# Patient Record
Sex: Male | Born: 1955 | Race: White | Hispanic: No | Marital: Married | Smoking: Former smoker
Health system: Southern US, Community
[De-identification: ages and names within clinical notes are randomized; demographics above are authoritative.]

## PROBLEM LIST (undated history)

## (undated) DIAGNOSIS — R112 Nausea with vomiting, unspecified: Secondary | ICD-10-CM

## (undated) DIAGNOSIS — F5104 Psychophysiologic insomnia: Secondary | ICD-10-CM

## (undated) DIAGNOSIS — J209 Acute bronchitis, unspecified: Secondary | ICD-10-CM

## (undated) DIAGNOSIS — Z9889 Other specified postprocedural states: Secondary | ICD-10-CM

## (undated) DIAGNOSIS — C3490 Malignant neoplasm of unspecified part of unspecified bronchus or lung: Secondary | ICD-10-CM

## (undated) DIAGNOSIS — Z923 Personal history of irradiation: Secondary | ICD-10-CM

## (undated) DIAGNOSIS — F32A Depression, unspecified: Secondary | ICD-10-CM

## (undated) DIAGNOSIS — F329 Major depressive disorder, single episode, unspecified: Secondary | ICD-10-CM

## (undated) DIAGNOSIS — F419 Anxiety disorder, unspecified: Secondary | ICD-10-CM

## (undated) DIAGNOSIS — T50905A Adverse effect of unspecified drugs, medicaments and biological substances, initial encounter: Secondary | ICD-10-CM

## (undated) DIAGNOSIS — J449 Chronic obstructive pulmonary disease, unspecified: Secondary | ICD-10-CM

## (undated) DIAGNOSIS — IMO0002 Reserved for concepts with insufficient information to code with codable children: Secondary | ICD-10-CM

## (undated) DIAGNOSIS — Z9289 Personal history of other medical treatment: Secondary | ICD-10-CM

## (undated) DIAGNOSIS — G4733 Obstructive sleep apnea (adult) (pediatric): Secondary | ICD-10-CM

## (undated) DIAGNOSIS — I2510 Atherosclerotic heart disease of native coronary artery without angina pectoris: Secondary | ICD-10-CM

## (undated) DIAGNOSIS — I10 Essential (primary) hypertension: Secondary | ICD-10-CM

## (undated) DIAGNOSIS — L298 Other pruritus: Secondary | ICD-10-CM

## (undated) DIAGNOSIS — E785 Hyperlipidemia, unspecified: Secondary | ICD-10-CM

## (undated) DIAGNOSIS — I739 Peripheral vascular disease, unspecified: Secondary | ICD-10-CM

## (undated) DIAGNOSIS — K219 Gastro-esophageal reflux disease without esophagitis: Secondary | ICD-10-CM

## (undated) DIAGNOSIS — E119 Type 2 diabetes mellitus without complications: Secondary | ICD-10-CM

## (undated) HISTORY — PX: BACK SURGERY: SHX140

## (undated) HISTORY — DX: Depression, unspecified: F32.A

## (undated) HISTORY — DX: Anxiety disorder, unspecified: F41.9

## (undated) HISTORY — DX: Obstructive sleep apnea (adult) (pediatric): G47.33

## (undated) HISTORY — DX: Gastro-esophageal reflux disease without esophagitis: K21.9

## (undated) HISTORY — DX: Major depressive disorder, single episode, unspecified: F32.9

## (undated) HISTORY — DX: Nausea with vomiting, unspecified: R11.2

## (undated) HISTORY — DX: Type 2 diabetes mellitus without complications: E11.9

## (undated) HISTORY — PX: OTHER SURGICAL HISTORY: SHX169

## (undated) HISTORY — DX: Atherosclerotic heart disease of native coronary artery without angina pectoris: I25.10

## (undated) HISTORY — DX: Chronic obstructive pulmonary disease, unspecified: J44.9

## (undated) HISTORY — DX: Personal history of other medical treatment: Z92.89

## (undated) HISTORY — DX: Adverse effect of unspecified drugs, medicaments and biological substances, initial encounter: T50.905A

## (undated) HISTORY — DX: Peripheral vascular disease, unspecified: I73.9

## (undated) HISTORY — PX: SHOULDER SURGERY: SHX246

## (undated) HISTORY — DX: Other pruritus: L29.8

## (undated) HISTORY — DX: Reserved for concepts with insufficient information to code with codable children: IMO0002

## (undated) HISTORY — DX: Psychophysiologic insomnia: F51.04

## (undated) HISTORY — DX: Other specified postprocedural states: Z98.890

## (undated) HISTORY — DX: Acute bronchitis, unspecified: J20.9

## (undated) HISTORY — DX: Hyperlipidemia, unspecified: E78.5

## (undated) HISTORY — DX: Personal history of irradiation: Z92.3

## (undated) HISTORY — PX: ANGIOPLASTY: SHX39

## (undated) HISTORY — PX: ANTERIOR FUSION CERVICAL SPINE: SUR626

## (undated) HISTORY — PX: HIP SURGERY: SHX245

## (undated) HISTORY — PX: COLON SURGERY: SHX602

---

## 1998-07-07 ENCOUNTER — Ambulatory Visit (HOSPITAL_COMMUNITY): Admission: RE | Admit: 1998-07-07 | Discharge: 1998-07-07 | Payer: Self-pay | Admitting: Endocrinology

## 1998-07-12 ENCOUNTER — Ambulatory Visit (HOSPITAL_COMMUNITY): Admission: RE | Admit: 1998-07-12 | Discharge: 1998-07-12 | Payer: Self-pay | Admitting: Endocrinology

## 1999-01-18 ENCOUNTER — Encounter: Payer: Self-pay | Admitting: Endocrinology

## 1999-01-18 ENCOUNTER — Ambulatory Visit (HOSPITAL_COMMUNITY): Admission: RE | Admit: 1999-01-18 | Discharge: 1999-01-18 | Payer: Self-pay | Admitting: Endocrinology

## 1999-02-20 ENCOUNTER — Encounter: Payer: Self-pay | Admitting: Emergency Medicine

## 1999-02-20 ENCOUNTER — Emergency Department (HOSPITAL_COMMUNITY): Admission: EM | Admit: 1999-02-20 | Discharge: 1999-02-20 | Payer: Self-pay | Admitting: Emergency Medicine

## 1999-05-01 ENCOUNTER — Ambulatory Visit (HOSPITAL_COMMUNITY): Admission: RE | Admit: 1999-05-01 | Discharge: 1999-05-01 | Payer: Self-pay | Admitting: Gastroenterology

## 1999-07-04 ENCOUNTER — Encounter: Payer: Self-pay | Admitting: Gastroenterology

## 1999-07-04 ENCOUNTER — Ambulatory Visit (HOSPITAL_COMMUNITY): Admission: RE | Admit: 1999-07-04 | Discharge: 1999-07-04 | Payer: Self-pay | Admitting: Gastroenterology

## 1999-07-10 ENCOUNTER — Ambulatory Visit (HOSPITAL_COMMUNITY): Admission: RE | Admit: 1999-07-10 | Discharge: 1999-07-10 | Payer: Self-pay | Admitting: Gastroenterology

## 1999-07-10 ENCOUNTER — Encounter: Payer: Self-pay | Admitting: Gastroenterology

## 2000-03-05 ENCOUNTER — Ambulatory Visit (HOSPITAL_COMMUNITY): Admission: RE | Admit: 2000-03-05 | Discharge: 2000-03-05 | Payer: Self-pay | Admitting: Gastroenterology

## 2000-03-05 ENCOUNTER — Encounter: Payer: Self-pay | Admitting: Gastroenterology

## 2001-05-27 ENCOUNTER — Ambulatory Visit (HOSPITAL_COMMUNITY): Admission: RE | Admit: 2001-05-27 | Discharge: 2001-05-27 | Payer: Self-pay | Admitting: Gastroenterology

## 2001-05-29 ENCOUNTER — Ambulatory Visit (HOSPITAL_COMMUNITY): Admission: RE | Admit: 2001-05-29 | Discharge: 2001-05-29 | Payer: Self-pay | Admitting: Gastroenterology

## 2001-05-29 ENCOUNTER — Encounter: Payer: Self-pay | Admitting: Gastroenterology

## 2001-12-23 ENCOUNTER — Emergency Department (HOSPITAL_COMMUNITY): Admission: EM | Admit: 2001-12-23 | Discharge: 2001-12-23 | Payer: Self-pay | Admitting: Emergency Medicine

## 2002-03-30 ENCOUNTER — Encounter: Payer: Self-pay | Admitting: Gastroenterology

## 2002-03-30 ENCOUNTER — Encounter: Admission: RE | Admit: 2002-03-30 | Discharge: 2002-03-30 | Payer: Self-pay | Admitting: Gastroenterology

## 2002-04-13 ENCOUNTER — Ambulatory Visit (HOSPITAL_COMMUNITY): Admission: RE | Admit: 2002-04-13 | Discharge: 2002-04-13 | Payer: Self-pay | Admitting: Gastroenterology

## 2002-05-07 ENCOUNTER — Inpatient Hospital Stay (HOSPITAL_COMMUNITY): Admission: RE | Admit: 2002-05-07 | Discharge: 2002-05-11 | Payer: Self-pay

## 2002-05-07 ENCOUNTER — Encounter (INDEPENDENT_AMBULATORY_CARE_PROVIDER_SITE_OTHER): Payer: Self-pay | Admitting: Specialist

## 2002-09-29 ENCOUNTER — Emergency Department (HOSPITAL_COMMUNITY): Admission: EM | Admit: 2002-09-29 | Discharge: 2002-09-29 | Payer: Self-pay | Admitting: Emergency Medicine

## 2003-09-13 ENCOUNTER — Ambulatory Visit (HOSPITAL_COMMUNITY): Admission: RE | Admit: 2003-09-13 | Discharge: 2003-09-13 | Payer: Self-pay | Admitting: Internal Medicine

## 2003-09-13 ENCOUNTER — Encounter: Payer: Self-pay | Admitting: Internal Medicine

## 2003-09-14 ENCOUNTER — Emergency Department (HOSPITAL_COMMUNITY): Admission: EM | Admit: 2003-09-14 | Discharge: 2003-09-14 | Payer: Self-pay | Admitting: Emergency Medicine

## 2003-10-10 ENCOUNTER — Ambulatory Visit (HOSPITAL_COMMUNITY): Admission: RE | Admit: 2003-10-10 | Discharge: 2003-10-11 | Payer: Self-pay | Admitting: Neurological Surgery

## 2004-10-04 ENCOUNTER — Ambulatory Visit: Payer: Self-pay | Admitting: Gastroenterology

## 2004-10-06 ENCOUNTER — Ambulatory Visit (HOSPITAL_COMMUNITY): Admission: RE | Admit: 2004-10-06 | Discharge: 2004-10-06 | Payer: Self-pay | Admitting: Neurological Surgery

## 2004-10-12 ENCOUNTER — Encounter (INDEPENDENT_AMBULATORY_CARE_PROVIDER_SITE_OTHER): Payer: Self-pay | Admitting: *Deleted

## 2004-10-12 ENCOUNTER — Ambulatory Visit (HOSPITAL_COMMUNITY): Admission: RE | Admit: 2004-10-12 | Discharge: 2004-10-12 | Payer: Self-pay | Admitting: Gastroenterology

## 2004-10-12 ENCOUNTER — Ambulatory Visit: Payer: Self-pay | Admitting: Gastroenterology

## 2005-03-05 ENCOUNTER — Ambulatory Visit: Payer: Self-pay | Admitting: Internal Medicine

## 2005-03-13 ENCOUNTER — Ambulatory Visit: Payer: Self-pay | Admitting: Endocrinology

## 2005-03-20 ENCOUNTER — Ambulatory Visit: Payer: Self-pay | Admitting: Endocrinology

## 2005-03-27 ENCOUNTER — Ambulatory Visit: Payer: Self-pay | Admitting: Endocrinology

## 2005-06-14 ENCOUNTER — Ambulatory Visit: Payer: Self-pay | Admitting: Endocrinology

## 2006-02-10 ENCOUNTER — Emergency Department (HOSPITAL_COMMUNITY): Admission: EM | Admit: 2006-02-10 | Discharge: 2006-02-10 | Payer: Self-pay | Admitting: Emergency Medicine

## 2006-02-14 ENCOUNTER — Ambulatory Visit: Payer: Self-pay | Admitting: Cardiology

## 2006-02-17 ENCOUNTER — Inpatient Hospital Stay (HOSPITAL_BASED_OUTPATIENT_CLINIC_OR_DEPARTMENT_OTHER): Admission: RE | Admit: 2006-02-17 | Discharge: 2006-02-17 | Payer: Self-pay | Admitting: Cardiovascular Disease

## 2006-02-17 ENCOUNTER — Ambulatory Visit: Payer: Self-pay | Admitting: Cardiovascular Disease

## 2006-02-20 ENCOUNTER — Ambulatory Visit: Payer: Self-pay | Admitting: Cardiology

## 2006-02-20 ENCOUNTER — Ambulatory Visit (HOSPITAL_COMMUNITY): Admission: RE | Admit: 2006-02-20 | Discharge: 2006-02-20 | Payer: Self-pay | Admitting: Cardiology

## 2006-02-26 ENCOUNTER — Ambulatory Visit: Payer: Self-pay | Admitting: Cardiology

## 2006-03-03 ENCOUNTER — Ambulatory Visit: Payer: Self-pay | Admitting: Internal Medicine

## 2006-03-07 ENCOUNTER — Ambulatory Visit: Payer: Self-pay

## 2006-03-12 ENCOUNTER — Ambulatory Visit: Payer: Self-pay | Admitting: *Deleted

## 2006-03-17 ENCOUNTER — Ambulatory Visit: Payer: Self-pay | Admitting: Cardiology

## 2006-03-19 ENCOUNTER — Ambulatory Visit: Payer: Self-pay | Admitting: Internal Medicine

## 2006-03-20 ENCOUNTER — Ambulatory Visit: Payer: Self-pay | Admitting: Internal Medicine

## 2006-03-26 ENCOUNTER — Ambulatory Visit: Payer: Self-pay | Admitting: Internal Medicine

## 2006-04-01 ENCOUNTER — Ambulatory Visit: Payer: Self-pay | Admitting: Internal Medicine

## 2006-04-02 ENCOUNTER — Ambulatory Visit: Payer: Self-pay | Admitting: Cardiology

## 2006-04-09 ENCOUNTER — Ambulatory Visit: Payer: Self-pay | Admitting: Internal Medicine

## 2006-04-10 ENCOUNTER — Ambulatory Visit: Payer: Self-pay | Admitting: Cardiology

## 2006-05-01 ENCOUNTER — Ambulatory Visit: Payer: Self-pay | Admitting: Internal Medicine

## 2006-06-16 ENCOUNTER — Ambulatory Visit: Payer: Self-pay | Admitting: Internal Medicine

## 2006-06-17 ENCOUNTER — Ambulatory Visit: Payer: Self-pay | Admitting: Internal Medicine

## 2006-07-17 ENCOUNTER — Ambulatory Visit: Payer: Self-pay | Admitting: Internal Medicine

## 2006-08-28 ENCOUNTER — Ambulatory Visit: Payer: Self-pay | Admitting: Internal Medicine

## 2006-08-29 ENCOUNTER — Ambulatory Visit (HOSPITAL_COMMUNITY): Admission: RE | Admit: 2006-08-29 | Discharge: 2006-08-29 | Payer: Self-pay | Admitting: Internal Medicine

## 2006-09-05 ENCOUNTER — Ambulatory Visit: Payer: Self-pay

## 2006-09-05 ENCOUNTER — Ambulatory Visit: Payer: Self-pay | Admitting: Cardiology

## 2006-10-14 ENCOUNTER — Ambulatory Visit: Payer: Self-pay | Admitting: Internal Medicine

## 2006-11-10 ENCOUNTER — Ambulatory Visit: Payer: Self-pay | Admitting: Internal Medicine

## 2006-11-12 ENCOUNTER — Encounter (INDEPENDENT_AMBULATORY_CARE_PROVIDER_SITE_OTHER): Payer: Self-pay | Admitting: Specialist

## 2006-11-12 ENCOUNTER — Ambulatory Visit (HOSPITAL_BASED_OUTPATIENT_CLINIC_OR_DEPARTMENT_OTHER): Admission: RE | Admit: 2006-11-12 | Discharge: 2006-11-12 | Payer: Self-pay | Admitting: Orthopedic Surgery

## 2006-11-20 ENCOUNTER — Ambulatory Visit (HOSPITAL_COMMUNITY): Admission: RE | Admit: 2006-11-20 | Discharge: 2006-11-21 | Payer: Self-pay | Admitting: Orthopaedic Surgery

## 2006-12-01 ENCOUNTER — Ambulatory Visit: Payer: Self-pay | Admitting: Cardiology

## 2007-03-05 ENCOUNTER — Inpatient Hospital Stay (HOSPITAL_COMMUNITY): Admission: RE | Admit: 2007-03-05 | Discharge: 2007-03-10 | Payer: Self-pay | Admitting: Neurological Surgery

## 2007-03-05 HISTORY — PX: SPINAL FUSION: SHX223

## 2007-04-07 ENCOUNTER — Ambulatory Visit: Payer: Self-pay | Admitting: Internal Medicine

## 2007-04-07 LAB — CONVERTED CEMR LAB
ALT: 39 units/L (ref 0–40)
AST: 28 units/L (ref 0–37)
Albumin: 3.8 g/dL (ref 3.5–5.2)
Alkaline Phosphatase: 113 units/L (ref 39–117)
BUN: 7 mg/dL (ref 6–23)
Bilirubin, Direct: 0.1 mg/dL (ref 0.0–0.3)
CO2: 26 meq/L (ref 19–32)
Calcium: 9.3 mg/dL (ref 8.4–10.5)
Chloride: 102 meq/L (ref 96–112)
Cholesterol: 270 mg/dL (ref 0–200)
Creatinine, Ser: 0.9 mg/dL (ref 0.4–1.5)
Direct LDL: 140.8 mg/dL
GFR calc Af Amer: 114 mL/min
GFR calc non Af Amer: 94 mL/min
Glucose, Bld: 134 mg/dL — ABNORMAL HIGH (ref 70–99)
HDL: 39.6 mg/dL (ref >39.0)
Hgb A1c MFr Bld: 5.7 % (ref 4.6–6.0)
Potassium: 4 meq/L (ref 3.5–5.1)
Sodium: 138 meq/L (ref 135–145)
Total Bilirubin: 0.6 mg/dL (ref 0.3–1.2)
Total CHOL/HDL Ratio: 6.8
Total Protein: 6.3 g/dL (ref 6.0–8.3)
Triglycerides: 607 mg/dL (ref 0–149)
VLDL: 121 mg/dL — ABNORMAL HIGH (ref 0–40)

## 2007-04-13 ENCOUNTER — Ambulatory Visit: Payer: Self-pay | Admitting: Internal Medicine

## 2007-05-18 ENCOUNTER — Ambulatory Visit: Payer: Self-pay | Admitting: Internal Medicine

## 2007-05-18 LAB — CONVERTED CEMR LAB
ALT: 34 units/L (ref 0–40)
AST: 28 units/L (ref 0–37)
Albumin: 3.8 g/dL (ref 3.5–5.2)
Alkaline Phosphatase: 89 units/L (ref 39–117)
BUN: 7 mg/dL (ref 6–23)
Bilirubin, Direct: 0.1 mg/dL (ref 0.0–0.3)
CO2: 28 meq/L (ref 19–32)
Calcium: 9.2 mg/dL (ref 8.4–10.5)
Chloride: 111 meq/L (ref 96–112)
Cholesterol: 212 mg/dL (ref 0–200)
Creatinine, Ser: 0.9 mg/dL (ref 0.4–1.5)
Direct LDL: 100.3 mg/dL
GFR calc Af Amer: 114 mL/min
GFR calc non Af Amer: 94 mL/min
Glucose, Bld: 119 mg/dL — ABNORMAL HIGH (ref 70–99)
HDL: 54.2 mg/dL (ref >39.0)
Potassium: 3.9 meq/L (ref 3.5–5.1)
Sodium: 142 meq/L (ref 135–145)
TSH: 2.04 microintl units/mL (ref 0.35–5.50)
Total Bilirubin: 0.5 mg/dL (ref 0.3–1.2)
Total CHOL/HDL Ratio: 3.9
Total Protein: 6.4 g/dL (ref 6.0–8.3)
Triglycerides: 420 mg/dL (ref 0–149)
VLDL: 84 mg/dL — ABNORMAL HIGH (ref 0–40)

## 2007-05-25 ENCOUNTER — Ambulatory Visit: Payer: Self-pay | Admitting: Internal Medicine

## 2007-06-04 ENCOUNTER — Inpatient Hospital Stay (HOSPITAL_COMMUNITY): Admission: EM | Admit: 2007-06-04 | Discharge: 2007-06-06 | Payer: Self-pay | Admitting: Emergency Medicine

## 2007-06-04 ENCOUNTER — Ambulatory Visit: Payer: Self-pay | Admitting: Internal Medicine

## 2007-06-05 ENCOUNTER — Ambulatory Visit: Payer: Self-pay | Admitting: Internal Medicine

## 2007-06-06 ENCOUNTER — Encounter (INDEPENDENT_AMBULATORY_CARE_PROVIDER_SITE_OTHER): Payer: Self-pay | Admitting: Gastroenterology

## 2007-06-12 ENCOUNTER — Ambulatory Visit: Payer: Self-pay | Admitting: Internal Medicine

## 2007-07-07 ENCOUNTER — Ambulatory Visit: Payer: Self-pay | Admitting: Internal Medicine

## 2007-07-15 ENCOUNTER — Ambulatory Visit: Payer: Self-pay | Admitting: Internal Medicine

## 2007-07-15 LAB — CONVERTED CEMR LAB
CO2: 26 meq/L (ref 19–32)
Cholesterol: 161 mg/dL (ref 0–200)
Creatinine, Ser: 0.9 mg/dL (ref 0.4–1.5)
Direct LDL: 88.1 mg/dL
Glucose, Bld: 138 mg/dL — ABNORMAL HIGH (ref 70–99)
HDL: 41.3 mg/dL (ref >39.0)
Hgb A1c MFr Bld: 5.4 % (ref 4.6–6.0)
Potassium: 3.2 meq/L — ABNORMAL LOW (ref 3.5–5.1)
Sodium: 140 meq/L (ref 135–145)
Total Bilirubin: 1 mg/dL (ref 0.3–1.2)
Total Protein: 6.2 g/dL (ref 6.0–8.3)

## 2007-07-31 ENCOUNTER — Ambulatory Visit: Payer: Self-pay | Admitting: Internal Medicine

## 2007-07-31 LAB — CONVERTED CEMR LAB
CO2: 29 meq/L (ref 19–32)
Calcium: 9.5 mg/dL (ref 8.4–10.5)
Chloride: 101 meq/L (ref 96–112)
GFR calc non Af Amer: 94 mL/min
Glucose, Bld: 111 mg/dL — ABNORMAL HIGH (ref 70–99)

## 2007-08-04 ENCOUNTER — Ambulatory Visit: Payer: Self-pay | Admitting: Vascular Surgery

## 2007-09-04 ENCOUNTER — Ambulatory Visit: Payer: Self-pay | Admitting: Internal Medicine

## 2007-09-08 DIAGNOSIS — J449 Chronic obstructive pulmonary disease, unspecified: Secondary | ICD-10-CM

## 2007-09-08 DIAGNOSIS — K219 Gastro-esophageal reflux disease without esophagitis: Secondary | ICD-10-CM | POA: Insufficient documentation

## 2007-09-08 DIAGNOSIS — J4489 Other specified chronic obstructive pulmonary disease: Secondary | ICD-10-CM

## 2007-09-08 DIAGNOSIS — R05 Cough: Secondary | ICD-10-CM

## 2007-09-08 DIAGNOSIS — I10 Essential (primary) hypertension: Secondary | ICD-10-CM

## 2007-09-08 DIAGNOSIS — R911 Solitary pulmonary nodule: Secondary | ICD-10-CM

## 2007-09-08 DIAGNOSIS — R059 Cough, unspecified: Secondary | ICD-10-CM | POA: Insufficient documentation

## 2007-09-08 DIAGNOSIS — M109 Gout, unspecified: Secondary | ICD-10-CM | POA: Insufficient documentation

## 2007-09-08 DIAGNOSIS — Z9189 Other specified personal risk factors, not elsewhere classified: Secondary | ICD-10-CM

## 2007-09-08 DIAGNOSIS — F418 Other specified anxiety disorders: Secondary | ICD-10-CM | POA: Insufficient documentation

## 2007-09-08 HISTORY — DX: Other specified chronic obstructive pulmonary disease: J44.89

## 2007-09-08 HISTORY — DX: Chronic obstructive pulmonary disease, unspecified: J44.9

## 2007-09-22 ENCOUNTER — Ambulatory Visit (HOSPITAL_BASED_OUTPATIENT_CLINIC_OR_DEPARTMENT_OTHER): Admission: RE | Admit: 2007-09-22 | Discharge: 2007-09-22 | Payer: Self-pay | Admitting: Internal Medicine

## 2007-10-06 ENCOUNTER — Ambulatory Visit: Payer: Self-pay | Admitting: Pulmonary Disease

## 2007-11-16 ENCOUNTER — Ambulatory Visit: Payer: Self-pay | Admitting: Internal Medicine

## 2007-11-16 DIAGNOSIS — Z720 Tobacco use: Secondary | ICD-10-CM

## 2007-12-14 ENCOUNTER — Ambulatory Visit: Payer: Self-pay | Admitting: Internal Medicine

## 2007-12-14 DIAGNOSIS — J40 Bronchitis, not specified as acute or chronic: Secondary | ICD-10-CM

## 2008-01-28 ENCOUNTER — Encounter: Payer: Self-pay | Admitting: Internal Medicine

## 2008-03-31 ENCOUNTER — Ambulatory Visit: Payer: Self-pay | Admitting: Cardiology

## 2008-04-05 ENCOUNTER — Encounter: Payer: Self-pay | Admitting: Internal Medicine

## 2008-04-05 ENCOUNTER — Ambulatory Visit: Payer: Self-pay

## 2008-04-05 LAB — CONVERTED CEMR LAB
ALT: 31 units/L (ref 0–53)
Albumin: 3.6 g/dL (ref 3.5–5.2)
Cholesterol: 203 mg/dL (ref 0–200)
Direct LDL: 129.9 mg/dL
HDL: 29.7 mg/dL — ABNORMAL LOW (ref >39.0)
Total Bilirubin: 0.9 mg/dL (ref 0.3–1.2)
Total CHOL/HDL Ratio: 6.8
Triglycerides: 314 mg/dL (ref 0–149)

## 2008-04-08 ENCOUNTER — Encounter: Payer: Self-pay | Admitting: Internal Medicine

## 2008-04-16 ENCOUNTER — Encounter: Admission: RE | Admit: 2008-04-16 | Discharge: 2008-04-16 | Payer: Self-pay | Admitting: Neurological Surgery

## 2008-04-21 ENCOUNTER — Encounter: Payer: Self-pay | Admitting: Internal Medicine

## 2008-05-16 ENCOUNTER — Ambulatory Visit: Payer: Self-pay | Admitting: Internal Medicine

## 2008-05-19 ENCOUNTER — Ambulatory Visit: Payer: Self-pay | Admitting: Cardiology

## 2008-05-26 ENCOUNTER — Ambulatory Visit: Payer: Self-pay | Admitting: Internal Medicine

## 2008-06-27 ENCOUNTER — Ambulatory Visit: Payer: Self-pay | Admitting: Cardiology

## 2008-06-27 LAB — CONVERTED CEMR LAB
ALT: 24 units/L (ref 0–53)
AST: 25 units/L (ref 0–37)
Bilirubin, Direct: 0.1 mg/dL (ref 0.0–0.3)
Cholesterol: 142 mg/dL (ref 0–200)
Total Protein: 6.6 g/dL (ref 6.0–8.3)
VLDL: 37 mg/dL (ref 0–40)

## 2008-07-04 ENCOUNTER — Ambulatory Visit: Payer: Self-pay | Admitting: Cardiovascular Disease

## 2008-07-14 ENCOUNTER — Ambulatory Visit: Payer: Self-pay | Admitting: Internal Medicine

## 2008-07-20 ENCOUNTER — Ambulatory Visit: Payer: Self-pay | Admitting: Cardiovascular Disease

## 2008-07-20 ENCOUNTER — Observation Stay (HOSPITAL_COMMUNITY): Admission: RE | Admit: 2008-07-20 | Discharge: 2008-07-20 | Payer: Self-pay | Admitting: Cardiovascular Disease

## 2008-09-02 ENCOUNTER — Ambulatory Visit: Payer: Self-pay

## 2008-09-05 ENCOUNTER — Ambulatory Visit: Payer: Self-pay

## 2008-09-05 ENCOUNTER — Ambulatory Visit: Payer: Self-pay | Admitting: Cardiovascular Disease

## 2008-10-03 ENCOUNTER — Encounter: Payer: Self-pay | Admitting: Internal Medicine

## 2008-10-04 ENCOUNTER — Encounter: Payer: Self-pay | Admitting: Internal Medicine

## 2008-10-28 ENCOUNTER — Encounter: Payer: Self-pay | Admitting: Internal Medicine

## 2008-11-03 ENCOUNTER — Ambulatory Visit: Payer: Self-pay | Admitting: Cardiology

## 2008-11-03 LAB — CONVERTED CEMR LAB
ALT: 32 units/L (ref 0–53)
AST: 28 units/L (ref 0–37)
Alkaline Phosphatase: 91 units/L (ref 39–117)
Bilirubin, Direct: 0.1 mg/dL (ref 0.0–0.3)
Total Bilirubin: 0.7 mg/dL (ref 0.3–1.2)
Total CHOL/HDL Ratio: 4.1
Triglycerides: 229 mg/dL (ref 0–149)

## 2008-11-07 ENCOUNTER — Ambulatory Visit: Payer: Self-pay | Admitting: Cardiology

## 2008-11-22 ENCOUNTER — Ambulatory Visit: Payer: Self-pay | Admitting: Cardiovascular Disease

## 2008-11-22 ENCOUNTER — Ambulatory Visit: Payer: Self-pay | Admitting: Internal Medicine

## 2008-11-23 ENCOUNTER — Encounter: Payer: Self-pay | Admitting: Internal Medicine

## 2008-12-07 ENCOUNTER — Ambulatory Visit: Payer: Self-pay | Admitting: Cardiology

## 2008-12-09 ENCOUNTER — Ambulatory Visit: Payer: Self-pay

## 2008-12-09 ENCOUNTER — Encounter: Payer: Self-pay | Admitting: Internal Medicine

## 2008-12-31 ENCOUNTER — Emergency Department (HOSPITAL_COMMUNITY): Admission: EM | Admit: 2008-12-31 | Discharge: 2008-12-31 | Payer: Self-pay | Admitting: Emergency Medicine

## 2009-01-05 ENCOUNTER — Ambulatory Visit (HOSPITAL_COMMUNITY): Admission: RE | Admit: 2009-01-05 | Discharge: 2009-01-06 | Payer: Self-pay | Admitting: Neurological Surgery

## 2009-01-17 ENCOUNTER — Encounter: Payer: Self-pay | Admitting: Internal Medicine

## 2009-01-17 ENCOUNTER — Telehealth (INDEPENDENT_AMBULATORY_CARE_PROVIDER_SITE_OTHER): Payer: Self-pay | Admitting: *Deleted

## 2009-01-17 ENCOUNTER — Ambulatory Visit: Payer: Self-pay | Admitting: Internal Medicine

## 2009-01-27 ENCOUNTER — Encounter: Payer: Self-pay | Admitting: Internal Medicine

## 2009-02-20 ENCOUNTER — Ambulatory Visit: Payer: Self-pay | Admitting: Cardiology

## 2009-03-01 ENCOUNTER — Encounter: Payer: Self-pay | Admitting: Internal Medicine

## 2009-03-30 ENCOUNTER — Encounter: Admission: RE | Admit: 2009-03-30 | Discharge: 2009-03-30 | Payer: Self-pay | Admitting: Neurological Surgery

## 2009-04-05 ENCOUNTER — Encounter: Payer: Self-pay | Admitting: Internal Medicine

## 2009-05-16 ENCOUNTER — Ambulatory Visit: Payer: Self-pay | Admitting: Cardiology

## 2009-05-16 DIAGNOSIS — E785 Hyperlipidemia, unspecified: Secondary | ICD-10-CM

## 2009-05-17 ENCOUNTER — Encounter: Payer: Self-pay | Admitting: Cardiology

## 2009-05-17 LAB — CONVERTED CEMR LAB
Bilirubin, Direct: 0.1 mg/dL (ref 0.0–0.3)
Direct LDL: 90.2 mg/dL
HDL: 35.6 mg/dL — ABNORMAL LOW (ref >39.00)
Total Bilirubin: 0.8 mg/dL (ref 0.3–1.2)
Total CHOL/HDL Ratio: 4
Triglycerides: 328 mg/dL — ABNORMAL HIGH (ref 0.0–149.0)
VLDL: 65.6 mg/dL — ABNORMAL HIGH (ref 0.0–40.0)

## 2009-05-19 ENCOUNTER — Ambulatory Visit: Payer: Self-pay | Admitting: Internal Medicine

## 2009-05-22 ENCOUNTER — Ambulatory Visit: Payer: Self-pay | Admitting: Cardiology

## 2009-06-08 ENCOUNTER — Encounter: Admission: RE | Admit: 2009-06-08 | Discharge: 2009-06-08 | Payer: Self-pay | Admitting: Anesthesiology

## 2009-07-13 ENCOUNTER — Ambulatory Visit: Payer: Self-pay | Admitting: Cardiology

## 2009-07-20 ENCOUNTER — Encounter: Admission: RE | Admit: 2009-07-20 | Discharge: 2009-07-20 | Payer: Self-pay | Admitting: Anesthesiology

## 2009-08-30 ENCOUNTER — Encounter: Payer: Self-pay | Admitting: Internal Medicine

## 2009-09-08 ENCOUNTER — Encounter: Payer: Self-pay | Admitting: Internal Medicine

## 2009-12-19 ENCOUNTER — Ambulatory Visit: Payer: Self-pay | Admitting: Internal Medicine

## 2010-04-19 ENCOUNTER — Telehealth: Payer: Self-pay | Admitting: Internal Medicine

## 2010-05-01 ENCOUNTER — Ambulatory Visit: Payer: Self-pay | Admitting: Internal Medicine

## 2010-05-02 ENCOUNTER — Telehealth: Payer: Self-pay | Admitting: Adult Health

## 2010-05-15 ENCOUNTER — Ambulatory Visit: Payer: Self-pay | Admitting: Internal Medicine

## 2010-05-15 ENCOUNTER — Encounter: Payer: Self-pay | Admitting: Cardiology

## 2010-05-15 DIAGNOSIS — I739 Peripheral vascular disease, unspecified: Secondary | ICD-10-CM

## 2010-05-16 ENCOUNTER — Encounter: Payer: Self-pay | Admitting: Internal Medicine

## 2010-06-08 ENCOUNTER — Encounter: Payer: Self-pay | Admitting: Cardiology

## 2010-06-12 ENCOUNTER — Encounter: Payer: Self-pay | Admitting: Internal Medicine

## 2010-06-12 ENCOUNTER — Ambulatory Visit: Payer: Self-pay | Admitting: Cardiology

## 2010-06-12 DIAGNOSIS — R079 Chest pain, unspecified: Secondary | ICD-10-CM | POA: Insufficient documentation

## 2010-07-05 ENCOUNTER — Telehealth (INDEPENDENT_AMBULATORY_CARE_PROVIDER_SITE_OTHER): Payer: Self-pay | Admitting: *Deleted

## 2010-07-05 ENCOUNTER — Encounter: Payer: Self-pay | Admitting: Cardiology

## 2010-07-09 ENCOUNTER — Encounter: Payer: Self-pay | Admitting: Cardiology

## 2010-07-09 ENCOUNTER — Ambulatory Visit: Payer: Self-pay | Admitting: Cardiology

## 2010-07-09 ENCOUNTER — Ambulatory Visit: Payer: Self-pay

## 2010-07-09 ENCOUNTER — Encounter (HOSPITAL_COMMUNITY): Admission: RE | Admit: 2010-07-09 | Discharge: 2010-08-21 | Payer: Self-pay | Admitting: Cardiology

## 2010-07-16 ENCOUNTER — Ambulatory Visit: Payer: Self-pay | Admitting: Cardiology

## 2010-07-17 ENCOUNTER — Ambulatory Visit: Payer: Self-pay | Admitting: Cardiology

## 2010-07-17 ENCOUNTER — Encounter: Payer: Self-pay | Admitting: Internal Medicine

## 2010-07-17 ENCOUNTER — Inpatient Hospital Stay (HOSPITAL_BASED_OUTPATIENT_CLINIC_OR_DEPARTMENT_OTHER): Admission: RE | Admit: 2010-07-17 | Discharge: 2010-07-17 | Payer: Self-pay | Admitting: Cardiology

## 2010-07-17 LAB — CONVERTED CEMR LAB
Basophils Absolute: 0 10*3/uL (ref 0.0–0.1)
Basophils Relative: 0.3 % (ref 0.0–3.0)
CO2: 27 meq/L (ref 19–32)
Calcium: 9.3 mg/dL (ref 8.4–10.5)
Eosinophils Absolute: 0.3 10*3/uL (ref 0.0–0.7)
Glucose, Bld: 107 mg/dL — ABNORMAL HIGH (ref 70–99)
HCT: 48.3 % (ref 39.0–52.0)
Hemoglobin: 16.7 g/dL (ref 13.0–17.0)
INR: 0.9 (ref 0.8–1.0)
Lymphocytes Relative: 21 % (ref 12.0–46.0)
Lymphs Abs: 2.4 10*3/uL (ref 0.7–4.0)
MCHC: 34.7 g/dL (ref 30.0–36.0)
Monocytes Relative: 7.9 % (ref 3.0–12.0)
Neutro Abs: 7.7 10*3/uL (ref 1.4–7.7)
Potassium: 4.1 meq/L (ref 3.5–5.1)
RBC: 5.26 M/uL (ref 4.22–5.81)
RDW: 14.6 % (ref 11.5–14.6)
Sodium: 142 meq/L (ref 135–145)

## 2010-07-20 ENCOUNTER — Telehealth: Payer: Self-pay | Admitting: Internal Medicine

## 2010-07-26 ENCOUNTER — Telehealth: Payer: Self-pay | Admitting: Cardiology

## 2010-08-08 ENCOUNTER — Ambulatory Visit: Payer: Self-pay | Admitting: Cardiology

## 2010-08-08 LAB — CONVERTED CEMR LAB
ALT: 21 units/L (ref 0–53)
AST: 19 units/L (ref 0–37)
Alkaline Phosphatase: 103 units/L (ref 39–117)
Total Bilirubin: 0.5 mg/dL (ref 0.3–1.2)
Total CHOL/HDL Ratio: 5
VLDL: 82.4 mg/dL — ABNORMAL HIGH (ref 0.0–40.0)

## 2010-08-09 ENCOUNTER — Encounter: Payer: Self-pay | Admitting: Cardiology

## 2010-08-13 ENCOUNTER — Ambulatory Visit: Payer: Self-pay | Admitting: Cardiology

## 2010-08-14 ENCOUNTER — Ambulatory Visit: Payer: Self-pay | Admitting: Internal Medicine

## 2010-08-29 ENCOUNTER — Telehealth (INDEPENDENT_AMBULATORY_CARE_PROVIDER_SITE_OTHER): Payer: Self-pay | Admitting: *Deleted

## 2010-09-10 ENCOUNTER — Ambulatory Visit (HOSPITAL_BASED_OUTPATIENT_CLINIC_OR_DEPARTMENT_OTHER): Admission: RE | Admit: 2010-09-10 | Discharge: 2010-09-10 | Payer: Self-pay | Admitting: Internal Medicine

## 2010-09-10 ENCOUNTER — Encounter: Payer: Self-pay | Admitting: Internal Medicine

## 2010-09-15 ENCOUNTER — Ambulatory Visit: Payer: Self-pay | Admitting: Internal Medicine

## 2010-09-24 ENCOUNTER — Ambulatory Visit: Payer: Self-pay | Admitting: Internal Medicine

## 2010-09-24 DIAGNOSIS — G4733 Obstructive sleep apnea (adult) (pediatric): Secondary | ICD-10-CM | POA: Insufficient documentation

## 2010-09-28 ENCOUNTER — Encounter: Payer: Self-pay | Admitting: Internal Medicine

## 2010-10-17 ENCOUNTER — Telehealth (INDEPENDENT_AMBULATORY_CARE_PROVIDER_SITE_OTHER): Payer: Self-pay | Admitting: *Deleted

## 2010-10-17 ENCOUNTER — Encounter: Payer: Self-pay | Admitting: Internal Medicine

## 2010-10-28 ENCOUNTER — Encounter: Payer: Self-pay | Admitting: Internal Medicine

## 2010-10-29 ENCOUNTER — Ambulatory Visit: Payer: Self-pay | Admitting: Internal Medicine

## 2010-11-07 ENCOUNTER — Telehealth (INDEPENDENT_AMBULATORY_CARE_PROVIDER_SITE_OTHER): Payer: Self-pay | Admitting: *Deleted

## 2010-11-15 ENCOUNTER — Telehealth (INDEPENDENT_AMBULATORY_CARE_PROVIDER_SITE_OTHER): Payer: Self-pay | Admitting: *Deleted

## 2010-12-16 ENCOUNTER — Encounter: Payer: Self-pay | Admitting: Endocrinology

## 2010-12-24 ENCOUNTER — Telehealth: Payer: Self-pay | Admitting: Internal Medicine

## 2010-12-27 NOTE — Miscellaneous (Signed)
Summary: CPAP Order/[facility]  CPAP Order/[facility]   Imported By: Sherian Rein 10/31/2010 08:23:25  _____________________________________________________________________  External Attachment:    Type:   Image     Comment:   External Document

## 2010-12-27 NOTE — Assessment & Plan Note (Signed)
Summary: NEEDS FORMS FILLED OUT/DT   Vital Signs:  Patient profile:   55 year old male Weight:      189.25 pounds BMI:     29.75 O2 Sat:      96 % on Room air Temp:     97.9 degrees F rectal Pulse rate:   79 / minute Pulse rhythm:   regular Resp:     18 per minute BP sitting:   134 / 70  (right arm) Cuff size:   large  Vitals Entered By: Glendell Docker CMA (May 15, 2010 10:45 AM)  O2 Flow:  Room air CC: Rm 3- forms for [facility] Comments needs form completed for DMV,completed augmentin and tessalon pearls   Primary Care Provider:  [name] DO  CC:  Rm 3- forms for DMV.  History of Present Illness: 55 y/o white male for follow up he requests DMV medical evaluation he reports Dr. Everardo All sent notification to [facility] 10 years ago after he experienced syncopal episode.   never experienced recurrence no arrthymia or seizure discovered  got ticket 1 month ago for expired registration  chronic back pain - takes pain meds as needed ( followed by Dr. Vear Clock )  CAD nonobstructive - Dr. 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  Hehad successful stenting of his left external iliac per Dr. Excell Seltzer.   he reports cutting back on tob use to 4 cig per day  COPD - mild symptoms.  Last PFTs in 2009  Preventive Screening-Counseling & Management  Alcohol-Tobacco     Smoking Status: current  Allergies: 1)  Aspirin (Aspirin) 2)  Codeine Phosphate (Codeine Phosphate)  Past History:  Past Medical History: CAD (Left Main 30% stenosis, LAD 20 - 30 % stenosis, first and second diagonal branchesat 40 - 50% stenosis with small arteries, circumflex had 30% stenosis in the large obtuse marginal, RCA at 70 - 80% stenosis [not felt to be occlusive after evaluation with flow wire], distal 50 - 60% stenosis). Well preserved ejection fraction COPD  Depression / anxiety Hyperlipidemia (followed in lipid clinic) Chronic Insomnia Ongoing tobacco  abuse (1 pack per day) Gout GERD Peripheral vascular disease (ABI 0.9 on right and 0.89 on left)  severe left external iliac stenosis.  He had successful stenting of his left external iliac per Dr. Excell Seltzer.  Degeneraive disk disease  Past Surgical History: S/P Spinal fusion L4-L5  03/05/07 Left hip surgery left arm surgery right shoulder surgery  C- spine surgery   Family History: Father died at age 56 after a noncardiac surgery. Mother died at age 41 of myocardial infarction Sister is alive at age 66, she had myocardial infarction at age 59 has emphysema   Social History: Occupation: Psychologist, occupational (disabled) Current Smoker 4 cig living with wife - married 31 Alcohol use-no Drug use-no He had 66 year old son who died in a car accident several years ago.  Review of Systems       no chest pain,  no dizziness 4 cig per day  Physical Exam  General:  alert, well-developed, and well-nourished.   Head:  normocephalic and atraumatic.   Ears:  R ear normal and L ear normal.   Mouth:  pharynx pink and moist.   Neck:  No deformities, masses, or tenderness noted. Lungs:  normal respiratory effort, normal breath sounds, no crackles, and no wheezes.   Heart:  normal rate, regular rhythm, no murmur, and no gallop.   Abdomen:  soft, non-tender, and normal bowel sounds.   Extremities:  trace left pedal edema and trace right pedal edema.   Neurologic:  cranial nerves II-XII intact, gait normal, and DTRs symmetrical and normal.   Psych:  normally interactive, good eye contact, not anxious appearing, and not depressed appearing.     Impression & Recommendations:  Problem # 1:  OTH GENERAL MEDICAL EXAMINATION ADMIN PURPOSES (ICD-V70.3) 55 y/o for DMV evaluation.  he reports Dr. Everardo All sent notification to [facility] 10 years ago after he experienced syncopal episode.   never experienced recurrence.  no arrthymia or seizure discovered CAD - stable COPD - stable Pt advised to follow up with pain mgt  specialist who can comment on whether pt can safely operate motor vehicle after taking pain medication  Problem # 2:  COPD (ICD-496) Assessment: Unchanged  stable.  arrange f/u PFTs  His updated medication list for this problem includes:    Foradil Aerolizer 12 Mcg Caps (Formoterol fumarate) .[name]... 1 puff two times a day    Pulmicort Flexhaler 180 Mcg/act Inha (Budesonide (inhalation)) .[name]... 1 puff two times a day    Proair Hfa 108 (90 Base) Mcg/act Aers (Albuterol sulfate) .[name]... 2 puffs four times a day as needed rescue  Orders: Pulmonary Referral (Pulmonary)  Problem # 3:  HYPERTENSION (ICD-401.9) stable.  Maintain current medication regimen.  His updated medication list for this problem includes:    Amlodipine Besylate 10 Mg Tabs (Amlodipine besylate) .[name]... Take 1 tablet once a day    Hydrochlorothiazide 25 Mg Tabs (Hydrochlorothiazide) .[name]... Take one tablet by mouth daily.  BP today: 134/70 Prior BP: 144/74 (05/01/2010)  Labs Reviewed: K+: 3.4 (07/31/2007) Creat: : 0.9 (07/31/2007)   Chol: 155 (05/16/2009)   HDL: 35.60 (05/16/2009)   LDL: DEL (11/03/2008)   TG: 328.0 (05/16/2009)  Problem # 4:  UNSPECIFIED PERIPHERAL VASCULAR DISEASE (ICD-443.9) continue risk factor mgt  Complete Medication List: 1)  Amlodipine Besylate 10 Mg Tabs (Amlodipine besylate) .... Take 1 tablet once a day 2)  Simvastatin 40 Mg Tabs (Simvastatin) .... Take 1 tablet by mouth at bedtime 3)  Foradil Aerolizer 12 Mcg Caps (Formoterol fumarate) .[name].. 1 puff two times a day 4)  Pulmicort Flexhaler 180 Mcg/act Inha (Budesonide (inhalation)) .[name].. 1 puff two times a day 5)  Prozac 40 Mg Caps (Fluoxetine hcl) .[name].. 1 by mouth once daily 6)  Fish Oil Concentrate 1000 Mg Caps (Omega-3 fatty acids) .... 2 caps by mouth two times a day 7)  Abilify 5 Mg Tabs (Aripiprazole) .... Take 1 tablet by mouth once a day 8)  Proair Hfa 108 (90 Base) Mcg/act Aers (Albuterol sulfate) .... 2 puffs four times a day as needed  rescue 9)  Ms Contin 60 Mg Xr12h-tab (Morphine sulfate) .[name].. 1 by mouth two times a day 10)  Flexeril 10 Mg Tabs (Cyclobenzaprine hcl) .[name].. 1 by mouth three times a day 11)  Fenofibrate 160 Mg Tabs (Fenofibrate) .... Take one tablet by mouth daily with a meal 12)  Hydrochlorothiazide 25 Mg Tabs (Hydrochlorothiazide) .... Take one tablet by mouth daily.  Current Allergies (reviewed today): ASPIRIN (ASPIRIN) CODEINE PHOSPHATE (CODEINE PHOSPHATE)

## 2010-12-27 NOTE — Assessment & Plan Note (Signed)
Summary: [name] (DATE PER [facility]) ///KP   Primary Barrett Goldie/Referring Cainan Trull:  [name] DO  CC:  follow up visit-COPD; SOB with activity and at rest; Cough-productive-white.[name]  History of Present Illness: May 01, 2010--Presents for prod cough with green/brown mucus, sinus pressure/congestion with PND, sore throat, increased SOB, wheezing, chills and states eyes are matted together in the mornings x5days. No otc used.  Denies chest pain, dyspnea, orthopnea, hemoptysis, fever, n/v/d, edema, headache,recent travel or antibiotics.    August 14, 2010-May 01, 2010--Presents for prod cough with green/brown mucus, sinus pressure/congestion with PND, sore throat, increased SOB, wheezing, chills and states eyes are matted together in the mornings x5days. No otc used.  Denies chest pain, dyspnea, orthopnea, hemoptysis, fever, n/v/d, edema, headache,recent travel or antibiotics.    September 24, 2010-  COPD, tobacco, hx lung nodule, CAD Nurse-CC: follow up visit-COPD; SOB with activity and at rest; Cough-productive-white. More frequent need for rescue inhaler about 3x/ week since outside air has  been colder. Proair is sufficient when needed. Denies infection, chest pain, blood, purulent sputum. OSA- NPSG- AHI 11.3, mild obstructive and central apnea, desat to 88%.  Preventive Screening-Counseling & Management  Alcohol-Tobacco     Smoking Status: current     Smoking Cessation Counseling: yes     Packs/Day: <0.25     Year Started: Started 55 years old     Tobacco Counseling: to quit use of tobacco products  Current Medications (verified): 1)  Amlodipine Besylate 10 Mg Tabs (Amlodipine Besylate) .... Take 1 Tablet Once A Day 2)  Foradil Aerolizer 12 Mcg  Caps (Formoterol Fumarate) .[name].. 1 Puff Two Times A Day 3)  Pulmicort Flexhaler 180 Mcg/act  Inha (Budesonide (Inhalation)) .[name].. 1 Puff Two Times A Day 4)  Prozac 40 Mg  Caps (Fluoxetine Hcl) .[name].. 1 By Mouth Once Daily 5)  Fish Oil Concentrate  1000 Mg  Caps (Omega-3 Fatty Acids) .... Take 1 Capsule By Mouth Three Times A Day 6)  Proair Hfa 108 (90 Base) Mcg/act Aers (Albuterol Sulfate) .... 2 Puffs Four Times A Day As Needed Rescue 7)  Ms Contin 17 Mg Xr12h-Tab (Morphine Sulfate) .[name].. 1 By Mouth Two Times A Day 8)  Flexeril 10 Mg Tabs (Cyclobenzaprine Hcl) .... As Needed 9)  Carvedilol 25 Mg Tabs (Carvedilol) .... Take One Tablet By Mouth Twice A Day 10)  Remeron 45 Mg Tabs (Mirtazapine) .... Take 1 Tablet By Mouth Once A Day 11)  Clonazepam 1 Mg Tabs (Clonazepam) .... As Needed 12)  Lipitor 20 Mg Tabs (Atorvastatin Calcium) .... One Daily 13)  Imdur 30 Mg Xr24h-Tab (Isosorbide Mononitrate) .[name].. 1 By Mouth Daily  Allergies (verified): 1)  Aspirin (Aspirin) 2)  Codeine Phosphate (Codeine Phosphate)  Past History:  Past Surgical History: Last updated: 06/12/2010 S/P Spinal fusion L4-L5  03/05/07 Left hip surgery Left arm surgery Right shoulder surgery  C- spine surgery   Family History: Last updated: 05/16/2010 Father died at age 18 after a noncardiac surgery. Mother died at age 71 of myocardial infarction Sister is alive at age 35, she had myocardial infarction at age 72 has emphysema   Social History: Last updated: 05-16-10 Occupation: Psychologist, occupational (disabled) Current Smoker 4 cig living with wife - married 32 Alcohol use-no Drug use-no He had 53 year old son who died in a car accident several years ago.  Risk Factors: Smoking Status: current (09/24/2010) Packs/Day: <0.25 (09/24/2010)  Past Medical History: CAD (Left Main 30% stenosis, LAD 20 - 30 % stenosis, first and second  diagonal branchesat 40 - 50% stenosis with small arteries, circumflex had 30% stenosis in the large obtuse marginal, RCA at 70 - 80% stenosis [not felt to be occlusive after evaluation with flow wire], distal 50 - 60% stenosis). Well preserved ejection fraction COPD  Depression / anxiety Hyperlipidemia (followed in lipid clinic) Chronic  Insomnia Ongoing tobacco abuse (1 pack per day) Obstructive Sleep Apnea- NPSG 09/10/10- AHI 11.3/hr Gout GERD Peripheral vascular disease (ABI 0.9 on right and 0.89 on left)  severe left external iliac stenosis.  He had successful stenting of his left external iliac per Dr. Excell Seltzer.  Degeneraive disk disease  Review of Systems      See HPI       The patient complains of dyspnea on exertion and prolonged cough.  The patient denies anorexia, fever, weight loss, weight gain, vision loss, decreased hearing, hoarseness, chest pain, syncope, peripheral edema, headaches, hemoptysis, severe indigestion/heartburn, muscle weakness, suspicious skin lesions, abnormal bleeding, enlarged lymph nodes, and angioedema.    Vital Signs:  Patient profile:   55 year old male Height:      67 inches Weight:      196.38 pounds BMI:     30.87 O2 Sat:      96 % on Room air Pulse rate:   68 / minute BP sitting:   162 / 80  (left arm) Cuff size:   regular  Vitals Entered By: Reynaldo Minium CMA (September 24, 2010 3:09 PM)  O2 Flow:  Room air CC: follow up visit-COPD; SOB with activity and at rest; Cough-productive-white.   Physical Exam  Additional Exam:  General: A/Ox3; pleasant and cooperative, NAD, overweight, beard SKIN: no rash, lesions NODES: no lymphadenopathy HEENT: [place]/AT, EOM- WNL, Conjuctivae- clear, PERRLA, TM-WNL, Nose- clear discharge  Throat- Mallampati III, red NECK: Supple w/ fair ROM, JVD- none, normal carotid impulses w/o bruits Thyroid- normal to palpation CHEST: clear to P&A,  but deep breath triggered coughing HEART: RRR, no m/g/r heard ABDOMEN: Soft and nl;  ZOX:WRUE, nl pulses, no edema  NEURO: Grossly intact to observation      Impression & Recommendations:  Problem # 1:  COPD (ICD-496) Mild weather related exacerbation within the range of control of his rescue inhaler for now.   Problem # 2:  OBSTRUCTIVE SLEEP APNEA (ICD-327.23)  Mild OSA. We discussed treatment  options, sleep hygiene, driving safety, medical concerns and will start with a trial of CPAP autotitrated.   Problem # 3:  TOBACCO USER (ICD-305.1) We continue to encourage smoking cessation, offer support and tryto support his family with this effort.  Other Orders: Est. Patient Level IV (45409) DME Referral (DME)  Patient Instructions: 1)  Please schedule a follow-up appointment in 1 month. 2)  See [facility] to get started with CPAP

## 2010-12-27 NOTE — Progress Notes (Signed)
Summary: CPAP autotitrated to 10  Phone Note Other Incoming   Summary of Call: CPAP download good compliance and control on 10 cwp  Follow-up for Phone Call        order [facility]ed to [facility] to set cpap@10cm   Follow-up by: Oneita Jolly,  November 16, 2010 9:39 AM    New/Updated Medications: * CPAP 10 [facility]

## 2010-12-27 NOTE — Progress Notes (Signed)
Summary: nos appt  Phone Note Call from Patient   Caller: [email or web address]  Call For: Olaoluwa Grieder Summary of Call: Rsc nos from 8/25 to 9/20 2 1:45p. Initial call taken by: Darletta Moll,  July 20, 2010 10:38 AM

## 2010-12-27 NOTE — Progress Notes (Signed)
Summary: cpap rx/ Dr Maple Hudson has  Phone Note From Pharmacy   Caller: Aram Beecham w/ [facility] Call For: young  Summary of Call: waiting to receive rx for cpap. [phone] Initial call taken by: Tivis Ringer, CNA,  October 17, 2010 12:34 PM  Follow-up for Phone Call        Aram Beecham states that she faxed over a rx for CPAP on 10-10-10. Alida have you seen this? Please advsie. Carron Curie CMA  October 17, 2010 1:10 PM  Spoke with Aram Beecham @ [facility], informed we do have, and it is in Dr Sinclair Ship look at to be signed, as soon as signed we will fax .Kandice Hams [facility]  October 17, 2010 1:37 PM   Follow-up by: Kandice Hams CMA,  October 17, 2010 1:37 PM

## 2010-12-27 NOTE — Letter (Signed)
Summary: Medical Report Form/[facility]  Medical Report Form/[facility]   Imported By: Lanelle Bal 07/27/2010 08:48:44  _____________________________________________________________________  External Attachment:    Type:   Image     Comment:   External Document

## 2010-12-27 NOTE — Miscellaneous (Signed)
  Clinical Lists Changes  Observations: Added new observation of NUCLEAR NOS: Exercise capacity: Adenosine with low level exercise  Blood Pressure response: Normal blood pressure response  Clinical sypmptoms: Facial pressure   ECG impressions: No significant ST segment change suggestive of ischemia  Overall impressions: Normal stress nuclear study. (12/09/2008 10:23)      Nuclear Study  Procedure date:  12/09/2008  Findings:      Exercise capacity: Adenosine with low level exercise  Blood Pressure response: Normal blood pressure response  Clinical sypmptoms: Facial pressure   ECG impressions: No significant ST segment change suggestive of ischemia  Overall impressions: Normal stress nuclear study.

## 2010-12-27 NOTE — Miscellaneous (Signed)
  Clinical Lists Changes  Observations: Added new observation of CARDCATHFIND: Diffuse nonobstructive large vessel coronary disease with obstructive small-vessel disease.  I suspect the branch of the posterolateral with subtotal stenosis is the culprit for the abnormal stress perfusion study.  At this point, I will manage this medically with aggressive risk reduction and the addition of nitrates to his regimen.  Consideration could be given to treating the right coronary artery if he has progressive or persistent symptoms.     (07/17/2010 8:05) Added new observation of NUCLEAR NOS: Exercise Capacity: Lexiscan BP Response: Normal blood pressure response. Clinical Symptoms: Nausea ECG Impression: No significant ST segment change suggestive of ischemia. Overall Impression Comments: There is moderate ischemia in the inferolateral wall.  (07/05/2010 8:05)      Cardiac Cath  Procedure date:  07/17/2010  Findings:      Diffuse nonobstructive large vessel coronary disease with obstructive small-vessel disease.  I suspect the branch of the posterolateral with subtotal stenosis is the culprit for the abnormal stress perfusion study.  At this point, I will manage this medically with aggressive risk reduction and the addition of nitrates to his regimen.  Consideration could be given to treating the right coronary artery if he has progressive or persistent symptoms.      Nuclear Study  Procedure date:  07/05/2010  Findings:      Exercise Capacity: Lexiscan BP Response: Normal blood pressure response. Clinical Symptoms: Nausea ECG Impression: No significant ST segment change suggestive of ischemia. Overall Impression Comments: There is moderate ischemia in the inferolateral wall.

## 2010-12-27 NOTE — Progress Notes (Signed)
Summary: requesting zpack > ok to repeat  Phone Note Call from Patient Call back at Home Phone [phone]   Caller: Patient Call For: young Reason for Call: Talk to Nurse Summary of Call: Patient asking for zpack.  [facility]  Initial call taken by: Lehman Prom,  November 07, 2010 1:15 PM  Follow-up for Phone Call        called spoke with patient who states that the zpak prescribed at last ov (12.5.11) did not completely eleviate his symptoms.  still c/o prod cough with yellow mucus, wheezing, DOE.  states finished zpak 3 days ago.  requesting another zpak.  please advise, thanks!  allergies: asa, codeine.  [facility]. Boone Master CNA/MA  November 07, 2010 3:08 PM  Follow-up by: Waymon Budge MD,  November 07, 2010 3:20 PM  Additional Follow-up for Phone Call Additional follow up Details #1::        OK tpo repeat Z pak Additional Follow-up by: Waymon Budge MD,  November 07, 2010 3:20 PM    Additional Follow-up for Phone Call Additional follow up Details #2::    [facility] to [facility].  called spoke with patient, advised of CDY's recs and pending rx.  pt verbalized his understanding. Boone Master CNA/MA  November 07, 2010 3:36 PM   Prescriptions: ZITHROMAX Z-PAK 250 MG TABS (AZITHROMYCIN) 2 today then one daily  #1 pak x 0   Entered by:   Boone Master CNA/MA   Authorized by:   Waymon Budge MD   Signed by:   Boone Master CNA/MA on 11/07/2010   Method used:   [facility]        [facility] [address] [phone][facility] (retail)       [address]       [place], [place]  [address]       Ph: [phone] or [phone]       Fax: [phone]   RxID:   [id number]

## 2010-12-27 NOTE — Assessment & Plan Note (Signed)
Summary: [name] 1 MONTH///KP   Primary Provider/Referring Provider:  [name] DO  CC:  1 month follow up visit-SOB, wheezing, cough, and chest heaviness.[name]  History of Present Illness:  August 14, 2010-May 01, 2010--Presents for prod cough with green/brown mucus, sinus pressure/congestion with PND, sore throat, increased SOB, wheezing, chills and states eyes are matted together in the mornings x5days. No otc used.  Denies chest pain, dyspnea, orthopnea, hemoptysis, fever, n/v/d, edema, headache,recent travel or antibiotics.    September 24, 2010-  COPD, tobacco, hx lung nodule, CAD Nurse-CC: follow up visit-COPD; SOB with activity and at rest; Cough-productive-white. More frequent need for rescue inhaler about 3x/ week since outside air has  been colder. Proair is sufficient when needed. Denies infection, chest pain, blood, purulent sputum. OSA- NPSG- AHI 11.3, mild obstructive and central apnea, desat to 88%.  October 29, 2010-  COPD, tobacco, hx lung nodule, CAD.wife here, OSA Nurse-CC: 1 month follow up visit-SOB,wheezing, cough, chest heaviness. Acute visit- 2 weeks malaise, achey, more tight chest, cloudy scant sputum, wheeze and increased need for his rescue inhaler. Has the medicines, they just aren't working well enough. CPAP helps now. He sleeps well with it and is napping less. He hasn't turned in the download card yet.    Preventive Screening-Counseling & Management  Alcohol-Tobacco     Smoking Status: current     Smoking Cessation Counseling: yes     Packs/Day: <0.25     Year Started: Started 55 years old     Tobacco Counseling: to quit use of tobacco products  Current Medications (verified): 1)  Amlodipine Besylate 10 Mg Tabs (Amlodipine Besylate) .... Take 1 Tablet Once A Day 2)  Foradil Aerolizer 12 Mcg  Caps (Formoterol Fumarate) .[name].. 1 Puff Two Times A Day 3)  Pulmicort Flexhaler 180 Mcg/act  Inha (Budesonide (Inhalation)) .[name].. 1 Puff Two Times A Day 4)   Prozac 40 Mg  Caps (Fluoxetine Hcl) .[name].. 1 By Mouth Once Daily 5)  Fish Oil Concentrate 1000 Mg  Caps (Omega-3 Fatty Acids) .... Take 1 Capsule By Mouth Three Times A Day 6)  Proair Hfa 108 (90 Base) Mcg/act Aers (Albuterol Sulfate) .... 2 Puffs Four Times A Day As Needed Rescue 7)  Ms Contin 17 Mg Xr12h-Tab (Morphine Sulfate) .[name].. 1 By Mouth Two Times A Day 8)  Flexeril 10 Mg Tabs (Cyclobenzaprine Hcl) .... As Needed 9)  Carvedilol 25 Mg Tabs (Carvedilol) .... Take One Tablet By Mouth Twice A Day 10)  Remeron 45 Mg Tabs (Mirtazapine) .... Take 1 Tablet By Mouth Once A Day 11)  Clonazepam 1 Mg Tabs (Clonazepam) .... As Needed 12)  Lipitor 20 Mg Tabs (Atorvastatin Calcium) .... One Daily 13)  Imdur 30 Mg Xr24h-Tab (Isosorbide Mononitrate) .[name].. 1 By Mouth Daily  Allergies (verified): 1)  Aspirin (Aspirin) 2)  Codeine Phosphate (Codeine Phosphate)  Past History:  Past Medical History: Last updated: 09/24/2010 CAD (Left Main 30% stenosis, LAD 20 - 30 % stenosis, first and second diagonal branchesat 40 - 50% stenosis with small arteries, circumflex had 30% stenosis in the large obtuse marginal, RCA at 70 - 80% stenosis [not felt to be occlusive after evaluation with flow wire], distal 50 - 60% stenosis). Well preserved ejection fraction COPD  Depression / anxiety Hyperlipidemia (followed in lipid clinic) Chronic Insomnia Ongoing tobacco abuse (1 pack per day) Obstructive Sleep Apnea- NPSG 09/10/10- AHI 11.3/hr Gout GERD Peripheral vascular disease (ABI 0.9 on right and 0.89 on left)  severe left external  iliac stenosis.  He had successful stenting of his left external iliac per Dr. Excell Seltzer.  Degeneraive disk disease  Past Surgical History: Last updated: 06/12/2010 S/P Spinal fusion L4-L5  03/05/07 Left hip surgery Left arm surgery Right shoulder surgery  C- spine surgery   Family History: Last updated: 05-24-2010 Father died at age 13 after a noncardiac surgery. Mother died at  age 16 of myocardial infarction Sister is alive at age 44, she had myocardial infarction at age 55 has emphysema   Social History: Last updated: May 24, 2010 Occupation: Psychologist, occupational (disabled) Current Smoker 4 cig living with wife - married 31 Alcohol use-no Drug use-no He had 25 year old son who died in a car accident several years ago.  Risk Factors: Smoking Status: current (10/29/2010) Packs/Day: <0.25 (10/29/2010)  Review of Systems      See HPI       The patient complains of shortness of breath with activity and nasal congestion/difficulty breathing through nose.  The patient denies shortness of breath at rest, productive cough, non-productive cough, coughing up blood, chest pain, irregular heartbeats, acid heartburn, indigestion, loss of appetite, weight change, abdominal pain, difficulty swallowing, sore throat, tooth/dental problems, headaches, and sneezing.    Vital Signs:  Patient profile:   55 year old male Height:      67 inches Weight:      201.13 pounds BMI:     31.62 O2 Sat:      96 % on Room air Pulse rate:   66 / minute BP sitting:   122 / 80  (left arm) Cuff size:   regular  Vitals Entered By: Reynaldo Minium CMA (October 29, 2010 3:33 PM)  O2 Flow:  Room air CC: 1 month follow up visit-SOB,wheezing, cough, chest heaviness.   Physical Exam  Additional Exam:  General: A/Ox3; pleasant and cooperative, NAD, overweight, beard SKIN: no rash, lesions NODES: no lymphadenopathy HEENT: [place]/AT, EOM- WNL, Conjuctivae- clear, PERRLA, TM-WNL, Nose- clear discharge  Throat- Mallampati III, red NECK: Supple w/ fair ROM, JVD- none, normal carotid impulses w/o bruits Thyroid- normal to palpation CHEST: clear to P&A,  but deep breath triggered coughing HEART: RRR, no m/g/r heard ABDOMEN: Soft and nl;  ZOX:WRUE, nl pulses, no edema  NEURO: Grossly intact to observation      Impression & Recommendations:  Problem # 1:  OBSTRUCTIVE SLEEP APNEA (ICD-327.23)  Good initial  start with CPAP, compliiance is good and definitely helping with reduced sleepiness and snoring.  Problem # 2:  ACUTE BRONCHITIS (ICD-466.0)  Probable low grade viral btronchitis hanging on . We discussed available measures and will give depo today then Z pak to hold.  His updated medication list for this problem includes:    Foradil Aerolizer 12 Mcg Caps (Formoterol fumarate) .[name]... 1 puff two times a day    Pulmicort Flexhaler 180 Mcg/act Inha (Budesonide (inhalation)) .[name]... 1 puff two times a day    Proair Hfa 108 (90 Base) Mcg/act Aers (Albuterol sulfate) .[name]... 2 puffs four times a day as needed rescue    Zithromax Z-pak 250 Mg Tabs (Azithromycin) .[name]... 2 today then one daily  Medications Added to Medication List This Visit: 1)  Zithromax Z-pak 250 Mg Tabs (Azithromycin) .... 2 today then one daily  Other Orders: Est. Patient Level III (45409)  Patient Instructions: 1)  Keep scheduled appointment- call earlier if needed 2)  Script to hold for Z pak antibiotic 3)  Depo 80 4)  When you turn in the download card for your CPAP  I will order a fixed setting for it.  Prescriptions: ZITHROMAX Z-PAK 250 MG TABS (AZITHROMYCIN) 2 today then one daily  #1 pak x 0   Entered and Authorized by:   Waymon Budge MD   Signed by:   Waymon Budge MD on 10/29/2010   Method used:   Print then Give to Patient   RxID:   [id number]

## 2010-12-27 NOTE — Letter (Signed)
Summary: Custom - Lipid  [facility], Main Office  [address]   [place], [place] [address]   Phone: [phone]  Fax: [phone]         August 09, 2010 MRN: [id number]   Ernest [name] [address] [place], [place]  [address]   Dear [name],  We have reviewed your cholesterol results.  They are as follows:     Total Cholesterol:    169 (Desirable: less than 200)       HDL  Cholesterol:     33.00  (Desirable: greater than 40 for men and 50 for women)       LDL Cholesterol:       DEL  (Desirable: less than 100 for low risk and less than 70 for moderate to high risk)       Triglycerides:       412.0  (Desirable: less than 150)  Our recommendations include:  Unable to calculate LDL because triglycerides are higher than 400.  Dr Antoine Poche would like for you look at your diet and make sure you are not consuming more than 7% saturated fat or 10% of total calories from fat daily.    Call our office at the number listed above if you have any questions.  Lowering your LDL cholesterol is important, but it is only one of a large number of "risk factors" that may indicate that you are at risk for heart disease, stroke or other complications of hardening of the arteries.  Other risk factors include:   A.  Cigarette Smoking* B.  High Blood Pressure* C.  Obesity* D.   Low HDL Cholesterol (see yours above)* E.   Diabetes Mellitus (higher risk if your is uncontrolled) F.  Family history of premature heart disease G.  Previous history of stroke or cardiovascular disease          *These are risk factors YOU HAVE CONTROL OVER.  For more information, visit .  There is now evidence that lowering the TOTAL CHOLESTEROL AND LDL CHOLESTEROL can reduce the risk of heart disease.  The American Heart Association recommends the following guidelines for the treatment of elevated cholesterol:  1.  If there is now current heart disease and less than two  risk factors, TOTAL CHOLESTEROL should be less than 200 and LDL CHOLESTEROL should be less than 100. 2.  If there is current heart disease or two or more risk factors, TOTAL CHOLESTEROL should be less than 200 and LDL CHOLESTEROL should be less than 70.  A diet low in cholesterol, saturated fat, and calories is the cornerstone of treatment for elevated cholesterol.  Cessation of smoking and exercise are also important in the management of elevated cholesterol and preventing vascular disease.  Studies have shown that 30 to 60 minutes of physical activity most days can help lower blood pressure, lower cholesterol, and keep your weight at a healthy level.  Drug therapy is used when cholesterol levels do not respond to therapeutic lifestyle changes (smoking cessation, diet, and exercise) and remains unacceptably high.  If medication is started, it is important to have you levels checked periodically to evaluate the need for further treatment options.    Thank you,     Sander Nephew, RN for Dr Rollene Rotunda [facility] Team

## 2010-12-27 NOTE — Assessment & Plan Note (Signed)
Summary: rov/ gd  Medications Added FISH OIL CONCENTRATE 1000 MG  CAPS (OMEGA-3 FATTY ACIDS) Take 1 capsule by mouth three times a day FLEXERIL 10 MG TABS (CYCLOBENZAPRINE HCL) as needed CARVEDILOL 25 MG TABS (CARVEDILOL) Take one tablet by mouth twice a day REMERON 45 MG TABS (MIRTAZAPINE) Take 1 tablet by mouth once a day CLONAZEPAM 1 MG TABS (CLONAZEPAM) as needed ASPIRIN 81 MG TBEC (ASPIRIN) Take one tablet by mouth daily LIPITOR 20 MG TABS (ATORVASTATIN CALCIUM) one daily        Visit Type:  Follow-up Primary Provider:  [name] DO  CC:  NO COMPLAINS.  History of Present Illness: The patient presents for evaluation of his known coronary disease. He had nonobstructive disease on catheter in 2007. His last stress perfusion study was January 2010. Over the last couple of weeks he has had episodic chest discomfort. It is not like previous. It is substernal. It happens at rest and is not reproducible with activity. It may last for 15-30 minutes. It feels like somebody pressing. There is no associated nausea or vomiting though there is some diaphoresis. There is no worsening of his chronic shortness of breath. He is not describing PND or orthopnea.  Current Medications (verified): 1)  Amlodipine Besylate 10 Mg Tabs (Amlodipine Besylate) .... Take 1 Tablet Once A Day 2)  Simvastatin 40 Mg Tabs (Simvastatin) .... Take 1 Tablet By Mouth At Bedtime 3)  Foradil Aerolizer 12 Mcg  Caps (Formoterol Fumarate) .[name].. 1 Puff Two Times A Day 4)  Pulmicort Flexhaler 180 Mcg/act  Inha (Budesonide (Inhalation)) .[name].. 1 Puff Two Times A Day 5)  Prozac 40 Mg  Caps (Fluoxetine Hcl) .[name].. 1 By Mouth Once Daily 6)  Fish Oil Concentrate 1000 Mg  Caps (Omega-3 Fatty Acids) .... Take 1 Capsule By Mouth Three Times A Day 7)  Abilify 5 Mg Tabs (Aripiprazole) .... Take 1 Tablet By Mouth Once A Day 8)  Proair Hfa 108 (90 Base) Mcg/act Aers (Albuterol Sulfate) .... 2 Puffs Four Times A Day As Needed Rescue 9)  Ms  Contin 63 Mg Xr12h-Tab (Morphine Sulfate) .[name].. 1 By Mouth Two Times A Day 10)  Flexeril 10 Mg Tabs (Cyclobenzaprine Hcl) .... As Needed 11)  Carvedilol 25 Mg Tabs (Carvedilol) .... Take One Tablet By Mouth Twice A Day 12)  Remeron 45 Mg Tabs (Mirtazapine) .... Take 1 Tablet By Mouth Once A Day 13)  Clonazepam 1 Mg Tabs (Clonazepam) .... As Needed 14)  Aspirin 81 Mg Tbec (Aspirin) .... Take One Tablet By Mouth Daily  Allergies: 1)  Aspirin (Aspirin) 2)  Codeine Phosphate (Codeine Phosphate)  Past History:  Past Medical History: Reviewed history from 05/15/2010 and no changes required. CAD (Left Main 30% stenosis, LAD 20 - 30 % stenosis, first and second diagonal branchesat 40 - 50% stenosis with small arteries, circumflex had 30% stenosis in the large obtuse marginal, RCA at 70 - 80% stenosis [not felt to be occlusive after evaluation with flow wire], distal 50 - 60% stenosis). Well preserved ejection fraction COPD  Depression / anxiety Hyperlipidemia (followed in lipid clinic) Chronic Insomnia Ongoing tobacco abuse (1 pack per day) Gout GERD Peripheral vascular disease (ABI 0.9 on right and 0.89 on left)  severe left external iliac stenosis.  He had successful stenting of his left external iliac per Dr. Excell Seltzer.  Degeneraive disk disease  Past Surgical History: S/P Spinal fusion L4-L5  03/05/07 Left hip surgery Left arm surgery Right shoulder surgery  C- spine surgery  Review of Systems       As stated in the HPI and negative for all other systems.   Vital Signs:  Patient profile:   55 year old male Height:      67 inches Weight:      191.50 pounds BMI:     30.10 Pulse rate:   73 / minute Pulse rhythm:   regular Resp:     18 per minute BP sitting:   122 / 64  (left arm) Cuff size:   large  Vitals Entered By: Vikki Ports (June 12, 2010 11:15 AM)  Physical Exam  General:  Well developed, well nourished, in no acute distress. Head:  normocephalic and  atraumatic Eyes:  PERRLA/EOM intact; conjunctiva and lids normal. Mouth:  Upper denture. Oral mucosa normal. Neck:  Neck supple, no JVD. No masses, thyromegaly or abnormal cervical nodes. Chest Wall:  no deformities or breast masses noted Lungs:  Clear bilaterally to auscultation and percussion. Abdomen:  Bowel sounds positive; abdomen soft and non-tender without masses, organomegaly, or hernias noted. No hepatosplenomegaly. Msk:  Back normal, normal gait. Muscle strength and tone normal. Extremities:  No clubbing or cyanosis. Neurologic:  Alert and oriented x 3. Skin:  Intact without lesions or rashes. Cervical Nodes:  no significant adenopathy Axillary Nodes:  no significant adenopathy Inguinal Nodes:  no significant adenopathy Psych:  Normal affect.   Detailed Cardiovascular Exam  Neck    Carotids: Carotids full and equal bilaterally without bruits.      Neck Veins: Normal, no JVD.    Heart    Inspection: no deformities or lifts noted.      Palpation: normal PMI with no thrills palpable.      Auscultation: regular rate and rhythm, S1, S2 without murmurs, rubs, gallops, or clicks.    Vascular    Abdominal Aorta: no palpable masses, pulsations, or audible bruits.      Femoral Pulses: normal femoral pulses bilaterally.      Pedal Pulses: absent right dorsalis pedis pulse, absent right posterior tibial pulse, absent left dorsalis pedis pulse, and absent left posterior tibial pulse.      Radial Pulses: Absent left radial    Peripheral Circulation: no clubbing, cyanosis, or edema noted with normal capillary refill.     EKG  Procedure date:  05/15/2010  Findings:      Sinus rhythm, rate 69, axis within normal limits, intervals within normal limits, no acute ST-T wave changes.  Impression & Recommendations:  Problem # 1:  CHEST PAIN (ICD-786.50)  The patient has some chest discomfort with some atypical and some typical features. He does have known nonobstructive disease.  There is a possibility that this could be worsening of this and I need to assess with a stress test. He says he would not even ambulate because of leg pain. He will have a pharmacologic perfusion study.  Orders: Nuclear Stress Test (Nuc Stress Test)  Problem # 2:  PURE HYPERCHOLESTEROLEMIA (ICD-272.0) He should not be on the current combination of amlodipine and simvastatin. We'll switch him to Lipitor 20 mg daily and ordered a lipid profile and liver enzymes in 8 weeks.  Problem # 3:  TOBACCO USER (ICD-305.1) We talked about the need to stop smoking and he is going to give it up completely this week.  Problem # 4:  HYPERTENSION (ICD-401.9) His blood pressure is controlled and he will continue with the meds as listed.  Patient Instructions: 1)  Your physician recommends that you schedule a follow-up  appointment in: 1 year with Dr Antoine Poche 2)  Your physician recommends that you return for a FASTING lipid and liver profile: in 8 weeks  272.4 v58.69 3)  Your physician has recommended you make the following change in your medication: Stop simvastatin and start Lipitor 20 mg a day 4)  Your physician has requested that you have an adenosine myoview.  For further information please visit [email or web address]  Please follow instruction sheet, as given. Prescriptions: LIPITOR 20 MG TABS (ATORVASTATIN CALCIUM) one daily  #30 x 11   Entered by:   Charolotte Capuchin, RN   Authorized by:   Rollene Rotunda, MD, [facility]   Signed by:   Charolotte Capuchin, RN on 06/12/2010   Method used:   Electronically to        [facility] [address] [phone]* (retail)       [address]       [place], [place]  [address]       Ph: [phone] or [phone]       Fax: [phone]   RxID:   [id number]  I have reviewed and approved all prescriptions at the time of this visit. Rollene Rotunda, MD, [facility]  June 12, 2010 1:36 PM

## 2010-12-27 NOTE — Assessment & Plan Note (Signed)
Summary: Ernest [name]   Visit Type:  Follow-up Primary Provider:  [name] DO  CC:  CAD.  History of Present Illness: The patient presents for followup of his known coronary disease status post catheterization. I found disease we are managing medically. I did start him on Imdur. He's had none of the chest discomfort that was his previous angina. He denies any chest pressure, neck or arm discomfort. He's not having any palpitations, presyncope or syncope. He is having some nausea since starting Imdur. He has increased somnolence and sleeps a lot. He is down to 3 cigarettes a day. He had no problems with bleeding or bruising at his catheterization site.  Current Medications (verified): 1)  Amlodipine Besylate 10 Mg Tabs (Amlodipine Besylate) .... Take 1 Tablet Once A Day 2)  Foradil Aerolizer 12 Mcg  Caps (Formoterol Fumarate) .[name].. 1 Puff Two Times A Day 3)  Pulmicort Flexhaler 180 Mcg/act  Inha (Budesonide (Inhalation)) .[name].. 1 Puff Two Times A Day 4)  Prozac 40 Mg  Caps (Fluoxetine Hcl) .[name].. 1 By Mouth Once Daily 5)  Fish Oil Concentrate 1000 Mg  Caps (Omega-3 Fatty Acids) .... Take 1 Capsule By Mouth Three Times A Day 6)  Proair Hfa 108 (90 Base) Mcg/act Aers (Albuterol Sulfate) .... 2 Puffs Four Times A Day As Needed Rescue 7)  Ms Contin 75 Mg Xr12h-Tab (Morphine Sulfate) .[name].. 1 By Mouth Two Times A Day 8)  Flexeril 10 Mg Tabs (Cyclobenzaprine Hcl) .... As Needed 9)  Carvedilol 25 Mg Tabs (Carvedilol) .... Take One Tablet By Mouth Twice A Day 10)  Remeron 45 Mg Tabs (Mirtazapine) .... Take 1 Tablet By Mouth Once A Day 11)  Clonazepam 1 Mg Tabs (Clonazepam) .... As Needed 12)  Lipitor 20 Mg Tabs (Atorvastatin Calcium) .... One Daily 13)  Imdur 30 Mg Xr24h-Tab (Isosorbide Mononitrate) .[name].. 1 By Mouth Daily  Allergies (verified): 1)  Aspirin (Aspirin) 2)  Codeine Phosphate (Codeine Phosphate)  Past History:  Past Medical History: Reviewed history from 05/15/2010 and no changes  required. CAD (Left Main 30% stenosis, LAD 20 - 30 % stenosis, first and second diagonal branchesat 40 - 50% stenosis with small arteries, circumflex had 30% stenosis in the large obtuse marginal, RCA at 70 - 80% stenosis [not felt to be occlusive after evaluation with flow wire], distal 50 - 60% stenosis). Well preserved ejection fraction COPD  Depression / anxiety Hyperlipidemia (followed in lipid clinic) Chronic Insomnia Ongoing tobacco abuse (1 pack per day) Gout GERD Peripheral vascular disease (ABI 0.9 on right and 0.89 on left)  severe left external iliac stenosis.  He had successful stenting of his left external iliac per Dr. Excell Seltzer.  Degeneraive disk disease  Past Surgical History: Reviewed history from 06/12/2010 and no changes required. S/P Spinal fusion L4-L5  03/05/07 Left hip surgery Left arm surgery Right shoulder surgery  C- spine surgery   Review of Systems       As stated in the HPI and negative for all other systems.   Vital Signs:  Patient profile:   55 year old male Height:      67 inches Weight:      192 pounds BMI:     30.18 Pulse rate:   69 / minute Resp:     16 per minute BP sitting:   138 / 74  (right arm)  Vitals Entered By: Marrion Coy, CNA (August 13, 2010 3:24 PM)  Physical Exam  General:  Well developed, well nourished, in no  acute distress. Head:  normocephalic and atraumatic Eyes:  PERRLA/EOM intact; conjunctiva and lids normal. Mouth:  Teeth, gums and palate normal. Oral mucosa normal. Neck:  Neck supple, no JVD. No masses, thyromegaly or abnormal cervical nodes. Chest Wall:  no deformities or breast masses noted Lungs:  Clear bilaterally to auscultation and percussion. Heart:  Non-displaced PMI, chest non-tender; regular rate and rhythm, S1, S2 without murmurs, rubs or gallops. Carotid upstroke normal, no bruit. Normal abdominal aortic size, no bruits. Femorals normal pulses, no bruits. Pedals normal pulses. No edema, no  varicosities. Abdomen:  Bowel sounds positive; abdomen soft and non-tender without masses, organomegaly, or hernias noted. No hepatosplenomegaly. Msk:  Back normal, normal gait. Muscle strength and tone normal. Extremities:  No clubbing or cyanosis. Neurologic:  Alert and oriented x 3. Skin:  Intact without lesions or rashes. Cervical Nodes:  no significant adenopathy Inguinal Nodes:  no significant adenopathy Psych:  Normal affect.   EKG  Procedure date:  08/13/2010  Findings:      Sinus rhythm, rate 69, axis within normal limits, intervals within normal limits, no acute ST-T wave changes  Impression & Recommendations:  Problem # 1:  CORONARY ATHEROSCLEROSIS NATIVE CORONARY ARTERY (ICD-414.01) The patient has CAD as described. We are pursuing medical management. He's going to stop his Imdur for a few days to see if this really is causing the nausea. I suspect not. To resume the medication if he has any chest discomfort or continued nausea despite being off. He will continue with risk reduction. Toward that end I discussed an exercise regimen. Orders: EKG w/ Interpretation (93000)  Problem # 2:  TOBACCO USER (ICD-305.1) He is down to 3 cigarettes a day. I encouraged continued work towards complete abstinence  Problem # 3:  HYPERTENSION (ICD-401.9) His blood pressure is controlled and he will continue with the meds as listed.  Patient Instructions: 1)  Your physician recommends that you schedule a follow-up appointment in: 6 months 2)  Your physician recommends that you continue on your current medications as directed. Please refer to the Current Medication list given to you today.

## 2010-12-27 NOTE — Letter (Signed)
Summary: Cardiac Catheterization Instructions- [facility]  [facility], Main Office  [address]   [place], [place] [address]   Phone: [phone]  Fax: [phone]         07/16/2010 MRN: [id number]    LANDO ALCALDE [address] [place], [place]  [address]  Dear Mr. MAPEL,   You are scheduled for a Cardiac Catheterization on Tuesday, July 17, 2010 with Dr.  Antoine Poche  Please arrive to the 1st floor of the Heart and V[facility] Center at [facility] at 11 am on the day of your procedure. Please do not arrive before 6:30 a.m. Call the Heart and Vascular Center at [phone] if you are unable to make your appointmnet. The Code to get into the parking garage under the building is 0002 Take the elevators to the 1st floor. You must have someone to drive you home. Someone must be with you for the first 24 hours after you arrive home. Please wear clothes that are easy to get on and off and wear slip-on shoes. Do not eat or drink after midnight except water with your medications that morning. Bring all your medications and current insurance cards with you.   ___ Make sure you take your aspirin.  ___ You may take ALL of your medications with water that morning.   The usual length of stay after your procedure is 2 to 3 hours. This can vary.  If you have any questions, please call the office at the number listed above.   Charolotte Capuchin, RN for  Dr Rollene Rotunda

## 2010-12-27 NOTE — Letter (Signed)
Summary: [facility]  CMN/[facility]   Imported By: Lester Millwood 10/26/2010 08:11:45  _____________________________________________________________________  External Attachment:    Type:   Image     Comment:   External Document

## 2010-12-27 NOTE — Assessment & Plan Note (Signed)
Summary: Acute NP office visit - bronchitis   Primary Provider/Referring Provider:  Artist Pais  CC:  prod cough with green/brown mucus, sinus pressure/congestion with PND, sore throat, increased SOB, wheezing, and chills and states eyes are matted together in the mornings x5days.  History of Present Illness: 11/22/08-COPD Lying on right side gets right parasternal pain, also if turns head his known degenerative arthritis changes cause similar pain. Still DOE with sustained walking. A little wheeze lying down. No phlegm. Quit smoking 3 months ago- using Chantix.  CXR was c/w copd, no nodule.  01/17/09- COPD, tobacco use, hx lung nodule Had c-spine surgery Feb 11. Felt bronchitis going into surgery but cxr was neg for acute., He is now wearing a soft collar. Was better til today more hoarse, tired, poor appetite, Not able to cough productively.  05/19/09- COPD, tobacco use, hx lung nodule Says 5 months increased short of breath, noticed more by end of day. Hot shower smothers him. Hot weather makes him worse. Cough little. Smokes 1 cg/ day. Feet swell. Notes hot flashes.Denies blood, purulence, chest pain, palpitation. Using Foradil and Pulmicort two times a day without  a rescue inhaler. FEV1/FVC was 0.59 in 7/09.  December 19, 2009- COPD, tobacco, hx lung nodule Has increased wheeze and dyspnea after a viral bronchitis syndrome that had included fever and green mucus at first. He feels he is on the mend. Had flu vax. Has reduced smoking from 3 ppd to 1/2 ppd. Wife supports his efforts. She tells him he snores but not apnea. He notices mostly dificulty with sleep onset. A sleep study 2 years ago he reports was negative.  May 01, 2010--Presents for prod cough with green/brown mucus, sinus pressure/congestion with PND, sore throat, increased SOB, wheezing, chills and states eyes are matted together in the mornings x5days. No otc used.  Denies chest pain, dyspnea, orthopnea, hemoptysis, fever, n/v/d,  edema, headache,recent travel or antibiotics.    Preventive Screening-Counseling & Management  Alcohol-Tobacco     Packs/Day: 1.5  Medications Prior to Update: 1)  Amlodipine Besylate 10 Mg Tabs (Amlodipine Besylate) .... Take 1 Tablet Once A Day 2)  Simvastatin 40 Mg Tabs (Simvastatin) .... Take 1 Tablet By Mouth At Bedtime 3)  Foradil Aerolizer 12 Mcg  Caps (Formoterol Fumarate) .[name].. 1 Puff Two Times A Day 4)  Pulmicort Flexhaler 180 Mcg/act  Inha (Budesonide (Inhalation)) .[name].. 1 Puff Two Times A Day 5)  Prozac 40 Mg  Caps (Fluoxetine Hcl) .[name].. 1 By Mouth Once Daily 6)  Fish Oil Concentrate 1000 Mg  Caps (Omega-3 Fatty Acids) .... 2 Caps By Mouth Bid 7)  Abilify 5 Mg Tabs (Aripiprazole) .... Take 1 Tablet By Mouth Once A Day 8)  Proair Hfa 108 (90 Base) Mcg/act Aers (Albuterol Sulfate) .... 2 Puffs Four Times A Day As Needed Rescue 9)  Ms Contin 74 Mg Xr12h-Tab (Morphine Sulfate) .[name].. 1 By Mouth Bid 10)  Flexeril 10 Mg Tabs (Cyclobenzaprine Hcl) .[name].. 1 By Mouth Tid 11)  Fenofibrate 160 Mg Tabs (Fenofibrate) .... Take One Tablet By Mouth Daily With A Meal 12)  Hydrochlorothiazide 25 Mg Tabs (Hydrochlorothiazide) .... Take One Tablet By Mouth Daily.  Current Medications (verified): 1)  Amlodipine Besylate 10 Mg Tabs (Amlodipine Besylate) .... Take 1 Tablet Once A Day 2)  Simvastatin 40 Mg Tabs (Simvastatin) .... Take 1 Tablet By Mouth At Bedtime 3)  Foradil Aerolizer 12 Mcg  Caps (Formoterol Fumarate) .[name].. 1 Puff Two Times A Day 4)  Pulmicort Flexhaler 180 Mcg/act  Inha (Budesonide (Inhalation)) .[name].. 1 Puff Two Times A Day 5)  Prozac 40 Mg  Caps (Fluoxetine Hcl) .[name].. 1 By Mouth Once Daily 6)  Fish Oil Concentrate 1000 Mg  Caps (Omega-3 Fatty Acids) .... 2 Caps By Mouth Two Times A Day 7)  Abilify 5 Mg Tabs (Aripiprazole) .... Take 1 Tablet By Mouth Once A Day 8)  Proair Hfa 108 (90 Base) Mcg/act Aers (Albuterol Sulfate) .... 2 Puffs Four Times A Day As Needed Rescue 9)  Ms Contin 52 Mg  Xr12h-Tab (Morphine Sulfate) .[name].. 1 By Mouth Two Times A Day 10)  Flexeril 10 Mg Tabs (Cyclobenzaprine Hcl) .[name].. 1 By Mouth Three Times A Day 11)  Fenofibrate 160 Mg Tabs (Fenofibrate) .... Take One Tablet By Mouth Daily With A Meal 12)  Hydrochlorothiazide 25 Mg Tabs (Hydrochlorothiazide) .... Take One Tablet By Mouth Daily.  Allergies (verified): 1)  Aspirin (Aspirin) 2)  Codeine Phosphate (Codeine Phosphate)  Past History:  Past Medical History: Last updated: 11/22/2008 CAD (Left Main 30% stenosis, LAD 20 - 30 % stenosis, first and second diagonal branchesat 40 - 50% stenosis with small arteries, circumflex had 30% stenosis in the large obtuse marginal, RCA at 70 - 80% stenosis [not felt to be occlusive after evaluation with flow wire], distal 50 - 60% stenosis). Well preserved ejection fraction COPD Depression / anxiety Hyperlipidemia (followed in lipid clinic) Chronic Insomnia Ongoing tobacco abuse (1 pack per day) Gout GERD Peripheral vascular disease (ABI 0.9 on right and 0.89 on left) Degeneraive disk disease  Past Surgical History: Last updated: 01/17/2009 S/P Spinal fusion L4-L5  03/05/07 Left hip surgery left arm surgery right shoulder surgery C- spine surgery  Family History: Last updated: Dec 31, 2007 Father died at age 6 after a noncardiac surgery. Mother died at age 65 of myocardial infarction Sister is alive at age 81, she had myocardial infarction at age 21 has emphysema  Social History: Last updated: 05/01/2010 Occupation: welder Current Smoker 1.5ppd Alcohol use-no Drug use-no He had 43 year old son who died in a car accident several years ago.  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 Alcohol use-no Drug use-no He had 71 year old son who died in a car accident several years ago.Packs/Day:  1.5  Review of Systems      See HPI  Vital  Signs:  Patient profile:   55 year old male Height:      67 inches Weight:      189 pounds BMI:     29.71 O2 Sat:      96 % on Room air Temp:     99.4 degrees F oral Pulse rate:   75 / minute BP sitting:   144 / 74  (left arm) Cuff size:   regular  Vitals Entered By: Boone Master CNA/MA (May 01, 2010 10:14 AM)  O2 Flow:  Room air CC: prod cough with green/brown mucus, sinus pressure/congestion with PND, sore throat, increased SOB, wheezing, chills and states eyes are matted together in the mornings x5days Is Patient Diabetic? No Comments Medications reviewed with patient Daytime contact number verified with patient. Boone Master CNA/MA  May 01, 2010 10:14 AM    Physical Exam  Additional Exam:  General: A/Ox3; pleasant and cooperative, NAD, overweight, beard SKIN: no rash, lesions NODES: no lymphadenopathy HEENT: [place]/AT, EOM- WNL, Conjuctivae- clear, PERRLA, TM-WNL, Nose- clear discharge  Throat- Melampatti III, red NECK: Supple w/ fair ROM, JVD- none, normal carotid impulses w/o  bruits Thyroid- normal to palpation CHEST: decreased with bilateral mild rhonchi HEART: RRR, no m/g/r heard ABDOMEN: Soft and nl;  ZOX:WRUE, nl pulses, no edema  NEURO: Grossly intact to observation      Impression & Recommendations:  Problem # 1:  ACUTE BRONCHITIS (ICD-466.0)  w/ asscoiated rhinitis REC:  xopenex neb in office  smoking cesstation he has a quit date for 1 week from now.  Augmentin 875mg  two times a day for 10 days  Mucinex DM two times a day as needed cough/congesiton  Saline nasal rinses as needed  Please contact office for sooner follow up if symptoms do not improve or worsen  Most important is to stop smoking.  Hold fish oil until cough is gone.  His updated medication list for this problem includes:    Foradil Aerolizer 12 Mcg Caps (Formoterol fumarate) .[name]... 1 puff two times a day    Pulmicort Flexhaler 180 Mcg/act Inha (Budesonide (inhalation)) .[name]... 1 puff two  times a day    Proair Hfa 108 (90 Base) Mcg/act Aers (Albuterol sulfate) .[name]... 2 puffs four times a day as needed rescue    Augmentin 875-125 Mg Tabs (Amoxicillin-pot clavulanate) .[name]... 1 by mouth two times a day  Orders: Est. Patient Level III (45409)  Medications Added to Medication List This Visit: 1)  Fish Oil Concentrate 1000 Mg Caps (Omega-3 fatty acids) .... 2 caps by mouth two times a day 2)  Ms Contin 60 Mg Xr12h-tab (Morphine sulfate) .[name].. 1 by mouth two times a day 3)  Flexeril 10 Mg Tabs (Cyclobenzaprine hcl) .[name].. 1 by mouth three times a day 4)  Augmentin 875-125 Mg Tabs (Amoxicillin-pot clavulanate) .[name].. 1 by mouth two times a day  Complete Medication List: 1)  Amlodipine Besylate 10 Mg Tabs (Amlodipine besylate) .... Take 1 tablet once a day 2)  Simvastatin 40 Mg Tabs (Simvastatin) .... Take 1 tablet by mouth at bedtime 3)  Foradil Aerolizer 12 Mcg Caps (Formoterol fumarate) .[name].. 1 puff two times a day 4)  Pulmicort Flexhaler 180 Mcg/act Inha (Budesonide (inhalation)) .[name].. 1 puff two times a day 5)  Prozac 40 Mg Caps (Fluoxetine hcl) .[name].. 1 by mouth once daily 6)  Fish Oil Concentrate 1000 Mg Caps (Omega-3 fatty acids) .... 2 caps by mouth two times a day 7)  Abilify 5 Mg Tabs (Aripiprazole) .... Take 1 tablet by mouth once a day 8)  Proair Hfa 108 (90 Base) Mcg/act Aers (Albuterol sulfate) .... 2 puffs four times a day as needed rescue 9)  Ms Contin 60 Mg Xr12h-tab (Morphine sulfate) .[name].. 1 by mouth two times a day 10)  Flexeril 10 Mg Tabs (Cyclobenzaprine hcl) .[name].. 1 by mouth three times a day 11)  Fenofibrate 160 Mg Tabs (Fenofibrate) .... Take one tablet by mouth daily with a meal 12)  Hydrochlorothiazide 25 Mg Tabs (Hydrochlorothiazide) .... Take one tablet by mouth daily. 13)  Augmentin 875-125 Mg Tabs (Amoxicillin-pot clavulanate) .[name].. 1 by mouth two times a day  Patient Instructions: 1)  Augmentin 875mg  two times a day for 10 days  2)  Mucinex DM two times a day  as needed cough/congesiton  3)  Saline nasal rinses as needed  4)  Please contact office for sooner follow up if symptoms do not improve or worsen  5)  Most important is to stop smoking.  6)  Hold fish oil until cough is gone.  Prescriptions: AUGMENTIN 875-125 MG TABS (AMOXICILLIN-POT CLAVULANATE) 1 by mouth two times a day  #20  x 0   Entered and Authorized by:   Rubye Oaks NP   Signed by:   Amra Shukla NP on 05/01/2010   Method used:   Electronically to        [facility] [address] [phone]* (retail)       [address]       [place], [place]  [address]       Ph: [phone] or [phone]       Fax: [phone]   RxID:   [id number]     Appended Document: Orders Update - neb tx     Clinical Lists Changes  Orders: Added new Service order of Nebulizer Tx (71062) - Signed

## 2010-12-27 NOTE — Assessment & Plan Note (Signed)
Summary: Ernest [name]   Primary Ernest [name]/Referring Ernest [name]:  Ernest Spry DO  CC:  [name] - c/o increased sob for 1 month - mostly with exertion - denies cough or chest pain - c/o occas wheezing.  History of Present Illness: December 19, 2009- COPD, tobacco, hx lung nodule Has increased wheeze and dyspnea after a viral bronchitis syndrome that had included fever and green mucus at first. He feels he is on the mend. Had flu vax. Has reduced smoking from 3 ppd to 1/2 ppd. Wife supports his efforts. She tells him he snores but not apnea. He notices mostly dificulty with sleep onset. A sleep study 2 years ago he reports was negative.  May 01, 2010--Presents for prod cough with green/brown mucus, sinus pressure/congestion with PND, sore throat, increased SOB, wheezing, chills and states eyes are matted together in the mornings x5days. No otc used.  Denies chest pain, dyspnea, orthopnea, hemoptysis, fever, n/v/d, edema, headache,recent travel or antibiotics.    August 14, 2010- COPD, tobacco, hx lung nodule, ? OSA  ..............Ernest Kitchenwife here last here for neb Rx with acute visit to the NP in June. Working with cardilogist on medically treated CAD. Noticing more DOE over past month- any light exertion such as stairs. Feels weight on his chest and only Foradil relieves it. Not much cough or bothersome wheeze. Uses Proair once/ week, Foradil every other day and seems to be stronger, opening him better. Not using pulmicort regularly. Med talk done.  Volunteers that he is sleeping a lot. Wife says he snores. He had a sleep study many years ago- results unknown. Has reduced but not quit smoking. We discussed support available.    Preventive Screening-Counseling & Management  Alcohol-Tobacco     Smoking Status: current     Packs/Day: <0.25     Year Started: Started 55 years old     Tobacco Counseling: to quit use of tobacco products  Comments: Was 1 PPD, now down to 3 cigs/ day.  Current Medications  (verified): 1)  Amlodipine Besylate 10 Mg Tabs (Amlodipine Besylate) .... Take 1 Tablet Once A Day 2)  Foradil Aerolizer 12 Mcg  Caps (Formoterol Fumarate) .Ernest [name].. 1 Puff Two Times A Day 3)  Pulmicort Flexhaler 180 Mcg/act  Inha (Budesonide (Inhalation)) .Ernest [name].. 1 Puff Two Times A Day 4)  Prozac 40 Mg  Caps (Fluoxetine Hcl) .Ernest [name].. 1 By Mouth Once Daily 5)  Fish Oil Concentrate 1000 Mg  Caps (Omega-3 Fatty Acids) .... Take 1 Capsule By Mouth Three Times A Day 6)  Proair Hfa 108 (90 Base) Mcg/act Aers (Albuterol Sulfate) .... 2 Puffs Four Times A Day As Needed Rescue 7)  Ms Contin 25 Mg Xr12h-Tab (Morphine Sulfate) .Ernest [name].. 1 By Mouth Two Times A Day 8)  Flexeril 10 Mg Tabs (Cyclobenzaprine Hcl) .... As Needed 9)  Carvedilol 25 Mg Tabs (Carvedilol) .... Take One Tablet By Mouth Twice A Day 10)  Remeron 45 Mg Tabs (Mirtazapine) .... Take 1 Tablet By Mouth Once A Day 11)  Clonazepam 1 Mg Tabs (Clonazepam) .... As Needed 12)  Lipitor 20 Mg Tabs (Atorvastatin Calcium) .... One Daily 13)  Imdur 30 Mg Xr24h-Tab (Isosorbide Mononitrate) .Ernest [name].. 1 By Mouth Daily  Allergies (verified): 1)  Aspirin (Aspirin) 2)  Codeine Phosphate (Codeine Phosphate)  Comments:  Nurse/Medical Assistant: The patient's medications and allergies were reviewed with the patient and were updated in the Medication and Allergy Lists.  Past History:  Past Medical History: Last updated: 05/15/2010 CAD (Left Main 30% stenosis, LAD 20 -  30 % stenosis, first and second diagonal branchesat 40 - 50% stenosis with small arteries, circumflex had 30% stenosis in the large obtuse marginal, RCA at 70 - 80% stenosis [not felt to be occlusive after evaluation with flow wire], distal 50 - 60% stenosis). Well preserved ejection fraction COPD  Depression / anxiety Hyperlipidemia (followed in lipid clinic) Chronic Insomnia Ongoing tobacco abuse (1 pack per day) Gout GERD Peripheral vascular disease (ABI 0.9 on right and 0.89 on left)  severe left  external iliac stenosis.  He had successful stenting of his left external iliac per Dr. Excell [name].  Degeneraive disk disease  Past Surgical History: Last updated: 06/12/2010 S/P Spinal fusion L4-L5  03/05/07 Left hip surgery Left arm surgery Right shoulder surgery  C- spine surgery   Family History: Last updated: 01-Jun-2010 Father died at age 52 after a noncardiac surgery. Mother died at age 28 of myocardial infarction Sister is alive at age 12, she had myocardial infarction at age 38 has emphysema   Social History: Last updated: June 01, 2010 Occupation: Psychologist, occupational (disabled) Current Smoker 4 cig living with wife - married 32 Alcohol use-no Drug use-no He had 78 year old son who died in a car accident several years ago.  Risk Factors: Smoking Status: current (08/14/2010) Packs/Day: <0.25 (08/14/2010)  Social History: Packs/Day:  <0.25  Review of Systems      See HPI       The patient complains of dyspnea on exertion.  The patient denies anorexia, fever, weight loss, weight gain, vision loss, decreased hearing, hoarseness, chest pain, syncope, peripheral edema, prolonged cough, headaches, hemoptysis, abdominal pain, melena, severe indigestion/heartburn, unusual weight change, abnormal bleeding, enlarged lymph nodes, and angioedema.    Vital Signs:  Patient profile:   55 year old male Height:      67 inches Weight:      193 pounds BMI:     30.34 O2 Sat:      98 % on Room air Pulse rate:   67 / minute BP sitting:   138 / 70  (left arm) Cuff size:   regular  Vitals Entered By: Abigail Miyamoto RN (August 14, 2010 1:50 PM)  O2 Flow:  Room air  Physical Exam  Additional Exam:  General: A/Ox3; pleasant and cooperative, NAD, overweight, beard SKIN: no rash, lesions NODES: no lymphadenopathy HEENT: [place]/AT, EOM- WNL, Conjuctivae- clear, PERRLA, TM-WNL, Nose- clear discharge  Throat- Mallampati III, red NECK: Supple w/ fair ROM, JVD- none, normal carotid impulses w/o bruits  Thyroid- normal to palpation CHEST: clear to P&A HEART: RRR, no m/g/r heard ABDOMEN: Soft and nl;  ZOX:WRUE, nl pulses, no edema  NEURO: Grossly intact to observation      Impression & Recommendations:  Problem # 1:  TOBACCO USER (ICD-305.1)  Smoking cessation is strictly emphasized.  Problem # 2:  COPD (ICD-496) Aware of exertional dyspnea.  PFT done 2 years ago. We will emphasize maintenance use of his pulmicort for now and see how he does., Update CXR, Flu vax.  Problem # 3:  Hx of LUNG NODULE (ICD-518.89)  Will do f/u CXR. His smoking hx was discussed as a long term risk.  Orders: Est. Patient Level IV (99214) T-2 View CXR (71020TC)  Problem # 4:  ? of OBSTRUCTIVE SLEEP APNEA (ICD-327.23) Snoring and comments from his wife were reviewed. His hypersomnia and body weight suggest OSA. We discussed and he agreed to a sleep study.  Other Orders: Sleep Disorder Referral (Sleep Disorder) Admin 1st Vaccine (45409) Flu Vaccine 55yrs + (81191)  Patient Instructions: 1)  Please schedule a follow-up appointment in 1 month. 2)  See PCC to schedule sleep study 3)  Flu vax 4)  Use the Pulmicort twice daily every day for prevention and control Flu Vaccine Consent Questions     Do you have a history of severe allergic reactions to this vaccine? no    Any prior history of allergic reactions to egg and/or gelatin? no    Do you have a sensitivity to the preservative Thimersol? no    Do you have a past history of Guillan-Barre Syndrome? no    Do you currently have an acute febrile illness? no    Have you ever had a severe reaction to latex? no    Vaccine information given and explained to patient? yes    Are you currently pregnant? no    Lot Number:[id number]   Exp Date:05/25/2011   Site Given  Left Deltoid IM 3)  Flu vax 4)  Use the Pulmicort twice daily every day for prevention and control   .lbflu

## 2010-12-27 NOTE — Miscellaneous (Signed)
Summary: Orders Update  Clinical Lists Changes  Orders: Added new Service order of No Show NS50 (NS50) - Signed 

## 2010-12-27 NOTE — Assessment & Plan Note (Signed)
Summary: Cardiology Nuclear Testing  Nuclear Med Background Indications for Stress Test: Evaluation for Ischemia   History: Heart Catheterization, Myocardial Perfusion Study  History Comments: 2007 Heart Cath Moderate CAD treated Medically 1/10 MPS NL, EF NL  Symptoms: Chest Pain, SOB  Symptoms Comments: Chronic SOB   Nuclear Pre-Procedure Cardiac Risk Factors: Family History - CAD, History of Smoking, Hypertension, Lipids, PVD, Smoker Caffeine/Decaff Intake: none NPO After: 10:00 PM Lungs: BBS clear IV 0.9% NS with Angio Cath: 22g     IV Site: R hand IV Started by: CHasspacherRN Chest Size (in) 42     Height (in): 67 Weight (lb): 188 BMI: 29.55 Tech Comments: Pt held Carvedilol this am dose.  Nuclear Med Study 1 or 2 day study:  1 day     Stress Test Type:  Lexiscan with low level exercise Reading MD:  Willa Rough, MD     Referring MD:  Rollene Rotunda Resting Radionuclide:  Technetium 86m Tetrofosmin     Resting Radionuclide Dose:  11 mCi  Stress Radionuclide:  Technetium 14m Tetrofosmin     Stress Radionuclide Dose:  33 mCi   Stress Protocol   Lexiscan: 0.4 mg   Stress Test Technologist:  Stanton Kidney EMT-P     Nuclear Technologist:  Domenic Polite CNMT  Rest Procedure  Myocardial perfusion imaging was performed at rest 45 minutes following the intravenous administration of Myoview Technetium 55m Tetrofosmin.  Stress Procedure  The patient received IV Lexiscan 0.4 mg over 15-seconds with concurrent submaximal exercise (1.63mph, 0% grade).  Myoview injected at 30-seconds.  There were no significant changes with infusion.  Quantitative spect images were obtained after a 45 minute delay.  QPS Raw Data Images:  Normal; no motion artifact; normal heart/lung ratio. Stress Images:  Moderate decrease in activity in the inferolateral wall Rest Images:  Mild decrease in activity in the inferolateral wall Subtraction (SDS):  Ischemia present Transient Ischemic Dilatation:   1.08  (Normal <1.22)  Lung/Heart Ratio:  .33  (Normal <0.45)  Quantitative Gated Spect Images QGS EDV:  101 ml QGS ESV:  34 ml QGS EF:  66 % QGS cine images:  Normal motion  Findings Abnormal      Overall Impression  Exercise Capacity: Lexiscan BP Response: Normal blood pressure response. Clinical Symptoms: Nausea ECG Impression: No significant ST segment change suggestive of ischemia. Overall Impression Comments: There is moderate ischemia in the inferolateral wall.  Appended Document: Cardiology Nuclear Testing Discussed with the patient.  Abnormal stress perfusion new since 2010.  Needs cardiac cath.  Appended Document: Cardiology Nuclear Testing pt scheduled for 07/17/2010 in the [facility] at 12n.

## 2010-12-27 NOTE — Progress Notes (Signed)
Summary: Nuc Pre-Procedure  Phone Note Outgoing Call   Call placed by: Antionette Char RN,  July 05, 2010 10:37 AM Call placed to: Patient Reason for Call: Confirm/change Appt Summary of Call: Reviewed information on Myoview Information Sheet (see scanned document for further details).  Spoke with wife.

## 2010-12-27 NOTE — Progress Notes (Signed)
Summary: COUGH  Phone Note Call from Patient Call back at [phone]   Caller: Patient Call For: YOUNG Summary of Call: PT STILL HAVE COUGH [facility] Initial call taken by: Rickard Patience,  May 02, 2010 1:31 PM  Follow-up for Phone Call        Pt wife called on his behalf becuase he was up all night coughing and they are requesting that an rx be called in for cough syrup to help supress cough or they want to come in for shot. Please advise. Carron Curie CMA  May 02, 2010 1:48 PM   Additional Follow-up for Phone Call Additional follow up Details #1::        tessalon 200mg  three times a day as needed cough  #30, no refills.  he is on ms contini. would avoid more narcotics  if not better ov w/ Dr. Maple Hudson  Additional Follow-up by: Rubye Oaks NP,  May 03, 2010 9:26 AM    Additional Follow-up for Phone Call Additional follow up Details #2::    rx sent. pt wife aware of recs.Carron Curie CMA  May 03, 2010 9:54 AM   New/Updated Medications: TESSALON 200 MG CAPS (BENZONATATE) Take 1 capsule by mouth three times a day as needed cough Prescriptions: TESSALON 200 MG CAPS (BENZONATATE) Take 1 capsule by mouth three times a day as needed cough  #30 x 0   Entered by:   Carron Curie CMA   Authorized by:   Waymon Budge MD   Signed by:   Carron Curie CMA on 05/03/2010   Method used:   Electronically to        [facility] [address] [phone]* (retail)       [address]       [place], [place]  [address]       Ph: [phone] or [phone]       Fax: [phone]   RxID:   [id number]

## 2010-12-27 NOTE — Progress Notes (Signed)
Summary: SAMPLES  Phone Note Call from Patient Call back at [phone]   Caller: Patient Call For: YOUNG Summary of Call: [facility] FOR SAMPLES OF FORADIL Initial call taken by: Rickard Patience,  Apr 19, 2010 10:52 AM  Follow-up for Phone Call        advised we do not have any samples of foradil yet. Wife states that she completed some financial assistance forms for pt meds on 04-09-10 when she came in for a shot and gave to [facility] nurse. Katie, do you know anything about these forms? Please advise. Carron Curie CMA  Apr 19, 2010 11:00 AM    I have not seen any of these papers; The 04-09-10 document shows where [facility] printed and gave RX's to patients wife when here. Please make sure the wife did not take the RX's and paperwork with her to mail to company. Nothing on [facility]'s cart as well.Reynaldo Minium CMA  Apr 19, 2010 4:03 PM   Additional Follow-up for Phone Call Additional follow up Details #1::        Spoke with pt's spouse.  She states that she filled out the forms on a clip board and thinks that she gave them to the front desk to give to [place].  I advised that we did get some samples of foradil and will leave these up front for pick up. Additional Follow-up by: Vernie Murders,  Apr 19, 2010 4:17 PM    Additional Follow-up for Phone Call Additional follow up Details #2::    Spoke with pt's wife; assistance program was for herself not the patient; she is aware that assistance will not be given to her as she still has insurance coverage. Samples of Foradil left at front for pt and samples for herself left at front also. Reynaldo Minium CMA  Apr 19, 2010 4:58 PM

## 2010-12-27 NOTE — Progress Notes (Signed)
Summary: discuss imdur  Phone Note Call from Patient Call back at Home Phone [phone]   Caller: Spouse-anita  Reason for Call: Talk to Nurse Summary of Call: pt wife wants to discuss imdur 30 mg.  Initial call taken by: Lorne Skeens,  July 26, 2010 3:02 PM  Follow-up for Phone Call        taking isosorbide and is a bit groggy on it.  taking it at night.  Explained to wife how nitrates work and how they effect the vascular system.  Generally, grogginess is not a side effect that people complain of on this medication but rather headaches and dizziness.  Also reviewed carvedilol and its side effects.  Wife states understanding.  Wife is concerned about pts diet and low cholestrol diet information will be mailed to their home address.  She had no further questions as we finished our call but was encouraged to call back if any should araise. Follow-up by: Charolotte Capuchin, RN,  July 26, 2010 4:22 PM

## 2010-12-27 NOTE — Assessment & Plan Note (Signed)
Summary: rov//mbw   Primary Provider/Referring Provider:  Artist Pais  CC:  follow up visit-decrease in breathing since cold weather; Increased SOB and wheezing.[name]  History of Present Illness: 11/22/08-COPD Lying on right side gets right parasternal pain, also if turns head his known degenerative arthritis changes cause similar pain. Still DOE with sustained walking. A little wheeze lying down. No phlegm. Quit smoking 3 months ago- using Chantix.  CXR was c/w copd, no nodule.  01/17/09- COPD, tobacco use, hx lung nodule Had c-spine surgery Feb 11. Felt bronchitis going into surgery but cxr was neg for acute., He is now wearing a soft collar. Was better til today more hoarse, tired, poor appetite, Not able to cough productively.  05/19/09- COPD, tobacco use, hx lung nodule Says 5 months increased short of breath, noticed more by end of day. Hot shower smothers him. Hot weather makes him worse. Cough little. Smokes 1 cg/ day. Feet swell. Notes hot flashes.Denies blood, purulence, chest pain, palpitation. Using Foradil and Pulmicort two times a day without  a rescue inhaler. FEV1/FVC was 0.59 in 7/09.  December 19, 2009- COPD, tobacco, hx lung nodule Has increased wheeze and dyspnea after a viral bronchitis syndrome that had included fever and green mucus at first. He feels he is on the mend. Had flu vax. Has reduced smoking from 3 ppd to 1/2 ppd. Wife supports his efforts. She tells him he snores but not apnea. He notices mostly dificulty with sleep onset. A sleep study 2 years ago he reports was negative.   Current Medications (verified): 1)  Amlodipine Besylate 10 Mg Tabs (Amlodipine Besylate) .... Take 1 Tablet Once A Day 2)  Simvastatin 40 Mg Tabs (Simvastatin) .... Take 1 Tablet By Mouth At Bedtime 3)  Foradil Aerolizer 12 Mcg  Caps (Formoterol Fumarate) .[name].. 1 Puff Two Times A Day 4)  Pulmicort Flexhaler 180 Mcg/act  Inha (Budesonide (Inhalation)) .[name].. 1 Puff Two Times A Day 5)  Prozac 40 Mg   Caps (Fluoxetine Hcl) .[name].. 1 By Mouth Once Daily 6)  Fish Oil Concentrate 1000 Mg  Caps (Omega-3 Fatty Acids) .... 2 Caps By Mouth Bid 7)  Abilify 5 Mg Tabs (Aripiprazole) .... Take 1 Tablet By Mouth Once A Day 8)  Proair Hfa 108 (90 Base) Mcg/act Aers (Albuterol Sulfate) .... 2 Puffs Four Times A Day As Needed Rescue 9)  Ms Contin 70 Mg Xr12h-Tab (Morphine Sulfate) .[name].. 1 By Mouth Bid 10)  Flexeril 10 Mg Tabs (Cyclobenzaprine Hcl) .[name].. 1 By Mouth Tid 11)  Fenofibrate 160 Mg Tabs (Fenofibrate) .... Take One Tablet By Mouth Daily With A Meal 12)  Hydrochlorothiazide 25 Mg Tabs (Hydrochlorothiazide) .... Take One Tablet By Mouth Daily.  Allergies (verified): 1)  Aspirin (Aspirin) 2)  Codeine Phosphate (Codeine Phosphate)  Past History:  Past Medical History: Last updated: 11/22/2008 CAD (Left Main 30% stenosis, LAD 20 - 30 % stenosis, first and second diagonal branchesat 40 - 50% stenosis with small arteries, circumflex had 30% stenosis in the large obtuse marginal, RCA at 70 - 80% stenosis [not felt to be occlusive after evaluation with flow wire], distal 50 - 60% stenosis). Well preserved ejection fraction COPD Depression / anxiety Hyperlipidemia (followed in lipid clinic) Chronic Insomnia Ongoing tobacco abuse (1 pack per day) Gout GERD Peripheral vascular disease (ABI 0.9 on right and 0.89 on left) Degeneraive disk disease  Past Surgical History: Last updated: 01/17/2009 S/P Spinal fusion L4-L5  03/05/07 Left hip surgery left arm surgery right shoulder surgery C- spine surgery  Family History: Last updated: 2008-01-11 Father died at age 27 after a noncardiac surgery. Mother died at age 8 of myocardial infarction Sister is alive at age 47, she had myocardial infarction at age 24 has emphysema  Social History: Last updated: 05/26/2008 Occupation: welder Current Smoker Alcohol use-no Drug use-no He had 30 year old son who died in a car accident several years  ago.  Risk Factors: Smoking Status: current (05/22/2009) Packs/Day: 2-3 (09/08/2007)  Review of Systems      See HPI  The patient denies anorexia, fever, weight loss, weight gain, vision loss, decreased hearing, hoarseness, chest pain, syncope, dyspnea on exertion, peripheral edema, prolonged cough, headaches, hemoptysis, and severe indigestion/heartburn.    Vital Signs:  Patient profile:   55 year old male Height:      67 inches Weight:      189.13 pounds BMI:     29.73 O2 Sat:      96 % on Room air Pulse rate:   77 / minute BP sitting:   142 / 88  (left arm) Cuff size:   regular  Vitals Entered By: Reynaldo Minium CMA (December 19, 2009 9:37 AM)  O2 Flow:  Room air  Physical Exam  Additional Exam:  General: A/Ox3; pleasant and cooperative, NAD, overweight, beard SKIN: no rash, lesions NODES: no lymphadenopathy HEENT: [place]/AT, EOM- WNL, Conjuctivae- clear, PERRLA, TM-WNL, Nose- clear, Throat- Melampatti III, red NECK: Supple w/ fair ROM, JVD- none, normal carotid impulses w/o bruits Thyroid- normal to palpation CHEST: decreased with bilateral mild rhonchi HEART: RRR, no m/g/r heard ABDOMEN: Soft and nl;  EAV:WUJW, nl pulses, no edema  NEURO: Grossly intact to observation      Impression & Recommendations:  Problem # 1:  ACUTE BRONCHITIS (ICD-466.0)  We will refill regular meds. His updated medication list for this problem includes:    Foradil Aerolizer 12 Mcg Caps (Formoterol fumarate) .[name]... 1 puff two times a day    Pulmicort Flexhaler 180 Mcg/act Inha (Budesonide (inhalation)) .[name]... 1 puff two times a day    Proair Hfa 108 (90 Base) Mcg/act Aers (Albuterol sulfate) .[name]... 2 puffs four times a day as needed rescue  Problem # 2:  TOBACCO USER (ICD-305.1)  Continued discussion and encouragement to stop, with options reviewed.  Other Orders: Est. Patient Level III (11914) Tobacco use cessation intermediate 3-10 minutes (78295)  Patient Instructions: 1)  Please  schedule a follow-up appointment in 6 months. 2)  Keep trying to cut down and quit cigarettes 3)  Scripts for proair and foradil sent to your drug store Prescriptions: PROAIR HFA 108 (90 BASE) MCG/ACT AERS (ALBUTEROL SULFATE) 2 puffs four times a day as needed rescue  #1 x prn   Entered and Authorized by:   Waymon Budge MD   Signed by:   Waymon Budge MD on 12/19/2009   Method used:   [facility]ically to        [facility] [address] [phone]* (retail)       [address]       [place], [place]  [address]       Ph: [phone] or [phone]       Fax: [phone]   RxID:   [id number] FORADIL AEROLIZER 12 MCG  CAPS (FORMOTEROL FUMARATE) 1 PUFF two times a day  #60 x prn   Entered and Authorized by:   Waymon Budge MD   Signed by:   Waymon Budge MD on 12/19/2009  Method used:   Electronically to        [facility] [address] [phone]* (retail)       [address]       [place], [place]  [address]       Ph: [phone] or [phone]       Fax: [phone]   RxID:   [id number]

## 2010-12-27 NOTE — Letter (Signed)
Summary: Medical Report Form/NCDMV  Medical Report Form/NCD[facility]   Imported By: Lanelle Bal 05/23/2010 08:31:47  _____________________________________________________________________  External Attachment:    Type:   Image     Comment:   External Document

## 2010-12-27 NOTE — Progress Notes (Signed)
  Records faxed to [facility] @ [phone] [facility]  August 29, 2010 11:21 AM

## 2011-01-02 NOTE — Progress Notes (Signed)
Summary: SOB / rx request  Phone Note Call from Patient Call back at Home Phone [phone]   Caller: Patient Call For: Ernest [name] Summary of Call: pt c/o SOB since last wk. (COPD). [facility] Initial call taken by: Tivis Ringer, CNA,  December 24, 2010 10:22 AM  Follow-up for Phone Call        Spoke with pt and he is c/o increased SOB, chest tightness, and wheezing. He denies cough. He states he has been having to use his albuterol inhaler more than usual.  Pt staets symptoms have been like this x 2 weeks. Please advise. Pt was given a zpak 10-30-11 and  11-08-11. Carron Curie CMA  December 24, 2010 11:11 AM allergies: asa, codeine  Additional Follow-up for Phone Call Additional follow up Details #1::        Per [facility]-okay to give Prednisone 10mg  #20 take 4 x 2 days, 3 x 2 days, 2 x 2 days, 1  x 2 days no refills.Reynaldo Minium CMA  December 24, 2010 11:54 AM   rx sent. pt aware of recs.Carron Curie CMA  December 24, 2010 12:11 PM     New/Updated Medications: PREDNISONE 10 MG TABS (PREDNISONE) take 4 x 2 days, 3 x 2 days, 2 x 2 days, 1  x 2 days Prescriptions: PREDNISONE 10 MG TABS (PREDNISONE) take 4 x 2 days, 3 x 2 days, 2 x 2 days, 1  x 2 days  #20 x 0   Entered by:   Carron Curie CMA   Authorized by:   Waymon Budge MD   Signed by:   Carron Curie CMA on 12/24/2010   Method used:   Electronically to        [facility] [address] [phone]* (retail)       [address]       [place], [place]  [address]       Ph: [phone] or [phone]       Fax: [phone]   RxID:   [id number]

## 2011-03-11 ENCOUNTER — Other Ambulatory Visit: Payer: Self-pay | Admitting: Cardiology

## 2011-03-12 LAB — CBC
Hemoglobin: 14.5 g/dL (ref 13.0–17.0)
MCHC: 34.5 g/dL (ref 30.0–36.0)
MCV: 90.4 fL (ref 78.0–100.0)
RBC: 4.65 MIL/uL (ref 4.22–5.81)

## 2011-03-12 LAB — BASIC METABOLIC PANEL
CO2: 29 mEq/L (ref 19–32)
Chloride: 103 mEq/L (ref 96–112)
Creatinine, Ser: 0.78 mg/dL (ref 0.4–1.5)
GFR calc Af Amer: >60 mL/min (ref >60)
Sodium: 140 mEq/L (ref 135–145)

## 2011-03-28 ENCOUNTER — Telehealth: Payer: Self-pay | Admitting: Internal Medicine

## 2011-03-28 MED ORDER — PREDNISONE 10 MG PO TABS: ORAL_TABLET | ORAL | Status: DC

## 2011-03-28 MED ORDER — HYDROCODONE-HOMATROPINE 5-1.5 MG/5ML PO SYRP: ORAL_SOLUTION | ORAL | Status: DC

## 2011-03-28 NOTE — Telephone Encounter (Signed)
Spoke w/ pt and he c/o bad cough w/ white phlem x 1 week. Pt states he ahs not been able to sleep or use his cpap b/c his cough keeps him up. Pt states he has had some increased SOB, and feels very congested. Pt states he has tried mucinex 600 mg and has had no relief. Pt was last seen 10/29/10 and has no pending apts. Please advise Dr. Maple Hudson. Thanks  Allergies  Allergen Reactions  . Aspirin     REACTION: unspecified  . Codeine Phosphate     REACTION: unspecified    Carver Fila, CMA

## 2011-03-28 NOTE — Telephone Encounter (Signed)
Per cdy pred 10 mg 4 x 2 days, 3 x 2 days, 2 x 2 days, 1 x2 days, then stop and hydromet 1 tsp q 6 hrs prn. Spoke w/ pt and advised him of cdy recs. Pt verbalized understanding and is aware rx was sent to pharmacy

## 2011-04-01 ENCOUNTER — Ambulatory Visit (INDEPENDENT_AMBULATORY_CARE_PROVIDER_SITE_OTHER): Payer: Medicare PPO | Admitting: Internal Medicine

## 2011-04-01 ENCOUNTER — Encounter: Payer: Self-pay | Admitting: Internal Medicine

## 2011-04-01 VITALS — BP 126/72 | HR 78 | Ht 67.0 in | Wt 202.2 lb

## 2011-04-01 DIAGNOSIS — F17200 Nicotine dependence, unspecified, uncomplicated: Secondary | ICD-10-CM

## 2011-04-01 DIAGNOSIS — J449 Chronic obstructive pulmonary disease, unspecified: Secondary | ICD-10-CM

## 2011-04-01 DIAGNOSIS — G4733 Obstructive sleep apnea (adult) (pediatric): Secondary | ICD-10-CM

## 2011-04-01 DIAGNOSIS — J209 Acute bronchitis, unspecified: Secondary | ICD-10-CM

## 2011-04-01 MED ORDER — ALBUTEROL SULFATE (2.5 MG/3ML) 0.083% IN NEBU: 2.5000 mg | INHALATION_SOLUTION | RESPIRATORY_TRACT | Status: DC | PRN

## 2011-04-01 MED ORDER — PREDNISONE 10 MG PO TABS: ORAL_TABLET | ORAL | Status: DC

## 2011-04-01 MED ORDER — COMPRESSOR/NEBULIZER MISC: 1.0000 | Status: DC

## 2011-04-01 MED ORDER — VARENICLINE TARTRATE 0.5 MG PO TABS: ORAL_TABLET | ORAL | Status: DC

## 2011-04-01 MED ORDER — AZITHROMYCIN 250 MG PO TABS: ORAL_TABLET | ORAL | Status: AC

## 2011-04-01 NOTE — Patient Instructions (Addendum)
Scripts printed for Z pak and prednisone taper to hold.  Script printed for Chantix  Depo 80  Neb neo nasal  Script for nebulizer machine and for albuterol neb solution.

## 2011-04-01 NOTE — Assessment & Plan Note (Addendum)
Encouraged to go back on CPAP quickly.

## 2011-04-01 NOTE — Assessment & Plan Note (Signed)
Acute exacerbation of COPD. He and his wife appear to be sharing a viral bronchitis.  He is finishing prednisone- we will give another to hold. Give  zpak and depo with neb nasal today.

## 2011-04-01 NOTE — Assessment & Plan Note (Signed)
Retry Chantix with discussion and encouragement.

## 2011-04-01 NOTE — Progress Notes (Signed)
  Subjective:    Patient ID: Ernest [name], male    DOB: 16-May-1956, 55 y.o.   MRN: [id number]  HPI 04/01/11- 23 yo M current smoker, followed for sleep apnea, acute bronchitis and tobacco abuse, complicated by depression and CAD.  Last here- October 29, 2010. Wife here today Got through winter ok. Today reports being acutely ill over past month. Unable to use CPAP because it makes his lungs hurt. Increased cough and wheeze. Wife also sick with cough and some fever over last 2 days. He has come close to calling ambulance for his dyspnea. Depo and zpak helped at last visit. Has been on prednisone taper with 3 days left. Nasal congestion with frontal headache. Coughing productively thick white mucus. Can't sleep in bed, cites pressure in lungs for need to sleep sitting up.  He says he is ready to try Chantix again. They are reading pamphlet and we discussed. He tolerated it well at earlier try.   Review of Systems See HPI Constitutional:   No weight loss, night sweats,  Fevers, chills, fatigue, lassitude. HEENT:    Difficulty swallowing,  Tooth/dental problems,  Sore throat,                No sneezing, itching, ear ache,, post nasal drip,   CV:  No chest pain,  Orthopnea, PND, swelling in lower extremities, anasarca, dizziness, palpitations  GI  No heartburn, indigestion, abdominal pain, nausea, vomiting, diarrhea, change in bowel habits, loss of appetite  Resp:Pressure and shortness of breath with exertion and  at rest.  Excess mucus,  productive cough,   non-productive cough,  No coughing up of blood.  No change in color of mucus.  No wheezing.    Skin: no rash or lesions.  GU: no dysuria, change in color of urine, no urgency or frequency.  No flank pain.  MS:  No joint pain or swelling.  No decreased range of motion.  No back pain.  Psych:  No change in mood or affect. No depression or anxiety.  No memory loss.      Objective:   Physical Exam General- Alert, Oriented,  Affect-appropriate, Distress- none acute, but coughing and doesn't feel well  Skin- rash-none, lesions- none, excoriation- none  Lymphadenopathy- none  Head- atraumatic  Eyes- Gross vision intact, PERRLA, conjunctivae clear, secretions  Ears- Normal-  Hearing, canals, Tm   Nose- Clear, No-Septal dev, mucus, polyps, erosion, perforation   Throat- Mallampati III, mucosa clear , drainage- none, tonsils- atrophic  Neck- flexible , trachea midline, no stridor , thyroid nl, carotid no bruit  Chest - symmetrical excursion , unlabored     Heart/CV- RRR , no murmur , no gallop  , no rub, nl s1 s2                     - JVD- none , edema- none, stasis changes- none, varices- none     Lung-Bilateral wheeze and cough,          dullness-none, rub- none     Chest wall-   Abd- tender-no, Obese,      bowel sounds-present, HSM- no  Br/ Gen/ Rectal- Not done, not indicated  Extrem- cyanosis- none, clubbing, none, atrophy- none, strength- nl  Neuro- grossly intact to observation         Assessment & Plan:

## 2011-04-02 ENCOUNTER — Telehealth: Payer: Self-pay | Admitting: Internal Medicine

## 2011-04-02 MED ORDER — PROMETHAZINE-CODEINE 6.25-10 MG/5ML PO SYRP: 5.0000 mL | ORAL_SOLUTION | Freq: Four times a day (QID) | ORAL | Status: DC | PRN

## 2011-04-02 NOTE — Telephone Encounter (Signed)
Spouse aware of cough syrup rx and [facility] [address]

## 2011-04-02 NOTE — Telephone Encounter (Signed)
Pt notified [facility]et was [facility] [address] on 03/28/2011.

## 2011-04-02 NOTE — Telephone Encounter (Signed)
Addended by: Michel Bickers on: 04/02/2011 05:42 PM   Modules accepted: Orders

## 2011-04-02 NOTE — Telephone Encounter (Signed)
Per [facility]-okay to give phenergan with codeine #2109ml 1 tsp every 6 hours prn cough no refills.

## 2011-04-02 NOTE — Telephone Encounter (Signed)
Pt called back to say he has already picked up refill for Hydromet on 5/3 and is needing another refill. Pls advise.No Known Allergies

## 2011-04-04 ENCOUNTER — Telehealth: Payer: Self-pay | Admitting: Internal Medicine

## 2011-04-04 NOTE — Telephone Encounter (Signed)
Spoke w/ Synetta Fail pt wife and she is aware 1 sample of ventolin was left upfront for pick up. Nothing further was needed

## 2011-04-07 ENCOUNTER — Encounter: Payer: Self-pay | Admitting: Internal Medicine

## 2011-04-07 NOTE — Assessment & Plan Note (Addendum)
Sustained acute exacerbation with bronchitis. Viral etiology favored, since his wife has now come down with a similar pattern.  Ongoing smoking ande lack of finances to maintain meds are obvious complicating factors. We are doing what we can with neb and depo here, antibiotics and samples.

## 2011-04-08 ENCOUNTER — Encounter: Payer: Self-pay | Admitting: Cardiology

## 2011-04-09 NOTE — Assessment & Plan Note (Signed)
[name]                               LIPID CLINIC NOTE   NAME:Ernest [name], Ernest [name]                 MRN:          [id number]  DATE:11/07/2008                            DOB:          October 26, 1956    Ernest [name] is seen in Lipid Clinic for further evaluation and  medication titration associated with his multiple medication  intolerances associated with his lipid management.  He states he started  Plavix 3 months ago after a leg stent was placed.  He has not been  taking his fish oil and fenofibrate per his report.  He has a history of  smoking, though he has recently stopped with Chantix that will run  through the end of this month.  He has his bottles in regimen, but  sometimes forgets.  He denies having trouble obtaining his medications.  On dietary review, he uses eggs for breakfast each day, vegetable soup,  sandwiches, spaghetti, meat loaf, fried chicken, or meat.  He states he  feels sluggish overall, but he walks as much as he can 2 times a week  for 30 minutes each time.  He is eating some better because his wife is  out of work and has more time to [employer].   PAST MEDICAL HISTORY:  Pertinent for documented coronary disease.   Current lipid-lowering medicines include  1. Simvastatin 80 mg daily at bedtime.  2. Fenofibrate, which he is not taking.  3. Fish oil that he is not taking.   PHYSICAL EXAMINATION:  Weight today is 205 pounds, increased because of  his recent discontinuation of smoking; blood pressure is 122/66 and  heart rate is 66.   LABORATORY DATA:  On November 03, 2008, revealed total cholesterol 159,  triglycerides 229, HDL 39, and LDL 84.  His LFTs are within normal  limits.   ASSESSMENT:  The patient is feeling sluggish, possibly due to the  patient associates with Chantix.  The patient states he started feeling  well after an increase in his simvastatin 80 mg daily.  He also stopped  taking fenofibrate regularly and  fish oil regularly.  His wife is  interested in dietary counseling.   PLAN:  The patient will continue with same management, work to increase  his adherence.  He will try to line up his bottles with the list to  increase in adherence and consider fenofibrate and fish oil as more  regular medications.  He will work to decrease his eggs to 3 times a  week.  He will decrease his fried foods, cholesterol and dietary  counseling.  Reviews have been completed with the patient and follow  these.  The patient will work to increase his walking to 3 times each  week.  He will follow up in 3 months.  His CK today that was run was  normal.  His LFTs were  normal and will call with questions or problems at any time.  We will  see the patient back in 3 months.      Shelby Dubin, PharmD, BCPS, CPP  Electronically Signed      Rollene Rotunda,  MD, [facility]  Electronically Signed   MP/MedQ  DD: 12/23/2008  DT: 12/23/2008  Job #: [id number]

## 2011-04-09 NOTE — Assessment & Plan Note (Signed)
[facility]                               LIPID CLINIC NOTE   NAME:Ernest [name], Ernest [name]                 MRN:          [id number]  DATE:02/20/2009                            DOB:          10-05-1956    The patient is seen in the Lipid Clinic for further evaluation and  medication titration associated with his hyperlipidemia in the setting  of documented coronary disease and peripheral vascular disease.  The  patient has been feeling and doing well overall.  He did resume smoking  after his neck surgery, but he has set his next quit day for Friday  April 9.  He has had no muscle aches, pains, weakness, fatigue, or other  problems with his current combination therapy of Omega-3, fatty acids 1  g daily, Zocor 80 mg daily, and fenofibrate 200 mg daily.  The patient  has had no darkening of urine or side pain.  His exercise has been  limited by his neck pain.  He has been doing well overall.   PAST MEDICAL HISTORY:  As noted above.   CURRENT MEDICATIONS:  1. Norvasc 10 mg daily.  2. Prozac 40 mg daily.  3. Omega-3.  4. Fatty acids 1000 mg daily.  5. Enteric-coated aspirin 81 mg daily.  6. Fenofibrate 200 mg daily.  7. Zocor 80 mg daily.  8. Morphine 60 mg twice daily.  9. Carvedilol 25 mg twice daily.  10.Remeron 45 mg daily at bedtime.  11.Ambien 10 mg daily at bedtime.  12.Chantix as directed.   REVIEW OF SYSTEMS:  As stated in the HPI, otherwise negative.   The patient has no known drug allergies.   PHYSICAL EXAMINATION:  VITAL SIGNS:  Today, weight is 208 pounds, blood  pressure is 128/78, respirations 17 and comfortable.  This patient's  heart rate is 72.   LABORATORY DATA:  Labs, on this pleasant patient reveal LDL cholesterol  less than 90, HDL and triglycerides improved over last visit.   ASSESSMENT:  The patient is on combination of his lowering therapy with  simvastatin 80 mg daily and fenofibrate 200 mg daily.  However, with the  recent FDA advisory, we have discussed with the patient and Ms.  Friis that we cannot in comfort continue this therapy.  We will  have to reduce and he agrees to reduce his simvastatin to 40 mg.  The  patient has questions regarding continued qualification for Medicaid  We  talked about the different formulary changes that Medicaid has required  in the past year, and that I have assured him that I will work with him  on whether or not he ultimately is on Medicaid or on [employer] for  formulary decision.  He will call with questions or problems in the  meantime.  Again, he understands to reduce his Zocor, his simvastatin to  40 mg daily for now.  He will call with questions or problems, and let  me know formulary decision change as to whether for Medicaid we need to  go with Trilipix instead of fenofibrate or another statin.  The patient  will follow up in  3 months.  We will call him after his quit date on the  ninth to see how he is doing, and he will call with questions or  problems in the meantime.      Shelby Dubin, PharmD, BCPS, CPP  Electronically Signed      Rollene Rotunda, MD, [facility]  Electronically Signed   MP/MedQ  DD: 02/21/2009  DT: 02/21/2009  Job #: [id number]   cc:   Rollene Rotunda, MD, [facility]

## 2011-04-09 NOTE — Assessment & Plan Note (Signed)
[facility]                            CARDIOLOGY OFFICE NOTE   NAME:[name] [name] BISSONNETTE                 MRN:          [id number]  DATE:09/05/2008                            DOB:          [birth year]    HISTORY OF PRESENT ILLNESS:  [name] [name] is a pleasant 55 year old  Caucasian male with a past medical history significant for coronary  artery disease, hyperlipidemia, hypertension, tobacco abuse, and known  peripheral vascular disease who was initially seen in the peripheral  vascular clinic on July 04, 2008, with complaints of bilateral lower  extremity pain.  At that time, he complained of pain in both of his  calves after walking 30-40 yards.  This had been occurring for several  years.  A noninvasive study performed in October 2007 showed an ABI of  0.83 in the right leg and 0.99 in the left leg.  Arterial Doppler  imaging at that time showed a severe mid right superficial femoral  artery stenosis over the mid thigh.  There was moderate-to-severe  disease in the left common femoral artery and at the ostium of the  superficial femoral artery.  ABIs performed in September 2008 showed the  right ABI to be 0.9 and the left ABI to be 0.89.  The patient was  started on Pletal and sent to my office.  After talking to him, I felt  like the most appropriate next step would be to perform a lower  extremity runoff.  This was performed by Dr. Tonny Bollman on July 20, 2008.  The abdominal angiogram with lower extremity runoff showed a  75% stenosis in the left external iliac artery.  The common femoral  artery on the left had a 30-40% calcified stenosis.  There was plaque  disease noted in the left superficial femoral artery with three-vessel  runoff to the foot with no significant stenosis.  The right common  external and internal iliac arteries were patent.  There was plaque  throughout the iliac system, but no significant stenosis.  The common  femoral artery had a 30-40% stenosis at the site of the catheter entry.  The superficial femoral artery as well as the three vessels below the  knee were all widely patent.  Dr. Excell Seltzer elected to perform an  intervention with placement of a stent in the left external iliac  artery.  The patient was started on Plavix at that time and is here to  follow up today.  The patient has had some improvement in the pain in  his left lower extremity with ambulation.  He can now walk 75-100 yards  without any lower extremity pain, but after walking that far he begins  to have bilateral calf pain.  His symptoms over the last several years  have included bilateral lower extremity pain.  He also complains a day  of mild lower extremity edema.  He tells me that he stopped smoking 3  weeks ago, and he plans to continue not using tobacco.  He has been  taking all of his medications, but has not taken Plavix as prescribed  since he ran out of  it several weeks ago.   His past medical history is unchanged and includes coronary artery  disease with diffuse nonobstructive plaque with a normal ejection  fraction, prior history of heavy tobacco abuse, hypertension,  hyperlipidemia, depression, anxiety, and the peripheral vascular disease  as stated above.  The patient also has a history of chronic back pain  status post back surgery, left hip surgery, left arm surgery, and right  shoulder surgery.   ALLERGIES:  No known drug allergies.   CURRENT MEDICATIONS:  1. Norvasc 10 mg once daily.  2. Prozac 40 mg once daily.  3. Foradil 12 mcg twice daily.  4. Omega-3 daily.  5. Aspirin 81 mg once daily.  6. Fenofibrate 200 mg once daily.  7. Zocor 80 mg once daily.  8. Pletal 50 mg twice daily.  9. Morphine 60 mg twice daily.  10.Plavix 75 mg once daily, but stopped taking this 2 weeks ago.   PHYSICAL EXAMINATION:  VITAL SIGNS:  Blood pressure 138/80, pulse 92 and  regular, respirations 12 and nonlabored.    FOCUSED PERIPHERAL VASCULAR EXAMINATION:  1. The right lower extremity has no evidence of edema.  The dorsalis      pedis and posterior tibial pulses are 2+.  The right femoral artery      pulse is 1+.  I am unable to palpate the popliteal pulse.  There      are no ulcerations, varicosities, or color changes of the right      lower extremity.  Sensation is intact.  2. The left lower extremity has trace edema.  The dorsalis pedis and      posterior tibial pulses are 2+.  I am unable to palpate the      popliteal pulse.  The left femoral artery pulse is 1+.  There are      no ulcerations, varicosities, or color changes of the left lower      extremities.  Sensation is intact widely over the left lower      extremity.   DIAGNOSTIC STUDIES:  1. Abdominal angiogram with lower extremity runoff is described in      detail above and was performed on July 20, 2008, by Dr. Tonny Bollman.  2. Noninvasive arterial Doppler studies performed on September 02, 2008,      shows an ABI of 0.81 on the right and 0.86 on the left.  3. Duplex imaging of the abdominal aorta and lower extremities      performed on September 05, 2008, shows the abdominal aorta and common      iliac arteries are small in caliber and are not dilated.  There is      turbulence noted in the distal aorta, proximal common iliac      arteries, left external iliac artery, and common femoral arteries.      The stent placed in the left external iliac artery is patent.      Velocities are elevated in the common iliac and proximal left      external iliac arteries and mildly elevated in the right common      femoral artery.  Velocities are severely elevated in the left      common femoral artery.   ASSESSMENT AND PLAN:  This is a pleasant 55 year old Caucasian male with  a history of known coronary artery disease, hypertension,  hyperlipidemia, tobacco abuse, and known peripheral vascular disease  status post recent placement of a  stent in the  left external iliac  artery who presents today for routine followup.  The patient has had  some improvement in his lower extremity pain on the left since the  stenting procedure.  Overall, he still has bilateral calf pain after  ambulating more than 75 yards.  This pain does resolve with rest.  He  only took his Plavix for 1 month following the placement of the stent.  As noted above, he also recently stopped smoking 3 weeks ago.  I do not  think any further intervention procedures are necessary at this time.  The patient does have bilateral common femoral artery stenoses that are  between 30 and 40%.  He has good pulses in his lower extremities.  I  would like to continue him on a baby aspirin as well as the Plavix.  I  will have him stop his Pletal today.  I have encouraged him to continue  with his tobacco cessation.  I would like to see him back in the  Peripheral Vascular Clinic at the end of December, at which time we will  review his medications and make changes  that are appropriate at that time.  He will continue to follow with Dr.  Antoine Poche for his coronary artery disease.     Verne Carrow, MD  Electronically Signed    CM/MedQ  DD: 09/05/2008  DT: 09/06/2008  Job #: [id number]   cc:   Madolyn Frieze. Jens Som, MD, [facility]

## 2011-04-09 NOTE — Procedures (Signed)
NAME:  Ernest [name], TREICHLER NO.:  [id number]   MEDICAL RECORD NO.:  [id number]          PATIENT TYPE:  OUT   LOCATION:  SLEEP CENTER                 FACILITY:  [facility]   PHYSICIAN:  Barbaraann Share, MD,FCCPDATE OF BIRTH:  Jul 08, 1955   DATE OF STUDY:  09/22/2007                            NOCTURNAL POLYSOMNOGRAM   REFERRING PHYSICIAN:  Barbette Hair. Artist Pais, DO   LOCATION:  Sleep lab.   INDICATION FOR STUDY:  Hypersomnia with sleep apnea.   EPWORTH SLEEPINESS SCORE:  1   SLEEP ARCHITECTURE:  The patient had a total sleep time of 271 minutes  with no slow-wave sleep and only 31 minutes of REM.  Sleep onset latency  was normal and REM onset was quite prolonged at 339 minutes.  Sleep  efficiency was decreased at 71%.   RESPIRATORY DATA:  The patient was found to have 10 obstructive  hypopneas and 3 obstructive apneas for an apnea-hypopnea index of 3  events per hour.  The events were not positional.  There was moderate  snoring noted throughout.   OXYGEN DATA:  There were O2 desaturations as low as 89% with the  patient's obstructive events.   CARDIAC DATA:  No clinically significant cardiac arrhythmias were noted.   MOVEMENT-PARASOMNIA:  No significant leg jerks were noted   IMPRESSIONS-RECOMMENDATIONS:  Small numbers of obstructive events which  do not meet the apnea-hypopnea index criteria for the obstructive sleep  apnea syndrome.  It should be noted, however, the patient did have very  little slow-wave sleep and REM and therefore his degree of sleep apnea  could be underestimated.      Barbaraann Share, MD,FCCP  Diplomate, American Board of Sleep  Medicine  Electronically Signed     [facility]/MEDQ  D:  10/06/2007 14:47:45  T:  10/07/2007 09:08:06  Job:  [id number]

## 2011-04-09 NOTE — Assessment & Plan Note (Signed)
[facility]                            CARDIOLOGY OFFICE NOTE   NAME:Ernest [name], Ernest [name]                 MRN:          [id number]  DATE:03/31/2008                            DOB:          18-Oct-1956    PRIMARY:  Dr. Artist Pais.   REASON FOR PRESENTATION:  Evaluate patient with coronary disease and  chest pain.   HISTORY OF PRESENT ILLNESS:  The patient is a 55 year old gentleman with  a history of coronary disease as described below.  I have not seen him  since 2007.  He has been followed closely by Dr. Artist Pais.  He has seen Dr.  Jetty Duhamel routinely.  He continues to smoke cigarettes, though he is  down to a pack a day from three packs a day.  He has been treated for  chronic obstructive pulmonary disease.  Most recently from a  cardiovascular standpoint, he was seen by Dr. Samule Ohm.  He was managed  for peripheral vascular disease.  He still gets leg pain when he walks a  short distance.   The patient's most recent complaint has been chest discomfort.  This has  been happening over the past several months.  He will get chest  discomfort with activity.  He sometimes has this at rest.  He has taken  3-4 nitroglycerin over the last few years with the last one about 2-3  months ago.  He says the discomfort is substernal.  He does not think  that it radiates.  It goes away over several minutes.  There is no  associated nausea, vomiting or diaphoresis.  He does have progressive  dyspnea.  This is now worse over the last few months with activities.   PAST MEDICAL HISTORY:  Coronary artery disease (left main 30% stenosis,  LAD 20-30% stenosis, first and second diagonal branches had 40-50%  stenosis with small arteries, circumflex had 30% stenosis and a large  obtuse marginal, the right coronary artery had 70-80% stenosis.  This  was not felt to be obstructive after it was evaluated with a flow wire.  He had distal 50-60% disease.  The patient did have a stress  perfusion  study following this last catheterization in 2007, that demonstrated an  EF of 64% with no evidence of ischemia or scar), longstanding tobacco  abuse, dyslipidemia, hypertension, depression, anxiety, peripheral  vascular disease (ABI 0.9 on the right and 0.89 on the left), chronic  back pain, left hip surgery, back surgery, left arm surgery, right  shoulder surgery.   ALLERGIES:  NONE.   MEDICATIONS:  1. Norvasc 10 mg daily.  2. Simvastatin 40 mg daily.  3. Prozac 40 mg daily.  4. Ambien 10 mg daily.  5. Coreg 12.5 mg b.i.d.  6. Micardis 80 mg daily.  7. _________ 12 mcg b.i.d.  8. Omega-3.  9. Aspirin 81 mg daily.   REVIEW OF SYSTEMS:  As stated in the HPI and otherwise negative for  other systems.   PHYSICAL EXAMINATION:  GENERAL:  The patient is in no acute distress.  VITAL SIGNS:  Blood pressure 151/76, heart rate 68 and regular, weight  186 pounds,  body mass index 27.  HEENT:  Eyes unremarkable.  Pupils equal, round and reactive to light.  Fundi not visualized.  Oral mucosa unremarkable.  NECK:  No jugulovenous distention at 45 degrees.  Carotid upstroke brisk  and symmetric.  No bruits, thyromegaly.  LYMPHATICS:  No cervical, axillary or inguinal adenopathy.  LUNGS:  Clear to auscultation bilaterally.  BACK:  No costovertebral angle tenderness.  CHEST:  Unremarkable.  HEART:  PMI not displaced or sustained.  S1-S2 within normal limits.  No  S3, no S4, no clicks, no rubs, no murmurs.  ABDOMEN:  Obese, positive bowel sounds.  Normal in frequency and pitch.  No bruits, no rebound, no guarding, no midline hepatomegaly, no  splenomegaly.  SKIN:  No rashes, no nodules.  EXTREMITIES:  2+ pulses upper.  Absent dorsalis pedis and post tibialis  bilaterally.  No cyanosis, clubbing or edema.  NEURO:  Oriented to person, place and time.  Cranial nerves II-XII  grossly intact.  Motor grossly intact throughout.   EKG:  Sinus rhythm, rate 64, axis within normal limits,  intervals within  normal limits, no acute ST-T wave changes.   ASSESSMENT/PLAN:  1. Chest; the patient's chest comfort has some symptoms concerning for      angina.  He has coronary disease as described.  He needs to be      screened with a stress test.  He would not be able to exercise, so      he will have an adenosine perfusion study.  2. Hypertension; blood pressure is not well-controlled.  I will go      ahead and add hydrochlorothiazide 12.5 mg daily.  He knows to      increase his potassium containing foods.  He will remain on the      other medicines as listed.  3. Dyslipidemia; when he comes back for his stress test, I will check      a fasting lipid profile.  4. Tobacco; we talked about the need to stop smoking and is trying to      cut down, but is unable to quit at this point.  He understands the      importance of this.  5. Follow-up; I will see him back based on the results of the above      and no later than 1 month.     Rollene Rotunda, MD, [facility]  Electronically Signed    JH/MedQ  DD: 03/31/2008  DT: 03/31/2008  Job #: [id number]   cc:   Barbette Hair. Artist Pais, DO

## 2011-04-09 NOTE — H&P (Signed)
NAME:  ETAI, COPADO NO.:  [id number]   MEDICAL RECORD NO.:  [id number]          PATIENT TYPE:  INP   LOCATION:  3003                         FACILITY:  [facility]   PHYSICIAN:  Barbette Hair. Artist Pais, DO      DATE OF BIRTH:  July 16, 1956   DATE OF ADMISSION:  03/05/2007  DATE OF DISCHARGE:  03/10/2007                              HISTORY & PHYSICAL   CHIEF COMPLAINT:  Diarrhea and abdominal pain.   HISTORY OF PRESENT ILLNESS:  The patient is a 55 year old white male  with history of COPD and nonobstructive coronary artery disease, who  presents with 5 days of diarrhea.  The patient notes 10 loose bowel  movements a day in a small amount that have the appearance of coffee  grounds.  He is noted to have a history of diverticulitis in the past,  and underwent elective sigmoidectomy with Dr. Orson Slick in 2003.  He was  most recently seen on June 30 at the [facility] office and was started on  Cipro and Flagyl at that time.  He states that his symptoms have not  significantly improved with taking antibiotics.   He was recently hospitalized in April 2008 for lumbar spondylosis with  herniated nucleus pulposus  L4-L5.  He underwent transforaminal  decompression of  L4 and L5 nerve roots.  Also, underwent transforaminal  fusion and a allograft with pedicle screw fixation of L4-L5.  He has  been on Percocet and Vicodin since that time.  He has tapered within the  last 2 weeks' time.   During his hospitalization, they noted lipemia, and his triglyceride  level during hospitalization was in the 600-700s.  Thyroid studies and a  hemoglobin A1c were normal.  The patient had very poor high fatty diet.  He recently started Tricor.   PAST MEDICAL HISTORY SUMMARY:  1. COPD with tobacco use.  2. Hypertension.  3. Dyslipidemia.  4. History of alcohol abuse.  5. Gout.  6. Gastroesophageal reflux disease.  7. Status post sigmoidectomy for diverticulosis.  8. Status post spinal fusion L4-L5, April  2008.  9. Anxiety/depression.   CURRENT MEDICATIONS:  1. Metoprolol 50 mg b.i.d.  2. Amlodipine 10 mg once a day.  3. Foradil metered dose inhaler 1 puff b.i.d.  4. Pletal 100 mg once a day.  5. Prozac 40 mg once a day.  6. Coreg 12.5 mg b.i.d.  7. Simvastatin 40 mg q.h.s.  8. Micardis 80 mg once a day.  9. Tricor 145 mg once a day.   ALLERGIES TO MEDICATIONS:  Include:  1. CODEINE.  2. ASPIRIN.   HABITS/SOCIAL HISTORY:  Currently at one-half pack per day.  Denies any  alcoholic beverages.  He is currently disabled.   FAMILY HISTORY:  Noncontributory.   REVIEW OF SYSTEMS:  The patient complains of lightheadedness, chills and  a frontal headache with anorexia.  All other systems negative.   PHYSICAL EXAMINATION:  VITAL SIGNS:  Height is 5 foot 7, weight is 190  pounds, temperature is 97.4, pulse 83, BP is 136/81 left arm in a seated  position.  GENERAL:  The patient is a pleasant,  overweight, 55 year old white male  who appears older than stated age.  HEENT:  Normocephalic, atraumatic.  Pupils are equal and reactive to  light bilaterally.  Extraocular muscles intact.  The patient was  anicteric.  The patient had pink conjunctivae.  Oral pharyngeal exam was  unremarkable.  NECK:  Supple.  CHEST:  Normal respiratory effort, clear to auscultation bilaterally.  No rhonchi, rales or wheezing.  CARDIOVASCULAR:  Regular rate and rhythm.  No significant murmur, rubs  or gallops appreciated.  ABDOMEN:  Protuberant.  The patient had left lower quadrant tenderness  and mild right lower quadrant tenderness that was negative Murphy's sign  and no epigastric discomfort with deep palpation.  MUSCULOSKELETAL:  No clubbing, cyanosis or edema.  SKIN:  Warm and dry.  NEURO:  Cranial nerves II-XII were grossly intact.  He was nonfocal.  RECTAL:  A digital rectal exam was performed, which was heme negative,  showed light brown stools.   IMPRESSION:  1. Left lower quadrant pain and  diarrhea.  2. Hypertension.  3. Chronic obstructive pulmonary disease with ongoing tobacco abuse.  4. Gastroesophageal reflux disease.  5. Dyslipidemia.  6. Anxiety/depression.   RECOMMENDATIONS:  The patient has failed outpatient treatment.  I  suspect the patient's abdominal pain is secondary to possible  diverticulitis.   With history of recent elevated triglyceride level, check amylase and  lipase and LFTs.   Check stools for C. diff.   We will obtain CT of the abdomen and pelvis.      Barbette Hair. Artist Pais, DO  Electronically Signed     RDY/MEDQ  D:  06/04/2007  T:  06/04/2007  Job:  [id number]

## 2011-04-09 NOTE — Op Note (Signed)
NAME:  Ernest [name] NO.:  [id number]   MEDICAL RECORD NO.:  [id number]          PATIENT TYPE:  OBV   LOCATION:  2899                         FACILITY:  [facility]   PHYSICIAN:  Veverly Fells. Excell Seltzer, MD  DATE OF BIRTH:  03/03/1956   DATE OF PROCEDURE:  07/20/2008  DATE OF DISCHARGE:                               OPERATIVE REPORT   PROCEDURE:  Abdominal aortogram, left common iliac angiogram, left  common femoral catheter placement for pressure pullback, left external  iliac stenting.   INDICATION:  Ernest [name] is a 55 year old gentleman with classic  claudication symptoms.  He has bilateral leg symptoms with left-sided  symptoms being the most limiting.  He has undergone noninvasive studies  that demonstrated mildly reduced resting ABIs just below the range of  0.9.  He was referred for angiography.   Risks and indication of the procedure were reviewed with the patient.  Informed consent was obtained.  The right groin was prepped, draped, and  anesthetized with 1% lidocaine using modified Seldinger technique.  A 5-  French sheath was placed in the right femoral artery.  Pigtail catheter  was inserted into the suprarenal abdominal aorta.  Abdominal aortography  was performed using digital subtraction.  Pigtail catheter was brought  down to the aortic bifurcation and aortoiliac imaging was done also  under digital subtraction.  A runoff was performed using the bolus-chase  technique.  Angulated views over the iliac arteries were performed.  There was some artifact from hardware in the lower back from previous  surgery.  The right iliac system had no significant stenosis.  The left  external iliac had a moderate stenosis.  I elected to interrogate that  with pressure pullback.  A crossover catheter was used to access the  common iliac artery.  Wholey wire was inserted into the common femoral  artery and an end-hole catheter was placed into the distal common  femoral  artery.  A pullback was performed and there was a marked  pressure gradient of at least 60 mm across the external iliac.  Because  of the significant pressure gradient, I elected to intervene on this  vessel.  5000 units of heparin was given.  A Terumo sheath was inserted  across the aortoiliac bifurcation into the common iliac artery.  Common  iliac angiography was performed.  Marker tape was used to measure the  lesion.  I elected to stent the lesion with 9 x 40 mm EV3 Protege stent.  The stent was postdilated with a 7 x 40 FoxCross balloon which was taken  to 8 atmospheres for 30 seconds.  Following balloon dilatation,  angiography was performed and that showed what appeared to be a non-flow  limiting distal edge dissection.  There was also a minor dissection  plane across the proximal edge of the stent.  I interrogated this again  with end-hole catheter.  There was a 20-30 mm gradient just off the  distal edge of the stent.  Low pressure balloon inflation was then done.  The same 7 x 40 mm FoxCross balloon was advanced off the distal edge of  the stent  and a 4 atmospheres inflation was performed for 2 minutes.  The proximal edge of the stent was also dilated to 4 atmospheres for 60  seconds because of the mild dissection in that area.  The end-hole  catheter was then passed back down over the [facility] wire and there was  minimal residual pressure gradient remaining.  Final angiography  demonstrated an excellent angiographic result with no significant  residual dissection.   FINDINGS:  Abdominal aorta is widely patent.  The renal arteries are  widely patent bilaterally.  On the right side, the common external and  internal iliac arteries are all patent.  There is mild plaque throughout  the iliac system, but no significant stenoses are noted.  The common  femoral artery has 30-40% stenosis at the site of catheter entry.  The  profunda is widely patent.  The superficial femoral artery is  widely  patent with minimal irregularities.  The popliteal is widely patent.  The infragenicular vessels are widely patent with three-vessel runoff to  the foot.   On the left, the common femoral is widely patent.  The internal iliac is  patent.  The external iliac has diffuse 75% stenosis just to the  junction of the common femoral artery.  The common femoral has 30-40%  calcific stenosis.  The deep femoral and superficial femoral arteries  are patent with mild plaque in the superficial femoral artery.  There is  three-vessel runoff to the foot with no significant stenoses.   ASSESSMENT:  1. Severe left external iliac stenosis as documented with a large      resting pressure gradient.  2. Successful stenting of the left external iliac artery with a self-      expanding stent.   PLAN:  Ernest [name] will be treated with aspirin and Plavix.  He will  follow up with Dr. Clifton James in approximately 2 weeks and we will arrange  for followup ABIs.      Veverly Fells. Excell Seltzer, MD  Electronically Signed     MDC/MEDQ  D:  07/20/2008  T:  07/21/2008  Job:  [id number]   cc:   Rollene Rotunda, MD, [facility]  Verne Carrow, MD  Barbette Hair. Artist Pais, DO

## 2011-04-09 NOTE — Op Note (Signed)
NAME:  DUAYNE, BRIDEAU NO.:  [id number]   MEDICAL RECORD NO.:  [id number]          PATIENT TYPE:  OIB   LOCATION:  3535                         FACILITY:  [facility]   PHYSICIAN:  Stefani Dama, M.D.  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   PROCEDURES:  Anterior cervical decompression C5-6, C6-7; arthrodesis  with structural allograft and Alphatec plate fixation.   SURGEON:  Stefani Dama, MD   FIRST ASSISTANT:  Clydene Fake, MD   ANESTHESIA:  General endotracheal.   INDICATIONS:  Raj Landress is a 55 year old individual who has had  significant spondylitic disease in the lumbar spine and more recently in  the cervical spine with neck pain, shoulder and arm pain, weakness,  tingling, and numbness in the left upper extremity.  He has advanced  spondylitic changes with chronically herniated disk at C5-6 on the left  side and C6-7 centrally into the right side.  It has been advised  regarding surgical decompression and arthrodesis.   PROCEDURE:  The patient was brought to the operating room in supine on  the stretcher.  After smooth induction of general endotracheal  anesthesia, his head was placed in 5 pounds of halter traction.  Neck  was prepped with alcohol and DuraPrep and draped in a sterile fashion.  Transverse incision was made in the left side of the neck and carried  down through the platysma.  The plane between the sternocleidomastoid  and strap muscle was dissected bluntly until the prevertebral space was  reached.  The first identifiable disk space was noted to be that of C5-  C6.  Some ventral osteophytosis was taken down with a Leksell rongeur.  The disk space was then  opened with a #15 blade and a combination of  curettes and rongeurs was used to evacuate significantly degenerated  disk material.  As the region of the posterior longitudinal ligament was  reached on the left side, there was noted to be some subligamentously  herniated disk material.  This was removed and the ligament itself was  noted to be thickened and redundant in this area.  This was taken down  carefully with initially a 1 mm and then a 2-mm Kerrison punch and this  allowed for decompression of the common dural tube and the takeoff of  the C6 nerve root.  Some osteophytosis that had grown up over this area  was drilled down with a 2.3-mm dissecting tool and a high-speed bur.  This allowed further decompression of the exit foramen.  Similar  procedure was carried out onto the right side.  Here, there was much  smaller osteophytic ridge which was drilled down similarly.  Hemostasis  was achieved in the disk space.  The endplates were ground smooth and  then a 7-mm transgraft was shaped and formed to the appropriate size and  configuration to fit within the  disk space and then this was tamped into  the interspace and countersunk slightly.  Attention was then turned to  C6-C7 where similar procedure was carried out.  Here, there was noted to  be central osteophytosis which was carefully drilled down to decompress  the central dura.  Foraminotomies were created out over the lateral  recesses to decompress the C7 nerve roots.  In the end, the 7-mm graft  was fashioned in a similar fashion, filled with demineralized bone  matrix and placed into the interspace.  Traction was removed at this  point and then a 34-mm standard size Alphatec Trestle plate was fixed to  the ventral aspect of the vertebral bodies with 6 variable angle 14-mm  screws.  These were secured.  Hemostasis was achieved in the soft  tissues in the prevertebral space and then the retractor was carefully  removed.  The area  was inspected for hemostasis.  Final localizing  radiograph identified the top portion of plate in good position with  graft appropriately recessed.  The platysma was then closed with 3-0  Vicryl interrupted fashion, 3-0 Vicryl was used in the subcuticular  tissues, and Dermabond was placed in the skin.  Blood loss was estimated  less than 50 mL.      Stefani Dama, M.D.  Electronically Signed     HJE/MEDQ  D:  01/05/2009  T:  01/05/2009  Job:  [id number]

## 2011-04-09 NOTE — Assessment & Plan Note (Signed)
[facility]                            CARDIOLOGY OFFICE NOTE   NAME:Ernest [name], Ernest [name]                 MRN:          [id number]  DATE:06/27/2008                            DOB:          [birth year]    PRIMARY CARE PHYSICIAN:  Barbette Hair. Artist Pais, DO   REASON FOR PRESENTATION:  Evaluate the patient with coronary disease and  peripheral vascular disease.   HISTORY OF PRESENT ILLNESS:  The patient presents for followup of the  above.  He had been having some chest discomfort when I saw him in June,  but I managed him medically.  He said he had 1 episode of chest pain  about 3 weeks ago requiring 3 sublingual nitroglycerin.  Since that  time, he has not had any further discomfort.  He is limited in his  activities predominantly by leg pain.  He also has some chronic back  pain.  He said he does get pain in his calf muscles walking about 50  yards on level ground.  I started Pletal at the last visit, but this has  not improved his symptoms.  He has been describing some tingling in his  arms at rest at night.  He has also had some numbness on the left side  of his foot.  He is not having any new shortness of breath.  He is not  having any PND or orthopnea.  He is having no palpitation, presyncope,  or syncope.   PAST MEDICAL HISTORY:  Coronary artery disease (see May 19, 2008,  note), well-preserved ejection fraction, ongoing tobacco abuse (1 pack  per day), dyslipidemia, hypertension, depression/anxiety, peripheral  vascular disease (ABIs 0.9 on the right and 0.89 on the left), chronic  back pain, left hip surgery, back surgery, left arm surgery, and right  shoulder surgery.   ALLERGIES:  None.   MEDICATIONS:  1. Norvasc 10 mg daily.  2. Micardis 80 mg daily.  3. Foradil.  4. Omega-3.  5. Aspirin 81 mg daily.  6. MS Contin.  7. Hydrochlorothiazide 12.5 mg daily.  8. Simvastatin 80 mg daily.  9. Pletal 50 mg b.i.d.  10.Carvedilol 18.75 mg  b.i.d.  11.Morphine.   REVIEW OF SYSTEMS:  As stated in the HPI and otherwise negative for  other systems.   PHYSICAL EXAMINATION:  GENERAL:  The patient is in no acute distress.  VITAL SIGNS:  Blood pressure 152/89, heart rate 75 and regular, and  weight 190 pounds.  HEENT:  Eyes unremarkable; pupils equal, round, and reactive to light;  fundi not visualized; oral mucosa unremarkable.  NECK:  No jugular venous distention at 45 degrees; carotid upstroke  brisk and symmetric; no bruits, no thyromegaly.  LYMPHATICS:  No cervical, axillary, or inguinal adenopathy.  LUNGS:  Decreased breath sounds without wheezing or crackles.  BACK:  No costovertebral angle tenderness.  CHEST:  Unremarkable.  HEART:  PMI not displaced or sustained; S1 and S2 within normal limits;  no S3, no S4; no clicks, no rubs, no murmurs.  ABDOMEN:  Flat; positive bowel sounds, normal in frequency and pitch; no  bruits, no rebound, no guarding, no  midline pulsatile mass, no  hepatomegaly, no splenomegaly.  SKIN:  No rashes, no nodules, multiple surgical scars.  EXTREMITIES:  2+ upper pulses, 1+ left femoral, absent right femoral,  absent popliteals, absent dorsalis pedis and posterior tibialis  bilaterally; no cyanosis, no clubbing, no edema.  NEURO:  Oriented to person, place, and time; cranial nerves II through  XII grossly intact; motor grossly intact.   ASSESSMENT AND PLAN:  1. Coronary artery disease.  The patient had a low-risk stress      perfusion study in May.  He has had 1 episode of chest discomfort.      At this point, we are going to continue medical management and risk      reduction.  2. Tobacco.  I spent greater than 3 minutes counseling about the need      to stop smoking.  We decided not to use Chantix given his history      of depression.  He is going to try probably nicotine patches.  3. Hypertension.  Blood pressure is still not controlled.  I am going      to go up on the Coreg to 25 mg  twice a day.  4. Peripheral vascular disease.  Given his ongoing claudication and      failure to respond to medical therapy, I will send him to one of      our PV physicians for evaluation.  5. Dyslipidemia.  He is being followed in the [facility].  6. Followup.  I would like to see the patient back in about 3 months      or sooner if needed.     Rollene Rotunda, MD, [facility]  Electronically Signed    JH/MedQ  DD: 06/27/2008  DT: 06/27/2008  Job #: [id number]   cc:   Barbette Hair. Artist Pais, DO

## 2011-04-09 NOTE — Assessment & Plan Note (Signed)
[facility]                            CARDIOLOGY OFFICE NOTE   NAME:Ernest [name], Ernest [name]                 MRN:          [id number]  DATE:05/19/2008                            DOB:          1956/04/06    PRIMARY CARE PHYSICIAN:  Barbette Hair. Artist Pais, DO   REASON FOR PRESENTATION:  Evaluate the patient with nonobstructive  coronary disease and chest pain and ongoing risk factors.   HISTORY OF PRESENT ILLNESS:  The patient returns for followup of the  above.  At the last visit, he was describing some chest discomfort.  Therefore, in early May, I sent him for stress perfusion study which  demonstrated an ejection fraction of 62%.  There was some mild apical  thinning but no evidence of scar or ischemia.   The patient says he had one episode of chest discomfort which happened  to be yesterday.  This was while watching TV.  It was not similar to  previous chest pain.  He described a pressure.  It was dull.  It was  under his sternum.  He was nauseated.  He was diaphoretic.  It lasted  for about 1 minute and went away spontaneously.  It did not have  radiation to his arms or to his neck.  He did not throw up.  He did not  get short of breath.  He has had no PND or orthopnea.  He is limited in  his activities by leg pain but has not been bringing on any of these  symptoms.  Has not taken any nitroglycerin since the last visit.  He did  not think the symptoms were similar to previous symptoms and not like  previous reflux.   PAST MEDICAL HISTORY:  1. Coronary artery disease (left main 30% stenosis, LAD 20-30%      stenosis, first and second diagonal branches at 40-50% stenosis      with small arteries, circumflex had 30% stenosis in the large      obtuse marginal, right coronary artery at 70-80% stenosis.  There      was not felt to be occlusive after it was evaluated with a flow      wire.  He had distal 50-60% stenosis.  He has had stress perfusion  studies as described).  2. Well-preserved ejection fraction.  3. Longstanding and ongoing tobacco abuse (1 pack per day).  4. Dyslipidemia (now followed in [facility]id Clinic).  5. Hypertension.  6. Depression/anxiety.  7. Peripheral vascular disease (ABIs 0.9 on the right and 0.89 on the      left).  8. Chronic back pain.  9. Left hip surgery.  10.Back surgery.  11.Left arm surgery.  12.Right shoulder surgery.   ALLERGIES:  None.   MEDICATIONS:  1. Norvasc 10 mg daily.  2. Prozac 40 mg daily.  3. Ambien 10 mg daily.  4. Coreg 12.5 mg b.i.d.  5. Micardis 80 mg daily.  6. Foradil 12 mcg b.i.d.  7. Omega-3.  8. Aspirin 81 mg daily.  9. MS Contin.  10.Hydrochlorothiazide 12.5 mg daily.  11.Simvastatin 80 mg daily.   REVIEW OF SYSTEMS:  As stated in the HPI, otherwise negative for other  systems.   PHYSICAL EXAMINATION:  GENERAL:  The patient is in no distress.  VITAL SIGNS:  Blood pressure 154/86, heart 71 and regular, weight 187  pounds, body mass index 27.  HEENT:  Eyes are unremarkable.  Pupils are equal, round, and reactive to  light.  Fundi not visualized.  Oral mucosa unremarkable.  NECK:  No jugular venous distention at 45 degrees.  Carotid upstroke  brisk and symmetric.  No bruits, no thyromegaly.  LYMPHATICS:  No adenopathy.  LUNGS:  Clear to auscultation bilaterally.  BACK:  No costovertebral angle tenderness.  CHEST:  Unremarkable.  HEART:  PMI not displaced or sustained.  S1 and S2 are within normal  limits.  No S3, no S4, no clicks, no rubs, no murmurs.  ABDOMEN:  Obese, positive bowel sounds.  Normal in frequency and pitch.  No bruits, no rebound, no guarding, no midline pulsatile mass or  hepatomegaly, no splenomegaly.  SKIN:  No rashes, no nodules.  Multiple surgical scars.  EXTREMITIES:  Pulses 2+ upper, absent dorsalis pedis and posterior  tibialis bilaterally.  No cyanosis, no clubbing, no edema.  NEUROLOGIC:  Oriented to person, place, and time.  Cranial  nerves II through XII are  grossly intact.  Motor grossly intact.   EKG, sinus rhythm, rate 66, axis within normal limits, intervals within  normal limits.  No acute ST-wave change.   ASSESSMENT AND PLAN:  1. Chest:  The patient's chest discomfort is somewhat atypical but      also some worrisome.  He has had 1 episode only.  At this point, I      am going to continue the medications as listed as he had stress      perfusion study that was recently negative.  However, if he has any      further discomfort, he is instructed to go to the emergency room      and probably would do a catheterization at that point.  He knows to      take a sublingual nitroglycerin.  2. Tobacco.  He understands the need to quit smoking.  We have had      discussion again.  3. Dyslipidemia.  He is following up in [facility]id Clinic and he will      continue this.  4. Peripheral vascular disease.  I am going to go ahead and start      Pletal which helped in the past.  He simply stopped it because he      ran out of it.  He never had any problems with it.  He is going to      get ankle-brachial indices repeated.  He may need followup with Dr.      Excell Seltzer.  5. Hypertension.  Blood pressure is not well controlled.  I am going      to increase his Coreg to      18.75 mg twice a day.  6. Followup.  I will see him back in about 6 weeks or sooner if      needed.     Rollene Rotunda, MD, [facility]  Electronically Signed    JH/MedQ  DD: 05/19/2008  DT: 05/20/2008  Job #: [id number]   cc:   Barbette Hair. Artist Pais, DO

## 2011-04-09 NOTE — Assessment & Plan Note (Signed)
[facility]                             PULMONARY OFFICE NOTE   NAME:Ernest [name], Ernest [name]                 MRN:          [id number]  DATE:05/18/2007                            DOB:          01/18/1956    PROBLEM:  1. Chronic obstructive pulmonary disease.  2. Ongoing tobacco use.  3. Depression.  4. History of tussive syncope.  5. Insomnia.  6. Lung nodules.   HISTORY:  He is wearing a back brace after spinal fusion in April by Dr.  Danielle Dess. This does restrict his breathing a bit. He has nearly quit  cigarettes, down to about 3 to 5 cigarettes per day. Complains of  difficultly initiating sleep. He has been trying Remeron. We discussed  sleep hygiene. Coughing scant white sputum. No chest pain or acute  respiratory event. He is trying to avoid mid day heat this summer.   MEDICATIONS:  1. Metoprolol 50 mg b.i.d.  2. Norvasc 10 mg.  3. Benicar 40 mg.  4. Foradil 1 puff b.i.d.  5. Pulmicort 1 puff b.i.d.  6. Remeron 30 mg at bedtime.  7. Pletal 100 mg.  8. Prozac 40 mg.  9. Vicodin 5/500 q.4 to 6 hours p.r.n.   DRUG INTOLERANT:  CODEINE AND ASPIRIN.   OBJECTIVE:  Weight 200 pounds, blood pressure 102/64, pulse 85, room air  saturation 96%. Back brace as noted. Faint odor of tobacco. Breath  sounds are diminished, shallow without cough, wheeze, or rhonchi.  HEART SOUNDS: Regular without murmur heard.   IMPRESSION:  Chronic obstructive pulmonary disease with ongoing tobacco  use. Temporary additional restriction because of the pressure of his  back brace. Chest x-ray last November had been unremarkable. CT scan of  the chest a year ago, had questioned tiny nodules of uncertain  circumstance. Without progression evident by chest x-ray they could  probably ignored.   PLAN:  Smoking cessation, chest x-ray. We refilled Foradil and  Pulmicort. Temporary p.r.n. use of Ambien 10 mg with discussion of  appropriate sleep hygiene measures. Schedule  return 6 months, earlier  p.r.n.     Clinton D. Maple Hudson, MD, [facility], FACP  Electronically Signed    CDY/MedQ  DD: 05/21/2007  DT: 05/21/2007  Job #: [id number]

## 2011-04-09 NOTE — Assessment & Plan Note (Signed)
[facility]                               LIPID CLINIC NOTE   NAME:Dupee, MORRELL FLUKE                 MRN:          [id number]  DATE:07/14/2008                            DOB:          1955-12-22    Return office visit for Lipid Clinic.   PAST MEDICAL HISTORY:  Hyperlipidemia, coronary artery disease, chronic  back pain, tobacco use for 30 plus years, hypertension, depression,  anxiety, and peripheral vascular disease.   MEDICATIONS:  1. Coreg 25 mg twice daily.  2. Norvasc 10 mg daily.  3. Prozac 40 mg daily.  4. Micardis 80 mg daily.  5. Foradil 12 mcg twice daily.  6. Omega-3 fatty acid 1 g 3 times daily.  7. Aspirin 81 mg daily.  8. Fenofibrate 200 mg daily.  9. HCTZ 12.5 mg daily.  10.Simvastatin 80 mg daily.  11.Pletal 50 mg twice daily.  12.Morphine 60 mg twice daily.   PHYSICAL EXAMINATION:  VITAL SIGNS:  Weight 191 pounds, blood pressure  136/78, and heart rate of 82.   LABORATORY DATA:  Total cholesterol 142, triglyceride 184, HDL 39, LDL  66, and LFTs within normal limits.   ASSESSMENT:  Mr. Casteneda is a pleasant 55 year old gentleman who  returns to Lipid Clinic today with no chest pain, no shortness of  breath, no muscle aches or pains.  He states compliance with current  medication regimen and that would show with his lipid panel that is at  goal of total cholesterol less than goal of 200, triglycerides still  greater than goal of less than 150, however, significantly improved from  314 at last visit, HDL at goal of 40 or greater, and LDL at goal of less  than 70.  However, Mr. Closser is very open that he has not been  compliant with lifestyle modification.  He is not interested in smoking  cessation at the moment, but states he will continue to think about it.  He has not started any type of exercise regimen and is not willing to  begin so, given his back pain.  He has not made any significant changes  in his diet  and states that since his LDL is at goal, he is not willing  to make any changes.  He does eat scrambled eggs, bacon, and sausage  every morning for breakfast.  I have encouraged him to at least decrease  his scrambled eggs to 4 days a week and add in some type of high-fiber  cereal further 3 days a week.  He does seem willing to do that.  He eats  fried chicken for lunch a few days a week that his wife prepares and  meet loaf or some type of other homemade food for lunch and for his  evening meal.  They rarely go out to dinner.  His wife seems to cook a  lot for them, but does not seem to make any large strides in low fat or  low carbohydrates.  We will continue to encourage lifestyle modification  in this gentleman; however, he did state that if his numbers are good,  then  he is not going to make any changes in his lifestyle.   PLAN:  1. Continue current medications.  2. Continue smoking cessation.  3. Continue to encourage lifestyle modification.  4. Followup visit in 4 months for lipid panel and LFTs.  We will make      adjustments at that time.      Leota Sauers, PharmD  Electronically Signed      Jesse Sans. Daleen Squibb, MD, [facility]  Electronically Signed   LC/MedQ  DD: 07/14/2008  DT: 07/15/2008  Job #: [id number]

## 2011-04-09 NOTE — Assessment & Plan Note (Signed)
[facility]                               LIPID CLINIC NOTE   NAME:Ernest [name], Ernest [name]                 MRN:          [id number]  DATE:05/16/2008                            DOB:          1955-11-28    PRIMARY PHYSICIAN:  Barbette Hair. Artist Pais, DO   CARDIOLOGIST:  Rollene Rotunda, MD, [facility]   The patient is today in Lipid Clinic for further evaluation,  medication  titration associated with secondary prevention.   PAST MEDICAL HISTORY:  Pertinent for:  1. Coronary artery disease.  2. Tobacco use for 30 years.  3. Chronic back pain status post left hip surgery.  4. Hyperlipidemia.  5. Hypertension.  6. Depression.  7. Anxiety.  8. Back surgery.  9. Left arm surgery.  10.Right shoulder surgery.  11.PVD with an ABI of 0.9 on the right, 0.89 on the left.   CURRENT MEDICATIONS:  1. He has been taking simvastatin 40 mg daily for 2 years.  2. He has been on omega-3 fatty acids, 1000 mg 3 times daily for the      past 2 years.  3. He was TriCor 145, but has been off this medication for      approximately 1 year due to financial concerns.   DRUG ALLERGIES:  None are known.   SOCIAL HISTORY:  He had been smoking 3 packs per day for 30 years, and  is now down to 1-pack per day.  Occasionally, he has 1-2 beers at night.  Exercise is limited due to back and leg pain.  Breakfast often includes  eggs; lunch is sandwiches; dinner is fried chicken, meat leg, or  hamburger with occasional salads and they eat out a lot.   PHYSICAL EXAMINATION:  VITAL SIGNS:  Weight is 185 pounds.  Blood  pressure is 148/80 in the right arm.  Heart rate is 78.  Respirations  are 16.   LABORATORY DATA:  Labs on Apr 05, 2008 revealed total cholesterol of  283, triglyceride 314, HDL 29.7, LDL 129.9.  His liver functions were  normal.   ASSESSMENT:  The patient is a 55 year old male, he has been referred for  primary and secondary cardiovascular prevention due to risk factors of  coronary artery disease, peripheral vascular disease, tobacco,  hypertension, and hyperlipidemia, and continued tobacco use.  He has  expressed willingness to make lifestyle improvements with diet, and will  work to continue to decrease his tobacco requirement.  We will add the  patient back on fenofibrate 200 mg daily, as this is now available  generically, I am hesitant to use this with the simvastatin 80, but feel  that we must consider increasing with significant education to the  patient, which has been provided by Elenore Paddy, MD on potential for  muscle aches, pains, darkening of urine, and the patient understands, he  must call with these symptoms immediately.  He is going to have labs  rechecked in 6 weeks including lipid and liver.  He will be back in  clinic in 7 weeks, and he will work on his diet to make a moderate  improvements including  decreasing fatty foods and eating out less.      Shelby Dubin, PharmD, BCPS, CPP  Electronically Signed      Rollene Rotunda, MD, [facility]  Electronically Signed   MP/MedQ  DD: 05/18/2008  DT: 05/19/2008  Job #: [id number]   cc:   Rollene Rotunda, MD, [facility]

## 2011-04-09 NOTE — Progress Notes (Signed)
[facility]                        PERIPHERAL VASCULAR OFFICE NOTE   NAME:Sonier, ANUEL SITTER                 MRN:          [id number]  DATE:11/22/2008                            DOB:          October 27, 1956    PRIMARY CARDIOLOGIST:  Rollene Rotunda, MD, [facility]   HISTORY OF PRESENT ILLNESS:  Mr. Heckendorn is a pleasant 55 year old  Caucasian male with a past medical history significant for coronary  artery disease, tobacco abuse, hyperlipidemia, hypertension, and  peripheral vascular disease who presents today to our Peripheral  Vascular Clinic for routine followup.  I originally saw Mr. Steffensmeier  in the Peripheral Vascular Clinic on July 04, 2008, at which time, he  was complaining of bilateral lower extremity pain.  We elected to  perform a lower extremity runoff, which was performed by Dr. Tonny Bollman on July 20, 2008.  The abdominal angiogram with lower extremity  runoff showed a 75% stenosis in the left external iliac artery.  A stent  was placed in this area of tight stenosis.  Otherwise, the patient had  bilateral common femoral artery stenosis of 30-40% and only plaque  disease noted elsewhere with 3-vessel runoff in both lower extremities.  I last saw Mr. Kukuk in this office on September 05, 2008, for  hospital followup.  He comes in today and tells me that he is doing  well.  He is able to walk much further now than he was prior to his  peripheral vascular intervention.  He is able to walk over hundred yards  now with no pain in all of his lower extremities.  He is continued to  take his aspirin and Plavix since stent was placed in the left external  iliac artery.   In regards to his peripheral vascular disease, he seems to be less  symptomatic.  He does tell me that he has had several episodes over the  last few weeks of resting chest discomfort.  The first episode occurred  2 weeks ago and it felt as a squeezing in his neck and  chest.  This  lasted for 5 minutes and resolved with a sublingual nitroglycerin.  He  had another episode last week of chest tightness that resolved by  sitting up.  The patient tells me that he has been taking over-the-  counter proton pump inhibitors for gastroesophageal reflux disease.  He  also has disk disease in his neck and feels that much of this may be  referred pain from his neck.   PAST MEDICAL HISTORY:  Unchanged and is described in detail above.   CURRENT MEDICATIONS:  1. Norvasc 10 mg once daily.  2. Prozac 40 mg once daily.  3. Omega-3 fatty acids 1 g once daily.  4. Enteric-coated aspirin 81 mg once daily.  5. Fenofibrate 200 mg once daily.  6. Zocor 80 mg once daily.  7. Morphine 60 mg twice daily.  8. Plavix 75 mg once daily.  9. Coreg 25 mg twice daily.  10.Remeron 45 mg once daily.  11.Ambien 10 mg once at night.  12.Chantix as directed.   REVIEW OF SYSTEMS:  As described above  and is otherwise negative.   PHYSICAL EXAMINATION:  VITALS:  Blood pressure 143/78, pulse 78 and  regular, respirations 12 and nonlabored.  GENERAL:  He is a pleasant middle-aged Caucasian male, in no acute  distress.  He is alert and oriented x3.  SKIN:  Warm and dry.  PSYCHIATRIC:  Mood and affect is normal.  MUSCULOSKELETAL:  Muscle strength and tone is normal.  EXTREMITIES:  No evidence of lower extremity edema.  Pulses are 1+ in  the bilateral dorsalis pedis and posterior tibial arteries.  There is no  evidence of ulcerations or discoloration of the skin over the lower  extremities.  Sensation is intact over both lower extremities in both  feet.   DIAGNOSTIC STUDIES:  1. A 12-lead EKG obtained in our office today shows normal sinus      rhythm with a ventricular rate of 68 beats per minute.  There are      no ischemic changes noted.   ASSESSMENT AND PLAN:  This is a pleasant 55 year old Caucasian male with  a history of known coronary artery disease, peripheral vascular  disease,  hypertension, hyperlipidemia, and tobacco abuse who presents to the  Peripheral Vascular Clinic for routine followup.  Mr. Vest reports  much improvement in his lower extremity pain with ambulation since his  stenting procedure was performed.  I think, it would be reasonable at  this time to stop his Plavix therapy.  He is to continue taking an  aspirin once daily.  He has stopped smoking and is currently taking  Chantix.  I have encouraged him to continue daily exercise.  His wife is  with him today and we talked about the importance of walking.  The  patient and her wife have the treadmill and the patient will try to  start using this.  In regards to his chest pain, it does not sound like  typical cardiac pain.  However, the patient does have moderate  obstructive coronary artery disease by heart catheterization several  years ago.  I have instructed him to alert our office and to let Dr.  Antoine Poche know if he continues to have episodes of chest pain,  particularly those with exertion.  The patient is aware of the  importance of alerting [place] of any change in his clinical status.  I would  like to see him back in the Peripheral Vascular Clinic in 12 months at  which time, we will repeat lower extremity arterial Doppler studies.     Verne Carrow, MD  Electronically Signed    CM/MedQ  DD: 11/22/2008  DT: 11/23/2008  Job #: [id number]   cc:   Rollene Rotunda, MD, [facility]

## 2011-04-09 NOTE — Discharge Summary (Signed)
NAME:  Ernest [name], Ernest [name] NO.:  [id number]   MEDICAL RECORD NO.:  [id number]          PATIENT TYPE:  INP   LOCATION:  6734                         FACILITY:  [facility]   PHYSICIAN:  Willow Ora, MD           DATE OF BIRTH:  Dec 18, 1955   DATE OF ADMISSION:  06/04/2007  DATE OF DISCHARGE:  06/06/2007                               DISCHARGE SUMMARY   BRIEF HISTORY AND PHYSICAL:  Ernest [name] is a 55 year old gentleman,  admitted with several days' history of diarrhea and abdominal pain.  Please see the history and physical for details.   PHYSICAL EXAM:  Upon admission, he weighs 190 pounds, afebrile, pulse  83, blood pressure 136/81.  LUNGS: Clear to auscultation bilaterally.  CARDIOVASCULAR: Regular rate and rhythm without a murmur.  ABDOMEN:  Protuberant.  He had left lower quadrant tenderness and mild  right lower quadrant tenderness as well.  Negative Murphy's sign.  No  epigastric discomfort.  RECTAL EXAM:  Shows Hemoccult-negative stools.  The stools were light-  brown.   LABORATORY:  White count upon admission was 13.1 with a hemoglobin of  15.6 and platelets of 209, potassium 3.7, creatinine 0.9.  LFTs were  normal.  Amylase, lipase were normal.  Calcium was normal.  Stool  studies show a negative lactoferrin, negative C diff.  Giardia and  Cryptosporidium in his stools are pending.  Sed rate was 4.   HOSPITAL COURSE:  The patient was admitted to the hospital with several  days' history of diarrhea.  He was seen in the outpatient setting and  started on Cipro and Flagyl for presumed diverticulitis, but he did not  get better.  The diarrhea is several episodes a day in a small amount  with a coffee ground appearance.  He underwent CT of the abdomen and  pelvis which was basically normal.  He did have diverticulosis.  Since  the C diff was negative, we called GI and they agreed that  pseudomembranous colitis needed to be rule out.  He underwent a flex  sigmoidoscopy the day of discharge, and it was basically negative.  At  this point the patient's pain has decreased.  He still has diarrhea.  He  is tolerating p.o. without any problems.  I contacted GI, and since the  workup has been negative, we feel that it is okay to send the patient  home with a close follow-up by his primary care doctor.  He will be  discharged without antibiotics, given the fact that we have ruled out  diverticulitis or pseudomembranous colitis.   He will be discharged with the following instructions:  1. Take Metamucil 1 capsule 3 times a day.  2. Drink plenty of fluids.  3. Continue with the same medications that include metoprolol,      amlodipine, Foradil, Pletal,  Prozac, Coreg, simvastatin, Micardis,      and Tricor.  4. I advised the patient to call Dr. Artist Pais on the 14th, and request a      prompt office visit there for reevaluation.  He is also advised to  go to the ER if he gets worse, has high fever, more pain or more      diarrhea.   ADMITTING DIAGNOSIS:  Diverticulitis.   DISCHARGE DIAGNOSES:  1. Diarrhea.  Workup negative.  2. Abdominal pain. improving.  3. Hypertension. stable.  4. Chronic obstructive pulmonary disease is stable.  5. Dyslipidemia, stable.      Willow Ora, MD  Electronically Signed     JP/MEDQ  D:  06/06/2007  T:  06/08/2007  Job:  [id number]   cc:   Barbette Hair. Artist Pais, DO

## 2011-04-09 NOTE — Progress Notes (Signed)
[facility]                        PERIPHERAL VASCULAR OFFICE NOTE   NAME:Ernest [name], Ernest [name]                 MRN:          [id number]  DATE:07/04/2008                            DOB:          1956-01-29    REASON FOR VISIT:  Bilateral lower extremity pain, with abnormal  arterial Doppler studies in the past.   HISTORY OF PRESENT ILLNESS:  Ernest [name] is a pleasant 55 year old  Caucasian male with a past medical history significant for coronary  artery disease, hyperlipidemia, hypertension, tobacco abuse, and known  peripheral vascular disease without prior intervention who presents  today with complaints of bilateral lower extremity pain.  He was most  recently seen by Dr. Rollene Rotunda on June 27, 2008, and was evaluated  for his coronary artery disease.  He was sent to the Peripheral Vascular  Clinic today for further evaluation of symptomatic peripheral vascular  disease.  The patient states that for several years he has had pain in  both of his legs when he walks.  This has seemed to progress over the  last few years.  A noninvasive study in October 2007 showed an ABI of  0.83 on the right leg and 0.99 in the left leg.  Arterial Doppler  imaging at that time showed a severe mid right superficial femoral  artery stenosis over the mid thigh.  There was moderate-to-severe  disease in the left common femoral artery and ostium of the superficial  femoral artery.  He has most recently had ankle-brachial index performed  in September 2008 that was 0.9 in the right leg and 0.89 in the left  leg.  Dr. Antoine Poche started him on Pletal 50 mg twice daily 1 week ago  from today.  He has seen no improvement in his symptoms over the last  week.  He has no other complaints at the current time.   PAST MEDICAL HISTORY:  Significant for coronary artery disease with  diffuse nonobstructive disease and a normal ejection fraction, tobacco  abuse, hypertension,  hyperlipidemia, depression, anxiety, and peripheral  vascular disease.  He also has a history of chronic back pain, status  post back surgery; left hip surgery; left arm surgery, and right  shoulder surgery.   ALLERGIES:  No known drug allergies.   CURRENT MEDICATIONS:  1. Coreg 25 mg twice daily.  2. Norvasc 10 mg once daily.  3. Prozac 40 mg once daily.  4. Micardis 80 mg once daily.  5. Foradil 12 mcg twice daily.  6. Omega-3.  7. Aspirin 81 mg once daily.  8. Fenofibrate 200 mg once daily.  9. Hydrochlorothiazide 12.5 mg once daily.  10.Zocor 80 mg once daily.  11.Pletal 50 mg twice daily.  12.Morphine 60 mg twice daily.   REVIEW OF SYSTEMS:  As stated in the history of present illness, is  otherwise negative.   PHYSICAL EXAMINATION:  GENERAL:  He is a pleasant, well-appearing,  middle-aged Caucasian male in no acute distress.  VITAL SIGNS:  Blood pressure 140/84, pulse 64 and regular.  Respirations  12 and nonlabored.  FOCUSED LOWER EXTREMITY EXAM:  There is no lower extremity edema.  Pulses  are 1+ bilaterally in the dorsalis pedis and posterior tibialis.  Femoral artery pulses are 1 to 2+ bilaterally.  There are no ulcerations  or lesions noted over the lower extremities.  There are no varicosities  noted.  Sensation is intact over both lower extremities over the dorsum  and plantar surface of both feet.   DIAGNOSTIC STUDIES:  There are no diagnostic studies for review today.   ASSESSMENT AND PLAN:  This is a pleasant 54 year old Caucasian male with  a past medical history significant for coronary artery disease,  hyperlipidemia, hypertension, tobacco abuse, and known peripheral  vascular disease who presents with symptoms that are consistent with  claudication in both lower extremities.  The patient has had noninvasive  studies performed 2 years ago that showed significant bilateral lower  extremity disease.  Followup ankle brachial index 1 year ago was  consistent  with mild lower extremity disease.  I do not think that it  would be helpful at this time to repeat any more noninvasive studies.  The patient wishes to proceed directly to an abdominal angiogram and  runoff of both lower extremities.  We will plan on performing this at  [facility] in 2 weeks.  I will confirm this procedure along  with my colleague, Dr. Tonny Bollman.  The patient will be continued on  his Pletal 50 mg twice daily in the meantime.  We will obtain  appropriate laboratory data and a chest x-ray prior to the procedure.     Verne Carrow, MD  Electronically Signed    CM/MedQ  DD: 07/04/2008  DT: 07/05/2008  Job #: [id number]   cc:   Rollene Rotunda, MD, [facility]. Artist Pais, DO

## 2011-04-09 NOTE — Assessment & Plan Note (Signed)
[facility]                            CARDIOLOGY OFFICE NOTE   NAME:Ernest [name], Ernest [name]                 MRN:          [id number]  DATE:12/07/2008                            DOB:          [birth year]    PRIMARY CARE PHYSICIAN:  Barbette Hair. Artist Pais, DO   CONSULTING PHYSICIAN:  Stefani Dama, MD   REASON FOR PRESENTATION:  Evaluate the patient with coronary artery  disease.  He is going to have neck surgery.   HISTORY OF PRESENT ILLNESS:  The patient is a pleasant 55 year old  gentleman who is being considered for cervical spine surgery.  He has a  history of nonobstructive coronary artery disease as described below.  He most recently saw Dr. Myrene Galas for evaluation of peripheral  vascular disease.  He has been managed conservatively for this.  I last  saw him in August.  Since that time, he has stopped smoking.  He has  been followed in our [facility].  He did have lower extremity  angiogram, which demonstrated severe left external iliac stenosis.  He  had successful stenting of his left external iliac per Dr. Excell Seltzer.   The patient does do some walking, really has not done recently with the  cold weather.  He states that he still short of breath with activity,  but he thinks this is better since he stopped smoking.  He does get some  chest discomfort.  He states that this is on his right side.  It happens  at rest.  It is dull.  It may be moderate.  It goes away with time, but  may last for hours.  He is not sure that he has had this kind of  discomfort before.  He does not think there is any associated nausea,  vomiting, or diaphoresis.  He has not noticed any palpitations,  presyncope, or syncope.  He is not describing any PND or orthopnea.   PAST MEDICAL HISTORY:  1. Coronary artery disease (left main 30% stenosis, LAD 20-30%      stenosis, first and second diagonal branches 40-50% stenosis with      small arteries, circumflex 30% stenosis  in a large obtuse marginal,      the right coronary artery at 70-80% stenosis.  This was not felt to      be occlusive after was evaluated with a flow wire.  He had distal      50-60% stenosis.).  2. Well-preserved ejection fraction.  3. Previous tobacco use.  4. Dyslipidemia.  5. Hypertension.  6. Depression/anxiety.  7. Peripheral vascular disease as described.  8. Chronic low back pain.   PAST SURGICAL HISTORY:  1. Left hip surgery.  2. Back surgery.  3. Left arm surgery.  4. Right shoulder surgery.   ALLERGIES:  None.   MEDICATIONS:  1. Norvasc 10 mg daily.  2. Prozac 40 mg daily.  3. Omega 3000 mg daily.  4. Aspirin 81 mg daily.  5. Fenofibrate 200 mg daily.  6. Zocor 80 mg daily.  7. Morphine 60 mg b.i.d.  8. Carvedilol 25 mg b.i.d.  9. Remeron.  10.Ambien.  11.Chantix.   SOCIAL HISTORY:  The patient is married.  Again, he is no longer  smoking.  He does not drink alcohol.  He is currently disabled.   FAMILY HISTORY:  Not contributory for early coronary artery disease.   REVIEW OF SYSTEMS:  As stated in the HPI and negative for all other  systems.   PHYSICAL EXAMINATION:  GENERAL:  The patient is pleasant and in no  distress.  VITAL SIGNS:  Blood pressure 130/82, heart rate of 80, weight 210  pounds, and body mass index 32.  HEENT:  Eyes unremarkable; pupils equal, round, and reactive to light;  fundi not visualized, oral mucosa unremarkable.  NECK:  No jugular venous distention at 45 degrees; carotid upstroke  brisk and symmetric; no bruits, no thyromegaly.  LYMPHATICS:  No cervical, axillary, or inguinal adenopathy.  LUNGS:  Clear to auscultation bilaterally.  BACK:  No costovertebral angle tenderness.  CHEST:  Unremarkable.  HEART:  PMI not displaced or sustained; S1 and S2 within normal limits;  no S3, no S4; no clicks, no rubs, no murmurs.  ABDOMEN:  Mildly obese;  positive bowel sounds; normal in frequency and pitch; no bruits, no  rebound, no  guarding; no midline pulsatile mass; no hepatomegaly, no  splenomegaly.  SKIN:  No rashes.  No nodules.  EXTREMITIES:  2+ upper pulses, 1+ left femoral, absent right femoral,  absent dorsalis pedis and posterior tibialis on the left, 1+ dorsalis  pedis on the right; no cyanosis, no clubbing, no edema.  NEURO:  Oriented to person, place, and time; cranial nerves II through  XII grossly intact, motor grossly intact.   ASSESSMENT AND PLAN:  1. Coronary artery disease.  The patient does have coronary artery      disease and chest pain though that the pain is atypical.  He has      not been very functional recently, limited somewhat by dyspnea on      exertion, but also by the weather.  He is going for a surgery.  It      is  moderate risk from cardiovascular standpoint.  Therefore, given      this, he needs to be screening with a stress perfusion study prior      to the surgery.  Further evaluation will be based on these results.  2. Tobacco.  I am very proud of him .  He is not smoking and      encouraged him about not falling back into this habit.  3. Dyslipidemia per Dr. Artist Pais. The goal will be an LDL less than 70 and      an HDL greater than 40 given his significant cardiovascular risk      factors.  4. Hypertension.  Blood pressure is controlled and he will continue      the meds as listed.  5. Followup.  I will see him back in about 6 months or sooner if      needed.     Rollene Rotunda, MD, [facility]  Electronically Signed    JH/MedQ  DD: 12/07/2008  DT: 12/08/2008  Job #: [id number]   cc:   Stefani Dama, M.D.

## 2011-04-10 ENCOUNTER — Telehealth: Payer: Self-pay | Admitting: Internal Medicine

## 2011-04-10 NOTE — Telephone Encounter (Signed)
Spoke with patients wife-states pt is having a yeast infection in his mouth from his most recent abx and still is having the same deep cough-would like to have more cough syrup and something for the yeast infection. Aware that CY is off for the afternoon and will address this in the morning.

## 2011-04-11 ENCOUNTER — Encounter: Payer: Self-pay | Admitting: Cardiology

## 2011-04-11 ENCOUNTER — Ambulatory Visit (INDEPENDENT_AMBULATORY_CARE_PROVIDER_SITE_OTHER): Payer: Medicare PPO | Admitting: Cardiology

## 2011-04-11 DIAGNOSIS — F17200 Nicotine dependence, unspecified, uncomplicated: Secondary | ICD-10-CM

## 2011-04-11 DIAGNOSIS — I2510 Atherosclerotic heart disease of native coronary artery without angina pectoris: Secondary | ICD-10-CM

## 2011-04-11 DIAGNOSIS — I10 Essential (primary) hypertension: Secondary | ICD-10-CM

## 2011-04-11 DIAGNOSIS — I739 Peripheral vascular disease, unspecified: Secondary | ICD-10-CM

## 2011-04-11 DIAGNOSIS — E7800 Pure hypercholesterolemia, unspecified: Secondary | ICD-10-CM

## 2011-04-11 MED ORDER — FLUCONAZOLE 100 MG PO TABS: 100.0000 mg | ORAL_TABLET | Freq: Every day | ORAL | Status: AC

## 2011-04-11 MED ORDER — PROMETHAZINE-CODEINE 6.25-10 MG/5ML PO SYRP: 5.0000 mL | ORAL_SOLUTION | Freq: Four times a day (QID) | ORAL | Status: DC | PRN

## 2011-04-11 NOTE — Patient Instructions (Signed)
You will be scheduled for a doppler study to look at the blood flow in the vessels in your legs Please have a fasting lipid profile drawn Follow up with Dr Antoine Poche in 6 months

## 2011-04-11 NOTE — Progress Notes (Signed)
HPI The patient presents for follow of known coronary disease and peripheral vascular disease. Since I last saw him he one nitroglycerin for chest discomfort and difficulty breathing. Otherwise he says he is doing well. He is able to ambulate about 200 yards without chest discomfort, neck or arm discomfort. He does have chronic shortness of breath. He's been following with Dr. Maple Hudson. This is felt to be related to cigarettes and chronic lung disease. He is going to quit smoking tomorrow. He denies PND or orthopnea. He's had no palpitations, presyncope or syncope. He does describe lower extremity claudication which is slowly getting worse since his last ABI in 2009.  No Known Allergies  Current Outpatient Prescriptions  Medication Sig Dispense Refill  . albuterol (PROVENTIL) (2.5 MG/3ML) 0.083% nebulizer solution Take 3 mLs (2.5 mg total) by nebulization every 4 (four) hours as needed for wheezing or shortness of breath.  25 vial  prn  . amLODipine (NORVASC) 10 MG tablet TAKE 1 TABLET BY MOUTH DAILY  30 tablet  7  . atorvastatin (LIPITOR) 20 MG tablet Take 20 mg by mouth daily.        . budesonide (PULMICORT FLEXHALER) 180 MCG/ACT inhaler Inhale 1 puff into the lungs 2 (two) times daily.        . carvedilol (COREG) 25 MG tablet Take 25 mg by mouth 2 (two) times daily with a meal.        . clonazePAM (KLONOPIN) 1 MG tablet as needed.        . cyclobenzaprine (FLEXERIL) 10 MG tablet as needed.        . fluconazole (DIFLUCAN) 100 MG tablet Take 1 tablet (100 mg total) by mouth daily.  5 tablet  0  . FLUoxetine (PROZAC) 40 MG capsule Take 40 mg by mouth daily.        . formoterol (FORADIL AEROLIZER) 12 MCG capsule for inhaler Place 12 mcg into inhaler and inhale 2 (two) times daily.        . isosorbide mononitrate (IMDUR) 30 MG 24 hr tablet Take 30 mg by mouth daily.        . mirtazapine (REMERON) 45 MG tablet Take 45 mg by mouth at bedtime.        . modafinil (PROVIGIL) 200 MG tablet Take 200 mg by  mouth daily.        [name] morphine (AVINZA) 60 MG 24 hr capsule Take 60 mg by mouth 3 (three) times daily.        [name] morphine (MS CONTIN) 60 MG 12 hr tablet Take 60 mg by mouth 2 (two) times daily.        . Nebulizers (COMPRESSOR/NEBULIZER) MISC 1 Device by Does not apply route as directed.  1 each  0  . Omega-3 Fatty Acids (FISH OIL CONCENTRATE) 1000 MG CAPS Take 1 capsule by mouth 3 (three) times daily.        . promethazine-codeine (PHENERGAN WITH CODEINE) 6.25-10 MG/5ML syrup Take 5 mLs by mouth every 6 (six) hours as needed for cough.  200 mL  0  . DISCONTD: varenicline (CHANTIX) 0.5 MG tablet Starter pak  60 tablet  1  . DISCONTD: azithromycin (ZITHROMAX) 250 MG tablet Take 2 tablets by mouth on day 1, followed by 1 tablet by mouth daily for 4 days. 2 today then one daily       . DISCONTD: predniSONE (DELTASONE) 10 MG tablet Take 4 x 2 days, 3 x 2 days, 2 x 2 days, 1 x 2 days,  then stop  10 tablet  0  . DISCONTD: promethazine-codeine (PHENERGAN WITH CODEINE) 6.25-10 MG/5ML syrup Take 5 mLs by mouth every 6 (six) hours as needed for cough.  200 mL  0    Past Medical History  Diagnosis Date  . CAD (coronary artery disease)     Left Main 30% stenosis, LAD 20 - 30 % stenosis, first and second diagonal branchesat 40 - 50%  stenosis with small arteries, circumflex had 30% stenosis in the large obtuse marginal, RCA at 70 - 80%  stenosis [not felt to be occlusive after evaluation with flow wire], distal 50 - 60% stenosis[  . COPD (chronic obstructive pulmonary disease)   . Depression   . Anxiety   . Hyperlipidemia   . Chronic insomnia   . OSA (obstructive sleep apnea)     NPSG 09/10/10- AHI 11.3/hr  . Gout   . GERD (gastroesophageal reflux disease)   . PVD (peripheral vascular disease)     (ABI 0.9 on right and 0.89 on left)  severe left external iliac stenosis.  He  had successful stenting of his left external iliac per Dr. Excell Seltzer.  . DDD (degenerative disc disease)   . Hx of colonoscopy      Past Surgical History  Procedure Date  . Spinal fusion 03/05/2007    L4-L5  . Hip surgery     left  . Arm surgery     left  . Shoulder surgery     right  . C-spine surgery     ROS:  Claudication.  Otherwise as stated in the HPI and negative for all other systems.  PHYSICAL EXAM BP 140/80  Pulse 68  Resp 18  Ht 5\' 7"  (1.702 m)  Wt 199 lb (90.266 kg)  BMI 31.17 kg/m2 GENERAL:  Well appearing HEENT:  Pupils equal round and reactive, fundi not visualized, oral mucosa unremarkable, dentures NECK:  No jugular venous distention, waveform within normal limits, carotid upstroke brisk and symmetric, no bruits, no thyromegaly LYMPHATICS:  No cervical, inguinal adenopathy LUNGS:  Course rhonchi  bilaterally BACK:  No CVA tenderness CHEST:  Unremarkable HEART:  PMI not displaced or sustained,S1 and S2 within normal limits, no S3, no S4, no clicks, no rubs, no murmurs ABD:  Flat, positive bowel sounds normal in frequency in pitch, no bruits, no rebound, no guarding, no midline pulsatile mass, no hepatomegaly, no splenomegaly EXT:  2 plus pulses upper, DP/PT left 2 plus, absent DP/PT right, no edema, no cyanosis no clubbing SKIN:  No rashes no nodules NEURO:  Cranial nerves II through XII grossly intact, motor grossly intact throughout PSYCH:  Cognitively intact, oriented to person place and time   EKG:  Sinus rhythm, rate 67, axis within normal limits, intervals within normal limits, no acute ST-T wave changes.   ASSESSMENT AND PLAN

## 2011-04-11 NOTE — Assessment & Plan Note (Signed)
I encourage complete abstinence and he is taking Chantix.

## 2011-04-11 NOTE — Assessment & Plan Note (Signed)
No further cardiovascular testing is suggested. He will continue with risk reduction.

## 2011-04-11 NOTE — Assessment & Plan Note (Signed)
The blood pressure continues to be high. I have instructed the patient to record a blood pressure diary and recording this. This will be presented for my review and pending these results I will make further suggestions about changes in therapy for optimal blood pressure control.  

## 2011-04-11 NOTE — Assessment & Plan Note (Signed)
I will check a fasting lipid profile when he returned with a goal LDL less than 100 and HDL greater then 40.

## 2011-04-11 NOTE — Assessment & Plan Note (Signed)
He has had percutaneous revascularization of his left external iliac in the past. In 2009 the right ABI was 0.86 with a left of 0.81. Symptoms have worsened and I will check another ABI.

## 2011-04-11 NOTE — Telephone Encounter (Signed)
meds have been called to the pharmacy for the pt and the pt is aware.

## 2011-04-11 NOTE — Telephone Encounter (Signed)
Per CY-okay to give Phenergan with codeine syrup #229ml 5ml every 6 hours prn no refills and Diflucan 150mg  #5 take 1 by mouth daily no refills.Ernest [name]

## 2011-04-12 ENCOUNTER — Telehealth: Payer: Self-pay | Admitting: Internal Medicine

## 2011-04-12 MED ORDER — CEFDINIR 300 MG PO CAPS: 300.0000 mg | ORAL_CAPSULE | Freq: Two times a day (BID) | ORAL | Status: AC

## 2011-04-12 MED ORDER — MOXIFLOXACIN HCL 400 MG PO TABS: 400.0000 mg | ORAL_TABLET | Freq: Every day | ORAL | Status: DC

## 2011-04-12 NOTE — Assessment & Plan Note (Signed)
[facility]                                 ON-CALL NOTE   NAME:Drury, IGNACE                   MRN:          [id number]  DATE:06/20/2007                            DOB:          Jul 16, 1956    TIME OF CALL:  7:26pm.   PRIMARY CARE PHYSICIAN:  Dr. Artist Pais.   TELEPHONE NUMBER:  [phone]   The message on my pager said stomach problems. I attempted to call  this number four times over the next hour with no answer. Finally, at  8:20pm I left a message on the answering machine to call me back if they  still needed me.     Tera Mater. Clent Ridges, MD  Electronically Signed    SAF/MedQ  DD: 06/21/2007  DT: 06/22/2007  Job #: [id number]

## 2011-04-12 NOTE — Telephone Encounter (Signed)
avelox is too expensive for the pt.  Please advise of another abx that can be called in. thanks

## 2011-04-12 NOTE — Cardiovascular Report (Signed)
NAME:  Ernest, [name] NO.:  [id number]   MEDICAL RECORD NO.:  [id number]          PATIENT TYPE:  OIB   LOCATION:  1963                         FACILITY:  [facility]   PHYSICIAN:  Charlton Haws, M.D.     DATE OF BIRTH:  [birth year]   DATE OF PROCEDURE:  02/17/2006  DATE OF DISCHARGE:  02/17/2006                              CARDIAC CATHETERIZATION   Catheterization.   The patient is 55 years old.  He has been having exertional chest pain and  dyspnea.  Right and left heart cath were done to assess a source of  shortness of breath and chest pain.   The patient also has significant hypertension, claudication, selective renal  angiograms and distal aortogram was also performed.   Cine catheterization was done with a 6-French right femoral venous sheath  and a 4-French right femoral artery sheath.   Left main coronary had 30% distal stenosis.   Left anterior descending artery had 20-30% multi-discrete lesion in the  proximal and mid-vessel.  Distal vessel was large and wrapped the apex.  The  patient had 40-50% disease in the first and second diagonal branches which  were small arteries.   Circumflex coronary artery was nondominant.  There was one large obtuse  marginal branch.  The proximal vessel had 30% multi-discrete lesions as did  a large obtuse marginal branch.   Right coronary artery was somewhat small.  There was diffuse 50-60% disease  proximally.  There was a long segment of 70% to possibly 80% disease in the  mid to distal vessel.   RIGHT HEART CATHETERIZATION:  Right heart catheterization showed normal  pressure with no evidence of pulmonary hypertension.   Mean right atrial pressure was 13, RV pressure was 37/13.  PA pressure was  34/20.  Mean pulmonary capillary wedge pressure was 18.   Aortic pressure is 180/77, LV pressure is 185/15.   Fick cardiac output was 2.4 liters per minute.   RAO VENTRICULOGRAPHY:  RAO ventriculography showed normal LV  function.  EF  was 60%.  There was no gradient across the aortic valve and no MR.   Selective injection of the renals showed them to be widely patent on both  left and right sides.   Distal aortogram showed no significant distal aortic or iliac disease.   IMPRESSION:  Patient has normal left ventricular function without evidence  of pulmonary hypertension.  There is no renal artery stenosis and no distal  aortic disease.  He has significant essential hypertension.  He was treated  with enalapril in the lab.   He has single-vessel coronary disease.  I will review the films with Dr.  Antoine Poche but I suspect that despite needing a fairly large length of stent  that we should probably intervene in the mid to distal right coronary  artery.   The patient tolerated the procedure.           ______________________________  Charlton Haws, M.D.     PN/MEDQ  D:  02/17/2006  T:  02/18/2006  Job:  [id number]   cc:   Rollene Rotunda, M.D.  [address]  [place]  [place] [address]
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
## 2011-04-12 NOTE — Telephone Encounter (Signed)
LMTCB

## 2011-04-12 NOTE — Procedures (Signed)
[facility]  Patient:    Ernest [name], Ernest [name]                 MRN: [id number] Proc. Date: 05/27/01 Adm. Date:  [id number] Attending:  Orland Mustard CC:         Justine Null, M.D. [facility]   Procedure Report  PROCEDURE:  Esophagogastroduodenoscopy.  MEDICATIONS:  Hurricane spray, fentanyl 100 mcg, Versed 14 mg IV.  INDICATIONS FOR PROCEDURE:  Persistent upper GI symptoms with negative ultrasound and CT. The cause of these is not clear. Labs have also been unremarkable.  DESCRIPTION OF PROCEDURE:  The procedure had been explained to the patient and consent obtained. With the patient in the left lateral decubitus position, the Olympus upper endoscope was inserted and advanced under direct visualization. The stomach was entered, duodenum entered after the pyloric channel had been passed. The duodenum including the bulb and second portion was completely normal. There was no gastric outlet obstruction. The pyloric channel was normal. The antrum and body was seen well and was normal. The fundus and cardia seen on retroflexed view and was normal. The distal esophagus was somewhat reddened and there were several small erosions that had the appearance of possibly mild ulcerative esophagitis. The scope was withdrawn. The more proximal esophagus was unremarkable. The patient tolerated the procedure well.  ASSESSMENT:  Erosions in the distal esophagus probably mild ulcerative esophagitis.  PLAN:  Will obtain and gastric emptying scan and will continue on Nexium daily. Will see to be back in the office in three weeks or so. DD:  05/27/01 TD:  05/27/01 Job: [id number]

## 2011-04-12 NOTE — Letter (Signed)
December 15, 2008    Stefani Dama, MD  [address]  [address]  [place], [place] [address]   RE:  JASAUN, CARN  MRN:  [id number]  /  DOB:  [birth year]   Dr. Danielle Dess,   I recently saw Mr. Stanly for followup of his coronary artery  disease and for preoperative evaluation.  He is being considered for  cervical spine surgery.  This gentleman has a history of coronary artery  disease.  He is not particularly active and does have some dyspnea with  exertion.  Therefore, according to ACC/AHA guidelines the patient was  screened with a stress perfusion study prior to this surgery.  It is  happy to see that this demonstrated an EF of 61% with no evidence of  ischemia or infarct.  Therefore, no further cardiovascular testing is  suggested.  He would be at acceptable risk for the planned procedure.  He should continue on the medications as listed, although certainly he  need to discontinue the aspirin that would be reasonable.  I would be  happy to see him if there are any questions arise at the time of his  surgery.  If he any questions about this letter, please do not hesitate  to give me a call on my cell phone at [phone].    Sincerely,      Rollene Rotunda, MD, [facility]  Electronically Signed    JH/MedQ  DD: 12/15/2008  DT: 12/16/2008  Job #: [id number]

## 2011-04-12 NOTE — Cardiovascular Report (Signed)
NAME:  Ernest [name], Ernest [name] NO.:  [id number]   MEDICAL RECORD NO.:  [id number]          PATIENT TYPE:  OIB   LOCATION:  2899                         FACILITY:  [facility]   PHYSICIAN:  Salvadore Farber, M.D. LHCDATE OF BIRTH:  23-Oct-1956   DATE OF PROCEDURE:  02/20/2006  DATE OF DISCHARGE:  02/20/2006                              CARDIAC CATHETERIZATION   PROCEDURE:  Pressure wire interrogation of the right coronary artery,  Starclose closure of the right common femoral arteriotomy site.   INDICATIONS:  Mr. Meneely is a 55 year old gentleman with hypertension,  ongoing tobacco abuse who presents with some atypical chest discomfort but  also dyspnea at rest.  These symptoms were disassociated from one another.  For evaluation of these symptoms, he underwent diagnostic angiography by Dr.  Eden Emms earlier this week.  Dr. Eden Emms felt him to have a severe stenosis of  the mid-RCA and scheduled him for intervention today.  Upon my review of the  films, I thought the mid-RCA lesion was at least moderate.  It was not clear  to me that it was severe enough to explain his episodic dyspnea at rest.  After discussion with Dr. Antoine Poche, we therefore decided to proceed with  pressure wire interrogation of the vessel.   PROCEDURAL TECHNIQUE:  Informed consent was obtained.  Under 1% lidocaine  local anesthesia, a 6-French sheath was placed in the right common femoral  artery using modified the Seldinger technique.  Anticoagulation was  initiated with bivalirudin.  ACT was confirmed to be greater than 225  seconds.  A 6-French JR-4 guide was advanced over wire and engaged in the  ostium of the RCA.  A pressure wire was then advanced.  The pressures were  normalized in the aorta.  The pressure wire was then advanced into the  distal RCA.  Resting pressure ratio was 0.96.  I then measured FFR after the  administration of 24 mcg of intracoronary adenosine.  Three successive  measurements  were 0.81, 0.79, and 0.83.  I pulled the pressure wire back  gradually and demonstrated approximately two-thirds of the drop in the  pressure was due to the lesion after the second acute marginal branch and  the remainder due to the long moderate stenosis at the proximal portion of  the vessel.   IMPRESSION/PLAN:  The patient has angiographically moderate narrowing of the  right coronary artery (approximately 60%).  FFR demonstrates this not to be  significant.  The FFR is far from the level that we would expect to cause  resting symptoms.  Thus, his coronary disease does not explain his symptoms.  We will check a pulmonary function test and have him follow up with Dr. Artist Pais  and Dr. Antoine Poche.      Salvadore Farber, M.D. [facility]  Electronically Signed     WED/MEDQ  D:  02/20/2006  T:  02/21/2006  Job:  [id number]   cc:   Rollene Rotunda, M.D.  [address]  [address]  [place]  [place] [address]   Thomos Lemons, D.O. [facility]  [address] [place], [place] [address]

## 2011-04-12 NOTE — Telephone Encounter (Signed)
Per CY--change the avelox to cefdinir 200mg   #14  1 bid until gone.  thanks

## 2011-04-12 NOTE — Discharge Summary (Signed)
NAME:  Ernest [name], Ernest [name] NO.:  [id number]   MEDICAL RECORD NO.:  [id number]          PATIENT TYPE:  INP   LOCATION:  3003                         FACILITY:  [facility]   PHYSICIAN:  Stefani Dama, M.D.  DATE OF BIRTH:  [birth year]   DATE OF ADMISSION:  03/05/2007  DATE OF DISCHARGE:  03/10/2007                               DISCHARGE SUMMARY   ADMITTING DIAGNOSES:  1. Lumbar spondylosis with herniated nucleus pulposus L4-L5.  2. Extraforaminal right lumbar radiculopathy.   POSTOPERATIVE DIAGNOSES:  1. Lumbar spondylosis with herniated nucleus pulposus L4-L5.  2. Extraforaminal right lumbar radiculopathy.   PROCEDURES:  Transforaminal decompression of the L4 and L5 nerve roots,  transforaminal fusion with PEEK bone spacer, local autograft and  allograft, pedicle screw fixation L4-L5, posterolateral arthrodesis with  local autograft and allograft on March 05, 2007.   CONDITION ON DISCHARGE:  Improving.   In addition to that, the Final  Diagnoses include:  1. Tobacco abuse.  2. Chronic obstructive pulmonary disease.  3. Rheumatoid arthritis condition.   HOSPITAL COURSE:  [name] is a 55 year old individual was has had  significant back and right lower extremity pain secondary to a large  extraforaminal disk herniation at L4-L5.  He had marked spondylosis at  that level, and he was advised that surgical intervention be undertaken.  He required surgical decompression and arthrodesis.  This was performed  on March 05, 2007, and the patient has been tolerating it well.  He was  slow to mobilize and had an ileus for the first 3 days postoperatively;  however, on the fourth hospital day his bowels have moved.  He is  feeling substantially better.  His incision is clean and dry, and he has  been advised as to his postoperative activities.  He will be seen the  office in three weeks' time for further followup.  He is given a  prescription for Vicodin, #60 without  refills, Flexeril 10 mg, #30 with  p.r.n. refills.   CONDITION ON DISCHARGE:  Improving.      Stefani Dama, M.D.  Electronically Signed     HJE/MEDQ  D:  03/10/2007  T:  03/10/2007  Job:  [id number]

## 2011-04-12 NOTE — Op Note (Signed)
[facility]  Patient:    Ernest [name], Ernest [name] Visit Number: [id number] MRN: [id number]          Service Type: SUR Location: 3W 0366 01 Attending Physician:  Meredith Leeds Dictated by:   Zigmund Daniel, M.D. Proc. Date: 05/07/02 Admit Date:  05/07/2002   CC:         Fayrene Fearing L. Randa Evens, M.D.   Operative Report  SURGEON:  Zigmund Daniel, M.D.  DESCRIPTION OF PROCEDURE:  After the patient had induction of general anesthesia and routine preparation and draping of the abdomen and also had a Foley catheter inserted, I made a small midline supraumbilical incision and dissected down to the midline fascia and opened it longitudinally and then opened the peritoneum bluntly and put in a 0 Vicryl pursestring suture in the fascia.  After inserting a Hasson cannula and securing it with the pursestring and inflating the abdomen with CO2, I viewed the abdominal contents and saw no obvious abnormalities.  The sigmoid colon was initially somewhat hard to view because small bowel was covering it.  There were no gross abnormalities present.  After placing two right lower quadrant 5 mm ports and tilting the patient to the left and placing him head-down and foot-up, I pulled the small up out of the pelvis and pulled it over toward the right side of the abdomen and then could see the sigmoid colon.  It was adherent to the pelvic sidewall in the mid portion.  It was not acutely inflamed.  Using the harmonic scalpel, I took down the adhesions and then mobilized the descending colon and splenic flexure using entirely the harmonic scalpel for the mobilization.  I felt I had good proximal mobility.  I then dissected down into the pelvis along the sidewall of the sigmoid and mobilized at almost to the cul-de-sac.  I did not find any evidence of any further inflammation down in the pelvis.  I then mobilized it lateral to medial.  It required one more left lower  quadrant port to achieve adequate mobility of the splenic flexure to allow easy resection of the sigmoid.  I then moved the colon around and could easily bring it up to the abdominal wall, down toward the pelvis, and across the midline.  I cut a couple of adhesions of the mesentery medially and felt that I had dome adequate mobilizing and dissection.  I then made a short left lower quadrant muscle-splitting incision of the McBurney type and entered the peritoneum.  I then grasped the colon, and it came nicely up into the wound.  I clamped to it proximally with an Allen clamp and then a Kocher clamp just distal to that and divided it.  I then segmentally divided the sigmoid mesentery up close to the colon between [facility] clamps and ligated the vessels with 2-0 silks.  I then put two marking stay sutures distally and clamped proximal to them and divided the bowel at the rectosigmoid area.  There were no gross abnormalities except for some diverticular ostia in the sigmoid.  I then approximated the end-systolic of the bowel and performed an end-to-end anastomosis in one layer with 3-0 silk suture, tying the posterior layer inside the bowel and the anterior layer outside.  I saw no abnormalities of the mucosa distally.  I then removed the spring clamp and placed one suture in the mesentery to approximate the defect there.  I then replaced the bowel within the abdomen.  I closed the muscle-splitting  incision in layers using 2-0 Vicryl for the peritoneum and #1 PDS for the two muscle layers.  I stapled the skin of that incision.  I put the scope pack in and viewed the operative area and saw no evidence of bleeding or leakage of intestinal contents.  Sponge, needle, and instrument counts were correct.  I then allowed the CO2 to escape and tied the pursestring suture after removing the right lower quadrant ports under direct vision.  I then stapled the other incisions.  The patient tolerated  the operation well. Dictated by:   Zigmund Daniel, M.D. Attending Physician:  Meredith Leeds DD:  05/07/02 TD:  05/09/02 Job: [id number]

## 2011-04-12 NOTE — Telephone Encounter (Signed)
Spouse aware of new rx for cefdinir and this has been sent to the pharmacy

## 2011-04-12 NOTE — Assessment & Plan Note (Signed)
[facility]                               PULMONARY OFFICE NOTE   NAME:Ernest [name], Ernest [name]                 MRN:          [id number]  DATE:06/17/2006                            DOB:          02-12-1956    PROBLEM LIST:  1.  Chronic obstructive pulmonary disease.  2.  Ongoing tobacco use.  3.  History of tussive syncope.  4.  Insomnia.   HISTORY:  Still smoking.  He says he has a prescription for Chantix but  has not really been using it.  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  We talked a little bit about exercise  tolerance, smoke exposure, and options, but he says he is trying to find  work.  Daily cough with white phlegm.  No blood.  Nothing purulent.  No  chest pain or palpitation.   MEDICATIONS:  1.  Metoprolol 50 mg b.i.d.  2.  Amlodipine 5 mg  3.  Clorazepate 15 mg p.r.n.  4.  Aspirin 325 mg.  5.  Cymbalta 60 mg.  6.  Benicar 40 mg.  7.  Crestor 10 mg.  8.  Chantix.  9.  Lithium ER 450 mg x 1/2 b.i.d.  10. Foradil b.i.d.  11. Pulmicort 1 puff b.i.d.  12. Clorazepate 15 mg for sleep p.r.n.  13. Vicodin p.r.n.   Drug intolerant of CODEINE and ASPIRIN.   OBJECTIVE:  VITAL SIGNS:  Weight 179 pounds, BP 140/72, pulse regular at 55,  room air saturation 98%.  GENERAL:  He looks almost tearful.  LUNGS:  Breath sounds have diminished, but without wheeze, cough, or  dullness.  Work of breathing is not increased.  HEART:  Heart sounds are regular, without murmur or gallop.  EXTREMITIES:  I find no adenopathy, cyanosis, clubbing, or edema.   IMPRESSION:  1.  Chronic obstructive pulmonary disease.  2.  Chest complaint of pain at last visit has resolved.   PLAN:  1.  Supportive encouragement to quit smoking.  2.  Sample and prescription to stick with Pulmicort using Flexhaler 1 puff      b.i.d.  3.  Schedule return in 4 months, earlier p.r.n.                                   Clinton D.  Maple Hudson, MD, FCCP, FACP   CDY/MedQ  DD:  06/17/2006  DT:  06/18/2006  Job #:  [id number]

## 2011-04-12 NOTE — Telephone Encounter (Signed)
Spoke with pt's spouse and advised rx for avelox sent to pharm. She verbalized understanding.

## 2011-04-12 NOTE — Assessment & Plan Note (Signed)
[facility]                               PULMONARY OFFICE NOTE   NAME:Ernest [name], Ernest [name]                 MRN:          [id number]  DATE:10/14/2006                            DOB:          1956-06-23    PROBLEMS:  1. Chronic obstructive pulmonary disease.  2. Ongoing tobacco use.  3. Depression.  4. History of tussive syncope.  5. Insomnia.   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 He has had flu vaccine. Occasional sharp twinge  right anterolateral chest wall comes and goes with no pattern. He uses  Foradil and Pulmicort which seem to help some. Not much cough since he has  cut a bit on his smoking. He cannot afford the Chantix. Sputum is only white  with no blood, nothing purulent.   MEDICATIONS:  1. Metoprolol 50 mg b.i.d.  2. Norvasc 5 mg.  3. Aspirin 325 mg.  4. Benicar.  5. Crestor.  6. Foradil 1 b.i.d.  7. Pulmicort 1 puff b.i.d.  8. Remeron 30 mg.  9. Clonazepam 1 mg in the morning and 2 nightly are continued.  10.Potassium 20 mEq.  11.Pletal 100 mg.  12.Prozac 20 mg.  Psychiatric medications are prescribed through Dr. Donell Beers.  Drug intolerance of CODEINE and ASPIRIN.   OBJECTIVE:  Weight 198 pounds which, if correct, would be almost an almost  20 pound increase since July. Blood pressure 132/66, pulse regular 70, room  air saturation 97%. There is mild and expiratory wheeze bilaterally without  increased work of breathing. Heart sounds are regular without murmur. He has  old scar injury on his left arm. 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   IMPRESSION:  1. Asthma with chronic obstructive pulmonary disease.  2. Ongoing tobacco abuse with a component of depression.  3.  Small lung nodules, uncertain significance.   PLAN:  1. I spent time on a careful discussion of smoking, options for quitting      and motivation.  2. Chest x-ray.  3. Schedule 6 months, earlier P.R.N.     Clinton D. Maple Hudson, MD, Tonny Bollman, FACP  Electronically Signed    CDY/MedQ  DD: 10/15/2006  DT: 10/16/2006  Job #: [id number]

## 2011-04-12 NOTE — Telephone Encounter (Signed)
Spouse says the Avelox will cost $95 and she cannot afford to pay for that. She is requesting a cheaper alternative be called to the pharmacy/ Pls advise.No Known Allergies

## 2011-04-12 NOTE — Assessment & Plan Note (Signed)
[facility]                                 ON-CALL NOTE   NAME:Frickey, RMANI KELLOGG                 MRN:          [id number]  DATE:12/05/2006                            DOB:          June 19, 1956    Mr. Jue's wife called me this evening because the patient had had  an attack of gout.  The patient had been on colchicine in the past but  had not had his prescription filled in over a year.  The patient's wife  is requesting a prescription for his colchicine to be called in to [facility] to relieve his symptoms.  The [facility]'s pharmacy is [facility] on  [address] with a phone number of [phone].  I have called in a  prescription for colchicine so that the patient may find relief.  Colchicine prescription is 0.6 mg.  He is to take one t.i.d. x1 day with  following up on one once a day.  The patient has also been given  refills.  He is advised to follow up with his primary care physician  should symptoms persist.      Bettey Mare. Lyman Bishop, NP  Electronically Signed      Bevelyn Buckles. Bensimhon, MD  Electronically Signed   KML/MedQ  DD: 12/05/2006  DT: 12/06/2006  Job #: [id number]

## 2011-04-12 NOTE — Telephone Encounter (Signed)
Spouse called back stated Aveolox was $95.00 would like something less expensvie called in. She can be reached at [phone].Vedia Coffer

## 2011-04-12 NOTE — Op Note (Signed)
NAME:  Ernest [name], Ernest [name] NO.:  [id number]   MEDICAL RECORD NO.:  [id number]          PATIENT TYPE:  OIB   LOCATION:  5017                         FACILITY:  [facility]   PHYSICIAN:  Claude Manges. Whitfield, M.D.DATE OF BIRTH:  1955-12-30   DATE OF PROCEDURE:  11/20/2006  DATE OF DISCHARGE:                               OPERATIVE REPORT   PREOPERATIVE DIAGNOSIS:  Right shoulder impingement with anterior labral  tear, possible rotator cuff tear.   POSTOPERATIVE DIAGNOSIS:  Right shoulder impingement, right shoulder  anterior glenoid labral tear.   PROCEDURES:  1. Arthroscopic debridement of right shoulder.  2. Arthroscopic anterior labral repair.  3. Arthroscopic subacromial decompression.  4. Mini open exploration of rotator cuff.   SURGEON:  Dr. Cleophas Dunker   ASSISTANT:  Arnoldo Morale, [facility]   ANESTHESIA:  General.   COMPLICATIONS:  None.   HISTORY:  A 55 year old gentleman has been followed for problems  referable to his right shoulder over the last several months. He had  evidence of positive impingement and painful overhead arc with  tenderness over the lateral and anterior aspect of the shoulder.  He has  had a previous arthroscopy in 1993 and a previous open distal clavicle  resection. The MRI scan revealed some supraspinatus and infraspinatus  tendinosus changes with some bursal surface and undersurface  irregularity and thinning. There were two areas that may represent full-  thickness tears without retraction.  There was some irregularity of the  labrum possibly consistent with the labral tear and some irregularity of  the biceps tendon. Because he has not responded to time and medicine.  He is to have an arthroscopic evaluation.   PROCEDURE:  With the patient comfortable operating table and under  general orotracheal anesthesia the patient was placed in the semi-  sitting position with a shoulder frame. The right shoulder was then  prepped from the base of  neck circumferentially to the mid forearm.  The  patient had an arthroscopic wrist procedure by Artist Pais. Mina Marble, M.D.  2 days prior to this surgery with a bulky dressing and volar wrist  splint. This was left intact until after the procedure when the dressing  was changed under Dr. Ronie Spies suggestion.   Sterile draping was then performed.   A marking pen was used to outline the [facility] joint, the coracoid and the  acromion at a point a fingerbreadth posterior medial to the post angle  acromion, a small stab wound was made. The arthroscope was then easily  placed in the shoulder joint. Diagnostic arthroscopy revealed an intact  humeral head and glenoid with evidence of chondromalacia.  There was  indeed a tear of the anterior glenoid labrum.  A second portal was  established anteriorly and probing of the labrum confirmed the superior  labral tear extending from about the 2 o'clock position to about the 12  o'clock position.  There were no loose bodies.  There was some mild  synovitis.  There was also evidence of a partial rotator cuff tear of  the supraspinatus at its attachment to the humeral head.  A second  portal was established as identified  above with a cannula, shaving of  the partial rotator cuff tear was performed.  We proceeded with an  anterior glenoid labral repair using the Arthrex push lock anchor. A 4  mm hooded bur was used to debride down to bare bone underneath the area  of the tear. The last screw was then used to puncture the labrum and a  #1 FiberWire was then inserted, push lock anchor was placed over the  drill hole made with the drill guide into the anterior glenoid. The  plastic cannula was placed in the hole and then we carefully threaded  the push lock anchor into the hole removing the guide.  It was nicely  inserted.  We had excellent apposition of the labrum to the anterior  glenoid.  The suture was then cut at the level of the labrum after  carefully  probing it and I felt sure that the anchor was is in good  position and stable. Remainder of the joint appeared to be clear.   The small cannula was then placed into the subacromial space anteriorly.  The arthroscope placed in subacromial space posteriorly and third portal  established in a lateral subacromial space. Arthroscopic subacromial  decompression was performed.  There was considerable bursal tissue.  I  did not see any evidence of rotator cuff tear but because there was  evidence by MR scan I elected to proceed with an open exploration  through a less than 1 inch incision. Prior to that I did perform  anterior-inferior acromioplasty with a 6 mm bur as there was some  recurrent spurring and overhang of the acromion.  I had a very nice  decompression.   About a three-quarter inch incision was then made over the anterior  lateral aspect of the shoulder via sharp dissection carried down to  subcutaneous tissue.  Gross bleeders were Bovie coagulated.  Deltoid  fibers were identified, incised along their lines. The subacromial space  was entered.  There was still some bursal tissue that was removed but as  I carefully evaluated the cuff.  I did not see an obvious tear.  There  were some bursal surface irregularities as identified by MR scan beneath  the area where there had been some impingement, but no full-thickness  tearing. These areas were carefully debrided by finger palpation. I  thought I had excellent decompression.  The wound was then irrigated  with saline solution.  Deltoid fascia closed with a running 0 Vicryl,  subcuticular with 2-0 Vicryl, skin closed with Steri-Strips.  Marcaine  with epinephrine injected in the wound edges.  Sterile bulky dressing  applied.   We did dressing change to the right wrist.  The bulky dressing and volar  wrist splint were removed.  The wounds were without evidence of  infection or significant edema. A sterile bulky dressing was  applied followed by a new volar wrist splint and an Ace bandage.   The patient was then awoken returned to the post anesthesia recovery in  satisfactory condition.  We will plan to keep him overnight with his  history of coronary artery disease and COPD and discharge him in the  morning should he be stable.      Claude Manges. Cleophas Dunker, M.D.  Electronically Signed     PWW/MEDQ  D:  11/20/2006  T:  11/20/2006  Job:  [id number]

## 2011-04-12 NOTE — Op Note (Signed)
NAME:  Ernest [name], Ernest [name] NO.:  [id number]   MEDICAL RECORD NO.:  [id number]          PATIENT TYPE:  INP   LOCATION:  3003                         FACILITY:  [facility]   PHYSICIAN:  Stefani Dama, M.D.  DATE OF BIRTH:  [birth year]   DATE OF PROCEDURE:  03/05/2007  DATE OF DISCHARGE:                               OPERATIVE REPORT   PREOPERATIVE DIAGNOSIS:  Lumbar herniated nucleus pulposus L4-L5 with  right lumbar radiculopathy, lumbar spondylosis L4-L5.   POSTOPERATIVE DIAGNOSIS:  Lumbar herniated nucleus pulposus L4-L5 with  right lumbar radiculopathy, lumbar spondylosis L4-L5.   PROCEDURE:  Extraforaminal diskectomy L4-L5 with decompression of the L4  nerve root, transforaminal interbody arthrodesis with PEEK spacer, local  autograft and allograft, posterolateral fusion with autograft and  allograft, pedicle screw fixation L4-L5.   SURGEON:  Stefani Dama, M.D.   FIRST ASSISTANT:  Clydene Fake, M.D.   ANESTHESIA:  General endotracheal.   INDICATIONS:  Ernest [name] is a 55 year old individual who has had  a herniated nucleus pulposus in the foraminal space at L4-L5.  He had  spondylitic disease at the L4-L5 level and he has been advised regarding  surgical intervention.   PROCEDURE:  The patient was brought to the operating room, placed supine  on the stretcher; after smooth induction of general endotracheal  anesthesia, he was turned prone.  The back was then prepped with alcohol  and DuraPrep and draped in a sterile fashion.  Midline incision was  created in the lower lumbar spine and carried down to the lumbodorsal  fascia which was opened on either side of the midline.  Spinous process  of L4 was identified positively on a radiograph.  Then the dissection  was taken out into the extraforaminal space over the facette joint of L4-  L5 exposing the transverse process of L4 and L5.  Self-retaining  retractors were placed deep into the wound.  The  extraforaminal space  was then explored removing the intertransverse muscle and fascia  overlying this to expose the L4 nerve root.  The superior articular  process of L4-L5 was then taken down in the inferior facette of L4 was  removed in total.  This exposed the L4 nerve root clearly and identified  a bulge of the L4 nerve root superiorly and laterally and underneath  this was noted be a significant herniation of the disk.  This was  incised with #15 blade and a combination of Kerrison rongeurs was used  to evacuate the ruptured portion of the disk which was in the  subligamentous space and also to evacuate the disk space itself.  Substantial quantity of severely degenerated disk was encountered and  this was all removed until the cartilaginous endplates were exposed and  then taken down with a curette and a cutting disk shaper.  The  interspace was expanded then by placing a spreader in the interspinous  space.  The opposite facette joint was also trimmed off the inferior  aspect to provide a good bony surface for fusion in the facette joint  space.  With this then, the disk space was prepared for grafting and  it  was sized with a 13 mm spacer and ultimately a 13 mm PEEK TLIF spacer  was filled with a combination of autologous bone graft mixed with 5 mL  of Osteocel.  Prior to placing of the bone graft, Jamshidi needle was  used to obtain bone marrow aspirate from the vertebral bodies of L4-L5.  This was spread then over two pieces of Healos sponge which was used for  grafting in the posterior and posterolateral spaces.  The interbody  spacer was then placed into the interspace at L4-L5.  The interspace was  then filled with remainder of the autologous bone graft and allograft  mixture.  Care was taken to protect the L4 nerve root all during this  procedure.  After this, fluoroscopy was used to place pedicle screws  into L4-L5 with position being checked in the AP and lateral   projections.  Care was also taken during the tapping of the holes that  no cutout was noted.  With this, 6.5 x 45 mm screws were placed in L4-  L5. 35 mm rods were placed between the construct posteriorly and these  were tightened to the preset torque.  The posterior wound was then  irrigated copiously with antibiotic irrigating solution.  Posterolateral  bone graft was accomplished with the Healos sponge.  After adequate  decortication of the posterolateral elements, the lumbodorsal fascia was  then closed with #1 Vicryl in interrupted fashion, 2-0 Vicryl was used  subcutaneous and subcuticular tissues and dry sterile dressing was  placed on the wound.  The patient tolerated the procedure well and was  returned to recovery room in stable condition.      Stefani Dama, M.D.  Electronically Signed     HJE/MEDQ  D:  03/06/2007  T:  03/06/2007  Job:  [id number]

## 2011-04-12 NOTE — Telephone Encounter (Signed)
Per CY--avelox 400mg     1 daily x 5 days.  #5 with no refills. thanks

## 2011-04-12 NOTE — H&P (Signed)
[facility]  Patient:    Ernest [name], Ernest [name] Visit Number: [id number] MRN: [id number]          Service Type: SUR Location: 3W 0366 01 Attending Physician:  Meredith Leeds Dictated by:   Zigmund Daniel, M.D. Admit Date:  05/07/2002                           History and Physical  CHIEF COMPLAINT:  Diverticulitis.  PRESENT ILLNESS:  The patient is a 55 year old white male who has had multiple episodes of left lower quadrant abdominal pain diagnosed as diverticulitis and treated with many episodes of antibiotics.  Colonoscopy by Dr. Fayrene Fearing L. Randa Evens shows no abnormalities except for diverticulosis of the sigmoid.  The patient has been free of acute pain for some time but has had recent episodes of typical diverticulitis.  He saw me in the office and agreed to have a sigmoid colectomy in hopes of preventing further episodes.  He realizes there are some risks of surgery.  He is admitted for a laparoscopic-assisted sigmoid colectomy after having taken bowel prep at home.  PAST MEDICAL HISTORY:  The patient has a history of depression and is on Vivactil 10 mg daily.  He takes Halcion 0.25 mg at bedtime for sleep.  He has high blood pressure and is on Zestril 20 mg daily.  He takes Nexium from time to time for reflux.  He has chronic constipation and takes Miralax.  He takes Valium from time to time for nerves.  Remainder of the history is unremarkable.  SOCIAL HISTORY:  The patient is a smoker.  He does not drink alcoholic beverages.  He smokes about two packs per day.  PAST SURGICAL HISTORY:  He has had no previous major operations.  ALLERGIES:  No allergies but ASPIRIN causes GI upset.  PHYSICAL EXAMINATION:  GENERAL:  Healthy-appearing man.  Mental status normal.  VITAL SIGNS:  Unremarkable per nursing staff.  HEENT:  Head, neck, eyes, ears, nose, mouth and throat unremarkable.  CHEST:  Clear to auscultation.  HEART:  Heart rate  and rhythm normal.  No murmur or gallop.  ABDOMEN:  No mass, tenderness or organomegaly or hernia.  RECTAL:  Not repeated but recently normal.  SKIN:  No lesions noted.  LYMPH NODES:  None enlarged.  NEUROLOGIC:  Grossly normal.  IMPRESSION: 1. Recurrent diverticulitis of the sigmoid colon. 2. History of depression. 3. Hypertension.  PLAN:  Elective sigmoid colectomy.  This will be attempted laparoscopically. Dictated by:   Zigmund Daniel, M.D. Attending Physician:  Meredith Leeds DD:  05/07/02 TD:  05/10/02 Job: [id number]

## 2011-04-12 NOTE — Op Note (Signed)
NAME:  Ernest [name], Ernest [name] NO.:  [id number]   MEDICAL RECORD NO.:  [id number]          PATIENT TYPE:  AMB   LOCATION:  DSC                          FACILITY:  [facility]   PHYSICIAN:  Artist Pais. Weingold, M.D.DATE OF BIRTH:  [birth year]   DATE OF PROCEDURE:  11/12/2006  DATE OF DISCHARGE:                               OPERATIVE REPORT   PREOPERATIVE DIAGNOSES:  1. Right wrist internal derangement.  2. Right wrist volar mass.   POSTOPERATIVE DIAGNOSES:  1. Right wrist internal derangement.  2. Right wrist volar mass.   PROCEDURE:  1. Right wrist arthroscopy with debridement of TFCC central tear and      scapholunate interosseous ligament tear.  2. As well as, through separate incision, volar mass removal.   SURGEON:  Weingold.   ASSISTANT:  None.   ANESTHESIA:  General.   TOURNIQUET TIME:  42 minutes.   No complications.   No drains.   No specimens sent.   OPERATIVE REPORT:  The patient was taken to the operating suite.  After  the induction of adequate general anesthesia, right upper extremity was  prepped and draped in usual sterile fashion.  An Esmarch was used to  exsanguinate the limb.  Tourniquet was then inflated to 250 mmHg.  At  this point in time, the patient's right upper extremity was padded and  placed in the wrist traction tower with 15 pounds of countertraction  across the radiocarpal joint, and a standard 3-4 arthroscopic portal was  established 1 cm distal to Lister's tubercle.  The skin was incised.  Blunt dissection was carried down through the subcutaneous tissues into  the joint.  The joint was visualized.  A 6U outflow portal was  established under direct vision using an 18-gauge needle, followed by a  4-5 working portal under direct vision.  There was a central TFCC tear,  as well as a scapholunate interosseous ligament tear.  Using the  instruments in the 4-5 portal, the TFCC tear was debrided using a 2.9  suction shaver and 2.3-mm  ArthroCare wand down to a stable rim.  At this  point in time, the instruments were placed, the scope in the 4-5 portal,  instruments in the 3-4 portal, and the scapholunate interosseous  ligament was debrided using again a 2.9 suction shaver and a 2.3-mm  ArthroCare wand.  After this was done, a combination of __________  Marcaine 1:1 was injected intraarticularly.  The portals were closed.  The patient's upper extremity on the right was then taken out of the  traction tower, placed __________  on the operating room table, and a  chevron incision was made over a palpable mass on the lower aspect of  the right wrist.  The flap was raised and radially based flap was  elevated.  Dissection was carried down in the interval between the  radial artery and the 1st dorsal compartment.  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  The wound was irrigated, hemostasis was achieved by bipolar cautery, and  it wound was closed with a 3-0 Prolene subcuticular stitch.  Steri-  Strips, 4 x 4's, Fluffs and a volar splint was applied.  The patient  tolerated the above procedures well and went to Recovery in stable  fashion.      Artist Pais Mina Marble, M.D.  Electronically Signed     MAW/MEDQ  D:  11/12/2006  T:  11/13/2006  Job:  [id number]

## 2011-04-12 NOTE — Procedures (Signed)
[facility]  Patient:    Ernest [name], HARGADON Visit Number: [id number] MRN: [id number]          Service Type: END Location: ENDO Attending Physician:  Orland Mustard Dictated by:   Llana Aliment. Randa Evens, M.D. Proc. Date: 04/13/02 Admit Date:  04/13/2002   CC:         Justine Null, M.D. [facility]  Zigmund Daniel, M.D.   Procedure Report  DATE OF BIRTH:  10-27-1956.  PROCEDURE:  Colonoscopy.  MEDICATIONS:  Fentanyl 150 mcg, Versed 12 mg IV.  SCOPE:  Olympus Pediatric video colonoscope.  INDICATION:  This is a nice gentleman I have been seeing for a number of years who has had adenomatous colon polyps and diverticular disease. He has had at least three of four episodes of diverticulitis that have been documented, most recently came in with left lower quadrant pain. I have treated him with Cipro and Flagyl. He was due for a followup colonoscopy for his previous colon polyps, and for this reason, we went ahead and did his colonoscopy now.  DESCRIPTION OF PROCEDURE:  The procedure had been explained to the patient and consent obtained. With the patient in the left lateral decubitus position, the Olympus Pediatric video colonoscope was inserted and advanced under direct visualization. There was marked diverticular disease in the sigmoid colon. After we were able to pass this, we were able to advance rapidly to the cecum. The ileocecal valve and appendiceal orifice were seen. The scope was withdrawn. The cecum, ascending colon, hepatic flexure, transverse colon, splenic flexure, descending and sigmoid colon were seen well; no polyps seen. Again, extensive diverticular disease. There were no polyps seen, no signs at all of active diverticulitis. The rectum was seen well and was free of polyps.  ASSESSMENT: 1. No evidence of recurrent colon polyps. 2. Continued diverticular disease problems.  PLAN:  Will recommend repeating colonoscopy in three  years. Will go ahead and continue to treat him with MiraLax and see him in the office in three to four weeks and will discuss with him further the surgical option. Dictated by:   Llana Aliment. Randa Evens, M.D. Attending Physician:  Orland Mustard DD:  04/13/02 TD:  04/14/02 Job: [id number]

## 2011-04-12 NOTE — Op Note (Signed)
NAME:  Ernest [name], Ernest [name] NO.:  [id number]   MEDICAL RECORD NO.:  [id number]                   PATIENT TYPE:  OIB   LOCATION:  2889                                 FACILITY:  [facility]   PHYSICIAN:  Stefani Dama, M.D.               DATE OF BIRTH:  [birth year]   DATE OF PROCEDURE:  10/10/2003  DATE OF DISCHARGE:                                 OPERATIVE REPORT   PREOPERATIVE DIAGNOSIS:  Herniated nucleus pulposus, L4-5, left, with left  lumbar radiculopathy.   POSTOPERATIVE DIAGNOSIS:  Herniated nucleus pulposus, L4-5, left, with left  lumbar radiculopathy.   OPERATION:  Left L4-5 Metrix diskectomy with operating microscope and  microdissection technique.   SURGEON:  Stefani Dama, M.D.   FIRST ASSISTANT:  Coletta Memos, M.D.   ANESTHESIA:  General endotracheal.   INDICATIONS:  The patient is a 55 year old, right-handed individual who has  had significant back and left lower extremity pain.  He has been treated  conservatively for approximately four to five weeks and having failed this.  He has evidence of increasing dorsiflexor weakness on the left lower  extremity.  MRI demonstrates the presence of a herniated nucleus pulposus  with a fragment in the left lateral recess.  He has been advised regarding  surgical decompression of this process.   DESCRIPTION OF PROCEDURE:  The patient was brought to the operating room  supine on the stretcher.  After smooth induction of general endotracheal  anesthesia, he was turned prone onto the operating table.  The bony  prominences were appropriately padded and protected.  The back was prepped  with Hibiclens solution and alcohol and draped in a sterile fashion.  Using  fluoroscopic localization, the L4-5 interspace was localized first in the PA  plane and then in the lateral plane.  An area of skin on the left side was  infiltrated with a total of 4 mL of lidocaine plus epinephrine.  A small  vertical  incision was created measuring 15 mm in its length.  Then a K-wire  was passed down to the laminar arch of L4 and using a winding technique a  series of dilators was passed over the K-wire to the 18 mm diameter size.  An 18 mm x 5 cm rigid cannula was then fixed into the patient's back and  clamped to the operating table.  Through this aperture then the microscope  was brought into the field and the soft tissues over this area were cleared  with monopolar cautery.  A series of curets, rongeurs, and a high-speed  drill were used to remove the inferior margin of the lamina of L4 at the  mesial wall facet.  The ligament was then removed with 2 and 3 mm Kerrison  punches.  The common dural tube was identified.  The takeoff of the L5 nerve  root was then identified.  Epidural veins were cauterized and divided using  microsurgical technique.  Then with further dissection, the L5 nerve root  was noted to be tented dorsally and medially.  It was retracted medially and  on the lateral aspect there was noted to be a thickened piece of tissue,  which when incised with a 15 blade was found to reveal a sizeable fragment  of disk material.  This was excised and allowed for immediate decompression  of the L5 nerve root in the region of the foramen.  The area was then  inspected more cephalad and it was noted that the subligamentous region was  contiguous with the disk space and several other fragments of disk were  removed from this region.  The disk space was entered and a series of disk  space rongeurs were used to clear both medially and laterally any free disk  material that was evident.  The inferior margin of the ligament was left to  overhang the disk space itself and this was allowed to be intact.  The  common dural tube and the L5 nerve root were well decompressed.  Hemostasis  in the soft tissues was obtained meticulously with bipolar cautery.  In the  end, the area was copiously irrigated with  antibiotic irrigating solution.  Good decompression of the L5 nerve root had been achieved visually with  inspection and by passing a probe out the foramen.  The space was then  infiltrated with 0.5 mL of fentanyl for a total of 25 mcg.  The rigid scope  was removed and the incision was inspected.  A singular 3-0 suture was  placed in the fascia and then 3-0 Vicryl was used in the subcuticular  tissues.  The patient tolerated the procedure well.  The blood loss was  estimated at less than 100 mL.                                               Stefani Dama, M.D.    Merla Riches  D:  10/10/2003  T:  10/10/2003  Job:  [id number]

## 2011-04-12 NOTE — Telephone Encounter (Signed)
Called, spoke with pt.  He states he is slightly better but "just not able to shake this."  C/o head and chest congestion, wheezing, chest tightness, increased SOB, prod cough with white mucus.  States he finished the zpak from 5/7, using the prometh codeine cough syrup q6h with some relief, and taking mucinex q4h.  Requesting something else be sent in .    nkda -- verified [facility]  Dr. Maple Hudson, pls advise. Thanks!

## 2011-04-12 NOTE — Assessment & Plan Note (Signed)
[facility]                                 ON-CALL NOTE   NAME:Ernest [name], Ernest [name]                   MRN:          [id number]  DATE:03/14/2007                            DOB:          1956-07-09    PRIMARY CARDIOLOGIST:  Dr. Samule Ohm.   Mr. Otting wife called in this afternoon as he ran out of his  Lopressor a couple of days ago and needs to have a refill prescription  called in to the [facility] at [phone].  I went ahead and left a  message for the pharmacist for Lopressor 50 mg 1 p.o. b.i.d. #60 with 6  refills.     Nicolasa Ducking, ANP  Electronically Signed    CB/MedQ  DD: 03/14/2007  DT: 03/14/2007  Job #: [id number]

## 2011-04-12 NOTE — Progress Notes (Signed)
[facility]                          PERIPHERAL VASCULAR OFFICE NOTE   NAME:Ernest [name]                 MRN:          [id number]  DATE:09/05/2006                            DOB:          04/11/1956   REFERRING PHYSICIAN:  Thomos Lemons, DO   REASON FOR CONSULTATION:  Claudication.   HISTORY OF PRESENT ILLNESS:  Ernest [name] is a 55 year old gentleman who  presents with a year or more of bilateral calf discomfort with exertion.  When he walks approximately 100 yards, his right calf aches; the left begins  to ache shortly thereafter.  He is not sure how far he can walk before he  must stop, but it seems on the order of 150 yards or so.  He has never had  any rest pain or tissue loss.  He has no similar discomfort in his buttocks  or thighs.   PAST MEDICAL HISTORY:  Remarkable for:  1. Hypertension.  2. COPD.  3. Depression.  4. Peptic ulcer disease 20 years ago.  5. Lumbar spinal surgery.  6. Surgery on his left hip.  7. A colon resection for diverticulitis.  8. A distant tonsillectomy.  9. Coronary disease with moderate LAD stenosis, managed medically.   ALLERGIES:  PLAVIX may cause a rash.   MEDICATIONS:  1. Aspirin 325 mg per day.  2. Amlodipine 5 mg per day.  3. Metoprolol 50 mg per day.  4. Cymbalta 60 mg per day.  5. Benicar HCT 40/25 mg one per day.  6. Crestor 10 mg per day.  7. Foredil 12 mg per day.  8. Pulmicort.  9. K-Dur 20 mEq per day.   SOCIAL HISTORY:  The patient works as a Psychologist, occupational.  He enjoys hunting and  fishing.  He is married.  He had a 29 year old son who died in a car  accident several years ago.  No other children.  He smokes a half a pack of  cigarettes per day.  Denies alcohol and illicit drug use.   FAMILY HISTORY:  Father died at 58 after a noncardiac surgery.  Mother died  at 35 of myocardial infarction.  Sister is alive at 65, having had a  myocardial infarction at age 6 and has emphysema.  A  31 year old brother is  alive and well.   REVIEW OF SYSTEMS:  Remarkable for occasional headaches and dizziness.  He  wears glasses.  Has occasional tinnitus.  Has upper dentures.  He describes  occasional exertional dyspnea, wheezing and constipation.  Has chronic left  shoulder discomfort and allergic rhinitis.  Review of systems is otherwise  negative in detail, except as above.   PHYSICAL EXAMINATION:  He is generally well-appearing, in no distress with  heart rate 80, blood pressure 126/82 on the right and 128/84 on the left.  He is 5-feet 7-inches tall and weighs 194 pounds.  HEENT:  Normal.  SKIN:  Normal.  He has no jugular venous distention, thyromegaly, or lymphadenopathy.  Respiratory effort is normal.  Lungs are clear to auscultation, though  expiratory phase is moderately prolonged.  There are no wheezes, rales, or  rhonchi.  The  point of maximal cardiac impulse is not displaced.  There is a regular  rate and rhythm without murmur, rub, or gallop.  ABDOMEN:  Soft, non-distended and nontender.  There is no  hepatosplenomegaly.  Bowel sounds are normal.  EXTREMITIES:  Warm without clubbing, cyanosis, edema, or ulceration.  Carotid pulses are 2+ bilaterally without bruit.  Femoral pulses 2+  bilaterally with a soft bruit on the left.  Popliteal pulse is absent  bilaterally.  DP and PT pulses not palpable on the right and are 1+ on the  left.  MUSCULOSKELETAL:  Exam is normal.  He is alert and oriented x3 with normal affect and normal neurologic exam.   STUDIES:  1. ABIs performed today demonstrate ABI of 0.83 on the right and 0.99 on      the left.  The right mid SFA has a severe stenosis over 3 cm in its      midsection.  On the left, there is turbulence within the common femoral      artery with a moderate stenosis extending into the ostium of the SFA.      No other significant stenoses.  2. Carotid duplex also performed today demonstrates 0% to 39% ICA stenosis       bilaterally.  3. Abdominal ultrasound performed August 29, 2006 showed no evidence of      abdominal aortic aneurysm.   IMPRESSION/RECOMMENDATION:  Ernest [name] has mildly lifestyle-limiting  claudication of the right leg and some discomfort in the left leg.  The  right leg has a superficial femoral artery stenosis in its midsection.  The  left leg is only slightly less symptomatic than the right, however.  We will  attempt conservative therapy with emphasis on smoking cessation, exercise,  and Pletal.  He does seem interested in stopping smoking.  I have given him  a prescription for Chantix and also prescribed Pletal.  I have recommended  to him a structured exercise program, which I have  reviewed in detail.  He does seem interested in participating.  I will plan  on seeing him back in 3 months' time.       Ernest Farber, MD   WED/MedQ DD:  09/08/2006 DT:  09/09/2006 Job #:  [id number]   cc:   Ernest [name]. Ernest Pais, DO

## 2011-04-24 ENCOUNTER — Other Ambulatory Visit: Payer: Self-pay | Admitting: *Deleted

## 2011-04-24 ENCOUNTER — Other Ambulatory Visit (INDEPENDENT_AMBULATORY_CARE_PROVIDER_SITE_OTHER): Payer: Medicare PPO | Admitting: *Deleted

## 2011-04-24 ENCOUNTER — Encounter (INDEPENDENT_AMBULATORY_CARE_PROVIDER_SITE_OTHER): Payer: Medicare PPO | Admitting: *Deleted

## 2011-04-24 ENCOUNTER — Encounter: Payer: Self-pay | Admitting: *Deleted

## 2011-04-24 DIAGNOSIS — I739 Peripheral vascular disease, unspecified: Secondary | ICD-10-CM

## 2011-04-24 DIAGNOSIS — E7800 Pure hypercholesterolemia, unspecified: Secondary | ICD-10-CM

## 2011-04-24 LAB — LIPID PANEL
LDL Cholesterol: 79 mg/dL (ref 0–99)
Total CHOL/HDL Ratio: 3

## 2011-04-25 ENCOUNTER — Encounter: Payer: Self-pay | Admitting: *Deleted

## 2011-04-26 ENCOUNTER — Encounter: Payer: Self-pay | Admitting: Cardiology

## 2011-05-02 ENCOUNTER — Telehealth: Payer: Self-pay | Admitting: Internal Medicine

## 2011-05-02 MED ORDER — PREDNISONE 10 MG PO TABS: ORAL_TABLET | ORAL | Status: DC

## 2011-05-02 MED ORDER — AZITHROMYCIN 250 MG PO TABS: ORAL_TABLET | ORAL | Status: DC

## 2011-05-02 NOTE — Telephone Encounter (Signed)
Per CY-okay to give Zpak #1 take as directed no refills and Prednisone 10 mg #20 take 4 x 2 days, 3 x 2 days, 2 x 2 days, 1 x  2 days then stop no refills.

## 2011-05-02 NOTE — Telephone Encounter (Signed)
Called, spoke with pt's wife.  She is aware of CDY's recs and will inform pt.  [facility] -- wife aware and advised to have pt call back if he does not improve or worsens.  She verbalized understanding of this.

## 2011-05-02 NOTE — Telephone Encounter (Signed)
Called spoke with patient who reports that his bronchitis has improved but completely gone > chest congestion, prod cough with white mucus, wheezing, SOB.  Pt was given zpak and pred taper at 5.7.12 ov with CDY, then promethazine-codeine syrup on 5.8.12 and a round of cefdinir on 5.18.12.  Pt reports still taking mucinex.  CDY there are no openings on your schedule today or tomorrow.  Please advise if patient needs to come in or if he may have treatment over the phone - pt is fine with either.  [facility].  NKDA.

## 2011-05-14 ENCOUNTER — Telehealth: Payer: Self-pay | Admitting: Internal Medicine

## 2011-05-14 MED ORDER — FIRST-DUKES MOUTHWASH MT SUSP: 5.0000 mL | Freq: Three times a day (TID) | OROMUCOSAL | Status: DC

## 2011-05-14 NOTE — Telephone Encounter (Signed)
Per [facility] Magic Mouthwash # 150 ml swish and swallow 1 tsp tid no refills.

## 2011-05-14 NOTE — Telephone Encounter (Signed)
rx sent. Pt aware.Jennifer Castillo, CMA  

## 2011-05-14 NOTE — Telephone Encounter (Signed)
Spoke with pt. He is c/o mouth feeling "raw" x 3 days. States that he does rinse mouth well after use of inhaled meds. Would like rx for thrush. Please advise, thanks!

## 2011-06-03 ENCOUNTER — Other Ambulatory Visit: Payer: Self-pay | Admitting: Internal Medicine

## 2011-06-26 ENCOUNTER — Other Ambulatory Visit: Payer: Self-pay | Admitting: *Deleted

## 2011-06-26 MED ORDER — ATORVASTATIN CALCIUM 20 MG PO TABS: 20.0000 mg | ORAL_TABLET | Freq: Every day | ORAL | Status: DC

## 2011-07-03 ENCOUNTER — Other Ambulatory Visit: Payer: Self-pay | Admitting: Internal Medicine

## 2011-07-03 MED ORDER — FORMOTEROL FUMARATE 12 MCG IN CAPS: 12.0000 ug | ORAL_CAPSULE | Freq: Two times a day (BID) | RESPIRATORY_TRACT | Status: DC

## 2011-07-03 NOTE — Telephone Encounter (Signed)
Received refill request for foradil.  Last seen 5.7.12 by CDY, told to follow up in 3months > 8.14.12.  Refills sent to pharmacy, med list updated.

## 2011-07-09 ENCOUNTER — Ambulatory Visit: Payer: Medicare PPO | Admitting: Internal Medicine

## 2011-07-18 ENCOUNTER — Other Ambulatory Visit (INDEPENDENT_AMBULATORY_CARE_PROVIDER_SITE_OTHER): Payer: Medicare PPO

## 2011-07-18 DIAGNOSIS — I10 Essential (primary) hypertension: Secondary | ICD-10-CM

## 2011-07-18 DIAGNOSIS — Z0000 Encounter for general adult medical examination without abnormal findings: Secondary | ICD-10-CM

## 2011-07-18 DIAGNOSIS — Z79899 Other long term (current) drug therapy: Secondary | ICD-10-CM

## 2011-07-18 DIAGNOSIS — F329 Major depressive disorder, single episode, unspecified: Secondary | ICD-10-CM

## 2011-07-18 DIAGNOSIS — Z125 Encounter for screening for malignant neoplasm of prostate: Secondary | ICD-10-CM

## 2011-07-18 LAB — BASIC METABOLIC PANEL
CO2: 28 mEq/L (ref 19–32)
Chloride: 101 mEq/L (ref 96–112)
Glucose, Bld: 117 mg/dL — ABNORMAL HIGH (ref 70–99)
Potassium: 4.1 mEq/L (ref 3.5–5.1)
Sodium: 139 mEq/L (ref 135–145)

## 2011-07-18 LAB — HEPATIC FUNCTION PANEL
ALT: 29 U/L (ref 0–53)
Alkaline Phosphatase: 123 U/L — ABNORMAL HIGH (ref 39–117)
Bilirubin, Direct: 0.2 mg/dL (ref 0.0–0.3)
Total Bilirubin: 0.5 mg/dL (ref 0.3–1.2)
Total Protein: 6.3 g/dL (ref 6.0–8.3)

## 2011-07-18 LAB — CBC WITH DIFFERENTIAL/PLATELET
Basophils Absolute: 0 10*3/uL (ref 0.0–0.1)
Eosinophils Absolute: 0.4 10*3/uL (ref 0.0–0.7)
HCT: 47.7 % (ref 39.0–52.0)
Hemoglobin: 15.9 g/dL (ref 13.0–17.0)
Lymphs Abs: 2.5 10*3/uL (ref 0.7–4.0)
MCHC: 33.3 g/dL (ref 30.0–36.0)
Neutro Abs: 10.3 10*3/uL — ABNORMAL HIGH (ref 1.4–7.7)
RDW: 14.5 % (ref 11.5–14.6)

## 2011-07-18 LAB — TSH: TSH: 2.48 u[IU]/mL (ref 0.35–5.50)

## 2011-07-18 LAB — POCT URINALYSIS DIPSTICK
Blood, UA: NEGATIVE
Protein, UA: NEGATIVE
Spec Grav, UA: 1.025
Urobilinogen, UA: 0.2
pH, UA: 5

## 2011-07-18 LAB — LIPID PANEL: VLDL: 63.6 mg/dL — ABNORMAL HIGH (ref 0.0–40.0)

## 2011-07-26 ENCOUNTER — Encounter: Payer: Medicare PPO | Admitting: Internal Medicine

## 2011-07-30 ENCOUNTER — Ambulatory Visit (INDEPENDENT_AMBULATORY_CARE_PROVIDER_SITE_OTHER): Payer: Medicare PPO | Admitting: Internal Medicine

## 2011-07-30 ENCOUNTER — Encounter: Payer: Self-pay | Admitting: Internal Medicine

## 2011-07-30 VITALS — BP 130/68 | HR 74 | Ht 67.0 in | Wt 206.0 lb

## 2011-07-30 DIAGNOSIS — J449 Chronic obstructive pulmonary disease, unspecified: Secondary | ICD-10-CM

## 2011-07-30 DIAGNOSIS — Z23 Encounter for immunization: Secondary | ICD-10-CM

## 2011-07-30 DIAGNOSIS — G4733 Obstructive sleep apnea (adult) (pediatric): Secondary | ICD-10-CM

## 2011-07-30 DIAGNOSIS — J984 Other disorders of lung: Secondary | ICD-10-CM

## 2011-07-30 MED ORDER — PREDNISONE (PAK) 10 MG PO TABS: 10.0000 mg | ORAL_TABLET | Freq: Every day | ORAL | Status: AC

## 2011-07-30 NOTE — Assessment & Plan Note (Addendum)
Severe COPD with early cor pulmonale. I will get home assessment of oxygen saturation.  Flu vax Tobacco cessation Sister also has severe COPD. I will check alpha 1 antitrypsin status

## 2011-07-30 NOTE — Patient Instructions (Signed)
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

## 2011-07-30 NOTE — Progress Notes (Signed)
Subjective:    Patient ID: Ernest [name], male    DOB: 30-Aug-1956, 55 y.o.   MRN: [id number]  HPI 07/30/11- 9 yoM smoker followed for COPD/ bronchitis, OSA/ CPAP Last here 04/01/11- note reviewed. He had felt better on a trial of maintenance prednisone. He has felt more short of breath through the hot summer, with no acute event and without blood, chest pain or fever. Easy DOE aroung home. Has been sharing nebulizer with his wife, who now has a cold and we discussed this. Has been worse in last month, and expecially in recent rainy weather. Noting a little ankle swelling. Smothered trying to take a shower.  Only tolerating his CPAP about 3 nights/ week because he feels smothered, waking sore in anterior chest in the mornings.  Now down to only 2-3 cigarettes/ day and trying to stop.  CXR 08/14/10- Nl NAD PFT 05/26/08- FEV1/FVC 2.44/ 0.76, incr 19% w/ BD, R 0.59 ?DLCO 0.69   Review of Systems Constitutional:   No-   weight loss, night sweats, fevers, chills, fatigue, lassitude. HEENT:   No-  headaches, difficulty swallowing, tooth/dental problems, sore throat,       No-  sneezing, itching, ear ache, nasal congestion, post nasal drip,  CV:  +  chest pain, orthopnea, PND,  +swelling in lower extremities,  dizziness, palpitations Resp: No-   shortness of breath with exertion or at rest.              +  productive cough,  + non-productive cough,  No-  coughing up of blood.              No-   change in color of mucus.  +some wheezing.   Skin: No-   rash or lesions. GI:  No-   heartburn, indigestion, abdominal pain, nausea, vomiting, diarrhea,                 change in bowel habits, loss of appetite GU: No-   dysuria, change in color of urine, no urgency or frequency.  No- flank pain. MS:  No-   joint pain or swelling.  No- decreased range of motion.  No- back pain. Neuro- grossly normal to observation, Or:  Psych:  No- change in mood or affect. No depression or anxiety.  No memory loss.     Objective:   Physical Exam General- Alert, Oriented, Affect-appropriate, Distress- none acute   Stocky/ overweight Skin- rash-none, lesions- none, excoriation- none    tanned Lymphadenopathy- none Head- atraumatic            Eyes- Gross vision intact, PERRLA, conjunctivae clear secretions            Ears- Hearing, canals- normal            Nose- Clear, No- Septal dev, mucus, polyps, erosion, perforation             Throat- Mallampati IIII , mucosa clear , drainage- none, tonsils- atrophic Neck- flexible , trachea midline, no stridor , thyroid nl, carotid no bruit Chest - symmetrical excursion , unlabored           Heart/CV- RRR , no murmur , no gallop  , no rub, nl s1 s2                           - JVD- none , edema- trace, stasis changes- none, varices- none           Lung-  clear to P&A- distant, wheeze- none, cough- none , dullness-none, rub- none           Chest wall-  Abd- tender-no, distended-no, bowel sounds-present, HSM- no Br/ Gen/ Rectal- Not done, not indicated Extrem- cyanosis- none, clubbing, none, atrophy- none, strength- nl Neuro- grossly intact to observation         Assessment & Plan:

## 2011-07-30 NOTE — Assessment & Plan Note (Addendum)
Only able to wear it 2-3 nights/ week recently due to dyspnea. Wakes up in AM and lungs are hurting.  Plan- reduce pressure continue to emphasize smoking cessation;

## 2011-07-31 ENCOUNTER — Encounter: Payer: Self-pay | Admitting: Internal Medicine

## 2011-07-31 ENCOUNTER — Ambulatory Visit (INDEPENDENT_AMBULATORY_CARE_PROVIDER_SITE_OTHER): Payer: Medicare PPO | Admitting: Internal Medicine

## 2011-07-31 DIAGNOSIS — R7309 Other abnormal glucose: Secondary | ICD-10-CM

## 2011-07-31 DIAGNOSIS — N419 Inflammatory disease of prostate, unspecified: Secondary | ICD-10-CM

## 2011-07-31 DIAGNOSIS — J4489 Other specified chronic obstructive pulmonary disease: Secondary | ICD-10-CM

## 2011-07-31 DIAGNOSIS — Z0000 Encounter for general adult medical examination without abnormal findings: Secondary | ICD-10-CM

## 2011-07-31 DIAGNOSIS — F329 Major depressive disorder, single episode, unspecified: Secondary | ICD-10-CM

## 2011-07-31 DIAGNOSIS — R739 Hyperglycemia, unspecified: Secondary | ICD-10-CM

## 2011-07-31 DIAGNOSIS — I10 Essential (primary) hypertension: Secondary | ICD-10-CM

## 2011-07-31 DIAGNOSIS — J449 Chronic obstructive pulmonary disease, unspecified: Secondary | ICD-10-CM

## 2011-07-31 DIAGNOSIS — F3289 Other specified depressive episodes: Secondary | ICD-10-CM

## 2011-07-31 MED ORDER — PANTOPRAZOLE SODIUM 40 MG PO TBEC: 40.0000 mg | DELAYED_RELEASE_TABLET | Freq: Every day | ORAL | Status: DC

## 2011-07-31 MED ORDER — CIPROFLOXACIN HCL 500 MG PO TABS: 500.0000 mg | ORAL_TABLET | Freq: Two times a day (BID) | ORAL | Status: AC

## 2011-07-31 NOTE — Patient Instructions (Signed)
Avoid sweets and sugary beverages Decrease your intake of carbohydrates

## 2011-08-01 ENCOUNTER — Encounter: Payer: Self-pay | Admitting: Internal Medicine

## 2011-08-01 DIAGNOSIS — N419 Inflammatory disease of prostate, unspecified: Secondary | ICD-10-CM | POA: Insufficient documentation

## 2011-08-01 DIAGNOSIS — Z0000 Encounter for general adult medical examination without abnormal findings: Secondary | ICD-10-CM | POA: Insufficient documentation

## 2011-08-01 DIAGNOSIS — R739 Hyperglycemia, unspecified: Secondary | ICD-10-CM | POA: Insufficient documentation

## 2011-08-01 NOTE — Assessment & Plan Note (Signed)
Well controlled.  Continue current medication regimen.  BP: 124/74 mmHg  Lab Results  Component Value Date   CREATININE 0.9 07/18/2011

## 2011-08-01 NOTE — Assessment & Plan Note (Addendum)
Severe COPD with asthmatic component.  Pt reports wheezing worse in AM.  Question symptoms exacerbated by GERD.   Pt advised to use protonix before dinner and raise head of bed 8- 10 inches.  Complete smoking cessation strongly encouranged.

## 2011-08-01 NOTE — Assessment & Plan Note (Signed)
Treat with Cipro 500 mg bid x 10 days.  If symptoms recur, we discussed resuming abx for total tx of 3 weeks.

## 2011-08-01 NOTE — Progress Notes (Signed)
Subjective:    Patient ID: Ernest [name], male    DOB: 13-Aug-1956, 55 y.o.   MRN: [id number]  HPI  55 y/o male with chronic COPD, Htn, CAD, PVD, depression requests annual physical.  Health maintenance check list reviewed with pt.   He has been able to cut down to 6 cigarettes but not able to completely quit.  He continues to experience wheezing and chest tightness esp in AM despite use of his inhalers.  Pt recently seen by Dr. Maple Hudson.  He is on daily prednisone.  He notes increased weight / appetite.   All lab results reviewed with pt.  Blood sugar was slightly elevated.  Hyperlipidemia - FLP reviewed with pt  Int hx:  He reports cardiology eval.  Stress testing and ABI reported stable.  Pt complains of generally not feeling well.  He notes nocturia x 3-4.  This is unusual for him.  He also has difficulty starting urinary stream.  Review of Systems  Negative for chest pain,  Positive wheezing esp in AM.  Past Medical History  Diagnosis Date  . CAD (coronary artery disease)     Left Main 30% stenosis, LAD 20 - 30 % stenosis, first and second diagonal branchesat 40 - 50%  stenosis with small arteries, circumflex had 30% stenosis in the large obtuse marginal, RCA at 70 - 80%  stenosis [not felt to be occlusive after evaluation with flow wire], distal 50 - 60% stenosis - [name][  . COPD (chronic obstructive pulmonary disease)     Dr. Jetty Duhamel  . Depression   . Anxiety   . Hyperlipidemia   . Chronic insomnia   . OSA (obstructive sleep apnea)     NPSG 09/10/10- AHI 11.3/hr  . Gout   . GERD (gastroesophageal reflux disease)   . PVD (peripheral vascular disease)     (ABI 0.9 on right and 0.89 on left)  severe left external iliac stenosis.  He  had successful stenting of his left external iliac per Dr. Excell Seltzer.  . DDD (degenerative disc disease)   . Hx of colonoscopy     History   Social History  . Marital Status: Married    Spouse Name: N/A    Number of  Children: N/A  . Years of Education: N/A   Occupational History  . Disabled welder    Social History Main Topics  . Smoking status: Current Everyday Smoker -- 0.2 packs/day for 43 years    Types: Cigarettes  . Smokeless tobacco: Never Used   Comment: On Chantix.  Quit date 04/12/11 smokes 5 cigarettes daily  . Alcohol Use: No  . Drug Use: No  . Sexually Active: No   Other Topics Concern  . Not on file   Social History Narrative  . No narrative on file    Past Surgical History  Procedure Date  . Spinal fusion 03/05/2007    L4-L5  . Hip surgery     left  . Arm surgery     left  . Shoulder surgery     right  . C-spine surgery     Family History  Problem Relation Age of Onset  . Heart attack Mother   . Heart attack Sister   . Emphysema Sister     No Known Allergies  Current Outpatient Prescriptions on File Prior to Visit  Medication Sig Dispense Refill  . albuterol (PROVENTIL) (2.5 MG/3ML) 0.083% nebulizer solution Take 3 mLs (2.5 mg total) by nebulization every 4 (four) hours  as needed for wheezing or shortness of breath.  25 vial  prn  . amLODipine (NORVASC) 10 MG tablet TAKE 1 TABLET BY MOUTH DAILY  30 tablet  7  . atorvastatin (LIPITOR) 20 MG tablet Take 1 tablet (20 mg total) by mouth daily.  30 tablet  6  . azithromycin (ZITHROMAX) 250 MG tablet Take 2 tablets by mouth on day 1, followed by 1 tablet by mouth daily for 4 days. Take as directed  6 each  0  . budesonide (PULMICORT FLEXHALER) 180 MCG/ACT inhaler Inhale 1 puff into the lungs 2 (two) times daily.        . carvedilol (COREG) 25 MG tablet Take 25 mg by mouth 2 (two) times daily with a meal.        . CHANTIX CONTINUING MONTH PAK 1 MG tablet TAKE AS DIRECTED  28 tablet  0  . clonazePAM (KLONOPIN) 1 MG tablet as needed.        . cyclobenzaprine (FLEXERIL) 10 MG tablet as needed.        . Diphenhyd-Hydrocort-Nystatin (FIRST-DUKES MOUTHWASH) SUSP Use as directed 5 mLs in the mouth or throat 3 (three) times  daily.  150 mL  0  . FLUoxetine (PROZAC) 40 MG capsule Take 40 mg by mouth daily.        . formoterol (FORADIL AEROLIZER) 12 MCG capsule for inhaler Place 1 capsule (12 mcg total) into inhaler and inhale 2 (two) times daily.  60 capsule  5  . isosorbide mononitrate (IMDUR) 30 MG 24 hr tablet Take 30 mg by mouth daily.        . mirtazapine (REMERON) 45 MG tablet Take 45 mg by mouth at bedtime.        . modafinil (PROVIGIL) 200 MG tablet Take 200 mg by mouth daily.        [name] morphine (MS CONTIN) 60 MG 12 hr tablet Take 60 mg by mouth 2 (two) times daily.        . Nebulizers (COMPRESSOR/NEBULIZER) MISC 1 Device by Does not apply route as directed.  1 each  0  . Omega-3 Fatty Acids (FISH OIL CONCENTRATE) 1000 MG CAPS Take 1 capsule by mouth 3 (three) times daily.        . predniSONE (STERAPRED UNI-PAK) 10 MG tablet Take 1 tablet (10 mg total) by mouth daily.  30 tablet  3    BP 124/74  Pulse 72  Temp(Src) 98.2 F (36.8 C) (Oral)  Ht 5\' 6"  (1.676 m)  Wt 205 lb (92.987 kg)  BMI 33.09 kg/m2       Objective:   Physical Exam   Constitutional: Appears well-developed and well-nourished. No distress.  Head: Normocephalic and atraumatic.  Right Ear: External ear normal.  Left Ear: External ear normal.  Mouth/Throat: Oropharynx is clear and moist.  Eyes: Conjunctivae slightly red bilaterally. Pupils are equal, round, and reactive to light.  Neck: Normal range of motion. Neck supple. No thyromegaly present. No carotid bruit Cardiovascular: Normal rate, regular rhythm and normal heart sounds.  Exam reveals no gallop and no friction rub.   No murmur heard. Pulmonary/Chest: Effort normal.  Bilateral scattered exp wheezing.  No rhonchi Abdominal: Soft. Bowel sounds are normal. No mass. There is no tenderness.   No hernias GU:  Uncircumcised,  No testicular masses,  No hernia Rectal: no rectal mass,  Prostate slightly enlarged and boggy.  No asymmetry or nodules Neurological: Alert. No cranial  nerve deficit.  Skin: Skin is warm and dry.  Psychiatric: Normal mood and affect. Behavior is normal.      Assessment & Plan:

## 2011-08-01 NOTE — Assessment & Plan Note (Signed)
Pt reports generalized fatigue.  Symptoms may be side effect of remeron 45 mg.  Trial of 1/2 dose.

## 2011-08-01 NOTE — Assessment & Plan Note (Signed)
Hyperglycemia likely exacerbated by prednisone use.   Pt advised to avoid sweets and sugary beverages. Decrease intake of carbohydrates. If persistent hyperglycemia, add A1c to next lab draw.

## 2011-08-01 NOTE — Assessment & Plan Note (Signed)
Reviewed adult health maintenance protocols including adult vacinnations and colonoscopy.   PSA normal .   Lab Results  Component Value Date   PSA 0.64 07/18/2011

## 2011-08-14 ENCOUNTER — Ambulatory Visit (INDEPENDENT_AMBULATORY_CARE_PROVIDER_SITE_OTHER): Payer: Medicare PPO | Admitting: Internal Medicine

## 2011-08-14 ENCOUNTER — Encounter: Payer: Self-pay | Admitting: Internal Medicine

## 2011-08-14 DIAGNOSIS — N419 Inflammatory disease of prostate, unspecified: Secondary | ICD-10-CM

## 2011-08-14 DIAGNOSIS — J209 Acute bronchitis, unspecified: Secondary | ICD-10-CM

## 2011-08-14 DIAGNOSIS — F3289 Other specified depressive episodes: Secondary | ICD-10-CM

## 2011-08-14 DIAGNOSIS — R232 Flushing: Secondary | ICD-10-CM

## 2011-08-14 DIAGNOSIS — F329 Major depressive disorder, single episode, unspecified: Secondary | ICD-10-CM

## 2011-08-14 LAB — POCT URINALYSIS DIPSTICK
Bilirubin, UA: NEGATIVE
Blood, UA: NEGATIVE
Ketones, UA: NEGATIVE
Spec Grav, UA: 1.01
pH, UA: 7

## 2011-08-14 MED ORDER — TAMSULOSIN HCL 0.4 MG PO CAPS: 0.4000 mg | ORAL_CAPSULE | ORAL | Status: DC

## 2011-08-14 MED ORDER — LEVOFLOXACIN 500 MG PO TABS: 500.0000 mg | ORAL_TABLET | Freq: Every day | ORAL | Status: AC

## 2011-08-14 NOTE — Assessment & Plan Note (Signed)
Dysuria symptoms have improved but are still persistent. Frequency has improved. He is having dribbling and difficulty stopping or starting his urine consistent with BPH. Treat with Levaquin 500 mg daily x3 weeks Add tamsulosin at bedtime

## 2011-08-14 NOTE — Patient Instructions (Signed)
Please complete the following lab tests before your next follow up appointment: Testosterone, free testosterone - 627.2

## 2011-08-14 NOTE — Progress Notes (Signed)
Subjective:    Patient ID: Ernest [name], male    DOB: 04/12/1956, 55 y.o.   MRN: [id number]  HPI 55 year old white male previously seen for possible prostatitis for followup. Patient finished 10 days of Cipro twice a day. Patient reports dysuria symptoms improved but not completely resolved. Patient complains of sense of incomplete bladder and being. He has difficulty starting and stopping his stream.  He also complained of chronic fatigue which she did she read it to one of his medications. Pt told to take 1/2 of remeron dose .  His fatigue has improved  Patient also complains of intermittent hot flashes. He has associated low libido and erectile dysfunction. He has difficulty attaining and maintaining an erection.  COPD-he complains of increased cough and sputum production. His wife has been fighting an upper respiratory infection.  He has been using his Pulmicort and rescue nebulizer.  He denies SOB Review of Systems Negative for fever or back pain    Past Medical History  Diagnosis Date  . CAD (coronary artery disease)     Left Main 30% stenosis, LAD 20 - 30 % stenosis, first and second diagonal branchesat 40 - 50%  stenosis with small arteries, circumflex had 30% stenosis in the large obtuse marginal, RCA at 70 - 80%  stenosis [not felt to be occlusive after evaluation with flow wire], distal 50 - 60% stenosis - [name][  . COPD (chronic obstructive pulmonary disease)     Dr. Jetty Duhamel  . Depression   . Anxiety   . Hyperlipidemia   . Chronic insomnia   . OSA (obstructive sleep apnea)     NPSG 09/10/10- AHI 11.3/hr  . Gout   . GERD (gastroesophageal reflux disease)   . PVD (peripheral vascular disease)     (ABI 0.9 on right and 0.89 on left)  severe left external iliac stenosis.  He  had successful stenting of his left external iliac per Dr. Excell Seltzer.  . DDD (degenerative disc disease)   . Hx of colonoscopy     History   Social History  . Marital Status:  Married    Spouse Name: N/A    Number of Children: N/A  . Years of Education: N/A   Occupational History  . Disabled welder    Social History Main Topics  . Smoking status: Current Everyday Smoker -- 0.2 packs/day for 43 years    Types: Cigarettes  . Smokeless tobacco: Never Used   Comment: On Chantix.  Quit date 04/12/11 smokes 3 cigarettes daily  . Alcohol Use: No  . Drug Use: No  . Sexually Active: No   Other Topics Concern  . Not on file   Social History Narrative  . No narrative on file    Past Surgical History  Procedure Date  . Spinal fusion 03/05/2007    L4-L5  . Hip surgery     left  . Arm surgery     left  . Shoulder surgery     right  . C-spine surgery     Family History  Problem Relation Age of Onset  . Heart attack Mother   . Heart attack Sister   . Emphysema Sister     No Known Allergies  Current Outpatient Prescriptions on File Prior to Visit  Medication Sig Dispense Refill  . albuterol (PROVENTIL) (2.5 MG/3ML) 0.083% nebulizer solution Take 3 mLs (2.5 mg total) by nebulization every 4 (four) hours as needed for wheezing or shortness of breath.  25 vial  prn  . amLODipine (NORVASC) 10 MG tablet TAKE 1 TABLET BY MOUTH DAILY  30 tablet  7  . atorvastatin (LIPITOR) 20 MG tablet Take 1 tablet (20 mg total) by mouth daily.  30 tablet  6  . budesonide (PULMICORT FLEXHALER) 180 MCG/ACT inhaler Inhale 1 puff into the lungs 2 (two) times daily.        . carvedilol (COREG) 25 MG tablet Take 25 mg by mouth 2 (two) times daily with a meal.        . CHANTIX CONTINUING MONTH PAK 1 MG tablet TAKE AS DIRECTED  28 tablet  0  . clonazePAM (KLONOPIN) 1 MG tablet as needed.        . cyclobenzaprine (FLEXERIL) 10 MG tablet as needed.        [name] FLUoxetine (PROZAC) 40 MG capsule Take 40 mg by mouth daily.        . formoterol (FORADIL AEROLIZER) 12 MCG capsule for inhaler Place 1 capsule (12 mcg total) into inhaler and inhale 2 (two) times daily.  60 capsule  5  .  isosorbide mononitrate (IMDUR) 30 MG 24 hr tablet Take 30 mg by mouth daily.        . mirtazapine (REMERON) 45 MG tablet Take 1/2 tablet at bedtime      . modafinil (PROVIGIL) 200 MG tablet Take 200 mg by mouth daily.        [name] morphine (MS CONTIN) 60 MG 12 hr tablet Take 60 mg by mouth 2 (two) times daily.        . Nebulizers (COMPRESSOR/NEBULIZER) MISC 1 Device by Does not apply route as directed.  1 each  0  . Omega-3 Fatty Acids (FISH OIL CONCENTRATE) 1000 MG CAPS Take 1 capsule by mouth 3 (three) times daily.        . pantoprazole (PROTONIX) 40 MG tablet Take 1 tablet (40 mg total) by mouth daily.  30 tablet  2    BP 132/68  Pulse 80  Temp(Src) 98.4 F (36.9 C) (Oral)  Ht 5\' 7"  (1.702 m)  Wt 207 lb (93.895 kg)  BMI 32.42 kg/m2    Objective:   Physical Exam   Constitutional: Appears well-developed and well-nourished. No distress.  Head: Normocephalic and atraumatic.  Right Ear: External ear normal.  Left Ear: External ear normal.  Mouth/Throat: Oropharynx is clear and moist.  Eyes: Conjunctivae are normal. Pupils are equal, round, and reactive to light.  Neck: Normal range of motion. Neck supple. No thyromegaly present. No carotid bruit Cardiovascular: Normal rate, regular rhythm and normal heart sounds.  Exam reveals no gallop and no friction rub.   No murmur heard. Pulmonary/Chest: Effort normal, scattered expiratory wheezing  Abdominal: Soft. Bowel sounds are normal. No mass. There is no tenderness.  Neurological: Alert. No cranial nerve deficit.  Skin: Skin is warm and dry.  Psychiatric: Normal mood and affect. Behavior is normal.       Assessment & Plan:

## 2011-08-14 NOTE — Assessment & Plan Note (Signed)
Patient reports symptoms of fatigue improved since taking a lower dose of Remeron.

## 2011-08-14 NOTE — Assessment & Plan Note (Addendum)
56 year old male complains of intermittent hot flashes. He also has symptoms of hypogonadism including low sex drive and erectile dysfunction. Check total and free testosterone  before next office visit.

## 2011-08-14 NOTE — Assessment & Plan Note (Signed)
Wife has been fighting an upper respiratory infection. Patient has noticed increased cough and sputum production. Levaquin for prostatitis should also help his bronchitis.  Continue Pulmicort and rescue nebulizer. If symptoms do not improve by Friday, we discussed using short course of prednisone.

## 2011-08-16 LAB — URINE CULTURE
Colony Count: NO GROWTH
Organism ID, Bacteria: NO GROWTH

## 2011-08-22 ENCOUNTER — Encounter: Payer: Self-pay | Admitting: Internal Medicine

## 2011-09-02 ENCOUNTER — Telehealth: Payer: Self-pay | Admitting: Internal Medicine

## 2011-09-02 MED ORDER — ALBUTEROL SULFATE HFA 108 (90 BASE) MCG/ACT IN AERS: 2.0000 | INHALATION_SPRAY | Freq: Four times a day (QID) | RESPIRATORY_TRACT | Status: DC | PRN

## 2011-09-02 NOTE — Telephone Encounter (Signed)
Pt's spouse advised and rx sent to pharmacy. Julaine Hua, CMA

## 2011-09-02 NOTE — Telephone Encounter (Signed)
Per [facility] for Ventolin/albuterol HFA #1 2 puffs  Qid prn with prn refills.

## 2011-09-02 NOTE — Telephone Encounter (Signed)
I spoke with pt wife and she states she would like for pt to have a ventolin on hand for emergencies. She states he does not have a rescue inhaler but does have an albuterol nebulizer. Please advise Dr. Maple Hudson if okay to send in rx for pt ventolin. Thanks  Carver Fila, CMA

## 2011-09-02 NOTE — Telephone Encounter (Signed)
Addended by: Julaine Hua on: 09/02/2011 10:10 AM   Modules accepted: Orders

## 2011-09-02 NOTE — Telephone Encounter (Signed)
Closed by mistake

## 2011-09-04 NOTE — Progress Notes (Signed)
Quick Note:  Spoke with patients wife-aware of results. ______

## 2011-09-10 ENCOUNTER — Ambulatory Visit (INDEPENDENT_AMBULATORY_CARE_PROVIDER_SITE_OTHER): Payer: Medicare PPO | Admitting: Internal Medicine

## 2011-09-10 ENCOUNTER — Encounter: Payer: Self-pay | Admitting: Internal Medicine

## 2011-09-10 VITALS — BP 160/78 | HR 74 | Ht 67.0 in | Wt 205.6 lb

## 2011-09-10 DIAGNOSIS — J449 Chronic obstructive pulmonary disease, unspecified: Secondary | ICD-10-CM

## 2011-09-10 DIAGNOSIS — G4733 Obstructive sleep apnea (adult) (pediatric): Secondary | ICD-10-CM

## 2011-09-10 DIAGNOSIS — J4489 Other specified chronic obstructive pulmonary disease: Secondary | ICD-10-CM

## 2011-09-10 LAB — URINALYSIS, ROUTINE W REFLEX MICROSCOPIC
Bilirubin Urine: NEGATIVE
Hgb urine dipstick: NEGATIVE
Ketones, ur: NEGATIVE
Protein, ur: NEGATIVE
Urobilinogen, UA: 0.2

## 2011-09-10 LAB — COMPREHENSIVE METABOLIC PANEL
ALT: 37
AST: 32
Calcium: 9.8
GFR calc Af Amer: >60
Sodium: 133 — ABNORMAL LOW
Total Protein: 7.6

## 2011-09-10 LAB — DIFFERENTIAL
Basophils Absolute: 0
Eosinophils Absolute: 0.1
Eosinophils Relative: 1
Lymphocytes Relative: 13

## 2011-09-10 LAB — SEDIMENTATION RATE: Sed Rate: 4

## 2011-09-10 LAB — GIARDIA/CRYPTOSPORIDIUM SCREEN(EIA): Giardia Screen - EIA: NEGATIVE

## 2011-09-10 LAB — FECAL LACTOFERRIN, QUANT: Fecal Lactoferrin: NEGATIVE

## 2011-09-10 LAB — CBC
MCHC: 34.6
Platelets: 209
RBC: 4.89
RDW: 14.6 — ABNORMAL HIGH

## 2011-09-10 NOTE — Patient Instructions (Signed)
Please call as needed  It's time to get rid of those last cigarettes.. I strongly agree with the use of dust masks  Use water and Mucinex to clear the mucus.

## 2011-09-10 NOTE — Progress Notes (Signed)
Patient ID: Ernest [name], male    DOB: July 16, 1956, 55 y.o.   MRN: [id number]  HPI 07/30/11- 80 yoM smoker followed for COPD/ bronchitis, OSA/ CPAP Last here 04/01/11- note reviewed. He had felt better on a trial of maintenance prednisone. He has felt more short of breath through the hot summer, with no acute event and without blood, chest pain or fever. Easy DOE aroung home. Has been sharing nebulizer with his wife, who now has a cold and we discussed this. Has been worse in last month, and expecially in recent rainy weather. Noting a little ankle swelling. Smothered trying to take a shower.  Only tolerating his CPAP about 3 nights/ week because he feels smothered, waking sore in anterior chest in the mornings.  Now down to only 2-3 cigarettes/ day and trying to stop.  CXR 08/14/10- Nl NAD PFT 05/26/08- FEV1/FVC 2.44/ 0.76, incr 19% w/ BD, R 0.59 ?DLCO 0.69  09/10/11- 55 yoM smoker followed for COPD/ bronchitis, OSA/ CPAP He reports feeling better "finally doing okay". He is down to just a couple of cigarettes a day. He is using CPAP regularly now and is comfortable with it. Alpha I antitrypsin Report came back normal "M. M." on 08/09/2011. Chest x-ray from 08/15/2011 showed stable changes of COPD. He has had flu vaccine.  Review of Systems Constitutional:   No-   weight loss, night sweats, fevers, chills, fatigue, lassitude. HEENT:   No-  headaches, difficulty swallowing, tooth/dental problems, sore throat,       No-  sneezing, itching, ear ache, nasal congestion, post nasal drip,  CV: No chest pain, orthopnea, PND,  +swelling in lower extremities,  dizziness, palpitations Resp: No-   shortness of breath with exertion or at rest.             Little productive cough,  + non-productive cough,  No-  coughing up of blood.              No-   change in color of mucus.  +some wheezing.   Skin: No-   rash or lesions. GI:  No-   heartburn, indigestion, abdominal pain, nausea, vomiting, diarrhea,                  change in bowel habits, loss of appetite GU: No-   dysuria, change in color of urine, no urgency or frequency.  No- flank pain. MS:  No-   joint pain or swelling.  No- decreased range of motion.  No- back pain. Neuro- grossly normal to observation, Or:  Psych:  No- change in mood or affect. No depression or anxiety.  No memory loss.      Objective:   Physical Exam General- Alert, Oriented, Affect-appropriate, Distress- none acute   Stocky/ overweight. He looks relaxed and comfortable. Skin- rash-none, lesions- none, excoriation- none    tanned Lymphadenopathy- none Head- atraumatic            Eyes- Gross vision intact, PERRLA, conjunctivae clear secretions            Ears- Hearing, canals- normal            Nose- Clear, No- Septal dev, mucus, polyps, erosion, perforation             Throat- Mallampati III , mucosa red , drainage- none, tonsils- atrophic Neck- flexible , trachea midline, no stridor , thyroid nl, carotid no bruit Chest - symmetrical excursion , unlabored  Heart/CV- RRR , no murmur , no gallop  , no rub, nl s1 s2                           - JVD- none , edema-none, stasis changes- none, varices- none           Lung- clear to P&A-coarse breath sounds without wheeze,, cough- none , dullness-none, rub- none           Chest wall-  Abd- tender-no, distended-no, bowel sounds-present, HSM- no Br/ Gen/ Rectal- Not done, not indicated Extrem- cyanosis- none, clubbing, none, atrophy- none, strength- nl Neuro- grossly intact to observation

## 2011-09-12 NOTE — Assessment & Plan Note (Signed)
Resolving a sustained exacerbation of COPD but clearly doing better.

## 2011-09-12 NOTE — Assessment & Plan Note (Addendum)
Adequate compliance and control. Weight loss would help. Averaging a little over 4 hours at night CPAP use

## 2011-09-16 ENCOUNTER — Encounter: Payer: Self-pay | Admitting: Internal Medicine

## 2011-09-16 ENCOUNTER — Ambulatory Visit (INDEPENDENT_AMBULATORY_CARE_PROVIDER_SITE_OTHER): Payer: Medicare PPO | Admitting: Internal Medicine

## 2011-09-16 VITALS — BP 130/70 | HR 73 | Temp 99.0°F | Wt 201.0 lb

## 2011-09-16 DIAGNOSIS — R232 Flushing: Secondary | ICD-10-CM

## 2011-09-16 DIAGNOSIS — N419 Inflammatory disease of prostate, unspecified: Secondary | ICD-10-CM

## 2011-09-16 MED ORDER — TAMSULOSIN HCL 0.4 MG PO CAPS: 0.4000 mg | ORAL_CAPSULE | ORAL | Status: DC

## 2011-09-16 NOTE — Assessment & Plan Note (Signed)
Improved with 3 week course of levaquin.  Continue tamsulosin.

## 2011-09-16 NOTE — Patient Instructions (Signed)
Our office will contact you re: blood test results 

## 2011-09-16 NOTE — Progress Notes (Signed)
Subjective:    Patient ID: Ernest [name], male    DOB: Jun 22, 1956, 55 y.o.   MRN: [id number]  HPI  55 year old white male for followup regarding prostatitis. He reports finishing full course of Levaquin 500 mg daily x3 weeks. His urinary symptoms have significantly improved. He has also noticed improvement  with use of tamsulosin. He denies dizziness or lightheadedness.  His COPD symptoms have also significantly improved.   It is unclear whether the improvement is due to  use of Levaquin. He has still been unable to completely stop smoking.  He is still having intermittent hot flashes. He did not come in for testosterone blood test as requested.     Review of Systems Negative for chest pain.  Negative for shortness of breath  Past Medical History  Diagnosis Date  . CAD (coronary artery disease)     Left Main 30% stenosis, LAD 20 - 30 % stenosis, first and second diagonal branchesat 40 - 50%  stenosis with small arteries, circumflex had 30% stenosis in the large obtuse marginal, RCA at 70 - 80%  stenosis [not felt to be occlusive after evaluation with flow wire], distal 50 - 60% stenosis - [name][  . COPD (chronic obstructive pulmonary disease)     Dr. Jetty Duhamel  . Depression   . Anxiety   . Hyperlipidemia   . Chronic insomnia   . OSA (obstructive sleep apnea)     NPSG 09/10/10- AHI 11.3/hr  . Gout   . GERD (gastroesophageal reflux disease)   . PVD (peripheral vascular disease)     (ABI 0.9 on right and 0.89 on left)  severe left external iliac stenosis.  He  had successful stenting of his left external iliac per Dr. Excell Seltzer.  . DDD (degenerative disc disease)   . Hx of colonoscopy     History   Social History  . Marital Status: Married    Spouse Name: N/A    Number of Children: N/A  . Years of Education: N/A   Occupational History  . Disabled welder    Social History Main Topics  . Smoking status: Current Everyday Smoker -- 0.2 packs/day for 43 years     Types: Cigarettes  . Smokeless tobacco: Never Used   Comment: On Chantix.  Quit date 04/12/11 smokes 3 cigarettes daily  . Alcohol Use: No  . Drug Use: No  . Sexually Active: No   Other Topics Concern  . Not on file   Social History Narrative  . No narrative on file    Past Surgical History  Procedure Date  . Spinal fusion 03/05/2007    L4-L5  . Hip surgery     left  . Arm surgery     left  . Shoulder surgery     right  . C-spine surgery     Family History  Problem Relation Age of Onset  . Heart attack Mother   . Heart attack Sister   . Emphysema Sister     No Known Allergies  Current Outpatient Prescriptions on File Prior to Visit  Medication Sig Dispense Refill  . albuterol (PROVENTIL HFA;VENTOLIN HFA) 108 (90 BASE) MCG/ACT inhaler Inhale 2 puffs into the lungs 4 (four) times daily as needed for wheezing.  1 Inhaler  prn  . albuterol (PROVENTIL) (2.5 MG/3ML) 0.083% nebulizer solution Take 3 mLs (2.5 mg total) by nebulization every 4 (four) hours as needed for wheezing or shortness of breath.  25 vial  prn  . amLODipine (NORVASC)  10 MG tablet TAKE 1 TABLET BY MOUTH DAILY  30 tablet  7  . atorvastatin (LIPITOR) 20 MG tablet Take 1 tablet (20 mg total) by mouth daily.  30 tablet  6  . budesonide (PULMICORT FLEXHALER) 180 MCG/ACT inhaler Inhale 1 puff into the lungs 2 (two) times daily.        . carvedilol (COREG) 25 MG tablet Take 25 mg by mouth 2 (two) times daily with a meal.        . CHANTIX CONTINUING MONTH PAK 1 MG tablet TAKE AS DIRECTED  28 tablet  0  . clonazePAM (KLONOPIN) 1 MG tablet as needed.        . cyclobenzaprine (FLEXERIL) 10 MG tablet as needed.        [name] FLUoxetine (PROZAC) 40 MG capsule Take 40 mg by mouth daily.        . formoterol (FORADIL AEROLIZER) 12 MCG capsule for inhaler Place 1 capsule (12 mcg total) into inhaler and inhale 2 (two) times daily.  60 capsule  5  . isosorbide mononitrate (IMDUR) 30 MG 24 hr tablet Take 30 mg by mouth daily as  needed.       . mirtazapine (REMERON) 45 MG tablet Take 1/2 tablet at bedtime      . modafinil (PROVIGIL) 200 MG tablet Take 200 mg by mouth daily.        [name] morphine (MS CONTIN) 60 MG 12 hr tablet Take 60 mg by mouth 2 (two) times daily.        . Nebulizers (COMPRESSOR/NEBULIZER) MISC 1 Device by Does not apply route as directed.  1 each  0  . Omega-3 Fatty Acids (FISH OIL CONCENTRATE) 1000 MG CAPS Take 1 capsule by mouth 3 (three) times daily.        . pantoprazole (PROTONIX) 40 MG tablet Take 1 tablet (40 mg total) by mouth daily.  30 tablet  2    BP 130/70  Pulse 73  Temp(Src) 99 F (37.2 C) (Oral)  Wt 201 lb (91.173 kg)       Objective:   Physical Exam   Constitutional: Appears well-developed and well-nourished. No distress.  Mouth/Throat: Oropharynx is clear and moist.  Neck: Normal range of motion. Neck supple. No thyromegaly present. No carotid bruit Cardiovascular: Normal rate, regular rhythm and normal heart sounds.  Exam reveals no gallop and no friction rub.   No murmur heard. Pulmonary/Chest: Effort normal and breath sounds normal.  No wheezes. No rales.  Abdominal: Soft. Bowel sounds are normal. No mass. There is no tenderness.  Neurological: Alert. No cranial nerve deficit.  Skin: Skin is warm and dry.  Psychiatric: Normal mood and affect. Behavior is normal.      Assessment & Plan:

## 2011-09-16 NOTE — Assessment & Plan Note (Signed)
Check testosterone and free testosterone levels as planned.

## 2011-11-06 ENCOUNTER — Other Ambulatory Visit: Payer: Self-pay | Admitting: *Deleted

## 2011-11-06 MED ORDER — AMLODIPINE BESYLATE 10 MG PO TABS: 10.0000 mg | ORAL_TABLET | Freq: Every day | ORAL | Status: DC

## 2011-11-11 ENCOUNTER — Telehealth: Payer: Self-pay | Admitting: Internal Medicine

## 2011-11-11 NOTE — Telephone Encounter (Signed)
I spoke with spouse and she states pt has a terrible cold and wants to bring pt in when she comes in tomorrow. [facility] had an opening at 2:30 and she states that would be fine.

## 2011-11-12 ENCOUNTER — Encounter: Payer: Self-pay | Admitting: Internal Medicine

## 2011-11-12 ENCOUNTER — Ambulatory Visit (INDEPENDENT_AMBULATORY_CARE_PROVIDER_SITE_OTHER): Payer: Medicare PPO | Admitting: Internal Medicine

## 2011-11-12 VITALS — BP 122/66 | HR 75 | Ht 67.0 in | Wt 203.6 lb

## 2011-11-12 DIAGNOSIS — F17200 Nicotine dependence, unspecified, uncomplicated: Secondary | ICD-10-CM

## 2011-11-12 DIAGNOSIS — J4489 Other specified chronic obstructive pulmonary disease: Secondary | ICD-10-CM

## 2011-11-12 DIAGNOSIS — G4733 Obstructive sleep apnea (adult) (pediatric): Secondary | ICD-10-CM

## 2011-11-12 DIAGNOSIS — J449 Chronic obstructive pulmonary disease, unspecified: Secondary | ICD-10-CM

## 2011-11-12 MED ORDER — PREDNISONE 10 MG PO TABS: ORAL_TABLET | ORAL | Status: DC

## 2011-11-12 MED ORDER — AZITHROMYCIN 250 MG PO TABS: ORAL_TABLET | ORAL | Status: AC

## 2011-11-12 MED ORDER — METHYLPREDNISOLONE ACETATE 80 MG/ML IJ SUSP
80.0000 mg | Freq: Once | INTRAMUSCULAR | Status: AC
  Administered 2011-11-12: 80 mg via INTRAMUSCULAR

## 2011-11-12 MED ORDER — LEVALBUTEROL HCL 0.63 MG/3ML IN NEBU
0.6300 mg | INHALATION_SOLUTION | Freq: Once | RESPIRATORY_TRACT | Status: AC
  Administered 2011-11-12: 0.63 mg via RESPIRATORY_TRACT

## 2011-11-12 NOTE — Patient Instructions (Signed)
Neb xop 0.63  Depo 80  Scripts sent for prednisone taper and for Z pak

## 2011-11-12 NOTE — Progress Notes (Signed)
Patient ID: Ernest [name], male    DOB: 1956/08/26, 55 y.o.   MRN: [id number]  HPI 07/30/11- 50 yoM smoker followed for COPD/ bronchitis, OSA/ CPAP Last here 04/01/11- note reviewed. He had felt better on a trial of maintenance prednisone. He has felt more short of breath through the hot summer, with no acute event and without blood, chest pain or fever. Easy DOE aroung home. Has been sharing nebulizer with his wife, who now has a cold and we discussed this. Has been worse in last month, and expecially in recent rainy weather. Noting a little ankle swelling. Smothered trying to take a shower.  Only tolerating his CPAP about 3 nights/ week because he feels smothered, waking sore in anterior chest in the mornings.  Now down to only 2-3 cigarettes/ day and trying to stop.  CXR 08/14/10- Nl NAD PFT 05/26/08- FEV1/FVC 2.44/ 0.76, incr 19% w/ BD, R 0.59 ?DLCO 0.69  09/10/11- 55 yoM smoker followed for COPD/ bronchitis, OSA/ CPAP He reports feeling better "finally doing okay". He is down to just a couple of cigarettes a day. He is using CPAP regularly now and is comfortable with it. Alpha I antitrypsin Report came back normal "M. M." on 08/09/2011. Chest x-ray from 08/15/2011 showed stable changes of COPD. He has had flu vaccine.  11/12/11- 55 yoM smoker followed for COPD/ bronchitis, OSA/ CPAP Here with wife. Has had flu shot. He says he was doing well until 2 weeks ago. He again is coughing thick mucus which is difficult to clear. He depends on his metered inhaler and nebulizer to help clear his airways. Started Mucinex 3 days ago. Mucus is white. He denies fever, sore throat, chest pain, GI upset. Because of orthopnea he is sitting up to sleep for the past 2 nights.  Review of Systems Constitutional:   No-   weight loss, night sweats, fevers, chills,+ fatigue, lassitude. HEENT:   No-  headaches, difficulty swallowing, tooth/dental problems, sore throat,       No-  sneezing, itching, ear ache, nasal  congestion, post nasal drip,  CV: No chest pain, +orthopnea, PND,  Little swelling in lower extremities,  dizziness, palpitations Resp: No-  shortness of breath with exertion or at rest.             + productive cough,  + non-productive cough,  No-  coughing up of blood.              No-   change in color of mucus.  +some wheezing.   Skin: No-   rash or lesions. GI:  No-   heartburn, indigestion, abdominal pain, nausea, vomiting, diarrhea,                 change in bowel habits, loss of appetite GU: No-   dysuria, change in color of urine, no urgency or frequency.  No- flank pain. MS:  No-   joint pain or swelling.  No- decreased range of motion.  No- back pain. Neuro- grossly normal to observation, Or:  Psych:  No- change in mood or affect. No depression or anxiety.  No memory loss.      Objective:   Physical Exam General- Alert, Oriented, Affect-appropriate, Distress- none acute   Stocky/ overweight.  Skin- rash-none, lesions- none, excoriation- none     Lymphadenopathy- none Head- atraumatic            Eyes- Gross vision intact, PERRLA, conjunctivae clear secretions  Ears- Hearing, canals- normal            Nose- Clear, No- Septal dev, mucus, polyps, erosion, perforation             Throat- Mallampati III , mucosa red , drainage- none, tonsils- atrophic Neck- flexible , trachea midline, no stridor , thyroid nl, carotid no bruit Chest - symmetrical excursion , unlabored           Heart/CV- RRR , no murmur , no gallop  , no rub, nl s1 s2                           - JVD- none , edema-none, stasis changes- none, varices- none           Lung-Diffuse wheeze, few rhonchi, cough- none , dullness-none, rub- none           Chest wall-  Abd- tender-no, distended-no, bowel sounds-present, HSM- no Br/ Gen/ Rectal- Not done, not indicated Extrem- cyanosis- none, clubbing, none, atrophy- none, strength- nl Neuro- grossly intact to observation

## 2011-11-13 ENCOUNTER — Other Ambulatory Visit: Payer: Self-pay | Admitting: Internal Medicine

## 2011-11-15 ENCOUNTER — Other Ambulatory Visit: Payer: Self-pay | Admitting: Cardiology

## 2011-11-16 NOTE — Assessment & Plan Note (Signed)
Severe chronic bronchitis with exacerbation- now a subacute bronchitis. Plan- neb Rx, depomedrol, prednisone taper to hold, Z pak

## 2011-11-16 NOTE — Assessment & Plan Note (Signed)
Continues to smoke against advice.

## 2011-11-16 NOTE — Assessment & Plan Note (Signed)
He uses CPAP reliably when he is not coughing too much.

## 2011-11-21 ENCOUNTER — Other Ambulatory Visit: Payer: Self-pay | Admitting: Internal Medicine

## 2011-12-02 ENCOUNTER — Other Ambulatory Visit: Payer: Self-pay | Admitting: Allergy

## 2011-12-02 NOTE — Telephone Encounter (Signed)
Left message for patient to call with sxs

## 2011-12-31 ENCOUNTER — Other Ambulatory Visit: Payer: Self-pay | Admitting: Internal Medicine

## 2012-01-14 ENCOUNTER — Ambulatory Visit (INDEPENDENT_AMBULATORY_CARE_PROVIDER_SITE_OTHER): Payer: Medicare PPO | Admitting: Internal Medicine

## 2012-01-14 ENCOUNTER — Encounter: Payer: Self-pay | Admitting: Internal Medicine

## 2012-01-14 ENCOUNTER — Ambulatory Visit (INDEPENDENT_AMBULATORY_CARE_PROVIDER_SITE_OTHER)
Admission: RE | Admit: 2012-01-14 | Discharge: 2012-01-14 | Disposition: A | Discharge status: Home / Self Care | Payer: Medicare PPO | Source: Ambulatory Visit | Attending: Internal Medicine | Admitting: Internal Medicine

## 2012-01-14 VITALS — BP 120/78 | HR 65 | Ht 67.0 in | Wt 198.0 lb

## 2012-01-14 DIAGNOSIS — J449 Chronic obstructive pulmonary disease, unspecified: Secondary | ICD-10-CM

## 2012-01-14 DIAGNOSIS — F17200 Nicotine dependence, unspecified, uncomplicated: Secondary | ICD-10-CM

## 2012-01-14 DIAGNOSIS — G4733 Obstructive sleep apnea (adult) (pediatric): Secondary | ICD-10-CM

## 2012-01-14 MED ORDER — AMOXICILLIN 500 MG PO CAPS: 500.0000 mg | ORAL_CAPSULE | Freq: Three times a day (TID) | ORAL | Status: AC

## 2012-01-14 NOTE — Patient Instructions (Signed)
Order CXR- DX COPD  Script sent for antibiotic  Please keep trying to stop smoking

## 2012-01-14 NOTE — Progress Notes (Signed)
Patient ID: Ernest [name], male    DOB: 1956-06-24, 56 y.o.   MRN: [id number]  HPI 07/30/11- 17 yoM smoker followed for COPD/ bronchitis, OSA/ CPAP Last here 04/01/11- note reviewed. He had felt better on a trial of maintenance prednisone. He has felt more short of breath through the hot summer, with no acute event and without blood, chest pain or fever. Easy DOE around home. Has been sharing nebulizer with his wife, who now has a cold and we discussed this. Has been worse in last month, and expecially in recent rainy weather. Noting a little ankle swelling. Smothered trying to take a shower.  Only tolerating his CPAP about 3 nights/ week because he feels smothered, waking sore in anterior chest in the mornings.  Now down to only 2-3 cigarettes/ day and trying to stop.  CXR 08/14/10- Nl NAD PFT 05/26/08- FEV1/FVC 2.44/ 0.76, incr 19% w/ BD, R 0.59 ?DLCO 0.69  09/10/11- 55 yoM smoker followed for COPD/ bronchitis, OSA/ CPAP He reports feeling better "finally doing okay". He is down to just a couple of cigarettes a day. He is using CPAP regularly now and is comfortable with it. Alpha I antitrypsin Report came back normal "M. M." on 08/09/2011. Chest x-ray from 08/15/2011 showed stable changes of COPD. He has had flu vaccine.  11/12/11- 55 yoM smoker followed for COPD/ bronchitis, OSA/ CPAP Here with wife. Has had flu shot. He says he was doing well until 2 weeks ago. He again is coughing thick mucus which is difficult to clear. He depends on his metered inhaler and nebulizer to help clear his airways. Started Mucinex 3 days ago. Mucus is white. He denies fever, sore throat, chest pain, GI upset. Because of orthopnea he is sitting up to sleep for the past 2 nights.  01/14/12- 55 yoM smoker followed for COPD/ bronchitis, OSA/ CPAP Increase congested cough green to yellow sputum but he does not feel sick and denies fever. Sister who is a smoker recently died of lung cancer. Despite that and all of our  discussions, he still smokes a few cigarettes daily. We talked again about motivation to stop completely. Continues CPAP and Provigil one or 2 daily.  Review of Systems- see HPI Constitutional:   No-   weight loss, night sweats, fevers, chills,+ fatigue, lassitude. HEENT:   No-  headaches, difficulty swallowing, tooth/dental problems, sore throat,       No-  sneezing, itching, ear ache, nasal congestion, post nasal drip,  CV: No chest pain, +orthopnea, PND,  Little swelling in lower extremities,  dizziness, palpitations Resp: No-  shortness of breath with exertion or at rest.             + productive cough,  + non-productive cough,  No-  coughing up of blood.              No-   change in color of mucus.  +some wheezing.   Skin: No-   rash or lesions. GI:  No-   heartburn, indigestion, abdominal pain, nausea, vomiting, diarrhea,                 change in bowel habits, loss of appetite GU:  MS:  No-   joint pain or swelling.  No- decreased range of motion.  No- back pain. Neuro- grossly normal to observation, Or:  Psych:  No- change in mood or affect. No depression or anxiety.  No memory loss.      Objective:   Physical Exam  General- Alert, Oriented, Affect-appropriate, Distress- none acute   Stocky/ overweight, full beard Skin- rash-none, lesions- none, excoriation- none     Lymphadenopathy- none Head- atraumatic            Eyes- Gross vision intact, PERRLA, conjunctivae clear secretions            Ears- Hearing, canals- normal            Nose- Clear, No- Septal dev, mucus, polyps, erosion, perforation             Throat- Mallampati III , mucosa red , drainage- none, tonsils- atrophic Neck- flexible , trachea midline, no stridor , thyroid nl, carotid no bruit Chest - symmetrical excursion , unlabored           Heart/CV- RRR , no murmur , no gallop  , no rub, nl s1 s2                           - JVD- none , edema-none, stasis changes- none, varices- none           Lung-Mild wheeze,  unlabored,  few rhonchi, cough- none , dullness-none, rub- none           Chest wall-  Abd- tender-no, distended-no, bowel sounds-present, HSM- no Br/ Gen/ Rectal- Not done, not indicated Extrem- cyanosis- none, clubbing, none, atrophy- none, strength- nl. 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 Neuro- grossly intact to observation

## 2012-01-18 NOTE — Assessment & Plan Note (Signed)
Remains compliant with CPAP 

## 2012-01-18 NOTE — Assessment & Plan Note (Signed)
Smoking cessation counseling was reinforced today.

## 2012-01-18 NOTE — Assessment & Plan Note (Signed)
Acute exacerbation with bronchitis. Plan-amoxicillin, chest x-ray, smoking cessation.

## 2012-01-23 NOTE — Progress Notes (Signed)
Quick Note:  Pt aware of results. ______ 

## 2012-03-10 ENCOUNTER — Ambulatory Visit (INDEPENDENT_AMBULATORY_CARE_PROVIDER_SITE_OTHER): Payer: Medicare PPO | Admitting: Internal Medicine

## 2012-03-10 ENCOUNTER — Encounter: Payer: Self-pay | Admitting: Internal Medicine

## 2012-03-10 VITALS — BP 118/62 | HR 71 | Ht 67.0 in | Wt 204.0 lb

## 2012-03-10 DIAGNOSIS — G4733 Obstructive sleep apnea (adult) (pediatric): Secondary | ICD-10-CM

## 2012-03-10 DIAGNOSIS — J449 Chronic obstructive pulmonary disease, unspecified: Secondary | ICD-10-CM

## 2012-03-10 MED ORDER — ROFLUMILAST 500 MCG PO TABS: 500.0000 ug | ORAL_TABLET | Freq: Every day | ORAL | Status: DC

## 2012-03-10 NOTE — Patient Instructions (Signed)
Sample and card for Daliresp   To reduce bronchitis attacks.   Start with 1/2 tab every other day after a meal, for 2 weeks         After 2 weeks, if tolerating it ok, then try very slowly increasing the dose to 1 tab, every day, after food.   Please call as needed

## 2012-03-10 NOTE — Progress Notes (Signed)
Patient ID: Ernest [name], male    DOB: [birth year], 56 y.o.   MRN: [id number]  HPI 07/30/11- 56 yoM smoker followed for COPD/ bronchitis, OSA/ CPAP Last here 04/01/11- note reviewed. He had felt better on a trial of maintenance prednisone. He has felt more short of breath through the hot summer, with no acute event and without blood, chest pain or fever. Easy DOE around home. Has been sharing nebulizer with his wife, who now has a cold and we discussed this. Has been worse in last month, and expecially in recent rainy weather. Noting a little ankle swelling. Smothered trying to take a shower.  Only tolerating his CPAP about 3 nights/ week because he feels smothered, waking sore in anterior chest in the mornings.  Now down to only 2-3 cigarettes/ day and trying to stop.  CXR 08/14/10- Nl NAD PFT 05/26/08- FEV1/FVC 2.44/ 0.76, incr 19% w/ BD, R 0.59 ?DLCO 0.69  09/10/11- 56 yoM smoker followed for COPD/ bronchitis, OSA/ CPAP He reports feeling better "finally doing okay". He is down to just a couple of cigarettes a day. He is using CPAP regularly now and is comfortable with it. Alpha I antitrypsin Report came back normal "M. M." on 08/09/2011. Chest x-ray from 08/15/2011 showed stable changes of COPD. He has had flu vaccine.  11/12/11- 56 yoM smoker followed for COPD/ bronchitis, OSA/ CPAP Here with wife. Has had flu shot. He says he was doing well until 2 weeks ago. He again is coughing thick mucus which is difficult to clear. He depends on his metered inhaler and nebulizer to help clear his airways. Started Mucinex 3 days ago. Mucus is white. He denies fever, sore throat, chest pain, GI upset. Because of orthopnea he is sitting up to sleep for the past 2 nights.  01/14/12- 56 yoM smoker followed for COPD/ bronchitis, OSA/ CPAP Increase congested cough green to yellow sputum but he does not feel sick and denies fever. Sister who was a smoker recently died of lung cancer. Despite that and all of our  discussions, he still smokes a few cigarettes daily. We talked again about motivation to stop completely. Continues CPAP and Provigil one or 2 daily.  03/10/12  56 yoM smoker followed for COPD/ bronchitis, OSA/ CPAP Persistent bronchitic cough. Sputum is not purulent. He is "working on" his smoking habit but blames pollen for keeping him S. coughing now. Remains fully compliant with CPAP, all night every night. Still needs Provigil to help with residual daytime sleepiness as before.  Review of Systems- see HPI Constitutional:   No-   weight loss, night sweats, fevers, chills,+ fatigue, lassitude. HEENT:   No-  headaches, difficulty swallowing, tooth/dental problems, sore throat,       No-  sneezing, itching, ear ache, nasal congestion, post nasal drip,  CV: No chest pain, +orthopnea, PND,  Little swelling in lower extremities,  dizziness, palpitations Resp: No-  shortness of breath with exertion or at rest.             + productive cough,  + non-productive cough,  No-  coughing up of blood.              No-   change in color of mucus.  +some wheezing.   Skin: No-   rash or lesions. GI:  No-   heartburn, indigestion, abdominal pain, nausea, vomiting, GU:  MS:  No-   joint pain or swelling.   Neuro- grossly normal to observation, Or:  Psych:  No-  change in mood or affect. No depression or anxiety.  No memory loss.      Objective:   Physical Exam General- Alert, Oriented, Affect-appropriate, Distress- none acute   Stocky/ overweight, full beard Skin- rash-none, lesions- none, excoriation- none     Lymphadenopathy- none Head- atraumatic            Eyes- Gross vision intact, PERRLA, conjunctivae clear secretions            Ears- Hearing, canals- normal            Nose- Clear, No- Septal dev, mucus, polyps, erosion, perforation             Throat- Mallampati III , mucosa red , drainage- none, tonsils- atrophic Neck- flexible , trachea midline, no stridor , thyroid nl, carotid no  bruit Chest - symmetrical excursion , unlabored           Heart/CV- RRR , no murmur , no gallop  , no rub, nl s1 s2                           - JVD- none , edema-none, stasis changes- none, varices- none           Lung-Coarse, unlabored,  few rhonchi, cough- none , dullness-none, rub- none           Chest wall-  Abd-  Br/ Gen/ Rectal- Not done, not indicated Extrem- cyanosis- none, clubbing, none, atrophy- none, strength- nl. 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 Neuro- grossly intact to observation

## 2012-03-11 ENCOUNTER — Other Ambulatory Visit: Payer: Self-pay | Admitting: Cardiology

## 2012-03-15 NOTE — Assessment & Plan Note (Signed)
Compliant with CPAP. Still needs Provigil for residual daytime sleepiness.

## 2012-03-15 NOTE — Assessment & Plan Note (Signed)
Active chronic bronchitis. Plan-tried Daliresp. Later consider theophylline.

## 2012-03-18 ENCOUNTER — Encounter: Payer: Self-pay | Admitting: Internal Medicine

## 2012-03-18 ENCOUNTER — Other Ambulatory Visit: Payer: Self-pay | Admitting: Internal Medicine

## 2012-03-18 ENCOUNTER — Ambulatory Visit (INDEPENDENT_AMBULATORY_CARE_PROVIDER_SITE_OTHER): Payer: Medicare PPO | Admitting: Internal Medicine

## 2012-03-18 VITALS — BP 124/64 | HR 76 | Temp 98.3°F | Ht 67.0 in | Wt 200.0 lb

## 2012-03-18 DIAGNOSIS — K219 Gastro-esophageal reflux disease without esophagitis: Secondary | ICD-10-CM

## 2012-03-18 DIAGNOSIS — R232 Flushing: Secondary | ICD-10-CM

## 2012-03-18 DIAGNOSIS — J449 Chronic obstructive pulmonary disease, unspecified: Secondary | ICD-10-CM

## 2012-03-18 MED ORDER — PREDNISONE 10 MG PO TABS: ORAL_TABLET | ORAL | Status: DC

## 2012-03-18 MED ORDER — DOXYCYCLINE HYCLATE 100 MG PO TABS: 100.0000 mg | ORAL_TABLET | Freq: Two times a day (BID) | ORAL | Status: AC

## 2012-03-18 MED ORDER — ESOMEPRAZOLE MAGNESIUM 40 MG PO CPDR: 40.0000 mg | DELAYED_RELEASE_CAPSULE | Freq: Two times a day (BID) | ORAL | Status: DC

## 2012-03-18 NOTE — Progress Notes (Signed)
Subjective:    Patient ID: Ernest [name], male    DOB: Nov 21, 1956, 56 y.o.   MRN: [id number]  HPI  56 year old with history of COPD and chronic tobacco use complains of worsening cough over the last 3 days. Cough is productive of white sputum. He denies fevers but complains of wheezing and shortness of breath. Coughing worse with tobacco use.  Patient also complains of persistent reflux symptoms especially at night despite taking protonix daily.  Review of Systems    negative for fever or chills  Past Medical History  Diagnosis Date  . CAD (coronary artery disease)     Left Main 30% stenosis, LAD 20 - 30 % stenosis, first and second diagonal branchesat 40 - 50%  stenosis with small arteries, circumflex had 30% stenosis in the large obtuse marginal, RCA at 70 - 80%  stenosis [not felt to be occlusive after evaluation with flow wire], distal 50 - 60% stenosis - [name][  . COPD (chronic obstructive pulmonary disease)     Dr. Jetty Duhamel  . Depression   . Anxiety   . Hyperlipidemia   . Chronic insomnia   . OSA (obstructive sleep apnea)     NPSG 09/10/10- AHI 11.3/hr  . Gout   . GERD (gastroesophageal reflux disease)   . PVD (peripheral vascular disease)     (ABI 0.9 on right and 0.89 on left)  severe left external iliac stenosis.  He  had successful stenting of his left external iliac per Dr. Excell Seltzer.  . DDD (degenerative disc disease)   . Hx of colonoscopy     History   Social History  . Marital Status: Married    Spouse Name: N/A    Number of Children: N/A  . Years of Education: N/A   Occupational History  . Disabled welder    Social History Main Topics  . Smoking status: Current Everyday Smoker -- 0.2 packs/day for 43 years    Types: Cigarettes  . Smokeless tobacco: Never Used   Comment: On Chantix.  Quit date 04/12/11 smokes 3 cigarettes daily  . Alcohol Use: No  . Drug Use: No  . Sexually Active: No   Other Topics Concern  . Not on file    Social History Narrative  . No narrative on file    Past Surgical History  Procedure Date  . Spinal fusion 03/05/2007    L4-L5  . Hip surgery     left  . Arm surgery     left  . Shoulder surgery     right  . C-spine surgery     Family History  Problem Relation Age of Onset  . Heart attack Mother   . Heart attack Sister   . Emphysema Sister     No Known Allergies  Current Outpatient Prescriptions on File Prior to Visit  Medication Sig Dispense Refill  . albuterol (PROVENTIL HFA;VENTOLIN HFA) 108 (90 BASE) MCG/ACT inhaler Inhale 2 puffs into the lungs 4 (four) times daily as needed for wheezing.  1 Inhaler  prn  . albuterol (PROVENTIL) (2.5 MG/3ML) 0.083% nebulizer solution Take 3 mLs (2.5 mg total) by nebulization every 4 (four) hours as needed for wheezing or shortness of breath.  25 vial  prn  . amLODipine (NORVASC) 10 MG tablet Take 1 tablet (10 mg total) by mouth daily.  30 tablet  7  . atorvastatin (LIPITOR) 20 MG tablet TAKE 1 TABLET (20 MG TOTAL) BY MOUTH DAILY.  30 tablet  6  . budesonide (  PULMICORT FLEXHALER) 180 MCG/ACT inhaler Inhale 1 puff into the lungs 2 (two) times daily.        . carvedilol (COREG) 25 MG tablet Take 25 mg by mouth 2 (two) times daily with a meal.        . clonazePAM (KLONOPIN) 1 MG tablet as needed.        . cyclobenzaprine (FLEXERIL) 10 MG tablet as needed.        [name] FLUoxetine (PROZAC) 40 MG capsule Take 40 mg by mouth daily.        [name] FORADIL AEROLIZER 12 MCG capsule for inhaler PLACE 1 CAPSULE (12 MCG TOTAL) INTO INHALER AND INHALE 2 (TWO) TIMES DAILY.  60 each  5  . isosorbide mononitrate (IMDUR) 30 MG 24 hr tablet Take 30 mg by mouth daily as needed.       . mirtazapine (REMERON) 45 MG tablet Take 1/2 tablet at bedtime      . modafinil (PROVIGIL) 200 MG tablet Take 200 mg by mouth daily.        [name] morphine (MS CONTIN) 60 MG 12 hr tablet Take 60 mg by mouth 2 (two) times daily.        . Nebulizers (COMPRESSOR/NEBULIZER) MISC 1 Device by  Does not apply route as directed.  1 each  0  . Omega-3 Fatty Acids (FISH OIL CONCENTRATE) 1000 MG CAPS Take 1 capsule by mouth 3 (three) times daily.        . roflumilast (DALIRESP) 500 MCG TABS tablet Take 1 tablet (500 mcg total) by mouth daily. After a meal  30 tablet  prn  . esomeprazole (NEXIUM) 40 MG capsule Take 1 capsule (40 mg total) by mouth 2 (two) times daily before a meal.  60 capsule  3    BP 124/64  Pulse 76  Temp(Src) 98.3 F (36.8 C) (Oral)  Ht 5\' 7"  (1.702 m)  Wt 200 lb (90.719 kg)  BMI 31.32 kg/m2    Objective:   Physical Exam  Constitutional: He appears well-developed and well-nourished.  HENT:  Head: Normocephalic and atraumatic.  Right Ear: External ear normal.       Left tympanic membrane retracted and slightly erythematous. Mild oropharyngeal erythema  Neck: Neck supple.  Cardiovascular: Normal rate, regular rhythm and normal heart sounds.   Pulmonary/Chest: Effort normal. He has wheezes.  Musculoskeletal: He exhibits no edema.  Lymphadenopathy:    He has no cervical adenopathy.  Skin: Skin is warm and dry.  Psychiatric: He has a normal mood and affect. His behavior is normal.          Assessment & Plan:

## 2012-03-18 NOTE — Assessment & Plan Note (Signed)
Patient experiencing persistent reflux symptoms despite using protonic once daily. Switch to Nexium 40 mg twice a day. Unclear whether reflux contributing to chronic bronchitis.

## 2012-03-18 NOTE — Assessment & Plan Note (Signed)
The patient has mildly depressed testosterone levels. No treatment for now.

## 2012-03-18 NOTE — Assessment & Plan Note (Signed)
Patient with acute on chronic bronchitis.  Treat with doxy and prednisone.  He hasn't noticed any improvement since starting Daliresp.  Patient advised to call office if symptoms persist or worsen.

## 2012-03-18 NOTE — Patient Instructions (Signed)
Please call our office if your symptoms do not improve or gets worse.  

## 2012-04-29 ENCOUNTER — Other Ambulatory Visit: Payer: Self-pay | Admitting: Internal Medicine

## 2012-05-12 ENCOUNTER — Ambulatory Visit (INDEPENDENT_AMBULATORY_CARE_PROVIDER_SITE_OTHER): Payer: Medicare PPO | Admitting: Internal Medicine

## 2012-05-12 ENCOUNTER — Encounter: Payer: Self-pay | Admitting: Internal Medicine

## 2012-05-12 VITALS — BP 142/62 | HR 81 | Ht 67.0 in | Wt 207.4 lb

## 2012-05-12 DIAGNOSIS — G4733 Obstructive sleep apnea (adult) (pediatric): Secondary | ICD-10-CM

## 2012-05-12 DIAGNOSIS — J449 Chronic obstructive pulmonary disease, unspecified: Secondary | ICD-10-CM

## 2012-05-12 DIAGNOSIS — F17200 Nicotine dependence, unspecified, uncomplicated: Secondary | ICD-10-CM

## 2012-05-12 MED ORDER — PROMETHAZINE-CODEINE 6.25-10 MG/5ML PO SYRP: 5.0000 mL | ORAL_SOLUTION | Freq: Four times a day (QID) | ORAL | Status: AC | PRN

## 2012-05-12 MED ORDER — BENZONATATE 200 MG PO CAPS: 200.0000 mg | ORAL_CAPSULE | Freq: Three times a day (TID) | ORAL | Status: AC | PRN

## 2012-05-12 NOTE — Progress Notes (Signed)
Patient ID: Ernest [name], male    DOB: 23-Mar-1956, 56 y.o.   MRN: [id number]  HPI 07/30/11- 68 yoM smoker followed for COPD/ bronchitis, OSA/ CPAP Last here 04/01/11- note reviewed. He had felt better on a trial of maintenance prednisone. He has felt more short of breath through the hot summer, with no acute event and without blood, chest pain or fever. Easy DOE around home. Has been sharing nebulizer with his wife, who now has a cold and we discussed this. Has been worse in last month, and expecially in recent rainy weather. Noting a little ankle swelling. Smothered trying to take a shower.  Only tolerating his CPAP about 3 nights/ week because he feels smothered, waking sore in anterior chest in the mornings.  Now down to only 2-3 cigarettes/ day and trying to stop.  CXR 08/14/10- Nl NAD PFT 05/26/08- FEV1/FVC 2.44/ 0.76, incr 19% w/ BD, R 0.59 ?DLCO 0.69  09/10/11- 55 yoM smoker followed for COPD/ bronchitis, OSA/ CPAP He reports feeling better "finally doing okay". He is down to just a couple of cigarettes a day. He is using CPAP regularly now and is comfortable with it. Alpha I antitrypsin Report came back normal "M. M." on 08/09/2011. Chest x-ray from 08/15/2011 showed stable changes of COPD. He has had flu vaccine.  11/12/11- 55 yoM smoker followed for COPD/ bronchitis, OSA/ CPAP Here with wife. Has had flu shot. He says he was doing well until 2 weeks ago. He again is coughing thick mucus which is difficult to clear. He depends on his metered inhaler and nebulizer to help clear his airways. Started Mucinex 3 days ago. Mucus is white. He denies fever, sore throat, chest pain, GI upset. Because of orthopnea he is sitting up to sleep for the past 2 nights.  01/14/12- 55 yoM smoker followed for COPD/ bronchitis, OSA/ CPAP Increase congested cough green to yellow sputum but he does not feel sick and denies fever. Sister who was a smoker recently died of lung cancer. Despite that and all of our  discussions, he still smokes a few cigarettes daily. We talked again about motivation to stop completely. Continues CPAP and Provigil one or 2 daily.  03/10/12  56 yoM smoker followed for COPD/ bronchitis, OSA/ CPAP Persistent bronchitic cough. Sputum is not purulent. He is "working on" his smoking habit but blames pollen for keeping him S. coughing now. Remains fully compliant with CPAP, all night every night. Still needs Provigil to help with residual daytime sleepiness as before.  05/12/12- 56 yoM smoker followed for COPD/ bronchitis, OSA/ CPAP Stays SOB all the time-even at rest; wheezing, coughing-productive-yellow in color, chest congestion, nasal congestion,. He is more interested today in smoking cessation which we discussed. He had quit for Daliresp after 10 days because of sustained nausea. He is taking all of his regular medicines and using his rescue inhaler several times a day. Off prednisone now for 1-2 months. CXR 01/23/12-  IMPRESSION:  No acute cardiopulmonary abnormality.  Original Report Authenticated By: Harley Hallmark, M.D.   Review of Systems- see HPI Constitutional:   No-   weight loss, night sweats, fevers, chills,+ fatigue, lassitude. HEENT:   No-  headaches, difficulty swallowing, tooth/dental problems, sore throat,       No-  sneezing, itching, ear ache, nasal congestion, post nasal drip,  CV: No chest pain, +orthopnea, PND,  Little swelling in lower extremities,  dizziness, palpitations Resp: No-  shortness of breath with exertion or at rest.             +  productive cough,  + non-productive cough,  No-  coughing up of blood.              No-   change in color of mucus.  +some wheezing.   Skin: No-   rash or lesions. GI:  No-   heartburn, indigestion, abdominal pain, nausea, vomiting, GU:  MS:  No-   joint pain or swelling.   Neuro- nothing unusual  Psych:  No- change in mood or affect. No depression or anxiety.  No memory loss.    Objective:   Physical  Exam General- Alert, Oriented, Affect-appropriate, Distress- none acute   Stocky/ overweight, full beard Skin- rash-none, lesions- none, excoriation- none. Tanned     Lymphadenopathy- none Head- atraumatic            Eyes- Gross vision intact, PERRLA, conjunctivae clear secretions            Ears- Hearing, canals- normal            Nose- Clear, No- Septal dev, mucus, polyps, erosion, perforation             Throat- Mallampati III , mucosa red , drainage- none, tonsils- atrophic, hoarse Neck- flexible , trachea midline, no stridor , thyroid nl, carotid no bruit Chest - symmetrical excursion , unlabored           Heart/CV- RRR , no murmur , no gallop  , no rub, nl s1 s2                           - JVD- none , edema-none, stasis changes- none, varices- none           Lung-Coarse, unlabored,  +scattered wheeze, cough+ with deep breath , dullness-none, rub- none           Chest wall-  Abd-  Br/ Gen/ Rectal- Not done, not indicated Extrem- cyanosis- none, clubbing, none, atrophy- none, strength- nl. 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 Neuro- grossly intact to observation

## 2012-05-12 NOTE — Patient Instructions (Addendum)
Script for cough syrup  Script to try benzonatate perles for cough. Use these to stretch out how often you need to take the cough syrup.  Sample Nicoderm CQ and Nicotine gum. Information on the [facility] smoking cessation program. We know it isn't easy to quit, but the cigarettes are ruining the only lungs you've got, so you need to stop now.

## 2012-05-17 ENCOUNTER — Other Ambulatory Visit: Payer: Self-pay | Admitting: Cardiology

## 2012-05-20 ENCOUNTER — Encounter: Payer: Self-pay | Admitting: Internal Medicine

## 2012-05-20 NOTE — Assessment & Plan Note (Signed)
Severe COPD with active bronchitis exacerbation. Plan-emphasis on smoking cessation. Benzonatate. Sample of Tudorza inhaler

## 2012-05-20 NOTE — Assessment & Plan Note (Signed)
More motivated to stop smoking. We gave information on the [facility] smoking cessation program with sample and information on NicoDerm CQ and nicotine gum.

## 2012-05-20 NOTE — Assessment & Plan Note (Signed)
He continues using CPAP all night every night and occasional daytime Provigil.

## 2012-05-27 ENCOUNTER — Telehealth: Payer: Self-pay | Admitting: Internal Medicine

## 2012-05-27 NOTE — Telephone Encounter (Signed)
[email or web address] x1 

## 2012-05-29 ENCOUNTER — Telehealth: Payer: Self-pay | Admitting: Internal Medicine

## 2012-05-29 MED ORDER — PREDNISONE 20 MG PO TABS: 20.0000 mg | ORAL_TABLET | Freq: Every day | ORAL | Status: AC

## 2012-05-29 MED ORDER — PROMETHAZINE-CODEINE 6.25-10 MG/5ML PO SYRP: 5.0000 mL | ORAL_SOLUTION | Freq: Four times a day (QID) | ORAL | Status: DC | PRN

## 2012-05-29 NOTE — Telephone Encounter (Signed)
Pt's wife called & stated that she is wanting to pick up the cough syrup.  Cell is [phone] home is [phone]. Antionette Fairy

## 2012-05-29 NOTE — Telephone Encounter (Signed)
Called pt's spouse and her phone was breaking up.  I called and spoke with the pt. He states since last ov with CDY his cough has been worse, and is prod with moderate amount of white sputum. He is also c/o increased SOB that it progressively worse since last ov. He denies any CP, wheeze, f/c/s. Would like refill on prometh cough syrup. Please advise thanks! No Known Allergies

## 2012-05-29 NOTE — Telephone Encounter (Signed)
Rxs were called to pharmacy and pt made aware. He states nothing further needed.

## 2012-05-29 NOTE — Telephone Encounter (Signed)
Per CY-okay to give Promethazine codeine cough syrup #361ml 1 tsp every 6 hours prn no refills and Prednisone 20 mg #15 take 1 daily x 2 week trail no refills

## 2012-05-29 NOTE — Telephone Encounter (Signed)
Caller: Anita/Spouse; PCP: Allena Earing); CB#: [phone]; ; ; Call regarding Follow-Up From ED Visit On 7-4; DX with Ventral Hernia at [facility]. ED advised Pt to follow-up w/ PCP.  Pt is on muscle relaxer with no complaints at this time.  No sxs to assess.  Pt would like to ask Dr Artist Pais where should he go for Hernia issues.  PLEASE F/U W/ PT.

## 2012-05-29 NOTE — Telephone Encounter (Signed)
lmomtcb  

## 2012-06-01 ENCOUNTER — Telehealth: Payer: Self-pay | Admitting: Internal Medicine

## 2012-06-01 ENCOUNTER — Emergency Department (HOSPITAL_COMMUNITY)
Admission: EM | Admit: 2012-06-01 | Discharge: 2012-06-01 | Disposition: A | Discharge status: Home / Self Care | Payer: Medicare PPO | Attending: Emergency Medicine | Admitting: Emergency Medicine

## 2012-06-01 ENCOUNTER — Ambulatory Visit: Payer: Medicare PPO | Admitting: Internal Medicine

## 2012-06-01 ENCOUNTER — Encounter (HOSPITAL_COMMUNITY): Payer: Self-pay | Admitting: Emergency Medicine

## 2012-06-01 ENCOUNTER — Emergency Department (HOSPITAL_COMMUNITY): Payer: Medicare PPO

## 2012-06-01 DIAGNOSIS — Z79899 Other long term (current) drug therapy: Secondary | ICD-10-CM | POA: Insufficient documentation

## 2012-06-01 DIAGNOSIS — R1032 Left lower quadrant pain: Secondary | ICD-10-CM | POA: Insufficient documentation

## 2012-06-01 DIAGNOSIS — I2510 Atherosclerotic heart disease of native coronary artery without angina pectoris: Secondary | ICD-10-CM | POA: Insufficient documentation

## 2012-06-01 DIAGNOSIS — K219 Gastro-esophageal reflux disease without esophagitis: Secondary | ICD-10-CM | POA: Insufficient documentation

## 2012-06-01 DIAGNOSIS — E785 Hyperlipidemia, unspecified: Secondary | ICD-10-CM | POA: Insufficient documentation

## 2012-06-01 DIAGNOSIS — Z8639 Personal history of other endocrine, nutritional and metabolic disease: Secondary | ICD-10-CM | POA: Insufficient documentation

## 2012-06-01 DIAGNOSIS — I10 Essential (primary) hypertension: Secondary | ICD-10-CM | POA: Insufficient documentation

## 2012-06-01 DIAGNOSIS — F329 Major depressive disorder, single episode, unspecified: Secondary | ICD-10-CM | POA: Insufficient documentation

## 2012-06-01 DIAGNOSIS — R109 Unspecified abdominal pain: Secondary | ICD-10-CM

## 2012-06-01 DIAGNOSIS — F3289 Other specified depressive episodes: Secondary | ICD-10-CM | POA: Insufficient documentation

## 2012-06-01 DIAGNOSIS — J449 Chronic obstructive pulmonary disease, unspecified: Secondary | ICD-10-CM | POA: Insufficient documentation

## 2012-06-01 DIAGNOSIS — J4489 Other specified chronic obstructive pulmonary disease: Secondary | ICD-10-CM | POA: Insufficient documentation

## 2012-06-01 DIAGNOSIS — I739 Peripheral vascular disease, unspecified: Secondary | ICD-10-CM | POA: Insufficient documentation

## 2012-06-01 DIAGNOSIS — G4733 Obstructive sleep apnea (adult) (pediatric): Secondary | ICD-10-CM | POA: Insufficient documentation

## 2012-06-01 DIAGNOSIS — Z862 Personal history of diseases of the blood and blood-forming organs and certain disorders involving the immune mechanism: Secondary | ICD-10-CM | POA: Insufficient documentation

## 2012-06-01 HISTORY — DX: Essential (primary) hypertension: I10

## 2012-06-01 LAB — CBC WITH DIFFERENTIAL/PLATELET
Eosinophils Relative: 3 % (ref 0–5)
HCT: 46.9 % (ref 39.0–52.0)
Lymphocytes Relative: 16 % (ref 12–46)
Lymphs Abs: 2.1 10*3/uL (ref 0.7–4.0)
MCV: 88.7 fL (ref 78.0–100.0)
Monocytes Absolute: 1.1 10*3/uL — ABNORMAL HIGH (ref 0.1–1.0)
Neutro Abs: 9.5 10*3/uL — ABNORMAL HIGH (ref 1.7–7.7)
RBC: 5.29 MIL/uL (ref 4.22–5.81)
WBC: 13.1 10*3/uL — ABNORMAL HIGH (ref 4.0–10.5)

## 2012-06-01 LAB — BASIC METABOLIC PANEL
CO2: 28 mEq/L (ref 19–32)
Calcium: 9.2 mg/dL (ref 8.4–10.5)
Chloride: 99 mEq/L (ref 96–112)
Creatinine, Ser: 0.79 mg/dL (ref 0.50–1.35)
Glucose, Bld: 109 mg/dL — ABNORMAL HIGH (ref 70–99)
Sodium: 138 mEq/L (ref 135–145)

## 2012-06-01 MED ORDER — OXYCODONE-ACETAMINOPHEN 5-325 MG PO TABS: 1.0000 | ORAL_TABLET | Freq: Four times a day (QID) | ORAL | Status: AC | PRN

## 2012-06-01 MED ORDER — OXYCODONE-ACETAMINOPHEN 5-325 MG PO TABS
1.0000 | ORAL_TABLET | Freq: Once | ORAL | Status: AC
  Administered 2012-06-01: 1 via ORAL
  Filled 2012-06-01: qty 1

## 2012-06-01 MED ORDER — MORPHINE SULFATE 4 MG/ML IJ SOLN
4.0000 mg | Freq: Once | INTRAMUSCULAR | Status: AC
  Administered 2012-06-01: 4 mg via INTRAVENOUS
  Filled 2012-06-01: qty 1

## 2012-06-01 MED ORDER — ONDANSETRON HCL 4 MG/2ML IJ SOLN
4.0000 mg | Freq: Once | INTRAMUSCULAR | Status: AC
  Administered 2012-06-01: 4 mg via INTRAVENOUS
  Filled 2012-06-01: qty 2

## 2012-06-01 MED ORDER — HYDROMORPHONE HCL PF 1 MG/ML IJ SOLN
1.0000 mg | Freq: Once | INTRAMUSCULAR | Status: AC
  Administered 2012-06-01: 1 mg via INTRAVENOUS
  Filled 2012-06-01: qty 1

## 2012-06-01 MED ORDER — IOHEXOL 300 MG/ML  SOLN
100.0000 mL | Freq: Once | INTRAMUSCULAR | Status: AC | PRN
  Administered 2012-06-01: 100 mL via INTRAVENOUS

## 2012-06-01 MED ORDER — SODIUM CHLORIDE 0.9 % IV SOLN
INTRAVENOUS | Status: DC
  Administered 2012-06-01: 15:00:00 via INTRAVENOUS

## 2012-06-01 MED ORDER — IOHEXOL 300 MG/ML  SOLN
20.0000 mL | INTRAMUSCULAR | Status: AC
  Administered 2012-06-01 (×2): 20 mL via ORAL

## 2012-06-01 MED ORDER — ONDANSETRON HCL 4 MG PO TABS: 4.0000 mg | ORAL_TABLET | Freq: Four times a day (QID) | ORAL | Status: AC

## 2012-06-01 NOTE — ED Notes (Signed)
CT made aware pt ready and finished drinking contrast

## 2012-06-01 NOTE — ED Provider Notes (Signed)
56 year old male has been having left lower quadrant pain for the last week. He was seen at [facility] yesterday and diagnosed with a ventral hernia. He continues to have severe pain and was advised by his PCP to come to the emergency department. On exam, he has severe tenderness in the left lower quadrant but I cannot identify any definite hernia. There is no palpable inguinal hernia. CT scan has been ordered.  Dione Booze, MD 06/01/12 1550

## 2012-06-01 NOTE — ED Provider Notes (Signed)
History     CSN: [id number]  Arrival date & time 06/01/12  1125   First MD Initiated Contact with Patient 06/01/12 1401      Chief Complaint  Patient presents with  . Hernia    (Consider location/radiation/quality/duration/timing/severity/associated sxs/prior treatment) HPI  Pt to the ER with complaints of LLQ pain for 1 week. He was seen at [facility] and discharged with Valium. Today his PCP Dr. Artist Pais called him and told him that he needed to come to the ER to be evaluated. The patient has had normal bowel movements and has not been vomiting. He is very tender and no position makes the pain better. HE says that the valium have not been helping his discomfort. He denies weakness, fevers, dizziness or history of abdominal surgeries.   Past Medical History  Diagnosis Date  . CAD (coronary artery disease)     Left Main 30% stenosis, LAD 20 - 30 % stenosis, first and second diagonal branchesat 40 - 50%  stenosis with small arteries, circumflex had 30% stenosis in the large obtuse marginal, RCA at 70 - 80%  stenosis [not felt to be occlusive after evaluation with flow wire], distal 50 - 60% stenosis - [name][  . COPD (chronic obstructive pulmonary disease)     Dr. Jetty Duhamel  . Depression   . Anxiety   . Hyperlipidemia   . Chronic insomnia   . OSA (obstructive sleep apnea)     NPSG 09/10/10- AHI 11.3/hr  . Gout   . GERD (gastroesophageal reflux disease)   . PVD (peripheral vascular disease)     (ABI 0.9 on right and 0.89 on left)  severe left external iliac stenosis.  He  had successful stenting of his left external iliac per Dr. Excell Seltzer.  . DDD (degenerative disc disease)   . Hx of colonoscopy   . Hypertension     Past Surgical History  Procedure Date  . Spinal fusion 03/05/2007    L4-L5  . Hip surgery     left  . Arm surgery     left  . Shoulder surgery     right  . C-spine surgery     Family History  Problem Relation Age of Onset  . Heart attack  Mother   . Heart attack Sister   . Emphysema Sister     History  Substance Use Topics  . Smoking status: Current Everyday Smoker -- 0.2 packs/day for 43 years    Types: Cigarettes  . Smokeless tobacco: Never Used   Comment: On Chantix.  Quit date 04/12/11 smokes 3 cigarettes daily  . Alcohol Use: No      Review of Systems   HEENT: denies blurry vision or change in hearing PULMONARY: Denies difficulty breathing and SOB CARDIAC: denies chest pain or heart palpitations MUSCULOSKELETAL:  denies being unable to ambulate ABDOMEN AL: denies diarrhea or vomiting GU: denies loss of bowel or urinary control NEURO: denies numbness and tingling in extremities SKIN: no new rashes PSYCH: patient denies anxiety or depression. NECK: Pt denies having neck pain     Allergies  Review of patient's allergies indicates no known allergies.  Home Medications   Current Outpatient Rx  Name Route Sig Dispense Refill  . ALBUTEROL SULFATE HFA 108 (90 BASE) MCG/ACT IN AERS Inhalation Inhale 2 puffs into the lungs 4 (four) times daily as needed for wheezing. 1 Inhaler prn  . ALBUTEROL SULFATE (2.5 MG/3ML) 0.083% IN NEBU  USE 1 VIAL IN NEBULIZER EVERY 4 HOURS  AS NEEDED FOR WHEEZING OR SHORTNESS OF BREATH 75 mL 11  . AMLODIPINE BESYLATE 10 MG PO TABS Oral Take 1 tablet (10 mg total) by mouth daily. 30 tablet 7  . ATORVASTATIN CALCIUM 20 MG PO TABS  TAKE 1 TABLET (20 MG TOTAL) BY MOUTH DAILY. 30 tablet 6  . BUDESONIDE 180 MCG/ACT IN AEPB Inhalation Inhale 1 puff into the lungs 2 (two) times daily.      [name] CARVEDILOL 25 MG PO TABS Oral Take 25 mg by mouth 2 (two) times daily with a meal.      . CLONAZEPAM 1 MG PO TABS Oral Take 1 mg by mouth at bedtime as needed. For anxiety/insomnia    . CYCLOBENZAPRINE HCL 10 MG PO TABS Oral Take 10 mg by mouth 3 (three) times daily as needed. For pain    . ESOMEPRAZOLE MAGNESIUM 40 MG PO CPDR Oral Take 40 mg by mouth at bedtime.    . FLUOXETINE HCL 40 MG PO CAPS Oral  Take 40 mg by mouth daily.      [name] FORADIL AEROLIZER 12 MCG IN CAPS  PLACE 1 CAPSULE (12 MCG TOTAL) INTO INHALER AND INHALE 2 (TWO) TIMES DAILY. 60 each 5  . MIRTAZAPINE 45 MG PO TABS  Take 1/2 tablet at bedtime    . MODAFINIL 200 MG PO TABS Oral Take 200 mg by mouth daily.      . MORPHINE SULFATE ER 60 MG PO TB12 Oral Take 60 mg by mouth 2 (two) times daily.      . COMPRESSOR/NEBULIZER MISC Does not apply 1 Device by Does not apply route as directed. 1 each 0  . FISH OIL CONCENTRATE 1000 MG PO CAPS Oral Take 1 capsule by mouth 3 (three) times daily.      . OXYCODONE-ACETAMINOPHEN 5-325 MG PO TABS Oral Take 1 tablet by mouth every 4 (four) hours as needed. For pain    . PREDNISONE 20 MG PO TABS Oral Take 1 tablet (20 mg total) by mouth daily. 14 tablet 0  . PROMETHAZINE-CODEINE 6.25-10 MG/5ML PO SYRP Oral Take 5 mLs by mouth every 6 (six) hours as needed for cough. 300 mL 0  . ONDANSETRON HCL 4 MG PO TABS Oral Take 1 tablet (4 mg total) by mouth every 6 (six) hours. 12 tablet 0  . OXYCODONE-ACETAMINOPHEN 5-325 MG PO TABS Oral Take 1 tablet by mouth every 6 (six) hours as needed for pain. 15 tablet 0    BP 116/59  Pulse 87  Temp 98.4 F (36.9 C) (Oral)  Resp 16  SpO2 95%  Physical Exam  Nursing note and vitals reviewed. Constitutional: He appears well-developed and well-nourished. No distress.  HENT:  Head: Normocephalic and atraumatic.  Eyes: Pupils are equal, round, and reactive to light.  Neck: Normal range of motion. Neck supple.  Cardiovascular: Normal rate and regular rhythm.   Pulmonary/Chest: Effort normal.  Abdominal: Soft.         Pt very tender LLQ. No hernia palpated.  Neurological: He is alert.  Skin: Skin is warm and dry.    ED Course  Procedures (including critical care time)  Labs Reviewed  CBC WITH DIFFERENTIAL - Abnormal; Notable for the following:    WBC 13.1 (*)     Platelets 121 (*)     Neutro Abs 9.5 (*)     Monocytes Absolute 1.1 (*)     All other  components within normal limits  BASIC METABOLIC PANEL - Abnormal; Notable for the following:  Glucose, Bld 109 (*)     All other components within normal limits   Ct Abdomen Pelvis W Contrast  06/01/2012  *RADIOLOGY REPORT*  Clinical Data: Left lower quadrant pain for 1 week  CT ABDOMEN AND PELVIS WITH CONTRAST  Technique:  Multidetector CT imaging of the abdomen and pelvis was performed following the standard protocol during bolus administration of intravenous contrast.  Contrast: OMNIPAQUE IOHEXOL 300 MG/ML  SOLN, 1 OMNIPAQUE IOHEXOL 300 MG/ML  SOLN  Comparison: CT abdomen and pelvis of 06/04/2007  Findings: Mild bibasilar linear atelectasis is present.  The heart is mildly enlarged.  The liver is diffusely low attenuation consistent with fatty infiltration with areas of sparing.  No ductal dilatation is seen.  No calcified gallstones are noted.  The pancreas is normal in size and the pancreatic duct is not dilated. The adrenal glands and spleen are unremarkable, with the low attenuation in the inferior aspect of the spleen appearing stable most consistent with a cyst.  The stomach is not well distended with no abnormality noted.  The kidneys enhance with no calculus or mass noted.  The abdominal aorta is normal in caliber with moderate atheromatous change present.  No adenopathy is seen.  The urinary bladder is urine distended with no abnormality noted. The prostate is within normal limits in size.  There are rectosigmoid colonic diverticula present but no diverticulitis is seen.  Diverticula also are present throughout the descending colon.  The terminal ileum is unremarkable.  The appendix is well seen and appears normal.  No fluid is noted within the pelvis.  The probable lipoma in the ascending colon noted previously is stable. Hardware for fusion at L4-5 is noted with apparent solid bony fusion present.  IMPRESSION:  1.  There are diverticula in the rectosigmoid colon but no present evidence of  diverticulitis is seen. 2.  The appendix and terminal ileum are unremarkable. 3.  Fatty infiltration of the liver with areas of sparing.  Original Report Authenticated By: Juline Patch, M.D.     1. Abdominal pain       MDM  Dr. Preston Fleeting has evaluated the patient as well. The patients CT scan do not show any acute abnormalities or a reason for his pain. We think perhaps it is shingles or the beginning of an acute process. I have discussed with the patient the option to stay and be admitted to hospital or to be discharged. The patient chooses to go home. I have given him strict return to ED guidelines which he states his understanding. I have discontinued his Valium and started him on Percocet instead. He has been referred to Dr. Russella Dar with GI, Dr. Artist Pais his PCP and can return to hte ED if needed.  Pt has been advised of the symptoms that warrant their return to the ED. Patient has voiced understanding and has agreed to follow-up with the PCP or specialist.         Dorthula Matas, PA 06/01/12 1931

## 2012-06-01 NOTE — Telephone Encounter (Signed)
Spoke to Dr. Artist Pais and he advised that he go to [facility] ER now and to give referral to Dr. Manus Rudd with [facility]. Wife contacted @ [phone] and she will leave work to go take him.

## 2012-06-01 NOTE — Telephone Encounter (Signed)
Caller: Anita/Spouse; PCP: Allena Earing); CB#: [phone]; Call regarding Seen in ER on 05/28/12 in [place] for abdominal pain and Dx With Ventral Hernia; He has cough- given breathing treatment, Prednisone and cough syrup and pain meds. He is using Nebulizer-  twice daily. He has red, tender area on L side of abdomen that is warm to touch. Afebrile today but had fever yesterday. Nauseous last night, no vomiting. He did have normal bm this morning. He has not eaten yet today- has heartburn. He is having trouble getting comfortable. Slept on cough- cannot lay flat.  NEEDS REFERRAL TO SURGEON FOR HERNIA REPAIR/ DR. Orson Slick did Colon resection 10 years ago. ED Disposition for "generalized abdominal pain that progresses to localized pain AND ...unable to carry out normal activities."  Spoke to Kief, Charity fundraiser in office and she suggested I speak with Dr. Artist Pais- awaiting call back.

## 2012-06-01 NOTE — Telephone Encounter (Signed)
Pt went to ER per Dr Olegario Messier instructions

## 2012-06-01 NOTE — ED Notes (Signed)
Pt. Stated, its been going on for a week , I started coughing and I've strained myself real bad. I went to [facility] AND WAS GIVEN PAPERS TO COME HERE.

## 2012-06-01 NOTE — ED Notes (Signed)
Pt return from CT.

## 2012-06-03 NOTE — ED Provider Notes (Signed)
Medical screening examination/treatment/procedure(s) were conducted as a shared visit with non-physician practitioner(s) and myself.  I personally evaluated the patient during the encounter   Dione Booze, MD 06/03/12 1450

## 2012-06-04 ENCOUNTER — Encounter: Payer: Self-pay | Admitting: Internal Medicine

## 2012-06-04 ENCOUNTER — Ambulatory Visit (INDEPENDENT_AMBULATORY_CARE_PROVIDER_SITE_OTHER): Payer: Medicare PPO | Admitting: Internal Medicine

## 2012-06-04 VITALS — BP 150/88 | HR 80 | Temp 98.5°F | Wt 211.0 lb

## 2012-06-04 DIAGNOSIS — J449 Chronic obstructive pulmonary disease, unspecified: Secondary | ICD-10-CM

## 2012-06-04 DIAGNOSIS — IMO0002 Reserved for concepts with insufficient information to code with codable children: Secondary | ICD-10-CM

## 2012-06-04 DIAGNOSIS — S39011A Strain of muscle, fascia and tendon of abdomen, initial encounter: Secondary | ICD-10-CM

## 2012-06-04 MED ORDER — METHOCARBAMOL 500 MG PO TABS: 500.0000 mg | ORAL_TABLET | Freq: Three times a day (TID) | ORAL | Status: AC | PRN

## 2012-06-04 MED ORDER — DOXYCYCLINE HYCLATE 100 MG PO TABS: 100.0000 mg | ORAL_TABLET | Freq: Two times a day (BID) | ORAL | Status: AC

## 2012-06-04 NOTE — Assessment & Plan Note (Signed)
I suspect patient's symptoms perform partial strain versus tear of lower abdominal muscles. Symptoms precipitated by protracted cough and mild fall. I advised use of muscle relaxers and abdominal binder. Patient understands it may take 4-6 weeks for resolution.  Patient advised to call office if symptoms persist or worsen.

## 2012-06-04 NOTE — Assessment & Plan Note (Signed)
Patient experiencing COPD exacerbation. Continue maintenance inhalers. Add doxycycline 100 mg twice a day.

## 2012-06-04 NOTE — Progress Notes (Signed)
Subjective:    Patient ID: Ernest [name], male    DOB: [birth year], 56 y.o.   MRN: [id number]  HPI  56 year old white male with history of severe COPD, chronic back pain, and hypertension for emergency room followup. He was seen at [facility] approximately 2 weeks ago for severe left lower abdominal discomfort. His symptoms started after protracted coughing episode. His symptoms also worsened after suffering a minor fall on July 5th. Patient reports when he fell and felt like his insides were being ripped out.  He was diagnosed with possible ventral hernia and sent home with pain medication. Patient continued to have severe discomfort and was also seen by emergency room physicians in [place].  CT of abdomen and pelvis was obtained. It was notable for mild bibasilar linear atelectasis. He has diverticula throughout the descending colon.  No sign of diverticulosis. Otherwise appendix and terminal ileum normal. No free fluid noted in the pelvis.  COPD - chronic cough is worse.  Cough is productive of brown sputum.  Emergency room records reviewed.  Review of Systems Negative for changes in bowel habits, negative for fever or chills.  Past Medical History  Diagnosis Date  . CAD (coronary artery disease)     Left Main 30% stenosis, LAD 20 - 30 % stenosis, first and second diagonal branchesat 40 - 50%  stenosis with small arteries, circumflex had 30% stenosis in the large obtuse marginal, RCA at 70 - 80%  stenosis [not felt to be occlusive after evaluation with flow wire], distal 50 - 60% stenosis - [name][  . COPD (chronic obstructive pulmonary disease)     Dr. Jetty Duhamel  . Depression   . Anxiety   . Hyperlipidemia   . Chronic insomnia   . OSA (obstructive sleep apnea)     NPSG 09/10/10- AHI 11.3/hr  . Gout   . GERD (gastroesophageal reflux disease)   . PVD (peripheral vascular disease)     (ABI 0.9 on right and 0.89 on left)  severe left external iliac  stenosis.  He  had successful stenting of his left external iliac per Dr. Excell Seltzer.  . DDD (degenerative disc disease)   . Hx of colonoscopy   . Hypertension     History   Social History  . Marital Status: Married    Spouse Name: N/A    Number of Children: N/A  . Years of Education: N/A   Occupational History  . Disabled welder    Social History Main Topics  . Smoking status: Current Everyday Smoker -- 0.2 packs/day for 43 years    Types: Cigarettes  . Smokeless tobacco: Never Used   Comment: On Chantix.  Quit date 04/12/11 smokes 3 cigarettes daily  . Alcohol Use: No  . Drug Use: No  . Sexually Active: No   Other Topics Concern  . Not on file   Social History Narrative  . No narrative on file    Past Surgical History  Procedure Date  . Spinal fusion 03/05/2007    L4-L5  . Hip surgery     left  . Arm surgery     left  . Shoulder surgery     right  . C-spine surgery     Family History  Problem Relation Age of Onset  . Heart attack Mother   . Heart attack Sister   . Emphysema Sister     No Known Allergies  Current Outpatient Prescriptions on File Prior to Visit  Medication Sig Dispense Refill  .  albuterol (PROVENTIL HFA;VENTOLIN HFA) 108 (90 BASE) MCG/ACT inhaler Inhale 2 puffs into the lungs 4 (four) times daily as needed for wheezing.  1 Inhaler  prn  . albuterol (PROVENTIL) (2.5 MG/3ML) 0.083% nebulizer solution USE 1 VIAL IN NEBULIZER EVERY 4 HOURS AS NEEDED FOR WHEEZING OR SHORTNESS OF BREATH  75 mL  11  . amLODipine (NORVASC) 10 MG tablet Take 1 tablet (10 mg total) by mouth daily.  30 tablet  7  . atorvastatin (LIPITOR) 20 MG tablet TAKE 1 TABLET (20 MG TOTAL) BY MOUTH DAILY.  30 tablet  6  . budesonide (PULMICORT FLEXHALER) 180 MCG/ACT inhaler Inhale 1 puff into the lungs 2 (two) times daily.        . carvedilol (COREG) 25 MG tablet Take 25 mg by mouth 2 (two) times daily with a meal.        . clonazePAM (KLONOPIN) 1 MG tablet Take 1 mg by mouth at  bedtime as needed. For anxiety/insomnia      . esomeprazole (NEXIUM) 40 MG capsule Take 40 mg by mouth at bedtime.      [name] FLUoxetine (PROZAC) 40 MG capsule Take 40 mg by mouth daily.        [name] FORADIL AEROLIZER 12 MCG capsule for inhaler PLACE 1 CAPSULE (12 MCG TOTAL) INTO INHALER AND INHALE 2 (TWO) TIMES DAILY.  60 each  5  . mirtazapine (REMERON) 45 MG tablet Take 1/2 tablet at bedtime      . modafinil (PROVIGIL) 200 MG tablet Take 200 mg by mouth daily.        [name] morphine (MS CONTIN) 60 MG 12 hr tablet Take 60 mg by mouth 2 (two) times daily.        . Nebulizers (COMPRESSOR/NEBULIZER) MISC 1 Device by Does not apply route as directed.  1 each  0  . Omega-3 Fatty Acids (FISH OIL CONCENTRATE) 1000 MG CAPS Take 1 capsule by mouth 3 (three) times daily.        . ondansetron (ZOFRAN) 4 MG tablet Take 1 tablet (4 mg total) by mouth every 6 (six) hours.  12 tablet  0  . oxyCODONE-acetaminophen (PERCOCET) 5-325 MG per tablet Take 1 tablet by mouth every 6 (six) hours as needed for pain.  15 tablet  0  . predniSONE (DELTASONE) 20 MG tablet Take 1 tablet (20 mg total) by mouth daily.  14 tablet  0  . promethazine-codeine (PHENERGAN WITH CODEINE) 6.25-10 MG/5ML syrup Take 5 mLs by mouth every 6 (six) hours as needed for cough.  300 mL  0    BP 150/88  Pulse 80  Temp 98.5 F (36.9 C) (Oral)  Wt 211 lb (95.709 kg)       Objective:   Physical Exam  Constitutional: He appears well-developed and well-nourished.  HENT:  Head: Normocephalic and atraumatic.  Cardiovascular: Normal rate, regular rhythm and normal heart sounds.   Pulmonary/Chest: Effort normal. He has wheezes.       Scattered coarse breath sounds especially right base  Abdominal: Soft.       Severe superficial tenderness of left inguinal area/left lower quadrant. No inguinal hernias  Genitourinary:       Normal testicular exam  Skin: Skin is warm and dry.  Psychiatric: He has a normal mood and affect. His behavior is normal.           Assessment & Plan:

## 2012-06-04 NOTE — Patient Instructions (Signed)
Obtain abdominal binder from medical supply store.

## 2012-06-17 ENCOUNTER — Encounter: Payer: Self-pay | Admitting: Internal Medicine

## 2012-06-17 ENCOUNTER — Ambulatory Visit (INDEPENDENT_AMBULATORY_CARE_PROVIDER_SITE_OTHER): Payer: Medicare PPO | Admitting: Internal Medicine

## 2012-06-17 VITALS — BP 144/78 | HR 86 | Temp 98.5°F | Wt 208.0 lb

## 2012-06-17 DIAGNOSIS — S39011A Strain of muscle, fascia and tendon of abdomen, initial encounter: Secondary | ICD-10-CM

## 2012-06-17 DIAGNOSIS — IMO0002 Reserved for concepts with insufficient information to code with codable children: Secondary | ICD-10-CM

## 2012-06-17 DIAGNOSIS — J449 Chronic obstructive pulmonary disease, unspecified: Secondary | ICD-10-CM

## 2012-06-17 MED ORDER — FLUTICASONE-SALMETEROL 250-50 MCG/DOSE IN AEPB: 1.0000 | INHALATION_SPRAY | Freq: Two times a day (BID) | RESPIRATORY_TRACT | Status: DC

## 2012-06-17 MED ORDER — PREDNISONE 10 MG PO TABS: ORAL_TABLET | ORAL | Status: DC

## 2012-06-17 NOTE — Patient Instructions (Signed)
Stop taking Pulmicort Stop taking Formoterol Fumarate (Foradil Aerolizer)

## 2012-06-17 NOTE — Progress Notes (Signed)
Subjective:    Patient ID: Ernest [name], male    DOB: 1956/01/19, 56 y.o.   MRN: [id number]  HPI  56 year old white male previously seen for severe lower abdominal muscle strain and COPD for followup. Patient has been using muscle relaxers and abdominal binder. His discomfort has eased up some.  COPD-he finished 10 day course of doxycycline. He still has intermittent cough. It is productive of whitish sputum. He denies fever or chills  Review of Systems    negative for chest pain or shortness of breath.  Past Medical History  Diagnosis Date  . CAD (coronary artery disease)     Left Main 30% stenosis, LAD 20 - 30 % stenosis, first and second diagonal branchesat 40 - 50%  stenosis with small arteries, circumflex had 30% stenosis in the large obtuse marginal, RCA at 70 - 80%  stenosis [not felt to be occlusive after evaluation with flow wire], distal 50 - 60% stenosis - [name][  . COPD (chronic obstructive pulmonary disease)     Dr. Jetty Duhamel  . Depression   . Anxiety   . Hyperlipidemia   . Chronic insomnia   . OSA (obstructive sleep apnea)     NPSG 09/10/10- AHI 11.3/hr  . Gout   . GERD (gastroesophageal reflux disease)   . PVD (peripheral vascular disease)     (ABI 0.9 on right and 0.89 on left)  severe left external iliac stenosis.  He  had successful stenting of his left external iliac per Dr. Excell Seltzer.  . DDD (degenerative disc disease)   . Hx of colonoscopy   . Hypertension     History   Social History  . Marital Status: Married    Spouse Name: N/A    Number of Children: N/A  . Years of Education: N/A   Occupational History  . Disabled welder    Social History Main Topics  . Smoking status: Current Everyday Smoker -- 0.2 packs/day for 43 years    Types: Cigarettes  . Smokeless tobacco: Never Used   Comment: On Chantix.  Quit date 04/12/11 smokes 3 cigarettes daily  . Alcohol Use: No  . Drug Use: No  . Sexually Active: No   Other Topics  Concern  . Not on file   Social History Narrative  . No narrative on file    Past Surgical History  Procedure Date  . Spinal fusion 03/05/2007    L4-L5  . Hip surgery     left  . Arm surgery     left  . Shoulder surgery     right  . C-spine surgery     Family History  Problem Relation Age of Onset  . Heart attack Mother   . Heart attack Sister   . Emphysema Sister     No Known Allergies  Current Outpatient Prescriptions on File Prior to Visit  Medication Sig Dispense Refill  . albuterol (PROVENTIL HFA;VENTOLIN HFA) 108 (90 BASE) MCG/ACT inhaler Inhale 2 puffs into the lungs 4 (four) times daily as needed for wheezing.  1 Inhaler  prn  . albuterol (PROVENTIL) (2.5 MG/3ML) 0.083% nebulizer solution USE 1 VIAL IN NEBULIZER EVERY 4 HOURS AS NEEDED FOR WHEEZING OR SHORTNESS OF BREATH  75 mL  11  . amLODipine (NORVASC) 10 MG tablet Take 1 tablet (10 mg total) by mouth daily.  30 tablet  7  . atorvastatin (LIPITOR) 20 MG tablet TAKE 1 TABLET (20 MG TOTAL) BY MOUTH DAILY.  30 tablet  6  .  carvedilol (COREG) 25 MG tablet Take 25 mg by mouth 2 (two) times daily with a meal.        . clonazePAM (KLONOPIN) 1 MG tablet Take 1 mg by mouth at bedtime as needed. For anxiety/insomnia      . esomeprazole (NEXIUM) 40 MG capsule Take 40 mg by mouth at bedtime.      [name] FLUoxetine (PROZAC) 40 MG capsule Take 40 mg by mouth daily.        . mirtazapine (REMERON) 45 MG tablet Take 1/2 tablet at bedtime      . modafinil (PROVIGIL) 200 MG tablet Take 200 mg by mouth daily.        [name] morphine (MS CONTIN) 60 MG 12 hr tablet Take 60 mg by mouth 2 (two) times daily.        . Nebulizers (COMPRESSOR/NEBULIZER) MISC 1 Device by Does not apply route as directed.  1 each  0  . Omega-3 Fatty Acids (FISH OIL CONCENTRATE) 1000 MG CAPS Take 1 capsule by mouth 3 (three) times daily.        . Fluticasone-Salmeterol (ADVAIR DISKUS) 250-50 MCG/DOSE AEPB Inhale 1 puff into the lungs 2 (two) times daily.  1 each  3     BP 144/78  Pulse 86  Temp 98.5 F (36.9 C) (Oral)  Wt 208 lb (94.348 kg)  SpO2 96%    Objective:   Physical Exam  Constitutional: He appears well-developed and well-nourished.  Cardiovascular: Normal rate, regular rhythm and normal heart sounds.   Pulmonary/Chest: Effort normal.       Scattered expiratory wheeze  Skin: Skin is warm and dry.      Assessment & Plan:

## 2012-06-17 NOTE — Assessment & Plan Note (Signed)
Patient has persistent cough and wheezing. He finished course of doxycycline for 10 days. Discontinue Pulmicort and Foradil Aerolizer. Switch to Advair 250/50 1 dose twice daily. Prescription for short prednisone taper also provided.

## 2012-06-17 NOTE — Assessment & Plan Note (Signed)
Improved.  Continue use of abdominal binder.

## 2012-07-13 ENCOUNTER — Encounter: Payer: Self-pay | Admitting: Internal Medicine

## 2012-07-13 ENCOUNTER — Ambulatory Visit (INDEPENDENT_AMBULATORY_CARE_PROVIDER_SITE_OTHER): Payer: Medicare PPO | Admitting: Internal Medicine

## 2012-07-13 VITALS — BP 138/72 | HR 72 | Ht 67.0 in | Wt 205.4 lb

## 2012-07-13 DIAGNOSIS — R911 Solitary pulmonary nodule: Secondary | ICD-10-CM

## 2012-07-13 DIAGNOSIS — F17200 Nicotine dependence, unspecified, uncomplicated: Secondary | ICD-10-CM

## 2012-07-13 DIAGNOSIS — J449 Chronic obstructive pulmonary disease, unspecified: Secondary | ICD-10-CM

## 2012-07-13 DIAGNOSIS — J984 Other disorders of lung: Secondary | ICD-10-CM

## 2012-07-13 NOTE — Progress Notes (Signed)
Patient ID: Ernest [name], male    DOB: 14-Jan-1956, 56 y.o.   MRN: [id number]  HPI 07/30/11- 56 yoM smoker followed for COPD/ bronchitis, OSA/ CPAP Last here 04/01/11- note reviewed. He had felt better on a trial of maintenance prednisone. He has felt more short of breath through the hot summer, with no acute event and without blood, chest pain or fever. Easy DOE around home. Has been sharing nebulizer with his wife, who now has a cold and we discussed this. Has been worse in last month, and expecially in recent rainy weather. Noting a little ankle swelling. Smothered trying to take a shower.  Only tolerating his CPAP about 3 nights/ week because he feels smothered, waking sore in anterior chest in the mornings.  Now down to only 2-3 cigarettes/ day and trying to stop.  CXR 08/14/10- Nl NAD PFT 05/26/08- FEV1/FVC 2.44/ 0.76, incr 19% w/ BD, R 0.59 ?DLCO 0.69  09/10/11- 56 yoM smoker followed for COPD/ bronchitis, OSA/ CPAP He reports feeling better "finally doing okay". He is down to just a couple of cigarettes a day. He is using CPAP regularly now and is comfortable with it. Alpha I antitrypsin Report came back normal "M. M." on 08/09/2011. Chest x-ray from 08/15/2011 showed stable changes of COPD. He has had flu vaccine.  11/12/11- 56 yoM smoker followed for COPD/ bronchitis, OSA/ CPAP Here with wife. Has had flu shot. He says he was doing well until 2 weeks ago. He again is coughing thick mucus which is difficult to clear. He depends on his metered inhaler and nebulizer to help clear his airways. Started Mucinex 3 days ago. Mucus is white. He denies fever, sore throat, chest pain, GI upset. Because of orthopnea he is sitting up to sleep for the past 2 nights.  01/14/12- 56 yoM smoker followed for COPD/ bronchitis, OSA/ CPAP Increase congested cough green to yellow sputum but he does not feel sick and denies fever. Sister who was a smoker recently died of lung cancer. Despite that and all of our  discussions, he still smokes a few cigarettes daily. We talked again about motivation to stop completely. Continues CPAP and Provigil one or 2 daily.  03/10/12  56 yoM smoker followed for COPD/ bronchitis, OSA/ CPAP Persistent bronchitic cough. Sputum is not purulent. He is "working on" his smoking habit but blames pollen for keeping him S. coughing now. Remains fully compliant with CPAP, all night every night. Still needs Provigil to help with residual daytime sleepiness as before.  05/12/12- 56 yoM smoker followed for COPD/ bronchitis, OSA/ CPAP Stays SOB all the time-even at rest; wheezing, coughing-productive-yellow in color, chest congestion, nasal congestion,. He is more interested today in smoking cessation which we discussed. He had quit for Daliresp after 10 days because of sustained nausea. He is taking all of his regular medicines and using his rescue inhaler several times a day. Off prednisone now for 1-2 months. CXR 01/23/12-  IMPRESSION:  No acute cardiopulmonary abnormality.  Original Report Authenticated By: Harley Hallmark, M.D.   07/13/12- 56 yoM smoker followed for COPD/ bronchitis, OSA/ CPAP, complicated by CAD Pt states increased sob,wheezing,productive cough x3 months. Still smoking with no serious effort to stop. His sister has lung cancer which has gotten his attention and he may be willing to try harder. Some days his breathing is better than others with no real trend. Coughing productive of white sputum with no blood. He has all of his medications currently.  Review of Systems-  see HPI Constitutional:   No-   weight loss, night sweats, fevers, chills,+ fatigue, lassitude. HEENT:   No-  headaches, difficulty swallowing, tooth/dental problems, sore throat,       No-  sneezing, itching, ear ache, nasal congestion, post nasal drip,  CV:   No chest pain, +orthopnea, PND,  Little swelling in lower extremities,  dizziness, palpitations Resp: +  shortness of breath with exertion  or at rest.             + productive cough,  + non-productive cough,  No-  coughing up of blood.              No-   change in color of mucus.  +some wheezing.   Skin: No-   rash or lesions. GI:  No-   heartburn, indigestion, abdominal pain, nausea, vomiting, GU:  MS:  No-   joint pain or swelling.   Neuro- nothing unusual  Psych:  No- change in mood or affect. No depression or anxiety.  No memory loss.    Objective:   Physical Exam General- Alert, Oriented, Affect-appropriate, Distress- none acute   Stocky/ overweight, full beard Skin- rash-none, lesions- none, excoriation- none. Tanned     Lymphadenopathy- none Head- atraumatic            Eyes- Gross vision intact, PERRLA, conjunctivae clear secretions            Ears- Hearing, canals- normal            Nose- Clear, No- Septal dev, mucus, polyps, erosion, perforation             Throat- Mallampati III , mucosa red , drainage- none, tonsils- atrophic, hoarse Neck- flexible , trachea midline, no stridor , thyroid nl, carotid no bruit Chest - symmetrical excursion , unlabored           Heart/CV- RRR , no murmur , no gallop  , no rub, nl s1 s2                           - JVD- none , edema-none, stasis changes- none, varices- none           Lung-+ distant /Coarse, unlabored,  +scattered wheeze, cough+ with deep breath , dullness-none, rub- none           Chest wall-  Abd-  Br/ Gen/ Rectal- Not done, not indicated Extrem- cyanosis- none, clubbing, none, atrophy- none, strength- nl. 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 Neuro- grossly intact to observation

## 2012-07-13 NOTE — Patient Instructions (Addendum)
Order  CT chest, noncontrast    Dx Hx of lung nodule,   Sample Arcapta inhaler   1 daily 24 hour bronchodilator

## 2012-07-16 ENCOUNTER — Ambulatory Visit (INDEPENDENT_AMBULATORY_CARE_PROVIDER_SITE_OTHER)
Admission: RE | Admit: 2012-07-16 | Discharge: 2012-07-16 | Disposition: A | Discharge status: Home / Self Care | Payer: Medicare PPO | Source: Ambulatory Visit | Attending: Internal Medicine | Admitting: Internal Medicine

## 2012-07-16 DIAGNOSIS — R911 Solitary pulmonary nodule: Secondary | ICD-10-CM

## 2012-07-19 NOTE — Assessment & Plan Note (Signed)
He is concerned now because of his ongoing smoking and his sister's diagnosis of lung cancer. Plan-CT chest for lung nodule update

## 2012-07-19 NOTE — Assessment & Plan Note (Signed)
Severe COPD with ongoing tobacco abuse. Medications are appropriate

## 2012-07-19 NOTE — Assessment & Plan Note (Signed)
We have reemphasized the importance of stopping smoking and available support measures, given his sister's diagnosis of lung cancer

## 2012-07-21 ENCOUNTER — Telehealth: Payer: Self-pay | Admitting: Internal Medicine

## 2012-07-21 MED ORDER — INDACATEROL MALEATE 75 MCG IN CAPS: 1.0000 | ORAL_CAPSULE | Freq: Every day | RESPIRATORY_TRACT | Status: DC

## 2012-07-21 NOTE — Telephone Encounter (Signed)
Pt's wife returned call.  Stated the arcapta given at last ov is working well for pt w/ decreased SOB and wheezing and is requesting a rx.  Would also like a refill on pt's albuterol neb soln and CT chest results.  Per pt's chart, 11 refills of the albuterol neb soln were sent June 2013.  Called [facility], spoke with Dorene Grebe who confirmed this stating that pt still has 9 refills left.  Arcapta sent electronically (okay'd per CY).  Pt's wife advised of CT chest results as stated by CY:  Result Notes     Notes Recorded by Waymon Budge, MD on 07/16/2012 at 1:07 PM CT chest- stable tiny nodules, thought to be small, benign lymph nodes. Atherosclerosis- hardening of the arteries. Small spot in spleen thought to be benign blood vessel spot. Nothing new or concerning, except that the ahterosclerosis may point to increased risk for heart attack, especially in a smoker.      Nothing further needed at this time; will sign off.

## 2012-07-21 NOTE — Telephone Encounter (Signed)
LMTCB

## 2012-07-28 ENCOUNTER — Telehealth: Payer: Self-pay | Admitting: Internal Medicine

## 2012-07-28 MED ORDER — AMOXICILLIN 500 MG PO CAPS: 500.0000 mg | ORAL_CAPSULE | Freq: Three times a day (TID) | ORAL | Status: DC

## 2012-07-28 NOTE — Telephone Encounter (Signed)
Acute onset of cough - wife asks antibiotic. Sending amoxacillin.

## 2012-07-29 ENCOUNTER — Telehealth: Payer: Self-pay | Admitting: Internal Medicine

## 2012-07-29 NOTE — Telephone Encounter (Signed)
Pt is going to take Mucinex OTC to keep mucus thinned and will keep appt for tomorrow at 9:00 with Dr Maple Hudson.

## 2012-07-30 ENCOUNTER — Encounter: Payer: Self-pay | Admitting: Internal Medicine

## 2012-07-30 ENCOUNTER — Ambulatory Visit (INDEPENDENT_AMBULATORY_CARE_PROVIDER_SITE_OTHER): Payer: Medicare PPO | Admitting: Internal Medicine

## 2012-07-30 VITALS — BP 138/70 | HR 100 | Ht 67.0 in | Wt 200.0 lb

## 2012-07-30 DIAGNOSIS — Z789 Other specified health status: Secondary | ICD-10-CM

## 2012-07-30 DIAGNOSIS — G4733 Obstructive sleep apnea (adult) (pediatric): Secondary | ICD-10-CM

## 2012-07-30 DIAGNOSIS — Z9189 Other specified personal risk factors, not elsewhere classified: Secondary | ICD-10-CM

## 2012-07-30 DIAGNOSIS — F17200 Nicotine dependence, unspecified, uncomplicated: Secondary | ICD-10-CM

## 2012-07-30 DIAGNOSIS — J4489 Other specified chronic obstructive pulmonary disease: Secondary | ICD-10-CM

## 2012-07-30 DIAGNOSIS — J449 Chronic obstructive pulmonary disease, unspecified: Secondary | ICD-10-CM

## 2012-07-30 MED ORDER — PREDNISONE 10 MG PO TABS: ORAL_TABLET | ORAL | Status: DC

## 2012-07-30 MED ORDER — LEVALBUTEROL HCL 0.63 MG/3ML IN NEBU
0.6300 mg | INHALATION_SOLUTION | Freq: Once | RESPIRATORY_TRACT | Status: AC
  Administered 2012-07-30: 0.63 mg via RESPIRATORY_TRACT

## 2012-07-30 MED ORDER — DOXYCYCLINE HYCLATE 100 MG PO TABS: ORAL_TABLET | ORAL | Status: AC

## 2012-07-30 MED ORDER — METHYLPREDNISOLONE ACETATE 80 MG/ML IJ SUSP
80.0000 mg | Freq: Once | INTRAMUSCULAR | Status: AC
  Administered 2012-07-30: 80 mg via INTRAMUSCULAR

## 2012-07-30 NOTE — Progress Notes (Signed)
Patient ID: Ernest [name], male    DOB: 03-Mar-1956, 56 y.o.   MRN: [id number]  HPI 07/30/11- 40 yoM smoker followed for COPD/ bronchitis, OSA/ CPAP Last here 04/01/11- note reviewed. He had felt better on a trial of maintenance prednisone. He has felt more short of breath through the hot summer, with no acute event and without blood, chest pain or fever. Easy DOE around home. Has been sharing nebulizer with his wife, who now has a cold and we discussed this. Has been worse in last month, and expecially in recent rainy weather. Noting a little ankle swelling. Smothered trying to take a shower.  Only tolerating his CPAP about 3 nights/ week because he feels smothered, waking sore in anterior chest in the mornings.  Now down to only 2-3 cigarettes/ day and trying to stop.  CXR 08/14/10- Nl NAD PFT 05/26/08- FEV1/FVC 2.44/ 0.76, incr 19% w/ BD, R 0.59 ?DLCO 0.69  09/10/11- 55 yoM smoker followed for COPD/ bronchitis, OSA/ CPAP He reports feeling better "finally doing okay". He is down to just a couple of cigarettes a day. He is using CPAP regularly now and is comfortable with it. Alpha I antitrypsin Report came back normal "M. M." on 08/09/2011. Chest x-ray from 08/15/2011 showed stable changes of COPD. He has had flu vaccine.  11/12/11- 46 yoM smoker followed for COPD/ bronchitis, OSA/ CPAP Here with wife. Has had flu shot. He says he was doing well until 2 weeks ago. He again is coughing thick mucus which is difficult to clear. He depends on his metered inhaler and nebulizer to help clear his airways. Started Mucinex 3 days ago. Mucus is white. He denies fever, sore throat, chest pain, GI upset. Because of orthopnea he is sitting up to sleep for the past 2 nights.  01/14/12- 55 yoM smoker followed for COPD/ bronchitis, OSA/ CPAP Increase congested cough green to yellow sputum but he does not feel sick and denies fever. Sister who was a smoker recently died of lung cancer. Despite that and all of our  discussions, he still smokes a few cigarettes daily. We talked again about motivation to stop completely. Continues CPAP and Provigil one or 2 daily.  03/10/12  56 yoM smoker followed for COPD/ bronchitis, OSA/ CPAP Persistent bronchitic cough. Sputum is not purulent. He is "working on" his smoking habit but blames pollen for keeping him S. coughing now. Remains fully compliant with CPAP, all night every night. Still needs Provigil to help with residual daytime sleepiness as before.  05/12/12- 56 yoM smoker followed for COPD/ bronchitis, OSA/ CPAP Stays SOB all the time-even at rest; wheezing, coughing-productive-yellow in color, chest congestion, nasal congestion,. He is more interested today in smoking cessation which we discussed. He had quit for Daliresp after 10 days because of sustained nausea. He is taking all of his regular medicines and using his rescue inhaler several times a day. Off prednisone now for 1-2 months. CXR 01/23/12-  IMPRESSION:  No acute cardiopulmonary abnormality.  Original Report Authenticated By: Randall An, M.D.   07/13/12- 45 yoM smoker followed for COPD/ bronchitis, OSA/ CPAP, complicated by CAD Pt states increased sob,wheezing,productive cough x3 months. Still smoking with no serious effort to stop. His sister has lung cancer which has gotten his attention and he may be willing to try harder. Some days his breathing is better than others with no real trend. Coughing productive of white sputum with no blood. He has all of his medications currently.  07/30/12- 10 yoM  smoker followed for COPD/ bronchitis, OSA/ CPAP, complicated by CAD Cough-productive-yellow and thick; SOB, wheezing, chest congestion, head congestion x 1 week; O2 sat of 83% RA entered room-20 seconds RA sat of 90% COPD assessment test (CAT) score 40/40. Acute exacerbation x1 week. He and wife are sharing a cold. We sent amoxicillin 2 days ago for yellow-green sputum and productive cough with some fever  and chilling for the last 2 nights. His nebulizer helps a little. Appetite is poor. CT 07/21/12-  IMPRESSION:  1. Tiny bilateral lung nodules which are similar to on the prior  exam, given differences in slice selection and measurement error.  Likely subpleural lymph nodes.  2. Age advanced coronary artery atherosclerosis. Recommend  assessment of coronary risk factors and consideration of medical  therapy.  3. Slight enlargement of splenic lesion since 2007. Favored to  represent a hemangioma or lymphangioma.  Original Report Authenticated By: Consuello Bossier, M.D.    Review of Systems- see HPI Constitutional:   No-   weight loss, night sweats, fevers, chills,+ fatigue, lassitude. HEENT:   No-  headaches, difficulty swallowing, tooth/dental problems, sore throat,       No-  sneezing, itching, ear ache, nasal congestion, post nasal drip,  CV:   No chest pain, +orthopnea, PND,  Little swelling in lower extremities,  dizziness, palpitations Resp: +  shortness of breath with exertion or at rest.             + productive cough,  + non-productive cough,  No-  coughing up of blood.              No-   change in color of mucus.  +some wheezing.   Skin: No-   rash or lesions. GI:  No-   heartburn, indigestion, abdominal pain, nausea, vomiting, GU:  MS:  No-   joint pain or swelling.   Neuro- + numbness left arm x3 days with history of C-spine surgery. Psych:  No- change in mood or affect. No depression or anxiety.  No memory loss.    Objective:   Physical Exam General- Alert, Oriented, Affect-appropriate, Distress- none acute   Stocky/ overweight, full beard Skin- rash-none, lesions- none, excoriation- none. +Tanned     Lymphadenopathy- none Head- atraumatic            Eyes- Gross vision intact, PERRLA, conjunctivae clear secretions            Ears- Hearing, canals- normal            Nose- Clear, No- Septal dev, mucus, polyps, erosion, perforation             Throat- Mallampati III ,  mucosa red and dry , drainage- none, tonsils- atrophic, hoarse Neck- flexible , trachea midline, no stridor , thyroid nl, carotid no bruit Chest - symmetrical excursion , unlabored           Heart/CV- RRR , no murmur , no gallop  , no rub, nl s1 s2                           - JVD- none , edema-none, stasis changes- none, varices- none           Lung-+ distant /Coarse, unlabored,  +scattered wheeze, cough+ with deep breath , dullness-none, rub- none           Chest wall-  Abd-  Br/ Gen/ Rectal- Not done, not indicated Extrem- cyanosis- none, clubbing, none,  atrophy- none, strength- nl. 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 Neuro- grossly intact to observation

## 2012-07-30 NOTE — Patient Instructions (Addendum)
Script sent for doxycycline- start now while continuing your amoxacillin  Script sent for prednisone taper- start now  Extra fluids for next several days. Mucinex may also help.  Please Don't smoke at all !!!  Order- refer back to Dr Antoine Poche Cardiology for coronary risk stratifying. CAD on recent CT scan.  Neb xop 0.63  Depo 80

## 2012-08-06 ENCOUNTER — Ambulatory Visit: Payer: Medicare PPO | Admitting: Cardiology

## 2012-08-07 ENCOUNTER — Encounter: Payer: Self-pay | Admitting: Internal Medicine

## 2012-08-07 ENCOUNTER — Ambulatory Visit (INDEPENDENT_AMBULATORY_CARE_PROVIDER_SITE_OTHER): Payer: Medicare PPO | Admitting: Internal Medicine

## 2012-08-07 VITALS — BP 130/52 | HR 74 | Temp 96.2°F | Ht 67.0 in | Wt 200.0 lb

## 2012-08-07 DIAGNOSIS — J449 Chronic obstructive pulmonary disease, unspecified: Secondary | ICD-10-CM

## 2012-08-07 DIAGNOSIS — J4489 Other specified chronic obstructive pulmonary disease: Secondary | ICD-10-CM

## 2012-08-07 DIAGNOSIS — F17200 Nicotine dependence, unspecified, uncomplicated: Secondary | ICD-10-CM

## 2012-08-07 DIAGNOSIS — G4733 Obstructive sleep apnea (adult) (pediatric): Secondary | ICD-10-CM

## 2012-08-07 DIAGNOSIS — Z23 Encounter for immunization: Secondary | ICD-10-CM

## 2012-08-07 MED ORDER — DOXYCYCLINE HYCLATE 100 MG PO TABS: ORAL_TABLET | ORAL | Status: DC

## 2012-08-07 MED ORDER — NICOTINE 21 MG/24HR TD PT24: 1.0000 | MEDICATED_PATCH | TRANSDERMAL | Status: DC

## 2012-08-07 MED ORDER — NICOTINE POLACRILEX 4 MG MT GUM: 4.0000 mg | CHEWING_GUM | OROMUCOSAL | Status: DC | PRN

## 2012-08-07 MED ORDER — METHYLPREDNISOLONE ACETATE 80 MG/ML IJ SUSP
80.0000 mg | Freq: Once | INTRAMUSCULAR | Status: AC
  Administered 2012-08-07: 80 mg via INTRAMUSCULAR

## 2012-08-07 MED ORDER — PROMETHAZINE-CODEINE 6.25-10 MG/5ML PO SYRP: 5.0000 mL | ORAL_SOLUTION | Freq: Four times a day (QID) | ORAL | Status: DC | PRN

## 2012-08-07 NOTE — Patient Instructions (Addendum)
Script sent for doxycycline and printed for cough syrup  Depo 80  Flu vax  It is really important that you stop smoking now and completely. Please go through with the smoking program.   Try throat lozenges and otc cough syrups like Delsym for coating to soothe your throat between doses of prescription cough syrup.  Keep December appointment unless needed sooner.

## 2012-08-07 NOTE — Progress Notes (Signed)
Patient ID: Ernest [name], male    DOB: 03-Mar-1956, 56 y.o.   MRN: [id number]  HPI 07/30/11- 40 yoM smoker followed for COPD/ bronchitis, OSA/ CPAP Last here 04/01/11- note reviewed. He had felt better on a trial of maintenance prednisone. He has felt more short of breath through the hot summer, with no acute event and without blood, chest pain or fever. Easy DOE around home. Has been sharing nebulizer with his wife, who now has a cold and we discussed this. Has been worse in last month, and expecially in recent rainy weather. Noting a little ankle swelling. Smothered trying to take a shower.  Only tolerating his CPAP about 3 nights/ week because he feels smothered, waking sore in anterior chest in the mornings.  Now down to only 2-3 cigarettes/ day and trying to stop.  CXR 08/14/10- Nl NAD PFT 05/26/08- FEV1/FVC 2.44/ 0.76, incr 19% w/ BD, R 0.59 ?DLCO 0.69  09/10/11- 55 yoM smoker followed for COPD/ bronchitis, OSA/ CPAP He reports feeling better "finally doing okay". He is down to just a couple of cigarettes a day. He is using CPAP regularly now and is comfortable with it. Alpha I antitrypsin Report came back normal "M. M." on 08/09/2011. Chest x-ray from 08/15/2011 showed stable changes of COPD. He has had flu vaccine.  11/12/11- 46 yoM smoker followed for COPD/ bronchitis, OSA/ CPAP Here with wife. Has had flu shot. He says he was doing well until 2 weeks ago. He again is coughing thick mucus which is difficult to clear. He depends on his metered inhaler and nebulizer to help clear his airways. Started Mucinex 3 days ago. Mucus is white. He denies fever, sore throat, chest pain, GI upset. Because of orthopnea he is sitting up to sleep for the past 2 nights.  01/14/12- 55 yoM smoker followed for COPD/ bronchitis, OSA/ CPAP Increase congested cough green to yellow sputum but he does not feel sick and denies fever. Sister who was a smoker recently died of lung cancer. Despite that and all of our  discussions, he still smokes a few cigarettes daily. We talked again about motivation to stop completely. Continues CPAP and Provigil one or 2 daily.  03/10/12  56 yoM smoker followed for COPD/ bronchitis, OSA/ CPAP Persistent bronchitic cough. Sputum is not purulent. He is "working on" his smoking habit but blames pollen for keeping him S. coughing now. Remains fully compliant with CPAP, all night every night. Still needs Provigil to help with residual daytime sleepiness as before.  05/12/12- 56 yoM smoker followed for COPD/ bronchitis, OSA/ CPAP Stays SOB all the time-even at rest; wheezing, coughing-productive-yellow in color, chest congestion, nasal congestion,. He is more interested today in smoking cessation which we discussed. He had quit for Daliresp after 10 days because of sustained nausea. He is taking all of his regular medicines and using his rescue inhaler several times a day. Off prednisone now for 1-2 months. CXR 01/23/12-  IMPRESSION:  No acute cardiopulmonary abnormality.  Original Report Authenticated By: Randall An, M.D.   07/13/12- 45 yoM smoker followed for COPD/ bronchitis, OSA/ CPAP, complicated by CAD Pt states increased sob,wheezing,productive cough x3 months. Still smoking with no serious effort to stop. His sister has lung cancer which has gotten his attention and he may be willing to try harder. Some days his breathing is better than others with no real trend. Coughing productive of white sputum with no blood. He has all of his medications currently.  07/30/12- 10 yoM  smoker followed for COPD/ bronchitis, OSA/ CPAP, complicated by CAD Cough-productive-yellow and thick; SOB, wheezing, chest congestion, head congestion x 1 week; O2 sat of 83% RA entered room-20 seconds RA sat of 90% CT 07/21/12-  IMPRESSION:  1. Tiny bilateral lung nodules which are similar to on the prior  exam, given differences in slice selection and measurement error.  Likely subpleural lymph  nodes.  2. Age advanced coronary artery atherosclerosis. Recommend  assessment of coronary risk factors and consideration of medical  therapy.  3. Slight enlargement of splenic lesion since 2007. Favored to  represent a hemangioma or lymphangioma.  Original Report Authenticated By: Consuello Bossier, M.D.   08/07/12- 47 yoM smoker followed for COPD/ bronchitis, OSA/ CPAP, complicated by CAD, disability for back pain. Cough-productive-white mostly; SOB , wheezing-worse at night     wife here Coughing spasms he can feel his upper airway is closing off. He he still smokes a little even when he feels badly and we went over this again today offering support.  Review of Systems- see HPI Constitutional:   No-   weight loss, night sweats, fevers, chills,+ fatigue, lassitude. HEENT:   No-  headaches, difficulty swallowing, tooth/dental problems, sore throat,       No-  sneezing, itching, ear ache, nasal congestion, post nasal drip,  CV:   No chest pain, +orthopnea, PND,  Little swelling in lower extremities,  dizziness, palpitations Resp: +  shortness of breath with exertion or at rest.             + productive cough,  + non-productive cough,  No-  coughing up of blood.              No-   change in color of mucus.  +some wheezing.   Skin: No-   rash or lesions. GI:  No-   heartburn, indigestion, abdominal pain, nausea, vomiting, GU:  MS:  No-   joint pain or swelling.   Neuro- nothing unusual  Psych:  No- change in mood or affect. No depression or anxiety.  No memory loss.    Objective:   Physical Exam General- Alert, Oriented, Affect-appropriate, Distress- none acute   Stocky/ overweight, full beard Skin- rash-none, lesions- none, excoriation- none. Tanned     Lymphadenopathy- none Head- atraumatic            Eyes- Gross vision intact, PERRLA, conjunctivae clear secretions            Ears- Hearing, canals- normal            Nose- Clear, No- Septal dev, mucus, polyps, erosion, perforation              Throat- Mallampati III , mucosa red , drainage- none, tonsils- atrophic, hoarse Neck- flexible , trachea midline, no stridor , thyroid nl, carotid no bruit Chest - symmetrical excursion , unlabored           Heart/CV- RRR , no murmur , no gallop  , no rub, nl s1 s2                           - JVD- none , edema-none, stasis changes- none, varices- none           Lung-+ distant /Coarse, unlabored,  +scattered wheeze, + barking cough , dullness-none, rub- none           Chest wall-  Abd-  Br/ Gen/ Rectal- Not done, not indicated Extrem- cyanosis-  none, clubbing, none, atrophy- none, strength- nl. 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 Neuro- grossly intact to observation

## 2012-08-08 NOTE — Assessment & Plan Note (Signed)
I have really leaned on his obvious uncomfortable symptoms and urgent need to stop smoking. We have reinforced many previous conversations about available support and encouragement. Discussed electronic cigarettes as an alternative.

## 2012-08-08 NOTE — Assessment & Plan Note (Signed)
He remains compliant with his CPAP but is having to take it off to cough frequently now at night

## 2012-08-08 NOTE — Assessment & Plan Note (Signed)
Subacute exacerbation with ongoing bronchitic cough Plan-nebulizer Xopenex 0.63, Depo-Medrol, prednisone taper with steroid talk, doxycycline. He may need home oxygen again. I encouraged him to maintain followup with cardiology because of potential symptom overlap in a smoker

## 2012-08-16 NOTE — Assessment & Plan Note (Signed)
Continues CPAP every night except when cough prevents.

## 2012-08-16 NOTE — Assessment & Plan Note (Signed)
Persistent active COPD with bronchitis. He is rarely clear for a sustained period now and we have discussed progression of his disease, smoking cessation and available help. Plan-flu vaccine, doxycycline, Depo-Medrol, Delsym

## 2012-08-16 NOTE — Assessment & Plan Note (Signed)
Plan-discussed role of electronic cigarettes, [facility] smoking cessation program

## 2012-08-18 ENCOUNTER — Encounter: Payer: Self-pay | Admitting: Internal Medicine

## 2012-08-18 ENCOUNTER — Ambulatory Visit (INDEPENDENT_AMBULATORY_CARE_PROVIDER_SITE_OTHER): Payer: Medicare PPO | Admitting: Internal Medicine

## 2012-08-18 VITALS — BP 134/76 | HR 76 | Temp 97.9°F | Wt 193.0 lb

## 2012-08-18 DIAGNOSIS — J449 Chronic obstructive pulmonary disease, unspecified: Secondary | ICD-10-CM

## 2012-08-18 MED ORDER — PREDNISONE 20 MG PO TABS: ORAL_TABLET | ORAL | Status: DC

## 2012-08-18 MED ORDER — LEVOFLOXACIN 500 MG PO TABS: 500.0000 mg | ORAL_TABLET | Freq: Every day | ORAL | Status: DC

## 2012-08-18 NOTE — Assessment & Plan Note (Addendum)
Patient having ersistent symptoms despite course of doxycycline and Depo-Medrol. Treat with Levaquin 500 mg once daily for 10 days. Also add prednisone taper. Continue to use Delsym for cough.  Reassess in 2 weeks

## 2012-08-18 NOTE — Progress Notes (Signed)
Subjective:    Patient ID: Ernest [name], male    DOB: [birth year], 56 y.o.   MRN: [id number]  HPI  56 year old white male with history of severe COPD complains of chronic cough, chest congestion and head congestion that has been worse over last 3 -4 weeks. Patient seen twice by his pulmonologist in September. He was treated with course of doxycycline and given Depo-Medrol injection. Despite taking doxycycline, patient has persistent productive cough with yellowish sputum.  Patient denies fevers or chills. He has mild dyspnea on exertion.  Review of Systems See HPI  Past Medical History  Diagnosis Date  . CAD (coronary artery disease)     Left Main 30% stenosis, LAD 20 - 30 % stenosis, first and second diagonal branchesat 40 - 50%  stenosis with small arteries, circumflex had 30% stenosis in the large obtuse marginal, RCA at 70 - 80%  stenosis [not felt to be occlusive after evaluation with flow wire], distal 50 - 60% stenosis - [name][  . COPD (chronic obstructive pulmonary disease)     Dr. Jetty Duhamel  . Depression   . Anxiety   . Hyperlipidemia   . Chronic insomnia   . OSA (obstructive sleep apnea)     NPSG 09/10/10- AHI 11.3/hr  . Gout   . GERD (gastroesophageal reflux disease)   . PVD (peripheral vascular disease)     (ABI 0.9 on right and 0.89 on left)  severe left external iliac stenosis.  He  had successful stenting of his left external iliac per Dr. Excell Seltzer.  . DDD (degenerative disc disease)   . Hx of colonoscopy   . Hypertension     History   Social History  . Marital Status: Married    Spouse Name: N/A    Number of Children: N/A  . Years of Education: N/A   Occupational History  . Disabled welder    Social History Main Topics  . Smoking status: Current Every Day Smoker -- 0.2 packs/day for 43 years    Types: Cigarettes  . Smokeless tobacco: Never Used   Comment: On Chantix.  Quit date 04/12/11 smokes 3 cigarettes daily  . Alcohol Use: No    . Drug Use: No  . Sexually Active: No   Other Topics Concern  . Not on file   Social History Narrative  . No narrative on file    Past Surgical History  Procedure Date  . Spinal fusion 03/05/2007    L4-L5  . Hip surgery     left  . Arm surgery     left  . Shoulder surgery     right  . C-spine surgery     Family History  Problem Relation Age of Onset  . Heart attack Mother   . Heart attack Sister   . Emphysema Sister     No Known Allergies  Current Outpatient Prescriptions on File Prior to Visit  Medication Sig Dispense Refill  . ABILIFY 5 MG tablet Take 5 mg by mouth daily.       [name] albuterol (PROVENTIL HFA;VENTOLIN HFA) 108 (90 BASE) MCG/ACT inhaler Inhale 2 puffs into the lungs 4 (four) times daily as needed for wheezing.  1 Inhaler  prn  . albuterol (PROVENTIL) (2.5 MG/3ML) 0.083% nebulizer solution USE 1 VIAL IN NEBULIZER EVERY 4 HOURS AS NEEDED FOR WHEEZING OR SHORTNESS OF BREATH  75 mL  11  . amLODipine (NORVASC) 10 MG tablet Take 1 tablet (10 mg total) by mouth daily.  30 tablet  7  . atorvastatin (LIPITOR) 20 MG tablet TAKE 1 TABLET (20 MG TOTAL) BY MOUTH DAILY.  30 tablet  6  . carvedilol (COREG) 25 MG tablet Take 25 mg by mouth 2 (two) times daily with a meal.        . clonazePAM (KLONOPIN) 1 MG tablet Take 1 mg by mouth at bedtime as needed. For anxiety/insomnia      . esomeprazole (NEXIUM) 40 MG capsule Take 40 mg by mouth at bedtime.      [name] FLUoxetine (PROZAC) 40 MG capsule Take 40 mg by mouth daily.        . Fluticasone-Salmeterol (ADVAIR DISKUS) 250-50 MCG/DOSE AEPB Inhale 1 puff into the lungs 2 (two) times daily.  1 each  3  . Indacaterol Maleate (ARCAPTA NEOHALER) 75 MCG CAPS Place 1 capsule into inhaler and inhale daily.  30 capsule  5  . mirtazapine (REMERON) 45 MG tablet Take 1/2 tablet at bedtime      . modafinil (PROVIGIL) 200 MG tablet Take 200 mg by mouth daily.        [name] morphine (MS CONTIN) 60 MG 12 hr tablet Take 60 mg by mouth 2 (two) times  daily.        . Nebulizers (COMPRESSOR/NEBULIZER) MISC 1 Device by Does not apply route as directed.  1 each  0  . nicotine (NICODERM CQ) 21 mg/24hr patch Place 1 patch onto the skin daily.  9 patch  0  . nicotine polacrilex (NICORETTE) 4 MG gum Take 1 each (4 mg total) by mouth as needed for smoking cessation.  20 tablet  0  . Omega-3 Fatty Acids (FISH OIL CONCENTRATE) 1000 MG CAPS Take 1 capsule by mouth 3 (three) times daily.        [name] oxyCODONE-acetaminophen (PERCOCET) 10-325 MG per tablet Prn pain      . VOLTAREN 1 % GEL         BP 134/76  Pulse 76  Temp 97.9 F (36.6 C) (Oral)  Wt 193 lb (87.544 kg)       Objective:   Physical Exam  Constitutional: He is oriented to person, place, and time. He appears well-developed and well-nourished.  Cardiovascular: Regular rhythm and normal heart sounds.   Pulmonary/Chest: Effort normal.       Faint expiratory wheeze predominantly in the right mid and lower lung fields  Neurological: He is alert and oriented to person, place, and time.  Skin: Skin is warm and dry.          Assessment & Plan:

## 2012-09-01 ENCOUNTER — Encounter: Payer: Self-pay | Admitting: Internal Medicine

## 2012-09-01 ENCOUNTER — Ambulatory Visit (INDEPENDENT_AMBULATORY_CARE_PROVIDER_SITE_OTHER): Payer: Medicare PPO | Admitting: Internal Medicine

## 2012-09-01 ENCOUNTER — Ambulatory Visit (INDEPENDENT_AMBULATORY_CARE_PROVIDER_SITE_OTHER): Payer: Medicare PPO | Admitting: Cardiology

## 2012-09-01 ENCOUNTER — Encounter: Payer: Self-pay | Admitting: Cardiology

## 2012-09-01 VITALS — BP 152/90 | HR 68 | Temp 98.1°F | Wt 199.0 lb

## 2012-09-01 VITALS — BP 172/77 | HR 73 | Ht 67.0 in | Wt 198.4 lb

## 2012-09-01 DIAGNOSIS — I739 Peripheral vascular disease, unspecified: Secondary | ICD-10-CM

## 2012-09-01 DIAGNOSIS — E7800 Pure hypercholesterolemia, unspecified: Secondary | ICD-10-CM

## 2012-09-01 DIAGNOSIS — I2510 Atherosclerotic heart disease of native coronary artery without angina pectoris: Secondary | ICD-10-CM

## 2012-09-01 DIAGNOSIS — J449 Chronic obstructive pulmonary disease, unspecified: Secondary | ICD-10-CM

## 2012-09-01 DIAGNOSIS — I10 Essential (primary) hypertension: Secondary | ICD-10-CM

## 2012-09-01 MED ORDER — CARVEDILOL 25 MG PO TABS: 25.0000 mg | ORAL_TABLET | Freq: Two times a day (BID) | ORAL | Status: DC

## 2012-09-01 MED ORDER — LOSARTAN POTASSIUM 25 MG PO TABS: 25.0000 mg | ORAL_TABLET | Freq: Every day | ORAL | Status: DC

## 2012-09-01 MED ORDER — ROFLUMILAST 500 MCG PO TABS: 500.0000 ug | ORAL_TABLET | Freq: Every day | ORAL | Status: DC

## 2012-09-01 NOTE — Assessment & Plan Note (Signed)
56 year old with history of chronic COPD. He has frequent exacerbations and suffers from chronic cough. Trial of Daliresp 500 mcg once daily.  Strongly encouraged complete tobacco cessation.  Discussed various strategies to quit smoking.

## 2012-09-01 NOTE — Patient Instructions (Addendum)
Please start Cozaar (losartan) 25 mg a day.  Continue all other medications as listed.  Please return in 2 weeks for a fasting lipid panel and a basic metabolic panel.  Your physician has requested that you have a lexiscan myoview. For further information please visit [email or web address] Please follow instruction sheet, as given.  Follow up after your testing with Dr Antoine Poche.

## 2012-09-01 NOTE — Progress Notes (Signed)
Subjective:    Patient ID: Ernest [name], male    DOB: February 06, 1956, 56 y.o.   MRN: [id number]  HPI  56 year old white male with history of severe COPD and chronic tobacco use for follow up regarding acute bronchitis. Patient reports since finishing course of Levaquin and prednisone taper he is feeling much better. His cough has decreased significantly. However patient has been experiencing chronic cough for years. He has decreased his cigarette use to 3 cigarettes per day. He mainly smokes after meals.  He is using his maintenance inhalers as directed.  Review of Systems Chronic cough, no shortness of breath  Past Medical History  Diagnosis Date  . CAD (coronary artery disease)     Left Main 30% stenosis, LAD 20 - 30 % stenosis, first and second diagonal branchesat 40 - 50%  stenosis with small arteries, circumflex had 30% stenosis in the large obtuse marginal, RCA at 70 - 80%  stenosis [not felt to be occlusive after evaluation with flow wire], distal 50 - 60% stenosis - [name][  . COPD (chronic obstructive pulmonary disease)     Dr. Jetty Duhamel  . Depression   . Anxiety   . Hyperlipidemia   . Chronic insomnia   . OSA (obstructive sleep apnea)     NPSG 09/10/10- AHI 11.3/hr  . Gout   . GERD (gastroesophageal reflux disease)   . PVD (peripheral vascular disease)     (ABI 0.9 on right and 0.89 on left)  severe left external iliac stenosis.  He  had successful stenting of his left external iliac per Dr. Excell Seltzer.  . DDD (degenerative disc disease)   . Hx of colonoscopy   . Hypertension     History   Social History  . Marital Status: Married    Spouse Name: N/A    Number of Children: N/A  . Years of Education: N/A   Occupational History  . Disabled welder    Social History Main Topics  . Smoking status: Current Every Day Smoker -- 0.2 packs/day for 43 years    Types: Cigarettes  . Smokeless tobacco: Never Used   Comment: On Chantix.  Quit date 04/12/11  smokes 3 cigarettes daily  . Alcohol Use: No  . Drug Use: No  . Sexually Active: No   Other Topics Concern  . Not on file   Social History Narrative  . No narrative on file    Past Surgical History  Procedure Date  . Spinal fusion 03/05/2007    L4-L5  . Hip surgery     left  . Arm surgery     left  . Shoulder surgery     right  . C-spine surgery     Family History  Problem Relation Age of Onset  . Heart attack Mother   . Heart attack Sister   . Emphysema Sister     No Known Allergies  Current Outpatient Prescriptions on File Prior to Visit  Medication Sig Dispense Refill  . ABILIFY 5 MG tablet Take 5 mg by mouth daily.       [name] albuterol (PROVENTIL HFA;VENTOLIN HFA) 108 (90 BASE) MCG/ACT inhaler Inhale 2 puffs into the lungs 4 (four) times daily as needed for wheezing.  1 Inhaler  prn  . albuterol (PROVENTIL) (2.5 MG/3ML) 0.083% nebulizer solution USE 1 VIAL IN NEBULIZER EVERY 4 HOURS AS NEEDED FOR WHEEZING OR SHORTNESS OF BREATH  75 mL  11  . amLODipine (NORVASC) 10 MG tablet Take 1 tablet (10 mg  total) by mouth daily.  30 tablet  7  . atorvastatin (LIPITOR) 20 MG tablet TAKE 1 TABLET (20 MG TOTAL) BY MOUTH DAILY.  30 tablet  6  . carvedilol (COREG) 25 MG tablet Take 1 tablet (25 mg total) by mouth 2 (two) times daily with a meal.  60 tablet  11  . clonazePAM (KLONOPIN) 1 MG tablet Take 1 mg by mouth at bedtime as needed. For anxiety/insomnia      . esomeprazole (NEXIUM) 40 MG capsule Take 40 mg by mouth at bedtime.      [name] FLUoxetine (PROZAC) 40 MG capsule Take 40 mg by mouth daily.        . Fluticasone-Salmeterol (ADVAIR DISKUS) 250-50 MCG/DOSE AEPB Inhale 1 puff into the lungs 2 (two) times daily.  1 each  3  . Indacaterol Maleate (ARCAPTA NEOHALER) 75 MCG CAPS Place 1 capsule into inhaler and inhale daily.  30 capsule  5  . losartan (COZAAR) 25 MG tablet Take 1 tablet (25 mg total) by mouth daily.  30 tablet  11  . mirtazapine (REMERON) 45 MG tablet Take 1/2 tablet  at bedtime      . modafinil (PROVIGIL) 200 MG tablet Take 200 mg by mouth daily.        [name] morphine (MS CONTIN) 60 MG 12 hr tablet Take 60 mg by mouth 2 (two) times daily.        . Nebulizers (COMPRESSOR/NEBULIZER) MISC 1 Device by Does not apply route as directed.  1 each  0  . nicotine (NICODERM CQ) 21 mg/24hr patch Place 1 patch onto the skin daily.  9 patch  0  . nicotine polacrilex (NICORETTE) 4 MG gum Take 1 each (4 mg total) by mouth as needed for smoking cessation.  20 tablet  0  . Omega-3 Fatty Acids (FISH OIL CONCENTRATE) 1000 MG CAPS Take 1 capsule by mouth 3 (three) times daily.        . predniSONE (DELTASONE) 20 MG tablet One tab twice daily for 4 days then 1/2 tab twice daily for 4 days, then 1/2 tab for 4 days  14 tablet  0  . VOLTAREN 1 % GEL once a week.       [name] DISCONTD: carvedilol (COREG) 25 MG tablet Take 25 mg by mouth 2 (two) times daily with a meal.        . DISCONTD: carvedilol (COREG) 25 MG tablet Take 1 tablet (25 mg total) by mouth 2 (two) times daily with a meal.  60 tablet  11    BP 152/90  Pulse 68  Temp 98.1 F (36.7 C) (Oral)  Wt 199 lb (90.266 kg)       Objective:   Physical Exam  Constitutional: He appears well-developed and well-nourished.  Cardiovascular: Normal rate, regular rhythm and normal heart sounds.   Pulmonary/Chest: Effort normal.       Faint expiratory wheeze right lung field  Abdominal: Bowel sounds are normal.  Musculoskeletal: He exhibits no edema.  Skin: Skin is warm and dry.  Psychiatric: He has a normal mood and affect. His behavior is normal.          Assessment & Plan:

## 2012-09-01 NOTE — Progress Notes (Signed)
HPI The patient presents for follow of known coronary disease and peripheral vascular disease. Since I last saw him he has occasionally needed some sublingual nitroglycerin about 3 times per year. However, in the last 3 months he's had a different kind of discomfort. This is dull. It is in his mid chest. It happens sporadically. It might be somewhat different than his previous. He may or may not take a nitroglycerin for it. He's not describing radiation to his jaw or to his arms. He's not describing associated nausea vomiting or diaphoresis. He's had no palpitations, presyncope or syncope. He unfortunately continues to smoke cigarettes. He does have dyspnea with exertion which is chronic. He's not describing PND or orthopnea however.  No Known Allergies  Current Outpatient Prescriptions  Medication Sig Dispense Refill  . ABILIFY 5 MG tablet Take 5 mg by mouth daily.       [name] albuterol (PROVENTIL HFA;VENTOLIN HFA) 108 (90 BASE) MCG/ACT inhaler Inhale 2 puffs into the lungs 4 (four) times daily as needed for wheezing.  1 Inhaler  prn  . albuterol (PROVENTIL) (2.5 MG/3ML) 0.083% nebulizer solution USE 1 VIAL IN NEBULIZER EVERY 4 HOURS AS NEEDED FOR WHEEZING OR SHORTNESS OF BREATH  75 mL  11  . amLODipine (NORVASC) 10 MG tablet Take 1 tablet (10 mg total) by mouth daily.  30 tablet  7  . atorvastatin (LIPITOR) 20 MG tablet TAKE 1 TABLET (20 MG TOTAL) BY MOUTH DAILY.  30 tablet  6  . carvedilol (COREG) 25 MG tablet Take 25 mg by mouth 2 (two) times daily with a meal.        . clonazePAM (KLONOPIN) 1 MG tablet Take 1 mg by mouth at bedtime as needed. For anxiety/insomnia      . esomeprazole (NEXIUM) 40 MG capsule Take 40 mg by mouth at bedtime.      [name] FLUoxetine (PROZAC) 40 MG capsule Take 40 mg by mouth daily.        . Fluticasone-Salmeterol (ADVAIR DISKUS) 250-50 MCG/DOSE AEPB Inhale 1 puff into the lungs 2 (two) times daily.  1 each  3  . Indacaterol Maleate (ARCAPTA NEOHALER) 75 MCG CAPS Place 1  capsule into inhaler and inhale daily.  30 capsule  5  . mirtazapine (REMERON) 45 MG tablet Take 1/2 tablet at bedtime      . modafinil (PROVIGIL) 200 MG tablet Take 200 mg by mouth daily.        [name] morphine (MS CONTIN) 60 MG 12 hr tablet Take 60 mg by mouth 2 (two) times daily.        . Nebulizers (COMPRESSOR/NEBULIZER) MISC 1 Device by Does not apply route as directed.  1 each  0  . nicotine (NICODERM CQ) 21 mg/24hr patch Place 1 patch onto the skin daily.  9 patch  0  . nicotine polacrilex (NICORETTE) 4 MG gum Take 1 each (4 mg total) by mouth as needed for smoking cessation.  20 tablet  0  . Omega-3 Fatty Acids (FISH OIL CONCENTRATE) 1000 MG CAPS Take 1 capsule by mouth 3 (three) times daily.        . predniSONE (DELTASONE) 20 MG tablet One tab twice daily for 4 days then 1/2 tab twice daily for 4 days, then 1/2 tab for 4 days  14 tablet  0  . VOLTAREN 1 % GEL         Past Medical History  Diagnosis Date  . CAD (coronary artery disease)     Left  Main 30% stenosis, LAD 20 - 30 % stenosis, first and second diagonal branchesat 40 - 50%  stenosis with small arteries, circumflex had 30% stenosis in the large obtuse marginal, RCA at 70 - 80%  stenosis [not felt to be occlusive after evaluation with flow wire], distal 50 - 60% stenosis - [facility][  . COPD (chronic obstructive pulmonary disease)     Dr. Jetty Duhamel  . Depression   . Anxiety   . Hyperlipidemia   . Chronic insomnia   . OSA (obstructive sleep apnea)     NPSG 09/10/10- AHI 11.3/hr  . Gout   . GERD (gastroesophageal reflux disease)   . PVD (peripheral vascular disease)     (ABI 0.9 on right and 0.89 on left)  severe left external iliac stenosis.  He  had successful stenting of his left external iliac per Dr. Excell Seltzer.  . DDD (degenerative disc disease)   . Hx of colonoscopy   . Hypertension     Past Surgical History  Procedure Date  . Spinal fusion 03/05/2007    L4-L5  . Hip surgery     left  . Arm surgery      left  . Shoulder surgery     right  . C-spine surgery     ROS:  Claudication.  Otherwise as stated in the HPI and negative for all other systems.  PHYSICAL EXAM BP 172/77  Pulse 73  Ht 5\' 7"  (1.702 m)  Wt 89.994 kg (198 lb 6.4 oz)  BMI 31.07 kg/m2 GENERAL:  Well appearing HEENT:  Pupils equal round and reactive, fundi not visualized, oral mucosa unremarkable, dentures NECK:  No jugular venous distention, waveform within normal limits, carotid upstroke brisk and symmetric, no bruits, no thyromegaly LYMPHATICS:  No cervical, inguinal adenopathy LUNGS:  Course rhonchi  bilaterally BACK:  No CVA tenderness CHEST:  Unremarkable HEART:  PMI not displaced or sustained,S1 and S2 within normal limits, no S3, no S4, no clicks, no rubs, no murmurs ABD:  Flat, positive bowel sounds normal in frequency in pitch, no bruits, no rebound, no guarding, no midline pulsatile mass, no hepatomegaly, no splenomegaly EXT:  2 plus pulses upper, DP/PT left 2 plus, absent DP/PT right, no edema, no cyanosis no clubbing SKIN:  No rashes no nodules NEURO:  Cranial nerves II through XII grossly intact, motor grossly intact throughout PSYCH:  Cognitively intact, oriented to person place and time   EKG:  Sinus rhythm, rate 73, axis within normal limits, intervals within normal limits, no acute ST-T wave changes.  09/01/2012   ASSESSMENT AND PLAN  CORONARY ATHEROSCLEROSIS NATIVE CORONARY ARTERY -  He has disease as described above. He has some increased symptoms. At this point he needs screening stress test but would not be a able to walk on a treadmill. Therefore, he will have a [employer].  HYPERTENSION -  He has been out of his beta blocker. Even before this if his blood pressure was running high. I will Cozaar 25 mg daily and check a basic metabolic profile in 2 weeks.   UNSPECIFIED PERIPHERAL VASCULAR DISEASE -  He is having no new symptoms since his ABI most recently. No further imaging or testing  is indicated.   PURE HYPERCHOLESTEROLEMIA -  I will check a fasting lipid profile when he returned with a goal LDL less than 100 and HDL greater then 40.  TOBACCO USER -  He unfortunately cannot stop smoking and we discussed this at length.

## 2012-09-04 ENCOUNTER — Emergency Department (HOSPITAL_COMMUNITY): Payer: Medicare PPO

## 2012-09-04 ENCOUNTER — Encounter (HOSPITAL_COMMUNITY): Payer: Self-pay | Admitting: *Deleted

## 2012-09-04 ENCOUNTER — Emergency Department (HOSPITAL_COMMUNITY)
Admission: EM | Admit: 2012-09-04 | Discharge: 2012-09-04 | Disposition: A | Discharge status: Home / Self Care | Payer: Medicare PPO | Attending: Emergency Medicine | Admitting: Emergency Medicine

## 2012-09-04 ENCOUNTER — Other Ambulatory Visit: Payer: Self-pay

## 2012-09-04 DIAGNOSIS — I10 Essential (primary) hypertension: Secondary | ICD-10-CM | POA: Insufficient documentation

## 2012-09-04 DIAGNOSIS — F17200 Nicotine dependence, unspecified, uncomplicated: Secondary | ICD-10-CM | POA: Insufficient documentation

## 2012-09-04 DIAGNOSIS — Z79899 Other long term (current) drug therapy: Secondary | ICD-10-CM | POA: Insufficient documentation

## 2012-09-04 DIAGNOSIS — I2510 Atherosclerotic heart disease of native coronary artery without angina pectoris: Secondary | ICD-10-CM | POA: Insufficient documentation

## 2012-09-04 DIAGNOSIS — J441 Chronic obstructive pulmonary disease with (acute) exacerbation: Secondary | ICD-10-CM | POA: Insufficient documentation

## 2012-09-04 LAB — CBC
HCT: 44.8 % (ref 39.0–52.0)
Hemoglobin: 15.2 g/dL (ref 13.0–17.0)
MCV: 87.7 fL (ref 78.0–100.0)
RBC: 5.11 MIL/uL (ref 4.22–5.81)
RDW: 15.2 % (ref 11.5–15.5)
WBC: 18.7 10*3/uL — ABNORMAL HIGH (ref 4.0–10.5)

## 2012-09-04 LAB — BASIC METABOLIC PANEL
BUN: 11 mg/dL (ref 6–23)
CO2: 28 mEq/L (ref 19–32)
Chloride: 97 mEq/L (ref 96–112)
Creatinine, Ser: 0.82 mg/dL (ref 0.50–1.35)
Glucose, Bld: 115 mg/dL — ABNORMAL HIGH (ref 70–99)

## 2012-09-04 MED ORDER — ALBUTEROL (5 MG/ML) CONTINUOUS INHALATION SOLN
15.0000 mg/h | INHALATION_SOLUTION | RESPIRATORY_TRACT | Status: DC
  Administered 2012-09-04: 15 mg/h via RESPIRATORY_TRACT

## 2012-09-04 MED ORDER — PREDNISONE 10 MG PO TABS: 60.0000 mg | ORAL_TABLET | Freq: Every day | ORAL | Status: AC

## 2012-09-04 MED ORDER — ALBUTEROL SULFATE (5 MG/ML) 0.5% IN NEBU
INHALATION_SOLUTION | RESPIRATORY_TRACT | Status: AC
  Filled 2012-09-04: qty 3

## 2012-09-04 MED ORDER — ALBUTEROL (5 MG/ML) CONTINUOUS INHALATION SOLN
10.0000 mg/h | INHALATION_SOLUTION | Freq: Once | RESPIRATORY_TRACT | Status: AC
  Administered 2012-09-04: 10 mg/h via RESPIRATORY_TRACT
  Filled 2012-09-04: qty 0.5

## 2012-09-04 NOTE — ED Notes (Signed)
Coming from home; sob since 10 am, take home nebs without relief; ems: very diminished lung sds. After given albuterol/atrovent, wheezing in lt. Lung fields, and rt. Lung fields diminished. Initial o2 was 70%,

## 2012-09-04 NOTE — ED Notes (Signed)
Pt states he is feeling "much better" wife remains at bedside

## 2012-09-04 NOTE — ED Provider Notes (Signed)
History     CSN: [id number]  Arrival date & time 09/04/12  1731   None     Chief Complaint  Patient presents with  . Shortness of Breath    (Consider location/radiation/quality/duration/timing/severity/associated sxs/prior treatment) Patient is a 56 y.o. male presenting with shortness of breath. The history is provided by the patient.  Shortness of Breath  The current episode started today. The problem occurs continuously. The problem has been gradually improving. The problem is severe. The symptoms are relieved by beta-agonist inhalers. The symptoms are aggravated by activity. Associated symptoms include cough, shortness of breath and wheezing. Pertinent negatives include no chest pain, no chest pressure and no fever. He has inhaled smoke recently. He has had intermittent steroid use. He has had no prior hospitalizations. Recently, medical care has been given by the PCP. Services received include medications given.    Past Medical History  Diagnosis Date  . CAD (coronary artery disease)     Left Main 30% stenosis, LAD 20 - 30 % stenosis, first and second diagonal branchesat 40 - 50%  stenosis with small arteries, circumflex had 30% stenosis in the large obtuse marginal, RCA at 70 - 80%  stenosis [not felt to be occlusive after evaluation with flow wire], distal 50 - 60% stenosis - [name][  . COPD (chronic obstructive pulmonary disease)     Dr. Jetty Duhamel  . Depression   . Anxiety   . Hyperlipidemia   . Chronic insomnia   . OSA (obstructive sleep apnea)     NPSG 09/10/10- AHI 11.3/hr  . Gout   . GERD (gastroesophageal reflux disease)   . PVD (peripheral vascular disease)     (ABI 0.9 on right and 0.89 on left)  severe left external iliac stenosis.  He  had successful stenting of his left external iliac per Dr. Excell Seltzer.  . DDD (degenerative disc disease)   . Hx of colonoscopy   . Hypertension     Past Surgical History  Procedure Date  . Spinal fusion 03/05/2007    L4-L5  . Hip surgery     left  . Arm surgery     left  . Shoulder surgery     right  . C-spine surgery   . Angioplasty     Family History  Problem Relation Age of Onset  . Heart attack Mother   . Heart attack Sister   . Emphysema Sister     History  Substance Use Topics  . Smoking status: Current Every Day Smoker -- 0.2 packs/day for 43 years    Types: Cigarettes  . Smokeless tobacco: Never Used   Comment: On Chantix.  Quit date 04/12/11 smokes 3 cigarettes daily  . Alcohol Use: No      Review of Systems  Constitutional: Negative for fever.  Respiratory: Positive for cough, shortness of breath and wheezing.   Cardiovascular: Negative for chest pain.  Gastrointestinal: Negative for nausea, vomiting, abdominal pain and diarrhea.  Neurological: Negative for headaches.  All other systems reviewed and are negative.    Allergies  Review of patient's allergies indicates no known allergies.  Home Medications   Current Outpatient Rx  Name Route Sig Dispense Refill  . ABILIFY 5 MG PO TABS Oral Take 5 mg by mouth daily.     . ALBUTEROL SULFATE HFA 108 (90 BASE) MCG/ACT IN AERS Inhalation Inhale 2 puffs into the lungs 4 (four) times daily as needed for wheezing. 1 Inhaler prn  . ALBUTEROL SULFATE (2.5 MG/3ML) 0.083%  IN NEBU  USE 1 VIAL IN NEBULIZER EVERY 4 HOURS AS NEEDED FOR WHEEZING OR SHORTNESS OF BREATH 75 mL 11  . AMLODIPINE BESYLATE 10 MG PO TABS Oral Take 1 tablet (10 mg total) by mouth daily. 30 tablet 7  . ATORVASTATIN CALCIUM 20 MG PO TABS  TAKE 1 TABLET (20 MG TOTAL) BY MOUTH DAILY. 30 tablet 6  . CARVEDILOL 25 MG PO TABS Oral Take 1 tablet (25 mg total) by mouth 2 (two) times daily with a meal. 60 tablet 11  . CLONAZEPAM 1 MG PO TABS Oral Take 1 mg by mouth at bedtime as needed. For anxiety/insomnia    . ESOMEPRAZOLE MAGNESIUM 40 MG PO CPDR Oral Take 40 mg by mouth at bedtime.    . FLUOXETINE HCL 40 MG PO CAPS Oral Take 40 mg by mouth daily.      [name]  FLUTICASONE-SALMETEROL 250-50 MCG/DOSE IN AEPB Inhalation Inhale 1 puff into the lungs 2 (two) times daily. 1 each 3  . INDACATEROL MALEATE 75 MCG IN CAPS Inhalation Place 1 capsule into inhaler and inhale daily. 30 capsule 5  . LOSARTAN POTASSIUM 25 MG PO TABS Oral Take 1 tablet (25 mg total) by mouth daily. 30 tablet 11  . MIRTAZAPINE 45 MG PO TABS  Take 1/2 tablet at bedtime    . MODAFINIL 200 MG PO TABS Oral Take 200 mg by mouth daily.      . MORPHINE SULFATE ER 60 MG PO TB12 Oral Take 60 mg by mouth 2 (two) times daily.      [name] NICOTINE 21 MG/24HR TD PT24 Transdermal Place 1 patch onto the skin daily. 9 patch 0  . NICOTINE POLACRILEX 4 MG MT GUM Oral Take 1 each (4 mg total) by mouth as needed for smoking cessation. 20 tablet 0  . FISH OIL CONCENTRATE 1000 MG PO CAPS Oral Take 1 capsule by mouth 3 (three) times daily.      [name] PREDNISONE 20 MG PO TABS  One tab twice daily for 4 days then 1/2 tab twice daily for 4 days, then 1/2 tab for 4 days 14 tablet 0  . ROFLUMILAST 500 MCG PO TABS Oral Take 1 tablet (500 mcg total) by mouth daily. 30 tablet 1  . VOLTAREN 1 % TD GEL  once a week.       SpO2 98%  Physical Exam  Nursing note and vitals reviewed. Constitutional: He is oriented to person, place, and time. He appears well-developed and well-nourished. He appears distressed (mild respiratory distress).  HENT:  Head: Normocephalic and atraumatic.  Eyes: Pupils are equal, round, and reactive to light.  Cardiovascular: Normal rate and normal heart sounds.   Pulmonary/Chest: Accessory muscle usage present. Tachypnea noted. He is in respiratory distress. He has decreased breath sounds in the right upper field, the right middle field, the right lower field, the left upper field, the left middle field and the left lower field. He has wheezes in the right upper field, the right middle field, the right lower field, the left upper field, the left middle field and the left lower field.  Abdominal: Soft.  He exhibits no distension. There is no tenderness.  Musculoskeletal: Normal range of motion.  Neurological: He is alert and oriented to person, place, and time.  Skin: Skin is warm and dry.  Psychiatric: He has a normal mood and affect.    ED Course  Procedures (including critical care time)  Labs Reviewed  CBC - Abnormal; Notable for the following:  WBC 18.7 (*)     Platelets 126 (*)     All other components within normal limits  BASIC METABOLIC PANEL - Abnormal; Notable for the following:    Glucose, Bld 115 (*)     All other components within normal limits   Dg Chest Portable 1 View  09/04/2012  *RADIOLOGY REPORT*  Clinical Data: Shortness of breath.  COPD.  PORTABLE CHEST - 1 VIEW  Comparison: Multiple exams, including 07/16/2012  Findings: Mildly prominent epicardial adipose tissue noted along the cardiac apex.  No cardiomegaly.  The lungs appear clear.  IMPRESSION:  1.  No significant abnormality identified.   Original Report Authenticated By: Dellia Cloud, M.D.      1. COPD exacerbation   2. TOBACCO USER       MDM  5:41 PM Pt seen and examined upon arrival in ED. Pt with COPD. Pt with improved breath sounds from EMS report but still increase WOB. Will place on continuous, get basic labs and CXR. Feel this is likely flare of COPD.   9:35 PM Pt with improvement but not quite back to baseline, so will give second breathing treatment and then allow patient to go home. Feel that elevated WBC count related to intermittent steroid use (just got off not long ago). Do not feel that patient needs antibiotics at this time.  11:56 PM Pt finishing his second breathing treatment. Will send home on steroids and continued frequent albuterol use.   Daleen Bo, MD 09/04/12 [id number]

## 2012-09-04 NOTE — ED Provider Notes (Addendum)
Complains of shortness of breath typical of COPD onset this morning. Admits to slight cough no fever patient continues to smoke on exam speaks in sentences lungs scant diffuse rhonchi, coughing occasionally.  Date: 09/05/2012  Rate: 80  Rhythm: normal sinus rhythm  QRS Axis: normal  Intervals: normal  ST/T Wave abnormalities: nonspecific T wave changes  Conduction Disutrbances:none  Narrative Interpretation:   Old EKG Reviewed: No significant change from FEB. 6 2010 as interpreted by me   Doug Sou, MD 09/04/12 1610  Doug Sou, MD 09/05/12 0003

## 2012-09-04 NOTE — ED Notes (Addendum)
Pt ambulated with pulse ox. Pt O2 sats were 88-90% during ambulation. Pt stated he was short of breath after ambulation.

## 2012-09-04 NOTE — ED Notes (Signed)
Ambulating in hallway with pulse ox and EMT

## 2012-09-05 NOTE — ED Provider Notes (Signed)
I have personally seen and examined the patient.  I have discussed the plan of care with the resident.  I have reviewed the documentation on PMH/FH/Soc. History.  I have reviewed the documentation of the resident and agree.  Doug Sou, MD 09/05/12 0003

## 2012-09-08 ENCOUNTER — Ambulatory Visit (HOSPITAL_COMMUNITY): Payer: Medicare PPO | Attending: Cardiovascular Disease | Admitting: Radiology

## 2012-09-08 ENCOUNTER — Other Ambulatory Visit (INDEPENDENT_AMBULATORY_CARE_PROVIDER_SITE_OTHER): Payer: Medicare PPO

## 2012-09-08 VITALS — BP 124/72 | Ht 67.0 in | Wt 198.0 lb

## 2012-09-08 DIAGNOSIS — R079 Chest pain, unspecified: Secondary | ICD-10-CM

## 2012-09-08 DIAGNOSIS — J449 Chronic obstructive pulmonary disease, unspecified: Secondary | ICD-10-CM | POA: Insufficient documentation

## 2012-09-08 DIAGNOSIS — I2510 Atherosclerotic heart disease of native coronary artery without angina pectoris: Secondary | ICD-10-CM

## 2012-09-08 DIAGNOSIS — I739 Peripheral vascular disease, unspecified: Secondary | ICD-10-CM | POA: Insufficient documentation

## 2012-09-08 DIAGNOSIS — R0989 Other specified symptoms and signs involving the circulatory and respiratory systems: Secondary | ICD-10-CM | POA: Insufficient documentation

## 2012-09-08 DIAGNOSIS — J4489 Other specified chronic obstructive pulmonary disease: Secondary | ICD-10-CM | POA: Insufficient documentation

## 2012-09-08 DIAGNOSIS — R0609 Other forms of dyspnea: Secondary | ICD-10-CM | POA: Insufficient documentation

## 2012-09-08 DIAGNOSIS — R0602 Shortness of breath: Secondary | ICD-10-CM

## 2012-09-08 DIAGNOSIS — F17200 Nicotine dependence, unspecified, uncomplicated: Secondary | ICD-10-CM | POA: Insufficient documentation

## 2012-09-08 DIAGNOSIS — I10 Essential (primary) hypertension: Secondary | ICD-10-CM | POA: Insufficient documentation

## 2012-09-08 DIAGNOSIS — E7800 Pure hypercholesterolemia, unspecified: Secondary | ICD-10-CM

## 2012-09-08 LAB — LIPID PANEL
Cholesterol: 146 mg/dL (ref 0–200)
HDL: 58.8 mg/dL (ref >39.00)
Triglycerides: 224 mg/dL — ABNORMAL HIGH (ref 0.0–149.0)
VLDL: 44.8 mg/dL — ABNORMAL HIGH (ref 0.0–40.0)

## 2012-09-08 LAB — BASIC METABOLIC PANEL
Calcium: 9 mg/dL (ref 8.4–10.5)
GFR: 91.04 mL/min (ref >60.00)
Potassium: 3.5 mEq/L (ref 3.5–5.1)
Sodium: 136 mEq/L (ref 135–145)

## 2012-09-08 MED ORDER — TECHNETIUM TC 99M SESTAMIBI GENERIC - CARDIOLITE
33.0000 | Freq: Once | INTRAVENOUS | Status: AC | PRN
  Administered 2012-09-08: 33 via INTRAVENOUS

## 2012-09-08 MED ORDER — TECHNETIUM TC 99M SESTAMIBI GENERIC - CARDIOLITE
11.0000 | Freq: Once | INTRAVENOUS | Status: AC | PRN
  Administered 2012-09-08: 11 via INTRAVENOUS

## 2012-09-08 MED ORDER — AMINOPHYLLINE 25 MG/ML IV SOLN
75.0000 mg | Freq: Once | INTRAVENOUS | Status: AC
  Administered 2012-09-08: 75 mg via INTRAVENOUS

## 2012-09-08 MED ORDER — REGADENOSON 0.4 MG/5ML IV SOLN
0.4000 mg | Freq: Once | INTRAVENOUS | Status: AC
  Administered 2012-09-08: 0.4 mg via INTRAVENOUS

## 2012-09-08 NOTE — Progress Notes (Signed)
[facility] SITE 3 NUCLEAR MED [id number] [place] [address] [phone]  Cardiology Nuclear Med Study  Ernest [name] is a 56 y.o. male     MRN : [id number]     DOB: Aug 07, 1956  Procedure Date: 09/08/2012  Nuclear Med Background Indication for Stress Test:  Evaluation for Ischemia History:  COPD and 8/11 MPS: Abnormal moderate ischemia inferolateral  EF: 66%, 07/17/10 Heart Cath: EF: 65% N/O CAD multi vessel small vessel Dz, Tx medically Cardiac Risk Factors: Family History - CAD, History of Smoking, Hypertension, Lipids, PVD and Smoker  Symptoms:  Chest Pain, DOE and SOB   Nuclear Pre-Procedure Caffeine/Decaff Intake:  None NPO After: 7:30pm   Lungs:  clear O2 Sat: 93% on room air. IV 0.9% NS with Angio Cath:  18g  IV Site: R Antecubital  IV Started by:  Stanton Kidney, EMT-P  Chest Size (in):  44 Cup Size: n/a  Height: 5\' 7"  (1.702 m)  Weight:  198 lb (89.812 kg)  BMI:  Body mass index is 31.01 kg/(m^2). Tech Comments:  Meds were taken at 8:30am, per patient. This patient became very sob with Lexiscan. He was reversed and this was resolved quickly. The patient felt back to normal.    Nuclear Med Study 1 or 2 day study: 1 day  Stress Test Type:  Eugenie Birks  Reading MD: Charlton Haws, MD  Order Authorizing Provider:  J.Hochrein MD  Resting Radionuclide: Technetium 86m Sestamibi  Resting Radionuclide Dose: 11.0 mCi   Stress Radionuclide:  Technetium 55m Sestamibi  Stress Radionuclide Dose: 33.0 mCi           Stress Protocol Rest HR: 57 Stress HR: 81  Rest BP: 124/72 Stress BP: 169/82  Exercise Time (min): n/a METS: n/a   Predicted Max HR: 163 bpm % Max HR: 49.69 bpm Rate Pressure Product: 28413   Dose of Adenosine (mg):  n/a Dose of Lexiscan: 0.4 mg  Dose of Atropine (mg): n/a Dose of Dobutamine: n/a mcg/kg/min (at max HR)  Stress Test Technologist: Milana Na, EMT-P  Nuclear Technologist:  Domenic Polite, CNMT      Rest Procedure:  Myocardial perfusion imaging was performed at rest 45 minutes following the intravenous administration of Technetium 55m Tetrofosmin. Rest ECG: Sinus Bradycardia with PACS  Stress Procedure:  The patient received IV Lexiscan 0.4 mg over 15-seconds.  Technetium 75m Sestamibi injected at 30-seconds.  There were no significant changes, very sob, lt. Headed, and [employer] with [employer].  Quantitative spect images were obtained after a 45 minute delay. Stress ECG: No significant change from baseline ECG  QPS Raw Data Images:  Normal; no motion artifact; normal heart/lung ratio. Stress Images:  Normal homogeneous uptake in all areas of the myocardium. Rest Images:  Normal homogeneous uptake in all areas of the myocardium. Subtraction (SDS):  Normal Transient Ischemic Dilatation (Normal <1.22):  1.03 Lung/Heart Ratio (Normal <0.45):  0.39  Quantitative Gated Spect Images QGS EDV:  106 ml QGS ESV:  42 ml  Impression Exercise Capacity:  Lexiscan with no exercise. BP Response:  Normal blood pressure response. Clinical Symptoms:  There is dyspnea. ECG Impression:  No significant ST segment change suggestive of ischemia. Comparison with Prior Nuclear Study: No images to compare  Overall Impression:  Normal stress nuclear study.  RV appears dilated  LV Ejection Fraction: 60%.  LV Wall Motion:  NL LV Function; NL Wall Motion   Charlton Haws

## 2012-09-10 ENCOUNTER — Telehealth: Payer: Self-pay | Admitting: Cardiology

## 2012-09-10 ENCOUNTER — Telehealth: Payer: Self-pay | Admitting: Internal Medicine

## 2012-09-10 MED ORDER — PREDNISONE 10 MG PO TABS: ORAL_TABLET | ORAL | Status: DC

## 2012-09-10 MED ORDER — VARENICLINE TARTRATE 0.5 MG X 11 & 1 MG X 42 PO MISC: ORAL | Status: DC

## 2012-09-10 NOTE — Telephone Encounter (Signed)
New Problem:    Patient returned your call about his Stress Test results.  Please call back.

## 2012-09-10 NOTE — Telephone Encounter (Signed)
I spoke with pt and is aware of CDY recs. I have called RX's into the pharmacy. Aware to seek emergency care if he worsens

## 2012-09-10 NOTE — Telephone Encounter (Signed)
Per CY-okay to give Chantix starter kit RX, Prednisone 10 mg # 30 take 4 tabs by mouth x 3 days, 3 tabs by mouth x 3 days, 2 tabs by mouth x 3 days, 1 tab by mouth x 3 days, then stop no refills, and needs to go to ER if he doesn't improve.

## 2012-09-10 NOTE — Telephone Encounter (Signed)
I spoke with pt and c/o increase SOB, wheezing, chest tx, cough w/ very little white phlem x 2 weeks now. His nebulizer med does not seem to be helping like before. He had to call 911 last week bc he could'nt breathe. He states he is trying to quit smoking and would like chantix called in for this. Pt is requesting further recs regarding his breathing. Please advise thanks  No Known Allergies

## 2012-09-10 NOTE — Telephone Encounter (Signed)
Reviewed results with pt who states understanding. 

## 2012-09-15 ENCOUNTER — Other Ambulatory Visit: Payer: Self-pay | Admitting: Internal Medicine

## 2012-09-18 ENCOUNTER — Encounter: Payer: Self-pay | Admitting: *Deleted

## 2012-09-29 ENCOUNTER — Other Ambulatory Visit: Payer: Self-pay | Admitting: Cardiology

## 2012-09-29 NOTE — Telephone Encounter (Signed)
..   Requested Prescriptions   Pending Prescriptions Disp Refills  . amLODipine (NORVASC) 10 MG tablet [Pharmacy Med Name: AMLODIPINE BESYLATE 10 MG TAB] 30 tablet 6    Sig: TAKE 1 TABLET (10 MG TOTAL) BY MOUTH DAILY.

## 2012-09-30 ENCOUNTER — Encounter: Payer: Self-pay | Admitting: Internal Medicine

## 2012-09-30 ENCOUNTER — Ambulatory Visit (INDEPENDENT_AMBULATORY_CARE_PROVIDER_SITE_OTHER): Payer: Medicare PPO | Admitting: Internal Medicine

## 2012-09-30 VITALS — BP 138/64 | HR 76 | Temp 97.9°F | Wt 203.0 lb

## 2012-09-30 DIAGNOSIS — J449 Chronic obstructive pulmonary disease, unspecified: Secondary | ICD-10-CM

## 2012-09-30 DIAGNOSIS — E7800 Pure hypercholesterolemia, unspecified: Secondary | ICD-10-CM

## 2012-09-30 DIAGNOSIS — I10 Essential (primary) hypertension: Secondary | ICD-10-CM

## 2012-09-30 MED ORDER — ATORVASTATIN CALCIUM 20 MG PO TABS: 20.0000 mg | ORAL_TABLET | Freq: Every day | ORAL | Status: DC

## 2012-09-30 MED ORDER — ROFLUMILAST 500 MCG PO TABS: 500.0000 ug | ORAL_TABLET | Freq: Every day | ORAL | Status: DC

## 2012-09-30 NOTE — Assessment & Plan Note (Signed)
Stable.  No chang in medication.  BP: 138/64 mmHg

## 2012-09-30 NOTE — Assessment & Plan Note (Signed)
Patient recently treated for exacerbation with prednisone taper. Patient able to quit smoking on Chantix. His lungs sound much better since quitting smoking. Continue maintenance inhalers and Daliresp.

## 2012-09-30 NOTE — Assessment & Plan Note (Signed)
Continue lipitor.   Lab Results  Component Value Date   CHOL 146 09/08/2012   HDL 58.80 09/08/2012   LDLCALC 79 04/24/2011   LDLDIRECT 71.4 09/08/2012   TRIG 224.0* 09/08/2012   CHOLHDL 2 09/08/2012

## 2012-09-30 NOTE — Patient Instructions (Addendum)
Please complete the following lab tests before your next follow up appointment: BMET - 401.9 FLP, LFTs, TSH - 272.4 

## 2012-09-30 NOTE — Progress Notes (Signed)
Subjective:    Patient ID: Ernest [name], male    DOB: [birth year], 56 y.o.   MRN: [id number]  HPI  56 year old white male with history of coronary artery disease and moderate COPD for follow up. Since previous visit he was seen in ER for COPD exacerbation. Patient reports he was able to quit smoking since then. Has been one week since he had a cigarette. He was started on Chantix by Dr. Maple Hudson. His breathing has significantly improved. He coughing and wheezing much less. He is also had good response to Daliresp.  Hypertension - stable  Review of Systems Weight is stable  Past Medical History  Diagnosis Date  . CAD (coronary artery disease)     Left Main 30% stenosis, LAD 20 - 30 % stenosis, first and second diagonal branchesat 40 - 50%  stenosis with small arteries, circumflex had 30% stenosis in the large obtuse marginal, RCA at 70 - 80%  stenosis [not felt to be occlusive after evaluation with flow wire], distal 50 - 60% stenosis - [name][  . COPD (chronic obstructive pulmonary disease)     Dr. Jetty Duhamel  . Depression   . Anxiety   . Hyperlipidemia   . Chronic insomnia   . OSA (obstructive sleep apnea)     NPSG 09/10/10- AHI 11.3/hr  . Gout   . GERD (gastroesophageal reflux disease)   . PVD (peripheral vascular disease)     (ABI 0.9 on right and 0.89 on left)  severe left external iliac stenosis.  He  had successful stenting of his left external iliac per Dr. Excell Seltzer.  . DDD (degenerative disc disease)   . Hx of colonoscopy   . Hypertension     History   Social History  . Marital Status: Married    Spouse Name: N/A    Number of Children: N/A  . Years of Education: N/A   Occupational History  . Disabled welder    Social History Main Topics  . Smoking status: Former Smoker -- 0.2 packs/day for 43 years    Types: Cigarettes    Quit date: 09/23/2012  . Smokeless tobacco: Never Used     Comment: On Chantix.  Quit date 04/12/11 smokes 3 cigarettes daily    . Alcohol Use: No  . Drug Use: No  . Sexually Active: No   Other Topics Concern  . Not on file   Social History Narrative  . No narrative on file    Past Surgical History  Procedure Date  . Spinal fusion 03/05/2007    L4-L5  . Hip surgery     left  . Arm surgery     left  . Shoulder surgery     right  . C-spine surgery   . Angioplasty     Family History  Problem Relation Age of Onset  . Heart attack Mother   . Heart attack Sister   . Emphysema Sister     No Known Allergies  Current Outpatient Prescriptions on File Prior to Visit  Medication Sig Dispense Refill  . albuterol (PROVENTIL HFA;VENTOLIN HFA) 108 (90 BASE) MCG/ACT inhaler Inhale 2 puffs into the lungs 4 (four) times daily as needed. For wheezing      . albuterol (PROVENTIL) (2.5 MG/3ML) 0.083% nebulizer solution Take 2.5 mg by nebulization every 6 (six) hours as needed. For wheezing      . amLODipine (NORVASC) 10 MG tablet TAKE 1 TABLET (10 MG TOTAL) BY MOUTH DAILY.  30 tablet  6  .  ARIPiprazole (ABILIFY) 5 MG tablet Take 5 mg by mouth daily.      . carvedilol (COREG) 25 MG tablet Take 1 tablet (25 mg total) by mouth 2 (two) times daily with a meal.  60 tablet  11  . clonazePAM (KLONOPIN) 1 MG tablet Take 1 mg by mouth at bedtime as needed. For anxiety/insomnia      . esomeprazole (NEXIUM) 40 MG capsule Take 40 mg by mouth at bedtime.      [name] FLUoxetine (PROZAC) 40 MG capsule Take 40 mg by mouth daily.        . Fluticasone-Salmeterol (ADVAIR DISKUS) 250-50 MCG/DOSE AEPB Inhale 1 puff into the lungs 2 (two) times daily.  1 each  3  . Indacaterol Maleate (ARCAPTA NEOHALER) 75 MCG CAPS Place 1 capsule into inhaler and inhale daily.  30 capsule  5  . losartan (COZAAR) 25 MG tablet Take 1 tablet (25 mg total) by mouth daily.  30 tablet  11  . mirtazapine (REMERON) 45 MG tablet Take 22.5 mg by mouth at bedtime.       . modafinil (PROVIGIL) 200 MG tablet Take 200 mg by mouth daily.        [name] morphine (MS CONTIN) 60  MG 12 hr tablet Take 60 mg by mouth 2 (two) times daily.        . Omega-3 Fatty Acids (FISH OIL CONCENTRATE) 1000 MG CAPS Take 1,000 mg by mouth daily.       [name] oxyCODONE-acetaminophen (PERCOCET) 10-325 MG per tablet Take 1 tablet by mouth every 4 (four) hours as needed. For pain      . potassium chloride SA (K-DUR,KLOR-CON) 20 MEQ tablet Take 20 mEq by mouth daily.      . predniSONE (DELTASONE) 10 MG tablet Take 4 tabs/daily x 3 days, 3 tabs/daily x 3 days, 2 tabs/daily x 3 days, 1 tab/daily x 3 days then stop  30 tablet  0  . PROVENTIL HFA 108 (90 BASE) MCG/ACT inhaler INHALE 2 PUFFS INTO THE LUNGS 4 (FOUR) TIMES DAILY AS NEEDED FOR WHEEZING.  6.7 each  10  . varenicline (CHANTIX STARTING MONTH PAK) 0.5 MG X 11 & 1 MG X 42 tablet Take 0.5mg  tablet by mouth daily x3days, then increase to 0.5mg  tab twice daily x4days, then increase to 1mg  tab twice daily.  53 tablet  0  . VOLTAREN 1 % GEL Apply 2 g topically once a week.       . [DISCONTINUED] atorvastatin (LIPITOR) 20 MG tablet TAKE 1 TABLET (20 MG TOTAL) BY MOUTH DAILY.  30 tablet  6    BP 138/64  Pulse 76  Temp 97.9 F (36.6 C) (Oral)  Wt 203 lb (92.08 kg)       Objective:   Physical Exam  Constitutional: He appears well-developed and well-nourished.  HENT:  Head: Normocephalic and atraumatic.  Cardiovascular: Normal rate, regular rhythm and normal heart sounds.   Pulmonary/Chest: Effort normal and breath sounds normal. No respiratory distress. He has no wheezes.  Musculoskeletal: He exhibits no edema.  Skin: Skin is warm and dry.  Psychiatric: He has a normal mood and affect. His behavior is normal.          Assessment & Plan:

## 2012-10-01 ENCOUNTER — Ambulatory Visit: Payer: Medicare PPO | Admitting: Cardiology

## 2012-10-30 ENCOUNTER — Ambulatory Visit (INDEPENDENT_AMBULATORY_CARE_PROVIDER_SITE_OTHER): Payer: Medicare PPO | Admitting: Cardiology

## 2012-10-30 ENCOUNTER — Encounter: Payer: Self-pay | Admitting: Cardiology

## 2012-10-30 VITALS — BP 121/72 | HR 76 | Ht 68.4 in | Wt 198.0 lb

## 2012-10-30 DIAGNOSIS — I2510 Atherosclerotic heart disease of native coronary artery without angina pectoris: Secondary | ICD-10-CM

## 2012-10-30 DIAGNOSIS — I10 Essential (primary) hypertension: Secondary | ICD-10-CM

## 2012-10-30 DIAGNOSIS — E7800 Pure hypercholesterolemia, unspecified: Secondary | ICD-10-CM

## 2012-10-30 DIAGNOSIS — R079 Chest pain, unspecified: Secondary | ICD-10-CM

## 2012-10-30 NOTE — Patient Instructions (Addendum)
The current medical regimen is effective;  continue present plan and medications.  Follow up in 1 year with Dr Hochrein.  You will receive a letter in the mail 2 months before you are due.  Please call us when you receive this letter to schedule your follow up appointment.  

## 2012-10-30 NOTE — Progress Notes (Signed)
HPI The patient presents for follow of known coronary disease and peripheral vascular disease. Earlier in the year he has been having some chest discomfort and I sent him for a stress perfusion study which demonstrated no high-risk findings. Since that time he has had no further chest discomfort. He has not required any nitroglycerin. He has continued to have dyspnea and actually went to the emergency room in October. He was thought to be having a COPD flare. I reviewed the ER records.  He since stopped smoking. He does describe some occasional aching in his feet but is not describing any usual claudication. He's not been having any PND or orthopnea. He has had no palpitations, presyncope or syncope. He has had no weight gain and no edema.  No Known Allergies  Current Outpatient Prescriptions  Medication Sig Dispense Refill  . albuterol (PROVENTIL HFA;VENTOLIN HFA) 108 (90 BASE) MCG/ACT inhaler Inhale 2 puffs into the lungs 4 (four) times daily as needed. For wheezing      . albuterol (PROVENTIL) (2.5 MG/3ML) 0.083% nebulizer solution Take 2.5 mg by nebulization every 6 (six) hours as needed. For wheezing      . amLODipine (NORVASC) 10 MG tablet TAKE 1 TABLET (10 MG TOTAL) BY MOUTH DAILY.  30 tablet  6  . ARIPiprazole (ABILIFY) 5 MG tablet Take 5 mg by mouth daily.      [name] atorvastatin (LIPITOR) 20 MG tablet Take 1 tablet (20 mg total) by mouth daily.  90 tablet  1  . carvedilol (COREG) 25 MG tablet Take 1 tablet (25 mg total) by mouth 2 (two) times daily with a meal.  60 tablet  11  . clonazePAM (KLONOPIN) 1 MG tablet Take 1 mg by mouth at bedtime as needed. For anxiety/insomnia      . esomeprazole (NEXIUM) 40 MG capsule Take 40 mg by mouth at bedtime.      [name] FLUoxetine (PROZAC) 40 MG capsule Take 40 mg by mouth daily.        . Fluticasone-Salmeterol (ADVAIR DISKUS) 250-50 MCG/DOSE AEPB Inhale 1 puff into the lungs 2 (two) times daily.  1 each  3  . Indacaterol Maleate (ARCAPTA NEOHALER) 75 MCG  CAPS Place 1 capsule into inhaler and inhale daily.  30 capsule  5  . losartan (COZAAR) 25 MG tablet Take 1 tablet (25 mg total) by mouth daily.  30 tablet  11  . mirtazapine (REMERON) 45 MG tablet Take 22.5 mg by mouth at bedtime.       . modafinil (PROVIGIL) 200 MG tablet Take 200 mg by mouth daily.        [name] morphine (MS CONTIN) 60 MG 12 hr tablet Take 60 mg by mouth 2 (two) times daily.        . Omega-3 Fatty Acids (FISH OIL CONCENTRATE) 1000 MG CAPS Take 1,000 mg by mouth daily.       [name] oxyCODONE-acetaminophen (PERCOCET) 10-325 MG per tablet Take 1 tablet by mouth every 4 (four) hours as needed. For pain      . potassium chloride SA (K-DUR,KLOR-CON) 20 MEQ tablet Take 20 mEq by mouth daily.      [name] PROVENTIL HFA 108 (90 BASE) MCG/ACT inhaler INHALE 2 PUFFS INTO THE LUNGS 4 (FOUR) TIMES DAILY AS NEEDED FOR WHEEZING.  6.7 each  10  . VOLTAREN 1 % GEL Apply 2 g topically once a week.         Past Medical History  Diagnosis Date  . CAD (coronary artery  disease)     Left Main 30% stenosis, LAD 20 - 30 % stenosis, first and second diagonal branchesat 40 - 50%  stenosis with small arteries, circumflex had 30% stenosis in the large obtuse marginal, RCA at 70 - 80%  stenosis [not felt to be occlusive after evaluation with flow wire], distal 50 - 60% stenosis - Margel Joens[  . COPD (chronic obstructive pulmonary disease)     Dr. Jetty Duhamel  . Depression   . Anxiety   . Hyperlipidemia   . Chronic insomnia   . OSA (obstructive sleep apnea)     NPSG 09/10/10- AHI 11.3/hr  . Gout   . GERD (gastroesophageal reflux disease)   . PVD (peripheral vascular disease)     (ABI 0.9 on right and 0.89 on left)  severe left external iliac stenosis.  He  had successful stenting of his left external iliac per Dr. Excell Seltzer.  . DDD (degenerative disc disease)   . Hx of colonoscopy   . Hypertension     Past Surgical History  Procedure Date  . Spinal fusion 03/05/2007    L4-L5  . Hip surgery     left  .  Arm surgery     left  . Shoulder surgery     right  . C-spine surgery   . Angioplasty     ROS:  Claudication.  Otherwise as stated in the HPI and negative for all other systems.  PHYSICAL EXAM BP 121/72  Pulse 76  Ht 5' 8.4" (1.737 m)  Wt 198 lb (89.812 kg)  BMI 29.75 kg/m2 GENERAL:  Well appearing HEENT:  Pupils equal round and reactive, fundi not visualized, oral mucosa unremarkable, dentures NECK:  No jugular venous distention, waveform within normal limits, carotid upstroke brisk and symmetric, no bruits, no thyromegaly LYMPHATICS:  No cervical, inguinal adenopathy LUNGS:  Course rhonchi  bilaterally BACK:  No CVA tenderness CHEST:  Unremarkable HEART:  PMI not displaced or sustained,S1 and S2 within normal limits, no S3, no S4, no clicks, no rubs, no murmurs ABD:  Flat, positive bowel sounds normal in frequency in pitch, no bruits, no rebound, no guarding, no midline pulsatile mass, no hepatomegaly, no splenomegaly EXT:  2 plus pulses upper, DP/PT left 2 plus, absent DP/PT right, no edema, no cyanosis no clubbing SKIN:  No rashes no nodules NEURO:  Cranial nerves II through XII grossly intact, motor grossly intact throughout PSYCH:  Cognitively intact, oriented to person place and time   EKG:  Sinus rhythm, rate 76, axis within normal limits, intervals within normal limits, no acute ST-T wave changes.  10/30/2012   ASSESSMENT AND PLAN  CORONARY ATHEROSCLEROSIS NATIVE CORONARY ARTERY -  The patient has no new sypmtoms.  No further cardiovascular testing is indicated.  We will continue with aggressive risk reduction and meds as listed.  HYPERTENSION -  At the last visit I added Cozaar and his blood pressure seems to be better controlled. No change in therapy is indicated.  UNSPECIFIED PERIPHERAL VASCULAR DISEASE -  I reviewed his ABIs from last year. He has no new symptoms consistent with claudication. Now he has stopped smoking should help. He needs to exercise.  PURE  HYPERCHOLESTEROLEMIA -  His LDL was 71 with an HDL of 58 in October. He will the meds as listed.  TOBACCO USER -  I am very proud of him for stopping smoking.

## 2012-11-12 ENCOUNTER — Ambulatory Visit: Payer: Medicare PPO | Admitting: Internal Medicine

## 2012-11-13 ENCOUNTER — Encounter: Payer: Self-pay | Admitting: Internal Medicine

## 2012-11-13 ENCOUNTER — Ambulatory Visit (INDEPENDENT_AMBULATORY_CARE_PROVIDER_SITE_OTHER): Payer: Medicare PPO | Admitting: Internal Medicine

## 2012-11-13 VITALS — BP 118/70 | HR 76 | Ht 67.0 in | Wt 202.2 lb

## 2012-11-13 DIAGNOSIS — J4489 Other specified chronic obstructive pulmonary disease: Secondary | ICD-10-CM

## 2012-11-13 DIAGNOSIS — F17200 Nicotine dependence, unspecified, uncomplicated: Secondary | ICD-10-CM

## 2012-11-13 DIAGNOSIS — J449 Chronic obstructive pulmonary disease, unspecified: Secondary | ICD-10-CM

## 2012-11-13 NOTE — Patient Instructions (Addendum)
We can continue present treatment  Sample  Nasal steroid spray    Nasonex   1-2 puffs each nostril once daily.

## 2012-11-13 NOTE — Progress Notes (Signed)
Patient ID: Ernest [name], male    DOB: 03-Mar-1956, 56 y.o.   MRN: [id number]  HPI 07/30/11- 40 yoM smoker followed for COPD/ bronchitis, OSA/ CPAP Last here 04/01/11- note reviewed. He had felt better on a trial of maintenance prednisone. He has felt more short of breath through the hot summer, with no acute event and without blood, chest pain or fever. Easy DOE around home. Has been sharing nebulizer with his wife, who now has a cold and we discussed this. Has been worse in last month, and expecially in recent rainy weather. Noting a little ankle swelling. Smothered trying to take a shower.  Only tolerating his CPAP about 3 nights/ week because he feels smothered, waking sore in anterior chest in the mornings.  Now down to only 2-3 cigarettes/ day and trying to stop.  CXR 08/14/10- Nl NAD PFT 05/26/08- FEV1/FVC 2.44/ 0.76, incr 19% w/ BD, R 0.59 ?DLCO 0.69  09/10/11- 55 yoM smoker followed for COPD/ bronchitis, OSA/ CPAP He reports feeling better "finally doing okay". He is down to just a couple of cigarettes a day. He is using CPAP regularly now and is comfortable with it. Alpha I antitrypsin Report came back normal "M. M." on 08/09/2011. Chest x-ray from 08/15/2011 showed stable changes of COPD. He has had flu vaccine.  11/12/11- 46 yoM smoker followed for COPD/ bronchitis, OSA/ CPAP Here with wife. Has had flu shot. He says he was doing well until 2 weeks ago. He again is coughing thick mucus which is difficult to clear. He depends on his metered inhaler and nebulizer to help clear his airways. Started Mucinex 3 days ago. Mucus is white. He denies fever, sore throat, chest pain, GI upset. Because of orthopnea he is sitting up to sleep for the past 2 nights.  01/14/12- 55 yoM smoker followed for COPD/ bronchitis, OSA/ CPAP Increase congested cough green to yellow sputum but he does not feel sick and denies fever. Sister who was a smoker recently died of lung cancer. Despite that and all of our  discussions, he still smokes a few cigarettes daily. We talked again about motivation to stop completely. Continues CPAP and Provigil one or 2 daily.  03/10/12  56 yoM smoker followed for COPD/ bronchitis, OSA/ CPAP Persistent bronchitic cough. Sputum is not purulent. He is "working on" his smoking habit but blames pollen for keeping him S. coughing now. Remains fully compliant with CPAP, all night every night. Still needs Provigil to help with residual daytime sleepiness as before.  05/12/12- 56 yoM smoker followed for COPD/ bronchitis, OSA/ CPAP Stays SOB all the time-even at rest; wheezing, coughing-productive-yellow in color, chest congestion, nasal congestion,. He is more interested today in smoking cessation which we discussed. He had quit for Daliresp after 10 days because of sustained nausea. He is taking all of his regular medicines and using his rescue inhaler several times a day. Off prednisone now for 1-2 months. CXR 01/23/12-  IMPRESSION:  No acute cardiopulmonary abnormality.  Original Report Authenticated By: Randall An, ErnestD.   07/13/12- 45 yoM smoker followed for COPD/ bronchitis, OSA/ CPAP, complicated by CAD Pt states increased sob,wheezing,productive cough x3 months. Still smoking with no serious effort to stop. His sister has lung cancer which has gotten his attention and he may be willing to try harder. Some days his breathing is better than others with no real trend. Coughing productive of white sputum with no blood. He has all of his medications currently.  07/30/12- 10 yoM  smoker followed for COPD/ bronchitis, OSA/ CPAP, complicated by CAD Cough-productive-yellow and thick; SOB, wheezing, chest congestion, head congestion x 1 week; O2 sat of 83% RA entered room-20 seconds RA sat of 90% CT 07/21/12-  IMPRESSION:  1. Tiny bilateral lung nodules which are similar to on the prior  exam, given differences in slice selection and measurement error.  Likely subpleural lymph  nodes.  2. Age advanced coronary artery atherosclerosis. Recommend  assessment of coronary risk factors and consideration of medical  therapy.  3. Slight enlargement of splenic lesion since 2007. Favored to  represent a hemangioma or lymphangioma.  Original Report Authenticated By: Consuello Bossier, ErnestD.   08/07/12- 89 yoM smoker followed for COPD/ bronchitis, OSA/ CPAP, complicated by CAD, disability for back pain. Cough-productive-white mostly; SOB , wheezing-worse at night     wife here Coughing spasms he can feel his upper airway is closing off. He he still smokes a little even when he feels badly and we went over this again today offering support.  11/13/12- 56 yoM smoker followed for COPD/ bronchitis, OSA/ CPAP, complicated by CAD, disability for back pain. FOLLOWS [id number] still but not productive-more SOB  Went to ER in September or acute exacerbation of COPD but not admitted. Today he is a scheduled visit. He actually says he is doing pretty well with no acute problems. Perennial nasal congestion. He has reduced his cigarette smoking to about 2 per day and is strongly encouraged to stop completely now.  Review of Systems- see HPI Constitutional:   No-   weight loss, night sweats, fevers, chills,+ fatigue, lassitude. HEENT:   No-  headaches, difficulty swallowing, tooth/dental problems, sore throat,       No-  sneezing, itching, ear ache, +nasal congestion, post nasal drip,  CV:   No chest pain, +orthopnea, PND,  Little swelling in lower extremities,  dizziness, palpitations Resp: +  shortness of breath with exertion or at rest.             Little productive cough,  + non-productive cough,  No-  coughing up of blood.              No-   change in color of mucus.  +some wheezing.   Skin: No-   rash or lesions. GI:  No-   heartburn, indigestion, abdominal pain, nausea, vomiting, GU:  MS:  No-   joint pain or swelling.   Neuro- nothing unusual  Psych:  No- change in mood or affect. No  depression or anxiety.  No memory loss.    Objective:   Physical Exam General- Alert, Oriented, Affect-appropriate, Distress- none acute   Stocky/ overweight, full beard Skin- rash-none, lesions- none, excoriation- none. Tanned     Lymphadenopathy- none Head- atraumatic            Eyes- Gross vision intact, PERRLA, conjunctivae clear secretions            Ears- Hearing, canals- normal            Nose- Clear, No- Septal dev, mucus, polyps, erosion, perforation             Throat- Mallampati III , mucosa red , drainage- none, tonsils- atrophic, hoarse Neck- flexible , trachea midline, no stridor , thyroid nl, carotid no bruit Chest - symmetrical excursion , unlabored           Heart/CV- RRR , no murmur , no gallop  , no rub, nl s1 s2                           -  JVD- none , edema-none, stasis changes- none, varices- none           Lung-+ distant /Coarse, unlabored,  + trace wheeze, + barking cough , dullness-none, rub- none           Chest wall-  Abd-  Br/ Gen/ Rectal- Not done, not indicated Extrem- cyanosis- none, clubbing, none, atrophy- none, strength- nl. 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 Neuro- grossly intact to observation

## 2012-11-21 ENCOUNTER — Other Ambulatory Visit: Payer: Self-pay | Admitting: Cardiology

## 2012-11-25 ENCOUNTER — Encounter: Payer: Self-pay | Admitting: Internal Medicine

## 2012-11-25 NOTE — Assessment & Plan Note (Signed)
Habits has gotten down to just a few cigarettes a day. I am trying to work with him towards complete cessation.

## 2012-11-25 NOTE — Assessment & Plan Note (Signed)
At baseline today in relatively good control. We discussed how to get through the winter, avoiding colds as much as possible

## 2012-11-26 ENCOUNTER — Other Ambulatory Visit: Payer: Self-pay | Admitting: Cardiology

## 2013-01-12 ENCOUNTER — Other Ambulatory Visit: Payer: Self-pay | Admitting: Physician Assistant

## 2013-01-12 DIAGNOSIS — M549 Dorsalgia, unspecified: Secondary | ICD-10-CM

## 2013-01-18 ENCOUNTER — Inpatient Hospital Stay: Admission: RE | Admit: 2013-01-18 | Payer: Medicare PPO | Source: Ambulatory Visit

## 2013-01-18 ENCOUNTER — Other Ambulatory Visit: Payer: Medicare PPO

## 2013-01-18 ENCOUNTER — Ambulatory Visit
Admission: RE | Admit: 2013-01-18 | Discharge: 2013-01-18 | Disposition: A | Discharge status: Home / Self Care | Payer: Medicare PPO | Source: Ambulatory Visit | Attending: Physician Assistant | Admitting: Physician Assistant

## 2013-01-18 VITALS — BP 116/57 | HR 62

## 2013-01-18 DIAGNOSIS — M549 Dorsalgia, unspecified: Secondary | ICD-10-CM

## 2013-01-18 MED ORDER — ONDANSETRON HCL 4 MG/2ML IJ SOLN
4.0000 mg | Freq: Once | INTRAMUSCULAR | Status: AC
  Administered 2013-01-18: 4 mg via INTRAMUSCULAR

## 2013-01-18 MED ORDER — DIAZEPAM 5 MG PO TABS
10.0000 mg | ORAL_TABLET | Freq: Once | ORAL | Status: AC
  Administered 2013-01-18: 10 mg via ORAL

## 2013-01-18 MED ORDER — MEPERIDINE HCL 100 MG/ML IJ SOLN
75.0000 mg | Freq: Once | INTRAMUSCULAR | Status: AC
  Administered 2013-01-18: 75 mg via INTRAMUSCULAR

## 2013-01-18 MED ORDER — IOHEXOL 180 MG/ML  SOLN: 15.0000 mL | Freq: Once | INTRAMUSCULAR | Status: AC | PRN

## 2013-01-18 NOTE — Progress Notes (Signed)
Patient states his last dose of Abilify, Remeron and Fluoxetine/Prozac was "three days ago."  jkl

## 2013-01-25 ENCOUNTER — Ambulatory Visit (INDEPENDENT_AMBULATORY_CARE_PROVIDER_SITE_OTHER): Payer: Medicare PPO | Admitting: Internal Medicine

## 2013-01-25 ENCOUNTER — Encounter: Payer: Self-pay | Admitting: Internal Medicine

## 2013-01-25 VITALS — BP 150/78 | HR 81 | Ht 67.0 in | Wt 199.0 lb

## 2013-01-25 DIAGNOSIS — G4733 Obstructive sleep apnea (adult) (pediatric): Secondary | ICD-10-CM

## 2013-01-25 DIAGNOSIS — J449 Chronic obstructive pulmonary disease, unspecified: Secondary | ICD-10-CM

## 2013-01-25 DIAGNOSIS — F17200 Nicotine dependence, unspecified, uncomplicated: Secondary | ICD-10-CM

## 2013-01-25 MED ORDER — VARENICLINE TARTRATE 1 MG PO TABS: 1.0000 mg | ORAL_TABLET | Freq: Two times a day (BID) | ORAL | Status: DC

## 2013-01-25 MED ORDER — VARENICLINE TARTRATE 0.5 MG X 11 & 1 MG X 42 PO MISC: ORAL | Status: DC

## 2013-01-25 NOTE — Progress Notes (Signed)
Patient ID: Ernest [name], male    DOB: 03-Mar-1956, 57 y.o.   MRN: [id number]  HPI 07/30/11- 40 yoM smoker followed for COPD/ bronchitis, OSA/ CPAP Last here 04/01/11- note reviewed. He had felt better on a trial of maintenance prednisone. He has felt more short of breath through the hot summer, with no acute event and without blood, chest pain or fever. Easy DOE around home. Has been sharing nebulizer with his wife, who now has a cold and we discussed this. Has been worse in last month, and expecially in recent rainy weather. Noting a little ankle swelling. Smothered trying to take a shower.  Only tolerating his CPAP about 3 nights/ week because he feels smothered, waking sore in anterior chest in the mornings.  Now down to only 2-3 cigarettes/ day and trying to stop.  CXR 08/14/10- Nl NAD PFT 05/26/08- FEV1/FVC 2.44/ 0.76, incr 19% w/ BD, R 0.59 ?DLCO 0.69  09/10/11- 55 yoM smoker followed for COPD/ bronchitis, OSA/ CPAP He reports feeling better "finally doing okay". He is down to just a couple of cigarettes a day. He is using CPAP regularly now and is comfortable with it. Alpha I antitrypsin Report came back normal "M. M." on 08/09/2011. Chest x-ray from 08/15/2011 showed stable changes of COPD. He has had flu vaccine.  11/12/11- 46 yoM smoker followed for COPD/ bronchitis, OSA/ CPAP Here with wife. Has had flu shot. He says he was doing well until 2 weeks ago. He again is coughing thick mucus which is difficult to clear. He depends on his metered inhaler and nebulizer to help clear his airways. Started Mucinex 3 days ago. Mucus is white. He denies fever, sore throat, chest pain, GI upset. Because of orthopnea he is sitting up to sleep for the past 2 nights.  01/14/12- 55 yoM smoker followed for COPD/ bronchitis, OSA/ CPAP Increase congested cough green to yellow sputum but he does not feel sick and denies fever. Sister who was a smoker recently died of lung cancer. Despite that and all of our  discussions, he still smokes a few cigarettes daily. We talked again about motivation to stop completely. Continues CPAP and Provigil one or 2 daily.  03/10/12  56 yoM smoker followed for COPD/ bronchitis, OSA/ CPAP Persistent bronchitic cough. Sputum is not purulent. He is "working on" his smoking habit but blames pollen for keeping him S. coughing now. Remains fully compliant with CPAP, all night every night. Still needs Provigil to help with residual daytime sleepiness as before.  05/12/12- 56 yoM smoker followed for COPD/ bronchitis, OSA/ CPAP Stays SOB all the time-even at rest; wheezing, coughing-productive-yellow in color, chest congestion, nasal congestion,. He is more interested today in smoking cessation which we discussed. He had quit for Daliresp after 10 days because of sustained nausea. He is taking all of his regular medicines and using his rescue inhaler several times a day. Off prednisone now for 1-2 months. CXR 01/23/12-  IMPRESSION:  No acute cardiopulmonary abnormality.  Original Report Authenticated By: Randall An, M.D.   07/13/12- 45 yoM smoker followed for COPD/ bronchitis, OSA/ CPAP, complicated by CAD Pt states increased sob,wheezing,productive cough x3 months. Still smoking with no serious effort to stop. His sister has lung cancer which has gotten his attention and he may be willing to try harder. Some days his breathing is better than others with no real trend. Coughing productive of white sputum with no blood. He has all of his medications currently.  07/30/12- 10 yoM  smoker followed for COPD/ bronchitis, OSA/ CPAP, complicated by CAD Cough-productive-yellow and thick; SOB, wheezing, chest congestion, head congestion x 1 week; O2 sat of 83% RA entered room-20 seconds RA sat of 90% CT 07/21/12-  IMPRESSION:  1. Tiny bilateral lung nodules which are similar to on the prior  exam, given differences in slice selection and measurement error.  Likely subpleural lymph  nodes.  2. Age advanced coronary artery atherosclerosis. Recommend  assessment of coronary risk factors and consideration of medical  therapy.  3. Slight enlargement of splenic lesion since 2007. Favored to  represent a hemangioma or lymphangioma.  Original Report Authenticated By: Consuello Bossier, M.D.   08/07/12- 2 yoM smoker followed for COPD/ bronchitis, OSA/ CPAP, complicated by CAD, disability for back pain. Cough-productive-white mostly; SOB , wheezing-worse at night     wife here Coughing spasms he can feel his upper airway is closing off. He he still smokes a little even when he feels badly and we went over this again today offering support.  11/13/12- 56 yoM smoker followed for COPD/ bronchitis, OSA/ CPAP, complicated by CAD, disability for back pain. FOLLOWS EAV:WUJWJ still but not productive-more SOB  Went to ER in September or acute exacerbation of COPD but not admitted. Today he is a scheduled visit. He actually says he is doing pretty well with no acute problems. Perennial nasal congestion. He has reduced his cigarette smoking to about 2 per day and is strongly encouraged to stop completely now.  01/25/13-  57 yoM smoker followed for COPD/ bronchitis, OSA/ CPAP, complicated by CAD, disability for back pain. ACUTE VISIT: started smoking agian-discuss taking Chantix; feels raw in chest area with deep cough x 2 weeks. He feels well at this visit, meaning he is at his baseline some daily nonproductive cough and shortness of breath with exertion.  Review of Systems- see HPI Constitutional:   No-   weight loss, night sweats, fevers, chills,+ fatigue, lassitude. HEENT:   No-  headaches, difficulty swallowing, tooth/dental problems, sore throat,       No-  sneezing, itching, ear ache, +nasal congestion, post nasal drip,  CV:   No chest pain, +orthopnea, PND,  Little swelling in lower extremities,  dizziness, palpitations Resp: +  shortness of breath with exertion or at rest.              Little productive cough,  + non-productive cough,  No-  coughing up of blood.              No-   change in color of mucus.  +some wheezing.   Skin: No-   rash or lesions. GI:  No-   heartburn, indigestion, abdominal pain, nausea, vomiting, GU:  MS:  No-   joint pain or swelling.   Neuro- nothing unusual  Psych:  No- change in mood or affect. No depression or anxiety.  No memory loss.    Objective:   Physical Exam General- Alert, Oriented, Affect-appropriate, Distress- none acute   Stocky/ overweight, full beard Skin- rash-none, lesions- none, excoriation- none. Tanned     Lymphadenopathy- none Head- atraumatic            Eyes- Gross vision intact, PERRLA, conjunctivae clear secretions            Ears- Hearing, canals- normal            Nose- Clear, No- Septal dev, mucus, polyps, erosion, perforation             Throat- Mallampati  III , mucosa red , drainage- none, tonsils- atrophic, hoarse Neck- flexible , trachea midline, no stridor , thyroid nl, carotid no bruit Chest - symmetrical excursion , unlabored           Heart/CV- RRR , no murmur , no gallop  , no rub, nl s1 s2                           - JVD- none , edema-none, stasis changes- none, varices- none           Lung-+ distant /Coarse, unlabored,  No- wheeze, no- cough , dullness-none, rub- none           Chest wall-  Abd-  Br/ Gen/ Rectal- Not done, not indicated Extrem- cyanosis- none, clubbing, none, atrophy- none, strength- nl. 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 Neuro- grossly intact to observation

## 2013-01-25 NOTE — Patient Instructions (Addendum)
Scripts for [employer] kit and maintenance kit  Please call as needed

## 2013-01-26 NOTE — Assessment & Plan Note (Signed)
He is using CPAP

## 2013-01-26 NOTE — Assessment & Plan Note (Signed)
He wants to make an effort to quit and asks to try Chantix again. This effort is strongly encouraged and supported in our discussion today Plan-Chantix was discussed and restarted.

## 2013-01-26 NOTE — Assessment & Plan Note (Signed)
Without acute exacerbation at this visit

## 2013-02-09 ENCOUNTER — Telehealth: Payer: Self-pay | Admitting: Internal Medicine

## 2013-02-09 DIAGNOSIS — R079 Chest pain, unspecified: Secondary | ICD-10-CM

## 2013-02-09 NOTE — Telephone Encounter (Signed)
Was contacted by [facility] they are stating the pt has been established with them since 2008. Pt has recently switched insurance and is needing a referral so his visits will be covered

## 2013-02-10 NOTE — Telephone Encounter (Signed)
Please place order for referral to pain mgt

## 2013-02-24 ENCOUNTER — Other Ambulatory Visit: Payer: Self-pay | Admitting: Internal Medicine

## 2013-02-26 ENCOUNTER — Telehealth: Payer: Self-pay | Admitting: Internal Medicine

## 2013-02-26 MED ORDER — ALBUTEROL SULFATE HFA 108 (90 BASE) MCG/ACT IN AERS: 2.0000 | INHALATION_SPRAY | RESPIRATORY_TRACT | Status: DC | PRN

## 2013-02-26 NOTE — Telephone Encounter (Signed)
Pt's wife seen by Dr. Maple Hudson today. Reports their dog "ate" his albuterol hfa inhaler. Pharm advised it was too early for rx. Requesting proair hfa sample. Spoke with Dr. Maple Hudson. This is ok. Wife was given 1 sample of proair hfa.

## 2013-03-07 ENCOUNTER — Other Ambulatory Visit: Payer: Self-pay | Admitting: Internal Medicine

## 2013-03-08 ENCOUNTER — Telehealth: Payer: Self-pay | Admitting: Internal Medicine

## 2013-03-08 NOTE — Telephone Encounter (Signed)
Cindy, please see notes below. Shourya called me back and we discussed the Nexium. He states he will do #30 for 30 and take OTC for breakthrough sx. If Dr Artist Pais approves, can a new rx be sent to [facility] for once daily Nexium?

## 2013-03-08 NOTE — Telephone Encounter (Signed)
See if they will approve omeprazole 40 mg one po bid.

## 2013-03-08 NOTE — Telephone Encounter (Signed)
Then inform pt if he has break through GERD symptoms, he will have to obtain OTC PPI for second dose.

## 2013-03-08 NOTE — Telephone Encounter (Signed)
I tried to get prior auth approval on twice daily Nexium use based on GERD & COPD dx, as we discussed last Fri. It was denied. [employer] will only pay for #30 for 30. Please advise.

## 2013-03-08 NOTE — Telephone Encounter (Signed)
LMOM for patient to call me back 

## 2013-03-08 NOTE — Telephone Encounter (Signed)
The rule applies for all PPIs

## 2013-03-09 NOTE — Telephone Encounter (Signed)
Please advise if ok to send in for Nexium #30

## 2013-03-10 MED ORDER — ESOMEPRAZOLE MAGNESIUM 40 MG PO CPDR: 40.0000 mg | DELAYED_RELEASE_CAPSULE | Freq: Every day | ORAL | Status: DC

## 2013-03-10 NOTE — Telephone Encounter (Signed)
rx sent in electronically 

## 2013-03-10 NOTE — Addendum Note (Signed)
Addended by: Alfred Levins D on: 03/10/2013 11:20 AM   Modules accepted: Orders

## 2013-03-10 NOTE — Telephone Encounter (Signed)
Ok for 90 day supply of Nexium with 3 refills

## 2013-03-12 ENCOUNTER — Ambulatory Visit: Payer: Medicare PPO | Admitting: Internal Medicine

## 2013-03-25 ENCOUNTER — Other Ambulatory Visit: Payer: Self-pay | Admitting: Internal Medicine

## 2013-03-29 ENCOUNTER — Ambulatory Visit: Payer: Medicare PPO | Admitting: Internal Medicine

## 2013-03-30 ENCOUNTER — Ambulatory Visit (INDEPENDENT_AMBULATORY_CARE_PROVIDER_SITE_OTHER): Payer: Medicare PPO | Admitting: Internal Medicine

## 2013-03-30 VITALS — BP 130/80 | HR 60 | Temp 98.1°F | Resp 16 | Ht 67.0 in | Wt 192.0 lb

## 2013-03-30 DIAGNOSIS — J449 Chronic obstructive pulmonary disease, unspecified: Secondary | ICD-10-CM

## 2013-03-30 DIAGNOSIS — R5381 Other malaise: Secondary | ICD-10-CM | POA: Insufficient documentation

## 2013-03-30 DIAGNOSIS — R5383 Other fatigue: Secondary | ICD-10-CM

## 2013-03-30 DIAGNOSIS — J4489 Other specified chronic obstructive pulmonary disease: Secondary | ICD-10-CM

## 2013-03-30 MED ORDER — VARENICLINE TARTRATE 1 MG PO TABS: 1.0000 mg | ORAL_TABLET | Freq: Two times a day (BID) | ORAL | Status: DC

## 2013-03-30 MED ORDER — MIRTAZAPINE 15 MG PO TABS: 22.5000 mg | ORAL_TABLET | Freq: Every day | ORAL | Status: DC

## 2013-03-30 NOTE — Progress Notes (Signed)
Subjective:    Patient ID: Ernest [name], male    DOB: [birth year], 57 y.o.   MRN: [id number]  HPI  57 year old white male with history of coronary artery disease, moderate COPD and ongoing tobacco use for followup. At last visit, patient was doing well using Chantix. He was able to quit smoking but unfortunately restarted smoking . He discontinued Chantix. He denies any adverse effects.  Patient followed by pain management for chronic back pain. He is  taking 60 mg of MS Contin twice daily.  Medication list reviewed in detail.  Patient complains of malaise and fatigue. His symptoms seem to be worse in the afternoon.  Review of Systems Negative for chest pain, intermittent wheezing and cough  Past Medical History  Diagnosis Date  . CAD (coronary artery disease)     Left Main 30% stenosis, LAD 20 - 30 % stenosis, first and second diagonal branchesat 40 - 50%  stenosis with small arteries, circumflex had 30% stenosis in the large obtuse marginal, RCA at 70 - 80%  stenosis [not felt to be occlusive after evaluation with flow wire], distal 50 - 60% stenosis - [name][  . COPD (chronic obstructive pulmonary disease)     Dr. Jetty Duhamel  . Depression   . Anxiety   . Hyperlipidemia   . Chronic insomnia   . OSA (obstructive sleep apnea)     NPSG 09/10/10- AHI 11.3/hr  . Gout   . GERD (gastroesophageal reflux disease)   . PVD (peripheral vascular disease)     (ABI 0.9 on right and 0.89 on left)  severe left external iliac stenosis.  He  had successful stenting of his left external iliac per Dr. Excell Seltzer.  . DDD (degenerative disc disease)   . Hx of colonoscopy   . Hypertension     History   Social History  . Marital Status: Married    Spouse Name: N/A    Number of Children: N/A  . Years of Education: N/A   Occupational History  . Disabled welder    Social History Main Topics  . Smoking status: Current Every Day Smoker -- 0.20 packs/day for 43 years    Types:  Cigarettes    Last Attempt to Quit: 09/23/2012  . Smokeless tobacco: Never Used     Comment: On Chantix. smokes 2- 3 cigarettes daily  . Alcohol Use: No  . Drug Use: No  . Sexually Active: No   Other Topics Concern  . Not on file   Social History Narrative  . No narrative on file    Past Surgical History  Procedure Laterality Date  . Spinal fusion  03/05/2007    L4-L5  . Hip surgery      left  . Arm surgery      left  . Shoulder surgery      right  . C-spine surgery    . Angioplasty      Family History  Problem Relation Age of Onset  . Heart attack Mother   . Heart attack Sister   . Emphysema Sister     No Known Allergies  Current Outpatient Prescriptions on File Prior to Visit  Medication Sig Dispense Refill  . albuterol (PROVENTIL) (2.5 MG/3ML) 0.083% nebulizer solution USE 1 VIAL IN NEBULIZER EVERY 4 HOURS AS NEEDED FOR WHEEZING OR SHORTNESS OF BREATH  75 mL  0  . amLODipine (NORVASC) 10 MG tablet TAKE 1 TABLET (10 MG TOTAL) BY MOUTH DAILY.  30 tablet  6  .  atorvastatin (LIPITOR) 20 MG tablet Take 1 tablet (20 mg total) by mouth daily.  90 tablet  1  . carvedilol (COREG) 25 MG tablet Take 1 tablet (25 mg total) by mouth 2 (two) times daily with a meal.  60 tablet  11  . clonazePAM (KLONOPIN) 1 MG tablet Take 1 mg by mouth at bedtime as needed. For anxiety/insomnia      . esomeprazole (NEXIUM) 40 MG capsule Take 1 capsule (40 mg total) by mouth daily before breakfast.  90 capsule  3  . FLUoxetine (PROZAC) 40 MG capsule Take 40 mg by mouth daily.        . Fluticasone-Salmeterol (ADVAIR DISKUS) 250-50 MCG/DOSE AEPB Inhale 1 puff into the lungs 2 (two) times daily.  1 each  3  . Indacaterol Maleate (ARCAPTA NEOHALER) 75 MCG CAPS Place 1 capsule into inhaler and inhale daily.  30 capsule  5  . losartan (COZAAR) 25 MG tablet Take 1 tablet (25 mg total) by mouth daily.  30 tablet  11  . modafinil (PROVIGIL) 200 MG tablet Take 200 mg by mouth daily.        [name] morphine (MS  CONTIN) 60 MG 12 hr tablet Take 60 mg by mouth 2 (two) times daily.        . Omega-3 Fatty Acids (FISH OIL CONCENTRATE) 1000 MG CAPS Take 1,000 mg by mouth daily.       [name] oxyCODONE-acetaminophen (PERCOCET) 10-325 MG per tablet Take 1 tablet by mouth every 4 (four) hours as needed. For pain      . potassium chloride SA (K-DUR,KLOR-CON) 20 MEQ tablet Take 20 mEq by mouth daily.      . VOLTAREN 1 % GEL Apply 2 g topically once a week.        No current facility-administered medications on file prior to visit.    BP 130/80  Pulse 60  Temp(Src) 98.1 F (36.7 C)  Resp 16  Ht 5\' 7"  (1.702 m)  Wt 192 lb (87.091 kg)  BMI 30.06 kg/m2        Objective:   Physical Exam  Constitutional: He is oriented to person, place, and time. He appears well-developed and well-nourished.  HENT:  Head: Normocephalic and atraumatic.  Cardiovascular: Normal rate, regular rhythm and normal heart sounds.   Pulmonary/Chest: Effort normal.  Prolonged expiration, scattered faint wheezing  Musculoskeletal: He exhibits no edema.  Neurological: He is alert and oriented to person, place, and time. No cranial nerve deficit.  Skin: Skin is warm.  Psychiatric: He has a normal mood and affect. His behavior is normal.          Assessment & Plan:

## 2013-03-30 NOTE — Assessment & Plan Note (Addendum)
Unfortunately patient has resumed smoking. He has faint wheezing on exam. Restart Chantix.  Consider repeat PFTs.  Patient complains of fatigue especially in the afternoon. He takes MS Contin for chronic low back pain. We discussed risk of hypercarbic respiratory failure. Patient advised discussed lowering dose of MS Contin with his pain management specialist.

## 2013-03-30 NOTE — Assessment & Plan Note (Addendum)
Patient experiencing more fatigue and malaise than usual over the past month. His symptoms worse in the afternoon. Decrease Remeron dose to 15 mg. Patient advised to followup with pain management to reduce dose of MS Contin. Also take Abilify at bedtime.  Obtain blood work before next office visit to rule other metabolic abnormalities.

## 2013-03-30 NOTE — Patient Instructions (Addendum)
Talk to your pain doctor about decrease your MS Contin dose to 30 mg twice daily Please complete the following lab tests before your next follow up appointment: BMET - 401.9 CBCD - 496 FLP, LFTs, TSH - 272.4

## 2013-04-22 ENCOUNTER — Other Ambulatory Visit: Payer: Self-pay | Admitting: Internal Medicine

## 2013-04-27 ENCOUNTER — Other Ambulatory Visit (INDEPENDENT_AMBULATORY_CARE_PROVIDER_SITE_OTHER): Payer: Medicare PPO

## 2013-04-27 ENCOUNTER — Other Ambulatory Visit: Payer: Self-pay | Admitting: Internal Medicine

## 2013-04-27 DIAGNOSIS — J449 Chronic obstructive pulmonary disease, unspecified: Secondary | ICD-10-CM

## 2013-04-27 DIAGNOSIS — E785 Hyperlipidemia, unspecified: Secondary | ICD-10-CM

## 2013-04-27 DIAGNOSIS — I10 Essential (primary) hypertension: Secondary | ICD-10-CM

## 2013-04-27 LAB — BASIC METABOLIC PANEL
Calcium: 8.9 mg/dL (ref 8.4–10.5)
GFR: 102.44 mL/min (ref >60.00)
Potassium: 3.3 mEq/L — ABNORMAL LOW (ref 3.5–5.1)
Sodium: 136 mEq/L (ref 135–145)

## 2013-04-27 LAB — HEPATIC FUNCTION PANEL
ALT: 22 U/L (ref 0–53)
Albumin: 3.5 g/dL (ref 3.5–5.2)
Bilirubin, Direct: 0.1 mg/dL (ref 0.0–0.3)
Total Protein: 6.6 g/dL (ref 6.0–8.3)

## 2013-04-27 LAB — LIPID PANEL
Cholesterol: 122 mg/dL (ref 0–200)
HDL: 35.2 mg/dL — ABNORMAL LOW (ref >39.00)
VLDL: 27.6 mg/dL (ref 0.0–40.0)

## 2013-04-27 LAB — TSH: TSH: 1.39 u[IU]/mL (ref 0.35–5.50)

## 2013-04-27 LAB — CBC WITH DIFFERENTIAL/PLATELET
Basophils Relative: 0.4 % (ref 0.0–3.0)
Eosinophils Absolute: 0.3 10*3/uL (ref 0.0–0.7)
Eosinophils Relative: 2.2 % (ref 0.0–5.0)
HCT: 44.7 % (ref 39.0–52.0)
Lymphs Abs: 1.5 10*3/uL (ref 0.7–4.0)
MCHC: 34.1 g/dL (ref 30.0–36.0)
MCV: 88.3 fl (ref 78.0–100.0)
Monocytes Absolute: 0.7 10*3/uL (ref 0.1–1.0)
Neutrophils Relative %: 78.5 % — ABNORMAL HIGH (ref 43.0–77.0)
RBC: 5.06 Mil/uL (ref 4.22–5.81)
WBC: 11.3 10*3/uL — ABNORMAL HIGH (ref 4.5–10.5)

## 2013-04-29 ENCOUNTER — Encounter: Payer: Self-pay | Admitting: Internal Medicine

## 2013-04-29 ENCOUNTER — Ambulatory Visit (INDEPENDENT_AMBULATORY_CARE_PROVIDER_SITE_OTHER): Payer: Medicare PPO | Admitting: Internal Medicine

## 2013-04-29 VITALS — BP 122/64 | HR 64 | Temp 98.6°F | Wt 189.0 lb

## 2013-04-29 DIAGNOSIS — K029 Dental caries, unspecified: Secondary | ICD-10-CM

## 2013-04-29 DIAGNOSIS — J4489 Other specified chronic obstructive pulmonary disease: Secondary | ICD-10-CM

## 2013-04-29 DIAGNOSIS — R7309 Other abnormal glucose: Secondary | ICD-10-CM

## 2013-04-29 DIAGNOSIS — J449 Chronic obstructive pulmonary disease, unspecified: Secondary | ICD-10-CM

## 2013-04-29 DIAGNOSIS — R739 Hyperglycemia, unspecified: Secondary | ICD-10-CM

## 2013-04-29 MED ORDER — AMOXICILLIN 875 MG PO TABS: 875.0000 mg | ORAL_TABLET | Freq: Two times a day (BID) | ORAL | Status: DC

## 2013-04-29 NOTE — Progress Notes (Signed)
Subjective:    Patient ID: Ernest [name], male    DOB: 1956-11-01, 57 y.o.   MRN: [id number]  HPI  57 year old white male with history of coronary artery disease, moderate COPD and tobacco use for followup. Patient restart Chantix. Unfortunately he has decreased smoking but not completely stopped.  Patient reports his malaise and fatigue has improved since reducing dose of Remeron. He also stopped taking Percocet.  Reviewed his recent blood work. He has hyperglycemia.  Patient takes Abilify.  Review of Systems Negative for chest pain.  Complains of right lower dental pain. He is scheduled to see his dentist in 2 weeks. He denies fever or chills.    Past Medical History  Diagnosis Date  . CAD (coronary artery disease)     Left Main 30% stenosis, LAD 20 - 30 % stenosis, first and second diagonal branchesat 40 - 50%  stenosis with small arteries, circumflex had 30% stenosis in the large obtuse marginal, RCA at 70 - 80%  stenosis [not felt to be occlusive after evaluation with flow wire], distal 50 - 60% stenosis - [name][  . COPD (chronic obstructive pulmonary disease)     Dr. Jetty Duhamel  . Depression   . Anxiety   . Hyperlipidemia   . Chronic insomnia   . OSA (obstructive sleep apnea)     NPSG 09/10/10- AHI 11.3/hr  . Gout   . GERD (gastroesophageal reflux disease)   . PVD (peripheral vascular disease)     (ABI 0.9 on right and 0.89 on left)  severe left external iliac stenosis.  He  had successful stenting of his left external iliac per Dr. Excell Seltzer.  . DDD (degenerative disc disease)   . Hx of colonoscopy   . Hypertension     History   Social History  . Marital Status: Married    Spouse Name: N/A    Number of Children: N/A  . Years of Education: N/A   Occupational History  . Disabled welder    Social History Main Topics  . Smoking status: Current Every Day Smoker -- 0.20 packs/day for 43 years    Types: Cigarettes    Last Attempt to Quit: 09/23/2012   . Smokeless tobacco: Never Used     Comment: On Chantix. smokes 2- 3 cigarettes daily  . Alcohol Use: No  . Drug Use: No  . Sexually Active: No   Other Topics Concern  . Not on file   Social History Narrative  . No narrative on file    Past Surgical History  Procedure Laterality Date  . Spinal fusion  03/05/2007    L4-L5  . Hip surgery      left  . Arm surgery      left  . Shoulder surgery      right  . C-spine surgery    . Angioplasty      Family History  Problem Relation Age of Onset  . Heart attack Mother   . Heart attack Sister   . Emphysema Sister     No Known Allergies  Current Outpatient Prescriptions on File Prior to Visit  Medication Sig Dispense Refill  . albuterol (PROVENTIL) (2.5 MG/3ML) 0.083% nebulizer solution USE 1 VIAL IN NEBULIZER EVERY 4 HOURS AS NEEDED FOR WHEEZING OR SHORTNESS OF BREATH  75 mL  0  . amLODipine (NORVASC) 10 MG tablet TAKE 1 TABLET (10 MG TOTAL) BY MOUTH DAILY.  30 tablet  6  . ARCAPTA NEOHALER 75 MCG CAPS PLACE 1 CAPSULE  INTO INHALER AND INHALE DAILY.  30 capsule  5  . ARIPiprazole (ABILIFY) 5 MG tablet Take 1 tablet (5 mg total) by mouth at bedtime.      [name] atorvastatin (LIPITOR) 20 MG tablet Take 1 tablet (20 mg total) by mouth daily.  90 tablet  1  . carvedilol (COREG) 25 MG tablet Take 1 tablet (25 mg total) by mouth 2 (two) times daily with a meal.  60 tablet  11  . clonazePAM (KLONOPIN) 1 MG tablet Take 1 mg by mouth at bedtime as needed. For anxiety/insomnia      . esomeprazole (NEXIUM) 40 MG capsule Take 1 capsule (40 mg total) by mouth daily before breakfast.  90 capsule  3  . FLUoxetine (PROZAC) 40 MG capsule Take 40 mg by mouth daily.        . Fluticasone-Salmeterol (ADVAIR DISKUS) 250-50 MCG/DOSE AEPB Inhale 1 puff into the lungs 2 (two) times daily.  1 each  3  . losartan (COZAAR) 25 MG tablet Take 1 tablet (25 mg total) by mouth daily.  30 tablet  11  . mirtazapine (REMERON) 15 MG tablet Take 1.5 tablets (22.5 mg  total) by mouth at bedtime.  90 tablet  1  . modafinil (PROVIGIL) 200 MG tablet Take 200 mg by mouth daily.        [name] morphine (MS CONTIN) 60 MG 12 hr tablet Take 60 mg by mouth 2 (two) times daily.        . Omega-3 Fatty Acids (FISH OIL CONCENTRATE) 1000 MG CAPS Take 1,000 mg by mouth daily.       . potassium chloride SA (K-DUR,KLOR-CON) 20 MEQ tablet Take 20 mEq by mouth daily.      . varenicline (CHANTIX CONTINUING MONTH PAK) 1 MG tablet Take 1 tablet (1 mg total) by mouth 2 (two) times daily.  60 tablet  5  . VOLTAREN 1 % GEL Apply 2 g topically once a week.        No current facility-administered medications on file prior to visit.    BP 122/64  Pulse 64  Temp(Src) 98.6 F (37 C) (Oral)  Wt 189 lb (85.73 kg)  BMI 29.59 kg/m2    Objective:   Physical Exam  Constitutional: He is oriented to person, place, and time. He appears well-developed and well-nourished.  HENT:  Head: Normocephalic and atraumatic.  Poor dentition, dental caries right lower jaw. Mild tenderness of right lower jaw.  No neck tenderness  Neck: Neck supple.  Cardiovascular: Normal rate, regular rhythm and normal heart sounds.   Pulmonary/Chest: Effort normal.  Scattered expiratory wheeze  Musculoskeletal: He exhibits no edema.  Lymphadenopathy:    He has no cervical adenopathy.  Neurological: He is alert and oriented to person, place, and time. No cranial nerve deficit.  Skin: Skin is warm and dry.  Psychiatric: He has a normal mood and affect. His behavior is normal.          Assessment & Plan:

## 2013-04-29 NOTE — Assessment & Plan Note (Signed)
Patient has symptomatic dental caries right lower jaw. He is scheduled to followup with his dentist in 2 weeks. Use amoxicillin 875 mg twice daily as directed. Patient understands to notify his dentist if his symptoms worsen.

## 2013-04-29 NOTE — Assessment & Plan Note (Signed)
Patient able to reduce smoking but has not been able to completely quit on Chantix. I strongly urged complete tobacco cessation. Fortunately he has been able to stop Percocet. He is taking same dose of MS Contin for low back pain.

## 2013-04-29 NOTE — Assessment & Plan Note (Signed)
Patient taking Abilify. We discussed his higher risk of developing type 2 diabetes. Patient understands to avoid sweets and decrease carbohydrate intake. Monitor A1c.

## 2013-04-29 NOTE — Patient Instructions (Addendum)
Please complete the following lab tests before your next follow up appointment: BMET, A1c - 790.29 Avoid concentrated sweets and sugary beverages.  Reduce your carbohydrate intake.

## 2013-05-09 ENCOUNTER — Other Ambulatory Visit: Payer: Self-pay | Admitting: Cardiology

## 2013-05-17 ENCOUNTER — Encounter: Payer: Self-pay | Admitting: Internal Medicine

## 2013-05-17 ENCOUNTER — Ambulatory Visit (INDEPENDENT_AMBULATORY_CARE_PROVIDER_SITE_OTHER): Payer: Medicare PPO | Admitting: Internal Medicine

## 2013-05-17 VITALS — BP 122/74 | HR 65 | Ht 66.0 in | Wt 193.2 lb

## 2013-05-17 DIAGNOSIS — G4733 Obstructive sleep apnea (adult) (pediatric): Secondary | ICD-10-CM

## 2013-05-17 DIAGNOSIS — F17200 Nicotine dependence, unspecified, uncomplicated: Secondary | ICD-10-CM

## 2013-05-17 DIAGNOSIS — J449 Chronic obstructive pulmonary disease, unspecified: Secondary | ICD-10-CM

## 2013-05-17 DIAGNOSIS — J4489 Other specified chronic obstructive pulmonary disease: Secondary | ICD-10-CM

## 2013-05-17 NOTE — Progress Notes (Signed)
Patient ID: Ernest [name], male    DOB: 03-Mar-1956, 57 y.o.   MRN: [id number]  HPI 07/30/11- 40 yoM smoker followed for COPD/ bronchitis, OSA/ CPAP Last here 04/01/11- note reviewed. He had felt better on a trial of maintenance prednisone. He has felt more short of breath through the hot summer, with no acute event and without blood, chest pain or fever. Easy DOE around home. Has been sharing nebulizer with his wife, who now has a cold and we discussed this. Has been worse in last month, and expecially in recent rainy weather. Noting a little ankle swelling. Smothered trying to take a shower.  Only tolerating his CPAP about 3 nights/ week because he feels smothered, waking sore in anterior chest in the mornings.  Now down to only 2-3 cigarettes/ day and trying to stop.  CXR 08/14/10- Nl NAD PFT 05/26/08- FEV1/FVC 2.44/ 0.76, incr 19% w/ BD, R 0.59 ?DLCO 0.69  09/10/11- 55 yoM smoker followed for COPD/ bronchitis, OSA/ CPAP He reports feeling better "finally doing okay". He is down to just a couple of cigarettes a day. He is using CPAP regularly now and is comfortable with it. Alpha I antitrypsin Report came back normal "M. M." on 08/09/2011. Chest x-ray from 08/15/2011 showed stable changes of COPD. He has had flu vaccine.  11/12/11- 46 yoM smoker followed for COPD/ bronchitis, OSA/ CPAP Here with wife. Has had flu shot. He says he was doing well until 2 weeks ago. He again is coughing thick mucus which is difficult to clear. He depends on his metered inhaler and nebulizer to help clear his airways. Started Mucinex 3 days ago. Mucus is white. He denies fever, sore throat, chest pain, GI upset. Because of orthopnea he is sitting up to sleep for the past 2 nights.  01/14/12- 55 yoM smoker followed for COPD/ bronchitis, OSA/ CPAP Increase congested cough green to yellow sputum but he does not feel sick and denies fever. Sister who was a smoker recently died of lung cancer. Despite that and all of our  discussions, he still smokes a few cigarettes daily. We talked again about motivation to stop completely. Continues CPAP and Provigil one or 2 daily.  03/10/12  56 yoM smoker followed for COPD/ bronchitis, OSA/ CPAP Persistent bronchitic cough. Sputum is not purulent. He is "working on" his smoking habit but blames pollen for keeping him S. coughing now. Remains fully compliant with CPAP, all night every night. Still needs Provigil to help with residual daytime sleepiness as before.  05/12/12- 56 yoM smoker followed for COPD/ bronchitis, OSA/ CPAP Stays SOB all the time-even at rest; wheezing, coughing-productive-yellow in color, chest congestion, nasal congestion,. He is more interested today in smoking cessation which we discussed. He had quit for Daliresp after 10 days because of sustained nausea. He is taking all of his regular medicines and using his rescue inhaler several times a day. Off prednisone now for 1-2 months. CXR 01/23/12-  IMPRESSION:  No acute cardiopulmonary abnormality.  Original Report Authenticated By: Randall An, M.D.   07/13/12- 45 yoM smoker followed for COPD/ bronchitis, OSA/ CPAP, complicated by CAD Pt states increased sob,wheezing,productive cough x3 months. Still smoking with no serious effort to stop. His sister has lung cancer which has gotten his attention and he may be willing to try harder. Some days his breathing is better than others with no real trend. Coughing productive of white sputum with no blood. He has all of his medications currently.  07/30/12- 10 yoM  smoker followed for COPD/ bronchitis, OSA/ CPAP, complicated by CAD Cough-productive-yellow and thick; SOB, wheezing, chest congestion, head congestion x 1 week; O2 sat of 83% RA entered room-20 seconds RA sat of 90% CT 07/21/12-  IMPRESSION:  1. Tiny bilateral lung nodules which are similar to on the prior  exam, given differences in slice selection and measurement error.  Likely subpleural lymph  nodes.  2. Age advanced coronary artery atherosclerosis. Recommend  assessment of coronary risk factors and consideration of medical  therapy.  3. Slight enlargement of splenic lesion since 2007. Favored to  represent a hemangioma or lymphangioma.  Original Report Authenticated By: Consuello Bossier, M.D.   08/07/12- 7 yoM smoker followed for COPD/ bronchitis, OSA/ CPAP, complicated by CAD, disability for back pain. Cough-productive-white mostly; SOB , wheezing-worse at night     wife here Coughing spasms he can feel his upper airway is closing off. He he still smokes a little even when he feels badly and we went over this again today offering support.  11/13/12- 56 yoM smoker followed for COPD/ bronchitis, OSA/ CPAP, complicated by CAD, disability for back pain. FOLLOWS ZOX:WRUEA still but not productive-more SOB  Went to ER in September or acute exacerbation of COPD but not admitted. Today he is a scheduled visit. He actually says he is doing pretty well with no acute problems. Perennial nasal congestion. He has reduced his cigarette smoking to about 2 per day and is strongly encouraged to stop completely now.  01/25/13-  57 yoM smoker followed for COPD/ bronchitis, OSA/ CPAP, complicated by CAD, disability for back pain. ACUTE VISIT: started smoking agian-discuss taking Chantix; feels raw in chest area with deep cough x 2 weeks. He feels well at this visit, meaning he is at his baseline some daily nonproductive cough and shortness of breath with exertion.  05/17/13-57 yoM smoker followed for COPD/ bronchitis, OSA/ CPAP, complicated by CAD, disability for back pain. FOLLOWS VWU:JWJXBJYNW is about the same as last visit; has lessened the about the amount he is smoking. Chantix helps. Continues CPAP/ 10/[employer] Discussed smoking cessation effort.  Review of Systems- see HPI Constitutional:   No-   weight loss, night sweats, fevers, chills,+ fatigue, lassitude. HEENT:   No-  headaches, difficulty  swallowing, tooth/dental problems, sore throat,       No-  sneezing, itching, ear ache, nasal congestion, post nasal drip,  CV:   No chest pain, orthopnea, PND,  Little swelling in lower extremities,  dizziness, palpitations Resp: +  shortness of breath with exertion or at rest.             Little productive cough,  + non-productive cough,  No-  coughing up of blood.              No-   change in color of mucus.  +some wheezing.   Skin: No-   rash or lesions. GI:  No-   heartburn, indigestion, abdominal pain, nausea, vomiting, GU:  MS:  No-   joint pain or swelling.   Neuro- nothing unusual  Psych:  No- change in mood or affect. No depression or anxiety.  No memory loss.    Objective:   Physical Exam General- Alert, Oriented, Affect-appropriate, Distress- none acute   Stocky/ overweight, full beard Skin- rash-none, lesions- none, excoriation- none. Tanned     Lymphadenopathy- none Head- atraumatic            Eyes- Gross vision intact, PERRLA, conjunctivae clear secretions  Ears- Hearing, canals- normal            Nose- Clear, No- Septal dev, mucus, polyps, erosion, perforation             Throat- Mallampati III , mucosa red , drainage- none, tonsils- atrophic, hoarse Neck- flexible , trachea midline, no stridor , thyroid nl, carotid no bruit Chest - symmetrical excursion , unlabored           Heart/CV- RRR , no murmur , no gallop  , no rub, nl s1 s2                           - JVD- none , edema-none, stasis changes- none, varices- none           Lung-+ distant /Coarse, unlabored,  No- wheeze, no- cough , dullness-none, rub- none           Chest wall-  Abd-  Br/ Gen/ Rectal- Not done, not indicated Extrem- cyanosis- none, clubbing, none, atrophy- none, strength- nl. 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 Neuro- grossly intact to observation

## 2013-05-17 NOTE — Patient Instructions (Addendum)
Please keep working on the smoking- I am really proud of your effort  Please call as needed

## 2013-05-30 NOTE — Assessment & Plan Note (Signed)
Good compliance and control 

## 2013-05-30 NOTE — Assessment & Plan Note (Signed)
Using Chantix, trying to get off the last few daily cigarettes. Education and support given

## 2013-05-30 NOTE — Assessment & Plan Note (Signed)
Emphasis on completion of smoking cessation

## 2013-06-08 ENCOUNTER — Telehealth: Payer: Self-pay | Admitting: Internal Medicine

## 2013-06-08 MED ORDER — PREDNISONE 10 MG PO TABS: ORAL_TABLET | ORAL | Status: DC

## 2013-06-08 MED ORDER — AMOXICILLIN-POT CLAVULANATE 875-125 MG PO TABS: 1.0000 | ORAL_TABLET | Freq: Two times a day (BID) | ORAL | Status: DC

## 2013-06-08 NOTE — Telephone Encounter (Signed)
Called spoke with pt's spouse who reports that pt c/o head congestion and prod cough both with yellow/green mucus, PND, deep cough, increased SOB, wheezing, some chest tightness and chills x2 days.  Denies fever, n/v.    Spouse had requested work-in appt with CY today - no openings this morning.  Spouse okay with rx being called in.  Dr Maple Hudson please advise, thank you.  Last ov 6.23.14 w/ CDY No Known Allergies - verified [facility]

## 2013-06-08 NOTE — Telephone Encounter (Signed)
Per CDY: okay for Augmentin 875mg  #14 1poBID and prednisone 8 day taper (4x2, 3x2, 2x2, 1x2 and stop).  Called spoke with patient's spouse, advised of CY's recs as stated above.  Spouse verbalized her understanding and denied any questions.  Rx sent to verified pharmacy.  Nothing further needed at this time; will sign off.

## 2013-06-10 ENCOUNTER — Encounter: Payer: Self-pay | Admitting: Internal Medicine

## 2013-06-10 ENCOUNTER — Ambulatory Visit (INDEPENDENT_AMBULATORY_CARE_PROVIDER_SITE_OTHER): Payer: Medicare PPO | Admitting: Internal Medicine

## 2013-06-10 VITALS — BP 144/60 | HR 77 | Ht 66.0 in | Wt 188.0 lb

## 2013-06-10 DIAGNOSIS — J449 Chronic obstructive pulmonary disease, unspecified: Secondary | ICD-10-CM

## 2013-06-10 DIAGNOSIS — F17200 Nicotine dependence, unspecified, uncomplicated: Secondary | ICD-10-CM

## 2013-06-10 DIAGNOSIS — G4733 Obstructive sleep apnea (adult) (pediatric): Secondary | ICD-10-CM

## 2013-06-10 DIAGNOSIS — R911 Solitary pulmonary nodule: Secondary | ICD-10-CM

## 2013-06-10 MED ORDER — METHYLPREDNISOLONE ACETATE 80 MG/ML IJ SUSP
80.0000 mg | Freq: Once | INTRAMUSCULAR | Status: AC
  Administered 2013-06-10: 80 mg via INTRAMUSCULAR

## 2013-06-10 MED ORDER — LEVALBUTEROL HCL 0.63 MG/3ML IN NEBU
0.6300 mg | INHALATION_SOLUTION | Freq: Once | RESPIRATORY_TRACT | Status: AC
  Administered 2013-06-10: 0.63 mg via RESPIRATORY_TRACT

## 2013-06-10 MED ORDER — ALBUTEROL SULFATE (2.5 MG/3ML) 0.083% IN NEBU: INHALATION_SOLUTION | RESPIRATORY_TRACT | Status: DC

## 2013-06-10 NOTE — Patient Instructions (Addendum)
Neb xop 0.63  Depo 80  Finish the prednisone taper and augmentin you just started  Extra fluids, avoid chills, and avoid getting close to sick people- especially children  We may want to retry Daliresp later  Script sent refilling nebulizer medicine

## 2013-06-10 NOTE — Progress Notes (Signed)
Patient ID: Ernest [name], male    DOB: 03-Mar-1956, 57 y.o.   MRN: [id number]  HPI 07/30/11- 40 yoM smoker followed for COPD/ bronchitis, OSA/ CPAP Last here 04/01/11- note reviewed. He had felt better on a trial of maintenance prednisone. He has felt more short of breath through the hot summer, with no acute event and without blood, chest pain or fever. Easy DOE around home. Has been sharing nebulizer with his wife, who now has a cold and we discussed this. Has been worse in last month, and expecially in recent rainy weather. Noting a little ankle swelling. Smothered trying to take a shower.  Only tolerating his CPAP about 3 nights/ week because he feels smothered, waking sore in anterior chest in the mornings.  Now down to only 2-3 cigarettes/ day and trying to stop.  CXR 08/14/10- Nl NAD PFT 05/26/08- FEV1/FVC 2.44/ 0.76, incr 19% w/ BD, R 0.59 ?DLCO 0.69  09/10/11- 55 yoM smoker followed for COPD/ bronchitis, OSA/ CPAP He reports feeling better "finally doing okay". He is down to just a couple of cigarettes a day. He is using CPAP regularly now and is comfortable with it. Alpha I antitrypsin Report came back normal "M. M." on 08/09/2011. Chest x-ray from 08/15/2011 showed stable changes of COPD. He has had flu vaccine.  11/12/11- 46 yoM smoker followed for COPD/ bronchitis, OSA/ CPAP Here with wife. Has had flu shot. He says he was doing well until 2 weeks ago. He again is coughing thick mucus which is difficult to clear. He depends on his metered inhaler and nebulizer to help clear his airways. Started Mucinex 3 days ago. Mucus is white. He denies fever, sore throat, chest pain, GI upset. Because of orthopnea he is sitting up to sleep for the past 2 nights.  01/14/12- 55 yoM smoker followed for COPD/ bronchitis, OSA/ CPAP Increase congested cough green to yellow sputum but he does not feel sick and denies fever. Sister who was a smoker recently died of lung cancer. Despite that and all of our  discussions, he still smokes a few cigarettes daily. We talked again about motivation to stop completely. Continues CPAP and Provigil one or 2 daily.  03/10/12  56 yoM smoker followed for COPD/ bronchitis, OSA/ CPAP Persistent bronchitic cough. Sputum is not purulent. He is "working on" his smoking habit but blames pollen for keeping him S. coughing now. Remains fully compliant with CPAP, all night every night. Still needs Provigil to help with residual daytime sleepiness as before.  05/12/12- 56 yoM smoker followed for COPD/ bronchitis, OSA/ CPAP Stays SOB all the time-even at rest; wheezing, coughing-productive-yellow in color, chest congestion, nasal congestion,. He is more interested today in smoking cessation which we discussed. He had quit for Daliresp after 10 days because of sustained nausea. He is taking all of his regular medicines and using his rescue inhaler several times a day. Off prednisone now for 1-2 months. CXR 01/23/12-  IMPRESSION:  No acute cardiopulmonary abnormality.  Original Report Authenticated By: Randall An, M.D.   07/13/12- 45 yoM smoker followed for COPD/ bronchitis, OSA/ CPAP, complicated by CAD Pt states increased sob,wheezing,productive cough x3 months. Still smoking with no serious effort to stop. His sister has lung cancer which has gotten his attention and he may be willing to try harder. Some days his breathing is better than others with no real trend. Coughing productive of white sputum with no blood. He has all of his medications currently.  07/30/12- 10 yoM  smoker followed for COPD/ bronchitis, OSA/ CPAP, complicated by CAD Cough-productive-yellow and thick; SOB, wheezing, chest congestion, head congestion x 1 week; O2 sat of 83% RA entered room-20 seconds RA sat of 90% CT 07/21/12-  IMPRESSION:  1. Tiny bilateral lung nodules which are similar to on the prior  exam, given differences in slice selection and measurement error.  Likely subpleural lymph  nodes.  2. Age advanced coronary artery atherosclerosis. Recommend  assessment of coronary risk factors and consideration of medical  therapy.  3. Slight enlargement of splenic lesion since 2007. Favored to  represent a hemangioma or lymphangioma.  Original Report Authenticated By: Consuello Bossier, M.D.   08/07/12- 25 yoM smoker followed for COPD/ bronchitis, OSA/ CPAP, complicated by CAD, disability for back pain. Cough-productive-white mostly; SOB , wheezing-worse at night     wife here Coughing spasms he can feel his upper airway is closing off. He he still smokes a little even when he feels badly and we went over this again today offering support.  11/13/12- 56 yoM smoker followed for COPD/ bronchitis, OSA/ CPAP, complicated by CAD, disability for back pain. FOLLOWS ZOX:WRUEA still but not productive-more SOB  Went to ER in September or acute exacerbation of COPD but not admitted. Today he is a scheduled visit. He actually says he is doing pretty well with no acute problems. Perennial nasal congestion. He has reduced his cigarette smoking to about 2 per day and is strongly encouraged to stop completely now.  01/25/13-  57 yoM smoker followed for COPD/ bronchitis, OSA/ CPAP, complicated by CAD, disability for back pain. ACUTE VISIT: started smoking agian-discuss taking Chantix; feels raw in chest area with deep cough x 2 weeks. He feels well at this visit, meaning he is at his baseline some daily nonproductive cough and shortness of breath with exertion.  05/17/13-57 yoM smoker followed for COPD/ bronchitis, OSA/ CPAP, complicated by CAD, disability for back pain. FOLLOWS VWU:JWJXBJYNW is about the same as last visit; has lessened the about the amount he is smoking. Chantix helps. Continues CPAP/ 10/[employer] Discussed smoking cessation effort.  06/10/13- 57 yoM smoker followed for COPD/ bronchitis, OSA/ CPAP, complicated by CAD, disability for back pain. ACUTE VISIT: prod cough with yellow/green  mucus, increased SOB, wheezing, tightness in chest, chills x1 week.  still on augmentin and pred taper from 7/15 phone note > no better Trying Chantix. Wife is also sick. There exposed to his brother's child who has frequent colds. Just started prednisone taper and Augmentin yesterday. Had teeth pulled.  Review of Systems- see HPI Constitutional:   No-   weight loss, night sweats, fevers, chills,+ fatigue, lassitude. HEENT:   No-  headaches, difficulty swallowing, tooth/dental problems, +sore throat,       No-  sneezing, itching, ear ache, +nasal congestion, post nasal drip,  CV:   No chest pain, orthopnea, PND,  Little swelling in lower extremities,  dizziness, palpitations Resp: +  shortness of breath with exertion or at rest.             Little productive cough,  + non-productive cough,  No-  coughing up of blood.              No-   change in color of mucus.  +some wheezing.   Skin: No-   rash or lesions. GI:  No-   heartburn, indigestion, abdominal pain, nausea, vomiting, GU:  MS:  No-   joint pain or swelling.   Neuro- nothing unusual  Psych:  No- change in mood or affect. No depression or anxiety.  No memory loss.    Objective:   Physical Exam General- Alert, Oriented, Affect-appropriate, Distress- none acute   Stocky/ overweight, full beard Skin- rash-none, lesions- none, excoriation- none. Tanned     Lymphadenopathy- none Head- atraumatic            Eyes- Gross vision intact, PERRLA, conjunctivae clear secretions            Ears- Hearing, canals- normal            Nose- +turbinate edema, No- Septal dev, +mucus bridging, no-polyps, erosion, perforation             Throat- Mallampati III , mucosa red , drainage- none, tonsils- atrophic, hoarse Neck- flexible , trachea midline, no stridor , thyroid nl, carotid no bruit Chest - symmetrical excursion , unlabored           Heart/CV- RRR , no murmur , no gallop  , no rub, nl s1 s2                           - JVD- none , edema-none,  stasis changes- none, varices- none           Lung-+ distant /Coarse, unlabored,  + wheeze, no- cough , dullness-none, rub- none           Chest wall-  Abd-  Br/ Gen/ Rectal- Not done, not indicated Extrem- cyanosis- none, clubbing, none, atrophy- none, strength- nl. 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 Neuro- grossly intact to observation

## 2013-06-14 ENCOUNTER — Telehealth: Payer: Self-pay | Admitting: Internal Medicine

## 2013-06-14 MED ORDER — CLARITHROMYCIN 500 MG PO TABS: 500.0000 mg | ORAL_TABLET | Freq: Two times a day (BID) | ORAL | Status: DC

## 2013-06-14 NOTE — Telephone Encounter (Signed)
Spoke with the pt's spouse  Pt seen here by CDY on 06/10/13 with the following instrcutions            Neb xop 0.63  Depo 80  Finish the prednisone taper and augmentin you just started  Extra fluids, avoid chills, and avoid getting close to sick people- especially children  We may want to retry Daliresp later  Script sent refilling nebulizer medicine     Spouse reports that he does not feel like abx is helping Still has prod cough with minimal green sputum and head and chest congestion  No fever Please advise, thanks! No Known Allergies

## 2013-06-14 NOTE — Telephone Encounter (Signed)
Per CY- Biaxin 500mg  #14 1 BID Mucinex DM  Do not take Lipitor or Advair while taking abx  Spoke with patients spouse made her aware of this and nothing further needed at this time

## 2013-06-27 ENCOUNTER — Encounter: Payer: Self-pay | Admitting: Internal Medicine

## 2013-06-27 NOTE — Assessment & Plan Note (Signed)
He is trying Chantix. I gave strong encouragement.

## 2013-06-27 NOTE — Assessment & Plan Note (Signed)
With his smoking history, we will watch chest x-ray

## 2013-06-27 NOTE — Assessment & Plan Note (Signed)
Continues good compliance and control. He had no questions at this visit.

## 2013-06-27 NOTE — Assessment & Plan Note (Signed)
Acute exacerbation of COPD. Clinically an acute bronchitis. Plan-nebs Xopenex, Depo-Medrol. Finish prednisone and Augmentin. We will consider retrying Daliresp

## 2013-06-29 ENCOUNTER — Other Ambulatory Visit: Payer: Medicare PPO

## 2013-07-06 ENCOUNTER — Ambulatory Visit: Payer: Medicare PPO | Admitting: Internal Medicine

## 2013-07-16 ENCOUNTER — Other Ambulatory Visit (INDEPENDENT_AMBULATORY_CARE_PROVIDER_SITE_OTHER): Payer: Medicare PPO

## 2013-07-16 DIAGNOSIS — R7309 Other abnormal glucose: Secondary | ICD-10-CM

## 2013-07-16 LAB — BASIC METABOLIC PANEL
BUN: 11 mg/dL (ref 6–23)
Calcium: 9.1 mg/dL (ref 8.4–10.5)
Chloride: 99 mEq/L (ref 96–112)
Creatinine, Ser: 0.9 mg/dL (ref 0.4–1.5)
GFR: 94.35 mL/min (ref >60.00)

## 2013-07-16 LAB — HEMOGLOBIN A1C: Hgb A1c MFr Bld: 6.3 % (ref 4.6–6.5)

## 2013-07-22 ENCOUNTER — Encounter: Payer: Self-pay | Admitting: Internal Medicine

## 2013-07-22 ENCOUNTER — Ambulatory Visit (INDEPENDENT_AMBULATORY_CARE_PROVIDER_SITE_OTHER): Payer: Medicare PPO | Admitting: Internal Medicine

## 2013-07-22 VITALS — BP 132/70 | HR 84 | Temp 99.0°F | Wt 191.0 lb

## 2013-07-22 DIAGNOSIS — R739 Hyperglycemia, unspecified: Secondary | ICD-10-CM

## 2013-07-22 DIAGNOSIS — I10 Essential (primary) hypertension: Secondary | ICD-10-CM

## 2013-07-22 DIAGNOSIS — Z23 Encounter for immunization: Secondary | ICD-10-CM

## 2013-07-22 DIAGNOSIS — R7309 Other abnormal glucose: Secondary | ICD-10-CM

## 2013-07-22 DIAGNOSIS — J449 Chronic obstructive pulmonary disease, unspecified: Secondary | ICD-10-CM

## 2013-07-22 MED ORDER — ATORVASTATIN CALCIUM 20 MG PO TABS: 20.0000 mg | ORAL_TABLET | Freq: Every day | ORAL | Status: DC

## 2013-07-22 MED ORDER — LOSARTAN POTASSIUM 25 MG PO TABS: 25.0000 mg | ORAL_TABLET | Freq: Every day | ORAL | Status: DC

## 2013-07-22 MED ORDER — CARVEDILOL 25 MG PO TABS: 25.0000 mg | ORAL_TABLET | Freq: Two times a day (BID) | ORAL | Status: DC

## 2013-07-22 MED ORDER — DOXYCYCLINE HYCLATE 100 MG PO TABS: 100.0000 mg | ORAL_TABLET | Freq: Two times a day (BID) | ORAL | Status: DC

## 2013-07-22 NOTE — Patient Instructions (Addendum)
Avoid sweets and decrease your carbohydrate intake to 40 grams per meal. Please complete the following lab tests before your next follow up appointment: BMET, A1c - 790.29 Contact our office if your cough persists or worsens.

## 2013-07-22 NOTE — Assessment & Plan Note (Addendum)
Stable.  No change in medication.  BP: 132/70 mmHg  Lab Results  Component Value Date   CREATININE 0.9 07/16/2013   Lab Results  Component Value Date   NA 138 07/16/2013   K 3.7 07/16/2013   CL 99 07/16/2013   CO2 31 07/16/2013

## 2013-07-22 NOTE — Assessment & Plan Note (Signed)
A1c is slightly elevated. Patient understands he will have to discontinue Abilify if A1c gets worse. Continue dietary/last management. Monitor A1c.

## 2013-07-22 NOTE — Assessment & Plan Note (Signed)
Patient treated on 06/27/2013 for COPD exacerbation. No improvement with Augmentin and prednisone. Treat with doxycycline 100 mg twice daily. Continue same inhalers.  Patient advised to call office if symptoms persist or worsen.

## 2013-07-22 NOTE — Progress Notes (Signed)
Subjective:    Patient ID: Ernest [name], male    DOB: 03/21/1956, 57 y.o.   MRN: [id number]  HPI  57 year old white male with history of coronary artery disease, moderate COPD and abnormal glucose for followup. Patient reports he stopped smoking 4 weeks ago. He was recently seen by his pulmonologist and started on 2 antibiotics for COPD exacerbation. He also was prescribed prednisone taper. His cough has not fully resolved.  Patient still taking Abilify. His A1c elevated at 6.3. Previous A1c 3 years ago were normal.  Hypertension-stable  Review of Systems Cough productive of whitish sputum, wheezing No shortness of breath, no fever or chills  Past Medical History  Diagnosis Date  . CAD (coronary artery disease)     Left Main 30% stenosis, LAD 20 - 30 % stenosis, first and second diagonal branchesat 40 - 50%  stenosis with small arteries, circumflex had 30% stenosis in the large obtuse marginal, RCA at 70 - 80%  stenosis [not felt to be occlusive after evaluation with flow wire], distal 50 - 60% stenosis - [name][  . COPD (chronic obstructive pulmonary disease)     Dr. Jetty Duhamel  . Depression   . Anxiety   . Hyperlipidemia   . Chronic insomnia   . OSA (obstructive sleep apnea)     NPSG 09/10/10- AHI 11.3/hr  . Gout   . GERD (gastroesophageal reflux disease)   . PVD (peripheral vascular disease)     (ABI 0.9 on right and 0.89 on left)  severe left external iliac stenosis.  He  had successful stenting of his left external iliac per Dr. Excell Seltzer.  . DDD (degenerative disc disease)   . Hx of colonoscopy   . Hypertension   . COPD with asthma 09/08/2007    History   Social History  . Marital Status: Married    Spouse Name: N/A    Number of Children: N/A  . Years of Education: N/A   Occupational History  . Disabled welder    Social History Main Topics  . Smoking status: Former Smoker -- 0.20 packs/day for 43 years    Types: Cigarettes    Quit date:  06/24/2013  . Smokeless tobacco: Never Used     Comment: On Chantix. smokes 2- 3 cigarettes daily  . Alcohol Use: No  . Drug Use: No  . Sexual Activity: No   Other Topics Concern  . Not on file   Social History Narrative  . No narrative on file    Past Surgical History  Procedure Laterality Date  . Spinal fusion  03/05/2007    L4-L5  . Hip surgery      left  . Arm surgery      left  . Shoulder surgery      right  . C-spine surgery    . Angioplasty      Family History  Problem Relation Age of Onset  . Heart attack Mother   . Heart attack Sister   . Emphysema Sister     No Known Allergies  Current Outpatient Prescriptions on File Prior to Visit  Medication Sig Dispense Refill  . albuterol (PROVENTIL) (2.5 MG/3ML) 0.083% nebulizer solution USE 1 VIAL IN NEBULIZER EVERY 4 HOURS AS NEEDED FOR WHEEZING OR SHORTNESS OF BREATH  75 mL  prn  . amLODipine (NORVASC) 10 MG tablet TAKE 1 TABLET (10 MG TOTAL) BY MOUTH DAILY.  30 tablet  6  . ARCAPTA NEOHALER 75 MCG CAPS PLACE 1 CAPSULE INTO INHALER  AND INHALE DAILY.  30 capsule  5  . ARIPiprazole (ABILIFY) 5 MG tablet Take 1 tablet (5 mg total) by mouth at bedtime.      . clonazePAM (KLONOPIN) 1 MG tablet Take 1 mg by mouth at bedtime as needed. For anxiety/insomnia      . esomeprazole (NEXIUM) 40 MG capsule Take 1 capsule (40 mg total) by mouth daily before breakfast.  90 capsule  3  . FLUoxetine (PROZAC) 40 MG capsule Take 40 mg by mouth daily.        . Fluticasone-Salmeterol (ADVAIR DISKUS) 250-50 MCG/DOSE AEPB Inhale 1 puff into the lungs 2 (two) times daily.  1 each  3  . gabapentin (NEURONTIN) 100 MG capsule Take 1 capsule by mouth daily.      . mirtazapine (REMERON) 15 MG tablet Take 1.5 tablets (22.5 mg total) by mouth at bedtime.  90 tablet  1  . modafinil (PROVIGIL) 200 MG tablet Take 200 mg by mouth daily.        [name] morphine (MS CONTIN) 60 MG 12 hr tablet Take 60 mg by mouth 2 (two) times daily.        . Omega-3 Fatty  Acids (FISH OIL CONCENTRATE) 1000 MG CAPS Take 1,000 mg by mouth daily.       . potassium chloride SA (K-DUR,KLOR-CON) 20 MEQ tablet Take 20 mEq by mouth daily.      . varenicline (CHANTIX CONTINUING MONTH PAK) 1 MG tablet Take 1 tablet (1 mg total) by mouth 2 (two) times daily.  60 tablet  5  . VOLTAREN 1 % GEL Apply 2 g topically once a week.        No current facility-administered medications on file prior to visit.    BP 132/70  Pulse 84  Temp(Src) 99 F (37.2 C) (Oral)  Wt 191 lb (86.637 kg)  BMI 30.84 kg/m2       Objective:   Physical Exam  Constitutional: He is oriented to person, place, and time. He appears well-developed and well-nourished.  HENT:  Head: Normocephalic and atraumatic.  Right Ear: External ear normal.  Left Ear: External ear normal.  Mouth/Throat: Oropharynx is clear and moist.  Neck: Neck supple.  Cardiovascular: Normal rate, regular rhythm and normal heart sounds.   Pulmonary/Chest: Effort normal.  Coarse breath sounds bilaterally with faint expiratory wheeze  Abdominal: Soft. Bowel sounds are normal. There is no tenderness.  Lymphadenopathy:    He has no cervical adenopathy.  Neurological: He is alert and oriented to person, place, and time. No cranial nerve deficit.  Skin: Skin is warm and dry.  Psychiatric: He has a normal mood and affect. His behavior is normal.          Assessment & Plan:

## 2013-09-10 ENCOUNTER — Telehealth: Payer: Self-pay | Admitting: Internal Medicine

## 2013-09-10 ENCOUNTER — Other Ambulatory Visit: Payer: Self-pay | Admitting: Internal Medicine

## 2013-09-10 NOTE — Telephone Encounter (Signed)
Pt's wife is aware that we don't have any samples at this time.

## 2013-09-16 ENCOUNTER — Encounter: Payer: Self-pay | Admitting: Internal Medicine

## 2013-09-16 ENCOUNTER — Ambulatory Visit (INDEPENDENT_AMBULATORY_CARE_PROVIDER_SITE_OTHER)
Admission: RE | Admit: 2013-09-16 | Discharge: 2013-09-16 | Disposition: A | Discharge status: Home / Self Care | Payer: Medicare PPO | Source: Ambulatory Visit | Attending: Internal Medicine | Admitting: Internal Medicine

## 2013-09-16 ENCOUNTER — Ambulatory Visit (INDEPENDENT_AMBULATORY_CARE_PROVIDER_SITE_OTHER): Payer: Medicare PPO | Admitting: Internal Medicine

## 2013-09-16 VITALS — BP 122/80 | HR 70 | Ht 66.0 in | Wt 189.0 lb

## 2013-09-16 DIAGNOSIS — J00 Acute nasopharyngitis [common cold]: Secondary | ICD-10-CM

## 2013-09-16 DIAGNOSIS — J449 Chronic obstructive pulmonary disease, unspecified: Secondary | ICD-10-CM

## 2013-09-16 DIAGNOSIS — J4489 Other specified chronic obstructive pulmonary disease: Secondary | ICD-10-CM

## 2013-09-16 DIAGNOSIS — G4733 Obstructive sleep apnea (adult) (pediatric): Secondary | ICD-10-CM

## 2013-09-16 DIAGNOSIS — F17200 Nicotine dependence, unspecified, uncomplicated: Secondary | ICD-10-CM

## 2013-09-16 MED ORDER — PHENYLEPHRINE HCL 1 % NA SOLN
3.0000 [drp] | Freq: Once | NASAL | Status: AC
Start: 2013-09-16 — End: 2013-09-16
  Administered 2013-09-16: 3 [drp] via NASAL

## 2013-09-16 MED ORDER — METHYLPREDNISOLONE ACETATE 80 MG/ML IJ SUSP
80.0000 mg | Freq: Once | INTRAMUSCULAR | Status: AC
  Administered 2013-09-16: 80 mg via INTRAMUSCULAR

## 2013-09-16 NOTE — Progress Notes (Signed)
Patient ID: Ernest [name], male    DOB: 03-Mar-1956, 57 y.o.   MRN: [id number]  HPI 07/30/11- 40 yoM smoker followed for COPD/ bronchitis, OSA/ CPAP Last here 04/01/11- note reviewed. He had felt better on a trial of maintenance prednisone. He has felt more short of breath through the hot summer, with no acute event and without blood, chest pain or fever. Easy DOE around home. Has been sharing nebulizer with his wife, who now has a cold and we discussed this. Has been worse in last month, and expecially in recent rainy weather. Noting a little ankle swelling. Smothered trying to take a shower.  Only tolerating his CPAP about 3 nights/ week because he feels smothered, waking sore in anterior chest in the mornings.  Now down to only 2-3 cigarettes/ day and trying to stop.  CXR 08/14/10- Nl NAD PFT 05/26/08- FEV1/FVC 2.44/ 0.76, incr 19% w/ BD, R 0.59 ?DLCO 0.69  09/10/11- 55 yoM smoker followed for COPD/ bronchitis, OSA/ CPAP He reports feeling better "finally doing okay". He is down to just a couple of cigarettes a day. He is using CPAP regularly now and is comfortable with it. Alpha I antitrypsin Report came back normal "M. M." on 08/09/2011. Chest x-ray from 08/15/2011 showed stable changes of COPD. He has had flu vaccine.  11/12/11- 46 yoM smoker followed for COPD/ bronchitis, OSA/ CPAP Here with wife. Has had flu shot. He says he was doing well until 2 weeks ago. He again is coughing thick mucus which is difficult to clear. He depends on his metered inhaler and nebulizer to help clear his airways. Started Mucinex 3 days ago. Mucus is white. He denies fever, sore throat, chest pain, GI upset. Because of orthopnea he is sitting up to sleep for the past 2 nights.  01/14/12- 55 yoM smoker followed for COPD/ bronchitis, OSA/ CPAP Increase congested cough green to yellow sputum but he does not feel sick and denies fever. Sister who was a smoker recently died of lung cancer. Despite that and all of our  discussions, he still smokes a few cigarettes daily. We talked again about motivation to stop completely. Continues CPAP and Provigil one or 2 daily.  03/10/12  56 yoM smoker followed for COPD/ bronchitis, OSA/ CPAP Persistent bronchitic cough. Sputum is not purulent. He is "working on" his smoking habit but blames pollen for keeping him S. coughing now. Remains fully compliant with CPAP, all night every night. Still needs Provigil to help with residual daytime sleepiness as before.  05/12/12- 56 yoM smoker followed for COPD/ bronchitis, OSA/ CPAP Stays SOB all the time-even at rest; wheezing, coughing-productive-yellow in color, chest congestion, nasal congestion,. He is more interested today in smoking cessation which we discussed. He had quit for Daliresp after 10 days because of sustained nausea. He is taking all of his regular medicines and using his rescue inhaler several times a day. Off prednisone now for 1-2 months. CXR 01/23/12-  IMPRESSION:  No acute cardiopulmonary abnormality.  Original Report Authenticated By: Randall An, M.D.   07/13/12- 45 yoM smoker followed for COPD/ bronchitis, OSA/ CPAP, complicated by CAD Pt states increased sob,wheezing,productive cough x3 months. Still smoking with no serious effort to stop. His sister has lung cancer which has gotten his attention and he may be willing to try harder. Some days his breathing is better than others with no real trend. Coughing productive of white sputum with no blood. He has all of his medications currently.  07/30/12- 10 yoM  smoker followed for COPD/ bronchitis, OSA/ CPAP, complicated by CAD Cough-productive-yellow and thick; SOB, wheezing, chest congestion, head congestion x 1 week; O2 sat of 83% RA entered room-20 seconds RA sat of 90% CT 07/21/12-  IMPRESSION:  1. Tiny bilateral lung nodules which are similar to on the prior  exam, given differences in slice selection and measurement error.  Likely subpleural lymph  nodes.  2. Age advanced coronary artery atherosclerosis. Recommend  assessment of coronary risk factors and consideration of medical  therapy.  3. Slight enlargement of splenic lesion since 2007. Favored to  represent a hemangioma or lymphangioma.  Original Report Authenticated By: Consuello Bossier, M.D.   08/07/12- 63 yoM smoker followed for COPD/ bronchitis, OSA/ CPAP, complicated by CAD, disability for back pain. Cough-productive-white mostly; SOB , wheezing-worse at night     wife here Coughing spasms he can feel his upper airway is closing off. He he still smokes a little even when he feels badly and we went over this again today offering support.  11/13/12- 56 yoM smoker followed for COPD/ bronchitis, OSA/ CPAP, complicated by CAD, disability for back pain. FOLLOWS ZOX:WRUEA still but not productive-more SOB  Went to ER in September or acute exacerbation of COPD but not admitted. Today he is a scheduled visit. He actually says he is doing pretty well with no acute problems. Perennial nasal congestion. He has reduced his cigarette smoking to about 2 per day and is strongly encouraged to stop completely now.  01/25/13-  57 yoM smoker followed for COPD/ bronchitis, OSA/ CPAP, complicated by CAD, disability for back pain. ACUTE VISIT: started smoking agian-discuss taking Chantix; feels raw in chest area with deep cough x 2 weeks. He feels well at this visit, meaning he is at his baseline some daily nonproductive cough and shortness of breath with exertion.  05/17/13-57 yoM smoker followed for COPD/ bronchitis, OSA/ CPAP, complicated by CAD, disability for back pain. FOLLOWS VWU:[id number] is about the same as last visit; has lessened the about the amount he is smoking. Chantix helps. Continues CPAP/ 10/[employer] Discussed smoking cessation effort.  06/10/13- 57 yoM smoker followed for COPD/ bronchitis, OSA/ CPAP, complicated by CAD, disability for back pain. ACUTE VISIT: prod cough with yellow/green  mucus, increased SOB, wheezing, tightness in chest, chills x1 week.  still on augmentin and pred taper from 7/15 phone note > no better Trying Chantix. Wife is also sick. There exposed to his brother's child who has frequent colds. Just started prednisone taper and Augmentin yesterday. Had teeth pulled.  09/16/13- 57 yoM smoker followed for COPD/ bronchitis, OSA/ CPAP, complicated by CAD, disability for back pain. FOLLOWS FOR: was given the flu shot about 1 month and has not felt good since; continues to keep a headache, chest congestion at night. Describes frontal headache, head and chest congestion since a flu shot. Using Chantix, has reduced smoking to 2 or 3 cigarettes daily. Denies infection-no fever, sputum not purulent or bloody. No chest pain. Stays on Mucinex. Not using CPAP because of head congestion. Sister has died of lung cancer/smoker.  Review of Systems- see HPI Constitutional:   No-   weight loss, night sweats, fevers, chills,+ fatigue, lassitude. HEENT:  + headaches, difficulty swallowing, tooth/dental problems, +sore throat,       No-  sneezing, itching, ear ache, +nasal congestion, post nasal drip,  CV:   No chest pain, orthopnea, PND,  Little swelling in lower extremities,  dizziness, palpitations Resp: +  shortness of breath with exertion or at  rest.             + productive cough,  + non-productive cough,  No-  coughing up of blood.              No-   change in color of mucus.  +some wheezing.   Skin: No-   rash or lesions. GI:  No-   heartburn, indigestion, abdominal pain, nausea, vomiting, GU:  MS:  No-   joint pain or swelling.   Neuro- nothing unusual  Psych:  No- change in mood or affect. No depression or anxiety.  No memory loss.    Objective:   Physical Exam General- Alert, Oriented, Affect-appropriate, Distress- none acute   Stocky/ overweight, full beard Skin- rash-none, lesions- none, excoriation- none. Tanned     Lymphadenopathy- none Head- atraumatic             Eyes- Gross vision intact, PERRLA, conjunctivae clear secretions. + peri-orbital edema            Ears- Hearing, canals- normal            Nose- +turbinate edema, No- Septal dev, +mucus bridging, no-polyps, erosion, perforation             Throat- Mallampati III , mucosa red , drainage- none, tonsils- atrophic, hoarse Neck- flexible , trachea midline, no stridor , thyroid nl, carotid no bruit Chest - symmetrical excursion , unlabored           Heart/CV- RRR , no murmur , no gallop  , no rub, nl s1 s2                           - JVD- none , edema-none, stasis changes- none, varices- none           Lung-+ distant /Coarse, unlabored,  + wheeze, no- cough , dullness-none, rub- none           Chest wall-  Abd-  Br/ Gen/ Rectal- Not done, not indicated Extrem- cyanosis- none, clubbing, none, atrophy- none, strength- nl. 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 Neuro- grossly intact to observation

## 2013-09-16 NOTE — Patient Instructions (Signed)
Order- CXR dx COPD with exacerbation  Depo 80  Neb nasal neo  Get back on your CPAP as soon as you can. The sleep apnea may be contributing to why you don't feel well now.  You are making progress on the smoking and that is great. We will give you the information on the [facility] smoking cessation program again.

## 2013-09-17 ENCOUNTER — Telehealth: Payer: Self-pay | Admitting: Internal Medicine

## 2013-09-17 ENCOUNTER — Other Ambulatory Visit: Payer: Self-pay | Admitting: Internal Medicine

## 2013-09-17 DIAGNOSIS — R918 Other nonspecific abnormal finding of lung field: Secondary | ICD-10-CM

## 2013-09-17 NOTE — Telephone Encounter (Signed)
Per CDY I have put in orders for BMET and CT w/o contrast Dx: lung mass.  Pt aware will be contacted to schedule

## 2013-09-17 NOTE — Telephone Encounter (Signed)
I called him to report CXR shows LUL mass concerning for possible lung cancer. He agrees to schedule CT   Please order CT chest with contrast and lab BMET, for dx lung mass

## 2013-09-22 ENCOUNTER — Ambulatory Visit (INDEPENDENT_AMBULATORY_CARE_PROVIDER_SITE_OTHER)
Admission: RE | Admit: 2013-09-22 | Discharge: 2013-09-22 | Disposition: A | Discharge status: Home / Self Care | Payer: Medicare PPO | Source: Ambulatory Visit | Attending: Internal Medicine | Admitting: Internal Medicine

## 2013-09-22 DIAGNOSIS — R918 Other nonspecific abnormal finding of lung field: Secondary | ICD-10-CM

## 2013-09-22 DIAGNOSIS — R222 Localized swelling, mass and lump, trunk: Secondary | ICD-10-CM

## 2013-09-22 MED ORDER — IOHEXOL 300 MG/ML  SOLN
80.0000 mL | Freq: Once | INTRAMUSCULAR | Status: AC | PRN
  Administered 2013-09-22: 80 mL via INTRAVENOUS

## 2013-09-23 ENCOUNTER — Other Ambulatory Visit: Payer: Self-pay | Admitting: Internal Medicine

## 2013-09-23 ENCOUNTER — Telehealth: Payer: Self-pay | Admitting: Internal Medicine

## 2013-09-23 NOTE — Telephone Encounter (Signed)
I reviewed Chest CT and presented for discussion today at Thoracic Oncology meeting, including discussion of biopsy approach w/ Dr Delton Coombes. I discussed this w/ Ernest [name] this morning and Dr Delton Coombes is going to work with him. We also need to update at least office spirometry.  Order- ASAP appointment with Dr Delton Coombes for dx lung mass.              Office spirometry

## 2013-09-24 NOTE — Telephone Encounter (Signed)
Spoke with [facility]-Pt can come in on November 3,2014 at 3pm. I have called Rose at CT and asked that she put Ct Chest into Super D to send to RB asap. Pt is aware of appointment date and time.

## 2013-09-27 ENCOUNTER — Encounter: Payer: Self-pay | Admitting: Emergency Medicine

## 2013-09-27 ENCOUNTER — Ambulatory Visit (INDEPENDENT_AMBULATORY_CARE_PROVIDER_SITE_OTHER): Payer: Medicare PPO | Admitting: Emergency Medicine

## 2013-09-27 VITALS — BP 120/72 | HR 71 | Ht 67.0 in | Wt 184.6 lb

## 2013-09-27 DIAGNOSIS — J4489 Other specified chronic obstructive pulmonary disease: Secondary | ICD-10-CM

## 2013-09-27 DIAGNOSIS — J449 Chronic obstructive pulmonary disease, unspecified: Secondary | ICD-10-CM

## 2013-09-27 DIAGNOSIS — R911 Solitary pulmonary nodule: Secondary | ICD-10-CM

## 2013-09-27 NOTE — Progress Notes (Signed)
HPI:  57 yo man, followed by Dr Maple Hudson for COPD, chronic bronchitis, OSA on CPAP, CAD, tobacco use, 1/2 pk a day. He had CXR and then CT scan 10/'14 that unfortunately showed LUL mass. He is referred for evaluation for biopsy of the LUL lesion. He is on Arcapta, Advair. He has baseline exertional SOB. No CP, no palpitations. BP is stable.    Past Medical History  Diagnosis Date  . CAD (coronary artery disease)     Left Main 30% stenosis, LAD 20 - 30 % stenosis, first and second diagonal branchesat 40 - 50%  stenosis with small arteries, circumflex had 30% stenosis in the large obtuse marginal, RCA at 70 - 80%  stenosis [not felt to be occlusive after evaluation with flow wire], distal 50 - 60% stenosis - [name][  . COPD (chronic obstructive pulmonary disease)     Dr. Jetty Duhamel  . Depression   . Anxiety   . Hyperlipidemia   . Chronic insomnia   . OSA (obstructive sleep apnea)     NPSG 09/10/10- AHI 11.3/hr  . Gout   . GERD (gastroesophageal reflux disease)   . PVD (peripheral vascular disease)     (ABI 0.9 on right and 0.89 on left)  severe left external iliac stenosis.  He  had successful stenting of his left external iliac per Dr. Excell Seltzer.  . DDD (degenerative disc disease)   . Hx of colonoscopy   . Hypertension   . COPD with asthma 09/08/2007     Family History  Problem Relation Age of Onset  . Heart attack Mother   . Heart attack Sister   . Emphysema Sister      History   Social History  . Marital Status: Married    Spouse Name: N/A    Number of Children: N/A  . Years of Education: N/A   Occupational History  . Disabled welder    Social History Main Topics  . Smoking status: Current Some Day Smoker -- 0.20 packs/day for 43 years    Types: Cigarettes    Last Attempt to Quit: 06/24/2013  . Smokeless tobacco: Never Used     Comment: On Chantix. smokes 2- 3 cigarettes daily  . Alcohol Use: No  . Drug Use: No  . Sexual Activity: No   Other Topics Concern   . Not on file   Social History Narrative  . No narrative on file     No Known Allergies   Outpatient Prescriptions Prior to Visit  Medication Sig Dispense Refill  . albuterol (PROVENTIL) (2.5 MG/3ML) 0.083% nebulizer solution USE 1 VIAL IN NEBULIZER EVERY 4 HOURS AS NEEDED FOR WHEEZING OR SHORTNESS OF BREATH  75 mL  prn  . amLODipine (NORVASC) 10 MG tablet TAKE 1 TABLET (10 MG TOTAL) BY MOUTH DAILY.  30 tablet  6  . ARCAPTA NEOHALER 75 MCG CAPS PLACE 1 CAPSULE INTO INHALER AND INHALE DAILY.  30 capsule  5  . ARIPiprazole (ABILIFY) 5 MG tablet Take 1 tablet (5 mg total) by mouth at bedtime.      [name] atorvastatin (LIPITOR) 20 MG tablet Take 1 tablet (20 mg total) by mouth daily.  90 tablet  3  . carvedilol (COREG) 25 MG tablet Take 1 tablet (25 mg total) by mouth 2 (two) times daily with a meal.  180 tablet  3  . clonazePAM (KLONOPIN) 1 MG tablet Take 1 mg by mouth at bedtime as needed. For anxiety/insomnia      . doxycycline (  VIBRA-TABS) 100 MG tablet Take 1 tablet (100 mg total) by mouth 2 (two) times daily.  20 tablet  0  . esomeprazole (NEXIUM) 40 MG capsule Take 1 capsule (40 mg total) by mouth daily before breakfast.  90 capsule  3  . FLUoxetine (PROZAC) 40 MG capsule Take 40 mg by mouth daily.        . Fluticasone-Salmeterol (ADVAIR DISKUS) 250-50 MCG/DOSE AEPB Inhale 1 puff into the lungs 2 (two) times daily.  1 each  3  . gabapentin (NEURONTIN) 100 MG capsule Take 1 capsule by mouth daily.      [name] losartan (COZAAR) 25 MG tablet Take 1 tablet (25 mg total) by mouth daily.  90 tablet  3  . mirtazapine (REMERON) 15 MG tablet Take 1.5 tablets (22.5 mg total) by mouth at bedtime.  90 tablet  1  . modafinil (PROVIGIL) 200 MG tablet Take 200 mg by mouth daily.        [name] morphine (MS CONTIN) 60 MG 12 hr tablet Take 60 mg by mouth 2 (two) times daily.        . Omega-3 Fatty Acids (FISH OIL CONCENTRATE) 1000 MG CAPS Take 1,000 mg by mouth daily.       . potassium chloride SA (K-DUR,KLOR-CON) 20  MEQ tablet Take 20 mEq by mouth daily.      [name] PROVENTIL HFA 108 (90 BASE) MCG/ACT inhaler INHALE 2 PUFFS INTO THE LUNGS 4 (FOUR) TIMES DAILY AS NEEDED FOR WHEEZING.  6.7 each  10  . varenicline (CHANTIX CONTINUING MONTH PAK) 1 MG tablet Take 1 tablet (1 mg total) by mouth 2 (two) times daily.  60 tablet  5  . VOLTAREN 1 % GEL Apply 2 g topically once a week.        No facility-administered medications prior to visit.    Filed Vitals:   09/27/13 1504  BP: 120/72  Pulse: 71  Height: 5\' 7"  (1.702 m)  Weight: 184 lb 9.6 oz (83.734 kg)  SpO2: 95%   Gen: Pleasant, well-nourished, in no distress,  normal affect  ENT: No lesions,  mouth clear,  oropharynx clear, no postnasal drip  Neck: No JVD, no TMG, no carotid bruits  Lungs: No use of accessory muscles, coarse on the R, few end exp wheezes best heard L upper field   Cardiovascular: RRR, heart sounds normal, no murmur or gallops, no peripheral edema  Musculoskeletal: No deformities, no cyanosis or clubbing  Neuro: alert, non focal  Skin: Warm, no lesions or rashes    09/23/13 --  COMPARISON: Chest CT 07/16/2012.  FINDINGS:  Mediastinum: Enlarged left hilar lymph node or conglomerate nodal  mass measuring up to 1.8 x 3.1 cm. Enlarged AP window lymph node  measuring 1.3 cm in short axis. No contralateral hilar or definite  paratracheal or subcarinal lymphadenopathy identified at this time.  Heart size is normal. There is no significant pericardial fluid,  thickening or pericardial calcification. There is atherosclerosis of  the thoracic aorta, the great vessels of the mediastinum and the  coronary arteries, including calcified atherosclerotic plaque in the  left anterior descending, left circumflex and right coronary  arteries. Esophagus is unremarkable in appearance. While of the left  upper lobe mass abuts the mediastinum medially (discussed the low)  coming in close proximity to the distal aspect of the aortic arch,  there  does appear to be in a intervening fat plane at this time  suggesting against direct mediastinal invasion.  Lungs/Pleura: Large left upper  lobe mass measuring 5.7 x 4.7 x 5.0  cm in the medial aspect of the left upper lobe (image 16 of series  3) with surrounding areas of ground-glass attenuation and  architectural distortion extending into the apex of the left upper  lobe, most compatible with postobstructive pneumonitis. This mass  makes contact with the mediastinal pleura over approximtealy 1/4 of  its circumference. There are few scattered tiny 2-4 mm nodules  throughout the left upper lobe which may simply represent part of  the postobstructive changes, or may represent tiny satellite tumor  nodules. No other definite suspicious pulmonary nodules are noted at  this time. Very mild centrilobular and paraseptal emphysema. No  acute consolidative stretched at no pleural effusions.  Upper Abdomen: Incompletely visualized low-attenuation lesion in the  posterior aspect of the spleen which measures at least 5.0 x 3.4 cm,  similar in retrospect compared to prior studies, favored to  represent a benign lesion such as a splenic cyst.  Musculoskeletal: There are no aggressive appearing lytic or blastic  lesions noted in the visualized portions of the skeleton.  IMPRESSION:  1. 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 This is associated with  some postobstructive changes in the left upper lobe as well as left  hilar and AP window lymphadenopathy. Based on these findings, and  assuming lack of distant metastatic disease (which will have to be  proven by other imaging studies), this is favored to represent at  least T2b (if not T3 because of satellite nodules in the left upper  lobe), N2, Mx disease (i.e., at least stage IIIA disease). Further  evaluation with PET-CT and brain MRI with and without IV gadolinium  is suggested at this  time for complete staging.  2. Mild centrilobular and paraseptal emphysema.  3. Atherosclerosis, including 3 vessel coronary artery disease.  Assessment for potential risk factor modification, dietary therapy  or pharmacologic therapy may be warranted, if clinically indicated.  4. Additional incidental findings, as above.   Lung nodule, hx of LUL nodule/ mass on Ct scan 09/23/13, suspicious for malignancy. I believe there is a chance to get a good bx through conventional FOB, but highest yield strategy would be FOB + ENB. Will attempt to set up in the OR, start with FOB under conscious sedation but progress to intubation and ENB if the frozen bx's are non-diagnostic.

## 2013-09-27 NOTE — Assessment & Plan Note (Signed)
LUL nodule/ mass on Ct scan 09/23/13, suspicious for malignancy. I believe there is a chance to get a good bx through conventional FOB, but highest yield strategy would be FOB + ENB. Will attempt to set up in the OR, start with FOB under conscious sedation but progress to intubation and ENB if the frozen bx's are non-diagnostic.

## 2013-09-27 NOTE — Patient Instructions (Addendum)
We will perform spirometry today to compare with 2009  ECG today. Will discuss your status with Dr Antoine Poche before proceeding with a procedure.  We will schedule a bronchoscopy and probable navigational biopsies in the near future to better assess your left lung mass.  Please continue your Arcapta and Advair as you are taking them.  Please work on stopping smoking Follow with Dr Delton Coombes in 1 month

## 2013-09-28 ENCOUNTER — Encounter (HOSPITAL_COMMUNITY): Payer: Self-pay | Admitting: Pharmacy Technician

## 2013-10-01 ENCOUNTER — Encounter (HOSPITAL_COMMUNITY)
Admission: RE | Admit: 2013-10-01 | Discharge: 2013-10-01 | Disposition: A | Discharge status: Home / Self Care | Payer: Medicare PPO | Source: Ambulatory Visit | Attending: Emergency Medicine | Admitting: Emergency Medicine

## 2013-10-01 ENCOUNTER — Encounter (HOSPITAL_COMMUNITY): Payer: Self-pay

## 2013-10-01 LAB — COMPREHENSIVE METABOLIC PANEL
ALT: 19 U/L (ref 0–53)
AST: 18 U/L (ref 0–37)
Albumin: 3.6 g/dL (ref 3.5–5.2)
BUN: 10 mg/dL (ref 6–23)
CO2: 25 mEq/L (ref 19–32)
Calcium: 9.3 mg/dL (ref 8.4–10.5)
Creatinine, Ser: 0.74 mg/dL (ref 0.50–1.35)
GFR calc non Af Amer: >90 mL/min (ref >90)
Sodium: 136 mEq/L (ref 135–145)
Total Bilirubin: 0.3 mg/dL (ref 0.3–1.2)
Total Protein: 7 g/dL (ref 6.0–8.3)

## 2013-10-01 LAB — CBC
MCH: 29.2 pg (ref 26.0–34.0)
MCV: 87.8 fL (ref 78.0–100.0)
Platelets: 184 10*3/uL (ref 150–400)
RBC: 5.18 MIL/uL (ref 4.22–5.81)
RDW: 14.1 % (ref 11.5–15.5)
WBC: 12.4 10*3/uL — ABNORMAL HIGH (ref 4.0–10.5)

## 2013-10-01 LAB — PROTIME-INR: Prothrombin Time: 13 seconds (ref 11.6–15.2)

## 2013-10-01 NOTE — Pre-Procedure Instructions (Signed)
Yutaka E Web  10/01/2013   Your procedure is scheduled on:  10/04/13  Report to [facility]  2 * 3 at 730 AM.  Call this number if you have problems the morning of surgery: [phone]   Remember:   Do not eat food or drink liquids after midnight.   Take these medicines the morning of surgery with A SIP OF WATER: all inhalers,amlodipine,carvedilol,nexium,prozac,neurontin,pain medication,chantix   Do not wear jewelry, make-up or nail polish.  Do not wear lotions, powders, or perfumes. You may wear deodorant.  Do not shave 48 hours prior to surgery. Men may shave face and neck.  Do not bring valuables to the hospital.  [facility] is not responsible                  for any belongings or valuables.               Contacts, dentures or bridgework may not be worn into surgery.  Leave suitcase in the car. After surgery it may be brought to your room.  For patients admitted to the hospital, discharge time is determined by your                treatment team.               Patients discharged the day of surgery will not be allowed to drive  home.  Name and phone number of your driver: family  Special Instructions: Incentive Spirometry - Practice and bring it with you on the day of surgery.   Please read over the following fact sheets that you were given: Pain Booklet, Coughing and Deep Breathing and Surgical Site Infection Prevention

## 2013-10-03 ENCOUNTER — Emergency Department (HOSPITAL_COMMUNITY): Payer: Medicare PPO

## 2013-10-03 ENCOUNTER — Encounter (HOSPITAL_COMMUNITY): Payer: Self-pay | Admitting: Emergency Medicine

## 2013-10-03 ENCOUNTER — Emergency Department (HOSPITAL_COMMUNITY)
Admission: EM | Admit: 2013-10-03 | Discharge: 2013-10-04 | Disposition: A | Discharge status: Home / Self Care | Payer: Medicare PPO | Attending: Emergency Medicine | Admitting: Emergency Medicine

## 2013-10-03 ENCOUNTER — Other Ambulatory Visit: Payer: Self-pay

## 2013-10-03 ENCOUNTER — Encounter (HOSPITAL_COMMUNITY): Payer: Self-pay | Admitting: Certified Registered Nurse Anesthetist

## 2013-10-03 DIAGNOSIS — Z85118 Personal history of other malignant neoplasm of bronchus and lung: Secondary | ICD-10-CM | POA: Insufficient documentation

## 2013-10-03 DIAGNOSIS — I2510 Atherosclerotic heart disease of native coronary artery without angina pectoris: Secondary | ICD-10-CM | POA: Insufficient documentation

## 2013-10-03 DIAGNOSIS — F411 Generalized anxiety disorder: Secondary | ICD-10-CM | POA: Insufficient documentation

## 2013-10-03 DIAGNOSIS — R0602 Shortness of breath: Secondary | ICD-10-CM

## 2013-10-03 DIAGNOSIS — J00 Acute nasopharyngitis [common cold]: Secondary | ICD-10-CM | POA: Insufficient documentation

## 2013-10-03 DIAGNOSIS — F329 Major depressive disorder, single episode, unspecified: Secondary | ICD-10-CM | POA: Insufficient documentation

## 2013-10-03 DIAGNOSIS — Z862 Personal history of diseases of the blood and blood-forming organs and certain disorders involving the immune mechanism: Secondary | ICD-10-CM | POA: Insufficient documentation

## 2013-10-03 DIAGNOSIS — F17200 Nicotine dependence, unspecified, uncomplicated: Secondary | ICD-10-CM | POA: Insufficient documentation

## 2013-10-03 DIAGNOSIS — Z8639 Personal history of other endocrine, nutritional and metabolic disease: Secondary | ICD-10-CM | POA: Insufficient documentation

## 2013-10-03 DIAGNOSIS — Z8739 Personal history of other diseases of the musculoskeletal system and connective tissue: Secondary | ICD-10-CM | POA: Insufficient documentation

## 2013-10-03 DIAGNOSIS — J449 Chronic obstructive pulmonary disease, unspecified: Secondary | ICD-10-CM

## 2013-10-03 DIAGNOSIS — IMO0002 Reserved for concepts with insufficient information to code with codable children: Secondary | ICD-10-CM | POA: Insufficient documentation

## 2013-10-03 DIAGNOSIS — J441 Chronic obstructive pulmonary disease with (acute) exacerbation: Secondary | ICD-10-CM | POA: Insufficient documentation

## 2013-10-03 DIAGNOSIS — Z79899 Other long term (current) drug therapy: Secondary | ICD-10-CM | POA: Insufficient documentation

## 2013-10-03 DIAGNOSIS — I10 Essential (primary) hypertension: Secondary | ICD-10-CM | POA: Insufficient documentation

## 2013-10-03 DIAGNOSIS — F3289 Other specified depressive episodes: Secondary | ICD-10-CM | POA: Insufficient documentation

## 2013-10-03 DIAGNOSIS — K219 Gastro-esophageal reflux disease without esophagitis: Secondary | ICD-10-CM | POA: Insufficient documentation

## 2013-10-03 LAB — BASIC METABOLIC PANEL WITH GFR
BUN: 14 mg/dL (ref 6–23)
CO2: 27 meq/L (ref 19–32)
Calcium: 9.2 mg/dL (ref 8.4–10.5)
Chloride: 96 meq/L (ref 96–112)
Creatinine, Ser: 0.77 mg/dL (ref 0.50–1.35)
GFR calc Af Amer: >90 mL/min (ref >90)
GFR calc non Af Amer: >90 mL/min (ref >90)
Glucose, Bld: 107 mg/dL — ABNORMAL HIGH (ref 70–99)
Potassium: 3.8 meq/L (ref 3.5–5.1)
Sodium: 135 meq/L (ref 135–145)

## 2013-10-03 LAB — CBC
HCT: 45.6 % (ref 39.0–52.0)
Hemoglobin: 15.7 g/dL (ref 13.0–17.0)
MCHC: 34.4 g/dL (ref 30.0–36.0)
Platelets: 215 10*3/uL (ref 150–400)
RBC: 5.27 MIL/uL (ref 4.22–5.81)

## 2013-10-03 LAB — POCT I-STAT TROPONIN I: Troponin i, poc: 0 ng/mL (ref 0.00–0.08)

## 2013-10-03 LAB — PRO B NATRIURETIC PEPTIDE: Pro B Natriuretic peptide (BNP): 64.7 pg/mL (ref 0–125)

## 2013-10-03 MED ORDER — IPRATROPIUM BROMIDE 0.02 % IN SOLN
0.5000 mg | Freq: Once | RESPIRATORY_TRACT | Status: AC
  Administered 2013-10-03: 0.5 mg via RESPIRATORY_TRACT
  Filled 2013-10-03: qty 2.5

## 2013-10-03 MED ORDER — ALBUTEROL SULFATE (5 MG/ML) 0.5% IN NEBU
5.0000 mg | INHALATION_SOLUTION | Freq: Once | RESPIRATORY_TRACT | Status: AC
  Administered 2013-10-04: 5 mg via RESPIRATORY_TRACT
  Filled 2013-10-03: qty 1

## 2013-10-03 MED ORDER — IPRATROPIUM BROMIDE 0.02 % IN SOLN
0.5000 mg | Freq: Once | RESPIRATORY_TRACT | Status: AC
  Administered 2013-10-04: 0.5 mg via RESPIRATORY_TRACT
  Filled 2013-10-03: qty 2.5

## 2013-10-03 MED ORDER — ALBUTEROL SULFATE (5 MG/ML) 0.5% IN NEBU
5.0000 mg | INHALATION_SOLUTION | Freq: Once | RESPIRATORY_TRACT | Status: AC
  Administered 2013-10-03: 5 mg via RESPIRATORY_TRACT
  Filled 2013-10-03: qty 1

## 2013-10-03 NOTE — Assessment & Plan Note (Signed)
He seems to be describing an exacerbation attributed to his flu shot but without obvious infection. Plan-chest x-ray. Reinforced smoking cessation.

## 2013-10-03 NOTE — ED Notes (Signed)
Pt. reports progressing SOB with occasional productive cough and chest tightness for several weeks got worse today , pt. stated that he is scheduled for left lung biopsy tomorrow .

## 2013-10-03 NOTE — Assessment & Plan Note (Signed)
Recently unable to use CPAP because of uncomfortable head congestion, but usually very compliant and pressure has been appropriate.

## 2013-10-03 NOTE — Assessment & Plan Note (Signed)
He is more motivated now to try to quit. Unfortunately his sister has died of lung cancer

## 2013-10-03 NOTE — ED Provider Notes (Signed)
CSN: [id number]     Arrival date & time 10/03/13  2116 History   First MD Initiated Contact with Patient 10/03/13 2152     Chief Complaint  Patient presents with  . Shortness of Breath   (Consider location/radiation/quality/duration/timing/severity/associated sxs/prior Treatment) HPI  Patient presents to the emergency department with complaints of shortness of breath and cough. He admits to having a cough for the past 2 weeks which is different from his normal COPD cough. The patient is supposed to have a lung biopsy done tomorrow morning by Dr. Solon Augusta to evaluate 5 cm mass in his left long which is suspicious for malignancy. He also has COPD and is a 21 pack per year smoker. He says that he normally has some difficulty breathing and he is on a large assortment of medications for this. 3-4 hours ago developed some shortness of breath and tried using his home nebulizer but was not having relief. Since arriving GERD his breathing has become 75% better .intervention from the hospital. He is currently on 2 L nasal cannula with a oxygen saturation of 93%. He denies having any fevers, nausea, vomiting, diarrhea, weakness, confusion.  Past Medical History  Diagnosis Date  . CAD (coronary artery disease)     Left Main 30% stenosis, LAD 20 - 30 % stenosis, first and second diagonal branchesat 40 - 50%  stenosis with small arteries, circumflex had 30% stenosis in the large obtuse marginal, RCA at 70 - 80%  stenosis [not felt to be occlusive after evaluation with flow wire], distal 50 - 60% stenosis - [name][  . COPD (chronic obstructive pulmonary disease)     Dr. Jetty Duhamel  . Depression   . Anxiety   . Hyperlipidemia   . Chronic insomnia   . Gout   . GERD (gastroesophageal reflux disease)   . PVD (peripheral vascular disease)     (ABI 0.9 on right and 0.89 on left)  severe left external iliac stenosis.  He  had successful stenting of his left external iliac per Dr. Excell Seltzer.  . DDD  (degenerative disc disease)   . Hx of colonoscopy   . 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 Cancer     lung  . OSA (obstructive sleep apnea)     NPSG 09/10/10- AHI 11.3/hr  . Hypertension     dr Antoine Poche   Past Surgical History  Procedure Laterality Date  . Spinal fusion  03/05/2007    L4-L5  . Hip surgery      left  . Arm surgery      left  . Shoulder surgery      right  . C-spine surgery    . Angioplasty     Family History  Problem Relation Age of Onset  . Heart attack Mother   . Heart attack Sister   . Emphysema Sister    History  Substance Use Topics  . Smoking status: Current Some Day Smoker -- 0.50 packs/day for 43 years    Types: Cigarettes    Last Attempt to Quit: 06/24/2013  . Smokeless tobacco: Never Used     Comment: On Chantix. smokes 2- 3 cigarettes daily  . Alcohol Use: No    Review of Systems The patient denies anorexia, fever, weight loss,, vision loss, decreased hearing, hoarseness, chest pain, syncope, dyspnea on exertion, peripheral edema, balance deficits, hemoptysis, abdominal pain, melena, hematochezia, severe indigestion/heartburn, hematuria, incontinence, genital sores, muscle weakness, suspicious skin lesions, transient blindness, difficulty walking, depression, unusual weight change, abnormal bleeding,  enlarged lymph nodes, angioedema, and breast masses.  Allergies  Review of patient's allergies indicates no known allergies.  Home Medications   Current Outpatient Rx  Name  Route  Sig  Dispense  Refill  . albuterol (PROVENTIL HFA;VENTOLIN HFA) 108 (90 BASE) MCG/ACT inhaler   Inhalation   Inhale 2 puffs into the lungs every 4 (four) hours as needed for wheezing or shortness of breath.         [name] albuterol (PROVENTIL) (2.5 MG/3ML) 0.083% nebulizer solution   Nebulization   Take 2.5 mg by nebulization every 4 (four) hours as needed for wheezing or shortness of breath.         [name] amLODipine (NORVASC) 10 MG tablet   Oral   Take 10 mg by mouth  daily.         . ARIPiprazole (ABILIFY) 5 MG tablet   Oral   Take 1 tablet (5 mg total) by mouth at bedtime.         [name] atorvastatin (LIPITOR) 20 MG tablet   Oral   Take 1 tablet (20 mg total) by mouth daily.   90 tablet   3   . carvedilol (COREG) 25 MG tablet   Oral   Take 1 tablet (25 mg total) by mouth 2 (two) times daily with a meal.   180 tablet   3   . clonazePAM (KLONOPIN) 1 MG tablet   Oral   Take 1 mg by mouth at bedtime as needed. For anxiety/insomnia         . esomeprazole (NEXIUM) 40 MG capsule   Oral   Take 1 capsule (40 mg total) by mouth daily before breakfast.   90 capsule   3   . FLUoxetine (PROZAC) 40 MG capsule   Oral   Take 40 mg by mouth daily.           . Fluticasone-Salmeterol (ADVAIR DISKUS) 250-50 MCG/DOSE AEPB   Inhalation   Inhale 1 puff into the lungs 2 (two) times daily.   1 each   3   . gabapentin (NEURONTIN) 100 MG capsule   Oral   Take 100 mg by mouth daily.          . Indacaterol Maleate (ARCAPTA NEOHALER) 75 MCG CAPS   Inhalation   Place 1 capsule into inhaler and inhale daily.         [name] losartan (COZAAR) 25 MG tablet   Oral   Take 1 tablet (25 mg total) by mouth daily.   90 tablet   3   . mirtazapine (REMERON) 15 MG tablet   Oral   Take 1.5 tablets (22.5 mg total) by mouth at bedtime.   90 tablet   1   . modafinil (PROVIGIL) 200 MG tablet   Oral   Take 200 mg by mouth daily.          [name] morphine (MS CONTIN) 60 MG 12 hr tablet   Oral   Take 60 mg by mouth 2 (two) times daily.           . Omega-3 Fatty Acids (FISH OIL CONCENTRATE) 1000 MG CAPS   Oral   Take 1,000 mg by mouth daily.          [name] oxyCODONE-acetaminophen (PERCOCET) 10-325 MG per tablet   Oral   Take 1 tablet by mouth 3 (three) times daily as needed. For pain         . potassium chloride SA (K-DUR,KLOR-CON) 20 MEQ tablet  Oral   Take 20 mEq by mouth daily.         . varenicline (CHANTIX CONTINUING MONTH PAK) 1 MG tablet    Oral   Take 1 tablet (1 mg total) by mouth 2 (two) times daily.   60 tablet   5   . VOLTAREN 1 % GEL   Topical   Apply 2 g topically daily as needed (for pain).           BP 125/66  Pulse 78  Temp(Src) 97.7 F (36.5 C) (Oral)  Resp 19  SpO2 95% Physical Exam  Nursing note and vitals reviewed. Constitutional: He appears well-developed and well-nourished. No distress.  HENT:  Head: Normocephalic and atraumatic.  Eyes: Pupils are equal, round, and reactive to light.  Neck: Normal range of motion. Neck supple.  Cardiovascular: Normal rate and regular rhythm.   Pulmonary/Chest: Effort normal. He has decreased breath sounds. He has wheezes.  Pt on 2L [name]   Abdominal: Soft.  Neurological: He is alert.  Skin: Skin is warm and dry.    ED Course  Procedures (including critical care time) Labs Review Labs Reviewed  CBC - Abnormal; Notable for the following:    WBC 14.7 (*)    All other components within normal limits  BASIC METABOLIC PANEL - Abnormal; Notable for the following:    Glucose, Bld 107 (*)    All other components within normal limits  PRO B NATRIURETIC PEPTIDE  POCT I-STAT TROPONIN I   Imaging Review Dg Chest 2 View  10/03/2013   CLINICAL DATA:  Shortness of breath and chest tightness. History of smoking.  EXAM: CHEST  2 VIEW  COMPARISON:  Chest radiograph performed 09/16/2013, and CT of the chest performed 09/22/2013  FINDINGS: The patient's large left upper lobe mass is grossly unchanged in appearance. The lungs are otherwise relatively clear. No focal consolidation, pleural effusion or pneumothorax is seen.  The heart remains normal in size. No acute osseus abnormalities are identified. Cervical spinal fusion hardware is noted.  IMPRESSION: Stable appearance to large left upper lobe lung mass. Lungs otherwise grossly clear.   Electronically Signed   By: Roanna Raider M.D.   On: 10/03/2013 23:16    EKG Interpretation   None       MDM   1. COPD (chronic  obstructive pulmonary disease)   2. Shortness of breath     10:45 PM-patient is currently in no respiratory distress. He is not moving air as well as he should. He is to go to x-ray to rule out bronchitis or a pneumonia and will be given another albuterol Atrovent treatment. Will reevaluate.  12:49 pm- pt feeling much better after the breathing treatments. He [facility] at 7:30am for his lung biopsy. He has COPD and chronic SOB with suspect malignancy to his lungs. I have advised him to make his providers aware of his visit this evening.   57 y.o.Susano E Huy's evaluation in the Emergency Department is complete. It has been determined that no acute conditions requiring further emergency intervention are present at this time. The patient/guardian have been advised of the diagnosis and plan. We have discussed signs and symptoms that warrant return to the ED, such as changes or worsening in symptoms. Pt and spouse would like to go home and are comfortable with this.  Vital signs are stable at discharge. Filed Vitals:   10/03/13 2213  BP: 125/66  Pulse:   Temp: 97.7 F (36.5  C)  Resp: 19    Patient/guardian has voiced understanding and agreed to follow-up with the PCP or specialist.   Dorthula Matas, PA-C 10/04/13 0050

## 2013-10-03 NOTE — Assessment & Plan Note (Signed)
Is describing head congestion with frontal headache but no purulent discharge. There is some periorbital edema. We will try a decongestant approach first, but he may need trial of antibiotic Plan- neb nasal decongestant, depomedrol

## 2013-10-04 ENCOUNTER — Ambulatory Visit (HOSPITAL_COMMUNITY): Payer: Medicare PPO

## 2013-10-04 ENCOUNTER — Encounter (HOSPITAL_COMMUNITY)
Admission: RE | Disposition: A | Discharge status: Home / Self Care | Payer: Self-pay | Source: Ambulatory Visit | Attending: Emergency Medicine

## 2013-10-04 ENCOUNTER — Encounter (HOSPITAL_COMMUNITY): Payer: Self-pay | Admitting: Certified Registered Nurse Anesthetist

## 2013-10-04 ENCOUNTER — Encounter (HOSPITAL_COMMUNITY): Payer: Medicare PPO | Admitting: Certified Registered Nurse Anesthetist

## 2013-10-04 ENCOUNTER — Ambulatory Visit (HOSPITAL_COMMUNITY)
Admission: RE | Admit: 2013-10-04 | Discharge: 2013-10-04 | Disposition: A | Discharge status: Home / Self Care | Payer: Medicare PPO | Source: Ambulatory Visit | Attending: Emergency Medicine | Admitting: Emergency Medicine

## 2013-10-04 ENCOUNTER — Ambulatory Visit (HOSPITAL_COMMUNITY): Payer: Medicare PPO | Admitting: Certified Registered Nurse Anesthetist

## 2013-10-04 DIAGNOSIS — F411 Generalized anxiety disorder: Secondary | ICD-10-CM | POA: Insufficient documentation

## 2013-10-04 DIAGNOSIS — I10 Essential (primary) hypertension: Secondary | ICD-10-CM | POA: Insufficient documentation

## 2013-10-04 DIAGNOSIS — I739 Peripheral vascular disease, unspecified: Secondary | ICD-10-CM | POA: Insufficient documentation

## 2013-10-04 DIAGNOSIS — C3490 Malignant neoplasm of unspecified part of unspecified bronchus or lung: Secondary | ICD-10-CM

## 2013-10-04 DIAGNOSIS — F329 Major depressive disorder, single episode, unspecified: Secondary | ICD-10-CM | POA: Insufficient documentation

## 2013-10-04 DIAGNOSIS — R911 Solitary pulmonary nodule: Secondary | ICD-10-CM

## 2013-10-04 DIAGNOSIS — J449 Chronic obstructive pulmonary disease, unspecified: Secondary | ICD-10-CM | POA: Insufficient documentation

## 2013-10-04 DIAGNOSIS — I2510 Atherosclerotic heart disease of native coronary artery without angina pectoris: Secondary | ICD-10-CM | POA: Insufficient documentation

## 2013-10-04 DIAGNOSIS — F17200 Nicotine dependence, unspecified, uncomplicated: Secondary | ICD-10-CM | POA: Insufficient documentation

## 2013-10-04 DIAGNOSIS — G4700 Insomnia, unspecified: Secondary | ICD-10-CM | POA: Insufficient documentation

## 2013-10-04 DIAGNOSIS — F3289 Other specified depressive episodes: Secondary | ICD-10-CM | POA: Insufficient documentation

## 2013-10-04 DIAGNOSIS — C3410 Malignant neoplasm of upper lobe, unspecified bronchus or lung: Secondary | ICD-10-CM | POA: Insufficient documentation

## 2013-10-04 DIAGNOSIS — E785 Hyperlipidemia, unspecified: Secondary | ICD-10-CM | POA: Insufficient documentation

## 2013-10-04 DIAGNOSIS — J4489 Other specified chronic obstructive pulmonary disease: Secondary | ICD-10-CM | POA: Insufficient documentation

## 2013-10-04 DIAGNOSIS — K219 Gastro-esophageal reflux disease without esophagitis: Secondary | ICD-10-CM | POA: Insufficient documentation

## 2013-10-04 DIAGNOSIS — G4733 Obstructive sleep apnea (adult) (pediatric): Secondary | ICD-10-CM | POA: Insufficient documentation

## 2013-10-04 DIAGNOSIS — M109 Gout, unspecified: Secondary | ICD-10-CM | POA: Insufficient documentation

## 2013-10-04 HISTORY — DX: Malignant neoplasm of unspecified part of unspecified bronchus or lung: C34.90

## 2013-10-04 HISTORY — PX: VIDEO BRONCHOSCOPY WITH ENDOBRONCHIAL NAVIGATION: SHX6175

## 2013-10-04 SURGERY — VIDEO BRONCHOSCOPY WITH ENDOBRONCHIAL NAVIGATION
Anesthesia: General | Site: Chest | Wound class: Clean Contaminated

## 2013-10-04 MED ORDER — LACTATED RINGERS IV SOLN
INTRAVENOUS | Status: DC
  Administered 2013-10-04: 10 mL/h via INTRAVENOUS
  Administered 2013-10-04 (×2): via INTRAVENOUS

## 2013-10-04 MED ORDER — FENTANYL CITRATE 0.05 MG/ML IJ SOLN
INTRAMUSCULAR | Status: DC | PRN
  Administered 2013-10-04: 150 ug via INTRAVENOUS

## 2013-10-04 MED ORDER — PROMETHAZINE HCL 25 MG/ML IJ SOLN: 6.2500 mg | INTRAMUSCULAR | Status: DC | PRN

## 2013-10-04 MED ORDER — MIDAZOLAM HCL 5 MG/5ML IJ SOLN
INTRAMUSCULAR | Status: DC | PRN
  Administered 2013-10-04: 2 mg via INTRAVENOUS

## 2013-10-04 MED ORDER — PROPOFOL 10 MG/ML IV BOLUS
INTRAVENOUS | Status: DC | PRN
  Administered 2013-10-04: 200 mg via INTRAVENOUS

## 2013-10-04 MED ORDER — HYDROMORPHONE HCL PF 1 MG/ML IJ SOLN: 0.2500 mg | INTRAMUSCULAR | Status: DC | PRN

## 2013-10-04 MED ORDER — LIDOCAINE HCL (CARDIAC) 20 MG/ML IV SOLN
INTRAVENOUS | Status: DC | PRN
  Administered 2013-10-04: 80 mg via INTRAVENOUS

## 2013-10-04 MED ORDER — ONDANSETRON HCL 4 MG/2ML IJ SOLN
INTRAMUSCULAR | Status: DC | PRN
  Administered 2013-10-04: 4 mg via INTRAVENOUS

## 2013-10-04 MED ORDER — OXYCODONE HCL 5 MG/5ML PO SOLN: 5.0000 mg | Freq: Once | ORAL | Status: DC | PRN

## 2013-10-04 MED ORDER — 0.9 % SODIUM CHLORIDE (POUR BTL) OPTIME
TOPICAL | Status: DC | PRN
  Administered 2013-10-04: 1000 mL

## 2013-10-04 MED ORDER — ALBUTEROL SULFATE (5 MG/ML) 0.5% IN NEBU
2.5000 mg | INHALATION_SOLUTION | Freq: Once | RESPIRATORY_TRACT | Status: AC
  Administered 2013-10-04: 2.5 mg via RESPIRATORY_TRACT
  Filled 2013-10-04 (×2): qty 0.5

## 2013-10-04 MED ORDER — EPHEDRINE SULFATE 50 MG/ML IJ SOLN
INTRAMUSCULAR | Status: DC | PRN
  Administered 2013-10-04 (×3): 10 mg via INTRAVENOUS
  Administered 2013-10-04: 5 mg via INTRAVENOUS
  Administered 2013-10-04: 10 mg via INTRAVENOUS
  Administered 2013-10-04: 5 mg via INTRAVENOUS

## 2013-10-04 MED ORDER — OXYCODONE HCL 5 MG PO TABS: 5.0000 mg | ORAL_TABLET | Freq: Once | ORAL | Status: DC | PRN

## 2013-10-04 MED ORDER — SUCCINYLCHOLINE CHLORIDE 20 MG/ML IJ SOLN
INTRAMUSCULAR | Status: DC | PRN
  Administered 2013-10-04: 120 mg via INTRAVENOUS

## 2013-10-04 MED ORDER — NEOSTIGMINE METHYLSULFATE 1 MG/ML IJ SOLN
INTRAMUSCULAR | Status: DC | PRN
  Administered 2013-10-04: 4 mg via INTRAVENOUS

## 2013-10-04 MED ORDER — GLYCOPYRROLATE 0.2 MG/ML IJ SOLN
INTRAMUSCULAR | Status: DC | PRN
  Administered 2013-10-04: 0.1 mg via INTRAVENOUS
  Administered 2013-10-04: .8 mg via INTRAVENOUS

## 2013-10-04 MED ORDER — ROCURONIUM BROMIDE 100 MG/10ML IV SOLN
INTRAVENOUS | Status: DC | PRN
  Administered 2013-10-04: 20 mg via INTRAVENOUS

## 2013-10-04 SURGICAL SUPPLY — 34 items
BRUSH CYTOL CELLEBRITY 1.5X140 (MISCELLANEOUS) ×2 IMPLANT
BRUSH SUPERTRAX BIOPSY (INSTRUMENTS) ×2 IMPLANT
BRUSH SUPERTRAX NDL-TIP CYTO (INSTRUMENTS) IMPLANT
CANISTER SUCTION 2500CC (MISCELLANEOUS) ×2 IMPLANT
CHANNEL WORK EXTEND EDGE 180 (KITS) IMPLANT
CHANNEL WORK EXTEND EDGE 45 (KITS) IMPLANT
CHANNEL WORK EXTEND EDGE 90 (KITS) IMPLANT
CLOTH BEACON ORANGE TIMEOUT ST (SAFETY) ×2 IMPLANT
CONT SPEC 4OZ CLIKSEAL STRL BL (MISCELLANEOUS) ×2 IMPLANT
COVER TABLE BACK 60X90 (DRAPES) ×2 IMPLANT
FILTER STRAW FLUID ASPIR (MISCELLANEOUS) IMPLANT
FORCEPS BIOP SUPERTRX PREMAR (INSTRUMENTS) ×2 IMPLANT
GLOVE BIOGEL M STRL SZ7.5 (GLOVE) ×4 IMPLANT
GLOVE SURG SS PI 7.0 STRL IVOR (GLOVE) ×4 IMPLANT
GOWN STRL REIN XL XLG (GOWN DISPOSABLE) ×6 IMPLANT
KIT LOCATABLE GUIDE (CANNULA) IMPLANT
KIT MARKER FIDUCIAL DELIVERY (KITS) IMPLANT
KIT PROCEDURE EDGE 180 (KITS) ×2 IMPLANT
KIT PROCEDURE EDGE 45 (KITS) IMPLANT
KIT PROCEDURE EDGE 90 (KITS) IMPLANT
KIT ROOM TURNOVER OR (KITS) ×2 IMPLANT
MARKER SKIN DUAL TIP RULER LAB (MISCELLANEOUS) ×2 IMPLANT
NEEDLE SUPERTRX PREMARK BIOPSY (NEEDLE) ×2 IMPLANT
NS IRRIG 1000ML POUR BTL (IV SOLUTION) ×2 IMPLANT
OIL SILICONE PENTAX (PARTS (SERVICE/REPAIRS)) ×2 IMPLANT
PAD ARMBOARD 7.5X6 YLW CONV (MISCELLANEOUS) ×4 IMPLANT
PATCHES PATIENT (LABEL) ×2 IMPLANT
SPONGE GAUZE 4X4 12PLY (GAUZE/BANDAGES/DRESSINGS) ×2 IMPLANT
SYR 20CC LL (SYRINGE) ×2 IMPLANT
SYR 20ML ECCENTRIC (SYRINGE) ×2 IMPLANT
SYRINGE TOOMEY DISP (SYRINGE) ×2 IMPLANT
TOWEL OR 17X24 6PK STRL BLUE (TOWEL DISPOSABLE) ×2 IMPLANT
TRAP SPECIMEN MUCOUS 40CC (MISCELLANEOUS) ×2 IMPLANT
TUBE CONNECTING 12X1/4 (SUCTIONS) ×2 IMPLANT

## 2013-10-04 NOTE — Anesthesia Postprocedure Evaluation (Signed)
  Anesthesia Post-op Note  Patient: Ernest [name]  Procedure(s) Performed: Procedure(s): VIDEO BRONCHOSCOPY WITH ENDOBRONCHIAL NAVIGATION (N/A)  Patient Location: PACU  Anesthesia Type:General  Level of Consciousness: awake and alert   Airway and Oxygen Therapy: Patient Spontanous Breathing  Post-op Pain: mild  Post-op Assessment: Respiratory Function Stable  Post-op Vital Signs: stable  Complications: No apparent anesthesia complications

## 2013-10-04 NOTE — Transfer of Care (Signed)
Immediate Anesthesia Transfer of Care Note  Patient: Ernest [name]  Procedure(s) Performed: Procedure(s): VIDEO BRONCHOSCOPY WITH ENDOBRONCHIAL NAVIGATION (N/A)  Patient Location: PACU  Anesthesia Type:General  Level of Consciousness: awake, alert  and oriented  Airway & Oxygen Therapy: Patient Spontanous Breathing and Patient connected to face mask oxygen  Post-op Assessment: Report given to PACU RN, Post -op Vital signs reviewed and stable and Patient moving all extremities X 4  Post vital signs: Reviewed and stable  Complications: No apparent anesthesia complications

## 2013-10-04 NOTE — Interval H&P Note (Signed)
PCCM Interval Note  S: Ernest [name] was in the ED last night with respiratory distress and wheezing. He received BD's, no steroids or abx ordered. Presents this am better. Initially wheezing but closer to baseline now after nebulized albuterol.   O: Filed Vitals:   10/04/13 0801  BP: 132/55  Pulse: 102  Temp: 97.5 F (36.4 C)  TempSrc: Oral  Resp: 18  SpO2: 94%   Gen: Pleasant, well-nourished, in no distress,  normal affect  ENT: No lesions,  mouth clear,  oropharynx clear, no postnasal drip  Neck: No JVD, no TMG, no carotid bruits  Lungs: No use of accessory muscles, distant, B end exp wheezes  Cardiovascular: RRR, heart sounds normal, no murmur or gallops, no peripheral edema  Musculoskeletal: No deformities, no cyanosis or clubbing  Neuro: alert, non focal  Skin: Warm, no lesions or rashes  Dg Chest 2 View  10/03/2013   CLINICAL DATA:  Shortness of breath and chest tightness. History of smoking.  EXAM: CHEST  2 VIEW  COMPARISON:  Chest radiograph performed 09/16/2013, and CT of the chest performed 09/22/2013  FINDINGS: The patient's large left upper lobe mass is grossly unchanged in appearance. The lungs are otherwise relatively clear. No focal consolidation, pleural effusion or pneumothorax is seen.  The heart remains normal in size. No acute osseus abnormalities are identified. Cervical spinal fusion hardware is noted.  IMPRESSION: Stable appearance to large left upper lobe lung mass. Lungs otherwise grossly clear.   Electronically Signed   By: Roanna Raider M.D.   On: 10/03/2013 23:16   Plans:  Discussed case with Dr Jacklynn Bue. I agree that the most straightforward and smoothest way to proceed will be under general anesthesia. I explained to the pt why this would be preferable to conscious sedation. He understands and wants to proceed. Plan is for navigation and LUL biopsies  Levy Pupa, MD, PhD 10/04/2013, 9:51 AM [facility] [phone] or if no  answer [phone]

## 2013-10-04 NOTE — Op Note (Signed)
Video Bronchoscopy with Electromagnetic Navigation Procedure Note  Date of Operation: 10/04/2013  Pre-op Diagnosis: left upper lobe mass  Post-op Diagnosis: non-small cell lung cancer  Surgeon: Levy Pupa  Assistants: none  Anesthesia: General endotracheal anesthesia  Operation: Flexible video fiberoptic bronchoscopy with electromagnetic navigation and biopsies.  Estimated Blood Loss: 10 cc   Complications: none apparent  Indications and History: Ernest [name] is a 57 y.o. male with a long tobacco history and severe COPD.  He is under evaluation by Dr. Maple Hudson for a left upper lobe mass first noted on chest x-ray 09/18/13. CT scan of the chest performed 09/23/13 confirmed a left upper lobe mass and recommendation was to attempt tissue diagnosis via bronchoscopy with electromagnetic navigation. The risks, benefits, complications, treatment options and expected outcomes were discussed with the patient.  The possibilities of pneumothorax, pneumonia, reaction to medication, pulmonary aspiration, perforation of a viscus, bleeding, failure to diagnose a condition and creating a complication requiring transfusion or operation were discussed with the patient who freely signed the consent.    Description of Procedure: The patient was seen in the Preoperative Area, was examined and was deemed appropriate to proceed.  The patient was taken to OR [facility], identified as Ernest [name] and the procedure verified as Flexible Video Fiberoptic Bronchoscopy.  A Time Out was held and the above information confirmed.   Prior to the date of the procedure a high-resolution CT scan of the chest was performed. Utilizing superDimension software a virtual tracheobronchial tree was generated to allow the creation of distinct navigation pathways to the patient's left upper lobe mass. After being taken to the operating room general anesthesia was initiated and the patient  was orally intubated. The video  fiberoptic bronchoscope was introduced via the endotracheal tube and a general inspection was performed which showed somewhat ectatic airways with thin white mucus that was easily suctioned. There were no endobronchial lesions noted throughout the entire exam. The extendable working channel and locator guide were introduced into the bronchoscope. The distinct navigation pathway prepared prior to this procedure was then utilized to navigate to within 0.5 - 1.0 cm of patient's lesion identified on CT scan. The extendable working channel was secured into place and the locator guide was withdrawn. Under fluoroscopic guidance transbronchial brushings, transbronchial Wang needle biopsies, and transbronchial forceps biopsies were performed to be sent for cytology and pathology. Initial frozen section cytology was consistent with non-small cell lung cancer. Further identification is pending the final pathology report. At the end of the procedure a general airway inspection was performed and there was no evidence of active bleeding. The bronchoscope was removed.  The patient tolerated the procedure well. There was no significant blood loss and there were no obvious complications. A post-procedural chest x-ray is pending.  Samples: 1. Transbronchial brushings from left upper lobe 2. Transbronchial Wang needle biopsies from left upper lobe 3. Transbronchial forceps biopsies from left upper lobe   Plans:  The patient will be discharged from the PACU to home when recovered from anesthesia and after chest x-ray is reviewed. We will review the cytology, pathology results with the patient when they become available. Outpatient followup will be with Dr Maple Hudson or Dr Delton Coombes.    Levy Pupa, MD, PhD 10/04/2013, 11:22 AM [facility] [phone] or if no answer [phone]

## 2013-10-04 NOTE — ED Notes (Addendum)
Oxygen removed to assess patient's tolerance of room air. Patient does not normally wear oxygen, but has been on O2 2L via [place] throughout stay this evening. O2 sats were maintaining at 96% with [place]. PA informed.

## 2013-10-04 NOTE — ED Notes (Signed)
Patient maintained saturations at or about 90% during RA trial. PA informed, per provider patient is ok for discharge.

## 2013-10-04 NOTE — H&P (View-Only) (Signed)
HPI:  57 yo man, followed by Dr Young for COPD, chronic bronchitis, OSA on CPAP, CAD, tobacco use, 1/2 pk a day. He had CXR and then CT scan 10/'14 that unfortunately showed LUL mass. He is referred for evaluation for biopsy of the LUL lesion. He is on Arcapta, Advair. He has baseline exertional SOB. No CP, no palpitations. BP is stable.    Past Medical History  Diagnosis Date  . CAD (coronary artery disease)     Left Main 30% stenosis, LAD 20 - 30 % stenosis, first and second diagonal branchesat 40 - 50%  stenosis with small arteries, circumflex had 30% stenosis in the large obtuse marginal, RCA at 70 - 80%  stenosis [not felt to be occlusive after evaluation with flow wire], distal 50 - 60% stenosis - [name][  . COPD (chronic obstructive pulmonary disease)     Dr. Clinton Young  . Depression   . Anxiety   . Hyperlipidemia   . Chronic insomnia   . OSA (obstructive sleep apnea)     NPSG 09/10/10- AHI 11.3/hr  . Gout   . GERD (gastroesophageal reflux disease)   . PVD (peripheral vascular disease)     (ABI 0.9 on right and 0.89 on left)  severe left external iliac stenosis.  He  had successful stenting of his left external iliac per Dr. Cooper.  . DDD (degenerative disc disease)   . Hx of colonoscopy   . Hypertension   . COPD with asthma 09/08/2007     Family History  Problem Relation Age of Onset  . Heart attack Mother   . Heart attack Sister   . Emphysema Sister      History   Social History  . Marital Status: Married    Spouse Name: N/A    Number of Children: N/A  . Years of Education: N/A   Occupational History  . Disabled welder    Social History Main Topics  . Smoking status: Current Some Day Smoker -- 0.20 packs/day for 43 years    Types: Cigarettes    Last Attempt to Quit: 06/24/2013  . Smokeless tobacco: Never Used     Comment: On Chantix. smokes 2- 3 cigarettes daily  . Alcohol Use: No  . Drug Use: No  . Sexual Activity: No   Other Topics Concern   . Not on file   Social History Narrative  . No narrative on file     No Known Allergies   Outpatient Prescriptions Prior to Visit  Medication Sig Dispense Refill  . albuterol (PROVENTIL) (2.5 MG/3ML) 0.083% nebulizer solution USE 1 VIAL IN NEBULIZER EVERY 4 HOURS AS NEEDED FOR WHEEZING OR SHORTNESS OF BREATH  75 mL  prn  . amLODipine (NORVASC) 10 MG tablet TAKE 1 TABLET (10 MG TOTAL) BY MOUTH DAILY.  30 tablet  6  . ARCAPTA NEOHALER 75 MCG CAPS PLACE 1 CAPSULE INTO INHALER AND INHALE DAILY.  30 capsule  5  . ARIPiprazole (ABILIFY) 5 MG tablet Take 1 tablet (5 mg total) by mouth at bedtime.      . atorvastatin (LIPITOR) 20 MG tablet Take 1 tablet (20 mg total) by mouth daily.  90 tablet  3  . carvedilol (COREG) 25 MG tablet Take 1 tablet (25 mg total) by mouth 2 (two) times daily with a meal.  180 tablet  3  . clonazePAM (KLONOPIN) 1 MG tablet Take 1 mg by mouth at bedtime as needed. For anxiety/insomnia      . doxycycline (  VIBRA-TABS) 100 MG tablet Take 1 tablet (100 mg total) by mouth 2 (two) times daily.  20 tablet  0  . esomeprazole (NEXIUM) 40 MG capsule Take 1 capsule (40 mg total) by mouth daily before breakfast.  90 capsule  3  . FLUoxetine (PROZAC) 40 MG capsule Take 40 mg by mouth daily.        . Fluticasone-Salmeterol (ADVAIR DISKUS) 250-50 MCG/DOSE AEPB Inhale 1 puff into the lungs 2 (two) times daily.  1 each  3  . gabapentin (NEURONTIN) 100 MG capsule Take 1 capsule by mouth daily.      . losartan (COZAAR) 25 MG tablet Take 1 tablet (25 mg total) by mouth daily.  90 tablet  3  . mirtazapine (REMERON) 15 MG tablet Take 1.5 tablets (22.5 mg total) by mouth at bedtime.  90 tablet  1  . modafinil (PROVIGIL) 200 MG tablet Take 200 mg by mouth daily.        . morphine (MS CONTIN) 60 MG 12 hr tablet Take 60 mg by mouth 2 (two) times daily.        . Omega-3 Fatty Acids (FISH OIL CONCENTRATE) 1000 MG CAPS Take 1,000 mg by mouth daily.       . potassium chloride SA (K-DUR,KLOR-CON) 20  MEQ tablet Take 20 mEq by mouth daily.      . PROVENTIL HFA 108 (90 BASE) MCG/ACT inhaler INHALE 2 PUFFS INTO THE LUNGS 4 (FOUR) TIMES DAILY AS NEEDED FOR WHEEZING.  6.7 each  10  . varenicline (CHANTIX CONTINUING MONTH PAK) 1 MG tablet Take 1 tablet (1 mg total) by mouth 2 (two) times daily.  60 tablet  5  . VOLTAREN 1 % GEL Apply 2 g topically once a week.        No facility-administered medications prior to visit.    Filed Vitals:   09/27/13 1504  BP: 120/72  Pulse: 71  Height: 5' 7" (1.702 m)  Weight: 184 lb 9.6 oz (83.734 kg)  SpO2: 95%   Gen: Pleasant, well-nourished, in no distress,  normal affect  ENT: No lesions,  mouth clear,  oropharynx clear, no postnasal drip  Neck: No JVD, no TMG, no carotid bruits  Lungs: No use of accessory muscles, coarse on the R, few end exp wheezes best heard L upper field   Cardiovascular: RRR, heart sounds normal, no murmur or gallops, no peripheral edema  Musculoskeletal: No deformities, no cyanosis or clubbing  Neuro: alert, non focal  Skin: Warm, no lesions or rashes    09/23/13 --  COMPARISON: Chest CT 07/16/2012.  FINDINGS:  Mediastinum: Enlarged left hilar lymph node or conglomerate nodal  mass measuring up to 1.8 x 3.1 cm. Enlarged AP window lymph node  measuring 1.3 cm in short axis. No contralateral hilar or definite  paratracheal or subcarinal lymphadenopathy identified at this time.  Heart size is normal. There is no significant pericardial fluid,  thickening or pericardial calcification. There is atherosclerosis of  the thoracic aorta, the great vessels of the mediastinum and the  coronary arteries, including calcified atherosclerotic plaque in the  left anterior descending, left circumflex and right coronary  arteries. Esophagus is unremarkable in appearance. While of the left  upper lobe mass abuts the mediastinum medially (discussed the low)  coming in close proximity to the distal aspect of the aortic arch,  there  does appear to be in a intervening fat plane at this time  suggesting against direct mediastinal invasion.  Lungs/Pleura: Large left upper   lobe mass measuring 5.7 x 4.7 x 5.0  cm in the medial aspect of the left upper lobe (image 16 of series  3) with surrounding areas of ground-glass attenuation and  architectural distortion extending into the apex of the left upper  lobe, most compatible with postobstructive pneumonitis. This mass  makes contact with the mediastinal pleura over approximtealy 1/4 of  its circumference. There are few scattered tiny 2-4 mm nodules  throughout the left upper lobe which may simply represent part of  the postobstructive changes, or may represent tiny satellite tumor  nodules. No other definite suspicious pulmonary nodules are noted at  this time. Very mild centrilobular and paraseptal emphysema. No  acute consolidative stretched at no pleural effusions.  Upper Abdomen: Incompletely visualized low-attenuation lesion in the  posterior aspect of the spleen which measures at least 5.0 x 3.4 cm,  similar in retrospect compared to prior studies, favored to  represent a benign lesion such as a splenic cyst.  Musculoskeletal: There are no aggressive appearing lytic or blastic  lesions noted in the visualized portions of the skeleton.  IMPRESSION:  1. 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 This is associated with  some postobstructive changes in the left upper lobe as well as left  hilar and AP window lymphadenopathy. Based on these findings, and  assuming lack of distant metastatic disease (which will have to be  proven by other imaging studies), this is favored to represent at  least T2b (if not T3 because of satellite nodules in the left upper  lobe), N2, Mx disease (i.e., at least stage IIIA disease). Further  evaluation with PET-CT and brain MRI with and without IV gadolinium  is suggested at this  time for complete staging.  2. Mild centrilobular and paraseptal emphysema.  3. Atherosclerosis, including 3 vessel coronary artery disease.  Assessment for potential risk factor modification, dietary therapy  or pharmacologic therapy may be warranted, if clinically indicated.  4. Additional incidental findings, as above.   Lung nodule, hx of LUL nodule/ mass on Ct scan 09/23/13, suspicious for malignancy. I believe there is a chance to get a good bx through conventional FOB, but highest yield strategy would be FOB + ENB. Will attempt to set up in the OR, start with FOB under conscious sedation but progress to intubation and ENB if the frozen bx's are non-diagnostic.        

## 2013-10-04 NOTE — Preoperative (Signed)
Beta Blockers   Reason not to administer Beta Blockers:coreg 11-10

## 2013-10-04 NOTE — Anesthesia Preprocedure Evaluation (Addendum)
Anesthesia Evaluation  Patient identified by MRN, date of birth, ID band Patient awake    Reviewed: Allergy & Precautions, H&P , NPO status , Patient's Chart, lab work & pertinent test results  Airway Mallampati: II  Neck ROM: Full    Dental   Pulmonary asthma , sleep apnea , COPDCurrent Smoker,  + rhonchi   + wheezing      Cardiovascular hypertension, + CAD and + Peripheral Vascular Disease Rhythm:Regular Rate:Normal     Neuro/Psych Anxiety Depression    GI/Hepatic GERD-  ,  Endo/Other    Renal/GU      Musculoskeletal   Abdominal (+) + obese,   Peds  Hematology   Anesthesia Other Findings   Reproductive/Obstetrics                          Anesthesia Physical Anesthesia Plan  ASA: III  Anesthesia Plan: General   Post-op Pain Management:    Induction: Intravenous  Airway Management Planned: Oral ETT  Additional Equipment:   Intra-op Plan:   Post-operative Plan: Extubation in OR and Possible Post-op intubation/ventilation  Informed Consent: I have reviewed the patients History and Physical, chart, labs and discussed the procedure including the risks, benefits and alternatives for the proposed anesthesia with the patient or authorized representative who has indicated his/her understanding and acceptance.   Dental advisory given  Plan Discussed with:   Anesthesia Plan Comments: (Plan Albuterol nebulizer treatment thia am before surgery)       Anesthesia Quick Evaluation

## 2013-10-04 NOTE — Anesthesia Procedure Notes (Addendum)
Procedure Name: Intubation Date/Time: 10/04/2013 10:05 AM Performed by: Reine Just Pre-anesthesia Checklist: Patient identified, Emergency Drugs available, Suction available, Patient being monitored and Timeout performed Patient Re-evaluated:Patient Re-evaluated prior to inductionOxygen Delivery Method: Circle system utilized and Simple face mask Preoxygenation: Pre-oxygenation with 100% oxygen Intubation Type: IV induction Ventilation: Mask ventilation without difficulty Laryngoscope Size: Mac and 4 Grade View: Grade II Tube type: Oral Tube size (mm): 10. Number of attempts: 2 Airway Equipment and Method: Patient positioned with wedge pillow,  Stylet and LTA kit utilized Placement Confirmation: ETT inserted through vocal cords under direct vision,  positive ETCO2 and breath sounds checked- equal and bilateral Secured at: 23 cm Tube secured with: Tape Dental Injury: Teeth and Oropharynx as per pre-operative assessment

## 2013-10-06 ENCOUNTER — Encounter (HOSPITAL_COMMUNITY): Payer: Self-pay | Admitting: Emergency Medicine

## 2013-10-06 NOTE — ED Provider Notes (Signed)
Medical screening examination/treatment/procedure(s) were performed by non-physician practitioner and as supervising physician I was immediately available for consultation/collaboration.  EKG Interpretation     Ventricular Rate:  77 PR Interval:  160 QRS Duration: 82 QT Interval:  404 QTC Calculation: 457 R Axis:   93 Text Interpretation:  Sinus rhythm with Premature atrial complexes Rightward axis Nonspecific ST abnormality Abnormal ECG ED PHYSICIAN INTERPRETATION AVAILABLE IN [place] Beather Arbour, MD 10/06/13 [phone]

## 2013-10-07 ENCOUNTER — Encounter: Payer: Self-pay | Admitting: Internal Medicine

## 2013-10-07 ENCOUNTER — Ambulatory Visit (INDEPENDENT_AMBULATORY_CARE_PROVIDER_SITE_OTHER): Payer: Medicare PPO | Admitting: Internal Medicine

## 2013-10-07 VITALS — BP 136/80 | HR 66 | Ht 67.0 in | Wt 183.6 lb

## 2013-10-07 DIAGNOSIS — C3492 Malignant neoplasm of unspecified part of left bronchus or lung: Secondary | ICD-10-CM

## 2013-10-07 DIAGNOSIS — J449 Chronic obstructive pulmonary disease, unspecified: Secondary | ICD-10-CM

## 2013-10-07 DIAGNOSIS — R51 Headache: Secondary | ICD-10-CM

## 2013-10-07 DIAGNOSIS — F17200 Nicotine dependence, unspecified, uncomplicated: Secondary | ICD-10-CM

## 2013-10-07 DIAGNOSIS — G4733 Obstructive sleep apnea (adult) (pediatric): Secondary | ICD-10-CM

## 2013-10-07 DIAGNOSIS — Z23 Encounter for immunization: Secondary | ICD-10-CM

## 2013-10-07 DIAGNOSIS — J4489 Other specified chronic obstructive pulmonary disease: Secondary | ICD-10-CM

## 2013-10-07 DIAGNOSIS — C3490 Malignant neoplasm of unspecified part of unspecified bronchus or lung: Secondary | ICD-10-CM

## 2013-10-07 DIAGNOSIS — C3491 Malignant neoplasm of unspecified part of right bronchus or lung: Secondary | ICD-10-CM | POA: Insufficient documentation

## 2013-10-07 NOTE — Assessment & Plan Note (Signed)
Generally compliant with CPAP

## 2013-10-07 NOTE — Assessment & Plan Note (Signed)
Trying harder to quit- encouraged

## 2013-10-07 NOTE — Assessment & Plan Note (Signed)
Plan- Prevnar conjugate pneumonia vax. Continue present meds. I would like to stay off prednisone for now, but if he gets tighter, that will be what he needs.

## 2013-10-07 NOTE — Assessment & Plan Note (Signed)
I reviewed images and options today with Khye and his wife. I am concerned about recent headache- met until proven otherwise.  He is not a surgical candidate based on PFT and apparent extension to L hilum.  Plan- referral to Dr [facility], MRI brain, PET. Anticipate subsequent eval by Rad Onc

## 2013-10-07 NOTE — Patient Instructions (Signed)
Order- referral to Oncology Dr Arbutus Ped   Dx squamous cell carcinoma lung  Order- schedule PET neck to thigh     Order- schedule MRI brain with and without contrast  Dx squamous cell carcinoma lung, headache  Pneumonia vaccine conjugate 13

## 2013-10-07 NOTE — Progress Notes (Signed)
Patient ID: Ernest [name], male    DOB: 03-Mar-1956, 57 y.o.   MRN: [id number]  HPI 07/30/11- 40 yoM smoker followed for COPD/ bronchitis, OSA/ CPAP Last here 04/01/11- note reviewed. He had felt better on a trial of maintenance prednisone. He has felt more short of breath through the hot summer, with no acute event and without blood, chest pain or fever. Easy DOE around home. Has been sharing nebulizer with his wife, who now has a cold and we discussed this. Has been worse in last month, and expecially in recent rainy weather. Noting a little ankle swelling. Smothered trying to take a shower.  Only tolerating his CPAP about 3 nights/ week because he feels smothered, waking sore in anterior chest in the mornings.  Now down to only 2-3 cigarettes/ day and trying to stop.  CXR 08/14/10- Nl NAD PFT 05/26/08- FEV1/FVC 2.44/ 0.76, incr 19% w/ BD, R 0.59 ?DLCO 0.69  09/10/11- 55 yoM smoker followed for COPD/ bronchitis, OSA/ CPAP He reports feeling better "finally doing okay". He is down to just a couple of cigarettes a day. He is using CPAP regularly now and is comfortable with it. Alpha I antitrypsin Report came back normal "M. M." on 08/09/2011. Chest x-ray from 08/15/2011 showed stable changes of COPD. He has had flu vaccine.  11/12/11- 46 yoM smoker followed for COPD/ bronchitis, OSA/ CPAP Here with wife. Has had flu shot. He says he was doing well until 2 weeks ago. He again is coughing thick mucus which is difficult to clear. He depends on his metered inhaler and nebulizer to help clear his airways. Started Mucinex 3 days ago. Mucus is white. He denies fever, sore throat, chest pain, GI upset. Because of orthopnea he is sitting up to sleep for the past 2 nights.  01/14/12- 55 yoM smoker followed for COPD/ bronchitis, OSA/ CPAP Increase congested cough green to yellow sputum but he does not feel sick and denies fever. Sister who was a smoker recently died of lung cancer. Despite that and all of our  discussions, he still smokes a few cigarettes daily. We talked again about motivation to stop completely. Continues CPAP and Provigil one or 2 daily.  03/10/12  56 yoM smoker followed for COPD/ bronchitis, OSA/ CPAP Persistent bronchitic cough. Sputum is not purulent. He is "working on" his smoking habit but blames pollen for keeping him S. coughing now. Remains fully compliant with CPAP, all night every night. Still needs Provigil to help with residual daytime sleepiness as before.  05/12/12- 56 yoM smoker followed for COPD/ bronchitis, OSA/ CPAP Stays SOB all the time-even at rest; wheezing, coughing-productive-yellow in color, chest congestion, nasal congestion,. He is more interested today in smoking cessation which we discussed. He had quit for Daliresp after 10 days because of sustained nausea. He is taking all of his regular medicines and using his rescue inhaler several times a day. Off prednisone now for 1-2 months. CXR 01/23/12-  IMPRESSION:  No acute cardiopulmonary abnormality.  Original Report Authenticated By: Randall An, M.D.   07/13/12- 45 yoM smoker followed for COPD/ bronchitis, OSA/ CPAP, complicated by CAD Pt states increased sob,wheezing,productive cough x3 months. Still smoking with no serious effort to stop. His sister has lung cancer which has gotten his attention and he may be willing to try harder. Some days his breathing is better than others with no real trend. Coughing productive of white sputum with no blood. He has all of his medications currently.  07/30/12- 10 yoM  smoker followed for COPD/ bronchitis, OSA/ CPAP, complicated by CAD Cough-productive-yellow and thick; SOB, wheezing, chest congestion, head congestion x 1 week; O2 sat of 83% RA entered room-20 seconds RA sat of 90% CT 07/21/12-  IMPRESSION:  1. Tiny bilateral lung nodules which are similar to on the prior  exam, given differences in slice selection and measurement error.  Likely subpleural lymph  nodes.  2. Age advanced coronary artery atherosclerosis. Recommend  assessment of coronary risk factors and consideration of medical  therapy.  3. Slight enlargement of splenic lesion since 2007. Favored to  represent a hemangioma or lymphangioma.  Original Report Authenticated By: Areta Haber, M.D.   08/07/12- 55 yoM smoker followed for COPD/ bronchitis, OSA/ CPAP, complicated by CAD, disability for back pain. Cough-productive-white mostly; SOB , wheezing-worse at night     wife here Coughing spasms he can feel his upper airway is closing off. He he still smokes a little even when he feels badly and we went over this again today offering support.  11/13/12- 56 yoM smoker followed for COPD/ bronchitis, OSA/ CPAP, complicated by CAD, disability for back pain. FOLLOWS RCV:ELFYB still but not productive-more SOB  Went to ER in September or acute exacerbation of COPD but not admitted. Today he is a scheduled visit. He actually says he is doing pretty well with no acute problems. Perennial nasal congestion. He has reduced his cigarette smoking to about 2 per day and is strongly encouraged to stop completely now.  01/25/13-  57 yoM smoker followed for COPD/ bronchitis, OSA/ CPAP, complicated by CAD, disability for back pain. ACUTE VISIT: started smoking agian-discuss taking Chantix; feels raw in chest area with deep cough x 2 weeks. He feels well at this visit, meaning he is at his baseline some daily nonproductive cough and shortness of breath with exertion.  05/17/13-57 yoM smoker followed for COPD/ bronchitis, OSA/ CPAP, complicated by CAD, disability for back pain. FOLLOWS OFB:PZWCHENID is about the same as last visit; has lessened the about the amount he is smoking. Chantix helps. Continues CPAP/ 10/[employer] Discussed smoking cessation effort.  06/10/13- 57 yoM smoker followed for COPD/ bronchitis, OSA/ CPAP, complicated by CAD, disability for back pain. ACUTE VISIT: prod cough with yellow/green  mucus, increased SOB, wheezing, tightness in chest, chills x1 week.  still on augmentin and pred taper from 7/15 phone note > no better Trying Chantix. Wife is also sick. There exposed to his brother's child who has frequent colds. Just started prednisone taper and Augmentin yesterday. Had teeth pulled.  09/16/13- 57 yoM smoker followed for COPD/ bronchitis, OSA/ CPAP, complicated by CAD, disability for back pain. FOLLOWS FOR: was given the flu shot about 1 month and has not felt good since; continues to keep a headache, chest congestion at night. Describes frontal headache, head and chest congestion since a flu shot. Using Chantix, has reduced smoking to 2 or 3 cigarettes daily. Denies infection-no fever, sputum not purulent or bloody. No chest pain. Stays on Mucinex. Not using CPAP because of head congestion. Sister has died of lung cancer/smoker.  10/21/2013- 57 yoM smoker followed for COPD/ bronchitis, OSA/ CPAP, SqCell CA LUL,complicated by CAD, disability for back pain. FOLLOWS FOR: review bx results with patient; continues to have bad frontal lobe headache and lots of congestion. Nausea last night. No vomiting. Abnl CXR > CT chest/ large LUL mass to hilum. We had notified pt and scheduled bx. He is not coughing up much, denies chest pain. New headache mostly left frontal and  retro-orbital x 1 month with no lateralizing neuro. Remains tight on routine meds, off prednisone.  His wife blames "anxiety" for ER trip/ nebulizer the night before his bronchoscopy. Sister recently died of small cell Ca lung.  CT chest 09/17/13- IMPRESSION:  1. 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 This is associated with  some postobstructive changes in the left upper lobe as well as left  hilar and AP window lymphadenopathy. Based on these findings, and  assuming lack of distant metastatic disease (which will have to be  proven by other imaging  studies), this is favored to represent at  least T2b (if not T3 because of satellite nodules in the left upper  lobe), N2, Mx disease (i.e., at least stage IIIA disease). Further  evaluation with PET-CT and brain MRI with and without IV gadolinium  is suggested at this time for complete staging.  2. Mild centrilobular and paraseptal emphysema.  3. Atherosclerosis, including 3 vessel coronary artery disease.  Assessment for potential risk factor modification, dietary therapy  or pharmacologic therapy may be warranted, if clinically indicated.  4. Additional incidental findings, as above.  Electronically Signed  By: Trudie Reed M.D.  On: 09/22/2013 11:34  Bronch / needle bx 10/04/13- by Dr Delton Coombes Pos Squamous Cell Ca  Review of Systems- see HPI Constitutional:   No-   weight loss, night sweats, fevers, chills,+ fatigue, lassitude. HEENT:  + headaches, difficulty swallowing, tooth/dental problems, sore throat,       No-  sneezing, itching, ear ache, +nasal congestion, post nasal drip,  CV:   No chest pain, orthopnea, PND,  Little swelling in lower extremities,  No-dizziness, palpitations Resp: +  shortness of breath with exertion or at rest.             + productive cough,  + non-productive cough,  No-  coughing up of blood.              No-   change in color of mucus.  +some wheezing.   Skin: No-   rash or lesions. GI:  No-   heartburn, indigestion, abdominal pain, nausea, vomiting, GU:  MS:  No-   joint pain or swelling.   Neuro- nothing unusual  Psych:  No- change in mood or affect. No depression or anxiety.  No memory loss.    Objective:   Physical Exam General- Alert, Oriented, Affect-appropriate, Distress- none acute   Stocky/ overweight,  Skin- rash-none, lesions- none, excoriation- none.      Lymphadenopathy- none Head- atraumatic            Eyes- Gross vision intact, +PERRLA, conjunctivae- injected. + peri-orbital edema            Ears- Hearing, canals- normal             Nose- +turbinate edema, No- Septal dev, +mucus bridging, no-polyps, erosion, perforation             Throat- Mallampati III , mucosa red , drainage- none, tonsils- atrophic,  Neck- flexible , trachea midline, no stridor , thyroid nl, carotid no bruit Chest - symmetrical excursion , unlabored           Heart/CV- RRR , no murmur , no gallop  , no rub, nl s1 s2                           - JVD- none ,  edema-none, stasis changes- none, varices- none           Lung-+ distant /Coarse, unlabored,  + light wheeze, no- cough , dullness-none, rub- none           Chest wall-  Abd-  Br/ Gen/ Rectal- Not done, not indicated Extrem- cyanosis- none, clubbing, none, atrophy- none, strength- nl. 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 Neuro- grossly intact to observation

## 2013-10-08 ENCOUNTER — Telehealth: Payer: Self-pay | Admitting: *Deleted

## 2013-10-08 ENCOUNTER — Other Ambulatory Visit (INDEPENDENT_AMBULATORY_CARE_PROVIDER_SITE_OTHER): Payer: Medicare PPO

## 2013-10-08 ENCOUNTER — Encounter: Payer: Self-pay | Admitting: Cardiology

## 2013-10-08 DIAGNOSIS — R7309 Other abnormal glucose: Secondary | ICD-10-CM

## 2013-10-08 DIAGNOSIS — I10 Essential (primary) hypertension: Secondary | ICD-10-CM

## 2013-10-08 LAB — BASIC METABOLIC PANEL
BUN: 10 mg/dL (ref 6–23)
CO2: 29 mEq/L (ref 19–32)
Calcium: 9.5 mg/dL (ref 8.4–10.5)
GFR: 103.74 mL/min (ref >60.00)
Glucose, Bld: 104 mg/dL — ABNORMAL HIGH (ref 70–99)
Potassium: 3.1 mEq/L — ABNORMAL LOW (ref 3.5–5.1)
Sodium: 136 mEq/L (ref 135–145)

## 2013-10-08 NOTE — Telephone Encounter (Signed)
Called with appt with Dr. Arbutus Ped 10/20/13 at 11:00 labs and 11:30 Dr. Arbutus Ped.  Wife verbalized understanding of time and place of appt

## 2013-10-11 ENCOUNTER — Telehealth: Payer: Self-pay | Admitting: Internal Medicine

## 2013-10-11 NOTE — Telephone Encounter (Signed)
C/D 10/11/13 for appt. 10/20/13 °

## 2013-10-15 ENCOUNTER — Ambulatory Visit: Payer: Medicare PPO | Admitting: Internal Medicine

## 2013-10-18 ENCOUNTER — Encounter: Payer: Self-pay | Admitting: Internal Medicine

## 2013-10-18 ENCOUNTER — Ambulatory Visit (INDEPENDENT_AMBULATORY_CARE_PROVIDER_SITE_OTHER): Payer: Medicare PPO | Admitting: Internal Medicine

## 2013-10-18 VITALS — BP 130/62 | HR 72 | Temp 99.0°F | Ht 67.0 in | Wt 184.0 lb

## 2013-10-18 DIAGNOSIS — C3492 Malignant neoplasm of unspecified part of left bronchus or lung: Secondary | ICD-10-CM

## 2013-10-18 DIAGNOSIS — C3490 Malignant neoplasm of unspecified part of unspecified bronchus or lung: Secondary | ICD-10-CM

## 2013-10-18 MED ORDER — ALPRAZOLAM 0.25 MG PO TABS: 0.2500 mg | ORAL_TABLET | Freq: Two times a day (BID) | ORAL | Status: DC | PRN

## 2013-10-18 NOTE — Progress Notes (Signed)
Subjective:    Patient ID: Ernest [name], male    DOB: 06/29/1956, 57 y.o.   MRN: [id number]  HPI  57 y/o white male with hx of tobacco use, COPD and CAD for follow up.  Since previous visit, patient diagnosed with lung cancer ( squamous cell carcinoma ).  CT of chest notes 5.7 x 4.7 x 5.0 cm left upper lobe mass.    Htn - stable  Abnormal glucose - stable.  Review of Systems Patient feeling anxious, negative for shortness of breath.  Chronic intermittent headaches.    Past Medical History  Diagnosis Date  . CAD (coronary artery disease)     Left Main 30% stenosis, LAD 20 - 30 % stenosis, first and second diagonal branchesat 40 - 50%  stenosis with small arteries, circumflex had 30% stenosis in the large obtuse marginal, RCA at 70 - 80%  stenosis [not felt to be occlusive after evaluation with flow wire], distal 50 - 60% stenosis - [name][  . COPD (chronic obstructive pulmonary disease)     Dr. Jetty Duhamel  . Depression   . Anxiety   . Hyperlipidemia   . Chronic insomnia   . Gout   . GERD (gastroesophageal reflux disease)   . PVD (peripheral vascular disease)     (ABI 0.9 on right and 0.89 on left)  severe left external iliac stenosis.  He  had successful stenting of his left external iliac per Dr. Excell Seltzer.  . DDD (degenerative disc disease)   . Hx of colonoscopy   . 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 Cancer     lung  . OSA (obstructive sleep apnea)     NPSG 09/10/10- AHI 11.3/hr  . Hypertension     dr hochrein    History   Social History  . Marital Status: Married    Spouse Name: N/A    Number of Children: N/A  . Years of Education: N/A   Occupational History  . Disabled welder    Social History Main Topics  . Smoking status: Former Smoker -- 0.50 packs/day for 43 years    Types: Cigarettes    Quit date: 09/23/2013  . Smokeless tobacco: Never Used     Comment: On Chantix. smokes 2- 3 cigarettes daily  . Alcohol Use: No  . Drug Use: No  . Sexual  Activity: No   Other Topics Concern  . Not on file   Social History Narrative  . No narrative on file    Past Surgical History  Procedure Laterality Date  . Spinal fusion  03/05/2007    L4-L5  . Hip surgery      left  . Arm surgery      left  . Shoulder surgery      right  . C-spine surgery    . Angioplasty    . Video bronchoscopy with endobronchial navigation N/A 10/04/2013    Procedure: VIDEO BRONCHOSCOPY WITH ENDOBRONCHIAL NAVIGATION;  Surgeon: Leslye Peer, MD;  Location: [facility] OR;  Service: Thoracic;  Laterality: N/A;    Family History  Problem Relation Age of Onset  . Heart attack Mother   . Heart attack Sister   . Lung cancer Sister   . Emphysema Sister     No Known Allergies  Current Outpatient Prescriptions on File Prior to Visit  Medication Sig Dispense Refill  . albuterol (PROVENTIL HFA;VENTOLIN HFA) 108 (90 BASE) MCG/ACT inhaler Inhale 2 puffs into the lungs every 4 (four) hours as needed for wheezing  or shortness of breath.      [name] albuterol (PROVENTIL) (2.5 MG/3ML) 0.083% nebulizer solution Take 2.5 mg by nebulization every 4 (four) hours as needed for wheezing or shortness of breath.      [name] amLODipine (NORVASC) 10 MG tablet Take 10 mg by mouth daily.      . ARIPiprazole (ABILIFY) 5 MG tablet Take 1 tablet (5 mg total) by mouth at bedtime.      [name] atorvastatin (LIPITOR) 20 MG tablet Take 1 tablet (20 mg total) by mouth daily.  90 tablet  3  . carvedilol (COREG) 25 MG tablet Take 1 tablet (25 mg total) by mouth 2 (two) times daily with a meal.  180 tablet  3  . clonazePAM (KLONOPIN) 1 MG tablet Take 1 mg by mouth at bedtime as needed. For anxiety/insomnia      . esomeprazole (NEXIUM) 40 MG capsule Take 1 capsule (40 mg total) by mouth daily before breakfast.  90 capsule  3  . FLUoxetine (PROZAC) 40 MG capsule Take 40 mg by mouth daily.        . Fluticasone-Salmeterol (ADVAIR DISKUS) 250-50 MCG/DOSE AEPB Inhale 1 puff into the lungs 2 (two) times daily.  1 each   3  . gabapentin (NEURONTIN) 100 MG capsule Take 100 mg by mouth daily.       . Indacaterol Maleate (ARCAPTA NEOHALER) 75 MCG CAPS Place 1 capsule into inhaler and inhale daily.      [name] losartan (COZAAR) 25 MG tablet Take 1 tablet (25 mg total) by mouth daily.  90 tablet  3  . mirtazapine (REMERON) 15 MG tablet Take 1.5 tablets (22.5 mg total) by mouth at bedtime.  90 tablet  1  . modafinil (PROVIGIL) 200 MG tablet Take 200 mg by mouth daily.       [name] morphine (MS CONTIN) 60 MG 12 hr tablet Take 60 mg by mouth 2 (two) times daily.        . Omega-3 Fatty Acids (FISH OIL CONCENTRATE) 1000 MG CAPS Take 1,000 mg by mouth daily.       [name] oxyCODONE-acetaminophen (PERCOCET) 10-325 MG per tablet Take 1 tablet by mouth 3 (three) times daily as needed. For pain      . potassium chloride SA (K-DUR,KLOR-CON) 20 MEQ tablet Take 20 mEq by mouth daily.      . varenicline (CHANTIX CONTINUING MONTH PAK) 1 MG tablet Take 1 tablet (1 mg total) by mouth 2 (two) times daily.  60 tablet  5  . VOLTAREN 1 % GEL Apply 2 g topically daily as needed (for pain).        No current facility-administered medications on file prior to visit.    BP 130/62  Pulse 72  Temp(Src) 99 F (37.2 C) (Oral)  Ht 5\' 7"  (1.702 m)  Wt 184 lb (83.462 kg)  BMI 28.81 kg/m2    Objective:   Physical Exam  Constitutional: He is oriented to person, place, and time. He appears well-developed and well-nourished.  HENT:  Head: Normocephalic and atraumatic.  Neck: Neck supple.  Cardiovascular: Normal rate, regular rhythm and normal heart sounds.   No murmur heard. Pulmonary/Chest: Effort normal and breath sounds normal. He has no wheezes.  Neurological: He is alert and oriented to person, place, and time. No cranial nerve deficit.  Psychiatric: He has a normal mood and affect. His behavior is normal.          Assessment & Plan:

## 2013-10-18 NOTE — Assessment & Plan Note (Signed)
Awaiting MRI of brain and referral to Dr. Arbutus Ped.  Use alprazolam for anxiety symptoms as needed.

## 2013-10-18 NOTE — Progress Notes (Signed)
Pre visit review using our clinic review tool, if applicable. No additional management support is needed unless otherwise documented below in the visit note. 

## 2013-10-19 ENCOUNTER — Ambulatory Visit (HOSPITAL_COMMUNITY)
Admission: RE | Admit: 2013-10-19 | Discharge: 2013-10-19 | Disposition: A | Discharge status: Home / Self Care | Payer: Medicare PPO | Source: Ambulatory Visit | Attending: Internal Medicine | Admitting: Internal Medicine

## 2013-10-19 ENCOUNTER — Other Ambulatory Visit: Payer: Self-pay | Admitting: Internal Medicine

## 2013-10-19 ENCOUNTER — Encounter (HOSPITAL_COMMUNITY)
Admission: RE | Admit: 2013-10-19 | Discharge: 2013-10-19 | Disposition: A | Discharge status: Home / Self Care | Payer: Medicare PPO | Source: Ambulatory Visit | Attending: Internal Medicine | Admitting: Internal Medicine

## 2013-10-19 ENCOUNTER — Encounter (HOSPITAL_COMMUNITY): Payer: Self-pay

## 2013-10-19 DIAGNOSIS — Q288 Other specified congenital malformations of circulatory system: Secondary | ICD-10-CM | POA: Insufficient documentation

## 2013-10-19 DIAGNOSIS — C3490 Malignant neoplasm of unspecified part of unspecified bronchus or lung: Secondary | ICD-10-CM

## 2013-10-19 DIAGNOSIS — R51 Headache: Secondary | ICD-10-CM

## 2013-10-19 DIAGNOSIS — C771 Secondary and unspecified malignant neoplasm of intrathoracic lymph nodes: Secondary | ICD-10-CM | POA: Insufficient documentation

## 2013-10-19 DIAGNOSIS — J3801 Paralysis of vocal cords and larynx, unilateral: Secondary | ICD-10-CM | POA: Insufficient documentation

## 2013-10-19 DIAGNOSIS — D739 Disease of spleen, unspecified: Secondary | ICD-10-CM | POA: Insufficient documentation

## 2013-10-19 MED ORDER — GADOBENATE DIMEGLUMINE 529 MG/ML IV SOLN
18.0000 mL | Freq: Once | INTRAVENOUS | Status: AC | PRN
  Administered 2013-10-19: 18 mL via INTRAVENOUS

## 2013-10-19 MED ORDER — FLUDEOXYGLUCOSE F - 18 (FDG) INJECTION
18.3000 | Freq: Once | INTRAVENOUS | Status: AC | PRN
  Administered 2013-10-19: 18.3 via INTRAVENOUS

## 2013-10-20 ENCOUNTER — Other Ambulatory Visit (HOSPITAL_BASED_OUTPATIENT_CLINIC_OR_DEPARTMENT_OTHER): Payer: Medicare PPO | Admitting: Lab

## 2013-10-20 ENCOUNTER — Other Ambulatory Visit: Payer: Self-pay | Admitting: Internal Medicine

## 2013-10-20 ENCOUNTER — Ambulatory Visit (HOSPITAL_BASED_OUTPATIENT_CLINIC_OR_DEPARTMENT_OTHER): Payer: Medicare PPO | Admitting: Internal Medicine

## 2013-10-20 ENCOUNTER — Telehealth: Payer: Self-pay | Admitting: Internal Medicine

## 2013-10-20 ENCOUNTER — Other Ambulatory Visit: Payer: Self-pay | Admitting: *Deleted

## 2013-10-20 ENCOUNTER — Encounter: Payer: Self-pay | Admitting: Internal Medicine

## 2013-10-20 ENCOUNTER — Ambulatory Visit: Payer: Commercial Managed Care - HMO

## 2013-10-20 VITALS — BP 147/73 | HR 80 | Temp 97.1°F | Resp 18 | Ht 65.0 in | Wt 181.3 lb

## 2013-10-20 DIAGNOSIS — C3490 Malignant neoplasm of unspecified part of unspecified bronchus or lung: Secondary | ICD-10-CM

## 2013-10-20 DIAGNOSIS — C3410 Malignant neoplasm of upper lobe, unspecified bronchus or lung: Secondary | ICD-10-CM

## 2013-10-20 DIAGNOSIS — R630 Anorexia: Secondary | ICD-10-CM

## 2013-10-20 DIAGNOSIS — C3492 Malignant neoplasm of unspecified part of left bronchus or lung: Secondary | ICD-10-CM

## 2013-10-20 DIAGNOSIS — F411 Generalized anxiety disorder: Secondary | ICD-10-CM

## 2013-10-20 DIAGNOSIS — G4700 Insomnia, unspecified: Secondary | ICD-10-CM

## 2013-10-20 LAB — CBC WITH DIFFERENTIAL/PLATELET
BASO%: 0.6 % (ref 0.0–2.0)
Basophils Absolute: 0.1 10*3/uL (ref 0.0–0.1)
HCT: 42.6 % (ref 38.4–49.9)
HGB: 13.5 g/dL (ref 13.0–17.1)
LYMPH%: 11.4 % — ABNORMAL LOW (ref 14.0–49.0)
MCHC: 31.8 g/dL — ABNORMAL LOW (ref 32.0–36.0)
MCV: 84.9 fL (ref 79.3–98.0)
MONO%: 7.7 % (ref 0.0–14.0)
Platelets: 168 10*3/uL (ref 140–400)
RBC: 5.01 10*6/uL (ref 4.20–5.82)
WBC: 10.8 10*3/uL — ABNORMAL HIGH (ref 4.0–10.3)

## 2013-10-20 LAB — COMPREHENSIVE METABOLIC PANEL (CC13)
ALT: 18 U/L (ref 0–55)
Alkaline Phosphatase: 122 U/L (ref 40–150)
Chloride: 103 mEq/L (ref 98–109)
Sodium: 139 mEq/L (ref 136–145)
Total Bilirubin: 0.43 mg/dL (ref 0.20–1.20)
Total Protein: 7 g/dL (ref 6.4–8.3)

## 2013-10-20 NOTE — Progress Notes (Signed)
[name] [phone]   [phone]  CONSULT NOTE  REFERRING PHYSICIAN: Dr. Jetty Duhamel  REASON FOR CONSULTATION:  57 years old white male recently diagnosed with lung cancer  HPI Ernest [name] is a 57 y.o. male with a past medical history significant for multiple medical problems including history of COPD, coronary artery disease status post angioplasty, obstructive sleep apnea, dyslipidemia, GERD, anxiety, peripheral vascular disease, hypertension as well as long history of smoking but quit in October of 2014. The patient mentions that he has been complaining with headache 4 months in addition to chest congestion. He was seen last month by Dr. Maple Hudson and chest x-ray was performed on 09/18/2013. It showed new left suprahilar soft tissue mass concerning for malignancy. This was followed by CT scan of the chest on 09/23/2013 and it showed 5.7 x 4.7 x 5.0 cm left upper lobe mass which most likely represents a primary bronchogenic carcinoma. This is associated with some postobstructive changes in the left upper lobe as well as left hilar and AP window lymphadenopathy. Based on these findings, and assuming lack of distant metastatic disease (which will have to be proven by other imaging studies), this is favored to represent at least T2b (if not T3 because of satellite nodules in the left upper lobe), N2, Mx disease (i.e., at least stage IIIA disease). The patient was referred to Dr. Delton Coombes and on 10/04/2013 he underwent a video bronchoscopy with electromagnetic navigational bronchoscopy. The final pathology (Accession: [id number]) was consistent with squamous cell carcinoma. The patient had a PET scan on 10/20/2013 and it showed hypermetabolic left suprahilar mass abuts the pleural surface of the mediastinum. 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 There is no  contralateral nodal metastasis, supraclavicular nodal metastasis or distant metastasis. MRI of the brain performed on the same day showed no evidence of intracranial metastasis.  Dr. Maple Hudson kindly referred the patient to me today for further evaluation and recommendation regarding treatment of his condition. The patient is very anxious and worried about his recent diagnosis especially after his sister recently died from lung cancer. He continues to complain of pain on the left chest was radiation to the back. He also has lack of appetite with insomnia and he take Klonopin and Xanax for anxiety. He has shortness breath 40 years but was getting worse over the last 6 months. He also has cough Productive of whitish sputum. He lost around 22 pounds over the last 4 weeks. The patient continues to complain of headaches and blurred vision. Family history significant for mother who had by our disease, father died from surgical complications and sister died from lung cancer . The patient is married and currently has no children. His only son deceased. He was accompanied by his wife Ernest [name]. The patient used to work as a Psychologist, occupational. He has a history of smoking up to 3 packs per day but quit in October of 2014. He has no history of alcohol or drug abuse. HPI  Past Medical History  Diagnosis Date  . CAD (coronary artery disease)     Left Main 30% stenosis, LAD 20 - 30 % stenosis, first and second diagonal branchesat 40 - 50%  stenosis with small arteries, circumflex had 30% stenosis in the large obtuse marginal, RCA at 70 - 80%  stenosis [not felt to be occlusive after evaluation with flow wire], distal 50 - 60%  stenosis - [name][  . COPD (chronic obstructive pulmonary disease)     Dr. Jetty Duhamel  . Depression   . Anxiety   . Hyperlipidemia   . Chronic insomnia   . Gout   . GERD (gastroesophageal reflux disease)   . PVD (peripheral vascular disease)     (ABI 0.9 on right and 0.89 on left)  severe left  external iliac stenosis.  He  had successful stenting of his left external iliac per Dr. Excell Seltzer.  . DDD (degenerative disc disease)   . Hx of colonoscopy   . 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 Cancer     lung  . OSA (obstructive sleep apnea)     NPSG 09/10/10- AHI 11.3/hr  . Hypertension     dr Antoine Poche    Past Surgical History  Procedure Laterality Date  . Spinal fusion  03/05/2007    L4-L5  . Hip surgery      left  . Arm surgery      left  . Shoulder surgery      right  . C-spine surgery    . Angioplasty    . Video bronchoscopy with endobronchial navigation N/A 10/04/2013    Procedure: VIDEO BRONCHOSCOPY WITH ENDOBRONCHIAL NAVIGATION;  Surgeon: Leslye Peer, MD;  Location: [facility] OR;  Service: Thoracic;  Laterality: N/A;    Family History  Problem Relation Age of Onset  . Heart attack Mother   . Heart attack Sister   . Lung cancer Sister   . Emphysema Sister     Social History History  Substance Use Topics  . Smoking status: Former Smoker -- 0.50 packs/day for 43 years    Types: Cigarettes    Quit date: 09/23/2013  . Smokeless tobacco: Never Used     Comment: On Chantix. smokes 2- 3 cigarettes daily  . Alcohol Use: No    No Known Allergies  Current Outpatient Prescriptions  Medication Sig Dispense Refill  . albuterol (PROVENTIL HFA;VENTOLIN HFA) 108 (90 BASE) MCG/ACT inhaler Inhale 2 puffs into the lungs every 4 (four) hours as needed for wheezing or shortness of breath.      [name] albuterol (PROVENTIL) (2.5 MG/3ML) 0.083% nebulizer solution Take 2.5 mg by nebulization every 4 (four) hours as needed for wheezing or shortness of breath.      . ALPRAZolam (XANAX) 0.25 MG tablet Take 1 tablet (0.25 mg total) by mouth 2 (two) times daily as needed for anxiety.  30 tablet  0  . amLODipine (NORVASC) 10 MG tablet Take 10 mg by mouth daily.      . ARIPiprazole (ABILIFY) 5 MG tablet Take 1 tablet (5 mg total) by mouth at bedtime.      [name] atorvastatin (LIPITOR) 20 MG tablet Take  1 tablet (20 mg total) by mouth daily.  90 tablet  3  . carvedilol (COREG) 25 MG tablet Take 1 tablet (25 mg total) by mouth 2 (two) times daily with a meal.  180 tablet  3  . clonazePAM (KLONOPIN) 1 MG tablet Take 1 mg by mouth at bedtime as needed. For anxiety/insomnia      . esomeprazole (NEXIUM) 40 MG capsule Take 1 capsule (40 mg total) by mouth daily before breakfast.  90 capsule  3  . FLUoxetine (PROZAC) 40 MG capsule Take 40 mg by mouth daily.        . Fluticasone-Salmeterol (ADVAIR DISKUS) 250-50 MCG/DOSE AEPB Inhale 1 puff into the lungs 2 (two) times daily.  1 each  3  .  gabapentin (NEURONTIN) 100 MG capsule Take 100 mg by mouth daily.       . Indacaterol Maleate (ARCAPTA NEOHALER) 75 MCG CAPS Place 1 capsule into inhaler and inhale daily.      [name] losartan (COZAAR) 25 MG tablet Take 1 tablet (25 mg total) by mouth daily.  90 tablet  3  . mirtazapine (REMERON) 15 MG tablet Take 1.5 tablets (22.5 mg total) by mouth at bedtime.  90 tablet  1  . modafinil (PROVIGIL) 200 MG tablet Take 200 mg by mouth daily.       [name] morphine (MS CONTIN) 60 MG 12 hr tablet Take 60 mg by mouth 2 (two) times daily.        . Omega-3 Fatty Acids (FISH OIL CONCENTRATE) 1000 MG CAPS Take 1,000 mg by mouth daily.       [name] oxyCODONE-acetaminophen (PERCOCET) 10-325 MG per tablet Take 1 tablet by mouth 3 (three) times daily as needed. For pain      . potassium chloride SA ([name]-DUR,KLOR-CON) 20 MEQ tablet Take 20 mEq by mouth daily.      . varenicline (CHANTIX CONTINUING MONTH PAK) 1 MG tablet Take 1 tablet (1 mg total) by mouth 2 (two) times daily.  60 tablet  5  . VOLTAREN 1 % GEL Apply 2 g topically daily as needed (for pain).        No current facility-administered medications for this visit.    Review of Systems  Constitutional: positive for anorexia and weight loss Eyes: positive for visual disturbance Ears, nose, mouth, throat, and face: negative Respiratory: positive for cough, dyspnea on exertion and  sputum Cardiovascular: negative Gastrointestinal: negative Genitourinary:negative Integument/breast: negative Hematologic/lymphatic: negative Musculoskeletal:negative Neurological: negative Behavioral/Psych: positive for anxiety Endocrine: negative Allergic/Immunologic: negative  Physical Exam  ZOX:WRUEA, healthy, no distress, well nourished, well developed and anxious SKIN: skin color, texture, turgor are normal, no rashes or significant lesions HEAD: Normocephalic, No masses, lesions, tenderness or abnormalities EYES: normal, PERRLA EARS: External ears normal OROPHARYNX:no exudate, no erythema and lips, buccal mucosa, and tongue normal  NECK: supple, no adenopathy, no JVD LYMPH:  no palpable lymphadenopathy, no hepatosplenomegaly LUNGS: Expiratory wheezes on the left upper part of the lung clear to auscultation on the right.  HEART: regular rate & rhythm, no murmurs and no gallops ABDOMEN:abdomen soft, non-tender, normal bowel sounds and no masses or organomegaly BACK: Back symmetric, no curvature., No CVA tenderness EXTREMITIES:no joint deformities, effusion, or inflammation, no edema, no skin discoloration  NEURO: alert & oriented x 3 with fluent speech, no focal motor/sensory deficits  PERFORMANCE STATUS: ECOG 1  LABORATORY DATA: Lab Results  Component Value Date   WBC 10.8* 10/20/2013   HGB 13.5 10/20/2013   HCT 42.6 10/20/2013   MCV 84.9 10/20/2013   PLT 168 10/20/2013      Chemistry      Component Value Date/Time   NA 139 10/20/2013 1109   NA 136 10/08/2013 0958   [name] 3.5 10/20/2013 1109   [name] 3.1* 10/08/2013 0958   CL 100 10/08/2013 0958   CO2 23 10/20/2013 1109   CO2 29 10/08/2013 0958   BUN 8.7 10/20/2013 1109   BUN 10 10/08/2013 0958   CREATININE 0.8 10/20/2013 1109   CREATININE 0.8 10/08/2013 0958      Component Value Date/Time   CALCIUM 9.5 10/20/2013 1109   CALCIUM 9.5 10/08/2013 0958   ALKPHOS 122 10/20/2013 1109   ALKPHOS 141* 10/01/2013 1400    AST 13 10/20/2013 1109   AST  18 10/01/2013 1400   ALT 18 10/20/2013 1109   ALT 19 10/01/2013 1400   BILITOT 0.43 10/20/2013 1109   BILITOT 0.3 10/01/2013 1400       RADIOGRAPHIC STUDIES: Dg Chest 2 View  10/03/2013   CLINICAL DATA:  Shortness of breath and chest tightness. History of smoking.  EXAM: CHEST  2 VIEW  COMPARISON:  Chest radiograph performed 09/16/2013, and CT of the chest performed 09/22/2013  FINDINGS: The patient's large left upper lobe mass is grossly unchanged in appearance. The lungs are otherwise relatively clear. No focal consolidation, pleural effusion or pneumothorax is seen.  The heart remains normal in size. No acute osseus abnormalities are identified. Cervical spinal fusion hardware is noted.  IMPRESSION: Stable appearance to large left upper lobe lung mass. Lungs otherwise grossly clear.   Electronically Signed   By: Roanna Raider M.D.   On: 10/03/2013 23:16   Ct Chest W Contrast  09/22/2013   CLINICAL DATA:  Newly diagnosed lung mass noted on recent chest x-ray.  EXAM: CT CHEST WITH CONTRAST  TECHNIQUE: Multidetector CT imaging of the chest was performed during intravenous contrast administration.  CONTRAST:  80mL OMNIPAQUE IOHEXOL 300 MG/ML  SOLN  COMPARISON:  Chest CT 07/16/2012.  FINDINGS: Mediastinum: Enlarged left hilar lymph node or conglomerate nodal mass measuring up to 1.8 x 3.1 cm. Enlarged AP window lymph node measuring 1.3 cm in short axis. No contralateral hilar or definite paratracheal or subcarinal lymphadenopathy identified at this time. Heart size is normal. There is no significant pericardial fluid, thickening or pericardial calcification. There is atherosclerosis of the thoracic aorta, the great vessels of the mediastinum and the coronary arteries, including calcified atherosclerotic plaque in the left anterior descending, left circumflex and right coronary arteries. Esophagus is unremarkable in appearance. While of the left upper lobe mass abuts the  mediastinum medially (discussed the low) coming in close proximity to the distal aspect of the aortic arch, there does appear to be in a intervening fat plane at this time suggesting against direct mediastinal invasion.  Lungs/Pleura: Large left upper lobe mass measuring 5.7 x 4.7 x 5.0 cm in the medial aspect of the left upper lobe (image 16 of series 3) with surrounding areas of ground-glass attenuation and architectural distortion extending into the apex of the left upper lobe, most compatible with postobstructive pneumonitis. This mass makes contact with the mediastinal pleura over approximtealy 1/4 of its circumference. There are few scattered tiny 2-4 mm nodules throughout the left upper lobe which may simply represent part of the postobstructive changes, or may represent tiny satellite tumor nodules. No other definite suspicious pulmonary nodules are noted at this time. Very mild centrilobular and paraseptal emphysema. No acute consolidative stretched at no pleural effusions.  Upper Abdomen: Incompletely visualized low-attenuation lesion in the posterior aspect of the spleen which measures at least 5.0 x 3.4 cm, similar in retrospect compared to prior studies, favored to represent a benign lesion such as a splenic cyst.  Musculoskeletal: There are no aggressive appearing lytic or blastic lesions noted in the visualized portions of the skeleton.  IMPRESSION: 1. 5.7 x 4.7 x 5.0 cm left upper lobe mass which most likely represents a primary bronchogenic carcinoma. This is associated with some postobstructive changes in the left upper lobe as well as left hilar and AP window lymphadenopathy. Based on these findings, and assuming lack of distant metastatic disease (which will have to be proven by other imaging studies), this is favored to represent at least T2b (  if not T3 because of satellite nodules in the left upper lobe), N2, Mx disease (i.e., at least stage IIIA disease). Further evaluation with PET-CT and  brain MRI with and without IV gadolinium is suggested at this time for complete staging. 2. Mild centrilobular and paraseptal emphysema. 3. Atherosclerosis, including 3 vessel coronary artery disease. Assessment for potential risk factor modification, dietary therapy or pharmacologic therapy may be warranted, if clinically indicated. 4. Additional incidental findings, as above.   Electronically Signed   By: Trudie Reed M.D.   On: 09/22/2013 11:34   Mr [name] OZ Contrast  10/19/2013   CLINICAL DATA:  Lung cancer staging. New onset of headaches 1 month ago.  EXAM: MRI HEAD WITHOUT AND WITH CONTRAST  TECHNIQUE: Multiplanar, multiecho pulse sequences of the brain and surrounding structures were obtained without and with intravenous contrast.  CONTRAST:  18mL MULTIHANCE GADOBENATE DIMEGLUMINE 529 MG/ML IV SOLN  COMPARISON:  None contrast head CT 03/30/2009  FINDINGS: A few punctate foci of T2 hyperintensity are present in the cerebral white matter bilaterally, nonspecific but may reflect minimal chronic small vessel ischemic disease. There is no evidence of acute infarct, mass, midline shift, intracranial hemorrhage, or extra-axial fluid collection. There is no abnormal enhancement.  A normal distal right vertebral artery flow void is not identified. 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 Other major intracranial flow voids are unremarkable. Orbits are normal. Mild anterior left ethmoid and bilateral maxillary sinus mucosal thickening are noted. Calvarium is normal in signal.  IMPRESSION: No evidence of intracranial metastasis.   Electronically Signed   By: Sebastian Ache   On: 10/19/2013 13:22   Nm Pet Image Initial (pi) Skull Base To Thigh  10/19/2013   CLINICAL DATA:  Initial treatment strategy for lung carcinoma.  EXAM: NUCLEAR MEDICINE PET SKULL BASE TO THIGH  FASTING BLOOD GLUCOSE:  Value: 94mg /dl  TECHNIQUE: 30.8 mCi M-57 FDG was injected  intravenously. CT data was obtained and used for attenuation correction and anatomic localization only. (This was not acquired as a diagnostic CT examination.) Additional exam technical data entered on technologist worksheet.  COMPARISON:  CT thorax 09/22/2013,  FINDINGS: NECK  No hypermetabolic lymph nodes in the neck. Asymmetric vocal cord activity with right greater than left suggests paralysis of the left.  CHEST  Hypermetabolic left suprahilar mass measures 4.4 x 5.3 cm with SUV max = 29.1. There is a hypermetabolic prevascular node measuring 16 mm (image 76). This extends into the AP window and likely involves the left recurrent laryngeal nerve with paralysis of left vocal cord. Additional hypermetabolic prevascular node on image 77). There are no hypermetabolic contralateral lymph nodes or supraclavicular lymph nodes.  ABDOMEN/PELVIS  Low-density lesion within the spleen does not have associated metabolic activity. No hypermetabolic abdominal pelvic lymph nodes. No abnormal metabolic activity within the solid organs.  SKELETON  No focal hypermetabolic activity to suggest skeletal metastasis.  IMPRESSION: 1. Hypermetabolic left suprahilar mass abuts the of pleural surface of the mediastinum. 2. 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 Recommend correlation with hoarseness. 3. No contralateral nodal metastasis, supraclavicular nodal metastasis, or distant metastasis. . Staging by FDG PET imaging is T3 N2 M0.   Electronically Signed   By: Genevive Bi M.D.   On: 10/19/2013 11:42   Dg Chest Port 1 View  10/04/2013   CLINICAL DATA:  Status post bronch.  EXAM:  PORTABLE CHEST - 1 VIEW  COMPARISON:  Chest radiograph 10/03/2013; chest CT 09/22/2013.  FINDINGS: No significant interval change in size of large left upper lobe mass. Otherwise stable cardiac and mediastinal contours given patient rotation. Minimal heterogeneous  opacities within the bilateral lower lobes likely represent atelectasis. No definite pleural effusion or pneumothorax. Regional skeleton is unremarkable.  IMPRESSION: Stable large left upper lobe mass. No acute cardiopulmonary process.   Electronically Signed   By: Annia Belt M.D.   On: 10/04/2013 12:37   Dg C-arm Bronchoscopy  10/04/2013   CLINICAL DATA: left upper lobe lung mass   C-ARM BRONCHOSCOPY  Fluoroscopy was utilized by the requesting physician.  No radiographic  interpretation.     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   PLAN: I have a lengthy discussion with the patient and his wife today about his current disease stage, prognosis and treatment options. I recommended for the patient treatment with a course of concurrent chemoradiation with weekly carboplatin for AUC of 2 and paclitaxel 45 mg/M2 for a total of 6-7 weeks according to the final dose of radiation. I discussed with the patient adverse effect of the chemotherapy including but not limited to alopecia, myelosuppression, nausea and vomiting, liver or renal dysfunction. After completion of the induction course of concurrent chemoradiation, I expect the patient to receive 3 more cycles of consolidation chemotherapy depending on the staging scan after concurrent chemoradiation. I will arrange for the patient to have an appointment with radiation oncology early next week for evaluation and discussion of the radiotherapy option of his treatment. The patient would like to proceed with treatment as soon as possible. He is expected to start the first cycle of his concurrent chemoradiation on 11/01/2013. He would have a chemotherapy education class before starting the first cycle of treatment. For anxiety, the  patient will continue on his current treatment with Xanax. I will call his pharmacy with prescription for Compazine 10 mg by mouth every 6 hours as needed for nausea.  The patient voices understanding of current disease status and treatment options and is in agreement with the current care plan.  All questions were answered. The patient knows to call the clinic with any problems, questions or concerns. We can certainly see the patient much sooner if necessary.  Thank you so much for allowing me to participate in the care of Ernest [name]. I will continue to follow up the patient with you and assist in his care.  I spent 55 minutes counseling the patient face to face. The total time spent in the appointment was 70 minutes.  [name]. 10/20/2013, 12:33 PM

## 2013-10-20 NOTE — Telephone Encounter (Signed)
gv and printed appt sched and avs for pt for DEC...sed added tx.Marland KitchenMarland KitchenClydie Braun to call me and pt with d/t for appt.

## 2013-10-20 NOTE — Progress Notes (Signed)
Checked in new pt with no financial concerns. °

## 2013-10-20 NOTE — Telephone Encounter (Signed)
gv and pritned appt sched and avs for pt for DEC....sed added tx. °

## 2013-10-21 MED ORDER — PROCHLORPERAZINE MALEATE 10 MG PO TABS: 10.0000 mg | ORAL_TABLET | Freq: Four times a day (QID) | ORAL | Status: DC | PRN

## 2013-10-21 NOTE — Patient Instructions (Signed)
You are recently diagnosed with a stage IIIA non-small cell lung cancer. We discussed your treatment options including concurrent chemoradiation with weekly carboplatin and paclitaxel. First is expected on 11/01/2013.

## 2013-10-22 ENCOUNTER — Ambulatory Visit (INDEPENDENT_AMBULATORY_CARE_PROVIDER_SITE_OTHER): Payer: Medicare PPO | Admitting: Family Medicine

## 2013-10-22 ENCOUNTER — Telehealth: Payer: Self-pay | Admitting: Internal Medicine

## 2013-10-22 VITALS — Temp 98.3°F | Wt 182.0 lb

## 2013-10-22 DIAGNOSIS — J329 Chronic sinusitis, unspecified: Secondary | ICD-10-CM

## 2013-10-22 MED ORDER — DOXYCYCLINE HYCLATE 100 MG PO TABS: 100.0000 mg | ORAL_TABLET | Freq: Two times a day (BID) | ORAL | Status: DC

## 2013-10-22 NOTE — Progress Notes (Signed)
Chief Complaint  Patient presents with  . Headache    HPI:  -started: about several weeks ago -symptoms:nasal congestion, white thick, sore throat, cough, sinus pressure and pain maxillary -denies:fever, SOB, NVD, tooth pain, ear pain -has tried: Careers adviser, claratin -sick contacts/travel/risks: denies flu exposure, tick exposure or or Ebola risks -Hx of: sinusitis in the past, did have MRI a few weeks ago and reports sinus issues on this  ROS: See pertinent positives and negatives per HPI.  Past Medical History  Diagnosis Date  . CAD (coronary artery disease)     Left Main 30% stenosis, LAD 20 - 30 % stenosis, first and second diagonal branchesat 40 - 50%  stenosis with small arteries, circumflex had 30% stenosis in the large obtuse marginal, RCA at 70 - 80%  stenosis [not felt to be occlusive after evaluation with flow wire], distal 50 - 60% stenosis - [name][  . COPD (chronic obstructive pulmonary disease)     Dr. Jetty Duhamel  . Depression   . Anxiety   . Hyperlipidemia   . Chronic insomnia   . Gout   . GERD (gastroesophageal reflux disease)   . PVD (peripheral vascular disease)     (ABI 0.9 on right and 0.89 on left)  severe left external iliac stenosis.  He  had successful stenting of his left external iliac per Dr. Excell Seltzer.  . DDD (degenerative disc disease)   . Hx of colonoscopy   . 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 Cancer     lung  . OSA (obstructive sleep apnea)     NPSG 09/10/10- [facility] 11.3/hr  . Hypertension     dr Antoine Poche    Past Surgical History  Procedure Laterality Date  . Spinal fusion  03/05/2007    L4-L5  . Hip surgery      left  . Arm surgery      left  . Shoulder surgery      right  . C-spine surgery    . Angioplasty    . Video bronchoscopy with endobronchial navigation N/A 10/04/2013    Procedure: VIDEO BRONCHOSCOPY WITH ENDOBRONCHIAL NAVIGATION;  Surgeon: Leslye Peer, MD;  Location: [facility] OR;  Service: Thoracic;  Laterality: N/A;     Family History  Problem Relation Age of Onset  . Heart attack Mother   . Heart attack Sister   . Lung cancer Sister   . Emphysema Sister     History   Social History  . Marital Status: Married    Spouse Name: N/A    Number of Children: N/A  . Years of Education: N/A   Occupational History  . Disabled welder    Social History Main Topics  . Smoking status: Former Smoker -- 0.50 packs/day for 43 years    Types: Cigarettes    Quit date: 09/23/2013  . Smokeless tobacco: Never Used     Comment: On Chantix. smokes 2- 3 cigarettes daily  . Alcohol Use: No  . Drug Use: No  . Sexual Activity: No   Other Topics Concern  . Not on file   Social History Narrative  . No narrative on file    Current outpatient prescriptions:albuterol (PROVENTIL HFA;VENTOLIN HFA) 108 (90 BASE) MCG/ACT inhaler, Inhale 2 puffs into the lungs every 4 (four) hours as needed for wheezing or shortness of breath., Disp: , Rfl: ;  albuterol (PROVENTIL) (2.5 MG/3ML) 0.083% nebulizer solution, Take 2.5 mg by nebulization every 4 (four) hours as needed for wheezing or shortness of breath.,  Disp: , Rfl:  ALPRAZolam (XANAX) 0.25 MG tablet, Take 1 tablet (0.25 mg total) by mouth 2 (two) times daily as needed for anxiety., Disp: 30 tablet, Rfl: 0;  amLODipine (NORVASC) 10 MG tablet, Take 10 mg by mouth daily., Disp: , Rfl: ;  ARIPiprazole (ABILIFY) 5 MG tablet, Take 1 tablet (5 mg total) by mouth at bedtime., Disp: , Rfl: ;  atorvastatin (LIPITOR) 20 MG tablet, Take 1 tablet (20 mg total) by mouth daily., Disp: 90 tablet, Rfl: 3 carvedilol (COREG) 25 MG tablet, Take 1 tablet (25 mg total) by mouth 2 (two) times daily with a meal., Disp: 180 tablet, Rfl: 3;  clonazePAM (KLONOPIN) 1 MG tablet, Take 1 mg by mouth at bedtime as needed. For anxiety/insomnia, Disp: , Rfl: ;  doxycycline (VIBRA-TABS) 100 MG tablet, Take 1 tablet (100 mg total) by mouth 2 (two) times daily., Disp: 20 tablet, Rfl: 0 esomeprazole (NEXIUM) 40 MG  capsule, Take 1 capsule (40 mg total) by mouth daily before breakfast., Disp: 90 capsule, Rfl: 3;  FLUoxetine (PROZAC) 40 MG capsule, Take 40 mg by mouth daily.  , Disp: , Rfl: ;  Fluticasone-Salmeterol (ADVAIR DISKUS) 250-50 MCG/DOSE AEPB, Inhale 1 puff into the lungs 2 (two) times daily., Disp: 1 each, Rfl: 3;  gabapentin (NEURONTIN) 100 MG capsule, Take 100 mg by mouth daily. , Disp: , Rfl:  Indacaterol Maleate (ARCAPTA NEOHALER) 75 MCG CAPS, Place 1 capsule into inhaler and inhale daily., Disp: , Rfl: ;  losartan (COZAAR) 25 MG tablet, Take 1 tablet (25 mg total) by mouth daily., Disp: 90 tablet, Rfl: 3;  mirtazapine (REMERON) 15 MG tablet, Take 1.5 tablets (22.5 mg total) by mouth at bedtime., Disp: 90 tablet, Rfl: 1;  modafinil (PROVIGIL) 200 MG tablet, Take 200 mg by mouth daily. , Disp: , Rfl:  morphine (MS CONTIN) 60 MG 12 hr tablet, Take 60 mg by mouth 2 (two) times daily.  , Disp: , Rfl: ;  Omega-3 Fatty Acids (FISH OIL CONCENTRATE) 1000 MG CAPS, Take 1,000 mg by mouth daily. , Disp: , Rfl: ;  oxyCODONE-acetaminophen (PERCOCET) 10-325 MG per tablet, Take 1 tablet by mouth 3 (three) times daily as needed. For pain, Disp: , Rfl: ;  potassium chloride SA (K-DUR,KLOR-CON) 20 MEQ tablet, Take 20 mEq by mouth daily., Disp: , Rfl:  prochlorperazine (COMPAZINE) 10 MG tablet, Take 1 tablet (10 mg total) by mouth every 6 (six) hours as needed for nausea or vomiting., Disp: 60 tablet, Rfl: 0;  varenicline (CHANTIX CONTINUING MONTH PAK) 1 MG tablet, Take 1 tablet (1 mg total) by mouth 2 (two) times daily., Disp: 60 tablet, Rfl: 5;  VOLTAREN 1 % GEL, Apply 2 g topically daily as needed (for pain). , Disp: , Rfl:   EXAM:  Filed Vitals:   10/22/13 1512  Temp: 98.3 F (36.8 C)    Body mass index is 30.29 kg/(m^2).  GENERAL: vitals reviewed and listed above, alert, oriented, appears well hydrated and in no acute distress  HEENT: atraumatic, conjunttiva clear, no obvious abnormalities on inspection of  external nose and ears, normal appearance of ear canals and TMs, white nasal congestion L>R, mild post oropharyngeal erythema with PND, no tonsillar edema or exudate, no sinus TTP  NECK: no obvious masses on inspection  LUNGS: clear to auscultation bilaterally, no wheezes, rales or rhonchi, good air movement  CV: HRRR, no peripheral edema  MS: moves all extremities without noticeable abnormality  PSYCH: pleasant and cooperative, no obvious depression or anxiety  ASSESSMENT AND  PLAN:  Discussed the following assessment and plan:  Sinusitis - Plan: doxycycline (VIBRA-TABS) 100 MG tablet  -discussed treatment and risks and return precautions  -of course, we advised to return or notify a doctor immediately if symptoms worsen or persist or new concerns arise.    Patient Instructions  -As we discussed, we have prescribed a new medication for you at this appointment. We discussed the common and serious potential adverse effects of this medication and you can review these and more with the pharmacist when you pick up your medication.  Please follow the instructions for use carefully and notify us immediately if you have any problems taking this medication.  INSTRUCTIONS FOR UPPER RESPIRATORY INFECTION:  -plenty of rest and fluids  -nasal saline wash 2-3 times daily (use prepackaged nasal saline or bottled/distilled water if making your own)   -can use sinex nasal spray for drainage and nasal congestion - but do NOT use longer then 3-4 days  -can use tylenol or ibuprofen as directed for aches and sorethroat  -in the winter time, using a humidifier at night is helpful (please follow cleaning instructions)  -if you are taking a cough medication - use only as directed, may also try a teaspoon of honey to coat the throat and throat lozenges  -for sore throat, salt water gargles can help  -follow up if you have fevers, facial pain, tooth pain, difficulty breathing or are worsening or not  getting better in 5-7 days      Ernest [name] R.

## 2013-10-22 NOTE — Patient Instructions (Signed)
-  As we discussed, we have prescribed a new medication for you at this appointment. We discussed the common and serious potential adverse effects of this medication and you can review these and more with the pharmacist when you pick up your medication.  Please follow the instructions for use carefully and notify us immediately if you have any problems taking this medication.  INSTRUCTIONS FOR UPPER RESPIRATORY INFECTION:  -plenty of rest and fluids  -nasal saline wash 2-3 times daily (use prepackaged nasal saline or bottled/distilled water if making your own)   -can use sinex nasal spray for drainage and nasal congestion - but do NOT use longer then 3-4 days  -can use tylenol or ibuprofen as directed for aches and sorethroat  -in the winter time, using a humidifier at night is helpful (please follow cleaning instructions)  -if you are taking a cough medication - use only as directed, may also try a teaspoon of honey to coat the throat and throat lozenges  -for sore throat, salt water gargles can help  -follow up if you have fevers, facial pain, tooth pain, difficulty breathing or are worsening or not getting better in 5-7 days

## 2013-10-22 NOTE — Progress Notes (Signed)
Pre visit review using our clinic review tool, if applicable. No additional management support is needed unless otherwise documented below in the visit note. 

## 2013-10-22 NOTE — Telephone Encounter (Signed)
Patient Information:  Caller Name: Curtez  Phone: [phone]  Patient: Ernest [name]  Gender: Male  DOB: May 14, 1956  Age: 57 Years  PCP: Artist Pais Doe-Hyun Molly Maduro) (Adults only)  Office Follow Up:  Does the office need to follow up with this patient?: No  Instructions For The Office: N/A  RN Note:  Patient states he has had a headache for approx. 6 weeks. Patient states he was seen in the office for above. Patient states he had an MRI of the head on 10/07/13. States MRI revealed some sinus problems. Patient states he continues to have a headache located behind both eyes, radiating to the top of his head. Patient states he has discomfort located over cheeks and around eyes and forehead. Patient states he has white nasal discharge. Care advice given per guidelines. Call back parameters reviewed. Patient verbalizes understanding.  Symptoms  Reason For Call & Symptoms: Headache  Reviewed Health History In EMR: Yes  Reviewed Medications In EMR: Yes  Reviewed Allergies In EMR: Yes  Reviewed Surgeries / Procedures: Yes  Date of Onset of Symptoms: 09/10/2013  Treatments Tried: Allegra, Claritin, Percocet  Treatments Tried Worked: No  Guideline(s) Used:  Headache  Sinus Pain and Congestion  Disposition Per Guideline:   See Today or Tomorrow in Office  Reason For Disposition Reached:   Sinus congestion (pressure, fullness) present > 10 days  Advice Given:  Call Back If:  You become worse.  For a Stuffy Nose - Use Nasal Washes:  Introduction: Saline (salt water) nasal irrigation (nasal wash) is an effective and simple home remedy for treating stuffy nose and sinus congestion. The nose can be irrigated by pouring, spraying, or squirting salt water into the nose and then letting it run back out.  Hydration:  Drink plenty of liquids (6-8 glasses of water daily). If the air in your home is dry, use a cool mist humidifier  Call Back If:   You become worse.  Patient Will Follow  Care Advice:  YES  Appointment Scheduled:  10/22/2013 15:00:00 Appointment Scheduled Provider:  Selena Batten (TEXT 1st, after 20 mins can call), [facility]ia Client [facility])

## 2013-10-25 ENCOUNTER — Other Ambulatory Visit: Payer: Commercial Managed Care - HMO

## 2013-10-26 ENCOUNTER — Other Ambulatory Visit: Payer: Medicare PPO

## 2013-11-01 ENCOUNTER — Other Ambulatory Visit (HOSPITAL_BASED_OUTPATIENT_CLINIC_OR_DEPARTMENT_OTHER): Payer: Medicare PPO | Admitting: Lab

## 2013-11-01 ENCOUNTER — Ambulatory Visit (HOSPITAL_BASED_OUTPATIENT_CLINIC_OR_DEPARTMENT_OTHER): Payer: Commercial Managed Care - HMO

## 2013-11-01 ENCOUNTER — Encounter: Payer: Self-pay | Admitting: Radiation Oncology

## 2013-11-01 VITALS — BP 133/63 | HR 67 | Temp 98.4°F | Resp 18

## 2013-11-01 DIAGNOSIS — C3490 Malignant neoplasm of unspecified part of unspecified bronchus or lung: Secondary | ICD-10-CM | POA: Insufficient documentation

## 2013-11-01 DIAGNOSIS — C3410 Malignant neoplasm of upper lobe, unspecified bronchus or lung: Secondary | ICD-10-CM

## 2013-11-01 DIAGNOSIS — Z5111 Encounter for antineoplastic chemotherapy: Secondary | ICD-10-CM

## 2013-11-01 DIAGNOSIS — C3492 Malignant neoplasm of unspecified part of left bronchus or lung: Secondary | ICD-10-CM

## 2013-11-01 LAB — COMPREHENSIVE METABOLIC PANEL (CC13)
AST: 15 U/L (ref 5–34)
Alkaline Phosphatase: 132 U/L (ref 40–150)
BUN: 9 mg/dL (ref 7.0–26.0)
Calcium: 9.9 mg/dL (ref 8.4–10.4)
Chloride: 101 mEq/L (ref 98–109)
Creatinine: 0.8 mg/dL (ref 0.7–1.3)
Glucose: 220 mg/dl — ABNORMAL HIGH (ref 70–140)
Total Bilirubin: 0.47 mg/dL (ref 0.20–1.20)
Total Protein: 7.1 g/dL (ref 6.4–8.3)

## 2013-11-01 LAB — CBC WITH DIFFERENTIAL/PLATELET
BASO%: 0.1 % (ref 0.0–2.0)
EOS%: 1.2 % (ref 0.0–7.0)
HCT: 44.7 % (ref 38.4–49.9)
HGB: 14.7 g/dL (ref 13.0–17.1)
LYMPH%: 9.6 % — ABNORMAL LOW (ref 14.0–49.0)
MCH: 28.1 pg (ref 27.2–33.4)
MCHC: 32.9 g/dL (ref 32.0–36.0)
MCV: 85.3 fL (ref 79.3–98.0)
MONO#: 0.8 10*3/uL (ref 0.1–0.9)
MONO%: 6 % (ref 0.0–14.0)
NEUT%: 83.1 % — ABNORMAL HIGH (ref 39.0–75.0)
Platelets: 178 10*3/uL (ref 140–400)
WBC: 13.4 10*3/uL — ABNORMAL HIGH (ref 4.0–10.3)

## 2013-11-01 MED ORDER — ONDANSETRON 16 MG/50ML IVPB (CHCC)
INTRAVENOUS | Status: AC
  Filled 2013-11-01: qty 16

## 2013-11-01 MED ORDER — ONDANSETRON 16 MG/50ML IVPB (CHCC)
16.0000 mg | Freq: Once | INTRAVENOUS | Status: AC
  Administered 2013-11-01: 16 mg via INTRAVENOUS

## 2013-11-01 MED ORDER — DEXAMETHASONE SODIUM PHOSPHATE 20 MG/5ML IJ SOLN
20.0000 mg | Freq: Once | INTRAMUSCULAR | Status: AC
  Administered 2013-11-01: 20 mg via INTRAVENOUS

## 2013-11-01 MED ORDER — SODIUM CHLORIDE 0.9 % IV SOLN
45.0000 mg/m2 | Freq: Once | INTRAVENOUS | Status: AC
  Administered 2013-11-01: 90 mg via INTRAVENOUS
  Filled 2013-11-01: qty 15

## 2013-11-01 MED ORDER — SODIUM CHLORIDE 0.9 % IV SOLN
Freq: Once | INTRAVENOUS | Status: AC
  Administered 2013-11-01: 10:00:00 via INTRAVENOUS

## 2013-11-01 MED ORDER — FAMOTIDINE IN NACL 20-0.9 MG/50ML-% IV SOLN
INTRAVENOUS | Status: AC
  Filled 2013-11-01: qty 50

## 2013-11-01 MED ORDER — DEXAMETHASONE SODIUM PHOSPHATE 20 MG/5ML IJ SOLN
INTRAMUSCULAR | Status: AC
  Filled 2013-11-01: qty 5

## 2013-11-01 MED ORDER — FAMOTIDINE IN NACL 20-0.9 MG/50ML-% IV SOLN
20.0000 mg | Freq: Once | INTRAVENOUS | Status: AC
  Administered 2013-11-01: 20 mg via INTRAVENOUS

## 2013-11-01 MED ORDER — DIPHENHYDRAMINE HCL 50 MG/ML IJ SOLN
INTRAMUSCULAR | Status: AC
  Filled 2013-11-01: qty 1

## 2013-11-01 MED ORDER — DIPHENHYDRAMINE HCL 50 MG/ML IJ SOLN
50.0000 mg | Freq: Once | INTRAMUSCULAR | Status: AC
  Administered 2013-11-01: 50 mg via INTRAVENOUS

## 2013-11-01 MED ORDER — SODIUM CHLORIDE 0.9 % IV SOLN
284.0000 mg | Freq: Once | INTRAVENOUS | Status: AC
  Administered 2013-11-01: 280 mg via INTRAVENOUS
  Filled 2013-11-01: qty 28

## 2013-11-01 NOTE — Progress Notes (Signed)
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 BRONCHIAL BRUSHING NAVIGATION #4, BRUSHING #2, LEFT UPPER LOBE LUNG MASS, (SPECIMEN 4 OF 4, COLLECTED ON 10/04/13): MALIGNANT CELLS CONSISTENT WITH SQUAMOUS CELL CARCINOMA. Diagnosis WANG NEEDLE, FINE NEEDLE ASPIRATION NAVIGATION #3, LEFT UPPER LOBE LUNG MASS #2 (SPECIMEN 3 OF 4, COLLECTED ON 10/04/2013): MALIGNANT CELLS CONSISTENT WITH SQUAMOUS CELL CARCINOMA. TRANSBRONCHIAL NEEDLE ASPIRATION NAVIGATION #2, BRUSHING, LEFT UPPER LOBE LUNG MASS (SPECIMEN 2 OF 4, COLLECTED ON 10/04/2013): MALIGNANT CELLS CONSISTENT WITH SQUAMOUS CELL CARCINOMA.  Tobacco/Marijuana/Snuff/ETOH use: history of up to 3 PPD, currently on Chantix, not smoking at all now. No alcohol use.  Past/Anticipated interventions by cardiothoracic surgery, if any: biopsies  Past/Anticipated interventions by medical oncology, if any: concurrent chemoradiation, 1st cycle on 11/01/13  Signs/Symptoms  Weight changes, if any: lost 22 lbs in past 4 weeks, loss of appetite  Respiratory complaints, if any: SOB x 40 years, worsening over past 6 mos, prod cough w/clear to white sputum, currently SOB w/any activity  Hemoptysis, if any: none  Pain issues, if any:  "heaviness in upper chest", 3/10 on pain scale, worse at night, Percocet prn  SAFETY ISSUES:  Prior radiation? no  Pacemaker/ICD? no  Possible current pregnancy? na  Is the patient on methotrexate? no  Current Complaints / other details:  Married, was Psychologist, occupational, on disability, no children, had 1 son-deceased, loss of appetite, sister died of lung cancer age 16

## 2013-11-01 NOTE — Patient Instructions (Signed)
[name] Discharge Instructions for Patients Receiving Chemotherapy  Today you received the following chemotherapy agents Taxol/Carboplatin To help prevent nausea and vomiting after your treatment, we encourage you to take your nausea medication as prescribed.If you develop nausea and vomiting that is not controlled by your nausea medication, call the clinic.   BELOW ARE SYMPTOMS THAT SHOULD BE REPORTED IMMEDIATELY:  *FEVER GREATER THAN 100.5 F  *CHILLS WITH OR WITHOUT FEVER  NAUSEA AND VOMITING THAT IS NOT CONTROLLED WITH YOUR NAUSEA MEDICATION  *UNUSUAL SHORTNESS OF BREATH  *UNUSUAL BRUISING OR BLEEDING  TENDERNESS IN MOUTH AND THROAT WITH OR WITHOUT PRESENCE OF ULCERS  *URINARY PROBLEMS  *BOWEL PROBLEMS  UNUSUAL RASH Items with * indicate a potential emergency and should be followed up as soon as possible.  Feel free to call the clinic you have any questions or concerns. The clinic phone number is [phone].   Paclitaxel injection (Taxol) What is this medicine? PACLITAXEL (PAK li TAX el) is a chemotherapy drug. It targets fast dividing cells, like cancer cells, and causes these cells to die. This medicine is used to treat ovarian cancer, breast cancer, and other cancers. This medicine may be used for other purposes; ask your health care provider or pharmacist if you have questions. COMMON BRAND NAME(S): Onxol , Taxol What should I tell my health care provider before I take this medicine? They need to know if you have any of these conditions: -blood disorders -irregular heartbeat -infection (especially a virus infection such as chickenpox, cold sores, or herpes) -liver disease -previous or ongoing radiation therapy -an unusual or allergic reaction to paclitaxel, alcohol, polyoxyethylated castor oil, other chemotherapy agents, other medicines, foods, dyes, or preservatives -pregnant or trying to get pregnant -breast-feeding How should I use this  medicine? This drug is given as an infusion into a vein. It is administered in a hospital or clinic by a specially trained health care professional. Talk to your pediatrician regarding the use of this medicine in children. Special care may be needed. Overdosage: If you think you have taken too much of this medicine contact a poison control center or emergency room at once. NOTE: This medicine is only for you. Do not share this medicine with others. What if I miss a dose? It is important not to miss your dose. Call your doctor or health care professional if you are unable to keep an appointment. What may interact with this medicine? Do not take this medicine with any of the following medications: -disulfiram -metronidazole This medicine may also interact with the following medications: -cyclosporine -diazepam -ketoconazole -medicines to increase blood counts like filgrastim, pegfilgrastim, sargramostim -other chemotherapy drugs like cisplatin, doxorubicin, epirubicin, etoposide, teniposide, vincristine -quinidine -testosterone -vaccines -verapamil Talk to your doctor or health care professional before taking any of these medicines: -acetaminophen -aspirin -ibuprofen -ketoprofen -naproxen This list may not describe all possible interactions. Give your health care provider a list of all the medicines, herbs, non-prescription drugs, or dietary supplements you use. Also tell them if you smoke, drink alcohol, or use illegal drugs. Some items may interact with your medicine. What should I watch for while using this medicine? Your condition will be monitored carefully while you are receiving this medicine. You will need important blood work done while you are taking this medicine. This drug may make you feel generally unwell. This is not uncommon, as chemotherapy can affect healthy cells as well as cancer cells. Report any side effects. Continue your course of treatment even though   you feel ill  unless your doctor tells you to stop. In some cases, you may be given additional medicines to help with side effects. Follow all directions for their use. Call your doctor or health care professional for advice if you get a fever, chills or sore throat, or other symptoms of a cold or flu. Do not treat yourself. This drug decreases your body's ability to fight infections. Try to avoid being around people who are sick. This medicine may increase your risk to bruise or bleed. Call your doctor or health care professional if you notice any unusual bleeding. Be careful brushing and flossing your teeth or using a toothpick because you may get an infection or bleed more easily. If you have any dental work done, tell your dentist you are receiving this medicine. Avoid taking products that contain aspirin, acetaminophen, ibuprofen, naproxen, or ketoprofen unless instructed by your doctor. These medicines may hide a fever. Do not become pregnant while taking this medicine. Women should inform their doctor if they wish to become pregnant or think they might be pregnant. There is a potential for serious side effects to an unborn child. Talk to your health care professional or pharmacist for more information. Do not breast-feed an infant while taking this medicine. Men are advised not to father a child while receiving this medicine. What side effects may I notice from receiving this medicine? Side effects that you should report to your doctor or health care professional as soon as possible: -allergic reactions like skin rash, itching or hives, swelling of the face, lips, or tongue -low blood counts - This drug may decrease the number of white blood cells, red blood cells and platelets. You may be at increased risk for infections and bleeding. -signs of infection - fever or chills, cough, sore throat, pain or difficulty passing urine -signs of decreased platelets or bleeding - bruising, pinpoint red spots on the skin,  black, tarry stools, nosebleeds -signs of decreased red blood cells - unusually weak or tired, fainting spells, lightheadedness -breathing problems -chest pain -high or low blood pressure -mouth sores -nausea and vomiting -pain, swelling, redness or irritation at the injection site -pain, tingling, numbness in the hands or feet -slow or irregular heartbeat -swelling of the ankle, feet, hands Side effects that usually do not require medical attention (report to your doctor or health care professional if they continue or are bothersome): -bone pain -complete hair loss including hair on your head, underarms, pubic hair, eyebrows, and eyelashes -changes in the color of fingernails -diarrhea -loosening of the fingernails -loss of appetite -muscle or joint pain -red flush to skin -sweating This list may not describe all possible side effects. Call your doctor for medical advice about side effects. You may report side effects to FDA at 1-800-FDA-1088. Where should I keep my medicine? This drug is given in a hospital or clinic and will not be stored at home. NOTE: This sheet is a summary. It may not cover all possible information. If you have questions about this medicine, talk to your doctor, pharmacist, or health care provider.  2014, Elsevier/Gold Standard. (2013-01-04 16:41:21)   Carboplatin injection What is this medicine? CARBOPLATIN (KAR boe pla tin) is a chemotherapy drug. It targets fast dividing cells, like cancer cells, and causes these cells to die. This medicine is used to treat ovarian cancer and many other cancers. This medicine may be used for other purposes; ask your health care provider or pharmacist if you have questions. COMMON BRAND NAME(S):   Paraplatin What should I tell my health care provider before I take this medicine? They need to know if you have any of these conditions: -blood disorders -hearing problems -kidney disease -recent or ongoing radiation  therapy -an unusual or allergic reaction to carboplatin, cisplatin, other chemotherapy, other medicines, foods, dyes, or preservatives -pregnant or trying to get pregnant -breast-feeding How should I use this medicine? This drug is usually given as an infusion into a vein. It is administered in a hospital or clinic by a specially trained health care professional. Talk to your pediatrician regarding the use of this medicine in children. Special care may be needed. Overdosage: If you think you have taken too much of this medicine contact a poison control center or emergency room at once. NOTE: This medicine is only for you. Do not share this medicine with others. What if I miss a dose? It is important not to miss a dose. Call your doctor or health care professional if you are unable to keep an appointment. What may interact with this medicine? -medicines for seizures -medicines to increase blood counts like filgrastim, pegfilgrastim, sargramostim -some antibiotics like amikacin, gentamicin, neomycin, streptomycin, tobramycin -vaccines Talk to your doctor or health care professional before taking any of these medicines: -acetaminophen -aspirin -ibuprofen -ketoprofen -naproxen This list may not describe all possible interactions. Give your health care provider a list of all the medicines, herbs, non-prescription drugs, or dietary supplements you use. Also tell them if you smoke, drink alcohol, or use illegal drugs. Some items may interact with your medicine. What should I watch for while using this medicine? Your condition will be monitored carefully while you are receiving this medicine. You will need important blood work done while you are taking this medicine. This drug may make you feel generally unwell. This is not uncommon, as chemotherapy can affect healthy cells as well as cancer cells. Report any side effects. Continue your course of treatment even though you feel ill unless your doctor  tells you to stop. In some cases, you may be given additional medicines to help with side effects. Follow all directions for their use. Call your doctor or health care professional for advice if you get a fever, chills or sore throat, or other symptoms of a cold or flu. Do not treat yourself. This drug decreases your body's ability to fight infections. Try to avoid being around people who are sick. This medicine may increase your risk to bruise or bleed. Call your doctor or health care professional if you notice any unusual bleeding. Be careful brushing and flossing your teeth or using a toothpick because you may get an infection or bleed more easily. If you have any dental work done, tell your dentist you are receiving this medicine. Avoid taking products that contain aspirin, acetaminophen, ibuprofen, naproxen, or ketoprofen unless instructed by your doctor. These medicines may hide a fever. Do not become pregnant while taking this medicine. Women should inform their doctor if they wish to become pregnant or think they might be pregnant. There is a potential for serious side effects to an unborn child. Talk to your health care professional or pharmacist for more information. Do not breast-feed an infant while taking this medicine. What side effects may I notice from receiving this medicine? Side effects that you should report to your doctor or health care professional as soon as possible: -allergic reactions like skin rash, itching or hives, swelling of the face, lips, or tongue -signs of infection - fever or chills,   cough, sore throat, pain or difficulty passing urine -signs of decreased platelets or bleeding - bruising, pinpoint red spots on the skin, black, tarry stools, nosebleeds -signs of decreased red blood cells - unusually weak or tired, fainting spells, lightheadedness -breathing problems -changes in hearing -changes in vision -chest pain -high blood pressure -low blood counts - This  drug may decrease the number of white blood cells, red blood cells and platelets. You may be at increased risk for infections and bleeding. -nausea and vomiting -pain, swelling, redness or irritation at the injection site -pain, tingling, numbness in the hands or feet -problems with balance, talking, walking -trouble passing urine or change in the amount of urine Side effects that usually do not require medical attention (report to your doctor or health care professional if they continue or are bothersome): -hair loss -loss of appetite -metallic taste in the mouth or changes in taste This list may not describe all possible side effects. Call your doctor for medical advice about side effects. You may report side effects to FDA at 1-800-FDA-1088. Where should I keep my medicine? This drug is given in a hospital or clinic and will not be stored at home. NOTE: This sheet is a summary. It may not cover all possible information. If you have questions about this medicine, talk to your doctor, pharmacist, or health care provider.  2014, Elsevier/Gold Standard. (2008-02-16 14:38:05)  

## 2013-11-02 ENCOUNTER — Ambulatory Visit (INDEPENDENT_AMBULATORY_CARE_PROVIDER_SITE_OTHER): Payer: Medicare PPO | Admitting: Emergency Medicine

## 2013-11-02 ENCOUNTER — Encounter: Payer: Self-pay | Admitting: Radiation Oncology

## 2013-11-02 ENCOUNTER — Telehealth: Payer: Self-pay | Admitting: *Deleted

## 2013-11-02 ENCOUNTER — Ambulatory Visit
Admission: RE | Admit: 2013-11-02 | Discharge: 2013-11-02 | Disposition: A | Discharge status: Home / Self Care | Payer: Medicare PPO | Source: Ambulatory Visit | Attending: Radiation Oncology | Admitting: Radiation Oncology

## 2013-11-02 ENCOUNTER — Encounter: Payer: Self-pay | Admitting: Emergency Medicine

## 2013-11-02 ENCOUNTER — Ambulatory Visit
Admission: RE | Admit: 2013-11-02 | Discharge: 2013-11-02 | Disposition: A | Discharge status: Home / Self Care | Payer: Commercial Managed Care - HMO | Source: Ambulatory Visit | Attending: Radiation Oncology | Admitting: Radiation Oncology

## 2013-11-02 VITALS — BP 140/92 | HR 74 | Ht 67.0 in | Wt 183.8 lb

## 2013-11-02 VITALS — BP 150/64 | HR 78 | Temp 98.3°F | Resp 20 | Ht 65.0 in | Wt 182.0 lb

## 2013-11-02 DIAGNOSIS — R0609 Other forms of dyspnea: Secondary | ICD-10-CM | POA: Diagnosis not present

## 2013-11-02 DIAGNOSIS — R599 Enlarged lymph nodes, unspecified: Secondary | ICD-10-CM | POA: Diagnosis not present

## 2013-11-02 DIAGNOSIS — I2510 Atherosclerotic heart disease of native coronary artery without angina pectoris: Secondary | ICD-10-CM

## 2013-11-02 DIAGNOSIS — E785 Hyperlipidemia, unspecified: Secondary | ICD-10-CM | POA: Insufficient documentation

## 2013-11-02 DIAGNOSIS — F3289 Other specified depressive episodes: Secondary | ICD-10-CM | POA: Insufficient documentation

## 2013-11-02 DIAGNOSIS — Z79899 Other long term (current) drug therapy: Secondary | ICD-10-CM | POA: Insufficient documentation

## 2013-11-02 DIAGNOSIS — C3490 Malignant neoplasm of unspecified part of unspecified bronchus or lung: Secondary | ICD-10-CM

## 2013-11-02 DIAGNOSIS — G4733 Obstructive sleep apnea (adult) (pediatric): Secondary | ICD-10-CM

## 2013-11-02 DIAGNOSIS — J4489 Other specified chronic obstructive pulmonary disease: Secondary | ICD-10-CM | POA: Insufficient documentation

## 2013-11-02 DIAGNOSIS — C3410 Malignant neoplasm of upper lobe, unspecified bronchus or lung: Secondary | ICD-10-CM | POA: Insufficient documentation

## 2013-11-02 DIAGNOSIS — R490 Dysphonia: Secondary | ICD-10-CM | POA: Diagnosis not present

## 2013-11-02 DIAGNOSIS — Z87891 Personal history of nicotine dependence: Secondary | ICD-10-CM | POA: Insufficient documentation

## 2013-11-02 DIAGNOSIS — D696 Thrombocytopenia, unspecified: Secondary | ICD-10-CM | POA: Diagnosis not present

## 2013-11-02 DIAGNOSIS — C3492 Malignant neoplasm of unspecified part of left bronchus or lung: Secondary | ICD-10-CM

## 2013-11-02 DIAGNOSIS — F411 Generalized anxiety disorder: Secondary | ICD-10-CM | POA: Insufficient documentation

## 2013-11-02 DIAGNOSIS — F329 Major depressive disorder, single episode, unspecified: Secondary | ICD-10-CM | POA: Insufficient documentation

## 2013-11-02 DIAGNOSIS — R110 Nausea: Secondary | ICD-10-CM | POA: Insufficient documentation

## 2013-11-02 DIAGNOSIS — Z510 Encounter for antineoplastic radiation therapy: Secondary | ICD-10-CM | POA: Diagnosis present

## 2013-11-02 DIAGNOSIS — J029 Acute pharyngitis, unspecified: Secondary | ICD-10-CM | POA: Diagnosis not present

## 2013-11-02 DIAGNOSIS — R0989 Other specified symptoms and signs involving the circulatory and respiratory systems: Secondary | ICD-10-CM | POA: Insufficient documentation

## 2013-11-02 DIAGNOSIS — J449 Chronic obstructive pulmonary disease, unspecified: Secondary | ICD-10-CM | POA: Insufficient documentation

## 2013-11-02 DIAGNOSIS — K219 Gastro-esophageal reflux disease without esophagitis: Secondary | ICD-10-CM | POA: Insufficient documentation

## 2013-11-02 DIAGNOSIS — I739 Peripheral vascular disease, unspecified: Secondary | ICD-10-CM | POA: Insufficient documentation

## 2013-11-02 DIAGNOSIS — I10 Essential (primary) hypertension: Secondary | ICD-10-CM | POA: Insufficient documentation

## 2013-11-02 HISTORY — DX: Malignant neoplasm of unspecified part of unspecified bronchus or lung: C34.90

## 2013-11-02 MED ORDER — HYDROCODONE-HOMATROPINE 5-1.5 MG/5ML PO SYRP: 5.0000 mL | ORAL_SOLUTION | Freq: Four times a day (QID) | ORAL | Status: DC | PRN

## 2013-11-02 NOTE — Progress Notes (Signed)
Complex simulation/treatment planning note: The patient taken to the CT simulator. He was placed supine on a wing board. His chest was then scanned. The CT data set was sent to the planning system right contoured his primary tumor and adjacent left mediastinal adenopathy. The remaining normal contours including the spinal cord, esophagus, heart, and lungs are contoured as well. He is now ready for 3-D simulation for helical Tomotherapy. I prescribing 6600 cGy to his PTV 66 expanding CTV 66 by 1.0 cm. I am also requesting daily image guidance with MV CT.

## 2013-11-02 NOTE — Progress Notes (Signed)
Please see the Nurse Progress Note in the MD Initial Consult Encounter for this patient. 

## 2013-11-02 NOTE — Telephone Encounter (Signed)
Mild nausea this am resolved with Compazine. No other GI distress. Eating well. Some muscle aches, but mild. Feeling OK. Instructed to call for any questions or problems.

## 2013-11-02 NOTE — Progress Notes (Signed)
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 male who is seen today for the courtesy of Dr. Arbutus Ped for consideration of radiation therapy along with chemotherapy in the management of his T3 N2 squamous cell carcinoma of the left lung. He presented with chest congestion for approximately 3-4 months in addition to headaches. He was seen by Dr. Jetty Duhamel who get a chest x-ray in 09/18/2013 showing a new left suprahilar mass concerning for malignancy. A CT scan on 09/23/2013 showed a 5.7 x 4.7 x 5.0 cm left upper lobe mass. There were some postobstructive changes in the left upper lobe as well in addition to left hilar and AP window lymphadenopathy. Dr. Delton Coombes performed bronchoscopy with a diagnosis of squamous cell carcinoma. There were no endobronchial lesions seen. His PET scan on 10/20/2013 showed hypermetabolic left suprahilar mass abutting the pleural surface of the mediastinum. 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 There were no distant metastases. His MR of the brain was without evidence for metastatic disease. He was seen by Dr. Arbutus Ped who started chemotherapy yesterday. He states that he's lost approximately 20 pounds over the past 3 months. His appetite is only fair. He does have significant medical comorbidities including coronary artery disease, peripheral vascular disease, and COPD.  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     PAST SURGICAL HISTORY:  Past Surgical History  Procedure Laterality Date  . Spinal fusion  03/05/2007    L4-L5  . Hip surgery      left  . Arm surgery      left  . Shoulder surgery      right  . C-spine surgery    . Angioplasty    . Video bronchoscopy with endobronchial navigation N/A 10/04/2013    Procedure: VIDEO BRONCHOSCOPY WITH ENDOBRONCHIAL NAVIGATION;  Surgeon: Leslye Peer, MD;  Location: [facility] OR;  Service: Thoracic;  Laterality: N/A;  . Back surgery       FAMILY HISTORY: family history includes Cancer in his sister; Emphysema in his sister; Heart attack in his mother and sister; Hypertension in his brother; Lung cancer in his sister. His sister died of metastatic small cell lung cancer 3 months ago at age 73. His mother died of a heart attack in her early 29s, and his father died of postoperative complications in his early 13s.   SOCIAL HISTORY:  reports that he quit smoking about 5 weeks ago. His smoking use included Cigarettes. He has a 21.5 pack-year smoking history. He has never used smokeless tobacco. He reports that he does not drink alcohol or use illicit drugs. Married, no children. He is on disability from low back  pain. He previously  worked as a Web designer.   ALLERGIES: Review of patient's allergies indicates no known allergies.   MEDICATIONS:  Current Outpatient Prescriptions  Medication Sig Dispense Refill  . albuterol (PROVENTIL HFA;VENTOLIN HFA) 108 (90 BASE) MCG/ACT inhaler Inhale 2 puffs into the lungs every 4 (four) hours as needed for wheezing or shortness of breath.      [name] albuterol (PROVENTIL) (2.5  MG/3ML) 0.083% nebulizer solution Take 2.5 mg by nebulization every 4 (four) hours as needed for wheezing or shortness of breath.      . ALPRAZolam (XANAX) 0.25 MG tablet Take 1 tablet (0.25 mg total) by mouth 2 (two) times daily as needed for anxiety.  30 tablet  0  . amLODipine (NORVASC) 10 MG tablet Take 10 mg by mouth daily.      . ARIPiprazole (ABILIFY) 5 MG tablet Take 1 tablet (5 mg total) by mouth at bedtime.      [name] atorvastatin (LIPITOR) 20 MG tablet Take 1 tablet (20 mg total) by mouth daily.  90 tablet  3  . carvedilol (COREG) 25 MG tablet Take 1 tablet (25 mg total) by mouth 2 (two) times daily with a meal.  180 tablet  3  . clonazePAM (KLONOPIN) 1 MG tablet Take 1 mg by mouth at bedtime as needed. For anxiety/insomnia      . doxycycline (VIBRA-TABS) 100 MG tablet Take 1 tablet (100 mg total) by mouth 2 (two) times daily.  20 tablet  0  . esomeprazole (NEXIUM) 40 MG capsule Take 1 capsule (40 mg total) by mouth daily before breakfast.  90 capsule  3  . FLUoxetine (PROZAC) 40 MG capsule Take 40 mg by mouth daily.        . Fluticasone-Salmeterol (ADVAIR DISKUS) 250-50 MCG/DOSE AEPB Inhale 1 puff into the lungs 2 (two) times daily.  1 each  3  . gabapentin (NEURONTIN) 100 MG capsule Take 100 mg by mouth daily.       . Indacaterol Maleate (ARCAPTA NEOHALER) 75 MCG CAPS Place 1 capsule into inhaler and inhale daily.      [name] losartan (COZAAR) 25 MG tablet Take 1 tablet (25 mg total) by mouth daily.  90 tablet  3  . mirtazapine (REMERON) 15 MG tablet Take 1.5 tablets (22.5 mg total) by mouth at bedtime.  90 tablet  1  . modafinil (PROVIGIL) 200 MG tablet Take 200 mg by mouth daily.       [name] morphine (MS CONTIN) 60 MG 12 hr tablet Take 60 mg by mouth 2 (two) times daily.        . Omega-3 Fatty Acids (FISH OIL CONCENTRATE) 1000 MG CAPS Take 1,000 mg by mouth daily.       [name] oxyCODONE-acetaminophen (PERCOCET) 10-325 MG per tablet Take 1 tablet by mouth 3 (three) times daily as needed. For pain       . potassium chloride SA (K-DUR,KLOR-CON) 20 MEQ tablet Take 20 mEq by mouth daily.      . prochlorperazine (COMPAZINE) 10 MG tablet Take 1 tablet (10 mg total) by mouth every 6 (six) hours as needed for nausea or vomiting.  60 tablet  0  . varenicline (CHANTIX CONTINUING MONTH PAK) 1 MG tablet Take 1 tablet (1 mg total) by mouth 2 (two) times daily.  60 tablet  5  . VOLTAREN 1 % GEL Apply 2 g topically daily as needed (for pain).        No current facility-administered medications for this encounter.  REVIEW OF SYSTEMS:  Pertinent items are noted in HPI.    PHYSICAL EXAM:  height is 5\' 5"  (1.651 m) and weight is 182 lb (82.555 kg). His oral temperature is 98.3 F (36.8 C). His blood pressure is 150/64 and his pulse is 78. His respiration is 20 and oxygen saturation is 99%.   Alert and oriented 57 year old white male appearing his stated age. He is in no acute respiratory distress. Head and neck examination: Grossly unremarkable. Nodes: Without palpable cervical or supraclavicular lymphadenopathy. Chest: Scattered rhonchi throughout both lung zones. Heart: Regular rate and rhythm. Back: Without palpable spinal or CVA discomfort. Abdomen: Without masses organomegaly.   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 His disease appears to be unresectable. Based on the NCCN guidelines, he should be managed by combination chemotherapy/radiation therapy. I plan to deliver  approximately 6600 cGy in 33 sessions along with chemotherapy. We discussed the potential acute and late toxicities of radiation therapy including esophagitis, and late pulmonary fibrosis/esophageal stricture. He understands that he does have limited respiratory reserve and he is at risk for significant worsening of his dyspnea, the need for supplemental oxygen, and even respiratory failure. Consent is signed today   PLAN: He'll undergo simulation/treatment planning today in he'll begin his radiation therapy early next week.  I spent 40 minutes minutes face to face with the patient and more than 50% of that time was spent in counseling and/or coordination of care.

## 2013-11-02 NOTE — Assessment & Plan Note (Signed)
With cough - chemo and XRT initiated at [facility] - will write for hycodan - follow up with Dr Maple Hudson

## 2013-11-02 NOTE — Progress Notes (Signed)
HPI:  57 yo man, followed by Dr Maple Hudson for COPD, chronic bronchitis, OSA on CPAP, CAD, tobacco use, 1/2 pk a day. He had CXR and then CT scan 10/'14 that unfortunately showed LUL mass. He is referred for evaluation for biopsy of the LUL lesion. He is on Arcapta, Advair. He has baseline exertional SOB. No CP, no palpitations. BP is stable.   [name] 11/02/13 -- Hx COPD, chronic bronchitis, OSA on CPAP, CAD, tobacco use. Dx with squamous cell lung CA by [facility] on 10/04/13. Has just started chemo + XRT. He has had some cough, a metal taste in his mouth. Denies any over CP but does have some mid chest pressure.     Filed Vitals:   11/02/13 1507  BP: 140/92  Pulse: 74  Height: 5\' 7"  (1.702 m)  Weight: 183 lb 12.8 oz (83.371 kg)  SpO2: 96%   Gen: Pleasant, well-nourished, in no distress,  normal affect  ENT: No lesions,  mouth clear,  oropharynx clear, no postnasal drip  Neck: No JVD, no TMG, no carotid bruits  Lungs: No use of accessory muscles, coarse on the R, few end exp wheezes best heard L upper field   Cardiovascular: RRR, heart sounds normal, no murmur or gallops, no peripheral edema  Musculoskeletal: No deformities, no cyanosis or clubbing  Neuro: alert, non focal  Skin: Warm, no lesions or rashes    09/23/13 --  COMPARISON: Chest CT 07/16/2012.  FINDINGS:  Mediastinum: Enlarged left hilar lymph node or conglomerate nodal  mass measuring up to 1.8 x 3.1 cm. Enlarged AP window lymph node  measuring 1.3 cm in short axis. No contralateral hilar or definite  paratracheal or subcarinal lymphadenopathy identified at this time.  Heart size is normal. There is no significant pericardial fluid,  thickening or pericardial calcification. There is atherosclerosis of  the thoracic aorta, the great vessels of the mediastinum and the  coronary arteries, including calcified atherosclerotic plaque in the  left anterior descending, left circumflex and right coronary  arteries. Esophagus is  unremarkable in appearance. While of the left  upper lobe mass abuts the mediastinum medially (discussed the low)  coming in close proximity to the distal aspect of the aortic arch,  there does appear to be in a intervening fat plane at this time  suggesting against direct mediastinal invasion.  Lungs/Pleura: Large left upper lobe mass measuring 5.7 x 4.7 x 5.0  cm in the medial aspect of the left upper lobe (image 16 of series  3) with surrounding areas of ground-glass attenuation and  architectural distortion extending into the apex of the left upper  lobe, most compatible with postobstructive pneumonitis. This mass  makes contact with the mediastinal pleura over approximtealy 1/4 of  its circumference. There are few scattered tiny 2-4 mm nodules  throughout the left upper lobe which may simply represent part of  the postobstructive changes, or may represent tiny satellite tumor  nodules. No other definite suspicious pulmonary nodules are noted at  this time. Very mild centrilobular and paraseptal emphysema. No  acute consolidative stretched at no pleural effusions.  Upper Abdomen: Incompletely visualized low-attenuation lesion in the  posterior aspect of the spleen which measures at least 5.0 x 3.4 cm,  similar in retrospect compared to prior studies, favored to  represent a benign lesion such as a splenic cyst.  Musculoskeletal: There are no aggressive appearing lytic or blastic  lesions noted in the visualized portions of the skeleton.  IMPRESSION:  1. 5.7 x 4.7  x 5.0 cm left upper lobe mass which most likely  represents a primary bronchogenic carcinoma. This is associated with  some postobstructive changes in the left upper lobe as well as left  hilar and AP window lymphadenopathy. Based on these findings, and  assuming lack of distant metastatic disease (which will have to be  proven by other imaging studies), this is favored to represent at  least T2b (if not T3 because of  satellite nodules in the left upper  lobe), N2, Mx disease (i.e., at least stage IIIA disease). Further  evaluation with PET-CT and brain MRI with and without IV gadolinium  is suggested at this time for complete staging.  2. Mild centrilobular and paraseptal emphysema.  3. Atherosclerosis, including 3 vessel coronary artery disease.  Assessment for potential risk factor modification, dietary therapy  or pharmacologic therapy may be warranted, if clinically indicated.  4. Additional incidental findings, as above.   OBSTRUCTIVE SLEEP APNEA Continue CPAP  Squamous cell lung cancer With cough - chemo and XRT initiated at [facility] - will write for hycodan - follow up with Dr Maple Hudson  COPD with asthma Continue advair + albuterol   CORONARY ATHEROSCLEROSIS NATIVE CORONARY ARTERY Having chest pressure with radiation to arm. I believe I can explain this given his LUL mass, but do not want to miss a contribution of his CAD.  - will ask him to see Dr Antoine Poche in follow up to assess and risk stratify

## 2013-11-02 NOTE — Assessment & Plan Note (Signed)
Continue CPAP.  

## 2013-11-02 NOTE — Assessment & Plan Note (Signed)
Having chest pressure with radiation to arm. I believe I can explain this given his LUL mass, but do not want to miss a contribution of his CAD.  - will ask him to see Dr Antoine Poche in follow up to assess and risk stratify

## 2013-11-02 NOTE — Assessment & Plan Note (Signed)
Continue advair + albuterol

## 2013-11-02 NOTE — Patient Instructions (Signed)
Please continue Advair Use hycodan as needed for cough We will arrange for you to follow up with Dr Antoine Poche Follow with Dr Maple Hudson regarding your COPD and Sleep Apnea.  Follow with Dr Delton Coombes as needed

## 2013-11-08 ENCOUNTER — Ambulatory Visit (HOSPITAL_BASED_OUTPATIENT_CLINIC_OR_DEPARTMENT_OTHER): Payer: Medicare HMO | Admitting: Internal Medicine

## 2013-11-08 ENCOUNTER — Encounter: Payer: Self-pay | Admitting: Internal Medicine

## 2013-11-08 ENCOUNTER — Other Ambulatory Visit (HOSPITAL_BASED_OUTPATIENT_CLINIC_OR_DEPARTMENT_OTHER): Payer: Commercial Managed Care - HMO

## 2013-11-08 ENCOUNTER — Ambulatory Visit (HOSPITAL_BASED_OUTPATIENT_CLINIC_OR_DEPARTMENT_OTHER): Payer: Commercial Managed Care - HMO

## 2013-11-08 ENCOUNTER — Encounter: Payer: Self-pay | Admitting: Radiation Oncology

## 2013-11-08 ENCOUNTER — Telehealth: Payer: Self-pay | Admitting: Internal Medicine

## 2013-11-08 VITALS — BP 139/60 | HR 75 | Temp 97.1°F | Resp 18 | Ht 67.0 in | Wt 178.1 lb

## 2013-11-08 DIAGNOSIS — C3490 Malignant neoplasm of unspecified part of unspecified bronchus or lung: Secondary | ICD-10-CM

## 2013-11-08 DIAGNOSIS — C772 Secondary and unspecified malignant neoplasm of intra-abdominal lymph nodes: Secondary | ICD-10-CM

## 2013-11-08 DIAGNOSIS — Z510 Encounter for antineoplastic radiation therapy: Secondary | ICD-10-CM | POA: Diagnosis not present

## 2013-11-08 DIAGNOSIS — C3492 Malignant neoplasm of unspecified part of left bronchus or lung: Secondary | ICD-10-CM

## 2013-11-08 DIAGNOSIS — Z5111 Encounter for antineoplastic chemotherapy: Secondary | ICD-10-CM

## 2013-11-08 LAB — CBC WITH DIFFERENTIAL/PLATELET
BASO%: 0.2 % (ref 0.0–2.0)
Eosinophils Absolute: 0.2 10*3/uL (ref 0.0–0.5)
HCT: 40.1 % (ref 38.4–49.9)
LYMPH%: 9.1 % — ABNORMAL LOW (ref 14.0–49.0)
MCH: 27.8 pg (ref 27.2–33.4)
MCHC: 32.9 g/dL (ref 32.0–36.0)
MCV: 84.4 fL (ref 79.3–98.0)
MONO%: 9.7 % (ref 0.0–14.0)
NEUT#: 8.9 10*3/uL — ABNORMAL HIGH (ref 1.5–6.5)
Platelets: 200 10*3/uL (ref 140–400)
RBC: 4.75 10*6/uL (ref 4.20–5.82)
WBC: 11.2 10*3/uL — ABNORMAL HIGH (ref 4.0–10.3)

## 2013-11-08 LAB — BASIC METABOLIC PANEL (CC13)
Anion Gap: 12 mEq/L — ABNORMAL HIGH (ref 3–11)
BUN: 10.6 mg/dL (ref 7.0–26.0)
Potassium: 3.7 mEq/L (ref 3.5–5.1)
Sodium: 137 mEq/L (ref 136–145)

## 2013-11-08 MED ORDER — SODIUM CHLORIDE 0.9 % IV SOLN
Freq: Once | INTRAVENOUS | Status: AC
  Administered 2013-11-08: 12:00:00 via INTRAVENOUS

## 2013-11-08 MED ORDER — DEXAMETHASONE SODIUM PHOSPHATE 20 MG/5ML IJ SOLN
INTRAMUSCULAR | Status: AC
  Filled 2013-11-08: qty 5

## 2013-11-08 MED ORDER — DIPHENHYDRAMINE HCL 50 MG/ML IJ SOLN
50.0000 mg | Freq: Once | INTRAMUSCULAR | Status: AC
  Administered 2013-11-08: 50 mg via INTRAVENOUS

## 2013-11-08 MED ORDER — FAMOTIDINE IN NACL 20-0.9 MG/50ML-% IV SOLN
INTRAVENOUS | Status: AC
  Filled 2013-11-08: qty 50

## 2013-11-08 MED ORDER — FAMOTIDINE IN NACL 20-0.9 MG/50ML-% IV SOLN
20.0000 mg | Freq: Once | INTRAVENOUS | Status: AC
  Administered 2013-11-08: 20 mg via INTRAVENOUS

## 2013-11-08 MED ORDER — ONDANSETRON 16 MG/50ML IVPB (CHCC)
INTRAVENOUS | Status: AC
  Filled 2013-11-08: qty 16

## 2013-11-08 MED ORDER — DEXAMETHASONE SODIUM PHOSPHATE 20 MG/5ML IJ SOLN
20.0000 mg | Freq: Once | INTRAMUSCULAR | Status: AC
  Administered 2013-11-08: 20 mg via INTRAVENOUS

## 2013-11-08 MED ORDER — SODIUM CHLORIDE 0.9 % IV SOLN
284.0000 mg | Freq: Once | INTRAVENOUS | Status: AC
  Administered 2013-11-08: 280 mg via INTRAVENOUS
  Filled 2013-11-08: qty 28

## 2013-11-08 MED ORDER — SODIUM CHLORIDE 0.9 % IV SOLN
45.0000 mg/m2 | Freq: Once | INTRAVENOUS | Status: AC
  Administered 2013-11-08: 90 mg via INTRAVENOUS
  Filled 2013-11-08: qty 15

## 2013-11-08 MED ORDER — DIPHENHYDRAMINE HCL 50 MG/ML IJ SOLN
INTRAMUSCULAR | Status: AC
  Filled 2013-11-08: qty 1

## 2013-11-08 MED ORDER — ONDANSETRON 16 MG/50ML IVPB (CHCC)
16.0000 mg | Freq: Once | INTRAVENOUS | Status: AC
Start: 2013-11-08 — End: 2013-11-08
  Administered 2013-11-08: 16 mg via INTRAVENOUS

## 2013-11-08 NOTE — Progress Notes (Signed)
3-D simulation note:  The patient underwent 3-D simulation for management of his non-small cell carcinoma. He was treated with conformal MLCs with a right lung block. Dose volume histograms were obtained for the target and avoidance structures including the lung and spinal cord. We met our departmental guidelines. Tomotherapy segmentation would perform a liver status treatment to determine the number of conformal custom block equivalence. I prescribing 6600 cGy 33 sessions utilizing helical 6 MV photons. I requested he undergo daily MV CT, setting up to his spine and carina.

## 2013-11-08 NOTE — Patient Instructions (Signed)
[facility] Discharge Instructions for Patients Receiving Chemotherapy  Today you received the following chemotherapy agents: Taxol, Carboplatin  To help prevent nausea and vomiting after your treatment, we encourage you to take your nausea medication as prescribed.    If you develop nausea and vomiting that is not controlled by your nausea medication, call the clinic.   BELOW ARE SYMPTOMS THAT SHOULD BE REPORTED IMMEDIATELY:  *FEVER GREATER THAN 100.5 F  *CHILLS WITH OR WITHOUT FEVER  NAUSEA AND VOMITING THAT IS NOT CONTROLLED WITH YOUR NAUSEA MEDICATION  *UNUSUAL SHORTNESS OF BREATH  *UNUSUAL BRUISING OR BLEEDING  TENDERNESS IN MOUTH AND THROAT WITH OR WITHOUT PRESENCE OF ULCERS  *URINARY PROBLEMS  *BOWEL PROBLEMS  UNUSUAL RASH Items with * indicate a potential emergency and should be followed up as soon as possible.  Feel free to call the clinic you have any questions or concerns. The clinic phone number is [phone].    

## 2013-11-08 NOTE — Progress Notes (Signed)
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  PRIOR THERAPY: None  CURRENT THERAPY: Concurrent chemoradiation with weekly carboplatin for AUC of 2 and paclitaxel 45 mg/M2, status post 1 cycle. First dose on 11/01/2013.  CHEMOTHERAPY INTENT: Curative/control  CURRENT # OF CHEMOTHERAPY CYCLES:2  CURRENT ANTIEMETICS: Zofran, dexamethasone and Compazine  CURRENT SMOKING STATUS: Former smoker  ORAL CHEMOTHERAPY AND CONSENT: None  CURRENT BISPHOSPHONATES USE: None  PAIN MANAGEMENT: 0/10  NARCOTICS INDUCED CONSTIPATION: None  LIVING WILL AND CODE STATUS: Full code   INTERVAL HISTORY: Ernest [name] 57 y.o. male returns to the clinic today for followup visit accompanied his wife. The patient is feeling fine today with no specific complaints. He tolerated the first week of his concurrent chemoradiation fairly well with no significant adverse effects. He lost a few pounds because of lack of appetite. The patient denied having any significant chest pain, shortness breath, cough or hemoptysis. He has no nausea or vomiting. He denied having any significant fever or chills. His brain MRI showed no evidence for metastatic disease to the brain.  MEDICAL HISTORY: Past Medical History  Diagnosis Date  . CAD (coronary artery disease)     Left Main 30% stenosis, LAD 20 - 30 % stenosis, first and second diagonal branchesat 40 - 50%  stenosis with small arteries, circumflex had 30% stenosis in the large obtuse marginal, RCA at 70 - 80%  stenosis [not felt to be occlusive after evaluation with flow wire], distal 50 - 60% stenosis - [name][  . COPD (chronic obstructive  pulmonary disease)     Dr. Jetty Duhamel  . Depression   . Anxiety   . Hyperlipidemia   . Chronic insomnia   . Gout   . GERD (gastroesophageal reflux disease)   . PVD (peripheral vascular disease)     (ABI 0.9 on right and 0.89 on left)  severe left external iliac stenosis.  He  had successful stenting of his left external iliac per Dr. Excell Seltzer.  . DDD (degenerative disc disease)   . Hx of colonoscopy   . 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 Cancer     lung  . OSA (obstructive sleep apnea)     NPSG 09/10/10- AHI 11.3/hr  . Hypertension     dr Antoine Poche  . Lung cancer 10/04/13    LUL squamous cell lung cancer    ALLERGIES:  has No Known Allergies.  MEDICATIONS:  Current Outpatient Prescriptions  Medication Sig Dispense Refill  . albuterol (PROVENTIL HFA;VENTOLIN HFA) 108 (90 BASE) MCG/ACT inhaler Inhale 2 puffs into the lungs every 4 (four) hours as needed for wheezing or shortness of breath.      [name] albuterol (PROVENTIL) (2.5 MG/3ML) 0.083% nebulizer solution Take 2.5 mg by nebulization every 4 (four) hours as needed for wheezing or shortness of breath.      . ALPRAZolam (XANAX) 0.25 MG tablet Take 1 tablet (0.25 mg total) by mouth 2 (two) times daily as needed for anxiety.  30 tablet  0  . amLODipine (NORVASC) 10 MG tablet Take 10 mg by mouth daily.      . ARIPiprazole (ABILIFY) 5 MG tablet Take 1 tablet (5 mg total) by mouth at bedtime.      [name]  atorvastatin (LIPITOR) 20 MG tablet Take 1 tablet (20 mg total) by mouth daily.  90 tablet  3  . carvedilol (COREG) 25 MG tablet Take 1 tablet (25 mg total) by mouth 2 (two) times daily with a meal.  180 tablet  3  . clonazePAM (KLONOPIN) 1 MG tablet Take 1 mg by mouth at bedtime as needed. For anxiety/insomnia      . doxycycline (VIBRA-TABS) 100 MG tablet Take 1 tablet (100 mg total) by mouth 2 (two) times daily.  20 tablet  0  . esomeprazole (NEXIUM) 40 MG capsule Take 1 capsule (40 mg total) by mouth daily before breakfast.  90 capsule  3  .  FLUoxetine (PROZAC) 40 MG capsule Take 40 mg by mouth daily.        . Fluticasone-Salmeterol (ADVAIR DISKUS) 250-50 MCG/DOSE AEPB Inhale 1 puff into the lungs 2 (two) times daily.  1 each  3  . gabapentin (NEURONTIN) 100 MG capsule Take 100 mg by mouth daily.       [name] HYDROcodone-homatropine (HYCODAN) 5-1.5 MG/5ML syrup Take 5 mLs by mouth every 6 (six) hours as needed for cough.  240 mL  0  . Indacaterol Maleate (ARCAPTA NEOHALER) 75 MCG CAPS Place 1 capsule into inhaler and inhale daily.      [name] losartan (COZAAR) 25 MG tablet Take 1 tablet (25 mg total) by mouth daily.  90 tablet  3  . mirtazapine (REMERON) 15 MG tablet Take 1.5 tablets (22.5 mg total) by mouth at bedtime.  90 tablet  1  . modafinil (PROVIGIL) 200 MG tablet Take 200 mg by mouth daily.       [name] morphine (MS CONTIN) 60 MG 12 hr tablet Take 60 mg by mouth 2 (two) times daily.        . Omega-3 Fatty Acids (FISH OIL CONCENTRATE) 1000 MG CAPS Take 1,000 mg by mouth daily.       [name] oxyCODONE-acetaminophen (PERCOCET) 10-325 MG per tablet Take 1 tablet by mouth 3 (three) times daily as needed. For pain      . potassium chloride SA (K-DUR,KLOR-CON) 20 MEQ tablet Take 20 mEq by mouth daily.      . prochlorperazine (COMPAZINE) 10 MG tablet Take 1 tablet (10 mg total) by mouth every 6 (six) hours as needed for nausea or vomiting.  60 tablet  0  . VOLTAREN 1 % GEL Apply 2 g topically daily as needed (for pain).        No current facility-administered medications for this visit.    SURGICAL HISTORY:  Past Surgical History  Procedure Laterality Date  . Spinal fusion  03/05/2007    L4-L5  . Hip surgery      left  . Arm surgery      left  . Shoulder surgery      right  . C-spine surgery    . Angioplasty    . Video bronchoscopy with endobronchial navigation N/A 10/04/2013    Procedure: VIDEO BRONCHOSCOPY WITH ENDOBRONCHIAL NAVIGATION;  Surgeon: Leslye Peer, MD;  Location: [facility] OR;  Service: Thoracic;  Laterality: N/A;  . Back surgery        REVIEW OF SYSTEMS:  A comprehensive review of systems was negative.   PHYSICAL EXAMINATION: General appearance: alert, cooperative and no distress Head: Normocephalic, without obvious abnormality, atraumatic Neck: no adenopathy, no JVD, supple, symmetrical, trachea midline and thyroid not enlarged, symmetric, no tenderness/mass/nodules Lymph nodes: Cervical, supraclavicular, and axillary nodes normal. Resp: clear to auscultation bilaterally Back:  symmetric, no curvature. ROM normal. No CVA tenderness. Cardio: regular rate and rhythm, S1, S2 normal, no murmur, click, rub or gallop GI: soft, non-tender; bowel sounds normal; no masses,  no organomegaly Extremities: extremities normal, atraumatic, no cyanosis or edema  ECOG PERFORMANCE STATUS: 0 - Asymptomatic  Blood pressure 139/60, pulse 75, temperature 97.1 F (36.2 C), temperature source Oral, resp. rate 18, height 5\' 7"  (1.702 m), weight 178 lb 1.6 oz (80.786 kg).  LABORATORY DATA: Lab Results  Component Value Date   WBC 11.2* 11/08/2013   HGB 13.2 11/08/2013   HCT 40.1 11/08/2013   MCV 84.4 11/08/2013   PLT 200 11/08/2013      Chemistry      Component Value Date/Time   NA 139 11/01/2013 0905   NA 136 10/08/2013 0958   K 3.5 11/01/2013 0905   K 3.1* 10/08/2013 0958   CL 100 10/08/2013 0958   CO2 26 11/01/2013 0905   CO2 29 10/08/2013 0958   BUN 9.0 11/01/2013 0905   BUN 10 10/08/2013 0958   CREATININE 0.8 11/01/2013 0905   CREATININE 0.8 10/08/2013 0958      Component Value Date/Time   CALCIUM 9.9 11/01/2013 0905   CALCIUM 9.5 10/08/2013 0958   ALKPHOS 132 11/01/2013 0905   ALKPHOS 141* 10/01/2013 1400   AST 15 11/01/2013 0905   AST 18 10/01/2013 1400   ALT 18 11/01/2013 0905   ALT 19 10/01/2013 1400   BILITOT 0.47 11/01/2013 0905   BILITOT 0.3 10/01/2013 1400       RADIOGRAPHIC STUDIES: Mr Laqueta Jean Wo Contrast  November 12, 2013   CLINICAL DATA:  Lung cancer staging. New onset of headaches 1 month ago.  EXAM: MRI HEAD  WITHOUT AND WITH CONTRAST  TECHNIQUE: Multiplanar, multiecho pulse sequences of the brain and surrounding structures were obtained without and with intravenous contrast.  CONTRAST:  18mL MULTIHANCE GADOBENATE DIMEGLUMINE 529 MG/ML IV SOLN  COMPARISON:  None contrast head CT 03/30/2009  FINDINGS: A few punctate foci of T2 hyperintensity are present in the cerebral white matter bilaterally, nonspecific but may reflect minimal chronic small vessel ischemic disease. There is no evidence of acute infarct, mass, midline shift, intracranial hemorrhage, or extra-axial fluid collection. There is no abnormal enhancement.  A normal distal right vertebral artery flow void is not identified. 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 Other major intracranial flow voids are unremarkable. Orbits are normal. Mild anterior left ethmoid and bilateral maxillary sinus mucosal thickening are noted. Calvarium is normal in signal.  IMPRESSION: No evidence of intracranial metastasis.   Electronically Signed   By: Sebastian Ache   On: 11-12-2013 13:22   Nm Pet Image Initial (pi) Skull Base To Thigh  11/12/2013   CLINICAL DATA:  Initial treatment strategy for lung carcinoma.  EXAM: NUCLEAR MEDICINE PET SKULL BASE TO THIGH  FASTING BLOOD GLUCOSE:  Value: 94mg /dl  TECHNIQUE: 44.0 mCi H-47 FDG was injected intravenously. CT data was obtained and used for attenuation correction and anatomic localization only. (This was not acquired as a diagnostic CT examination.) Additional exam technical data entered on technologist worksheet.  COMPARISON:  CT thorax 09/22/2013,  FINDINGS: NECK  No hypermetabolic lymph nodes in the neck. Asymmetric vocal cord activity with right greater than left suggests paralysis of the left.  CHEST  Hypermetabolic left suprahilar mass measures 4.4 x 5.3 cm with SUV max = 29.1. There is a hypermetabolic prevascular node measuring 16 mm (image 76). This extends into  the  AP window and likely involves the left recurrent laryngeal nerve with paralysis of left vocal cord. Additional hypermetabolic prevascular node on image 77). There are no hypermetabolic contralateral lymph nodes or supraclavicular lymph nodes.  ABDOMEN/PELVIS  Low-density lesion within the spleen does not have associated metabolic activity. No hypermetabolic abdominal pelvic lymph nodes. No abnormal metabolic activity within the solid organs.  SKELETON  No focal hypermetabolic activity to suggest skeletal metastasis.  IMPRESSION: 1. Hypermetabolic left suprahilar mass abuts the of pleural surface of the mediastinum. 2. 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 Recommend correlation with hoarseness. 3. No contralateral nodal metastasis, supraclavicular nodal metastasis, or distant metastasis. . Staging by FDG PET imaging is T3 N2 M0.   Electronically Signed   By: Genevive Bi M.D.   On: 10/19/2013 11:42    ASSESSMENT AND PLAN: This is a very pleasant 57 years old white male with stage IIIa non-small cell lung cancer, squamous cell carcinoma currently undergoing concurrent chemoradiation with weekly carboplatin and paclitaxel status post 1 week of treatment. He is tolerating his treatment fairly well with no significant adverse effects. I recommended for the patient to proceed with cycle #2 today as scheduled. He would come back for followup visit in 2 weeks for evaluation and management any adverse effect of his treatment. He was advised to call immediately if he has any concerning symptoms in the interval. The patient voices understanding of current disease status and treatment options and is in agreement with the current care plan.  All questions were answered. The patient knows to call the clinic with any problems, questions or concerns. We can certainly see the patient much sooner if necessary.  I spent 10 minutes  counseling the patient face to face. The total time spent in the appointment was 15 minutes.

## 2013-11-08 NOTE — Patient Instructions (Signed)
CURRENT THERAPY: Concurrent chemoradiation with weekly carboplatin for AUC of 2 and paclitaxel 45 mg/M2, status post 1 cycle. First dose on 11/01/2013.  CHEMOTHERAPY INTENT: Curative/control  CURRENT # OF CHEMOTHERAPY CYCLES:2  CURRENT ANTIEMETICS: Zofran, dexamethasone and Compazine  CURRENT SMOKING STATUS: Former smoker  ORAL CHEMOTHERAPY AND CONSENT: None  CURRENT BISPHOSPHONATES USE: None  PAIN MANAGEMENT: 0/10  NARCOTICS INDUCED CONSTIPATION: None  LIVING WILL AND CODE STATUS: Full code

## 2013-11-08 NOTE — Telephone Encounter (Signed)
gv and printed appt sched and avs for pt for DEC °

## 2013-11-09 ENCOUNTER — Encounter: Payer: Self-pay | Admitting: Nurse Practitioner

## 2013-11-09 ENCOUNTER — Ambulatory Visit (INDEPENDENT_AMBULATORY_CARE_PROVIDER_SITE_OTHER): Payer: Medicare HMO | Admitting: Nurse Practitioner

## 2013-11-09 VITALS — BP 140/72 | HR 70 | Ht 67.0 in | Wt 181.1 lb

## 2013-11-09 DIAGNOSIS — R079 Chest pain, unspecified: Secondary | ICD-10-CM

## 2013-11-09 MED ORDER — NITROGLYCERIN 0.4 MG SL SUBL: 0.4000 mg | SUBLINGUAL_TABLET | SUBLINGUAL | Status: DC | PRN

## 2013-11-09 MED ORDER — ISOSORBIDE MONONITRATE ER 30 MG PO TB24: 30.0000 mg | ORAL_TABLET | Freq: Every day | ORAL | Status: DC

## 2013-11-09 NOTE — Patient Instructions (Signed)
We will arrange for a stress test (Lexiscan)  I am starting you on Imdur 30 mg a day  I have sent in a new RX for NTG - Use your NTG under your tongue for recurrent chest pain. May take one tablet every 5 minutes. If you are still having discomfort after 3 tablets in 15 minutes, call 911.  I will see you back for discussion  Call the [facility] office at [phone] if you have any questions, problems or concerns.

## 2013-11-09 NOTE — Progress Notes (Signed)
Ernest [name] Date of Birth: 1956-06-07 Medical Record #[id number]  History of Present Illness: Mr. Goody is seen today for a work in visit. Seen for Dr. Antoine Poche. He has known CAD with prior cath and PVD with past left iliac stenting. Other issues include HTN, HLD, COPD gout, GERD, anxiety and depression. He is a past smoker. 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 He is currently undergoing chemoradiation.  Last seen here in December a year ago.   Comes back today. Here alone. Saw Dr. Shirline Frees earlier this week - starting week 2 of his therapy. Saw Dr. Delton Coombes last week - endorsed chest pressure with radiation to the left arm - not sure if this was from his cancer or was his CAD. He tells me he was diagnosed with his lung cancer about 2 months ago. Started having chest pressure about 3 weeks ago - no NTG use. Says it is not radiating. His breathing has gotten worse. The discomfort is not exertional. Will last 30 minutes or several hours. Starts radiation tomorrow.   Current Outpatient Prescriptions  Medication Sig Dispense Refill  . albuterol (PROVENTIL HFA;VENTOLIN HFA) 108 (90 BASE) MCG/ACT inhaler Inhale 2 puffs into the lungs every 4 (four) hours as needed for wheezing or shortness of breath.      [name] albuterol (PROVENTIL) (2.5 MG/3ML) 0.083% nebulizer solution Take 2.5 mg by nebulization every 4 (four) hours as needed for wheezing or shortness of breath.      . ALPRAZolam (XANAX) 0.25 MG tablet Take 1 tablet (0.25 mg total) by mouth 2 (two) times daily as needed for anxiety.  30 tablet  0  . amLODipine (NORVASC) 10 MG tablet Take 10 mg by mouth daily.      . ARIPiprazole (ABILIFY) 5 MG tablet Take 1 tablet (5 mg total) by mouth at bedtime.      [name] atorvastatin (LIPITOR) 20 MG tablet Take 1 tablet (20 mg total) by mouth  daily.  90 tablet  3  . carvedilol (COREG) 25 MG tablet Take 1 tablet (25 mg total) by mouth 2 (two) times daily with a meal.  180 tablet  3  . clonazePAM (KLONOPIN) 1 MG tablet Take 1 mg by mouth at bedtime as needed. For anxiety/insomnia      . doxycycline (VIBRA-TABS) 100 MG tablet Take 1 tablet (100 mg total) by mouth 2 (two) times daily.  20 tablet  0  . esomeprazole (NEXIUM) 40 MG capsule Take 1 capsule (40 mg total) by mouth daily before breakfast.  90 capsule  3  . FLUoxetine (PROZAC) 40 MG capsule Take 40 mg by mouth daily.        . Fluticasone-Salmeterol (ADVAIR DISKUS) 250-50 MCG/DOSE AEPB Inhale 1 puff into the lungs 2 (two) times daily.  1 each  3  . gabapentin (NEURONTIN) 100 MG capsule Take 100 mg by mouth daily.       [name] HYDROcodone-homatropine (HYCODAN) 5-1.5 MG/5ML syrup Take 5 mLs by mouth every 6 (six) hours as needed for cough.  240 mL  0  . Indacaterol Maleate (ARCAPTA NEOHALER) 75 MCG CAPS Place 1 capsule into inhaler and inhale daily.      [name] losartan (COZAAR) 25 MG tablet Take 1 tablet (25 mg total) by mouth daily.  90 tablet  3  . methocarbamol (ROBAXIN) 500 MG tablet Take 500 mg by mouth 2 (two)  times daily.       . mirtazapine (REMERON) 15 MG tablet Take 1.5 tablets (22.5 mg total) by mouth at bedtime.  90 tablet  1  . modafinil (PROVIGIL) 200 MG tablet Take 200 mg by mouth daily.       [name] morphine (MS CONTIN) 60 MG 12 hr tablet Take 60 mg by mouth 2 (two) times daily.        . Omega-3 Fatty Acids (FISH OIL CONCENTRATE) 1000 MG CAPS Take 1,000 mg by mouth daily.       [name] oxyCODONE-acetaminophen (PERCOCET) 10-325 MG per tablet Take 1 tablet by mouth 3 (three) times daily as needed. For pain      . potassium chloride SA (K-DUR,KLOR-CON) 20 MEQ tablet Take 20 mEq by mouth daily.      . prochlorperazine (COMPAZINE) 10 MG tablet Take 1 tablet (10 mg total) by mouth every 6 (six) hours as needed for nausea or vomiting.  60 tablet  0  . VOLTAREN 1 % GEL Apply 2 g topically daily  as needed (for pain).        No current facility-administered medications for this visit.    No Known Allergies  Past Medical History  Diagnosis Date  . CAD (coronary artery disease)     Left Main 30% stenosis, LAD 20 - 30 % stenosis, first and second diagonal branchesat 40 - 50%  stenosis with small arteries, circumflex had 30% stenosis in the large obtuse marginal, RCA at 70 - 80%  stenosis [not felt to be occlusive after evaluation with flow wire], distal 50 - 60% stenosis - [name][  . COPD (chronic obstructive pulmonary disease)     Dr. Jetty Duhamel  . Depression   . Anxiety   . Hyperlipidemia   . Chronic insomnia   . Gout   . GERD (gastroesophageal reflux disease)   . PVD (peripheral vascular disease)     (ABI 0.9 on right and 0.89 on left)  severe left external iliac stenosis.  He  had successful stenting of his left external iliac per Dr. Excell Seltzer.  . DDD (degenerative disc disease)   . Hx of colonoscopy   . 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 Cancer     lung  . OSA (obstructive sleep apnea)     NPSG 09/10/10- AHI 11.3/hr  . Hypertension     dr Antoine Poche  . Lung cancer 10/04/13    LUL squamous cell lung cancer    Past Surgical History  Procedure Laterality Date  . Spinal fusion  03/05/2007    L4-L5  . Hip surgery      left  . Arm surgery      left  . Shoulder surgery      right  . C-spine surgery    . Angioplasty    . Video bronchoscopy with endobronchial navigation N/A 10/04/2013    Procedure: VIDEO BRONCHOSCOPY WITH ENDOBRONCHIAL NAVIGATION;  Surgeon: Leslye Peer, MD;  Location: [facility] OR;  Service: Thoracic;  Laterality: N/A;  . Back surgery      History  Smoking status  . Former Smoker -- 0.50 packs/day for 43 years  . Types: Cigarettes  . Quit date: 09/23/2013  Smokeless tobacco  . Never Used    Comment: On Chantix. smokes 2- 3 cigarettes daily, history of 3 PPD, 11/02/13 not smoking at all    History  Alcohol Use No    Family History    Problem Relation Age of Onset  . Heart attack  Mother   . Heart attack Sister   . Lung cancer Sister   . Cancer Sister     small cell lung, mets to brain  . Emphysema Sister   . Hypertension Brother     Review of Systems: The review of systems is per the HPI.  All other systems were reviewed and are negative.  Physical Exam: BP 140/72  Pulse 70  Ht 5\' 7"  (1.702 m)  Wt 181 lb 1.9 oz (82.155 kg)  BMI 28.36 kg/m2 Patient is very pleasant and in no acute distress. Skin is warm and dry. Color is normal.  HEENT is unremarkable. Normocephalic/atraumatic. PERRL. Sclera are nonicteric. Neck is supple. No masses. No JVD. Lungs are coarse. Cardiac exam shows a regular rate and rhythm. Abdomen is soft. Extremities are without edema. Gait and ROM are intact. No gross neurologic deficits noted.  LABORATORY DATA: EKG with sinus rhythm. T wave inversion in V2.  Lab Results  Component Value Date   WBC 11.2* 11/08/2013   HGB 13.2 11/08/2013   HCT 40.1 11/08/2013   PLT 200 11/08/2013   GLUCOSE 104 11/08/2013   CHOL 122 04/27/2013   TRIG 138.0 04/27/2013   HDL 35.20* 04/27/2013   LDLDIRECT 71.4 09/08/2012   LDLCALC 59 04/27/2013   ALT 18 11/01/2013   AST 15 11/01/2013   NA 137 11/08/2013   K 3.7 11/08/2013   CL 100 10/08/2013   CREATININE 0.7 11/08/2013   BUN 10.6 11/08/2013   CO2 24 11/08/2013   TSH 1.39 04/27/2013   PSA 0.64 07/18/2011   INR 1.00 10/01/2013   HGBA1C 6.1 10/08/2013    CORONARY ANGIOGRAPHY 2007 Left main coronary had 30% distal stenosis.  Left anterior descending artery had 20-30% multi-discrete lesion in the  proximal and mid-vessel. Distal vessel was large and wrapped the apex. The  patient had 40-50% disease in the first and second diagonal branches which  were small arteries.  Circumflex coronary artery was nondominant. There was one large obtuse  marginal branch. The proximal vessel had 30% multi-discrete lesions as did  a large obtuse marginal branch.  Right coronary  artery was somewhat small. There was diffuse 50-60% disease  proximally. There was a long segment of 70% to possibly 80% disease in the  mid to distal vessel.  RIGHT HEART CATHETERIZATION: Right heart catheterization showed normal  pressure with no evidence of pulmonary hypertension.  Mean right atrial pressure was 13, RV pressure was 37/13. PA pressure was  34/20. Mean pulmonary capillary wedge pressure was 18.  Aortic pressure is 180/77, LV pressure is 185/15.  Fick cardiac output was 2.4 liters per minute.  RAO VENTRICULOGRAPHY: RAO ventriculography showed normal LV function. EF  was 60%. There was no gradient across the aortic valve and no MR.  Selective injection of the renals showed them to be widely patent on both  left and right sides.  Distal aortogram showed no significant distal aortic or iliac disease.  IMPRESSION: Patient has normal left ventricular function without evidence  of pulmonary hypertension. There is no renal artery stenosis and no distal  aortic disease. He has significant essential hypertension. He was treated  with enalapril in the lab.  He has single-vessel coronary disease. I will review the films with Dr.  Antoine Poche but I suspect that despite needing a fairly large length of stent  that we should probably intervene in the mid to distal right coronary  artery.  The patient tolerated the procedure.  ______________________________  Charlton Haws, M.D.  PN/MEDQ D: 02/17/2006 T: 02/18/2006 Job: [id number]   The patient has angiographically moderate narrowing of the  right coronary artery (approximately 60%). FFR demonstrates this not to be  significant. The FFR is far from the level that we would expect to cause  resting symptoms. Thus, his coronary disease does not explain his symptoms.  We will check a pulmonary function test and have him follow up with Dr. Artist Pais  and Dr. Antoine Poche.  Salvadore Farber, M.D. [facility]  Electronically Signed   Myoview Impression from  October 2013  Exercise Capacity: Lexiscan with no exercise.  BP Response: Normal blood pressure response.  Clinical Symptoms: There is dyspnea.  ECG Impression: No significant ST segment change suggestive of ischemia.  Comparison with Prior Nuclear Study: No images to compare  Overall Impression: Normal stress nuclear study. RV appears dilated  LV Ejection Fraction: 60%. LV Wall Motion: NL LV Function; NL Wall Motion  Charlton Haws   PET IMPRESSION: 1. Hypermetabolic left suprahilar mass abuts the of pleural surface of the mediastinum. 2. 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 Recommend correlation with hoarseness. 3. No contralateral nodal metastasis, supraclavicular nodal metastasis, or distant metastasis. . Staging by FDG PET imaging is T3 N2 M0.   Electronically Signed By: Genevive Bi M.D. On: 10/19/2013 11:42  CT CHEST IMPRESSION: 1. 5.7 x 4.7 x 5.0 cm left upper lobe mass which most likely represents a primary bronchogenic carcinoma. This is associated with some postobstructive changes in the left upper lobe as well as left hilar and AP window lymphadenopathy. Based on these findings, and assuming lack of distant metastatic disease (which will have to be proven by other imaging studies), this is favored to represent at least T2b (if not T3 because of satellite nodules in the left upper lobe), N2, Mx disease (i.e., at least stage IIIA disease). Further evaluation with PET-CT and brain MRI with and without IV gadolinium is suggested at this time for complete staging. 2. Mild centrilobular and paraseptal emphysema. 3. Atherosclerosis, including 3 vessel coronary artery disease. Assessment for potential risk factor modification, dietary therapy or pharmacologic therapy may be warranted, if clinically indicated. 4. Additional incidental findings, as above.   Electronically  Signed By: Trudie Reed M.D. On: 09/22/2013 11:34    Assessment / Plan: 1. Chest pain - known CAD - remote cath 7 years ago - now with chest pressure - has lots of CV risk factors and now with a large left lung cancer. Discussed with Dr. Antoine Poche - he would like to proceed with Lexiscan to further define and try to manage conservatively if possible. I have started him on Imdur 30 mg a day. Refilled his NTG. Will see him back for discussion.   2. Lung cancer - with chemoradiation in process.   3. HTN  4. HLD  Patient is agreeable to this plan and will call if any problems develop in the interim.   Rosalio Macadamia, RN, ANP-C [facility] [address] [place], [place]  [address]

## 2013-11-10 ENCOUNTER — Encounter: Payer: Self-pay | Admitting: Radiation Oncology

## 2013-11-10 ENCOUNTER — Ambulatory Visit
Admission: RE | Admit: 2013-11-10 | Discharge: 2013-11-10 | Disposition: A | Discharge status: Home / Self Care | Payer: Commercial Managed Care - HMO | Source: Ambulatory Visit | Attending: Radiation Oncology | Admitting: Radiation Oncology

## 2013-11-10 ENCOUNTER — Telehealth: Payer: Self-pay | Admitting: *Deleted

## 2013-11-10 DIAGNOSIS — Z510 Encounter for antineoplastic radiation therapy: Secondary | ICD-10-CM | POA: Diagnosis not present

## 2013-11-10 MED ORDER — RADIAPLEXRX EX GEL
Freq: Once | CUTANEOUS | Status: AC
  Administered 2013-11-10: 09:00:00 via TOPICAL

## 2013-11-10 NOTE — Progress Notes (Signed)
Post sim ed completed w/pt and wife. Gave pt "Radiation and You" booklet w/all pertinent information marked and discussed,re : fatigue, skin irritation/care, throat irritation/management, nutrition, pain. Gave pt Radiaplex w/instructions for proper use. Printed calendar Dec 2014 - Feb 2015 and explained different information to pt and wife. Scheduled nutrition consult for pt on 11/12/13. Pt notified by phone per Jacolyn Reedy, medical secretary.  Pt and wife verbalized understanding.

## 2013-11-10 NOTE — Progress Notes (Signed)
Chart note: The patient underwent treatment today with a sonogram. He is being treated to 6.6 delivered field widths to conform his field. This represents 6 complex treatment devices.

## 2013-11-10 NOTE — Telephone Encounter (Signed)
CALLED PATIENT TO INFORM OF NUTRITION APPT. FOR 11-12-13, LVM FOR A RETURN CALL

## 2013-11-11 ENCOUNTER — Ambulatory Visit
Admission: RE | Admit: 2013-11-11 | Discharge: 2013-11-11 | Disposition: A | Discharge status: Home / Self Care | Payer: Commercial Managed Care - HMO | Source: Ambulatory Visit | Attending: Radiation Oncology | Admitting: Radiation Oncology

## 2013-11-11 DIAGNOSIS — Z510 Encounter for antineoplastic radiation therapy: Secondary | ICD-10-CM | POA: Diagnosis not present

## 2013-11-12 ENCOUNTER — Ambulatory Visit
Admission: RE | Admit: 2013-11-12 | Discharge: 2013-11-12 | Disposition: A | Discharge status: Home / Self Care | Payer: Commercial Managed Care - HMO | Source: Ambulatory Visit | Attending: Radiation Oncology | Admitting: Radiation Oncology

## 2013-11-12 ENCOUNTER — Ambulatory Visit: Payer: Commercial Managed Care - HMO | Admitting: Nutrition

## 2013-11-12 DIAGNOSIS — Z510 Encounter for antineoplastic radiation therapy: Secondary | ICD-10-CM | POA: Diagnosis not present

## 2013-11-12 NOTE — Progress Notes (Signed)
This is a 57 year old male diagnosed with NSCLC receiving concurrent chemoradiation therapy.  Past medical history includes CAD, COPD, depression, anxiety, hyperlipidemia, gout, GERD, peripheral vascular disease, obstructive sleep apnea, and hypertension.  Medications include Compazine, Xanax, Abilify, Lipitor, Klonopin, Nexium, Prozac, omega-3 fatty acids, and K-Dur.  Labs were reviewed.  Height: 67 inches. Weight: 181 pounds December 16. Usual body weight: 190 pounds. BMI: 28.36.  Patient reports poor appetite.  He has had increased nausea all week.  He reports taking his Compazine, every 6 hours, which improves nausea, but does not resolve.  It.  He has been eating less because of this and therefore, has had a 9 pound weight loss from usual body weight.  Patient reports a slightly sore throat.  Patient denies other nutrition side effects at this time.  Nutrition diagnosis: Unintended weight loss related to new diagnosis of non-small cell lung cancer and associated treatments as evidenced by 9 pound weight loss from usual body weight.  Intervention: Patient was educated to increase calories and protein in small, frequent meals throughout the day.  I educated him on foods to consume that will not aggravate nausea.  I've encouraged patient to comply with nausea medication and call physician if nausea is not resolved.  Patient was educated to try oral nutrition supplements such as Carnation breakfast essentials to add additional calories and protein.  Patient was also provided with samples of ensure and boost along with coupons for purchase.  Questions were answered.  Teach back method was used.  Monitoring, evaluation, goals: Patient will tolerate adequate calories and protein for minimal weight loss throughout treatment.  Next visit: Monday, December 29, during chemotherapy.

## 2013-11-14 ENCOUNTER — Other Ambulatory Visit: Payer: Self-pay | Admitting: Cardiology

## 2013-11-15 ENCOUNTER — Ambulatory Visit
Admission: RE | Admit: 2013-11-15 | Discharge: 2013-11-15 | Disposition: A | Discharge status: Home / Self Care | Payer: Commercial Managed Care - HMO | Source: Ambulatory Visit | Attending: Radiation Oncology | Admitting: Radiation Oncology

## 2013-11-15 ENCOUNTER — Encounter: Payer: Self-pay | Admitting: Radiation Oncology

## 2013-11-15 ENCOUNTER — Ambulatory Visit (HOSPITAL_BASED_OUTPATIENT_CLINIC_OR_DEPARTMENT_OTHER): Payer: Commercial Managed Care - HMO

## 2013-11-15 ENCOUNTER — Other Ambulatory Visit (HOSPITAL_BASED_OUTPATIENT_CLINIC_OR_DEPARTMENT_OTHER): Payer: Commercial Managed Care - HMO

## 2013-11-15 VITALS — BP 124/79 | HR 80 | Temp 98.4°F | Resp 20 | Wt 177.4 lb

## 2013-11-15 VITALS — BP 144/64 | HR 88 | Temp 98.8°F | Resp 18

## 2013-11-15 DIAGNOSIS — Z510 Encounter for antineoplastic radiation therapy: Secondary | ICD-10-CM | POA: Diagnosis not present

## 2013-11-15 DIAGNOSIS — Z5111 Encounter for antineoplastic chemotherapy: Secondary | ICD-10-CM

## 2013-11-15 DIAGNOSIS — C3492 Malignant neoplasm of unspecified part of left bronchus or lung: Secondary | ICD-10-CM

## 2013-11-15 DIAGNOSIS — C3410 Malignant neoplasm of upper lobe, unspecified bronchus or lung: Secondary | ICD-10-CM

## 2013-11-15 LAB — CBC WITH DIFFERENTIAL/PLATELET
BASO%: 0.3 % (ref 0.0–2.0)
EOS%: 1.3 % (ref 0.0–7.0)
HCT: 40.1 % (ref 38.4–49.9)
LYMPH%: 9.8 % — ABNORMAL LOW (ref 14.0–49.0)
MCH: 27.6 pg (ref 27.2–33.4)
MCHC: 32.9 g/dL (ref 32.0–36.0)
MCV: 83.9 fL (ref 79.3–98.0)
NEUT%: 77.4 % — ABNORMAL HIGH (ref 39.0–75.0)
RBC: 4.78 10*6/uL (ref 4.20–5.82)
RDW: 14.4 % (ref 11.0–14.6)
lymph#: 0.8 10*3/uL — ABNORMAL LOW (ref 0.9–3.3)
nRBC: 0 % (ref 0–0)

## 2013-11-15 LAB — BASIC METABOLIC PANEL (CC13)
Anion Gap: 11 mEq/L (ref 3–11)
Chloride: 101 mEq/L (ref 98–109)
Glucose: 125 mg/dl (ref 70–140)
Potassium: 3.7 mEq/L (ref 3.5–5.1)
Sodium: 137 mEq/L (ref 136–145)

## 2013-11-15 MED ORDER — HEPARIN SOD (PORK) LOCK FLUSH 100 UNIT/ML IV SOLN
500.0000 [IU] | Freq: Once | INTRAVENOUS | Status: DC | PRN
  Filled 2013-11-15: qty 5

## 2013-11-15 MED ORDER — ONDANSETRON 16 MG/50ML IVPB (CHCC)
INTRAVENOUS | Status: AC
  Filled 2013-11-15: qty 16

## 2013-11-15 MED ORDER — DIPHENHYDRAMINE HCL 50 MG/ML IJ SOLN
INTRAMUSCULAR | Status: AC
  Filled 2013-11-15: qty 1

## 2013-11-15 MED ORDER — DEXAMETHASONE SODIUM PHOSPHATE 20 MG/5ML IJ SOLN
20.0000 mg | Freq: Once | INTRAMUSCULAR | Status: AC
  Administered 2013-11-15: 20 mg via INTRAVENOUS

## 2013-11-15 MED ORDER — FAMOTIDINE IN NACL 20-0.9 MG/50ML-% IV SOLN
INTRAVENOUS | Status: AC
  Filled 2013-11-15: qty 50

## 2013-11-15 MED ORDER — SODIUM CHLORIDE 0.9 % IV SOLN
45.0000 mg/m2 | Freq: Once | INTRAVENOUS | Status: AC
  Administered 2013-11-15: 90 mg via INTRAVENOUS
  Filled 2013-11-15: qty 15

## 2013-11-15 MED ORDER — FAMOTIDINE IN NACL 20-0.9 MG/50ML-% IV SOLN
20.0000 mg | Freq: Once | INTRAVENOUS | Status: AC
  Administered 2013-11-15: 20 mg via INTRAVENOUS

## 2013-11-15 MED ORDER — SODIUM CHLORIDE 0.9 % IV SOLN
Freq: Once | INTRAVENOUS | Status: AC
  Administered 2013-11-15: 09:00:00 via INTRAVENOUS

## 2013-11-15 MED ORDER — DIPHENHYDRAMINE HCL 50 MG/ML IJ SOLN
50.0000 mg | Freq: Once | INTRAMUSCULAR | Status: AC
  Administered 2013-11-15: 50 mg via INTRAVENOUS

## 2013-11-15 MED ORDER — DEXAMETHASONE SODIUM PHOSPHATE 20 MG/5ML IJ SOLN
INTRAMUSCULAR | Status: AC
  Filled 2013-11-15: qty 5

## 2013-11-15 MED ORDER — ONDANSETRON 16 MG/50ML IVPB (CHCC)
16.0000 mg | Freq: Once | INTRAVENOUS | Status: AC
  Administered 2013-11-15: 16 mg via INTRAVENOUS

## 2013-11-15 MED ORDER — SODIUM CHLORIDE 0.9 % IJ SOLN
10.0000 mL | INTRAMUSCULAR | Status: DC | PRN
  Filled 2013-11-15: qty 10

## 2013-11-15 MED ORDER — SODIUM CHLORIDE 0.9 % IV SOLN
284.0000 mg | Freq: Once | INTRAVENOUS | Status: AC
  Administered 2013-11-15: 280 mg via INTRAVENOUS
  Filled 2013-11-15: qty 28

## 2013-11-15 NOTE — Progress Notes (Signed)
Weekly Management Note:  Site: Left Current Dose:  800  cGy Projected Dose: 6600  cGy  Narrative: The patient is seen today for routine under treatment assessment. CBCT/MVCT images/port films were reviewed. The chart was reviewed.   He still well except for some nausea. He takes Compazine when necessary. He receives chemotherapy each Monday.  Physical Examination:  Filed Vitals:   11/15/13 1317  BP: 124/79  Pulse: 80  Temp: 98.4 F (36.9 C)  Resp: 20  .  Weight: 177 lb 6.4 oz (80.468 kg). No change.  Laboratory data: Lab Results  Component Value Date   WBC 7.9 11/15/2013   HGB 13.2 11/15/2013   HCT 40.1 11/15/2013   MCV 83.9 11/15/2013   PLT 186 11/15/2013   Impression: Tolerating radiation therapy well.  Plan: Continue radiation therapy as planned.

## 2013-11-15 NOTE — Patient Instructions (Signed)
[facility] Discharge Instructions for Patients Receiving Chemotherapy  Today you received the following chemotherapy agents Taxol/Carbo  To help prevent nausea and vomiting after your treatment, we encourage you to take your nausea medication  As directed If you develop nausea and vomiting that is not controlled by your nausea medication, call the clinic.   BELOW ARE SYMPTOMS THAT SHOULD BE REPORTED IMMEDIATELY:  *FEVER GREATER THAN 100.5 F  *CHILLS WITH OR WITHOUT FEVER  NAUSEA AND VOMITING THAT IS NOT CONTROLLED WITH YOUR NAUSEA MEDICATION  *UNUSUAL SHORTNESS OF BREATH  *UNUSUAL BRUISING OR BLEEDING  TENDERNESS IN MOUTH AND THROAT WITH OR WITHOUT PRESENCE OF ULCERS  *URINARY PROBLEMS  *BOWEL PROBLEMS  UNUSUAL RASH Items with * indicate a potential emergency and should be followed up as soon as possible.  Feel free to call the clinic you have any questions or concerns. The clinic phone number is [phone].

## 2013-11-15 NOTE — Progress Notes (Signed)
Pt denies pain. He is fatigued, occasional cough w/"off-white" sputum, SOB w/exertion, loss of appetite due to nausea. Pt taking Compazine.

## 2013-11-16 ENCOUNTER — Ambulatory Visit (HOSPITAL_COMMUNITY): Payer: Medicare HMO | Attending: Cardiology | Admitting: Radiology

## 2013-11-16 ENCOUNTER — Ambulatory Visit: Payer: Commercial Managed Care - HMO

## 2013-11-16 ENCOUNTER — Ambulatory Visit
Admission: RE | Admit: 2013-11-16 | Discharge: 2013-11-16 | Disposition: A | Discharge status: Home / Self Care | Payer: Commercial Managed Care - HMO | Source: Ambulatory Visit | Attending: Radiation Oncology | Admitting: Radiation Oncology

## 2013-11-16 ENCOUNTER — Encounter: Payer: Self-pay | Admitting: Cardiology

## 2013-11-16 VITALS — BP 116/64 | Ht 67.0 in | Wt 174.0 lb

## 2013-11-16 DIAGNOSIS — J4489 Other specified chronic obstructive pulmonary disease: Secondary | ICD-10-CM | POA: Insufficient documentation

## 2013-11-16 DIAGNOSIS — R0609 Other forms of dyspnea: Secondary | ICD-10-CM | POA: Insufficient documentation

## 2013-11-16 DIAGNOSIS — R5381 Other malaise: Secondary | ICD-10-CM | POA: Insufficient documentation

## 2013-11-16 DIAGNOSIS — R0989 Other specified symptoms and signs involving the circulatory and respiratory systems: Secondary | ICD-10-CM | POA: Insufficient documentation

## 2013-11-16 DIAGNOSIS — R002 Palpitations: Secondary | ICD-10-CM | POA: Insufficient documentation

## 2013-11-16 DIAGNOSIS — I10 Essential (primary) hypertension: Secondary | ICD-10-CM | POA: Insufficient documentation

## 2013-11-16 DIAGNOSIS — R0602 Shortness of breath: Secondary | ICD-10-CM

## 2013-11-16 DIAGNOSIS — J449 Chronic obstructive pulmonary disease, unspecified: Secondary | ICD-10-CM | POA: Insufficient documentation

## 2013-11-16 DIAGNOSIS — Z510 Encounter for antineoplastic radiation therapy: Secondary | ICD-10-CM | POA: Diagnosis not present

## 2013-11-16 DIAGNOSIS — Z87891 Personal history of nicotine dependence: Secondary | ICD-10-CM | POA: Insufficient documentation

## 2013-11-16 DIAGNOSIS — I2510 Atherosclerotic heart disease of native coronary artery without angina pectoris: Secondary | ICD-10-CM

## 2013-11-16 DIAGNOSIS — Z8249 Family history of ischemic heart disease and other diseases of the circulatory system: Secondary | ICD-10-CM | POA: Insufficient documentation

## 2013-11-16 DIAGNOSIS — R079 Chest pain, unspecified: Secondary | ICD-10-CM

## 2013-11-16 DIAGNOSIS — I739 Peripheral vascular disease, unspecified: Secondary | ICD-10-CM | POA: Insufficient documentation

## 2013-11-16 MED ORDER — REGADENOSON 0.4 MG/5ML IV SOLN
0.4000 mg | Freq: Once | INTRAVENOUS | Status: AC
  Administered 2013-11-16: 0.4 mg via INTRAVENOUS

## 2013-11-16 MED ORDER — TECHNETIUM TC 99M SESTAMIBI GENERIC - CARDIOLITE
33.0000 | Freq: Once | INTRAVENOUS | Status: AC | PRN
  Administered 2013-11-16: 33 via INTRAVENOUS

## 2013-11-16 MED ORDER — TECHNETIUM TC 99M SESTAMIBI GENERIC - CARDIOLITE
11.0000 | Freq: Once | INTRAVENOUS | Status: AC | PRN
  Administered 2013-11-16: 11 via INTRAVENOUS

## 2013-11-16 NOTE — Progress Notes (Signed)
[facility] SITE 3 NUCLEAR MED [address] [place], [place] [address] [phone]    Cardiology Nuclear Med Study  Ernest [name] is a 57 y.o. male     MRN : [id number]     DOB: 1956/02/17  Procedure Date: 11/16/2013  Nuclear Med Background Indication for Stress Test:  Evaluation for Ischemia History:  Asthma, COPD and 2007 CATH: Multi vessel N/O Dz, '13 MPI" EF: 60% NL 10/14 Cardiac CT 3V CAD  Cardiac Risk Factors: Family History - CAD, History of Smoking, Hypertension, Lipids and PVD  Symptoms:  Chest Pain, DOE, Fatigue and Palpitations   Nuclear Pre-Procedure Caffeine/Decaff Intake:  None NPO After: 10:00pm   Lungs:  clear O2 Sat: 96% on room air. IV 0.9% NS with Angio Cath:  22g  IV Site: R Hand  IV Started by:  Bonnita Levan, RN  Chest Size (in):  48 Cup Size: n/a  Height: 5\' 7"  (1.702 m)  Weight:  174 lb (78.926 kg)  BMI:  Body mass index is 27.25 kg/(m^2). Tech Comments:  N/A    Nuclear Med Study 1 or 2 day study: 1 day  Stress Test Type:  Lexiscan  Reading MD: Olga Millers, MD  Order Authorizing Provider:  Rollene Rotunda, MD  Resting Radionuclide: Technetium 3m Sestamibi  Resting Radionuclide Dose: 11.0 mCi   Stress Radionuclide:  Technetium 5m Sestamibi  Stress Radionuclide Dose: 33.0 mCi           Stress Protocol Rest HR: 71 Stress HR: 88  Rest BP: 116/64 Stress BP: 126/62  Exercise Time (min): n/a METS: n/a   Predicted Max HR: 162 bpm % Max HR: 54.32 bpm Rate Pressure Product: 46962   Dose of Adenosine (mg):  n/a Dose of Lexiscan: 0.4 mg  Dose of Atropine (mg): n/a Dose of Dobutamine: n/a mcg/kg/min (at max HR)  Stress Test Technologist: Milana Na, EMT-P  Nuclear Technologist:  Domenic Polite, CNMT     Rest Procedure:  Myocardial perfusion imaging was performed at rest 45 minutes following the intravenous administration of Technetium 58m Sestamibi. Rest ECG: NSR with non-specific ST-T wave changes  Stress Procedure:   The patient received IV Lexiscan 0.4 mg over 15-seconds.  Technetium 45m Sestamibi injected at 30-seconds. This patient had sob, chest tightness, and a headache with the Lexiscan injection. Quantitative spect images were obtained after a 45 minute delay. Stress ECG: No significant ST segment change suggestive of ischemia.  QPS Raw Data Images:  Acquisition technically good; normal left ventricular size. Stress Images:  Normal homogeneous uptake in all areas of the myocardium. Rest Images:  Normal homogeneous uptake in all areas of the myocardium. Subtraction (SDS):  No evidence of ischemia. Transient Ischemic Dilatation (Normal <1.22):  1.01 Lung/Heart Ratio (Normal <0.45):  0.37  Quantitative Gated Spect Images QGS EDV:  104 ml QGS ESV:  36 ml  Impression Exercise Capacity:  Lexiscan with no exercise. BP Response:  Normal blood pressure response. Clinical Symptoms:  There is dyspnea and chest tightness. ECG Impression:  No significant ST segment change suggestive of ischemia. Comparison with Prior Nuclear Study: No significant change compared to 09/16/12.  Overall Impression:  Normal stress nuclear study.  LV Ejection Fraction: 65%.  LV Wall Motion:  NL LV Function; NL Wall Motion  Olga Millers

## 2013-11-17 ENCOUNTER — Ambulatory Visit
Admission: RE | Admit: 2013-11-17 | Discharge: 2013-11-17 | Disposition: A | Discharge status: Home / Self Care | Payer: Commercial Managed Care - HMO | Source: Ambulatory Visit | Attending: Radiation Oncology | Admitting: Radiation Oncology

## 2013-11-17 DIAGNOSIS — Z510 Encounter for antineoplastic radiation therapy: Secondary | ICD-10-CM | POA: Diagnosis not present

## 2013-11-19 ENCOUNTER — Telehealth: Payer: Self-pay | Admitting: Internal Medicine

## 2013-11-19 ENCOUNTER — Other Ambulatory Visit (HOSPITAL_BASED_OUTPATIENT_CLINIC_OR_DEPARTMENT_OTHER): Payer: Commercial Managed Care - HMO

## 2013-11-19 ENCOUNTER — Ambulatory Visit
Admission: RE | Admit: 2013-11-19 | Discharge: 2013-11-19 | Disposition: A | Discharge status: Home / Self Care | Payer: Commercial Managed Care - HMO | Source: Ambulatory Visit | Attending: Radiation Oncology | Admitting: Radiation Oncology

## 2013-11-19 ENCOUNTER — Ambulatory Visit (HOSPITAL_BASED_OUTPATIENT_CLINIC_OR_DEPARTMENT_OTHER): Payer: Medicare HMO | Admitting: Nurse Practitioner

## 2013-11-19 VITALS — BP 126/75 | HR 90 | Temp 97.7°F | Resp 20 | Ht 67.0 in | Wt 178.3 lb

## 2013-11-19 DIAGNOSIS — R110 Nausea: Secondary | ICD-10-CM

## 2013-11-19 DIAGNOSIS — Z510 Encounter for antineoplastic radiation therapy: Secondary | ICD-10-CM | POA: Diagnosis not present

## 2013-11-19 DIAGNOSIS — C3490 Malignant neoplasm of unspecified part of unspecified bronchus or lung: Secondary | ICD-10-CM

## 2013-11-19 DIAGNOSIS — R1310 Dysphagia, unspecified: Secondary | ICD-10-CM

## 2013-11-19 DIAGNOSIS — C3492 Malignant neoplasm of unspecified part of left bronchus or lung: Secondary | ICD-10-CM

## 2013-11-19 LAB — CBC WITH DIFFERENTIAL/PLATELET
BASO%: 0.6 % (ref 0.0–2.0)
Eosinophils Absolute: 0 10*3/uL (ref 0.0–0.5)
HCT: 39 % (ref 38.4–49.9)
LYMPH%: 11.5 % — ABNORMAL LOW (ref 14.0–49.0)
MCV: 83.4 fL (ref 79.3–98.0)
MONO#: 0.4 10*3/uL (ref 0.1–0.9)
MONO%: 4.7 % (ref 0.0–14.0)
NEUT#: 6.1 10*3/uL (ref 1.5–6.5)
Platelets: 157 10*3/uL (ref 140–400)
RBC: 4.68 10*6/uL (ref 4.20–5.82)
RDW: 15.2 % — ABNORMAL HIGH (ref 11.0–14.6)
WBC: 7.4 10*3/uL (ref 4.0–10.3)

## 2013-11-19 LAB — BASIC METABOLIC PANEL (CC13)
Anion Gap: 11 mEq/L (ref 3–11)
CO2: 25 mEq/L (ref 22–29)
Calcium: 9.3 mg/dL (ref 8.4–10.4)
Glucose: 130 mg/dl (ref 70–140)
Potassium: 3.7 mEq/L (ref 3.5–5.1)
Sodium: 136 mEq/L (ref 136–145)

## 2013-11-19 MED ORDER — ONDANSETRON HCL 8 MG PO TABS: 8.0000 mg | ORAL_TABLET | Freq: Three times a day (TID) | ORAL | Status: DC | PRN

## 2013-11-19 MED ORDER — PROCHLORPERAZINE MALEATE 10 MG PO TABS: 10.0000 mg | ORAL_TABLET | Freq: Four times a day (QID) | ORAL | Status: DC | PRN

## 2013-11-19 NOTE — Progress Notes (Signed)
OFFICE PROGRESS NOTE  Interval history:  Ernest [name] returns for followup of lung cancer. He is on active treatment with radiation and weekly Taxol/carboplatin. He completed week 3 of the chemotherapy on 11/15/2013.  He reports nausea beginning day 2 and lasting for approximately 3 days. He takes Compazine as needed during this time. He denies mouth sores. No diarrhea or constipation. He has slight intermittent tingling in the fingertips. No numbness. He reports a sore throat and some discomfort with swallowing. He has noted slight hoarseness at times. Appetite is diminished due to an alteration in taste.   Objective: Filed Vitals:   11/19/13 1522  BP: 126/75  Pulse: 90  Temp: 97.7 F (36.5 C)  Resp: 20   Oropharynx is without thrush or ulceration. Lungs are clear. No wheezes or rales. Regular cardiac rhythm. No murmur. Abdomen is soft and nontender. No hepatomegaly. No leg edema. Calves nontender. Motor strength 5 over 5. Knee DTRs 2+, symmetric. Vibratory sense mildly decreased over the fingertips per tuning fork exam.  Lab Results: Lab Results  Component Value Date   WBC 7.4 11/19/2013   HGB 13.0 11/19/2013   HCT 39.0 11/19/2013   MCV 83.4 11/19/2013   PLT 157 11/19/2013   NEUTROABS 6.1 11/19/2013    Chemistry:    Chemistry      Component Value Date/Time   NA 136 11/19/2013 1359   NA 136 10/08/2013 0958   K 3.7 11/19/2013 1359   K 3.1* 10/08/2013 0958   CL 100 10/08/2013 0958   CO2 25 11/19/2013 1359   CO2 29 10/08/2013 0958   BUN 10.8 11/19/2013 1359   BUN 10 10/08/2013 0958   CREATININE 0.8 11/19/2013 1359   CREATININE 0.8 10/08/2013 0958      Component Value Date/Time   CALCIUM 9.3 11/19/2013 1359   CALCIUM 9.5 10/08/2013 0958   ALKPHOS 132 11/01/2013 0905   ALKPHOS 141* 10/01/2013 1400   AST 15 11/01/2013 0905   AST 18 10/01/2013 1400   ALT 18 11/01/2013 0905   ALT 19 10/01/2013 1400   BILITOT 0.47 11/01/2013 0905   BILITOT 0.3 10/01/2013 1400        Studies/Results: No results found.  Medications: I have reviewed the patient's current medications.  Assessment/Plan:  1. 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 Currently receiving concurrent chemotherapy/radiation with weekly Taxol/carboplatin.  2. Nausea following chemotherapy. A prescription was sent to his pharmacy for Zofran 8 mg every 8 hours as needed. 3. Mild pain with swallowing. Question radiation esophagitis. He will discuss with radiation oncology.  Dispositon-he appears stable. He has completed 3 weekly treatments with Taxol/carboplatin. He is scheduled to return for the next weekly chemotherapy on 11/22/2013. He will see Dr. Arbutus Ped in followup on 12/06/2013.   Lonna Cobb ANP/GNP-BC

## 2013-11-19 NOTE — Telephone Encounter (Signed)
Gave pt appt for labs, and MD emailed michelle regarding chem for 11/29/13

## 2013-11-22 ENCOUNTER — Telehealth: Payer: Self-pay | Admitting: *Deleted

## 2013-11-22 ENCOUNTER — Other Ambulatory Visit: Payer: Medicare PPO

## 2013-11-22 ENCOUNTER — Ambulatory Visit
Admission: RE | Admit: 2013-11-22 | Discharge: 2013-11-22 | Disposition: A | Discharge status: Home / Self Care | Payer: Commercial Managed Care - HMO | Source: Ambulatory Visit | Attending: Radiation Oncology | Admitting: Radiation Oncology

## 2013-11-22 ENCOUNTER — Ambulatory Visit: Payer: Medicare HMO | Admitting: Nurse Practitioner

## 2013-11-22 ENCOUNTER — Encounter: Payer: Self-pay | Admitting: Radiation Oncology

## 2013-11-22 ENCOUNTER — Ambulatory Visit: Payer: Commercial Managed Care - HMO | Admitting: Nutrition

## 2013-11-22 ENCOUNTER — Ambulatory Visit (HOSPITAL_BASED_OUTPATIENT_CLINIC_OR_DEPARTMENT_OTHER): Payer: Commercial Managed Care - HMO

## 2013-11-22 VITALS — BP 126/55 | HR 71 | Temp 98.4°F | Resp 18

## 2013-11-22 VITALS — BP 116/67 | HR 72 | Resp 20 | Wt 180.8 lb

## 2013-11-22 DIAGNOSIS — C3490 Malignant neoplasm of unspecified part of unspecified bronchus or lung: Secondary | ICD-10-CM

## 2013-11-22 DIAGNOSIS — C3492 Malignant neoplasm of unspecified part of left bronchus or lung: Secondary | ICD-10-CM

## 2013-11-22 DIAGNOSIS — Z5111 Encounter for antineoplastic chemotherapy: Secondary | ICD-10-CM

## 2013-11-22 DIAGNOSIS — Z510 Encounter for antineoplastic radiation therapy: Secondary | ICD-10-CM | POA: Diagnosis not present

## 2013-11-22 MED ORDER — SUCRALFATE 1 G PO TABS: 1.0000 g | ORAL_TABLET | Freq: Three times a day (TID) | ORAL | Status: DC

## 2013-11-22 MED ORDER — SODIUM CHLORIDE 0.9 % IV SOLN
Freq: Once | INTRAVENOUS | Status: AC
  Administered 2013-11-22: 10:00:00 via INTRAVENOUS

## 2013-11-22 MED ORDER — SODIUM CHLORIDE 0.9 % IV SOLN
45.0000 mg/m2 | Freq: Once | INTRAVENOUS | Status: AC
  Administered 2013-11-22: 90 mg via INTRAVENOUS
  Filled 2013-11-22: qty 15

## 2013-11-22 MED ORDER — DEXAMETHASONE SODIUM PHOSPHATE 20 MG/5ML IJ SOLN
20.0000 mg | Freq: Once | INTRAMUSCULAR | Status: AC
  Administered 2013-11-22: 20 mg via INTRAVENOUS

## 2013-11-22 MED ORDER — DIPHENHYDRAMINE HCL 50 MG/ML IJ SOLN
INTRAMUSCULAR | Status: AC
  Filled 2013-11-22: qty 1

## 2013-11-22 MED ORDER — DEXAMETHASONE SODIUM PHOSPHATE 20 MG/5ML IJ SOLN
INTRAMUSCULAR | Status: AC
  Filled 2013-11-22: qty 5

## 2013-11-22 MED ORDER — ONDANSETRON 16 MG/50ML IVPB (CHCC)
INTRAVENOUS | Status: AC
  Filled 2013-11-22: qty 16

## 2013-11-22 MED ORDER — FAMOTIDINE IN NACL 20-0.9 MG/50ML-% IV SOLN
20.0000 mg | Freq: Once | INTRAVENOUS | Status: AC
  Administered 2013-11-22: 20 mg via INTRAVENOUS

## 2013-11-22 MED ORDER — ONDANSETRON 16 MG/50ML IVPB (CHCC)
16.0000 mg | Freq: Once | INTRAVENOUS | Status: AC
  Administered 2013-11-22: 16 mg via INTRAVENOUS

## 2013-11-22 MED ORDER — SODIUM CHLORIDE 0.9 % IV SOLN
284.0000 mg | Freq: Once | INTRAVENOUS | Status: AC
  Administered 2013-11-22: 280 mg via INTRAVENOUS
  Filled 2013-11-22: qty 28

## 2013-11-22 MED ORDER — FAMOTIDINE IN NACL 20-0.9 MG/50ML-% IV SOLN
INTRAVENOUS | Status: AC
  Filled 2013-11-22: qty 50

## 2013-11-22 MED ORDER — DIPHENHYDRAMINE HCL 50 MG/ML IJ SOLN
50.0000 mg | Freq: Once | INTRAMUSCULAR | Status: AC
  Administered 2013-11-22: 50 mg via INTRAVENOUS

## 2013-11-22 NOTE — Telephone Encounter (Signed)
Per staff message and POF I have scheduled appts. Scheduler notified that lab appt needs to be moved. JMW

## 2013-11-22 NOTE — Progress Notes (Addendum)
Pt in nursing after radiation treatment today c/o sore throat and hoarseness. Pt states his throat has progressively gotten worse over the weekend. He is drinking "lots of liquids" and states he does not have difficulty eating. Pt has gained 3 lbs in past week.  Pt requesting medication "to coat his throat". Pt has not tried any OTC or warm salt water gargles for his sore throat. Pt has Hycodan cough syrup, MS Contin, Percocet for pain. He is taking MS Contin every 12 hours but states he does not take Percocet. He has no other c/o pain except his throat.

## 2013-11-22 NOTE — Progress Notes (Signed)
Nutrition followup completed with patient diagnosed with non-small cell lung cancer receiving concurrent chemoradiation therapy.  Patient's weight decreased to 178.3 pounds on December 26 from 181 pounds December 16.  Patient reports nausea after last chemotherapy treatment beginning on day 2 and lasting 3 days.  M.D. added Zofran and patient reports nausea has now resolved.  He reports increased appetite.  Taste alterations continue.  Patient describes food tasting bland.  Nutrition diagnosis: Unintended weight loss continues.  Intervention: Patient was educated to continue nausea medications as needed and prescribed by M.D.  Patient educated to continue small frequent meals with oral nutrition supplements as needed.  I've educated patient on strategies for improving taste alterations.  Teach back method used.  Fact sheets provided.  Monitoring, evaluation, goals: Patient will tolerate increased calories and protein to minimize further weight loss.  Next visit: Monday, January 5, during chemotherapy.

## 2013-11-22 NOTE — Patient Instructions (Signed)
[facility] Discharge Instructions for Patients Receiving Chemotherapy  Today you received the following chemotherapy agents Taxol/Carboplatin  To help prevent nausea and vomiting after your treatment, we encourage you to take your nausea medication as directed.   If you develop nausea and vomiting that is not controlled by your nausea medication, call the clinic.   BELOW ARE SYMPTOMS THAT SHOULD BE REPORTED IMMEDIATELY:  *FEVER GREATER THAN 100.5 F  *CHILLS WITH OR WITHOUT FEVER  NAUSEA AND VOMITING THAT IS NOT CONTROLLED WITH YOUR NAUSEA MEDICATION  *UNUSUAL SHORTNESS OF BREATH  *UNUSUAL BRUISING OR BLEEDING  TENDERNESS IN MOUTH AND THROAT WITH OR WITHOUT PRESENCE OF ULCERS  *URINARY PROBLEMS  *BOWEL PROBLEMS  UNUSUAL RASH Items with * indicate a potential emergency and should be followed up as soon as possible.  Feel free to call the clinic you have any questions or concerns. The clinic phone number is [phone].    

## 2013-11-22 NOTE — Progress Notes (Signed)
  Radiation Oncology         [phone] ________________________________  Name: Ernest [name] MRN: [id number]  Date: 11/22/2013  DOB: 14-Sep-1956  Weekly Radiation Therapy Management  Current Dose: 16 Gy     Planned Dose:  66 Gy  Narrative . . . . . . . . The patient presents in nursing after radiation treatment today c/o sore throat and hoarseness. Pt states his throat has progressively gotten worse over the weekend. He is drinking "lots of liquids" and states he does not have difficulty eating. Pt has gained 3 lbs in past week. Pt requesting medication "to coat his throat". Pt has not tried any OTC or warm salt water gargles for his sore throat. Pt has Hycodan cough syrup, MS Contin, Percocet for pain. He is taking MS Contin every 12 hours but states he does not take Percocet. He has no other c/o pain except his throat                                   The patient is without complaint.                                 Set-up films were reviewed.                                 The chart was checked. Physical Findings. . .  weight is 180 lb 12.8 oz (82.01 kg). His blood pressure is 116/67 and his pulse is 72. His respiration is 20 and oxygen saturation is 96%. . Weight is up.  No significant changes. Impression . . . . . . . The patient is tolerating radiation. Plan . . . . . . . . . . . . Continue treatment as planned.  Given Carafate.  ________________________________  Artist Pais Kathrynn Running, M.D.

## 2013-11-23 ENCOUNTER — Ambulatory Visit
Admission: RE | Admit: 2013-11-23 | Discharge: 2013-11-23 | Disposition: A | Discharge status: Home / Self Care | Payer: Commercial Managed Care - HMO | Source: Ambulatory Visit | Attending: Radiation Oncology | Admitting: Radiation Oncology

## 2013-11-23 ENCOUNTER — Ambulatory Visit: Payer: Commercial Managed Care - HMO | Admitting: Radiation Oncology

## 2013-11-23 DIAGNOSIS — Z510 Encounter for antineoplastic radiation therapy: Secondary | ICD-10-CM | POA: Diagnosis not present

## 2013-11-24 ENCOUNTER — Ambulatory Visit
Admission: RE | Admit: 2013-11-24 | Discharge: 2013-11-24 | Disposition: A | Discharge status: Home / Self Care | Payer: Commercial Managed Care - HMO | Source: Ambulatory Visit | Attending: Radiation Oncology | Admitting: Radiation Oncology

## 2013-11-24 DIAGNOSIS — Z510 Encounter for antineoplastic radiation therapy: Secondary | ICD-10-CM | POA: Diagnosis not present

## 2013-11-26 ENCOUNTER — Ambulatory Visit
Admission: RE | Admit: 2013-11-26 | Discharge: 2013-11-26 | Disposition: A | Discharge status: Home / Self Care | Payer: Commercial Managed Care - HMO | Source: Ambulatory Visit | Attending: Radiation Oncology | Admitting: Radiation Oncology

## 2013-11-26 DIAGNOSIS — Z510 Encounter for antineoplastic radiation therapy: Secondary | ICD-10-CM | POA: Diagnosis not present

## 2013-11-29 ENCOUNTER — Ambulatory Visit: Payer: Commercial Managed Care - HMO | Admitting: Nutrition

## 2013-11-29 ENCOUNTER — Telehealth: Payer: Self-pay | Admitting: Internal Medicine

## 2013-11-29 ENCOUNTER — Ambulatory Visit (HOSPITAL_BASED_OUTPATIENT_CLINIC_OR_DEPARTMENT_OTHER): Payer: Medicare HMO

## 2013-11-29 ENCOUNTER — Other Ambulatory Visit (HOSPITAL_BASED_OUTPATIENT_CLINIC_OR_DEPARTMENT_OTHER): Payer: Commercial Managed Care - HMO

## 2013-11-29 ENCOUNTER — Ambulatory Visit
Admission: RE | Admit: 2013-11-29 | Discharge: 2013-11-29 | Disposition: A | Discharge status: Home / Self Care | Payer: Commercial Managed Care - HMO | Source: Ambulatory Visit | Attending: Radiation Oncology | Admitting: Radiation Oncology

## 2013-11-29 VITALS — BP 134/76 | HR 68 | Temp 97.6°F | Wt 178.3 lb

## 2013-11-29 VITALS — BP 121/59 | HR 67 | Temp 98.3°F | Resp 20

## 2013-11-29 DIAGNOSIS — C3492 Malignant neoplasm of unspecified part of left bronchus or lung: Secondary | ICD-10-CM

## 2013-11-29 DIAGNOSIS — Z5111 Encounter for antineoplastic chemotherapy: Secondary | ICD-10-CM

## 2013-11-29 DIAGNOSIS — C3410 Malignant neoplasm of upper lobe, unspecified bronchus or lung: Secondary | ICD-10-CM

## 2013-11-29 DIAGNOSIS — Z510 Encounter for antineoplastic radiation therapy: Secondary | ICD-10-CM | POA: Diagnosis not present

## 2013-11-29 LAB — BASIC METABOLIC PANEL (CC13)
Anion Gap: 12 mEq/L — ABNORMAL HIGH (ref 3–11)
BUN: 8.6 mg/dL (ref 7.0–26.0)
CALCIUM: 9.5 mg/dL (ref 8.4–10.4)
CHLORIDE: 103 meq/L (ref 98–109)
CO2: 25 meq/L (ref 22–29)
Creatinine: 0.7 mg/dL (ref 0.7–1.3)
Glucose: 124 mg/dl (ref 70–140)
Potassium: 3.7 mEq/L (ref 3.5–5.1)
Sodium: 140 mEq/L (ref 136–145)

## 2013-11-29 LAB — CBC WITH DIFFERENTIAL/PLATELET
BASO%: 0.4 % (ref 0.0–2.0)
Basophils Absolute: 0 10*3/uL (ref 0.0–0.1)
EOS ABS: 0.1 10*3/uL (ref 0.0–0.5)
EOS%: 1.5 % (ref 0.0–7.0)
HCT: 39 % (ref 38.4–49.9)
HGB: 12.8 g/dL — ABNORMAL LOW (ref 13.0–17.1)
LYMPH%: 15.7 % (ref 14.0–49.0)
MCH: 27.9 pg (ref 27.2–33.4)
MCHC: 32.8 g/dL (ref 32.0–36.0)
MCV: 85.2 fL (ref 79.3–98.0)
MONO#: 0.4 10*3/uL (ref 0.1–0.9)
MONO%: 8.2 % (ref 0.0–14.0)
NEUT%: 74.2 % (ref 39.0–75.0)
NEUTROS ABS: 3.5 10*3/uL (ref 1.5–6.5)
NRBC: 0 % (ref 0–0)
Platelets: 89 10*3/uL — ABNORMAL LOW (ref 140–400)
RBC: 4.58 10*6/uL (ref 4.20–5.82)
RDW: 15.8 % — AB (ref 11.0–14.6)
WBC: 4.8 10*3/uL (ref 4.0–10.3)
lymph#: 0.8 10*3/uL — ABNORMAL LOW (ref 0.9–3.3)

## 2013-11-29 MED ORDER — SODIUM CHLORIDE 0.9 % IV SOLN
284.0000 mg | Freq: Once | INTRAVENOUS | Status: AC
  Administered 2013-11-29: 280 mg via INTRAVENOUS
  Filled 2013-11-29: qty 28

## 2013-11-29 MED ORDER — FAMOTIDINE IN NACL 20-0.9 MG/50ML-% IV SOLN
20.0000 mg | Freq: Once | INTRAVENOUS | Status: AC
  Administered 2013-11-29: 20 mg via INTRAVENOUS

## 2013-11-29 MED ORDER — DEXAMETHASONE SODIUM PHOSPHATE 20 MG/5ML IJ SOLN
20.0000 mg | Freq: Once | INTRAMUSCULAR | Status: AC
  Administered 2013-11-29: 20 mg via INTRAVENOUS

## 2013-11-29 MED ORDER — PACLITAXEL CHEMO INJECTION 300 MG/50ML
45.0000 mg/m2 | Freq: Once | INTRAVENOUS | Status: AC
  Administered 2013-11-29: 90 mg via INTRAVENOUS
  Filled 2013-11-29: qty 15

## 2013-11-29 MED ORDER — ONDANSETRON 16 MG/50ML IVPB (CHCC)
INTRAVENOUS | Status: AC
  Filled 2013-11-29: qty 16

## 2013-11-29 MED ORDER — SODIUM CHLORIDE 0.9 % IV SOLN
Freq: Once | INTRAVENOUS | Status: AC
  Administered 2013-11-29: 13:00:00 via INTRAVENOUS

## 2013-11-29 MED ORDER — DIPHENHYDRAMINE HCL 50 MG/ML IJ SOLN
50.0000 mg | Freq: Once | INTRAMUSCULAR | Status: AC
  Administered 2013-11-29: 50 mg via INTRAVENOUS

## 2013-11-29 MED ORDER — DIPHENHYDRAMINE HCL 50 MG/ML IJ SOLN
INTRAMUSCULAR | Status: AC
  Filled 2013-11-29: qty 1

## 2013-11-29 MED ORDER — ONDANSETRON 16 MG/50ML IVPB (CHCC)
16.0000 mg | Freq: Once | INTRAVENOUS | Status: AC
  Administered 2013-11-29: 16 mg via INTRAVENOUS

## 2013-11-29 MED ORDER — FAMOTIDINE IN NACL 20-0.9 MG/50ML-% IV SOLN
INTRAVENOUS | Status: AC
  Filled 2013-11-29: qty 50

## 2013-11-29 MED ORDER — DEXAMETHASONE SODIUM PHOSPHATE 20 MG/5ML IJ SOLN
INTRAMUSCULAR | Status: AC
  Filled 2013-11-29: qty 5

## 2013-11-29 NOTE — Progress Notes (Signed)
Patient for weekly assessment of radiation to left lung.Denies pain.Mild shortness of breath unchanged from usual.N cough.Has some fatigue.Completed 11 of 33 treatments.Voices no skin changes.

## 2013-11-29 NOTE — Telephone Encounter (Signed)
Called pt and left message regarding lab,md and chemo for January 12th  2015

## 2013-11-29 NOTE — Progress Notes (Signed)
1210-OK to treat with platelet count of 89 per [name].

## 2013-11-29 NOTE — Progress Notes (Signed)
Patient reports he feels well.  His nausea has improved.  His appetite has increased.  And his weight is also increased to 180.8 pounds December 29 from 178.3 pounds December 26.  Patient has doubled his snacking between meals.  He drinks ensure 3 times a day along with [employer].  He has no nutrition complaints.  Nutrition diagnosis: Unintended weight loss improved.  Intervention: Patient was educated to continue nausea medications as needed and prescribed by M.D.  Patient to continue small frequent meals with oral nutrition supplements.  Additional coupons provided.  Teach back method used.  Monitoring, evaluation, goals: Patient has tolerated increased calories and protein, and has gained 2 and half pounds.  Next visit: Monday, January 12, during chemotherapy.

## 2013-11-29 NOTE — Patient Instructions (Signed)
[facility] Discharge Instructions for Patients Receiving Chemotherapy   Today you received the following chemotherapy agents: taxol, carboplatin  To help prevent nausea and vomiting after your treatment, we encourage you to take your nausea medication.  Take it as often as prescribed.     If you develop nausea and vomiting that is not controlled by your nausea medication, call the clinic. If it is after clinic hours your family physician or the after hours number for the clinic or go to the Emergency Department.   BELOW ARE SYMPTOMS THAT SHOULD BE REPORTED IMMEDIATELY:  *FEVER GREATER THAN 100.5 F  *CHILLS WITH OR WITHOUT FEVER  NAUSEA AND VOMITING THAT IS NOT CONTROLLED WITH YOUR NAUSEA MEDICATION  *UNUSUAL SHORTNESS OF BREATH  *UNUSUAL BRUISING OR BLEEDING  TENDERNESS IN MOUTH AND THROAT WITH OR WITHOUT PRESENCE OF ULCERS  *URINARY PROBLEMS  *BOWEL PROBLEMS  UNUSUAL RASH Items with * indicate a potential emergency and should be followed up as soon as possible.  Feel free to call the clinic you have any questions or concerns. The clinic phone number is [phone].   I have been informed and understand all the instructions given to me. I know to contact the clinic, my physician, or go to the Emergency Department if any problems should occur. I do not have any questions at this time, but understand that I may call the clinic during office hours   should I have any questions or need assistance in obtaining follow up care.    __________________________________________  _____________  __________ Signature of Patient or Authorized Representative            Date                   Time    __________________________________________ Nurse's Signature

## 2013-11-29 NOTE — Progress Notes (Signed)
Weekly Management Note:  Site: Left lung Current Dose:  2400  cGy Projected Dose: 6600  cGy  Narrative: The patient is seen today for routine under treatment assessment. CBCT/MVCT images/port films were reviewed. The chart was reviewed.   He states that his swallowing is improved with Carafate. I reviewed his dosimetry and I really do not expect significant esophagitis. Chemotherapy is going well. His weight is stable and he is eating well. He believes that his breathing may be slightly improved.  Physical Examination:  Filed Vitals:   11/29/13 1629  BP: 134/76  Pulse: 68  Temp: 97.6 F (36.4 C)  .  Weight: 178 lb 4.8 oz (80.876 kg). No significant skin changes. Breath sounds are distant, otherwise clear.  Laboratory data: Lab Results  Component Value Date   WBC 4.8 11/29/2013   HGB 12.8* 11/29/2013   HCT 39.0 11/29/2013   MCV 85.2 11/29/2013   PLT 89* 11/29/2013     Impression: Tolerating radiation therapy well.  Plan: Continue radiation therapy as planned.

## 2013-11-30 ENCOUNTER — Ambulatory Visit
Admission: RE | Admit: 2013-11-30 | Discharge: 2013-11-30 | Disposition: A | Discharge status: Home / Self Care | Payer: Commercial Managed Care - HMO | Source: Ambulatory Visit | Attending: Radiation Oncology | Admitting: Radiation Oncology

## 2013-11-30 DIAGNOSIS — Z510 Encounter for antineoplastic radiation therapy: Secondary | ICD-10-CM | POA: Diagnosis not present

## 2013-12-01 ENCOUNTER — Ambulatory Visit
Admission: RE | Admit: 2013-12-01 | Discharge: 2013-12-01 | Disposition: A | Discharge status: Home / Self Care | Payer: Commercial Managed Care - HMO | Source: Ambulatory Visit | Attending: Radiation Oncology | Admitting: Radiation Oncology

## 2013-12-01 DIAGNOSIS — Z510 Encounter for antineoplastic radiation therapy: Secondary | ICD-10-CM | POA: Diagnosis not present

## 2013-12-02 ENCOUNTER — Ambulatory Visit
Admission: RE | Admit: 2013-12-02 | Discharge: 2013-12-02 | Disposition: A | Discharge status: Home / Self Care | Payer: Commercial Managed Care - HMO | Source: Ambulatory Visit | Attending: Radiation Oncology | Admitting: Radiation Oncology

## 2013-12-02 DIAGNOSIS — Z510 Encounter for antineoplastic radiation therapy: Secondary | ICD-10-CM | POA: Diagnosis not present

## 2013-12-03 ENCOUNTER — Ambulatory Visit
Admission: RE | Admit: 2013-12-03 | Discharge: 2013-12-03 | Disposition: A | Discharge status: Home / Self Care | Payer: Commercial Managed Care - HMO | Source: Ambulatory Visit | Attending: Radiation Oncology | Admitting: Radiation Oncology

## 2013-12-03 DIAGNOSIS — Z510 Encounter for antineoplastic radiation therapy: Secondary | ICD-10-CM | POA: Diagnosis not present

## 2013-12-06 ENCOUNTER — Ambulatory Visit: Payer: Medicare HMO

## 2013-12-06 ENCOUNTER — Encounter: Payer: Medicare HMO | Admitting: Nutrition

## 2013-12-06 ENCOUNTER — Ambulatory Visit (HOSPITAL_BASED_OUTPATIENT_CLINIC_OR_DEPARTMENT_OTHER): Payer: Medicare HMO | Admitting: Internal Medicine

## 2013-12-06 ENCOUNTER — Ambulatory Visit
Admission: RE | Admit: 2013-12-06 | Discharge: 2013-12-06 | Disposition: A | Discharge status: Home / Self Care | Payer: Commercial Managed Care - HMO | Source: Ambulatory Visit | Attending: Radiation Oncology | Admitting: Radiation Oncology

## 2013-12-06 ENCOUNTER — Other Ambulatory Visit: Payer: Commercial Managed Care - HMO

## 2013-12-06 ENCOUNTER — Encounter: Payer: Self-pay | Admitting: Internal Medicine

## 2013-12-06 ENCOUNTER — Telehealth: Payer: Self-pay | Admitting: Internal Medicine

## 2013-12-06 ENCOUNTER — Other Ambulatory Visit: Payer: Self-pay | Admitting: Internal Medicine

## 2013-12-06 ENCOUNTER — Ambulatory Visit: Payer: Medicare HMO | Admitting: Physician Assistant

## 2013-12-06 DIAGNOSIS — C3410 Malignant neoplasm of upper lobe, unspecified bronchus or lung: Secondary | ICD-10-CM

## 2013-12-06 DIAGNOSIS — C3492 Malignant neoplasm of unspecified part of left bronchus or lung: Secondary | ICD-10-CM

## 2013-12-06 DIAGNOSIS — Z510 Encounter for antineoplastic radiation therapy: Secondary | ICD-10-CM | POA: Diagnosis not present

## 2013-12-06 DIAGNOSIS — C3490 Malignant neoplasm of unspecified part of unspecified bronchus or lung: Secondary | ICD-10-CM

## 2013-12-06 LAB — CBC WITH DIFFERENTIAL/PLATELET
BASO%: 0.2 % (ref 0.0–2.0)
Basophils Absolute: 0 10*3/uL (ref 0.0–0.1)
EOS%: 1.2 % (ref 0.0–7.0)
Eosinophils Absolute: 0.1 10*3/uL (ref 0.0–0.5)
HCT: 37 % — ABNORMAL LOW (ref 38.4–49.9)
HGB: 12 g/dL — ABNORMAL LOW (ref 13.0–17.1)
LYMPH#: 0.7 10*3/uL — AB (ref 0.9–3.3)
LYMPH%: 16.9 % (ref 14.0–49.0)
MCH: 27.8 pg (ref 27.2–33.4)
MCHC: 32.4 g/dL (ref 32.0–36.0)
MCV: 85.6 fL (ref 79.3–98.0)
MONO#: 0.3 10*3/uL (ref 0.1–0.9)
MONO%: 7.7 % (ref 0.0–14.0)
NEUT#: 3 10*3/uL (ref 1.5–6.5)
NEUT%: 74 % (ref 39.0–75.0)
Platelets: 84 10*3/uL — ABNORMAL LOW (ref 140–400)
RBC: 4.32 10*6/uL (ref 4.20–5.82)
RDW: 16.5 % — AB (ref 11.0–14.6)
WBC: 4 10*3/uL (ref 4.0–10.3)
nRBC: 0 % (ref 0–0)

## 2013-12-06 NOTE — Progress Notes (Addendum)
Weekly rad txs, lt lung 17/33 txs, sob on occasion, 97% room air, no chemotherapy today plts=84, saw Pamala Hurry neff last week, , does get food stuck occasionally, eating mostly soft foods, saw Dr.Mohamed this am, slight erythema left chest , skin intact, energy level poor, takes naps end of the day, drinks plenty water stated patient,using baifine cream daily 3:38 PM

## 2013-12-06 NOTE — Patient Instructions (Signed)
Followup visit in 2 weeks. 

## 2013-12-06 NOTE — Progress Notes (Signed)
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  PRIOR THERAPY: None  CURRENT THERAPY: Concurrent chemoradiation with weekly carboplatin for AUC of 2 and paclitaxel 45 mg/M2, status post 5 cycles. First dose on 11/01/2013.  CHEMOTHERAPY INTENT: Curative/control  CURRENT # OF CHEMOTHERAPY CYCLES: 6   CURRENT ANTIEMETICS: Zofran, dexamethasone and Compazine  CURRENT SMOKING STATUS: Former smoker  ORAL CHEMOTHERAPY AND CONSENT: None  CURRENT BISPHOSPHONATES USE: None  PAIN MANAGEMENT: 0/10  NARCOTICS INDUCED CONSTIPATION: None.  LIVING WILL AND CODE STATUS: Full code.   INTERVAL HISTORY: Ernest [name] 58 y.o. male returns to the clinic today for followup visit accompanied by his wife. The patient is feeling fine today with no specific complaints. He tolerated the last dose of his concurrent chemoradiation fairly well with no significant adverse effects. His weight is stable. The patient denied having any significant chest pain, shortness of breath, cough or hemoptysis. He has no nausea or vomiting. He denied having any significant fever or chills. He is expected to finish his radiotherapy on 12/28/2013.  MEDICAL HISTORY: Past Medical History  Diagnosis Date  . CAD (coronary artery disease)     Left Main 30% stenosis, LAD 20 - 30 % stenosis, first and second diagonal branchesat 40 - 50%  stenosis with small arteries, circumflex had 30% stenosis in the large obtuse marginal, RCA at 70 - 80%  stenosis [not felt to be occlusive after evaluation with flow wire], distal 50 - 60% stenosis - [name][  . COPD (chronic obstructive pulmonary disease)     Dr. Baird Lyons  . Depression   . Anxiety   . Hyperlipidemia   . Chronic insomnia   . Gout   . GERD (gastroesophageal reflux disease)   . PVD (peripheral vascular disease)     (ABI 0.9 on right and 0.89 on left)  severe left external iliac stenosis.  He  had successful stenting of his left external iliac per Dr. Burt Knack.  . DDD (degenerative disc disease)   . Hx of colonoscopy   . 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 Cancer     lung  . OSA (obstructive sleep apnea)     NPSG 09/10/10- AHI 11.3/hr  . Hypertension     dr Percival Spanish  . Lung cancer 10/04/13    LUL squamous cell lung cancer    ALLERGIES:  has No Known Allergies.  MEDICATIONS:  Current Outpatient Prescriptions  Medication Sig Dispense Refill  . albuterol (PROVENTIL HFA;VENTOLIN HFA) 108 (90 BASE) MCG/ACT inhaler Inhale 2 puffs into the lungs every 4 (four) hours as needed for wheezing or shortness of breath.      [name] albuterol (PROVENTIL) (2.5 MG/3ML) 0.083% nebulizer solution Take 2.5 mg by nebulization every 4 (four) hours as needed for wheezing or shortness of breath.      . ALPRAZolam (XANAX) 0.25 MG tablet Take 1 tablet (0.25 mg total) by mouth 2 (two) times daily as needed for anxiety.  30 tablet  0  . amLODipine (NORVASC) 10 MG tablet TAKE 1 TABLET (10 MG TOTAL) BY MOUTH DAILY.  30 tablet  6  . ARIPiprazole (ABILIFY) 5 MG tablet Take 1 tablet (5 mg total) by mouth at bedtime.      [name]  atorvastatin (LIPITOR) 20 MG tablet Take 1 tablet (20 mg total) by mouth daily.  90 tablet  3  . carvedilol (COREG) 25 MG tablet Take 1 tablet (25 mg total) by mouth 2 (two) times daily with a meal.  180 tablet  3  . clonazePAM (KLONOPIN) 1 MG tablet Take 1 mg by mouth at bedtime as needed. For anxiety/insomnia      . doxycycline (VIBRA-TABS) 100 MG tablet Take 1 tablet (100 mg total) by mouth 2 (two) times daily.  20 tablet  0  . esomeprazole (NEXIUM) 40 MG capsule Take 1 capsule (40 mg total) by mouth daily before breakfast.  90 capsule  3  .  FLUoxetine (PROZAC) 40 MG capsule Take 40 mg by mouth daily.        . Fluticasone-Salmeterol (ADVAIR DISKUS) 250-50 MCG/DOSE AEPB Inhale 1 puff into the lungs 2 (two) times daily.  1 each  3  . gabapentin (NEURONTIN) 100 MG capsule Take 100 mg by mouth daily.       [name] HYDROcodone-homatropine (HYCODAN) 5-1.5 MG/5ML syrup Take 5 mLs by mouth every 6 (six) hours as needed for cough.  240 mL  0  . Indacaterol Maleate (ARCAPTA NEOHALER) 75 MCG CAPS Place 1 capsule into inhaler and inhale daily.      . isosorbide mononitrate (IMDUR) 30 MG 24 hr tablet Take 1 tablet (30 mg total) by mouth daily.  30 tablet  3  . losartan (COZAAR) 25 MG tablet Take 1 tablet (25 mg total) by mouth daily.  90 tablet  3  . methocarbamol (ROBAXIN) 500 MG tablet Take 500 mg by mouth 2 (two) times daily.       . mirtazapine (REMERON) 15 MG tablet Take 1.5 tablets (22.5 mg total) by mouth at bedtime.  90 tablet  1  . modafinil (PROVIGIL) 200 MG tablet Take 200 mg by mouth daily.       [name] morphine (MS CONTIN) 60 MG 12 hr tablet Take 60 mg by mouth 2 (two) times daily.        . nitroGLYCERIN (NITROSTAT) 0.4 MG SL tablet Place 1 tablet (0.4 mg total) under the tongue every 5 (five) minutes as needed for chest pain.  25 tablet  3  . Omega-3 Fatty Acids (FISH OIL CONCENTRATE) 1000 MG CAPS Take 1,000 mg by mouth daily.       . ondansetron (ZOFRAN) 8 MG tablet Take 1 tablet (8 mg total) by mouth every 8 (eight) hours as needed for nausea or vomiting.  20 tablet  0  . oxyCODONE-acetaminophen (PERCOCET) 10-325 MG per tablet Take 1 tablet by mouth 3 (three) times daily as needed. For pain      . potassium chloride SA (K-DUR,KLOR-CON) 20 MEQ tablet Take 20 mEq by mouth daily.      . prochlorperazine (COMPAZINE) 10 MG tablet Take 1 tablet (10 mg total) by mouth every 6 (six) hours as needed for nausea or vomiting.  60 tablet  1  . sucralfate (CARAFATE) 1 G tablet Take 1 tablet (1 g total) by mouth 4 (four) times daily -  with meals and at  bedtime. Take 5 min before eating  120 tablet  5  . VOLTAREN 1 % GEL Apply 2 g topically daily as needed (for pain).        No current facility-administered medications for this visit.    SURGICAL HISTORY:  Past Surgical History  Procedure Laterality Date  . Spinal fusion  03/05/2007    L4-L5  .  Hip surgery      left  . Arm surgery      left  . Shoulder surgery      right  . C-spine surgery    . Angioplasty    . Video bronchoscopy with endobronchial navigation N/A 10/04/2013    Procedure: VIDEO BRONCHOSCOPY WITH ENDOBRONCHIAL NAVIGATION;  Surgeon: Collene Gobble, MD;  Location: [facility] OR;  Service: Thoracic;  Laterality: N/A;  . Back surgery      REVIEW OF SYSTEMS:  A comprehensive review of systems was negative.   PHYSICAL EXAMINATION: General appearance: alert, cooperative and no distress Head: Normocephalic, without obvious abnormality, atraumatic Neck: no adenopathy, no JVD, supple, symmetrical, trachea midline and thyroid not enlarged, symmetric, no tenderness/mass/nodules Lymph nodes: Cervical, supraclavicular, and axillary nodes normal. Resp: clear to auscultation bilaterally Back: symmetric, no curvature. ROM normal. No CVA tenderness. Cardio: regular rate and rhythm, S1, S2 normal, no murmur, click, rub or gallop GI: soft, non-tender; bowel sounds normal; no masses,  no organomegaly Extremities: extremities normal, atraumatic, no cyanosis or edema  ECOG PERFORMANCE STATUS: 0 - Asymptomatic  Blood pressure 126/61, pulse 77, temperature 98.2 F (36.8 C), temperature source Oral, resp. rate 18, height 5\' 7"  (1.702 m), weight 181 lb 3.2 oz (82.192 kg).  LABORATORY DATA: Lab Results  Component Value Date   WBC 4.8 11/29/2013   HGB 12.8* 11/29/2013   HCT 39.0 11/29/2013   MCV 85.2 11/29/2013   PLT 89* 11/29/2013      Chemistry      Component Value Date/Time   NA 140 11/29/2013 1038   NA 136 10/08/2013 0958   K 3.7 11/29/2013 1038   K 3.1* 10/08/2013 0958   CL 100 10/08/2013  0958   CO2 25 11/29/2013 1038   CO2 29 10/08/2013 0958   BUN 8.6 11/29/2013 1038   BUN 10 10/08/2013 0958   CREATININE 0.7 11/29/2013 1038   CREATININE 0.8 10/08/2013 0958      Component Value Date/Time   CALCIUM 9.5 11/29/2013 1038   CALCIUM 9.5 10/08/2013 0958   ALKPHOS 132 11/01/2013 0905   ALKPHOS 141* 10/01/2013 1400   AST 15 11/01/2013 0905   AST 18 10/01/2013 1400   ALT 18 11/01/2013 0905   ALT 19 10/01/2013 1400   BILITOT 0.47 11/01/2013 0905   BILITOT 0.3 10/01/2013 1400       RADIOGRAPHIC STUDIES:  ASSESSMENT AND PLAN: This is a very pleasant 58 years old white male with stage IIIA non-small cell lung cancer, squamous cell carcinoma currently undergoing concurrent chemoradiation with weekly carboplatin and paclitaxel status post 5 weeks of treatment. He is tolerating his treatment fairly well with no significant adverse effects. His platelet counts are low today. I would discontinue his dose of the chemotherapy today. He'll resume his treatment next week after improvement in his platelet count.  He would come back for followup visit in 2 weeks for evaluation and management any adverse effect of his treatment. He was advised to call immediately if he has any concerning symptoms in the interval. The patient voices understanding of current disease status and treatment options and is in agreement with the current care plan.  All questions were answered. The patient knows to call the clinic with any problems, questions or concerns. We can certainly see the patient much sooner if necessary.  I spent 10 minutes counseling the patient face to face. The total time spent in the appointment was 15 minutes.

## 2013-12-06 NOTE — Telephone Encounter (Signed)
gv pt appt schedule for january. care plan ends 1/26 and per pt he only has 2 tx's left.

## 2013-12-06 NOTE — Progress Notes (Signed)
Weekly Management Note:  Site: Left lung Current Dose:  3400  cGy Projected Dose: 6600  cGy  Narrative: The patient is seen today for routine under treatment assessment. CBCT/MVCT images/port films were reviewed. The chart was reviewed.   He is without complaints today. No dysphagia. He is eating well. He states that his dyspnea is slightly improved. Chemotherapy is being held today because of thrombocytopenia. His MV CT on [facility] shows slight tumor regression.  Physical Examination: Alert and oriented. Wt Readings from Last 3 Encounters:  12/06/13 181 lb 3.2 oz (82.192 kg)  11/29/13 178 lb 4.8 oz (80.876 kg)  11/22/13 180 lb 12.8 oz (82.01 kg)   Temp Readings from Last 3 Encounters:  12/06/13 98.2 F (36.8 C) Oral  11/29/13 97.6 F (36.4 C)   11/29/13 98.3 F (36.8 C) Oral   BP Readings from Last 3 Encounters:  12/06/13 126/61  11/29/13 134/76  11/29/13 121/59   Pulse Readings from Last 3 Encounters:  12/06/13 77  11/29/13 68  11/29/13 67   No change. Breath sounds remain distant.  Laboratory data: Lab Results  Component Value Date   WBC 4.0 12/06/2013   HGB 12.0* 12/06/2013   HCT 37.0* 12/06/2013   MCV 85.6 12/06/2013   PLT 84* 12/06/2013     Impression: Tolerating radiation therapy well.  Plan: Continue radiation therapy as planned.

## 2013-12-07 ENCOUNTER — Ambulatory Visit
Admission: RE | Admit: 2013-12-07 | Discharge: 2013-12-07 | Disposition: A | Discharge status: Home / Self Care | Payer: Commercial Managed Care - HMO | Source: Ambulatory Visit | Attending: Radiation Oncology | Admitting: Radiation Oncology

## 2013-12-07 ENCOUNTER — Ambulatory Visit (INDEPENDENT_AMBULATORY_CARE_PROVIDER_SITE_OTHER): Payer: Commercial Managed Care - HMO | Admitting: Nurse Practitioner

## 2013-12-07 ENCOUNTER — Encounter: Payer: Self-pay | Admitting: Nurse Practitioner

## 2013-12-07 VITALS — BP 130/70 | HR 78 | Ht 67.0 in | Wt 183.8 lb

## 2013-12-07 DIAGNOSIS — Z510 Encounter for antineoplastic radiation therapy: Secondary | ICD-10-CM | POA: Diagnosis not present

## 2013-12-07 DIAGNOSIS — I2510 Atherosclerotic heart disease of native coronary artery without angina pectoris: Secondary | ICD-10-CM

## 2013-12-07 NOTE — Patient Instructions (Signed)
Your stress test looked good  Stay on your current medicines  See me in 4 months  Call the [place] office at [phone] if you have any questions, problems or concerns.

## 2013-12-07 NOTE — Progress Notes (Signed)
Ernest [name] Date of Birth: [birth year] Medical Record #[id number]  History of Present Illness: Mr. Deruiter is seen today for a follow up visit. Seen for Dr. Percival Spanish. He has known CAD with prior cath and PVD with past left iliac stenting. Other issues include HTN, HLD, COPD gout, GERD, anxiety and depression. He is a past smoker. 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 He is currently undergoing chemoradiation.   Seen a month ago with chest pressure with radiation to the left arm - not sure if this was from his cancer or was his CAD. Was being actively treated for his lung cancer. We updated his Myoview - this looked ok. Did start him on Imdur. Dr. Percival Spanish was hoping for a conservative approach.  Comes back today. Here alone. Doing better. Has had 5 chemo treatments. Still getting his radiation. Chest pain is resolved. Feels ok on the Imdur. Actually doing ok and has felt pretty good over the past couple of weeks. Counts were low earlier this week and his treatment is on hold.   Current Outpatient Prescriptions  Medication Sig Dispense Refill  . albuterol (PROVENTIL HFA;VENTOLIN HFA) 108 (90 BASE) MCG/ACT inhaler Inhale 2 puffs into the lungs every 4 (four) hours as needed for wheezing or shortness of breath.      [name] albuterol (PROVENTIL) (2.5 MG/3ML) 0.083% nebulizer solution Take 2.5 mg by nebulization every 4 (four) hours as needed for wheezing or shortness of breath.      . ALPRAZolam (XANAX) 0.25 MG tablet Take 1 tablet (0.25 mg total) by mouth 2 (two) times daily as needed for anxiety.  30 tablet  0  . amLODipine (NORVASC) 10 MG tablet TAKE 1 TABLET (10 MG TOTAL) BY MOUTH DAILY.  30 tablet  6  . ARIPiprazole (ABILIFY) 5 MG tablet Take 1 tablet (5 mg total) by mouth at bedtime.      [name] atorvastatin (LIPITOR) 20 MG tablet  Take 1 tablet (20 mg total) by mouth daily.  90 tablet  3  . carvedilol (COREG) 25 MG tablet Take 1 tablet (25 mg total) by mouth 2 (two) times daily with a meal.  180 tablet  3  . clonazePAM (KLONOPIN) 1 MG tablet Take 1 mg by mouth at bedtime as needed. For anxiety/insomnia      . esomeprazole (NEXIUM) 40 MG capsule Take 1 capsule (40 mg total) by mouth daily before breakfast.  90 capsule  3  . FLUoxetine (PROZAC) 40 MG capsule Take 40 mg by mouth daily.        . Fluticasone-Salmeterol (ADVAIR DISKUS) 250-50 MCG/DOSE AEPB Inhale 1 puff into the lungs 2 (two) times daily.  1 each  3  . gabapentin (NEURONTIN) 100 MG capsule Take 100 mg by mouth daily.       [name] HYDROcodone-homatropine (HYCODAN) 5-1.5 MG/5ML syrup Take 5 mLs by mouth every 6 (six) hours as needed for cough.  240 mL  0  . Indacaterol Maleate (ARCAPTA NEOHALER) 75 MCG CAPS Place 1 capsule into inhaler and inhale daily.      . isosorbide mononitrate (IMDUR) 30 MG 24 hr tablet Take 1 tablet (30 mg total) by mouth daily.  30 tablet  3  . losartan (COZAAR) 25 MG tablet Take 1 tablet (25 mg total) by mouth daily.  90 tablet  3  . methocarbamol (ROBAXIN) 500 MG tablet Take 500  mg by mouth 2 (two) times daily.       . mirtazapine (REMERON) 15 MG tablet Take 1.5 tablets (22.5 mg total) by mouth at bedtime.  90 tablet  1  . modafinil (PROVIGIL) 200 MG tablet Take 200 mg by mouth daily.       [name] morphine (MS CONTIN) 60 MG 12 hr tablet Take 60 mg by mouth 2 (two) times daily.        . nitroGLYCERIN (NITROSTAT) 0.4 MG SL tablet Place 1 tablet (0.4 mg total) under the tongue every 5 (five) minutes as needed for chest pain.  25 tablet  3  . Omega-3 Fatty Acids (FISH OIL CONCENTRATE) 1000 MG CAPS Take 1,000 mg by mouth daily.       . ondansetron (ZOFRAN) 8 MG tablet Take 1 tablet (8 mg total) by mouth every 8 (eight) hours as needed for nausea or vomiting.  20 tablet  0  . oxyCODONE-acetaminophen (PERCOCET) 10-325 MG per tablet Take 1 tablet by mouth 3  (three) times daily as needed. For pain      . potassium chloride SA (K-DUR,KLOR-CON) 20 MEQ tablet Take 20 mEq by mouth daily.      . prochlorperazine (COMPAZINE) 10 MG tablet Take 1 tablet (10 mg total) by mouth every 6 (six) hours as needed for nausea or vomiting.  60 tablet  1  . sucralfate (CARAFATE) 1 G tablet Take 1 tablet (1 g total) by mouth 4 (four) times daily -  with meals and at bedtime. Take 5 min before eating  120 tablet  5  . VOLTAREN 1 % GEL Apply 2 g topically daily as needed (for pain).        No current facility-administered medications for this visit.    No Known Allergies  Past Medical History  Diagnosis Date  . CAD (coronary artery disease)     Left Main 30% stenosis, LAD 20 - 30 % stenosis, first and second diagonal branchesat 40 - 50%  stenosis with small arteries, circumflex had 30% stenosis in the large obtuse marginal, RCA at 70 - 80%  stenosis [not felt to be occlusive after evaluation with flow wire], distal 50 - 60% stenosis - [name][  . COPD (chronic obstructive pulmonary disease)     Dr. Baird Lyons  . Depression   . Anxiety   . Hyperlipidemia   . Chronic insomnia   . Gout   . GERD (gastroesophageal reflux disease)   . PVD (peripheral vascular disease)     (ABI 0.9 on right and 0.89 on left)  severe left external iliac stenosis.  He  had successful stenting of his left external iliac per Dr. Burt Knack.  . DDD (degenerative disc disease)   . Hx of colonoscopy   . 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 Cancer     lung  . OSA (obstructive sleep apnea)     NPSG 09/10/10- AHI 11.3/hr  . Hypertension     dr Percival Spanish  . Lung cancer 10/04/13    LUL squamous cell lung cancer    Past Surgical History  Procedure Laterality Date  . Spinal fusion  03/05/2007    L4-L5  . Hip surgery      left  . Arm surgery      left  . Shoulder surgery      right  . C-spine surgery    . Angioplasty    . Video bronchoscopy with endobronchial navigation N/A  10/04/2013    Procedure: VIDEO BRONCHOSCOPY  WITH ENDOBRONCHIAL NAVIGATION;  Surgeon: Collene Gobble, MD;  Location: [facility];  Service: Thoracic;  Laterality: N/A;  . Back surgery      History  Smoking status  . Former Smoker -- 0.50 packs/day for 43 years  . Types: Cigarettes  . Quit date: 09/23/2013  Smokeless tobacco  . Never Used    Comment: On Chantix. smokes 2- 3 cigarettes daily, history of 3 PPD, 11/02/13 not smoking at all    History  Alcohol Use No    Family History  Problem Relation Age of Onset  . Heart attack Mother   . Heart attack Sister   . Lung cancer Sister   . Cancer Sister     small cell lung, mets to brain  . Emphysema Sister   . Hypertension Brother     Review of Systems: The review of systems is per the HPI.  All other systems were reviewed and are negative.  Physical Exam: BP 130/70  Pulse 78  Ht 5\' 7"  (1.702 m)  Wt 183 lb 12.8 oz (83.371 kg)  BMI 28.78 kg/m2  SpO2 100% Patient is very pleasant and in no acute distress. Skin is warm and dry. Color is normal.  HEENT is unremarkable. Normocephalic/atraumatic. PERRL. Sclera are nonicteric. Neck is supple. No masses. No JVD. Lungs are coarse. Cardiac exam shows a regular rate and rhythm. Abdomen is soft. Extremities are without edema. Gait and ROM are intact. No gross neurologic deficits noted.  Wt Readings from Last 3 Encounters:  12/07/13 183 lb 12.8 oz (83.371 kg)  12/06/13 181 lb 3.2 oz (82.192 kg)  11/29/13 178 lb 4.8 oz (80.876 kg)     LABORATORY DATA: Lab Results  Component Value Date   WBC 4.0 12/06/2013   HGB 12.0* 12/06/2013   HCT 37.0* 12/06/2013   PLT 84* 12/06/2013   GLUCOSE 124 11/29/2013   CHOL 122 04/27/2013   TRIG 138.0 04/27/2013   HDL 35.20* 04/27/2013   LDLDIRECT 71.4 09/08/2012   LDLCALC 59 04/27/2013   ALT 18 11/01/2013   AST 15 11/01/2013   NA 140 11/29/2013   K 3.7 11/29/2013   CL 100 10/08/2013   CREATININE 0.7 11/29/2013   BUN 8.6 11/29/2013   CO2 25 11/29/2013   TSH 1.39 04/27/2013     PSA 0.64 07/18/2011   INR 1.00 10/01/2013   HGBA1C 6.1 10/08/2013   Myoview Impression  Exercise Capacity: Lexiscan with no exercise.  BP Response: Normal blood pressure response.  Clinical Symptoms: There is dyspnea and chest tightness.  ECG Impression: No significant ST segment change suggestive of ischemia.  Comparison with Prior Nuclear Study: No significant change compared to 09/16/12.  Overall Impression: Normal stress nuclear study.  LV Ejection Fraction: 65%. LV Wall Motion: NL LV Function; NL Wall Motion  Kirk Ruths    Assessment / Plan: 1. Chest pain - known CAD - remote cath 7 years ago - with lots of CV risk factors and now with a large left lung cancer. Myoview looks good. Symptoms are resolved. Will keep him on the Imdur. See him back in about 4 months.  2. Lung cancer - with chemoradiation in process.   3. HTN - BP looks ok  4. HLD   Patient is agreeable to this plan and will call if any problems develop in the interim.   Burtis Junes, RN, [facility]  [address] [place]  [place], [place] [address]

## 2013-12-08 ENCOUNTER — Ambulatory Visit
Admission: RE | Admit: 2013-12-08 | Discharge: 2013-12-08 | Disposition: A | Discharge status: Home / Self Care | Payer: Commercial Managed Care - HMO | Source: Ambulatory Visit | Attending: Radiation Oncology | Admitting: Radiation Oncology

## 2013-12-08 DIAGNOSIS — Z510 Encounter for antineoplastic radiation therapy: Secondary | ICD-10-CM | POA: Diagnosis not present

## 2013-12-09 ENCOUNTER — Ambulatory Visit
Admission: RE | Admit: 2013-12-09 | Discharge: 2013-12-09 | Disposition: A | Discharge status: Home / Self Care | Payer: Commercial Managed Care - HMO | Source: Ambulatory Visit | Attending: Radiation Oncology | Admitting: Radiation Oncology

## 2013-12-09 DIAGNOSIS — Z510 Encounter for antineoplastic radiation therapy: Secondary | ICD-10-CM | POA: Diagnosis not present

## 2013-12-10 ENCOUNTER — Ambulatory Visit
Admission: RE | Admit: 2013-12-10 | Discharge: 2013-12-10 | Disposition: A | Discharge status: Home / Self Care | Payer: Commercial Managed Care - HMO | Source: Ambulatory Visit | Attending: Radiation Oncology | Admitting: Radiation Oncology

## 2013-12-10 DIAGNOSIS — Z510 Encounter for antineoplastic radiation therapy: Secondary | ICD-10-CM | POA: Diagnosis not present

## 2013-12-13 ENCOUNTER — Encounter: Payer: Self-pay | Admitting: Radiation Oncology

## 2013-12-13 ENCOUNTER — Ambulatory Visit (HOSPITAL_BASED_OUTPATIENT_CLINIC_OR_DEPARTMENT_OTHER): Payer: Commercial Managed Care - HMO

## 2013-12-13 ENCOUNTER — Other Ambulatory Visit: Payer: Self-pay | Admitting: Internal Medicine

## 2013-12-13 ENCOUNTER — Other Ambulatory Visit (HOSPITAL_BASED_OUTPATIENT_CLINIC_OR_DEPARTMENT_OTHER): Payer: Commercial Managed Care - HMO

## 2013-12-13 ENCOUNTER — Ambulatory Visit
Admission: RE | Admit: 2013-12-13 | Discharge: 2013-12-13 | Disposition: A | Discharge status: Home / Self Care | Payer: Commercial Managed Care - HMO | Source: Ambulatory Visit | Attending: Radiation Oncology | Admitting: Radiation Oncology

## 2013-12-13 ENCOUNTER — Other Ambulatory Visit: Payer: Self-pay | Admitting: Cardiology

## 2013-12-13 VITALS — BP 128/74 | HR 83 | Temp 98.3°F | Resp 18

## 2013-12-13 VITALS — BP 141/77 | HR 87 | Temp 98.4°F | Resp 20 | Wt 184.3 lb

## 2013-12-13 DIAGNOSIS — C3490 Malignant neoplasm of unspecified part of unspecified bronchus or lung: Secondary | ICD-10-CM

## 2013-12-13 DIAGNOSIS — C3492 Malignant neoplasm of unspecified part of left bronchus or lung: Secondary | ICD-10-CM

## 2013-12-13 DIAGNOSIS — Z5111 Encounter for antineoplastic chemotherapy: Secondary | ICD-10-CM

## 2013-12-13 DIAGNOSIS — Z510 Encounter for antineoplastic radiation therapy: Secondary | ICD-10-CM | POA: Diagnosis not present

## 2013-12-13 DIAGNOSIS — C3410 Malignant neoplasm of upper lobe, unspecified bronchus or lung: Secondary | ICD-10-CM

## 2013-12-13 LAB — BASIC METABOLIC PANEL (CC13)
ANION GAP: 11 meq/L (ref 3–11)
BUN: 6.7 mg/dL — ABNORMAL LOW (ref 7.0–26.0)
CALCIUM: 9.6 mg/dL (ref 8.4–10.4)
CO2: 26 mEq/L (ref 22–29)
Chloride: 105 mEq/L (ref 98–109)
Creatinine: 0.7 mg/dL (ref 0.7–1.3)
Glucose: 98 mg/dl (ref 70–140)
POTASSIUM: 3.9 meq/L (ref 3.5–5.1)
SODIUM: 142 meq/L (ref 136–145)

## 2013-12-13 LAB — CBC WITH DIFFERENTIAL/PLATELET
BASO%: 0.4 % (ref 0.0–2.0)
Basophils Absolute: 0 10*3/uL (ref 0.0–0.1)
EOS%: 1.6 % (ref 0.0–7.0)
Eosinophils Absolute: 0.1 10*3/uL (ref 0.0–0.5)
HCT: 34.8 % — ABNORMAL LOW (ref 38.4–49.9)
HGB: 11.5 g/dL — ABNORMAL LOW (ref 13.0–17.1)
LYMPH%: 14.1 % (ref 14.0–49.0)
MCH: 28.5 pg (ref 27.2–33.4)
MCHC: 33 g/dL (ref 32.0–36.0)
MCV: 86.4 fL (ref 79.3–98.0)
MONO#: 0.6 10*3/uL (ref 0.1–0.9)
MONO%: 10.9 % (ref 0.0–14.0)
NEUT%: 73 % (ref 39.0–75.0)
NEUTROS ABS: 3.8 10*3/uL (ref 1.5–6.5)
Platelets: 97 10*3/uL — ABNORMAL LOW (ref 140–400)
RBC: 4.03 10*6/uL — AB (ref 4.20–5.82)
RDW: 18.6 % — ABNORMAL HIGH (ref 11.0–14.6)
WBC: 5.2 10*3/uL (ref 4.0–10.3)
lymph#: 0.7 10*3/uL — ABNORMAL LOW (ref 0.9–3.3)

## 2013-12-13 MED ORDER — FAMOTIDINE IN NACL 20-0.9 MG/50ML-% IV SOLN
INTRAVENOUS | Status: AC
  Filled 2013-12-13: qty 50

## 2013-12-13 MED ORDER — DIPHENHYDRAMINE HCL 50 MG/ML IJ SOLN
50.0000 mg | Freq: Once | INTRAMUSCULAR | Status: AC
  Administered 2013-12-13: 50 mg via INTRAVENOUS

## 2013-12-13 MED ORDER — ONDANSETRON 16 MG/50ML IVPB (CHCC)
INTRAVENOUS | Status: AC
  Filled 2013-12-13: qty 16

## 2013-12-13 MED ORDER — SODIUM CHLORIDE 0.9 % IV SOLN
45.0000 mg/m2 | Freq: Once | INTRAVENOUS | Status: AC
  Administered 2013-12-13: 90 mg via INTRAVENOUS
  Filled 2013-12-13: qty 15

## 2013-12-13 MED ORDER — SODIUM CHLORIDE 0.9 % IV SOLN
Freq: Once | INTRAVENOUS | Status: AC
  Administered 2013-12-13: 13:00:00 via INTRAVENOUS

## 2013-12-13 MED ORDER — SODIUM CHLORIDE 0.9 % IJ SOLN
10.0000 mL | INTRAMUSCULAR | Status: DC | PRN
  Filled 2013-12-13: qty 10

## 2013-12-13 MED ORDER — DEXAMETHASONE SODIUM PHOSPHATE 20 MG/5ML IJ SOLN
20.0000 mg | Freq: Once | INTRAMUSCULAR | Status: AC
  Administered 2013-12-13: 20 mg via INTRAVENOUS

## 2013-12-13 MED ORDER — DEXAMETHASONE SODIUM PHOSPHATE 20 MG/5ML IJ SOLN
INTRAMUSCULAR | Status: AC
  Filled 2013-12-13: qty 5

## 2013-12-13 MED ORDER — ONDANSETRON 16 MG/50ML IVPB (CHCC)
16.0000 mg | Freq: Once | INTRAVENOUS | Status: AC
  Administered 2013-12-13: 16 mg via INTRAVENOUS

## 2013-12-13 MED ORDER — FAMOTIDINE IN NACL 20-0.9 MG/50ML-% IV SOLN
20.0000 mg | Freq: Once | INTRAVENOUS | Status: AC
  Administered 2013-12-13: 20 mg via INTRAVENOUS

## 2013-12-13 MED ORDER — HEPARIN SOD (PORK) LOCK FLUSH 100 UNIT/ML IV SOLN
500.0000 [IU] | Freq: Once | INTRAVENOUS | Status: DC | PRN
  Filled 2013-12-13: qty 5

## 2013-12-13 MED ORDER — SODIUM CHLORIDE 0.9 % IV SOLN
284.0000 mg | Freq: Once | INTRAVENOUS | Status: AC
  Administered 2013-12-13: 280 mg via INTRAVENOUS
  Filled 2013-12-13: qty 28

## 2013-12-13 MED ORDER — DIPHENHYDRAMINE HCL 50 MG/ML IJ SOLN
INTRAMUSCULAR | Status: AC
  Filled 2013-12-13: qty 1

## 2013-12-13 NOTE — Progress Notes (Signed)
Pt denies pain and loss of appetite.He states he is taking Mucinex for cough and congestion. He has prod cough w/clear sputum. He states his SOB is the same. He is fatigued.

## 2013-12-13 NOTE — Patient Instructions (Addendum)
[facility] Discharge Instructions for Patients Receiving Chemotherapy  Today you received the following chemotherapy agents Taxol/Carboplatin To help prevent nausea and vomiting after your treatment, we encourage you to take your nausea medication as prescribed.  If you develop nausea and vomiting that is not controlled by your nausea medication, call the clinic.   BELOW ARE SYMPTOMS THAT SHOULD BE REPORTED IMMEDIATELY:  *FEVER GREATER THAN 100.5 F  *CHILLS WITH OR WITHOUT FEVER  NAUSEA AND VOMITING THAT IS NOT CONTROLLED WITH YOUR NAUSEA MEDICATION  *UNUSUAL SHORTNESS OF BREATH  *UNUSUAL BRUISING OR BLEEDING  TENDERNESS IN MOUTH AND THROAT WITH OR WITHOUT PRESENCE OF ULCERS  *URINARY PROBLEMS  *BOWEL PROBLEMS  UNUSUAL RASH Items with * indicate a potential emergency and should be followed up as soon as possible.  Feel free to call the clinic you have any questions or concerns. The clinic phone number is [phone].    

## 2013-12-13 NOTE — Progress Notes (Signed)
Ok to treat with platelets 97 per [name]

## 2013-12-13 NOTE — Progress Notes (Signed)
Weekly Management Note:  Site: Left lung Current Dose:  4400  cGy Projected Dose: 6600  cGy  Narrative: The patient is seen today for routine under treatment assessment. CBCT/MVCT images/port films were reviewed. The chart was reviewed.   He still doing well. He receives more chemotherapy today. He does have nausea for which she takes an antibiotic almost daily. His weight remains stable his appetite is fair to good. Dyspnea on exertion is unchanged. There is favorable regression of his tumor as seen on his MV CT for his daily treatment.  Physical Examination:  Filed Vitals:   12/13/13 1403  BP: 141/77  Pulse: 87  Temp: 98.4 F (36.9 C)  Resp: 20  .  Weight: 184 lb 4.8 oz (83.598 kg). No change.  Laboratory data: Lab Results  Component Value Date   WBC 5.2 12/13/2013   HGB 11.5* 12/13/2013   HCT 34.8* 12/13/2013   MCV 86.4 12/13/2013   PLT 97* 12/13/2013     Impression: Tolerating radiation therapy well.  Plan: Continue radiation therapy as planned.

## 2013-12-14 ENCOUNTER — Ambulatory Visit
Admission: RE | Admit: 2013-12-14 | Discharge: 2013-12-14 | Disposition: A | Discharge status: Home / Self Care | Payer: Commercial Managed Care - HMO | Source: Ambulatory Visit | Attending: Radiation Oncology | Admitting: Radiation Oncology

## 2013-12-14 DIAGNOSIS — Z510 Encounter for antineoplastic radiation therapy: Secondary | ICD-10-CM | POA: Diagnosis not present

## 2013-12-15 ENCOUNTER — Ambulatory Visit
Admission: RE | Admit: 2013-12-15 | Discharge: 2013-12-15 | Disposition: A | Discharge status: Home / Self Care | Payer: Commercial Managed Care - HMO | Source: Ambulatory Visit | Attending: Radiation Oncology | Admitting: Radiation Oncology

## 2013-12-15 DIAGNOSIS — Z510 Encounter for antineoplastic radiation therapy: Secondary | ICD-10-CM | POA: Diagnosis not present

## 2013-12-16 ENCOUNTER — Ambulatory Visit
Admission: RE | Admit: 2013-12-16 | Discharge: 2013-12-16 | Disposition: A | Discharge status: Home / Self Care | Payer: Commercial Managed Care - HMO | Source: Ambulatory Visit | Attending: Radiation Oncology | Admitting: Radiation Oncology

## 2013-12-16 DIAGNOSIS — Z510 Encounter for antineoplastic radiation therapy: Secondary | ICD-10-CM | POA: Diagnosis not present

## 2013-12-17 ENCOUNTER — Ambulatory Visit
Admission: RE | Admit: 2013-12-17 | Discharge: 2013-12-17 | Disposition: A | Discharge status: Home / Self Care | Payer: Commercial Managed Care - HMO | Source: Ambulatory Visit | Attending: Radiation Oncology | Admitting: Radiation Oncology

## 2013-12-17 ENCOUNTER — Ambulatory Visit (INDEPENDENT_AMBULATORY_CARE_PROVIDER_SITE_OTHER): Payer: Medicare HMO | Admitting: Internal Medicine

## 2013-12-17 ENCOUNTER — Encounter: Payer: Self-pay | Admitting: Internal Medicine

## 2013-12-17 VITALS — BP 114/62 | HR 86 | Ht 66.0 in | Wt 186.0 lb

## 2013-12-17 DIAGNOSIS — C3490 Malignant neoplasm of unspecified part of unspecified bronchus or lung: Secondary | ICD-10-CM

## 2013-12-17 DIAGNOSIS — Z510 Encounter for antineoplastic radiation therapy: Secondary | ICD-10-CM | POA: Diagnosis not present

## 2013-12-17 DIAGNOSIS — G4733 Obstructive sleep apnea (adult) (pediatric): Secondary | ICD-10-CM

## 2013-12-17 DIAGNOSIS — F17200 Nicotine dependence, unspecified, uncomplicated: Secondary | ICD-10-CM

## 2013-12-17 DIAGNOSIS — J449 Chronic obstructive pulmonary disease, unspecified: Secondary | ICD-10-CM

## 2013-12-17 NOTE — Assessment & Plan Note (Signed)
Good compliance and control 

## 2013-12-17 NOTE — Assessment & Plan Note (Signed)
Discussed.

## 2013-12-17 NOTE — Assessment & Plan Note (Signed)
Controlled w/o need for changes today. Meds reviewed.

## 2013-12-17 NOTE — Patient Instructions (Addendum)
We can continue CPAP and current meds.  Please call us if we can help  Disclaimer: This note was dictated with voice recognition software. Similar sounding words can inadvertently be transcribed and may not be corrected upon review.

## 2013-12-17 NOTE — Progress Notes (Signed)
Patient ID: Ernest [name], male    DOB: [birth year], 58 y.o.   MRN: [id number]  HPI 07/30/11- 58 yoM smoker followed for COPD/ bronchitis, OSA/ CPAP Last here 04/01/11- note reviewed. He had felt better on a trial of maintenance prednisone. He has felt more short of breath through the hot summer, with no acute event and without blood, chest pain or fever. Easy DOE around home. Has been sharing nebulizer with his wife, who now has a cold and we discussed this. Has been worse in last month, and expecially in recent rainy weather. Noting a little ankle swelling. Smothered trying to take a shower.  Only tolerating his CPAP about 3 nights/ week because he feels smothered, waking sore in anterior chest in the mornings.  Now down to only 2-3 cigarettes/ day and trying to stop.  CXR 08/14/10- Nl NAD PFT 05/26/08- FEV1/FVC 2.44/ 0.76, incr 19% w/ BD, R 0.59 ?DLCO 0.69  09/10/11- 58 yoM smoker followed for COPD/ bronchitis, OSA/ CPAP He reports feeling better "finally doing okay". He is down to just a couple of cigarettes a day. He is using CPAP regularly now and is comfortable with it. Alpha I antitrypsin Report came back normal "M. M." on 08/09/2011. Chest x-ray from 08/15/2011 showed stable changes of COPD. He has had flu vaccine.  11/12/11- 58 yoM smoker followed for COPD/ bronchitis, OSA/ CPAP Here with wife. Has had flu shot. He says he was doing well until 2 weeks ago. He again is coughing thick mucus which is difficult to clear. He depends on his metered inhaler and nebulizer to help clear his airways. Started Mucinex 3 days ago. Mucus is white. He denies fever, sore throat, chest pain, GI upset. Because of orthopnea he is sitting up to sleep for the past 2 nights.  01/14/12- 58 yoM smoker followed for COPD/ bronchitis, OSA/ CPAP Increase congested cough green to yellow sputum but he does not feel sick and denies fever. Sister who was a smoker recently died of lung cancer. Despite that and all of our  discussions, he still smokes a few cigarettes daily. We talked again about motivation to stop completely. Continues CPAP and Provigil one or 2 daily.  03/10/12  58 yoM smoker followed for COPD/ bronchitis, OSA/ CPAP Persistent bronchitic cough. Sputum is not purulent. He is "working on" his smoking habit but blames pollen for keeping him S. coughing now. Remains fully compliant with CPAP, all night every night. Still needs Provigil to help with residual daytime sleepiness as before.  05/12/12- 58 yoM smoker followed for COPD/ bronchitis, OSA/ CPAP Stays SOB all the time-even at rest; wheezing, coughing-productive-yellow in color, chest congestion, nasal congestion,. He is more interested today in smoking cessation which we discussed. He had quit for Daliresp after 10 days because of sustained nausea. He is taking all of his regular medicines and using his rescue inhaler several times a day. Off prednisone now for 1-2 months. CXR 01/23/12-  IMPRESSION:  No acute cardiopulmonary abnormality.  Original Report Authenticated By: Randall An, M.D.   07/13/12- 58 yoM smoker followed for COPD/ bronchitis, OSA/ CPAP, complicated by CAD Pt states increased sob,wheezing,productive cough x3 months. Still smoking with no serious effort to stop. His sister has lung cancer which has gotten his attention and he may be willing to try harder. Some days his breathing is better than others with no real trend. Coughing productive of white sputum with no blood. He has all of his medications currently.  07/30/12- 58 yoM  smoker followed for COPD/ bronchitis, OSA/ CPAP, complicated by CAD Cough-productive-yellow and thick; SOB, wheezing, chest congestion, head congestion x 1 week; O2 sat of 83% RA entered room-20 seconds RA sat of 90% CT 07/21/12-  IMPRESSION:  1. Tiny bilateral lung nodules which are similar to on the prior  exam, given differences in slice selection and measurement error.  Likely subpleural lymph  nodes.  2. Age advanced coronary artery atherosclerosis. Recommend  assessment of coronary risk factors and consideration of medical  therapy.  3. Slight enlargement of splenic lesion since 2007. Favored to  represent a hemangioma or lymphangioma.  Original Report Authenticated By: Areta Haber, M.D.   08/07/12- 58 yoM smoker followed for COPD/ bronchitis, OSA/ CPAP, complicated by CAD, disability for back pain. Cough-productive-white mostly; SOB , wheezing-worse at night     wife here Coughing spasms he can feel his upper airway is closing off. He he still smokes a little even when he feels badly and we went over this again today offering support.  11/13/12- 58 yoM smoker followed for COPD/ bronchitis, OSA/ CPAP, complicated by CAD, disability for back pain. FOLLOWS BJS:EGBTD still but not productive-more SOB  Went to ER in September or acute exacerbation of COPD but not admitted. Today he is a scheduled visit. He actually says he is doing pretty well with no acute problems. Perennial nasal congestion. He has reduced his cigarette smoking to about 2 per day and is strongly encouraged to stop completely now.  01/25/13-  57 yoM smoker followed for COPD/ bronchitis, OSA/ CPAP, complicated by CAD, disability for back pain. ACUTE VISIT: started smoking agian-discuss taking Chantix; feels raw in chest area with deep cough x 2 weeks. He feels well at this visit, meaning he is at his baseline some daily nonproductive cough and shortness of breath with exertion.  05/17/13-57 yoM smoker followed for COPD/ bronchitis, OSA/ CPAP, complicated by CAD, disability for back pain. FOLLOWS VVO:HYWVPXTGG is about the same as last visit; has lessened the about the amount he is smoking. Chantix helps. Continues CPAP/ 10/[facility] Discussed smoking cessation effort.  06/10/13- 57 yoM smoker followed for COPD/ bronchitis, OSA/ CPAP, complicated by CAD, disability for back pain. ACUTE VISIT: prod cough with yellow/green  mucus, increased SOB, wheezing, tightness in chest, chills x1 week.  still on augmentin and pred taper from 7/15 phone note > no better Trying Chantix. Wife is also sick. There exposed to his brother's child who has frequent colds. Just started prednisone taper and Augmentin yesterday. Had teeth pulled.  09/16/13- 57 yoM smoker followed for COPD/ bronchitis, OSA/ CPAP, complicated by CAD, disability for back pain. FOLLOWS FOR: was given the flu shot about 1 month and has not felt good since; continues to keep a headache, chest congestion at night. Describes frontal headache, head and chest congestion since a flu shot. Using Chantix, has reduced smoking to 2 or 3 cigarettes daily. Denies infection-no fever, sputum not purulent or bloody. No chest pain. Stays on Mucinex. Not using CPAP because of head congestion. Sister has died of lung cancer/smoker.  10/26/13- 57 yoM smoker followed for COPD/ bronchitis, OSA/ CPAP, SqCell CA LUL,complicated by CAD, disability for back pain. FOLLOWS FOR: review bx results with patient; continues to have bad frontal lobe headache and lots of congestion. Nausea last night. No vomiting. Abnl CXR > CT chest/ large LUL mass to hilum. We had notified pt and scheduled bx. He is not coughing up much, denies chest pain. New headache mostly left frontal and  retro-orbital x 1 month with no lateralizing neuro. Remains tight on routine meds, off prednisone.  His wife blames "anxiety" for ER trip/ nebulizer the night before his bronchoscopy. Sister recently died of small cell Ca lung.  CT chest 09/17/13- IMPRESSION:  1. 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 This is associated with  some postobstructive changes in the left upper lobe as well as left  hilar and AP window lymphadenopathy. Based on these findings, and  assuming lack of distant metastatic disease (which will have to be  proven by other imaging  studies), this is favored to represent at  least T2b (if not T3 because of satellite nodules in the left upper  lobe), N2, Mx disease (i.e., at least stage IIIA disease). Further  evaluation with PET-CT and brain MRI with and without IV gadolinium  is suggested at this time for complete staging.  2. Mild centrilobular and paraseptal emphysema.  3. Atherosclerosis, including 3 vessel coronary artery disease.  Assessment for potential risk factor modification, dietary therapy  or pharmacologic therapy may be warranted, if clinically indicated.  4. Additional incidental findings, as above.  Electronically Signed  By: Vinnie Langton M.D.  On: 09/22/2013 11:34  Bronch / needle bx 10/04/13- by Dr Lamonte Sakai Pos Squamous Cell Ca  1/23//15- 57 yoM former smoker followed for COPD/ bronchitis, OSA/ CPAP, SqCell CA LUL/ XRT/Chemo, complicated by CAD, disability for back pain. Myocardial Perfusion 11/19/13- EF 65%, Nl wall motion, no ischemic changes. Follows for: frontal lobe headaches have gone away, SOB constantly, some prod cough with white/clear mucous.  Not smoking.  Nearing completion of current rounds of Chemo/ XRT.  Minor sore throat, breathing ok- some SOB, dry cough.  No recent steroids or ABX except Advair.  Continues CPAP 10/ [facility] with no problem.   Review of Systems- see HPI Constitutional:   No-   weight loss, night sweats, fevers, chills,+ fatigue, lassitude. HEENT:  No- headaches, difficulty swallowing, tooth/dental problems, sore throat,       No-  sneezing, itching, ear ache, nasal congestion, post nasal drip,  CV:   No chest pain, orthopnea, PND,  Little swelling in lower extremities,  No-dizziness, palpitations Resp: +  shortness of breath with exertion or at rest.             No- productive cough,  + non-productive cough,  No-  coughing up of blood.              No-   change in color of mucus.  +some wheezing.   Skin: No-   rash or lesions. GI:  No-   heartburn, indigestion,  abdominal pain, nausea, vomiting, GU:  MS:  No-   joint pain or swelling.   Neuro- nothing unusual  Psych:  No- change in mood or affect. No depression or anxiety.  No memory loss.    Objective:   Physical Exam General- Alert, Oriented, Affect-appropriate, Distress- none acute   Stocky/ overweight,  Skin- rash-none, lesions- none, excoriation- none.      Lymphadenopathy- none Head- atraumatic            Eyes- Gross vision intact, +PERRLA, conjunctivae- injected. + peri-orbital edema            Ears- Hearing, canals- normal            Nose- +turbinate edema, No- Septal dev, +mucus bridging, no-polyps, erosion, perforation  Throat- Mallampati III , +tongue coated, drainage- none, tonsils- atrophic,  Neck- flexible , trachea midline, no stridor , thyroid nl, carotid no bruit Chest - symmetrical excursion , unlabored           Heart/CV- RRR , no murmur , no gallop  , no rub, nl s1 s2                           - JVD- none , edema-none, stasis changes- none, varices- none           Lung-+ distant /Coarse, unlabored,  wheeze- none, no- cough , dullness-none, rub- none           Chest wall-  Abd-  Br/ Gen/ Rectal- Not done, not indicated Extrem- cyanosis- none, clubbing, none, atrophy- none, strength- nl. 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 Neuro- grossly intact to observation

## 2013-12-17 NOTE — Assessment & Plan Note (Signed)
Managed [facility]

## 2013-12-20 ENCOUNTER — Other Ambulatory Visit (HOSPITAL_BASED_OUTPATIENT_CLINIC_OR_DEPARTMENT_OTHER): Payer: Commercial Managed Care - HMO

## 2013-12-20 ENCOUNTER — Telehealth: Payer: Self-pay | Admitting: Internal Medicine

## 2013-12-20 ENCOUNTER — Ambulatory Visit
Admission: RE | Admit: 2013-12-20 | Discharge: 2013-12-20 | Disposition: A | Discharge status: Home / Self Care | Payer: Commercial Managed Care - HMO | Source: Ambulatory Visit | Attending: Radiation Oncology | Admitting: Radiation Oncology

## 2013-12-20 ENCOUNTER — Ambulatory Visit (INDEPENDENT_AMBULATORY_CARE_PROVIDER_SITE_OTHER): Payer: Medicare HMO | Admitting: Internal Medicine

## 2013-12-20 ENCOUNTER — Ambulatory Visit (HOSPITAL_BASED_OUTPATIENT_CLINIC_OR_DEPARTMENT_OTHER): Payer: Commercial Managed Care - HMO | Admitting: Physician Assistant

## 2013-12-20 ENCOUNTER — Encounter: Payer: Self-pay | Admitting: Physician Assistant

## 2013-12-20 ENCOUNTER — Encounter: Payer: Self-pay | Admitting: Internal Medicine

## 2013-12-20 ENCOUNTER — Ambulatory Visit (HOSPITAL_BASED_OUTPATIENT_CLINIC_OR_DEPARTMENT_OTHER): Payer: Commercial Managed Care - HMO

## 2013-12-20 VITALS — Wt 185.8 lb

## 2013-12-20 VITALS — BP 148/59 | HR 90 | Temp 97.3°F | Resp 20 | Ht 66.0 in | Wt 184.2 lb

## 2013-12-20 VITALS — BP 132/70 | HR 84 | Temp 98.2°F | Ht 66.0 in | Wt 185.0 lb

## 2013-12-20 DIAGNOSIS — C3490 Malignant neoplasm of unspecified part of unspecified bronchus or lung: Secondary | ICD-10-CM

## 2013-12-20 DIAGNOSIS — C3410 Malignant neoplasm of upper lobe, unspecified bronchus or lung: Secondary | ICD-10-CM

## 2013-12-20 DIAGNOSIS — F329 Major depressive disorder, single episode, unspecified: Secondary | ICD-10-CM

## 2013-12-20 DIAGNOSIS — R5383 Other fatigue: Secondary | ICD-10-CM

## 2013-12-20 DIAGNOSIS — J449 Chronic obstructive pulmonary disease, unspecified: Secondary | ICD-10-CM

## 2013-12-20 DIAGNOSIS — Z510 Encounter for antineoplastic radiation therapy: Secondary | ICD-10-CM | POA: Diagnosis not present

## 2013-12-20 DIAGNOSIS — C3492 Malignant neoplasm of unspecified part of left bronchus or lung: Secondary | ICD-10-CM

## 2013-12-20 DIAGNOSIS — R070 Pain in throat: Secondary | ICD-10-CM

## 2013-12-20 DIAGNOSIS — R110 Nausea: Secondary | ICD-10-CM

## 2013-12-20 DIAGNOSIS — F3289 Other specified depressive episodes: Secondary | ICD-10-CM

## 2013-12-20 DIAGNOSIS — Z5111 Encounter for antineoplastic chemotherapy: Secondary | ICD-10-CM

## 2013-12-20 DIAGNOSIS — R5381 Other malaise: Secondary | ICD-10-CM

## 2013-12-20 LAB — CBC WITH DIFFERENTIAL/PLATELET
BASO%: 0.2 % (ref 0.0–2.0)
Basophils Absolute: 0 10*3/uL (ref 0.0–0.1)
EOS%: 2.6 % (ref 0.0–7.0)
Eosinophils Absolute: 0.1 10*3/uL (ref 0.0–0.5)
HCT: 37 % — ABNORMAL LOW (ref 38.4–49.9)
HGB: 12.2 g/dL — ABNORMAL LOW (ref 13.0–17.1)
LYMPH%: 15.9 % (ref 14.0–49.0)
MCH: 28.8 pg (ref 27.2–33.4)
MCHC: 33 g/dL (ref 32.0–36.0)
MCV: 87.3 fL (ref 79.3–98.0)
MONO#: 0.2 10*3/uL (ref 0.1–0.9)
MONO%: 5.5 % (ref 0.0–14.0)
NEUT#: 3.2 10*3/uL (ref 1.5–6.5)
NEUT%: 75.8 % — AB (ref 39.0–75.0)
NRBC: 0 % (ref 0–0)
PLATELETS: 120 10*3/uL — AB (ref 140–400)
RBC: 4.24 10*6/uL (ref 4.20–5.82)
RDW: 19 % — ABNORMAL HIGH (ref 11.0–14.6)
WBC: 4.2 10*3/uL (ref 4.0–10.3)
lymph#: 0.7 10*3/uL — ABNORMAL LOW (ref 0.9–3.3)

## 2013-12-20 LAB — BASIC METABOLIC PANEL (CC13)
Anion Gap: 9 mEq/L (ref 3–11)
BUN: 10.3 mg/dL (ref 7.0–26.0)
CALCIUM: 9.5 mg/dL (ref 8.4–10.4)
CHLORIDE: 105 meq/L (ref 98–109)
CO2: 26 mEq/L (ref 22–29)
CREATININE: 0.7 mg/dL (ref 0.7–1.3)
Glucose: 127 mg/dl (ref 70–140)
Potassium: 4.2 mEq/L (ref 3.5–5.1)
Sodium: 141 mEq/L (ref 136–145)

## 2013-12-20 MED ORDER — DIPHENHYDRAMINE HCL 50 MG/ML IJ SOLN
INTRAMUSCULAR | Status: AC
  Filled 2013-12-20: qty 1

## 2013-12-20 MED ORDER — ONDANSETRON 16 MG/50ML IVPB (CHCC)
16.0000 mg | Freq: Once | INTRAVENOUS | Status: AC
  Administered 2013-12-20: 16 mg via INTRAVENOUS

## 2013-12-20 MED ORDER — FAMOTIDINE IN NACL 20-0.9 MG/50ML-% IV SOLN
20.0000 mg | Freq: Once | INTRAVENOUS | Status: AC
  Administered 2013-12-20: 20 mg via INTRAVENOUS

## 2013-12-20 MED ORDER — SODIUM CHLORIDE 0.9 % IV SOLN
284.0000 mg | Freq: Once | INTRAVENOUS | Status: AC
  Administered 2013-12-20: 280 mg via INTRAVENOUS
  Filled 2013-12-20: qty 28

## 2013-12-20 MED ORDER — DEXAMETHASONE SODIUM PHOSPHATE 20 MG/5ML IJ SOLN
INTRAMUSCULAR | Status: AC
  Filled 2013-12-20: qty 5

## 2013-12-20 MED ORDER — SODIUM CHLORIDE 0.9 % IV SOLN
45.0000 mg/m2 | Freq: Once | INTRAVENOUS | Status: AC
  Administered 2013-12-20: 90 mg via INTRAVENOUS
  Filled 2013-12-20: qty 15

## 2013-12-20 MED ORDER — DIPHENHYDRAMINE HCL 50 MG/ML IJ SOLN
50.0000 mg | Freq: Once | INTRAMUSCULAR | Status: AC
  Administered 2013-12-20: 50 mg via INTRAVENOUS

## 2013-12-20 MED ORDER — SODIUM CHLORIDE 0.9 % IV SOLN
Freq: Once | INTRAVENOUS | Status: AC
  Administered 2013-12-20: 11:00:00 via INTRAVENOUS

## 2013-12-20 MED ORDER — DEXAMETHASONE SODIUM PHOSPHATE 20 MG/5ML IJ SOLN
20.0000 mg | Freq: Once | INTRAMUSCULAR | Status: AC
  Administered 2013-12-20: 20 mg via INTRAVENOUS

## 2013-12-20 MED ORDER — FAMOTIDINE IN NACL 20-0.9 MG/50ML-% IV SOLN
INTRAVENOUS | Status: AC
  Filled 2013-12-20: qty 50

## 2013-12-20 MED ORDER — ALPRAZOLAM 0.25 MG PO TABS: 0.2500 mg | ORAL_TABLET | Freq: Every day | ORAL | Status: DC | PRN

## 2013-12-20 MED ORDER — ONDANSETRON 16 MG/50ML IVPB (CHCC)
INTRAVENOUS | Status: AC
  Filled 2013-12-20: qty 16

## 2013-12-20 NOTE — Patient Instructions (Signed)
[facility] Discharge Instructions for Patients Receiving Chemotherapy  Today you received the following chemotherapy agents Taxol/Carboplatin To help prevent nausea and vomiting after your treatment, we encourage you to take your nausea medication as prescribed.  If you develop nausea and vomiting that is not controlled by your nausea medication, call the clinic.   BELOW ARE SYMPTOMS THAT SHOULD BE REPORTED IMMEDIATELY:  *FEVER GREATER THAN 100.5 F  *CHILLS WITH OR WITHOUT FEVER  NAUSEA AND VOMITING THAT IS NOT CONTROLLED WITH YOUR NAUSEA MEDICATION  *UNUSUAL SHORTNESS OF BREATH  *UNUSUAL BRUISING OR BLEEDING  TENDERNESS IN MOUTH AND THROAT WITH OR WITHOUT PRESENCE OF ULCERS  *URINARY PROBLEMS  *BOWEL PROBLEMS  UNUSUAL RASH Items with * indicate a potential emergency and should be followed up as soon as possible.  Feel free to call the clinic you have any questions or concerns. The clinic phone number is [phone].    

## 2013-12-20 NOTE — Progress Notes (Addendum)
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  PRIOR THERAPY: None  CURRENT THERAPY: Concurrent chemoradiation with weekly carboplatin for AUC of 2 and paclitaxel 45 mg/M2, status post 6 cycles. First dose on 11/01/2013.  CHEMOTHERAPY INTENT: Curative/control  CURRENT # OF CHEMOTHERAPY CYCLES: 7   CURRENT ANTIEMETICS: Zofran, dexamethasone and Compazine  CURRENT SMOKING STATUS: Former smoker  ORAL CHEMOTHERAPY AND CONSENT: None  CURRENT BISPHOSPHONATES USE: None  PAIN MANAGEMENT: 0/10  NARCOTICS INDUCED CONSTIPATION: None.  LIVING WILL AND CODE STATUS: Full code.   INTERVAL HISTORY: Ernest [name] 58 y.o. male returns to the clinic today for followup visit accompanied by his wife. The patient is feeling fine today with no specific complaints except for mild to moderate nausea. The nausea is well manage with his current antiemetic taking one tablet twice daily. He reports that when starting his course of concurrent chemoradiation he initially had some numbness and tingling in his fingertips however this has completely resolved. He voiced no other specific complaints today. He tolerated the last dose of his concurrent chemoradiation fairly well with no significant adverse effects. His weight is stable. The patient denied having any significant chest pain, shortness of breath, cough or hemoptysis. He has no vomiting. He denied having any significant fever or chills. He is expected to finish his radiotherapy on 12/28/2013.  MEDICAL HISTORY: Past Medical History  Diagnosis Date  . CAD (coronary artery disease)     Left Main 30% stenosis, LAD 20 - 30 %  stenosis, first and second diagonal branchesat 40 - 50%  stenosis with small arteries, circumflex had 30% stenosis in the large obtuse marginal, RCA at 70 - 80%  stenosis [not felt to be occlusive after evaluation with flow wire], distal 50 - 60% stenosis - [name][  . COPD (chronic obstructive pulmonary disease)     Dr. Baird Lyons  . Depression   . Anxiety   . Hyperlipidemia   . Chronic insomnia   . Gout   . GERD (gastroesophageal reflux disease)   . PVD (peripheral vascular disease)     (ABI 0.9 on right and 0.89 on left)  severe left external iliac stenosis.  He  had successful stenting of his left external iliac per Dr. Burt Knack.  . DDD (degenerative disc disease)   . Hx of colonoscopy   . 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 Cancer     lung  . OSA (obstructive sleep apnea)     NPSG 09/10/10- AHI 11.3/hr  . Hypertension     dr Percival Spanish  . Lung cancer 10/04/13    LUL squamous cell lung cancer    ALLERGIES:  has No Known Allergies.  MEDICATIONS:  Current Outpatient Prescriptions  Medication Sig Dispense Refill  . albuterol (PROVENTIL HFA;VENTOLIN HFA) 108 (90 BASE) MCG/ACT inhaler Inhale 2 puffs into the lungs every 4 (four) hours as needed for wheezing or shortness of breath.      [name] albuterol (PROVENTIL) (2.5 MG/3ML) 0.083% nebulizer solution Take 2.5 mg by nebulization every 4 (four) hours as needed for wheezing or shortness of breath.      [name] amLODipine (NORVASC) 10 MG tablet TAKE 1 TABLET (10 MG TOTAL) BY MOUTH DAILY.  [place]  tablet  6  . ARIPiprazole (ABILIFY) 5 MG tablet Take 1 tablet (5 mg total) by mouth at bedtime.      [name] atorvastatin (LIPITOR) 20 MG tablet Take 1 tablet (20 mg total) by mouth daily.  90 tablet  3  . carvedilol (COREG) 25 MG tablet Take 1 tablet (25 mg total) by mouth 2 (two) times daily with a meal.  180 tablet  3  . clonazePAM (KLONOPIN) 1 MG tablet Take 1 mg by mouth at bedtime as needed. For anxiety/insomnia      . esomeprazole (NEXIUM) 40 MG capsule  Take 1 capsule (40 mg total) by mouth daily before breakfast.  90 capsule  3  . FLUoxetine (PROZAC) 40 MG capsule Take 40 mg by mouth daily.        . Fluticasone-Salmeterol (ADVAIR DISKUS) 250-50 MCG/DOSE AEPB Inhale 1 puff into the lungs 2 (two) times daily.  1 each  3  . gabapentin (NEURONTIN) 100 MG capsule Take 100 mg by mouth daily.       . Indacaterol Maleate (ARCAPTA NEOHALER) 75 MCG CAPS Place 1 capsule into inhaler and inhale daily.      . isosorbide mononitrate (IMDUR) 30 MG 24 hr tablet Take 1 tablet (30 mg total) by mouth daily.  30 tablet  3  . losartan (COZAAR) 25 MG tablet Take 1 tablet (25 mg total) by mouth daily.  90 tablet  3  . methocarbamol (ROBAXIN) 500 MG tablet Take 500 mg by mouth 2 (two) times daily.       . mirtazapine (REMERON) 15 MG tablet Take 1.5 tablets (22.5 mg total) by mouth at bedtime.  90 tablet  1  . modafinil (PROVIGIL) 200 MG tablet Take 200 mg by mouth daily.       [name] morphine (MS CONTIN) 60 MG 12 hr tablet Take 60 mg by mouth 2 (two) times daily.        . Omega-3 Fatty Acids (FISH OIL CONCENTRATE) 1000 MG CAPS Take 1,000 mg by mouth daily.       . ondansetron (ZOFRAN) 8 MG tablet Take 1 tablet (8 mg total) by mouth every 8 (eight) hours as needed for nausea or vomiting.  20 tablet  0  . potassium chloride SA (K-DUR,KLOR-CON) 20 MEQ tablet Take 20 mEq by mouth daily.      . prochlorperazine (COMPAZINE) 10 MG tablet Take 1 tablet (10 mg total) by mouth every 6 (six) hours as needed for nausea or vomiting.  60 tablet  1  . sucralfate (CARAFATE) 1 G tablet Take 1 tablet (1 g total) by mouth 4 (four) times daily -  with meals and at bedtime. Take 5 min before eating  120 tablet  5  . VOLTAREN 1 % GEL Apply 2 g topically daily as needed (for pain).       [name] ALPRAZolam (XANAX) 0.25 MG tablet Take 1 tablet (0.25 mg total) by mouth daily as needed for anxiety.  30 tablet  2  . nitroGLYCERIN (NITROSTAT) 0.4 MG SL tablet Place 1 tablet (0.4 mg total) under the tongue  every 5 (five) minutes as needed for chest pain.  25 tablet  3   No current facility-administered medications for this visit.    SURGICAL HISTORY:  Past Surgical History  Procedure Laterality Date  . Spinal fusion  03/05/2007    L4-L5  . Hip surgery      left  . Arm surgery      left  . Shoulder surgery  right  . C-spine surgery    . Angioplasty    . Video bronchoscopy with endobronchial navigation N/A 10/04/2013    Procedure: VIDEO BRONCHOSCOPY WITH ENDOBRONCHIAL NAVIGATION;  Surgeon: Collene Gobble, MD;  Location: [facility] OR;  Service: Thoracic;  Laterality: N/A;  . Back surgery      REVIEW OF SYSTEMS:  A comprehensive review of systems was negative except for: Gastrointestinal: positive for nausea   PHYSICAL EXAMINATION: General appearance: alert, cooperative and no distress Head: Normocephalic, without obvious abnormality, atraumatic Neck: no adenopathy, no JVD, supple, symmetrical, trachea midline and thyroid not enlarged, symmetric, no tenderness/mass/nodules Lymph nodes: Cervical, supraclavicular, and axillary nodes normal. Resp: clear to auscultation bilaterally Back: symmetric, no curvature. ROM normal. No CVA tenderness. Cardio: regular rate and rhythm, S1, S2 normal, no murmur, click, rub or gallop GI: soft, non-tender; bowel sounds normal; no masses,  no organomegaly Extremities: extremities normal, atraumatic, no cyanosis or edema  ECOG PERFORMANCE STATUS: 0 - Asymptomatic  Blood pressure 148/59, pulse 90, temperature 97.3 F (36.3 C), temperature source Oral, resp. rate 20, height 5\' 6"  (1.676 m), weight 184 lb 3.2 oz (83.553 kg).  LABORATORY DATA: Lab Results  Component Value Date   WBC 4.2 12/20/2013   HGB 12.2* 12/20/2013   HCT 37.0* 12/20/2013   MCV 87.3 12/20/2013   PLT 120* 12/20/2013      Chemistry      Component Value Date/Time   NA 141 12/20/2013 0938   NA 136 10/08/2013 0958   K 4.2 12/20/2013 0938   K 3.1* 10/08/2013 0958   CL 100 10/08/2013  0958   CO2 26 12/20/2013 0938   CO2 29 10/08/2013 0958   BUN 10.3 12/20/2013 0938   BUN 10 10/08/2013 0958   CREATININE 0.7 12/20/2013 0938   CREATININE 0.8 10/08/2013 0958      Component Value Date/Time   CALCIUM 9.5 12/20/2013 0938   CALCIUM 9.5 10/08/2013 0958   ALKPHOS 132 11/01/2013 0905   ALKPHOS 141* 10/01/2013 1400   AST 15 11/01/2013 0905   AST 18 10/01/2013 1400   ALT 18 11/01/2013 0905   ALT 19 10/01/2013 1400   BILITOT 0.47 11/01/2013 0905   BILITOT 0.3 10/01/2013 1400       RADIOGRAPHIC STUDIES:  ASSESSMENT AND PLAN: This is a very pleasant 58 years old white male with stage IIIA non-small cell lung cancer, squamous cell carcinoma currently undergoing concurrent chemoradiation with weekly carboplatin and paclitaxel status post 6 weeks of treatment. He is tolerating his treatment fairly well with no significant adverse effects. Patient was discussed with also seen by [name]. He will complete his course of concurrent chemoradiation as scheduled. He'll followup with [name] in approximately 4-5 weeks with a restaging CT scan of his chest to reevaluate his disease. He was advised to call immediately if he has any concerning symptoms in the interval. The patient voices understanding of current disease status and treatment options and is in agreement with the current care plan.  All questions were answered. The patient knows to call the clinic with any problems, questions or concerns. We can certainly see the patient much sooner if necessary.  Carlton Adam PA-C  ADDENDUM: Hematology/Oncology Attending: I had the face to face encounter with the patient today. I recommended his care plan. This is a very pleasant 58 years old white male diagnosed with a stage IIIA non-small cell lung cancer currently undergoing concurrent chemoradiation with weekly carboplatin and paclitaxel status post 6 cycles. The patient  is tolerating his treatment fairly well with no significant  complaints except for mild fatigue and sore throat. He will complete the course of his concurrent chemoradiation this week. The patient would come back for follow up visit in 5 weeks for reevaluation with repeat CT scan of the chest for restaging of his disease. He was advised to call immediately if he has any concerning symptoms in the interval.  Disclaimer: This note was dictated with voice recognition software. Similar sounding words can inadvertently be transcribed and may not be corrected upon review.  Eilleen Kempf., MD 12/20/2013

## 2013-12-20 NOTE — Progress Notes (Signed)
Pre visit review using our clinic review tool, if applicable. No additional management support is needed unless otherwise documented below in the visit note. 

## 2013-12-20 NOTE — Progress Notes (Signed)
Weekly Management Note:  Site: Left lung Current Dose:  5200  cGy Projected Dose: 6600  cGy  Narrative: The patient is seen today for routine under treatment assessment. CBCT/MVCT images/port films were reviewed. The chart was reviewed.   He states that his dyspnea continues to improve. His last chemotherapy was today. He'll have his treatment later today. He is to visit his primary care physician this afternoon.  Physical Examination: There were no vitals filed for this visit..  Weight: 185 lb 12.8 oz (84.278 kg). He looks well. Lungs are clear.  Laboratory data: Lab Results  Component Value Date   WBC 4.2 12/20/2013   HGB 12.2* 12/20/2013   HCT 37.0* 12/20/2013   MCV 87.3 12/20/2013   PLT 120* 12/20/2013     Impression: Tolerating radiation therapy well. He'll finish his radiation therapy next week.  Plan: Continue radiation therapy as planned.

## 2013-12-20 NOTE — Telephone Encounter (Signed)
gv and printedx appt sched and avs for pt for Jan thru March 2015

## 2013-12-20 NOTE — Assessment & Plan Note (Signed)
Stable.  Patient understands to monitor for symptoms of bronchitis / pneumonia.

## 2013-12-20 NOTE — Patient Instructions (Signed)
Completes her course of concurrent chemoradiation as scheduled Followup with [name] in approximately 4-5 weeks after completing her course of concurrent chemoradiation with a restaging CT scan of your chest to reevaluate your disease

## 2013-12-20 NOTE — Assessment & Plan Note (Signed)
Patient recently finished course of chemotherapy. He has one additional week of radiation therapy. Management as per oncology/radiation oncology.

## 2013-12-20 NOTE — Progress Notes (Signed)
Subjective:    Patient ID: Ernest [name], male    DOB: 1956/10/14, 58 y.o.   MRN: [id number]  HPI  58 year old white male with history of left upper lobe squamous cell carcinoma (stage III), advanced COPD and coronary artery disease for followup. Patient followed by oncology and radiation oncology. Patient reports completing chemotherapy. He has one additional week of radiation therapy. Overall he has tolerated his treatments well. He reports sore throat from radiation treatments.  Patient reports following up with his psychiatrist since previous visit. No changes to medication made. He reports anxiety symptoms somewhat worse. Patient worried about his next surveillance CT scan of chest.  He quit smoking.  Review of Systems Weight is stable. Positive for fatigue    Past Medical History  Diagnosis Date  . CAD (coronary artery disease)     Left Main 30% stenosis, LAD 20 - 30 % stenosis, first and second diagonal branchesat 40 - 50%  stenosis with small arteries, circumflex had 30% stenosis in the large obtuse marginal, RCA at 70 - 80%  stenosis [not felt to be occlusive after evaluation with flow wire], distal 50 - 60% stenosis - [name][  . COPD (chronic obstructive pulmonary disease)     Dr. Baird Lyons  . Depression   . Anxiety   . Hyperlipidemia   . Chronic insomnia   . Gout   . GERD (gastroesophageal reflux disease)   . PVD (peripheral vascular disease)     (ABI 0.9 on right and 0.89 on left)  severe left external iliac stenosis.  He  had successful stenting of his left external iliac per Dr. Burt Knack.  . DDD (degenerative disc disease)   . Hx of colonoscopy   . 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 Cancer     lung  . OSA (obstructive sleep apnea)     NPSG 09/10/10- AHI 11.3/hr  . Hypertension     dr Percival Spanish  . Lung cancer 10/04/13    LUL squamous cell lung cancer    History   Social History  . Marital Status: Married    Spouse Name: N/A    Number of  Children: N/A  . Years of Education: N/A   Occupational History  . Disabled welder    Social History Main Topics  . Smoking status: Former Smoker -- 0.50 packs/day for 43 years    Types: Cigarettes    Quit date: 09/23/2013  . Smokeless tobacco: Never Used     Comment: history of 3 PPD, 11/02/13 not smoking at all  . Alcohol Use: No  . Drug Use: No  . Sexual Activity: No   Other Topics Concern  . Not on file   Social History Narrative  . No narrative on file    Past Surgical History  Procedure Laterality Date  . Spinal fusion  03/05/2007    L4-L5  . Hip surgery      left  . Arm surgery      left  . Shoulder surgery      right  . C-spine surgery    . Angioplasty    . Video bronchoscopy with endobronchial navigation N/A 10/04/2013    Procedure: VIDEO BRONCHOSCOPY WITH ENDOBRONCHIAL NAVIGATION;  Surgeon: Collene Gobble, MD;  Location: [facility] OR;  Service: Thoracic;  Laterality: N/A;  . Back surgery      Family History  Problem Relation Age of Onset  . Heart attack Mother   . Heart attack Sister   . Lung cancer  Sister   . Cancer Sister     small cell lung, mets to brain  . Emphysema Sister   . Hypertension Brother     No Known Allergies  Current Outpatient Prescriptions on File Prior to Visit  Medication Sig Dispense Refill  . albuterol (PROVENTIL HFA;VENTOLIN HFA) 108 (90 BASE) MCG/ACT inhaler Inhale 2 puffs into the lungs every 4 (four) hours as needed for wheezing or shortness of breath.      [name] albuterol (PROVENTIL) (2.5 MG/3ML) 0.083% nebulizer solution Take 2.5 mg by nebulization every 4 (four) hours as needed for wheezing or shortness of breath.      [name] amLODipine (NORVASC) 10 MG tablet TAKE 1 TABLET (10 MG TOTAL) BY MOUTH DAILY.  30 tablet  6  . ARIPiprazole (ABILIFY) 5 MG tablet Take 1 tablet (5 mg total) by mouth at bedtime.      [name] atorvastatin (LIPITOR) 20 MG tablet Take 1 tablet (20 mg total) by mouth daily.  90 tablet  3  . carvedilol (COREG) 25 MG tablet  Take 1 tablet (25 mg total) by mouth 2 (two) times daily with a meal.  180 tablet  3  . clonazePAM (KLONOPIN) 1 MG tablet Take 1 mg by mouth at bedtime as needed. For anxiety/insomnia      . esomeprazole (NEXIUM) 40 MG capsule Take 1 capsule (40 mg total) by mouth daily before breakfast.  90 capsule  3  . FLUoxetine (PROZAC) 40 MG capsule Take 40 mg by mouth daily.        . Fluticasone-Salmeterol (ADVAIR DISKUS) 250-50 MCG/DOSE AEPB Inhale 1 puff into the lungs 2 (two) times daily.  1 each  3  . gabapentin (NEURONTIN) 100 MG capsule Take 100 mg by mouth daily.       . Indacaterol Maleate (ARCAPTA NEOHALER) 75 MCG CAPS Place 1 capsule into inhaler and inhale daily.      . isosorbide mononitrate (IMDUR) 30 MG 24 hr tablet Take 1 tablet (30 mg total) by mouth daily.  30 tablet  3  . losartan (COZAAR) 25 MG tablet Take 1 tablet (25 mg total) by mouth daily.  90 tablet  3  . methocarbamol (ROBAXIN) 500 MG tablet Take 500 mg by mouth 2 (two) times daily.       . mirtazapine (REMERON) 15 MG tablet Take 1.5 tablets (22.5 mg total) by mouth at bedtime.  90 tablet  1  . modafinil (PROVIGIL) 200 MG tablet Take 200 mg by mouth daily.       [name] morphine (MS CONTIN) 60 MG 12 hr tablet Take 60 mg by mouth 2 (two) times daily.        . nitroGLYCERIN (NITROSTAT) 0.4 MG SL tablet Place 1 tablet (0.4 mg total) under the tongue every 5 (five) minutes as needed for chest pain.  25 tablet  3  . Omega-3 Fatty Acids (FISH OIL CONCENTRATE) 1000 MG CAPS Take 1,000 mg by mouth daily.       . ondansetron (ZOFRAN) 8 MG tablet Take 1 tablet (8 mg total) by mouth every 8 (eight) hours as needed for nausea or vomiting.  20 tablet  0  . potassium chloride SA (K-DUR,KLOR-CON) 20 MEQ tablet Take 20 mEq by mouth daily.      . prochlorperazine (COMPAZINE) 10 MG tablet Take 1 tablet (10 mg total) by mouth every 6 (six) hours as needed for nausea or vomiting.  60 tablet  1  . sucralfate (CARAFATE) 1 G tablet Take 1 tablet (1  g total) by  mouth 4 (four) times daily -  with meals and at bedtime. Take 5 min before eating  120 tablet  5  . VOLTAREN 1 % GEL Apply 2 g topically daily as needed (for pain).        No current facility-administered medications on file prior to visit.    BP 132/70  Pulse 84  Temp(Src) 98.2 F (36.8 C) (Oral)  Ht 5\' 6"  (1.676 m)  Wt 185 lb (83.915 kg)  BMI 29.87 kg/m2    Objective:   Physical Exam  Constitutional: He is oriented to person, place, and time. He appears well-developed and well-nourished.  HENT:  Head: Normocephalic and atraumatic.  Mouth/Throat: Oropharynx is clear and moist.  Neck: Neck supple.  Cardiovascular: Normal rate, regular rhythm and normal heart sounds.   Pulmonary/Chest: Effort normal.  Prolonged expiration, faint scattered expiratory wheezing  Musculoskeletal: He exhibits no edema.  Lymphadenopathy:    He has no cervical adenopathy.  Neurological: He is alert and oriented to person, place, and time. No cranial nerve deficit.  Skin: Skin is warm and dry.  Psychiatric: He has a normal mood and affect. His behavior is normal.          Assessment & Plan:

## 2013-12-20 NOTE — Assessment & Plan Note (Signed)
Followed by psychiatry.  Patient understandably experiencing more anxiety with recent cancer diagnosis. Use alprazolam 0.25 mg once a day as needed. He understands the importance of avoiding excessive sedation with his history of COPD.

## 2013-12-20 NOTE — Progress Notes (Signed)
Pt has not been treated yet, was in chemo. Pt has appointment with PCP at 3 pm. Will see Dr Valere Dross today for weekly put without RN eval.

## 2013-12-21 ENCOUNTER — Ambulatory Visit: Payer: Commercial Managed Care - HMO

## 2013-12-21 ENCOUNTER — Telehealth: Payer: Self-pay | Admitting: Radiation Oncology

## 2013-12-21 NOTE — Telephone Encounter (Signed)
Received call from [place], RT that patient cancelled treatment appt today because he "is throwing up." Noted compazine and zofran on profile. Called patient to assess status and question if he has tried antiemetics. No answer. Left message requesting return call.

## 2013-12-22 ENCOUNTER — Ambulatory Visit
Admission: RE | Admit: 2013-12-22 | Discharge: 2013-12-22 | Disposition: A | Discharge status: Home / Self Care | Payer: Commercial Managed Care - HMO | Source: Ambulatory Visit | Attending: Radiation Oncology | Admitting: Radiation Oncology

## 2013-12-22 DIAGNOSIS — Z510 Encounter for antineoplastic radiation therapy: Secondary | ICD-10-CM | POA: Diagnosis not present

## 2013-12-23 ENCOUNTER — Ambulatory Visit
Admission: RE | Admit: 2013-12-23 | Discharge: 2013-12-23 | Disposition: A | Discharge status: Home / Self Care | Payer: Commercial Managed Care - HMO | Source: Ambulatory Visit | Attending: Radiation Oncology | Admitting: Radiation Oncology

## 2013-12-23 DIAGNOSIS — Z510 Encounter for antineoplastic radiation therapy: Secondary | ICD-10-CM | POA: Diagnosis not present

## 2013-12-24 ENCOUNTER — Ambulatory Visit
Admission: RE | Admit: 2013-12-24 | Discharge: 2013-12-24 | Disposition: A | Discharge status: Home / Self Care | Payer: Commercial Managed Care - HMO | Source: Ambulatory Visit | Attending: Radiation Oncology | Admitting: Radiation Oncology

## 2013-12-24 DIAGNOSIS — Z510 Encounter for antineoplastic radiation therapy: Secondary | ICD-10-CM | POA: Diagnosis not present

## 2013-12-27 ENCOUNTER — Other Ambulatory Visit (HOSPITAL_BASED_OUTPATIENT_CLINIC_OR_DEPARTMENT_OTHER): Payer: Commercial Managed Care - HMO

## 2013-12-27 ENCOUNTER — Encounter: Payer: Self-pay | Admitting: Radiation Oncology

## 2013-12-27 ENCOUNTER — Ambulatory Visit
Admission: RE | Admit: 2013-12-27 | Discharge: 2013-12-27 | Disposition: A | Discharge status: Home / Self Care | Payer: Commercial Managed Care - HMO | Source: Ambulatory Visit | Attending: Radiation Oncology | Admitting: Radiation Oncology

## 2013-12-27 VITALS — BP 132/61 | HR 81 | Temp 98.9°F | Resp 20 | Wt 189.9 lb

## 2013-12-27 DIAGNOSIS — C3490 Malignant neoplasm of unspecified part of unspecified bronchus or lung: Secondary | ICD-10-CM

## 2013-12-27 DIAGNOSIS — Z510 Encounter for antineoplastic radiation therapy: Secondary | ICD-10-CM | POA: Diagnosis not present

## 2013-12-27 DIAGNOSIS — C3492 Malignant neoplasm of unspecified part of left bronchus or lung: Secondary | ICD-10-CM

## 2013-12-27 DIAGNOSIS — C3410 Malignant neoplasm of upper lobe, unspecified bronchus or lung: Secondary | ICD-10-CM

## 2013-12-27 LAB — BASIC METABOLIC PANEL (CC13)
Anion Gap: 10 meq/L (ref 3–11)
BUN: 12.8 mg/dL (ref 7.0–26.0)
CO2: 26 meq/L (ref 22–29)
Calcium: 9.3 mg/dL (ref 8.4–10.4)
Chloride: 102 meq/L (ref 98–109)
Creatinine: 0.8 mg/dL (ref 0.7–1.3)
Glucose: 185 mg/dL — ABNORMAL HIGH (ref 70–140)
Potassium: 4 meq/L (ref 3.5–5.1)
Sodium: 138 meq/L (ref 136–145)

## 2013-12-27 LAB — CBC WITH DIFFERENTIAL/PLATELET
BASO%: 1.2 % (ref 0.0–2.0)
BASOS ABS: 0.1 10*3/uL (ref 0.0–0.1)
EOS ABS: 0 10*3/uL (ref 0.0–0.5)
EOS%: 0.9 % (ref 0.0–7.0)
HEMATOCRIT: 34.1 % — AB (ref 38.4–49.9)
HGB: 11.6 g/dL — ABNORMAL LOW (ref 13.0–17.1)
LYMPH#: 0.7 10*3/uL — AB (ref 0.9–3.3)
LYMPH%: 13.3 % — ABNORMAL LOW (ref 14.0–49.0)
MCH: 30.4 pg (ref 27.2–33.4)
MCHC: 33.9 g/dL (ref 32.0–36.0)
MCV: 89.5 fL (ref 79.3–98.0)
MONO#: 0.4 10*3/uL (ref 0.1–0.9)
MONO%: 8.5 % (ref 0.0–14.0)
NEUT%: 76.1 % — AB (ref 39.0–75.0)
NEUTROS ABS: 3.8 10*3/uL (ref 1.5–6.5)
Platelets: 132 10*3/uL — ABNORMAL LOW (ref 140–400)
RBC: 3.81 10*6/uL — ABNORMAL LOW (ref 4.20–5.82)
RDW: 22 % — AB (ref 11.0–14.6)
WBC: 5 10*3/uL (ref 4.0–10.3)

## 2013-12-27 NOTE — Progress Notes (Signed)
Weekly Management Note:  Site: Left lung Current Dose:  6200  cGy Projected Dose: 6600  cGy  Narrative: The patient is seen today for routine under treatment assessment. CBCT/MVCT images/port films were reviewed. The chart was reviewed.   He continues to do well and is without new respiratory difficulties. He denies dysphagia. Chemotherapy was completed last week.  Physical Examination:  Filed Vitals:   12/27/13 1527  BP: 132/61  Pulse: 81  Temp: 98.9 F (37.2 C)  Resp: 20  .  Weight: 189 lb 14.4 oz (86.138 kg). No significant skin changes. Lungs are clear.  Laboratory data: Lab Results  Component Value Date   WBC 5.0 12/27/2013   HGB 11.6* 12/27/2013   HCT 34.1* 12/27/2013   MCV 89.5 12/27/2013   PLT 132* 12/27/2013    Impression: Tolerating radiation therapy well.  Plan: Continue radiation therapy as planned. He'll finish his radiation therapy this Wednesday.

## 2013-12-27 NOTE — Progress Notes (Signed)
Pt denies pain, loss of appetite, sore throat. He states his energy level is improving. He states he is hoarse due to "drainage in the back of my throat". Pt will complete treatment in 2 days, gave him 1 month FU card.

## 2013-12-28 ENCOUNTER — Ambulatory Visit
Admission: RE | Admit: 2013-12-28 | Discharge: 2013-12-28 | Disposition: A | Discharge status: Home / Self Care | Payer: Commercial Managed Care - HMO | Source: Ambulatory Visit | Attending: Radiation Oncology | Admitting: Radiation Oncology

## 2013-12-28 ENCOUNTER — Telehealth: Payer: Self-pay | Admitting: *Deleted

## 2013-12-28 DIAGNOSIS — Z510 Encounter for antineoplastic radiation therapy: Secondary | ICD-10-CM | POA: Diagnosis not present

## 2013-12-28 NOTE — Telephone Encounter (Signed)
Message copied by Britt Bottom on Tue Dec 28, 2013 10:05 AM ------      Message from: Curt Bears      Created: Mon Dec 27, 2013  5:10 PM      Regarding: RE: pain clinic       I am not sure how invasive versus proceeding will be but I prefer for him to do it few weeks after completion of his chemoradiation.      ----- Message -----         From: Anders Grant, RN         Sent: 12/27/2013   4:10 PM           To: Curt Bears, MD      Subject: pain clinic                                              Received call from [facility] and they want to see if it is okay for pt to get a RF (radiofrequency) done to his spine.  No steroids are involved.         ------

## 2013-12-28 NOTE — Telephone Encounter (Signed)
Per Dr Vista Mink, pt may have procedure 2-3 weeks after concurrent chemoradiation is completed.  Informed Lauren at [facility].  SLJ

## 2013-12-29 ENCOUNTER — Ambulatory Visit: Payer: Commercial Managed Care - HMO

## 2013-12-29 ENCOUNTER — Encounter: Payer: Self-pay | Admitting: Radiation Oncology

## 2013-12-29 ENCOUNTER — Ambulatory Visit
Admission: RE | Admit: 2013-12-29 | Discharge: 2013-12-29 | Disposition: A | Discharge status: Home / Self Care | Payer: Commercial Managed Care - HMO | Source: Ambulatory Visit | Attending: Radiation Oncology | Admitting: Radiation Oncology

## 2013-12-29 DIAGNOSIS — Z510 Encounter for antineoplastic radiation therapy: Secondary | ICD-10-CM | POA: Diagnosis not present

## 2013-12-29 NOTE — Progress Notes (Signed)
[facility]  End of Treatment Note  Name:Ernest [name]  Date: 12/29/2013 YCX:[id number] DOB:May 28, 1956   Status:outpatient    CC: Drema Pry, DO  Dr. Curt Bears  REFERRING PHYSICIAN: Dr. 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 He appeared to have good tumor regression. His blood counts remain satisfactory.                           PLAN: Routine followup in one month. Patient instructed to call if questions or worsening complaints in interim.

## 2013-12-29 NOTE — Progress Notes (Signed)
Special treatment procedure note: The patient underwent chemoradiation which increase the toxicities associated with radiation therapy. Careful attention was paid to his blood counts. Fortunately, he did not experience significant esophagitis.

## 2014-01-03 ENCOUNTER — Ambulatory Visit: Payer: Commercial Managed Care - HMO

## 2014-01-03 ENCOUNTER — Other Ambulatory Visit (HOSPITAL_BASED_OUTPATIENT_CLINIC_OR_DEPARTMENT_OTHER): Payer: Commercial Managed Care - HMO

## 2014-01-03 DIAGNOSIS — C3490 Malignant neoplasm of unspecified part of unspecified bronchus or lung: Secondary | ICD-10-CM

## 2014-01-03 DIAGNOSIS — C3410 Malignant neoplasm of upper lobe, unspecified bronchus or lung: Secondary | ICD-10-CM

## 2014-01-03 LAB — CBC WITH DIFFERENTIAL/PLATELET
BASO%: 0.4 % (ref 0.0–2.0)
Basophils Absolute: 0 10*3/uL (ref 0.0–0.1)
EOS%: 1 % (ref 0.0–7.0)
Eosinophils Absolute: 0.1 10*3/uL (ref 0.0–0.5)
HEMATOCRIT: 33.6 % — AB (ref 38.4–49.9)
HGB: 11.3 g/dL — ABNORMAL LOW (ref 13.0–17.1)
LYMPH%: 10.1 % — ABNORMAL LOW (ref 14.0–49.0)
MCH: 30.4 pg (ref 27.2–33.4)
MCHC: 33.6 g/dL (ref 32.0–36.0)
MCV: 90.3 fL (ref 79.3–98.0)
MONO#: 0.8 10*3/uL (ref 0.1–0.9)
MONO%: 16 % — AB (ref 0.0–14.0)
NEUT#: 3.7 10*3/uL (ref 1.5–6.5)
NEUT%: 72.5 % (ref 39.0–75.0)
PLATELETS: 123 10*3/uL — AB (ref 140–400)
RBC: 3.72 10*6/uL — AB (ref 4.20–5.82)
RDW: 21.7 % — ABNORMAL HIGH (ref 11.0–14.6)
WBC: 5.1 10*3/uL (ref 4.0–10.3)
lymph#: 0.5 10*3/uL — ABNORMAL LOW (ref 0.9–3.3)
nRBC: 0 % (ref 0–0)

## 2014-01-06 ENCOUNTER — Encounter: Payer: Self-pay | Admitting: Internal Medicine

## 2014-01-06 ENCOUNTER — Ambulatory Visit (INDEPENDENT_AMBULATORY_CARE_PROVIDER_SITE_OTHER): Payer: Medicare HMO | Admitting: Internal Medicine

## 2014-01-06 VITALS — BP 140/78 | HR 77 | Temp 97.9°F | Ht 66.0 in | Wt 194.4 lb

## 2014-01-06 DIAGNOSIS — C3490 Malignant neoplasm of unspecified part of unspecified bronchus or lung: Secondary | ICD-10-CM

## 2014-01-06 DIAGNOSIS — F17200 Nicotine dependence, unspecified, uncomplicated: Secondary | ICD-10-CM

## 2014-01-06 DIAGNOSIS — G4733 Obstructive sleep apnea (adult) (pediatric): Secondary | ICD-10-CM

## 2014-01-06 DIAGNOSIS — J449 Chronic obstructive pulmonary disease, unspecified: Secondary | ICD-10-CM

## 2014-01-06 DIAGNOSIS — B370 Candidal stomatitis: Secondary | ICD-10-CM

## 2014-01-06 DIAGNOSIS — J4489 Other specified chronic obstructive pulmonary disease: Secondary | ICD-10-CM

## 2014-01-06 DIAGNOSIS — J329 Chronic sinusitis, unspecified: Secondary | ICD-10-CM

## 2014-01-06 MED ORDER — DOXYCYCLINE HYCLATE 100 MG PO TABS: ORAL_TABLET | ORAL | Status: DC

## 2014-01-06 MED ORDER — FLUCONAZOLE 100 MG PO TABS: 100.0000 mg | ORAL_TABLET | Freq: Every day | ORAL | Status: DC

## 2014-01-06 NOTE — Patient Instructions (Addendum)
Script sent for doxycycline antibiotic   Script sent for diflucan tabs to clear the yeast "thrush" in your throat.   While you are on this, skip the Lipitor/ atorvastatin

## 2014-01-06 NOTE — Progress Notes (Signed)
Patient ID: Ernest [name], male    DOB: Jan 01, 1956, 58 y.o.   MRN: [id number]  HPI 07/30/11- 58 yoM smoker followed for COPD/ bronchitis, OSA/ CPAP Last here 04/01/11- note reviewed. He had felt better on a trial of maintenance prednisone. He has felt more short of breath through the hot summer, with no acute event and without blood, chest pain or fever. Easy DOE around home. Has been sharing nebulizer with his wife, who now has a cold and we discussed this. Has been worse in last month, and expecially in recent rainy weather. Noting a little ankle swelling. Smothered trying to take a shower.  Only tolerating his CPAP about 3 nights/ week because he feels smothered, waking sore in anterior chest in the mornings.  Now down to only 2-3 cigarettes/ day and trying to stop.  CXR 08/14/10- Nl NAD PFT 05/26/08- FEV1/FVC 2.44/ 0.76, incr 19% w/ BD, R 0.59 ?DLCO 0.69  09/10/11- 58 yoM smoker followed for COPD/ bronchitis, OSA/ CPAP He reports feeling better "finally doing okay". He is down to just a couple of cigarettes a day. He is using CPAP regularly now and is comfortable with it. Alpha I antitrypsin Report came back normal "M. M." on 08/09/2011. Chest x-ray from 08/15/2011 showed stable changes of COPD. He has had flu vaccine.  11/12/11- 58 yoM smoker followed for COPD/ bronchitis, OSA/ CPAP Here with wife. Has had flu shot. He says he was doing well until 2 weeks ago. He again is coughing thick mucus which is difficult to clear. He depends on his metered inhaler and nebulizer to help clear his airways. Started Mucinex 3 days ago. Mucus is white. He denies fever, sore throat, chest pain, GI upset. Because of orthopnea he is sitting up to sleep for the past 2 nights.  01/14/12- 58 yoM smoker followed for COPD/ bronchitis, OSA/ CPAP Increase congested cough green to yellow sputum but he does not feel sick and denies fever. Sister who was a smoker recently died of lung cancer. Despite that and all of our  discussions, he still smokes a few cigarettes daily. We talked again about motivation to stop completely. Continues CPAP and Provigil one or 2 daily.  03/10/12  58 yoM smoker followed for COPD/ bronchitis, OSA/ CPAP Persistent bronchitic cough. Sputum is not purulent. He is "working on" his smoking habit but blames pollen for keeping him S. coughing now. Remains fully compliant with CPAP, all night every night. Still needs Provigil to help with residual daytime sleepiness as before.  05/12/12- 58 yoM smoker followed for COPD/ bronchitis, OSA/ CPAP Stays SOB all the time-even at rest; wheezing, coughing-productive-yellow in color, chest congestion, nasal congestion,. He is more interested today in smoking cessation which we discussed. He had quit for Daliresp after 10 days because of sustained nausea. He is taking all of his regular medicines and using his rescue inhaler several times a day. Off prednisone now for 1-2 months. CXR 01/23/12-  IMPRESSION:  No acute cardiopulmonary abnormality.  Original Report Authenticated By: Randall An, M.D.   07/13/12- 58 yoM smoker followed for COPD/ bronchitis, OSA/ CPAP, complicated by CAD Pt states increased sob,wheezing,productive cough x3 months. Still smoking with no serious effort to stop. His sister has lung cancer which has gotten his attention and he may be willing to try harder. Some days his breathing is better than others with no real trend. Coughing productive of white sputum with no blood. He has all of his medications currently.  07/30/12- 58 yoM  smoker followed for COPD/ bronchitis, OSA/ CPAP, complicated by CAD Cough-productive-yellow and thick; SOB, wheezing, chest congestion, head congestion x 1 week; O2 sat of 83% RA entered room-20 seconds RA sat of 90% CT 07/21/12-  IMPRESSION:  1. Tiny bilateral lung nodules which are similar to on the prior  exam, given differences in slice selection and measurement error.  Likely subpleural lymph  nodes.  2. Age advanced coronary artery atherosclerosis. Recommend  assessment of coronary risk factors and consideration of medical  therapy.  3. Slight enlargement of splenic lesion since 2007. Favored to  represent a hemangioma or lymphangioma.  Original Report Authenticated By: Areta Haber, M.D.   08/07/12- 58 yoM smoker followed for COPD/ bronchitis, OSA/ CPAP, complicated by CAD, disability for back pain. Cough-productive-white mostly; SOB , wheezing-worse at night     wife here Coughing spasms he can feel his upper airway is closing off. He he still smokes a little even when he feels badly and we went over this again today offering support.  11/13/12- 58 yoM smoker followed for COPD/ bronchitis, OSA/ CPAP, complicated by CAD, disability for back pain. FOLLOWS GHW:EXHBZ still but not productive-more SOB  Went to ER in September or acute exacerbation of COPD but not admitted. Today he is a scheduled visit. He actually says he is doing pretty well with no acute problems. Perennial nasal congestion. He has reduced his cigarette smoking to about 2 per day and is strongly encouraged to stop completely now.  01/25/13-  58 yoM smoker followed for COPD/ bronchitis, OSA/ CPAP, complicated by CAD, disability for back pain. ACUTE VISIT: started smoking agian-discuss taking Chantix; feels raw in chest area with deep cough x 2 weeks. He feels well at this visit, meaning he is at his baseline some daily nonproductive cough and shortness of breath with exertion.  05/17/13-58 yoM smoker followed for COPD/ bronchitis, OSA/ CPAP, complicated by CAD, disability for back pain. FOLLOWS JIR:CVELFYBOF is about the same as last visit; has lessened the about the amount he is smoking. Chantix helps. Continues CPAP/ 10/[facility] Discussed smoking cessation effort.  06/10/13- 58 yoM smoker followed for COPD/ bronchitis, OSA/ CPAP, complicated by CAD, disability for back pain. ACUTE VISIT: prod cough with yellow/green  mucus, increased SOB, wheezing, tightness in chest, chills x1 week.  still on augmentin and pred taper from 7/15 phone note > no better Trying Chantix. Wife is also sick. There exposed to his brother's child who has frequent colds. Just started prednisone taper and Augmentin yesterday. Had teeth pulled.  09/16/13- 58 yoM smoker followed for COPD/ bronchitis, OSA/ CPAP, complicated by CAD, disability for back pain. FOLLOWS FOR: was given the flu shot about 1 month and has not felt good since; continues to keep a headache, chest congestion at night. Describes frontal headache, head and chest congestion since a flu shot. Using Chantix, has reduced smoking to 2 or 3 cigarettes daily. Denies infection-no fever, sputum not purulent or bloody. No chest pain. Stays on Mucinex. Not using CPAP because of head congestion. Sister has died of lung cancer/smoker.  Oct 09, 2013- 58 yoM smoker followed for COPD/ bronchitis, OSA/ CPAP, SqCell CA LUL,complicated by CAD, disability for back pain. FOLLOWS FOR: review bx results with patient; continues to have bad frontal lobe headache and lots of congestion. Nausea last night. No vomiting. Abnl CXR > CT chest/ large LUL mass to hilum. We had notified pt and scheduled bx. He is not coughing up much, denies chest pain. New headache mostly left frontal and  retro-orbital x 1 month with no lateralizing neuro. Remains tight on routine meds, off prednisone.  His wife blames "anxiety" for ER trip/ nebulizer the night before his bronchoscopy. Sister recently died of small cell Ca lung.  CT chest 09/17/13- IMPRESSION:  1. 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 This is associated with  some postobstructive changes in the left upper lobe as well as left  hilar and AP window lymphadenopathy. Based on these findings, and  assuming lack of distant metastatic disease (which will have to be  proven by other imaging  studies), this is favored to represent at  least T2b (if not T3 because of satellite nodules in the left upper  lobe), N2, Mx disease (i.e., at least stage IIIA disease). Further  evaluation with PET-CT and brain MRI with and without IV gadolinium  is suggested at this time for complete staging.  2. Mild centrilobular and paraseptal emphysema.  3. Atherosclerosis, including 3 vessel coronary artery disease.  Assessment for potential risk factor modification, dietary therapy  or pharmacologic therapy may be warranted, if clinically indicated.  4. Additional incidental findings, as above.  Electronically Signed  By: Vinnie Langton M.D.  On: 09/22/2013 11:34  Bronch / needle bx 10/04/13- by Dr Lamonte Sakai Pos Squamous Cell Ca  1/23//15- 58 yoM former smoker followed for COPD/ bronchitis, OSA/ CPAP, SqCell CA LUL/ XRT/Chemo, complicated by CAD, disability for back pain. Myocardial Perfusion 11/19/13- EF 65%, Nl wall motion, no ischemic changes. Follows for: frontal lobe headaches have gone away, SOB constantly, some prod cough with white/clear mucous.  Not smoking.  Nearing completion of current rounds of Chemo/ XRT.  Minor sore throat, breathing ok- some SOB, dry cough.  No recent steroids or ABX except Advair.  Continues CPAP 10/ [facility] with no problem.   01/06/13- 58 yoM former smoker followed for COPD/ bronchitis, OSA/ CPAP, SqCell CA LUL/ XRT/Chemo, complicated by CAD, disability for back pain. ACUTE VISIT:  Increase sob, wheezing and cough with congestion head and chest. Also, has bodyaches and sweats Acute illness x1 week started as head congestion moving to his chest with fever, white sputum turning yellow, myalgias. No blood. Had had flu shot. Has completed radiation and chemotherapy pending next oncology followup. Admits smoking 2 cigarettes since last here.  Review of Systems- see HPI Constitutional:   No-   weight loss, night sweats, +fevers, chills,+ fatigue, lassitude. HEENT:  No-  headaches, difficulty swallowing, tooth/dental problems, sore throat,       No-  sneezing, itching, ear ache, nasal congestion, post nasal drip,  CV:   No chest pain, orthopnea, PND,  Little swelling in lower extremities,  No-dizziness, palpitations Resp: +  shortness of breath with exertion or at rest.             + productive cough,  + non-productive cough,  No-  coughing up of blood.              +  change in color of mucus.  +some wheezing.   Skin: No-   rash or lesions. GI:  No-   heartburn, indigestion, abdominal pain, nausea, vomiting, GU:  MS:  No-   joint pain or swelling.   Neuro- nothing unusual  Psych:  No- change in mood or affect. No depression or anxiety.  No memory loss.    Objective:   Physical Exam General- Alert, Oriented, Affect-appropriate, Distress- none acute   Stocky/ overweight,  Skin- rash-none, lesions- none, excoriation- none.      Lymphadenopathy- none Head- atraumatic            Eyes- Gross vision intact, +PERRLA, conjunctivae- injected. + peri-orbital edema            Ears- Hearing, canals- normal            Nose- +turbinate edema, No- Septal dev, +mucus bridging, no-polyps, erosion, perforation             Throat- Mallampati III , +tongue coated/ thrush, drainage- none, tonsils- atrophic,                     + hoarse Neck- flexible , trachea midline, no stridor , thyroid nl, carotid no bruit Chest - symmetrical excursion , unlabored           Heart/CV- RRR , no murmur , no gallop  , no rub, nl s1 s2                           - JVD- none , edema-none, stasis changes- none, varices- none           Lung-+ distant /Coarse, unlabored,  Wheeze+, no- cough , dullness-none, rub- none           Chest wall-  Abd-  Br/ Gen/ Rectal- Not done, not indicated Extrem- cyanosis- none, clubbing, none, atrophy- none, strength- nl. 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 Neuro- grossly intact to observation

## 2014-01-07 ENCOUNTER — Telehealth: Payer: Self-pay | Admitting: Internal Medicine

## 2014-01-07 NOTE — Telephone Encounter (Signed)
ATC NA and no VM WCB

## 2014-01-07 NOTE — Telephone Encounter (Signed)
Called LMTCB x1

## 2014-01-10 NOTE — Telephone Encounter (Signed)
LMTCBx2. Harol Shabazz, CMA  

## 2014-01-13 NOTE — Telephone Encounter (Signed)
lmtcb x3-advised to call back if anything further is needed

## 2014-01-26 ENCOUNTER — Encounter: Payer: Self-pay | Admitting: *Deleted

## 2014-01-28 ENCOUNTER — Encounter (HOSPITAL_COMMUNITY): Payer: Self-pay

## 2014-01-28 ENCOUNTER — Ambulatory Visit (HOSPITAL_COMMUNITY)
Admission: RE | Admit: 2014-01-28 | Discharge: 2014-01-28 | Disposition: A | Discharge status: Home / Self Care | Payer: Medicare HMO | Source: Ambulatory Visit | Attending: Physician Assistant | Admitting: Physician Assistant

## 2014-01-28 ENCOUNTER — Other Ambulatory Visit (HOSPITAL_BASED_OUTPATIENT_CLINIC_OR_DEPARTMENT_OTHER): Payer: Commercial Managed Care - HMO

## 2014-01-28 DIAGNOSIS — C3410 Malignant neoplasm of upper lobe, unspecified bronchus or lung: Secondary | ICD-10-CM

## 2014-01-28 DIAGNOSIS — R5383 Other fatigue: Secondary | ICD-10-CM

## 2014-01-28 DIAGNOSIS — Z9221 Personal history of antineoplastic chemotherapy: Secondary | ICD-10-CM | POA: Insufficient documentation

## 2014-01-28 DIAGNOSIS — C3490 Malignant neoplasm of unspecified part of unspecified bronchus or lung: Secondary | ICD-10-CM

## 2014-01-28 DIAGNOSIS — Z923 Personal history of irradiation: Secondary | ICD-10-CM | POA: Insufficient documentation

## 2014-01-28 DIAGNOSIS — D739 Disease of spleen, unspecified: Secondary | ICD-10-CM | POA: Insufficient documentation

## 2014-01-28 DIAGNOSIS — R5381 Other malaise: Secondary | ICD-10-CM

## 2014-01-28 LAB — CBC WITH DIFFERENTIAL/PLATELET
BASO%: 0.5 % (ref 0.0–2.0)
Basophils Absolute: 0 10*3/uL (ref 0.0–0.1)
EOS%: 5.7 % (ref 0.0–7.0)
Eosinophils Absolute: 0.4 10*3/uL (ref 0.0–0.5)
HCT: 39.1 % (ref 38.4–49.9)
HGB: 13.2 g/dL (ref 13.0–17.1)
LYMPH#: 0.9 10*3/uL (ref 0.9–3.3)
LYMPH%: 13.7 % — ABNORMAL LOW (ref 14.0–49.0)
MCH: 32.3 pg (ref 27.2–33.4)
MCHC: 33.7 g/dL (ref 32.0–36.0)
MCV: 95.8 fL (ref 79.3–98.0)
MONO#: 0.6 10*3/uL (ref 0.1–0.9)
MONO%: 9.3 % (ref 0.0–14.0)
NEUT#: 4.9 10*3/uL (ref 1.5–6.5)
NEUT%: 70.8 % (ref 39.0–75.0)
Platelets: 138 10*3/uL — ABNORMAL LOW (ref 140–400)
RBC: 4.08 10*6/uL — AB (ref 4.20–5.82)
RDW: 19.9 % — AB (ref 11.0–14.6)
WBC: 6.9 10*3/uL (ref 4.0–10.3)

## 2014-01-28 LAB — COMPREHENSIVE METABOLIC PANEL (CC13)
ALT: 18 U/L (ref 0–55)
ANION GAP: 10 meq/L (ref 3–11)
AST: 20 U/L (ref 5–34)
Albumin: 3.7 g/dL (ref 3.5–5.0)
Alkaline Phosphatase: 129 U/L (ref 40–150)
BUN: 9.4 mg/dL (ref 7.0–26.0)
CALCIUM: 9.8 mg/dL (ref 8.4–10.4)
CHLORIDE: 105 meq/L (ref 98–109)
CO2: 28 meq/L (ref 22–29)
Creatinine: 0.8 mg/dL (ref 0.7–1.3)
Glucose: 154 mg/dl — ABNORMAL HIGH (ref 70–140)
POTASSIUM: 3.9 meq/L (ref 3.5–5.1)
SODIUM: 142 meq/L (ref 136–145)
TOTAL PROTEIN: 6.6 g/dL (ref 6.4–8.3)
Total Bilirubin: 0.42 mg/dL (ref 0.20–1.20)

## 2014-01-28 MED ORDER — IOHEXOL 300 MG/ML  SOLN
80.0000 mL | Freq: Once | INTRAMUSCULAR | Status: AC | PRN
  Administered 2014-01-28: 80 mL via INTRAVENOUS

## 2014-01-30 DIAGNOSIS — B370 Candidal stomatitis: Secondary | ICD-10-CM | POA: Insufficient documentation

## 2014-01-30 NOTE — Assessment & Plan Note (Signed)
Counseled against back sliding because a few cigarettes can lead back to regular habit

## 2014-01-30 NOTE — Assessment & Plan Note (Signed)
Coated tongue looks like thrush Plan-try Diflucan

## 2014-01-30 NOTE — Assessment & Plan Note (Addendum)
Acute exacerbation consistent with a viral bronchitis, complicated by recent chemotherapy and radiation therapy Plan-doxycycline

## 2014-01-30 NOTE — Assessment & Plan Note (Signed)
Compliant with CPAP, supervised by his wife

## 2014-01-30 NOTE — Assessment & Plan Note (Signed)
Managed by oncology

## 2014-02-01 ENCOUNTER — Ambulatory Visit
Admission: RE | Admit: 2014-02-01 | Discharge: 2014-02-01 | Disposition: A | Discharge status: Home / Self Care | Payer: Medicare HMO | Source: Ambulatory Visit | Attending: Radiation Oncology | Admitting: Radiation Oncology

## 2014-02-01 ENCOUNTER — Encounter: Payer: Self-pay | Admitting: Internal Medicine

## 2014-02-01 ENCOUNTER — Encounter: Payer: Self-pay | Admitting: Radiation Oncology

## 2014-02-01 ENCOUNTER — Ambulatory Visit (HOSPITAL_BASED_OUTPATIENT_CLINIC_OR_DEPARTMENT_OTHER): Payer: Commercial Managed Care - HMO | Admitting: Internal Medicine

## 2014-02-01 ENCOUNTER — Telehealth: Payer: Self-pay | Admitting: Internal Medicine

## 2014-02-01 VITALS — BP 134/51 | HR 67 | Temp 98.1°F | Resp 17 | Ht 66.0 in | Wt 188.6 lb

## 2014-02-01 VITALS — BP 134/51 | HR 67 | Temp 98.1°F | Resp 20 | Wt 188.6 lb

## 2014-02-01 DIAGNOSIS — C3492 Malignant neoplasm of unspecified part of left bronchus or lung: Secondary | ICD-10-CM

## 2014-02-01 DIAGNOSIS — C3490 Malignant neoplasm of unspecified part of unspecified bronchus or lung: Secondary | ICD-10-CM

## 2014-02-01 DIAGNOSIS — C3410 Malignant neoplasm of upper lobe, unspecified bronchus or lung: Secondary | ICD-10-CM

## 2014-02-01 NOTE — Progress Notes (Signed)
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  PRIOR THERAPY: Concurrent chemoradiation with weekly carboplatin for AUC of 2 and paclitaxel 45 mg/M2, status post 7 cycles, last dose was given 12/20/2013. First dose on 11/01/2013.    CURRENT THERAPY: consolidation chemotherapy with carboplatin for AUC of 5 and paclitaxel 175 mg/M2 every 3 weeks with Neulasta support. First cycle on 02/08/2014.  CHEMOTHERAPY INTENT: Curative/control  CURRENT # OF CHEMOTHERAPY CYCLES: 1  CURRENT ANTIEMETICS: Zofran, dexamethasone and Compazine  CURRENT SMOKING STATUS: Former smoker  ORAL CHEMOTHERAPY AND CONSENT: None  CURRENT BISPHOSPHONATES USE: None  PAIN MANAGEMENT: 0/10  NARCOTICS INDUCED CONSTIPATION: None.  LIVING WILL AND CODE STATUS: Full code.   INTERVAL HISTORY: Ernest [name] 58 y.o. male returns to the clinic today for followup visit accompanied by his wife. The patient is feeling fine today with no specific complaints. He tolerated his previous course of concurrent chemotherapy and radiation fairly well with no significant adverse effects. His weight is stable. The patient denied having any significant chest pain, shortness of breath, cough or hemoptysis. He has no nausea or vomiting. He denied having any significant fever or chills. He had repeat CT scan of the chest performed recently and he is here for evaluation and discussion of his scan results.  MEDICAL HISTORY: Past Medical History  Diagnosis Date  . CAD (coronary artery disease)     Left Main 30% stenosis, LAD 20 - 30 % stenosis, first and second diagonal branchesat 40 - 50%  stenosis with small arteries,  circumflex had 30% stenosis in the large obtuse marginal, RCA at 70 - 80%  stenosis [not felt to be occlusive after evaluation with flow wire], distal 50 - 60% stenosis - [name][  . COPD (chronic obstructive pulmonary disease)     Dr. Baird Lyons  . Depression   . Anxiety   . Hyperlipidemia   . Chronic insomnia   . Gout   . GERD (gastroesophageal reflux disease)   . PVD (peripheral vascular disease)     (ABI 0.9 on right and 0.89 on left)  severe left external iliac stenosis.  He  had successful stenting of his left external iliac per Dr. Burt Knack.  . DDD (degenerative disc disease)   . Hx of colonoscopy   . 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 Cancer     lung  . OSA (obstructive sleep apnea)     NPSG 09/10/10- AHI 11.3/hr  . Hypertension     dr Percival Spanish  . Lung cancer 10/04/13    LUL squamous cell lung cancer  . History of radiation therapy 11/10/13- 12/29/13    left lung 6600 cGy in 33 sessions    ALLERGIES:  has No Known Allergies.  MEDICATIONS:  Current Outpatient Prescriptions  Medication Sig Dispense Refill  . albuterol (PROVENTIL HFA;VENTOLIN HFA) 108 (90 BASE) MCG/ACT inhaler Inhale 2 puffs into the lungs every 4 (four) hours as needed for wheezing or shortness of breath.      [name] albuterol (PROVENTIL) (2.5 MG/3ML) 0.083% nebulizer solution Take 2.5 mg by nebulization every 4 (four) hours as needed for wheezing or shortness of breath.      . ALPRAZolam (XANAX) 0.25 MG tablet Take 1 tablet (  0.25 mg total) by mouth daily as needed for anxiety.  30 tablet  2  . amLODipine (NORVASC) 10 MG tablet TAKE 1 TABLET (10 MG TOTAL) BY MOUTH DAILY.  30 tablet  6  . ARIPiprazole (ABILIFY) 5 MG tablet Take 1 tablet (5 mg total) by mouth at bedtime.      [name] atorvastatin (LIPITOR) 20 MG tablet Take 1 tablet (20 mg total) by mouth daily.  90 tablet  3  . carvedilol (COREG) 25 MG tablet Take 1 tablet (25 mg total) by mouth 2 (two) times daily with a meal.  180 tablet  3  . clonazePAM  (KLONOPIN) 1 MG tablet Take 1 mg by mouth at bedtime as needed. For anxiety/insomnia      . esomeprazole (NEXIUM) 40 MG capsule Take 1 capsule (40 mg total) by mouth daily before breakfast.  90 capsule  3  . fluconazole (DIFLUCAN) 100 MG tablet Take 1 tablet (100 mg total) by mouth daily.  4 tablet  0  . FLUoxetine (PROZAC) 40 MG capsule Take 40 mg by mouth daily.        . Fluticasone-Salmeterol (ADVAIR DISKUS) 250-50 MCG/DOSE AEPB Inhale 1 puff into the lungs 2 (two) times daily.  1 each  3  . gabapentin (NEURONTIN) 100 MG capsule Take 100 mg by mouth daily.       . Indacaterol Maleate (ARCAPTA NEOHALER) 75 MCG CAPS Place 1 capsule into inhaler and inhale daily.      . isosorbide mononitrate (IMDUR) 30 MG 24 hr tablet Take 1 tablet (30 mg total) by mouth daily.  30 tablet  3  . losartan (COZAAR) 25 MG tablet Take 1 tablet (25 mg total) by mouth daily.  90 tablet  3  . methocarbamol (ROBAXIN) 500 MG tablet Take 500 mg by mouth 2 (two) times daily.       . mirtazapine (REMERON) 15 MG tablet Take 1.5 tablets (22.5 mg total) by mouth at bedtime.  90 tablet  1  . modafinil (PROVIGIL) 200 MG tablet Take 200 mg by mouth daily.       [name] morphine (MS CONTIN) 60 MG 12 hr tablet Take 60 mg by mouth 2 (two) times daily.        . nitroGLYCERIN (NITROSTAT) 0.4 MG SL tablet Place 1 tablet (0.4 mg total) under the tongue every 5 (five) minutes as needed for chest pain.  25 tablet  3  . Omega-3 Fatty Acids (FISH OIL CONCENTRATE) 1000 MG CAPS Take 1,000 mg by mouth daily.       . potassium chloride SA (K-DUR,KLOR-CON) 20 MEQ tablet Take 20 mEq by mouth daily.      . VOLTAREN 1 % GEL Apply 2 g topically daily as needed (for pain).        No current facility-administered medications for this visit.    SURGICAL HISTORY:  Past Surgical History  Procedure Laterality Date  . Spinal fusion  03/05/2007    L4-L5  . Hip surgery      left  . Arm surgery      left  . Shoulder surgery      right  . C-spine surgery     . Angioplasty    . Video bronchoscopy with endobronchial navigation N/A 10/04/2013    Procedure: VIDEO BRONCHOSCOPY WITH ENDOBRONCHIAL NAVIGATION;  Surgeon: Collene Gobble, MD;  Location: [facility] OR;  Service: Thoracic;  Laterality: N/A;  . Back surgery      REVIEW OF SYSTEMS:  Constitutional: negative Eyes:  negative Ears, nose, mouth, throat, and face: negative Respiratory: negative Cardiovascular: negative Gastrointestinal: negative Genitourinary:negative Integument/breast: negative Hematologic/lymphatic: negative Musculoskeletal:negative Neurological: negative Behavioral/Psych: negative Endocrine: negative Allergic/Immunologic: negative   PHYSICAL EXAMINATION: General appearance: alert, cooperative and no distress Head: Normocephalic, without obvious abnormality, atraumatic Neck: no adenopathy, no JVD, supple, symmetrical, trachea midline and thyroid not enlarged, symmetric, no tenderness/mass/nodules Lymph nodes: Cervical, supraclavicular, and axillary nodes normal. Resp: clear to auscultation bilaterally Back: symmetric, no curvature. ROM normal. No CVA tenderness. Cardio: regular rate and rhythm, S1, S2 normal, no murmur, click, rub or gallop GI: soft, non-tender; bowel sounds normal; no masses,  no organomegaly Extremities: extremities normal, atraumatic, no cyanosis or edema Neurologic: Alert and oriented X 3, normal strength and tone. Normal symmetric reflexes. Normal coordination and gait  ECOG PERFORMANCE STATUS: 0 - Asymptomatic  Blood pressure 134/51, pulse 67, temperature 98.1 F (36.7 C), temperature source Oral, resp. rate 17, height 5\' 6"  (1.676 m), weight 188 lb 9.6 oz (85.548 kg), SpO2 94.00%.  LABORATORY DATA: Lab Results  Component Value Date   WBC 6.9 01/28/2014   HGB 13.2 01/28/2014   HCT 39.1 01/28/2014   MCV 95.8 01/28/2014   PLT 138* 01/28/2014      Chemistry      Component Value Date/Time   NA 142 01/28/2014 0918   NA 136 10/08/2013 0958   K 3.9  01/28/2014 0918   K 3.1* 10/08/2013 0958   CL 100 10/08/2013 0958   CO2 28 01/28/2014 0918   CO2 29 10/08/2013 0958   BUN 9.4 01/28/2014 0918   BUN 10 10/08/2013 0958   CREATININE 0.8 01/28/2014 0918   CREATININE 0.8 10/08/2013 0958      Component Value Date/Time   CALCIUM 9.8 01/28/2014 0918   CALCIUM 9.5 10/08/2013 0958   ALKPHOS 129 01/28/2014 0918   ALKPHOS 141* 10/01/2013 1400   AST 20 01/28/2014 0918   AST 18 10/01/2013 1400   ALT 18 01/28/2014 0918   ALT 19 10/01/2013 1400   BILITOT 0.42 01/28/2014 0918   BILITOT 0.3 10/01/2013 1400       RADIOGRAPHIC STUDIES: Ct Chest W Contrast  01/28/2014   CLINICAL DATA:  Lung cancer diagnosed in December 2014. Chemotherapy and radiation therapy completed.  EXAM: CT CHEST WITH CONTRAST  TECHNIQUE: Multidetector CT imaging of the chest was performed during intravenous contrast administration.  CONTRAST:  71mL OMNIPAQUE IOHEXOL 300 MG/ML  SOLN  COMPARISON:  NM PET IMAGE INITIAL (PI) SKULL BASE TO THIGH dated 10/19/2013; DG CHEST 1V PORT dated 10/04/2013; CT CHEST W/CM dated 09/22/2013  FINDINGS: There has been significant interval cavitation of the dominant suprahilar left upper lobe mass. This lesion now measures 3.2 x 2.8 cm on image 16 and has irregular relatively thin walls. This likely communicates with the left upper lobe bronchus, best seen on the coronal images. There is a residual adjacent 1.4 cm solid component more superiorly on image 10. An ill-defined ground-glass density more peripherally in the left upper lobe on image 17 has probably not significantly changed. There is a small ground-glass density in the right upper lobe on image 31 which was not present previously. No new or enlarging pulmonary nodules are identified.  There is a new small dependent left pleural effusion. There is no right pleural effusion or pericardial effusion. The previously demonstrated prominent AP window and left hilar nodes have decreased in size. There are no pathologically  enlarged mediastinal or hilar lymph nodes.  Atherosclerosis of the aorta, coronary arteries and great  vessels is again noted. The heart size is normal. The visualized upper abdomen has a stable appearance with a stable septated splenic lesion measuring 4.6 cm on image 67.  There are no worrisome osseous findings.  IMPRESSION: 1. Interval significant cavitation and retraction of the dominant left upper lobe suprahilar mass. This now likely communicates with the left upper lobe bronchus. An adjacent solid nodule more superiorly at the left apex is now better defined, but not significantly larger. 2. Nonspecific patchy ground-glass opacities in both upper lobes. . Attention on follow-up recommended. 3. Improvement in AP window and left hilar adenopathy. 4. Stable septated cystic splenic lesion.   Electronically Signed   By: Camie Patience M.D.   On: 01/28/2014 11:49   ASSESSMENT AND PLAN: This is a very pleasant 58 years old white male with stage IIIA non-small cell lung cancer, squamous cell carcinoma currently undergoing concurrent chemoradiation with weekly carboplatin and paclitaxel status post 7 weeks of treatment. He tolerated his treatment fairly well with no significant adverse effects. His recent scan showed significant improvement in his disease. I discussed the scan results and showed the images to the patient and his wife. I gave him the option of continuous observation versus proceeding with consolidation chemotherapy with 3 cycles of carboplatin for AUC of 5 and paclitaxel 175 mg/M2 giving every 3 weeks with Neulasta. I discussed with the patient adverse effect of this treatment including but not limited to alopecia, myelosuppression, nausea and vomiting, peripheral neuropathy, liver or renal dysfunction. The patient would like to proceed with the consolidation chemotherapy and he is expected to start the first cycle of this treatment next week. He would come back for followup visit in 4 weeks with  the start of cycle #2. He was advised to call immediately if he has any concerning symptoms in the interval. The patient voices understanding of current disease status and treatment options and is in agreement with the current care plan.  All questions were answered. The patient knows to call the clinic with any problems, questions or concerns. We can certainly see the patient much sooner if necessary.  I spent 15 minutes counseling the patient face to face. The total time spent in the appointment was 25 minutes.  Disclaimer: This note was dictated with voice recognition software. Similar sounding words can inadvertently be transcribed and may not be corrected upon review.

## 2014-02-01 NOTE — Progress Notes (Signed)
Pt just seen by Dr Julien Nordmann, med onc. Pt denies pain, fatigue, loss of appetite. He has productive cough w/yellow sputum, SOB w/exertion. Pt states he has 3 more chemotherapy sessions to complete.

## 2014-02-01 NOTE — Progress Notes (Signed)
Wife came in and wanted application for asst. She says bills are over 5000.00. I gave her Lenise's card to send back income for her and hubby(the patient) to see if he can get the 400.00 grant. She will send tax info to [employer]. They need some help with some of his meds.

## 2014-02-01 NOTE — Telephone Encounter (Signed)
gv adn printed appt sched and avs for pt for March and April.....sed added tx.

## 2014-02-01 NOTE — Progress Notes (Signed)
Followup note:  Ernest [name] returns today approximately 1 month following completion of chest radiation in the management of his T3 N2 squamous cell carcinoma of the left lung. He is generally doing well. He saw [name] this morning. He has 3 more cycles of chemotherapy. His CT scan of the chest on 01/28/2014 showed significant regression of his left upper lobe suprahilar mass. There is improvement in AP window and left hilar adenopathy. The adjacent solid nodule left apex is without interval change. His dyspnea on exertion is unchanged. He still has a cough productive of yellow phlegm. No hemoptysis.  Physical examination: Alert and oriented. Filed Vitals:   02/01/14 1054  BP: 134/51  Pulse: 67  Temp: 98.1 F (36.7 C)  Resp: 20   Head and neck examination: Grossly unremarkable. Nodes: Without palpable cervical or supraclavicular lymphadenopathy. Chest: Breath sounds distant, otherwise clear. Abdomen: Without hepatomegaly. Extremities: Without edema.  Laboratory data: Lab Results  Component Value Date   WBC 6.9 01/28/2014   HGB 13.2 01/28/2014   HCT 39.1 01/28/2014   MCV 95.8 01/28/2014   PLT 138* 01/28/2014   Impression: Satisfactory progress.  Plan: He has 3 more cycles of chemotherapy. Followup through [name].

## 2014-02-08 ENCOUNTER — Ambulatory Visit (HOSPITAL_BASED_OUTPATIENT_CLINIC_OR_DEPARTMENT_OTHER): Payer: Medicare HMO

## 2014-02-08 ENCOUNTER — Other Ambulatory Visit (HOSPITAL_BASED_OUTPATIENT_CLINIC_OR_DEPARTMENT_OTHER): Payer: Commercial Managed Care - HMO

## 2014-02-08 VITALS — BP 128/54 | HR 73 | Temp 96.9°F | Resp 18

## 2014-02-08 DIAGNOSIS — C3410 Malignant neoplasm of upper lobe, unspecified bronchus or lung: Secondary | ICD-10-CM

## 2014-02-08 DIAGNOSIS — C3492 Malignant neoplasm of unspecified part of left bronchus or lung: Secondary | ICD-10-CM

## 2014-02-08 DIAGNOSIS — C3490 Malignant neoplasm of unspecified part of unspecified bronchus or lung: Secondary | ICD-10-CM

## 2014-02-08 DIAGNOSIS — Z5111 Encounter for antineoplastic chemotherapy: Secondary | ICD-10-CM

## 2014-02-08 LAB — COMPREHENSIVE METABOLIC PANEL (CC13)
ALK PHOS: 134 U/L (ref 40–150)
ALT: 18 U/L (ref 0–55)
AST: 16 U/L (ref 5–34)
Albumin: 3.8 g/dL (ref 3.5–5.0)
Anion Gap: 14 mEq/L — ABNORMAL HIGH (ref 3–11)
BILIRUBIN TOTAL: 0.33 mg/dL (ref 0.20–1.20)
BUN: 10.3 mg/dL (ref 7.0–26.0)
CO2: 25 meq/L (ref 22–29)
CREATININE: 0.8 mg/dL (ref 0.7–1.3)
Calcium: 9.7 mg/dL (ref 8.4–10.4)
Chloride: 104 mEq/L (ref 98–109)
GLUCOSE: 112 mg/dL (ref 70–140)
Potassium: 3.5 mEq/L (ref 3.5–5.1)
Sodium: 143 mEq/L (ref 136–145)
Total Protein: 6.8 g/dL (ref 6.4–8.3)

## 2014-02-08 LAB — CBC WITH DIFFERENTIAL/PLATELET
BASO%: 0.3 % (ref 0.0–2.0)
BASOS ABS: 0 10*3/uL (ref 0.0–0.1)
EOS%: 4.6 % (ref 0.0–7.0)
Eosinophils Absolute: 0.3 10*3/uL (ref 0.0–0.5)
HCT: 41.1 % (ref 38.4–49.9)
HGB: 13.6 g/dL (ref 13.0–17.1)
LYMPH%: 17.8 % (ref 14.0–49.0)
MCH: 31.9 pg (ref 27.2–33.4)
MCHC: 33.1 g/dL (ref 32.0–36.0)
MCV: 96.5 fL (ref 79.3–98.0)
MONO#: 0.5 10*3/uL (ref 0.1–0.9)
MONO%: 7.9 % (ref 0.0–14.0)
NEUT#: 4.4 10*3/uL (ref 1.5–6.5)
NEUT%: 69.4 % (ref 39.0–75.0)
Platelets: 138 10*3/uL — ABNORMAL LOW (ref 140–400)
RBC: 4.26 10*6/uL (ref 4.20–5.82)
RDW: 16.1 % — AB (ref 11.0–14.6)
WBC: 6.3 10*3/uL (ref 4.0–10.3)
lymph#: 1.1 10*3/uL (ref 0.9–3.3)
nRBC: 0 % (ref 0–0)

## 2014-02-08 MED ORDER — FAMOTIDINE IN NACL 20-0.9 MG/50ML-% IV SOLN
20.0000 mg | Freq: Once | INTRAVENOUS | Status: AC
  Administered 2014-02-08: 20 mg via INTRAVENOUS

## 2014-02-08 MED ORDER — CARBOPLATIN CHEMO INJECTION 600 MG/60ML
726.0000 mg | Freq: Once | INTRAVENOUS | Status: AC
  Administered 2014-02-08: 730 mg via INTRAVENOUS
  Filled 2014-02-08: qty 73

## 2014-02-08 MED ORDER — PACLITAXEL CHEMO INJECTION 300 MG/50ML
175.0000 mg/m2 | Freq: Once | INTRAVENOUS | Status: AC
  Administered 2014-02-08: 348 mg via INTRAVENOUS
  Filled 2014-02-08: qty 58

## 2014-02-08 MED ORDER — DEXAMETHASONE SODIUM PHOSPHATE 20 MG/5ML IJ SOLN
INTRAMUSCULAR | Status: AC
  Filled 2014-02-08: qty 5

## 2014-02-08 MED ORDER — FAMOTIDINE IN NACL 20-0.9 MG/50ML-% IV SOLN
INTRAVENOUS | Status: AC
  Filled 2014-02-08: qty 50

## 2014-02-08 MED ORDER — DIPHENHYDRAMINE HCL 50 MG/ML IJ SOLN
INTRAMUSCULAR | Status: AC
  Filled 2014-02-08: qty 1

## 2014-02-08 MED ORDER — DEXAMETHASONE SODIUM PHOSPHATE 20 MG/5ML IJ SOLN
20.0000 mg | Freq: Once | INTRAMUSCULAR | Status: AC
  Administered 2014-02-08: 20 mg via INTRAVENOUS

## 2014-02-08 MED ORDER — SODIUM CHLORIDE 0.9 % IV SOLN
Freq: Once | INTRAVENOUS | Status: AC
  Administered 2014-02-08: 10:00:00 via INTRAVENOUS

## 2014-02-08 MED ORDER — ONDANSETRON 16 MG/50ML IVPB (CHCC)
INTRAVENOUS | Status: AC
  Filled 2014-02-08: qty 16

## 2014-02-08 MED ORDER — ONDANSETRON 16 MG/50ML IVPB (CHCC)
16.0000 mg | Freq: Once | INTRAVENOUS | Status: AC
  Administered 2014-02-08: 16 mg via INTRAVENOUS

## 2014-02-08 MED ORDER — DIPHENHYDRAMINE HCL 50 MG/ML IJ SOLN
50.0000 mg | Freq: Once | INTRAMUSCULAR | Status: AC
  Administered 2014-02-08: 50 mg via INTRAVENOUS

## 2014-02-08 NOTE — Patient Instructions (Signed)
Agency Discharge Instructions for Patients Receiving Chemotherapy  Today you received the following chemotherapy agents: Taxol and Carboplatin   To help prevent nausea and vomiting after your treatment, we encourage you to take your nausea medication as prescribes.    If you develop nausea and vomiting that is not controlled by your nausea medication, call the clinic.   BELOW ARE SYMPTOMS THAT SHOULD BE REPORTED IMMEDIATELY:  *FEVER GREATER THAN 100.5 F  *CHILLS WITH OR WITHOUT FEVER  NAUSEA AND VOMITING THAT IS NOT CONTROLLED WITH YOUR NAUSEA MEDICATION  *UNUSUAL SHORTNESS OF BREATH  *UNUSUAL BRUISING OR BLEEDING  TENDERNESS IN MOUTH AND THROAT WITH OR WITHOUT PRESENCE OF ULCERS  *URINARY PROBLEMS  *BOWEL PROBLEMS  UNUSUAL RASH Items with * indicate a potential emergency and should be followed up as soon as possible.  Feel free to call the clinic you have any questions or concerns. The clinic phone number is [phone].

## 2014-02-09 ENCOUNTER — Telehealth: Payer: Self-pay | Admitting: *Deleted

## 2014-02-09 ENCOUNTER — Ambulatory Visit (HOSPITAL_BASED_OUTPATIENT_CLINIC_OR_DEPARTMENT_OTHER): Payer: Commercial Managed Care - HMO

## 2014-02-09 VITALS — BP 151/59 | HR 86 | Temp 98.1°F

## 2014-02-09 DIAGNOSIS — C3490 Malignant neoplasm of unspecified part of unspecified bronchus or lung: Secondary | ICD-10-CM

## 2014-02-09 DIAGNOSIS — C3410 Malignant neoplasm of upper lobe, unspecified bronchus or lung: Secondary | ICD-10-CM

## 2014-02-09 DIAGNOSIS — Z5189 Encounter for other specified aftercare: Secondary | ICD-10-CM

## 2014-02-09 MED ORDER — PEGFILGRASTIM INJECTION 6 MG/0.6ML
6.0000 mg | Freq: Once | SUBCUTANEOUS | Status: AC
  Administered 2014-02-09: 6 mg via SUBCUTANEOUS
  Filled 2014-02-09: qty 0.6

## 2014-02-09 NOTE — Telephone Encounter (Signed)
Ernest [name] here for Neulasta injection following increase dose TC chemo treatment.  States that he is doing well  Only slight nausea which has been relieved with the antiemetics.  Is drinking and eat small amounts   All questions answered.  Knows to call if he has any problems or concerns.

## 2014-02-09 NOTE — Patient Instructions (Signed)

## 2014-02-14 ENCOUNTER — Encounter (HOSPITAL_COMMUNITY): Payer: Self-pay | Admitting: Emergency Medicine

## 2014-02-14 ENCOUNTER — Inpatient Hospital Stay (HOSPITAL_COMMUNITY)
Admission: EM | Admit: 2014-02-14 | Discharge: 2014-02-17 | DRG: 871 | Disposition: A | Discharge status: Home / Self Care | Payer: Medicare HMO | Attending: Internal Medicine | Admitting: Internal Medicine

## 2014-02-14 DIAGNOSIS — F329 Major depressive disorder, single episode, unspecified: Secondary | ICD-10-CM

## 2014-02-14 DIAGNOSIS — C3410 Malignant neoplasm of upper lobe, unspecified bronchus or lung: Secondary | ICD-10-CM | POA: Diagnosis present

## 2014-02-14 DIAGNOSIS — T451X5A Adverse effect of antineoplastic and immunosuppressive drugs, initial encounter: Secondary | ICD-10-CM | POA: Diagnosis present

## 2014-02-14 DIAGNOSIS — R0789 Other chest pain: Secondary | ICD-10-CM | POA: Diagnosis present

## 2014-02-14 DIAGNOSIS — J00 Acute nasopharyngitis [common cold]: Secondary | ICD-10-CM

## 2014-02-14 DIAGNOSIS — J441 Chronic obstructive pulmonary disease with (acute) exacerbation: Secondary | ICD-10-CM

## 2014-02-14 DIAGNOSIS — S39011A Strain of muscle, fascia and tendon of abdomen, initial encounter: Secondary | ICD-10-CM

## 2014-02-14 DIAGNOSIS — I2510 Atherosclerotic heart disease of native coronary artery without angina pectoris: Secondary | ICD-10-CM

## 2014-02-14 DIAGNOSIS — M109 Gout, unspecified: Secondary | ICD-10-CM

## 2014-02-14 DIAGNOSIS — Z923 Personal history of irradiation: Secondary | ICD-10-CM

## 2014-02-14 DIAGNOSIS — R079 Chest pain, unspecified: Secondary | ICD-10-CM

## 2014-02-14 DIAGNOSIS — R5383 Other fatigue: Secondary | ICD-10-CM

## 2014-02-14 DIAGNOSIS — R5381 Other malaise: Secondary | ICD-10-CM

## 2014-02-14 DIAGNOSIS — A419 Sepsis, unspecified organism: Principal | ICD-10-CM

## 2014-02-14 DIAGNOSIS — Z8249 Family history of ischemic heart disease and other diseases of the circulatory system: Secondary | ICD-10-CM

## 2014-02-14 DIAGNOSIS — D638 Anemia in other chronic diseases classified elsewhere: Secondary | ICD-10-CM | POA: Diagnosis present

## 2014-02-14 DIAGNOSIS — Z981 Arthrodesis status: Secondary | ICD-10-CM

## 2014-02-14 DIAGNOSIS — J449 Chronic obstructive pulmonary disease, unspecified: Secondary | ICD-10-CM

## 2014-02-14 DIAGNOSIS — D899 Disorder involving the immune mechanism, unspecified: Secondary | ICD-10-CM | POA: Diagnosis present

## 2014-02-14 DIAGNOSIS — I739 Peripheral vascular disease, unspecified: Secondary | ICD-10-CM

## 2014-02-14 DIAGNOSIS — R0603 Acute respiratory distress: Secondary | ICD-10-CM

## 2014-02-14 DIAGNOSIS — K219 Gastro-esophageal reflux disease without esophagitis: Secondary | ICD-10-CM

## 2014-02-14 DIAGNOSIS — D6481 Anemia due to antineoplastic chemotherapy: Secondary | ICD-10-CM | POA: Diagnosis present

## 2014-02-14 DIAGNOSIS — G4700 Insomnia, unspecified: Secondary | ICD-10-CM | POA: Diagnosis present

## 2014-02-14 DIAGNOSIS — F411 Generalized anxiety disorder: Secondary | ICD-10-CM | POA: Diagnosis present

## 2014-02-14 DIAGNOSIS — Z9189 Other specified personal risk factors, not elsewhere classified: Secondary | ICD-10-CM

## 2014-02-14 DIAGNOSIS — J45901 Unspecified asthma with (acute) exacerbation: Secondary | ICD-10-CM

## 2014-02-14 DIAGNOSIS — Z801 Family history of malignant neoplasm of trachea, bronchus and lung: Secondary | ICD-10-CM

## 2014-02-14 DIAGNOSIS — Z87891 Personal history of nicotine dependence: Secondary | ICD-10-CM

## 2014-02-14 DIAGNOSIS — N419 Inflammatory disease of prostate, unspecified: Secondary | ICD-10-CM

## 2014-02-14 DIAGNOSIS — J189 Pneumonia, unspecified organism: Secondary | ICD-10-CM | POA: Diagnosis present

## 2014-02-14 DIAGNOSIS — G8929 Other chronic pain: Secondary | ICD-10-CM | POA: Diagnosis present

## 2014-02-14 DIAGNOSIS — R059 Cough, unspecified: Secondary | ICD-10-CM

## 2014-02-14 DIAGNOSIS — B370 Candidal stomatitis: Secondary | ICD-10-CM

## 2014-02-14 DIAGNOSIS — C3490 Malignant neoplasm of unspecified part of unspecified bronchus or lung: Secondary | ICD-10-CM

## 2014-02-14 DIAGNOSIS — E785 Hyperlipidemia, unspecified: Secondary | ICD-10-CM | POA: Diagnosis present

## 2014-02-14 DIAGNOSIS — C3491 Malignant neoplasm of unspecified part of right bronchus or lung: Secondary | ICD-10-CM | POA: Diagnosis present

## 2014-02-14 DIAGNOSIS — J4489 Other specified chronic obstructive pulmonary disease: Secondary | ICD-10-CM

## 2014-02-14 DIAGNOSIS — E7800 Pure hypercholesterolemia, unspecified: Secondary | ICD-10-CM

## 2014-02-14 DIAGNOSIS — F3289 Other specified depressive episodes: Secondary | ICD-10-CM

## 2014-02-14 DIAGNOSIS — Z79899 Other long term (current) drug therapy: Secondary | ICD-10-CM

## 2014-02-14 DIAGNOSIS — Z0000 Encounter for general adult medical examination without abnormal findings: Secondary | ICD-10-CM

## 2014-02-14 DIAGNOSIS — F17200 Nicotine dependence, unspecified, uncomplicated: Secondary | ICD-10-CM

## 2014-02-14 DIAGNOSIS — D6959 Other secondary thrombocytopenia: Secondary | ICD-10-CM | POA: Diagnosis present

## 2014-02-14 DIAGNOSIS — R232 Flushing: Secondary | ICD-10-CM

## 2014-02-14 DIAGNOSIS — Z9861 Coronary angioplasty status: Secondary | ICD-10-CM

## 2014-02-14 DIAGNOSIS — R911 Solitary pulmonary nodule: Secondary | ICD-10-CM

## 2014-02-14 DIAGNOSIS — I10 Essential (primary) hypertension: Secondary | ICD-10-CM

## 2014-02-14 DIAGNOSIS — G4733 Obstructive sleep apnea (adult) (pediatric): Secondary | ICD-10-CM

## 2014-02-14 DIAGNOSIS — K029 Dental caries, unspecified: Secondary | ICD-10-CM

## 2014-02-14 DIAGNOSIS — R05 Cough: Secondary | ICD-10-CM

## 2014-02-14 DIAGNOSIS — R739 Hyperglycemia, unspecified: Secondary | ICD-10-CM

## 2014-02-14 NOTE — ED Notes (Signed)
Pt states earlier today he started running a fever Pt states he has felt light headed all day and has had a lot of chest congestion with productive cough  Pt states when he coughs it makes the left side of his chest hurt  Pt has lung cancer and his last dose of chemo was on the 17th   Pt does not have a port a cath

## 2014-02-15 ENCOUNTER — Other Ambulatory Visit: Payer: Medicare HMO

## 2014-02-15 ENCOUNTER — Emergency Department (HOSPITAL_COMMUNITY): Payer: Medicare HMO

## 2014-02-15 ENCOUNTER — Observation Stay (HOSPITAL_COMMUNITY): Payer: Medicare HMO

## 2014-02-15 DIAGNOSIS — C3490 Malignant neoplasm of unspecified part of unspecified bronchus or lung: Secondary | ICD-10-CM

## 2014-02-15 DIAGNOSIS — J441 Chronic obstructive pulmonary disease with (acute) exacerbation: Secondary | ICD-10-CM | POA: Diagnosis present

## 2014-02-15 DIAGNOSIS — I517 Cardiomegaly: Secondary | ICD-10-CM

## 2014-02-15 DIAGNOSIS — I739 Peripheral vascular disease, unspecified: Secondary | ICD-10-CM

## 2014-02-15 DIAGNOSIS — J984 Other disorders of lung: Secondary | ICD-10-CM

## 2014-02-15 DIAGNOSIS — I10 Essential (primary) hypertension: Secondary | ICD-10-CM

## 2014-02-15 DIAGNOSIS — A419 Sepsis, unspecified organism: Secondary | ICD-10-CM | POA: Diagnosis present

## 2014-02-15 DIAGNOSIS — K219 Gastro-esophageal reflux disease without esophagitis: Secondary | ICD-10-CM

## 2014-02-15 DIAGNOSIS — R079 Chest pain, unspecified: Secondary | ICD-10-CM

## 2014-02-15 LAB — CBC WITH DIFFERENTIAL/PLATELET
BASOS ABS: 0.1 10*3/uL (ref 0.0–0.1)
BASOS PCT: 0 % (ref 0–1)
Basophils Absolute: 0 10*3/uL (ref 0.0–0.1)
Basophils Relative: 1 % (ref 0–1)
EOS ABS: 0.1 10*3/uL (ref 0.0–0.7)
EOS PCT: 1 % (ref 0–5)
Eosinophils Absolute: 0.1 10*3/uL (ref 0.0–0.7)
Eosinophils Relative: 1 % (ref 0–5)
HCT: 37.7 % — ABNORMAL LOW (ref 39.0–52.0)
HEMATOCRIT: 34.9 % — AB (ref 39.0–52.0)
Hemoglobin: 12.5 g/dL — ABNORMAL LOW (ref 13.0–17.0)
Hemoglobin: 11.8 g/dL — ABNORMAL LOW (ref 13.0–17.0)
LYMPHS ABS: 0.9 10*3/uL (ref 0.7–4.0)
LYMPHS ABS: 1.8 10*3/uL (ref 0.7–4.0)
Lymphocytes Relative: 8 % — ABNORMAL LOW (ref 12–46)
Lymphocytes Relative: 19 % (ref 12–46)
MCH: 31.8 pg (ref 26.0–34.0)
MCH: 32.1 pg (ref 26.0–34.0)
MCHC: 33.2 g/dL (ref 30.0–36.0)
MCHC: 33.8 g/dL (ref 30.0–36.0)
MCV: 95.9 fL (ref 78.0–100.0)
MCV: 94.8 fL (ref 78.0–100.0)
MONO ABS: 1.2 10*3/uL — AB (ref 0.1–1.0)
MONOS PCT: 25 % — AB (ref 3–12)
MONOS PCT: 13 % — AB (ref 3–12)
Monocytes Absolute: 2.7 10*3/uL — ABNORMAL HIGH (ref 0.1–1.0)
NEUTROS ABS: 6.5 10*3/uL (ref 1.7–7.7)
Neutro Abs: 7.1 10*3/uL (ref 1.7–7.7)
Neutrophils Relative %: 65 % (ref 43–77)
Neutrophils Relative %: 67 % (ref 43–77)
PLATELETS: 94 10*3/uL — AB (ref 150–400)
PLATELETS: 92 10*3/uL — AB (ref 150–400)
RBC: 3.93 MIL/uL — ABNORMAL LOW (ref 4.22–5.81)
RBC: 3.68 MIL/uL — AB (ref 4.22–5.81)
RDW: 15.4 % (ref 11.5–15.5)
RDW: 15.5 % (ref 11.5–15.5)
WBC: 10.9 10*3/uL — AB (ref 4.0–10.5)
WBC: 9.6 10*3/uL (ref 4.0–10.5)

## 2014-02-15 LAB — TROPONIN I
Troponin I: <0.3 ng/mL (ref <0.30)
Troponin I: <0.3 ng/mL (ref <0.30)
Troponin I: <0.3 ng/mL (ref <0.30)

## 2014-02-15 LAB — COMPREHENSIVE METABOLIC PANEL WITH GFR
ALT: 17 U/L (ref 0–53)
AST: 15 U/L (ref 0–37)
Albumin: 3.4 g/dL — ABNORMAL LOW (ref 3.5–5.2)
Alkaline Phosphatase: 132 U/L — ABNORMAL HIGH (ref 39–117)
BUN: 10 mg/dL (ref 6–23)
CO2: 23 meq/L (ref 19–32)
Calcium: 8.8 mg/dL (ref 8.4–10.5)
Chloride: 94 meq/L — ABNORMAL LOW (ref 96–112)
Creatinine, Ser: 0.83 mg/dL (ref 0.50–1.35)
GFR calc Af Amer: >90 mL/min (ref >90)
GFR calc non Af Amer: >90 mL/min (ref >90)
Glucose, Bld: 159 mg/dL — ABNORMAL HIGH (ref 70–99)
Potassium: 3.8 meq/L (ref 3.7–5.3)
Sodium: 132 meq/L — ABNORMAL LOW (ref 137–147)
Total Bilirubin: 0.6 mg/dL (ref 0.3–1.2)
Total Protein: 6.2 g/dL (ref 6.0–8.3)

## 2014-02-15 LAB — I-STAT CHEM 8, ED
BUN: 9 mg/dL (ref 6–23)
Calcium, Ion: 1.12 mmol/L (ref 1.12–1.23)
Chloride: 97 mEq/L (ref 96–112)
Creatinine, Ser: 0.9 mg/dL (ref 0.50–1.35)
Glucose, Bld: 132 mg/dL — ABNORMAL HIGH (ref 70–99)
HEMATOCRIT: 38 % — AB (ref 39.0–52.0)
HEMOGLOBIN: 12.9 g/dL — AB (ref 13.0–17.0)
Potassium: 3.7 mEq/L (ref 3.7–5.3)
SODIUM: 133 meq/L — AB (ref 137–147)
TCO2: 25 mmol/L (ref 0–100)

## 2014-02-15 LAB — COMPREHENSIVE METABOLIC PANEL
ALBUMIN: 3.6 g/dL (ref 3.5–5.2)
ALT: 20 U/L (ref 0–53)
AST: 19 U/L (ref 0–37)
Alkaline Phosphatase: 144 U/L — ABNORMAL HIGH (ref 39–117)
BUN: 10 mg/dL (ref 6–23)
CALCIUM: 9.1 mg/dL (ref 8.4–10.5)
CO2: 24 mEq/L (ref 19–32)
CREATININE: 0.81 mg/dL (ref 0.50–1.35)
Chloride: 93 mEq/L — ABNORMAL LOW (ref 96–112)
GFR calc Af Amer: >90 mL/min (ref >90)
GFR calc non Af Amer: >90 mL/min (ref >90)
Glucose, Bld: 134 mg/dL — ABNORMAL HIGH (ref 70–99)
Potassium: 3.9 mEq/L (ref 3.7–5.3)
Sodium: 133 mEq/L — ABNORMAL LOW (ref 137–147)
TOTAL PROTEIN: 6.3 g/dL (ref 6.0–8.3)
Total Bilirubin: 0.6 mg/dL (ref 0.3–1.2)

## 2014-02-15 LAB — URINALYSIS, ROUTINE W REFLEX MICROSCOPIC
Bilirubin Urine: NEGATIVE
GLUCOSE, UA: NEGATIVE mg/dL
Hgb urine dipstick: NEGATIVE
Ketones, ur: NEGATIVE mg/dL
LEUKOCYTES UA: NEGATIVE
Nitrite: NEGATIVE
PH: 8 (ref 5.0–8.0)
Protein, ur: NEGATIVE mg/dL
SPECIFIC GRAVITY, URINE: 1.02 (ref 1.005–1.030)
Urobilinogen, UA: 1 mg/dL (ref 0.0–1.0)

## 2014-02-15 LAB — INFLUENZA PANEL BY PCR (TYPE A & B)
H1N1 flu by pcr: NOT DETECTED
Influenza A By PCR: NEGATIVE
Influenza B By PCR: NEGATIVE

## 2014-02-15 LAB — I-STAT TROPONIN, ED: Troponin i, poc: 0.04 ng/mL (ref 0.00–0.08)

## 2014-02-15 LAB — STREP PNEUMONIAE URINARY ANTIGEN: Strep Pneumo Urinary Antigen: NEGATIVE

## 2014-02-15 LAB — LACTIC ACID, PLASMA: Lactic Acid, Venous: 1.4 mmol/L (ref 0.5–2.2)

## 2014-02-15 LAB — PATHOLOGIST SMEAR REVIEW

## 2014-02-15 MED ORDER — ALBUTEROL SULFATE (2.5 MG/3ML) 0.083% IN NEBU
5.0000 mg | INHALATION_SOLUTION | Freq: Once | RESPIRATORY_TRACT | Status: AC
  Administered 2014-02-15: 5 mg via RESPIRATORY_TRACT
  Filled 2014-02-15: qty 6

## 2014-02-15 MED ORDER — ALBUTEROL SULFATE HFA 108 (90 BASE) MCG/ACT IN AERS: 2.0000 | INHALATION_SPRAY | RESPIRATORY_TRACT | Status: DC | PRN

## 2014-02-15 MED ORDER — OXYCODONE-ACETAMINOPHEN 5-325 MG PO TABS: 1.0000 | ORAL_TABLET | Freq: Four times a day (QID) | ORAL | Status: DC | PRN

## 2014-02-15 MED ORDER — OXYCODONE HCL 5 MG PO TABS: 5.0000 mg | ORAL_TABLET | Freq: Four times a day (QID) | ORAL | Status: DC | PRN

## 2014-02-15 MED ORDER — ALBUTEROL SULFATE (2.5 MG/3ML) 0.083% IN NEBU: 2.5000 mg | INHALATION_SOLUTION | RESPIRATORY_TRACT | Status: DC | PRN

## 2014-02-15 MED ORDER — IPRATROPIUM-ALBUTEROL 0.5-2.5 (3) MG/3ML IN SOLN
3.0000 mL | RESPIRATORY_TRACT | Status: DC
  Administered 2014-02-15 (×2): 3 mL via RESPIRATORY_TRACT
  Filled 2014-02-15 (×2): qty 3

## 2014-02-15 MED ORDER — PANTOPRAZOLE SODIUM 40 MG PO TBEC
40.0000 mg | DELAYED_RELEASE_TABLET | Freq: Every day | ORAL | Status: DC
  Administered 2014-02-15 – 2014-02-17 (×3): 40 mg via ORAL
  Filled 2014-02-15 (×3): qty 1

## 2014-02-15 MED ORDER — METHOCARBAMOL 500 MG PO TABS: 500.0000 mg | ORAL_TABLET | Freq: Two times a day (BID) | ORAL | Status: DC | PRN

## 2014-02-15 MED ORDER — VANCOMYCIN HCL IN DEXTROSE 1-5 GM/200ML-% IV SOLN
1000.0000 mg | Freq: Once | INTRAVENOUS | Status: AC
  Administered 2014-02-15: 1000 mg via INTRAVENOUS
  Filled 2014-02-15: qty 200

## 2014-02-15 MED ORDER — DEXTROSE 5 % IV SOLN
1.0000 g | Freq: Once | INTRAVENOUS | Status: AC
  Administered 2014-02-15: 1 g via INTRAVENOUS
  Filled 2014-02-15: qty 1

## 2014-02-15 MED ORDER — ALPRAZOLAM 0.25 MG PO TABS: 0.2500 mg | ORAL_TABLET | Freq: Every day | ORAL | Status: DC | PRN

## 2014-02-15 MED ORDER — ACETAMINOPHEN 325 MG PO TABS
650.0000 mg | ORAL_TABLET | Freq: Four times a day (QID) | ORAL | Status: DC | PRN
  Administered 2014-02-15: 650 mg via ORAL
  Filled 2014-02-15: qty 2

## 2014-02-15 MED ORDER — VANCOMYCIN HCL IN DEXTROSE 1-5 GM/200ML-% IV SOLN
1000.0000 mg | Freq: Three times a day (TID) | INTRAVENOUS | Status: DC
  Administered 2014-02-15 – 2014-02-17 (×7): 1000 mg via INTRAVENOUS
  Filled 2014-02-15 (×9): qty 200

## 2014-02-15 MED ORDER — ARIPIPRAZOLE 5 MG PO TABS
5.0000 mg | ORAL_TABLET | Freq: Every day | ORAL | Status: DC
  Administered 2014-02-15 – 2014-02-16 (×2): 5 mg via ORAL
  Filled 2014-02-15 (×4): qty 1

## 2014-02-15 MED ORDER — ARMODAFINIL 150 MG PO TABS
150.0000 mg | ORAL_TABLET | Freq: Every day | ORAL | Status: DC
  Administered 2014-02-16 – 2014-02-17 (×2): 150 mg via ORAL

## 2014-02-15 MED ORDER — ISOSORBIDE MONONITRATE ER 30 MG PO TB24
30.0000 mg | ORAL_TABLET | Freq: Every day | ORAL | Status: DC
  Administered 2014-02-15 – 2014-02-17 (×3): 30 mg via ORAL
  Filled 2014-02-15 (×4): qty 1

## 2014-02-15 MED ORDER — SODIUM CHLORIDE 0.9 % IV SOLN
INTRAVENOUS | Status: DC
  Administered 2014-02-15: 04:00:00 via INTRAVENOUS

## 2014-02-15 MED ORDER — FLUOXETINE HCL 20 MG PO CAPS
40.0000 mg | ORAL_CAPSULE | Freq: Every day | ORAL | Status: DC
  Administered 2014-02-15 – 2014-02-17 (×3): 40 mg via ORAL
  Filled 2014-02-15 (×4): qty 2

## 2014-02-15 MED ORDER — NITROGLYCERIN 0.4 MG SL SUBL: 0.4000 mg | SUBLINGUAL_TABLET | SUBLINGUAL | Status: DC | PRN

## 2014-02-15 MED ORDER — ACETAMINOPHEN 325 MG PO TABS
650.0000 mg | ORAL_TABLET | Freq: Once | ORAL | Status: AC
  Administered 2014-02-15: 650 mg via ORAL
  Filled 2014-02-15: qty 2

## 2014-02-15 MED ORDER — HYDROCOD POLST-CHLORPHEN POLST 10-8 MG/5ML PO LQCR: 5.0000 mL | Freq: Two times a day (BID) | ORAL | Status: DC | PRN

## 2014-02-15 MED ORDER — CLONAZEPAM 1 MG PO TABS: 1.0000 mg | ORAL_TABLET | Freq: Every evening | ORAL | Status: DC | PRN

## 2014-02-15 MED ORDER — GABAPENTIN 100 MG PO CAPS
100.0000 mg | ORAL_CAPSULE | Freq: Every day | ORAL | Status: DC
  Administered 2014-02-15 – 2014-02-17 (×3): 100 mg via ORAL
  Filled 2014-02-15 (×4): qty 1

## 2014-02-15 MED ORDER — LOSARTAN POTASSIUM 25 MG PO TABS
25.0000 mg | ORAL_TABLET | Freq: Every day | ORAL | Status: DC
  Administered 2014-02-15 – 2014-02-17 (×3): 25 mg via ORAL
  Filled 2014-02-15 (×4): qty 1

## 2014-02-15 MED ORDER — ATORVASTATIN CALCIUM 20 MG PO TABS
20.0000 mg | ORAL_TABLET | Freq: Every day | ORAL | Status: DC
  Administered 2014-02-15 – 2014-02-17 (×3): 20 mg via ORAL
  Filled 2014-02-15 (×4): qty 1

## 2014-02-15 MED ORDER — METHYLPREDNISOLONE SODIUM SUCC 40 MG IJ SOLR
40.0000 mg | Freq: Two times a day (BID) | INTRAMUSCULAR | Status: DC
  Administered 2014-02-15 – 2014-02-16 (×3): 40 mg via INTRAVENOUS
  Filled 2014-02-15 (×4): qty 1

## 2014-02-15 MED ORDER — SODIUM CHLORIDE 0.9 % IV BOLUS (SEPSIS): 1000.0000 mL | Freq: Once | INTRAVENOUS | Status: DC

## 2014-02-15 MED ORDER — GUAIFENESIN ER 600 MG PO TB12
600.0000 mg | ORAL_TABLET | Freq: Two times a day (BID) | ORAL | Status: DC
  Administered 2014-02-15 – 2014-02-17 (×5): 600 mg via ORAL
  Filled 2014-02-15 (×6): qty 1

## 2014-02-15 MED ORDER — METHYLPREDNISOLONE SODIUM SUCC 125 MG IJ SOLR: 60.0000 mg | Freq: Two times a day (BID) | INTRAMUSCULAR | Status: DC

## 2014-02-15 MED ORDER — SODIUM CHLORIDE 0.9 % IV BOLUS (SEPSIS)
1000.0000 mL | Freq: Once | INTRAVENOUS | Status: AC
  Administered 2014-02-15: 1000 mL via INTRAVENOUS

## 2014-02-15 MED ORDER — IPRATROPIUM-ALBUTEROL 0.5-2.5 (3) MG/3ML IN SOLN
3.0000 mL | Freq: Three times a day (TID) | RESPIRATORY_TRACT | Status: DC
  Administered 2014-02-15 – 2014-02-17 (×7): 3 mL via RESPIRATORY_TRACT
  Filled 2014-02-15 (×8): qty 3

## 2014-02-15 MED ORDER — MORPHINE SULFATE ER 30 MG PO TBCR
60.0000 mg | EXTENDED_RELEASE_TABLET | Freq: Two times a day (BID) | ORAL | Status: DC
  Administered 2014-02-15 – 2014-02-17 (×5): 60 mg via ORAL
  Filled 2014-02-15 (×5): qty 2

## 2014-02-15 MED ORDER — DEXTROSE 5 % IV SOLN
1.0000 g | Freq: Three times a day (TID) | INTRAVENOUS | Status: DC
  Administered 2014-02-15 – 2014-02-17 (×7): 1 g via INTRAVENOUS
  Filled 2014-02-15 (×10): qty 1

## 2014-02-15 MED ORDER — MIRTAZAPINE 7.5 MG PO TABS
22.5000 mg | ORAL_TABLET | Freq: Every day | ORAL | Status: DC
  Administered 2014-02-15 – 2014-02-16 (×2): 22.5 mg via ORAL
  Filled 2014-02-15 (×4): qty 1

## 2014-02-15 MED ORDER — CARVEDILOL 25 MG PO TABS
25.0000 mg | ORAL_TABLET | Freq: Two times a day (BID) | ORAL | Status: DC
  Administered 2014-02-15 – 2014-02-16 (×3): 25 mg via ORAL
  Filled 2014-02-15 (×7): qty 1

## 2014-02-15 MED ORDER — OXYCODONE-ACETAMINOPHEN 10-325 MG PO TABS: 1.0000 | ORAL_TABLET | Freq: Four times a day (QID) | ORAL | Status: DC | PRN

## 2014-02-15 MED ORDER — SODIUM CHLORIDE 0.9 % IV SOLN
INTRAVENOUS | Status: DC
  Administered 2014-02-15 – 2014-02-16 (×3): via INTRAVENOUS

## 2014-02-15 MED ORDER — CEFTAZIDIME 1 G IJ SOLR: 1.0000 g | Freq: Once | INTRAMUSCULAR | Status: DC

## 2014-02-15 MED ORDER — MOMETASONE FURO-FORMOTEROL FUM 100-5 MCG/ACT IN AERO
2.0000 | INHALATION_SPRAY | Freq: Two times a day (BID) | RESPIRATORY_TRACT | Status: DC
  Administered 2014-02-15 – 2014-02-17 (×4): 2 via RESPIRATORY_TRACT
  Filled 2014-02-15 (×2): qty 8.8

## 2014-02-15 NOTE — Progress Notes (Signed)
ANTIBIOTIC CONSULT NOTE - INITIAL  Pharmacy Consult for Vancomycin Indication: pneumonia  No Known Allergies  Patient Measurements: Height: 5\' 7"  (170.2 cm) Weight: 183 lb (83.008 kg) IBW/kg (Calculated) : 66.1 Adjusted Body Weight:   Vital Signs: Temp: 98.5 F (36.9 C) (03/24 0259) Temp src: Oral (03/24 0259) BP: 116/44 mmHg (03/24 0245) Pulse Rate: 91 (03/24 0245) Intake/Output from previous day:   Intake/Output from this shift:    Labs:  Recent Labs  02/15/14 0100 02/15/14 0200  WBC 10.9*  --   HGB 12.5* 12.9*  PLT 94*  --   CREATININE 0.81 0.90   Estimated Creatinine Clearance: 91.1 ml/min (by C-G formula based on Cr of 0.9). No results found for this basename: VANCOTROUGH, VANCOPEAK, VANCORANDOM, GENTTROUGH, GENTPEAK, GENTRANDOM, TOBRATROUGH, TOBRAPEAK, TOBRARND, AMIKACINPEAK, AMIKACINTROU, AMIKACIN,  in the last 72 hours   Microbiology: No results found for this or any previous visit (from the past 720 hour(s)).  Medical History: Past Medical History  Diagnosis Date  . CAD (coronary artery disease)     Left Main 30% stenosis, LAD 20 - 30 % stenosis, first and second diagonal branchesat 40 - 50%  stenosis with small arteries, circumflex had 30% stenosis in the large obtuse marginal, RCA at 70 - 80%  stenosis [not felt to be occlusive after evaluation with flow wire], distal 50 - 60% stenosis - [name][  . COPD (chronic obstructive pulmonary disease)     Dr. Baird Lyons  . Depression   . Anxiety   . Hyperlipidemia   . Chronic insomnia   . Gout   . GERD (gastroesophageal reflux disease)   . PVD (peripheral vascular disease)     (ABI 0.9 on right and 0.89 on left)  severe left external iliac stenosis.  He  had successful stenting of his left external iliac per Dr. Burt Knack.  . DDD (degenerative disc disease)   . Hx of colonoscopy   . 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 Cancer     lung  . OSA (obstructive sleep apnea)     NPSG 09/10/10- AHI 11.3/hr   . Hypertension     dr Percival Spanish  . Lung cancer 10/04/13    LUL squamous cell lung cancer  . History of radiation therapy 11/10/13- 12/29/13    left lung 6600 cGy in 33 sessions    Medications:  Anti-infectives   Start     Dose/Rate Route Frequency Ordered Stop   02/15/14 1400  vancomycin (VANCOCIN) IVPB 1000 mg/200 mL premix     1,000 mg 200 mL/hr over 60 Minutes Intravenous Every 8 hours 02/15/14 0531     02/15/14 0600  ceFEPIme (MAXIPIME) 1 g in dextrose 5 % 50 mL IVPB     1 g 100 mL/hr over 30 Minutes Intravenous 3 times per day 02/15/14 0402 02/23/14 0559   02/15/14 0415  vancomycin (VANCOCIN) IVPB 1000 mg/200 mL premix     1,000 mg 200 mL/hr over 60 Minutes Intravenous  Once 02/15/14 0406 02/15/14 0515   02/15/14 0145  cefTAZidime (FORTAZ) injection 1 g  Status:  Discontinued     1 g Intramuscular  Once 02/15/14 0135 02/15/14 0141   02/15/14 0145  cefTAZidime (FORTAZ) 1 g in dextrose 5 % 50 mL IVPB     1 g 100 mL/hr over 30 Minutes Intravenous  Once 02/15/14 0142 02/15/14 0240     Assessment: Patient with PNA.  First dose of antibiotics already given.   Goal of Therapy:  Vancomycin trough level 15-20 mcg/ml  Plan:  Measure antibiotic drug levels at steady state Follow up culture results Vancomycin 1gm iv q8hr  Tyler Deis, Shea Stakes [place] 02/15/2014,5:32 AM

## 2014-02-15 NOTE — H&P (Signed)
[facility] History and Physical  Patient: Ernest [name]  KGM:[id number]  DOB: January 24, 1956  DOS: the patient was seen and examined on 02/15/2014 PCP: Drema Pry, DO  Chief Complaint: Shortness of breath and cough  HPI: Ernest [name] is a 58 y.o. male with Past medical history of COPD, coronary artery disease, GERD, peripheral vascular disease status post angioplasty, gout, squamous cell carcinoma on ongoing chemotherapy. The patient is coming from home. The patient presented with complaints of cough and shortness of breath. He mentions his cough is productive with whitish thick sputum which is different than his regular cough which is nonproductive. He also has shortness of breath which is primarily on exertion about also addressed. He complains of some tightness in his chest specifically when he has a cough and he tries to take a deep breath. He denies any paretic nature of pain. He denies any swelling of his legs orthopnea or PND. He denies any nausea or vomiting. There is no oral ulcer. He denies any abdominal pain diarrhea or constipation. He denies any burning urination. He denies any sick contact or recent travel. He mentions he is compliant with his medication.  Review of Systems: as mentioned in the history of present illness.  A Comprehensive review of the other systems is negative.  Past Medical History  Diagnosis Date  . CAD (coronary artery disease)     Left Main 30% stenosis, LAD 20 - 30 % stenosis, first and second diagonal branchesat 40 - 50%  stenosis with small arteries, circumflex had 30% stenosis in the large obtuse marginal, RCA at 70 - 80%  stenosis [not felt to be occlusive after evaluation with flow wire], distal 50 - 60% stenosis - [name][  . COPD (chronic obstructive pulmonary disease)     Dr. Baird Lyons  . Depression   . Anxiety   . Hyperlipidemia   . Chronic insomnia   . Gout   . GERD (gastroesophageal reflux disease)   . PVD  (peripheral vascular disease)     (ABI 0.9 on right and 0.89 on left)  severe left external iliac stenosis.  He  had successful stenting of his left external iliac per Dr. Burt Knack.  . DDD (degenerative disc disease)   . Hx of colonoscopy   . 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 Cancer     lung  . OSA (obstructive sleep apnea)     NPSG 09/10/10- AHI 11.3/hr  . Hypertension     dr Percival Spanish  . Lung cancer 10/04/13    LUL squamous cell lung cancer  . History of radiation therapy 11/10/13- 12/29/13    left lung 6600 cGy in 33 sessions   Past Surgical History  Procedure Laterality Date  . Spinal fusion  03/05/2007    L4-L5  . Hip surgery      left  . Arm surgery      left  . Shoulder surgery      right  . C-spine surgery    . Angioplasty    . Video bronchoscopy with endobronchial navigation N/A 10/04/2013    Procedure: VIDEO BRONCHOSCOPY WITH ENDOBRONCHIAL NAVIGATION;  Surgeon: Collene Gobble, MD;  Location: [facility];  Service: Thoracic;  Laterality: N/A;  . Back surgery     Social History:  reports that he quit smoking about 4 months ago. His smoking use included Cigarettes. He has a 21.5 pack-year smoking history. He has never used smokeless tobacco. He reports that he does not drink alcohol or use  illicit drugs. Independent for most of his  ADL.  No Known Allergies  Family History  Problem Relation Age of Onset  . Heart attack Mother   . Heart attack Sister   . Lung cancer Sister   . Cancer Sister     small cell lung, mets to brain  . Emphysema Sister   . Hypertension Brother     Prior to Admission medications   Medication Sig Start Date End Date Taking? Authorizing Provider  albuterol (PROVENTIL HFA;VENTOLIN HFA) 108 (90 BASE) MCG/ACT inhaler Inhale 2 puffs into the lungs every 4 (four) hours as needed for wheezing or shortness of breath.   Yes Historical Provider, MD  albuterol (PROVENTIL) (2.5 MG/3ML) 0.083% nebulizer solution Take 2.5 mg by nebulization every 4 (four) hours as  needed for wheezing or shortness of breath.   Yes Historical Provider, MD  ALPRAZolam (XANAX) 0.25 MG tablet Take 1 tablet (0.25 mg total) by mouth daily as needed for anxiety. 12/20/13  Yes Doe-Hyun R Shawna Orleans, DO  amLODipine (NORVASC) 10 MG tablet TAKE 1 TABLET (10 MG TOTAL) BY MOUTH DAILY. 11/14/13  Yes Minus Breeding, MD  ARIPiprazole (ABILIFY) 5 MG tablet Take 1 tablet (5 mg total) by mouth at bedtime. 03/30/13  Yes Doe-Hyun R Shawna Orleans, DO  Armodafinil (NUVIGIL) 150 MG tablet Take 150 mg by mouth daily.   Yes Historical Provider, MD  atorvastatin (LIPITOR) 20 MG tablet Take 1 tablet (20 mg total) by mouth daily. 07/22/13  Yes Doe-Hyun R Shawna Orleans, DO  carvedilol (COREG) 25 MG tablet Take 1 tablet (25 mg total) by mouth 2 (two) times daily with a meal. 07/22/13  Yes Doe-Hyun R Shawna Orleans, DO  clonazePAM (KLONOPIN) 1 MG tablet Take 1 mg by mouth at bedtime as needed. For anxiety/insomnia   Yes Historical Provider, MD  esomeprazole (NEXIUM) 40 MG capsule Take 1 capsule (40 mg total) by mouth daily before breakfast. 03/10/13  Yes Doe-Hyun R Shawna Orleans, DO  FLUoxetine (PROZAC) 40 MG capsule Take 40 mg by mouth daily.     Yes Historical Provider, MD  Fluticasone-Salmeterol (ADVAIR DISKUS) 250-50 MCG/DOSE AEPB Inhale 1 puff into the lungs 2 (two) times daily. 06/17/12  Yes Doe-Hyun R Shawna Orleans, DO  gabapentin (NEURONTIN) 100 MG capsule Take 100 mg by mouth daily.  05/25/13  Yes Historical Provider, MD  Indacaterol Maleate (ARCAPTA NEOHALER) 75 MCG CAPS Place 1 capsule into inhaler and inhale daily.   Yes Historical Provider, MD  isosorbide mononitrate (IMDUR) 30 MG 24 hr tablet Take 1 tablet (30 mg total) by mouth daily. 11/09/13  Yes Burtis Junes, NP  losartan (COZAAR) 25 MG tablet Take 1 tablet (25 mg total) by mouth daily. 07/22/13  Yes Doe-Hyun R Shawna Orleans, DO  methocarbamol (ROBAXIN) 500 MG tablet Take 500 mg by mouth 2 (two) times daily as needed for muscle spasms.  10/27/13  Yes Historical Provider, MD  mirtazapine (REMERON) 15 MG tablet Take  22.5 mg by mouth at bedtime.   Yes Historical Provider, MD  morphine (MS CONTIN) 60 MG 12 hr tablet Take 60 mg by mouth 2 (two) times daily.     Yes Historical Provider, MD  Omega-3 Fatty Acids (FISH OIL CONCENTRATE) 1000 MG CAPS Take 1,000 mg by mouth daily.    Yes Historical Provider, MD  oxyCODONE-acetaminophen (PERCOCET) 10-325 MG per tablet Take 1 tablet by mouth every 6 (six) hours as needed for pain.  01/12/14  Yes Historical Provider, MD  potassium chloride SA (K-DUR,KLOR-CON) 20 MEQ tablet Take 20 mEq by  mouth daily.   Yes Historical Provider, MD  nitroGLYCERIN (NITROSTAT) 0.4 MG SL tablet Place 1 tablet (0.4 mg total) under the tongue every 5 (five) minutes as needed for chest pain. 11/09/13   Burtis Junes, NP  VOLTAREN 1 % GEL Apply 2 g topically daily as needed (for pain).  06/24/12   Historical Provider, MD    Physical Exam: Filed Vitals:   02/15/14 0217 02/15/14 0230 02/15/14 0245 02/15/14 0259  BP:  119/58 116/44   Pulse:  91 91   Temp:    98.5 F (36.9 C)  TempSrc:    Oral  Resp:      Height:      Weight:      SpO2: 94% 92% 93%     General: Alert, Awake and Oriented to Time, Place and Person. Appear in moderate distress Eyes: PERRL ENT: Oral Mucosa clear moist. Neck: No JVD Cardiovascular: S1 and S2 Present, no Murmur, Peripheral Pulses Present Respiratory: Bilateral Air entry equal and Decreased, faint Crackles, extensive expiratory wheezes Abdomen: Bowel Sound Present, Soft and Non tender Skin: No Rash Extremities: No Pedal edema, no calf tenderness Neurologic: Grossly Unremarkable.  Labs on Admission:  CBC:  Recent Labs Lab 02/08/14 0901 02/15/14 0100 02/15/14 0200  WBC 6.3 10.9*  --   NEUTROABS 4.4 7.1  --   HGB 13.6 12.5* 12.9*  HCT 41.1 37.7* 38.0*  MCV 96.5 95.9  --   PLT 138* 94*  --     CMP     Component Value Date/Time   NA 133* 02/15/2014 0200   NA 143 02/08/2014 0902   K 3.7 02/15/2014 0200   K 3.5 02/08/2014 0902   CL 97 02/15/2014  0200   CO2 24 02/15/2014 0100   CO2 25 02/08/2014 0902   GLUCOSE 132* 02/15/2014 0200   GLUCOSE 112 02/08/2014 0902   BUN 9 02/15/2014 0200   BUN 10.3 02/08/2014 0902   CREATININE 0.90 02/15/2014 0200   CREATININE 0.8 02/08/2014 0902   CALCIUM 9.1 02/15/2014 0100   CALCIUM 9.7 02/08/2014 0902   PROT 6.3 02/15/2014 0100   PROT 6.8 02/08/2014 0902   ALBUMIN 3.6 02/15/2014 0100   ALBUMIN 3.8 02/08/2014 0902   AST 19 02/15/2014 0100   AST 16 02/08/2014 0902   ALT 20 02/15/2014 0100   ALT 18 02/08/2014 0902   ALKPHOS 144* 02/15/2014 0100   ALKPHOS 134 02/08/2014 0902   BILITOT 0.6 02/15/2014 0100   BILITOT 0.33 02/08/2014 0902   GFRNONAA >90 02/15/2014 0100   GFRAA >90 02/15/2014 0100    No results found for this basename: LIPASE, AMYLASE,  in the last 168 hours No results found for this basename: AMMONIA,  in the last 168 hours   Recent Labs Lab 02/15/14 0410  TROPONINI <0.30   BNP (last 3 results)  Recent Labs  10/03/13 2125  PROBNP 64.7    Radiological Exams on Admission: Dg Chest 2 View  02/15/2014   CLINICAL DATA:  Fever, cough and shortness of breath.  EXAM: CHEST  2 VIEW  COMPARISON:  Chest x-ray 10/04/2013.  FINDINGS: No acute consolidative airspace disease. Small left pleural effusion. Cavitary area in the medial aspect of the left upper lobe appears similar to recent chest CT 01/28/2014. There soft tissue fullness in the medial aspect of the left upper lobe adjacent to this area of cavitation, compatible with residual nodularity as demonstrated on recent chest CT. Right lung appears clear. No evidence of pulmonary edema. Heart size is  normal. Orthopedic fixation hardware in the lower cervical spine.  IMPRESSION: 1. Chronic changes in the medial aspect of the left upper lobe related to treated lung cancer, similar to recent prior chest CT, as detailed above. 2. Small left pleural effusion is unchanged.   Electronically Signed   By: Vinnie Langton M.D.   On: 02/15/2014 02:17    EKG:  Independently reviewed. normal EKG, normal sinus rhythm, nonspecific ST and T waves changes.  Assessment/Plan Principal Problem:   COPD with exacerbation Active Problems:   HYPERTENSION   CORONARY ATHEROSCLEROSIS NATIVE CORONARY ARTERY   UNSPECIFIED PERIPHERAL VASCULAR DISEASE   GERD   Squamous cell lung cancer   Sepsis   1. COPD with exacerbation The patient is presenting with numbness of cough with change in his sputum after as well as shortness of breath. Thus she meets criteria for COPD exacerbation. His symptoms had improved significantly with breathing treatment. He would be a due to the hospital and started on IV antibiotics and nebulization. I will check influenza PCR and if negative patient will be placed on steroids. Considering his history of ongoing chemotherapy and the possibility of early pneumonia patient will be treated with broad-spectrum antibiotics which can be narrowed down late later  2. Chest tightness EKG does not show any acute signs of ischemia medium troponin is negative Patient will be monitored on telemetry and I would continue to monitor his serial troponins and limited echocardiogram will be done in the morning as well Most likely secondary to COPD  3. Squamous cell lung cancer Patient has received Neulasta 2 days ago Does this CBC can be difficult to interpret. Continue to monitor.  4. GERD Continue Protonix  DVT Prophylaxis: subcutaneous Heparin Nutrition: Cardiac diet  Code Status: Full  Family Communication: Family was present at bedside, opportunity was given to ask question and all questions were answered satisfactorily at the time of interview. Disposition: Admitted to inpatient in telemetry unit.  Author: Berle Mull, MD [facility] Pager: [phone] 02/15/2014, 5:22 AM    If 7PM-7AM, please contact night-coverage [email or web address] Password [email or web address]

## 2014-02-15 NOTE — Progress Notes (Signed)
  Echocardiogram 2D Echocardiogram has been performed.  Ernest [name] 02/15/2014, 10:38 AM

## 2014-02-15 NOTE — ED Notes (Signed)
Pt states chest tightness is better but still having some after the breathing tx.

## 2014-02-15 NOTE — Progress Notes (Signed)
Recd from ED condition stable. Temp 100. Up slightly since last time in ED.  Pt placed on droplet precaution per protocol/ Wife at bedside. Educated regarding use of the PPE r/t pt being ruled out for Flu.

## 2014-02-15 NOTE — Care Management Note (Signed)
    Page 1 of 1   02/15/2014     11:54:09 AM   CARE MANAGEMENT NOTE 02/15/2014  Patient:  Ernest [name], Ernest [name]   Account Number:  [id number]  Date Initiated:  02/15/2014  Documentation initiated by:  Dessa Phi  Subjective/Objective Assessment:   58 Y/O M ADMITTED W/SOB.TD:VVOH,YWVP CA.     Action/Plan:   FROM HOME.HAS PCP,PHARMACY.   Anticipated DC Date:  02/18/2014   Anticipated DC Plan:  [place]  CM consult      Choice offered to / List presented to:             Status of service:  In process, will continue to follow Medicare Important Message given?   (If response is "NO", the following Medicare IM given date fields will be blank) Date Medicare IM given:   Date Additional Medicare IM given:    Discharge Disposition:    Per UR Regulation:  Reviewed for med. necessity/level of care/duration of stay  If discussed at [place] of Stay Meetings, dates discussed:    Comments:  02/15/14 Salle Brandle RN,BSN NCM [phone] NO ANTICIPATED D/C NEEDS.

## 2014-02-15 NOTE — Progress Notes (Signed)
*  Preliminary Results* Bilateral lower extremity venous duplex completed. Bilateral lower extremities are negative for deep vein thrombosis. There is no evidence of Baker's cyst bilaterally.  02/15/2014  Maudry Mayhew, RVT, RDCS, RDMS

## 2014-02-15 NOTE — ED Notes (Signed)
Pt recently had a chemo treatment on the 17th.

## 2014-02-15 NOTE — ED Provider Notes (Signed)
CSN: [id number]     Arrival date & time 02/14/14  2321 History   First MD Initiated Contact with Patient 02/15/14 0123     Chief Complaint  Patient presents with  . Fever  . 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 Patient is s/p 7 rounds of Carboplatin based therapy which he concluded in late December. He recently started Carboplatin and Taxol regimen with first dose on February 08, 2014.   He comes in tonight with fever since around 10am - Tm 102.62F. Patient endorses SOB and aching pain just to the left of the sternum with coughing but, only with coughing. Pain is mild and nonradiating. Cough is productive of thick while phlegm without blood.   PO intake has been diminished today but, was good prior. No N/V/D. Patient has been using his Albuterol nebs and HFA frequently this evening but without significant improvement in symptoms.  Patient says he noticed that his equilibrium has been off for the past couple of days. Wife says she has seen the patient "kind of side stepping"...."i think more to the right".  NO other focal deficits.   Past Medical History  Diagnosis Date  . CAD (coronary artery disease)     Left Main 30% stenosis, LAD 20 - 30 % stenosis, first and second diagonal branchesat 40 - 50%  stenosis with small arteries, circumflex had 30% stenosis in the large obtuse marginal, RCA at 70 - 80%  stenosis [not felt to be occlusive after evaluation with flow wire], distal 50 - 60% stenosis - [name][  . COPD (chronic obstructive pulmonary disease)     Dr. Baird Lyons  . Depression   . Anxiety   . Hyperlipidemia   . Chronic insomnia   . Gout   . GERD (gastroesophageal reflux disease)   . PVD (peripheral vascular disease)     (ABI 0.9 on right and 0.89 on left)  severe left external iliac stenosis.  He  had successful stenting of his left  external iliac per Dr. Burt Knack.  . DDD (degenerative disc disease)   . Hx of colonoscopy   . 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 Cancer     lung  . OSA (obstructive sleep apnea)     NPSG 09/10/10- AHI 11.3/hr  . Hypertension     dr Percival Spanish  . Lung cancer 10/04/13    LUL squamous cell lung cancer  . History of radiation therapy 11/10/13- 12/29/13    left lung 6600 cGy in 33 sessions   Past Surgical History  Procedure Laterality Date  . Spinal fusion  03/05/2007    L4-L5  . Hip surgery      left  . Arm surgery      left  . Shoulder surgery      right  . C-spine surgery    . Angioplasty    . Video bronchoscopy with endobronchial navigation N/A 10/04/2013    Procedure: VIDEO BRONCHOSCOPY WITH ENDOBRONCHIAL NAVIGATION;  Surgeon: Collene Gobble, MD;  Location: [facility] OR;  Service: Thoracic;  Laterality: N/A;  . Back surgery     Family History  Problem Relation Age of Onset  . Heart attack Mother   . Heart attack Sister   . Lung cancer Sister   . Cancer Sister     small cell lung, mets to brain  . Emphysema Sister   . Hypertension Brother  History  Substance Use Topics  . Smoking status: Former Smoker -- 0.50 packs/day for 43 years    Types: Cigarettes    Quit date: 09/23/2013  . Smokeless tobacco: Never Used     Comment: history of 3 PPD, 11/02/13 not smoking at all  . Alcohol Use: No    Review of Systems Ten point review of symptoms performed and is negative with the exception of symptoms noted above along with general malaise and fatigue.     Allergies  Review of patient's allergies indicates no known allergies.  Home Medications   Current Outpatient Rx  Name  Route  Sig  Dispense  Refill  . albuterol (PROVENTIL HFA;VENTOLIN HFA) 108 (90 BASE) MCG/ACT inhaler   Inhalation   Inhale 2 puffs into the lungs every 4 (four) hours as needed for wheezing or shortness of breath.         [name] albuterol (PROVENTIL) (2.5 MG/3ML) 0.083% nebulizer solution   Nebulization    Take 2.5 mg by nebulization every 4 (four) hours as needed for wheezing or shortness of breath.         . ALPRAZolam (XANAX) 0.25 MG tablet   Oral   Take 1 tablet (0.25 mg total) by mouth daily as needed for anxiety.   30 tablet   2   . amLODipine (NORVASC) 10 MG tablet      TAKE 1 TABLET (10 MG TOTAL) BY MOUTH DAILY.   30 tablet   6   . ARIPiprazole (ABILIFY) 5 MG tablet   Oral   Take 1 tablet (5 mg total) by mouth at bedtime.         . Armodafinil (NUVIGIL) 150 MG tablet   Oral   Take 150 mg by mouth daily.         [name] atorvastatin (LIPITOR) 20 MG tablet   Oral   Take 1 tablet (20 mg total) by mouth daily.   90 tablet   3   . carvedilol (COREG) 25 MG tablet   Oral   Take 1 tablet (25 mg total) by mouth 2 (two) times daily with a meal.   180 tablet   3   . clonazePAM (KLONOPIN) 1 MG tablet   Oral   Take 1 mg by mouth at bedtime as needed. For anxiety/insomnia         . esomeprazole (NEXIUM) 40 MG capsule   Oral   Take 1 capsule (40 mg total) by mouth daily before breakfast.   90 capsule   3   . FLUoxetine (PROZAC) 40 MG capsule   Oral   Take 40 mg by mouth daily.           . Fluticasone-Salmeterol (ADVAIR DISKUS) 250-50 MCG/DOSE AEPB   Inhalation   Inhale 1 puff into the lungs 2 (two) times daily.   1 each   3   . gabapentin (NEURONTIN) 100 MG capsule   Oral   Take 100 mg by mouth daily.          . Indacaterol Maleate (ARCAPTA NEOHALER) 75 MCG CAPS   Inhalation   Place 1 capsule into inhaler and inhale daily.         . isosorbide mononitrate (IMDUR) 30 MG 24 hr tablet   Oral   Take 1 tablet (30 mg total) by mouth daily.   30 tablet   3   . losartan (COZAAR) 25 MG tablet   Oral   Take 1 tablet (25 mg total) by mouth daily.  90 tablet   3   . methocarbamol (ROBAXIN) 500 MG tablet   Oral   Take 500 mg by mouth 2 (two) times daily as needed for muscle spasms.          . mirtazapine (REMERON) 15 MG tablet   Oral   Take 22.5 mg  by mouth at bedtime.         [name] morphine (MS CONTIN) 60 MG 12 hr tablet   Oral   Take 60 mg by mouth 2 (two) times daily.           . Omega-3 Fatty Acids (FISH OIL CONCENTRATE) 1000 MG CAPS   Oral   Take 1,000 mg by mouth daily.          [name] oxyCODONE-acetaminophen (PERCOCET) 10-325 MG per tablet   Oral   Take 1 tablet by mouth every 6 (six) hours as needed for pain.          . potassium chloride SA (K-DUR,KLOR-CON) 20 MEQ tablet   Oral   Take 20 mEq by mouth daily.         . nitroGLYCERIN (NITROSTAT) 0.4 MG SL tablet   Sublingual   Place 1 tablet (0.4 mg total) under the tongue every 5 (five) minutes as needed for chest pain.   25 tablet   3   . VOLTAREN 1 % GEL   Topical   Apply 2 g topically daily as needed (for pain).           BP 132/63  Pulse 101  Temp(Src) 103 F (39.4 C) (Oral)  Resp 32  Ht 5\' 7"  (1.702 m)  Wt 183 lb (83.008 kg)  BMI 28.66 kg/m2  SpO2 94% Physical Exam Gen: well developed and well nourished appearing, ill appearing Head: NCAT Eyes: PERL, EOMI Nose: no epistaixis or rhinorrhea Mouth/throat: mucosa is moist and mildly dehydrated appearing Neck: supple, no stridor, trachea midline, no jvd Lungs:RR 32/min, diffuse wheezing with good air exchange bilaterally, no rhonchi or rales. t CV: Rapid and regular, no murmur, extremities appear well perfused.  Abd: obese, soft, notender, nondistended Back: no ttp, no cva ttp Skin: warm and dry Ext: normal to inspection, no dependent edema Neuro: CN ii-xii grossly intact, no focal deficits Psyche; normal affect,  calm and cooperative.   ED Course  Procedures (including critical care time) Labs Review  Results for orders placed during the hospital encounter of 02/14/14 (from the past 24 hour(s))  CBC WITH DIFFERENTIAL     Status: Abnormal   Collection Time    02/15/14  1:00 AM      Result Value Ref Range   WBC 10.9 (*) 4.0 - 10.5 K/uL   RBC 3.93 (*) 4.22 - 5.81 MIL/uL   Hemoglobin 12.5  (*) 13.0 - 17.0 g/dL   HCT 37.7 (*) 39.0 - 52.0 %   MCV 95.9  78.0 - 100.0 fL   MCH 31.8  26.0 - 34.0 pg   MCHC 33.2  30.0 - 36.0 g/dL   RDW 15.4  11.5 - 15.5 %   Platelets 94 (*) 150 - 400 K/uL   Neutrophils Relative % 65  43 - 77 %   Lymphocytes Relative 8 (*) 12 - 46 %   Monocytes Relative 25 (*) 3 - 12 %   Eosinophils Relative 1  0 - 5 %   Basophils Relative 1  0 - 1 %   Neutro Abs 7.1  1.7 - 7.7 K/uL   Lymphs Abs 0.9  0.7 - 4.0 K/uL   Monocytes Absolute 2.7 (*) 0.1 - 1.0 K/uL   Eosinophils Absolute 0.1  0.0 - 0.7 K/uL   Basophils Absolute 0.1  0.0 - 0.1 K/uL   WBC Morphology       Value: MODERATE LEFT SHIFT (>5% METAS AND MYELOS,OCC PRO NOTED)   Smear Review GIANT PLATELETS SEEN    COMPREHENSIVE METABOLIC PANEL     Status: Abnormal   Collection Time    02/15/14  1:00 AM      Result Value Ref Range   Sodium 133 (*) 137 - 147 mEq/L   Potassium 3.9  3.7 - 5.3 mEq/L   Chloride 93 (*) 96 - 112 mEq/L   CO2 24  19 - 32 mEq/L   Glucose, Bld 134 (*) 70 - 99 mg/dL   BUN 10  6 - 23 mg/dL   Creatinine, Ser 0.81  0.50 - 1.35 mg/dL   Calcium 9.1  8.4 - 10.5 mg/dL   Total Protein 6.3  6.0 - 8.3 g/dL   Albumin 3.6  3.5 - 5.2 g/dL   AST 19  0 - 37 U/L   ALT 20  0 - 53 U/L   Alkaline Phosphatase 144 (*) 39 - 117 U/L   Total Bilirubin 0.6  0.3 - 1.2 mg/dL   GFR calc non Af Amer >90  >90 mL/min   GFR calc Af Amer >90  >90 mL/min  LACTIC ACID, PLASMA     Status: None   Collection Time    02/15/14  1:47 AM      Result Value Ref Range   Lactic Acid, Venous 1.4  0.5 - 2.2 mmol/L  I-STAT TROPOININ, ED     Status: None   Collection Time    02/15/14  1:57 AM      Result Value Ref Range   Troponin i, poc 0.04  0.00 - 0.08 ng/mL   Comment 3           URINALYSIS, ROUTINE W REFLEX MICROSCOPIC     Status: None   Collection Time    02/15/14  1:59 AM      Result Value Ref Range   Color, Urine YELLOW  YELLOW   APPearance CLEAR  CLEAR   Specific Gravity, Urine 1.020  1.005 - 1.030   pH 8.0   5.0 - 8.0   Glucose, UA NEGATIVE  NEGATIVE mg/dL   Hgb urine dipstick NEGATIVE  NEGATIVE   Bilirubin Urine NEGATIVE  NEGATIVE   Ketones, ur NEGATIVE  NEGATIVE mg/dL   Protein, ur NEGATIVE  NEGATIVE mg/dL   Urobilinogen, UA 1.0  0.0 - 1.0 mg/dL   Nitrite NEGATIVE  NEGATIVE   Leukocytes, UA NEGATIVE  NEGATIVE  I-STAT CHEM 8, ED     Status: Abnormal   Collection Time    02/15/14  2:00 AM      Result Value Ref Range   Sodium 133 (*) 137 - 147 mEq/L   Potassium 3.7  3.7 - 5.3 mEq/L   Chloride 97  96 - 112 mEq/L   BUN 9  6 - 23 mg/dL   Creatinine, Ser 0.90  0.50 - 1.35 mg/dL   Glucose, Bld 132 (*) 70 - 99 mg/dL   Calcium, Ion 1.12  1.12 - 1.23 mmol/L   TCO2 25  0 - 100 mmol/L   Hemoglobin 12.9 (*) 13.0 - 17.0 g/dL   HCT 38.0 (*) 39.0 - 52.0 %   DG Chest 2 View (Final result)  Result  time: 02/15/14 02:17:11    Final result by Rad Results In Interface (02/15/14 02:17:11)    Narrative:   CLINICAL DATA: Fever, cough and shortness of breath.  EXAM: CHEST 2 VIEW  COMPARISON: Chest x-ray 10/04/2013.  FINDINGS: No acute consolidative airspace disease. Small left pleural effusion. Cavitary area in the medial aspect of the left upper lobe appears similar to recent chest CT 01/28/2014. There soft tissue fullness in the medial aspect of the left upper lobe adjacent to this area of cavitation, compatible with residual nodularity as demonstrated on recent chest CT. Right lung appears clear. No evidence of pulmonary edema. Heart size is normal. Orthopedic fixation hardware in the lower cervical spine.  IMPRESSION: 1. Chronic changes in the medial aspect of the left upper lobe related to treated lung cancer, similar to recent prior chest CT, as detailed above. 2. Small left pleural effusion is unchanged.   Electronically Signed By: Vinnie Langton M.D. On: 02/15/2014 02:17    EKG: sinus tach, PVCs, RAD, normal intervals, no acute ischemic changes.  CRITICAL CARE Performed by:  Elyn Peers   Total critical care time: 42m  Critical care time was exclusive of separately billable procedures and treating other patients.  Critical care was necessary to treat or prevent imminent or life-threatening deterioration.  Critical care was time spent personally by me on the following activities: development of treatment plan with patient and/or surrogate as well as nursing, discussions with consultants, evaluation of patient's response to treatment, examination of patient, obtaining history from patient or surrogate, ordering and performing treatments and interventions, ordering and review of laboratory studies, ordering and review of radiographic studies, pulse oximetry and re-evaluation of patient's condition.    MDM   Patient with acute respiratory distress and sepsis likely secondary to early pneumonia. Also noted to have diffuse wheezing with known COPD. Patient treated with IVF, empiric abx and 5mg  Albuterol SVN. REchecked at [place]. I checked temp and it is 42F. Pulse now in 88 range and BP wnl. Sats 93% on RA. Patient says he is feeling better. He looks better. I believe that he is stable for an observation admission to the Med Surg unit. Case discussed with Dr. Posey Pronto who has accepted. Will add Azithromycin for atypical coverage. Cultures drawn and pending.     Elyn Peers, MD 02/15/14 [phone]

## 2014-02-15 NOTE — Progress Notes (Addendum)
PATIENT DETAILS Name: Ernest [name] Age: 58 y.o. Sex: male Date of Birth: 1956/04/15 Admit Date: 02/14/2014 Admitting Physician Berle Mull, MD DUK:GURKYH Shawna Orleans, DO  Subjective: Claims shortness of breath better than on admission. Patient admitted with several days of Fever, cough and then worsening SOB.   Assessment/Plan: Principal Problem:   COPD with exacerbation -Admitted and started on IV solumedrol, Nebs, antibiotics. Clinically improved today-compared to on admission.  Still with prolonged expiration and wheezing on exam. Continue with current care.  Active Problems: SIR's -suspect PNA-not seen on CXR. -await cultures -c/w Vanco/Cefepime day 1  Suspected HCAP -non toxic looking,103 F fever on admission, low grade fever since then. Although CXR neg-clinical symptoms consistent with PNA. Patient immunocompromised, with hx of Sq cell Lung ca and getting chemo as outpatient. -await Blood cultures -c/w Vanco/Cefepime day 1  Chest tightness -resolved -likely secondary to bronchospasm -troponin neg, ECHO neg for wall motion abnormality, check Doppler of the lower extremity -known hx of CAD-non obstructive-Nuclear Stress Test on 11/16/13 neg  Thrombocytopenia -?secondary to recent Chemo -monitor for now  HTN -controlled with Coreg, Imdur and Losartan  GERD  -Continue Protonix  Squamous cell lung cancer  -recently completed RTx, getting ChemoTx-will notify patient Oncologist-Dr Julien Nordmann of patient's admission, awaiting call back  Anxiety/Depression -c/w Prozac, Remeron,Abilify and as needed Klonopin  Chronic Pain synd -c/w Narcotics  Disposition: Remain inpatient  DVT Prophylaxis: SCD's  Code Status: Full code   Family Communication Spouse at bedside  Procedures:  None  CONSULTS:  None  Time spent 40 minutes-which includes 50% of the time with face-to-face with patient/ family and coordinating care related to the above assessment and  plan.    MEDICATIONS: Scheduled Meds: . ARIPiprazole  5 mg Oral QHS  . Armodafinil  150 mg Oral Daily  . atorvastatin  20 mg Oral q1800  . carvedilol  25 mg Oral BID WC  . ceFEPime (MAXIPIME) IV  1 g Intravenous 3 times per day  . FLUoxetine  40 mg Oral Daily  . gabapentin  100 mg Oral Daily  . guaiFENesin  600 mg Oral BID  . ipratropium-albuterol  3 mL Nebulization TID  . isosorbide mononitrate  30 mg Oral Daily  . losartan  25 mg Oral Daily  . methylPREDNISolone (SOLU-MEDROL) injection  40 mg Intravenous Q12H  . mirtazapine  22.5 mg Oral QHS  . mometasone-formoterol  2 puff Inhalation BID  . morphine  60 mg Oral BID  . pantoprazole  40 mg Oral Daily  . vancomycin  1,000 mg Intravenous Q8H   Continuous Infusions: . sodium chloride 75 mL/hr at 02/15/14 0415   PRN Meds:.acetaminophen, albuterol, ALPRAZolam, chlorpheniramine-HYDROcodone, clonazePAM, methocarbamol, nitroGLYCERIN, oxyCODONE, oxyCODONE-acetaminophen  Antibiotics: Anti-infectives   Start     Dose/Rate Route Frequency Ordered Stop   02/15/14 1400  vancomycin (VANCOCIN) IVPB 1000 mg/200 mL premix     1,000 mg 200 mL/hr over 60 Minutes Intravenous Every 8 hours 02/15/14 0531     02/15/14 0600  ceFEPIme (MAXIPIME) 1 g in dextrose 5 % 50 mL IVPB     1 g 100 mL/hr over 30 Minutes Intravenous 3 times per day 02/15/14 0402 02/23/14 0559   02/15/14 0415  vancomycin (VANCOCIN) IVPB 1000 mg/200 mL premix     1,000 mg 200 mL/hr over 60 Minutes Intravenous  Once 02/15/14 0406 02/15/14 0515   02/15/14 0145  cefTAZidime (FORTAZ) injection 1 g  Status:  Discontinued     1 g  Intramuscular  Once 02/15/14 0135 02/15/14 0141   02/15/14 0145  cefTAZidime (FORTAZ) 1 g in dextrose 5 % 50 mL IVPB     1 g 100 mL/hr over 30 Minutes Intravenous  Once 02/15/14 0142 02/15/14 0240       PHYSICAL EXAM: Vital signs in last 24 hours: Filed Vitals:   02/15/14 0544 02/15/14 0811 02/15/14 0900 02/15/14 1100  BP: 133/63     Pulse: 91       Temp: 100 F (37.8 C)  100.1 F (37.8 C) 98.7 F (37.1 C)  TempSrc: Oral  Oral Oral  Resp: 28     Height: 5\' 7"  (1.702 m)     Weight: 84.233 kg (185 lb 11.2 oz)     SpO2: 98% 97%      Weight change:  Filed Weights   02/14/14 2337 02/15/14 0544  Weight: 83.008 kg (183 lb) 84.233 kg (185 lb 11.2 oz)   Body mass index is 29.08 kg/(m^2).   Gen Exam: Awake and alert with clear speech.   Neck: Supple, No JVD.   Chest:Good air entry b/l-rhonchi all over  CVS: S1 S2 Regular, no murmurs.  Abdomen: soft, BS +, non tender, non distended.  Extremities: no edema, lower extremities warm to touch. Neurologic: Non Focal.   Skin: No Rash.   Wounds: N/A.    Intake/Output from previous day:  Intake/Output Summary (Last 24 hours) at 02/15/14 1310 Last data filed at 02/15/14 1006  Gross per 24 hour  Intake    120 ml  Output      0 ml  Net    120 ml     LAB RESULTS: CBC  Recent Labs Lab 02/15/14 0100 02/15/14 0200 02/15/14 0510  WBC 10.9*  --  9.6  HGB 12.5* 12.9* 11.8*  HCT 37.7* 38.0* 34.9*  PLT 94*  --  92*  MCV 95.9  --  94.8  MCH 31.8  --  32.1  MCHC 33.2  --  33.8  RDW 15.4  --  15.5  LYMPHSABS 0.9  --  1.8  MONOABS 2.7*  --  1.2*  EOSABS 0.1  --  0.1  BASOSABS 0.1  --  0.0    Chemistries   Recent Labs Lab 02/15/14 0100 02/15/14 0200 02/15/14 0510  NA 133* 133* 132*  K 3.9 3.7 3.8  CL 93* 97 94*  CO2 24  --  23  GLUCOSE 134* 132* 159*  BUN 10 9 10   CREATININE 0.81 0.90 0.83  CALCIUM 9.1  --  8.8    CBG: No results found for this basename: GLUCAP,  in the last 168 hours  GFR Estimated Creatinine Clearance: 99.4 ml/min (by C-G formula based on Cr of 0.83).  Coagulation profile No results found for this basename: INR, PROTIME,  in the last 168 hours  Cardiac Enzymes  Recent Labs Lab 02/15/14 0410 02/15/14 0944  TROPONINI <0.30 <0.30    No components found with this basename: POCBNP,  No results found for this basename: DDIMER,  in the  last 72 hours No results found for this basename: HGBA1C,  in the last 72 hours No results found for this basename: CHOL, HDL, LDLCALC, TRIG, CHOLHDL, LDLDIRECT,  in the last 72 hours No results found for this basename: TSH, T4TOTAL, FREET3, T3FREE, THYROIDAB,  in the last 72 hours No results found for this basename: VITAMINB12, FOLATE, FERRITIN, TIBC, IRON, RETICCTPCT,  in the last 72 hours No results found for this basename: LIPASE, AMYLASE,  in  the last 72 hours  Urine Studies No results found for this basename: UACOL, UAPR, USPG, UPH, UTP, UGL, UKET, UBIL, UHGB, UNIT, UROB, ULEU, UEPI, UWBC, URBC, UBAC, CAST, CRYS, UCOM, BILUA,  in the last 72 hours  MICROBIOLOGY: No results found for this or any previous visit (from the past 240 hour(s)).  RADIOLOGY STUDIES/RESULTS: Dg Chest 2 View  02/15/2014   CLINICAL DATA:  Fever, cough and shortness of breath.  EXAM: CHEST  2 VIEW  COMPARISON:  Chest x-ray 10/04/2013.  FINDINGS: No acute consolidative airspace disease. Small left pleural effusion. Cavitary area in the medial aspect of the left upper lobe appears similar to recent chest CT 01/28/2014. There soft tissue fullness in the medial aspect of the left upper lobe adjacent to this area of cavitation, compatible with residual nodularity as demonstrated on recent chest CT. Right lung appears clear. No evidence of pulmonary edema. Heart size is normal. Orthopedic fixation hardware in the lower cervical spine.  IMPRESSION: 1. Chronic changes in the medial aspect of the left upper lobe related to treated lung cancer, similar to recent prior chest CT, as detailed above. 2. Small left pleural effusion is unchanged.   Electronically Signed   By: Vinnie Langton M.D.   On: 02/15/2014 02:17   Ct Chest W Contrast  01/28/2014   CLINICAL DATA:  Lung cancer diagnosed in December 2014. Chemotherapy and radiation therapy completed.  EXAM: CT CHEST WITH CONTRAST  TECHNIQUE: Multidetector CT imaging of the chest was  performed during intravenous contrast administration.  CONTRAST:  77mL OMNIPAQUE IOHEXOL 300 MG/ML  SOLN  COMPARISON:  NM PET IMAGE INITIAL (PI) SKULL BASE TO THIGH dated 10/19/2013; DG CHEST 1V PORT dated 10/04/2013; CT CHEST W/CM dated 09/22/2013  FINDINGS: There has been significant interval cavitation of the dominant suprahilar left upper lobe mass. This lesion now measures 3.2 x 2.8 cm on image 16 and has irregular relatively thin walls. This likely communicates with the left upper lobe bronchus, best seen on the coronal images. There is a residual adjacent 1.4 cm solid component more superiorly on image 10. An ill-defined ground-glass density more peripherally in the left upper lobe on image 17 has probably not significantly changed. There is a small ground-glass density in the right upper lobe on image 31 which was not present previously. No new or enlarging pulmonary nodules are identified.  There is a new small dependent left pleural effusion. There is no right pleural effusion or pericardial effusion. The previously demonstrated prominent AP window and left hilar nodes have decreased in size. There are no pathologically enlarged mediastinal or hilar lymph nodes.  Atherosclerosis of the aorta, coronary arteries and great vessels is again noted. The heart size is normal. The visualized upper abdomen has a stable appearance with a stable septated splenic lesion measuring 4.6 cm on image 67.  There are no worrisome osseous findings.  IMPRESSION: 1. Interval significant cavitation and retraction of the dominant left upper lobe suprahilar mass. This now likely communicates with the left upper lobe bronchus. An adjacent solid nodule more superiorly at the left apex is now better defined, but not significantly larger. 2. Nonspecific patchy ground-glass opacities in both upper lobes. . Attention on follow-up recommended. 3. Improvement in AP window and left hilar adenopathy. 4. Stable septated cystic splenic  lesion.   Electronically Signed   By: Camie Patience M.D.   On: 01/28/2014 11:49   Dg Chest Port 1 View  02/15/2014   CLINICAL DATA:  Short of breath.  EXAM: PORTABLE CHEST - 1 VIEW  COMPARISON:  02/15/2014 at 2:07 a.m.  FINDINGS: Left upper lobe changes from treated lung carcinoma are stable from the earlier study and a prior CT  Lungs are hyperexpanded. No areas of lung consolidation are noted to suggest pneumonia. No pulmonary edema.  Cardiac silhouette is normal in size. Normal mediastinal and hilar contours.  IMPRESSION: No acute findings and no change from the study performed earlier this same day.   Electronically Signed   By: Lajean Manes M.D.   On: 02/15/2014 09:23    Oren Binet, MD  [facility] Pager:[phone]  If 7PM-7AM, please contact night-coverage [email or web address] Password [email or web address] 02/15/2014, 1:10 PM   LOS: 1 day

## 2014-02-16 DIAGNOSIS — C3410 Malignant neoplasm of upper lobe, unspecified bronchus or lung: Secondary | ICD-10-CM

## 2014-02-16 DIAGNOSIS — D6959 Other secondary thrombocytopenia: Secondary | ICD-10-CM

## 2014-02-16 DIAGNOSIS — D72829 Elevated white blood cell count, unspecified: Secondary | ICD-10-CM

## 2014-02-16 DIAGNOSIS — J449 Chronic obstructive pulmonary disease, unspecified: Secondary | ICD-10-CM

## 2014-02-16 DIAGNOSIS — J189 Pneumonia, unspecified organism: Secondary | ICD-10-CM

## 2014-02-16 LAB — CBC
HCT: 33.7 % — ABNORMAL LOW (ref 39.0–52.0)
Hemoglobin: 11.2 g/dL — ABNORMAL LOW (ref 13.0–17.0)
MCH: 31.7 pg (ref 26.0–34.0)
MCHC: 33.2 g/dL (ref 30.0–36.0)
MCV: 95.5 fL (ref 78.0–100.0)
Platelets: 90 10*3/uL — ABNORMAL LOW (ref 150–400)
RBC: 3.53 MIL/uL — ABNORMAL LOW (ref 4.22–5.81)
RDW: 15.3 % (ref 11.5–15.5)
WBC: 16.5 10*3/uL — AB (ref 4.0–10.5)

## 2014-02-16 LAB — BASIC METABOLIC PANEL
BUN: 14 mg/dL (ref 6–23)
CO2: 23 mEq/L (ref 19–32)
CREATININE: 0.8 mg/dL (ref 0.50–1.35)
Calcium: 9.1 mg/dL (ref 8.4–10.5)
Chloride: 99 mEq/L (ref 96–112)
GFR calc non Af Amer: >90 mL/min (ref >90)
Glucose, Bld: 188 mg/dL — ABNORMAL HIGH (ref 70–99)
POTASSIUM: 4.2 meq/L (ref 3.7–5.3)
Sodium: 135 mEq/L — ABNORMAL LOW (ref 137–147)

## 2014-02-16 LAB — URINE CULTURE
Colony Count: NO GROWTH
Culture: NO GROWTH

## 2014-02-16 LAB — LEGIONELLA ANTIGEN, URINE: LEGIONELLA ANTIGEN, URINE: NEGATIVE

## 2014-02-16 LAB — VANCOMYCIN, TROUGH: VANCOMYCIN TR: 14.5 ug/mL (ref 10.0–20.0)

## 2014-02-16 MED ORDER — CARVEDILOL 6.25 MG PO TABS
6.2500 mg | ORAL_TABLET | Freq: Two times a day (BID) | ORAL | Status: DC
  Administered 2014-02-16 – 2014-02-17 (×3): 6.25 mg via ORAL
  Filled 2014-02-16 (×4): qty 1

## 2014-02-16 MED ORDER — PREDNISONE 50 MG PO TABS
60.0000 mg | ORAL_TABLET | Freq: Two times a day (BID) | ORAL | Status: DC
  Administered 2014-02-16 – 2014-02-17 (×3): 60 mg via ORAL
  Filled 2014-02-16 (×4): qty 1

## 2014-02-16 NOTE — Progress Notes (Signed)
ANTIBIOTIC CONSULT NOTE - FOLLOW UP  Pharmacy Consult for Vancomycin Indication: pneumonia  No Known Allergies  Patient Measurements: Height: 5\' 7"  (170.2 cm) Weight: 184 lb 4.9 oz (83.6 kg) IBW/kg (Calculated) : 66.1  Vital Signs: Temp: 97.7 F (36.5 C) (03/25 2051) Temp src: Oral (03/25 2051) BP: 114/55 mmHg (03/25 2051) Pulse Rate: 62 (03/25 2051) Intake/Output from previous day: 03/24 0701 - 03/25 0700 In: 660 [P.O.:360; IV Piggyback:300] Out: 1400 [Urine:1400]  Labs:  Recent Labs  02/15/14 0100 02/15/14 0200 02/15/14 0510 02/16/14 0330  WBC 10.9*  --  9.6 16.5*  HGB 12.5* 12.9* 11.8* 11.2*  PLT 94*  --  92* 90*  CREATININE 0.81 0.90 0.83 0.80   Estimated Creatinine Clearance: 102.8 ml/min (by C-G formula based on Cr of 0.8).  Recent Labs  02/16/14 2056  [place] 14.5    Microbiology: 3/24 influenza panel: neg 3/24 blood x2: NGTD 3/24 urine: NG  Anti-infectives: 3/24 >> Fortaz x 1 3/24 >> Cefepime >> 3/24 >> Vanc >>   Assessment: 23 yoM admitted from home with SOB, cough, fever. Hx includes COPD and squamous cell lung cancer with last chemo on 3/18.  Pharmacy is consulted to dose vancomycin.  Day #2 Vancomycin and Cefepime  Tmax: AF  WBCs: bump to 16.5, (Neulasta 3/18, solumedrol started 3/24)  Renal: SCr 0.8, stable, CrCl >100 ml/min  Vancomycin trough level: 14.5, very near therapeutic goal range.  Expect some further accumulation with frequent q8h dosing.  Goal of Therapy:  Vancomycin trough level 15-20 mcg/ml  Plan:   Continue cefepime 1g IV q8h  Continue Vancomycin 1g IV q8h.  Recheck Vanc trough as needed.  Follow up renal fxn and culture results.   Gretta Arab PharmD, BCPS Pager [phone] 02/16/2014 9:14 PM

## 2014-02-16 NOTE — Progress Notes (Signed)
No changes in initial PM assessment at this time. Pt resting quietly in bed with eyes closed. No complaints at this time. Continue with plan of care

## 2014-02-16 NOTE — Progress Notes (Signed)
Came to visit patient at bedside to offer and explain [facility] Care Management services. Consents obtained. Made him aware that he will receive post hospital discharge calls and will be evaluated for monthly home visits. Left [facility] Care Management packet at bedside. Appreciative of visit. Made inpatient RN case manager aware.  Marthenia Rolling, MSN- [facility] [phone]

## 2014-02-16 NOTE — Progress Notes (Signed)
[facility] PROGRESS NOTE  Ernest [name] OJJ:[id number] DOB: [birth year] DOA: 02/14/2014 PCP: Drema Pry, DO  Assessment/Plan  COPD with exacerbation, improving - change to oral steroids - continue nebs and oxygen as needed -  Continue abx as below  SIRs suspect PNA-not seen on CXR.  -  await cultures  -  C/w Vanco/Cefepime day 2  Suspected HCAP, non toxic looking,103 F fever on admission, low grade fever since then. Although CXR neg-clinical symptoms consistent with PNA. Patient immunocompromised, with hx of Sq cell Lung ca and getting chemo as outpatient.  - await Blood cultures  -  S. pneumo neg, legionella neg - c/w Vanco/Cefepime day 2  Chest tightness, resolved, likely secondary to bronchospasm  -  troponin neg, ECHO neg for wall motion abnormality -  Doppler of the lower extremity neg -  known hx of CAD-non obstructive-Nuclear Stress Test on 11/16/13 neg  -  Telemetry: Normal sinus rhythm with heart rate in the 60s, okay to DC telemetry  HTN blood pressures low normal -  decrease Coreg (consider changing to nonselective BB) -  Continue Imdur and Losartan   GERD stable -Continue Protonix   Squamous cell lung cancer  -recently completed RTx, getting ChemoTx - appreciate oncology assistance, [name] following  Anxiety/Depression stable -c/w Prozac, Remeron,Abilify and as needed Klonopin   Chronic Pain synd stable -c/w Narcotics Leukocytosis, worsening, likely secondary to steroids superimposed on possible pneumonia  Normocytic anemia, likely secondary to chemotherapy and chronic disease -  Transfuse for hemoglobin less than 7  Thrombocytopenia, extending down, possibly secondary to recent chemotherapy -  Repeat CBC in a.m. -  Hold Lovenox if platelets follow less than 50 thousand  Diet:  Healthy heart Access:  PIV IVF:  off Proph:  lovenox   Code Status: full Family Communication: Spoke to patient alone Disposition Plan: Pending  improvement in breathing, home once he is able to ambulate Ernest [name] distances and complete ADLs without severe dyspnea, evaluate for home oxygen requirement   Consultants:  Oncology  Procedures:  CXR  Antibiotics:  Vancomycin 3/24 >>   cefepime 3/24 >>    HPI/Subjective:  Patient states that his breathing is improved. He is still coughing some and wheezy. He is not been up except to the bathroom and he does get somewhat Ernest [name] of breath with this. He denies chest tightness or chest pain.  Objective: Filed Vitals:   02/15/14 2104 02/16/14 0435 02/16/14 0815 02/16/14 1330  BP:  108/53  105/52  Pulse:  70  68  Temp:  98.1 F (36.7 C)  98.6 F (37 C)  TempSrc:  Oral  Oral  Resp:  28  22  Height:      Weight:  83.6 kg (184 lb 4.9 oz)    SpO2: 91% 92% 95% 94%    Intake/Output Summary (Last 24 hours) at 02/16/14 1536 Last data filed at 02/16/14 1300  Gross per 24 hour  Intake    850 ml  Output   1600 ml  Net   -750 ml   Filed Weights   02/14/14 2337 02/15/14 0544 02/16/14 0435  Weight: 83.008 kg (183 lb) 84.233 kg (185 lb 11.2 oz) 83.6 kg (184 lb 4.9 oz)    Exam:   General:  Caucasian male, No acute distress  HEENT:  NCAT, MMM  Cardiovascular:  RRR, nl S1, S2 no mrg, 2+ pulses, warm extremities  Respiratory:  Diminished bilateral breath sounds with full expiratory wheeze throughout, no focal rales or rhonchi, no increased  WOB  Abdomen:   NABS, soft, NT/ND  MSK:   Normal tone and bulk, no LEE  Neuro:  Grossly intact  Data Reviewed: Basic Metabolic Panel:  Recent Labs Lab 02/15/14 0100 02/15/14 0200 02/15/14 0510 02/16/14 0330  NA 133* 133* 132* 135*  K 3.9 3.7 3.8 4.2  CL 93* 97 94* 99  CO2 24  --  23 23  GLUCOSE 134* 132* 159* 188*  BUN 10 9 10 14   CREATININE 0.81 0.90 0.83 0.80  CALCIUM 9.1  --  8.8 9.1   Liver Function Tests:  Recent Labs Lab 02/15/14 0100 02/15/14 0510  AST 19 15  ALT 20 17  ALKPHOS 144* 132*  BILITOT 0.6 0.6  PROT  6.3 6.2  ALBUMIN 3.6 3.4*   No results found for this basename: LIPASE, AMYLASE,  in the last 168 hours No results found for this basename: AMMONIA,  in the last 168 hours CBC:  Recent Labs Lab 02/15/14 0100 02/15/14 0200 02/15/14 0510 02/16/14 0330  WBC 10.9*  --  9.6 16.5*  NEUTROABS 7.1  --  6.5  --   HGB 12.5* 12.9* 11.8* 11.2*  HCT 37.7* 38.0* 34.9* 33.7*  MCV 95.9  --  94.8 95.5  PLT 94*  --  92* 90*   Cardiac Enzymes:  Recent Labs Lab 02/15/14 0410 02/15/14 0944 02/15/14 1630  TROPONINI <0.30 <0.30 <0.30   BNP (last 3 results)  Recent Labs  10/03/13 2125  PROBNP 64.7   CBG: No results found for this basename: GLUCAP,  in the last 168 hours  Recent Results (from the past 240 hour(s))  CULTURE, BLOOD (ROUTINE X 2)     Status: None   Collection Time    02/15/14  1:47 AM      Result Value Ref Range Status   Specimen Description BLOOD RIGHT ARM   Final   Special Requests BOTTLES DRAWN AEROBIC AND ANAEROBIC 3CC   Final   Culture  Setup Time     Final   Value: 02/15/2014 08:20     Performed at [facility]   Culture     Final   Value:        BLOOD CULTURE RECEIVED NO GROWTH TO DATE CULTURE WILL BE HELD FOR 5 DAYS BEFORE ISSUING A FINAL NEGATIVE REPORT     Performed at [facility]   Report Status PENDING   Incomplete  CULTURE, BLOOD (ROUTINE X 2)     Status: None   Collection Time    02/15/14  1:47 AM      Result Value Ref Range Status   Specimen Description BLOOD RIGHT WRIST   Final   Special Requests BOTTLES DRAWN AEROBIC AND ANAEROBIC 5CC   Final   Culture  Setup Time     Final   Value: 02/15/2014 08:19     Performed at [facility]   Culture     Final   Value:        BLOOD CULTURE RECEIVED NO GROWTH TO DATE CULTURE WILL BE HELD FOR 5 DAYS BEFORE ISSUING A FINAL NEGATIVE REPORT     Performed at [facility]   Report Status PENDING   Incomplete  URINE CULTURE     Status: None   Collection Time    02/15/14  1:59 AM       Result Value Ref Range Status   Specimen Description URINE, CLEAN CATCH   Final   Special Requests Immunocompromised   Final  Culture  Setup Time     Final   Value: 02/15/2014 09:51     Performed at [place]     Final   Value: NO GROWTH     Performed at [facility]   Culture     Final   Value: NO GROWTH     Performed at [facility]   Report Status 02/16/2014 FINAL   Final     Studies: Dg Chest 2 View  02/15/2014   CLINICAL DATA:  Fever, cough and shortness of breath.  EXAM: CHEST  2 VIEW  COMPARISON:  Chest x-ray 10/04/2013.  FINDINGS: No acute consolidative airspace disease. Small left pleural effusion. Cavitary area in the medial aspect of the left upper lobe appears similar to recent chest CT 01/28/2014. There soft tissue fullness in the medial aspect of the left upper lobe adjacent to this area of cavitation, compatible with residual nodularity as demonstrated on recent chest CT. Right lung appears clear. No evidence of pulmonary edema. Heart size is normal. Orthopedic fixation hardware in the lower cervical spine.  IMPRESSION: 1. Chronic changes in the medial aspect of the left upper lobe related to treated lung cancer, similar to recent prior chest CT, as detailed above. 2. Small left pleural effusion is unchanged.   Electronically Signed   By: Vinnie Langton M.D.   On: 02/15/2014 02:17   Dg Chest Port 1 View  02/15/2014   CLINICAL DATA:  Alianna Wurster of breath.  EXAM: PORTABLE CHEST - 1 VIEW  COMPARISON:  02/15/2014 at 2:07 a.m.  FINDINGS: Left upper lobe changes from treated lung carcinoma are stable from the earlier study and a prior CT  Lungs are hyperexpanded. No areas of lung consolidation are noted to suggest pneumonia. No pulmonary edema.  Cardiac silhouette is normal in size. Normal mediastinal and hilar contours.  IMPRESSION: No acute findings and no change from the study performed earlier this same day.   Electronically Signed   By:  Lajean Manes M.D.   On: 02/15/2014 09:23    Scheduled Meds: . ARIPiprazole  5 mg Oral QHS  . Armodafinil  150 mg Oral Daily  . atorvastatin  20 mg Oral q1800  . carvedilol  25 mg Oral BID WC  . ceFEPime (MAXIPIME) IV  1 g Intravenous 3 times per day  . FLUoxetine  40 mg Oral Daily  . gabapentin  100 mg Oral Daily  . guaiFENesin  600 mg Oral BID  . ipratropium-albuterol  3 mL Nebulization TID  . isosorbide mononitrate  30 mg Oral Daily  . losartan  25 mg Oral Daily  . methylPREDNISolone (SOLU-MEDROL) injection  40 mg Intravenous Q12H  . mirtazapine  22.5 mg Oral QHS  . mometasone-formoterol  2 puff Inhalation BID  . morphine  60 mg Oral BID  . pantoprazole  40 mg Oral Daily  . vancomycin  1,000 mg Intravenous Q8H   Continuous Infusions: . sodium chloride 50 mL/hr at 02/15/14 2346    Principal Problem:   COPD with exacerbation Active Problems:   HYPERTENSION   CORONARY ATHEROSCLEROSIS NATIVE CORONARY ARTERY   UNSPECIFIED PERIPHERAL VASCULAR DISEASE   GERD   Squamous cell lung cancer   Sepsis    Time spent: 30 min    Ernest [name], [facility] Pager [phone]. If 7PM-7AM, please contact night-coverage at [email or web address], password [facility] 02/16/2014, 3:36 PM  LOS: 2 days

## 2014-02-16 NOTE — Progress Notes (Signed)
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   PRIOR THERAPY: Concurrent chemoradiation with weekly carboplatin for AUC of 2 and paclitaxel 45 mg/M2, status post 7 cycles, last dose was given 12/20/2013. First dose on 11/01/2013.   CURRENT THERAPY: consolidation chemotherapy with carboplatin for AUC of 5 and paclitaxel 175 mg/M2 every 3 weeks with Neulasta support. First cycle on 02/08/2014.   CHEMOTHERAPY INTENT: Curative/control   CURRENT # OF CHEMOTHERAPY CYCLES: 1. Day 1  was on 3/17 with Neulasta on 3/18   HPI: Ernest [name]  Presented on 3/24 with productive cough and shortness of breath accompanied by fever of 103, especially in exertion. He also complaints of chest tightness, with negative EKG and troponins.Chest x-ray was negative for acute findings, and a known small left pleural effusion was again seen.He was diagnosed with COPD exacerbation, which was immediately addressed, for which he received IV antibiotics, after cultures obtained (as the patient is at risk for immunocompromise),IV Solu-Medrol and nebulizers,with good response. He is feeling better this morniong. While hospitalized, with have been informed of the patient's admission   MEDICATIONS: Scheduled Meds: . ARIPiprazole  5 mg Oral QHS  . Armodafinil  150 mg Oral Daily  . atorvastatin  20 mg Oral q1800  . carvedilol  25 mg Oral BID WC  . ceFEPime (MAXIPIME) IV  1 g Intravenous 3 times per day  . FLUoxetine  40 mg Oral Daily  . gabapentin  100 mg Oral Daily  . guaiFENesin  600 mg Oral BID  . ipratropium-albuterol  3 mL Nebulization TID  . isosorbide mononitrate  30 mg Oral Daily  . losartan  25 mg Oral Daily  . methylPREDNISolone (SOLU-MEDROL) injection  40 mg Intravenous Q12H   . mirtazapine  22.5 mg Oral QHS  . mometasone-formoterol  2 puff Inhalation BID  . morphine  60 mg Oral BID  . pantoprazole  40 mg Oral Daily  . vancomycin  1,000 mg Intravenous Q8H   Continuous Infusions: . sodium chloride 50 mL/hr at 02/15/14 2346   PRN Meds:.acetaminophen, albuterol, ALPRAZolam, chlorpheniramine-HYDROcodone, clonazePAM, methocarbamol, nitroGLYCERIN, oxyCODONE, oxyCODONE-acetaminophen ALLERGIES:  No Known Allergies   PHYSICAL EXAMINATION:   Filed Vitals:   02/16/14 0435  BP: 108/53  Pulse: 70  Temp: 98.1 F (36.7 C)  Resp: 28     Filed Weights   02/14/14 2337 02/15/14 0544 02/16/14 0435  Weight: 183 lb (83.008 kg) 185 lb 11.2 oz (84.233 kg) 184 lb 4.9 oz (83.45 kg)    58 year old  in no acute distress, alert and oriented x3  General well-developed and well-nourished  HEENT: Normocephalic, atraumatic, PERRLA. Oral cavity without thrush or lesions. Neck supple. no thyromegaly, no cervical or supraclavicular adenopathy  Lungs decreased breath sounds at the bases. Positive expiratory wheezing, rhonchi , mild crackles Cardiac: regular rate and rhythm,no murmur , rubs or gallops Abdomen soft nontender , bowel sounds x4. No hepatosplenomegaly Extremities no clubbing cyanosis or edema. No  petechial rash Neuro: non focal  LABORATORY/RADIOLOGY DATA:   Recent Labs Lab 02/15/14 0100 02/15/14 0200 02/15/14 0510 02/16/14 0330  WBC 10.9*  --  9.6 16.5*  HGB 12.5* 12.9* 11.8* 11.2*  HCT 37.7* 38.0* 34.9* 33.7*  PLT 94*  --  92* 90*  MCV 95.9  --  94.8 95.5  MCH 31.8  --  32.1 31.7  MCHC 33.2  --  33.8 33.2  RDW 15.4  --  15.5 15.3  LYMPHSABS 0.9  --  1.8  --   MONOABS 2.7*  --  1.2*  --   EOSABS 0.1  --  0.1  --   BASOSABS 0.1  --  0.0  --     CMP    Recent Labs Lab 02/15/14 0100 02/15/14 0200 02/15/14 0510 02/16/14 0330  NA 133* 133* 132* 135*  K 3.9 3.7 3.8 4.2  CL 93* 97 94* 99  CO2 24  --  23 23  GLUCOSE 134* 132* 159* 188*  BUN 10  9 10 14   CREATININE 0.81 0.90 0.83 0.80  CALCIUM 9.1  --  8.8 9.1  AST 19  --  15  --   ALT 20  --  17  --   ALKPHOS 144*  --  132*  --   BILITOT 0.6  --  0.6  --         Component Value Date/Time   BILITOT 0.6 02/15/2014 0510   BILITOT 0.33 02/08/2014 0902   BILIDIR 0.1 04/27/2013 1112    Anemia panel:       Component Value Date/Time   ESRSEDRATE 4 06/04/2007 1500       Urinalysis    Component Value Date/Time   COLORURINE YELLOW 02/15/2014 0159   APPEARANCEUR CLEAR 02/15/2014 0159   LABSPEC 1.020 02/15/2014 0159   PHURINE 8.0 02/15/2014 0159   GLUCOSEU NEGATIVE 02/15/2014 0159   HGBUR NEGATIVE 02/15/2014 0159   BILIRUBINUR NEGATIVE 02/15/2014 0159   BILIRUBINUR n 08/14/2011 1449   KETONESUR NEGATIVE 02/15/2014 0159   PROTEINUR NEGATIVE 02/15/2014 0159   UROBILINOGEN 1.0 02/15/2014 0159   UROBILINOGEN 0.2 08/14/2011 1449   NITRITE NEGATIVE 02/15/2014 0159   NITRITE n 08/14/2011 1449   LEUKOCYTESUR NEGATIVE 02/15/2014 0159    Drugs of Abuse  No results found for this basename: labopia, cocainscrnur, labbenz, amphetmu, thcu, labbarb     Liver Function Tests:  Recent Labs Lab 02/15/14 0100 02/15/14 0510  AST 19 15  ALT 20 17  ALKPHOS 144* 132*  BILITOT 0.6 0.6  PROT 6.3 6.2  ALBUMIN 3.6 3.4*    Cardiac Enzymes:  Recent Labs Lab 02/15/14 0410 02/15/14 0944 02/15/14 1630  TROPONINI <0.30 <0.30 <0.30     Radiology Studies:  Dg Chest 2 View  02/15/2014   CLINICAL DATA:  Fever, cough and shortness of breath.  EXAM: CHEST  2 VIEW  COMPARISON:  Chest x-ray 10/04/2013.  FINDINGS: No acute consolidative airspace disease. Small left pleural effusion. Cavitary area in the medial aspect of the left upper lobe appears similar to recent chest CT 01/28/2014. There soft tissue fullness in the medial aspect of the left upper lobe adjacent to this area of cavitation, compatible with residual nodularity as demonstrated on recent chest CT. Right lung appears clear. No evidence  of pulmonary edema. Heart size is normal. Orthopedic fixation hardware in the lower cervical spine.  IMPRESSION: 1. Chronic changes in the medial aspect of the left upper lobe related to treated lung cancer, similar to recent prior chest CT, as detailed above. 2. Small left pleural effusion is unchanged.   Electronically Signed   By: Vinnie Langton M.D.   On: 02/15/2014 02:17   Ct Chest W Contrast  01/28/2014   CLINICAL DATA:  Lung cancer diagnosed in December 2014. Chemotherapy and radiation therapy completed.  EXAM: CT CHEST WITH CONTRAST  TECHNIQUE: Multidetector CT imaging  of the chest was performed during intravenous contrast administration.  CONTRAST:  13mL OMNIPAQUE IOHEXOL 300 MG/ML  SOLN  COMPARISON:  NM PET IMAGE INITIAL (PI) SKULL BASE TO THIGH dated 10/19/2013; DG CHEST 1V PORT dated 10/04/2013; CT CHEST W/CM dated 09/22/2013  FINDINGS: There has been significant interval cavitation of the dominant suprahilar left upper lobe mass. This lesion now measures 3.2 x 2.8 cm on image 16 and has irregular relatively thin walls. This likely communicates with the left upper lobe bronchus, best seen on the coronal images. There is a residual adjacent 1.4 cm solid component more superiorly on image 10. An ill-defined ground-glass density more peripherally in the left upper lobe on image 17 has probably not significantly changed. There is a small ground-glass density in the right upper lobe on image 31 which was not present previously. No new or enlarging pulmonary nodules are identified.  There is a new small dependent left pleural effusion. There is no right pleural effusion or pericardial effusion. The previously demonstrated prominent AP window and left hilar nodes have decreased in size. There are no pathologically enlarged mediastinal or hilar lymph nodes.  Atherosclerosis of the aorta, coronary arteries and great vessels is again noted. The heart size is normal. The visualized upper abdomen has a stable  appearance with a stable septated splenic lesion measuring 4.6 cm on image 67.  There are no worrisome osseous findings.  IMPRESSION: 1. Interval significant cavitation and retraction of the dominant left upper lobe suprahilar mass. This now likely communicates with the left upper lobe bronchus. An adjacent solid nodule more superiorly at the left apex is now better defined, but not significantly larger. 2. Nonspecific patchy ground-glass opacities in both upper lobes. . Attention on follow-up recommended. 3. Improvement in AP window and left hilar adenopathy. 4. Stable septated cystic splenic lesion.   Electronically Signed   By: Camie Patience M.D.   On: 01/28/2014 11:49   Dg Chest Port 1 View  02/15/2014   CLINICAL DATA:  Short of breath.  EXAM: PORTABLE CHEST - 1 VIEW  COMPARISON:  02/15/2014 at 2:07 a.m.  FINDINGS: Left upper lobe changes from treated lung carcinoma are stable from the earlier study and a prior CT  Lungs are hyperexpanded. No areas of lung consolidation are noted to suggest pneumonia. No pulmonary edema.  Cardiac silhouette is normal in size. Normal mediastinal and hilar contours.  IMPRESSION: No acute findings and no change from the study performed earlier this same day.   Electronically Signed   By: Lajean Manes M.D.   On: 02/15/2014 09:23       ASSESSMENT AND PLAN: 71. 58 year-old white male with stage IIIA non-small cell lung cancer, squamous cell carcinoma currently undergoing concurrent chemoradiation with weekly carboplatin and paclitaxel status post 7 weeks of treatment. He tolerated his treatment fairly well with no significant adverse effects.  His recent scan showed significant improvement in his disease. Currently on consolidation chemotherapy with carboplatin for AUC of 5 and paclitaxel 175 mg/M2 giving every 3 weeks with Neulasta.Cycle 1 was given on 3/17 with Neulasta on 3/18. Due for C2 on 4/7  2. Thrombocytopenia, secondary to recent chemotherapy  3.  COPD  exacerbation, improved with nebulizers and broad spectrum antibiotics  4. Chest discomfort, negative EKG, troponins negative. Echo pending   Rondel Jumbo, PA-C 02/16/2014, 7:30 AM  Hematology/oncology Attending: The patient is seen and examined today. I agree with the above note. This is a very pleasant 58 years old white male  with a stage IIIa non-small cell lung cancer status post a course of concurrent chemoradiation and currently undergoing consolidation chemotherapy with carboplatin and paclitaxel followed by Neulasta. His first dose of the chemotherapy was given on 02/08/2014. The patient was admitted with increasing fatigue and weakness as well as fever, cough and shortness of breath. He is currently treated for questionable pneumonia with COPD exacerbation. He is responding very well to the treatment with antibiotics and steroids. He has leukocytosis most likely secondary to Neulasta injection as well as a steroid treatment. Continue current care. The patient will have a followup appointment with me after discharge for evaluation before starting the second cycle of his chemotherapy. Thank you so much for taking good care of Ernest [name]. I will continue to follow the patient with you and assist in his management on as needed basis.

## 2014-02-17 MED ORDER — LEVOFLOXACIN 750 MG PO TABS: 750.0000 mg | ORAL_TABLET | Freq: Every day | ORAL | Status: DC

## 2014-02-17 MED ORDER — ALBUTEROL SULFATE (2.5 MG/3ML) 0.083% IN NEBU: 2.5000 mg | INHALATION_SOLUTION | RESPIRATORY_TRACT | Status: DC | PRN

## 2014-02-17 MED ORDER — ENOXAPARIN SODIUM 40 MG/0.4ML ~~LOC~~ SOLN
40.0000 mg | SUBCUTANEOUS | Status: DC
  Administered 2014-02-17: 40 mg via SUBCUTANEOUS
  Filled 2014-02-17: qty 0.4

## 2014-02-17 MED ORDER — METOPROLOL SUCCINATE 12.5 MG HALF TABLET: 12.5000 mg | ORAL_TABLET | Freq: Every day | ORAL | Status: DC

## 2014-02-17 MED ORDER — PREDNISONE 20 MG PO TABS: ORAL_TABLET | ORAL | Status: DC

## 2014-02-17 NOTE — Progress Notes (Signed)
With ambulation in hallway pt o2 stat was 93-94% on RA.  Pt complains of SOB with activity. Annette Liotta A

## 2014-02-17 NOTE — Discharge Summary (Signed)
Physician Discharge Summary  Ernest [name] AYT:[id number] DOB: [birth year] DOA: 02/14/2014  PCP: Drema Pry, DO  Admit date: 02/14/2014 Discharge date: 02/17/2014  Recommendations for Outpatient Follow-up:  1. Follow up with pulmonology in 2 weeks for reexamination 2. PCP in 1 week.  Check BP and HR after changing coreg to metoprolol.    Discharge Diagnoses:  Principal Problem:   COPD with exacerbation Active Problems:   HYPERTENSION   CORONARY ATHEROSCLEROSIS NATIVE CORONARY ARTERY   UNSPECIFIED PERIPHERAL VASCULAR DISEASE   GERD   Squamous cell lung cancer   Sepsis   Discharge Condition: Stable, improved  Diet recommendation: Healthy heart  Wt Readings from Last 3 Encounters:  02/17/14 84.278 kg (185 lb 12.8 oz)  02/01/14 85.548 kg (188 lb 9.6 oz)  02/01/14 85.548 kg (188 lb 9.6 oz)    History of present illness:  AIDYNN [name] is a 58 y.o. male with Past medical history of COPD, coronary artery disease, GERD, peripheral vascular disease status post angioplasty, gout, squamous cell carcinoma on ongoing chemotherapy.  The patient is coming from home.  The patient presented with complaints of cough and shortness of breath. He mentions his cough is productive with whitish thick sputum which is different than his regular cough which is nonproductive. He also has shortness of breath which is primarily on exertion about also addressed. He complains of some tightness in his chest specifically when he has a cough and he tries to take a deep breath. He denies any paretic nature of pain. He denies any swelling of his legs orthopnea or PND. He denies any nausea or vomiting. There is no oral ulcer. He denies any abdominal pain diarrhea or constipation. He denies any burning urination.  He denies any sick contact or recent travel. He mentions he is compliant with his medication.   Hospital Course:   Acute COPD with exacerbation, he was started on nasal cannula and given IV  steroids as well as dilators areas on the day of discharge was able to ambulate in the hall with out oxygen and maintain oxygen saturations greater than 80%. He will continue steroid taper at home. Recommend he followup with his primary pulmonologist for  further management of his medications for COPD.  Suspected healthcare associated pneumonia, nontoxic looking, fever to 103 on admission.  CXR unremarkable. Influenza PCR was negative. Blood cultures are no growth to date. Urine strep pneumonia and Legionella antigens were negative. He was started on broad-spectrum antibiotics with vancomycin and cefepime, however ACE has been transitioned to levofloxacin to complete a seven-day course of antibiotics. His temperature trended down, and he was feeling well and the date of discharge.  Chest tightness, was likely related to bronchospasm. Hysterometry demonstrated normal sinus rhythm at a heart rate in the 60s. History parents were negative. Echocardiogram was negative for wall motion abnormality and had a preserved ejection fraction. Duplex of the lower extent he was negative. He had a nuclear stress test in December of 2014 which was negative. His chest tightness quickly resolved with nebulizer treatments.  Hypertension, blood pressures were low normal.  This may be secondary to possible underlying infection.  continue losartan and imdur and beta blocker was changed to low dose metoprolol (nonselective for COPD) until he is able to followup with his primary care doctor.   GERD, stable, continue Protonix  Squamous cell lung cancer, recently completed radiotherapy and is receiving ongoing chemotherapy under the supervision of Doctor [facility].  Depression and anxiety, stable. Continue Prozac, Remeron, Abilify and  as needed Klonopin.  Chronic pain, stable on home dose of Protonix.  Normocytic anemia which is likely secondary to chemotherapy and chronic disease. He did not record her blood  transfusion.  Thrombocytopenia, trending down, possibly secondary to recent chemotherapy. Platelets stabilized around 90,000, and he should have a repeat CBC done by his oncologist at his next appointment.  Consultants:  Oncology Procedures:  CXR Antibiotics:  Vancomycin 3/24 >>  cefepime 3/24 >>    Discharge Exam: Filed Vitals:   02/17/14 1349  BP: 115/60  Pulse: 62  Temp: 97.2 F (36.2 C)  Resp: 20   Filed Vitals:   02/17/14 0440 02/17/14 1015 02/17/14 1349 02/17/14 1552  BP: 119/65  115/60   Pulse: 66  62   Temp: 97.6 F (36.4 C)  97.2 F (36.2 C)   TempSrc: Oral  Oral   Resp: 22  20   Height:      Weight: 84.278 kg (185 lb 12.8 oz)     SpO2: 98% 98% 98% 93%    General: Caucasian male, No acute distress  HEENT: NCAT, MMM  Cardiovascular: RRR, nl S1, S2 no mrg, 2+ pulses, warm extremities  Respiratory: Diminished but improved bilateral BS, no expiratory wheeze, no focal rales or rhonchi, no increased WOB  Abdomen: NABS, soft, NT/ND  MSK: Normal tone and bulk, no LEE  Neuro: Grossly intact   Discharge Instructions      Discharge Orders   Future Appointments Provider Department Dept Phone   02/22/2014 9:00 AM [facility] Only [facility] [phone]   03/01/2014 9:15 AM [facility] 2 [facility] [phone]   03/01/2014 9:45 AM Curt Bears, MD [facility] [phone]   03/01/2014 10:30 AM [facility] [phone]   03/02/2014 9:30 AM [facility] Inj Nurse [facility] [phone]   03/04/2014 4:00 PM Doe-Hyun Kyra Searles, [place] [facility] [place]   03/08/2014 9:00 AM [facility] Only [facility] [phone]   03/15/2014 9:00 AM [facility] Only [name] [phone]   03/22/2014 9:00 AM [facility] 1 [facility] [phone]   03/22/2014 9:30 AM [facility] [phone]   03/23/2014 9:15 AM [facility] Inj Nurse [facility] [phone]   04/06/2014 9:00 AM Burtis Junes, NP [place] [phone]   04/19/2014 11:00 AM Deneise Lever, MD [facility] [phone]   Future Orders Complete By Expires   Call MD for:  difficulty breathing, headache or visual disturbances  As directed    Call MD for:  extreme fatigue  As directed    Call MD for:  hives  As directed    Call MD for:  persistant dizziness or light-headedness  As directed    Call MD for:  persistant nausea and vomiting  As directed    Call MD for:  severe uncontrolled pain  As directed    Call MD for:  temperature >100.4  As directed    Diet - low sodium heart healthy  As directed    Discharge instructions  As directed    Comments:     You were hospitalized with pneumonia and COPD exacerbation.  Please take levofloxacin antibiotic starting tomorrow and take every day until all the pills are gone.  Please take prednisone  in a tapering dose over the next week as prescribed and use your albuterol as needed for shortness of breath.  Follow up with Dr. Annamaria Boots in a few weeks to make sure you do not need any changes to your COPD medications.  If you develop new fevers or worsening shortness of breath or chest pain, please return to the hospital.  Your blood pressure was a little low in the hospital, but you may continue to take your losartan and imdur.  Your carvedilol, however, can worsen wheezing, so please stop this medication and start taking a similar medication called metoprolol.  Have your primary care doctor check your blood pressure at your next appointment.  Making this change may help your breathing.   Increase activity slowly  As directed        Medication List    STOP taking these medications        amLODipine 10 MG tablet  Commonly known as:  NORVASC     carvedilol 25 MG tablet  Commonly known as:  COREG      TAKE these medications       albuterol (2.5 MG/3ML) 0.083% nebulizer solution  Commonly known as:  PROVENTIL  Take 3 mLs (2.5 mg total) by nebulization every 4 (four) hours as needed for wheezing or shortness of breath.     albuterol 108 (90 BASE) MCG/ACT inhaler  Commonly known as:  PROVENTIL HFA;VENTOLIN HFA  Inhale 2 puffs into the lungs every 4 (four) hours as needed for wheezing or shortness of breath.     ALPRAZolam 0.25 MG tablet  Commonly known as:  XANAX  Take 1 tablet (0.25 mg total) by mouth daily as needed for anxiety.     ARCAPTA NEOHALER 75 MCG Caps  Generic drug:  Indacaterol Maleate  Place 1 capsule into inhaler and inhale daily.     ARIPiprazole 5 MG tablet  Commonly known as:  ABILIFY  Take 1 tablet (5 mg total) by mouth at bedtime.     atorvastatin 20 MG tablet  Commonly known as:  LIPITOR  Take 1 tablet (20 mg total) by mouth daily.     clonazePAM 1 MG tablet  Commonly known as:  KLONOPIN  Take 1 mg by mouth at bedtime as needed. For anxiety/insomnia     esomeprazole 40 MG capsule  Commonly known as:  NEXIUM  Take 1 capsule (40 mg total) by mouth daily before breakfast.     FISH OIL CONCENTRATE 1000 MG Caps  Take 1,000 mg by mouth daily.     FLUoxetine 40 MG capsule  Commonly known as:  PROZAC  Take 40 mg by mouth daily.     Fluticasone-Salmeterol 250-50 MCG/DOSE Aepb  Commonly known as:  ADVAIR DISKUS  Inhale 1 puff into the lungs 2 (two) times daily.     gabapentin 100 MG capsule  Commonly known as:  NEURONTIN  Take 100 mg by mouth daily.     isosorbide mononitrate 30 MG 24 hr tablet  Commonly known as:  IMDUR  Take 1 tablet (30 mg total) by mouth daily.     levofloxacin 750 MG tablet  Commonly known as:  LEVAQUIN  Take 1 tablet (750 mg total) by mouth daily.     losartan 25 MG tablet  Commonly known as:  COZAAR   Take 1 tablet (25 mg total) by mouth daily.     methocarbamol 500 MG tablet  Commonly known as:  ROBAXIN  Take 500 mg by mouth 2 (two)  times daily as needed for muscle spasms.     metoprolol succinate 12.5 mg Tb24 24 hr tablet  Commonly known as:  TOPROL-XL  Take 0.5 tablets (12.5 mg total) by mouth daily.     mirtazapine 15 MG tablet  Commonly known as:  REMERON  Take 22.5 mg by mouth at bedtime.     MS CONTIN 60 MG 12 hr tablet  Generic drug:  morphine  Take 60 mg by mouth 2 (two) times daily.     nitroGLYCERIN 0.4 MG SL tablet  Commonly known as:  NITROSTAT  Place 1 tablet (0.4 mg total) under the tongue every 5 (five) minutes as needed for chest pain.     NUVIGIL 150 MG tablet  Generic drug:  Armodafinil  Take 150 mg by mouth daily.     oxyCODONE-acetaminophen 10-325 MG per tablet  Commonly known as:  PERCOCET  Take 1 tablet by mouth every 6 (six) hours as needed for pain.     potassium chloride SA 20 MEQ tablet  Commonly known as:  K-DUR,KLOR-CON  Take 20 mEq by mouth daily.     predniSONE 20 MG tablet  Commonly known as:  DELTASONE  Take 3 tabs daily x 2 days, 2 tabs daily x 2 days, 1 tab daily x 2 days, 1/2 tab daily x 2 days, then stop.     VOLTAREN 1 % Gel  Generic drug:  diclofenac sodium  Apply 2 g topically daily as needed (for pain).       Follow-up Information   Follow up with Drema Pry, DO. Schedule an appointment as soon as possible for a visit in 1 week.   Specialty:  Internal Medicine   Contact information:   [place] [address] [phone]       Follow up with Deneise Lever, MD. Schedule an appointment as soon as possible for a visit in 2 weeks.   Specialty:  Pulmonary Disease   Contact information:   [address]  [facility] [place] [address] [phone]        The results of significant diagnostics from this hospitalization (including imaging, microbiology, ancillary and  laboratory) are listed below for reference.    Significant Diagnostic Studies: Dg Chest 2 View  02/15/2014   CLINICAL DATA:  Fever, cough and shortness of breath.  EXAM: CHEST  2 VIEW  COMPARISON:  Chest x-ray 10/04/2013.  FINDINGS: No acute consolidative airspace disease. Small left pleural effusion. Cavitary area in the medial aspect of the left upper lobe appears similar to recent chest CT 01/28/2014. There soft tissue fullness in the medial aspect of the left upper lobe adjacent to this area of cavitation, compatible with residual nodularity as demonstrated on recent chest CT. Right lung appears clear. No evidence of pulmonary edema. Heart size is normal. Orthopedic fixation hardware in the lower cervical spine.  IMPRESSION: 1. Chronic changes in the medial aspect of the left upper lobe related to treated lung cancer, similar to recent prior chest CT, as detailed above. 2. Small left pleural effusion is unchanged.   Electronically Signed   By: Vinnie Langton M.D.   On: 02/15/2014 02:17   Ct Chest W Contrast  01/28/2014   CLINICAL DATA:  Lung cancer diagnosed in December 2014. Chemotherapy and radiation therapy completed.  EXAM: CT CHEST WITH CONTRAST  TECHNIQUE: Multidetector CT imaging of the chest was performed during intravenous contrast administration.  CONTRAST:  73mL OMNIPAQUE IOHEXOL 300 MG/ML  SOLN  COMPARISON:  NM PET IMAGE INITIAL (PI) SKULL BASE TO THIGH dated 10/19/2013; DG CHEST 1V PORT dated 10/04/2013; CT CHEST W/CM dated 09/22/2013  FINDINGS: There has been significant interval cavitation of the dominant suprahilar left upper lobe mass. This lesion now measures 3.2 x 2.8 cm on image 16 and has irregular relatively thin walls. This likely communicates with the left upper lobe bronchus, best seen on the coronal images. There is a residual adjacent 1.4 cm solid component more superiorly on image 10. An ill-defined ground-glass density more peripherally in the left upper lobe on image 17 has  probably not significantly changed. There is a small ground-glass density in the right upper lobe on image 31 which was not present previously. No new or enlarging pulmonary nodules are identified.  There is a new small dependent left pleural effusion. There is no right pleural effusion or pericardial effusion. The previously demonstrated prominent AP window and left hilar nodes have decreased in size. There are no pathologically enlarged mediastinal or hilar lymph nodes.  Atherosclerosis of the aorta, coronary arteries and great vessels is again noted. The heart size is normal. The visualized upper abdomen has a stable appearance with a stable septated splenic lesion measuring 4.6 cm on image 67.  There are no worrisome osseous findings.  IMPRESSION: 1. Interval significant cavitation and retraction of the dominant left upper lobe suprahilar mass. This now likely communicates with the left upper lobe bronchus. An adjacent solid nodule more superiorly at the left apex is now better defined, but not significantly larger. 2. Nonspecific patchy ground-glass opacities in both upper lobes. . Attention on follow-up recommended. 3. Improvement in AP window and left hilar adenopathy. 4. Stable septated cystic splenic lesion.   Electronically Signed   By: Camie Patience M.D.   On: 01/28/2014 11:49   Dg Chest Port 1 View  02/15/2014   CLINICAL DATA:  Laylia Mui of breath.  EXAM: PORTABLE CHEST - 1 VIEW  COMPARISON:  02/15/2014 at 2:07 a.m.  FINDINGS: Left upper lobe changes from treated lung carcinoma are stable from the earlier study and a prior CT  Lungs are hyperexpanded. No areas of lung consolidation are noted to suggest pneumonia. No pulmonary edema.  Cardiac silhouette is normal in size. Normal mediastinal and hilar contours.  IMPRESSION: No acute findings and no change from the study performed earlier this same day.   Electronically Signed   By: Lajean Manes M.D.   On: 02/15/2014 09:23    Microbiology: Recent Results  (from the past 240 hour(s))  CULTURE, BLOOD (ROUTINE X 2)     Status: None   Collection Time    02/15/14  1:47 AM      Result Value Ref Range Status   Specimen Description BLOOD RIGHT ARM   Final   Special Requests BOTTLES DRAWN AEROBIC AND ANAEROBIC 3CC   Final   Culture  Setup Time     Final   Value: 02/15/2014 08:20     Performed at [facility]   Culture     Final   Value:        BLOOD CULTURE RECEIVED NO GROWTH TO DATE CULTURE WILL BE HELD FOR 5 DAYS BEFORE ISSUING A FINAL NEGATIVE REPORT     Performed at [facility]   Report Status PENDING   Incomplete  CULTURE, BLOOD (ROUTINE X 2)     Status: None   Collection Time    02/15/14  1:47 AM      Result Value  Ref Range Status   Specimen Description BLOOD RIGHT WRIST   Final   Special Requests BOTTLES DRAWN AEROBIC AND ANAEROBIC 5CC   Final   Culture  Setup Time     Final   Value: 02/15/2014 08:19     Performed at [facility]   Culture     Final   Value:        BLOOD CULTURE RECEIVED NO GROWTH TO DATE CULTURE WILL BE HELD FOR 5 DAYS BEFORE ISSUING A FINAL NEGATIVE REPORT     Performed at [facility]   Report Status PENDING   Incomplete  URINE CULTURE     Status: None   Collection Time    02/15/14  1:59 AM      Result Value Ref Range Status   Specimen Description URINE, CLEAN CATCH   Final   Special Requests Immunocompromised   Final   Culture  Setup Time     Final   Value: 02/15/2014 09:51     Performed at [place]     Final   Value: NO GROWTH     Performed at [facility]   Culture     Final   Value: NO GROWTH     Performed at [facility]   Report Status 02/16/2014 FINAL   Final     Labs: Basic Metabolic Panel:  Recent Labs Lab 02/15/14 0100 02/15/14 0200 02/15/14 0510 02/16/14 0330  NA 133* 133* 132* 135*  K 3.9 3.7 3.8 4.2  CL 93* 97 94* 99  CO2 24  --  23 23  GLUCOSE 134* 132* 159* 188*  BUN 10 9 10 14   CREATININE 0.81 0.90  0.83 0.80  CALCIUM 9.1  --  8.8 9.1   Liver Function Tests:  Recent Labs Lab 02/15/14 0100 02/15/14 0510  AST 19 15  ALT 20 17  ALKPHOS 144* 132*  BILITOT 0.6 0.6  PROT 6.3 6.2  ALBUMIN 3.6 3.4*   No results found for this basename: LIPASE, AMYLASE,  in the last 168 hours No results found for this basename: AMMONIA,  in the last 168 hours CBC:  Recent Labs Lab 02/15/14 0100 02/15/14 0200 02/15/14 0510 02/16/14 0330  WBC 10.9*  --  9.6 16.5*  NEUTROABS 7.1  --  6.5  --   HGB 12.5* 12.9* 11.8* 11.2*  HCT 37.7* 38.0* 34.9* 33.7*  MCV 95.9  --  94.8 95.5  PLT 94*  --  92* 90*   Cardiac Enzymes:  Recent Labs Lab 02/15/14 0410 02/15/14 0944 02/15/14 1630  TROPONINI <0.30 <0.30 <0.30   BNP: BNP (last 3 results)  Recent Labs  10/03/13 2125  PROBNP 64.7   CBG: No results found for this basename: GLUCAP,  in the last 168 hours  Time coordinating discharge: 35 minutes  Signed:  Moni Rothrock  [facility] 02/17/2014, 5:35 PM

## 2014-02-18 ENCOUNTER — Ambulatory Visit: Payer: Medicare HMO | Admitting: Internal Medicine

## 2014-02-21 LAB — CULTURE, BLOOD (ROUTINE X 2)
CULTURE: NO GROWTH
Culture: NO GROWTH

## 2014-02-22 ENCOUNTER — Other Ambulatory Visit (HOSPITAL_BASED_OUTPATIENT_CLINIC_OR_DEPARTMENT_OTHER): Payer: Commercial Managed Care - HMO

## 2014-02-22 DIAGNOSIS — C3410 Malignant neoplasm of upper lobe, unspecified bronchus or lung: Secondary | ICD-10-CM

## 2014-02-22 DIAGNOSIS — C3492 Malignant neoplasm of unspecified part of left bronchus or lung: Secondary | ICD-10-CM

## 2014-02-22 LAB — COMPREHENSIVE METABOLIC PANEL (CC13)
ALBUMIN: 3 g/dL — AB (ref 3.5–5.0)
ALT: 40 U/L (ref 0–55)
AST: 12 U/L (ref 5–34)
Alkaline Phosphatase: 114 U/L (ref 40–150)
Anion Gap: 11 mEq/L (ref 3–11)
BUN: 12.5 mg/dL (ref 7.0–26.0)
CALCIUM: 9.2 mg/dL (ref 8.4–10.4)
CHLORIDE: 99 meq/L (ref 98–109)
CO2: 30 mEq/L — ABNORMAL HIGH (ref 22–29)
Creatinine: 0.8 mg/dL (ref 0.7–1.3)
Glucose: 183 mg/dl — ABNORMAL HIGH (ref 70–140)
POTASSIUM: 3.6 meq/L (ref 3.5–5.1)
Sodium: 140 mEq/L (ref 136–145)
Total Bilirubin: 0.29 mg/dL (ref 0.20–1.20)
Total Protein: 5.8 g/dL — ABNORMAL LOW (ref 6.4–8.3)

## 2014-02-22 LAB — CBC WITH DIFFERENTIAL/PLATELET
BASO%: 0.5 % (ref 0.0–2.0)
BASOS ABS: 0.1 10*3/uL (ref 0.0–0.1)
EOS%: 0.2 % (ref 0.0–7.0)
Eosinophils Absolute: 0 10*3/uL (ref 0.0–0.5)
HCT: 37.5 % — ABNORMAL LOW (ref 38.4–49.9)
HGB: 12.2 g/dL — ABNORMAL LOW (ref 13.0–17.1)
LYMPH%: 10.4 % — ABNORMAL LOW (ref 14.0–49.0)
MCH: 31.7 pg (ref 27.2–33.4)
MCHC: 32.7 g/dL (ref 32.0–36.0)
MCV: 97.1 fL (ref 79.3–98.0)
MONO#: 0.9 10*3/uL (ref 0.1–0.9)
MONO%: 7.6 % (ref 0.0–14.0)
NEUT#: 9.8 10*3/uL — ABNORMAL HIGH (ref 1.5–6.5)
NEUT%: 81.3 % — ABNORMAL HIGH (ref 39.0–75.0)
Platelets: 112 10*3/uL — ABNORMAL LOW (ref 140–400)
RBC: 3.86 10*6/uL — ABNORMAL LOW (ref 4.20–5.82)
RDW: 16.7 % — AB (ref 11.0–14.6)
WBC: 12.1 10*3/uL — ABNORMAL HIGH (ref 4.0–10.3)
lymph#: 1.3 10*3/uL (ref 0.9–3.3)

## 2014-02-24 ENCOUNTER — Telehealth: Payer: Self-pay | Admitting: Internal Medicine

## 2014-02-24 NOTE — Telephone Encounter (Signed)
[facility] at 03-02-14 at 11:15am-wife is aware of appt date and time.

## 2014-03-01 ENCOUNTER — Ambulatory Visit (HOSPITAL_BASED_OUTPATIENT_CLINIC_OR_DEPARTMENT_OTHER): Payer: Commercial Managed Care - HMO

## 2014-03-01 ENCOUNTER — Telehealth: Payer: Self-pay | Admitting: Internal Medicine

## 2014-03-01 ENCOUNTER — Other Ambulatory Visit: Payer: Self-pay

## 2014-03-01 ENCOUNTER — Other Ambulatory Visit (HOSPITAL_BASED_OUTPATIENT_CLINIC_OR_DEPARTMENT_OTHER): Payer: Commercial Managed Care - HMO

## 2014-03-01 ENCOUNTER — Ambulatory Visit (HOSPITAL_BASED_OUTPATIENT_CLINIC_OR_DEPARTMENT_OTHER): Payer: Commercial Managed Care - HMO | Admitting: Internal Medicine

## 2014-03-01 ENCOUNTER — Encounter: Payer: Self-pay | Admitting: Internal Medicine

## 2014-03-01 VITALS — BP 118/59 | HR 68 | Temp 98.2°F | Resp 12

## 2014-03-01 VITALS — BP 136/54 | HR 69 | Temp 98.4°F | Resp 18 | Ht 67.0 in | Wt 187.2 lb

## 2014-03-01 DIAGNOSIS — R079 Chest pain, unspecified: Secondary | ICD-10-CM

## 2014-03-01 DIAGNOSIS — C3492 Malignant neoplasm of unspecified part of left bronchus or lung: Secondary | ICD-10-CM

## 2014-03-01 DIAGNOSIS — C3410 Malignant neoplasm of upper lobe, unspecified bronchus or lung: Secondary | ICD-10-CM

## 2014-03-01 DIAGNOSIS — Z5111 Encounter for antineoplastic chemotherapy: Secondary | ICD-10-CM

## 2014-03-01 DIAGNOSIS — C3490 Malignant neoplasm of unspecified part of unspecified bronchus or lung: Secondary | ICD-10-CM

## 2014-03-01 DIAGNOSIS — R0602 Shortness of breath: Secondary | ICD-10-CM

## 2014-03-01 LAB — CBC WITH DIFFERENTIAL/PLATELET
BASO%: 0.3 % (ref 0.0–2.0)
Basophils Absolute: 0 10*3/uL (ref 0.0–0.1)
EOS%: 0.5 % (ref 0.0–7.0)
Eosinophils Absolute: 0.1 10*3/uL (ref 0.0–0.5)
HCT: 37.9 % — ABNORMAL LOW (ref 38.4–49.9)
HGB: 12.2 g/dL — ABNORMAL LOW (ref 13.0–17.1)
LYMPH%: 13 % — ABNORMAL LOW (ref 14.0–49.0)
MCH: 32.1 pg (ref 27.2–33.4)
MCHC: 32.2 g/dL (ref 32.0–36.0)
MCV: 99.7 fL — AB (ref 79.3–98.0)
MONO#: 1 10*3/uL — ABNORMAL HIGH (ref 0.1–0.9)
MONO%: 9 % (ref 0.0–14.0)
NEUT#: 8.4 10*3/uL — ABNORMAL HIGH (ref 1.5–6.5)
NEUT%: 77.2 % — ABNORMAL HIGH (ref 39.0–75.0)
NRBC: 0 % (ref 0–0)
Platelets: 149 10*3/uL (ref 140–400)
RBC: 3.8 10*6/uL — AB (ref 4.20–5.82)
RDW: 15.6 % — AB (ref 11.0–14.6)
WBC: 10.8 10*3/uL — ABNORMAL HIGH (ref 4.0–10.3)
lymph#: 1.4 10*3/uL (ref 0.9–3.3)

## 2014-03-01 LAB — COMPREHENSIVE METABOLIC PANEL (CC13)
ALK PHOS: 106 U/L (ref 40–150)
ALT: 26 U/L (ref 0–55)
AST: 12 U/L (ref 5–34)
Albumin: 2.9 g/dL — ABNORMAL LOW (ref 3.5–5.0)
Anion Gap: 11 mEq/L (ref 3–11)
BILIRUBIN TOTAL: 0.31 mg/dL (ref 0.20–1.20)
BUN: 10.6 mg/dL (ref 7.0–26.0)
CO2: 26 mEq/L (ref 22–29)
Calcium: 9.3 mg/dL (ref 8.4–10.4)
Chloride: 104 mEq/L (ref 98–109)
Creatinine: 0.8 mg/dL (ref 0.7–1.3)
GLUCOSE: 170 mg/dL — AB (ref 70–140)
Potassium: 3.8 mEq/L (ref 3.5–5.1)
SODIUM: 141 meq/L (ref 136–145)
Total Protein: 6.3 g/dL — ABNORMAL LOW (ref 6.4–8.3)

## 2014-03-01 MED ORDER — DEXAMETHASONE SODIUM PHOSPHATE 20 MG/5ML IJ SOLN
20.0000 mg | Freq: Once | INTRAMUSCULAR | Status: AC
  Administered 2014-03-01: 20 mg via INTRAVENOUS

## 2014-03-01 MED ORDER — ONDANSETRON 16 MG/50ML IVPB (CHCC)
16.0000 mg | Freq: Once | INTRAVENOUS | Status: AC
  Administered 2014-03-01: 16 mg via INTRAVENOUS

## 2014-03-01 MED ORDER — FAMOTIDINE IN NACL 20-0.9 MG/50ML-% IV SOLN
INTRAVENOUS | Status: AC
  Filled 2014-03-01: qty 50

## 2014-03-01 MED ORDER — ALBUTEROL SULFATE (2.5 MG/3ML) 0.083% IN NEBU
2.5000 mg | INHALATION_SOLUTION | Freq: Once | RESPIRATORY_TRACT | Status: AC | PRN
  Administered 2014-03-01: 2.5 mg via RESPIRATORY_TRACT
  Filled 2014-03-01: qty 3

## 2014-03-01 MED ORDER — ONDANSETRON 16 MG/50ML IVPB (CHCC)
INTRAVENOUS | Status: AC
  Filled 2014-03-01: qty 16

## 2014-03-01 MED ORDER — METHYLPREDNISOLONE SODIUM SUCC 125 MG IJ SOLR
125.0000 mg | Freq: Once | INTRAMUSCULAR | Status: AC | PRN
  Administered 2014-03-01: 125 mg via INTRAVENOUS

## 2014-03-01 MED ORDER — FAMOTIDINE IN NACL 20-0.9 MG/50ML-% IV SOLN
20.0000 mg | Freq: Once | INTRAVENOUS | Status: AC
  Administered 2014-03-01: 20 mg via INTRAVENOUS

## 2014-03-01 MED ORDER — SODIUM CHLORIDE 0.9 % IV SOLN
Freq: Once | INTRAVENOUS | Status: AC | PRN
  Administered 2014-03-01: 16:00:00 via INTRAVENOUS

## 2014-03-01 MED ORDER — SODIUM CHLORIDE 0.9 % IV SOLN
Freq: Once | INTRAVENOUS | Status: AC
  Administered 2014-03-01: 11:00:00 via INTRAVENOUS

## 2014-03-01 MED ORDER — PACLITAXEL CHEMO INJECTION 300 MG/50ML
175.0000 mg/m2 | Freq: Once | INTRAVENOUS | Status: AC
  Administered 2014-03-01: 348 mg via INTRAVENOUS
  Filled 2014-03-01: qty 58

## 2014-03-01 MED ORDER — DIPHENHYDRAMINE HCL 50 MG/ML IJ SOLN
25.0000 mg | Freq: Once | INTRAMUSCULAR | Status: AC | PRN
  Administered 2014-03-01: 25 mg via INTRAVENOUS

## 2014-03-01 MED ORDER — DIPHENHYDRAMINE HCL 50 MG/ML IJ SOLN
50.0000 mg | Freq: Once | INTRAMUSCULAR | Status: AC
  Administered 2014-03-01: 50 mg via INTRAVENOUS

## 2014-03-01 MED ORDER — DIPHENHYDRAMINE HCL 50 MG/ML IJ SOLN
INTRAMUSCULAR | Status: AC
  Filled 2014-03-01: qty 1

## 2014-03-01 MED ORDER — CARBOPLATIN CHEMO INJECTION 600 MG/60ML
720.0000 mg | Freq: Once | INTRAVENOUS | Status: AC
  Administered 2014-03-01: 720 mg via INTRAVENOUS
  Filled 2014-03-01: qty 72

## 2014-03-01 MED ORDER — DEXAMETHASONE SODIUM PHOSPHATE 20 MG/5ML IJ SOLN
INTRAMUSCULAR | Status: AC
  Filled 2014-03-01: qty 5

## 2014-03-01 NOTE — Patient Instructions (Signed)
[facility] Discharge Instructions for Patients Receiving Chemotherapy  Today you received the following chemotherapy agents Taxol/Carboplatin To help prevent nausea and vomiting after your treatment, we encourage you to take your nausea medication as prescribed.  If you develop nausea and vomiting that is not controlled by your nausea medication, call the clinic.   BELOW ARE SYMPTOMS THAT SHOULD BE REPORTED IMMEDIATELY:  *FEVER GREATER THAN 100.5 F  *CHILLS WITH OR WITHOUT FEVER  NAUSEA AND VOMITING THAT IS NOT CONTROLLED WITH YOUR NAUSEA MEDICATION  *UNUSUAL SHORTNESS OF BREATH  *UNUSUAL BRUISING OR BLEEDING  TENDERNESS IN MOUTH AND THROAT WITH OR WITHOUT PRESENCE OF ULCERS  *URINARY PROBLEMS  *BOWEL PROBLEMS  UNUSUAL RASH Items with * indicate a potential emergency and should be followed up as soon as possible.  Feel free to call the clinic you have any questions or concerns. The clinic phone number is [phone].    

## 2014-03-01 NOTE — Progress Notes (Signed)
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  PRIOR THERAPY: Concurrent chemoradiation with weekly carboplatin for AUC of 2 and paclitaxel 45 mg/M2, status post 7 cycles, last dose was given 12/20/2013. First dose on 11/01/2013.    CURRENT THERAPY: consolidation chemotherapy with carboplatin for AUC of 5 and paclitaxel 175 mg/M2 every 3 weeks with Neulasta support. First cycle on 02/08/2014.  CHEMOTHERAPY INTENT: Curative/control  CURRENT # OF CHEMOTHERAPY CYCLES: 2  CURRENT ANTIEMETICS: Zofran, dexamethasone and Compazine  CURRENT SMOKING STATUS: Former smoker  ORAL CHEMOTHERAPY AND CONSENT: None  CURRENT BISPHOSPHONATES USE: None  PAIN MANAGEMENT: 0/10  NARCOTICS INDUCED CONSTIPATION: None.  LIVING WILL AND CODE STATUS: Full code.   INTERVAL HISTORY: Ernest [name] 58 y.o. male returns to the clinic today for followup visit accompanied by his wife. The patient is feeling fine today with no specific complaints except for shortness breath with exertion. He was recently admitted to [facility] for treatment of pneumonia. He recovered very well. He tolerated the last cycle of his systemic chemotherapy with carboplatin and paclitaxel fairly well except for mild peripheral neuropathy on the right lateral 3 fingers.  He is gaining weight. The patient denied having any significant chest pain, shortness of breath except with exertion, cough or hemoptysis. He has no nausea or vomiting. He denied having any significant fever or chills.  MEDICAL HISTORY: Past Medical History  Diagnosis Date  . CAD (coronary artery disease)     Left Main 30% stenosis, LAD 20  - 30 % stenosis, first and second diagonal branchesat 40 - 50%  stenosis with small arteries, circumflex had 30% stenosis in the large obtuse marginal, RCA at 70 - 80%  stenosis [not felt to be occlusive after evaluation with flow wire], distal 50 - 60% stenosis - [name][  . COPD (chronic obstructive pulmonary disease)     Dr. Baird Lyons  . Depression   . Anxiety   . Hyperlipidemia   . Chronic insomnia   . Gout   . GERD (gastroesophageal reflux disease)   . PVD (peripheral vascular disease)     (ABI 0.9 on right and 0.89 on left)  severe left external iliac stenosis.  He  had successful stenting of his left external iliac per Dr. Burt Knack.  . DDD (degenerative disc disease)   . Hx of colonoscopy   . 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 Cancer     lung  . OSA (obstructive sleep apnea)     NPSG 09/10/10- AHI 11.3/hr  . Hypertension     dr Percival Spanish  . Lung cancer 10/04/13    LUL squamous cell lung cancer  . History of radiation therapy 11/10/13- 12/29/13    left lung 6600 cGy in 33 sessions    ALLERGIES:  has No Known Allergies.  MEDICATIONS:  Current Outpatient Prescriptions  Medication Sig Dispense Refill  . albuterol (PROVENTIL HFA;VENTOLIN HFA) 108 (90 BASE) MCG/ACT inhaler Inhale 2 puffs into the lungs every 4 (four) hours as needed for wheezing or shortness of breath.      [name] albuterol (PROVENTIL) (2.5 MG/3ML) 0.083% nebulizer solution Take 3 mLs (2.5 mg total) by nebulization every 4 (four) hours as needed for wheezing or shortness  of breath.  150 mL  0  . ARIPiprazole (ABILIFY) 5 MG tablet Take 1 tablet (5 mg total) by mouth at bedtime.      . Armodafinil (NUVIGIL) 150 MG tablet Take 150 mg by mouth daily.      [name] atorvastatin (LIPITOR) 20 MG tablet Take 1 tablet (20 mg total) by mouth daily.  90 tablet  3  . clonazePAM (KLONOPIN) 1 MG tablet Take 1 mg by mouth at bedtime as needed. For anxiety/insomnia      . esomeprazole (NEXIUM) 40 MG capsule Take 1 capsule (40 mg total)  by mouth daily before breakfast.  90 capsule  3  . FLUoxetine (PROZAC) 40 MG capsule Take 40 mg by mouth daily.        . Fluticasone-Salmeterol (ADVAIR DISKUS) 250-50 MCG/DOSE AEPB Inhale 1 puff into the lungs 2 (two) times daily.  1 each  3  . gabapentin (NEURONTIN) 100 MG capsule Take 100 mg by mouth daily.       . Indacaterol Maleate (ARCAPTA NEOHALER) 75 MCG CAPS Place 1 capsule into inhaler and inhale daily.      . isosorbide mononitrate (IMDUR) 30 MG 24 hr tablet Take 1 tablet (30 mg total) by mouth daily.  30 tablet  3  . methocarbamol (ROBAXIN) 500 MG tablet Take 500 mg by mouth 2 (two) times daily as needed for muscle spasms.       . metoprolol succinate (TOPROL-XL) 12.5 mg TB24 24 hr tablet Take 0.5 tablets (12.5 mg total) by mouth daily.  60 tablet  0  . mirtazapine (REMERON) 15 MG tablet Take 22.5 mg by mouth at bedtime.      [name] morphine (MS CONTIN) 60 MG 12 hr tablet Take 60 mg by mouth 2 (two) times daily.        . nitroGLYCERIN (NITROSTAT) 0.4 MG SL tablet Place 1 tablet (0.4 mg total) under the tongue every 5 (five) minutes as needed for chest pain.  25 tablet  3  . Omega-3 Fatty Acids (FISH OIL CONCENTRATE) 1000 MG CAPS Take 1,000 mg by mouth daily.       . potassium chloride SA (K-DUR,KLOR-CON) 20 MEQ tablet Take 20 mEq by mouth daily.      . VOLTAREN 1 % GEL Apply 2 g topically daily as needed (for pain).        No current facility-administered medications for this visit.    SURGICAL HISTORY:  Past Surgical History  Procedure Laterality Date  . Spinal fusion  03/05/2007    L4-L5  . Hip surgery      left  . Arm surgery      left  . Shoulder surgery      right  . C-spine surgery    . Angioplasty    . Video bronchoscopy with endobronchial navigation N/A 10/04/2013    Procedure: VIDEO BRONCHOSCOPY WITH ENDOBRONCHIAL NAVIGATION;  Surgeon: Collene Gobble, MD;  Location: [place];  Service: Thoracic;  Laterality: N/A;  . Back surgery      REVIEW OF SYSTEMS:  Constitutional:  negative Eyes: negative Ears, nose, mouth, throat, and face: negative Respiratory: positive for dyspnea on exertion Cardiovascular: negative Gastrointestinal: negative Genitourinary:negative Integument/breast: negative Hematologic/lymphatic: negative Musculoskeletal:negative Neurological: negative Behavioral/Psych: negative Endocrine: negative Allergic/Immunologic: negative   PHYSICAL EXAMINATION: General appearance: alert, cooperative and no distress Head: Normocephalic, without obvious abnormality, atraumatic Neck: no adenopathy, no JVD, supple, symmetrical, trachea midline and thyroid not enlarged, symmetric, no tenderness/mass/nodules Lymph nodes: Cervical, supraclavicular, and axillary nodes normal.  Resp: clear to auscultation bilaterally Back: symmetric, no curvature. ROM normal. No CVA tenderness. Cardio: regular rate and rhythm, S1, S2 normal, no murmur, click, rub or gallop GI: soft, non-tender; bowel sounds normal; no masses,  no organomegaly Extremities: extremities normal, atraumatic, no cyanosis or edema Neurologic: Alert and oriented X 3, normal strength and tone. Normal symmetric reflexes. Normal coordination and gait  ECOG PERFORMANCE STATUS: 0 - Asymptomatic  Blood pressure 136/54, pulse 69, temperature 98.4 F (36.9 C), temperature source Oral, resp. rate 18, height 5\' 7"  (1.702 m), weight 187 lb 3.2 oz (84.913 kg).  LABORATORY DATA: Lab Results  Component Value Date   WBC 10.8* 03/01/2014   HGB 12.2* 03/01/2014   HCT 37.9* 03/01/2014   MCV 99.7* 03/01/2014   PLT 149 03/01/2014      Chemistry      Component Value Date/Time   NA 140 02/22/2014 0902   NA 135* 02/16/2014 0330   K 3.6 02/22/2014 0902   K 4.2 02/16/2014 0330   CL 99 02/16/2014 0330   CO2 30* 02/22/2014 0902   CO2 23 02/16/2014 0330   BUN 12.5 02/22/2014 0902   BUN 14 02/16/2014 0330   CREATININE 0.8 02/22/2014 0902   CREATININE 0.80 02/16/2014 0330      Component Value Date/Time   CALCIUM 9.2  02/22/2014 0902   CALCIUM 9.1 02/16/2014 0330   ALKPHOS 114 02/22/2014 0902   ALKPHOS 132* 02/15/2014 0510   AST 12 02/22/2014 0902   AST 15 02/15/2014 0510   ALT 40 02/22/2014 0902   ALT 17 02/15/2014 0510   BILITOT 0.29 02/22/2014 0902   BILITOT 0.6 02/15/2014 0510       RADIOGRAPHIC STUDIES:  ASSESSMENT AND PLAN: This is a very pleasant 58 years old white male with stage IIIA non-small cell lung cancer, squamous cell carcinoma currently undergoing concurrent chemoradiation with weekly carboplatin and paclitaxel status post 7 weeks of treatment. He tolerated his treatment fairly well with no significant adverse effects. He is currently undergoing consolidation chemotherapy with carboplatin and paclitaxel status post 1 cycle. He tolerated the first cycle fairly well except for an episode of pneumonia that required hospitalization. The patient is feeling much better today We will proceed with cycle #2 today as scheduled. He would come back for followup visit in 4 weeks with the start of cycle #3. He was advised to call immediately if he has any concerning symptoms in the interval. The patient voices understanding of current disease status and treatment options and is in agreement with the current care plan.  All questions were answered. The patient knows to call the clinic with any problems, questions or concerns. We can certainly see the patient much sooner if necessary.  Disclaimer: This note was dictated with voice recognition software. Similar sounding words can inadvertently be transcribed and may not be corrected upon review.

## 2014-03-01 NOTE — Progress Notes (Signed)
1535 Patient IV dc'ed walking out of clinic when he stopped to talk to this RN, patient's eyes noted to be red, and swollen. Patient complains of hands being "swollen" and difficulty taking a deep breath in along with chest pain. Patient seated, new IV started in left hand, normal saline wide open, [name] notified. 125 mg Solu-medrol and 25 mg Benadryl given along with Albuterol nebulizer.  63 [name] chairside, patient to remain in infusion room for 30 minutes for observation.  1545 Patient states his breathing is much improved and the chest tightness is better. Patient states he feels better.

## 2014-03-01 NOTE — Telephone Encounter (Signed)
gv and printed aptps ched adn avs for pt for April.Ernest [name]

## 2014-03-01 NOTE — Progress Notes (Signed)
Pt states he feels fine-back to normal.  No rash seen, breathing without difficulty.  D/c to home.  Pt has nausea & pain meds at home.

## 2014-03-02 ENCOUNTER — Ambulatory Visit (INDEPENDENT_AMBULATORY_CARE_PROVIDER_SITE_OTHER): Payer: Commercial Managed Care - HMO | Admitting: Internal Medicine

## 2014-03-02 ENCOUNTER — Encounter: Payer: Self-pay | Admitting: Internal Medicine

## 2014-03-02 ENCOUNTER — Ambulatory Visit (HOSPITAL_BASED_OUTPATIENT_CLINIC_OR_DEPARTMENT_OTHER): Payer: Commercial Managed Care - HMO

## 2014-03-02 VITALS — BP 116/72 | HR 72 | Ht 66.0 in | Wt 191.2 lb

## 2014-03-02 VITALS — BP 146/60 | HR 81 | Temp 98.5°F

## 2014-03-02 DIAGNOSIS — C3490 Malignant neoplasm of unspecified part of unspecified bronchus or lung: Secondary | ICD-10-CM

## 2014-03-02 DIAGNOSIS — J449 Chronic obstructive pulmonary disease, unspecified: Secondary | ICD-10-CM

## 2014-03-02 DIAGNOSIS — J4489 Other specified chronic obstructive pulmonary disease: Secondary | ICD-10-CM

## 2014-03-02 DIAGNOSIS — F17200 Nicotine dependence, unspecified, uncomplicated: Secondary | ICD-10-CM

## 2014-03-02 DIAGNOSIS — Z5189 Encounter for other specified aftercare: Secondary | ICD-10-CM

## 2014-03-02 DIAGNOSIS — J441 Chronic obstructive pulmonary disease with (acute) exacerbation: Secondary | ICD-10-CM

## 2014-03-02 DIAGNOSIS — G4733 Obstructive sleep apnea (adult) (pediatric): Secondary | ICD-10-CM

## 2014-03-02 DIAGNOSIS — C3410 Malignant neoplasm of upper lobe, unspecified bronchus or lung: Secondary | ICD-10-CM

## 2014-03-02 MED ORDER — THEOPHYLLINE ER 200 MG PO TB12: 200.0000 mg | ORAL_TABLET | Freq: Two times a day (BID) | ORAL | Status: DC

## 2014-03-02 MED ORDER — PEGFILGRASTIM INJECTION 6 MG/0.6ML
6.0000 mg | Freq: Once | SUBCUTANEOUS | Status: AC
  Administered 2014-03-02: 6 mg via SUBCUTANEOUS
  Filled 2014-03-02: qty 0.6

## 2014-03-02 NOTE — Progress Notes (Signed)
Patient ID: Ernest [name], male    DOB: 03-Mar-1956, 58 y.o.   MRN: [id number]  HPI 07/30/11- 40 yoM smoker followed for COPD/ bronchitis, OSA/ CPAP Last here 04/01/11- note reviewed. He had felt better on a trial of maintenance prednisone. He has felt more short of breath through the hot summer, with no acute event and without blood, chest pain or fever. Easy DOE around home. Has been sharing nebulizer with his wife, who now has a cold and we discussed this. Has been worse in last month, and expecially in recent rainy weather. Noting a little ankle swelling. Smothered trying to take a shower.  Only tolerating his CPAP about 3 nights/ week because he feels smothered, waking sore in anterior chest in the mornings.  Now down to only 2-3 cigarettes/ day and trying to stop.  CXR 08/14/10- Nl NAD PFT 05/26/08- FEV1/FVC 2.44/ 0.76, incr 19% w/ BD, R 0.59 ?DLCO 0.69  09/10/11- 55 yoM smoker followed for COPD/ bronchitis, OSA/ CPAP He reports feeling better "finally doing okay". He is down to just a couple of cigarettes a day. He is using CPAP regularly now and is comfortable with it. Alpha I antitrypsin Report came back normal "M. M." on 08/09/2011. Chest x-ray from 08/15/2011 showed stable changes of COPD. He has had flu vaccine.  11/12/11- 46 yoM smoker followed for COPD/ bronchitis, OSA/ CPAP Here with wife. Has had flu shot. He says he was doing well until 2 weeks ago. He again is coughing thick mucus which is difficult to clear. He depends on his metered inhaler and nebulizer to help clear his airways. Started Mucinex 3 days ago. Mucus is white. He denies fever, sore throat, chest pain, GI upset. Because of orthopnea he is sitting up to sleep for the past 2 nights.  01/14/12- 55 yoM smoker followed for COPD/ bronchitis, OSA/ CPAP Increase congested cough green to yellow sputum but he does not feel sick and denies fever. Sister who was a smoker recently died of lung cancer. Despite that and all of our  discussions, he still smokes a few cigarettes daily. We talked again about motivation to stop completely. Continues CPAP and Provigil one or 2 daily.  03/10/12  56 yoM smoker followed for COPD/ bronchitis, OSA/ CPAP Persistent bronchitic cough. Sputum is not purulent. He is "working on" his smoking habit but blames pollen for keeping him S. coughing now. Remains fully compliant with CPAP, all night every night. Still needs Provigil to help with residual daytime sleepiness as before.  05/12/12- 56 yoM smoker followed for COPD/ bronchitis, OSA/ CPAP Stays SOB all the time-even at rest; wheezing, coughing-productive-yellow in color, chest congestion, nasal congestion,. He is more interested today in smoking cessation which we discussed. He had quit for Daliresp after 10 days because of sustained nausea. He is taking all of his regular medicines and using his rescue inhaler several times a day. Off prednisone now for 1-2 months. CXR 01/23/12-  IMPRESSION:  No acute cardiopulmonary abnormality.  Original Report Authenticated By: Randall An, M.D.   07/13/12- 45 yoM smoker followed for COPD/ bronchitis, OSA/ CPAP, complicated by CAD Pt states increased sob,wheezing,productive cough x3 months. Still smoking with no serious effort to stop. His sister has lung cancer which has gotten his attention and he may be willing to try harder. Some days his breathing is better than others with no real trend. Coughing productive of white sputum with no blood. He has all of his medications currently.  07/30/12- 10 yoM  smoker followed for COPD/ bronchitis, OSA/ CPAP, complicated by CAD Cough-productive-yellow and thick; SOB, wheezing, chest congestion, head congestion x 1 week; O2 sat of 83% RA entered room-20 seconds RA sat of 90% CT 07/21/12-  IMPRESSION:  1. Tiny bilateral lung nodules which are similar to on the prior  exam, given differences in slice selection and measurement error.  Likely subpleural lymph  nodes.  2. Age advanced coronary artery atherosclerosis. Recommend  assessment of coronary risk factors and consideration of medical  therapy.  3. Slight enlargement of splenic lesion since 2007. Favored to  represent a hemangioma or lymphangioma.  Original Report Authenticated By: Areta Haber, M.D.   08/07/12- 22 yoM smoker followed for COPD/ bronchitis, OSA/ CPAP, complicated by CAD, disability for back pain. Cough-productive-white mostly; SOB , wheezing-worse at night     wife here Coughing spasms he can feel his upper airway is closing off. He he still smokes a little even when he feels badly and we went over this again today offering support.  11/13/12- 56 yoM smoker followed for COPD/ bronchitis, OSA/ CPAP, complicated by CAD, disability for back pain. FOLLOWS NID:POEUM still but not productive-more SOB  Went to ER in September or acute exacerbation of COPD but not admitted. Today he is a scheduled visit. He actually says he is doing pretty well with no acute problems. Perennial nasal congestion. He has reduced his cigarette smoking to about 2 per day and is strongly encouraged to stop completely now.  01/25/13-  57 yoM smoker followed for COPD/ bronchitis, OSA/ CPAP, complicated by CAD, disability for back pain. ACUTE VISIT: started smoking agian-discuss taking Chantix; feels raw in chest area with deep cough x 2 weeks. He feels well at this visit, meaning he is at his baseline some daily nonproductive cough and shortness of breath with exertion.  05/17/13-57 yoM smoker followed for COPD/ bronchitis, OSA/ CPAP, complicated by CAD, disability for back pain. FOLLOWS PNT:[id number] is about the same as last visit; has lessened the about the amount he is smoking. Chantix helps. Continues CPAP/ 10/[facility] Discussed smoking cessation effort.  06/10/13- 57 yoM smoker followed for COPD/ bronchitis, OSA/ CPAP, complicated by CAD, disability for back pain. ACUTE VISIT: prod cough with yellow/green  mucus, increased SOB, wheezing, tightness in chest, chills x1 week.  still on augmentin and pred taper from 7/15 phone note > no better Trying Chantix. Wife is also sick. There exposed to his brother's child who has frequent colds. Just started prednisone taper and Augmentin yesterday. Had teeth pulled.  09/16/13- 57 yoM smoker followed for COPD/ bronchitis, OSA/ CPAP, complicated by CAD, disability for back pain. FOLLOWS FOR: was given the flu shot about 1 month and has not felt good since; continues to keep a headache, chest congestion at night. Describes frontal headache, head and chest congestion since a flu shot. Using Chantix, has reduced smoking to 2 or 3 cigarettes daily. Denies infection-no fever, sputum not purulent or bloody. No chest pain. Stays on Mucinex. Not using CPAP because of head congestion. Sister has died of lung cancer/smoker.  10-23-13- 57 yoM smoker followed for COPD/ bronchitis, OSA/ CPAP, SqCell CA LUL,complicated by CAD, disability for back pain. FOLLOWS FOR: review bx results with patient; continues to have bad frontal lobe headache and lots of congestion. Nausea last night. No vomiting. Abnl CXR > CT chest/ large LUL mass to hilum. We had notified pt and scheduled bx. He is not coughing up much, denies chest pain. New headache mostly left frontal and  retro-orbital x 1 month with no lateralizing neuro. Remains tight on routine meds, off prednisone.  His wife blames "anxiety" for ER trip/ nebulizer the night before his bronchoscopy. Sister recently died of small cell Ca lung.  CT chest 09/17/13- IMPRESSION:  1. 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 This is associated with  some postobstructive changes in the left upper lobe as well as left  hilar and AP window lymphadenopathy. Based on these findings, and  assuming lack of distant metastatic disease (which will have to be  proven by other imaging  studies), this is favored to represent at  least T2b (if not T3 because of satellite nodules in the left upper  lobe), N2, Mx disease (i.e., at least stage IIIA disease). Further  evaluation with PET-CT and brain MRI with and without IV gadolinium  is suggested at this time for complete staging.  2. Mild centrilobular and paraseptal emphysema.  3. Atherosclerosis, including 3 vessel coronary artery disease.  Assessment for potential risk factor modification, dietary therapy  or pharmacologic therapy may be warranted, if clinically indicated.  4. Additional incidental findings, as above.  Electronically Signed  By: Vinnie Langton M.D.  On: 09/22/2013 11:34  Bronch / needle bx 10/04/13- by Dr Lamonte Sakai Pos Squamous Cell Ca  1/23//15- 57 yoM former smoker followed for COPD/ bronchitis, OSA/ CPAP, SqCell CA LUL/ XRT/Chemo, complicated by CAD, disability for back pain. Myocardial Perfusion 11/19/13- EF 65%, Nl wall motion, no ischemic changes. Follows for: frontal lobe headaches have gone away, SOB constantly, some prod cough with white/clear mucous.  Not smoking.  Nearing completion of current rounds of Chemo/ XRT.  Minor sore throat, breathing ok- some SOB, dry cough.  No recent steroids or ABX except Advair.  Continues CPAP 10/ [facility] with no problem.   01/06/13- 57 yoM former smoker followed for COPD/ bronchitis, OSA/ CPAP, SqCell CA LUL/ XRT/Chemo, complicated by CAD, disability for back pain. ACUTE VISIT:  Increase sob, wheezing and cough with congestion head and chest. Also, has bodyaches and sweats Acute illness x1 week started as head congestion moving to his chest with fever, white sputum turning yellow, myalgias. No blood. Had had flu shot. Has completed radiation and chemotherapy pending next oncology followup. Admits smoking 2 cigarettes since last here.  03/02/14- 21 yoM some-time smoker followed for COPD/ bronchitis, OSA/ CPAP, SqCell CA LUL/ XRT/Chemo, complicated by CAD, disability  for back pain. FOLLOWS FOR: Post hospital; Pt states that continues to have congestion, wheezing, and having left sided chest pain this morning. Had reaction to chemo yesterday-started having trouble breathing and hot flashes-was given breathing tx and injection. Hospitalized in with pneumonia, COPD with asthma 2 weeks ago. CT chest 01/28/2014 showed his left upper lobe mass to be cavitating. Yesterday he had reaction to chemotherapy requiring nebulizer treatment and injection. More dyspnea on exertion at baseline, gradually worse over the past month. Coughing clear phlegm. Nebulizer helps temporarily. This morning head left upper anterior lateral chest pain which is gradually fading. He admits he is smoking some-discussed. Continues CPAP [facility] CXR 02/15/14 IMPRESSION:  No acute findings and no change from the study performed earlier  this same day.  Electronically Signed  By: Lajean Manes M.D.  On: 02/15/2014 09:23   Review of Systems- see HPI Constitutional:   No-   weight loss, night sweats, +fevers, chills,+ fatigue, lassitude. HEENT:  No- headaches, difficulty swallowing, tooth/dental problems, sore throat,  No-  sneezing, itching, ear ache, nasal congestion, post nasal drip,  CV:  + atypical chest pain, orthopnea, PND,  Little swelling in lower extremities,  No-dizziness,                    palpitations Resp: +  shortness of breath with exertion or at rest.             + productive cough,  + non-productive cough,  No-  coughing up of blood.              No-change in color of mucus.  +some wheezing.   Skin: No-   rash or lesions. GI:  No-   heartburn, indigestion, abdominal pain, nausea, vomiting, GU:  MS:  No-   joint pain or swelling.   Neuro- nothing unusual  Psych:  No- change in mood or affect. No depression or anxiety.  No memory loss.    Objective:   Physical Exam General- Alert, Oriented, Affect-appropriate, Distress- none acute   Stocky/ overweight,  Skin-  rash-none, lesions- none, excoriation- none.      Lymphadenopathy- none Head- atraumatic            Eyes- Gross vision intact, +PERRLA, conjunctivae- injected. + peri-orbital edema            Ears- Hearing, canals- normal            Nose- +turbinate edema, No- Septal dev, +mucus bridging, no-polyps, erosion, perforation             Throat- Mallampati III , +tongue coated/ thrush, drainage- none, tonsils- atrophic,                     + hoarse Neck- flexible , trachea midline, no stridor , thyroid nl, carotid no bruit Chest - symmetrical excursion , unlabored           Heart/CV- RRR , no murmur , no gallop  , no rub, nl s1 s2                           - JVD- none , edema-none, stasis changes- none, varices- none           Lung-+ distant expiratory squeak at left scapula, unlabored,  Wheeze-none, no- cough ,                                thisdullness-none, rub- none           Chest wall-  Abd-  Br/ Gen/ Rectal- Not done, not indicated Extrem- cyanosis- none, clubbing, none, atrophy- none, strength- nl. 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 Neuro- grossly intact to observation

## 2014-03-02 NOTE — Patient Instructions (Addendum)
Order- walk test on room air for oximetry    Dx COPD, lung Ca  Order- DME Kerin Ransom on room air         Dx COPD, lung Ca  Script to try theophylline sent- 1 twice daily with meals

## 2014-03-04 ENCOUNTER — Telehealth: Payer: Self-pay | Admitting: Internal Medicine

## 2014-03-04 ENCOUNTER — Ambulatory Visit: Payer: Medicare HMO | Admitting: Internal Medicine

## 2014-03-04 MED ORDER — ALBUTEROL SULFATE (2.5 MG/3ML) 0.083% IN NEBU: 2.5000 mg | INHALATION_SOLUTION | RESPIRATORY_TRACT | Status: DC | PRN

## 2014-03-04 NOTE — Telephone Encounter (Signed)
Spouse aware RX has been sent

## 2014-03-07 ENCOUNTER — Ambulatory Visit (INDEPENDENT_AMBULATORY_CARE_PROVIDER_SITE_OTHER): Payer: Medicare HMO | Admitting: Internal Medicine

## 2014-03-07 ENCOUNTER — Encounter: Payer: Self-pay | Admitting: Internal Medicine

## 2014-03-07 VITALS — BP 118/74 | HR 88 | Temp 99.3°F | Ht 66.0 in | Wt 183.0 lb

## 2014-03-07 DIAGNOSIS — I10 Essential (primary) hypertension: Secondary | ICD-10-CM

## 2014-03-07 DIAGNOSIS — F3289 Other specified depressive episodes: Secondary | ICD-10-CM

## 2014-03-07 DIAGNOSIS — R739 Hyperglycemia, unspecified: Secondary | ICD-10-CM

## 2014-03-07 DIAGNOSIS — R7309 Other abnormal glucose: Secondary | ICD-10-CM

## 2014-03-07 DIAGNOSIS — C3490 Malignant neoplasm of unspecified part of unspecified bronchus or lung: Secondary | ICD-10-CM

## 2014-03-07 DIAGNOSIS — B370 Candidal stomatitis: Secondary | ICD-10-CM

## 2014-03-07 DIAGNOSIS — F329 Major depressive disorder, single episode, unspecified: Secondary | ICD-10-CM

## 2014-03-07 DIAGNOSIS — E7800 Pure hypercholesterolemia, unspecified: Secondary | ICD-10-CM

## 2014-03-07 MED ORDER — NYSTATIN 100000 UNIT/ML MT SUSP: 5.0000 mL | Freq: Four times a day (QID) | OROMUCOSAL | Status: DC

## 2014-03-07 NOTE — Assessment & Plan Note (Signed)
Patient at risk for polypharmacy especially in light of weight loss and treatment for squamous cell carcinoma with chemotherapy. Patient advised to followup with his psychiatrist to discuss streamlining his psychiatric medications.

## 2014-03-07 NOTE — Assessment & Plan Note (Signed)
Patient experiencing significant fatigue secondary to chemotherapy. He has lost weight and his albumin levels have fallen. Patient currently taking protein supplement.

## 2014-03-07 NOTE — Patient Instructions (Signed)
Increase your protein intake Check your blood sugar once daily fasting in AM

## 2014-03-07 NOTE — Assessment & Plan Note (Signed)
Patient experiencing significant hyperglycemia since he was treated during hospitalization with IV steroids. Patient provided with glucometer and instructions for proper use. Check A1c. Patient may need low-dose Lantus.

## 2014-03-07 NOTE — Assessment & Plan Note (Signed)
Patient experiencing symptoms consistent with oral thrush. Use nystatin suspension as directed.

## 2014-03-07 NOTE — Progress Notes (Signed)
Pre visit review using our clinic review tool, if applicable. No additional management support is needed unless otherwise documented below in the visit note. 

## 2014-03-07 NOTE — Progress Notes (Signed)
Subjective:    Patient ID: Ernest [name], male    DOB: February 19, 1956, 58 y.o.   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 Interval medical history-patient was admitted On March 2013 [facility] secondary to possible pneumonia and sepsis. Patient reports she was treated with IV antibiotics and steroids discontinued for 5 days later. His respiratory symptoms improved. He has chronic nonproductive cough. Denies shortness of breath. He complains of a sore throat. He has whitish film on tongue in the morning.  Chart review performed-patient experience hypoglycemia during hospitalization. He is not currently checking blood sugars at home.  Stage III squamous cell carcinoma - patient feeling tired from last cycle of chemotherapy. He has lost significant amount of weight.  History of depression - Patient has not seen a psychiatrist recently. Patient on multiple psychotropic medication.  Review of Systems Weight loss, poor appetite    Past Medical History  Diagnosis Date  . CAD (coronary artery disease)     Left Main 30% stenosis, LAD 20 - 30 % stenosis, first and second diagonal branchesat 40 - 50%  stenosis with small arteries, circumflex had 30% stenosis in the large obtuse marginal, RCA at 70 - 80%  stenosis [not felt to be occlusive after evaluation with flow wire], distal 50 - 60% stenosis - [name][  . COPD (chronic obstructive pulmonary disease)     Dr. Baird Lyons  . Depression   . Anxiety   . Hyperlipidemia   . Chronic insomnia   . Gout   . GERD (gastroesophageal reflux disease)   . PVD (peripheral vascular disease)     (ABI 0.9 on right and 0.89 on left)  severe left external iliac stenosis.  He  had successful stenting of his left external iliac per Dr. Burt Knack.  . DDD (degenerative disc disease)   . Hx of colonoscopy   . COPD with asthma  09/08/2007  . Cancer     lung  . OSA (obstructive sleep apnea)     NPSG 09/10/10- AHI 11.3/hr  . Hypertension     dr Percival Spanish  . Lung cancer 10/04/13    LUL squamous cell lung cancer  . History of radiation therapy 11/10/13- 12/29/13    left lung 6600 cGy in 33 sessions    History   Social History  . Marital Status: Married    Spouse Name: N/A    Number of Children: N/A  . Years of Education: N/A   Occupational History  . Disabled welder    Social History Main Topics  . Smoking status: Current Some Day Smoker -- 0.50 packs/day for 43 years    Types: Cigarettes    Last Attempt to Quit: 09/23/2013  . Smokeless tobacco: Never Used     Comment: history of 3 PPD, 11/02/13   . Alcohol Use: No  . Drug Use: No  . Sexual Activity: No   Other Topics Concern  . Not on file   Social History Narrative  . No narrative on file    Past Surgical History  Procedure Laterality Date  . Spinal fusion  03/05/2007    L4-L5  . Hip surgery      left  . Arm surgery      left  . Shoulder surgery      right  . C-spine surgery    . Angioplasty    . Video bronchoscopy  with endobronchial navigation N/A 10/04/2013    Procedure: VIDEO BRONCHOSCOPY WITH ENDOBRONCHIAL NAVIGATION;  Surgeon: Collene Gobble, MD;  Location: [facility];  Service: Thoracic;  Laterality: N/A;  . Back surgery      Family History  Problem Relation Age of Onset  . Heart attack Mother   . Heart attack Sister   . Lung cancer Sister   . Cancer Sister     small cell lung, mets to brain  . Emphysema Sister   . Hypertension Brother     No Known Allergies  Current Outpatient Prescriptions on File Prior to Visit  Medication Sig Dispense Refill  . albuterol (PROVENTIL HFA;VENTOLIN HFA) 108 (90 BASE) MCG/ACT inhaler Inhale 2 puffs into the lungs every 4 (four) hours as needed for wheezing or shortness of breath.      [name] albuterol (PROVENTIL) (2.5 MG/3ML) 0.083% nebulizer solution Take 3 mLs (2.5 mg total) by nebulization  every 4 (four) hours as needed for wheezing or shortness of breath.  360 mL  1  . ARIPiprazole (ABILIFY) 5 MG tablet Take 1 tablet (5 mg total) by mouth at bedtime.      . Armodafinil (NUVIGIL) 150 MG tablet Take 150 mg by mouth daily.      [name] atorvastatin (LIPITOR) 20 MG tablet Take 1 tablet (20 mg total) by mouth daily.  90 tablet  3  . clonazePAM (KLONOPIN) 1 MG tablet Take 1 mg by mouth at bedtime as needed. For anxiety/insomnia      . esomeprazole (NEXIUM) 40 MG capsule Take 1 capsule (40 mg total) by mouth daily before breakfast.  90 capsule  3  . FLUoxetine (PROZAC) 40 MG capsule Take 40 mg by mouth daily.        . Fluticasone-Salmeterol (ADVAIR DISKUS) 250-50 MCG/DOSE AEPB Inhale 1 puff into the lungs 2 (two) times daily.  1 each  3  . gabapentin (NEURONTIN) 100 MG capsule Take 100 mg by mouth daily.       . Indacaterol Maleate (ARCAPTA NEOHALER) 75 MCG CAPS Place 1 capsule into inhaler and inhale daily.      . isosorbide mononitrate (IMDUR) 30 MG 24 hr tablet Take 1 tablet (30 mg total) by mouth daily.  30 tablet  3  . methocarbamol (ROBAXIN) 500 MG tablet Take 500 mg by mouth 2 (two) times daily as needed for muscle spasms.       . metoprolol succinate (TOPROL-XL) 12.5 mg TB24 24 hr tablet Take 0.5 tablets (12.5 mg total) by mouth daily.  60 tablet  0  . mirtazapine (REMERON) 15 MG tablet Take 22.5 mg by mouth at bedtime.      [name] morphine (MS CONTIN) 60 MG 12 hr tablet Take 60 mg by mouth 2 (two) times daily.        . nitroGLYCERIN (NITROSTAT) 0.4 MG SL tablet Place 1 tablet (0.4 mg total) under the tongue every 5 (five) minutes as needed for chest pain.  25 tablet  3  . Omega-3 Fatty Acids (FISH OIL CONCENTRATE) 1000 MG CAPS Take 1,000 mg by mouth daily.       . potassium chloride SA (K-DUR,KLOR-CON) 20 MEQ tablet Take 20 mEq by mouth daily.      . theophylline (THEODUR) 200 MG 12 hr tablet Take 1 tablet (200 mg total) by mouth 2 (two) times daily.  60 tablet  prn  . VOLTAREN 1 % GEL  Apply 2 g topically daily as needed (for pain).  No current facility-administered medications on file prior to visit.    BP 118/74  Pulse 88  Temp(Src) 99.3 F (37.4 C) (Oral)  Ht 5\' 6"  (1.676 m)  Wt 183 lb (83.008 kg)  BMI 29.55 kg/m2    Objective:   Physical Exam  Constitutional: He is oriented to person, place, and time. He appears well-developed and well-nourished. No distress.  HENT:  Head: Normocephalic and atraumatic.  Oropharyngeal erythema  Neck: Neck supple.  Cardiovascular: Normal rate, regular rhythm and normal heart sounds.   Pulmonary/Chest: Effort normal and breath sounds normal.  Scattered faint expiratory wheezes  Musculoskeletal: He exhibits no edema.  Lymphadenopathy:    He has no cervical adenopathy.  Neurological: He is alert and oriented to person, place, and time. No cranial nerve deficit.  Skin: Skin is warm and dry.  Psychiatric: He has a normal mood and affect. His behavior is normal.          Assessment & Plan:

## 2014-03-08 ENCOUNTER — Other Ambulatory Visit (HOSPITAL_BASED_OUTPATIENT_CLINIC_OR_DEPARTMENT_OTHER): Payer: Medicare HMO

## 2014-03-08 ENCOUNTER — Telehealth: Payer: Self-pay | Admitting: Internal Medicine

## 2014-03-08 DIAGNOSIS — C3492 Malignant neoplasm of unspecified part of left bronchus or lung: Secondary | ICD-10-CM

## 2014-03-08 DIAGNOSIS — C3410 Malignant neoplasm of upper lobe, unspecified bronchus or lung: Secondary | ICD-10-CM

## 2014-03-08 LAB — COMPREHENSIVE METABOLIC PANEL (CC13)
ALK PHOS: 124 U/L (ref 40–150)
ALT: 18 U/L (ref 0–55)
AST: 12 U/L (ref 5–34)
Albumin: 3 g/dL — ABNORMAL LOW (ref 3.5–5.0)
Anion Gap: 11 mEq/L (ref 3–11)
BILIRUBIN TOTAL: 0.28 mg/dL (ref 0.20–1.20)
BUN: 9.2 mg/dL (ref 7.0–26.0)
CO2: 27 mEq/L (ref 22–29)
Calcium: 9.2 mg/dL (ref 8.4–10.4)
Chloride: 101 mEq/L (ref 98–109)
Creatinine: 0.8 mg/dL (ref 0.7–1.3)
Glucose: 163 mg/dl — ABNORMAL HIGH (ref 70–140)
Potassium: 3.5 mEq/L (ref 3.5–5.1)
Sodium: 139 mEq/L (ref 136–145)
Total Protein: 6.3 g/dL — ABNORMAL LOW (ref 6.4–8.3)

## 2014-03-08 LAB — HEMOGLOBIN A1C: Hgb A1c MFr Bld: 7 % — ABNORMAL HIGH (ref 4.6–6.5)

## 2014-03-08 LAB — CBC WITH DIFFERENTIAL/PLATELET
BASO%: 0.7 % (ref 0.0–2.0)
BASOS ABS: 0 10*3/uL (ref 0.0–0.1)
EOS ABS: 0.1 10*3/uL (ref 0.0–0.5)
EOS%: 1.3 % (ref 0.0–7.0)
HCT: 34.4 % — ABNORMAL LOW (ref 38.4–49.9)
HEMOGLOBIN: 11.2 g/dL — AB (ref 13.0–17.1)
LYMPH%: 11.1 % — AB (ref 14.0–49.0)
MCH: 31.7 pg (ref 27.2–33.4)
MCHC: 32.6 g/dL (ref 32.0–36.0)
MCV: 97.3 fL (ref 79.3–98.0)
MONO#: 0.5 10*3/uL (ref 0.1–0.9)
MONO%: 11.2 % (ref 0.0–14.0)
NEUT%: 75.7 % — ABNORMAL HIGH (ref 39.0–75.0)
NEUTROS ABS: 3.6 10*3/uL (ref 1.5–6.5)
Platelets: 81 10*3/uL — ABNORMAL LOW (ref 140–400)
RBC: 3.54 10*6/uL — ABNORMAL LOW (ref 4.20–5.82)
RDW: 15.3 % — ABNORMAL HIGH (ref 11.0–14.6)
WBC: 4.8 10*3/uL (ref 4.0–10.3)
lymph#: 0.5 10*3/uL — ABNORMAL LOW (ref 0.9–3.3)

## 2014-03-08 LAB — BASIC METABOLIC PANEL
BUN: 11 mg/dL (ref 6–23)
CHLORIDE: 97 meq/L (ref 96–112)
CO2: 24 mEq/L (ref 19–32)
CREATININE: 0.8 mg/dL (ref 0.4–1.5)
Calcium: 8.9 mg/dL (ref 8.4–10.5)
GFR: 111.5 mL/min (ref >60.00)
GLUCOSE: 141 mg/dL — AB (ref 70–99)
POTASSIUM: 3.7 meq/L (ref 3.5–5.1)
Sodium: 134 mEq/L — ABNORMAL LOW (ref 135–145)

## 2014-03-08 LAB — CK: Total CK: 58 U/L (ref 7–232)

## 2014-03-08 NOTE — Telephone Encounter (Signed)
Relevant patient education assigned to patient using Emmi. ° °

## 2014-03-15 ENCOUNTER — Other Ambulatory Visit (HOSPITAL_BASED_OUTPATIENT_CLINIC_OR_DEPARTMENT_OTHER): Payer: Commercial Managed Care - HMO

## 2014-03-15 DIAGNOSIS — C3410 Malignant neoplasm of upper lobe, unspecified bronchus or lung: Secondary | ICD-10-CM

## 2014-03-15 DIAGNOSIS — C3492 Malignant neoplasm of unspecified part of left bronchus or lung: Secondary | ICD-10-CM

## 2014-03-15 LAB — COMPREHENSIVE METABOLIC PANEL (CC13)
ALBUMIN: 2.9 g/dL — AB (ref 3.5–5.0)
ALT: 14 U/L (ref 0–55)
AST: 14 U/L (ref 5–34)
Alkaline Phosphatase: 112 U/L (ref 40–150)
Anion Gap: 11 mEq/L (ref 3–11)
BILIRUBIN TOTAL: 0.48 mg/dL (ref 0.20–1.20)
BUN: 6.6 mg/dL — ABNORMAL LOW (ref 7.0–26.0)
CO2: 27 meq/L (ref 22–29)
Calcium: 9.6 mg/dL (ref 8.4–10.4)
Chloride: 103 mEq/L (ref 98–109)
Creatinine: 0.8 mg/dL (ref 0.7–1.3)
GLUCOSE: 170 mg/dL — AB (ref 70–140)
Potassium: 3.5 mEq/L (ref 3.5–5.1)
SODIUM: 142 meq/L (ref 136–145)
TOTAL PROTEIN: 6.5 g/dL (ref 6.4–8.3)

## 2014-03-15 LAB — CBC WITH DIFFERENTIAL/PLATELET
BASO%: 0.3 % (ref 0.0–2.0)
Basophils Absolute: 0 10*3/uL (ref 0.0–0.1)
EOS%: 0.3 % (ref 0.0–7.0)
Eosinophils Absolute: 0 10*3/uL (ref 0.0–0.5)
HEMATOCRIT: 32.8 % — AB (ref 38.4–49.9)
HGB: 10.5 g/dL — ABNORMAL LOW (ref 13.0–17.1)
LYMPH%: 11.8 % — AB (ref 14.0–49.0)
MCH: 31.3 pg (ref 27.2–33.4)
MCHC: 32 g/dL (ref 32.0–36.0)
MCV: 97.6 fL (ref 79.3–98.0)
MONO#: 0.5 10*3/uL (ref 0.1–0.9)
MONO%: 7.1 % (ref 0.0–14.0)
NEUT%: 80.5 % — ABNORMAL HIGH (ref 39.0–75.0)
NEUTROS ABS: 5.9 10*3/uL (ref 1.5–6.5)
PLATELETS: 96 10*3/uL — AB (ref 140–400)
RBC: 3.36 10*6/uL — AB (ref 4.20–5.82)
RDW: 15.6 % — ABNORMAL HIGH (ref 11.0–14.6)
WBC: 7.3 10*3/uL (ref 4.0–10.3)
lymph#: 0.9 10*3/uL (ref 0.9–3.3)

## 2014-03-17 ENCOUNTER — Telehealth: Payer: Self-pay | Admitting: Internal Medicine

## 2014-03-17 DIAGNOSIS — J449 Chronic obstructive pulmonary disease, unspecified: Secondary | ICD-10-CM

## 2014-03-17 DIAGNOSIS — C3490 Malignant neoplasm of unspecified part of unspecified bronchus or lung: Secondary | ICD-10-CM

## 2014-03-17 NOTE — Telephone Encounter (Signed)
Order placed to [facility].

## 2014-03-18 MED ORDER — INSULIN GLARGINE 100 UNIT/ML SOLOSTAR PEN: PEN_INJECTOR | SUBCUTANEOUS | Status: DC

## 2014-03-18 MED ORDER — ACCU-CHEK SOFTCLIX LANCETS MISC: Status: DC

## 2014-03-18 MED ORDER — GLUCOSE BLOOD VI STRP: ORAL_STRIP | Status: DC

## 2014-03-18 NOTE — Addendum Note (Signed)
Addended by: Colleen Can on: 03/18/2014 09:10 AM   Modules accepted: Orders

## 2014-03-22 ENCOUNTER — Telehealth: Payer: Self-pay | Admitting: Internal Medicine

## 2014-03-22 ENCOUNTER — Other Ambulatory Visit (HOSPITAL_BASED_OUTPATIENT_CLINIC_OR_DEPARTMENT_OTHER): Payer: Commercial Managed Care - HMO

## 2014-03-22 ENCOUNTER — Other Ambulatory Visit: Payer: Medicare HMO

## 2014-03-22 ENCOUNTER — Ambulatory Visit (HOSPITAL_BASED_OUTPATIENT_CLINIC_OR_DEPARTMENT_OTHER): Payer: Commercial Managed Care - HMO | Admitting: Internal Medicine

## 2014-03-22 ENCOUNTER — Ambulatory Visit: Payer: Medicare HMO

## 2014-03-22 ENCOUNTER — Encounter: Payer: Self-pay | Admitting: Internal Medicine

## 2014-03-22 VITALS — BP 135/55 | HR 89 | Temp 99.5°F | Resp 28 | Wt 180.1 lb

## 2014-03-22 DIAGNOSIS — C3410 Malignant neoplasm of upper lobe, unspecified bronchus or lung: Secondary | ICD-10-CM

## 2014-03-22 DIAGNOSIS — G609 Hereditary and idiopathic neuropathy, unspecified: Secondary | ICD-10-CM

## 2014-03-22 DIAGNOSIS — R0989 Other specified symptoms and signs involving the circulatory and respiratory systems: Secondary | ICD-10-CM

## 2014-03-22 DIAGNOSIS — C3492 Malignant neoplasm of unspecified part of left bronchus or lung: Secondary | ICD-10-CM

## 2014-03-22 DIAGNOSIS — R509 Fever, unspecified: Secondary | ICD-10-CM

## 2014-03-22 DIAGNOSIS — R0602 Shortness of breath: Secondary | ICD-10-CM

## 2014-03-22 DIAGNOSIS — C3490 Malignant neoplasm of unspecified part of unspecified bronchus or lung: Secondary | ICD-10-CM

## 2014-03-22 LAB — COMPREHENSIVE METABOLIC PANEL (CC13)
ALBUMIN: 2.9 g/dL — AB (ref 3.5–5.0)
ALK PHOS: 93 U/L (ref 40–150)
ALT: 17 U/L (ref 0–55)
ANION GAP: 12 meq/L — AB (ref 3–11)
AST: 15 U/L (ref 5–34)
BUN: 8.7 mg/dL (ref 7.0–26.0)
CALCIUM: 9.7 mg/dL (ref 8.4–10.4)
CO2: 27 meq/L (ref 22–29)
Chloride: 101 mEq/L (ref 98–109)
Creatinine: 0.8 mg/dL (ref 0.7–1.3)
GLUCOSE: 143 mg/dL — AB (ref 70–140)
POTASSIUM: 3.7 meq/L (ref 3.5–5.1)
Sodium: 140 mEq/L (ref 136–145)
TOTAL PROTEIN: 6.6 g/dL (ref 6.4–8.3)
Total Bilirubin: 0.48 mg/dL (ref 0.20–1.20)

## 2014-03-22 LAB — CBC WITH DIFFERENTIAL/PLATELET
BASO%: 0.5 % (ref 0.0–2.0)
Basophils Absolute: 0 10*3/uL (ref 0.0–0.1)
EOS ABS: 0.1 10*3/uL (ref 0.0–0.5)
EOS%: 1.6 % (ref 0.0–7.0)
HCT: 31.9 % — ABNORMAL LOW (ref 38.4–49.9)
HGB: 10.6 g/dL — ABNORMAL LOW (ref 13.0–17.1)
LYMPH%: 9.8 % — ABNORMAL LOW (ref 14.0–49.0)
MCH: 31.4 pg (ref 27.2–33.4)
MCHC: 33.2 g/dL (ref 32.0–36.0)
MCV: 94.8 fL (ref 79.3–98.0)
MONO#: 0.8 10*3/uL (ref 0.1–0.9)
MONO%: 9.1 % (ref 0.0–14.0)
NEUT%: 79 % — ABNORMAL HIGH (ref 39.0–75.0)
NEUTROS ABS: 6.9 10*3/uL — AB (ref 1.5–6.5)
Platelets: 187 10*3/uL (ref 140–400)
RBC: 3.37 10*6/uL — ABNORMAL LOW (ref 4.20–5.82)
RDW: 16.3 % — AB (ref 11.0–14.6)
WBC: 8.7 10*3/uL (ref 4.0–10.3)
lymph#: 0.9 10*3/uL (ref 0.9–3.3)

## 2014-03-22 MED ORDER — INSULIN PEN NEEDLE 32G X 4 MM MISC: 1.0000 | Freq: Every day | Status: DC

## 2014-03-22 MED ORDER — AZITHROMYCIN 250 MG PO TABS: ORAL_TABLET | ORAL | Status: DC

## 2014-03-22 NOTE — Telephone Encounter (Signed)
Pt's wife calling stating that the pharmacy informed them that the pt needs a rx for the needles, pt has the lantus solostar pen and needs the needles. [facility] to [email or web address].

## 2014-03-22 NOTE — Progress Notes (Signed)
[place] [phone]) [phone]   [phone]  OFFICE PROGRESS NOTE  Drema Pry, DO [place] [address]  DIAGNOSIS: Stage IIIA (T3, N2, M0) non-small cell lung cancer consistent with squamous cell carcinoma involving the left suprahilar mass with mediastinal lymphadenopathy diagnosed in November of 2014.  PRIOR THERAPY: Concurrent chemoradiation with weekly carboplatin for AUC of 2 and paclitaxel 45 mg/M2, status post 7 cycles, last dose was given 12/20/2013. First dose on 11/01/2013.    CURRENT THERAPY: consolidation chemotherapy with carboplatin for AUC of 5 and paclitaxel 175 mg/M2 every 3 weeks with Neulasta support. First cycle on 02/08/2014.  CHEMOTHERAPY INTENT: Curative/control  CURRENT # OF CHEMOTHERAPY CYCLES: 2  CURRENT ANTIEMETICS: Zofran, dexamethasone and Compazine  CURRENT SMOKING STATUS: Former smoker  ORAL CHEMOTHERAPY AND CONSENT: None  CURRENT BISPHOSPHONATES USE: None  PAIN MANAGEMENT: 0/10  NARCOTICS INDUCED CONSTIPATION: None.  LIVING WILL AND CODE STATUS: Full code.   INTERVAL HISTORY: Ernest [name] 58 y.o. male returns to the clinic today for followup visit accompanied by his wife. The patient is congested today with low-grade fever. He also has a lot of allergy symptoms. He denied having any significant chest pain but continues to have shortness of breath and wheezes. He denied having any significant hemoptysis but has cough productive of yellowish sputum. He denied having any significant weight loss or night sweats. He denied having any nausea or vomiting. He has mild peripheral neuropathy. He was supposed to start cycle #3 of his consolidation chemotherapy today.  MEDICAL HISTORY: Past Medical History  Diagnosis Date  . CAD (coronary artery disease)     Left Main 30% stenosis, LAD 20 - 30 % stenosis, first and second diagonal branchesat 40 - 50%  stenosis with small arteries,  circumflex had 30% stenosis in the large obtuse marginal, RCA at 70 - 80%  stenosis [not felt to be occlusive after evaluation with flow wire], distal 50 - 60% stenosis - [name][  . COPD (chronic obstructive pulmonary disease)     Dr. Baird Lyons  . Depression   . Anxiety   . Hyperlipidemia   . Chronic insomnia   . Gout   . GERD (gastroesophageal reflux disease)   . PVD (peripheral vascular disease)     (ABI 0.9 on right and 0.89 on left)  severe left external iliac stenosis.  He  had successful stenting of his left external iliac per Dr. Burt Knack.  . DDD (degenerative disc disease)   . Hx of colonoscopy   . 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 Cancer     lung  . OSA (obstructive sleep apnea)     NPSG 09/10/10- AHI 11.3/hr  . Hypertension     dr Percival Spanish  . Lung cancer 10/04/13    LUL squamous cell lung cancer  . History of radiation therapy 11/10/13- 12/29/13    left lung 6600 cGy in 33 sessions    ALLERGIES:  has No Known Allergies.  MEDICATIONS:  Current Outpatient Prescriptions  Medication Sig Dispense Refill  . ACCU-CHEK SOFTCLIX LANCETS lancets Use as instructed  100 each  12  . albuterol (PROVENTIL HFA;VENTOLIN HFA) 108 (90 BASE) MCG/ACT inhaler Inhale 2 puffs into the lungs every 4 (four) hours as needed for wheezing or shortness of breath.      [name] albuterol (PROVENTIL) (2.5 MG/3ML) 0.083% nebulizer solution Take 3 mLs (2.5 mg total) by nebulization every 4 (four) hours as needed for wheezing or shortness of  breath.  360 mL  1  . ARIPiprazole (ABILIFY) 5 MG tablet Take 1 tablet (5 mg total) by mouth at bedtime.      . Armodafinil (NUVIGIL) 150 MG tablet Take 150 mg by mouth daily.      [name] atorvastatin (LIPITOR) 20 MG tablet Take 1 tablet (20 mg total) by mouth daily.  90 tablet  3  . clonazePAM (KLONOPIN) 1 MG tablet Take 1 mg by mouth at bedtime as needed. For anxiety/insomnia      . esomeprazole (NEXIUM) 40 MG capsule Take 1 capsule (40 mg total) by mouth daily before  breakfast.  90 capsule  3  . FLUoxetine (PROZAC) 40 MG capsule Take 40 mg by mouth daily.        . Fluticasone-Salmeterol (ADVAIR DISKUS) 250-50 MCG/DOSE AEPB Inhale 1 puff into the lungs 2 (two) times daily.  1 each  3  . gabapentin (NEURONTIN) 100 MG capsule Take 100 mg by mouth daily.       [name] glucose blood (ACCU-CHEK AVIVA PLUS) test strip Use as instructed  100 each  12  . Indacaterol Maleate (ARCAPTA NEOHALER) 75 MCG CAPS Place 1 capsule into inhaler and inhale daily.      . Insulin Glargine (LANTUS SOLOSTAR) 100 UNIT/ML Solostar Pen 5 units at bedtime.  5 pen  PRN  . isosorbide mononitrate (IMDUR) 30 MG 24 hr tablet Take 1 tablet (30 mg total) by mouth daily.  30 tablet  3  . methocarbamol (ROBAXIN) 500 MG tablet Take 500 mg by mouth 2 (two) times daily as needed for muscle spasms.       . metoprolol succinate (TOPROL-XL) 12.5 mg TB24 24 hr tablet Take 0.5 tablets (12.5 mg total) by mouth daily.  60 tablet  0  . mirtazapine (REMERON) 15 MG tablet Take 22.5 mg by mouth at bedtime.      [name] morphine (MS CONTIN) 60 MG 12 hr tablet Take 60 mg by mouth 2 (two) times daily.        . nitroGLYCERIN (NITROSTAT) 0.4 MG SL tablet Place 1 tablet (0.4 mg total) under the tongue every 5 (five) minutes as needed for chest pain.  25 tablet  3  . nystatin (MYCOSTATIN) 100000 UNIT/ML suspension Take 5 mLs (500,000 Units total) by mouth 4 (four) times daily.  120 mL  0  . Omega-3 Fatty Acids (FISH OIL CONCENTRATE) 1000 MG CAPS Take 1,000 mg by mouth daily.       . potassium chloride SA (K-DUR,KLOR-CON) 20 MEQ tablet Take 20 mEq by mouth daily.      . theophylline (THEODUR) 200 MG 12 hr tablet Take 1 tablet (200 mg total) by mouth 2 (two) times daily.  60 tablet  prn  . VOLTAREN 1 % GEL Apply 2 g topically daily as needed (for pain).        No current facility-administered medications for this visit.    SURGICAL HISTORY:  Past Surgical History  Procedure Laterality Date  . Spinal fusion  03/05/2007    L4-L5   . Hip surgery      left  . Arm surgery      left  . Shoulder surgery      right  . C-spine surgery    . Angioplasty    . Video bronchoscopy with endobronchial navigation N/A 10/04/2013    Procedure: VIDEO BRONCHOSCOPY WITH ENDOBRONCHIAL NAVIGATION;  Surgeon: Collene Gobble, MD;  Location: [place];  Service: Thoracic;  Laterality: N/A;  . Back surgery  REVIEW OF SYSTEMS:  Constitutional: negative Eyes: negative Ears, nose, mouth, throat, and face: negative Respiratory: positive for dyspnea on exertion Cardiovascular: negative Gastrointestinal: negative Genitourinary:negative Integument/breast: negative Hematologic/lymphatic: negative Musculoskeletal:negative Neurological: negative Behavioral/Psych: negative Endocrine: negative Allergic/Immunologic: negative   PHYSICAL EXAMINATION: General appearance: alert, cooperative and no distress Head: Normocephalic, without obvious abnormality, atraumatic Neck: no adenopathy, no JVD, supple, symmetrical, trachea midline and thyroid not enlarged, symmetric, no tenderness/mass/nodules Lymph nodes: Cervical, supraclavicular, and axillary nodes normal. Resp: wheezes bilaterally Back: symmetric, no curvature. ROM normal. No CVA tenderness. Cardio: regular rate and rhythm, S1, S2 normal, no murmur, click, rub or gallop GI: soft, non-tender; bowel sounds normal; no masses,  no organomegaly Extremities: extremities normal, atraumatic, no cyanosis or edema Neurologic: Alert and oriented X 3, normal strength and tone. Normal symmetric reflexes. Normal coordination and gait  ECOG PERFORMANCE STATUS: 0 - Asymptomatic  There were no vitals taken for this visit.  LABORATORY DATA: Lab Results  Component Value Date   WBC 8.7 03/22/2014   HGB 10.6* 03/22/2014   HCT 31.9* 03/22/2014   MCV 94.8 03/22/2014   PLT 187 03/22/2014      Chemistry      Component Value Date/Time   NA 142 03/15/2014 0907   NA 134* 03/07/2014 1620   K 3.5 03/15/2014  0907   K 3.7 03/07/2014 1620   CL 97 03/07/2014 1620   CO2 27 03/15/2014 0907   CO2 24 03/07/2014 1620   BUN 6.6* 03/15/2014 0907   BUN 11 03/07/2014 1620   CREATININE 0.8 03/15/2014 0907   CREATININE 0.8 03/07/2014 1620      Component Value Date/Time   CALCIUM 9.6 03/15/2014 0907   CALCIUM 8.9 03/07/2014 1620   ALKPHOS 112 03/15/2014 0907   ALKPHOS 132* 02/15/2014 0510   AST 14 03/15/2014 0907   AST 15 02/15/2014 0510   ALT 14 03/15/2014 0907   ALT 17 02/15/2014 0510   BILITOT 0.48 03/15/2014 0907   BILITOT 0.6 02/15/2014 0510       RADIOGRAPHIC STUDIES:  ASSESSMENT AND PLAN: This is a very pleasant 58 years old white male with stage IIIA non-small cell lung cancer, squamous cell carcinoma currently undergoing concurrent chemoradiation with weekly carboplatin and paclitaxel status post 7 weeks of treatment. He tolerated his treatment fairly well with no significant adverse effects. He is currently undergoing consolidation chemotherapy with carboplatin and paclitaxel status post 2 cycles. He tolerated the second cycle fairly well. He is not congested today with a lot of seasonal allergy and wheezes. He also has low-grade fever. I recommended for the patient to delay the start of cycle #3 by 1 week. I advise him to take allergy medicine and his wife has some Allegra that he can use. I also started the patient on Z-Pak for the chest congestion and low-grade fever. He would come back for followup visit in 4 weeks with repeat CT scan of the chest for restaging of his disease. He was advised to call immediately if he has any concerning symptoms in the interval. The patient voices understanding of current disease status and treatment options and is in agreement with the current care plan.  All questions were answered. The patient knows to call the clinic with any problems, questions or concerns. We can certainly see the patient much sooner if necessary.  Disclaimer: This note was dictated with voice  recognition software. Similar sounding words can inadvertently be transcribed and may not be corrected upon review.

## 2014-03-22 NOTE — Telephone Encounter (Signed)
gv adn printed aptps ched and avs for pt for May...sed added tx.

## 2014-03-22 NOTE — Telephone Encounter (Signed)
rx sent in electronically to [facility]

## 2014-03-23 ENCOUNTER — Telehealth: Payer: Self-pay | Admitting: Internal Medicine

## 2014-03-23 ENCOUNTER — Ambulatory Visit: Payer: Medicare HMO

## 2014-03-23 NOTE — Telephone Encounter (Signed)
Relevant patient education assigned to patient using Emmi. ° °

## 2014-03-29 ENCOUNTER — Ambulatory Visit (HOSPITAL_BASED_OUTPATIENT_CLINIC_OR_DEPARTMENT_OTHER): Payer: Commercial Managed Care - HMO

## 2014-03-29 ENCOUNTER — Other Ambulatory Visit (HOSPITAL_BASED_OUTPATIENT_CLINIC_OR_DEPARTMENT_OTHER): Payer: Commercial Managed Care - HMO

## 2014-03-29 VITALS — BP 125/56 | HR 70 | Temp 97.5°F | Resp 18

## 2014-03-29 DIAGNOSIS — C3490 Malignant neoplasm of unspecified part of unspecified bronchus or lung: Secondary | ICD-10-CM

## 2014-03-29 DIAGNOSIS — C3492 Malignant neoplasm of unspecified part of left bronchus or lung: Secondary | ICD-10-CM

## 2014-03-29 DIAGNOSIS — R059 Cough, unspecified: Secondary | ICD-10-CM

## 2014-03-29 DIAGNOSIS — Z5111 Encounter for antineoplastic chemotherapy: Secondary | ICD-10-CM

## 2014-03-29 DIAGNOSIS — R0602 Shortness of breath: Secondary | ICD-10-CM

## 2014-03-29 DIAGNOSIS — R05 Cough: Secondary | ICD-10-CM

## 2014-03-29 LAB — COMPREHENSIVE METABOLIC PANEL (CC13)
ALK PHOS: 88 U/L (ref 40–150)
ALT: 17 U/L (ref 0–55)
AST: 14 U/L (ref 5–34)
Albumin: 2.8 g/dL — ABNORMAL LOW (ref 3.5–5.0)
Anion Gap: 12 mEq/L — ABNORMAL HIGH (ref 3–11)
BILIRUBIN TOTAL: 0.42 mg/dL (ref 0.20–1.20)
BUN: 8.2 mg/dL (ref 7.0–26.0)
CALCIUM: 10 mg/dL (ref 8.4–10.4)
CO2: 28 mEq/L (ref 22–29)
CREATININE: 0.8 mg/dL (ref 0.7–1.3)
Chloride: 103 mEq/L (ref 98–109)
Glucose: 159 mg/dl — ABNORMAL HIGH (ref 70–140)
Potassium: 3.4 mEq/L — ABNORMAL LOW (ref 3.5–5.1)
Sodium: 142 mEq/L (ref 136–145)
Total Protein: 6.6 g/dL (ref 6.4–8.3)

## 2014-03-29 LAB — CBC WITH DIFFERENTIAL/PLATELET
BASO%: 0.7 % (ref 0.0–2.0)
Basophils Absolute: 0.1 10*3/uL (ref 0.0–0.1)
EOS%: 3.2 % (ref 0.0–7.0)
Eosinophils Absolute: 0.3 10*3/uL (ref 0.0–0.5)
HEMATOCRIT: 31.2 % — AB (ref 38.4–49.9)
HGB: 10.4 g/dL — ABNORMAL LOW (ref 13.0–17.1)
LYMPH#: 0.9 10*3/uL (ref 0.9–3.3)
LYMPH%: 11 % — ABNORMAL LOW (ref 14.0–49.0)
MCH: 30.8 pg (ref 27.2–33.4)
MCHC: 33.2 g/dL (ref 32.0–36.0)
MCV: 92.7 fL (ref 79.3–98.0)
MONO#: 0.8 10*3/uL (ref 0.1–0.9)
MONO%: 10 % (ref 0.0–14.0)
NEUT#: 6.1 10*3/uL (ref 1.5–6.5)
NEUT%: 75.1 % — AB (ref 39.0–75.0)
Platelets: 268 10*3/uL (ref 140–400)
RBC: 3.37 10*6/uL — ABNORMAL LOW (ref 4.20–5.82)
RDW: 16.2 % — ABNORMAL HIGH (ref 11.0–14.6)
WBC: 8.2 10*3/uL (ref 4.0–10.3)

## 2014-03-29 MED ORDER — SODIUM CHLORIDE 0.9 % IV SOLN
726.0000 mg | Freq: Once | INTRAVENOUS | Status: AC
  Administered 2014-03-29: 730 mg via INTRAVENOUS
  Filled 2014-03-29: qty 73

## 2014-03-29 MED ORDER — DIPHENHYDRAMINE HCL 50 MG/ML IJ SOLN
50.0000 mg | Freq: Once | INTRAMUSCULAR | Status: AC
  Administered 2014-03-29: 50 mg via INTRAVENOUS

## 2014-03-29 MED ORDER — DEXAMETHASONE SODIUM PHOSPHATE 20 MG/5ML IJ SOLN
20.0000 mg | Freq: Once | INTRAMUSCULAR | Status: AC
  Administered 2014-03-29: 20 mg via INTRAVENOUS

## 2014-03-29 MED ORDER — ONDANSETRON 16 MG/50ML IVPB (CHCC)
16.0000 mg | Freq: Once | INTRAVENOUS | Status: AC
  Administered 2014-03-29: 16 mg via INTRAVENOUS

## 2014-03-29 MED ORDER — SODIUM CHLORIDE 0.9 % IV SOLN
1000.0000 mL | Freq: Once | INTRAVENOUS | Status: AC | PRN
  Administered 2014-03-29: 1000 mL via INTRAVENOUS

## 2014-03-29 MED ORDER — FAMOTIDINE IN NACL 20-0.9 MG/50ML-% IV SOLN
20.0000 mg | Freq: Once | INTRAVENOUS | Status: AC
  Administered 2014-03-29: 20 mg via INTRAVENOUS

## 2014-03-29 MED ORDER — DIPHENHYDRAMINE HCL 50 MG/ML IJ SOLN
INTRAMUSCULAR | Status: AC
  Filled 2014-03-29: qty 1

## 2014-03-29 MED ORDER — DIPHENHYDRAMINE HCL 50 MG/ML IJ SOLN
25.0000 mg | Freq: Once | INTRAMUSCULAR | Status: AC | PRN
  Administered 2014-03-29: 25 mg via INTRAVENOUS

## 2014-03-29 MED ORDER — ALBUTEROL SULFATE (2.5 MG/3ML) 0.083% IN NEBU
2.5000 mg | INHALATION_SOLUTION | Freq: Once | RESPIRATORY_TRACT | Status: AC | PRN
  Administered 2014-03-29: 2.5 mg via RESPIRATORY_TRACT
  Filled 2014-03-29: qty 3

## 2014-03-29 MED ORDER — ONDANSETRON 16 MG/50ML IVPB (CHCC)
INTRAVENOUS | Status: AC
  Filled 2014-03-29: qty 16

## 2014-03-29 MED ORDER — DEXAMETHASONE SODIUM PHOSPHATE 20 MG/5ML IJ SOLN
INTRAMUSCULAR | Status: AC
  Filled 2014-03-29: qty 5

## 2014-03-29 MED ORDER — SODIUM CHLORIDE 0.9 % IV SOLN
Freq: Once | INTRAVENOUS | Status: AC
  Administered 2014-03-29: 09:00:00 via INTRAVENOUS

## 2014-03-29 MED ORDER — METHYLPREDNISOLONE SODIUM SUCC 125 MG IJ SOLR
50.0000 mg | Freq: Once | INTRAMUSCULAR | Status: AC
  Administered 2014-03-29: 50 mg via INTRAVENOUS

## 2014-03-29 MED ORDER — METHYLPREDNISOLONE SODIUM SUCC 125 MG IJ SOLR
125.0000 mg | Freq: Once | INTRAMUSCULAR | Status: AC | PRN
  Administered 2014-03-29: 60 mg via INTRAVENOUS

## 2014-03-29 MED ORDER — METHYLPREDNISOLONE SODIUM SUCC 125 MG IJ SOLR
INTRAMUSCULAR | Status: AC
  Filled 2014-03-29: qty 2

## 2014-03-29 MED ORDER — FAMOTIDINE IN NACL 20-0.9 MG/50ML-% IV SOLN
INTRAVENOUS | Status: AC
  Filled 2014-03-29: qty 50

## 2014-03-29 MED ORDER — PACLITAXEL CHEMO INJECTION 300 MG/50ML
175.0000 mg/m2 | Freq: Once | INTRAVENOUS | Status: AC
  Administered 2014-03-29: 348 mg via INTRAVENOUS
  Filled 2014-03-29: qty 58

## 2014-03-29 NOTE — Assessment & Plan Note (Signed)
Managed by oncology

## 2014-03-29 NOTE — Assessment & Plan Note (Signed)
Good compliance and control 

## 2014-03-29 NOTE — Patient Instructions (Signed)
Cancer Center Discharge Instructions for Patients Receiving Chemotherapy  Today you received the following chemotherapy agents: Taxol and Carboplatin.  To help prevent nausea and vomiting after your treatment, we encourage you to take your nausea medication as prescribed.   If you develop nausea and vomiting that is not controlled by your nausea medication, call the clinic.   BELOW ARE SYMPTOMS THAT SHOULD BE REPORTED IMMEDIATELY:  *FEVER GREATER THAN 100.5 F  *CHILLS WITH OR WITHOUT FEVER  NAUSEA AND VOMITING THAT IS NOT CONTROLLED WITH YOUR NAUSEA MEDICATION  *UNUSUAL SHORTNESS OF BREATH  *UNUSUAL BRUISING OR BLEEDING  TENDERNESS IN MOUTH AND THROAT WITH OR WITHOUT PRESENCE OF ULCERS  *URINARY PROBLEMS  *BOWEL PROBLEMS  UNUSUAL RASH Items with * indicate a potential emergency and should be followed up as soon as possible.  Feel free to call the clinic you have any questions or concerns. The clinic phone number is [phone].    

## 2014-03-29 NOTE — Assessment & Plan Note (Signed)
Counseled to stop immediately before this habit reestablishes

## 2014-03-29 NOTE — Progress Notes (Signed)
Patient started coughing and complaining of SOB. He was bright red in the face and lips were swelling. Carboplatin was stopped at 2:34pm.  Normal saline started. MD notified. Placed patient on 3L/[place]. Orders given. Solu-Medrol, albuterol treatment and IVF started. Patient's back was bright red and hot to touch. Later, started complaining of itching on back and legs. Benadryl given. VSS. (see [place] flowsheets). Wife at side. Patient's breathing was much better after breathing treatment. BP low, 83/55, so, NS started at 999. Per MD, observe patient. Do not restart Carboplatin. At 16:00 pm, VSS. MD OK with patient being discharged. Patient and wife are OK to go home.

## 2014-03-29 NOTE — Assessment & Plan Note (Signed)
Worsening dyspnea with exertion Plan-oxygen evaluation including overnight oximetry, theophylline, smoking cessation

## 2014-03-30 ENCOUNTER — Ambulatory Visit (HOSPITAL_BASED_OUTPATIENT_CLINIC_OR_DEPARTMENT_OTHER): Payer: Commercial Managed Care - HMO

## 2014-03-30 VITALS — BP 134/51 | HR 88 | Temp 97.9°F

## 2014-03-30 DIAGNOSIS — C3410 Malignant neoplasm of upper lobe, unspecified bronchus or lung: Secondary | ICD-10-CM

## 2014-03-30 DIAGNOSIS — Z5189 Encounter for other specified aftercare: Secondary | ICD-10-CM

## 2014-03-30 DIAGNOSIS — C3490 Malignant neoplasm of unspecified part of unspecified bronchus or lung: Secondary | ICD-10-CM

## 2014-03-30 MED ORDER — PEGFILGRASTIM INJECTION 6 MG/0.6ML
6.0000 mg | Freq: Once | SUBCUTANEOUS | Status: AC
  Administered 2014-03-30: 6 mg via SUBCUTANEOUS
  Filled 2014-03-30: qty 0.6

## 2014-04-05 ENCOUNTER — Other Ambulatory Visit (HOSPITAL_BASED_OUTPATIENT_CLINIC_OR_DEPARTMENT_OTHER): Payer: Medicare HMO

## 2014-04-05 DIAGNOSIS — C3492 Malignant neoplasm of unspecified part of left bronchus or lung: Secondary | ICD-10-CM

## 2014-04-05 DIAGNOSIS — C3410 Malignant neoplasm of upper lobe, unspecified bronchus or lung: Secondary | ICD-10-CM

## 2014-04-05 DIAGNOSIS — D6959 Other secondary thrombocytopenia: Secondary | ICD-10-CM

## 2014-04-05 DIAGNOSIS — D72829 Elevated white blood cell count, unspecified: Secondary | ICD-10-CM

## 2014-04-05 LAB — COMPREHENSIVE METABOLIC PANEL (CC13)
ALBUMIN: 3 g/dL — AB (ref 3.5–5.0)
ALT: 16 U/L (ref 0–55)
ANION GAP: 12 meq/L — AB (ref 3–11)
AST: 12 U/L (ref 5–34)
Alkaline Phosphatase: 125 U/L (ref 40–150)
BILIRUBIN TOTAL: 0.44 mg/dL (ref 0.20–1.20)
BUN: 7.7 mg/dL (ref 7.0–26.0)
CO2: 25 meq/L (ref 22–29)
Calcium: 9.5 mg/dL (ref 8.4–10.4)
Chloride: 100 mEq/L (ref 98–109)
Creatinine: 0.8 mg/dL (ref 0.7–1.3)
GLUCOSE: 166 mg/dL — AB (ref 70–140)
POTASSIUM: 3.4 meq/L — AB (ref 3.5–5.1)
Sodium: 137 mEq/L (ref 136–145)
Total Protein: 6.3 g/dL — ABNORMAL LOW (ref 6.4–8.3)

## 2014-04-05 LAB — CBC WITH DIFFERENTIAL/PLATELET
BASO%: 0.6 % (ref 0.0–2.0)
BASOS ABS: 0.1 10*3/uL (ref 0.0–0.1)
EOS ABS: 0.4 10*3/uL (ref 0.0–0.5)
EOS%: 1.9 % (ref 0.0–7.0)
HCT: 29.5 % — ABNORMAL LOW (ref 38.4–49.9)
HGB: 9.7 g/dL — ABNORMAL LOW (ref 13.0–17.1)
LYMPH%: 5.8 % — ABNORMAL LOW (ref 14.0–49.0)
MCH: 30 pg (ref 27.2–33.4)
MCHC: 33 g/dL (ref 32.0–36.0)
MCV: 91 fL (ref 79.3–98.0)
MONO#: 1.6 10*3/uL — ABNORMAL HIGH (ref 0.1–0.9)
MONO%: 8.3 % (ref 0.0–14.0)
NEUT%: 83.4 % — ABNORMAL HIGH (ref 39.0–75.0)
NEUTROS ABS: 16 10*3/uL — AB (ref 1.5–6.5)
PLATELETS: 159 10*3/uL (ref 140–400)
RBC: 3.25 10*6/uL — ABNORMAL LOW (ref 4.20–5.82)
RDW: 16.3 % — ABNORMAL HIGH (ref 11.0–14.6)
WBC: 19.2 10*3/uL — ABNORMAL HIGH (ref 4.0–10.3)
lymph#: 1.1 10*3/uL (ref 0.9–3.3)

## 2014-04-06 ENCOUNTER — Ambulatory Visit: Payer: Medicare HMO | Admitting: Nurse Practitioner

## 2014-04-07 ENCOUNTER — Telehealth: Payer: Self-pay | Admitting: Internal Medicine

## 2014-04-07 MED ORDER — FLUCONAZOLE 150 MG PO TABS: 150.0000 mg | ORAL_TABLET | Freq: Every day | ORAL | Status: DC

## 2014-04-07 NOTE — Telephone Encounter (Signed)
We can repeat the Diflucan and see if it seems to help again Diflucan 150 mg, # 7, 1 daily

## 2014-04-07 NOTE — Telephone Encounter (Signed)
I called spoke with spouse. Aware of recs. RX sent in. Nothing further needed

## 2014-04-07 NOTE — Telephone Encounter (Signed)
Pt c/o ongoing hoarseness for several months.  Denies sorethroat but does have some PND.  Pt states Dr Annamaria Boots gave him 4 tablets of Diflucan a while back and this seemed to help.  Please advise.  Allergies  Allergen Reactions  . Carboplatin Shortness Of Breath, Swelling and Rash    Swelling of lips, rash on face,eyes and head    Current Outpatient Prescriptions on File Prior to Visit  Medication Sig Dispense Refill  . ACCU-CHEK SOFTCLIX LANCETS lancets Use as instructed  100 each  12  . albuterol (PROVENTIL HFA;VENTOLIN HFA) 108 (90 BASE) MCG/ACT inhaler Inhale 2 puffs into the lungs every 4 (four) hours as needed for wheezing or shortness of breath.      [name] albuterol (PROVENTIL) (2.5 MG/3ML) 0.083% nebulizer solution Take 3 mLs (2.5 mg total) by nebulization every 4 (four) hours as needed for wheezing or shortness of breath.  360 mL  1  . ALPRAZolam (XANAX) 0.25 MG tablet Take 0.25 mg by mouth daily as needed.      . ARIPiprazole (ABILIFY) 5 MG tablet Take 1 tablet (5 mg total) by mouth at bedtime.      . Armodafinil (NUVIGIL) 150 MG tablet Take 150 mg by mouth daily.      [name] atorvastatin (LIPITOR) 20 MG tablet Take 1 tablet (20 mg total) by mouth daily.  90 tablet  3  . azithromycin (ZITHROMAX) 250 MG tablet Use as instructed  6 each  0  . clonazePAM (KLONOPIN) 1 MG tablet Take 1 mg by mouth at bedtime as needed. For anxiety/insomnia      . esomeprazole (NEXIUM) 40 MG capsule Take 1 capsule (40 mg total) by mouth daily before breakfast.  90 capsule  3  . FLUoxetine (PROZAC) 40 MG capsule Take 40 mg by mouth daily.        . Fluticasone-Salmeterol (ADVAIR DISKUS) 250-50 MCG/DOSE AEPB Inhale 1 puff into the lungs 2 (two) times daily.  1 each  3  . gabapentin (NEURONTIN) 100 MG capsule Take 100 mg by mouth daily.       [name] glucose blood (ACCU-CHEK AVIVA PLUS) test strip Use as instructed  100 each  12  . Indacaterol Maleate (ARCAPTA NEOHALER) 75 MCG CAPS Place 1 capsule into inhaler and inhale  daily.      . Insulin Glargine (LANTUS SOLOSTAR) 100 UNIT/ML Solostar Pen 5 units at bedtime.  5 pen  PRN  . Insulin Pen Needle (BD PEN NEEDLE NANO U/F) 32G X 4 MM MISC 1 each by Does not apply route daily.  100 each  3  . isosorbide mononitrate (IMDUR) 30 MG 24 hr tablet Take 1 tablet (30 mg total) by mouth daily.  30 tablet  3  . methocarbamol (ROBAXIN) 500 MG tablet Take 500 mg by mouth 2 (two) times daily as needed for muscle spasms.       . metoprolol succinate (TOPROL-XL) 12.5 mg TB24 24 hr tablet Take 0.5 tablets (12.5 mg total) by mouth daily.  60 tablet  0  . mirtazapine (REMERON) 15 MG tablet Take 22.5 mg by mouth at bedtime.      [name] morphine (MS CONTIN) 60 MG 12 hr tablet Take 60 mg by mouth 2 (two) times daily.        . nitroGLYCERIN (NITROSTAT) 0.4 MG SL tablet Place 1 tablet (0.4 mg total) under the tongue every 5 (five) minutes as needed for chest pain.  25 tablet  3  . Omega-3 Fatty Acids (FISH OIL CONCENTRATE)  1000 MG CAPS Take 1,000 mg by mouth daily.       . potassium chloride SA (K-DUR,KLOR-CON) 20 MEQ tablet Take 20 mEq by mouth daily.      . theophylline (THEODUR) 200 MG 12 hr tablet Take 1 tablet (200 mg total) by mouth 2 (two) times daily.  60 tablet  prn  . VOLTAREN 1 % GEL Apply 2 g topically daily as needed (for pain).        No current facility-administered medications on file prior to visit.

## 2014-04-08 ENCOUNTER — Ambulatory Visit: Payer: Medicare HMO | Admitting: Internal Medicine

## 2014-04-12 ENCOUNTER — Other Ambulatory Visit (HOSPITAL_BASED_OUTPATIENT_CLINIC_OR_DEPARTMENT_OTHER): Payer: Commercial Managed Care - HMO

## 2014-04-12 ENCOUNTER — Ambulatory Visit (HOSPITAL_COMMUNITY)
Admission: RE | Admit: 2014-04-12 | Discharge: 2014-04-12 | Disposition: A | Discharge status: Home / Self Care | Payer: Medicare HMO | Source: Ambulatory Visit | Attending: Internal Medicine | Admitting: Internal Medicine

## 2014-04-12 ENCOUNTER — Encounter (HOSPITAL_COMMUNITY): Payer: Self-pay

## 2014-04-12 ENCOUNTER — Other Ambulatory Visit: Payer: Self-pay | Admitting: Nurse Practitioner

## 2014-04-12 DIAGNOSIS — C3490 Malignant neoplasm of unspecified part of unspecified bronchus or lung: Secondary | ICD-10-CM

## 2014-04-12 DIAGNOSIS — C3410 Malignant neoplasm of upper lobe, unspecified bronchus or lung: Secondary | ICD-10-CM | POA: Insufficient documentation

## 2014-04-12 DIAGNOSIS — C3492 Malignant neoplasm of unspecified part of left bronchus or lung: Secondary | ICD-10-CM

## 2014-04-12 LAB — CBC WITH DIFFERENTIAL/PLATELET
BASO%: 0.7 % (ref 0.0–2.0)
Basophils Absolute: 0.1 10*3/uL (ref 0.0–0.1)
EOS ABS: 0.1 10*3/uL (ref 0.0–0.5)
EOS%: 0.9 % (ref 0.0–7.0)
HCT: 29.2 % — ABNORMAL LOW (ref 38.4–49.9)
HGB: 9.4 g/dL — ABNORMAL LOW (ref 13.0–17.1)
LYMPH#: 0.8 10*3/uL — AB (ref 0.9–3.3)
LYMPH%: 5.9 % — AB (ref 14.0–49.0)
MCH: 29.3 pg (ref 27.2–33.4)
MCHC: 32 g/dL (ref 32.0–36.0)
MCV: 91.3 fL (ref 79.3–98.0)
MONO#: 0.6 10*3/uL (ref 0.1–0.9)
MONO%: 4.8 % (ref 0.0–14.0)
NEUT%: 87.7 % — ABNORMAL HIGH (ref 39.0–75.0)
NEUTROS ABS: 11.6 10*3/uL — AB (ref 1.5–6.5)
Platelets: 170 10*3/uL (ref 140–400)
RBC: 3.2 10*6/uL — AB (ref 4.20–5.82)
RDW: 17.3 % — ABNORMAL HIGH (ref 11.0–14.6)
WBC: 13.2 10*3/uL — AB (ref 4.0–10.3)

## 2014-04-12 LAB — COMPREHENSIVE METABOLIC PANEL (CC13)
ALBUMIN: 2.7 g/dL — AB (ref 3.5–5.0)
ALT: 23 U/L (ref 0–55)
ANION GAP: 13 meq/L — AB (ref 3–11)
AST: 16 U/L (ref 5–34)
Alkaline Phosphatase: 102 U/L (ref 40–150)
BILIRUBIN TOTAL: 0.44 mg/dL (ref 0.20–1.20)
BUN: 9.7 mg/dL (ref 7.0–26.0)
CALCIUM: 9.4 mg/dL (ref 8.4–10.4)
CHLORIDE: 102 meq/L (ref 98–109)
CO2: 23 mEq/L (ref 22–29)
Creatinine: 0.9 mg/dL (ref 0.7–1.3)
GLUCOSE: 242 mg/dL — AB (ref 70–140)
POTASSIUM: 3.8 meq/L (ref 3.5–5.1)
SODIUM: 137 meq/L (ref 136–145)
Total Protein: 6.4 g/dL (ref 6.4–8.3)

## 2014-04-12 MED ORDER — IOHEXOL 300 MG/ML  SOLN
80.0000 mL | Freq: Once | INTRAMUSCULAR | Status: AC | PRN
Start: 2014-04-12 — End: 2014-04-12
  Administered 2014-04-12: 80 mL via INTRAVENOUS

## 2014-04-13 ENCOUNTER — Encounter: Payer: Self-pay | Admitting: Internal Medicine

## 2014-04-13 ENCOUNTER — Ambulatory Visit (INDEPENDENT_AMBULATORY_CARE_PROVIDER_SITE_OTHER): Payer: Commercial Managed Care - HMO | Admitting: Internal Medicine

## 2014-04-13 VITALS — BP 130/70 | HR 72 | Temp 98.7°F | Ht 66.0 in | Wt 176.0 lb

## 2014-04-13 DIAGNOSIS — R739 Hyperglycemia, unspecified: Secondary | ICD-10-CM

## 2014-04-13 DIAGNOSIS — J441 Chronic obstructive pulmonary disease with (acute) exacerbation: Secondary | ICD-10-CM

## 2014-04-13 DIAGNOSIS — R7309 Other abnormal glucose: Secondary | ICD-10-CM

## 2014-04-13 MED ORDER — LEVOFLOXACIN 500 MG PO TABS: 500.0000 mg | ORAL_TABLET | Freq: Every day | ORAL | Status: DC

## 2014-04-13 NOTE — Assessment & Plan Note (Signed)
Patient started on low dose Lantus.  AM blood sugars between 100-150.  Continue same dose.

## 2014-04-13 NOTE — Assessment & Plan Note (Signed)
Patient presents with dyspnea and exertion and productive cough consistent with possible COPD exacerbation. Treat with Levaquin 500 mg once daily for 10 days. Considering history of squamous cell lung cancer left lung and recent chemotherapy, we discussed inpatient management if symptoms deteriorate over the next 24-48 hours.  Use oxygen 2 LPM continuously.

## 2014-04-13 NOTE — Progress Notes (Signed)
Subjective:    Patient ID: Ernest [name], male    DOB: 1955-12-21, 58 y.o.   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 Patient recently completed chemotherapy. Over last 2 weeks patient complains of congestion, fatigue and productive cough. Patient reports sputum is whitish in color. He reports mild chills but denies fevers. He reports significant fatigue.  Recent blood work shows mild anemia with hemoglobin of 9.4 and slightly elevated white blood cell count 13.2.  Recent CT of Chest with IV contrast 04/12/14 reviewed. 1. Favor response to therapy of a residual cavitary left upper lobe lung lesion. Decreased size of the cavitary component with increased surrounding soft tissue thickening, favored to be treatment related.  Similarly, evolving radiation change within the surrounding left upper lobe and left apex. The apical presumed radiation change obscures the previously described left apical pulmonary nodule. Recommend attention on follow-up.  2. Similar borderline prevascular adenopathy.  3. Resolved left-sided pleural effusion with trace left pleural fluid remaining.  4. Minimal left lower lobe nodularity which could be infectious or inflammatory. Recommend attention on follow-up.  5. Pulmonary artery enlargement suggests pulmonary arterial hypertension.  6. Incompletely imaged similar cystic/septated splenic lesion. Favor a benign lesion such as a lymphangioma.    Review of Systems Negative for fever, dyspnea with exertion.    Past Medical History  Diagnosis Date  . CAD (coronary artery disease)     Left Main 30% stenosis, LAD 20 - 30 % stenosis, first and second diagonal branchesat 40 - 50%  stenosis with small arteries, circumflex had 30% stenosis in the large obtuse marginal, RCA at 70 - 80%  stenosis [not felt to be occlusive after evaluation with flow  wire], distal 50 - 60% stenosis - [name][  . COPD (chronic obstructive pulmonary disease)     Dr. Baird Lyons  . Depression   . Anxiety   . Hyperlipidemia   . Chronic insomnia   . Gout   . GERD (gastroesophageal reflux disease)   . PVD (peripheral vascular disease)     (ABI 0.9 on right and 0.89 on left)  severe left external iliac stenosis.  He  had successful stenting of his left external iliac per Dr. Burt Knack.  . DDD (degenerative disc disease)   . Hx of colonoscopy   . 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 OSA (obstructive sleep apnea)     NPSG 09/10/10- AHI 11.3/hr  . Hypertension     dr Percival Spanish  . History of radiation therapy 11/10/13- 12/29/13    left lung 6600 cGy in 33 sessions  . Cancer     lung  . Lung cancer 10/04/13    LUL squamous cell lung cancer    History   Social History  . Marital Status: Married    Spouse Name: N/A    Number of Children: N/A  . Years of Education: N/A   Occupational History  . Disabled welder    Social History Main Topics  . Smoking status: Current Some Day Smoker -- 0.50 packs/day for 43 years    Types: Cigarettes    Last Attempt to Quit: 09/23/2013  . Smokeless tobacco: Never Used     Comment: history of 3 PPD, 11/02/13   . Alcohol Use: No  . Drug Use: No  . Sexual Activity: No   Other Topics Concern  . Not on file  Social History Narrative  . No narrative on file    Past Surgical History  Procedure Laterality Date  . Spinal fusion  03/05/2007    L4-L5  . Hip surgery      left  . Arm surgery      left  . Shoulder surgery      right  . C-spine surgery    . Angioplasty    . Video bronchoscopy with endobronchial navigation N/A 10/04/2013    Procedure: VIDEO BRONCHOSCOPY WITH ENDOBRONCHIAL NAVIGATION;  Surgeon: Collene Gobble, MD;  Location: [facility] OR;  Service: Thoracic;  Laterality: N/A;  . Back surgery      Family History  Problem Relation Age of Onset  . Heart attack Mother   . Heart attack Sister   . Lung  cancer Sister   . Cancer Sister     small cell lung, mets to brain  . Emphysema Sister   . Hypertension Brother     Allergies  Allergen Reactions  . Carboplatin Shortness Of Breath, Swelling and Rash    Swelling of lips, rash on face,eyes and head    Current Outpatient Prescriptions on File Prior to Visit  Medication Sig Dispense Refill  . ACCU-CHEK SOFTCLIX LANCETS lancets Use as instructed  100 each  12  . albuterol (PROVENTIL HFA;VENTOLIN HFA) 108 (90 BASE) MCG/ACT inhaler Inhale 2 puffs into the lungs every 4 (four) hours as needed for wheezing or shortness of breath.      [name] albuterol (PROVENTIL) (2.5 MG/3ML) 0.083% nebulizer solution Take 3 mLs (2.5 mg total) by nebulization every 4 (four) hours as needed for wheezing or shortness of breath.  360 mL  1  . ALPRAZolam (XANAX) 0.25 MG tablet Take 0.25 mg by mouth daily as needed.      . ARIPiprazole (ABILIFY) 5 MG tablet Take 1 tablet (5 mg total) by mouth at bedtime.      . Armodafinil (NUVIGIL) 150 MG tablet Take 150 mg by mouth daily.      [name] atorvastatin (LIPITOR) 20 MG tablet Take 1 tablet (20 mg total) by mouth daily.  90 tablet  3  . clonazePAM (KLONOPIN) 1 MG tablet Take 1 mg by mouth at bedtime as needed. For anxiety/insomnia      . esomeprazole (NEXIUM) 40 MG capsule Take 1 capsule (40 mg total) by mouth daily before breakfast.  90 capsule  3  . fluconazole (DIFLUCAN) 150 MG tablet Take 1 tablet (150 mg total) by mouth daily.  7 tablet  0  . FLUoxetine (PROZAC) 40 MG capsule Take 40 mg by mouth daily.        . Fluticasone-Salmeterol (ADVAIR DISKUS) 250-50 MCG/DOSE AEPB Inhale 1 puff into the lungs 2 (two) times daily.  1 each  3  . gabapentin (NEURONTIN) 100 MG capsule Take 100 mg by mouth daily.       [name] glucose blood (ACCU-CHEK AVIVA PLUS) test strip Use as instructed  100 each  12  . Indacaterol Maleate (ARCAPTA NEOHALER) 75 MCG CAPS Place 1 capsule into inhaler and inhale daily.      . Insulin Glargine (LANTUS SOLOSTAR)  100 UNIT/ML Solostar Pen 5 units at bedtime.  5 pen  PRN  . Insulin Pen Needle (BD PEN NEEDLE NANO U/F) 32G X 4 MM MISC 1 each by Does not apply route daily.  100 each  3  . isosorbide mononitrate (IMDUR) 30 MG 24 hr tablet Take 1 tablet (30 mg total) by mouth daily.  30 tablet  3  . methocarbamol (ROBAXIN) 500 MG tablet Take 500 mg by mouth 2 (two) times daily as needed for muscle spasms.       . metoprolol succinate (TOPROL-XL) 12.5 mg TB24 24 hr tablet Take 0.5 tablets (12.5 mg total) by mouth daily.  60 tablet  0  . mirtazapine (REMERON) 15 MG tablet Take 22.5 mg by mouth at bedtime.      [name] morphine (MS CONTIN) 60 MG 12 hr tablet Take 60 mg by mouth 2 (two) times daily.        . nitroGLYCERIN (NITROSTAT) 0.4 MG SL tablet Place 1 tablet (0.4 mg total) under the tongue every 5 (five) minutes as needed for chest pain.  25 tablet  3  . Omega-3 Fatty Acids (FISH OIL CONCENTRATE) 1000 MG CAPS Take 1,000 mg by mouth daily.       . potassium chloride SA (K-DUR,KLOR-CON) 20 MEQ tablet Take 20 mEq by mouth daily.      . prochlorperazine (COMPAZINE) 10 MG tablet TAKE 1 TABLET BY MOUTH EVERY 6 HOURS AS NEEDED FOR NAUSEA AND VOMITING  60 tablet  0  . theophylline (THEODUR) 200 MG 12 hr tablet Take 1 tablet (200 mg total) by mouth 2 (two) times daily.  60 tablet  prn  . VOLTAREN 1 % GEL Apply 2 g topically daily as needed (for pain).        No current facility-administered medications on file prior to visit.    BP 130/70  Pulse 72  Temp(Src) 98.7 F (37.1 C) (Oral)  Ht 5\' 6"  (1.676 m)  Wt 176 lb (79.833 kg)  BMI 28.42 kg/m2  SpO2 89%    Objective:   Physical Exam  Constitutional: He is oriented to person, place, and time. He appears well-developed and well-nourished. No distress.  HENT:  Head: Normocephalic and atraumatic.  Mouth/Throat: Oropharynx is clear and moist.  Eyes: Pupils are equal, round, and reactive to light.  Neck: Neck supple.  Cardiovascular: Normal rate, regular rhythm and  normal heart sounds.   Pulmonary/Chest: Effort normal.  Prolonged expiration, scattered faint expiratory wheeze, no dullness to percussion  Musculoskeletal: He exhibits no edema.  Lymphadenopathy:    He has no cervical adenopathy.  Neurological: He is alert and oriented to person, place, and time. No cranial nerve deficit.  Psychiatric: He has a normal mood and affect. His behavior is normal.       Assessment & Plan:

## 2014-04-13 NOTE — Patient Instructions (Signed)
Use your albuterol nebulizer solution every 4 hrs as needed If your shortness of breath gets worse, go to the emergency room for further evaluation.

## 2014-04-13 NOTE — Progress Notes (Signed)
Pre visit review using our clinic review tool, if applicable. No additional management support is needed unless otherwise documented below in the visit note. 

## 2014-04-15 ENCOUNTER — Encounter: Payer: Self-pay | Admitting: Internal Medicine

## 2014-04-15 ENCOUNTER — Ambulatory Visit (INDEPENDENT_AMBULATORY_CARE_PROVIDER_SITE_OTHER): Payer: Commercial Managed Care - HMO | Admitting: Internal Medicine

## 2014-04-15 VITALS — BP 134/70 | HR 67 | Temp 99.0°F | Wt 174.0 lb

## 2014-04-15 DIAGNOSIS — R739 Hyperglycemia, unspecified: Secondary | ICD-10-CM

## 2014-04-15 DIAGNOSIS — R7309 Other abnormal glucose: Secondary | ICD-10-CM

## 2014-04-15 DIAGNOSIS — J441 Chronic obstructive pulmonary disease with (acute) exacerbation: Secondary | ICD-10-CM

## 2014-04-15 NOTE — Progress Notes (Signed)
Pre visit review using our clinic review tool, if applicable. No additional management support is needed unless otherwise documented below in the visit note. 

## 2014-04-15 NOTE — Patient Instructions (Addendum)
Keep using albuterol nebulizer 3-4 times per day Also perform chest physical therapy to help clear your lung secretions. Please complete the following lab tests before your next follow up appointment: BMET - 401.9 A1c - 250.00 CBCD, LFTs - 162.9 If you experience low blood sugar or your fasting blood sugar is consistently less than 100, stop Lantus

## 2014-04-15 NOTE — Assessment & Plan Note (Signed)
Improved with Levaquin.  Finish 10 day course.  I encouraged his wife to help patient with chest PT.

## 2014-04-15 NOTE — Assessment & Plan Note (Signed)
Patient advised to discontinue Lantus if fasting blood sugars < 100.

## 2014-04-15 NOTE — Progress Notes (Signed)
Subjective:    Patient ID: Ernest [name], male    DOB: [birth year], 58 y.o.   MRN: [id number]  HPI  58 year old white male with history of lung cancer and severe COPD recently seen for possible bronchitis/pneumonia for followup. Patient started on Levaquin 500 mg 2 days ago. Patient reports wheezing, cough and chest tightness significantly improved.    He denies fever or chills.  Review of Systems Negative for shortness of breath    Past Medical History  Diagnosis Date  . CAD (coronary artery disease)     Left Main 30% stenosis, LAD 20 - 30 % stenosis, first and second diagonal branchesat 40 - 50%  stenosis with small arteries, circumflex had 30% stenosis in the large obtuse marginal, RCA at 70 - 80%  stenosis [not felt to be occlusive after evaluation with flow wire], distal 50 - 60% stenosis - [name][  . COPD (chronic obstructive pulmonary disease)     Dr. Baird Lyons  . Depression   . Anxiety   . Hyperlipidemia   . Chronic insomnia   . Gout   . GERD (gastroesophageal reflux disease)   . PVD (peripheral vascular disease)     (ABI 0.9 on right and 0.89 on left)  severe left external iliac stenosis.  He  had successful stenting of his left external iliac per Dr. Burt Knack.  . DDD (degenerative disc disease)   . Hx of colonoscopy   . 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 OSA (obstructive sleep apnea)     NPSG 09/10/10- AHI 11.3/hr  . Hypertension     dr Percival Spanish  . History of radiation therapy 11/10/13- 12/29/13    left lung 6600 cGy in 33 sessions  . Cancer     lung  . Lung cancer 10/04/13    LUL squamous cell lung cancer    History   Social History  . Marital Status: Married    Spouse Name: N/A    Number of Children: N/A  . Years of Education: N/A   Occupational History  . Disabled welder    Social History Main Topics  . Smoking status: Current Some Day Smoker -- 0.50 packs/day for 43 years    Types: Cigarettes    Last Attempt to Quit: 09/23/2013  .  Smokeless tobacco: Never Used     Comment: history of 3 PPD, 11/02/13   . Alcohol Use: No  . Drug Use: No  . Sexual Activity: No   Other Topics Concern  . Not on file   Social History Narrative  . No narrative on file    Past Surgical History  Procedure Laterality Date  . Spinal fusion  03/05/2007    L4-L5  . Hip surgery      left  . Arm surgery      left  . Shoulder surgery      right  . C-spine surgery    . Angioplasty    . Video bronchoscopy with endobronchial navigation N/A 10/04/2013    Procedure: VIDEO BRONCHOSCOPY WITH ENDOBRONCHIAL NAVIGATION;  Surgeon: Collene Gobble, MD;  Location: [facility] OR;  Service: Thoracic;  Laterality: N/A;  . Back surgery      Family History  Problem Relation Age of Onset  . Heart attack Mother   . Heart attack Sister   . Lung cancer Sister   . Cancer Sister     small cell lung, mets to brain  . Emphysema Sister   . Hypertension Brother  Allergies  Allergen Reactions  . Carboplatin Shortness Of Breath, Swelling and Rash    Swelling of lips, rash on face,eyes and head    Current Outpatient Prescriptions on File Prior to Visit  Medication Sig Dispense Refill  . ACCU-CHEK SOFTCLIX LANCETS lancets Use as instructed  100 each  12  . albuterol (PROVENTIL HFA;VENTOLIN HFA) 108 (90 BASE) MCG/ACT inhaler Inhale 2 puffs into the lungs every 4 (four) hours as needed for wheezing or shortness of breath.      [name] albuterol (PROVENTIL) (2.5 MG/3ML) 0.083% nebulizer solution Take 3 mLs (2.5 mg total) by nebulization every 4 (four) hours as needed for wheezing or shortness of breath.  360 mL  1  . ALPRAZolam (XANAX) 0.25 MG tablet Take 0.25 mg by mouth daily as needed.      . ARIPiprazole (ABILIFY) 5 MG tablet Take 1 tablet (5 mg total) by mouth at bedtime.      . Armodafinil (NUVIGIL) 150 MG tablet Take 150 mg by mouth daily.      [name] atorvastatin (LIPITOR) 20 MG tablet Take 1 tablet (20 mg total) by mouth daily.  90 tablet  3  . clonazePAM  (KLONOPIN) 1 MG tablet Take 1 mg by mouth at bedtime as needed. For anxiety/insomnia      . esomeprazole (NEXIUM) 40 MG capsule Take 1 capsule (40 mg total) by mouth daily before breakfast.  90 capsule  3  . fluconazole (DIFLUCAN) 150 MG tablet Take 1 tablet (150 mg total) by mouth daily.  7 tablet  0  . FLUoxetine (PROZAC) 40 MG capsule Take 40 mg by mouth daily.        . Fluticasone-Salmeterol (ADVAIR DISKUS) 250-50 MCG/DOSE AEPB Inhale 1 puff into the lungs 2 (two) times daily.  1 each  3  . gabapentin (NEURONTIN) 100 MG capsule Take 100 mg by mouth daily.       [name] glucose blood (ACCU-CHEK AVIVA PLUS) test strip Use as instructed  100 each  12  . Indacaterol Maleate (ARCAPTA NEOHALER) 75 MCG CAPS Place 1 capsule into inhaler and inhale daily.      . Insulin Glargine (LANTUS SOLOSTAR) 100 UNIT/ML Solostar Pen 5 units at bedtime.  5 pen  PRN  . Insulin Pen Needle (BD PEN NEEDLE NANO U/F) 32G X 4 MM MISC 1 each by Does not apply route daily.  100 each  3  . isosorbide mononitrate (IMDUR) 30 MG 24 hr tablet Take 1 tablet (30 mg total) by mouth daily.  30 tablet  3  . levofloxacin (LEVAQUIN) 500 MG tablet Take 1 tablet (500 mg total) by mouth daily.  10 tablet  0  . methocarbamol (ROBAXIN) 500 MG tablet Take 500 mg by mouth 2 (two) times daily as needed for muscle spasms.       . metoprolol succinate (TOPROL-XL) 12.5 mg TB24 24 hr tablet Take 0.5 tablets (12.5 mg total) by mouth daily.  60 tablet  0  . mirtazapine (REMERON) 15 MG tablet Take 22.5 mg by mouth at bedtime.      [name] morphine (MS CONTIN) 60 MG 12 hr tablet Take 60 mg by mouth 2 (two) times daily.        . nitroGLYCERIN (NITROSTAT) 0.4 MG SL tablet Place 1 tablet (0.4 mg total) under the tongue every 5 (five) minutes as needed for chest pain.  25 tablet  3  . Omega-3 Fatty Acids (FISH OIL CONCENTRATE) 1000 MG CAPS Take 1,000 mg by mouth daily.       [name]  potassium chloride SA (K-DUR,KLOR-CON) 20 MEQ tablet Take 20 mEq by mouth daily.      .  prochlorperazine (COMPAZINE) 10 MG tablet TAKE 1 TABLET BY MOUTH EVERY 6 HOURS AS NEEDED FOR NAUSEA AND VOMITING  60 tablet  0  . theophylline (THEODUR) 200 MG 12 hr tablet Take 1 tablet (200 mg total) by mouth 2 (two) times daily.  60 tablet  prn  . VOLTAREN 1 % GEL Apply 2 g topically daily as needed (for pain).        No current facility-administered medications on file prior to visit.    BP 134/70  Pulse 67  Temp(Src) 99 F (37.2 C) (Oral)  Wt 174 lb (78.926 kg)  SpO2 97%    Objective:   Physical Exam  Constitutional: He is oriented to person, place, and time. He appears well-developed and well-nourished.  HENT:  Head: Normocephalic and atraumatic.  Mouth/Throat: Oropharynx is clear and moist.  Cardiovascular: Normal rate, regular rhythm and normal heart sounds.   No murmur heard. Pulmonary/Chest: Effort normal and breath sounds normal. He has no wheezes.  Musculoskeletal: He exhibits no edema.  Neurological: He is alert and oriented to person, place, and time. No cranial nerve deficit.  Psychiatric: He has a normal mood and affect. His behavior is normal.          Assessment & Plan:

## 2014-04-19 ENCOUNTER — Other Ambulatory Visit (HOSPITAL_BASED_OUTPATIENT_CLINIC_OR_DEPARTMENT_OTHER): Payer: Commercial Managed Care - HMO

## 2014-04-19 ENCOUNTER — Encounter: Payer: Self-pay | Admitting: Internal Medicine

## 2014-04-19 ENCOUNTER — Other Ambulatory Visit: Payer: Self-pay | Admitting: Internal Medicine

## 2014-04-19 ENCOUNTER — Telehealth: Payer: Self-pay | Admitting: Internal Medicine

## 2014-04-19 ENCOUNTER — Ambulatory Visit (HOSPITAL_BASED_OUTPATIENT_CLINIC_OR_DEPARTMENT_OTHER): Payer: Commercial Managed Care - HMO | Admitting: Internal Medicine

## 2014-04-19 ENCOUNTER — Ambulatory Visit (INDEPENDENT_AMBULATORY_CARE_PROVIDER_SITE_OTHER): Payer: Commercial Managed Care - HMO | Admitting: Internal Medicine

## 2014-04-19 VITALS — BP 114/70 | HR 54 | Ht 66.0 in | Wt 177.0 lb

## 2014-04-19 VITALS — BP 128/42 | HR 76 | Temp 98.7°F | Resp 18 | Ht 66.0 in | Wt 176.4 lb

## 2014-04-19 DIAGNOSIS — C3490 Malignant neoplasm of unspecified part of unspecified bronchus or lung: Secondary | ICD-10-CM

## 2014-04-19 DIAGNOSIS — J4489 Other specified chronic obstructive pulmonary disease: Secondary | ICD-10-CM

## 2014-04-19 DIAGNOSIS — C3410 Malignant neoplasm of upper lobe, unspecified bronchus or lung: Secondary | ICD-10-CM

## 2014-04-19 DIAGNOSIS — F17200 Nicotine dependence, unspecified, uncomplicated: Secondary | ICD-10-CM

## 2014-04-19 DIAGNOSIS — G4733 Obstructive sleep apnea (adult) (pediatric): Secondary | ICD-10-CM

## 2014-04-19 DIAGNOSIS — J449 Chronic obstructive pulmonary disease, unspecified: Secondary | ICD-10-CM

## 2014-04-19 DIAGNOSIS — J441 Chronic obstructive pulmonary disease with (acute) exacerbation: Secondary | ICD-10-CM

## 2014-04-19 LAB — CBC WITH DIFFERENTIAL/PLATELET
BASO%: 0.6 % (ref 0.0–2.0)
BASOS ABS: 0.1 10*3/uL (ref 0.0–0.1)
EOS ABS: 0.2 10*3/uL (ref 0.0–0.5)
EOS%: 1.5 % (ref 0.0–7.0)
HCT: 27.2 % — ABNORMAL LOW (ref 38.4–49.9)
HEMOGLOBIN: 8.8 g/dL — AB (ref 13.0–17.1)
LYMPH%: 8.1 % — ABNORMAL LOW (ref 14.0–49.0)
MCH: 29.2 pg (ref 27.2–33.4)
MCHC: 32.4 g/dL (ref 32.0–36.0)
MCV: 90.2 fL (ref 79.3–98.0)
MONO#: 0.8 10*3/uL (ref 0.1–0.9)
MONO%: 8 % (ref 0.0–14.0)
NEUT#: 8.3 10*3/uL — ABNORMAL HIGH (ref 1.5–6.5)
NEUT%: 81.8 % — AB (ref 39.0–75.0)
Platelets: 218 10*3/uL (ref 140–400)
RBC: 3.01 10*6/uL — ABNORMAL LOW (ref 4.20–5.82)
RDW: 17.6 % — ABNORMAL HIGH (ref 11.0–14.6)
WBC: 10.2 10*3/uL (ref 4.0–10.3)
lymph#: 0.8 10*3/uL — ABNORMAL LOW (ref 0.9–3.3)

## 2014-04-19 LAB — COMPREHENSIVE METABOLIC PANEL (CC13)
ALBUMIN: 2.7 g/dL — AB (ref 3.5–5.0)
ALK PHOS: 91 U/L (ref 40–150)
ALT: 35 U/L (ref 0–55)
AST: 18 U/L (ref 5–34)
Anion Gap: 12 mEq/L — ABNORMAL HIGH (ref 3–11)
BILIRUBIN TOTAL: 0.54 mg/dL (ref 0.20–1.20)
BUN: 10.4 mg/dL (ref 7.0–26.0)
CO2: 28 mEq/L (ref 22–29)
Calcium: 9.4 mg/dL (ref 8.4–10.4)
Chloride: 99 mEq/L (ref 98–109)
Creatinine: 0.8 mg/dL (ref 0.7–1.3)
Glucose: 122 mg/dl (ref 70–140)
Potassium: 3.7 mEq/L (ref 3.5–5.1)
Sodium: 139 mEq/L (ref 136–145)
Total Protein: 6.4 g/dL (ref 6.4–8.3)

## 2014-04-19 NOTE — Progress Notes (Signed)
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  PRIOR THERAPY:  1) Concurrent chemoradiation with weekly carboplatin for AUC of 2 and paclitaxel 45 mg/M2, status post 7 cycles, last dose was given 12/20/2013. First dose on 11/01/2013. 2) Consolidation chemotherapy with carboplatin for AUC of 5 and paclitaxel 175 mg/M2 every 3 weeks with Neulasta support. First cycle on 02/08/2014. Status post 3 cycles and carboplatin was discontinued secondary to allergic reaction.    CURRENT THERAPY: Observation  CHEMOTHERAPY INTENT: Curative/control  CURRENT # OF CHEMOTHERAPY CYCLES: 0  CURRENT ANTIEMETICS: Zofran, dexamethasone and Compazine  CURRENT SMOKING STATUS: Former smoker  ORAL CHEMOTHERAPY AND CONSENT: None  CURRENT BISPHOSPHONATES USE: None  PAIN MANAGEMENT: 0/10  NARCOTICS INDUCED CONSTIPATION: None.  LIVING WILL AND CODE STATUS: Full code.   INTERVAL HISTORY: Ernest [name] 58 y.o. male returns to the clinic today for followup visit accompanied by his wife. He had an allergic reaction to carboplatin with the last cycle of his chemotherapy and this treatment was discontinued. He was seen recently by his primary care physician and treated for questionable pneumonia. He is feeling much better today with less shortness of breath and cough. He has no fever or chills. He lost around 20 pounds during the course of this concurrent chemoradiation and consolidation chemotherapy. He denied having any nausea or vomiting. He has mild peripheral neuropathy. She had repeat CT scan of the chest performed recently and he is here for evaluation and discussion of his scan  results.  MEDICAL HISTORY: Past Medical History  Diagnosis Date  . CAD (coronary artery disease)     Left Main 30% stenosis, LAD 20 - 30 % stenosis, first and second diagonal branchesat 40 - 50%  stenosis with small arteries, circumflex had 30% stenosis in the large obtuse marginal, RCA at 70 - 80%  stenosis [not felt to be occlusive after evaluation with flow wire], distal 50 - 60% stenosis - [name][  . COPD (chronic obstructive pulmonary disease)     Dr. Baird Lyons  . Depression   . Anxiety   . Hyperlipidemia   . Chronic insomnia   . Gout   . GERD (gastroesophageal reflux disease)   . PVD (peripheral vascular disease)     (ABI 0.9 on right and 0.89 on left)  severe left external iliac stenosis.  He  had successful stenting of his left external iliac per Dr. Burt Knack.  . DDD (degenerative disc disease)   . Hx of colonoscopy   . 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 OSA (obstructive sleep apnea)     NPSG 09/10/10- AHI 11.3/hr  . Hypertension     dr Percival Spanish  . History of radiation therapy 11/10/13- 12/29/13    left lung 6600 cGy in 33 sessions  . Cancer     lung  . Lung cancer 10/04/13    LUL squamous cell lung cancer    ALLERGIES:  is allergic to carboplatin.  MEDICATIONS:  Current Outpatient Prescriptions  Medication Sig Dispense Refill  . ACCU-CHEK SOFTCLIX LANCETS lancets Use as instructed  100 each  12  . albuterol (PROVENTIL HFA;VENTOLIN HFA) 108 (90 BASE) MCG/ACT inhaler Inhale 2 puffs into the lungs every 4 (four) hours as needed  for wheezing or shortness of breath.      [name] albuterol (PROVENTIL) (2.5 MG/3ML) 0.083% nebulizer solution Take 3 mLs (2.5 mg total) by nebulization every 4 (four) hours as needed for wheezing or shortness of breath.  360 mL  1  . ALPRAZolam (XANAX) 0.25 MG tablet Take 0.25 mg by mouth daily as needed.      . ARIPiprazole (ABILIFY) 5 MG tablet Take 1 tablet (5 mg total) by mouth at bedtime.      . Armodafinil (NUVIGIL) 150 MG tablet Take 150  mg by mouth daily.      [name] atorvastatin (LIPITOR) 20 MG tablet Take 1 tablet (20 mg total) by mouth daily.  90 tablet  3  . clonazePAM (KLONOPIN) 1 MG tablet Take 1 mg by mouth at bedtime as needed. For anxiety/insomnia      . esomeprazole (NEXIUM) 40 MG capsule Take 1 capsule (40 mg total) by mouth daily before breakfast.  90 capsule  3  . FLUoxetine (PROZAC) 40 MG capsule Take 40 mg by mouth daily.        . Fluticasone-Salmeterol (ADVAIR DISKUS) 250-50 MCG/DOSE AEPB Inhale 1 puff into the lungs 2 (two) times daily.  1 each  3  . gabapentin (NEURONTIN) 100 MG capsule Take 100 mg by mouth daily.       [name] glucose blood (ACCU-CHEK AVIVA PLUS) test strip Use as instructed  100 each  12  . Indacaterol Maleate (ARCAPTA NEOHALER) 75 MCG CAPS Place 1 capsule into inhaler and inhale daily.      . Insulin Glargine (LANTUS SOLOSTAR) 100 UNIT/ML Solostar Pen 5 units at bedtime.  5 pen  PRN  . Insulin Pen Needle (BD PEN NEEDLE NANO U/F) 32G X 4 MM MISC 1 each by Does not apply route daily.  100 each  3  . isosorbide mononitrate (IMDUR) 30 MG 24 hr tablet Take 1 tablet (30 mg total) by mouth daily.  30 tablet  3  . levofloxacin (LEVAQUIN) 500 MG tablet Take 1 tablet (500 mg total) by mouth daily.  10 tablet  0  . methocarbamol (ROBAXIN) 500 MG tablet Take 500 mg by mouth 2 (two) times daily as needed for muscle spasms.       . metoprolol succinate (TOPROL-XL) 12.5 mg TB24 24 hr tablet Take 0.5 tablets (12.5 mg total) by mouth daily.  60 tablet  0  . mirtazapine (REMERON) 15 MG tablet Take 22.5 mg by mouth at bedtime.      [name] morphine (MS CONTIN) 60 MG 12 hr tablet Take 60 mg by mouth 2 (two) times daily.        . nitroGLYCERIN (NITROSTAT) 0.4 MG SL tablet Place 1 tablet (0.4 mg total) under the tongue every 5 (five) minutes as needed for chest pain.  25 tablet  3  . Omega-3 Fatty Acids (FISH OIL CONCENTRATE) 1000 MG CAPS Take 1,000 mg by mouth daily.       . potassium chloride SA (K-DUR,KLOR-CON) 20 MEQ tablet  Take 20 mEq by mouth daily.      . prochlorperazine (COMPAZINE) 10 MG tablet TAKE 1 TABLET BY MOUTH EVERY 6 HOURS AS NEEDED FOR NAUSEA AND VOMITING  60 tablet  0  . theophylline (THEODUR) 200 MG 12 hr tablet Take 1 tablet (200 mg total) by mouth 2 (two) times daily.  60 tablet  prn  . VOLTAREN 1 % GEL Apply 2 g topically daily as needed (for pain).        No  current facility-administered medications for this visit.    SURGICAL HISTORY:  Past Surgical History  Procedure Laterality Date  . Spinal fusion  03/05/2007    L4-L5  . Hip surgery      left  . Arm surgery      left  . Shoulder surgery      right  . C-spine surgery    . Angioplasty    . Video bronchoscopy with endobronchial navigation N/A 10/04/2013    Procedure: VIDEO BRONCHOSCOPY WITH ENDOBRONCHIAL NAVIGATION;  Surgeon: Collene Gobble, MD;  Location: [facility];  Service: Thoracic;  Laterality: N/A;  . Back surgery      REVIEW OF SYSTEMS:  Constitutional: positive for fatigue and weight loss Eyes: negative Ears, nose, mouth, throat, and face: negative Respiratory: positive for dyspnea on exertion Cardiovascular: negative Gastrointestinal: negative Genitourinary:negative Integument/breast: negative Hematologic/lymphatic: negative Musculoskeletal:negative Neurological: negative Behavioral/Psych: negative Endocrine: negative Allergic/Immunologic: negative   PHYSICAL EXAMINATION: General appearance: alert, cooperative and no distress Head: Normocephalic, without obvious abnormality, atraumatic Neck: no adenopathy, no JVD, supple, symmetrical, trachea midline and thyroid not enlarged, symmetric, no tenderness/mass/nodules Lymph nodes: Cervical, supraclavicular, and axillary nodes normal. Resp: wheezes bilaterally Back: symmetric, no curvature. ROM normal. No CVA tenderness. Cardio: regular rate and rhythm, S1, S2 normal, no murmur, click, rub or gallop GI: soft, non-tender; bowel sounds normal; no masses,  no  organomegaly Extremities: extremities normal, atraumatic, no cyanosis or edema Neurologic: Alert and oriented X 3, normal strength and tone. Normal symmetric reflexes. Normal coordination and gait  ECOG PERFORMANCE STATUS: 1 - Symptomatic but completely ambulatory  Blood pressure 128/42, pulse 76, temperature 98.7 F (37.1 C), temperature source Oral, resp. rate 18, height 5\' 6"  (1.676 m), weight 176 lb 6.4 oz (80.015 kg).  LABORATORY DATA: Lab Results  Component Value Date   WBC 10.2 04/19/2014   HGB 8.8* 04/19/2014   HCT 27.2* 04/19/2014   MCV 90.2 04/19/2014   PLT 218 04/19/2014      Chemistry      Component Value Date/Time   NA 137 04/12/2014 0940   NA 134* 03/07/2014 1620   K 3.8 04/12/2014 0940   K 3.7 03/07/2014 1620   CL 97 03/07/2014 1620   CO2 23 04/12/2014 0940   CO2 24 03/07/2014 1620   BUN 9.7 04/12/2014 0940   BUN 11 03/07/2014 1620   CREATININE 0.9 04/12/2014 0940   CREATININE 0.8 03/07/2014 1620      Component Value Date/Time   CALCIUM 9.4 04/12/2014 0940   CALCIUM 8.9 03/07/2014 1620   ALKPHOS 102 04/12/2014 0940   ALKPHOS 132* 02/15/2014 0510   AST 16 04/12/2014 0940   AST 15 02/15/2014 0510   ALT 23 04/12/2014 0940   ALT 17 02/15/2014 0510   BILITOT 0.44 04/12/2014 0940   BILITOT 0.6 02/15/2014 0510       RADIOGRAPHIC STUDIES: Ct Chest W Contrast  04/12/2014   CLINICAL DATA:  Lung cancer diagnosed 12/14. Chemotherapy and radiation therapy complete. Shortness of breath and fatigue. Restaging of squamous cell carcinoma.  EXAM: CT CHEST WITH CONTRAST  TECHNIQUE: Multidetector CT imaging of the chest was performed during intravenous contrast administration.  CONTRAST:  12mL OMNIPAQUE IOHEXOL 300 MG/ML  SOLN  COMPARISON:  Plain film of 02/15/2014. CT of 01/28/2014. PET of 10/19/2013.  FINDINGS: Lungs/Pleura: Narrowing of the left upper lobe bronchus, including on images 24-21. This is felt to be increased.  Mild left lower lobe nodularity. Example subpleural nodule which  measures 7 mm on image 45.  Not readily apparent on the prior, with atelectasis in this area on that exam.  Presumed radiation change within the left upper lobe which is progressive. Ground-glass opacity and developing bronchiectasis, especially at the apex. Left apical nodule described on the prior exam is presumably surrounded by radiation change today. Not well visualized.  The residual cavitary left upper lobe lung lesion is slightly smaller. 2.4 x 2.1 cm on image 18 today versus 3.2 x 2.8 cm at the same level on the prior. Increased surrounding soft tissue density which is favored to be treatment related. Increased nodularity at the inferior most aspect of the cystic component measures 1.8 cm on image 20. This is at the site of left upper lobe bronchial narrowing, and favored to also be treatment related.  Trace left-sided pleural thickening. The small left pleural effusion has resolved.  Heart/Mediastinum: No supraclavicular adenopathy. Aortic and branch vessel atherosclerosis. Mild cardiomegaly with coronary artery atherosclerosis. No central pulmonary embolism, on this non-dedicated study. Pulmonary arteries are enlarged, with the outflow tract measuring 3.1 cm.  Similar size of a prevascular node at 7 mm. No middle mediastinal or hilar adenopathy. Periaortic node measures 7 mm, unchanged.  Upper Abdomen: Normal imaged portions of the liver, stomach, pancreas, gallbladder, adrenal glands, and kidneys. Incompletely imaged multi septated cystic lesion within the posterior aspect of the spleen is grossly similar. Abdominal aortic and branch vessel atherosclerosis.  Bones/Musculoskeletal:  Lower cervical spine fixation.  IMPRESSION: 1. Favor response to therapy of a residual cavitary left upper lobe lung lesion. Decreased size of the cavitary component with increased surrounding soft tissue thickening, favored to be treatment related. Similarly, evolving radiation change within the surrounding left upper lobe and  left apex. The apical presumed radiation change obscures the previously described left apical pulmonary nodule. Recommend attention on follow-up. 2. Similar borderline prevascular adenopathy. 3. Resolved left-sided pleural effusion with trace left pleural fluid remaining. 4. Minimal left lower lobe nodularity which could be infectious or inflammatory. Recommend attention on follow-up. 5. Pulmonary artery enlargement suggests pulmonary arterial hypertension. 6. Incompletely imaged similar cystic/septated splenic lesion. Favor a benign lesion such as a lymphangioma.   Electronically Signed   By: Abigail Miyamoto M.D.   On: 04/12/2014 13:57   ASSESSMENT AND PLAN: This is a very pleasant 58 years old white male with stage IIIA non-small cell lung cancer, squamous cell carcinoma currently undergoing concurrent chemoradiation with weekly carboplatin and paclitaxel status post 7 weeks of treatment. He tolerated his treatment fairly well with no significant adverse effects. He is currently undergoing consolidation chemotherapy with carboplatin and paclitaxel status post 3 cycles. He tolerated the second cycle fairly well except for the hypersensitivity reaction to carboplatin and this treatment was discontinued on cycle #3. His recent CT scan of the chest showed resolution of the radiation pneumonitis in the left lower lobe but improvement in his disease. I discussed the scan results with the patient and his wife today. I recommended for him to continue on observation with repeat CT scan of the chest in 3 months. He was advised to call immediately if he has any concerning symptoms in the interval. The patient voices understanding of current disease status and treatment options and is in agreement with the current care plan.  All questions were answered. The patient knows to call the clinic with any problems, questions or concerns. We can certainly see the patient much sooner if necessary.  Disclaimer: This note was  dictated with voice recognition software. Similar sounding words can inadvertently  be transcribed and may not be corrected upon review.

## 2014-04-19 NOTE — Telephone Encounter (Signed)
gve the pt his aug 2015 appt calendar. Pt is aware that the rad dept will call him with the ct appt

## 2014-04-19 NOTE — Telephone Encounter (Signed)
CY Please advise if okay to refill Diflucan. Thanks.

## 2014-04-19 NOTE — Patient Instructions (Addendum)
Walk on room air for oxygen saturation   Dx COPD,   We can continue present meds.  Recommend you walk some every day to keep your strength up.   Please call as needed

## 2014-04-19 NOTE — Progress Notes (Signed)
Patient ID: Ernest [name], male    DOB: Jul 02, 1956, 58 y.o.   MRN: [id number]  HPI 07/30/11- 58 yoM smoker followed for COPD/ bronchitis, OSA/ CPAP Last here 04/01/11- note reviewed. He had felt better on a trial of maintenance prednisone. He has felt more short of breath through the hot summer, with no acute event and without blood, chest pain or fever. Easy DOE around home. Has been sharing nebulizer with his wife, who now has a cold and we discussed this. Has been worse in last month, and expecially in recent rainy weather. Noting a little ankle swelling. Smothered trying to take a shower.  Only tolerating his CPAP about 3 nights/ week because he feels smothered, waking sore in anterior chest in the mornings.  Now down to only 2-3 cigarettes/ day and trying to stop.  CXR 08/14/10- Nl NAD PFT 05/26/08- FEV1/FVC 2.44/ 0.76, incr 19% w/ BD, R 0.59 ?DLCO 0.69  09/10/11- 58 yoM smoker followed for COPD/ bronchitis, OSA/ CPAP He reports feeling better "finally doing okay". He is down to just a couple of cigarettes a day. He is using CPAP regularly now and is comfortable with it. Alpha I antitrypsin Report came back normal "M. M." on 08/09/2011. Chest x-ray from 08/15/2011 showed stable changes of COPD. He has had flu vaccine.  11/12/11- 58 yoM smoker followed for COPD/ bronchitis, OSA/ CPAP Here with wife. Has had flu shot. He says he was doing well until 2 weeks ago. He again is coughing thick mucus which is difficult to clear. He depends on his metered inhaler and nebulizer to help clear his airways. Started Mucinex 3 days ago. Mucus is white. He denies fever, sore throat, chest pain, GI upset. Because of orthopnea he is sitting up to sleep for the past 2 nights.  01/14/12- 58 yoM smoker followed for COPD/ bronchitis, OSA/ CPAP Increase congested cough green to yellow sputum but he does not feel sick and denies fever. Sister who was a smoker recently died of lung cancer. Despite that and all of our  discussions, he still smokes a few cigarettes daily. We talked again about motivation to stop completely. Continues CPAP and Provigil one or 2 daily.  03/10/12  58 yoM smoker followed for COPD/ bronchitis, OSA/ CPAP Persistent bronchitic cough. Sputum is not purulent. He is "working on" his smoking habit but blames pollen for keeping him S. coughing now. Remains fully compliant with CPAP, all night every night. Still needs Provigil to help with residual daytime sleepiness as before.  05/12/12- 58 yoM smoker followed for COPD/ bronchitis, OSA/ CPAP Stays SOB all the time-even at rest; wheezing, coughing-productive-yellow in color, chest congestion, nasal congestion,. He is more interested today in smoking cessation which we discussed. He had quit for Daliresp after 10 days because of sustained nausea. He is taking all of his regular medicines and using his rescue inhaler several times a day. Off prednisone now for 1-2 months. CXR 01/23/12-  IMPRESSION:  No acute cardiopulmonary abnormality.  Original Report Authenticated By: Randall An, M.D.   07/13/12- 58 yoM smoker followed for COPD/ bronchitis, OSA/ CPAP, complicated by CAD Pt states increased sob,wheezing,productive cough x3 months. Still smoking with no serious effort to stop. His sister has lung cancer which has gotten his attention and he may be willing to try harder. Some days his breathing is better than others with no real trend. Coughing productive of white sputum with no blood. He has all of his medications currently.  07/30/12- 58 yoM  smoker followed for COPD/ bronchitis, OSA/ CPAP, complicated by CAD Cough-productive-yellow and thick; SOB, wheezing, chest congestion, head congestion x 1 week; O2 sat of 83% RA entered room-20 seconds RA sat of 90% CT 07/21/12-  IMPRESSION:  1. Tiny bilateral lung nodules which are similar to on the prior  exam, given differences in slice selection and measurement error.  Likely subpleural lymph  nodes.  2. Age advanced coronary artery atherosclerosis. Recommend  assessment of coronary risk factors and consideration of medical  therapy.  3. Slight enlargement of splenic lesion since 2007. Favored to  represent a hemangioma or lymphangioma.  Original Report Authenticated By: Areta Haber, M.D.   08/07/12- 58 yoM smoker followed for COPD/ bronchitis, OSA/ CPAP, complicated by CAD, disability for back pain. Cough-productive-white mostly; SOB , wheezing-worse at night     wife here Coughing spasms he can feel his upper airway is closing off. He he still smokes a little even when he feels badly and we went over this again today offering support.  11/13/12- 56 yoM smoker followed for COPD/ bronchitis, OSA/ CPAP, complicated by CAD, disability for back pain. FOLLOWS NLG:XQJJH still but not productive-more SOB  Went to ER in September or acute exacerbation of COPD but not admitted. Today he is a scheduled visit. He actually says he is doing pretty well with no acute problems. Perennial nasal congestion. He has reduced his cigarette smoking to about 2 per day and is strongly encouraged to stop completely now.  01/25/13-  57 yoM smoker followed for COPD/ bronchitis, OSA/ CPAP, complicated by CAD, disability for back pain. ACUTE VISIT: started smoking agian-discuss taking Chantix; feels raw in chest area with deep cough x 2 weeks. He feels well at this visit, meaning he is at his baseline some daily nonproductive cough and shortness of breath with exertion.  05/17/13-57 yoM smoker followed for COPD/ bronchitis, OSA/ CPAP, complicated by CAD, disability for back pain. FOLLOWS ERD:EYCXKGYJE is about the same as last visit; has lessened the about the amount he is smoking. Chantix helps. Continues CPAP/ 10/[facility] Discussed smoking cessation effort.  06/10/13- 57 yoM smoker followed for COPD/ bronchitis, OSA/ CPAP, complicated by CAD, disability for back pain. ACUTE VISIT: prod cough with yellow/green  mucus, increased SOB, wheezing, tightness in chest, chills x1 week.  still on augmentin and pred taper from 7/15 phone note > no better Trying Chantix. Wife is also sick. There exposed to his brother's child who has frequent colds. Just started prednisone taper and Augmentin yesterday. Had teeth pulled.  09/16/13- 57 yoM smoker followed for COPD/ bronchitis, OSA/ CPAP, complicated by CAD, disability for back pain. FOLLOWS FOR: was given the flu shot about 1 month and has not felt good since; continues to keep a headache, chest congestion at night. Describes frontal headache, head and chest congestion since a flu shot. Using Chantix, has reduced smoking to 2 or 3 cigarettes daily. Denies infection-no fever, sputum not purulent or bloody. No chest pain. Stays on Mucinex. Not using CPAP because of head congestion. Sister has died of lung cancer/smoker.  10-25-13- 57 yoM smoker followed for COPD/ bronchitis, OSA/ CPAP, SqCell CA LUL,complicated by CAD, disability for back pain. FOLLOWS FOR: review bx results with patient; continues to have bad frontal lobe headache and lots of congestion. Nausea last night. No vomiting. Abnl CXR > CT chest/ large LUL mass to hilum. We had notified pt and scheduled bx. He is not coughing up much, denies chest pain. New headache mostly left frontal and  retro-orbital x 1 month with no lateralizing neuro. Remains tight on routine meds, off prednisone.  His wife blames "anxiety" for ER trip/ nebulizer the night before his bronchoscopy. Sister recently died of small cell Ca lung.  CT chest 09/17/13- IMPRESSION:  1. 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 This is associated with  some postobstructive changes in the left upper lobe as well as left  hilar and AP window lymphadenopathy. Based on these findings, and  assuming lack of distant metastatic disease (which will have to be  proven by other imaging  studies), this is favored to represent at  least T2b (if not T3 because of satellite nodules in the left upper  lobe), N2, Mx disease (i.e., at least stage IIIA disease). Further  evaluation with PET-CT and brain MRI with and without IV gadolinium  is suggested at this time for complete staging.  2. Mild centrilobular and paraseptal emphysema.  3. Atherosclerosis, including 3 vessel coronary artery disease.  Assessment for potential risk factor modification, dietary therapy  or pharmacologic therapy may be warranted, if clinically indicated.  4. Additional incidental findings, as above.  Electronically Signed  By: Vinnie Langton M.D.  On: 09/22/2013 11:34  Bronch / needle bx 10/04/13- by Dr Lamonte Sakai Pos Squamous Cell Ca  1/23//15- 57 yoM former smoker followed for COPD/ bronchitis, OSA/ CPAP, SqCell CA LUL/ XRT/Chemo, complicated by CAD, disability for back pain. Myocardial Perfusion 11/19/13- EF 65%, Nl wall motion, no ischemic changes. Follows for: frontal lobe headaches have gone away, SOB constantly, some prod cough with white/clear mucous.  Not smoking.  Nearing completion of current rounds of Chemo/ XRT.  Minor sore throat, breathing ok- some SOB, dry cough.  No recent steroids or ABX except Advair.  Continues CPAP 10/ [facility] with no problem.   01/06/13- 57 yoM former smoker followed for COPD/ bronchitis, OSA/ CPAP, SqCell CA LUL/ XRT/Chemo, complicated by CAD, disability for back pain. ACUTE VISIT:  Increase sob, wheezing and cough with congestion head and chest. Also, has bodyaches and sweats Acute illness x1 week started as head congestion moving to his chest with fever, white sputum turning yellow, myalgias. No blood. Had had flu shot. Has completed radiation and chemotherapy pending next oncology followup. Admits smoking 2 cigarettes since last here.  03/02/14- 86 yoM some-time smoker followed for COPD/ bronchitis, OSA/ CPAP, SqCell CA LUL/ XRT/Chemo, complicated by CAD, disability  for back pain. FOLLOWS FOR: Post hospital; Pt states that continues to have congestion, wheezing, and having left sided chest pain this morning. Had reaction to chemo yesterday-started having trouble breathing and hot flashes-was given breathing tx and injection. Hospitalized in with pneumonia, COPD with asthma 2 weeks ago. CT chest 01/28/2014 showed his left upper lobe mass to be cavitating. Yesterday he had reaction to chemotherapy requiring nebulizer treatment and injection. More dyspnea on exertion at baseline, gradually worse over the past month. Coughing clear phlegm. Nebulizer helps temporarily. This morning head left upper anterior lateral chest pain which is gradually fading. He admits he is smoking some-discussed. Continues CPAP 10/[facility] CXR 02/15/14 IMPRESSION:  No acute findings and no change from the study performed earlier  this same day.  Electronically Signed  By: Lajean Manes M.D.  On: 02/15/2014 09:23  04/19/14- 69 yoM some-time smoker followed for COPD/ bronchitis, OSA/ CPAP, SqCell CA LUL/ XRT/Chemo, complicated by CAD, disability for back pain, DM. FOLLOWS FOR: Breathing is terrible. Recently had PNA-Dr Yoo-still on abx  for this. CPAP 10/ O2 2L/ [facility] for sleep Walk test today 3x 180 m on room air- lowest sat was 92%. Scheduled for 3 more days of Levaquin for pneumonia. Improving significantly now. Cough is productive white. CT chest 04/12/14 IMPRESSION:  1. Favor response to therapy of a residual cavitary left upper lobe  lung lesion. Decreased size of the cavitary component with increased  surrounding soft tissue thickening, favored to be treatment related.  Similarly, evolving radiation change within the surrounding left  upper lobe and left apex. The apical presumed radiation change  obscures the previously described left apical pulmonary nodule.  Recommend attention on follow-up.  2. Similar borderline prevascular adenopathy.  3. Resolved left-sided pleural effusion  with trace left pleural  fluid remaining.  4. Minimal left lower lobe nodularity which could be infectious or  inflammatory. Recommend attention on follow-up.  5. Pulmonary artery enlargement suggests pulmonary arterial  hypertension.  6. Incompletely imaged similar cystic/septated splenic lesion. Favor  a benign lesion such as a lymphangioma.  Electronically Signed  By: Abigail Miyamoto M.D.  On: 04/12/2014 13:57   Review of Systems- see HPI Constitutional:   No-   weight loss, night sweats, +fevers, chills,+ fatigue, lassitude. HEENT:  No- headaches, difficulty swallowing, tooth/dental problems, sore throat,       No-  sneezing, itching, ear ache, nasal congestion, post nasal drip,  CV:  + atypical chest pain, orthopnea, PND,  Little swelling in lower extremities,  No-dizziness,                    palpitations Resp: +  shortness of breath with exertion or at rest.             + productive cough,  + non-productive cough,  No-  coughing up of blood.              No-change in color of mucus.  +some wheezing.   Skin: No-   rash or lesions. GI:  No-   heartburn, indigestion, abdominal pain, nausea, vomiting, GU:  MS:  + pain right knee after a fall   Neuro- nothing unusual  Psych:  No- change in mood or affect. No depression or anxiety.  No memory loss.    Objective:   Physical Exam General- Alert, Oriented, Affect-appropriate, Distress- none acute   Stocky/ overweight,  Skin- rash-none, lesions- none, excoriation- none.      Lymphadenopathy- none Head- atraumatic            Eyes- Gross vision intact, +PERRLA, conjunctivae- injected. + peri-orbital edema            Ears- Hearing, canals- normal            Nose- +turbinate edema, No- Septal dev, +mucus bridging, no-polyps, erosion, perforation             Throat- Mallampati III , +tongue coated/ thrush, drainage- none, tonsils- atrophic,                     + hoarse Neck- flexible , trachea midline, no stridor , thyroid nl, carotid no  bruit Chest - symmetrical excursion , unlabored           Heart/CV- RRR , no murmur , no gallop  , no rub, nl s1 s2                           - JVD- none , edema-none, stasis changes- none,  varices- none           Lung-+ cough-raspy, unlabored,  Wheeze+ right, no- cough ,                                thisdullness-none, rub- none           Chest wall-  Abd-  Br/ Gen/ Rectal- Not done, not indicated Extrem- cyanosis- none, clubbing, none, atrophy- none, strength- nl. 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

## 2014-04-20 NOTE — Telephone Encounter (Signed)
Ok to refill 

## 2014-04-22 ENCOUNTER — Ambulatory Visit: Payer: Medicare HMO | Admitting: Internal Medicine

## 2014-06-01 ENCOUNTER — Ambulatory Visit: Payer: Medicare HMO | Admitting: Internal Medicine

## 2014-06-05 NOTE — Assessment & Plan Note (Signed)
Good compliance with CPAP 10 with added oxygen 2 L/[facility]

## 2014-06-05 NOTE — Assessment & Plan Note (Signed)
Under control after recent pneumonia

## 2014-06-05 NOTE — Assessment & Plan Note (Signed)
Remains off cigarettes

## 2014-06-06 ENCOUNTER — Other Ambulatory Visit: Payer: Self-pay | Admitting: Internal Medicine

## 2014-06-07 ENCOUNTER — Telehealth: Payer: Self-pay | Admitting: Internal Medicine

## 2014-06-07 MED ORDER — ALBUTEROL SULFATE HFA 108 (90 BASE) MCG/ACT IN AERS: INHALATION_SPRAY | RESPIRATORY_TRACT | Status: DC

## 2014-06-07 NOTE — Telephone Encounter (Signed)
Using up rescue inhaler before end of month Plan change rx to give 2 per month

## 2014-06-10 ENCOUNTER — Telehealth: Payer: Self-pay | Admitting: Internal Medicine

## 2014-06-10 MED ORDER — AMOXICILLIN 500 MG PO TABS: 500.0000 mg | ORAL_TABLET | Freq: Three times a day (TID) | ORAL | Status: DC

## 2014-06-10 NOTE — Addendum Note (Signed)
Addended by: Virl Cagey on: 06/10/2014 02:09 PM   Modules accepted: Orders

## 2014-06-10 NOTE — Telephone Encounter (Signed)
Spoke with the pt's spouse  She reports that the pt has been c/o bilateral ear pain, cough, and sore throat x 1 wk  She is unsure if his sputum is purulent or not  They are going on vacation next wk and she wants something called in for this  Please advise thanks! Allergies  Allergen Reactions  . Carboplatin Shortness Of Breath, Swelling and Rash    Swelling of lips, rash on face,eyes and head   Current Outpatient Prescriptions on File Prior to Visit  Medication Sig Dispense Refill  . ACCU-CHEK SOFTCLIX LANCETS lancets Use as instructed  100 each  12  . albuterol (PROVENTIL HFA) 108 (90 BASE) MCG/ACT inhaler 2 puffs every 4 hours if needed  2 Inhaler  prn  . albuterol (PROVENTIL) (2.5 MG/3ML) 0.083% nebulizer solution Take 3 mLs (2.5 mg total) by nebulization every 4 (four) hours as needed for wheezing or shortness of breath.  360 mL  1  . ALPRAZolam (XANAX) 0.25 MG tablet Take 0.25 mg by mouth daily as needed.      . ARIPiprazole (ABILIFY) 5 MG tablet Take 1 tablet (5 mg total) by mouth at bedtime.      . Armodafinil (NUVIGIL) 150 MG tablet Take 150 mg by mouth daily.      . clonazePAM (KLONOPIN) 1 MG tablet Take 1 mg by mouth at bedtime as needed. For anxiety/insomnia      . fluconazole (DIFLUCAN) 150 MG tablet TAKE 1 TABLET (150 MG TOTAL) BY MOUTH DAILY.  7 tablet  0  . FLUoxetine (PROZAC) 40 MG capsule Take 40 mg by mouth daily.        . Fluticasone-Salmeterol (ADVAIR DISKUS) 250-50 MCG/DOSE AEPB Inhale 1 puff into the lungs 2 (two) times daily.  1 each  3  . gabapentin (NEURONTIN) 100 MG capsule Take 100 mg by mouth daily.       [name] glucose blood (ACCU-CHEK AVIVA PLUS) test strip Use as instructed  100 each  12  . Indacaterol Maleate (ARCAPTA NEOHALER) 75 MCG CAPS Place 1 capsule into inhaler and inhale daily.      . Insulin Glargine (LANTUS SOLOSTAR) 100 UNIT/ML Solostar Pen 5 units at bedtime.  5 pen  PRN  . Insulin Pen Needle (BD PEN NEEDLE NANO U/F) 32G X 4 MM MISC 1 each by Does not  apply route daily.  100 each  3  . isosorbide mononitrate (IMDUR) 30 MG 24 hr tablet Take 1 tablet (30 mg total) by mouth daily.  30 tablet  3  . levofloxacin (LEVAQUIN) 500 MG tablet Take 1 tablet (500 mg total) by mouth daily.  10 tablet  0  . methocarbamol (ROBAXIN) 500 MG tablet Take 500 mg by mouth 2 (two) times daily as needed for muscle spasms.       . metoprolol succinate (TOPROL-XL) 12.5 mg TB24 24 hr tablet Take 0.5 tablets (12.5 mg total) by mouth daily.  60 tablet  0  . mirtazapine (REMERON) 15 MG tablet Take 22.5 mg by mouth at bedtime.      [name] morphine (MS CONTIN) 60 MG 12 hr tablet Take 60 mg by mouth 2 (two) times daily.        [name] NEXIUM 40 MG capsule TAKE 1 CAPSULE (40 MG TOTAL) BY MOUTH DAILY BEFORE BREAKFAST.  90 capsule  3  . nitroGLYCERIN (NITROSTAT) 0.4 MG SL tablet Place 1 tablet (0.4 mg total) under the tongue every 5 (five) minutes as needed for chest pain.  25 tablet  3  . Omega-3 Fatty Acids (FISH OIL CONCENTRATE) 1000 MG CAPS Take 1,000 mg by mouth daily.       . potassium chloride SA (K-DUR,KLOR-CON) 20 MEQ tablet Take 20 mEq by mouth daily.      . prochlorperazine (COMPAZINE) 10 MG tablet TAKE 1 TABLET BY MOUTH EVERY 6 HOURS AS NEEDED FOR NAUSEA AND VOMITING  60 tablet  0  . theophylline (THEODUR) 200 MG 12 hr tablet Take 1 tablet (200 mg total) by mouth 2 (two) times daily.  60 tablet  prn  . VOLTAREN 1 % GEL Apply 2 g topically daily as needed (for pain).        No current facility-administered medications on file prior to visit.

## 2014-06-10 NOTE — Telephone Encounter (Signed)
Offer amox 500 mg, # 21, 1 three times daily

## 2014-06-10 NOTE — Telephone Encounter (Signed)
Amox 500mg  Take TID #21 sent to [facility] [address] Pt wife Rodena Piety aware.  Nothing further needed.

## 2014-06-13 ENCOUNTER — Other Ambulatory Visit: Payer: Self-pay | Admitting: Nurse Practitioner

## 2014-06-20 ENCOUNTER — Ambulatory Visit (INDEPENDENT_AMBULATORY_CARE_PROVIDER_SITE_OTHER): Payer: Commercial Managed Care - HMO | Admitting: Internal Medicine

## 2014-06-20 ENCOUNTER — Encounter: Payer: Self-pay | Admitting: Internal Medicine

## 2014-06-20 VITALS — BP 120/50 | HR 70 | Temp 99.1°F | Ht 66.0 in | Wt 180.0 lb

## 2014-06-20 DIAGNOSIS — R739 Hyperglycemia, unspecified: Secondary | ICD-10-CM

## 2014-06-20 DIAGNOSIS — H6982 Other specified disorders of Eustachian tube, left ear: Secondary | ICD-10-CM

## 2014-06-20 DIAGNOSIS — J449 Chronic obstructive pulmonary disease, unspecified: Secondary | ICD-10-CM

## 2014-06-20 DIAGNOSIS — R7309 Other abnormal glucose: Secondary | ICD-10-CM

## 2014-06-20 DIAGNOSIS — H6980 Other specified disorders of Eustachian tube, unspecified ear: Secondary | ICD-10-CM

## 2014-06-20 DIAGNOSIS — H6991 Unspecified Eustachian tube disorder, right ear: Secondary | ICD-10-CM | POA: Insufficient documentation

## 2014-06-20 DIAGNOSIS — R490 Dysphonia: Secondary | ICD-10-CM

## 2014-06-20 DIAGNOSIS — H6981 Other specified disorders of Eustachian tube, right ear: Secondary | ICD-10-CM | POA: Insufficient documentation

## 2014-06-20 MED ORDER — FLUTICASONE PROPIONATE 50 MCG/ACT NA SUSP
2.0000 | Freq: Every day | NASAL | Status: DC
Start: 2014-06-20 — End: 2015-08-11

## 2014-06-20 MED ORDER — ESOMEPRAZOLE MAGNESIUM 40 MG PO CPDR: 40.0000 mg | DELAYED_RELEASE_CAPSULE | Freq: Two times a day (BID) | ORAL | Status: DC

## 2014-06-20 NOTE — Assessment & Plan Note (Signed)
Plan to repeat BMET and A1c at next office visit. Lab Results  Component Value Date   HGBA1C 7.0* 03/07/2014

## 2014-06-20 NOTE — Assessment & Plan Note (Addendum)
Patient has signs and symptoms of right eustachian tube dysfunction.   Treat with intranasal Flonase-2 sprays each nostril once daily.

## 2014-06-20 NOTE — Progress Notes (Signed)
Subjective:    Patient ID: Ernest [name], male    DOB: February 28, 1956, 58 y.o.   MRN: [id number]  HPI  59 year old white male with non-small cell lung cancer, squamous cell carcinoma, severe COPD and CAD for follow up.  Patient finished his chemotherapy. He complained to his oncologist of left ear pain associated with symptoms of upper respiratory infection. He was treated with amoxicillin 500 mg 3 times a day 1-2 weeks ago. He finished full course of antibiotic. He complains of persistent popping sensation of left ear.  Patient also complains of chronic hoarseness. He has history of severe GERD. She takes Nexium 40 mg at bedtime.   Diabetes II - stable  Review of Systems Negative for fever or chills, chronic hoarsness    Past Medical History  Diagnosis Date  . CAD (coronary artery disease)     Left Main 30% stenosis, LAD 20 - 30 % stenosis, first and second diagonal branchesat 40 - 50%  stenosis with small arteries, circumflex had 30% stenosis in the large obtuse marginal, RCA at 70 - 80%  stenosis [not felt to be occlusive after evaluation with flow wire], distal 50 - 60% stenosis - [name][  . COPD (chronic obstructive pulmonary disease)     Dr. Baird Lyons  . Depression   . Anxiety   . Hyperlipidemia   . Chronic insomnia   . Gout   . GERD (gastroesophageal reflux disease)   . PVD (peripheral vascular disease)     (ABI 0.9 on right and 0.89 on left)  severe left external iliac stenosis.  He  had successful stenting of his left external iliac per Dr. Burt Knack.  . DDD (degenerative disc disease)   . Hx of colonoscopy   . 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 OSA (obstructive sleep apnea)     NPSG 09/10/10- AHI 11.3/hr  . Hypertension     dr Percival Spanish  . History of radiation therapy 11/10/13- 12/29/13    left lung 6600 cGy in 33 sessions  . Cancer     lung  . Lung cancer 10/04/13    LUL squamous cell lung cancer    History   Social History  . Marital Status: Married      Spouse Name: N/A    Number of Children: N/A  . Years of Education: N/A   Occupational History  . Disabled welder    Social History Main Topics  . Smoking status: Former Smoker -- 0.50 packs/day for 43 years    Types: Cigarettes    Quit date: 09/23/2013  . Smokeless tobacco: Never Used     Comment: history of 3 PPD, 11/02/13   . Alcohol Use: No  . Drug Use: No  . Sexual Activity: No   Other Topics Concern  . Not on file   Social History Narrative  . No narrative on file    Past Surgical History  Procedure Laterality Date  . Spinal fusion  03/05/2007    L4-L5  . Hip surgery      left  . Arm surgery      left  . Shoulder surgery      right  . C-spine surgery    . Angioplasty    . Video bronchoscopy with endobronchial navigation N/A 10/04/2013    Procedure: VIDEO BRONCHOSCOPY WITH ENDOBRONCHIAL NAVIGATION;  Surgeon: Collene Gobble, MD;  Location: [facility];  Service: Thoracic;  Laterality: N/A;  . Back surgery      Family History  Problem  Relation Age of Onset  . Heart attack Mother   . Heart attack Sister   . Lung cancer Sister   . Cancer Sister     small cell lung, mets to brain  . Emphysema Sister   . Hypertension Brother     Allergies  Allergen Reactions  . Carboplatin Shortness Of Breath, Swelling and Rash    Swelling of lips, rash on face,eyes and head    Current Outpatient Prescriptions on File Prior to Visit  Medication Sig Dispense Refill  . ACCU-CHEK SOFTCLIX LANCETS lancets Use as instructed  100 each  12  . albuterol (PROVENTIL HFA) 108 (90 BASE) MCG/ACT inhaler 2 puffs every 4 hours if needed  2 Inhaler  prn  . albuterol (PROVENTIL) (2.5 MG/3ML) 0.083% nebulizer solution Take 3 mLs (2.5 mg total) by nebulization every 4 (four) hours as needed for wheezing or shortness of breath.  360 mL  1  . ALPRAZolam (XANAX) 0.25 MG tablet Take 0.25 mg by mouth daily as needed.      . ARIPiprazole (ABILIFY) 5 MG tablet Take 1 tablet (5 mg total) by mouth at  bedtime.      . Armodafinil (NUVIGIL) 150 MG tablet Take 150 mg by mouth daily.      . clonazePAM (KLONOPIN) 1 MG tablet Take 1 mg by mouth at bedtime as needed. For anxiety/insomnia      . FLUoxetine (PROZAC) 40 MG capsule Take 40 mg by mouth daily.        . Fluticasone-Salmeterol (ADVAIR DISKUS) 250-50 MCG/DOSE AEPB Inhale 1 puff into the lungs 2 (two) times daily.  1 each  3  . gabapentin (NEURONTIN) 100 MG capsule Take 100 mg by mouth daily.       [name] glucose blood (ACCU-CHEK AVIVA PLUS) test strip Use as instructed  100 each  12  . Indacaterol Maleate (ARCAPTA NEOHALER) 75 MCG CAPS Place 1 capsule into inhaler and inhale daily.      . Insulin Glargine (LANTUS SOLOSTAR) 100 UNIT/ML Solostar Pen 5 units at bedtime.  5 pen  PRN  . Insulin Pen Needle (BD PEN NEEDLE NANO U/F) 32G X 4 MM MISC 1 each by Does not apply route daily.  100 each  3  . isosorbide mononitrate (IMDUR) 30 MG 24 hr tablet TAKE 1 TABLET (30 MG TOTAL) BY MOUTH DAILY.  30 tablet  1  . methocarbamol (ROBAXIN) 500 MG tablet Take 500 mg by mouth 2 (two) times daily as needed for muscle spasms.       . metoprolol succinate (TOPROL-XL) 12.5 mg TB24 24 hr tablet Take 0.5 tablets (12.5 mg total) by mouth daily.  60 tablet  0  . mirtazapine (REMERON) 15 MG tablet Take 22.5 mg by mouth at bedtime.      [name] morphine (MS CONTIN) 60 MG 12 hr tablet Take 60 mg by mouth 2 (two) times daily.        . nitroGLYCERIN (NITROSTAT) 0.4 MG SL tablet Place 1 tablet (0.4 mg total) under the tongue every 5 (five) minutes as needed for chest pain.  25 tablet  3  . Omega-3 Fatty Acids (FISH OIL CONCENTRATE) 1000 MG CAPS Take 1,000 mg by mouth daily.       . potassium chloride SA (K-DUR,KLOR-CON) 20 MEQ tablet Take 20 mEq by mouth daily.      . prochlorperazine (COMPAZINE) 10 MG tablet TAKE 1 TABLET BY MOUTH EVERY 6 HOURS AS NEEDED FOR NAUSEA AND VOMITING  60 tablet  0  .  theophylline (THEODUR) 200 MG 12 hr tablet Take 1 tablet (200 mg total) by mouth 2 (two)  times daily.  60 tablet  prn  . VOLTAREN 1 % GEL Apply 2 g topically daily as needed (for pain).        No current facility-administered medications on file prior to visit.    BP 120/50  Pulse 70  Temp(Src) 99.1 F (37.3 C) (Oral)  Ht 5\' 6"  (1.676 m)  Wt 180 lb (81.647 kg)  BMI 29.07 kg/m2     Objective:   Physical Exam  Constitutional: He appears well-developed and well-nourished.  HENT:  Head: Normocephalic and atraumatic.  Left Ear: External ear normal.  Mouth/Throat: Oropharynx is clear and moist.  Right tympanic membrane slightly retracted  Neck: Neck supple.  Cardiovascular: Normal rate, regular rhythm and normal heart sounds.   No murmur heard. Pulmonary/Chest: Effort normal and breath sounds normal. He has no wheezes.  Musculoskeletal: He exhibits no edema.  Lymphadenopathy:    He has no cervical adenopathy.  Neurological: He is alert. No cranial nerve deficit.  Psychiatric: He has a normal mood and affect. His behavior is normal.          Assessment & Plan:

## 2014-06-20 NOTE — Assessment & Plan Note (Addendum)
Patient symptoms may be secondary to refractory reflux. Increase Nexium to 40 mg twice a day before meals. Anti-reflex measures also discussed.  If no improvement, we discussed referral to ENT.

## 2014-06-20 NOTE — Progress Notes (Signed)
Pre visit review using our clinic review tool, if applicable. No additional management support is needed unless otherwise documented below in the visit note. 

## 2014-06-20 NOTE — Patient Instructions (Signed)
Please contact our office if your symptoms do not improve or gets worse.  

## 2014-06-22 ENCOUNTER — Other Ambulatory Visit: Payer: Self-pay | Admitting: Nurse Practitioner

## 2014-06-27 ENCOUNTER — Telehealth: Payer: Self-pay | Admitting: Internal Medicine

## 2014-06-27 NOTE — Telephone Encounter (Signed)
What medication

## 2014-06-27 NOTE — Telephone Encounter (Signed)
nexium 

## 2014-06-27 NOTE — Telephone Encounter (Signed)
[employer] denied the quantity of 180 per 90 days.  pts plan only allows 90 per 90 days.  The representative stated pt can have that filled in September.

## 2014-06-28 NOTE — Telephone Encounter (Signed)
Ok tell pt to use nexium once daily before meal as directed.

## 2014-06-29 NOTE — Telephone Encounter (Signed)
Left message for pt to call back  °

## 2014-07-08 NOTE — Telephone Encounter (Signed)
Left message for pt to call back  °

## 2014-07-18 ENCOUNTER — Ambulatory Visit (INDEPENDENT_AMBULATORY_CARE_PROVIDER_SITE_OTHER): Payer: Commercial Managed Care - HMO | Admitting: Internal Medicine

## 2014-07-18 ENCOUNTER — Encounter: Payer: Self-pay | Admitting: Internal Medicine

## 2014-07-18 VITALS — BP 114/72 | HR 69 | Temp 98.8°F | Wt 182.0 lb

## 2014-07-18 DIAGNOSIS — H6981 Other specified disorders of Eustachian tube, right ear: Secondary | ICD-10-CM

## 2014-07-18 DIAGNOSIS — R490 Dysphonia: Secondary | ICD-10-CM

## 2014-07-18 DIAGNOSIS — R7309 Other abnormal glucose: Secondary | ICD-10-CM

## 2014-07-18 DIAGNOSIS — Z23 Encounter for immunization: Secondary | ICD-10-CM

## 2014-07-18 DIAGNOSIS — R739 Hyperglycemia, unspecified: Secondary | ICD-10-CM

## 2014-07-18 DIAGNOSIS — H6980 Other specified disorders of Eustachian tube, unspecified ear: Secondary | ICD-10-CM

## 2014-07-18 DIAGNOSIS — E7800 Pure hypercholesterolemia, unspecified: Secondary | ICD-10-CM

## 2014-07-18 DIAGNOSIS — H6990 Unspecified Eustachian tube disorder, unspecified ear: Secondary | ICD-10-CM

## 2014-07-18 LAB — LIPID PANEL
CHOLESTEROL: 134 mg/dL (ref 0–200)
HDL: 35.3 mg/dL — ABNORMAL LOW (ref >39.00)
LDL Cholesterol: 62 mg/dL (ref 0–99)
NonHDL: 98.7
Total CHOL/HDL Ratio: 4
Triglycerides: 185 mg/dL — ABNORMAL HIGH (ref 0.0–149.0)
VLDL: 37 mg/dL (ref 0.0–40.0)

## 2014-07-18 LAB — MICROALBUMIN / CREATININE URINE RATIO
Creatinine,U: 61 mg/dL
MICROALB/CREAT RATIO: 0.2 mg/g (ref 0.0–30.0)
Microalb, Ur: 0.1 mg/dL (ref 0.0–1.9)

## 2014-07-18 LAB — HEPATIC FUNCTION PANEL
ALK PHOS: 125 U/L — AB (ref 39–117)
ALT: 16 U/L (ref 0–53)
AST: 21 U/L (ref 0–37)
Albumin: 3.5 g/dL (ref 3.5–5.2)
Bilirubin, Direct: 0.1 mg/dL (ref 0.0–0.3)
TOTAL PROTEIN: 6.8 g/dL (ref 6.0–8.3)
Total Bilirubin: 0.3 mg/dL (ref 0.2–1.2)

## 2014-07-18 LAB — HEMOGLOBIN A1C: Hgb A1c MFr Bld: 6.1 % (ref 4.6–6.5)

## 2014-07-18 LAB — BASIC METABOLIC PANEL
BUN: 10 mg/dL (ref 6–23)
CHLORIDE: 98 meq/L (ref 96–112)
CO2: 30 meq/L (ref 19–32)
CREATININE: 0.8 mg/dL (ref 0.4–1.5)
Calcium: 9.2 mg/dL (ref 8.4–10.5)
GFR: 111.36 mL/min (ref >60.00)
GLUCOSE: 103 mg/dL — AB (ref 70–99)
Potassium: 4.3 mEq/L (ref 3.5–5.1)
Sodium: 136 mEq/L (ref 135–145)

## 2014-07-18 NOTE — Progress Notes (Signed)
Subjective:    Patient ID: Ernest [name], male    DOB: January 10, 1956, 58 y.o.   MRN: [id number]  HPI  58 year old white male with history of non-small cell lung cancer, squamous cell carcinoma and severe COPD for followup. Patient previously seen for possible eustachian tube dysfunction. Patient has been using nasal steroids but he reports persistent clicking sensation in right ear.  Patient also complains of persistent chronic hoarseness. We tried to increase his Nexium to twice daily but his insurance company denied coverage. Patient reports his hoarseness has been ongoing for 9 months. It seems to have worsened after chemotherapy and radiation.  Abnormal glucose-stable   Review of Systems Weight is fairly stable, negative for fever chills    Past Medical History  Diagnosis Date  . CAD (coronary artery disease)     Left Main 30% stenosis, LAD 20 - 30 % stenosis, first and second diagonal branchesat 40 - 50%  stenosis with small arteries, circumflex had 30% stenosis in the large obtuse marginal, RCA at 70 - 80%  stenosis [not felt to be occlusive after evaluation with flow wire], distal 50 - 60% stenosis - [name][  . COPD (chronic obstructive pulmonary disease)     Dr. Baird Lyons  . Depression   . Anxiety   . Hyperlipidemia   . Chronic insomnia   . Gout   . GERD (gastroesophageal reflux disease)   . PVD (peripheral vascular disease)     (ABI 0.9 on right and 0.89 on left)  severe left external iliac stenosis.  He  had successful stenting of his left external iliac per Dr. Burt Knack.  . DDD (degenerative disc disease)   . Hx of colonoscopy   . 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 OSA (obstructive sleep apnea)     NPSG 09/10/10- AHI 11.3/hr  . Hypertension     dr Percival Spanish  . History of radiation therapy 11/10/13- 12/29/13    left lung 6600 cGy in 33 sessions  . Cancer     lung  . Lung cancer 10/04/13    LUL squamous cell lung cancer    History   Social History  .  Marital Status: Married    Spouse Name: N/A    Number of Children: N/A  . Years of Education: N/A   Occupational History  . Disabled welder    Social History Main Topics  . Smoking status: Former Smoker -- 0.50 packs/day for 43 years    Types: Cigarettes    Quit date: 09/23/2013  . Smokeless tobacco: Never Used     Comment: history of 3 PPD, 11/02/13   . Alcohol Use: No  . Drug Use: No  . Sexual Activity: No   Other Topics Concern  . Not on file   Social History Narrative  . No narrative on file    Past Surgical History  Procedure Laterality Date  . Spinal fusion  03/05/2007    L4-L5  . Hip surgery      left  . Arm surgery      left  . Shoulder surgery      right  . C-spine surgery    . Angioplasty    . Video bronchoscopy with endobronchial navigation N/A 10/04/2013    Procedure: VIDEO BRONCHOSCOPY WITH ENDOBRONCHIAL NAVIGATION;  Surgeon: Collene Gobble, MD;  Location: [facility];  Service: Thoracic;  Laterality: N/A;  . Back surgery      Family History  Problem Relation Age of Onset  . Heart  attack Mother   . Heart attack Sister   . Lung cancer Sister   . Cancer Sister     small cell lung, mets to brain  . Emphysema Sister   . Hypertension Brother     Allergies  Allergen Reactions  . Carboplatin Shortness Of Breath, Swelling and Rash    Swelling of lips, rash on face,eyes and head    Current Outpatient Prescriptions on File Prior to Visit  Medication Sig Dispense Refill  . ACCU-CHEK SOFTCLIX LANCETS lancets Use as instructed  100 each  12  . albuterol (PROVENTIL HFA) 108 (90 BASE) MCG/ACT inhaler 2 puffs every 4 hours if needed  2 Inhaler  prn  . albuterol (PROVENTIL) (2.5 MG/3ML) 0.083% nebulizer solution Take 3 mLs (2.5 mg total) by nebulization every 4 (four) hours as needed for wheezing or shortness of breath.  360 mL  1  . ALPRAZolam (XANAX) 0.25 MG tablet Take 0.25 mg by mouth daily as needed.      . ARIPiprazole (ABILIFY) 5 MG tablet Take 1 tablet (5  mg total) by mouth at bedtime.      . Armodafinil (NUVIGIL) 150 MG tablet Take 150 mg by mouth daily.      . clonazePAM (KLONOPIN) 1 MG tablet Take 1 mg by mouth at bedtime as needed. For anxiety/insomnia      . FLUoxetine (PROZAC) 40 MG capsule Take 40 mg by mouth daily.        . fluticasone (FLONASE) 50 MCG/ACT nasal spray Place 2 sprays into both nostrils daily.  16 g  3  . Fluticasone-Salmeterol (ADVAIR DISKUS) 250-50 MCG/DOSE AEPB Inhale 1 puff into the lungs 2 (two) times daily.  1 each  3  . gabapentin (NEURONTIN) 100 MG capsule Take 100 mg by mouth daily.       [name] glucose blood (ACCU-CHEK AVIVA PLUS) test strip Use as instructed  100 each  12  . Indacaterol Maleate (ARCAPTA NEOHALER) 75 MCG CAPS Place 1 capsule into inhaler and inhale daily.      . Insulin Glargine (LANTUS SOLOSTAR) 100 UNIT/ML Solostar Pen 5 units at bedtime.  5 pen  PRN  . Insulin Pen Needle (BD PEN NEEDLE NANO U/F) 32G X 4 MM MISC 1 each by Does not apply route daily.  100 each  3  . isosorbide mononitrate (IMDUR) 30 MG 24 hr tablet TAKE 1 TABLET (30 MG TOTAL) BY MOUTH DAILY.  30 tablet  1  . methocarbamol (ROBAXIN) 500 MG tablet Take 500 mg by mouth 2 (two) times daily as needed for muscle spasms.       . metoprolol succinate (TOPROL-XL) 25 MG 24 hr tablet TAKE 1/2 TABLET BY MOUTH DAILY  15 tablet  0  . mirtazapine (REMERON) 15 MG tablet Take 22.5 mg by mouth at bedtime.      [name] morphine (MS CONTIN) 60 MG 12 hr tablet Take 60 mg by mouth 2 (two) times daily.        . nitroGLYCERIN (NITROSTAT) 0.4 MG SL tablet Place 1 tablet (0.4 mg total) under the tongue every 5 (five) minutes as needed for chest pain.  25 tablet  3  . Omega-3 Fatty Acids (FISH OIL CONCENTRATE) 1000 MG CAPS Take 1,000 mg by mouth daily.       . potassium chloride SA (K-DUR,KLOR-CON) 20 MEQ tablet Take 20 mEq by mouth daily.      . prochlorperazine (COMPAZINE) 10 MG tablet TAKE 1 TABLET BY MOUTH EVERY 6 HOURS AS NEEDED  FOR NAUSEA AND VOMITING  60 tablet   0  . theophylline (THEODUR) 200 MG 12 hr tablet Take 1 tablet (200 mg total) by mouth 2 (two) times daily.  60 tablet  prn  . VOLTAREN 1 % GEL Apply 2 g topically daily as needed (for pain).        No current facility-administered medications on file prior to visit.    BP 114/72  Pulse 69  Temp(Src) 98.8 F (37.1 C) (Oral)  Wt 182 lb (82.555 kg)  SpO2 96%    Objective:   Physical Exam  Constitutional: He is oriented to person, place, and time. He appears well-developed and well-nourished. No distress.  HENT:  Head: Normocephalic and atraumatic.  Right Ear: External ear normal.  Left Ear: External ear normal.  Neck: Neck supple.  Cardiovascular: Normal rate, regular rhythm and normal heart sounds.   No murmur heard. Pulmonary/Chest: Effort normal and breath sounds normal. He has no wheezes.  Lymphadenopathy:    He has no cervical adenopathy.  Neurological: He is alert and oriented to person, place, and time. No cranial nerve deficit.  Psychiatric: He has a normal mood and affect.          Assessment & Plan:

## 2014-07-18 NOTE — Assessment & Plan Note (Signed)
No improvement.  Ins co denied bid Nexium.  Refer to ENT for further evaluation.

## 2014-07-18 NOTE — Assessment & Plan Note (Addendum)
Monitor FLP and LFTs. Lab Results  Component Value Date   CHOL 134 07/18/2014   HDL 35.30* 07/18/2014   LDLCALC 62 07/18/2014   LDLDIRECT 71.4 09/08/2012   TRIG 185.0* 07/18/2014   CHOLHDL 4 07/18/2014   Lab Results  Component Value Date   ALT 16 07/18/2014   AST 21 07/18/2014   ALKPHOS 125* 07/18/2014   BILITOT 0.3 07/18/2014

## 2014-07-18 NOTE — Assessment & Plan Note (Addendum)
Monitor A1c.  Patient updated with flu vaccine and pneumovax. Lab Results  Component Value Date   HGBA1C 6.1 07/18/2014

## 2014-07-18 NOTE — Progress Notes (Signed)
Pre visit review using our clinic review tool, if applicable. No additional management support is needed unless otherwise documented below in the visit note. 

## 2014-07-18 NOTE — Assessment & Plan Note (Signed)
No significant improvement.  Continue intranasal steroids.

## 2014-07-19 ENCOUNTER — Other Ambulatory Visit (HOSPITAL_BASED_OUTPATIENT_CLINIC_OR_DEPARTMENT_OTHER): Payer: Commercial Managed Care - HMO

## 2014-07-19 ENCOUNTER — Ambulatory Visit (INDEPENDENT_AMBULATORY_CARE_PROVIDER_SITE_OTHER): Payer: Commercial Managed Care - HMO | Admitting: Internal Medicine

## 2014-07-19 ENCOUNTER — Encounter: Payer: Self-pay | Admitting: Internal Medicine

## 2014-07-19 ENCOUNTER — Ambulatory Visit (HOSPITAL_COMMUNITY)
Admission: RE | Admit: 2014-07-19 | Discharge: 2014-07-19 | Disposition: A | Discharge status: Home / Self Care | Payer: Medicare HMO | Source: Ambulatory Visit | Attending: Internal Medicine | Admitting: Internal Medicine

## 2014-07-19 ENCOUNTER — Encounter (HOSPITAL_COMMUNITY): Payer: Self-pay

## 2014-07-19 VITALS — BP 110/62 | HR 65 | Ht 65.5 in | Wt 181.6 lb

## 2014-07-19 DIAGNOSIS — C3490 Malignant neoplasm of unspecified part of unspecified bronchus or lung: Secondary | ICD-10-CM

## 2014-07-19 DIAGNOSIS — J449 Chronic obstructive pulmonary disease, unspecified: Secondary | ICD-10-CM

## 2014-07-19 DIAGNOSIS — J4489 Other specified chronic obstructive pulmonary disease: Secondary | ICD-10-CM | POA: Diagnosis not present

## 2014-07-19 DIAGNOSIS — C3410 Malignant neoplasm of upper lobe, unspecified bronchus or lung: Secondary | ICD-10-CM

## 2014-07-19 DIAGNOSIS — G4733 Obstructive sleep apnea (adult) (pediatric): Secondary | ICD-10-CM | POA: Diagnosis not present

## 2014-07-19 LAB — COMPREHENSIVE METABOLIC PANEL (CC13)
ALT: 13 U/L (ref 0–55)
ANION GAP: 9 meq/L (ref 3–11)
AST: 15 U/L (ref 5–34)
Albumin: 3.3 g/dL — ABNORMAL LOW (ref 3.5–5.0)
Alkaline Phosphatase: 140 U/L (ref 40–150)
BILIRUBIN TOTAL: 0.37 mg/dL (ref 0.20–1.20)
BUN: 9.8 mg/dL (ref 7.0–26.0)
CO2: 29 meq/L (ref 22–29)
CREATININE: 0.8 mg/dL (ref 0.7–1.3)
Calcium: 9.6 mg/dL (ref 8.4–10.4)
Chloride: 102 mEq/L (ref 98–109)
GLUCOSE: 112 mg/dL (ref 70–140)
Potassium: 4 mEq/L (ref 3.5–5.1)
Sodium: 140 mEq/L (ref 136–145)
Total Protein: 6.7 g/dL (ref 6.4–8.3)

## 2014-07-19 LAB — CBC WITH DIFFERENTIAL/PLATELET
BASO%: 0.4 % (ref 0.0–2.0)
BASOS ABS: 0 10*3/uL (ref 0.0–0.1)
EOS%: 2.8 % (ref 0.0–7.0)
Eosinophils Absolute: 0.3 10*3/uL (ref 0.0–0.5)
HEMATOCRIT: 40.7 % (ref 38.4–49.9)
HEMOGLOBIN: 13.2 g/dL (ref 13.0–17.1)
LYMPH#: 1.1 10*3/uL (ref 0.9–3.3)
LYMPH%: 11.7 % — ABNORMAL LOW (ref 14.0–49.0)
MCH: 27.9 pg (ref 27.2–33.4)
MCHC: 32.5 g/dL (ref 32.0–36.0)
MCV: 85.7 fL (ref 79.3–98.0)
MONO#: 0.9 10*3/uL (ref 0.1–0.9)
MONO%: 9 % (ref 0.0–14.0)
NEUT#: 7.2 10*3/uL — ABNORMAL HIGH (ref 1.5–6.5)
NEUT%: 76.1 % — AB (ref 39.0–75.0)
Platelets: 147 10*3/uL (ref 140–400)
RBC: 4.75 10*6/uL (ref 4.20–5.82)
RDW: 16.9 % — ABNORMAL HIGH (ref 11.0–14.6)
WBC: 9.5 10*3/uL (ref 4.0–10.3)

## 2014-07-19 MED ORDER — FLUTICASONE-SALMETEROL 500-50 MCG/DOSE IN AEPB: 1.0000 | INHALATION_SPRAY | Freq: Two times a day (BID) | RESPIRATORY_TRACT | Status: DC

## 2014-07-19 MED ORDER — IOHEXOL 300 MG/ML  SOLN
80.0000 mL | Freq: Once | INTRAMUSCULAR | Status: AC | PRN
  Administered 2014-07-19: 80 mL via INTRAVENOUS

## 2014-07-19 MED ORDER — FLUTICASONE-SALMETEROL 500-50 MCG/DOSE IN AEPB: INHALATION_SPRAY | RESPIRATORY_TRACT | Status: DC

## 2014-07-19 NOTE — Assessment & Plan Note (Signed)
Continues compliant with CPAP 10/[employer]

## 2014-07-19 NOTE — Progress Notes (Signed)
Patient ID: Ernest [name], male    DOB: Mar 07, 1956, 58 y.o.   MRN: [id number]  HPI 07/30/11- 44 yoM smoker followed for COPD/ bronchitis, OSA/ CPAP Last here 04/01/11- note reviewed. He had felt better on a trial of maintenance prednisone. He has felt more short of breath through the hot summer, with no acute event and without blood, chest pain or fever. Easy DOE around home. Has been sharing nebulizer with his wife, who now has a cold and we discussed this. Has been worse in last month, and expecially in recent rainy weather. Noting a little ankle swelling. Smothered trying to take a shower.  Only tolerating his CPAP about 3 nights/ week because he feels smothered, waking sore in anterior chest in the mornings.  Now down to only 2-3 cigarettes/ day and trying to stop.  CXR 08/14/10- Nl NAD PFT 05/26/08- FEV1/FVC 2.44/ 0.76, incr 19% w/ BD, R 0.59 ?DLCO 0.69  09/10/11- 55 yoM smoker followed for COPD/ bronchitis, OSA/ CPAP He reports feeling better "finally doing okay". He is down to just a couple of cigarettes a day. He is using CPAP regularly now and is comfortable with it. Alpha I antitrypsin Report came back normal "M. M." on 08/09/2011. Chest x-ray from 08/15/2011 showed stable changes of COPD. He has had flu vaccine.  11/12/11- 19 yoM smoker followed for COPD/ bronchitis, OSA/ CPAP Here with wife. Has had flu shot. He says he was doing well until 2 weeks ago. He again is coughing thick mucus which is difficult to clear. He depends on his metered inhaler and nebulizer to help clear his airways. Started Mucinex 3 days ago. Mucus is white. He denies fever, sore throat, chest pain, GI upset. Because of orthopnea he is sitting up to sleep for the past 2 nights.  01/14/12- 55 yoM smoker followed for COPD/ bronchitis, OSA/ CPAP Increase congested cough green to yellow sputum but he does not feel sick and denies fever. Sister who was a smoker recently died of lung cancer. Despite that and all of our  discussions, he still smokes a few cigarettes daily. We talked again about motivation to stop completely. Continues CPAP and Provigil one or 2 daily.  03/10/12  56 yoM smoker followed for COPD/ bronchitis, OSA/ CPAP Persistent bronchitic cough. Sputum is not purulent. He is "working on" his smoking habit but blames pollen for keeping him S. coughing now. Remains fully compliant with CPAP, all night every night. Still needs Provigil to help with residual daytime sleepiness as before.  05/12/12- 56 yoM smoker followed for COPD/ bronchitis, OSA/ CPAP Stays SOB all the time-even at rest; wheezing, coughing-productive-yellow in color, chest congestion, nasal congestion,. He is more interested today in smoking cessation which we discussed. He had quit for Daliresp after 10 days because of sustained nausea. He is taking all of his regular medicines and using his rescue inhaler several times a day. Off prednisone now for 1-2 months. CXR 01/23/12-  IMPRESSION:  No acute cardiopulmonary abnormality.  Original Report Authenticated By: Randall An, M.D.   07/13/12- 1 yoM smoker followed for COPD/ bronchitis, OSA/ CPAP, complicated by CAD Pt states increased sob,wheezing,productive cough x3 months. Still smoking with no serious effort to stop. His sister has lung cancer which has gotten his attention and he may be willing to try harder. Some days his breathing is better than others with no real trend. Coughing productive of white sputum with no blood. He has all of his medications currently.  07/30/12- 62 yoM  smoker followed for COPD/ bronchitis, OSA/ CPAP, complicated by CAD Cough-productive-yellow and thick; SOB, wheezing, chest congestion, head congestion x 1 week; O2 sat of 83% RA entered room-20 seconds RA sat of 90% CT 07/21/12-  IMPRESSION:  1. Tiny bilateral lung nodules which are similar to on the prior  exam, given differences in slice selection and measurement error.  Likely subpleural lymph  nodes.  2. Age advanced coronary artery atherosclerosis. Recommend  assessment of coronary risk factors and consideration of medical  therapy.  3. Slight enlargement of splenic lesion since 2007. Favored to  represent a hemangioma or lymphangioma.  Original Report Authenticated By: Areta Haber, M.D.   08/07/12- 50 yoM smoker followed for COPD/ bronchitis, OSA/ CPAP, complicated by CAD, disability for back pain. Cough-productive-white mostly; SOB , wheezing-worse at night     wife here Coughing spasms he can feel his upper airway is closing off. He he still smokes a little even when he feels badly and we went over this again today offering support.  11/13/12- 56 yoM smoker followed for COPD/ bronchitis, OSA/ CPAP, complicated by CAD, disability for back pain. FOLLOWS AST:MHDQQ still but not productive-more SOB  Went to ER in September or acute exacerbation of COPD but not admitted. Today he is a scheduled visit. He actually says he is doing pretty well with no acute problems. Perennial nasal congestion. He has reduced his cigarette smoking to about 2 per day and is strongly encouraged to stop completely now.  01/25/13-  57 yoM smoker followed for COPD/ bronchitis, OSA/ CPAP, complicated by CAD, disability for back pain. ACUTE VISIT: started smoking agian-discuss taking Chantix; feels raw in chest area with deep cough x 2 weeks. He feels well at this visit, meaning he is at his baseline some daily nonproductive cough and shortness of breath with exertion.  05/17/13-57 yoM smoker followed for COPD/ bronchitis, OSA/ CPAP, complicated by CAD, disability for back pain. FOLLOWS IWL:NLGXQJJHE is about the same as last visit; has lessened the about the amount he is smoking. Chantix helps. Continues CPAP/ 10/[facility] Discussed smoking cessation effort.  06/10/13- 57 yoM smoker followed for COPD/ bronchitis, OSA/ CPAP, complicated by CAD, disability for back pain. ACUTE VISIT: prod cough with yellow/green  mucus, increased SOB, wheezing, tightness in chest, chills x1 week.  still on augmentin and pred taper from 7/15 phone note > no better Trying Chantix. Wife is also sick. There exposed to his brother's child who has frequent colds. Just started prednisone taper and Augmentin yesterday. Had teeth pulled.  09/16/13- 57 yoM smoker followed for COPD/ bronchitis, OSA/ CPAP, complicated by CAD, disability for back pain. FOLLOWS FOR: was given the flu shot about 1 month and has not felt good since; continues to keep a headache, chest congestion at night. Describes frontal headache, head and chest congestion since a flu shot. Using Chantix, has reduced smoking to 2 or 3 cigarettes daily. Denies infection-no fever, sputum not purulent or bloody. No chest pain. Stays on Mucinex. Not using CPAP because of head congestion. Sister has died of lung cancer/smoker.  2013/11/04- 57 yoM smoker followed for COPD/ bronchitis, OSA/ CPAP, SqCell CA LUL,complicated by CAD, disability for back pain. FOLLOWS FOR: review bx results with patient; continues to have bad frontal lobe headache and lots of congestion. Nausea last night. No vomiting. Abnl CXR > CT chest/ large LUL mass to hilum. We had notified pt and scheduled bx. He is not coughing up much, denies chest pain. New headache mostly left frontal and  retro-orbital x 1 month with no lateralizing neuro. Remains tight on routine meds, off prednisone.  His wife blames "anxiety" for ER trip/ nebulizer the night before his bronchoscopy. Sister recently died of small cell Ca lung.  CT chest 09/17/13- IMPRESSION:  1. 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 This is associated with  some postobstructive changes in the left upper lobe as well as left  hilar and AP window lymphadenopathy. Based on these findings, and  assuming lack of distant metastatic disease (which will have to be  proven by other imaging  studies), this is favored to represent at  least T2b (if not T3 because of satellite nodules in the left upper  lobe), N2, Mx disease (i.e., at least stage IIIA disease). Further  evaluation with PET-CT and brain MRI with and without IV gadolinium  is suggested at this time for complete staging.  2. Mild centrilobular and paraseptal emphysema.  3. Atherosclerosis, including 3 vessel coronary artery disease.  Assessment for potential risk factor modification, dietary therapy  or pharmacologic therapy may be warranted, if clinically indicated.  4. Additional incidental findings, as above.  Electronically Signed  By: Vinnie Langton M.D.  On: 09/22/2013 11:34  Bronch / needle bx 10/04/13- by Dr Lamonte Sakai Pos Squamous Cell Ca  1/23//15- 57 yoM former smoker followed for COPD/ bronchitis, OSA/ CPAP, SqCell CA LUL/ XRT/Chemo, complicated by CAD, disability for back pain. Myocardial Perfusion 11/19/13- EF 65%, Nl wall motion, no ischemic changes. Follows for: frontal lobe headaches have gone away, SOB constantly, some prod cough with white/clear mucous.  Not smoking.  Nearing completion of current rounds of Chemo/ XRT.  Minor sore throat, breathing ok- some SOB, dry cough.  No recent steroids or ABX except Advair.  Continues CPAP 10/ [facility] with no problem.   01/06/13- 57 yoM former smoker followed for COPD/ bronchitis, OSA/ CPAP, SqCell CA LUL/ XRT/Chemo, complicated by CAD, disability for back pain. ACUTE VISIT:  Increase sob, wheezing and cough with congestion head and chest. Also, has bodyaches and sweats Acute illness x1 week started as head congestion moving to his chest with fever, white sputum turning yellow, myalgias. No blood. Had had flu shot. Has completed radiation and chemotherapy pending next oncology followup. Admits smoking 2 cigarettes since last here.  03/02/14- 78 yoM some-time smoker followed for COPD/ bronchitis, OSA/ CPAP, SqCell CA LUL/ XRT/Chemo, complicated by CAD, disability  for back pain. FOLLOWS FOR: Post hospital; Pt states that continues to have congestion, wheezing, and having left sided chest pain this morning. Had reaction to chemo yesterday-started having trouble breathing and hot flashes-was given breathing tx and injection. Hospitalized in with pneumonia, COPD with asthma 2 weeks ago. CT chest 01/28/2014 showed his left upper lobe mass to be cavitating. Yesterday he had reaction to chemotherapy requiring nebulizer treatment and injection. More dyspnea on exertion at baseline, gradually worse over the past month. Coughing clear phlegm. Nebulizer helps temporarily. This morning head left upper anterior lateral chest pain which is gradually fading. He admits he is smoking some-discussed. Continues CPAP 10/[facility] CXR 02/15/14 IMPRESSION:  No acute findings and no change from the study performed earlier  this same day.  Electronically Signed  By: Lajean Manes M.D.  On: 02/15/2014 09:23  04/19/14- 57 yoM some-time smoker followed for COPD/ bronchitis, OSA/ CPAP, SqCell CA LUL/ XRT/Chemo, complicated by CAD, disability for back pain, DM. FOLLOWS FOR: Breathing is terrible. Recently had PNA-Dr Yoo-still on abx  for this. CPAP 10/ O2 2L/ [facility] for sleep Walk test today 3x 180 m on room air- lowest sat was 92%. Scheduled for 3 more days of Levaquin for pneumonia. Improving significantly now. Cough is productive white. CT chest 04/12/14 IMPRESSION:  1. Favor response to therapy of a residual cavitary left upper lobe  lung lesion. Decreased size of the cavitary component with increased  surrounding soft tissue thickening, favored to be treatment related.  Similarly, evolving radiation change within the surrounding left  upper lobe and left apex. The apical presumed radiation change  obscures the previously described left apical pulmonary nodule.  Recommend attention on follow-up.  2. Similar borderline prevascular adenopathy.  3. Resolved left-sided pleural effusion  with trace left pleural  fluid remaining.  4. Minimal left lower lobe nodularity which could be infectious or  inflammatory. Recommend attention on follow-up.  5. Pulmonary artery enlargement suggests pulmonary arterial  hypertension.  6. Incompletely imaged similar cystic/septated splenic lesion. Favor  a benign lesion such as a lymphangioma.  Electronically Signed  By: Abigail Miyamoto M.D.  On: 04/12/2014 13:57  07/19/14- 58 yoM some-time smoker followed for COPD/ bronchitis, OSA/ CPAP, SqCell CA LUL/ XRT/Chemo, complicated by CAD, disability for back pain, DM.    Wife here FOLLOWS FOR: has had choking cough since last here-productive and clear in color. SOB and wheezing. He says cough is not new and not changed. No blood. Occasional localized left parasternal pain lasts for a day or 2 at a time. He is using rescue inhaler too frequently and we discussed use of maintenance controller and nebulizer machine more appropriately. Takes occasional Nuvigil but still needs to nap. Continues Nexium without recognizing much heartburn.  Review of Systems- see HPI Constitutional:   No-   weight loss, night sweats, +fevers, chills,+ fatigue, lassitude. HEENT:  No- headaches, difficulty swallowing, tooth/dental problems, sore throat,       No-  sneezing, itching, ear ache, nasal congestion, post nasal drip,  CV:  + atypical chest pain, orthopnea, PND,  Little swelling in lower extremities,  No-dizziness,                    palpitations Resp: +  shortness of breath with exertion or at rest.             + productive cough,  + non-productive cough,  No-  coughing up of blood.              No-change in color of mucus.  +some wheezing.   Skin: No-   rash or lesions. GI:  No-   heartburn, indigestion, abdominal pain, nausea, vomiting, GU:  MS:  + pain right knee after a fall   Neuro- nothing unusual  Psych:  No- change in mood or affect. No depression or anxiety.  No memory loss.    Objective:   Physical  Exam General- Alert, Oriented, Affect-appropriate, Distress- none acute   Stocky/ overweight,  Skin- rash-none, lesions- none, excoriation- none.      Lymphadenopathy- none Head- atraumatic            Eyes- Gross vision intact, +PERRLA, conjunctivae- injected. + peri-orbital edema            Ears- Hearing, canals- normal            Nose- +turbinate edema, No- Septal dev, +mucus bridging, no-polyps, erosion,  No-perforation             Throat- Mallampati III , +tongue coated/ thrush, drainage- none, tonsils- atrophic, Neck- flexible , trachea midline, no stridor , thyroid nl, carotid no bruit Chest - symmetrical excursion , unlabored           Heart/CV- RRR , no murmur , no gallop  , no rub, nl s1 s2                           - JVD- none , edema-none, stasis changes- none, varices- none           Lung- cough-none, unlabored,  Wheeze+ Mild, dullness-none, rub-none                                            Chest wall-  Abd-  Br/ Gen/ Rectal- Not done, not indicated Extrem- cyanosis- none, clubbing, none, atrophy- none, strength- nl. 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 Neuro- grossly intact to observation

## 2014-07-19 NOTE — Assessment & Plan Note (Signed)
No acute exacerbation. We reviewed medications and removed Arcapta and theophylline from his list, not being used Plan-increase Advair to 500, use nebulizer up to 4 times daily if needed

## 2014-07-19 NOTE — Patient Instructions (Signed)
Sample and printed script for Advair 500   1 puff then rinse mouth, twice daily maintenance  You can use the nebulizer up to 4 times daily. See if this allows you to use your rescue inhaler less.

## 2014-07-20 ENCOUNTER — Other Ambulatory Visit: Payer: Self-pay

## 2014-07-20 MED ORDER — METOPROLOL SUCCINATE ER 25 MG PO TB24: ORAL_TABLET | ORAL | Status: DC

## 2014-07-21 ENCOUNTER — Telehealth: Payer: Self-pay | Admitting: Internal Medicine

## 2014-07-21 ENCOUNTER — Encounter: Payer: Self-pay | Admitting: Internal Medicine

## 2014-07-21 ENCOUNTER — Ambulatory Visit (HOSPITAL_BASED_OUTPATIENT_CLINIC_OR_DEPARTMENT_OTHER): Payer: Commercial Managed Care - HMO | Admitting: Internal Medicine

## 2014-07-21 VITALS — BP 128/62 | HR 67 | Temp 98.2°F | Resp 18 | Ht 65.5 in | Wt 182.4 lb

## 2014-07-21 DIAGNOSIS — C3410 Malignant neoplasm of upper lobe, unspecified bronchus or lung: Secondary | ICD-10-CM

## 2014-07-21 DIAGNOSIS — I2510 Atherosclerotic heart disease of native coronary artery without angina pectoris: Secondary | ICD-10-CM

## 2014-07-21 DIAGNOSIS — C3492 Malignant neoplasm of unspecified part of left bronchus or lung: Secondary | ICD-10-CM

## 2014-07-21 DIAGNOSIS — J449 Chronic obstructive pulmonary disease, unspecified: Secondary | ICD-10-CM

## 2014-07-21 NOTE — Telephone Encounter (Signed)
gv and printed appt sched and avs for pt fro NOV adn Dec..[name]

## 2014-07-21 NOTE — Progress Notes (Signed)
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  PRIOR THERAPY:  1) Concurrent chemoradiation with weekly carboplatin for AUC of 2 and paclitaxel 45 mg/M2, status post 7 cycles, last dose was given 12/20/2013. First dose on 11/01/2013. 2) Consolidation chemotherapy with carboplatin for AUC of 5 and paclitaxel 175 mg/M2 every 3 weeks with Neulasta support. First cycle on 02/08/2014. Status post 3 cycles and carboplatin was discontinued secondary to allergic reaction.    CURRENT THERAPY: Observation  CHEMOTHERAPY INTENT: Curative/control  CURRENT # OF CHEMOTHERAPY CYCLES: 0  CURRENT ANTIEMETICS: Zofran, dexamethasone and Compazine  CURRENT SMOKING STATUS: Former smoker  ORAL CHEMOTHERAPY AND CONSENT: None  CURRENT BISPHOSPHONATES USE: None  PAIN MANAGEMENT: 0/10  NARCOTICS INDUCED CONSTIPATION: None.  LIVING WILL AND CODE STATUS: Full code.   INTERVAL HISTORY: Ernest [name] 58 y.o. male returns to the clinic today for followup visit accompanied by his wife. The patient is feeling much better these days with no specific complaints except for shortness breath with exertion and mild dry cough. He gained around 6 pounds since his last visit. He denied having any significant chest pain or hemoptysis. He denied having any nausea or vomiting, no fever or chills. The patient denied having any current peripheral neuropathy. He had repeat CT scan of the chest performed recently and he is here for evaluation and discussion of his scan results.  MEDICAL HISTORY: Past Medical History  Diagnosis Date  . CAD (coronary artery disease)     Left Main 30% stenosis, LAD 20 -  30 % stenosis, first and second diagonal branchesat 40 - 50%  stenosis with small arteries, circumflex had 30% stenosis in the large obtuse marginal, RCA at 70 - 80%  stenosis [not felt to be occlusive after evaluation with flow wire], distal 50 - 60% stenosis - [name][  . COPD (chronic obstructive pulmonary disease)     Dr. Baird Lyons  . Depression   . Anxiety   . Hyperlipidemia   . Chronic insomnia   . Gout   . GERD (gastroesophageal reflux disease)   . PVD (peripheral vascular disease)     (ABI 0.9 on right and 0.89 on left)  severe left external iliac stenosis.  He  had successful stenting of his left external iliac per Dr. Burt Knack.  . DDD (degenerative disc disease)   . Hx of colonoscopy   . 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 OSA (obstructive sleep apnea)     NPSG 09/10/10- AHI 11.3/hr  . Hypertension     dr Percival Spanish  . History of radiation therapy 11/10/13- 12/29/13    left lung 6600 cGy in 33 sessions  . Cancer     lung  . Lung cancer 10/04/13    LUL squamous cell lung cancer    ALLERGIES:  is allergic to carboplatin.  MEDICATIONS:  Current Outpatient Prescriptions  Medication Sig Dispense Refill  . ACCU-CHEK SOFTCLIX LANCETS lancets Use as instructed  100 each  12  . albuterol (PROVENTIL HFA) 108 (90 BASE) MCG/ACT inhaler 2 puffs every 4 hours if needed  2 Inhaler  prn  . albuterol (PROVENTIL) (2.5 MG/3ML) 0.083% nebulizer solution Take 3 mLs (2.5 mg total) by nebulization every 4 (four) hours  as needed for wheezing or shortness of breath.  360 mL  1  . ALPRAZolam (XANAX) 0.25 MG tablet Take 0.25 mg by mouth daily as needed.      . ARIPiprazole (ABILIFY) 5 MG tablet Take 1 tablet (5 mg total) by mouth at bedtime.      . Armodafinil (NUVIGIL) 150 MG tablet Take 150 mg by mouth daily.      . clonazePAM (KLONOPIN) 1 MG tablet Take 1 mg by mouth at bedtime as needed. For anxiety/insomnia      . esomeprazole (NEXIUM) 40 MG capsule Take 40 mg by mouth daily.      [name]  FLUoxetine (PROZAC) 40 MG capsule Take 40 mg by mouth daily.        . fluticasone (FLONASE) 50 MCG/ACT nasal spray Place 2 sprays into both nostrils daily.  16 g  3  . Fluticasone-Salmeterol (ADVAIR DISKUS) 500-50 MCG/DOSE AEPB 1 puff and rinse mouth, twice daily  60 each  prn  . gabapentin (NEURONTIN) 100 MG capsule Take 100 mg by mouth daily.       [name] glucose blood (ACCU-CHEK AVIVA PLUS) test strip Use as instructed  100 each  12  . Insulin Glargine (LANTUS SOLOSTAR) 100 UNIT/ML Solostar Pen 5 units at bedtime.  5 pen  PRN  . Insulin Pen Needle (BD PEN NEEDLE NANO U/F) 32G X 4 MM MISC 1 each by Does not apply route daily.  100 each  3  . isosorbide mononitrate (IMDUR) 30 MG 24 hr tablet TAKE 1 TABLET (30 MG TOTAL) BY MOUTH DAILY.  30 tablet  1  . metoprolol succinate (TOPROL-XL) 25 MG 24 hr tablet TAKE 1/2 TABLET BY MOUTH DAILY  7 tablet  0  . mirtazapine (REMERON) 15 MG tablet Take 22.5 mg by mouth at bedtime.      [name] morphine (MS CONTIN) 60 MG 12 hr tablet Take 60 mg by mouth 2 (two) times daily.        . Omega-3 Fatty Acids (FISH OIL CONCENTRATE) 1000 MG CAPS Take 1,000 mg by mouth daily.       . potassium chloride SA (K-DUR,KLOR-CON) 20 MEQ tablet Take 20 mEq by mouth daily.      . prochlorperazine (COMPAZINE) 10 MG tablet TAKE 1 TABLET BY MOUTH EVERY 6 HOURS AS NEEDED FOR NAUSEA AND VOMITING  60 tablet  0  . VOLTAREN 1 % GEL Apply 2 g topically daily as needed (for pain).       . methocarbamol (ROBAXIN) 500 MG tablet Take 500 mg by mouth 2 (two) times daily as needed for muscle spasms.       . nitroGLYCERIN (NITROSTAT) 0.4 MG SL tablet Place 1 tablet (0.4 mg total) under the tongue every 5 (five) minutes as needed for chest pain.  25 tablet  3   No current facility-administered medications for this visit.    SURGICAL HISTORY:  Past Surgical History  Procedure Laterality Date  . Spinal fusion  03/05/2007    L4-L5  . Hip surgery      left  . Arm surgery      left  . Shoulder surgery       right  . C-spine surgery    . Angioplasty    . Video bronchoscopy with endobronchial navigation N/A 10/04/2013    Procedure: VIDEO BRONCHOSCOPY WITH ENDOBRONCHIAL NAVIGATION;  Surgeon: Collene Gobble, MD;  Location: [facility];  Service: Thoracic;  Laterality: N/A;  . Back surgery  REVIEW OF SYSTEMS:  A comprehensive review of systems was negative except for: Respiratory: positive for cough and dyspnea on exertion   PHYSICAL EXAMINATION: General appearance: alert, cooperative and no distress Head: Normocephalic, without obvious abnormality, atraumatic Neck: no adenopathy, no JVD, supple, symmetrical, trachea midline and thyroid not enlarged, symmetric, no tenderness/mass/nodules Lymph nodes: Cervical, supraclavicular, and axillary nodes normal. Resp: wheezes bilaterally Back: symmetric, no curvature. ROM normal. No CVA tenderness. Cardio: regular rate and rhythm, S1, S2 normal, no murmur, click, rub or gallop GI: soft, non-tender; bowel sounds normal; no masses,  no organomegaly Extremities: extremities normal, atraumatic, no cyanosis or edema Neurologic: Alert and oriented X 3, normal strength and tone. Normal symmetric reflexes. Normal coordination and gait  ECOG PERFORMANCE STATUS: 1 - Symptomatic but completely ambulatory  Blood pressure 128/62, pulse 67, temperature 98.2 F (36.8 C), temperature source Oral, resp. rate 18, height 5' 5.5" (1.664 m), weight 182 lb 6.4 oz (82.736 kg).  LABORATORY DATA: Lab Results  Component Value Date   WBC 9.5 07/19/2014   HGB 13.2 07/19/2014   HCT 40.7 07/19/2014   MCV 85.7 07/19/2014   PLT 147 07/19/2014      Chemistry      Component Value Date/Time   NA 140 07/19/2014 0914   NA 136 07/18/2014 1558   K 4.0 07/19/2014 0914   K 4.3 07/18/2014 1558   CL 98 07/18/2014 1558   CO2 29 07/19/2014 0914   CO2 30 07/18/2014 1558   BUN 9.8 07/19/2014 0914   BUN 10 07/18/2014 1558   CREATININE 0.8 07/19/2014 0914   CREATININE 0.8 07/18/2014 1558        Component Value Date/Time   CALCIUM 9.6 07/19/2014 0914   CALCIUM 9.2 07/18/2014 1558   ALKPHOS 140 07/19/2014 0914   ALKPHOS 125* 07/18/2014 1558   AST 15 07/19/2014 0914   AST 21 07/18/2014 1558   ALT 13 07/19/2014 0914   ALT 16 07/18/2014 1558   BILITOT 0.37 07/19/2014 0914   BILITOT 0.3 07/18/2014 1558       RADIOGRAPHIC STUDIES: Ct Chest W Contrast  07/19/2014   CLINICAL DATA:  Lung cancer diagnosed December 2014. Completed chemotherapy and radiation therapy. Shortness of breath.  EXAM: CT CHEST WITH CONTRAST  TECHNIQUE: Multidetector CT imaging of the chest was performed during intravenous contrast administration.  CONTRAST:  49mL OMNIPAQUE IOHEXOL 300 MG/ML  SOLN  COMPARISON:  04/12/2014  FINDINGS: Left upper lobe near total collapse and internal masslike consolidation is reidentified. Previously seen cavitary component measures slightly smaller at 1.6 x 1.3 cm image 15. Left upper lobe bronchiectasis is noted. Linear reticular opacity in the left perihilar region is stable, compatible with radiation change. Left lower lobe 5 mm nodule with linear extension to the pleural surface is noted. No new pulmonary nodule, mass, or consolidation is identified. Emphysematous changes are again noted. Central airways are patent. Bilateral central bronchial wall thickening is noted.  Cervical fusion hardware partly visualized. No lytic or sclerotic osseous lesion.  A 0.7 cm AP window lymph node is reidentified image 26. Heart size is normal. No pericardial or pleural effusion. Periaortic node measures 0.6 cm image 35.  Incomplete imaging of the upper abdomen re- demonstrates a partly visualized low-density splenic lesion most likely hemangioma or lymphangioma. Adrenal glands appear normal.  IMPRESSION: Decreased size of cavitary component of left upper lobe mass lesion corresponding to the reported history of the lung cancer. The degree of adjacent volume loss and masslike consolidation of the left upper  lobe is  increased since previously which may reflect treatment effect secondary to radiation fibrosis. This is amenable to followup and continued surveillance.  No new evidence for intrathoracic metastatic disease.   Electronically Signed   By: Conchita Paris M.D.   On: 07/19/2014 14:06   ASSESSMENT AND PLAN: This is a very pleasant 58 years old white male with stage IIIA non-small cell lung cancer, squamous cell carcinoma currently undergoing concurrent chemoradiation with weekly carboplatin and paclitaxel status post 7 weeks of treatment. He tolerated his treatment fairly well with no significant adverse effects. This was followed by consolidation chemotherapy with carboplatin and paclitaxel status post 3 cycles, .carboplatin was discontinued after cycle #2 secondary to hypersensitivity reaction. His recent CT scan of the chest showed decrease in the cavitary component of the left upper lobe lung mass with stable disease otherwise. I discussed the scan results with the patient and his wife. They expressed their hope that the cancer would have disappeared by now. I explained to the patient and his wife that this is unlikely at this point and we will continue close monitoring to evaluate for any progression of his disease. I recommended for him to continue on observation with repeat CT scan of the chest in 3 months. He was advised to call immediately if he has any concerning symptoms in the interval. The patient voices understanding of current disease status and treatment options and is in agreement with the current care plan.  All questions were answered. The patient knows to call the clinic with any problems, questions or concerns. We can certainly see the patient much sooner if necessary.  Disclaimer: This note was dictated with voice recognition software. Similar sounding words can inadvertently be transcribed and may not be corrected upon review.

## 2014-07-28 ENCOUNTER — Other Ambulatory Visit: Payer: Self-pay | Admitting: Internal Medicine

## 2014-07-28 DIAGNOSIS — C3490 Malignant neoplasm of unspecified part of unspecified bronchus or lung: Secondary | ICD-10-CM

## 2014-08-09 ENCOUNTER — Encounter: Payer: Self-pay | Admitting: Cardiology

## 2014-08-09 ENCOUNTER — Ambulatory Visit (INDEPENDENT_AMBULATORY_CARE_PROVIDER_SITE_OTHER): Payer: Commercial Managed Care - HMO | Admitting: Cardiology

## 2014-08-09 VITALS — BP 122/80 | HR 63 | Ht 65.0 in | Wt 181.6 lb

## 2014-08-09 DIAGNOSIS — R0600 Dyspnea, unspecified: Secondary | ICD-10-CM

## 2014-08-09 DIAGNOSIS — R918 Other nonspecific abnormal finding of lung field: Secondary | ICD-10-CM

## 2014-08-09 DIAGNOSIS — R0609 Other forms of dyspnea: Secondary | ICD-10-CM

## 2014-08-09 DIAGNOSIS — R222 Localized swelling, mass and lump, trunk: Secondary | ICD-10-CM

## 2014-08-09 DIAGNOSIS — I739 Peripheral vascular disease, unspecified: Secondary | ICD-10-CM

## 2014-08-09 DIAGNOSIS — R0989 Other specified symptoms and signs involving the circulatory and respiratory systems: Secondary | ICD-10-CM | POA: Diagnosis not present

## 2014-08-09 DIAGNOSIS — I2510 Atherosclerotic heart disease of native coronary artery without angina pectoris: Secondary | ICD-10-CM

## 2014-08-09 DIAGNOSIS — I10 Essential (primary) hypertension: Secondary | ICD-10-CM

## 2014-08-09 LAB — BRAIN NATRIURETIC PEPTIDE: BRAIN NATRIURETIC PEPTIDE: 35.5 pg/mL (ref 0.0–100.0)

## 2014-08-09 MED ORDER — NITROGLYCERIN 0.4 MG SL SUBL: 0.4000 mg | SUBLINGUAL_TABLET | SUBLINGUAL | Status: DC | PRN

## 2014-08-09 MED ORDER — METOPROLOL SUCCINATE ER 25 MG PO TB24: ORAL_TABLET | ORAL | Status: DC

## 2014-08-09 MED ORDER — ISOSORBIDE MONONITRATE ER 30 MG PO TB24: 30.0000 mg | ORAL_TABLET | Freq: Every day | ORAL | Status: DC

## 2014-08-09 MED ORDER — ISOSORBIDE MONONITRATE ER 30 MG PO TB24
30.0000 mg | ORAL_TABLET | Freq: Every day | ORAL | Status: DC
Start: 2014-08-09 — End: 2014-09-08

## 2014-08-09 NOTE — Progress Notes (Signed)
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 He is currently undergoing chemoradiation.   The patient presents for follow of known coronary disease.  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 He has been having chest discomfort. He did have a stress perfusion study at the end of last year for chest discomfort which was negative for any evidence of ischemia and showed a well preserved ejection fraction. He says he is having a somewhat  Different discomfort however now.  He reports that this is actually about one time per week. It happens at rest. It might last all day. It feels like somebody punched him. He's not describing radiation to his arm or jaw. He's not describing associated symptoms. He does have increasing shortness of breath walking 100 feet on level ground.   Allergies  Allergen Reactions  . Carboplatin Shortness Of Breath, Swelling and Rash    Swelling of lips, rash on face,eyes and head    Current Outpatient Prescriptions  Medication Sig Dispense Refill  . albuterol (PROVENTIL HFA) 108 (90 BASE) MCG/ACT inhaler 2 puffs every 4 hours if needed  2 Inhaler  prn  . albuterol (PROVENTIL) (2.5 MG/3ML) 0.083% nebulizer solution Take 3 mLs (2.5 mg total) by nebulization every 4 (four) hours as needed for wheezing or shortness of breath.  360 mL  1  . ALPRAZolam (XANAX) 0.25 MG tablet Take 0.25 mg by mouth daily as needed.      . ARIPiprazole (ABILIFY) 5 MG tablet Take 1 tablet (5 mg total) by mouth at bedtime.      . Armodafinil (NUVIGIL) 150 MG tablet Take 150 mg by mouth daily.      . clonazePAM (KLONOPIN) 1 MG tablet Take 1 mg by mouth at bedtime as needed. For anxiety/insomnia      . esomeprazole (NEXIUM) 40 MG capsule Take 40 mg by mouth daily.       [name] FLUoxetine (PROZAC) 40 MG capsule Take 40 mg by mouth daily.        . fluticasone (FLONASE) 50 MCG/ACT nasal spray Place 2 sprays into both nostrils daily.  16 g  3  . Fluticasone-Salmeterol (ADVAIR DISKUS) 500-50 MCG/DOSE AEPB 1 puff and rinse mouth, twice daily  60 each  prn  . gabapentin (NEURONTIN) 100 MG capsule Take 100 mg by mouth daily.       . Insulin Glargine (LANTUS SOLOSTAR) 100 UNIT/ML Solostar Pen 5 units at bedtime.  5 pen  PRN  . Insulin Pen Needle (BD PEN NEEDLE NANO U/F) 32G X 4 MM MISC 1 each by Does not apply route daily.  100 each  3  . isosorbide mononitrate (IMDUR) 30 MG 24 hr tablet TAKE 1 TABLET (30 MG TOTAL) BY MOUTH DAILY.  30 tablet  1  . methocarbamol (ROBAXIN) 500 MG tablet Take 500 mg by mouth 2 (two) times daily as needed for muscle spasms.       . metoprolol succinate (TOPROL-XL) 25 MG 24 hr tablet TAKE 1/2 TABLET BY MOUTH DAILY  7 tablet  0  . mirtazapine (REMERON) 15 MG tablet Take 22.5 mg by mouth at bedtime.      [name] morphine (MS CONTIN) 60 MG 12 hr tablet Take 60 mg by mouth 2 (two) times daily.        [name]  nitroGLYCERIN (NITROSTAT) 0.4 MG SL tablet Place 1 tablet (0.4 mg total) under the tongue every 5 (five) minutes as needed for chest pain.  25 tablet  3  . Omega-3 Fatty Acids (FISH OIL CONCENTRATE) 1000 MG CAPS Take 1,000 mg by mouth daily.       [name] oxyCODONE-acetaminophen (PERCOCET) 10-325 MG per tablet Take 1 tablet by mouth as needed.      . potassium chloride SA (K-DUR,KLOR-CON) 20 MEQ tablet Take 20 mEq by mouth daily.      . prochlorperazine (COMPAZINE) 10 MG tablet TAKE 1 TABLET BY MOUTH EVERY 6 HOURS AS NEEDED FOR NAUSEA AND VOMITING  60 tablet  3  . VOLTAREN 1 % GEL Apply 2 g topically daily as needed (for pain).        No current facility-administered medications for this visit.    Past Medical History  Diagnosis Date  . CAD (coronary artery disease)     Left Main 30% stenosis, LAD 20 - 30 % stenosis, first and second diagonal branchesat  40 - 50%  stenosis with small arteries, circumflex had 30% stenosis in the large obtuse marginal, RCA at 70 - 80%  stenosis [not felt to be occlusive after evaluation with flow wire], distal 50 - 60% stenosis - Clem Wisenbaker[  . COPD (chronic obstructive pulmonary disease)     Dr. Baird Lyons  . Depression   . Anxiety   . Hyperlipidemia   . Chronic insomnia   . Gout   . GERD (gastroesophageal reflux disease)   . PVD (peripheral vascular disease)     (ABI 0.9 on right and 0.89 on left)  severe left external iliac stenosis.  He  had successful stenting of his left external iliac per Dr. Burt Knack.  . DDD (degenerative disc disease)   . Hx of colonoscopy   . 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 OSA (obstructive sleep apnea)     NPSG 09/10/10- AHI 11.3/hr  . Hypertension     dr Percival Spanish  . History of radiation therapy 11/10/13- 12/29/13    left lung 6600 cGy in 33 sessions  . Cancer     lung  . Lung cancer 10/04/13    LUL squamous cell lung cancer    Past Surgical History  Procedure Laterality Date  . Spinal fusion  03/05/2007    L4-L5  . Hip surgery      left  . Arm surgery      left  . Shoulder surgery      right  . C-spine surgery    . Angioplasty    . Video bronchoscopy with endobronchial navigation N/A 10/04/2013    Procedure: VIDEO BRONCHOSCOPY WITH ENDOBRONCHIAL NAVIGATION;  Surgeon: Collene Gobble, MD;  Location: [facility];  Service: Thoracic;  Laterality: N/A;  . Back surgery      ROS:  Hoarseness.  Otherwise as stated in the HPI and negative for all other systems.  PHYSICAL EXAM BP 122/80  Pulse 63  Ht 5\' 5"  (1.651 m)  Wt 181 lb 9.6 oz (82.373 kg)  BMI 30.22 kg/m2 GENERAL:  Well appearing HEENT:  Pupils equal round and reactive, fundi not visualized, oral mucosa unremarkable, dentures NECK:  No jugular venous distention, waveform within normal limits, carotid upstroke brisk and symmetric, no bruits, no thyromegaly LYMPHATICS:  No cervical, inguinal adenopathy LUNGS:   Decreased breath sounds bilaterally BACK:  No CVA tenderness CHEST:  Unremarkable HEART:  PMI not displaced or sustained,S1 and S2 within normal limits, no S3,  no S4, no clicks, no rubs, no murmurs ABD:  Flat, positive bowel sounds normal in frequency in pitch, no bruits, no rebound, no guarding, no midline pulsatile mass, no hepatomegaly, no splenomegaly EXT:  2 plus pulses upper, DP/PT left 2 plus, absent DP/PT right, no edema, no cyanosis no clubbing SKIN:  No rashes no nodules   EKG:  Sinus rhythm, rate 63, axis within normal limits, intervals within normal limits, no acute ST-T wave changes.  08/09/2014   ASSESSMENT AND PLAN  CORONARY ATHEROSCLEROSIS NATIVE CORONARY ARTERY -  The patient's chest pain has some atypical features greater than typical features. He did have a negative stress perfusion study last year. However, I cannot exclude angina at this point. I will elect to start Imdur 30 mg daily. Because of this and his shortness of breath and will be checking a BNP level which was normal last year. I will check an echocardiogram. If he continues to have further discomfort might need to consider invasive evaluation.  HYPERTENSION -  Blood pressure is well controlled. Continue the meds as listed.  UNSPECIFIED PERIPHERAL VASCULAR DISEASE -  He will continue with risk reduction.  PURE HYPERCHOLESTEROLEMIA -  His LDL was 62. He will continue meds as listed.  TOBACCO USER -  He is not smoking.

## 2014-08-09 NOTE — Patient Instructions (Addendum)
Your physician recommends that you schedule a follow-up appointment in:  One month with Cecilie Kicks NP  We have ordered an Echo   We have ordered bloodwork for you to get done  Take Imdur 30 mg daily

## 2014-08-12 ENCOUNTER — Ambulatory Visit: Payer: Commercial Managed Care - HMO | Admitting: Cardiology

## 2014-08-14 ENCOUNTER — Other Ambulatory Visit: Payer: Self-pay | Admitting: Nurse Practitioner

## 2014-08-15 ENCOUNTER — Other Ambulatory Visit: Payer: Self-pay | Admitting: Internal Medicine

## 2014-08-16 ENCOUNTER — Ambulatory Visit (HOSPITAL_COMMUNITY)
Admission: RE | Admit: 2014-08-16 | Discharge: 2014-08-16 | Disposition: A | Discharge status: Home / Self Care | Payer: Medicare HMO | Source: Ambulatory Visit | Attending: Cardiovascular Disease | Admitting: Cardiovascular Disease

## 2014-08-16 DIAGNOSIS — F17200 Nicotine dependence, unspecified, uncomplicated: Secondary | ICD-10-CM | POA: Diagnosis not present

## 2014-08-16 DIAGNOSIS — Z8249 Family history of ischemic heart disease and other diseases of the circulatory system: Secondary | ICD-10-CM | POA: Diagnosis not present

## 2014-08-16 DIAGNOSIS — I10 Essential (primary) hypertension: Secondary | ICD-10-CM | POA: Diagnosis not present

## 2014-08-16 DIAGNOSIS — I369 Nonrheumatic tricuspid valve disorder, unspecified: Secondary | ICD-10-CM

## 2014-08-16 DIAGNOSIS — E785 Hyperlipidemia, unspecified: Secondary | ICD-10-CM | POA: Insufficient documentation

## 2014-08-16 DIAGNOSIS — R0602 Shortness of breath: Secondary | ICD-10-CM | POA: Insufficient documentation

## 2014-08-16 DIAGNOSIS — R079 Chest pain, unspecified: Secondary | ICD-10-CM

## 2014-08-16 DIAGNOSIS — R0989 Other specified symptoms and signs involving the circulatory and respiratory systems: Secondary | ICD-10-CM

## 2014-08-16 DIAGNOSIS — R0600 Dyspnea, unspecified: Secondary | ICD-10-CM

## 2014-08-16 DIAGNOSIS — R0609 Other forms of dyspnea: Secondary | ICD-10-CM

## 2014-08-16 NOTE — Progress Notes (Signed)
2D Echocardiogram Complete.  08/16/2014   Darryon Bastin [place], [place]

## 2014-09-02 ENCOUNTER — Ambulatory Visit: Payer: Commercial Managed Care - HMO | Admitting: Cardiology

## 2014-09-08 ENCOUNTER — Ambulatory Visit (INDEPENDENT_AMBULATORY_CARE_PROVIDER_SITE_OTHER): Payer: Commercial Managed Care - HMO | Admitting: Cardiology

## 2014-09-08 ENCOUNTER — Encounter: Payer: Self-pay | Admitting: Cardiology

## 2014-09-08 VITALS — BP 138/63 | HR 72 | Ht 66.0 in | Wt 185.9 lb

## 2014-09-08 DIAGNOSIS — I25118 Atherosclerotic heart disease of native coronary artery with other forms of angina pectoris: Secondary | ICD-10-CM

## 2014-09-08 DIAGNOSIS — I10 Essential (primary) hypertension: Secondary | ICD-10-CM

## 2014-09-08 MED ORDER — ISOSORBIDE MONONITRATE ER 30 MG PO TB24: 45.0000 mg | ORAL_TABLET | Freq: Every day | ORAL | Status: DC

## 2014-09-08 NOTE — Progress Notes (Signed)
09/11/2014   PCP: Drema Pry, DO   Chief Complaint  Patient presents with  . Follow-up    pt denies chest pain. pt states that he does experience sob and swelling in his hands     Primary Cardiologist:  Dr. Percival Spanish  HPI: The patient presents for follow up of known coronary disease. 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 When he saw Dr. Percival Spanish 08/09/14 he had been having chest discomfort. He did have a stress perfusion study at the end of last year for chest discomfort which was negative for any evidence of ischemia and showed a well preserved ejection fraction. He stated he is having a somewhat different discomfort on recent visit. He reported that this is actually about one time per week. It happened at rest. It might last all day. It feels like somebody punched him. He's not describing radiation to his arm or jaw. He's not describing associated symptoms. He does have increasing shortness of breath walking 100 feet on level ground.  He was placed on Imdur 30 mg daily and Echo was done.  Today he is back for followup.  His echo with EF 28-31%, grade 1 diastolic dysfunction.  + atrial septal aneurysm with suggestion of PFO.   His bnp was low at 35.   He tells me he has less chest pain on imdur, still occurs but only once a week.  He does have SL NTG if needed.  Just walking across the yard may cause pain.    Additionaly he has SOB.   Allergies  Allergen Reactions  . Carboplatin Shortness Of Breath, Swelling and Rash    Swelling of lips, rash on face,eyes and head    Current Outpatient Prescriptions  Medication Sig Dispense Refill  . albuterol (PROVENTIL HFA) 108 (90 BASE) MCG/ACT inhaler 2 puffs every 4 hours if needed  2 Inhaler  prn  . albuterol (PROVENTIL) (2.5 MG/3ML) 0.083% nebulizer solution Take 3 mLs (2.5 mg total) by nebulization every 4 (four) hours as needed for wheezing or  shortness of breath.  360 mL  1  . ALPRAZolam (XANAX) 0.25 MG tablet Take 0.25 mg by mouth daily as needed.      . ARIPiprazole (ABILIFY) 5 MG tablet Take 1 tablet (5 mg total) by mouth at bedtime.      . Armodafinil (NUVIGIL) 150 MG tablet Take 150 mg by mouth daily.      [name] atorvastatin (LIPITOR) 20 MG tablet TAKE 1 TABLET (20 MG TOTAL) BY MOUTH DAILY.  90 tablet  3  . clonazePAM (KLONOPIN) 1 MG tablet Take 1 mg by mouth at bedtime as needed. For anxiety/insomnia      . esomeprazole (NEXIUM) 40 MG capsule Take 40 mg by mouth daily.      [name] FLUoxetine (PROZAC) 40 MG capsule Take 40 mg by mouth daily.        . fluticasone (FLONASE) 50 MCG/ACT nasal spray Place 2 sprays into both nostrils daily.  16 g  3  . Fluticasone-Salmeterol (ADVAIR DISKUS) 500-50 MCG/DOSE AEPB 1 puff and rinse mouth, twice daily  60 each  prn  . gabapentin (NEURONTIN) 100 MG capsule Take 100 mg by mouth daily.       . Insulin Glargine (LANTUS SOLOSTAR) 100 UNIT/ML Solostar Pen 5 units at bedtime.  5 pen  PRN  . Insulin Pen Needle (BD PEN NEEDLE  NANO U/F) 32G X 4 MM MISC 1 each by Does not apply route daily.  100 each  3  . isosorbide mononitrate (IMDUR) 30 MG 24 hr tablet Take 1.5 tablets (45 mg total) by mouth daily.  45 tablet  6  . methocarbamol (ROBAXIN) 500 MG tablet Take 500 mg by mouth 2 (two) times daily as needed for muscle spasms.       . metoprolol succinate (TOPROL-XL) 25 MG 24 hr tablet TAKE 1/2 TABLET BY MOUTH DAILY  30 tablet  5  . mirtazapine (REMERON) 15 MG tablet Take 22.5 mg by mouth at bedtime.      [name] morphine (MS CONTIN) 60 MG 12 hr tablet Take 60 mg by mouth 2 (two) times daily.        . nitroGLYCERIN (NITROSTAT) 0.4 MG SL tablet Place 1 tablet (0.4 mg total) under the tongue every 5 (five) minutes as needed for chest pain.  25 tablet  3  . Omega-3 Fatty Acids (FISH OIL CONCENTRATE) 1000 MG CAPS Take 1,000 mg by mouth daily.       . potassium chloride SA (K-DUR,KLOR-CON) 20 MEQ tablet Take 20 mEq by mouth  daily.      . prochlorperazine (COMPAZINE) 10 MG tablet TAKE 1 TABLET BY MOUTH EVERY 6 HOURS AS NEEDED FOR NAUSEA AND VOMITING  60 tablet  3  . VOLTAREN 1 % GEL Apply 2 g topically daily as needed (for pain).        No current facility-administered medications for this visit.    Past Medical History  Diagnosis Date  . CAD (coronary artery disease)     Left Main 30% stenosis, LAD 20 - 30 % stenosis, first and second diagonal branchesat 40 - 50%  stenosis with small arteries, circumflex had 30% stenosis in the large obtuse marginal, RCA at 70 - 80%  stenosis [not felt to be occlusive after evaluation with flow wire], distal 50 - 60% stenosis - [name][  . COPD (chronic obstructive pulmonary disease)     Dr. Baird Lyons  . Depression   . Anxiety   . Hyperlipidemia   . Chronic insomnia   . Gout   . GERD (gastroesophageal reflux disease)   . PVD (peripheral vascular disease)     (ABI 0.9 on right and 0.89 on left)  severe left external iliac stenosis.  He  had successful stenting of his left external iliac per Dr. Burt Knack.  . DDD (degenerative disc disease)   . Hx of colonoscopy   . 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 OSA (obstructive sleep apnea)     NPSG 09/10/10- AHI 11.3/hr  . Hypertension     dr Percival Spanish  . History of radiation therapy 11/10/13- 12/29/13    left lung 6600 cGy in 33 sessions  . Cancer     lung  . Lung cancer 10/04/13    LUL squamous cell lung cancer    Past Surgical History  Procedure Laterality Date  . Spinal fusion  03/05/2007    L4-L5  . Hip surgery      left  . Arm surgery      left  . Shoulder surgery      right  . C-spine surgery    . Angioplasty    . Video bronchoscopy with endobronchial navigation N/A 10/04/2013    Procedure: VIDEO BRONCHOSCOPY WITH ENDOBRONCHIAL NAVIGATION;  Surgeon: Collene Gobble, MD;  Location: [facility];  Service: Thoracic;  Laterality: N/A;  . Back surgery  WYS:HUOHFGB:MS colds or fevers, no weight changes Skin:no  rashes or ulcers HEENT:no blurred vision, no congestion CV:see HPI PUL:see HPI GI:no diarrhea constipation or melena, no indigestion GU:no hematuria, no dysuria MS:no joint pain, no claudication Neuro:no syncope, no lightheadedness Endo:no diabetes, no thyroid disease  Wt Readings from Last 3 Encounters:  09/08/14 185 lb 14.4 oz (84.324 kg)  08/09/14 181 lb 9.6 oz (82.373 kg)  07/21/14 182 lb 6.4 oz (82.736 kg)    PHYSICAL EXAM BP 138/63  Pulse 72  Ht 5\' 6"  (1.676 m)  Wt 185 lb 14.4 oz (84.324 kg)  BMI 30.02 kg/m2 General:Pleasant affect, NAD Skin:Warm and dry, brisk capillary refill HEENT:normocephalic, sclera clear, mucus membranes moist Neck:supple, no JVD, no bruits  Heart:S1S2 RRR without murmur, gallup, rub or click Lungs:clear without rales, rhonchi, or wheezes XJD:BZMC, non tender, + BS, do not palpate liver spleen or masses Ext:no lower ext edema, 2+ pedal pulses, 2+ radial pulses Neuro:alert and oriented, MAE, follows commands, + facial symmetry EYE:MVVK  ASSESSMENT AND PLAN CORONARY ATHEROSCLEROSIS NATIVE CORONARY ARTERY Improved chest pain with exertion, though still occurs only less frequently.  I am increasing Imdur to 45 mg daily.  He will follow up with Dr. Vita Barley in 2 months, though instructed if pain continues to call us.  Will send note to Dr. Vita Barley  - for further recommendations.  Essential hypertension controlled

## 2014-09-08 NOTE — Patient Instructions (Signed)
Your physician recommends that you schedule a follow-up appointment in: 2 Months with Dr Ellyn Hack  Your physician has recommended you make the following change in your medication: Increase Isosorbide to 1 1/2 tablets

## 2014-09-11 NOTE — Assessment & Plan Note (Signed)
Improved chest pain with exertion, though still occurs only less frequently.  I am increasing Imdur to 45 mg daily.  He will follow up with Dr. Vita Barley in 2 months, though instructed if pain continues to call us.  Will send note to Dr. Vita Barley  - for further recommendations.

## 2014-09-11 NOTE — Assessment & Plan Note (Signed)
controlled 

## 2014-09-15 ENCOUNTER — Other Ambulatory Visit: Payer: Self-pay | Admitting: Internal Medicine

## 2014-09-15 ENCOUNTER — Telehealth: Payer: Self-pay | Admitting: Internal Medicine

## 2014-09-15 NOTE — Telephone Encounter (Signed)
Noted  

## 2014-09-15 NOTE — Telephone Encounter (Signed)
Patient Information:  Caller Name: Rodena Piety  Phone: [phone]  Patient: Ernest [name]  Gender: Male  DOB: Dec 22, 1955  Age: 58 Years  PCP: Shawna Orleans [facility]) (Adults only)  Office Follow Up:  Does the office need to follow up with this patient?: No  Instructions For The Office: N/A  RN Note:  Pt. is a diabetic. S/P Lung Cancer. Last Tetanus was 11/25/2001. Scheduled for 09:30 on 09/16/14. Wife states cannot be there before 09:30. Dr. Shawna Orleans not in the office.  Symptoms  Reason For Call & Symptoms: At 14:00,(09/15/14)he stepped on a nail in the yard that went thru the sole of the shoe. Has not had a Tetanus in a while. Wife cleansed with peroxide and put Triple Antibiotic Ointment on it.  Reviewed Health History In EMR: Yes  Reviewed Medications In EMR: Yes  Reviewed Allergies In EMR: Yes  Reviewed Surgeries / Procedures: Yes  Date of Onset of Symptoms: 09/15/2014  Treatments Tried: Cleaned with Peroxide and placed Triple Antibiotic Ointment on the puncture wound.  Treatments Tried Worked: No  Guideline(s) Used:  Foot Pain  Leg Injury  Disposition Per Guideline:   See Today or Tomorrow in Office  Reason For Disposition Reached:   Wound and no tetanus booster in > 5 years (Or greater than 10 years for clean cuts)  Advice Given:  Apply a Cold Pack:  Apply a cold pack or an ice bag (wrapped in a moist towel) to the area for 20 minutes. Repeat in 1 hour, then every 4 hours while awake.  Continue this for the first 48 hours after an injury.  This will help decrease pain and swelling.  Elevate the Leg:  Lay down and put your leg on a pillow. This puts (elevates) the leg above the heart.  Do this for 15-20 minutes, 2-3 times a day, for the first two days.  This can also help decrease swelling, bruising, and pain.  Expected Course:  Pain, swelling, and bruising usually start to get better 2 to 3 days after an injury.  Swelling most often is gone after 1 week.  Bruises  fade away slowly over 1-2 weeks.  It may take 2 weeks for pain and tenderness of the injured area to go away.  Call Back If:  Pain becomes severe  You become worse.  Patient Will Follow Care Advice:  YES  Appointment Scheduled:  09/16/2014 09:30:00 Appointment Scheduled Provider:  Carolann Littler [facility])

## 2014-09-16 ENCOUNTER — Encounter: Payer: Self-pay | Admitting: Family Medicine

## 2014-09-16 ENCOUNTER — Ambulatory Visit (INDEPENDENT_AMBULATORY_CARE_PROVIDER_SITE_OTHER): Payer: Commercial Managed Care - HMO | Admitting: Family Medicine

## 2014-09-16 VITALS — BP 140/74 | HR 66 | Temp 98.4°F | Wt 186.0 lb

## 2014-09-16 DIAGNOSIS — S91331A Puncture wound without foreign body, right foot, initial encounter: Secondary | ICD-10-CM

## 2014-09-16 DIAGNOSIS — Z23 Encounter for immunization: Secondary | ICD-10-CM

## 2014-09-16 MED ORDER — CIPROFLOXACIN HCL 750 MG PO TABS: 750.0000 mg | ORAL_TABLET | Freq: Two times a day (BID) | ORAL | Status: DC

## 2014-09-16 NOTE — Progress Notes (Signed)
   Subjective:    Patient ID: Ernest [name], male    DOB: October 10, 1956, 58 y.o.   MRN: [id number]  HPI 58 year old male presents today for puncture wound to the right plantar aspect of MTP joint yesterday afternoon.  He was working in the yard and stepped on a small rusty nail.  It punctured through his shoe.  Was able to easily remove the nail from the boot and no remnants were left in the skin.  Patient states that it bleed for a little while.  Denies fever, redness, drainage of pus.  Denies chills, SOB, numbness/ tingling, loss of sensation.  Washed the wound really well last night and used hydrogen peroxide.  Nothing for pain was needed.   Review of Systems  Constitutional: Negative for fever, chills, diaphoresis, appetite change and fatigue.  Respiratory: Negative for chest tightness and shortness of breath.   Cardiovascular: Negative for chest pain.  Gastrointestinal: Negative for nausea, vomiting and abdominal distention.  Musculoskeletal: Negative for gait problem, joint swelling and myalgias.  Skin: Positive for wound. Negative for color change.  Neurological: Negative for dizziness, weakness, light-headedness, numbness and headaches.  Hematological: Does not bruise/bleed easily.       Objective:   Physical Exam  Nursing note and vitals reviewed. Constitutional: He is oriented to person, place, and time. He appears well-developed and well-nourished. No distress.  Cardiovascular: Normal rate, regular rhythm and normal heart sounds.   No murmur heard. Pulmonary/Chest: Effort normal. No respiratory distress.  Musculoskeletal: Normal range of motion. He exhibits tenderness. He exhibits no edema.  Point tenderness over planar aspect of big toe where the wound is.  No erythema, pus draining  Neurological: He is alert and oriented to person, place, and time.  Skin: Skin is warm and dry. He is not diaphoretic. No erythema.  1 mm puncture wound to the plantar aspect of right big  toe.  Wound does not appear open.  No remnants of rusty nail still in the skin.  No erythema          Assessment & Plan:  Puncture wound-  Patient was given tetanus shot.  His last vaccine was in 2003.  Patient was given cipro 750 mg BID to over for pseudomonis.  He was told to only start medication if signs of infection appeared such as fever, heat, swelling, erythema.    Ernest Art PA-S  As above.  Absolutely no signs of secondary infection at this time. He was given parameters for things to watch out for and to start Cipro promptly if he sees any signs of infection but encouraging that he is over 15 hours from puncture with no signs of infection  Ernest Littler MD

## 2014-09-16 NOTE — Patient Instructions (Signed)
Start the antibiotic for any increased redness, swelling, or warmth.

## 2014-09-16 NOTE — Progress Notes (Signed)
Pre visit review using our clinic review tool, if applicable. No additional management support is needed unless otherwise documented below in the visit note. 

## 2014-10-14 ENCOUNTER — Ambulatory Visit: Payer: Commercial Managed Care - HMO | Admitting: Internal Medicine

## 2014-10-18 ENCOUNTER — Ambulatory Visit: Payer: Commercial Managed Care - HMO | Admitting: Internal Medicine

## 2014-10-19 ENCOUNTER — Encounter: Payer: Self-pay | Admitting: Internal Medicine

## 2014-10-19 ENCOUNTER — Ambulatory Visit (INDEPENDENT_AMBULATORY_CARE_PROVIDER_SITE_OTHER): Payer: Commercial Managed Care - HMO | Admitting: Internal Medicine

## 2014-10-19 VITALS — BP 134/70 | HR 79 | Temp 98.1°F | Ht 65.5 in | Wt 191.0 lb

## 2014-10-19 DIAGNOSIS — G4733 Obstructive sleep apnea (adult) (pediatric): Secondary | ICD-10-CM

## 2014-10-19 DIAGNOSIS — C3492 Malignant neoplasm of unspecified part of left bronchus or lung: Secondary | ICD-10-CM

## 2014-10-19 DIAGNOSIS — J441 Chronic obstructive pulmonary disease with (acute) exacerbation: Secondary | ICD-10-CM

## 2014-10-19 DIAGNOSIS — J449 Chronic obstructive pulmonary disease, unspecified: Secondary | ICD-10-CM

## 2014-10-19 MED ORDER — CODEINE POLST-CHLORPHEN POLST 14.7-2.8 MG/5ML PO LQCR: 10.0000 mL | Freq: Two times a day (BID) | ORAL | Status: DC | PRN

## 2014-10-19 MED ORDER — ALBUTEROL SULFATE HFA 108 (90 BASE) MCG/ACT IN AERS: INHALATION_SPRAY | RESPIRATORY_TRACT | Status: DC

## 2014-10-19 NOTE — Assessment & Plan Note (Addendum)
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 Plan- Change O2 order to continuous, w portable concentrator              Try Tuzistra XR cough syrup- coupon available

## 2014-10-19 NOTE — Assessment & Plan Note (Signed)
Good compliance and control on CPAP 10 with O2 2L/ Huey Romans

## 2014-10-19 NOTE — Patient Instructions (Addendum)
Walk exercise on room air for O2 documentation-done  Order- [employer]- Change O2 order to continuous and portable 2-3L/ min                         Evaluate for portable O2 concentrator                        Dx  COPD mixed type with exacerbation, chronic hypoxic                                      respiratory failure, lung cancer  Script sent for rescue inhalers  Script printed with card for Tuzistra XR cough syrup

## 2014-10-19 NOTE — Assessment & Plan Note (Signed)
Large LUL consolidation  On last CT, c/w dense radiation fibrosis, but residual Ca not excluded. F/U by Oncology with CT pending

## 2014-10-19 NOTE — Progress Notes (Signed)
Patient ID: Ernest [name], male    DOB: 14-Dec-1955, 58 y.o.   MRN: [id number]  HPI 07/30/11- 65 yoM smoker followed for COPD/ bronchitis, OSA/ CPAP Last here 04/01/11- note reviewed. He had felt better on a trial of maintenance prednisone. He has felt more short of breath through the hot summer, with no acute event and without blood, chest pain or fever. Easy DOE around home. Has been sharing nebulizer with his wife, who now has a cold and we discussed this. Has been worse in last month, and expecially in recent rainy weather. Noting a little ankle swelling. Smothered trying to take a shower.  Only tolerating his CPAP about 3 nights/ week because he feels smothered, waking sore in anterior chest in the mornings.  Now down to only 2-3 cigarettes/ day and trying to stop.  CXR 08/14/10- Nl NAD PFT 05/26/08- FEV1/FVC 2.44/ 0.76, incr 19% w/ BD, R 0.59 ?DLCO 0.69  09/10/11- 55 yoM smoker followed for COPD/ bronchitis, OSA/ CPAP He reports feeling better "finally doing okay". He is down to just a couple of cigarettes a day. He is using CPAP regularly now and is comfortable with it. Alpha I antitrypsin Report came back normal "M. M." on 08/09/2011. Chest x-ray from 08/15/2011 showed stable changes of COPD. He has had flu vaccine.  11/12/11- 66 yoM smoker followed for COPD/ bronchitis, OSA/ CPAP Here with wife. Has had flu shot. He says he was doing well until 2 weeks ago. He again is coughing thick mucus which is difficult to clear. He depends on his metered inhaler and nebulizer to help clear his airways. Started Mucinex 3 days ago. Mucus is white. He denies fever, sore throat, chest pain, GI upset. Because of orthopnea he is sitting up to sleep for the past 2 nights.  01/14/12- 55 yoM smoker followed for COPD/ bronchitis, OSA/ CPAP Increase congested cough green to yellow sputum but he does not feel sick and denies fever. Sister who was a smoker recently died of lung cancer. Despite that and all of our  discussions, he still smokes a few cigarettes daily. We talked again about motivation to stop completely. Continues CPAP and Provigil one or 2 daily.  03/10/12  56 yoM smoker followed for COPD/ bronchitis, OSA/ CPAP Persistent bronchitic cough. Sputum is not purulent. He is "working on" his smoking habit but blames pollen for keeping him S. coughing now. Remains fully compliant with CPAP, all night every night. Still needs Provigil to help with residual daytime sleepiness as before.  05/12/12- 56 yoM smoker followed for COPD/ bronchitis, OSA/ CPAP Stays SOB all the time-even at rest; wheezing, coughing-productive-yellow in color, chest congestion, nasal congestion,. He is more interested today in smoking cessation which we discussed. He had quit for Daliresp after 10 days because of sustained nausea. He is taking all of his regular medicines and using his rescue inhaler several times a day. Off prednisone now for 1-2 months. CXR 01/23/12-  IMPRESSION:  No acute cardiopulmonary abnormality.  Original Report Authenticated By: Randall An, M.D.   07/13/12- 51 yoM smoker followed for COPD/ bronchitis, OSA/ CPAP, complicated by CAD Pt states increased sob,wheezing,productive cough x3 months. Still smoking with no serious effort to stop. His sister has lung cancer which has gotten his attention and he may be willing to try harder. Some days his breathing is better than others with no real trend. Coughing productive of white sputum with no blood. He has all of his medications currently.  07/30/12- 89 yoM  smoker followed for COPD/ bronchitis, OSA/ CPAP, complicated by CAD Cough-productive-yellow and thick; SOB, wheezing, chest congestion, head congestion x 1 week; O2 sat of 83% RA entered room-20 seconds RA sat of 90% CT 07/21/12-  IMPRESSION:  1. Tiny bilateral lung nodules which are similar to on the prior  exam, given differences in slice selection and measurement error.  Likely subpleural lymph  nodes.  2. Age advanced coronary artery atherosclerosis. Recommend  assessment of coronary risk factors and consideration of medical  therapy.  3. Slight enlargement of splenic lesion since 2007. Favored to  represent a hemangioma or lymphangioma.  Original Report Authenticated By: Areta Haber, M.D.   08/07/12- 55 yoM smoker followed for COPD/ bronchitis, OSA/ CPAP, complicated by CAD, disability for back pain. Cough-productive-white mostly; SOB , wheezing-worse at night     wife here Coughing spasms he can feel his upper airway is closing off. He he still smokes a little even when he feels badly and we went over this again today offering support.  11/13/12- 56 yoM smoker followed for COPD/ bronchitis, OSA/ CPAP, complicated by CAD, disability for back pain. FOLLOWS RCV:ELFYB still but not productive-more SOB  Went to ER in September or acute exacerbation of COPD but not admitted. Today he is a scheduled visit. He actually says he is doing pretty well with no acute problems. Perennial nasal congestion. He has reduced his cigarette smoking to about 2 per day and is strongly encouraged to stop completely now.  01/25/13-  57 yoM smoker followed for COPD/ bronchitis, OSA/ CPAP, complicated by CAD, disability for back pain. ACUTE VISIT: started smoking agian-discuss taking Chantix; feels raw in chest area with deep cough x 2 weeks. He feels well at this visit, meaning he is at his baseline some daily nonproductive cough and shortness of breath with exertion.  05/17/13-57 yoM smoker followed for COPD/ bronchitis, OSA/ CPAP, complicated by CAD, disability for back pain. FOLLOWS OFB:PZWCHENID is about the same as last visit; has lessened the about the amount he is smoking. Chantix helps. Continues CPAP/ 10/[facility] Discussed smoking cessation effort.  06/10/13- 57 yoM smoker followed for COPD/ bronchitis, OSA/ CPAP, complicated by CAD, disability for back pain. ACUTE VISIT: prod cough with yellow/green  mucus, increased SOB, wheezing, tightness in chest, chills x1 week.  still on augmentin and pred taper from 7/15 phone note > no better Trying Chantix. Wife is also sick. There exposed to his brother's child who has frequent colds. Just started prednisone taper and Augmentin yesterday. Had teeth pulled.  09/16/13- 57 yoM smoker followed for COPD/ bronchitis, OSA/ CPAP, complicated by CAD, disability for back pain. FOLLOWS FOR: was given the flu shot about 1 month and has not felt good since; continues to keep a headache, chest congestion at night. Describes frontal headache, head and chest congestion since a flu shot. Using Chantix, has reduced smoking to 2 or 3 cigarettes daily. Denies infection-no fever, sputum not purulent or bloody. No chest pain. Stays on Mucinex. Not using CPAP because of head congestion. Sister has died of lung cancer/smoker.  10/21/2013- 57 yoM smoker followed for COPD/ bronchitis, OSA/ CPAP, SqCell CA LUL,complicated by CAD, disability for back pain. FOLLOWS FOR: review bx results with patient; continues to have bad frontal lobe headache and lots of congestion. Nausea last night. No vomiting. Abnl CXR > CT chest/ large LUL mass to hilum. We had notified pt and scheduled bx. He is not coughing up much, denies chest pain. New headache mostly left frontal and  retro-orbital x 1 month with no lateralizing neuro. Remains tight on routine meds, off prednisone.  His wife blames "anxiety" for ER trip/ nebulizer the night before his bronchoscopy. Sister recently died of small cell Ca lung.  CT chest 09/17/13- IMPRESSION:  1. 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 This is associated with  some postobstructive changes in the left upper lobe as well as left  hilar and AP window lymphadenopathy. Based on these findings, and  assuming lack of distant metastatic disease (which will have to be  proven by other imaging  studies), this is favored to represent at  least T2b (if not T3 because of satellite nodules in the left upper  lobe), N2, Mx disease (i.e., at least stage IIIA disease). Further  evaluation with PET-CT and brain MRI with and without IV gadolinium  is suggested at this time for complete staging.  2. Mild centrilobular and paraseptal emphysema.  3. Atherosclerosis, including 3 vessel coronary artery disease.  Assessment for potential risk factor modification, dietary therapy  or pharmacologic therapy may be warranted, if clinically indicated.  4. Additional incidental findings, as above.  Electronically Signed  By: Vinnie Langton M.D.  On: 09/22/2013 11:34  Bronch / needle bx 10/04/13- by Dr Lamonte Sakai Pos Squamous Cell Ca  1/23//15- 57 yoM former smoker followed for COPD/ bronchitis, OSA/ CPAP, SqCell CA LUL/ XRT/Chemo, complicated by CAD, disability for back pain. Myocardial Perfusion 11/19/13- EF 65%, Nl wall motion, no ischemic changes. Follows for: frontal lobe headaches have gone away, SOB constantly, some prod cough with white/clear mucous.  Not smoking.  Nearing completion of current rounds of Chemo/ XRT.  Minor sore throat, breathing ok- some SOB, dry cough.  No recent steroids or ABX except Advair.  Continues CPAP 10/ [facility] with no problem.   01/06/13- 57 yoM former smoker followed for COPD/ bronchitis, OSA/ CPAP, SqCell CA LUL/ XRT/Chemo, complicated by CAD, disability for back pain. ACUTE VISIT:  Increase sob, wheezing and cough with congestion head and chest. Also, has bodyaches and sweats Acute illness x1 week started as head congestion moving to his chest with fever, white sputum turning yellow, myalgias. No blood. Had had flu shot. Has completed radiation and chemotherapy pending next oncology followup. Admits smoking 2 cigarettes since last here.  03/02/14- 67 yoM some-time smoker followed for COPD/ bronchitis, OSA/ CPAP, SqCell CA LUL/ XRT/Chemo, complicated by CAD, disability  for back pain. FOLLOWS FOR: Post hospital; Pt states that continues to have congestion, wheezing, and having left sided chest pain this morning. Had reaction to chemo yesterday-started having trouble breathing and hot flashes-was given breathing tx and injection. Hospitalized in with pneumonia, COPD with asthma 2 weeks ago. CT chest 01/28/2014 showed his left upper lobe mass to be cavitating. Yesterday he had reaction to chemotherapy requiring nebulizer treatment and injection. More dyspnea on exertion at baseline, gradually worse over the past month. Coughing clear phlegm. Nebulizer helps temporarily. This morning head left upper anterior lateral chest pain which is gradually fading. He admits he is smoking some-discussed. Continues CPAP 10/[facility] CXR 02/15/14 IMPRESSION:  No acute findings and no change from the study performed earlier  this same day.  Electronically Signed  By: Lajean Manes M.D.  On: 02/15/2014 09:23  04/19/14- 7 yoM some-time smoker followed for COPD/ bronchitis, OSA/ CPAP, SqCell CA LUL/ XRT/Chemo, complicated by CAD, disability for back pain, DM. FOLLOWS FOR: Breathing is terrible. Recently had PNA-Dr Yoo-still on abx  for this. CPAP 10/ O2 2L/ [facility] for sleep Walk test today 3x 180 m on room air- lowest sat was 92%. Scheduled for 3 more days of Levaquin for pneumonia. Improving significantly now. Cough is productive white. CT chest 04/12/14 IMPRESSION:  1. Favor response to therapy of a residual cavitary left upper lobe  lung lesion. Decreased size of the cavitary component with increased  surrounding soft tissue thickening, favored to be treatment related.  Similarly, evolving radiation change within the surrounding left  upper lobe and left apex. The apical presumed radiation change  obscures the previously described left apical pulmonary nodule.  Recommend attention on follow-up.  2. Similar borderline prevascular adenopathy.  3. Resolved left-sided pleural effusion  with trace left pleural  fluid remaining.  4. Minimal left lower lobe nodularity which could be infectious or  inflammatory. Recommend attention on follow-up.  5. Pulmonary artery enlargement suggests pulmonary arterial  hypertension.  6. Incompletely imaged similar cystic/septated splenic lesion. Favor  a benign lesion such as a lymphangioma.  Electronically Signed  By: Abigail Miyamoto M.D.  On: 04/12/2014 13:57  07/19/14- 69 yoM some-time smoker followed for COPD/ bronchitis, OSA/ CPAP, SqCell CA LUL/ XRT/Chemo, complicated by CAD, disability for back pain, DM.    Wife here FOLLOWS FOR: has had choking cough since last here-productive and clear in color. SOB and wheezing. He says cough is not new and not changed. No blood. Occasional localized left parasternal pain lasts for a day or 2 at a time. He is using rescue inhaler too frequently and we discussed use of maintenance controller and nebulizer machine more appropriately. Takes occasional Nuvigil but still needs to nap. Continues Nexium without recognizing much heartburn.  10/19/14- 58 yoM former smoker followed for COPD/ bronchitis, OSA/ CPAP, SqCell CA LUL/ XRT/Chemo, complicated by CAD, disability for back pain, DM.     FOLLOWS FOR: Increased SOB and wheezing, Chest congestion-unable to cough anything up, chills as well x 2 weeks, no fever, blood, or purulent. Sleeps propped up recliner. Neb 4-5x/ day, frequent rescue inhaler. Sleep O2 2l/ [facility] with CPAP 10 are ok used every night. Needs O2 day/ portable Walk test today - 85% on RA at rest, corrected w O2.. CT 07/19/14 IMPRESSION: Decreased size of cavitary component of left upper lobe mass lesion corresponding to the reported history of the lung cancer. The degree of adjacent volume loss and masslike consolidation of the left upper lobe is increased since previously which may reflect treatment effect secondary to radiation fibrosis. This is amenable to followup and continued  surveillance. No new evidence for intrathoracic metastatic disease. Electronically Signed  By: Conchita Paris M.D.  On: 07/19/2014 14:06  Review of Systems- see HPI Constitutional:   No-   weight loss, night sweats, +fevers, chills,+ fatigue, lassitude. HEENT:  No- headaches, difficulty swallowing, tooth/dental problems, sore throat,       No-  sneezing, itching, ear ache, nasal congestion, post nasal drip,  CV:  + atypical chest pain, orthopnea, PND,  Little swelling in lower extremities,                              No-dizziness, palpitations Resp: +  shortness of breath with exertion or at rest.             No-productive cough,  + non-productive cough,  No-  coughing up of blood.              No-change in  color of mucus.  +some wheezing.   Skin: No-   rash or lesions. GI:  No-   heartburn, indigestion, abdominal pain, nausea, vomiting, GU:  MS:  + pain right knee after a fall   Neuro- nothing unusual  Psych:  No- change in mood or affect. No depression or anxiety.  No memory loss.    Objective:   Physical Exam General- Alert, Oriented, Affect-appropriate, Distress+chronically ill appearing                           stocky/  overweight,     Resting room air sat today 85%qualifies for                                   continuous O2. Walk test done. Skin- rash-none, lesions- none, excoriation- none.      Lymphadenopathy- none Head- atraumatic            Eyes- Gross vision intact, +PERRLA, conjunctivae- injected. + peri-orbital                       edema            Ears- Hearing, canals- normal            Nose- +turbinate edema, No- Septal dev, +mucus bridging, no-polyps,                                      erosion, No-perforation             Throat- Mallampati III , mucosa- normal, drainage- none, tonsils- atrophic, Neck- flexible , trachea midline, no stridor , thyroid nl, carotid no bruit Chest - symmetrical excursion , unlabored           Heart/CV- RRR , no murmur , no gallop   , no rub, nl s1 s2                           - JVD- none , edema-none, stasis changes- none, varices- none           Lung- cough-none, shallow,  Wheeze+ Mild, dullness-none, rub-none                                            Chest wall-  Abd-  Br/ Gen/ Rectal- Not done, not indicated Extrem- cyanosis- none, clubbing, none, atrophy- none, strength- nl. 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 Neuro- grossly intact to observation

## 2014-10-21 ENCOUNTER — Ambulatory Visit (HOSPITAL_COMMUNITY)
Admission: RE | Admit: 2014-10-21 | Discharge: 2014-10-21 | Disposition: A | Discharge status: Home / Self Care | Payer: Medicare HMO | Source: Ambulatory Visit | Attending: Internal Medicine | Admitting: Internal Medicine

## 2014-10-21 ENCOUNTER — Encounter (HOSPITAL_COMMUNITY): Payer: Self-pay

## 2014-10-21 ENCOUNTER — Other Ambulatory Visit (HOSPITAL_BASED_OUTPATIENT_CLINIC_OR_DEPARTMENT_OTHER): Payer: Commercial Managed Care - HMO

## 2014-10-21 ENCOUNTER — Telehealth: Payer: Self-pay | Admitting: Pulmonary Disease

## 2014-10-21 DIAGNOSIS — Z9221 Personal history of antineoplastic chemotherapy: Secondary | ICD-10-CM | POA: Diagnosis not present

## 2014-10-21 DIAGNOSIS — Z923 Personal history of irradiation: Secondary | ICD-10-CM | POA: Diagnosis not present

## 2014-10-21 DIAGNOSIS — C3492 Malignant neoplasm of unspecified part of left bronchus or lung: Secondary | ICD-10-CM | POA: Diagnosis not present

## 2014-10-21 DIAGNOSIS — C3412 Malignant neoplasm of upper lobe, left bronchus or lung: Secondary | ICD-10-CM

## 2014-10-21 LAB — COMPREHENSIVE METABOLIC PANEL (CC13)
ALT: 28 U/L (ref 0–55)
ANION GAP: 12 meq/L — AB (ref 3–11)
AST: 20 U/L (ref 5–34)
Albumin: 3.2 g/dL — ABNORMAL LOW (ref 3.5–5.0)
Alkaline Phosphatase: 137 U/L (ref 40–150)
BUN: 11.3 mg/dL (ref 7.0–26.0)
CHLORIDE: 98 meq/L (ref 98–109)
CO2: 30 meq/L — AB (ref 22–29)
Calcium: 9.7 mg/dL (ref 8.4–10.4)
Creatinine: 0.8 mg/dL (ref 0.7–1.3)
GLUCOSE: 156 mg/dL — AB (ref 70–140)
Potassium: 4.1 mEq/L (ref 3.5–5.1)
SODIUM: 141 meq/L (ref 136–145)
Total Bilirubin: 0.35 mg/dL (ref 0.20–1.20)
Total Protein: 6.6 g/dL (ref 6.4–8.3)

## 2014-10-21 LAB — CBC WITH DIFFERENTIAL/PLATELET
BASO%: 1 % (ref 0.0–2.0)
Basophils Absolute: 0.1 10*3/uL (ref 0.0–0.1)
EOS ABS: 0.3 10*3/uL (ref 0.0–0.5)
EOS%: 2.9 % (ref 0.0–7.0)
HCT: 44.3 % (ref 38.4–49.9)
HGB: 14 g/dL (ref 13.0–17.1)
LYMPH#: 1.1 10*3/uL (ref 0.9–3.3)
LYMPH%: 10.1 % — ABNORMAL LOW (ref 14.0–49.0)
MCH: 28.2 pg (ref 27.2–33.4)
MCHC: 31.6 g/dL — AB (ref 32.0–36.0)
MCV: 89 fL (ref 79.3–98.0)
MONO#: 1 10*3/uL — AB (ref 0.1–0.9)
MONO%: 9.8 % (ref 0.0–14.0)
NEUT%: 76.2 % — ABNORMAL HIGH (ref 39.0–75.0)
NEUTROS ABS: 8.1 10*3/uL — AB (ref 1.5–6.5)
PLATELETS: 144 10*3/uL (ref 140–400)
RBC: 4.98 10*6/uL (ref 4.20–5.82)
RDW: 16.3 % — ABNORMAL HIGH (ref 11.0–14.6)
WBC: 10.6 10*3/uL — AB (ref 4.0–10.3)

## 2014-10-21 MED ORDER — IOHEXOL 300 MG/ML  SOLN
80.0000 mL | Freq: Once | INTRAMUSCULAR | Status: AC | PRN
  Administered 2014-10-21: 80 mL via INTRAVENOUS

## 2014-10-21 NOTE — Telephone Encounter (Signed)
Discussed situation with patient's wife, they cannot afford the cough syrup sent in yesterday.  Chart reviewed, has lung cancer, COPD.  My partner prescribed codeine based cough syrup yesterday. Discussed with pharmacist at [facility] on [address] who recommended Robitussin AC as a less expensive option.  Sent Rx for one pint, 0 refill.

## 2014-10-26 ENCOUNTER — Encounter: Payer: Self-pay | Admitting: Internal Medicine

## 2014-10-26 ENCOUNTER — Ambulatory Visit (INDEPENDENT_AMBULATORY_CARE_PROVIDER_SITE_OTHER): Payer: Commercial Managed Care - HMO | Admitting: Internal Medicine

## 2014-10-26 VITALS — BP 148/84 | HR 73 | Temp 98.0°F | Ht 65.5 in | Wt 191.0 lb

## 2014-10-26 DIAGNOSIS — R739 Hyperglycemia, unspecified: Secondary | ICD-10-CM

## 2014-10-26 DIAGNOSIS — Z86010 Personal history of colonic polyps: Secondary | ICD-10-CM

## 2014-10-26 DIAGNOSIS — J441 Chronic obstructive pulmonary disease with (acute) exacerbation: Secondary | ICD-10-CM

## 2014-10-26 MED ORDER — DOXYCYCLINE HYCLATE 100 MG PO TABS: 100.0000 mg | ORAL_TABLET | Freq: Two times a day (BID) | ORAL | Status: DC

## 2014-10-26 NOTE — Assessment & Plan Note (Addendum)
Patient does not recall how long it has been since his last colonoscopy.  Old records not available.  Refer to Dr. Hilarie Fredrickson for surveillance colonoscopy.  Consider perform colonoscopy in hospital setting considering severe COPD and other comorbidities.

## 2014-10-26 NOTE — Patient Instructions (Signed)
Please complete the following lab tests before your next follow up appointment: A1c - 250.00

## 2014-10-26 NOTE — Assessment & Plan Note (Signed)
Home blood sugar readings are stable.  Monitor A1c before next office visit.

## 2014-10-26 NOTE — Progress Notes (Signed)
Pre visit review using our clinic review tool, if applicable. No additional management support is needed unless otherwise documented below in the visit note. 

## 2014-10-26 NOTE — Progress Notes (Signed)
Subjective:    Patient ID: Ernest [name], male    DOB: 1956-01-26, 58 y.o.   MRN: [id number]  HPI  58 year old white male with history of lung cancer, severe COPD and type 2 diabetes complains of sore throat and laryngitis and cough for 2 weeks. Patient reports the symptoms started after exposure to a niece who had a bad cold.  Patient's cough is productive of whitish sputum. He also has sore throat and laryngitis. He also has mild shortness of breath.  Type 2 diabetes-patient monitors his blood sugar regularly.  Fasting blood sugar readings are usually in the 120s.  Patient also requests referral to gastroenterologist for follow-up colonoscopy.  He does not recall how many years it's been since his last colonoscopy.    Lab Results  Component Value Date   WBC 10.6* 10/21/2014   HGB 14.0 10/21/2014   HCT 44.3 10/21/2014   MCV 89.0 10/21/2014   PLT 144 10/21/2014   Lab Results  Component Value Date   NA 141 10/21/2014   K 4.1 10/21/2014   CL 98 07/18/2014   CO2 30* 10/21/2014   Lab Results  Component Value Date   CREATININE 0.8 10/21/2014   Lab Results  Component Value Date   ALT 28 10/21/2014   AST 20 10/21/2014   ALKPHOS 137 10/21/2014   BILITOT 0.35 10/21/2014     Review of Systems Negative for fever, positive shortness of breath    Past Medical History  Diagnosis Date  . CAD (coronary artery disease)     Left Main 30% stenosis, LAD 20 - 30 % stenosis, first and second diagonal branchesat 40 - 50%  stenosis with small arteries, circumflex had 30% stenosis in the large obtuse marginal, RCA at 70 - 80%  stenosis [not felt to be occlusive after evaluation with flow wire], distal 50 - 60% stenosis - [name][  . COPD (chronic obstructive pulmonary disease)     Dr. Baird Lyons  . Depression   . Anxiety   . Hyperlipidemia   . Chronic insomnia   . Gout   . GERD (gastroesophageal reflux disease)   . PVD (peripheral vascular disease)     (ABI 0.9 on  right and 0.89 on left)  severe left external iliac stenosis.  He  had successful stenting of his left external iliac per Dr. Burt Knack.  . DDD (degenerative disc disease)   . Hx of colonoscopy   . 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 OSA (obstructive sleep apnea)     NPSG 09/10/10- AHI 11.3/hr  . Hypertension     dr Percival Spanish  . History of radiation therapy 11/10/13- 12/29/13    left lung 6600 cGy in 33 sessions  . Cancer     lung  . Lung cancer 10/04/13    LUL squamous cell lung cancer    History   Social History  . Marital Status: Married    Spouse Name: N/A    Number of Children: N/A  . Years of Education: N/A   Occupational History  . Disabled welder    Social History Main Topics  . Smoking status: Former Smoker -- 0.50 packs/day for 43 years    Types: Cigarettes    Quit date: 09/23/2013  . Smokeless tobacco: Never Used     Comment: history of 3 PPD, 11/02/13   . Alcohol Use: No  . Drug Use: No  . Sexual Activity: No   Other Topics Concern  . Not on file   Social  History Narrative    Past Surgical History  Procedure Laterality Date  . Spinal fusion  03/05/2007    L4-L5  . Hip surgery      left  . Arm surgery      left  . Shoulder surgery      right  . C-spine surgery    . Angioplasty    . Video bronchoscopy with endobronchial navigation N/A 10/04/2013    Procedure: VIDEO BRONCHOSCOPY WITH ENDOBRONCHIAL NAVIGATION;  Surgeon: Collene Gobble, MD;  Location: [facility];  Service: Thoracic;  Laterality: N/A;  . Back surgery      Family History  Problem Relation Age of Onset  . Heart attack Mother   . Heart attack Sister   . Lung cancer Sister   . Cancer Sister     small cell lung, mets to brain  . Emphysema Sister   . Hypertension Brother     Allergies  Allergen Reactions  . Carboplatin Shortness Of Breath, Swelling and Rash    Swelling of lips, rash on face,eyes and head    Current Outpatient Prescriptions on File Prior to Visit  Medication Sig Dispense  Refill  . albuterol (PROVENTIL HFA) 108 (90 BASE) MCG/ACT inhaler 2 puffs every 4 hours if needed 2 Inhaler prn  . albuterol (PROVENTIL) (2.5 MG/3ML) 0.083% nebulizer solution INHALE CONTENTS OF 1 VIAL IN NEBULIZER EVERY 4 HOURS AS NEEDED FOR WHEEZING OR SHORTNESS OF BREATH 300 mL 1  . ALPRAZolam (XANAX) 0.25 MG tablet Take 0.25 mg by mouth daily as needed.    . ARIPiprazole (ABILIFY) 5 MG tablet Take 1 tablet (5 mg total) by mouth at bedtime.    . Armodafinil (NUVIGIL) 150 MG tablet Take 150 mg by mouth daily.    [name] atorvastatin (LIPITOR) 20 MG tablet TAKE 1 TABLET (20 MG TOTAL) BY MOUTH DAILY. 90 tablet 3  . clonazePAM (KLONOPIN) 1 MG tablet Take 1 mg by mouth at bedtime as needed. For anxiety/insomnia    . esomeprazole (NEXIUM) 40 MG capsule Take 40 mg by mouth daily.    [name] FLUoxetine (PROZAC) 40 MG capsule Take 40 mg by mouth daily.      . fluticasone (FLONASE) 50 MCG/ACT nasal spray Place 2 sprays into both nostrils daily. 16 g 3  . Fluticasone-Salmeterol (ADVAIR DISKUS) 500-50 MCG/DOSE AEPB 1 puff and rinse mouth, twice daily 60 each prn  . gabapentin (NEURONTIN) 100 MG capsule Take 100 mg by mouth daily.     [name] guaiFENesin-codeine 100-10 MG/5ML syrup Take 10 mLs by mouth every 4 (four) hours as needed for cough.    . Insulin Glargine (LANTUS SOLOSTAR) 100 UNIT/ML Solostar Pen 5 units at bedtime. 5 pen PRN  . Insulin Pen Needle (BD PEN NEEDLE NANO U/F) 32G X 4 MM MISC 1 each by Does not apply route daily. 100 each 3  . isosorbide mononitrate (IMDUR) 30 MG 24 hr tablet Take 1.5 tablets (45 mg total) by mouth daily. 45 tablet 6  . methocarbamol (ROBAXIN) 500 MG tablet Take 500 mg by mouth 2 (two) times daily as needed for muscle spasms.     . metoprolol succinate (TOPROL-XL) 25 MG 24 hr tablet TAKE 1/2 TABLET BY MOUTH DAILY 30 tablet 5  . mirtazapine (REMERON) 15 MG tablet Take 22.5 mg by mouth at bedtime.    [name] morphine (MS CONTIN) 60 MG 12 hr tablet Take 60 mg by mouth 2 (two) times daily.        . nitroGLYCERIN (NITROSTAT) 0.4 MG  SL tablet Place 1 tablet (0.4 mg total) under the tongue every 5 (five) minutes as needed for chest pain. 25 tablet 3  . Omega-3 Fatty Acids (FISH OIL CONCENTRATE) 1000 MG CAPS Take 1,000 mg by mouth daily.     . potassium chloride SA (K-DUR,KLOR-CON) 20 MEQ tablet Take 20 mEq by mouth daily.    . prochlorperazine (COMPAZINE) 10 MG tablet TAKE 1 TABLET BY MOUTH EVERY 6 HOURS AS NEEDED FOR NAUSEA AND VOMITING 60 tablet 3  . VOLTAREN 1 % GEL Apply 2 g topically daily as needed (for pain).      No current facility-administered medications on file prior to visit.    BP 148/84 mmHg  Pulse 73  Temp(Src) 98 F (36.7 C) (Oral)  Ht 5' 5.5" (1.664 m)  Wt 191 lb (86.637 kg)  BMI 31.29 kg/m2    Objective:   Physical Exam  Constitutional: He is oriented to person, place, and time. He appears well-developed and well-nourished.  HENT:  Head: Normocephalic and atraumatic.  Right Ear: External ear normal.  Left Ear: External ear normal.  Oropharyngeal erythema, with scattered tiny white spots  Neck: Neck supple.  Cardiovascular: Normal rate, regular rhythm and normal heart sounds.   No murmur heard. Pulmonary/Chest: Effort normal.  Expiratory wheeze  Lymphadenopathy:    He has no cervical adenopathy.  Neurological: He is alert and oriented to person, place, and time. No cranial nerve deficit.  Skin: Skin is warm and dry.  Psychiatric: He has a normal mood and affect. His behavior is normal.          Assessment & Plan:

## 2014-10-26 NOTE — Assessment & Plan Note (Signed)
Patient experiencing exacerbation after exposure to niece with viral upper respiratory infection. Patient complains of productive cough for 2 weeks.  He has scattered expiratory wheeze on exam. Treat with doxycycline 100 mg twice daily.  Continue Advair Diskus and albuterol as needed.  Patient advised to call office if symptoms persist or worsen.

## 2014-10-27 ENCOUNTER — Encounter: Payer: Self-pay | Admitting: Internal Medicine

## 2014-10-27 ENCOUNTER — Ambulatory Visit (HOSPITAL_BASED_OUTPATIENT_CLINIC_OR_DEPARTMENT_OTHER): Payer: Commercial Managed Care - HMO | Admitting: Internal Medicine

## 2014-10-27 ENCOUNTER — Telehealth: Payer: Self-pay | Admitting: Internal Medicine

## 2014-10-27 VITALS — BP 141/57 | HR 66 | Temp 98.0°F | Resp 18 | Ht 65.5 in | Wt 191.7 lb

## 2014-10-27 DIAGNOSIS — C3432 Malignant neoplasm of lower lobe, left bronchus or lung: Secondary | ICD-10-CM

## 2014-10-27 DIAGNOSIS — J449 Chronic obstructive pulmonary disease, unspecified: Secondary | ICD-10-CM

## 2014-10-27 DIAGNOSIS — C3492 Malignant neoplasm of unspecified part of left bronchus or lung: Secondary | ICD-10-CM

## 2014-10-27 NOTE — Telephone Encounter (Signed)
Spouse aware of recs. Order placed. Nothing further needed

## 2014-10-27 NOTE — Telephone Encounter (Signed)
Pt confirmed labs/ov per 12/03 POF, gave pt AVS.... KJ

## 2014-10-27 NOTE — Telephone Encounter (Signed)
[facility] re-order through [facility]-    Evaluate for lighter portable O2 3L/min. Portable concentrator if available.  Dx COPD mixed type, Lung Ca

## 2014-10-27 NOTE — Progress Notes (Signed)
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  PRIOR THERAPY:  1) Concurrent chemoradiation with weekly carboplatin for AUC of 2 and paclitaxel 45 mg/M2, status post 7 cycles, last dose was given 12/20/2013. First dose on 11/01/2013. 2) Consolidation chemotherapy with carboplatin for AUC of 5 and paclitaxel 175 mg/M2 every 3 weeks with Neulasta support. First cycle on 02/08/2014. Status post 3 cycles and carboplatin was discontinued secondary to allergic reaction.    CURRENT THERAPY: Observation  CHEMOTHERAPY INTENT: Curative/control  CURRENT # OF CHEMOTHERAPY CYCLES: 0  CURRENT ANTIEMETICS: Zofran, dexamethasone and Compazine  CURRENT SMOKING STATUS: Former smoker  ORAL CHEMOTHERAPY AND CONSENT: None  CURRENT BISPHOSPHONATES USE: None  PAIN MANAGEMENT: 0/10  NARCOTICS INDUCED CONSTIPATION: None.  LIVING WILL AND CODE STATUS: Full code.   INTERVAL HISTORY: Ernest [name] 58 y.o. male returns to the clinic today for three-month followup visit accompanied by his wife. The patient continues to complain of shortness of breath with exertion and mild dry cough. He also has flulike symptoms and chest congestion as well as hoarseness of his voice recently. He was seen by his primary care physician and started on treatment with antibiotics with Levaquin. He denied having any significant chest pain or hemoptysis. He denied having any nausea or vomiting, no fever or chills. The patient denied having any current peripheral neuropathy. He had repeat CT scan of the chest performed recently and he is here for evaluation and discussion of his scan results.  MEDICAL  HISTORY: Past Medical History  Diagnosis Date  . CAD (coronary artery disease)     Left Main 30% stenosis, LAD 20 - 30 % stenosis, first and second diagonal branchesat 40 - 50%  stenosis with small arteries, circumflex had 30% stenosis in the large obtuse marginal, RCA at 70 - 80%  stenosis [not felt to be occlusive after evaluation with flow wire], distal 50 - 60% stenosis - [name][  . COPD (chronic obstructive pulmonary disease)     Dr. Baird Lyons  . Depression   . Anxiety   . Hyperlipidemia   . Chronic insomnia   . Gout   . GERD (gastroesophageal reflux disease)   . PVD (peripheral vascular disease)     (ABI 0.9 on right and 0.89 on left)  severe left external iliac stenosis.  He  had successful stenting of his left external iliac per Dr. Burt Knack.  . DDD (degenerative disc disease)   . Hx of colonoscopy   . 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 OSA (obstructive sleep apnea)     NPSG 09/10/10- AHI 11.3/hr  . Hypertension     dr Percival Spanish  . History of radiation therapy 11/10/13- 12/29/13    left lung 6600 cGy in 33 sessions  . Cancer     lung  . Lung cancer 10/04/13    LUL squamous cell lung cancer    ALLERGIES:  is allergic to carboplatin.  MEDICATIONS:  Current Outpatient Prescriptions  Medication Sig Dispense Refill  . albuterol (PROVENTIL HFA) 108 (90 BASE) MCG/ACT inhaler 2 puffs every 4 hours if needed 2 Inhaler prn  . albuterol (PROVENTIL) (2.5 MG/3ML) 0.083% nebulizer solution INHALE CONTENTS OF 1 VIAL IN NEBULIZER EVERY 4 HOURS AS  NEEDED FOR WHEEZING OR SHORTNESS OF BREATH 300 mL 1  . ALPRAZolam (XANAX) 0.25 MG tablet Take 0.25 mg by mouth daily as needed.    . ARIPiprazole (ABILIFY) 5 MG tablet Take 1 tablet (5 mg total) by mouth at bedtime.    . Armodafinil (NUVIGIL) 150 MG tablet Take 150 mg by mouth daily.    [name] atorvastatin (LIPITOR) 20 MG tablet TAKE 1 TABLET (20 MG TOTAL) BY MOUTH DAILY. 90 tablet 3  . clonazePAM (KLONOPIN) 1 MG tablet Take 2 mg by mouth at  bedtime as needed. For anxiety/insomnia    . doxycycline (VIBRA-TABS) 100 MG tablet Take 1 tablet (100 mg total) by mouth 2 (two) times daily. 20 tablet 0  . esomeprazole (NEXIUM) 40 MG capsule Take 40 mg by mouth daily.    [name] FLUoxetine (PROZAC) 40 MG capsule Take 40 mg by mouth daily.      . fluticasone (FLONASE) 50 MCG/ACT nasal spray Place 2 sprays into both nostrils daily. 16 g 3  . Fluticasone-Salmeterol (ADVAIR DISKUS) 500-50 MCG/DOSE AEPB 1 puff and rinse mouth, twice daily 60 each prn  . gabapentin (NEURONTIN) 100 MG capsule Take 100 mg by mouth daily.     [name] guaiFENesin-codeine 100-10 MG/5ML syrup Take 10 mLs by mouth every 4 (four) hours as needed for cough.    . Insulin Glargine (LANTUS SOLOSTAR) 100 UNIT/ML Solostar Pen 5 units at bedtime. 5 pen PRN  . Insulin Pen Needle (BD PEN NEEDLE NANO U/F) 32G X 4 MM MISC 1 each by Does not apply route daily. 100 each 3  . isosorbide mononitrate (IMDUR) 30 MG 24 hr tablet Take 1.5 tablets (45 mg total) by mouth daily. 45 tablet 6  . methocarbamol (ROBAXIN) 500 MG tablet Take 500 mg by mouth 2 (two) times daily as needed for muscle spasms.     . metoprolol succinate (TOPROL-XL) 25 MG 24 hr tablet TAKE 1/2 TABLET BY MOUTH DAILY 30 tablet 5  . mirtazapine (REMERON) 15 MG tablet Take 22.5 mg by mouth at bedtime.    [name] morphine (MS CONTIN) 60 MG 12 hr tablet Take 60 mg by mouth 2 (two) times daily.      . nitroGLYCERIN (NITROSTAT) 0.4 MG SL tablet Place 1 tablet (0.4 mg total) under the tongue every 5 (five) minutes as needed for chest pain. 25 tablet 3  . Omega-3 Fatty Acids (FISH OIL CONCENTRATE) 1000 MG CAPS Take 1,000 mg by mouth daily.     . potassium chloride SA (K-DUR,KLOR-CON) 20 MEQ tablet Take 20 mEq by mouth daily.    . prochlorperazine (COMPAZINE) 10 MG tablet TAKE 1 TABLET BY MOUTH EVERY 6 HOURS AS NEEDED FOR NAUSEA AND VOMITING 60 tablet 3  . VOLTAREN 1 % GEL Apply 2 g topically daily as needed (for pain).      No current  facility-administered medications for this visit.    SURGICAL HISTORY:  Past Surgical History  Procedure Laterality Date  . Spinal fusion  03/05/2007    L4-L5  . Hip surgery      left  . Arm surgery      left  . Shoulder surgery      right  . C-spine surgery    . Angioplasty    . Video bronchoscopy with endobronchial navigation N/A 10/04/2013    Procedure: VIDEO BRONCHOSCOPY WITH ENDOBRONCHIAL NAVIGATION;  Surgeon: Collene Gobble, MD;  Location: [facility];  Service: Thoracic;  Laterality: N/A;  . Back surgery  REVIEW OF SYSTEMS:  A comprehensive review of systems was negative except for: Constitutional: positive for fatigue Respiratory: positive for cough, dyspnea on exertion and sputum   PHYSICAL EXAMINATION: General appearance: alert, cooperative and no distress Head: Normocephalic, without obvious abnormality, atraumatic Neck: no adenopathy, no JVD, supple, symmetrical, trachea midline and thyroid not enlarged, symmetric, no tenderness/mass/nodules Lymph nodes: Cervical, supraclavicular, and axillary nodes normal. Resp: wheezes bilaterally Back: symmetric, no curvature. ROM normal. No CVA tenderness. Cardio: regular rate and rhythm, S1, S2 normal, no murmur, click, rub or gallop GI: soft, non-tender; bowel sounds normal; no masses,  no organomegaly Extremities: extremities normal, atraumatic, no cyanosis or edema Neurologic: Alert and oriented X 3, normal strength and tone. Normal symmetric reflexes. Normal coordination and gait  ECOG PERFORMANCE STATUS: 1 - Symptomatic but completely ambulatory  Blood pressure 141/57, pulse 66, temperature 98 F (36.7 C), temperature source Oral, resp. rate 18, height 5' 5.5" (1.664 m), weight 191 lb 11.2 oz (86.955 kg).  LABORATORY DATA: Lab Results  Component Value Date   WBC 10.6* 10/21/2014   HGB 14.0 10/21/2014   HCT 44.3 10/21/2014   MCV 89.0 10/21/2014   PLT 144 10/21/2014      Chemistry      Component Value Date/Time    NA 141 10/21/2014 0852   NA 136 07/18/2014 1558   K 4.1 10/21/2014 0852   K 4.3 07/18/2014 1558   CL 98 07/18/2014 1558   CO2 30* 10/21/2014 0852   CO2 30 07/18/2014 1558   BUN 11.3 10/21/2014 0852   BUN 10 07/18/2014 1558   CREATININE 0.8 10/21/2014 0852   CREATININE 0.8 07/18/2014 1558      Component Value Date/Time   CALCIUM 9.7 10/21/2014 0852   CALCIUM 9.2 07/18/2014 1558   ALKPHOS 137 10/21/2014 0852   ALKPHOS 125* 07/18/2014 1558   AST 20 10/21/2014 0852   AST 21 07/18/2014 1558   ALT 28 10/21/2014 0852   ALT 16 07/18/2014 1558   BILITOT 0.35 10/21/2014 0852   BILITOT 0.3 07/18/2014 1558       RADIOGRAPHIC STUDIES: Ct Chest W Contrast  10/21/2014   CLINICAL DATA:  Followup left lung squamous cell carcinoma. Recently completed chemotherapy. Prior radiation therapy. Restaging.  EXAM: CT CHEST WITH CONTRAST  TECHNIQUE: Multidetector CT imaging of the chest was performed during intravenous contrast administration.  CONTRAST:  61mL OMNIPAQUE IOHEXOL 300 MG/ML  SOLN  COMPARISON:  07/19/2014  FINDINGS: Mediastinum/Hilar Regions: No masses or pathologically enlarged lymph nodes identified.  Other Thoracic Lymphadenopathy:  None.  Lungs: Poorly defined confluent opacity in the central left upper lobe with internal areas of cavitation and air bronchograms shows no significant change compared to previous study. This is felt represent postradiation change and obscures the central left upper lobe mass seen on original CT. There is increased airspace disease with air bronchograms seen in the adjacent superior segment of the left lower lobe, also felt to represent radiation changes.  New ill-defined confluent opacity is seen in the right lower lobe since prior study, which is likely infectious or inflammatory in etiology.  Pleura:  No evidence of effusion or mass.  Vascular/Cardiac:  No acute findings identified.  Other: Low-attenuation lesion in the inferior aspect of the spleen remains  stable, likely representing a benign hemangioma or lymphangioma.  Musculoskeletal: No suspicious bone lesions identified. Benign-appearing fracture of the left anterior first rib noted, which may be due to radiation osteonecrosis.  IMPRESSION: Stable ill-defined confluent opacity in the central left upper  lobe, and increased airspace disease in adjacent superior segment of left lower lobe, likely due to post radiation changes.  New patchy confluent opacity in right lower lobe, suspicious for infectious or inflammatory etiology.  No evidence of lymphadenopathy or other definite signs of progressive neoplasm.   Electronically Signed   By: Earle Gell M.D.   On: 10/21/2014 10:03    ASSESSMENT AND PLAN: This is a very pleasant 58 years old white male with stage IIIA non-small cell lung cancer, squamous cell carcinoma currently undergoing concurrent chemoradiation with weekly carboplatin and paclitaxel status post 7 weeks of treatment. He tolerated his treatment fairly well with no significant adverse effects. This was followed by consolidation chemotherapy with carboplatin and paclitaxel status post 3 cycles, .carboplatin was discontinued after cycle #2 secondary to hypersensitivity reaction. The recent CT scan of the chest showed stable disease in the central left upper lobe with increased airspace disease in the adjacent superior segment of the left lower lobe likely due to postradiation changes.  I discussed the scan results with the patient and his wife. For the second time in a row the patient and his wife are wondering if his cancer had disappeared. I explained to the patient and his wife that this is unlikely at this point and we will continue close monitoring to evaluate for any progression of his disease. I spent a long time explaining to the patient and his wife his current condition and explained to them the percentage of cure and his condition is very small. I recommended for him to continue on  observation with repeat CT scan of the chest in 3 months. He was advised to call immediately if he has any concerning symptoms in the interval. The patient voices understanding of current disease status and treatment options and is in agreement with the current care plan.  All questions were answered. The patient knows to call the clinic with any problems, questions or concerns. We can certainly see the patient much sooner if necessary.  Disclaimer: This note was dictated with voice recognition software. Similar sounding words can inadvertently be transcribed and may not be corrected upon review.

## 2014-10-27 NOTE — Telephone Encounter (Signed)
Pt gets 02 from [facility], was brought 2 02 tanks in bags that pt has difficulty carrying around.  Pt uses 3lpm continuous, was under the impression that he would be getting a "regenerative 02 tank" that is smaller in size.  Wants to make sure that he received what was ordered, and see if he can get a smaller poc.    Dr. Annamaria Boots please advise.  Thank you.  Allergies  Allergen Reactions  . Carboplatin Shortness Of Breath, Swelling and Rash    Swelling of lips, rash on face,eyes and head   Current Outpatient Prescriptions on File Prior to Visit  Medication Sig Dispense Refill  . albuterol (PROVENTIL HFA) 108 (90 BASE) MCG/ACT inhaler 2 puffs every 4 hours if needed 2 Inhaler prn  . albuterol (PROVENTIL) (2.5 MG/3ML) 0.083% nebulizer solution INHALE CONTENTS OF 1 VIAL IN NEBULIZER EVERY 4 HOURS AS NEEDED FOR WHEEZING OR SHORTNESS OF BREATH 300 mL 1  . ALPRAZolam (XANAX) 0.25 MG tablet Take 0.25 mg by mouth daily as needed.    . ARIPiprazole (ABILIFY) 5 MG tablet Take 1 tablet (5 mg total) by mouth at bedtime.    . Armodafinil (NUVIGIL) 150 MG tablet Take 150 mg by mouth daily.    [name] atorvastatin (LIPITOR) 20 MG tablet TAKE 1 TABLET (20 MG TOTAL) BY MOUTH DAILY. 90 tablet 3  . clonazePAM (KLONOPIN) 1 MG tablet Take 2 mg by mouth at bedtime as needed. For anxiety/insomnia    . doxycycline (VIBRA-TABS) 100 MG tablet Take 1 tablet (100 mg total) by mouth 2 (two) times daily. 20 tablet 0  . esomeprazole (NEXIUM) 40 MG capsule Take 40 mg by mouth daily.    [name] FLUoxetine (PROZAC) 40 MG capsule Take 40 mg by mouth daily.      . fluticasone (FLONASE) 50 MCG/ACT nasal spray Place 2 sprays into both nostrils daily. 16 g 3  . Fluticasone-Salmeterol (ADVAIR DISKUS) 500-50 MCG/DOSE AEPB 1 puff and rinse mouth, twice daily 60 each prn  . gabapentin (NEURONTIN) 100 MG capsule Take 100 mg by mouth daily.     [name] guaiFENesin-codeine 100-10 MG/5ML syrup Take 10 mLs by mouth every 4 (four) hours as needed for cough.    .  Insulin Glargine (LANTUS SOLOSTAR) 100 UNIT/ML Solostar Pen 5 units at bedtime. 5 pen PRN  . Insulin Pen Needle (BD PEN NEEDLE NANO U/F) 32G X 4 MM MISC 1 each by Does not apply route daily. 100 each 3  . isosorbide mononitrate (IMDUR) 30 MG 24 hr tablet Take 1.5 tablets (45 mg total) by mouth daily. 45 tablet 6  . methocarbamol (ROBAXIN) 500 MG tablet Take 500 mg by mouth 2 (two) times daily as needed for muscle spasms.     . metoprolol succinate (TOPROL-XL) 25 MG 24 hr tablet TAKE 1/2 TABLET BY MOUTH DAILY 30 tablet 5  . mirtazapine (REMERON) 15 MG tablet Take 22.5 mg by mouth at bedtime.    [name] morphine (MS CONTIN) 60 MG 12 hr tablet Take 60 mg by mouth 2 (two) times daily.      . nitroGLYCERIN (NITROSTAT) 0.4 MG SL tablet Place 1 tablet (0.4 mg total) under the tongue every 5 (five) minutes as needed for chest pain. 25 tablet 3  . Omega-3 Fatty Acids (FISH OIL CONCENTRATE) 1000 MG CAPS Take 1,000 mg by mouth daily.     . potassium chloride SA (K-DUR,KLOR-CON) 20 MEQ tablet Take 20 mEq by mouth daily.    . prochlorperazine (COMPAZINE) 10 MG tablet TAKE  1 TABLET BY MOUTH EVERY 6 HOURS AS NEEDED FOR NAUSEA AND VOMITING 60 tablet 3  . VOLTAREN 1 % GEL Apply 2 g topically daily as needed (for pain).      No current facility-administered medications on file prior to visit.

## 2014-10-31 ENCOUNTER — Telehealth: Payer: Self-pay | Admitting: Internal Medicine

## 2014-10-31 ENCOUNTER — Other Ambulatory Visit: Payer: Self-pay | Admitting: Internal Medicine

## 2014-10-31 MED ORDER — PREDNISONE 10 MG PO TABS: ORAL_TABLET | ORAL | Status: DC

## 2014-10-31 NOTE — Telephone Encounter (Signed)
Pt still has a pretty bad sore throat, can not talk. And wife wants to know what else can be done, if anything.

## 2014-10-31 NOTE — Telephone Encounter (Signed)
rx for prednisone sent to his pharmacy

## 2014-11-01 NOTE — Telephone Encounter (Signed)
Pt aware.

## 2014-11-02 ENCOUNTER — Encounter: Payer: Self-pay | Admitting: Internal Medicine

## 2014-11-04 ENCOUNTER — Telehealth: Payer: Self-pay | Admitting: Internal Medicine

## 2014-11-04 NOTE — Telephone Encounter (Signed)
If he is not improving with current Abx and prendisone, then he would need to go to hospital.

## 2014-11-04 NOTE — Telephone Encounter (Signed)
Called and spoke to pt's wife. Informed pt of the recs per VS. Pt's wife verbalized understanding and denied any further questions or concerns at this time.

## 2014-11-04 NOTE — Telephone Encounter (Signed)
Spoke with pt's wife, states that pt has had a cold for a couple of weeks and is currently on abx and prednisone for this.  States that pt woke up last night with sob and chest soreness.  EMS came to the house but did not bring pt to the hospital.  Today he is still experiencing prod cough with white mucus, chest soreness, sob.  Wife is wanting to know if there is anything more they need to be doing for him.  Last ov: 10/19/14 with cy Next ov: 01/19/15 with cy  Sending to doc of day as CY is out.  Please advise.  Thank you.

## 2014-11-07 ENCOUNTER — Telehealth: Payer: Self-pay | Admitting: Internal Medicine

## 2014-11-07 MED ORDER — PREDNISONE 10 MG PO TABS: ORAL_TABLET | ORAL | Status: DC

## 2014-11-07 NOTE — Telephone Encounter (Signed)
Pt wife aware of rec's per CY. Pred taper sent to [facility]. Nothing further needed.

## 2014-11-07 NOTE — Telephone Encounter (Signed)
Called spoke with spouse. She reports pt is feeling more SOB, prod cough (white phlem), wheezing, chest tx. Pt has finished doxy and still not better. Please advise CDY thanks  Allergies  Allergen Reactions  . Carboplatin Shortness Of Breath, Swelling and Rash    Swelling of lips, rash on face,eyes and head     Current Outpatient Prescriptions on File Prior to Visit  Medication Sig Dispense Refill  . albuterol (PROVENTIL HFA) 108 (90 BASE) MCG/ACT inhaler 2 puffs every 4 hours if needed 2 Inhaler prn  . albuterol (PROVENTIL) (2.5 MG/3ML) 0.083% nebulizer solution INHALE CONTENTS OF 1 VIAL IN NEBULIZER EVERY 4 HOURS AS NEEDED FOR WHEEZING OR SHORTNESS OF BREATH 300 mL 1  . ALPRAZolam (XANAX) 0.25 MG tablet Take 0.25 mg by mouth daily as needed.    . ARIPiprazole (ABILIFY) 5 MG tablet Take 1 tablet (5 mg total) by mouth at bedtime.    . Armodafinil (NUVIGIL) 150 MG tablet Take 150 mg by mouth daily.    [name] atorvastatin (LIPITOR) 20 MG tablet TAKE 1 TABLET (20 MG TOTAL) BY MOUTH DAILY. 90 tablet 3  . clonazePAM (KLONOPIN) 1 MG tablet Take 2 mg by mouth at bedtime as needed. For anxiety/insomnia    . doxycycline (VIBRA-TABS) 100 MG tablet Take 1 tablet (100 mg total) by mouth 2 (two) times daily. 20 tablet 0  . esomeprazole (NEXIUM) 40 MG capsule Take 40 mg by mouth daily.    [name] FLUoxetine (PROZAC) 40 MG capsule Take 40 mg by mouth daily.      . fluticasone (FLONASE) 50 MCG/ACT nasal spray Place 2 sprays into both nostrils daily. 16 g 3  . Fluticasone-Salmeterol (ADVAIR DISKUS) 500-50 MCG/DOSE AEPB 1 puff and rinse mouth, twice daily 60 each prn  . gabapentin (NEURONTIN) 100 MG capsule Take 100 mg by mouth daily.     [name] guaiFENesin-codeine 100-10 MG/5ML syrup Take 10 mLs by mouth every 4 (four) hours as needed for cough.    . Insulin Glargine (LANTUS SOLOSTAR) 100 UNIT/ML Solostar Pen 5 units at bedtime. 5 pen PRN  . Insulin Pen Needle (BD PEN NEEDLE NANO U/F) 32G X 4 MM MISC 1 each by Does not  apply route daily. 100 each 3  . isosorbide mononitrate (IMDUR) 30 MG 24 hr tablet Take 1.5 tablets (45 mg total) by mouth daily. 45 tablet 6  . methocarbamol (ROBAXIN) 500 MG tablet Take 500 mg by mouth 2 (two) times daily as needed for muscle spasms.     . metoprolol succinate (TOPROL-XL) 25 MG 24 hr tablet TAKE 1/2 TABLET BY MOUTH DAILY 30 tablet 5  . mirtazapine (REMERON) 15 MG tablet Take 22.5 mg by mouth at bedtime.    [name] morphine (MS CONTIN) 60 MG 12 hr tablet Take 60 mg by mouth 2 (two) times daily.      . nitroGLYCERIN (NITROSTAT) 0.4 MG SL tablet Place 1 tablet (0.4 mg total) under the tongue every 5 (five) minutes as needed for chest pain. 25 tablet 3  . Omega-3 Fatty Acids (FISH OIL CONCENTRATE) 1000 MG CAPS Take 1,000 mg by mouth daily.     . potassium chloride SA (K-DUR,KLOR-CON) 20 MEQ tablet Take 20 mEq by mouth daily.    . predniSONE (DELTASONE) 10 MG tablet Take 3 tabs for 3 days, then 2 tabs for 3 days, then 1 tab for 3 days 18 tablet 0  . prochlorperazine (COMPAZINE) 10 MG tablet TAKE 1 TABLET BY MOUTH EVERY 6 HOURS AS NEEDED FOR NAUSEA  AND VOMITING 60 tablet 3  . VOLTAREN 1 % GEL Apply 2 g topically daily as needed (for pain).      No current facility-administered medications on file prior to visit.

## 2014-11-07 NOTE — Telephone Encounter (Signed)
Unles they want to try something else, best bet would be to do a longer prednisone taper  Prednisone 10 mg, # 42  ,     6 x 2 days, 5 x 2 days, 4 x 2 days, 3 x 2 days, 2 x 2 days, 1 x 2 days  Mucinex-DM may help

## 2014-11-09 ENCOUNTER — Ambulatory Visit: Payer: Commercial Managed Care - HMO | Admitting: Cardiology

## 2014-11-11 ENCOUNTER — Encounter: Payer: Self-pay | Admitting: Cardiology

## 2014-11-11 ENCOUNTER — Ambulatory Visit (INDEPENDENT_AMBULATORY_CARE_PROVIDER_SITE_OTHER): Payer: Commercial Managed Care - HMO | Admitting: Cardiology

## 2014-11-11 VITALS — BP 144/64 | HR 70 | Ht 66.0 in | Wt 191.8 lb

## 2014-11-11 DIAGNOSIS — I2510 Atherosclerotic heart disease of native coronary artery without angina pectoris: Secondary | ICD-10-CM

## 2014-11-11 DIAGNOSIS — I739 Peripheral vascular disease, unspecified: Secondary | ICD-10-CM

## 2014-11-11 DIAGNOSIS — I10 Essential (primary) hypertension: Secondary | ICD-10-CM

## 2014-11-11 MED ORDER — ISOSORBIDE MONONITRATE ER 60 MG PO TB24: 60.0000 mg | ORAL_TABLET | Freq: Every day | ORAL | Status: DC

## 2014-11-11 NOTE — Patient Instructions (Signed)
Your physician recommends that you schedule a follow-up appointment in: one tear with Dr. Percival Spanish  Increase your Imdur to 60 mg daily

## 2014-11-11 NOTE — Progress Notes (Signed)
HPI The patient presents for follow of known coronary disease.  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 He has been having chest discomfort. He did have a stress perfusion study at the end of last year for chest discomfort which was negative for any evidence of ischemia and showed a well preserved ejection fraction.   The discomfort that he has had the last couple of visits has been a discomfort with deep breathing or coughing. He has a cough but can get a sputum. For pain baseline dyspnea. He actually called EMS because he had a episode of shortness of breath it responded to bronchodilators. He did not want to go to the hospital. He sleeps in a recliner with his oxygen on. He reports he is not smoking. He's not had any cough fevers or chills.  He's not describing PND or orthopnea. He's had no palpitations, presyncope or syncope. He does have some soreness again with deep breathing or even certain movements.    Allergies  Allergen Reactions  . Carboplatin Shortness Of Breath, Swelling and Rash    Swelling of lips, rash on face,eyes and head    Current Outpatient Prescriptions  Medication Sig Dispense Refill  . albuterol (PROVENTIL HFA) 108 (90 BASE) MCG/ACT inhaler 2 puffs every 4 hours if needed 2 Inhaler prn  . albuterol (PROVENTIL) (2.5 MG/3ML) 0.083% nebulizer solution INHALE CONTENTS OF 1 VIAL IN NEBULIZER EVERY 4 HOURS AS NEEDED FOR WHEEZING OR SHORTNESS OF BREATH 300 mL 1  . ALPRAZolam (XANAX) 0.25 MG tablet Take 0.25 mg by mouth daily as needed.    . ARIPiprazole (ABILIFY) 5 MG tablet Take 1 tablet (5 mg total) by mouth at bedtime.    . Armodafinil (NUVIGIL) 150 MG tablet Take 150 mg by mouth daily.    [name] atorvastatin (LIPITOR) 20 MG tablet TAKE 1 TABLET (20 MG TOTAL) BY MOUTH DAILY. 90 tablet 3  . clonazePAM (KLONOPIN) 1 MG tablet Take 2 mg by mouth at bedtime as needed. For anxiety/insomnia    .  doxycycline (VIBRA-TABS) 100 MG tablet Take 1 tablet (100 mg total) by mouth 2 (two) times daily. 20 tablet 0  . esomeprazole (NEXIUM) 40 MG capsule Take 40 mg by mouth daily.    [name] FLUoxetine (PROZAC) 40 MG capsule Take 40 mg by mouth daily.      . fluticasone (FLONASE) 50 MCG/ACT nasal spray Place 2 sprays into both nostrils daily. 16 g 3  . Fluticasone-Salmeterol (ADVAIR DISKUS) 500-50 MCG/DOSE AEPB 1 puff and rinse mouth, twice daily 60 each prn  . gabapentin (NEURONTIN) 100 MG capsule Take 100 mg by mouth daily.     [name] guaiFENesin-codeine 100-10 MG/5ML syrup Take 10 mLs by mouth every 4 (four) hours as needed for cough.    . Insulin Glargine (LANTUS SOLOSTAR) 100 UNIT/ML Solostar Pen 5 units at bedtime. 5 pen PRN  . Insulin Pen Needle (BD PEN NEEDLE NANO U/F) 32G X 4 MM MISC 1 each by Does not apply route daily. 100 each 3  . isosorbide mononitrate (IMDUR) 30 MG 24 hr tablet Take 1.5 tablets (45 mg total) by mouth daily. 45 tablet 6  . methocarbamol (ROBAXIN) 500 MG tablet Take 500 mg by mouth 2 (two) times daily as needed for muscle spasms.     . metoprolol succinate (TOPROL-XL) 25 MG 24 hr tablet TAKE 1/2 TABLET BY MOUTH DAILY 30 tablet 5  . mirtazapine (REMERON)  15 MG tablet Take 22.5 mg by mouth at bedtime.    [name] morphine (MS CONTIN) 60 MG 12 hr tablet Take 60 mg by mouth 2 (two) times daily.      . nitroGLYCERIN (NITROSTAT) 0.4 MG SL tablet Place 1 tablet (0.4 mg total) under the tongue every 5 (five) minutes as needed for chest pain. 25 tablet 3  . Omega-3 Fatty Acids (FISH OIL CONCENTRATE) 1000 MG CAPS Take 1,000 mg by mouth daily.     . predniSONE (DELTASONE) 10 MG tablet Take 3 tabs for 3 days, then 2 tabs for 3 days, then 1 tab for 3 days 18 tablet 0  . prochlorperazine (COMPAZINE) 10 MG tablet TAKE 1 TABLET BY MOUTH EVERY 6 HOURS AS NEEDED FOR NAUSEA AND VOMITING 60 tablet 3  . VOLTAREN 1 % GEL Apply 2 g topically daily as needed (for pain).     . potassium chloride SA  (K-DUR,KLOR-CON) 20 MEQ tablet Take 20 mEq by mouth daily.     No current facility-administered medications for this visit.    Past Medical History  Diagnosis Date  . CAD (coronary artery disease)     Left Main 30% stenosis, LAD 20 - 30 % stenosis, first and second diagonal branchesat 40 - 50%  stenosis with small arteries, circumflex had 30% stenosis in the large obtuse marginal, RCA at 70 - 80%  stenosis [not felt to be occlusive after evaluation with flow wire], distal 50 - 60% stenosis - Amarion Portell[  . COPD (chronic obstructive pulmonary disease)     Dr. Baird Lyons  . Depression   . Anxiety   . Hyperlipidemia   . Chronic insomnia   . Gout   . GERD (gastroesophageal reflux disease)   . PVD (peripheral vascular disease)     (ABI 0.9 on right and 0.89 on left)  severe left external iliac stenosis.  He  had successful stenting of his left external iliac per Dr. Burt Knack.  . DDD (degenerative disc disease)   . Hx of colonoscopy   . 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 OSA (obstructive sleep apnea)     NPSG 09/10/10- AHI 11.3/hr  . Hypertension     dr Percival Spanish  . History of radiation therapy 11/10/13- 12/29/13    left lung 6600 cGy in 33 sessions  . Cancer     lung  . Lung cancer 10/04/13    [facility] squamous cell lung cancer    Past Surgical History  Procedure Laterality Date  . Spinal fusion  03/05/2007    L4-L5  . Hip surgery      left  . Arm surgery      left  . Shoulder surgery      right  . C-spine surgery    . Angioplasty    . Video bronchoscopy with endobronchial navigation N/A 10/04/2013    Procedure: VIDEO BRONCHOSCOPY WITH ENDOBRONCHIAL NAVIGATION;  Surgeon: Collene Gobble, MD;  Location: [facility];  Service: Thoracic;  Laterality: N/A;  . Back surgery      ROS:  Hoarseness.  Otherwise as stated in the HPI and negative for all other systems.  PHYSICAL EXAM BP 144/64 mmHg  Pulse 70  Ht 5\' 6"  (1.676 m)  Wt 191 lb 12.8 oz (87 kg)  BMI 30.97 kg/m2 GENERAL:  Well  appearing HEENT:  Pupils equal round and reactive, fundi not visualized, oral mucosa unremarkable, dentures NECK:  No jugular venous distention, waveform within normal limits, carotid upstroke brisk and symmetric, no  bruits, no thyromegaly LYMPHATICS:  No cervical, inguinal adenopathy LUNGS:  Decreased breath sounds bilaterally BACK:  No CVA tenderness CHEST:  Unremarkable HEART:  PMI not displaced or sustained,S1 and S2 within normal limits, no S3, no S4, no clicks, no rubs, no murmurs ABD:  Flat, positive bowel sounds normal in frequency in pitch, no bruits, no rebound, no guarding, no midline pulsatile mass, no hepatomegaly, no splenomegaly EXT:  2 plus pulses upper, DP/PT left 2 plus, absent DP/PT right, no edema, no cyanosis no clubbing SKIN:  No rashes no nodules   EKG:  Sinus rhythm, rate 68, axis within normal limits, intervals within normal limits, no acute ST-T wave changes.  11/11/2014   ASSESSMENT AND PLAN  CORONARY ATHEROSCLEROSIS NATIVE CORONARY ARTERY -  The patient's chest pain has some atypical features greater than typical features. It is very pleuritic in nature. At this point I don't think that further cardiovascular testing is indicated.  I will increase his Imdur to 60 mg just for ease of administration.  HYPERTENSION -  Blood pressure is slightly elevated. I will continue the meds as listed.  UNSPECIFIED PERIPHERAL VASCULAR DISEASE -  He will continue with risk reduction.  PURE HYPERCHOLESTEROLEMIA -  His LDL was 62. He will continue meds as listed.  TOBACCO USER -  He is not smoking.

## 2014-11-14 ENCOUNTER — Telehealth: Payer: Self-pay | Admitting: *Deleted

## 2014-11-14 ENCOUNTER — Telehealth: Payer: Self-pay | Admitting: Internal Medicine

## 2014-11-14 NOTE — Telephone Encounter (Signed)
Ok to see me or TP, routine visit slot

## 2014-11-14 NOTE — Telephone Encounter (Signed)
Pt's wife called stating that pt has been having increased SOB and pain in his chest.  She was not sure if she was supposed to call his pulmonologist or Dr Vista Mink.  Informed her that his pulmonologist should evaluate him first and then if needed Dr Vista Mink can see him sooner then f/u in march 2016.  She verbalized understanding.

## 2014-11-14 NOTE — Telephone Encounter (Signed)
Spoke witht he pt's spouse and scheduled ov with TP for tomorrow at 9:45 am tomorrow

## 2014-11-14 NOTE — Telephone Encounter (Signed)
Called and spoke to pt's wife. Pt c/o increase in SOB, life side pain that radiates to left arm, chest congestion and non prod cough. Pt stated he hasn't noticed much improvement from pred taper that was given. Pt saw cardiology on 11/11/14.   Dr. Annamaria Boots please advise if ok to work pt in.   Allergies  Allergen Reactions  . Carboplatin Shortness Of Breath, Swelling and Rash    Swelling of lips, rash on face,eyes and head

## 2014-11-15 ENCOUNTER — Ambulatory Visit (INDEPENDENT_AMBULATORY_CARE_PROVIDER_SITE_OTHER)
Admission: RE | Admit: 2014-11-15 | Discharge: 2014-11-15 | Disposition: A | Discharge status: Home / Self Care | Payer: Commercial Managed Care - HMO | Source: Ambulatory Visit | Attending: Adult Health | Admitting: Adult Health

## 2014-11-15 ENCOUNTER — Ambulatory Visit (INDEPENDENT_AMBULATORY_CARE_PROVIDER_SITE_OTHER): Payer: Commercial Managed Care - HMO | Admitting: Adult Health

## 2014-11-15 ENCOUNTER — Encounter: Payer: Self-pay | Admitting: Adult Health

## 2014-11-15 ENCOUNTER — Telehealth: Payer: Self-pay | Admitting: Internal Medicine

## 2014-11-15 VITALS — BP 116/58 | HR 80 | Temp 98.1°F | Ht 66.0 in | Wt 195.6 lb

## 2014-11-15 DIAGNOSIS — J441 Chronic obstructive pulmonary disease with (acute) exacerbation: Secondary | ICD-10-CM

## 2014-11-15 MED ORDER — METHYLPREDNISOLONE ACETATE 80 MG/ML IJ SUSP
120.0000 mg | Freq: Once | INTRAMUSCULAR | Status: AC
  Administered 2014-11-15: 120 mg via INTRAMUSCULAR

## 2014-11-15 MED ORDER — LEVALBUTEROL HCL 0.63 MG/3ML IN NEBU
0.6300 mg | INHALATION_SOLUTION | Freq: Once | RESPIRATORY_TRACT | Status: AC
  Administered 2014-11-15: 0.63 mg via RESPIRATORY_TRACT

## 2014-11-15 MED ORDER — LEVOFLOXACIN 500 MG PO TABS: 500.0000 mg | ORAL_TABLET | Freq: Every day | ORAL | Status: DC

## 2014-11-15 MED ORDER — PREDNISONE 10 MG PO TABS: ORAL_TABLET | ORAL | Status: DC

## 2014-11-15 NOTE — Assessment & Plan Note (Signed)
Exacerbation -slow to resolve  Check cxr today  xopenex neb x 1  Depo medrol 120mg  x 1 in office   Plan  Begin Levaquin 500 mg daily for 7 days Mucinex DM twice daily as needed for cough and congestion Prednisone taper over the next week Fluids and rest Follow up with Dr. Annamaria Boots as planned and as needed Chest x-ray today Please contact office for sooner follow up if symptoms do not improve or worsen or seek emergency care

## 2014-11-15 NOTE — Progress Notes (Signed)
Patient ID: Ernest [name], male    DOB: 1956-03-13, 58 y.o.   MRN: [id number]  HPI 07/30/11- 65 yoM smoker followed for COPD/ bronchitis, OSA/ CPAP Last here 04/01/11- note reviewed. He had felt better on a trial of maintenance prednisone. He has felt more short of breath through the hot summer, with no acute event and without blood, chest pain or fever. Easy DOE around home. Has been sharing nebulizer with his wife, who now has a cold and we discussed this. Has been worse in last month, and expecially in recent rainy weather. Noting a little ankle swelling. Smothered trying to take a shower.  Only tolerating his CPAP about 3 nights/ week because he feels smothered, waking sore in anterior chest in the mornings.  Now down to only 2-3 cigarettes/ day and trying to stop.  CXR 08/14/10- Nl NAD PFT 05/26/08- FEV1/FVC 2.44/ 0.76, incr 19% w/ BD, R 0.59 ?DLCO 0.69  09/10/11- 55 yoM smoker followed for COPD/ bronchitis, OSA/ CPAP He reports feeling better "finally doing okay". He is down to just a couple of cigarettes a day. He is using CPAP regularly now and is comfortable with it. Alpha I antitrypsin Report came back normal "M. M." on 08/09/2011. Chest x-ray from 08/15/2011 showed stable changes of COPD. He has had flu vaccine.  11/12/11- 83 yoM smoker followed for COPD/ bronchitis, OSA/ CPAP Here with wife. Has had flu shot. He says he was doing well until 2 weeks ago. He again is coughing thick mucus which is difficult to clear. He depends on his metered inhaler and nebulizer to help clear his airways. Started Mucinex 3 days ago. Mucus is white. He denies fever, sore throat, chest pain, GI upset. Because of orthopnea he is sitting up to sleep for the past 2 nights.  01/14/12- 55 yoM smoker followed for COPD/ bronchitis, OSA/ CPAP Increase congested cough green to yellow sputum but he does not feel sick and denies fever. Sister who was a smoker recently died of lung cancer. Despite that and all of our  discussions, he still smokes a few cigarettes daily. We talked again about motivation to stop completely. Continues CPAP and Provigil one or 2 daily.  03/10/12  56 yoM smoker followed for COPD/ bronchitis, OSA/ CPAP Persistent bronchitic cough. Sputum is not purulent. He is "working on" his smoking habit but blames pollen for keeping him S. coughing now. Remains fully compliant with CPAP, all night every night. Still needs Provigil to help with residual daytime sleepiness as before.  05/12/12- 56 yoM smoker followed for COPD/ bronchitis, OSA/ CPAP Stays SOB all the time-even at rest; wheezing, coughing-productive-yellow in color, chest congestion, nasal congestion,. He is more interested today in smoking cessation which we discussed. He had quit for Daliresp after 10 days because of sustained nausea. He is taking all of his regular medicines and using his rescue inhaler several times a day. Off prednisone now for 1-2 months. CXR 01/23/12-  IMPRESSION:  No acute cardiopulmonary abnormality.  Original Report Authenticated By: Randall An, M.D.   07/13/12- 44 yoM smoker followed for COPD/ bronchitis, OSA/ CPAP, complicated by CAD Pt states increased sob,wheezing,productive cough x3 months. Still smoking with no serious effort to stop. His sister has lung cancer which has gotten his attention and he may be willing to try harder. Some days his breathing is better than others with no real trend. Coughing productive of white sputum with no blood. He has all of his medications currently.  07/30/12- 62 yoM  smoker followed for COPD/ bronchitis, OSA/ CPAP, complicated by CAD Cough-productive-yellow and thick; SOB, wheezing, chest congestion, head congestion x 1 week; O2 sat of 83% RA entered room-20 seconds RA sat of 90% CT 07/21/12-  IMPRESSION:  1. Tiny bilateral lung nodules which are similar to on the prior  exam, given differences in slice selection and measurement error.  Likely subpleural lymph  nodes.  2. Age advanced coronary artery atherosclerosis. Recommend  assessment of coronary risk factors and consideration of medical  therapy.  3. Slight enlargement of splenic lesion since 2007. Favored to  represent a hemangioma or lymphangioma.  Original Report Authenticated By: Areta Haber, M.D.   08/07/12- 35 yoM smoker followed for COPD/ bronchitis, OSA/ CPAP, complicated by CAD, disability for back pain. Cough-productive-white mostly; SOB , wheezing-worse at night     wife here Coughing spasms he can feel his upper airway is closing off. He he still smokes a little even when he feels badly and we went over this again today offering support.  11/13/12- 56 yoM smoker followed for COPD/ bronchitis, OSA/ CPAP, complicated by CAD, disability for back pain. FOLLOWS ZOX:WRUEA still but not productive-more SOB  Went to ER in September or acute exacerbation of COPD but not admitted. Today he is a scheduled visit. He actually says he is doing pretty well with no acute problems. Perennial nasal congestion. He has reduced his cigarette smoking to about 2 per day and is strongly encouraged to stop completely now.  01/25/13-  57 yoM smoker followed for COPD/ bronchitis, OSA/ CPAP, complicated by CAD, disability for back pain. ACUTE VISIT: started smoking agian-discuss taking Chantix; feels raw in chest area with deep cough x 2 weeks. He feels well at this visit, meaning he is at his baseline some daily nonproductive cough and shortness of breath with exertion.  05/17/13-57 yoM smoker followed for COPD/ bronchitis, OSA/ CPAP, complicated by CAD, disability for back pain. FOLLOWS VWU:[id number] is about the same as last visit; has lessened the about the amount he is smoking. Chantix helps. Continues CPAP/ 10/[facility] Discussed smoking cessation effort.  06/10/13- 57 yoM smoker followed for COPD/ bronchitis, OSA/ CPAP, complicated by CAD, disability for back pain. ACUTE VISIT: prod cough with yellow/green  mucus, increased SOB, wheezing, tightness in chest, chills x1 week.  still on augmentin and pred taper from 7/15 phone note > no better Trying Chantix. Wife is also sick. There exposed to his brother's child who has frequent colds. Just started prednisone taper and Augmentin yesterday. Had teeth pulled.  09/16/13- 57 yoM smoker followed for COPD/ bronchitis, OSA/ CPAP, complicated by CAD, disability for back pain. FOLLOWS FOR: was given the flu shot about 1 month and has not felt good since; continues to keep a headache, chest congestion at night. Describes frontal headache, head and chest congestion since a flu shot. Using Chantix, has reduced smoking to 2 or 3 cigarettes daily. Denies infection-no fever, sputum not purulent or bloody. No chest pain. Stays on Mucinex. Not using CPAP because of head congestion. Sister has died of lung cancer/smoker.  2013-10-12- 57 yoM smoker followed for COPD/ bronchitis, OSA/ CPAP, SqCell CA LUL,complicated by CAD, disability for back pain. FOLLOWS FOR: review bx results with patient; continues to have bad frontal lobe headache and lots of congestion. Nausea last night. No vomiting. Abnl CXR > CT chest/ large LUL mass to hilum. We had notified pt and scheduled bx. He is not coughing up much, denies chest pain. New headache mostly left frontal and  retro-orbital x 1 month with no lateralizing neuro. Remains tight on routine meds, off prednisone.  His wife blames "anxiety" for ER trip/ nebulizer the night before his bronchoscopy. Sister recently died of small cell Ca lung.  CT chest 09/17/13- IMPRESSION:  1. 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 This is associated with  some postobstructive changes in the left upper lobe as well as left  hilar and AP window lymphadenopathy. Based on these findings, and  assuming lack of distant metastatic disease (which will have to be  proven by other imaging  studies), this is favored to represent at  least T2b (if not T3 because of satellite nodules in the left upper  lobe), N2, Mx disease (i.e., at least stage IIIA disease). Further  evaluation with PET-CT and brain MRI with and without IV gadolinium  is suggested at this time for complete staging.  2. Mild centrilobular and paraseptal emphysema.  3. Atherosclerosis, including 3 vessel coronary artery disease.  Assessment for potential risk factor modification, dietary therapy  or pharmacologic therapy may be warranted, if clinically indicated.  4. Additional incidental findings, as above.  Electronically Signed  By: Vinnie Langton M.D.  On: 09/22/2013 11:34  Bronch / needle bx 10/04/13- by Dr Lamonte Sakai Pos Squamous Cell Ca  1/23//15- 57 yoM former smoker followed for COPD/ bronchitis, OSA/ CPAP, SqCell CA LUL/ XRT/Chemo, complicated by CAD, disability for back pain. Myocardial Perfusion 11/19/13- EF 65%, Nl wall motion, no ischemic changes. Follows for: frontal lobe headaches have gone away, SOB constantly, some prod cough with white/clear mucous.  Not smoking.  Nearing completion of current rounds of Chemo/ XRT.  Minor sore throat, breathing ok- some SOB, dry cough.  No recent steroids or ABX except Advair.  Continues CPAP 10/ [facility] with no problem.   01/06/13- 57 yoM former smoker followed for COPD/ bronchitis, OSA/ CPAP, SqCell CA LUL/ XRT/Chemo, complicated by CAD, disability for back pain. ACUTE VISIT:  Increase sob, wheezing and cough with congestion head and chest. Also, has bodyaches and sweats Acute illness x1 week started as head congestion moving to his chest with fever, white sputum turning yellow, myalgias. No blood. Had had flu shot. Has completed radiation and chemotherapy pending next oncology followup. Admits smoking 2 cigarettes since last here.  03/02/14- 55 yoM some-time smoker followed for COPD/ bronchitis, OSA/ CPAP, SqCell CA LUL/ XRT/Chemo, complicated by CAD, disability  for back pain. FOLLOWS FOR: Post hospital; Pt states that continues to have congestion, wheezing, and having left sided chest pain this morning. Had reaction to chemo yesterday-started having trouble breathing and hot flashes-was given breathing tx and injection. Hospitalized in with pneumonia, COPD with asthma 2 weeks ago. CT chest 01/28/2014 showed his left upper lobe mass to be cavitating. Yesterday he had reaction to chemotherapy requiring nebulizer treatment and injection. More dyspnea on exertion at baseline, gradually worse over the past month. Coughing clear phlegm. Nebulizer helps temporarily. This morning head left upper anterior lateral chest pain which is gradually fading. He admits he is smoking some-discussed. Continues CPAP 10/[facility] CXR 02/15/14 IMPRESSION:  No acute findings and no change from the study performed earlier  this same day.  Electronically Signed  By: Lajean Manes M.D.  On: 02/15/2014 09:23  04/19/14- 81 yoM some-time smoker followed for COPD/ bronchitis, OSA/ CPAP, SqCell CA LUL/ XRT/Chemo, complicated by CAD, disability for back pain, DM. FOLLOWS FOR: Breathing is terrible. Recently had PNA-Dr Yoo-still on abx  for this. CPAP 10/ O2 2L/ [facility] for sleep Walk test today 3x 180 m on room air- lowest sat was 92%. Scheduled for 3 more days of Levaquin for pneumonia. Improving significantly now. Cough is productive white. CT chest 04/12/14 IMPRESSION:  1. Favor response to therapy of a residual cavitary left upper lobe  lung lesion. Decreased size of the cavitary component with increased  surrounding soft tissue thickening, favored to be treatment related.  Similarly, evolving radiation change within the surrounding left  upper lobe and left apex. The apical presumed radiation change  obscures the previously described left apical pulmonary nodule.  Recommend attention on follow-up.  2. Similar borderline prevascular adenopathy.  3. Resolved left-sided pleural effusion  with trace left pleural  fluid remaining.  4. Minimal left lower lobe nodularity which could be infectious or  inflammatory. Recommend attention on follow-up.  5. Pulmonary artery enlargement suggests pulmonary arterial  hypertension.  6. Incompletely imaged similar cystic/septated splenic lesion. Favor  a benign lesion such as a lymphangioma.  Electronically Signed  By: Abigail Miyamoto M.D.  On: 04/12/2014 13:57  07/19/14- 13 yoM some-time smoker followed for COPD/ bronchitis, OSA/ CPAP, SqCell CA LUL/ XRT/Chemo, complicated by CAD, disability for back pain, DM.    Wife here FOLLOWS FOR: has had choking cough since last here-productive and clear in color. SOB and wheezing. He says cough is not new and not changed. No blood. Occasional localized left parasternal pain lasts for a day or 2 at a time. He is using rescue inhaler too frequently and we discussed use of maintenance controller and nebulizer machine more appropriately. Takes occasional Nuvigil but still needs to nap. Continues Nexium without recognizing much heartburn.  10/19/14- 58 yoM former smoker followed for COPD/ bronchitis, OSA/ CPAP, SqCell CA LUL/ XRT/Chemo, complicated by CAD, disability for back pain, DM.     FOLLOWS FOR: Increased SOB and wheezing, Chest congestion-unable to cough anything up, chills as well x 2 weeks, no fever, blood, or purulent. Sleeps propped up recliner. Neb 4-5x/ day, frequent rescue inhaler. Sleep O2 2l/ [facility] with CPAP 10 are ok used every night. Needs O2 day/ portable Walk test today - 85% on RA at rest, corrected w O2.. CT 07/19/14 IMPRESSION: Decreased size of cavitary component of left upper lobe mass lesion corresponding to the reported history of the lung cancer. The degree of adjacent volume loss and masslike consolidation of the left upper lobe is increased since previously which may reflect treatment effect secondary to radiation fibrosis. This is amenable to followup and continued  surveillance. No new evidence for intrathoracic metastatic disease. Electronically Signed  By: Conchita Paris M.D.  On: 07/19/2014 14:06  11/15/2014 Acute OV  Complains of cough, congestion , wheezing , dyspnea and tightness for last 3 weeks. Was called in prednisone taper last week  with only minimal improvement . Coughing up thick mucus but unable to get anything up .  Was seen by PCP initially tx w/ Doxycycline on 10/26/14  Seen by cardiology last week , Imdur increased for chest tightness.  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 tx w/ chemo and XRT  CT on 10/21/14 showed stable LUL lesion and increased aspdz in LLL likely from radiation changes. New patchy opacity in RLL suspicious for infection/inflammation.  No evidence of lymphadenopathy.  Pt is followed by [name] and has repeat CT chest planned  in 12/2014.    Review of Systems- see HPI Constitutional:   No-   weight loss, night sweats, +fevers, chills,+ fatigue, lassitude. HEENT:  No- headaches, difficulty swallowing, tooth/dental problems, sore throat,       No-  sneezing, itching, ear ache, nasal congestion, post nasal drip,  CV:   orthopnea, PND,  Little swelling in lower extremities,                              No-dizziness, palpitations Resp: +  shortness of breath with exertion or at rest.             No-productive cough,  + non-productive cough,  No-  coughing up of blood.              No-change in color of mucus.  +some wheezing.   Skin: No-   rash or lesions. GI:  No-   heartburn, indigestion, abdominal pain, nausea, vomiting, Psych:  No- change in mood or affect. No depression or anxiety.  No memory loss.    Objective:   Physical Exam General- Alert, Oriented, Affect-appropriate, Distress+chronically ill appearing                           stocky/   overweight,      Skin- rash-none, lesions- none, excoriation- none.      Lymphadenopathy- none Head- atraumatic            Eyes- Gross vision intact, +PERRLA, conjunctivae- injected.             Ears- Hearing, canals- normal            Nose- +turbinate edema, No-  No-perforation             Throat- Mallampati III , mucosa- normal, drainage- none, tonsils- atrophic, Neck- flexible , trachea midline, no stridor , thyroid nl, carotid no bruit Chest - symmetrical excursion , unlabored           Heart/CV- RRR , no murmur , no gallop  , no rub, nl s1 s2                           - JVD- none , edema-none, stasis changes- none, varices- none           Lung- few rhonchi  Abd- soft and obese  Br/ Gen/ Rectal- Not done, not indicated Extrem- cyanosis- none, clubbing, none, atrophy- none, strength- nl. Scar left                        fNeuro- grossly intact to observation

## 2014-11-15 NOTE — Progress Notes (Signed)
Quick Note:  Called spoke with patient, advised of cxr results / recs as stated by TP. Pt verbalized his understanding and denied any questions. ______

## 2014-11-15 NOTE — Telephone Encounter (Signed)
levaquin / pred from ov today not in drug store reordered

## 2014-11-15 NOTE — Addendum Note (Signed)
Addended by: Parke Poisson E on: 11/15/2014 11:11 AM   Modules accepted: Orders

## 2014-11-15 NOTE — Patient Instructions (Signed)
Begin Levaquin 500 mg daily for 7 days Mucinex DM twice daily as needed for cough and congestion Prednisone taper over the next week Fluids and rest Follow up with Dr. Annamaria Boots as planned and as needed Chest x-ray today Please contact office for sooner follow up if symptoms do not improve or worsen or seek emergency care

## 2014-11-28 DIAGNOSIS — M47817 Spondylosis without myelopathy or radiculopathy, lumbosacral region: Secondary | ICD-10-CM | POA: Diagnosis not present

## 2014-11-28 DIAGNOSIS — M961 Postlaminectomy syndrome, not elsewhere classified: Secondary | ICD-10-CM | POA: Diagnosis not present

## 2014-11-28 DIAGNOSIS — M1611 Unilateral primary osteoarthritis, right hip: Secondary | ICD-10-CM | POA: Diagnosis not present

## 2014-11-28 DIAGNOSIS — G894 Chronic pain syndrome: Secondary | ICD-10-CM | POA: Diagnosis not present

## 2014-11-29 ENCOUNTER — Encounter: Payer: Self-pay | Admitting: Gastroenterology

## 2014-12-06 ENCOUNTER — Telehealth: Payer: Self-pay | Admitting: Internal Medicine

## 2014-12-06 NOTE — Telephone Encounter (Signed)
Offer augmentin 875 # 14, 1 twice daily           Prednisone 10 mg, # 20,   2 x 3 days then one daily          Ok to refill cough syrup if needed

## 2014-12-06 NOTE — Telephone Encounter (Signed)
[place] x 1 for Ernest [name]

## 2014-12-06 NOTE — Telephone Encounter (Signed)
Spoke with pt's wife.  She states pt is having prod cough (yellow), chest congestion, sob.  Denies fever or sorethroat.  Using Neb but not sure that he is taking Advair at this time.  Please advise.  Allergies  Allergen Reactions  . Carboplatin Shortness Of Breath, Swelling and Rash    Swelling of lips, rash on face,eyes and head    Current Outpatient Prescriptions on File Prior to Visit  Medication Sig Dispense Refill  . albuterol (PROVENTIL HFA) 108 (90 BASE) MCG/ACT inhaler 2 puffs every 4 hours if needed 2 Inhaler prn  . albuterol (PROVENTIL) (2.5 MG/3ML) 0.083% nebulizer solution INHALE CONTENTS OF 1 VIAL IN NEBULIZER EVERY 4 HOURS AS NEEDED FOR WHEEZING OR SHORTNESS OF BREATH 300 mL 1  . ALPRAZolam (XANAX) 0.25 MG tablet Take 0.25 mg by mouth daily as needed.    . ARIPiprazole (ABILIFY) 5 MG tablet Take 1 tablet (5 mg total) by mouth at bedtime.    . Armodafinil (NUVIGIL) 150 MG tablet Take 150 mg by mouth daily.    [name] atorvastatin (LIPITOR) 20 MG tablet TAKE 1 TABLET (20 MG TOTAL) BY MOUTH DAILY. 90 tablet 3  . clonazePAM (KLONOPIN) 1 MG tablet Take 2 mg by mouth at bedtime as needed. For anxiety/insomnia    . esomeprazole (NEXIUM) 40 MG capsule Take 40 mg by mouth daily.    [name] FLUoxetine (PROZAC) 40 MG capsule Take 40 mg by mouth daily.      . fluticasone (FLONASE) 50 MCG/ACT nasal spray Place 2 sprays into both nostrils daily. 16 g 3  . Fluticasone-Salmeterol (ADVAIR DISKUS) 500-50 MCG/DOSE AEPB 1 puff and rinse mouth, twice daily 60 each prn  . gabapentin (NEURONTIN) 100 MG capsule Take 100 mg by mouth daily.     [name] guaiFENesin-codeine 100-10 MG/5ML syrup Take 10 mLs by mouth every 4 (four) hours as needed for cough.    . Insulin Glargine (LANTUS SOLOSTAR) 100 UNIT/ML Solostar Pen 5 units at bedtime. 5 pen PRN  . Insulin Pen Needle (BD PEN NEEDLE NANO U/F) 32G X 4 MM MISC 1 each by Does not apply route daily. 100 each 3  . isosorbide mononitrate (IMDUR) 60 MG 24 hr tablet Take 1  tablet (60 mg total) by mouth daily. 90 tablet 3  . methocarbamol (ROBAXIN) 500 MG tablet Take 500 mg by mouth 2 (two) times daily as needed for muscle spasms.     . metoprolol succinate (TOPROL-XL) 25 MG 24 hr tablet TAKE 1/2 TABLET BY MOUTH DAILY 30 tablet 5  . mirtazapine (REMERON) 15 MG tablet Take 22.5 mg by mouth at bedtime.    [name] morphine (MS CONTIN) 60 MG 12 hr tablet Take 60 mg by mouth 2 (two) times daily.      . nitroGLYCERIN (NITROSTAT) 0.4 MG SL tablet Place 1 tablet (0.4 mg total) under the tongue every 5 (five) minutes as needed for chest pain. 25 tablet 3  . Omega-3 Fatty Acids (FISH OIL CONCENTRATE) 1000 MG CAPS Take 1,000 mg by mouth daily.     . potassium chloride SA (K-DUR,KLOR-CON) 20 MEQ tablet Take 20 mEq by mouth daily.    . prochlorperazine (COMPAZINE) 10 MG tablet TAKE 1 TABLET BY MOUTH EVERY 6 HOURS AS NEEDED FOR NAUSEA AND VOMITING 60 tablet 3  . VOLTAREN 1 % GEL Apply 2 g topically daily as needed (for pain).      No current facility-administered medications on file prior to visit.

## 2014-12-07 MED ORDER — AMOXICILLIN-POT CLAVULANATE 875-125 MG PO TABS: 1.0000 | ORAL_TABLET | Freq: Two times a day (BID) | ORAL | Status: DC

## 2014-12-07 MED ORDER — PREDNISONE 10 MG PO TABS: ORAL_TABLET | ORAL | Status: DC

## 2014-12-07 NOTE — Telephone Encounter (Signed)
Called and spoke to pt's wife, Rodena Piety. Informed Rodena Piety of the recs per CY. Rodena Piety stated the pt has enough cough syrup from last time. Rx sent to preferred pharmacy. Rodena Piety verbalized understanding and denied any further questions or concerns at this time.

## 2014-12-11 ENCOUNTER — Other Ambulatory Visit: Payer: Self-pay | Admitting: Internal Medicine

## 2014-12-15 ENCOUNTER — Ambulatory Visit (INDEPENDENT_AMBULATORY_CARE_PROVIDER_SITE_OTHER)
Admission: RE | Admit: 2014-12-15 | Discharge: 2014-12-15 | Disposition: A | Discharge status: Home / Self Care | Payer: Commercial Managed Care - HMO | Source: Ambulatory Visit | Attending: Internal Medicine | Admitting: Internal Medicine

## 2014-12-15 ENCOUNTER — Ambulatory Visit (INDEPENDENT_AMBULATORY_CARE_PROVIDER_SITE_OTHER): Payer: Commercial Managed Care - HMO | Admitting: Internal Medicine

## 2014-12-15 ENCOUNTER — Encounter: Payer: Self-pay | Admitting: Internal Medicine

## 2014-12-15 VITALS — BP 130/84 | HR 74 | Temp 99.2°F | Ht 66.0 in | Wt 198.8 lb

## 2014-12-15 DIAGNOSIS — Z85118 Personal history of other malignant neoplasm of bronchus and lung: Secondary | ICD-10-CM | POA: Diagnosis not present

## 2014-12-15 DIAGNOSIS — R509 Fever, unspecified: Secondary | ICD-10-CM | POA: Diagnosis not present

## 2014-12-15 DIAGNOSIS — R0989 Other specified symptoms and signs involving the circulatory and respiratory systems: Secondary | ICD-10-CM | POA: Diagnosis not present

## 2014-12-15 DIAGNOSIS — R05 Cough: Secondary | ICD-10-CM | POA: Diagnosis not present

## 2014-12-15 DIAGNOSIS — J449 Chronic obstructive pulmonary disease, unspecified: Secondary | ICD-10-CM | POA: Diagnosis not present

## 2014-12-15 DIAGNOSIS — J9611 Chronic respiratory failure with hypoxia: Secondary | ICD-10-CM

## 2014-12-15 MED ORDER — FORMOTEROL FUMARATE 20 MCG/2ML IN NEBU: 20.0000 ug | INHALATION_SOLUTION | Freq: Two times a day (BID) | RESPIRATORY_TRACT | Status: DC

## 2014-12-15 MED ORDER — ACETAMINOPHEN-CODEINE #3 300-30 MG PO TABS: ORAL_TABLET | ORAL | Status: DC

## 2014-12-15 MED ORDER — BUDESONIDE 0.25 MG/2ML IN SUSP: RESPIRATORY_TRACT | Status: DC

## 2014-12-15 NOTE — Assessment & Plan Note (Signed)
12/15/2014  RA at rest 92 12/15/2014  Walked 3lpm  x 2 laps @ 185 ft each stopped due to sob/ sat 89%/ slow to mod pace   rec as of 12/15/2014 use 02 prn at rest and 3lpm walking/sleeping

## 2014-12-15 NOTE — Patient Instructions (Addendum)
Change 02 to where you don't use it at rest unless you feel it helps but always use 3lpm with activity and at bedtime for now   Change Nexium  40 mg   Take 30-60 min before first meal of the day and  Add Pepcid ac 20 mg one bedtime until return to office   Stop advair and replace with perforomist/budesonide twice daily per neb per [place] B = backup Only use your albuterol as a rescue medication to be used if you can't catch your breath by resting or doing a relaxed purse lip breathing pattern.  - The less you use it, the better it will work when you need it. - Ok to use up to every 4 hours if you must but call for immediate appointment if use goes up over your usual need - Don't leave home without it !!  (think of it like the spare tire for your car)   GERD (REFLUX)  is an extremely common cause of respiratory symptoms just like yours , many times with no obvious heartburn at all.    It can be treated with medication, but also with lifestyle changes including avoidance of late meals, excessive alcohol, smoking cessation, and avoid fatty foods, chocolate, peppermint, colas, red wine, and acidic juices such as orange juice.  NO MINT OR MENTHOL PRODUCTS SO NO COUGH DROPS  USE SUGARLESS CANDY INSTEAD (Jolley ranchers or Stover's or Life Savers) or even ice chips will also do - the key is to swallow to prevent all throat clearing. NO OIL BASED VITAMINS - use powdered substitutes.  Please remember to go to the x-ray department downstairs for your tests - we will call you with the results when they are available.  See Tammy NP w/in 2 weeks with all your medications, even over the counter meds, separated in two separate bags, the ones you take no matter what vs the ones you stop once you feel better and take only as needed when you feel you need them.   Tammy  will generate for you a new user friendly medication calendar that will put [place] all on the same page re: your medication use.     Without  this process, it simply isn't possible to assure that we are providing  your outpatient care  with  the attention to detail we feel you deserve.   If we cannot assure that you're getting that kind of care,  then we cannot manage your problem effectively from this clinic.  Once you have seen Tammy and we are sure that we're all on the same page with your medication use she will arrange follow up with me or Dr Annamaria Boots.

## 2014-12-15 NOTE — Progress Notes (Signed)
Patient ID: Ernest [name], male    DOB: October 20, 1956, 59 y.o.   MRN: [id number]  HPI 07/30/11- 60 yoM smoker followed for COPD/ bronchitis, OSA/ CPAP Last here 04/01/11- note reviewed. He had felt better on a trial of maintenance prednisone. He has felt more short of breath through the hot summer, with no acute event and without blood, chest pain or fever. Easy DOE around home. Has been sharing nebulizer with his wife, who now has a cold and we discussed this. Has been worse in last month, and expecially in recent rainy weather. Noting a little ankle swelling. Smothered trying to take a shower.  Only tolerating his CPAP about 3 nights/ week because he feels smothered, waking sore in anterior chest in the mornings.  Now down to only 2-3 cigarettes/ day and trying to stop.  CXR 08/14/10- Nl NAD PFT 05/26/08- FEV1/FVC 2.44/ 0.76, incr 19% w/ BD, R 0.59 ?DLCO 0.69  09/10/11- 55 yoM smoker followed for COPD/ bronchitis, OSA/ CPAP He reports feeling better "finally doing okay". He is down to just a couple of cigarettes a day. He is using CPAP regularly now and is comfortable with it. Alpha I antitrypsin Report came back normal "M. M." on 08/09/2011. Chest x-ray from 08/15/2011 showed stable changes of COPD. He has had flu vaccine.  11/12/11- 79 yoM smoker followed for COPD/ bronchitis, OSA/ CPAP Here with wife. Has had flu shot. He says he was doing well until 2 weeks ago. He again is coughing thick mucus which is difficult to clear. He depends on his metered inhaler and nebulizer to help clear his airways. Started Mucinex 3 days ago. Mucus is white. He denies fever, sore throat, chest pain, GI upset. Because of orthopnea he is sitting up to sleep for the past 2 nights.  01/14/12- 55 yoM smoker followed for COPD/ bronchitis, OSA/ CPAP Increase congested cough green to yellow sputum but he does not feel sick and denies fever. Sister who was a smoker recently died of lung cancer. Despite that and all of our  discussions, he still smokes a few cigarettes daily. We talked again about motivation to stop completely. Continues CPAP and Provigil one or 2 daily.  03/10/12  56 yoM smoker followed for COPD/ bronchitis, OSA/ CPAP Persistent bronchitic cough. Sputum is not purulent. He is "working on" his smoking habit but blames pollen for keeping him S. coughing now. Remains fully compliant with CPAP, all night every night. Still needs Provigil to help with residual daytime sleepiness as before.  05/12/12- 56 yoM smoker followed for COPD/ bronchitis, OSA/ CPAP Stays SOB all the time-even at rest; wheezing, coughing-productive-yellow in color, chest congestion, nasal congestion,. He is more interested today in smoking cessation which we discussed. He had quit for Daliresp after 10 days because of sustained nausea. He is taking all of his regular medicines and using his rescue inhaler several times a day. Off prednisone now for 1-2 months. CXR 01/23/12-  IMPRESSION:  No acute cardiopulmonary abnormality.  Original Report Authenticated By: Randall An, M.D.   07/13/12- 43 yoM smoker followed for COPD/ bronchitis, OSA/ CPAP, complicated by CAD Pt states increased sob,wheezing,productive cough x3 months. Still smoking with no serious effort to stop. His sister has lung cancer which has gotten his attention and he may be willing to try harder. Some days his breathing is better than others with no real trend. Coughing productive of white sputum with no blood. He has all of his medications currently.  07/30/12- 32 yoM  smoker followed for COPD/ bronchitis, OSA/ CPAP, complicated by CAD Cough-productive-yellow and thick; SOB, wheezing, chest congestion, head congestion x 1 week; O2 sat of 83% RA entered room-20 seconds RA sat of 90% CT 07/21/12-  IMPRESSION:  1. Tiny bilateral lung nodules which are similar to on the prior  exam, given differences in slice selection and measurement error.  Likely subpleural lymph  nodes.  2. Age advanced coronary artery atherosclerosis. Recommend  assessment of coronary risk factors and consideration of medical  therapy.  3. Slight enlargement of splenic lesion since 2007. Favored to  represent a hemangioma or lymphangioma.  Original Report Authenticated By: Areta Haber, M.D.   08/07/12- 38 yoM smoker followed for COPD/ bronchitis, OSA/ CPAP, complicated by CAD, disability for back pain. Cough-productive-white mostly; SOB , wheezing-worse at night     wife here Coughing spasms he can feel his upper airway is closing off. He he still smokes a little even when he feels badly and we went over this again today offering support.  11/13/12- 56 yoM smoker followed for COPD/ bronchitis, OSA/ CPAP, complicated by CAD, disability for back pain. FOLLOWS PXT:GGYIR still but not productive-more SOB  Went to ER in September or acute exacerbation of COPD but not admitted. Today he is a scheduled visit. He actually says he is doing pretty well with no acute problems. Perennial nasal congestion. He has reduced his cigarette smoking to about 2 per day and is strongly encouraged to stop completely now.  01/25/13-  57 yoM smoker followed for COPD/ bronchitis, OSA/ CPAP, complicated by CAD, disability for back pain. ACUTE VISIT: started smoking agian-discuss taking Chantix; feels raw in chest area with deep cough x 2 weeks. He feels well at this visit, meaning he is at his baseline some daily nonproductive cough and shortness of breath with exertion.  05/17/13-57 yoM smoker followed for COPD/ bronchitis, OSA/ CPAP, complicated by CAD, disability for back pain. FOLLOWS SWN:IOEVOJJKK is about the same as last visit; has lessened the about the amount he is smoking. Chantix helps. Continues CPAP/ 10/[facility] Discussed smoking cessation effort.  06/10/13- 57 yoM smoker followed for COPD/ bronchitis, OSA/ CPAP, complicated by CAD, disability for back pain. ACUTE VISIT: prod cough with yellow/green  mucus, increased SOB, wheezing, tightness in chest, chills x1 week.  still on augmentin and pred taper from 7/15 phone note > no better Trying Chantix. Wife is also sick. There exposed to his brother's child who has frequent colds. Just started prednisone taper and Augmentin yesterday. Had teeth pulled.  09/16/13- 57 yoM smoker followed for COPD/ bronchitis, OSA/ CPAP, complicated by CAD, disability for back pain. FOLLOWS FOR: was given the flu shot about 1 month and has not felt good since; continues to keep a headache, chest congestion at night. Describes frontal headache, head and chest congestion since a flu shot. Using Chantix, has reduced smoking to 2 or 3 cigarettes daily. Denies infection-no fever, sputum not purulent or bloody. No chest pain. Stays on Mucinex. Not using CPAP because of head congestion. Sister has died of lung cancer/smoker.  10-20-2013- 57 yoM smoker followed for COPD/ bronchitis, OSA/ CPAP, SqCell CA LUL,complicated by CAD, disability for back pain. FOLLOWS FOR: review bx results with patient; continues to have bad frontal lobe headache and lots of congestion. Nausea last night. No vomiting. Abnl CXR > CT chest/ large LUL mass to hilum. We had notified pt and scheduled bx. He is not coughing up much, denies chest pain. New headache mostly left frontal and  retro-orbital x 1 month with no lateralizing neuro. Remains tight on routine meds, off prednisone.  His wife blames "anxiety" for ER trip/ nebulizer the night before his bronchoscopy. Sister recently died of small cell Ca lung.  CT chest 09/17/13- IMPRESSION:  1. 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 This is associated with  some postobstructive changes in the left upper lobe as well as left  hilar and AP window lymphadenopathy. Based on these findings, and  assuming lack of distant metastatic disease (which will have to be  proven by other imaging  studies), this is favored to represent at  least T2b (if not T3 because of satellite nodules in the left upper  lobe), N2, Mx disease (i.e., at least stage IIIA disease). Further  evaluation with PET-CT and brain MRI with and without IV gadolinium  is suggested at this time for complete staging.  2. Mild centrilobular and paraseptal emphysema.  3. Atherosclerosis, including 3 vessel coronary artery disease.  Assessment for potential risk factor modification, dietary therapy  or pharmacologic therapy may be warranted, if clinically indicated.  4. Additional incidental findings, as above.  Electronically Signed  By: Vinnie Langton M.D.  On: 09/22/2013 11:34  Bronch / needle bx 10/04/13- by Dr Lamonte Sakai Pos Squamous Cell Ca  1/23//15- 57 yoM former smoker followed for COPD/ bronchitis, OSA/ CPAP, SqCell CA LUL/ XRT/Chemo, complicated by CAD, disability for back pain. Myocardial Perfusion 11/19/13- EF 65%, Nl wall motion, no ischemic changes. Follows for: frontal lobe headaches have gone away, SOB constantly, some prod cough with white/clear mucous.  Not smoking.  Nearing completion of current rounds of Chemo/ XRT.  Minor sore throat, breathing ok- some SOB, dry cough.  No recent steroids or ABX except Advair.  Continues CPAP 10/ [facility] with no problem.   01/06/13- 57 yoM former smoker followed for COPD/ bronchitis, OSA/ CPAP, SqCell CA LUL/ XRT/Chemo, complicated by CAD, disability for back pain. ACUTE VISIT:  Increase sob, wheezing and cough with congestion head and chest. Also, has bodyaches and sweats Acute illness x1 week started as head congestion moving to his chest with fever, white sputum turning yellow, myalgias. No blood. Had had flu shot. Has completed radiation and chemotherapy pending next oncology followup. Admits smoking 2 cigarettes since last here.  03/02/14- 89 yoM some-time smoker followed for COPD/ bronchitis, OSA/ CPAP, SqCell CA LUL/ XRT/Chemo, complicated by CAD, disability  for back pain. FOLLOWS FOR: Post hospital; Pt states that continues to have congestion, wheezing, and having left sided chest pain this morning. Had reaction to chemo yesterday-started having trouble breathing and hot flashes-was given breathing tx and injection. Hospitalized in with pneumonia, COPD with asthma 2 weeks ago. CT chest 01/28/2014 showed his left upper lobe mass to be cavitating. Yesterday he had reaction to chemotherapy requiring nebulizer treatment and injection. More dyspnea on exertion at baseline, gradually worse over the past month. Coughing clear phlegm. Nebulizer helps temporarily. This morning head left upper anterior lateral chest pain which is gradually fading. He admits he is smoking some-discussed. Continues CPAP 10/[facility] CXR 02/15/14 IMPRESSION:  No acute findings and no change from the study performed earlier  this same day.  Electronically Signed  By: Lajean Manes M.D.  On: 02/15/2014 09:23  04/19/14- 29 yoM some-time smoker followed for COPD/ bronchitis, OSA/ CPAP, SqCell CA LUL/ XRT/Chemo, complicated by CAD, disability for back pain, DM. FOLLOWS FOR: Breathing is terrible. Recently had PNA-Dr Yoo-still on abx  for this. CPAP 10/ O2 2L/ [facility] for sleep Walk test today 3x 180 m on room air- lowest sat was 92%. Scheduled for 3 more days of Levaquin for pneumonia. Improving significantly now. Cough is productive white. CT chest 04/12/14 IMPRESSION:  1. Favor response to therapy of a residual cavitary left upper lobe  lung lesion. Decreased size of the cavitary component with increased  surrounding soft tissue thickening, favored to be treatment related.  Similarly, evolving radiation change within the surrounding left  upper lobe and left apex. The apical presumed radiation change  obscures the previously described left apical pulmonary nodule.  Recommend attention on follow-up.  2. Similar borderline prevascular adenopathy.  3. Resolved left-sided pleural effusion  with trace left pleural  fluid remaining.  4. Minimal left lower lobe nodularity which could be infectious or  inflammatory. Recommend attention on follow-up.  5. Pulmonary artery enlargement suggests pulmonary arterial  hypertension.  6. Incompletely imaged similar cystic/septated splenic lesion. Favor  a benign lesion such as a lymphangioma.  Electronically Signed  By: Abigail Miyamoto M.D.  On: 04/12/2014 13:57  07/19/14- 84 yoM some-time smoker followed for COPD/ bronchitis, OSA/ CPAP, SqCell CA LUL/ XRT/Chemo, complicated by CAD, disability for back pain, DM.    Wife here FOLLOWS FOR: has had choking cough since last here-productive and clear in color. SOB and wheezing. He says cough is not new and not changed. No blood. Occasional localized left parasternal pain lasts for a day or 2 at a time. He is using rescue inhaler too frequently and we discussed use of maintenance controller and nebulizer machine more appropriately. Takes occasional Nuvigil but still needs to nap. Continues Nexium without recognizing much heartburn.  10/19/14- 58 yoM former smoker followed for COPD/ bronchitis, OSA/ CPAP, SqCell CA LUL/ XRT/Chemo, complicated by CAD, disability for back pain, DM.     FOLLOWS FOR: Increased SOB and wheezing, Chest congestion-unable to cough anything up, chills as well x 2 weeks, no fever, blood, or purulent. Sleeps propped up recliner. Neb 4-5x/ day, frequent rescue inhaler. Sleep O2 2l/ [facility] with CPAP 10 are ok used every night. Needs O2 day/ portable Walk test today - 85% on RA at rest, corrected w O2.. CT 07/19/14 IMPRESSION: Decreased size of cavitary component of left upper lobe mass lesion corresponding to the reported history of the lung cancer. The degree of adjacent volume loss and masslike consolidation of the left upper lobe is increased since previously which may reflect treatment effect secondary to radiation fibrosis. This is amenable to followup and continued  surveillance. No new evidence for intrathoracic metastatic disease. Electronically Signed  By: Conchita Paris M.D.  On: 07/19/2014 14:06  11/15/2014 Acute OV  Complains of cough, congestion , wheezing , dyspnea and tightness for last 3 weeks. Was called in prednisone taper last week  with only minimal improvement . Coughing up thick mucus but unable to get anything up .  Was seen by PCP initially tx w/ Doxycycline on 10/26/14  Seen by cardiology last week , Imdur increased for chest tightness.  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 tx w/ chemo and XRT  CT on 10/21/14 showed stable LUL lesion and increased aspdz in LLL likely from radiation changes. New patchy opacity in RLL suspicious for infection/inflammation.  No evidence of lymphadenopathy.  Pt is followed by [name] and has repeat CT chest planned  in 12/2014.  rec Begin Levaquin 500 mg daily for 7 days Mucinex DM twice daily as needed for cough and congestion Prednisone taper over the next week Fluids and rest    12/15/2014  Acute  ov/Wert re: refractory cough/ wheeze across  the room  No better p pred/levaquin  Chief Complaint  Patient presents with  . Acute Visit    Pt c/o cough and increased SOB for the past month. He has prod cough with minimal white sputum. He states that he is SOB with any exertion- using albuterol inhaler 3-4 x per day and neb with albuterol 5 x per day.   sob at rest is better  when on 3lpm  Walking on up 3lpm  Sleeping about 30 degrees in recliner 3lpm but using neb 3 x nightly and only a little better transiently, no resp to rob with codie, no purulent sputum or hemoptysis gen ant cp with coughing only   No obvious day to day or daytime variabilty or assoc chest tightness,  overt sinus or hb symptoms. No unusual exp hx or h/o childhood pna/  asthma or knowledge of premature birth.   Also denies any obvious fluctuation of symptoms with weather or environmental changes or other aggravating or alleviating factors except as outlined above   Current Medications, Allergies, Complete Past Medical History, Past Surgical History, Family History, and Social History were reviewed in [employer] record.  ROS  The following are not active complaints unless bolded sore throat, dysphagia, dental problems, itching, sneezing,  nasal congestion or excess/ purulent secretions, ear ache,   fever, chills, sweats, unintended wt loss, pleuritic or exertional cp, hemoptysis,  orthopnea pnd or leg swelling, presyncope, palpitations, heartburn, abdominal pain, anorexia, nausea, vomiting, diarrhea  or change in bowel or urinary habits, change in stools or urine, dysuria,hematuria,  rash, arthralgias, visual complaints, headache, numbness weakness or ataxia or problems with walking or coordination,  change in mood/affect or memory.          Objective:   Physical Exam:   Wt Readings from Last 3 Encounters:  12/15/14 198 lb 12.8 oz (90.175 kg)  11/15/14 195 lb 9.6 oz (88.724 kg)  11/11/14 191 lb 12.8 oz (87 kg)    Vital signs reviewed   HEENT: nl dentition, turbinates, and orophanx. Nl external ear canals without cough reflex   NECK :  without JVD/Nodes/TM/ nl carotid upstrokes bilaterally   LUNGS: no acc muscle use, clear to A and P bilaterally without cough on insp or exp maneuvers   CV:  RRR  no s3 or murmur or increase in P2, no edema   ABD:  soft and nontender with nl excursion in the supine position. No bruits or organomegaly, bowel sounds nl  MS:  warm without deformities, calf tenderness, cyanosis or clubbing  SKIN: warm and dry without lesions    NEURO:  alert, approp, no deficits   CXR PA and Lateral:   12/15/2014 :     I personally reviewed images and agree with radiology impression as follows:    Stable  chronic consolidation and postradiation changes in left upper  lobe. No definite superimposed infiltrate or pulmonary edema.

## 2014-12-15 NOTE — Assessment & Plan Note (Signed)
PFT 05/26/08- FEV1 2.44/0.76 incr 19% w/ BD;  FEV1/FVC 0.59; TLC 106%; DLCO 0.69  CXR 08/14/2010 normal no active disease. Postoperative changes cervical spine surgery. CXR 08/15/11- stable with no active process. Office spirometry 09/27/13- FEV1  1.52/ 44%, severe obstructive airways disease - 12/15/2014 d/c'd advair due to cough and trial of bud/foromoterol bid    Severe refractory symptoms   are markedly disproportionate to objective findings and not clear this is all a  lung problem but pt does appear to have difficult airway management issues. DDX of  difficult airways management all start with A and  include Adherence, Ace Inhibitors, Acid Reflux, Active Sinus Disease, Alpha 1 Antitripsin deficiency, Anxiety masquerading as Airways dz,  ABPA,  allergy(esp in young), Aspiration (esp in elderly), Adverse effects of DPI,  Active smokers, plus two Bs  = Bronchiectasis and Beta blocker use..and one C= CHF  Adherence is always the initial "prime suspect" and is a multilayered concern that requires a "trust but verify" approach in every patient - starting with knowing how to use medications, especially inhalers, correctly, keeping up with refills and understanding the fundamental difference between maintenance and prns vs those medications only taken for a very short course and then stopped and not refilled.   ? Adverse effect of advair > try off and rx with neb laba/bud  ? Acid (or non-acid) GERD > always difficult to exclude as up to 75% of pts in some series report no assoc GI/ Heartburn symptoms> rec max (24h)  acid suppression and diet restrictions/ reviewed and instructions given in writing.   ? Anxiety/ cyclical cough > tylenol #3 one q 4h prn

## 2014-12-18 ENCOUNTER — Other Ambulatory Visit: Payer: Self-pay | Admitting: Internal Medicine

## 2014-12-18 DIAGNOSIS — J449 Chronic obstructive pulmonary disease, unspecified: Secondary | ICD-10-CM | POA: Diagnosis not present

## 2014-12-19 ENCOUNTER — Telehealth: Payer: Self-pay | Admitting: Internal Medicine

## 2014-12-19 NOTE — Telephone Encounter (Signed)
Send 10 mg, # 31, 1 daily, ref x 3

## 2014-12-19 NOTE — Telephone Encounter (Signed)
Result Notes     Notes Recorded by Rosana Berger, CMA on 12/19/2014 at 12:07 PM [facility] ------  Notes Recorded by Tanda Rockers, MD on 12/15/2014 at 5:34 PM Call pt: Reviewed cxr and no acute change so no change in recommendations made at ov   Pt aware of results and no further questions or concerns.

## 2014-12-19 NOTE — Telephone Encounter (Signed)
CY are you okay with this refill or want me to send full 30 day supply or refill this way? Thanks.

## 2014-12-19 NOTE — Progress Notes (Signed)
Quick Note:  LMTCB ______ 

## 2014-12-22 ENCOUNTER — Telehealth: Payer: Self-pay | Admitting: Internal Medicine

## 2014-12-22 MED ORDER — PREDNISONE 10 MG PO TABS: 10.0000 mg | ORAL_TABLET | Freq: Every day | ORAL | Status: DC

## 2014-12-22 MED ORDER — FLUTICASONE-SALMETEROL 250-50 MCG/DOSE IN AEPB: 1.0000 | INHALATION_SPRAY | Freq: Two times a day (BID) | RESPIRATORY_TRACT | Status: DC

## 2014-12-22 NOTE — Telephone Encounter (Signed)
Spoke with pt's wife.  She reports that pt seen Dr Melvyn Novas for sick visit on 12/15/14 and Advair was stopped and changed to Performist / Advair through neb.  [facility] told them this would cost $200/ mth copay and they cant afford this.Pt still having trouble with SOB.  Took last pill of Prednisone 10 mg yesterday.  Please advise.  Allergies  Allergen Reactions  . Carboplatin Shortness Of Breath, Swelling and Rash    Swelling of lips, rash on face,eyes and head    Current Outpatient Prescriptions on File Prior to Visit  Medication Sig Dispense Refill  . acetaminophen-codeine (TYLENOL #3) 300-30 MG per tablet One every 4 hours as needed for cough 40 tablet 0  . albuterol (PROVENTIL) (2.5 MG/3ML) 0.083% nebulizer solution INHALE 1 VIAL IN NEBULIZER EVERY 4 HOURS AS NEEDED FOR WHEEZING OR SHORTNESS OF BREATH 300 mL 1  . ALPRAZolam (XANAX) 0.25 MG tablet Take 0.25 mg by mouth daily as needed.    . ARIPiprazole (ABILIFY) 5 MG tablet Take 1 tablet (5 mg total) by mouth at bedtime.    . Armodafinil (NUVIGIL) 150 MG tablet Take 150 mg by mouth daily.    [name] atorvastatin (LIPITOR) 20 MG tablet TAKE 1 TABLET (20 MG TOTAL) BY MOUTH DAILY. 90 tablet 3  . budesonide (PULMICORT) 0.25 MG/2ML nebulizer solution One every 12 hours 120 mL 12  . clonazePAM (KLONOPIN) 1 MG tablet Take 2 mg by mouth at bedtime as needed. For anxiety/insomnia    . esomeprazole (NEXIUM) 40 MG capsule Take 40 mg by mouth daily.    [name] FLUoxetine (PROZAC) 40 MG capsule Take 40 mg by mouth daily.      . fluticasone (FLONASE) 50 MCG/ACT nasal spray Place 2 sprays into both nostrils daily. 16 g 3  . formoterol (PERFOROMIST) 20 MCG/2ML nebulizer solution Take 2 mLs (20 mcg total) by nebulization 2 (two) times daily. Use in nebulizer twice daily perfectly regularly 120 mL 11  . gabapentin (NEURONTIN) 100 MG capsule Take 100 mg by mouth daily.     . Insulin Glargine (LANTUS SOLOSTAR) 100 UNIT/ML Solostar Pen 5 units at bedtime. 5 pen PRN  . Insulin  Pen Needle (BD PEN NEEDLE NANO U/F) 32G X 4 MM MISC 1 each by Does not apply route daily. 100 each 3  . isosorbide mononitrate (IMDUR) 60 MG 24 hr tablet Take 1 tablet (60 mg total) by mouth daily. 90 tablet 3  . methocarbamol (ROBAXIN) 500 MG tablet Take 500 mg by mouth 2 (two) times daily as needed for muscle spasms.     . metoprolol succinate (TOPROL-XL) 25 MG 24 hr tablet TAKE 1/2 TABLET BY MOUTH DAILY 30 tablet 5  . mirtazapine (REMERON) 15 MG tablet Take 22.5 mg by mouth at bedtime.    [name] morphine (MS CONTIN) 30 MG 12 hr tablet Take 30 mg by mouth 3 (three) times daily.    . nitroGLYCERIN (NITROSTAT) 0.4 MG SL tablet Place 1 tablet (0.4 mg total) under the tongue every 5 (five) minutes as needed for chest pain. 25 tablet 3  . potassium chloride SA (K-DUR,KLOR-CON) 20 MEQ tablet Take 20 mEq by mouth daily.    . predniSONE (DELTASONE) 10 MG tablet Take 1 tablet (10 mg total) by mouth daily with breakfast. 31 tablet 3  . prochlorperazine (COMPAZINE) 10 MG tablet TAKE 1 TABLET BY MOUTH EVERY 6 HOURS AS NEEDED FOR NAUSEA AND VOMITING 60 tablet 3  . VOLTAREN 1 % GEL Apply 2 g topically daily as  needed (for pain).      No current facility-administered medications on file prior to visit.

## 2014-12-22 NOTE — Telephone Encounter (Signed)
Pt wife returning call.Stanley A Dalton' °

## 2014-12-22 NOTE — Telephone Encounter (Signed)
Spouse called back and pt would like to go back on advair. Also prednisone did help so RX was called in. Please advise which dosage of of advair to call in. Pt has both advair 250-50 and advair 500-50 on past med list. thanks

## 2014-12-22 NOTE — Telephone Encounter (Signed)
Spoke with pt's wife.  She states that she will talk to pt about Advair and Prednisone and call us back.  Will await call.

## 2014-12-22 NOTE — Telephone Encounter (Signed)
RX sent in. Nothing further needed

## 2014-12-22 NOTE — Telephone Encounter (Signed)
lmtcb x1 

## 2014-12-22 NOTE — Telephone Encounter (Signed)
Pt returning call.Ernest [name] ° °

## 2014-12-22 NOTE — Telephone Encounter (Signed)
Advair 250  # 1, 1 puff then rinse mouth, twice daily    Refill prn

## 2014-12-22 NOTE — Telephone Encounter (Signed)
We may not be able to stop shortness of breath. Would he like to go back to Advair? Did he think the prednisone helped him?   If so, offer prednisone 10 mg, # 31, 1 daily , ref x 3

## 2014-12-24 DIAGNOSIS — J9601 Acute respiratory failure with hypoxia: Secondary | ICD-10-CM | POA: Diagnosis not present

## 2014-12-24 DIAGNOSIS — J449 Chronic obstructive pulmonary disease, unspecified: Secondary | ICD-10-CM | POA: Diagnosis not present

## 2014-12-24 DIAGNOSIS — C342 Malignant neoplasm of middle lobe, bronchus or lung: Secondary | ICD-10-CM | POA: Diagnosis not present

## 2014-12-28 ENCOUNTER — Ambulatory Visit: Payer: Commercial Managed Care - HMO | Admitting: Internal Medicine

## 2014-12-28 ENCOUNTER — Other Ambulatory Visit: Payer: Self-pay | Admitting: Internal Medicine

## 2014-12-29 ENCOUNTER — Ambulatory Visit (INDEPENDENT_AMBULATORY_CARE_PROVIDER_SITE_OTHER): Payer: Commercial Managed Care - HMO | Admitting: Adult Health

## 2014-12-29 ENCOUNTER — Encounter: Payer: Self-pay | Admitting: Adult Health

## 2014-12-29 VITALS — BP 136/68 | HR 67 | Temp 98.0°F | Ht 66.0 in | Wt 196.8 lb

## 2014-12-29 DIAGNOSIS — J9611 Chronic respiratory failure with hypoxia: Secondary | ICD-10-CM

## 2014-12-29 DIAGNOSIS — J441 Chronic obstructive pulmonary disease with (acute) exacerbation: Secondary | ICD-10-CM

## 2014-12-29 MED ORDER — MIRTAZAPINE 45 MG PO TABS
45.0000 mg | ORAL_TABLET | Freq: Every day | ORAL | Status: DC
Start: 2014-12-29 — End: 2016-12-06

## 2014-12-29 MED ORDER — PREDNISONE 10 MG PO TABS: ORAL_TABLET | ORAL | Status: DC

## 2014-12-29 NOTE — Assessment & Plan Note (Signed)
Advised to wear oxygen with activity and at bedtime Portable concentrator order sent to DME company-this is second order sent

## 2014-12-29 NOTE — Progress Notes (Signed)
Patient ID: Ernest [name], male    DOB: 1956/03/08, 59 y.o.   MRN: [id number]  HPI 07/30/11- 59 yoM smoker followed for COPD/ bronchitis, OSA/ CPAP Last here 04/01/11- note reviewed. He had felt better on a trial of maintenance prednisone. He has felt more short of breath through the hot summer, with no acute event and without blood, chest pain or fever. Easy DOE around home. Has been sharing nebulizer with his wife, who now has a cold and we discussed this. Has been worse in last month, and expecially in recent rainy weather. Noting a little ankle swelling. Smothered trying to take a shower.  Only tolerating his CPAP about 3 nights/ week because he feels smothered, waking sore in anterior chest in the mornings.  Now down to only 2-3 cigarettes/ day and trying to stop.  CXR 08/14/10- Nl NAD PFT 05/26/08- FEV1/FVC 2.44/ 0.76, incr 19% w/ BD, R 0.59 ?DLCO 0.69  09/10/11- 59 yoM smoker followed for COPD/ bronchitis, OSA/ CPAP He reports feeling better "finally doing okay". He is down to just a couple of cigarettes a day. He is using CPAP regularly now and is comfortable with it. Alpha I antitrypsin Report came back normal "M. M." on 08/09/2011. Chest x-ray from 08/15/2011 showed stable changes of COPD. He has had flu vaccine.  11/12/11- 59 yoM smoker followed for COPD/ bronchitis, OSA/ CPAP Here with wife. Has had flu shot. He says he was doing well until 2 weeks ago. He again is coughing thick mucus which is difficult to clear. He depends on his metered inhaler and nebulizer to help clear his airways. Started Mucinex 3 days ago. Mucus is white. He denies fever, sore throat, chest pain, GI upset. Because of orthopnea he is sitting up to sleep for the past 2 nights.  01/14/12- 59 yoM smoker followed for COPD/ bronchitis, OSA/ CPAP Increase congested cough green to yellow sputum but he does not feel sick and denies fever. Sister who was a smoker recently died of lung cancer. Despite that and all of our  discussions, he still smokes a few cigarettes daily. We talked again about motivation to stop completely. Continues CPAP and Provigil one or 2 daily.  03/10/12  59 yoM smoker followed for COPD/ bronchitis, OSA/ CPAP Persistent bronchitic cough. Sputum is not purulent. He is "working on" his smoking habit but blames pollen for keeping him S. coughing now. Remains fully compliant with CPAP, all night every night. Still needs Provigil to help with residual daytime sleepiness as before.  05/12/12- 59 yoM smoker followed for COPD/ bronchitis, OSA/ CPAP Stays SOB all the time-even at rest; wheezing, coughing-productive-yellow in color, chest congestion, nasal congestion,. He is more interested today in smoking cessation which we discussed. He had quit for Daliresp after 10 days because of sustained nausea. He is taking all of his regular medicines and using his rescue inhaler several times a day. Off prednisone now for 1-2 months. CXR 01/23/12-  IMPRESSION:  No acute cardiopulmonary abnormality.  Original Report Authenticated By: Randall An, M.D.   07/13/12- 59 yoM smoker followed for COPD/ bronchitis, OSA/ CPAP, complicated by CAD Pt states increased sob,wheezing,productive cough x3 months. Still smoking with no serious effort to stop. His sister has lung cancer which has gotten his attention and he may be willing to try harder. Some days his breathing is better than others with no real trend. Coughing productive of white sputum with no blood. He has all of his medications currently.  07/30/12- 59 yoM  smoker followed for COPD/ bronchitis, OSA/ CPAP, complicated by CAD Cough-productive-yellow and thick; SOB, wheezing, chest congestion, head congestion x 1 week; O2 sat of 83% RA entered room-20 seconds RA sat of 90% CT 07/21/12-  IMPRESSION:  1. Tiny bilateral lung nodules which are similar to on the prior  exam, given differences in slice selection and measurement error.  Likely subpleural lymph  nodes.  2. Age advanced coronary artery atherosclerosis. Recommend  assessment of coronary risk factors and consideration of medical  therapy.  3. Slight enlargement of splenic lesion since 2007. Favored to  represent a hemangioma or lymphangioma.  Original Report Authenticated By: Areta Haber, M.D.   08/07/12- 59 yoM smoker followed for COPD/ bronchitis, OSA/ CPAP, complicated by CAD, disability for back pain. Cough-productive-white mostly; SOB , wheezing-worse at night     wife here Coughing spasms he can feel his upper airway is closing off. He he still smokes a little even when he feels badly and we went over this again today offering support.  11/13/12- 59 yoM smoker followed for COPD/ bronchitis, OSA/ CPAP, complicated by CAD, disability for back pain. FOLLOWS VEL:FYBOF still but not productive-more SOB  Went to ER in September or acute exacerbation of COPD but not admitted. Today he is a scheduled visit. He actually says he is doing pretty well with no acute problems. Perennial nasal congestion. He has reduced his cigarette smoking to about 2 per day and is strongly encouraged to stop completely now.  01/25/13-  59 yoM smoker followed for COPD/ bronchitis, OSA/ CPAP, complicated by CAD, disability for back pain. ACUTE VISIT: started smoking agian-discuss taking Chantix; feels raw in chest area with deep cough x 2 weeks. He feels well at this visit, meaning he is at his baseline some daily nonproductive cough and shortness of breath with exertion.  05/17/13-59 yoM smoker followed for COPD/ bronchitis, OSA/ CPAP, complicated by CAD, disability for back pain. FOLLOWS BPZ:WCHENIDPO is about the same as last visit; has lessened the about the amount he is smoking. Chantix helps. Continues CPAP/ 10/[facility] Discussed smoking cessation effort.  06/10/13- 59 yoM smoker followed for COPD/ bronchitis, OSA/ CPAP, complicated by CAD, disability for back pain. ACUTE VISIT: prod cough with yellow/green  mucus, increased SOB, wheezing, tightness in chest, chills x1 week.  still on augmentin and pred taper from 7/15 phone note > no better Trying Chantix. Wife is also sick. There exposed to his brother's child who has frequent colds. Just started prednisone taper and Augmentin yesterday. Had teeth pulled.  09/16/13- 57 yoM smoker followed for COPD/ bronchitis, OSA/ CPAP, complicated by CAD, disability for back pain. FOLLOWS FOR: was given the flu shot about 1 month and has not felt good since; continues to keep a headache, chest congestion at night. Describes frontal headache, head and chest congestion since a flu shot. Using Chantix, has reduced smoking to 2 or 3 cigarettes daily. Denies infection-no fever, sputum not purulent or bloody. No chest pain. Stays on Mucinex. Not using CPAP because of head congestion. Sister has died of lung cancer/smoker.  Oct 26, 2013- 57 yoM smoker followed for COPD/ bronchitis, OSA/ CPAP, SqCell CA LUL,complicated by CAD, disability for back pain. FOLLOWS FOR: review bx results with patient; continues to have bad frontal lobe headache and lots of congestion. Nausea last night. No vomiting. Abnl CXR > CT chest/ large LUL mass to hilum. We had notified pt and scheduled bx. He is not coughing up much, denies chest pain. New headache mostly left frontal and  retro-orbital x 1 month with no lateralizing neuro. Remains tight on routine meds, off prednisone.  His wife blames "anxiety" for ER trip/ nebulizer the night before his bronchoscopy. Sister recently died of small cell Ca lung.  CT chest 09/17/13- IMPRESSION:  1. 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 This is associated with  some postobstructive changes in the left upper lobe as well as left  hilar and AP window lymphadenopathy. Based on these findings, and  assuming lack of distant metastatic disease (which will have to be  proven by other imaging  studies), this is favored to represent at  least T2b (if not T3 because of satellite nodules in the left upper  lobe), N2, Mx disease (i.e., at least stage IIIA disease). Further  evaluation with PET-CT and brain MRI with and without IV gadolinium  is suggested at this time for complete staging.  2. Mild centrilobular and paraseptal emphysema.  3. Atherosclerosis, including 3 vessel coronary artery disease.  Assessment for potential risk factor modification, dietary therapy  or pharmacologic therapy may be warranted, if clinically indicated.  4. Additional incidental findings, as above.  Electronically Signed  By: Vinnie Langton M.D.  On: 09/22/2013 11:34  Bronch / needle bx 10/04/13- by Dr Lamonte Sakai Pos Squamous Cell Ca  1/23//15- 57 yoM former smoker followed for COPD/ bronchitis, OSA/ CPAP, SqCell CA LUL/ XRT/Chemo, complicated by CAD, disability for back pain. Myocardial Perfusion 11/19/13- EF 65%, Nl wall motion, no ischemic changes. Follows for: frontal lobe headaches have gone away, SOB constantly, some prod cough with white/clear mucous.  Not smoking.  Nearing completion of current rounds of Chemo/ XRT.  Minor sore throat, breathing ok- some SOB, dry cough.  No recent steroids or ABX except Advair.  Continues CPAP 10/ [facility] with no problem.   01/06/13- 57 yoM former smoker followed for COPD/ bronchitis, OSA/ CPAP, SqCell CA LUL/ XRT/Chemo, complicated by CAD, disability for back pain. ACUTE VISIT:  Increase sob, wheezing and cough with congestion head and chest. Also, has bodyaches and sweats Acute illness x1 week started as head congestion moving to his chest with fever, white sputum turning yellow, myalgias. No blood. Had had flu shot. Has completed radiation and chemotherapy pending next oncology followup. Admits smoking 2 cigarettes since last here.  03/02/14- 64 yoM some-time smoker followed for COPD/ bronchitis, OSA/ CPAP, SqCell CA LUL/ XRT/Chemo, complicated by CAD, disability  for back pain. FOLLOWS FOR: Post hospital; Pt states that continues to have congestion, wheezing, and having left sided chest pain this morning. Had reaction to chemo yesterday-started having trouble breathing and hot flashes-was given breathing tx and injection. Hospitalized in with pneumonia, COPD with asthma 2 weeks ago. CT chest 01/28/2014 showed his left upper lobe mass to be cavitating. Yesterday he had reaction to chemotherapy requiring nebulizer treatment and injection. More dyspnea on exertion at baseline, gradually worse over the past month. Coughing clear phlegm. Nebulizer helps temporarily. This morning head left upper anterior lateral chest pain which is gradually fading. He admits he is smoking some-discussed. Continues CPAP 10/[facility] CXR 02/15/14 IMPRESSION:  No acute findings and no change from the study performed earlier  this same day.  Electronically Signed  By: Lajean Manes M.D.  On: 02/15/2014 09:23  04/19/14- 86 yoM some-time smoker followed for COPD/ bronchitis, OSA/ CPAP, SqCell CA LUL/ XRT/Chemo, complicated by CAD, disability for back pain, DM. FOLLOWS FOR: Breathing is terrible. Recently had PNA-Dr Yoo-still on abx  for this. CPAP 10/ O2 2L/ [facility] for sleep Walk test today 3x 180 m on room air- lowest sat was 92%. Scheduled for 3 more days of Levaquin for pneumonia. Improving significantly now. Cough is productive white. CT chest 04/12/14 IMPRESSION:  1. Favor response to therapy of a residual cavitary left upper lobe  lung lesion. Decreased size of the cavitary component with increased  surrounding soft tissue thickening, favored to be treatment related.  Similarly, evolving radiation change within the surrounding left  upper lobe and left apex. The apical presumed radiation change  obscures the previously described left apical pulmonary nodule.  Recommend attention on follow-up.  2. Similar borderline prevascular adenopathy.  3. Resolved left-sided pleural effusion  with trace left pleural  fluid remaining.  4. Minimal left lower lobe nodularity which could be infectious or  inflammatory. Recommend attention on follow-up.  5. Pulmonary artery enlargement suggests pulmonary arterial  hypertension.  6. Incompletely imaged similar cystic/septated splenic lesion. Favor  a benign lesion such as a lymphangioma.  Electronically Signed  By: Abigail Miyamoto M.D.  On: 04/12/2014 13:57  07/19/14- 32 yoM some-time smoker followed for COPD/ bronchitis, OSA/ CPAP, SqCell CA LUL/ XRT/Chemo, complicated by CAD, disability for back pain, DM.    Wife here FOLLOWS FOR: has had choking cough since last here-productive and clear in color. SOB and wheezing. He says cough is not new and not changed. No blood. Occasional localized left parasternal pain lasts for a day or 2 at a time. He is using rescue inhaler too frequently and we discussed use of maintenance controller and nebulizer machine more appropriately. Takes occasional Nuvigil but still needs to nap. Continues Nexium without recognizing much heartburn.  10/19/14- 58 yoM former smoker followed for COPD/ bronchitis, OSA/ CPAP, SqCell CA LUL/ XRT/Chemo, complicated by CAD, disability for back pain, DM.     FOLLOWS FOR: Increased SOB and wheezing, Chest congestion-unable to cough anything up, chills as well x 2 weeks, no fever, blood, or purulent. Sleeps propped up recliner. Neb 4-5x/ day, frequent rescue inhaler. Sleep O2 2l/ [facility] with CPAP 10 are ok used every night. Needs O2 day/ portable Walk test today - 85% on RA at rest, corrected w O2.. CT 07/19/14 IMPRESSION: Decreased size of cavitary component of left upper lobe mass lesion corresponding to the reported history of the lung cancer. The degree of adjacent volume loss and masslike consolidation of the left upper lobe is increased since previously which may reflect treatment effect secondary to radiation fibrosis. This is amenable to followup and continued  surveillance. No new evidence for intrathoracic metastatic disease. Electronically Signed  By: Conchita Paris M.D.  On: 07/19/2014 14:06  11/15/2014 Acute OV  Complains of cough, congestion , wheezing , dyspnea and tightness for last 3 weeks. Was called in prednisone taper last week  with only minimal improvement . Coughing up thick mucus but unable to get anything up .  Was seen by PCP initially tx w/ Doxycycline on 10/26/14  Seen by cardiology last week , Imdur increased for chest tightness.  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 tx w/ chemo and XRT  CT on 10/21/14 showed stable LUL lesion and increased aspdz in LLL likely from radiation changes. New patchy opacity in RLL suspicious for infection/inflammation.  No evidence of lymphadenopathy.  Pt is followed by [name] and has repeat CT chest planned  in 12/2014.  rec Begin Levaquin 500 mg daily for 7 days Mucinex DM twice daily as needed for cough and congestion Prednisone taper over the next week Fluids and rest    12/15/2014  Acute  ov/Wert re: refractory cough/ wheeze across  the room  No better p pred/levaquin  Chief Complaint  Patient presents with  . Acute Visit    Pt c/o cough and increased SOB for the past month. He has prod cough with minimal white sputum. He states that he is SOB with any exertion- using albuterol inhaler 3-4 x per day and neb with albuterol 5 x per day.   sob at rest is better  when on 3lpm  Walking on up 3lpm  Sleeping about 30 degrees in recliner 3lpm but using neb 3 x nightly and only a little better transiently, no resp to rob with codie, no purulent sputum or hemoptysis gen ant cp with coughing only  > Changed Advair to budesonide and Perforomist nebulizer  12/29/2014 follow-up COPD exacerbation Patient returns for a two-week  follow-up Last visit. Patient was having persistent cough, wheezing and shortness of breath. He been seen 6 weeks ago for COPD exacerbation. Given Levaquin and prednisone taper.[name] He continues to have intermittent wheezing and shortness of breath with increased albuterol use. Chest x-ray showed chronic changes He was recommended to change Advair to budesonide and Perforomist nebulizer. However, insurance would not cover Complains of cough, wheezing and shortness of breath with activity. Patient is supposed to be on oxygen at 3 L with activity. However, admits that he does not wear his oxygen on a routine basis. He denies any hemoptysis, orthopnea, PND, or increased leg swelling   Current Medications, Allergies, Complete Past Medical History, Past Surgical History, Family History, and Social History were reviewed in [employer] record.  ROS  The following are not active complaints unless bolded sore throat, dysphagia, dental problems, itching, sneezing,  nasal congestion or excess/ purulent secretions, ear ache,   fever, chills, sweats, unintended wt loss, pleuritic or exertional cp, hemoptysis,  orthopnea pnd or leg swelling, presyncope, palpitations, heartburn, abdominal pain, anorexia, nausea, vomiting, diarrhea  or change in bowel or urinary habits, change in stools or urine, dysuria,hematuria,  rash, arthralgias, visual complaints, headache, numbness weakness or ataxia or problems with walking or coordination,  change in mood/affect or memory.          Objective:   Physical Exam:   Vital signs reviewed   HEENT: nl dentition, turbinates, and orophanx. Nl external ear canals without cough reflex   NECK :  without JVD/Nodes/TM/ nl carotid upstrokes bilaterally   LUNGS: no acc muscle use, exp wheezing , no stridor.    CV:  RRR  no s3 or murmur or increase in P2, no edema   ABD:  soft and nontender with nl excursion in the supine position. No bruits or  organomegaly, bowel sounds nl  MS:  warm without deformities, calf tenderness, cyanosis or clubbing  SKIN: warm and dry without lesions    NEURO:  alert, approp, no deficits   CXR PA and Lateral:   12/15/2014 :     I personally reviewed images and agree with radiology impression as follows:    Stable chronic consolidation and postradiation changes in left upper  lobe. No definite superimposed infiltrate or pulmonary edema.

## 2014-12-29 NOTE — Patient Instructions (Signed)
Wear Oxygen 3l/m with activity and At bedtime  .  Prednisone taper .  Stop Advair .  Begin symbicort 2 puffs Twice daily  , rinse after use.  Follow up Dr. Annamaria Boots  In 4 weeks and As needed   Please contact office for sooner follow up if symptoms do not improve or worsen or seek emergency care

## 2014-12-29 NOTE — Assessment & Plan Note (Signed)
Slow to resolve exacerbation Questionable upper airway irritation from Advair Chest x-ray has been unrevealing with chronic changes Advise patient on oxygen compliance  Plan  Wear Oxygen 3l/m with activity and At bedtime  .  Prednisone taper .  Stop Advair .  Begin symbicort 2 puffs Twice daily  , rinse after use.  Follow up Dr. Annamaria Boots  In 4 weeks and As needed   Please contact office for sooner follow up if symptoms do not improve or worsen or seek emergency care

## 2015-01-03 ENCOUNTER — Telehealth: Payer: Self-pay | Admitting: Internal Medicine

## 2015-01-03 DIAGNOSIS — Z1211 Encounter for screening for malignant neoplasm of colon: Secondary | ICD-10-CM

## 2015-01-03 NOTE — Telephone Encounter (Signed)
Patient needs a referral to see Dr. Deatra Ina for pre-op 02/15/15 and colonoscopy on 03/01/15.

## 2015-01-03 NOTE — Telephone Encounter (Signed)
Referral order placed.

## 2015-01-04 ENCOUNTER — Telehealth: Payer: Self-pay | Admitting: *Deleted

## 2015-01-04 ENCOUNTER — Telehealth: Payer: Self-pay | Admitting: Internal Medicine

## 2015-01-04 NOTE — Telephone Encounter (Signed)
Spoke with pt's wife, states that TP switched pt from advair to symbicort.  Pt's wife states the symbicort is not working well for patient.  Pt's wife is wanting to know if CY is recommending pt goes back on advair, or has another suggestion to help pt breathe better.  Last ov: 12/29/14 Next ov: 01/19/15  Cy please advise.  Thanks!!  Allergies  Allergen Reactions  . Carboplatin Shortness Of Breath, Swelling and Rash    Swelling of lips, rash on face,eyes and head   Current Outpatient Prescriptions on File Prior to Visit  Medication Sig Dispense Refill  . acetaminophen-codeine (TYLENOL #3) 300-30 MG per tablet One every 4 hours as needed for cough 40 tablet 0  . albuterol (PROVENTIL) (2.5 MG/3ML) 0.083% nebulizer solution INHALE 1 VIAL IN NEBULIZER EVERY 4 HOURS AS NEEDED FOR WHEEZING OR SHORTNESS OF BREATH 300 mL 1  . ALPRAZolam (XANAX) 0.25 MG tablet Take 0.25 mg by mouth daily as needed.    . ARIPiprazole (ABILIFY) 5 MG tablet Take 1 tablet (5 mg total) by mouth at bedtime.    . Armodafinil (NUVIGIL) 150 MG tablet Take 150 mg by mouth daily.    [name] atorvastatin (LIPITOR) 20 MG tablet TAKE 1 TABLET (20 MG TOTAL) BY MOUTH DAILY. 90 tablet 3  . clonazePAM (KLONOPIN) 1 MG tablet Take 2 mg by mouth at bedtime as needed. For anxiety/insomnia    . esomeprazole (NEXIUM) 40 MG capsule Take 40 mg by mouth daily.    [name] FLUoxetine (PROZAC) 40 MG capsule Take 40 mg by mouth daily.      . fluticasone (FLONASE) 50 MCG/ACT nasal spray Place 2 sprays into both nostrils daily. 16 g 3  . Fluticasone-Salmeterol (ADVAIR DISKUS) 250-50 MCG/DOSE AEPB Inhale 1 puff into the lungs 2 (two) times daily. 60 each 5  . gabapentin (NEURONTIN) 100 MG capsule Take 100 mg by mouth daily.     . Insulin Glargine (LANTUS SOLOSTAR) 100 UNIT/ML Solostar Pen 5 units at bedtime. 5 pen PRN  . Insulin Pen Needle (BD PEN NEEDLE NANO U/F) 32G X 4 MM MISC 1 each by Does not apply route daily. 100 each 3  . isosorbide mononitrate (IMDUR)  60 MG 24 hr tablet Take 1 tablet (60 mg total) by mouth daily. 90 tablet 3  . methocarbamol (ROBAXIN) 500 MG tablet Take 500 mg by mouth 2 (two) times daily as needed for muscle spasms.     . metoprolol succinate (TOPROL-XL) 25 MG 24 hr tablet TAKE 1/2 TABLET BY MOUTH DAILY 30 tablet 5  . mirtazapine (REMERON) 45 MG tablet Take 1 tablet (45 mg total) by mouth at bedtime.    [name] morphine (MS CONTIN) 30 MG 12 hr tablet Take 30 mg by mouth 3 (three) times daily.    . nitroGLYCERIN (NITROSTAT) 0.4 MG SL tablet Place 1 tablet (0.4 mg total) under the tongue every 5 (five) minutes as needed for chest pain. 25 tablet 3  . potassium chloride SA (K-DUR,KLOR-CON) 20 MEQ tablet Take 20 mEq by mouth daily.    . predniSONE (DELTASONE) 10 MG tablet Take 1 tablet (10 mg total) by mouth daily with breakfast. 31 tablet 3  . predniSONE (DELTASONE) 10 MG tablet 4 tabs for 3 days, then 3 tabs for 3 days, 2 tabs for 3 days, then 1 tab daily 30 tablet 0  . prochlorperazine (COMPAZINE) 10 MG tablet TAKE 1 TABLET BY MOUTH EVERY 6 HOURS AS NEEDED FOR NAUSEA AND VOMITING 60 tablet 3  .  VOLTAREN 1 % GEL Apply 2 g topically daily as needed (for pain).      No current facility-administered medications on file prior to visit.

## 2015-01-04 NOTE — Telephone Encounter (Signed)
His nebulizer machine, used every 4-6 hours, may get in better than an inhaler. We can go back to Advair, or use whichever comparable maintenance inhaler his insurance prefers: Advair, Symbicort, [place]

## 2015-01-04 NOTE — Telephone Encounter (Signed)
lmtcb for pt.  

## 2015-01-04 NOTE — Telephone Encounter (Addendum)
Wife is at work and called reporting Ernest [name] is having trouble breathing still.  "He is depressed using Oxygen all the time now and puts the albuterol medicine in there.  Ambulance was called a few weeks ago.  Coughs stringy white phlegm.  He does not have a fever and keeps air conditioner on."  Noted note from Dr. Annamaria Boots for a steroid for maintenance.  Says she has the Advair and a Symbicort but he is not using both.  CT chest ordered 01-26-2015 with F/U with [name] on 01-31-2015.  Will notify [name] of this call and wife will await return call from Dr. Annamaria Boots.       No new orders received from [name].  Patient to call Pulmonologist which wife reports she has done so.

## 2015-01-05 MED ORDER — AMOXICILLIN-POT CLAVULANATE 875-125 MG PO TABS: 1.0000 | ORAL_TABLET | Freq: Two times a day (BID) | ORAL | Status: DC

## 2015-01-05 NOTE — Telephone Encounter (Signed)
For the sinus trouble offer augmentin 875, # 20, 1 twice daily

## 2015-01-05 NOTE — Telephone Encounter (Signed)
Called pt spouse and is aware of recs. RX sent in.

## 2015-01-05 NOTE — Telephone Encounter (Signed)
Called, spoke with pt's spouse.  Discussed below per CY.  She verbalized understanding and would like pt to go back to Advair.  Pt currently has mediation and does not need rx at this time.  Wife will let us know if insurance does not cover when they do pick up rx.  Reports pt c/o PND, cough with yellowish green mucus, sinus pressure, HA, and increased SOB x 2 days. No chest tightness/CP or f/c/s.  Using fluticasone nasal spray with no relief.  Requesting further recs.  Dr. Annamaria Boots, pls advise.  Thank you.  [facility].  Allergies  Allergen Reactions  . Carboplatin Shortness Of Breath, Swelling and Rash    Swelling of lips, rash on face,eyes and head     Current Outpatient Prescriptions on File Prior to Visit  Medication Sig Dispense Refill  . acetaminophen-codeine (TYLENOL #3) 300-30 MG per tablet One every 4 hours as needed for cough 40 tablet 0  . albuterol (PROVENTIL) (2.5 MG/3ML) 0.083% nebulizer solution INHALE 1 VIAL IN NEBULIZER EVERY 4 HOURS AS NEEDED FOR WHEEZING OR SHORTNESS OF BREATH 300 mL 1  . ALPRAZolam (XANAX) 0.25 MG tablet Take 0.25 mg by mouth daily as needed.    . ARIPiprazole (ABILIFY) 5 MG tablet Take 1 tablet (5 mg total) by mouth at bedtime.    . Armodafinil (NUVIGIL) 150 MG tablet Take 150 mg by mouth daily.    [name] atorvastatin (LIPITOR) 20 MG tablet TAKE 1 TABLET (20 MG TOTAL) BY MOUTH DAILY. 90 tablet 3  . clonazePAM (KLONOPIN) 1 MG tablet Take 2 mg by mouth at bedtime as needed. For anxiety/insomnia    . esomeprazole (NEXIUM) 40 MG capsule Take 40 mg by mouth daily.    [name] FLUoxetine (PROZAC) 40 MG capsule Take 40 mg by mouth daily.      . fluticasone (FLONASE) 50 MCG/ACT nasal spray Place 2 sprays into both nostrils daily. 16 g 3  . Fluticasone-Salmeterol (ADVAIR DISKUS) 250-50 MCG/DOSE AEPB Inhale 1 puff into the lungs 2 (two) times daily. 60 each 5  . gabapentin (NEURONTIN) 100 MG capsule Take 100 mg by mouth daily.     . Insulin Glargine (LANTUS SOLOSTAR) 100  UNIT/ML Solostar Pen 5 units at bedtime. 5 pen PRN  . Insulin Pen Needle (BD PEN NEEDLE NANO U/F) 32G X 4 MM MISC 1 each by Does not apply route daily. 100 each 3  . isosorbide mononitrate (IMDUR) 60 MG 24 hr tablet Take 1 tablet (60 mg total) by mouth daily. 90 tablet 3  . methocarbamol (ROBAXIN) 500 MG tablet Take 500 mg by mouth 2 (two) times daily as needed for muscle spasms.     . metoprolol succinate (TOPROL-XL) 25 MG 24 hr tablet TAKE 1/2 TABLET BY MOUTH DAILY 30 tablet 5  . mirtazapine (REMERON) 45 MG tablet Take 1 tablet (45 mg total) by mouth at bedtime.    [name] morphine (MS CONTIN) 30 MG 12 hr tablet Take 30 mg by mouth 3 (three) times daily.    . nitroGLYCERIN (NITROSTAT) 0.4 MG SL tablet Place 1 tablet (0.4 mg total) under the tongue every 5 (five) minutes as needed for chest pain. 25 tablet 3  . potassium chloride SA (K-DUR,KLOR-CON) 20 MEQ tablet Take 20 mEq by mouth daily.    . predniSONE (DELTASONE) 10 MG tablet Take 1 tablet (10 mg total) by mouth daily with breakfast. 31 tablet 3  . predniSONE (DELTASONE) 10 MG tablet 4 tabs for 3 days, then 3 tabs for 3  days, 2 tabs for 3 days, then 1 tab daily 30 tablet 0  . prochlorperazine (COMPAZINE) 10 MG tablet TAKE 1 TABLET BY MOUTH EVERY 6 HOURS AS NEEDED FOR NAUSEA AND VOMITING 60 tablet 3  . VOLTAREN 1 % GEL Apply 2 g topically daily as needed (for pain).      No current facility-administered medications on file prior to visit.

## 2015-01-06 DIAGNOSIS — M47817 Spondylosis without myelopathy or radiculopathy, lumbosacral region: Secondary | ICD-10-CM | POA: Diagnosis not present

## 2015-01-06 DIAGNOSIS — M961 Postlaminectomy syndrome, not elsewhere classified: Secondary | ICD-10-CM | POA: Diagnosis not present

## 2015-01-06 DIAGNOSIS — G894 Chronic pain syndrome: Secondary | ICD-10-CM | POA: Diagnosis not present

## 2015-01-06 DIAGNOSIS — Z79891 Long term (current) use of opiate analgesic: Secondary | ICD-10-CM | POA: Diagnosis not present

## 2015-01-06 DIAGNOSIS — M1611 Unilateral primary osteoarthritis, right hip: Secondary | ICD-10-CM | POA: Diagnosis not present

## 2015-01-09 ENCOUNTER — Telehealth: Payer: Self-pay | Admitting: Internal Medicine

## 2015-01-09 NOTE — Telephone Encounter (Signed)
He should contact doctor who proscribed augmentin and notify them of diarrhea problem.  How many BMs per day, watery or just loose?  Abdominal pain?

## 2015-01-09 NOTE — Telephone Encounter (Signed)
Pt started an antibiotic 4 days ago and has had diarrhea for three days. He is not supposed to take loperamide so wife was calling to get advise on what he should do.

## 2015-01-09 NOTE — Telephone Encounter (Signed)
Average 6-7 BMs per day, pt states it has slowed down today.  Forwarded to Dr Annamaria Boots for review

## 2015-01-09 NOTE — Telephone Encounter (Signed)
PLEASE NOTE: All timestamps contained within this report are represented as [place] Standard Time. CONFIDENTIALTY NOTICE: This fax transmission is intended only for the addressee. It contains information that is legally privileged, confidential or otherwise protected from use or disclosure. If you are not the intended recipient, you are strictly prohibited from reviewing, disclosing, copying using or disseminating any of this information or taking any action in reliance on or regarding this information. If you have received this fax in error, please notify us immediately by telephone so that we can arrange for its return to [place]. Phone: [phone], Toll-Free: [phone], Fax: [phone] Page: 1 of 1 Call Id: [id number] [facility] [place] Patient Name: Ernest [name] DOB: [birth year] Initial Comment caller states her husband was put on antibiotics for a sinus infection - has had diarrhea for the last 3 days Nurse Assessment Nurse: Loletta Specter, RN, Wells Guiles Date/Time (Eastern Time): 01/09/2015 10:32:55 AM Confirm and document reason for call. If symptomatic, describe symptoms. ---caller states her husband was put on antibiotics for a sinus infection - has had diarrhea for the last 3 days. Pt is taking augmentin, started 4 days ago. Has the patient traveled out of the country within the last 30 days? ---Not Applicable Does the patient require triage? ---Yes Related visit to physician within the last 2 weeks? ---No Does the PT have any chronic conditions? (i.e. diabetes, asthma, etc.) ---Yes List chronic conditions. ---diabetes, history cancer. Guidelines Guideline Title Affirmed Question Affirmed Notes Diarrhea [1] Age > 60 years AND [2] > 6 diarrhea stools in past 24 hours Final Disposition User See Physician within 4 Hours (or PCP triage) Loletta Specter, RN, Wells Guiles

## 2015-01-09 NOTE — Telephone Encounter (Signed)
Potential for C. Diff infection of gut if frequent loose stools continue. Suggest imodium and a probiotic like Florastor or Align otc.

## 2015-01-09 NOTE — Telephone Encounter (Signed)
Spoke with patient, advised per CY rec's, pt states that he will let us know if anything further needed or if symptoms do not improve while on OTC meds.  Will send to [facility] as FYI that patient is aware.

## 2015-01-09 NOTE — Telephone Encounter (Signed)
Duplicate

## 2015-01-18 DIAGNOSIS — J449 Chronic obstructive pulmonary disease, unspecified: Secondary | ICD-10-CM | POA: Diagnosis not present

## 2015-01-19 ENCOUNTER — Encounter: Payer: Self-pay | Admitting: Internal Medicine

## 2015-01-19 ENCOUNTER — Ambulatory Visit (INDEPENDENT_AMBULATORY_CARE_PROVIDER_SITE_OTHER): Payer: Commercial Managed Care - HMO | Admitting: Internal Medicine

## 2015-01-19 VITALS — BP 140/72 | HR 77 | Ht 66.0 in | Wt 203.0 lb

## 2015-01-19 DIAGNOSIS — J449 Chronic obstructive pulmonary disease, unspecified: Secondary | ICD-10-CM

## 2015-01-19 DIAGNOSIS — J441 Chronic obstructive pulmonary disease with (acute) exacerbation: Secondary | ICD-10-CM | POA: Diagnosis not present

## 2015-01-19 DIAGNOSIS — G4733 Obstructive sleep apnea (adult) (pediatric): Secondary | ICD-10-CM

## 2015-01-19 MED ORDER — FLUTICASONE-SALMETEROL 500-50 MCG/DOSE IN AEPB: INHALATION_SPRAY | RESPIRATORY_TRACT | Status: DC

## 2015-01-19 MED ORDER — ALBUTEROL SULFATE (2.5 MG/3ML) 0.083% IN NEBU: INHALATION_SOLUTION | RESPIRATORY_TRACT | Status: DC

## 2015-01-19 MED ORDER — TIOTROPIUM BROMIDE-OLODATEROL 2.5-2.5 MCG/ACT IN AERS: 2.0000 | INHALATION_SPRAY | Freq: Every day | RESPIRATORY_TRACT | Status: DC

## 2015-01-19 MED ORDER — THEOPHYLLINE ER 200 MG PO TB12: 200.0000 mg | ORAL_TABLET | Freq: Two times a day (BID) | ORAL | Status: DC

## 2015-01-19 NOTE — Patient Instructions (Signed)
Sample if available Stiolto inhaler    2 puffs, one time daily   Try this instead of Advair  Scripts sent changing Advair to 500, resuming theophylline and refilling the nebulizer solution

## 2015-01-19 NOTE — Progress Notes (Signed)
Patient ID: Ernest [name], male    DOB: [birth year], 59 y.o.   MRN: [id number]  HPI 07/30/11- 9 yoM smoker followed for COPD/ bronchitis, OSA/ CPAP Last here 04/01/11- note reviewed. He had felt better on a trial of maintenance prednisone. He has felt more short of breath through the hot summer, with no acute event and without blood, chest pain or fever. Easy DOE around home. Has been sharing nebulizer with his wife, who now has a cold and we discussed this. Has been worse in last month, and expecially in recent rainy weather. Noting a little ankle swelling. Smothered trying to take a shower.  Only tolerating his CPAP about 3 nights/ week because he feels smothered, waking sore in anterior chest in the mornings.  Now down to only 2-3 cigarettes/ day and trying to stop.  CXR 08/14/10- Nl NAD PFT 05/26/08- FEV1/FVC 2.44/ 0.76, incr 19% w/ BD, R 0.59 ?DLCO 0.69  09/10/11- 55 yoM smoker followed for COPD/ bronchitis, OSA/ CPAP He reports feeling better "finally doing okay". He is down to just a couple of cigarettes a day. He is using CPAP regularly now and is comfortable with it. Alpha I antitrypsin Report came back normal "M. M." on 08/09/2011. Chest x-ray from 08/15/2011 showed stable changes of COPD. He has had flu vaccine.  11/12/11- 41 yoM smoker followed for COPD/ bronchitis, OSA/ CPAP Here with wife. Has had flu shot. He says he was doing well until 2 weeks ago. He again is coughing thick mucus which is difficult to clear. He depends on his metered inhaler and nebulizer to help clear his airways. Started Mucinex 3 days ago. Mucus is white. He denies fever, sore throat, chest pain, GI upset. Because of orthopnea he is sitting up to sleep for the past 2 nights.  01/14/12- 55 yoM smoker followed for COPD/ bronchitis, OSA/ CPAP Increase congested cough green to yellow sputum but he does not feel sick and denies fever. Sister who was a smoker recently died of lung cancer. Despite that and all of our  discussions, he still smokes a few cigarettes daily. We talked again about motivation to stop completely. Continues CPAP and Provigil one or 2 daily.  03/10/12  56 yoM smoker followed for COPD/ bronchitis, OSA/ CPAP Persistent bronchitic cough. Sputum is not purulent. He is "working on" his smoking habit but blames pollen for keeping him S. coughing now. Remains fully compliant with CPAP, all night every night. Still needs Provigil to help with residual daytime sleepiness as before.  05/12/12- 56 yoM smoker followed for COPD/ bronchitis, OSA/ CPAP Stays SOB all the time-even at rest; wheezing, coughing-productive-yellow in color, chest congestion, nasal congestion,. He is more interested today in smoking cessation which we discussed. He had quit for Daliresp after 10 days because of sustained nausea. He is taking all of his regular medicines and using his rescue inhaler several times a day. Off prednisone now for 1-2 months. CXR 01/23/12-  IMPRESSION:  No acute cardiopulmonary abnormality.  Original Report Authenticated By: Randall An, M.D.   07/13/12- 51 yoM smoker followed for COPD/ bronchitis, OSA/ CPAP, complicated by CAD Pt states increased sob,wheezing,productive cough x3 months. Still smoking with no serious effort to stop. His sister has lung cancer which has gotten his attention and he may be willing to try harder. Some days his breathing is better than others with no real trend. Coughing productive of white sputum with no blood. He has all of his medications currently.  07/30/12- 33 yoM  smoker followed for COPD/ bronchitis, OSA/ CPAP, complicated by CAD Cough-productive-yellow and thick; SOB, wheezing, chest congestion, head congestion x 1 week; O2 sat of 83% RA entered room-20 seconds RA sat of 90% CT 07/21/12-  IMPRESSION:  1. Tiny bilateral lung nodules which are similar to on the prior  exam, given differences in slice selection and measurement error.  Likely subpleural lymph  nodes.  2. Age advanced coronary artery atherosclerosis. Recommend  assessment of coronary risk factors and consideration of medical  therapy.  3. Slight enlargement of splenic lesion since 2007. Favored to  represent a hemangioma or lymphangioma.  Original Report Authenticated By: Areta Haber, M.D.   08/07/12- 43 yoM smoker followed for COPD/ bronchitis, OSA/ CPAP, complicated by CAD, disability for back pain. Cough-productive-white mostly; SOB , wheezing-worse at night     wife here Coughing spasms he can feel his upper airway is closing off. He he still smokes a little even when he feels badly and we went over this again today offering support.  11/13/12- 56 yoM smoker followed for COPD/ bronchitis, OSA/ CPAP, complicated by CAD, disability for back pain. FOLLOWS SWN:IOEVO still but not productive-more SOB  Went to ER in September or acute exacerbation of COPD but not admitted. Today he is a scheduled visit. He actually says he is doing pretty well with no acute problems. Perennial nasal congestion. He has reduced his cigarette smoking to about 2 per day and is strongly encouraged to stop completely now.  01/25/13-  57 yoM smoker followed for COPD/ bronchitis, OSA/ CPAP, complicated by CAD, disability for back pain. ACUTE VISIT: started smoking agian-discuss taking Chantix; feels raw in chest area with deep cough x 2 weeks. He feels well at this visit, meaning he is at his baseline some daily nonproductive cough and shortness of breath with exertion.  05/17/13-57 yoM smoker followed for COPD/ bronchitis, OSA/ CPAP, complicated by CAD, disability for back pain. FOLLOWS JJK:[id number] is about the same as last visit; has lessened the about the amount he is smoking. Chantix helps. Continues CPAP/ 10/[facility] Discussed smoking cessation effort.  06/10/13- 57 yoM smoker followed for COPD/ bronchitis, OSA/ CPAP, complicated by CAD, disability for back pain. ACUTE VISIT: prod cough with yellow/green  mucus, increased SOB, wheezing, tightness in chest, chills x1 week.  still on augmentin and pred taper from 7/15 phone note > no better Trying Chantix. Wife is also sick. There exposed to his brother's child who has frequent colds. Just started prednisone taper and Augmentin yesterday. Had teeth pulled.  09/16/13- 57 yoM smoker followed for COPD/ bronchitis, OSA/ CPAP, complicated by CAD, disability for back pain. FOLLOWS FOR: was given the flu shot about 1 month and has not felt good since; continues to keep a headache, chest congestion at night. Describes frontal headache, head and chest congestion since a flu shot. Using Chantix, has reduced smoking to 2 or 3 cigarettes daily. Denies infection-no fever, sputum not purulent or bloody. No chest pain. Stays on Mucinex. Not using CPAP because of head congestion. Sister has died of lung cancer/smoker.  11/06/2013- 57 yoM smoker followed for COPD/ bronchitis, OSA/ CPAP, SqCell CA LUL,complicated by CAD, disability for back pain. FOLLOWS FOR: review bx results with patient; continues to have bad frontal lobe headache and lots of congestion. Nausea last night. No vomiting. Abnl CXR > CT chest/ large LUL mass to hilum. We had notified pt and scheduled bx. He is not coughing up much, denies chest pain. New headache mostly left frontal and  retro-orbital x 1 month with no lateralizing neuro. Remains tight on routine meds, off prednisone.  His wife blames "anxiety" for ER trip/ nebulizer the night before his bronchoscopy. Sister recently died of small cell Ca lung.  CT chest 09/17/13- IMPRESSION:  1. 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 This is associated with  some postobstructive changes in the left upper lobe as well as left  hilar and AP window lymphadenopathy. Based on these findings, and  assuming lack of distant metastatic disease (which will have to be  proven by other imaging  studies), this is favored to represent at  least T2b (if not T3 because of satellite nodules in the left upper  lobe), N2, Mx disease (i.e., at least stage IIIA disease). Further  evaluation with PET-CT and brain MRI with and without IV gadolinium  is suggested at this time for complete staging.  2. Mild centrilobular and paraseptal emphysema.  3. Atherosclerosis, including 3 vessel coronary artery disease.  Assessment for potential risk factor modification, dietary therapy  or pharmacologic therapy may be warranted, if clinically indicated.  4. Additional incidental findings, as above.  Electronically Signed  By: Vinnie Langton M.D.  On: 09/22/2013 11:34  Bronch / needle bx 10/04/13- by Dr Lamonte Sakai Pos Squamous Cell Ca  1/23//15- 57 yoM former smoker followed for COPD/ bronchitis, OSA/ CPAP, SqCell CA LUL/ XRT/Chemo, complicated by CAD, disability for back pain. Myocardial Perfusion 11/19/13- EF 65%, Nl wall motion, no ischemic changes. Follows for: frontal lobe headaches have gone away, SOB constantly, some prod cough with white/clear mucous.  Not smoking.  Nearing completion of current rounds of Chemo/ XRT.  Minor sore throat, breathing ok- some SOB, dry cough.  No recent steroids or ABX except Advair.  Continues CPAP 10/ [facility] with no problem.   01/06/13- 57 yoM former smoker followed for COPD/ bronchitis, OSA/ CPAP, SqCell CA LUL/ XRT/Chemo, complicated by CAD, disability for back pain. ACUTE VISIT:  Increase sob, wheezing and cough with congestion head and chest. Also, has bodyaches and sweats Acute illness x1 week started as head congestion moving to his chest with fever, white sputum turning yellow, myalgias. No blood. Had had flu shot. Has completed radiation and chemotherapy pending next oncology followup. Admits smoking 2 cigarettes since last here.  03/02/14- 37 yoM some-time smoker followed for COPD/ bronchitis, OSA/ CPAP, SqCell CA LUL/ XRT/Chemo, complicated by CAD, disability  for back pain. FOLLOWS FOR: Post hospital; Pt states that continues to have congestion, wheezing, and having left sided chest pain this morning. Had reaction to chemo yesterday-started having trouble breathing and hot flashes-was given breathing tx and injection. Hospitalized in with pneumonia, COPD with asthma 2 weeks ago. CT chest 01/28/2014 showed his left upper lobe mass to be cavitating. Yesterday he had reaction to chemotherapy requiring nebulizer treatment and injection. More dyspnea on exertion at baseline, gradually worse over the past month. Coughing clear phlegm. Nebulizer helps temporarily. This morning head left upper anterior lateral chest pain which is gradually fading. He admits he is smoking some-discussed. Continues CPAP 10/[facility] CXR 02/15/14 IMPRESSION:  No acute findings and no change from the study performed earlier  this same day.  Electronically Signed  By: Lajean Manes M.D.  On: 02/15/2014 09:23  04/19/14- 43 yoM some-time smoker followed for COPD/ bronchitis, OSA/ CPAP, SqCell CA LUL/ XRT/Chemo, complicated by CAD, disability for back pain, DM. FOLLOWS FOR: Breathing is terrible. Recently had PNA-Dr Yoo-still on abx  for this. CPAP 10/ O2 2L/ [facility] for sleep Walk test today 3x 180 m on room air- lowest sat was 92%. Scheduled for 3 more days of Levaquin for pneumonia. Improving significantly now. Cough is productive white. CT chest 04/12/14 IMPRESSION:  1. Favor response to therapy of a residual cavitary left upper lobe  lung lesion. Decreased size of the cavitary component with increased  surrounding soft tissue thickening, favored to be treatment related.  Similarly, evolving radiation change within the surrounding left  upper lobe and left apex. The apical presumed radiation change  obscures the previously described left apical pulmonary nodule.  Recommend attention on follow-up.  2. Similar borderline prevascular adenopathy.  3. Resolved left-sided pleural effusion  with trace left pleural  fluid remaining.  4. Minimal left lower lobe nodularity which could be infectious or  inflammatory. Recommend attention on follow-up.  5. Pulmonary artery enlargement suggests pulmonary arterial  hypertension.  6. Incompletely imaged similar cystic/septated splenic lesion. Favor  a benign lesion such as a lymphangioma.  Electronically Signed  By: Abigail Miyamoto M.D.  On: 04/12/2014 13:57  07/19/14- 69 yoM some-time smoker followed for COPD/ bronchitis, OSA/ CPAP, SqCell CA LUL/ XRT/Chemo, complicated by CAD, disability for back pain, DM.    Wife here FOLLOWS FOR: has had choking cough since last here-productive and clear in color. SOB and wheezing. He says cough is not new and not changed. No blood. Occasional localized left parasternal pain lasts for a day or 2 at a time. He is using rescue inhaler too frequently and we discussed use of maintenance controller and nebulizer machine more appropriately. Takes occasional Nuvigil but still needs to nap. Continues Nexium without recognizing much heartburn.  10/19/14- 58 yoM former smoker followed for COPD/ bronchitis, OSA/ CPAP, SqCell CA LUL/ XRT/Chemo, complicated by CAD, disability for back pain, DM.     FOLLOWS FOR: Increased SOB and wheezing, Chest congestion-unable to cough anything up, chills as well x 2 weeks, no fever, blood, or purulent. Sleeps propped up recliner. Neb 4-5x/ day, frequent rescue inhaler. Sleep O2 2l/ [facility] with CPAP 10 are ok used every night. Needs O2 day/ portable Walk test today - 85% on RA at rest, corrected w O2.. CT 07/19/14 IMPRESSION: Decreased size of cavitary component of left upper lobe mass lesion corresponding to the reported history of the lung cancer. The degree of adjacent volume loss and masslike consolidation of the left upper lobe is increased since previously which may reflect treatment effect secondary to radiation fibrosis. This is amenable to followup and continued  surveillance. No new evidence for intrathoracic metastatic disease. Electronically Signed  By: Conchita Paris M.D.  On: 07/19/2014 14:06  11/15/2014 Acute OV  Complains of cough, congestion , wheezing , dyspnea and tightness for last 3 weeks. Was called in prednisone taper last week  with only minimal improvement . Coughing up thick mucus but unable to get anything up .  Was seen by PCP initially tx w/ Doxycycline on 10/26/14  Seen by cardiology last week , Imdur increased for chest tightness.  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 tx w/ chemo and XRT  CT on 10/21/14 showed stable LUL lesion and increased aspdz in LLL likely from radiation changes. New patchy opacity in RLL suspicious for infection/inflammation.  No evidence of lymphadenopathy.  Pt is followed by [name] and has repeat CT chest planned  in 12/2014.  rec Begin Levaquin 500 mg daily for 7 days Mucinex DM twice daily as needed for cough and congestion Prednisone taper over the next week Fluids and rest  12/15/2014  Acute  ov/Wert re: refractory cough/ wheeze across  the room  No better p pred/levaquin  Chief Complaint  Patient presents with  . Acute Visit    Pt c/o cough and increased SOB for the past month. He has prod cough with minimal white sputum. He states that he is SOB with any exertion- using albuterol inhaler 3-4 x per day and neb with albuterol 5 x per day.   sob at rest is better  when on 3lpm  Walking on up 3lpm  Sleeping about 30 degrees in recliner 3lpm but using neb 3 x nightly and only a little better transiently, no resp to rob with codie, no purulent sputum or hemoptysis gen ant cp with coughing only  CXR PA and Lateral:   12/15/2014 :     I personally reviewed images and agree with radiology impression as follows:    Stable chronic  consolidation and postradiation changes in left upper  lobe. No definite superimposed infiltrate or pulmonary edema.   01/19/15- 71 yoM former smoker followed for COPD/ bronchitis, chronic hypoxic Resp Failure, OSA/ CPAP, SqCell CA LUL/ XRT/Chemo, complicated by CAD, disability for back pain, DM.  FOLLOWS FOR: Pt has restarted Theophylline ER 200mg  1 tablet BID and seems to be helping his breathing; would like to have Rx for this. CPAP 10/ O2 3 L/ [facility]  Next CT sched in March He struggles for breathing comfort now at baseline without a distinct acute event recently. He asks to increase the strength of his Advair.  ROS-see HPI   Negative unless "+" Constitutional:    weight loss, night sweats, fevers, chills, fatigue, lassitude. HEENT:    headaches, difficulty swallowing, tooth/dental problems, sore throat,       sneezing, itching, ear ache, nasal congestion, post nasal drip, snoring CV:    chest pain, orthopnea, PND, swelling in lower extremities, anasarca,                                  dizziness, palpitations Resp:   +shortness of breath with exertion or at rest.                +productive cough,   +non-productive cough, coughing up of blood.              change in color of mucus.  +wheezing.   Skin:    rash or lesions. GI:  No-   heartburn, indigestion, abdominal pain, nausea, vomiting,  GU:  MS:   joint pain, stiffness,  Neuro-     nothing unusual Psych:  change in mood or affect.  depression or anxiety.   memory loss.     Objective:   OBJ- Physical Exam General- Alert, Oriented, Affect-appropriate, Distress- none acute, + chronically ill Skin- rash-none, lesions- none, excoriation- none Lymphadenopathy- none Head- atraumatic            Eyes- Gross vision intact, PERRLA, conjunctivae and secretions clear            Ears- Hearing, canals-normal            Nose- Clear, no-Septal dev, mucus, polyps, erosion, perforation             Throat- Mallampati II , mucosa clear ,  drainage- none, tonsils- atrophic, + hoarse Neck- flexible , trachea midline, no stridor , thyroid nl, carotid no bruit Chest - symmetrical excursion , unlabored           Heart/CV- RRR , no murmur , no gallop  , no rub, nl s1 s2                           - JVD- none , edema- none, stasis changes- none, varices- none           Lung- + distant, wheeze + faint, cough- none , dullness-none, rub- none           Chest wall-  Abd-  Br/ Gen/ Rectal- Not done, not indicated Extrem- cyanosis- none, clubbing, none, atrophy- none, strength- nl Neuro- grossly intact to observation

## 2015-01-21 NOTE — Assessment & Plan Note (Signed)
He continues CPAP all night every night 10/[facility], medically necessary

## 2015-01-21 NOTE — Assessment & Plan Note (Signed)
Prognosis is not good. Plan-refill nebulizer solution, increase Advair strength to 500, resume theophylline as he requests, sample Stiolto inhaler to try

## 2015-01-23 DIAGNOSIS — J9601 Acute respiratory failure with hypoxia: Secondary | ICD-10-CM | POA: Diagnosis not present

## 2015-01-23 DIAGNOSIS — J449 Chronic obstructive pulmonary disease, unspecified: Secondary | ICD-10-CM | POA: Diagnosis not present

## 2015-01-23 DIAGNOSIS — C342 Malignant neoplasm of middle lobe, bronchus or lung: Secondary | ICD-10-CM | POA: Diagnosis not present

## 2015-01-25 ENCOUNTER — Encounter: Payer: Self-pay | Admitting: Internal Medicine

## 2015-01-25 ENCOUNTER — Ambulatory Visit (INDEPENDENT_AMBULATORY_CARE_PROVIDER_SITE_OTHER): Payer: Commercial Managed Care - HMO | Admitting: Internal Medicine

## 2015-01-25 VITALS — BP 168/84 | HR 85 | Temp 98.8°F | Ht 66.0 in | Wt 200.0 lb

## 2015-01-25 DIAGNOSIS — J441 Chronic obstructive pulmonary disease with (acute) exacerbation: Secondary | ICD-10-CM | POA: Diagnosis not present

## 2015-01-25 DIAGNOSIS — F418 Other specified anxiety disorders: Secondary | ICD-10-CM

## 2015-01-25 MED ORDER — NYSTATIN 100000 UNIT/ML MT SUSP: 5.0000 mL | Freq: Four times a day (QID) | OROMUCOSAL | Status: DC

## 2015-01-25 MED ORDER — ESCITALOPRAM OXALATE 20 MG PO TABS: 20.0000 mg | ORAL_TABLET | Freq: Every day | ORAL | Status: DC

## 2015-01-25 NOTE — Assessment & Plan Note (Signed)
Patient respiratory status declining despite higher dose of Advair and other changes to inhalation medication.  Patient advised to follow up with Dr. Annamaria Boots.  Consider trial of nebulized budesonide and brovana vs Advair.  Treat mild oral thrush with nystatin suspension.

## 2015-01-25 NOTE — Progress Notes (Signed)
Pre visit review using our clinic review tool, if applicable. No additional management support is needed unless otherwise documented below in the visit note. 

## 2015-01-25 NOTE — Assessment & Plan Note (Addendum)
Patient experiencing exacerbation of anxiety symptoms with upcoming surveillance CT scan of chest. Discontinue fluoxetine 40 mg. Switch to Lexapro 20 mg once daily. Patient has follow-up appointment with psychiatry in 4 weeks.  Patient advised to call office if anxiety symptoms persist or worsen.

## 2015-01-25 NOTE — Progress Notes (Signed)
Subjective:    Patient ID: Ernest [name], male    DOB: 06-Jul-1956, 59 y.o.   MRN: [id number]  HPI  59 year old white male with history of lung cancer followed by Dr. Earlie Server, COPD/chronic bronchitis, and depression and anxiety for follow up.  Patient's respiratory status has declined over the last 1-2 months. This is despite treatment with antibiotics and prednisone. Patient reports chest tightness and wheezing. He has chronic cough that is productive of whitish sputum. He denies fever or chills. He is not using home oxygen.  Patient has upcoming surveillance CT scan of chest next week.  Patient struggling with anxiety symptoms.  Current psychiatric medications reviewed.    Review of Systems    Negative for dysphagia or odynophagia, hoarseness He is anxious about upcoming repeat CT scan of chest Past Medical History  Diagnosis Date  . CAD (coronary artery disease)     Left Main 30% stenosis, LAD 20 - 30 % stenosis, first and second diagonal branchesat 40 - 50%  stenosis with small arteries, circumflex had 30% stenosis in the large obtuse marginal, RCA at 70 - 80%  stenosis [not felt to be occlusive after evaluation with flow wire], distal 50 - 60% stenosis - [name][  . COPD (chronic obstructive pulmonary disease)     Dr. Baird Lyons  . Depression   . Anxiety   . Hyperlipidemia   . Chronic insomnia   . Gout   . GERD (gastroesophageal reflux disease)   . PVD (peripheral vascular disease)     (ABI 0.9 on right and 0.89 on left)  severe left external iliac stenosis.  He  had successful stenting of his left external iliac per Dr. Burt Knack.  . DDD (degenerative disc disease)   . Hx of colonoscopy   . 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 OSA (obstructive sleep apnea)     NPSG 09/10/10- AHI 11.3/hr  . Hypertension     dr Percival Spanish  . History of radiation therapy 11/10/13- 12/29/13    left lung 6600 cGy in 33 sessions  . Cancer     lung  . Lung cancer 10/04/13    LUL  squamous cell lung cancer    History   Social History  . Marital Status: Married    Spouse Name: N/A  . Number of Children: N/A  . Years of Education: N/A   Occupational History  . Disabled welder    Social History Main Topics  . Smoking status: Former Smoker -- 0.50 packs/day for 43 years    Types: Cigarettes    Quit date: 09/23/2013  . Smokeless tobacco: Never Used     Comment: history of 3 PPD, 11/02/13   . Alcohol Use: No  . Drug Use: No  . Sexual Activity: No   Other Topics Concern  . Not on file   Social History Narrative    Past Surgical History  Procedure Laterality Date  . Spinal fusion  03/05/2007    L4-L5  . Hip surgery      left  . Arm surgery      left  . Shoulder surgery      right  . C-spine surgery    . Angioplasty    . Video bronchoscopy with endobronchial navigation N/A 10/04/2013    Procedure: VIDEO BRONCHOSCOPY WITH ENDOBRONCHIAL NAVIGATION;  Surgeon: Collene Gobble, MD;  Location: [facility] OR;  Service: Thoracic;  Laterality: N/A;  . Back surgery      Family History  Problem Relation Age  of Onset  . Heart attack Mother   . Heart attack Sister   . Lung cancer Sister   . Cancer Sister     small cell lung, mets to brain  . Emphysema Sister   . Hypertension Brother     Allergies  Allergen Reactions  . Carboplatin Shortness Of Breath, Swelling and Rash    Swelling of lips, rash on face,eyes and head    Current Outpatient Prescriptions on File Prior to Visit  Medication Sig Dispense Refill  . albuterol (PROVENTIL) (2.5 MG/3ML) 0.083% nebulizer solution INHALE 1 VIAL IN NEBULIZER EVERY 4 HOURS AS NEEDED FOR WHEEZING OR SHORTNESS OF BREATH 300 mL 11  . ALPRAZolam (XANAX) 0.25 MG tablet Take 0.25 mg by mouth daily as needed.    . ARIPiprazole (ABILIFY) 5 MG tablet Take 1 tablet (5 mg total) by mouth at bedtime.    . Armodafinil (NUVIGIL) 150 MG tablet Take 150 mg by mouth daily.    [name] atorvastatin (LIPITOR) 20 MG tablet TAKE 1 TABLET (20 MG  TOTAL) BY MOUTH DAILY. 90 tablet 3  . clonazePAM (KLONOPIN) 1 MG tablet Take 2 mg by mouth at bedtime as needed. For anxiety/insomnia    . esomeprazole (NEXIUM) 40 MG capsule Take 40 mg by mouth daily.    . fluticasone (FLONASE) 50 MCG/ACT nasal spray Place 2 sprays into both nostrils daily. 16 g 3  . Fluticasone-Salmeterol (ADVAIR DISKUS) 500-50 MCG/DOSE AEPB 1 puff then rinse mouth, twice daily maintenance 60 each 11  . gabapentin (NEURONTIN) 100 MG capsule Take 100 mg by mouth daily.     . Insulin Glargine (LANTUS SOLOSTAR) 100 UNIT/ML Solostar Pen 5 units at bedtime. 5 pen PRN  . Insulin Pen Needle (BD PEN NEEDLE NANO U/F) 32G X 4 MM MISC 1 each by Does not apply route daily. 100 each 3  . isosorbide mononitrate (IMDUR) 60 MG 24 hr tablet Take 1 tablet (60 mg total) by mouth daily. 90 tablet 3  . methocarbamol (ROBAXIN) 500 MG tablet Take 500 mg by mouth 2 (two) times daily as needed for muscle spasms.     . metoprolol succinate (TOPROL-XL) 25 MG 24 hr tablet TAKE 1/2 TABLET BY MOUTH DAILY 30 tablet 5  . mirtazapine (REMERON) 45 MG tablet Take 1 tablet (45 mg total) by mouth at bedtime.    [name] morphine (MS CONTIN) 30 MG 12 hr tablet Take 30 mg by mouth 3 (three) times daily.    . nitroGLYCERIN (NITROSTAT) 0.4 MG SL tablet Place 1 tablet (0.4 mg total) under the tongue every 5 (five) minutes as needed for chest pain. 25 tablet 3  . potassium chloride SA (K-DUR,KLOR-CON) 20 MEQ tablet Take 20 mEq by mouth daily.    . predniSONE (DELTASONE) 10 MG tablet Take 1 tablet (10 mg total) by mouth daily with breakfast. 31 tablet 3  . prochlorperazine (COMPAZINE) 10 MG tablet TAKE 1 TABLET BY MOUTH EVERY 6 HOURS AS NEEDED FOR NAUSEA AND VOMITING 60 tablet 3  . theophylline (THEO-24) 200 MG 24 hr capsule Take 200 mg by mouth 2 (two) times daily.    . theophylline (THEODUR) 200 MG 12 hr tablet Take 1 tablet (200 mg total) by mouth 2 (two) times daily. 60 tablet 11  . Tiotropium Bromide-Olodaterol (STIOLTO  RESPIMAT) 2.5-2.5 MCG/ACT AERS Inhale 2 puffs into the lungs daily. 2 Inhaler 0  . VOLTAREN 1 % GEL Apply 2 g topically daily as needed (for pain).      No current facility-administered  medications on file prior to visit.    BP 168/84 mmHg  Pulse 85  Temp(Src) 98.8 F (37.1 C) (Oral)  Ht 5\' 6"  (1.676 m)  Wt 200 lb (90.719 kg)  BMI 32.30 kg/m2    Objective:   Physical Exam  Constitutional: He is oriented to person, place, and time. He appears well-developed and well-nourished. No distress.  HENT:  Head: Normocephalic and atraumatic.  Slight white film on tongue  Neck: Neck supple.  Cardiovascular: Normal rate, regular rhythm and normal heart sounds.   No murmur heard. Pulmonary/Chest: Effort normal.  Decreased but since throughout, scattered respiratory wheezes bilaterally  Musculoskeletal: He exhibits no edema.  Lymphadenopathy:    He has no cervical adenopathy.  Neurological: He is alert and oriented to person, place, and time. No cranial nerve deficit.  Skin: Skin is warm and dry.  Psychiatric: He has a normal mood and affect. His behavior is normal.          Assessment & Plan:

## 2015-01-25 NOTE — Patient Instructions (Signed)
Follow up with Dr. Annamaria Boots as directed Follow up with your psychiatrist as directed

## 2015-01-26 ENCOUNTER — Ambulatory Visit (HOSPITAL_COMMUNITY)
Admission: RE | Admit: 2015-01-26 | Discharge: 2015-01-26 | Disposition: A | Discharge status: Home / Self Care | Payer: Commercial Managed Care - HMO | Source: Ambulatory Visit | Attending: Internal Medicine | Admitting: Internal Medicine

## 2015-01-26 ENCOUNTER — Other Ambulatory Visit (HOSPITAL_BASED_OUTPATIENT_CLINIC_OR_DEPARTMENT_OTHER): Payer: Commercial Managed Care - HMO

## 2015-01-26 ENCOUNTER — Encounter (HOSPITAL_COMMUNITY): Payer: Self-pay

## 2015-01-26 DIAGNOSIS — C3432 Malignant neoplasm of lower lobe, left bronchus or lung: Secondary | ICD-10-CM

## 2015-01-26 DIAGNOSIS — Z923 Personal history of irradiation: Secondary | ICD-10-CM | POA: Insufficient documentation

## 2015-01-26 DIAGNOSIS — C3492 Malignant neoplasm of unspecified part of left bronchus or lung: Secondary | ICD-10-CM

## 2015-01-26 LAB — COMPREHENSIVE METABOLIC PANEL (CC13)
ALT: 28 U/L (ref 0–55)
AST: 22 U/L (ref 5–34)
Albumin: 3.7 g/dL (ref 3.5–5.0)
Alkaline Phosphatase: 94 U/L (ref 40–150)
Anion Gap: 11 mEq/L (ref 3–11)
BILIRUBIN TOTAL: 0.68 mg/dL (ref 0.20–1.20)
BUN: 14.6 mg/dL (ref 7.0–26.0)
CALCIUM: 9.9 mg/dL (ref 8.4–10.4)
CO2: 33 meq/L — AB (ref 22–29)
Chloride: 98 mEq/L (ref 98–109)
Creatinine: 1 mg/dL (ref 0.7–1.3)
EGFR: 87 mL/min/{1.73_m2} — ABNORMAL LOW (ref >90)
GLUCOSE: 140 mg/dL (ref 70–140)
Potassium: 4 mEq/L (ref 3.5–5.1)
Sodium: 141 mEq/L (ref 136–145)
TOTAL PROTEIN: 6.3 g/dL — AB (ref 6.4–8.3)

## 2015-01-26 MED ORDER — IOHEXOL 300 MG/ML  SOLN
80.0000 mL | Freq: Once | INTRAMUSCULAR | Status: AC | PRN
  Administered 2015-01-26: 80 mL via INTRAVENOUS

## 2015-01-31 ENCOUNTER — Encounter: Payer: Self-pay | Admitting: Internal Medicine

## 2015-01-31 ENCOUNTER — Ambulatory Visit (HOSPITAL_BASED_OUTPATIENT_CLINIC_OR_DEPARTMENT_OTHER): Payer: Commercial Managed Care - HMO | Admitting: Internal Medicine

## 2015-01-31 ENCOUNTER — Telehealth: Payer: Self-pay | Admitting: Internal Medicine

## 2015-01-31 VITALS — BP 153/55 | HR 81 | Temp 99.0°F | Resp 18 | Ht 66.0 in | Wt 204.6 lb

## 2015-01-31 DIAGNOSIS — Z720 Tobacco use: Secondary | ICD-10-CM | POA: Diagnosis not present

## 2015-01-31 DIAGNOSIS — C3432 Malignant neoplasm of lower lobe, left bronchus or lung: Secondary | ICD-10-CM

## 2015-01-31 DIAGNOSIS — C3492 Malignant neoplasm of unspecified part of left bronchus or lung: Secondary | ICD-10-CM

## 2015-01-31 NOTE — Progress Notes (Signed)
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  PRIOR THERAPY:  1) Concurrent chemoradiation with weekly carboplatin for AUC of 2 and paclitaxel 45 mg/M2, status post 7 cycles, last dose was given 12/20/2013. First dose on 11/01/2013. 2) Consolidation chemotherapy with carboplatin for AUC of 5 and paclitaxel 175 mg/M2 every 3 weeks with Neulasta support. First cycle on 02/08/2014. Status post 3 cycles and carboplatin was discontinued secondary to allergic reaction.    CURRENT THERAPY: Observation  CHEMOTHERAPY INTENT: Curative/control  CURRENT # OF CHEMOTHERAPY CYCLES: 0  CURRENT ANTIEMETICS: Zofran, dexamethasone and Compazine  CURRENT SMOKING STATUS: Former smoker  ORAL CHEMOTHERAPY AND CONSENT: None  CURRENT BISPHOSPHONATES USE: None  PAIN MANAGEMENT: 0/10  NARCOTICS INDUCED CONSTIPATION: None.  LIVING WILL AND CODE STATUS: Full code.   INTERVAL HISTORY: Ernest [name] 59 y.o. male returns to the clinic today for three-month followup visit accompanied by his wife. The patient continues to complain of shortness of breath with exertion. Unfortunately continues to smoke a few cigarettes every now and then. He denied having any significant chest pain or hemoptysis. He denied having any nausea or vomiting, no fever or chills. He had repeat CT scan of the chest performed recently and he is here for evaluation and discussion of his scan results.  MEDICAL HISTORY: Past Medical History  Diagnosis Date  . CAD (coronary artery disease)     Left Main 30% stenosis, LAD 20 - 30 % stenosis, first and second diagonal branchesat 40 - 50%  stenosis with small arteries,  circumflex had 30% stenosis in the large obtuse marginal, RCA at 70 - 80%  stenosis [not felt to be occlusive after evaluation with flow wire], distal 50 - 60% stenosis - [name][  . COPD (chronic obstructive pulmonary disease)     Dr. Baird Lyons  . Depression   . Anxiety   . Hyperlipidemia   . Chronic insomnia   . Gout   . GERD (gastroesophageal reflux disease)   . PVD (peripheral vascular disease)     (ABI 0.9 on right and 0.89 on left)  severe left external iliac stenosis.  He  had successful stenting of his left external iliac per Dr. Burt Knack.  . DDD (degenerative disc disease)   . Hx of colonoscopy   . 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 OSA (obstructive sleep apnea)     NPSG 09/10/10- AHI 11.3/hr  . Hypertension     dr Percival Spanish  . History of radiation therapy 11/10/13- 12/29/13    left lung 6600 cGy in 33 sessions  . Cancer     lung  . Lung cancer 10/04/13    LUL squamous cell lung cancer    ALLERGIES:  is allergic to carboplatin.  MEDICATIONS:  Current Outpatient Prescriptions  Medication Sig Dispense Refill  . albuterol (PROVENTIL) (2.5 MG/3ML) 0.083% nebulizer solution INHALE 1 VIAL IN NEBULIZER EVERY 4 HOURS AS NEEDED FOR WHEEZING OR SHORTNESS OF BREATH 300 mL 11  . ALPRAZolam (XANAX) 0.25 MG tablet Take 0.25 mg by mouth daily as needed.    . ARIPiprazole (ABILIFY) 5 MG tablet Take 1 tablet (5 mg total) by mouth at bedtime.    [name] atorvastatin (LIPITOR) 20 MG tablet TAKE 1  TABLET (20 MG TOTAL) BY MOUTH DAILY. 90 tablet 3  . clonazePAM (KLONOPIN) 1 MG tablet Take 2 mg by mouth at bedtime as needed. For anxiety/insomnia    . escitalopram (LEXAPRO) 20 MG tablet Take 1 tablet (20 mg total) by mouth daily. 90 tablet 0  . esomeprazole (NEXIUM) 40 MG capsule Take 40 mg by mouth daily.    . fluticasone (FLONASE) 50 MCG/ACT nasal spray Place 2 sprays into both nostrils daily. 16 g 3  . gabapentin (NEURONTIN) 100 MG capsule Take 100 mg by mouth daily.     . Insulin Pen Needle  (BD PEN NEEDLE NANO U/F) 32G X 4 MM MISC 1 each by Does not apply route daily. 100 each 3  . isosorbide mononitrate (IMDUR) 60 MG 24 hr tablet Take 1 tablet (60 mg total) by mouth daily. 90 tablet 3  . methocarbamol (ROBAXIN) 500 MG tablet Take 500 mg by mouth 2 (two) times daily as needed for muscle spasms.     . metoprolol succinate (TOPROL-XL) 25 MG 24 hr tablet TAKE 1/2 TABLET BY MOUTH DAILY 30 tablet 5  . mirtazapine (REMERON) 45 MG tablet Take 1 tablet (45 mg total) by mouth at bedtime.    [name] morphine (MS CONTIN) 30 MG 12 hr tablet Take 30 mg by mouth 3 (three) times daily.    [name] nystatin (MYCOSTATIN) 100000 UNIT/ML suspension Take 5 mLs (500,000 Units total) by mouth 4 (four) times daily. 60 mL 1  . potassium chloride SA (K-DUR,KLOR-CON) 20 MEQ tablet Take 20 mEq by mouth daily.    . predniSONE (DELTASONE) 10 MG tablet Take 1 tablet (10 mg total) by mouth daily with breakfast. 31 tablet 3  . theophylline (THEO-24) 200 MG 24 hr capsule Take 200 mg by mouth 2 (two) times daily.    . Tiotropium Bromide-Olodaterol (STIOLTO RESPIMAT) 2.5-2.5 MCG/ACT AERS Inhale 2 puffs into the lungs daily. 2 Inhaler 0  . VOLTAREN 1 % GEL Apply 2 g topically daily as needed (for pain).     . Armodafinil (NUVIGIL) 150 MG tablet Take 150 mg by mouth daily.    . Fluticasone-Salmeterol (ADVAIR DISKUS) 500-50 MCG/DOSE AEPB 1 puff then rinse mouth, twice daily maintenance (Patient not taking: Reported on 01/31/2015) 60 each 11  . Insulin Glargine (LANTUS SOLOSTAR) 100 UNIT/ML Solostar Pen 5 units at bedtime. 5 pen PRN  . nitroGLYCERIN (NITROSTAT) 0.4 MG SL tablet Place 1 tablet (0.4 mg total) under the tongue every 5 (five) minutes as needed for chest pain. (Patient not taking: Reported on 01/31/2015) 25 tablet 3  . prochlorperazine (COMPAZINE) 10 MG tablet TAKE 1 TABLET BY MOUTH EVERY 6 HOURS AS NEEDED FOR NAUSEA AND VOMITING (Patient not taking: Reported on 01/31/2015) 60 tablet 3   No current facility-administered  medications for this visit.    SURGICAL HISTORY:  Past Surgical History  Procedure Laterality Date  . Spinal fusion  03/05/2007    L4-L5  . Hip surgery      left  . Arm surgery      left  . Shoulder surgery      right  . C-spine surgery    . Angioplasty    . Video bronchoscopy with endobronchial navigation N/A 10/04/2013    Procedure: VIDEO BRONCHOSCOPY WITH ENDOBRONCHIAL NAVIGATION;  Surgeon: Collene Gobble, MD;  Location: [facility] OR;  Service: Thoracic;  Laterality: N/A;  . Back surgery      REVIEW OF SYSTEMS:  A comprehensive review of systems was negative except for: Constitutional:  positive for fatigue Respiratory: positive for dyspnea on exertion   PHYSICAL EXAMINATION: General appearance: alert, cooperative and no distress Head: Normocephalic, without obvious abnormality, atraumatic Neck: no adenopathy, no JVD, supple, symmetrical, trachea midline and thyroid not enlarged, symmetric, no tenderness/mass/nodules Lymph nodes: Cervical, supraclavicular, and axillary nodes normal. Resp: wheezes bilaterally Back: symmetric, no curvature. ROM normal. No CVA tenderness. Cardio: regular rate and rhythm, S1, S2 normal, no murmur, click, rub or gallop GI: soft, non-tender; bowel sounds normal; no masses,  no organomegaly Extremities: extremities normal, atraumatic, no cyanosis or edema Neurologic: Alert and oriented X 3, normal strength and tone. Normal symmetric reflexes. Normal coordination and gait  ECOG PERFORMANCE STATUS: 1 - Symptomatic but completely ambulatory  Blood pressure 153/55, pulse 81, temperature 99 F (37.2 C), temperature source Oral, resp. rate 18, height 5\' 6"  (1.676 m), weight 204 lb 9.6 oz (92.806 kg), SpO2 91 %.  LABORATORY DATA: Lab Results  Component Value Date   WBC 10.6* 10/21/2014   HGB 14.0 10/21/2014   HCT 44.3 10/21/2014   MCV 89.0 10/21/2014   PLT 144 10/21/2014      Chemistry      Component Value Date/Time   NA 141 01/26/2015 0918   NA 136  07/18/2014 1558   K 4.0 01/26/2015 0918   K 4.3 07/18/2014 1558   CL 98 07/18/2014 1558   CO2 33* 01/26/2015 0918   CO2 30 07/18/2014 1558   BUN 14.6 01/26/2015 0918   BUN 10 07/18/2014 1558   CREATININE 1.0 01/26/2015 0918   CREATININE 0.8 07/18/2014 1558      Component Value Date/Time   CALCIUM 9.9 01/26/2015 0918   CALCIUM 9.2 07/18/2014 1558   ALKPHOS 94 01/26/2015 0918   ALKPHOS 125* 07/18/2014 1558   AST 22 01/26/2015 0918   AST 21 07/18/2014 1558   ALT 28 01/26/2015 0918   ALT 16 07/18/2014 1558   BILITOT 0.68 01/26/2015 0918   BILITOT 0.3 07/18/2014 1558       RADIOGRAPHIC STUDIES: Ct Chest W Contrast  01/26/2015   CLINICAL DATA:  Subsequent treatment for left lung cancer. Patient status post surgery and radiation.  EXAM: CT CHEST WITH CONTRAST  TECHNIQUE: Multidetector CT imaging of the chest was performed during intravenous contrast administration.  CONTRAST:  40mL OMNIPAQUE IOHEXOL 300 MG/ML  SOLN  COMPARISON:  CT 10/21/2014, PET-CT 10/19/2013  FINDINGS: Mediastinum/Nodes: No axillary or supraclavicular lymphadenopathy. No mediastinal hilar lymphadenopathy. No pericardial fluid. Esophagus is normal. Coronary artery calcifications noted.  Lungs/Pleura: Stable consolidation in the left upper lobe with bronchiectasis. No new or measurable nodularity. There is volume loss in the left hemi thorax unchanged from prior.  Subpleural nodule in the left lower lobe (image 42) is unchanged. Interval resolution of the consolidative focus in the medial right lower lobe compared to prior. There is minimal residual ground-glass opacity. No nodules of the right lung.  Upper abdomen: Adrenal glands are normal. There is a low-density lesion in the inferior aspect of the spleen not changed over multiple comparison exams and was not hypermetabolic on PET-CT scan. No focal hepatic lesion.  Musculoskeletal: No aggressive osseous lesion. Healing fractures of the anterior medial first and second left  ribs (image 12 and 15). The second rib fracture is new from 10/21/2014  IMPRESSION: 1. Stable posttherapy change in the left hemi thorax. 2. No evidence of lung cancer recurrence. 3. Resolution of right lower lobe consolidation. 4. Healing fractures of the left first and second rib. Differential includes traumatic fractures, pathologic  fractures or therapy related fractures.   Electronically Signed   By: Suzy Bouchard M.D.   On: 01/26/2015 10:40    ASSESSMENT AND PLAN: This is a very pleasant 59 years old white male with stage IIIA non-small cell lung cancer, squamous cell carcinoma currently undergoing concurrent chemoradiation with weekly carboplatin and paclitaxel status post 7 weeks of treatment. He tolerated his treatment fairly well with no significant adverse effects. This was followed by consolidation chemotherapy with carboplatin and paclitaxel status post 3 cycles, .carboplatin was discontinued after cycle #2 secondary to hypersensitivity reaction. The recent CT scan of the chest showed no evidence for disease progression. I discussed the scan results with the patient and his wife. I recommended for him to continue on observation with repeat CT scan of the chest in 4 months. For smoke cessation, I strongly encouraged the patient to quit smoking and offered him a smoke cessation program. The patient was also referred to the dietitian at the [facility] for evaluation and help with weight loss. He was advised to call immediately if he has any concerning symptoms in the interval. The patient voices understanding of current disease status and treatment options and is in agreement with the current care plan.  All questions were answered. The patient knows to call the clinic with any problems, questions or concerns. We can certainly see the patient much sooner if necessary.  Disclaimer: This note was dictated with voice recognition software. Similar sounding words can inadvertently be  transcribed and may not be corrected upon review.

## 2015-01-31 NOTE — Telephone Encounter (Signed)
Pt confirmed labs/ov per 03/08 POF, gave pt AVS..... KJ

## 2015-02-03 DIAGNOSIS — M961 Postlaminectomy syndrome, not elsewhere classified: Secondary | ICD-10-CM | POA: Diagnosis not present

## 2015-02-03 DIAGNOSIS — G894 Chronic pain syndrome: Secondary | ICD-10-CM | POA: Diagnosis not present

## 2015-02-03 DIAGNOSIS — M1611 Unilateral primary osteoarthritis, right hip: Secondary | ICD-10-CM | POA: Diagnosis not present

## 2015-02-03 DIAGNOSIS — M47817 Spondylosis without myelopathy or radiculopathy, lumbosacral region: Secondary | ICD-10-CM | POA: Diagnosis not present

## 2015-02-06 ENCOUNTER — Telehealth: Payer: Self-pay | Admitting: Internal Medicine

## 2015-02-06 ENCOUNTER — Ambulatory Visit: Payer: Commercial Managed Care - HMO | Admitting: Nutrition

## 2015-02-06 MED ORDER — TIOTROPIUM BROMIDE-OLODATEROL 2.5-2.5 MCG/ACT IN AERS: 2.0000 | INHALATION_SPRAY | Freq: Every day | RESPIRATORY_TRACT | Status: DC

## 2015-02-06 MED ORDER — PREDNISONE 10 MG PO TABS: 10.0000 mg | ORAL_TABLET | Freq: Every day | ORAL | Status: DC

## 2015-02-06 NOTE — Telephone Encounter (Signed)
Rx's have been sent in. Attempted to call pt's wife, Rodena Piety. She did not answer, her voicemail has not been set up. Will try back later.

## 2015-02-06 NOTE — Telephone Encounter (Signed)
Pt wife aware

## 2015-02-06 NOTE — Progress Notes (Signed)
Patient and wife present to nutrition consult.  Patient is under observation for stage IIIa lung cancer diagnosed in November 2014.  He is status post chemotherapy and radiation treatments Patient states he is here to receive information on weight loss.   Weight documented as 204.6 pounds March 8.  BMI 33.04.  Patient meets criteria for obesity.  Patient consumes one meal daily with high fat foods as snacks. (honey buns, cookies, hot dogs, etc) Patient has weight goal of 180 pounds.  Nutrition diagnosis:   Obesity related to physical activity excess calorie intake as evidenced by BMI 33.04.  Intervention: Patient educated to consume 3 healthy meals daily consisting of low fat, plant based foods. Educated patient on strategies to reduce fat and sugar in his diet. Provided fact sheets with meal pattern for patient to follow. Encouraged exercise (walking) working up to 10 minutes 3 times daily. Questions answered, teach back method used.  Monitoring, Evaluation, Goals: Patient will increase meals to 3 daily with increase in plant based foods and decrease in sugar and fat.  Next Visit:  Patient to contact me for questions or concerns.  **Disclaimer: This note was dictated with voice recognition software. Similar sounding words can inadvertently be transcribed and this note may contain transcription errors which may not have been corrected upon publication of note.**

## 2015-02-08 ENCOUNTER — Telehealth: Payer: Self-pay | Admitting: Internal Medicine

## 2015-02-08 DIAGNOSIS — J449 Chronic obstructive pulmonary disease, unspecified: Secondary | ICD-10-CM

## 2015-02-08 DIAGNOSIS — C3490 Malignant neoplasm of unspecified part of unspecified bronchus or lung: Secondary | ICD-10-CM

## 2015-02-08 DIAGNOSIS — R911 Solitary pulmonary nodule: Secondary | ICD-10-CM

## 2015-02-08 NOTE — Telephone Encounter (Signed)
Spoke with pt's wife, states that pt is trying to get a POC through [facility] with continuous flow, was told today that [facility] do not have a poc with continuous flow.  Now need to go to through a different company.   Dr. Annamaria Boots please advise on which company you would like pt to receive continuous POC through.  Thanks!

## 2015-02-09 NOTE — Telephone Encounter (Signed)
Pt wife aware of below per CY. Order placed. Pt wife aware they will hear something soon from [facility].  Call if nothing heard by next week. Nothing further needed.

## 2015-02-09 NOTE — Telephone Encounter (Signed)
Ok to change DME company - [facility] to help patient identify company that works with his insurance, that is able to provide O2 3-5L/M continuous and portable. Evaluate for Portable O2 Concentrator.  His dx- COPD with chronic bronchitis, Chronic hypoxic respiratory failure, Lung cancer

## 2015-02-10 NOTE — Telephone Encounter (Signed)
Called & spoke with pt's wife Rodena Piety & explained that d/t pt having [employer] [facility] is the only DME that accepts this ins.  Pt's wife stated she had called somewhere to get pricing on purchasing POC which they do not want to do.  She inquired about pricing for renting this & I advised that we do not have that information available.  I gave pt's wife the phone number for [facility] name.  I told pt's wife to call Maudie Mercury & talk with her to see if she could provide any rental pricing info but did stress to pt that this is also going to be expensive.  Spoke with Joellen Jersey, Dr. Janee Morn Nurse, & made her aware of this.  Will wait to see what pt & wife decide to do Kara Mead

## 2015-02-13 DIAGNOSIS — F3341 Major depressive disorder, recurrent, in partial remission: Secondary | ICD-10-CM | POA: Diagnosis not present

## 2015-02-16 DIAGNOSIS — J449 Chronic obstructive pulmonary disease, unspecified: Secondary | ICD-10-CM | POA: Diagnosis not present

## 2015-02-22 DIAGNOSIS — R069 Unspecified abnormalities of breathing: Secondary | ICD-10-CM | POA: Diagnosis not present

## 2015-02-22 DIAGNOSIS — J9601 Acute respiratory failure with hypoxia: Secondary | ICD-10-CM | POA: Diagnosis not present

## 2015-02-22 DIAGNOSIS — J449 Chronic obstructive pulmonary disease, unspecified: Secondary | ICD-10-CM | POA: Diagnosis not present

## 2015-02-22 DIAGNOSIS — C342 Malignant neoplasm of middle lobe, bronchus or lung: Secondary | ICD-10-CM | POA: Diagnosis not present

## 2015-02-24 ENCOUNTER — Telehealth: Payer: Self-pay | Admitting: Internal Medicine

## 2015-02-24 MED ORDER — THEOPHYLLINE ER 400 MG PO CP24: 400.0000 mg | ORAL_CAPSULE | Freq: Every day | ORAL | Status: DC

## 2015-02-24 NOTE — Telephone Encounter (Signed)
Fax received from [facility] - Theophylline 200mg  tablets are on back order. Per CDY, change to Theo 400mg  ER, Sig: take 1 daily, #30 x5 refills.  Called and spoke with pharmacy, [phone], made aware of Rx change.  States that she will have to order this - they will call the patient once received.   Called patient, LMTCB x 1 Will send to Katie to hold in her box until patient is aware.

## 2015-02-27 ENCOUNTER — Encounter: Payer: Self-pay | Admitting: Physician Assistant

## 2015-02-27 NOTE — Telephone Encounter (Signed)
Spoke with patient-he is aware of change in Rx. Nothing more needed at this time.

## 2015-03-01 ENCOUNTER — Encounter: Payer: Commercial Managed Care - HMO | Admitting: Gastroenterology

## 2015-03-06 DIAGNOSIS — M47817 Spondylosis without myelopathy or radiculopathy, lumbosacral region: Secondary | ICD-10-CM | POA: Diagnosis not present

## 2015-03-06 DIAGNOSIS — M961 Postlaminectomy syndrome, not elsewhere classified: Secondary | ICD-10-CM | POA: Diagnosis not present

## 2015-03-06 DIAGNOSIS — M1611 Unilateral primary osteoarthritis, right hip: Secondary | ICD-10-CM | POA: Diagnosis not present

## 2015-03-06 DIAGNOSIS — G894 Chronic pain syndrome: Secondary | ICD-10-CM | POA: Diagnosis not present

## 2015-03-19 DIAGNOSIS — J449 Chronic obstructive pulmonary disease, unspecified: Secondary | ICD-10-CM | POA: Diagnosis not present

## 2015-03-21 ENCOUNTER — Encounter: Payer: Self-pay | Admitting: Physician Assistant

## 2015-03-21 ENCOUNTER — Ambulatory Visit (INDEPENDENT_AMBULATORY_CARE_PROVIDER_SITE_OTHER): Payer: Commercial Managed Care - HMO | Admitting: Physician Assistant

## 2015-03-21 ENCOUNTER — Telehealth: Payer: Self-pay | Admitting: Internal Medicine

## 2015-03-21 VITALS — BP 130/60 | HR 72 | Ht 66.0 in | Wt 208.0 lb

## 2015-03-21 DIAGNOSIS — J449 Chronic obstructive pulmonary disease, unspecified: Secondary | ICD-10-CM

## 2015-03-21 DIAGNOSIS — Z9981 Dependence on supplemental oxygen: Secondary | ICD-10-CM | POA: Diagnosis not present

## 2015-03-21 DIAGNOSIS — Z800 Family history of malignant neoplasm of digestive organs: Secondary | ICD-10-CM | POA: Diagnosis not present

## 2015-03-21 DIAGNOSIS — Z1211 Encounter for screening for malignant neoplasm of colon: Secondary | ICD-10-CM

## 2015-03-21 MED ORDER — POLYETHYLENE GLYCOL 3350 17 GM/SCOOP PO POWD: ORAL | Status: DC

## 2015-03-21 NOTE — Telephone Encounter (Signed)
Spoke with pt's wife, states that they wish to switch to [facility].  I advised that last month when they tried to switch DME companies that [facility] was the only DME company that would accept their insurance.  Pt's wife states she still wanted to try and thought that [facility] said that [facility] would work with them.  Pt's wife is going to call [employer] to see if [facility] is in network and will call us back.

## 2015-03-21 NOTE — Progress Notes (Signed)
Patient ID: Ernest [name], male   DOB: [birth year], 59 y.o.   MRN: [id number]   Subjective:    Patient ID: Ernest [name], male    DOB: 06-Nov-1956, 59 y.o.   MRN: [id number]  HPI Ernest [name] is a pleasant 59 year old white male known remotely to Dr. Deatra [name] from prior colonoscopy. He had colonoscopy done in 2005 with finding of a 40 mm lesion at the hepatic flexure consistent with a lipoma this was not removed. He also had left colon diverticulosis no polyps. Patient comes in today after receiving a letter for recall colonoscopy. Patient relates that he does have a sister who had colon cancer.  He has no current GI complaints specifically no problems with abdominal pain and changes in bowel habits melena or hematochezia. He is oxygen dependent with history of COPD, sleep apnea, and squamous cell lung cancer diagnosed in November 2014. He has a stage III a disease and has undergone chemotherapy and radiation. He is being followed by Dr. Earlie [name] and currently is on observation ,with no evidence of progression. Other medical problems include coronary artery disease ,status post remote stent, peripheral vascular disease, hyperlipidemia, gout and depression. His pulmonologist is Dr. Annamaria [name].  Review of Systems Pertinent positive and negative review of systems were noted in the above HPI section.  All other review of systems was otherwise negative.  Outpatient Encounter Prescriptions as of 03/21/2015  Medication Sig  . albuterol (PROVENTIL) (2.5 MG/3ML) 0.083% nebulizer solution INHALE 1 VIAL IN NEBULIZER EVERY 4 HOURS AS NEEDED FOR WHEEZING OR SHORTNESS OF BREATH  . ALPRAZolam (XANAX) 0.25 MG tablet Take 0.25 mg by mouth daily as needed.  . ARIPiprazole (ABILIFY) 5 MG tablet Take 1 tablet (5 mg total) by mouth at bedtime.  . Armodafinil (NUVIGIL) 150 MG tablet Take 150 mg by mouth daily.  [name] atorvastatin (LIPITOR) 20 MG tablet TAKE 1 TABLET (20 MG TOTAL) BY MOUTH DAILY.  . clonazePAM (KLONOPIN) 1 MG  tablet Take 2 mg by mouth at bedtime as needed. For anxiety/insomnia  . escitalopram (LEXAPRO) 20 MG tablet Take 1 tablet (20 mg total) by mouth daily.  [name] esomeprazole (NEXIUM) 40 MG capsule Take 40 mg by mouth daily.  . fluticasone (FLONASE) 50 MCG/ACT nasal spray Place 2 sprays into both nostrils daily.  . Fluticasone-Salmeterol (ADVAIR DISKUS) 500-50 MCG/DOSE AEPB 1 puff then rinse mouth, twice daily maintenance  . gabapentin (NEURONTIN) 100 MG capsule Take 100 mg by mouth daily.   . Insulin Glargine (LANTUS SOLOSTAR) 100 UNIT/ML Solostar Pen 5 units at bedtime.  . Insulin Pen Needle (BD PEN NEEDLE NANO U/F) 32G X 4 MM MISC 1 each by Does not apply route daily.  . isosorbide mononitrate (IMDUR) 60 MG 24 hr tablet Take 1 tablet (60 mg total) by mouth daily.  . methocarbamol (ROBAXIN) 500 MG tablet Take 500 mg by mouth 2 (two) times daily as needed for muscle spasms.   . metoprolol succinate (TOPROL-XL) 25 MG 24 hr tablet TAKE 1/2 TABLET BY MOUTH DAILY  . mirtazapine (REMERON) 45 MG tablet Take 1 tablet (45 mg total) by mouth at bedtime.  [name] morphine (MS CONTIN) 30 MG 12 hr tablet Take 30 mg by mouth 3 (three) times daily.  . nitroGLYCERIN (NITROSTAT) 0.4 MG SL tablet Place 1 tablet (0.4 mg total) under the tongue every 5 (five) minutes as needed for chest pain.  [name] nystatin (MYCOSTATIN) 100000 UNIT/ML suspension Take 5 mLs (500,000 Units total) by mouth 4 (four) times daily.  . potassium chloride  SA (K-DUR,KLOR-CON) 20 MEQ tablet Take 20 mEq by mouth daily.  . predniSONE (DELTASONE) 10 MG tablet Take 1 tablet (10 mg total) by mouth daily with breakfast.  . prochlorperazine (COMPAZINE) 10 MG tablet TAKE 1 TABLET BY MOUTH EVERY 6 HOURS AS NEEDED FOR NAUSEA AND VOMITING  . theophylline (THEO-24) 200 MG 24 hr capsule Take 200 mg by mouth 2 (two) times daily.  . theophylline (THEO-24) 400 MG 24 hr capsule Take 1 capsule (400 mg total) by mouth daily.  . Tiotropium Bromide-Olodaterol (STIOLTO RESPIMAT)  2.5-2.5 MCG/ACT AERS Inhale 2 puffs into the lungs daily.  . VOLTAREN 1 % GEL Apply 2 g topically daily as needed (for pain).   . polyethylene glycol powder (GLYCOLAX/MIRALAX) powder Take as directed for colonoscopy.   Allergies  Allergen Reactions  . Carboplatin Shortness Of Breath, Swelling and Rash    Swelling of lips, rash on face,eyes and head   Patient Active Problem List   Diagnosis Date Noted  . Chronic respiratory failure with hypoxia 12/15/2014  . History of colonic polyps 10/26/2014  . Dysfunction of right Eustachian tube 06/20/2014  . Chronic hoarseness 06/20/2014  . COPD with exacerbation 02/15/2014  . Thrush, oral 01/30/2014  . Squamous cell lung cancer 10/07/2013  . Dental caries 04/29/2013  . Abdominal muscle strain 06/04/2012  . Hot flashes 08/14/2011  . Prostatitis 08/01/2011  . Hyperglycemia 08/01/2011  . Preventative health care 08/01/2011  . Obstructive sleep apnea 09/24/2010  . CORONARY ATHEROSCLEROSIS NATIVE CORONARY ARTERY 07/16/2010  . CHEST PAIN 06/12/2010  . Peripheral vascular disease 05/15/2010  . PURE HYPERCHOLESTEROLEMIA 05/16/2009  . TOBACCO USER 11/16/2007  . GOUT 09/08/2007  . Depression with anxiety 09/08/2007  . Essential hypertension 09/08/2007  . 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 Lung nodule, hx of 09/08/2007  . GERD 09/08/2007  . COUGH SYNCOPE 09/08/2007  . INSOMNIA, HX OF 09/08/2007   History   Social History  . Marital Status: Married    Spouse Name: N/A  . Number of Children: N/A  . Years of Education: N/A   Occupational History  . Disabled welder    Social History Main Topics  . Smoking status: Former Smoker -- 0.50 packs/day for 43 years    Types: Cigarettes    Quit date: 09/23/2013  . Smokeless tobacco: Never Used     Comment: history of 3 PPD, 11/02/13   . Alcohol Use: No  . Drug Use: No  . Sexual Activity: No   Other Topics Concern  . Not on file   Social History Narrative    Ernest [name] family history  includes Cancer in his sister; Emphysema in his sister; Heart attack in his mother and sister; Hypertension in his brother; Lung cancer in his sister.      Objective:    Filed Vitals:   03/21/15 1046  BP: 130/60  Pulse: 72    Physical Exam well-developed older white male in no acute distress, pleasant blood pressure 130/60 pulse 72 height 5 foot 6 weight 208. HEENT ;nontraumatic normocephalic EOMI PERRLA sclera anicteric, Supple; no JVD, Cardiovascular ;regular rate and rhythm with S1-S2, Pulmonary; decreased breath sounds  bilaterally and scattered rhonchi., Abdomen ;soft, nontender, nondistended, bowel sounds are active there is no palpable mass or hepatosplenomegaly, Rectal ;exam not done, Ext; no clubbing cyanosis or edema skin warm and dry, Psych; mood and affect appropriate       Assessment & Plan:   #1 59 yo male seen for colon screening/recall colon - last colonoscopy 2005-  no polyps #2 hepatic flexure lipoma #3 family hx of colon cancer -sister #4 O2 dependent COPD #5 OSA #6 Hx of squamous cell lung Ca -s/p chemo/radiation- stage  3 A  (2014) #7 CAD #8 PVD #9 Diverticulosis #10 IDDM  Plan; Pt will be scheduled for Colonoscopy with Dr Ernest [name] at [facility] because of O2 dependence Procedure discussed in detail with pt and he is agreeable to proceed. We also discussed increased risk with sedation because of his lung disease.     Linkoln Alkire S Terryon Pineiro PA-C 03/21/2015   Cc: Shawna Orleans, Doe-Hyun R, DO

## 2015-03-21 NOTE — Patient Instructions (Addendum)
You have been scheduled for a colonoscopy. Please follow written instructions given to you at your visit today.  Please pick up your prep supplies at the pharmacy within the next 1-3 days. [facility] [address] If you use inhalers (even only as needed), please bring them with you on the day of your procedure.

## 2015-03-21 NOTE — Progress Notes (Signed)
Reviewed and agree with management. Robert D. Kaplan, M.D., FACG  

## 2015-03-22 NOTE — Telephone Encounter (Signed)
Called and spoke to pt's wife. Rodena Piety stated [employer] will need to waiver from this office/ Dr. Barbee Shropshire stating the pt needs to use [employer] opposed to a covered company. Nurse will need to call [facility] with reference number and request wavier for [place].  Pt is unsatisfied with [employer] and is requesting to change to [facility].   CY please advise if you are ok with moving forward with the waiver for pt to use [employer].

## 2015-03-22 NOTE — Telephone Encounter (Signed)
States CY needs to request a wavier be sent to [place] reference [id number] for [employer] to cover, cb pt wife [phone]

## 2015-03-22 NOTE — Telephone Encounter (Signed)
Pt's wife called back. Pt's wife stated [employer] would not return a lot of their calls and when there were requests sent to [employer] they would never call to schedule a time for the reps to come out to the house, they would just show up without notification. Pt's wife said a severe lack of communication.

## 2015-03-22 NOTE — Telephone Encounter (Signed)
We need the Trolling[employer] to be as specific as they can about why they don't want to work with [employer]. Then we can consider asking [employer] for a waiver.

## 2015-03-22 NOTE — Telephone Encounter (Signed)
Called and spoke to pt's wife. [employer] stated they could provide the pt with a POC but did not bring the pt what was needed several times. Pt was waiting on a POC for 9 months before [employer] told them they do not cary the POC they need/requested. Pt is wanting to be more mobile and [employer] does cary the POC the pt is wanting.   CY please advise.

## 2015-03-23 NOTE — Telephone Encounter (Signed)
Order- Please call [employer] per phone note record, with waiver number, requesting [employer] cover [place] for DME instead of [employer], for their services including especially portable O2 concentrator. Offer [employer] the explanations from the T[employer] as to why [employer] wasn't acceptable

## 2015-03-24 NOTE — Telephone Encounter (Signed)
Attempted to contact patient to let them know that we are putting in an order for [employer] to assess for POC.  Wanted to be sure patient is okay with this order before placing it since they are not happy with [employer].  Could not leave message, no voicemail.  Will try to call back

## 2015-03-24 NOTE — Telephone Encounter (Signed)
Pt wife returning all.Ernest [name]

## 2015-03-24 NOTE — Telephone Encounter (Signed)
Received call from [place]. States that she spoke with patient wife and they are requesting a TOC (Continuous Flow Portable Concentrator) Ernest [name] is requesting an order be sent for RT to go out to patient home and re-evaluate patient for correct O2 use.  Ernest [name] states that she was advised that the patient is using 4L O2 currently.  Ernest [name] would like order for RT to do spot check on O2 to see what flow volume he needs and if he can even tolerate pulsed dosing.   Please advise Dr Annamaria Boots. Thanks.

## 2015-03-24 NOTE — Telephone Encounter (Signed)
Called [employer] (total time 20 mins) to start waiver Mcarthur Rossetti is only contracted with [employer] and is unable to use any other company at this time.  Patient will need to remain with Cristal Deer with [employer] to discuss situation Arbie Cookey will call the patient and discuss issues.   Pt wife aware that Arbie Cookey is going to call them after lunch.  Aware to call back if not heard anything by 3  Will hold in triage to follow up to ensure patient is taken care of.

## 2015-03-24 NOTE — Telephone Encounter (Signed)
Ok please order DME to assess patient for portable O2 concentrator as requested.  We are informed that his insurance is only contracted with [employer], so there is no opportunity for him to use a different DME company as he had requested.

## 2015-03-24 NOTE — Telephone Encounter (Signed)
Pt wife notified.  She is aware that [employer] is the only DME we can use due to their insurance coverage.  Order entered for Assessment for POC.  Nothing further needed.

## 2015-03-25 DIAGNOSIS — C342 Malignant neoplasm of middle lobe, bronchus or lung: Secondary | ICD-10-CM | POA: Diagnosis not present

## 2015-03-25 DIAGNOSIS — J449 Chronic obstructive pulmonary disease, unspecified: Secondary | ICD-10-CM | POA: Diagnosis not present

## 2015-03-25 DIAGNOSIS — J9601 Acute respiratory failure with hypoxia: Secondary | ICD-10-CM | POA: Diagnosis not present

## 2015-04-03 DIAGNOSIS — M47817 Spondylosis without myelopathy or radiculopathy, lumbosacral region: Secondary | ICD-10-CM | POA: Diagnosis not present

## 2015-04-03 DIAGNOSIS — G894 Chronic pain syndrome: Secondary | ICD-10-CM | POA: Diagnosis not present

## 2015-04-03 DIAGNOSIS — M1611 Unilateral primary osteoarthritis, right hip: Secondary | ICD-10-CM | POA: Diagnosis not present

## 2015-04-03 DIAGNOSIS — M961 Postlaminectomy syndrome, not elsewhere classified: Secondary | ICD-10-CM | POA: Diagnosis not present

## 2015-04-06 ENCOUNTER — Other Ambulatory Visit: Payer: Self-pay | Admitting: Internal Medicine

## 2015-04-06 NOTE — Telephone Encounter (Signed)
Allergies  Allergen Reactions  . Carboplatin Shortness Of Breath, Swelling and Rash    Swelling of lips, rash on face,eyes and head      Pt is requesting refill on Ventolin; Last end date on Ventolin was 04/26/14. Last OV was 01/19/15 and nothing is mentioned about continue Ventolin.   CY please advise.

## 2015-04-06 NOTE — Telephone Encounter (Signed)
Ok to refill for 1 year 

## 2015-04-10 DIAGNOSIS — F3341 Major depressive disorder, recurrent, in partial remission: Secondary | ICD-10-CM | POA: Diagnosis not present

## 2015-04-11 ENCOUNTER — Emergency Department (HOSPITAL_COMMUNITY): Payer: Commercial Managed Care - HMO

## 2015-04-11 ENCOUNTER — Encounter (HOSPITAL_COMMUNITY): Payer: Self-pay | Admitting: Emergency Medicine

## 2015-04-11 ENCOUNTER — Inpatient Hospital Stay (HOSPITAL_COMMUNITY)
Admission: EM | Admit: 2015-04-11 | Discharge: 2015-04-15 | DRG: 208 | Disposition: A | Discharge status: Home / Self Care | Payer: Commercial Managed Care - HMO | Attending: Internal Medicine | Admitting: Internal Medicine

## 2015-04-11 ENCOUNTER — Inpatient Hospital Stay (HOSPITAL_COMMUNITY): Payer: Commercial Managed Care - HMO

## 2015-04-11 DIAGNOSIS — J45909 Unspecified asthma, uncomplicated: Secondary | ICD-10-CM | POA: Diagnosis present

## 2015-04-11 DIAGNOSIS — G9341 Metabolic encephalopathy: Secondary | ICD-10-CM | POA: Diagnosis present

## 2015-04-11 DIAGNOSIS — E785 Hyperlipidemia, unspecified: Secondary | ICD-10-CM | POA: Diagnosis present

## 2015-04-11 DIAGNOSIS — Z9221 Personal history of antineoplastic chemotherapy: Secondary | ICD-10-CM | POA: Diagnosis not present

## 2015-04-11 DIAGNOSIS — R918 Other nonspecific abnormal finding of lung field: Secondary | ICD-10-CM | POA: Diagnosis not present

## 2015-04-11 DIAGNOSIS — R0689 Other abnormalities of breathing: Secondary | ICD-10-CM | POA: Diagnosis not present

## 2015-04-11 DIAGNOSIS — E119 Type 2 diabetes mellitus without complications: Secondary | ICD-10-CM | POA: Diagnosis present

## 2015-04-11 DIAGNOSIS — Z85118 Personal history of other malignant neoplasm of bronchus and lung: Secondary | ICD-10-CM | POA: Diagnosis not present

## 2015-04-11 DIAGNOSIS — C3491 Malignant neoplasm of unspecified part of right bronchus or lung: Secondary | ICD-10-CM | POA: Diagnosis present

## 2015-04-11 DIAGNOSIS — J9602 Acute respiratory failure with hypercapnia: Secondary | ICD-10-CM | POA: Diagnosis not present

## 2015-04-11 DIAGNOSIS — M545 Low back pain: Secondary | ICD-10-CM | POA: Diagnosis present

## 2015-04-11 DIAGNOSIS — R069 Unspecified abnormalities of breathing: Secondary | ICD-10-CM | POA: Diagnosis not present

## 2015-04-11 DIAGNOSIS — I739 Peripheral vascular disease, unspecified: Secondary | ICD-10-CM | POA: Diagnosis present

## 2015-04-11 DIAGNOSIS — J441 Chronic obstructive pulmonary disease with (acute) exacerbation: Secondary | ICD-10-CM | POA: Diagnosis not present

## 2015-04-11 DIAGNOSIS — F5104 Psychophysiologic insomnia: Secondary | ICD-10-CM | POA: Diagnosis present

## 2015-04-11 DIAGNOSIS — R0609 Other forms of dyspnea: Secondary | ICD-10-CM | POA: Diagnosis not present

## 2015-04-11 DIAGNOSIS — Z87891 Personal history of nicotine dependence: Secondary | ICD-10-CM | POA: Diagnosis not present

## 2015-04-11 DIAGNOSIS — G894 Chronic pain syndrome: Secondary | ICD-10-CM | POA: Diagnosis not present

## 2015-04-11 DIAGNOSIS — J9690 Respiratory failure, unspecified, unspecified whether with hypoxia or hypercapnia: Secondary | ICD-10-CM | POA: Diagnosis not present

## 2015-04-11 DIAGNOSIS — K219 Gastro-esophageal reflux disease without esophagitis: Secondary | ICD-10-CM | POA: Diagnosis present

## 2015-04-11 DIAGNOSIS — D696 Thrombocytopenia, unspecified: Secondary | ICD-10-CM | POA: Diagnosis not present

## 2015-04-11 DIAGNOSIS — F418 Other specified anxiety disorders: Secondary | ICD-10-CM | POA: Diagnosis present

## 2015-04-11 DIAGNOSIS — Y95 Nosocomial condition: Secondary | ICD-10-CM | POA: Diagnosis present

## 2015-04-11 DIAGNOSIS — J9611 Chronic respiratory failure with hypoxia: Secondary | ICD-10-CM | POA: Diagnosis present

## 2015-04-11 DIAGNOSIS — J188 Other pneumonia, unspecified organism: Secondary | ICD-10-CM | POA: Diagnosis not present

## 2015-04-11 DIAGNOSIS — E669 Obesity, unspecified: Secondary | ICD-10-CM | POA: Diagnosis present

## 2015-04-11 DIAGNOSIS — Z7952 Long term (current) use of systemic steroids: Secondary | ICD-10-CM | POA: Diagnosis not present

## 2015-04-11 DIAGNOSIS — J9601 Acute respiratory failure with hypoxia: Secondary | ICD-10-CM

## 2015-04-11 DIAGNOSIS — J181 Lobar pneumonia, unspecified organism: Secondary | ICD-10-CM | POA: Diagnosis not present

## 2015-04-11 DIAGNOSIS — I10 Essential (primary) hypertension: Secondary | ICD-10-CM | POA: Diagnosis present

## 2015-04-11 DIAGNOSIS — J9612 Chronic respiratory failure with hypercapnia: Secondary | ICD-10-CM | POA: Insufficient documentation

## 2015-04-11 DIAGNOSIS — G4733 Obstructive sleep apnea (adult) (pediatric): Secondary | ICD-10-CM | POA: Diagnosis present

## 2015-04-11 DIAGNOSIS — J4489 Other specified chronic obstructive pulmonary disease: Secondary | ICD-10-CM

## 2015-04-11 DIAGNOSIS — R230 Cyanosis: Secondary | ICD-10-CM | POA: Diagnosis not present

## 2015-04-11 DIAGNOSIS — J9622 Acute and chronic respiratory failure with hypercapnia: Secondary | ICD-10-CM | POA: Diagnosis not present

## 2015-04-11 DIAGNOSIS — Z9981 Dependence on supplemental oxygen: Secondary | ICD-10-CM

## 2015-04-11 DIAGNOSIS — Z515 Encounter for palliative care: Secondary | ICD-10-CM | POA: Diagnosis not present

## 2015-04-11 DIAGNOSIS — R0902 Hypoxemia: Secondary | ICD-10-CM | POA: Diagnosis not present

## 2015-04-11 DIAGNOSIS — J9621 Acute and chronic respiratory failure with hypoxia: Principal | ICD-10-CM | POA: Diagnosis present

## 2015-04-11 DIAGNOSIS — Z888 Allergy status to other drugs, medicaments and biological substances status: Secondary | ICD-10-CM | POA: Diagnosis not present

## 2015-04-11 DIAGNOSIS — D729 Disorder of white blood cells, unspecified: Secondary | ICD-10-CM | POA: Diagnosis not present

## 2015-04-11 DIAGNOSIS — I2510 Atherosclerotic heart disease of native coronary artery without angina pectoris: Secondary | ICD-10-CM | POA: Diagnosis present

## 2015-04-11 DIAGNOSIS — Z981 Arthrodesis status: Secondary | ICD-10-CM | POA: Diagnosis not present

## 2015-04-11 DIAGNOSIS — Z7982 Long term (current) use of aspirin: Secondary | ICD-10-CM

## 2015-04-11 DIAGNOSIS — Z6835 Body mass index (BMI) 35.0-35.9, adult: Secondary | ICD-10-CM

## 2015-04-11 DIAGNOSIS — Z4659 Encounter for fitting and adjustment of other gastrointestinal appliance and device: Secondary | ICD-10-CM

## 2015-04-11 DIAGNOSIS — J9620 Acute and chronic respiratory failure, unspecified whether with hypoxia or hypercapnia: Secondary | ICD-10-CM | POA: Diagnosis not present

## 2015-04-11 DIAGNOSIS — Z923 Personal history of irradiation: Secondary | ICD-10-CM

## 2015-04-11 DIAGNOSIS — M109 Gout, unspecified: Secondary | ICD-10-CM | POA: Diagnosis present

## 2015-04-11 DIAGNOSIS — Z794 Long term (current) use of insulin: Secondary | ICD-10-CM

## 2015-04-11 DIAGNOSIS — D72820 Lymphocytosis (symptomatic): Secondary | ICD-10-CM | POA: Diagnosis not present

## 2015-04-11 DIAGNOSIS — J189 Pneumonia, unspecified organism: Secondary | ICD-10-CM | POA: Diagnosis not present

## 2015-04-11 DIAGNOSIS — C3402 Malignant neoplasm of left main bronchus: Secondary | ICD-10-CM | POA: Diagnosis not present

## 2015-04-11 DIAGNOSIS — J449 Chronic obstructive pulmonary disease, unspecified: Secondary | ICD-10-CM | POA: Diagnosis present

## 2015-04-11 DIAGNOSIS — J439 Emphysema, unspecified: Secondary | ICD-10-CM | POA: Diagnosis not present

## 2015-04-11 DIAGNOSIS — R0602 Shortness of breath: Secondary | ICD-10-CM | POA: Diagnosis present

## 2015-04-11 DIAGNOSIS — J984 Other disorders of lung: Secondary | ICD-10-CM | POA: Diagnosis not present

## 2015-04-11 DIAGNOSIS — Z4682 Encounter for fitting and adjustment of non-vascular catheter: Secondary | ICD-10-CM | POA: Diagnosis not present

## 2015-04-11 LAB — BLOOD GAS, ARTERIAL
ACID-BASE EXCESS: 0.3 mmol/L (ref 0.0–2.0)
Acid-Base Excess: 0.1 mmol/L (ref 0.0–2.0)
BICARBONATE: 25.7 meq/L — AB (ref 20.0–24.0)
Bicarbonate: 28 mEq/L — ABNORMAL HIGH (ref 20.0–24.0)
Drawn by: 31276
Drawn by: 312761
FIO2: 1 %
FIO2: 0.7 %
LHR: 28 {breaths}/min
MECHVT: 500 mL
MECHVT: 550 mL
O2 SAT: 98.9 %
O2 Saturation: 99 %
PATIENT TEMPERATURE: 98.6
PCO2 ART: 79.6 mmHg — AB (ref 35.0–45.0)
PEEP: 5 cmH2O
PEEP: 5 cmH2O
Patient temperature: 98.6
RATE: 28 resp/min
TCO2: 30.4 mmol/L (ref 0–100)
TCO2: 27.4 mmol/L (ref 0–100)
pCO2 arterial: 53.8 mmHg — ABNORMAL HIGH (ref 35.0–45.0)
pH, Arterial: 7.172 — CL (ref 7.350–7.450)
pH, Arterial: 7.301 — ABNORMAL LOW (ref 7.350–7.450)
pO2, Arterial: 292 mmHg — ABNORMAL HIGH (ref 80.0–100.0)
pO2, Arterial: 124 mmHg — ABNORMAL HIGH (ref 80.0–100.0)

## 2015-04-11 LAB — TROPONIN I
TROPONIN I: 0.42 ng/mL — AB (ref <0.031)
Troponin I: 0.38 ng/mL — ABNORMAL HIGH (ref <0.031)
Troponin I: 0.45 ng/mL — ABNORMAL HIGH (ref <0.031)
Troponin I: 0.63 ng/mL (ref <0.031)

## 2015-04-11 LAB — BASIC METABOLIC PANEL
Anion gap: 13 (ref 5–15)
Anion gap: 13 (ref 5–15)
BUN: 12 mg/dL (ref 6–20)
BUN: 14 mg/dL (ref 6–20)
CALCIUM: 9.4 mg/dL (ref 8.9–10.3)
CO2: 26 mmol/L (ref 22–32)
CO2: 26 mmol/L (ref 22–32)
CREATININE: 1.13 mg/dL (ref 0.61–1.24)
Calcium: 8.8 mg/dL — ABNORMAL LOW (ref 8.9–10.3)
Chloride: 100 mmol/L — ABNORMAL LOW (ref 101–111)
Chloride: 99 mmol/L — ABNORMAL LOW (ref 101–111)
Creatinine, Ser: 1.27 mg/dL — ABNORMAL HIGH (ref 0.61–1.24)
GFR calc non Af Amer: >60 mL/min (ref >60)
GFR, EST NON AFRICAN AMERICAN: 60 mL/min — AB (ref >60)
Glucose, Bld: 255 mg/dL — ABNORMAL HIGH (ref 65–99)
Glucose, Bld: 177 mg/dL — ABNORMAL HIGH (ref 65–99)
Potassium: 4.4 mmol/L (ref 3.5–5.1)
Potassium: 3.8 mmol/L (ref 3.5–5.1)
SODIUM: 138 mmol/L (ref 135–145)
Sodium: 139 mmol/L (ref 135–145)

## 2015-04-11 LAB — PATHOLOGIST SMEAR REVIEW

## 2015-04-11 LAB — URINALYSIS, ROUTINE W REFLEX MICROSCOPIC
BILIRUBIN URINE: NEGATIVE
Glucose, UA: 100 mg/dL — AB
Ketones, ur: NEGATIVE mg/dL
Leukocytes, UA: NEGATIVE
Nitrite: NEGATIVE
Specific Gravity, Urine: 1.02 (ref 1.005–1.030)
Urobilinogen, UA: 1 mg/dL (ref 0.0–1.0)
pH: 5.5 (ref 5.0–8.0)

## 2015-04-11 LAB — BRAIN NATRIURETIC PEPTIDE: B NATRIURETIC PEPTIDE 5: 305.2 pg/mL — AB (ref 0.0–100.0)

## 2015-04-11 LAB — CBC WITH DIFFERENTIAL/PLATELET
BASOS PCT: 0 % (ref 0–1)
Basophils Absolute: 0 10*3/uL (ref 0.0–0.1)
Eosinophils Absolute: 0.8 10*3/uL — ABNORMAL HIGH (ref 0.0–0.7)
Eosinophils Relative: 3 % (ref 0–5)
HCT: 53.6 % — ABNORMAL HIGH (ref 39.0–52.0)
HEMOGLOBIN: 17 g/dL (ref 13.0–17.0)
LYMPHS PCT: 31 % (ref 12–46)
Lymphs Abs: 8.4 10*3/uL — ABNORMAL HIGH (ref 0.7–4.0)
MCH: 31 pg (ref 26.0–34.0)
MCHC: 31.7 g/dL (ref 30.0–36.0)
MCV: 97.6 fL (ref 78.0–100.0)
MONO ABS: 2.7 10*3/uL — AB (ref 0.1–1.0)
MONOS PCT: 10 % (ref 3–12)
Neutro Abs: 15.1 10*3/uL — ABNORMAL HIGH (ref 1.7–7.7)
Neutrophils Relative %: 56 % (ref 43–77)
Platelets: 167 10*3/uL (ref 150–400)
RBC: 5.49 MIL/uL (ref 4.22–5.81)
RDW: 14.3 % (ref 11.5–15.5)
WBC: 27 10*3/uL — ABNORMAL HIGH (ref 4.0–10.5)

## 2015-04-11 LAB — PHOSPHORUS: PHOSPHORUS: 5.5 mg/dL — AB (ref 2.5–4.6)

## 2015-04-11 LAB — I-STAT TROPONIN, ED: Troponin i, poc: 0.02 ng/mL (ref 0.00–0.08)

## 2015-04-11 LAB — PROCALCITONIN

## 2015-04-11 LAB — URINE MICROSCOPIC-ADD ON

## 2015-04-11 LAB — I-STAT CG4 LACTIC ACID, ED: Lactic Acid, Venous: 2.67 mmol/L (ref 0.5–2.0)

## 2015-04-11 LAB — CBC
HEMATOCRIT: 49.2 % (ref 39.0–52.0)
Hemoglobin: 16 g/dL (ref 13.0–17.0)
MCH: 30.8 pg (ref 26.0–34.0)
MCHC: 32.5 g/dL (ref 30.0–36.0)
MCV: 94.8 fL (ref 78.0–100.0)
Platelets: 125 10*3/uL — ABNORMAL LOW (ref 150–400)
RBC: 5.19 MIL/uL (ref 4.22–5.81)
RDW: 14.2 % (ref 11.5–15.5)
WBC: 21.5 10*3/uL — AB (ref 4.0–10.5)

## 2015-04-11 LAB — GLUCOSE, CAPILLARY
GLUCOSE-CAPILLARY: 209 mg/dL — AB (ref 65–99)
GLUCOSE-CAPILLARY: 190 mg/dL — AB (ref 65–99)
GLUCOSE-CAPILLARY: 158 mg/dL — AB (ref 65–99)
Glucose-Capillary: 193 mg/dL — ABNORMAL HIGH (ref 65–99)
Glucose-Capillary: 165 mg/dL — ABNORMAL HIGH (ref 65–99)
Glucose-Capillary: 142 mg/dL — ABNORMAL HIGH (ref 65–99)

## 2015-04-11 LAB — MAGNESIUM: Magnesium: 1.8 mg/dL (ref 1.7–2.4)

## 2015-04-11 LAB — MRSA PCR SCREENING: MRSA BY PCR: NEGATIVE

## 2015-04-11 LAB — TRIGLYCERIDES: Triglycerides: 417 mg/dL — ABNORMAL HIGH (ref <150)

## 2015-04-11 MED ORDER — PRO-STAT SUGAR FREE PO LIQD
30.0000 mL | Freq: Four times a day (QID) | ORAL | Status: DC
  Administered 2015-04-11 (×2): 30 mL
  Filled 2015-04-11 (×6): qty 30

## 2015-04-11 MED ORDER — PIPERACILLIN-TAZOBACTAM 3.375 G IVPB
3.3750 g | Freq: Three times a day (TID) | INTRAVENOUS | Status: DC
  Administered 2015-04-11 – 2015-04-13 (×7): 3.375 g via INTRAVENOUS
  Filled 2015-04-11 (×11): qty 50

## 2015-04-11 MED ORDER — FENTANYL BOLUS VIA INFUSION
50.0000 ug | INTRAVENOUS | Status: DC | PRN
  Administered 2015-04-11 – 2015-04-12 (×5): 50 ug via INTRAVENOUS
  Filled 2015-04-11: qty 50

## 2015-04-11 MED ORDER — FENTANYL CITRATE (PF) 100 MCG/2ML IJ SOLN: 100.0000 ug | INTRAMUSCULAR | Status: DC | PRN

## 2015-04-11 MED ORDER — METHYLPREDNISOLONE SODIUM SUCC 125 MG IJ SOLR
60.0000 mg | Freq: Four times a day (QID) | INTRAMUSCULAR | Status: DC
  Administered 2015-04-11 – 2015-04-12 (×5): 60 mg via INTRAVENOUS
  Filled 2015-04-11 (×6): qty 0.96
  Filled 2015-04-11 (×2): qty 2
  Filled 2015-04-11 (×2): qty 0.96

## 2015-04-11 MED ORDER — CHLORHEXIDINE GLUCONATE 0.12 % MT SOLN
15.0000 mL | Freq: Two times a day (BID) | OROMUCOSAL | Status: DC
  Administered 2015-04-11 – 2015-04-12 (×3): 15 mL via OROMUCOSAL
  Filled 2015-04-11 (×3): qty 15

## 2015-04-11 MED ORDER — PANTOPRAZOLE SODIUM 40 MG IV SOLR
40.0000 mg | Freq: Every day | INTRAVENOUS | Status: DC
  Administered 2015-04-11: 40 mg via INTRAVENOUS
  Filled 2015-04-11 (×3): qty 40

## 2015-04-11 MED ORDER — IPRATROPIUM-ALBUTEROL 0.5-2.5 (3) MG/3ML IN SOLN
3.0000 mL | Freq: Four times a day (QID) | RESPIRATORY_TRACT | Status: DC
  Administered 2015-04-11 – 2015-04-12 (×5): 3 mL via RESPIRATORY_TRACT
  Filled 2015-04-11 (×5): qty 3

## 2015-04-11 MED ORDER — PROPOFOL 1000 MG/100ML IV EMUL
0.0000 ug/kg/min | INTRAVENOUS | Status: DC
  Administered 2015-04-11: 15 ug/kg/min via INTRAVENOUS

## 2015-04-11 MED ORDER — ASPIRIN 325 MG PO TABS
325.0000 mg | ORAL_TABLET | Freq: Every day | ORAL | Status: DC
  Administered 2015-04-12 – 2015-04-15 (×4): 325 mg via ORAL
  Filled 2015-04-11 (×5): qty 1

## 2015-04-11 MED ORDER — ASPIRIN 300 MG RE SUPP
300.0000 mg | Freq: Every day | RECTAL | Status: DC
  Administered 2015-04-11: 300 mg via RECTAL
  Filled 2015-04-11: qty 1

## 2015-04-11 MED ORDER — SODIUM CHLORIDE 0.9 % IV SOLN
1250.0000 mg | Freq: Two times a day (BID) | INTRAVENOUS | Status: DC
  Administered 2015-04-11 – 2015-04-13 (×5): 1250 mg via INTRAVENOUS
  Filled 2015-04-11 (×7): qty 1250

## 2015-04-11 MED ORDER — SUCCINYLCHOLINE CHLORIDE 20 MG/ML IJ SOLN
INTRAMUSCULAR | Status: AC | PRN
  Administered 2015-04-11: 120 mg via INTRAVENOUS

## 2015-04-11 MED ORDER — HEPARIN SODIUM (PORCINE) 5000 UNIT/ML IJ SOLN
5000.0000 [IU] | Freq: Three times a day (TID) | INTRAMUSCULAR | Status: DC
  Administered 2015-04-11 – 2015-04-12 (×6): 5000 [IU] via SUBCUTANEOUS
  Filled 2015-04-11 (×10): qty 1

## 2015-04-11 MED ORDER — SODIUM CHLORIDE 0.9 % IV SOLN: 250.0000 mL | INTRAVENOUS | Status: DC | PRN

## 2015-04-11 MED ORDER — INSULIN ASPART 100 UNIT/ML ~~LOC~~ SOLN
0.0000 [IU] | SUBCUTANEOUS | Status: DC
Start: 2015-04-11 — End: 2015-04-12
  Administered 2015-04-11 (×3): 4 [IU] via SUBCUTANEOUS
  Administered 2015-04-11: 3 [IU] via SUBCUTANEOUS
  Administered 2015-04-12: 4 [IU] via SUBCUTANEOUS
  Administered 2015-04-12: 3 [IU] via SUBCUTANEOUS
  Administered 2015-04-12: 4 [IU] via SUBCUTANEOUS

## 2015-04-11 MED ORDER — VITAL HIGH PROTEIN PO LIQD
1000.0000 mL | ORAL | Status: DC
  Administered 2015-04-11: 16:00:00
  Administered 2015-04-12: 1000 mL
  Filled 2015-04-11 (×3): qty 1000

## 2015-04-11 MED ORDER — FUROSEMIDE 10 MG/ML IJ SOLN
40.0000 mg | Freq: Once | INTRAMUSCULAR | Status: AC
  Administered 2015-04-11: 40 mg via INTRAVENOUS
  Filled 2015-04-11: qty 4

## 2015-04-11 MED ORDER — PROPOFOL 1000 MG/100ML IV EMUL
5.0000 ug/kg/min | INTRAVENOUS | Status: DC
  Administered 2015-04-11: 20 ug/kg/min via INTRAVENOUS
  Filled 2015-04-11: qty 100

## 2015-04-11 MED ORDER — PROPOFOL 1000 MG/100ML IV EMUL
INTRAVENOUS | Status: AC
  Filled 2015-04-11: qty 100

## 2015-04-11 MED ORDER — ALBUTEROL SULFATE (2.5 MG/3ML) 0.083% IN NEBU
2.5000 mg | INHALATION_SOLUTION | RESPIRATORY_TRACT | Status: DC | PRN
  Administered 2015-04-12 – 2015-04-15 (×5): 2.5 mg via RESPIRATORY_TRACT
  Filled 2015-04-11 (×5): qty 3

## 2015-04-11 MED ORDER — FENTANYL CITRATE (PF) 100 MCG/2ML IJ SOLN: 50.0000 ug | Freq: Once | INTRAMUSCULAR | Status: DC

## 2015-04-11 MED ORDER — METOPROLOL TARTRATE 25 MG PO TABS
25.0000 mg | ORAL_TABLET | Freq: Two times a day (BID) | ORAL | Status: DC
  Administered 2015-04-11 – 2015-04-15 (×9): 25 mg
  Filled 2015-04-11 (×11): qty 1

## 2015-04-11 MED ORDER — BUDESONIDE 0.5 MG/2ML IN SUSP
0.5000 mg | Freq: Two times a day (BID) | RESPIRATORY_TRACT | Status: DC
  Administered 2015-04-11 – 2015-04-15 (×9): 0.5 mg via RESPIRATORY_TRACT
  Filled 2015-04-11 (×12): qty 2

## 2015-04-11 MED ORDER — DEXTROSE 5 % IV SOLN: 500.0000 mg | Freq: Once | INTRAVENOUS | Status: DC

## 2015-04-11 MED ORDER — ARFORMOTEROL TARTRATE 15 MCG/2ML IN NEBU
15.0000 ug | INHALATION_SOLUTION | Freq: Two times a day (BID) | RESPIRATORY_TRACT | Status: DC
  Administered 2015-04-11 – 2015-04-15 (×9): 15 ug via RESPIRATORY_TRACT
  Filled 2015-04-11 (×12): qty 2

## 2015-04-11 MED ORDER — FENTANYL CITRATE (PF) 100 MCG/2ML IJ SOLN
INTRAMUSCULAR | Status: DC
Start: 2015-04-11 — End: 2015-04-11
  Filled 2015-04-11: qty 2

## 2015-04-11 MED ORDER — ETOMIDATE 2 MG/ML IV SOLN
INTRAVENOUS | Status: AC | PRN
  Administered 2015-04-11: 20 mg via INTRAVENOUS

## 2015-04-11 MED ORDER — DEXTROSE 5 % IV SOLN
1.0000 g | Freq: Once | INTRAVENOUS | Status: DC
  Administered 2015-04-11: 1 g via INTRAVENOUS
  Filled 2015-04-11: qty 10

## 2015-04-11 MED ORDER — ALBUTEROL (5 MG/ML) CONTINUOUS INHALATION SOLN
10.0000 mg/h | INHALATION_SOLUTION | Freq: Once | RESPIRATORY_TRACT | Status: AC
  Administered 2015-04-11: 10 mg/h via RESPIRATORY_TRACT
  Filled 2015-04-11: qty 20

## 2015-04-11 MED ORDER — INSULIN ASPART 100 UNIT/ML ~~LOC~~ SOLN
0.0000 [IU] | SUBCUTANEOUS | Status: DC
  Administered 2015-04-11: 3 [IU] via SUBCUTANEOUS
  Administered 2015-04-11: 5 [IU] via SUBCUTANEOUS

## 2015-04-11 MED ORDER — FENTANYL CITRATE (PF) 100 MCG/2ML IJ SOLN
100.0000 ug | Freq: Once | INTRAMUSCULAR | Status: AC
  Administered 2015-04-11: 100 ug via INTRAVENOUS

## 2015-04-11 MED ORDER — CETYLPYRIDINIUM CHLORIDE 0.05 % MT LIQD
7.0000 mL | Freq: Four times a day (QID) | OROMUCOSAL | Status: DC
  Administered 2015-04-11 – 2015-04-12 (×4): 7 mL via OROMUCOSAL

## 2015-04-11 MED ORDER — SODIUM CHLORIDE 0.9 % IV SOLN
25.0000 ug/h | INTRAVENOUS | Status: DC
  Administered 2015-04-11: 50 ug/h via INTRAVENOUS
  Administered 2015-04-12: 150 ug/h via INTRAVENOUS
  Filled 2015-04-11 (×2): qty 50

## 2015-04-11 NOTE — ED Notes (Signed)
Per ems-- pt from home with hx COPD, emphysema and lung cancer. o2 sat 70% ems tried neb tx, cpap and pt was still cyanotic. NPA in place with BVM upon arrival to ER. cbg 200

## 2015-04-11 NOTE — Progress Notes (Signed)
Initial Nutrition Assessment  DOCUMENTATION CODES:  Obesity unspecified  INTERVENTION:  Tube feeding, Prostat  Initiate TF via OGT with Vital High Protein at 25 ml/h and Prostat 30 ml QID on day 1; on day 2, increase to goal rate of 35 ml/h (840 ml per day) to provide 1240 kcals, 134 gm protein, 702 ml free water daily.   NUTRITION DIAGNOSIS:  Inadequate oral intake related to inability to eat as evidenced by NPO status.   GOAL:  Provide needs based on ASPEN/SCCM guidelines   MONITOR:  Vent status, TF tolerance, Weight trends, Labs  REASON FOR ASSESSMENT:  Consult Enteral/tube feeding initiation and management  ASSESSMENT: Patient was brought to the ED on 5/17 with SOB. In ED required intubation for airway protection.  Patient is currently intubated on ventilator support  Temp (24hrs), Avg:99.2 F (37.3 C), Min:96.7 F (35.9 C), Max:100 F (37.8 C)  Propofol: just d/c'ed per discussion with RN. Plans not to extubate today and physician wants to start TF, RD to order/manage.   Unable to complete Nutrition-Focused physical exam at this time.   Height:  Ht Readings from Last 1 Encounters:  03/21/15 '5\' 6"'$  (1.676 m)    Weight:  Wt Readings from Last 1 Encounters:  04/11/15 205 lb 0.4 oz (93 kg)    Ideal Body Weight:  64.5 kg  Wt Readings from Last 10 Encounters:  04/11/15 205 lb 0.4 oz (93 kg)  03/21/15 208 lb (94.348 kg)  01/31/15 204 lb 9.6 oz (92.806 kg)  01/25/15 200 lb (90.719 kg)  01/19/15 203 lb (92.08 kg)  12/29/14 196 lb 12.8 oz (89.268 kg)  12/15/14 198 lb 12.8 oz (90.175 kg)  11/15/14 195 lb 9.6 oz (88.724 kg)  11/11/14 191 lb 12.8 oz (87 kg)  10/27/14 191 lb 11.2 oz (86.955 kg)    BMI:  Body mass index is 33.11 kg/(m^2).  Estimated Nutritional Needs:  Kcal:  5638-9373  Protein:  129 gm  Fluid:  1.8 L  Skin:  Reviewed, no issues  Diet Order:  Diet NPO time specified  EDUCATION NEEDS:  No education needs identified at this  time   Intake/Output Summary (Last 24 hours) at 04/11/15 1537 Last data filed at 04/11/15 1500  Gross per 24 hour  Intake 546.54 ml  Output    890 ml  Net -343.46 ml    Last BM:  5/17   Molli Barrows, RD, LDN, [facility] Pager [phone] After Hours Pager [phone]

## 2015-04-11 NOTE — Progress Notes (Addendum)
ANTIBIOTIC CONSULT NOTE - INITIAL  Pharmacy Consult for Vancomycin/Zosyn  Indication: rule out pneumonia  Allergies  Allergen Reactions  . Carboplatin Shortness Of Breath, Swelling and Rash    Swelling of lips, rash on face,eyes and head    Patient Measurements: Weight: 207 lb 14.3 oz (94.3 kg) (from 03/21/15)  Vital Signs: Temp: 99.4 F (37.4 C) (05/17 0317) Temp Source: Rectal (05/17 0317) BP: 119/77 mmHg (05/17 0335) Pulse Rate: 114 (05/17 0335)  Labs:  Recent Labs  04/11/15 0235  WBC 27.0*  HGB 17.0  PLT 167  CREATININE 1.13   Estimated Creatinine Clearance: 74.7 mL/min (by C-G formula based on Cr of 1.13).  Medical History: Past Medical History  Diagnosis Date  . CAD (coronary artery disease)     Left Main 30% stenosis, LAD 20 - 30 % stenosis, first and second diagonal branchesat 40 - 50%  stenosis with small arteries, circumflex had 30% stenosis in the large obtuse marginal, RCA at 70 - 80%  stenosis [not felt to be occlusive after evaluation with flow wire], distal 50 - 60% stenosis - [name][  . COPD (chronic obstructive pulmonary disease)     Dr. Baird Lyons  . Depression   . Anxiety   . Hyperlipidemia   . Chronic insomnia   . Gout   . GERD (gastroesophageal reflux disease)   . PVD (peripheral vascular disease)     (ABI 0.9 on right and 0.89 on left)  severe left external iliac stenosis.  He  had successful stenting of his left external iliac per Dr. Burt Knack.  . DDD (degenerative disc disease)   . Hx of colonoscopy   . 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 OSA (obstructive sleep apnea)     NPSG 09/10/10- AHI 11.3/hr  . Hypertension     dr Percival Spanish  . History of radiation therapy 11/10/13- 12/29/13    left lung 6600 cGy in 33 sessions  . Cancer     lung  . Lung cancer 10/04/13    LUL squamous cell lung cancer    Assessment: 59 y/o M with hx of COPD/lung CA here with respiratory distress requiring intubation in the ED, CXR with possibility of PNA,  WBC elevated--pt is on prednisone PTA, renal function ok, other lab as above.   Goal of Therapy:  Vancomycin trough level 15-20 mcg/ml  Plan:  -Vancomycin 1250 mg IV q12h -Zosyn 3.375G IV q8h to be infused over 4 hours -Trend WBC, temp, renal function -Drug levels as indicated   Narda Bonds 04/11/2015,3:41 AM    =============================   Addendum: - consulted to manage theophylline - SCr elevated compared to baseline - weaning to extubate   Goal of Therapy: Therapeutic range: 5-15 mcg/mL   Plan: - check theophylline level with next lab draw (sent out lab and will not return the same day) - discussed with PCCM, ok to continue to hold theophylline until lab resulted    Taryne Kiger D. Mina Marble, PharmD, BCPS Pager:  [phone] 04/11/2015, 11:17 AM

## 2015-04-11 NOTE — ED Notes (Signed)
Family at bedside with chaplain  

## 2015-04-11 NOTE — Progress Notes (Signed)
eLink Physician-Brief Progress Note Patient Name: Ernest [name] DOB: 04-15-1956 MRN: [id number]   Date of Service  04/11/2015  HPI/Events of Note  Trop of 0.38  eICU Interventions  Plan: ASA now and daily Cycle trop     Intervention Category Intermediate Interventions: Other:  DETERDING,ELIZABETH 04/11/2015, 6:29 AM

## 2015-04-11 NOTE — Progress Notes (Signed)
Utilization review completed.  

## 2015-04-11 NOTE — ED Notes (Signed)
Admitting PA at bedside.

## 2015-04-11 NOTE — Progress Notes (Signed)
CRITICAL VALUE ALERT  Critical value received:  Troponin 0.63  Date of notification:  04/11/2015  Time of notification:  15:05  Critical value read back:Yes.    Nurse who received alert:  Elliot Gurney  MD notified (1st page):  Dr. Emmit Alexanders   Time of first page:  15:08    Time MD responded: 15:08

## 2015-04-11 NOTE — Progress Notes (Signed)
eLink Physician-Brief Progress Note Patient Name: TORREN MAFFEO DOB: [birth year] MRN: [id number]   Date of Service  04/11/2015  HPI/Events of Note  Troponin I trend 0.38 --> 0.45 --> 0.63. Patient is already on ASA and prophylaxic dose Heparin . Spoke with Dr. [name] Bells who feels that this is likely "demand ischemia". EKG on admission with L axis, nonspecific repolarization changes in the lateral leads and Borderline ST elevation in anterior leads.   eICU Interventions  Will order: 1. Metoprolol 25 mg PO/OGT now and BID. 2. Repeat Troponin I in AM.     Intervention Category Intermediate Interventions: Diagnostic test evaluation  Swain Acree Eugene 04/11/2015, 3:30 PM

## 2015-04-11 NOTE — H&P (Signed)
PULMONARY / CRITICAL CARE MEDICINE   Name: Ernest [name] MRN: [id number] DOB: 1956/08/18    ADMISSION DATE:  04/11/2015 CONSULTATION DATE:  04/11/2015  REFERRING MD :  EDP  CHIEF COMPLAINT:  SOB  INITIAL PRESENTATION:  59 y.o. M brought to [facility] ED 5/17 with SOB.  In ED, was unresponsive so was intubated for airway protection.  PCCM called for admission.   STUDIES:  CXR 5/17 >>> worsening LUL consolidation in area of known neoplasm and post radiation chang. Diffuse interstitial prominence seen.  SIGNIFICANT EVENTS: 5/17 - admitted with hypercarbic respiratory failure and ? PNA   HISTORY OF PRESENT ILLNESS:  Pt is encephalopathic; therefore, this HPI is obtained from chart review. Ernest [name] is a 59 y.o. M with PMH as outlined below including COPD, OSA on CPAP, hx SCLC s/p XRT / chemo (followed by Dr. Annamaria [name]).  He presented to [facility] ED during early AM hours on 04/11/15 after he experienced SOB earlier at home.  Per his wife, he has been having SOB for the past few days, maybe 1 week.  He went to bed night prior and woke up in the middle of the night with respiratory distress and informed his wife to called ED>  On EMS arrival, pt was cyanotic with no good breath sounds and SpO2 was in the 70's.  They attempted CPAP without any improvement so proceeded to place a NPA with BVM.  O2 sats improved to 90's.  On arrival to ED, pt was fully non-responsive.  He was intubated for airway protection.  ABG post intubation revealed hypercarbic respiratory failure and CXR with ? Infiltrate in LUL.  PCCM was called for admission.  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 Julien [name].   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  has past surgical history that includes Spinal fusion (03/05/2007); Hip surgery; arm surgery; Shoulder surgery; c-spine surgery; Angioplasty; Video bronchoscopy with endobronchial navigation (N/A, 10/04/2013); and Back surgery. Prior to Admission medications   Medication Sig Start Date End Date Taking? Authorizing Provider  albuterol (PROVENTIL) (2.5 MG/3ML) 0.083% nebulizer solution INHALE 1 VIAL IN NEBULIZER EVERY 4 HOURS AS NEEDED FOR WHEEZING OR SHORTNESS OF BREATH 01/19/15   Deneise Lever, MD  ALPRAZolam Duanne Moron) 0.25 MG tablet Take 0.25 mg by mouth daily as needed. 03/10/14   Doe-Hyun R Shawna Orleans, DO  ARIPiprazole (ABILIFY) 5 MG tablet Take 1 tablet (5 mg total) by mouth at bedtime. 03/30/13   Doe-Hyun R Shawna Orleans, DO  Armodafinil (NUVIGIL) 150 MG tablet Take 150 mg by mouth daily.    Historical Provider, MD  atorvastatin (LIPITOR) 20 MG tablet TAKE 1 TABLET (20 MG TOTAL) BY MOUTH DAILY. 08/16/14   Doe-Hyun R Shawna Orleans, DO  clonazePAM (KLONOPIN) 1 MG tablet Take 2 mg by mouth at bedtime as needed. For anxiety/insomnia    Historical Provider, MD  escitalopram (LEXAPRO) 20 MG tablet Take 1 tablet (20 mg total) by mouth daily. 01/25/15   Doe-Hyun R Shawna Orleans, DO  esomeprazole (NEXIUM) 40 MG capsule Take 40 mg by mouth daily. 06/20/14   Doe-Hyun R Shawna Orleans, DO  fluticasone (FLONASE) 50  MCG/ACT nasal spray Place 2 sprays into both nostrils daily. 06/20/14 06/20/15  Doe-Hyun R Shawna Orleans, DO  Fluticasone-Salmeterol (ADVAIR DISKUS) 500-50 MCG/DOSE AEPB 1 puff then rinse mouth, twice daily maintenance 01/19/15   Deneise Lever, MD  gabapentin (NEURONTIN) 100 MG capsule Take 100 mg by mouth daily.  05/25/13   Historical Provider, MD  Insulin Glargine (LANTUS SOLOSTAR) 100 UNIT/ML Solostar Pen 5 units at bedtime. 03/18/14   Doe-Hyun R Shawna Orleans, DO   Insulin Pen Needle (BD PEN NEEDLE NANO U/F) 32G X 4 MM MISC 1 each by Does not apply route daily. 03/22/14   Doe-Hyun R Shawna Orleans, DO  isosorbide mononitrate (IMDUR) 60 MG 24 hr tablet Take 1 tablet (60 mg total) by mouth daily. 11/11/14   Minus Breeding, MD  methocarbamol (ROBAXIN) 500 MG tablet Take 500 mg by mouth 2 (two) times daily as needed for muscle spasms.  10/27/13   Historical Provider, MD  metoprolol succinate (TOPROL-XL) 25 MG 24 hr tablet TAKE 1/2 TABLET BY MOUTH DAILY 08/09/14   Minus Breeding, MD  mirtazapine (REMERON) 45 MG tablet Take 1 tablet (45 mg total) by mouth at bedtime. 12/29/14   Tammy S Parrett, NP  morphine (MS CONTIN) 30 MG 12 hr tablet Take 30 mg by mouth 3 (three) times daily.    Historical Provider, MD  nitroGLYCERIN (NITROSTAT) 0.4 MG SL tablet Place 1 tablet (0.4 mg total) under the tongue every 5 (five) minutes as needed for chest pain. 08/09/14   Minus Breeding, MD  nystatin (MYCOSTATIN) 100000 UNIT/ML suspension Take 5 mLs (500,000 Units total) by mouth 4 (four) times daily. 01/25/15   Doe-Hyun R Shawna Orleans, DO  polyethylene glycol powder (GLYCOLAX/MIRALAX) powder Take as directed for colonoscopy. 03/21/15   Amy S Esterwood, PA-C  potassium chloride SA (K-DUR,KLOR-CON) 20 MEQ tablet Take 20 mEq by mouth daily.    Historical Provider, MD  predniSONE (DELTASONE) 10 MG tablet Take 1 tablet (10 mg total) by mouth daily with breakfast. 02/06/15   Deneise Lever, MD  prochlorperazine (COMPAZINE) 10 MG tablet TAKE 1 TABLET BY MOUTH EVERY 6 HOURS AS NEEDED FOR NAUSEA AND VOMITING 07/28/14   Curt Bears, MD  theophylline (THEO-24) 200 MG 24 hr capsule Take 200 mg by mouth 2 (two) times daily.    Historical Provider, MD  theophylline (THEO-24) 400 MG 24 hr capsule Take 1 capsule (400 mg total) by mouth daily. 02/24/15   Deneise Lever, MD  Tiotropium Bromide-Olodaterol (STIOLTO RESPIMAT) 2.5-2.5 MCG/ACT AERS Inhale 2 puffs into the lungs daily. 02/06/15   Deneise Lever, MD  VENTOLIN HFA 108  (90 BASE) MCG/ACT inhaler INHALE 2 PUFFS INTO THE LUNGS 4 TIMES DAILY AS NEEDED FOR WHEEZING 04/07/15   Deneise Lever, MD  VOLTAREN 1 % GEL Apply 2 g topically daily as needed (for pain).  06/24/12   Historical Provider, MD   Allergies  Allergen Reactions  . Carboplatin Shortness Of Breath, Swelling and Rash    Swelling of lips, rash on face,eyes and head    FAMILY HISTORY:  Family History  Problem Relation Age of Onset  . Heart attack Mother   . Heart attack Sister   . Lung cancer Sister   . Cancer Sister     small cell lung, mets to brain  . Emphysema Sister   . Hypertension Brother     SOCIAL HISTORY:  reports that he quit smoking about 18 months ago. His smoking use included Cigarettes. He has a 21.5 pack-year smoking history. He  has never used smokeless tobacco. He reports that he does not drink alcohol or use illicit drugs.  REVIEW OF SYSTEMS:  Unable to obtain as pt is encephalopathic.  SUBJECTIVE:   VITAL SIGNS: Temp:  [96.7 F (35.9 C)-99.4 F (37.4 C)] 99.4 F (37.4 C) (05/17 0317) Pulse Rate:  [91-103] 103 (05/17 0300) Resp:  [20-25] 25 (05/17 0300) BP: (148-192)/(93-101) 148/94 mmHg (05/17 0300) SpO2:  [94 %-99 %] 96 % (05/17 0300) FiO2 (%):  [60 %] 60 % (05/17 0305) Weight:  [94.3 kg (207 lb 14.3 oz)] 94.3 kg (207 lb 14.3 oz) (05/17 0248) HEMODYNAMICS:   VENTILATOR SETTINGS: Vent Mode:  [-] PRVC FiO2 (%):  [60 %] 60 % Set Rate:  [28 bmp] 28 bmp Vt Set:  [500 mL] 500 mL PEEP:  [5 cmH20] 5 cmH20 Plateau Pressure:  [25 cmH20] 25 cmH20 INTAKE / OUTPUT: Intake/Output    None     PHYSICAL EXAMINATION: General: WDWN male, in NAD. Neuro: Sedated on vent. HEENT: [place]/AT. PERRL, sclerae anicteric. Cardiovascular: RRR, no M/R/G.  Lungs: Respirations shallow and labored.  Diffuse expiratory wheezes. Abdomen: BS x 4, soft, NT/ND.  Musculoskeletal: No gross deformities, no edema.  Skin: Intact, warm, no rashes.  LABS:  CBC  Recent Labs Lab  04/11/15 0235  WBC 27.0*  HGB 17.0  HCT 53.6*  PLT 167   Coag's No results for input(s): APTT, INR in the last 168 hours. BMET  Recent Labs Lab 04/11/15 0235  NA 139  K 4.4  CL 100*  CO2 26  BUN 12  CREATININE 1.13  GLUCOSE 255*   Electrolytes  Recent Labs Lab 04/11/15 0235  CALCIUM 9.4   Sepsis Markers  Recent Labs Lab 04/11/15 0311  LATICACIDVEN 2.67*   ABG No results for input(s): PHART, PCO2ART, PO2ART in the last 168 hours. Liver Enzymes No results for input(s): AST, ALT, ALKPHOS, BILITOT, ALBUMIN in the last 168 hours. Cardiac Enzymes No results for input(s): TROPONINI, PROBNP in the last 168 hours. Glucose No results for input(s): GLUCAP in the last 168 hours.  Imaging Dg Chest Portable 1 View  04/11/2015   CLINICAL DATA:  Respiratory distress, intubation orogastric tube placement. History of lung cancer.  EXAM: PORTABLE CHEST - 1 VIEW  COMPARISON:  Chest radiograph December 15, 2014 and CT of the chest February 22, 2014  FINDINGS: Endotracheal tube tip projects 5.3 cm above the carina. Nasogastric tube looped in proximal stomach region, distal tip not imaged. Worsening consolidation LEFT upper lobe with diffuse interstitial prominence, increased from prior examination. Kerley B-lines RIGHT lung base. No pleural effusion. No pneumothorax.  ACDF. Soft tissue planes and included osseous structure nonsuspicious.  IMPRESSION: Worsening LEFT upper lobe consolidation in area of known neoplasm and post radiation change. Diffuse interstitial prominence, which can be seen with atypical infection or pulmonary edema. Recommend short-term follow-up.  Endotracheal tube tip projects 5.3 cm above the carina. Nasogastric tube looped in proximal stomach region, distal tip not imaged.   Electronically Signed   By: Elon Alas   On: 04/11/2015 02:59    ASSESSMENT / PLAN:  PULMONARY OETT 5/17 >>> A: Acute on chronic hypercarbic and hypoxemic respiratory failure COPD with  exacerbation - PFTs from 2014 with FEV1 44%, ratio 66% ? HCAP Pulmonary edema - ? CXR findings + frothy sputum in ETT OSA on CPAP Hx stage IIIA NSCLC - s/p XRT / chemo  P:   Full mechanical support, wean as able Decrease MV VAP bundle SBT when able. DuoNebs /  Albuterol. Solumedrol. Abx per ID section. Lasix '40mg'$  x 1. ABG and CXR in AM. Follow up with Dr. Julien [name] as outpatient, repeat CT scan in July sometime (4 months from last visit in March). Hold outpatient advair, theophylline, stiolto, ventolin.  CARDIOVASCULAR A:  Hx HTN, CAD, HLD, PVD P:  Monitor hemodynamics Repeat troponin Hold outpatient atorvastatin, imdur, toprol Daily asa  RENAL A:   No acute issues P:  BMP in AM.  GASTROINTESTINAL A:   GERD Nutrition P:   SUP: Pantoprazole. NPO. TF if remains NPO > 24 hours.  HEMATOLOGIC / ONCOLOGIC A:   Hx stage IIIA NSCLC - s/p XRT / chemo VTE Prophylaxis P:  Follow up with Dr. Julien [name] as outpatient, repeat CT scan in July sometime (4 months from last visit in March). SCD's / Heparin. CBC in AM.  INFECTIOUS A:   ? HCAP P:   BCx2 5/17 > Sputum Cx 5/17 > Abx: Vanc, start date 5/17, day 1/x. Abx: Zosyn, start date 5/17, day 1/x. Check PCT, if negative then consider deescalating abx to cover for AECOPD only.  ENDOCRINE A:   DM   P:   CBG's q4hr. SSI. Hold outpatient lantus.  NEUROLOGIC A:   Acute metabolic / hypercarbic encephalopathy Hx depression / anxiety P:   Sedation:  Propofol gtt / Fentanyl PRN. RASS goal: 0 to -1. Daily WUA. Hold outpatient alprazolam, aripiprazole, armodafinil, clonzepam, escitalopram, gabapentin, methocarbamol, mirtazapine, morphine.  Family updated: Wife at bedside.  Interdisciplinary Family Meeting v Palliative Care Meeting:  Due by: 5/23.   Montey Hora, [facility] Pager: [phone]  or [phone] 04/11/2015, 3:19 AM   Attending:  I have seen and  examined the patient with nurse practitioner/resident and agree with the note above.   Mr. Cedar wife describes one week of increasing dyspnea, cough productive of sputum and wheezing.  No fevers, chest pain.  He was intubated overnight for hypercarbic respiratory failure.  He has persistent wheezing on exam this morning, prolonged expiratory phase.  2009 PFT with only moderate airflow obstruction but significant wheezing on exam, given chronic prednisone suspect he has severe COPD.  CXR images personally reviewed> LUL infiltrate  Impression/plan: 1) Acute hypercapnic respiratory failure due to AE COPD and HCAP > vanc / zosyn, f/u cultures, decrease RR on vent, SBT as tolerated, consider NAVA given poor triggering and air trapping, add brovana/pulmicort, continue solumedrol, check theophylline level prior to starting back 2) Lung cancer > given LUL infiltrate, needs repeat CT this admission 3) Acute encephalopathy> resolved, due to hypercarbia, hold theophylline and check level  Rest as above  I updated his wife at bedside  My cc time 60 minutes  Roselie Awkward, MD [place] [facility] Pager: [phone] Cell: [phone] If no response, call [phone]

## 2015-04-11 NOTE — ED Provider Notes (Signed)
INTUBATION Date/Time: 04/11/2015 2:44 AM Performed by: Antony Blackbird Authorized by: Ripley Fraise Consent: The procedure was performed in an emergent situation. Patient identity confirmed: anonymous protocol, patient vented/unresponsive Time out: Immediately prior to procedure a "time out" was called to verify the correct patient, procedure, equipment, support staff and site/side marked as required. Indications: airway protection,  respiratory failure and  respiratory distress Intubation method: video-assisted Patient status: paralyzed (RSI) Preoxygenation: BVM Sedatives: etomidate Paralytic: succinylcholine Laryngoscope size: Mac 3 Tube size: 7.5 mm Tube type: cuffed Number of attempts: 1 Cricoid pressure: yes Cords visualized: yes Post-procedure assessment: chest rise,  ETCO2 monitor and CO2 detector Breath sounds: equal Cuff inflated: yes ETT to lip: 23 cm Tube secured with: ETT holder Chest x-ray interpreted by me, other physician and radiologist. Chest x-ray findings: endotracheal tube in appropriate position Patient tolerance: Patient tolerated the procedure well with no immediate complications   Pt tolerated Procedure without immediate complications.    Antony Blackbird, MD 04/11/15 [id number]  Ripley Fraise, MD 04/11/15 [phone]

## 2015-04-11 NOTE — ED Provider Notes (Signed)
CSN: [id number]     Arrival date & time 04/11/15  0227 History  This chart was scribed for Ripley Fraise, MD by Rayfield Citizen, ED Scribe. This patient was seen in room [facility] and the patient's care was started at 2:28 AM.  Level 5 Caveat; Patient Unresponsive    CHIEF COMPLAINT - SHORTNESS OF BREATH  The history is provided by the EMS personnel. No language interpreter was used.     HPI Comments: Ernest [name] is a 59 y.o. male with past medical history of COPD with asthma, lung cancer, DM, CAD, HTN, HLD brought in by ambulance, who presents to the Emergency Department in respiratory distress. Per EMS, patient went to bed well and woke in the middle of the night with difficulty breathing. On EMS arrival, patient was cyanotic with no audible breath sounds, O2 sats in the 70s. EMS attempted nebulizer treatments and CPAP without improvement; proceeded to place NPA and bag with O2 sat improvement to the 90s. BP in field 190/100, CBG 200, BVM on arrival.   Past Medical History  Diagnosis Date  . CAD (coronary artery disease)     Left Main 30% stenosis, LAD 20 - 30 % stenosis, first and second diagonal branchesat 40 - 50%  stenosis with small arteries, circumflex had 30% stenosis in the large obtuse marginal, RCA at 70 - 80%  stenosis [not felt to be occlusive after evaluation with flow wire], distal 50 - 60% stenosis - [name][  . COPD (chronic obstructive pulmonary disease)     Dr. Baird Lyons  . Depression   . Anxiety   . Hyperlipidemia   . Chronic insomnia   . Gout   . GERD (gastroesophageal reflux disease)   . PVD (peripheral vascular disease)     (ABI 0.9 on right and 0.89 on left)  severe left external iliac stenosis.  He  had successful stenting of his left external iliac per Dr. Burt Knack.  . DDD (degenerative disc disease)   . Hx of colonoscopy   . 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 OSA (obstructive sleep apnea)     NPSG 09/10/10- AHI 11.3/hr  . Hypertension     dr  Percival Spanish  . History of radiation therapy 11/10/13- 12/29/13    left lung 6600 cGy in 33 sessions  . Cancer     lung  . Lung cancer 10/04/13    LUL squamous cell lung cancer   Past Surgical History  Procedure Laterality Date  . Spinal fusion  03/05/2007    L4-L5  . Hip surgery      left  . Arm surgery      left  . Shoulder surgery      right  . C-spine surgery    . Angioplasty    . Video bronchoscopy with endobronchial navigation N/A 10/04/2013    Procedure: VIDEO BRONCHOSCOPY WITH ENDOBRONCHIAL NAVIGATION;  Surgeon: Collene Gobble, MD;  Location: [facility] OR;  Service: Thoracic;  Laterality: N/A;  . Back surgery     Family History  Problem Relation Age of Onset  . Heart attack Mother   . Heart attack Sister   . Lung cancer Sister   . Cancer Sister     small cell lung, mets to brain  . Emphysema Sister   . Hypertension Brother    History  Substance Use Topics  . Smoking status: Former Smoker -- 0.50 packs/day for 43 years    Types: Cigarettes    Quit date: 09/23/2013  . Smokeless  tobacco: Never Used     Comment: history of 3 PPD, 11/02/13   . Alcohol Use: No    Review of Systems  Unable to perform ROS: Patient unresponsive      Allergies  Carboplatin  Home Medications   Prior to Admission medications   Medication Sig Start Date End Date Taking? Authorizing Provider  albuterol (PROVENTIL) (2.5 MG/3ML) 0.083% nebulizer solution INHALE 1 VIAL IN NEBULIZER EVERY 4 HOURS AS NEEDED FOR WHEEZING OR SHORTNESS OF BREATH 01/19/15   Deneise Lever, MD  ALPRAZolam Duanne Moron) 0.25 MG tablet Take 0.25 mg by mouth daily as needed. 03/10/14   Doe-Hyun R Shawna Orleans, DO  ARIPiprazole (ABILIFY) 5 MG tablet Take 1 tablet (5 mg total) by mouth at bedtime. 03/30/13   Doe-Hyun R Shawna Orleans, DO  Armodafinil (NUVIGIL) 150 MG tablet Take 150 mg by mouth daily.    Historical Provider, MD  atorvastatin (LIPITOR) 20 MG tablet TAKE 1 TABLET (20 MG TOTAL) BY MOUTH DAILY. 08/16/14   Doe-Hyun R Shawna Orleans, DO  clonazePAM  (KLONOPIN) 1 MG tablet Take 2 mg by mouth at bedtime as needed. For anxiety/insomnia    Historical Provider, MD  escitalopram (LEXAPRO) 20 MG tablet Take 1 tablet (20 mg total) by mouth daily. 01/25/15   Doe-Hyun R Shawna Orleans, DO  esomeprazole (NEXIUM) 40 MG capsule Take 40 mg by mouth daily. 06/20/14   Doe-Hyun R Shawna Orleans, DO  fluticasone (FLONASE) 50 MCG/ACT nasal spray Place 2 sprays into both nostrils daily. 06/20/14 06/20/15  Doe-Hyun R Shawna Orleans, DO  Fluticasone-Salmeterol (ADVAIR DISKUS) 500-50 MCG/DOSE AEPB 1 puff then rinse mouth, twice daily maintenance 01/19/15   Deneise Lever, MD  gabapentin (NEURONTIN) 100 MG capsule Take 100 mg by mouth daily.  05/25/13   Historical Provider, MD  Insulin Glargine (LANTUS SOLOSTAR) 100 UNIT/ML Solostar Pen 5 units at bedtime. 03/18/14   Doe-Hyun R Shawna Orleans, DO  Insulin Pen Needle (BD PEN NEEDLE NANO U/F) 32G X 4 MM MISC 1 each by Does not apply route daily. 03/22/14   Doe-Hyun R Shawna Orleans, DO  isosorbide mononitrate (IMDUR) 60 MG 24 hr tablet Take 1 tablet (60 mg total) by mouth daily. 11/11/14   Minus Breeding, MD  methocarbamol (ROBAXIN) 500 MG tablet Take 500 mg by mouth 2 (two) times daily as needed for muscle spasms.  10/27/13   Historical Provider, MD  metoprolol succinate (TOPROL-XL) 25 MG 24 hr tablet TAKE 1/2 TABLET BY MOUTH DAILY 08/09/14   Minus Breeding, MD  mirtazapine (REMERON) 45 MG tablet Take 1 tablet (45 mg total) by mouth at bedtime. 12/29/14   Tammy S Parrett, NP  morphine (MS CONTIN) 30 MG 12 hr tablet Take 30 mg by mouth 3 (three) times daily.    Historical Provider, MD  nitroGLYCERIN (NITROSTAT) 0.4 MG SL tablet Place 1 tablet (0.4 mg total) under the tongue every 5 (five) minutes as needed for chest pain. 08/09/14   Minus Breeding, MD  nystatin (MYCOSTATIN) 100000 UNIT/ML suspension Take 5 mLs (500,000 Units total) by mouth 4 (four) times daily. 01/25/15   Doe-Hyun R Shawna Orleans, DO  polyethylene glycol powder (GLYCOLAX/MIRALAX) powder Take as directed for colonoscopy. 03/21/15   Amy  S Esterwood, PA-C  potassium chloride SA (K-DUR,KLOR-CON) 20 MEQ tablet Take 20 mEq by mouth daily.    Historical Provider, MD  predniSONE (DELTASONE) 10 MG tablet Take 1 tablet (10 mg total) by mouth daily with breakfast. 02/06/15   Deneise Lever, MD  prochlorperazine (COMPAZINE) 10 MG tablet TAKE 1 TABLET BY MOUTH  EVERY 6 HOURS AS NEEDED FOR NAUSEA AND VOMITING 07/28/14   Curt Bears, MD  theophylline (THEO-24) 200 MG 24 hr capsule Take 200 mg by mouth 2 (two) times daily.    Historical Provider, MD  theophylline (THEO-24) 400 MG 24 hr capsule Take 1 capsule (400 mg total) by mouth daily. 02/24/15   Deneise Lever, MD  Tiotropium Bromide-Olodaterol (STIOLTO RESPIMAT) 2.5-2.5 MCG/ACT AERS Inhale 2 puffs into the lungs daily. 02/06/15   Deneise Lever, MD  VENTOLIN HFA 108 (90 BASE) MCG/ACT inhaler INHALE 2 PUFFS INTO THE LUNGS 4 TIMES DAILY AS NEEDED FOR WHEEZING 04/07/15   Deneise Lever, MD  VOLTAREN 1 % GEL Apply 2 g topically daily as needed (for pain).  06/24/12   Historical Provider, MD   BP 191/101 mmHg  Pulse 98  Resp 20  Wt 207 lb 14.3 oz (94.3 kg)  SpO2 99% Physical Exam   CONSTITUTIONAL: patient ill-appearing, in respiratory distress HEAD: Normocephalic/atraumatic EYES: PERRL ENMT: Mucous membranes moist NECK: supple no meningeal signs CV: S1/S2 noted, no murmurs/rubs/gallops noted LUNGS: Distress noted; decreased breath sounds bilaterally with bag valve mask ABDOMEN: soft, bowel sounds noted throughout abdomen GU - normal appearance, no signs of trauma, chaperone present NEURO: Patient is unresponsive EXTREMITIES: pulses normal/equalx4, full ROM SKIN: warm, color normal PSYCH: Unable to assess  ED Course  Procedures  CRITICAL CARE Performed by: Sharyon Cable Total critical care time: 33 Critical care time was exclusive of separately billable procedures and treating other patients. Critical care was necessary to treat or prevent imminent or life-threatening  deterioration. Critical care was time spent personally by me on the following activities: development of treatment plan with patient and/or surrogate as well as nursing, discussions with consultants, evaluation of patient's response to treatment, examination of patient, obtaining history from patient or surrogate, ordering and performing treatments and interventions, ordering and review of laboratory studies, ordering and review of radiographic studies, pulse oximetry and re-evaluation of patient's condition. Pt with respiratory failure requiring immediate intervention on arrival    PATIENT INTUBATED BY RESIDENT I WAS PRESENT FOR ENTIRE PROCEDURE  3:08 AM Pt stabilized on ventilator He is noted to have hypercapneic resp failure Also noted to have likely pneumonia Critical care at bedside for admission D/w wife as well and updated on critical nature of illness  Labs Review Labs Reviewed  CBC WITH DIFFERENTIAL/PLATELET - Abnormal; Notable for the following:    WBC 27.0 (*)    HCT 53.6 (*)    All other components within normal limits  CULTURE, BLOOD (ROUTINE X 2)  CULTURE, BLOOD (ROUTINE X 2)  BASIC METABOLIC PANEL  BRAIN NATRIURETIC PEPTIDE  I-STAT TROPOININ, ED  I-STAT CG4 LACTIC ACID, ED    Imaging Review Dg Chest Portable 1 View  04/11/2015   CLINICAL DATA:  Respiratory distress, intubation orogastric tube placement. History of lung cancer.  EXAM: PORTABLE CHEST - 1 VIEW  COMPARISON:  Chest radiograph December 15, 2014 and CT of the chest February 22, 2014  FINDINGS: Endotracheal tube tip projects 5.3 cm above the carina. Nasogastric tube looped in proximal stomach region, distal tip not imaged. Worsening consolidation LEFT upper lobe with diffuse interstitial prominence, increased from prior examination. Kerley B-lines RIGHT lung base. No pleural effusion. No pneumothorax.  ACDF. Soft tissue planes and included osseous structure nonsuspicious.  IMPRESSION: Worsening LEFT upper lobe  consolidation in area of known neoplasm and post radiation change. Diffuse interstitial prominence, which can be seen with atypical infection or pulmonary edema.  Recommend short-term follow-up.  Endotracheal tube tip projects 5.3 cm above the carina. Nasogastric tube looped in proximal stomach region, distal tip not imaged.   Electronically Signed   By: Elon Alas   On: 04/11/2015 02:59     EKG Interpretation   Date/Time:  Tuesday Apr 11 2015 02:30:13 EDT Ventricular Rate:  91 PR Interval:  170 QRS Duration: 104 QT Interval:  332 QTC Calculation: 408 R Axis:   -81 Text Interpretation:  Sinus rhythm Left axis deviation Nonspecific repol  abnormality, lateral leads Borderline ST elevation, anterior leads  Abnormal ekg No significant change since last tracing Confirmed by  Christy Gentles  MD, Elenore Rota ([id number]) on 04/11/2015 2:48:45 AM     Medications  propofol (DIPRIVAN) 1000 MG/100ML infusion (20 mcg/kg/min  94.3 kg Intravenous Rate/Dose Change 04/11/15 0309)  propofol (DIPRIVAN) 1000 MG/100ML infusion (not administered)  cefTRIAXone (ROCEPHIN) 1 g in dextrose 5 % 50 mL IVPB (not administered)  azithromycin (ZITHROMAX) 500 mg in dextrose 5 % 250 mL IVPB (not administered)  etomidate (AMIDATE) injection (20 mg Intravenous Given 04/11/15 0234)  succinylcholine (ANECTINE) injection (120 mg Intravenous Given 04/11/15 0235)  albuterol (PROVENTIL,VENTOLIN) solution continuous neb (10 mg/hr Nebulization Given 04/11/15 0303)    MDM   Final diagnoses:  Acute respiratory failure with hypercapnia  CAP (community acquired pneumonia)    Nursing notes including past medical history and social history reviewed and considered in documentation Labs/vital reviewed myself and considered during evaluation xrays/imaging reviewed by myself and considered during evaluation   I personally performed the services described in this documentation, which was scribed in my presence. The recorded information has  been reviewed and is accurate.       Ripley Fraise, MD 04/11/15 [phone]

## 2015-04-11 NOTE — Progress Notes (Signed)
Chaplain arrived to escort wife to consultation room, where she met with MD.  Soon she was allowed into pt. Room, where she has been at bedside since. Comfort measures provided.  Chaplain will follow as needed.  Rev. [place], [place]

## 2015-04-11 NOTE — Care Management Note (Signed)
Ur ins review done.lives w wife, pcp dr Shawna Orleans.

## 2015-04-11 NOTE — Progress Notes (Signed)
CRITICAL VALUE ALERT  Critical value received:  Troponin: 0.38  Date of notification:  04/11/15  Time of notification:  0625    Critical value read back:Yes.    Nurse who received alert:  Corinda Gubler   MD notified (1st page):  Warren Lacy MD  Time of first page:  [phone]  Responding MD:  Dr. Jimmy Footman  Time MD responded:  [phone]

## 2015-04-12 ENCOUNTER — Inpatient Hospital Stay (HOSPITAL_COMMUNITY): Payer: Commercial Managed Care - HMO

## 2015-04-12 DIAGNOSIS — J441 Chronic obstructive pulmonary disease with (acute) exacerbation: Secondary | ICD-10-CM

## 2015-04-12 DIAGNOSIS — I248 Other forms of acute ischemic heart disease: Secondary | ICD-10-CM

## 2015-04-12 DIAGNOSIS — J9601 Acute respiratory failure with hypoxia: Secondary | ICD-10-CM

## 2015-04-12 LAB — BASIC METABOLIC PANEL
Anion gap: 7 (ref 5–15)
BUN: 30 mg/dL — ABNORMAL HIGH (ref 6–20)
CALCIUM: 9 mg/dL (ref 8.9–10.3)
CO2: 31 mmol/L (ref 22–32)
Chloride: 100 mmol/L — ABNORMAL LOW (ref 101–111)
Creatinine, Ser: 1.19 mg/dL (ref 0.61–1.24)
Glucose, Bld: 172 mg/dL — ABNORMAL HIGH (ref 65–99)
Potassium: 4.4 mmol/L (ref 3.5–5.1)
SODIUM: 138 mmol/L (ref 135–145)

## 2015-04-12 LAB — TROPONIN I: TROPONIN I: 0.25 ng/mL — AB (ref <0.031)

## 2015-04-12 LAB — GLUCOSE, CAPILLARY
GLUCOSE-CAPILLARY: 159 mg/dL — AB (ref 65–99)
Glucose-Capillary: 156 mg/dL — ABNORMAL HIGH (ref 65–99)
Glucose-Capillary: 176 mg/dL — ABNORMAL HIGH (ref 65–99)
Glucose-Capillary: 132 mg/dL — ABNORMAL HIGH (ref 65–99)
Glucose-Capillary: 193 mg/dL — ABNORMAL HIGH (ref 65–99)

## 2015-04-12 LAB — THEOPHYLLINE LEVEL: THEOPHYLLINE LVL: 2 ug/mL — AB (ref 10.0–20.0)

## 2015-04-12 MED ORDER — ESCITALOPRAM OXALATE 20 MG PO TABS
20.0000 mg | ORAL_TABLET | Freq: Every day | ORAL | Status: DC
  Administered 2015-04-12 – 2015-04-14 (×3): 20 mg via ORAL
  Filled 2015-04-12 (×5): qty 1

## 2015-04-12 MED ORDER — ATORVASTATIN CALCIUM 20 MG PO TABS
20.0000 mg | ORAL_TABLET | Freq: Every day | ORAL | Status: DC
  Administered 2015-04-12 – 2015-04-14 (×3): 20 mg via ORAL
  Filled 2015-04-12 (×4): qty 1

## 2015-04-12 MED ORDER — THEOPHYLLINE ER 400 MG PO TB24
400.0000 mg | ORAL_TABLET | Freq: Every day | ORAL | Status: DC
  Administered 2015-04-12 – 2015-04-15 (×4): 400 mg via ORAL
  Filled 2015-04-12 (×4): qty 1

## 2015-04-12 MED ORDER — CHLORHEXIDINE GLUCONATE 0.12 % MT SOLN
15.0000 mL | Freq: Two times a day (BID) | OROMUCOSAL | Status: DC
  Administered 2015-04-12 – 2015-04-15 (×6): 15 mL via OROMUCOSAL
  Filled 2015-04-12 (×8): qty 15

## 2015-04-12 MED ORDER — FENTANYL CITRATE (PF) 100 MCG/2ML IJ SOLN: 12.5000 ug | INTRAMUSCULAR | Status: DC | PRN

## 2015-04-12 MED ORDER — METHYLPREDNISOLONE SODIUM SUCC 125 MG IJ SOLR
60.0000 mg | Freq: Two times a day (BID) | INTRAMUSCULAR | Status: DC
  Administered 2015-04-12 – 2015-04-13 (×2): 60 mg via INTRAVENOUS
  Filled 2015-04-12: qty 2
  Filled 2015-04-12 (×2): qty 0.96

## 2015-04-12 MED ORDER — FAMOTIDINE 20 MG PO TABS
20.0000 mg | ORAL_TABLET | Freq: Two times a day (BID) | ORAL | Status: DC
  Administered 2015-04-12 – 2015-04-15 (×7): 20 mg via ORAL
  Filled 2015-04-12 (×9): qty 1

## 2015-04-12 MED ORDER — CLONAZEPAM 1 MG PO TABS
2.0000 mg | ORAL_TABLET | Freq: Every evening | ORAL | Status: DC | PRN
  Administered 2015-04-12 – 2015-04-14 (×2): 2 mg via ORAL
  Filled 2015-04-12 (×2): qty 2

## 2015-04-12 MED ORDER — CETYLPYRIDINIUM CHLORIDE 0.05 % MT LIQD
7.0000 mL | Freq: Two times a day (BID) | OROMUCOSAL | Status: DC
  Administered 2015-04-12 – 2015-04-14 (×6): 7 mL via OROMUCOSAL

## 2015-04-12 NOTE — Progress Notes (Signed)
Transfer note:  Arrival Method: Wheelchair from [employer] Mental Orientation: A&OX4 Telemetry: Box 6E11  Assessment: See flow sheets  Skin: Dry; intact IV: L hand & R A/C- infusing Pain: Denies Tubes: N/A Safety Measures: Bed in lowest position, call light and all other items within reach, bed alarm activated.    6700 Orientation: Patient has been oriented to the unit, staff and to the room.  Orders have been reviewed and implemented. Will continue to monitor pt.  Ernest [name]

## 2015-04-12 NOTE — Procedures (Signed)
Extubation Procedure Note  Patient Details:   Name: Ernest [name] DOB: September 26, 1956 MRN: [id number]   Airway Documentation:  Airway 7.5 mm (Active)  Secured at (cm) 24 cm 04/12/2015  7:49 AM  Measured From Lips 04/12/2015  7:49 AM  [place] 04/12/2015  7:49 AM  Secured By [employer] 04/12/2015  7:49 AM  Tube Holder Repositioned Yes 04/12/2015  7:49 AM  Cuff Pressure (cm H2O) 26 cm H2O 04/12/2015  7:49 AM  Site Condition Dry 04/12/2015  7:49 AM    Evaluation  O2 sats: stable throughout Complications: No apparent complications Patient did tolerate procedure well. Bilateral Breath Sounds: Diminished, Expiratory wheezes Suctioning: Airway Yes   Patient extubated to [name]. No complications. Patient tolerated well. Vital signs stable throughout. RN at bedside. RT will continue to monitor.  Mcneil Sober 04/12/2015, 8:20 AM

## 2015-04-12 NOTE — Progress Notes (Signed)
PULMONARY / CRITICAL CARE MEDICINE   Name: Ernest [name] MRN: [id number] DOB: 11-15-1956    ADMISSION DATE:  04/11/2015 CONSULTATION DATE:  04/12/2015  REFERRING MD :  EDP  CHIEF COMPLAINT:  SOB  INITIAL PRESENTATION:  59 y.o. M brought to [facility] ED 5/17 with SOB.  In ED, was unresponsive so was intubated for airway protection.  PCCM called for admission.   STUDIES:  CXR 5/17 >>> worsening LUL consolidation in area of known neoplasm and post radiation chang. Diffuse interstitial prominence seen.  SIGNIFICANT EVENTS: 5/17 - admitted with hypercarbic respiratory failure and ? PNA 5/18 extubated, troponin elevated overnight  SUBJECTIVE: feeling better, passing SBT, less wheezing, troponin positive  VITAL SIGNS: Temp:  [97.3 F (36.3 C)-100 F (37.8 C)] 98.3 F (36.8 C) (05/18 0700) Pulse Rate:  [57-95] 71 (05/18 0749) Resp:  [13-29] 14 (05/18 0749) BP: (88-153)/(48-85) 115/63 mmHg (05/18 0749) SpO2:  [90 %-99 %] 92 % (05/18 0749) FiO2 (%):  [40 %-50 %] 40 % (05/18 0749) Weight:  [92.4 kg (203 lb 11.3 oz)] 92.4 kg (203 lb 11.3 oz) (05/18 0458) HEMODYNAMICS:   VENTILATOR SETTINGS: Vent Mode:  [-] CPAP;PSV FiO2 (%):  [40 %-50 %] 40 % Set Rate:  [15 bmp-28 bmp] 15 bmp Vt Set:  [550 mL-600 mL] 600 mL PEEP:  [5 cmH20] 5 cmH20 Pressure Support:  [5 cmH20-8 cmH20] 8 cmH20 Plateau Pressure:  [12 cmH20-24 cmH20] 14 cmH20 INTAKE / OUTPUT: Intake/Output      05/17 0701 - 05/18 0700 05/18 0701 - 05/19 0700   I.V. (mL/kg) 489.1 (5.3)    NG/GT 388.3    IV Piggyback 587.5 12.5   Total Intake(mL/kg) 1464.9 (15.9) 12.5 (0.1)   Urine (mL/kg/hr) 1275 (0.6)    Total Output 1275     Net +189.9 +12.5          PHYSICAL EXAMINATION: General: comfortable on vent HENT: NCAT ETT in place PULM; some wheezing, good air movement CV: RRR, no mgr GI: BS+, soft, nontender MSK: normal bulk and tone Neuro: Awake and alert, interactive  LABS:  CBC  Recent Labs Lab 04/11/15 0235  04/11/15 0529  WBC 27.0* 21.5*  HGB 17.0 16.0  HCT 53.6* 49.2  PLT 167 125*   Coag's No results for input(s): APTT, INR in the last 168 hours. BMET  Recent Labs Lab 04/11/15 0235 04/11/15 0529 04/12/15 0224  NA 139 138 138  K 4.4 3.8 4.4  CL 100* 99* 100*  CO2 '26 26 31  '$ BUN 12 14 30*  CREATININE 1.13 1.27* 1.19  GLUCOSE 255* 177* 172*   Electrolytes  Recent Labs Lab 04/11/15 0235 04/11/15 0529 04/12/15 0224  CALCIUM 9.4 8.8* 9.0  MG  --  1.8  --   PHOS  --  5.5*  --    Sepsis Markers  Recent Labs Lab 04/11/15 0244 04/11/15 0311  LATICACIDVEN  --  2.67*  PROCALCITON <0.10  --    ABG  Recent Labs Lab 04/11/15 0418 04/11/15 0600  PHART 7.172* 7.301*  PCO2ART 79.6* 53.8*  PO2ART 292* 124*   Liver Enzymes No results for input(s): AST, ALT, ALKPHOS, BILITOT, ALBUMIN in the last 168 hours. 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  P:   Attempt extubationtoday Wean solumedrol Continue bronchodilators as ordered May need BIPAP PRN O2 as needed for O2 saturation > 90% May need another CT chest this admission given RUL pneumonia Restart theophylline  CARDIOVASCULAR A:  Hx HTN, CAD, HLD, PVD Demand ischemia, no EKG changes P:  Continue ASA Continue metoprolol Add back home lipitor Echo Daily EKG  RENAL A:   No acute issues P:  Monitor BMET and UOP Replace electrolytes as  needed  GASTROINTESTINAL A:   GERD Nutrition P:   SUP: change to oral pepcid Advance diet  HEMATOLOGIC / ONCOLOGIC A:   Hx stage IIIA NSCLC - s/p XRT / chemo VTE Prophylaxis P:  Will likely repeat CT chest in hospital, eval as dyspnea improves SCD's / Heparin   INFECTIOUS A:   ? HCAP P:   BCx2 5/17 > Sputum Cx 5/17 > Abx: Vanc, start date 5/17, day 2/x Abx: Zosyn, start date 5/17, day 2/x  ENDOCRINE A:   DM   P:   CBG's q4hr. SSI. Hold outpatient lantus.  NEUROLOGIC A:   Acute metabolic / hypercarbic encephalopathy Hx depression / anxiety P:   Sedation:  D/c after extubation Resume lexapro today  Family updated: Wife at bedside on 5/17.  Interdisciplinary Family Meeting v Palliative Care Meeting:  Due by: 5/23.   My cc time 40 minutes  Roselie Awkward, MD [place] PCCM Pager: [phone] Cell: [phone] If no response, call [phone]

## 2015-04-12 NOTE — Care Management Note (Signed)
Case Management Note  Patient Details  Name: Ernest [name] MRN: [id number] Date of Birth: Apr 09, 1956  Subjective/Objective:                  Pneumonia - extubated today.  Lives at home with wife, Rodena Piety.  Has oxygen at home through [place].  Independent, wife works.  No other needs identified.   Action/Plan: Wife has full oxygen tanks in care for transport home.   Expected Discharge Date:                  Expected Discharge Plan:  Home/Self Care  In-House Referral:     Discharge planning Services     Post Acute Care Choice:  Durable Medical Equipment, Resumption of Svcs/PTA Provider Choice offered to:     DME Arranged:    DME Agency:  [place]  HH Arranged:    [place] Agency:     Status of Service:  In process, will continue to follow  Medicare Important Message Given:    Date Medicare IM Given:    Medicare IM give by:    Date Additional Medicare IM Given:    Additional Medicare Important Message give by:     If discussed at [place] of Stay Meetings, dates discussed:    Additional Comments:  Vergie Living, RN 04/12/2015, 1:56 PM

## 2015-04-12 NOTE — Progress Notes (Addendum)
eLink Physician-Brief Progress Note Patient Name: Ernest [name] DOB: [birth year] MRN: [id number]   Date of Service  04/12/2015  HPI/Events of Note  Patient requests home Klonopin and Remeron for sleep.  eICU Interventions  Will order Klonopin 2 mg PO Q HS PRN Sleep.      Intervention Category Minor Interventions: Routine modifications to care plan (e.g. PRN medications for pain, fever)  Sommer,Steven Eugene 04/12/2015, 10:29 PM

## 2015-04-12 NOTE — Progress Notes (Signed)
MEDICATION RELATED CONSULT NOTE - FOLLOW UP   Pharmacy Consult:  Theophylline Indication:  COPD / asthma  Allergies  Allergen Reactions  . Carboplatin Shortness Of Breath, Swelling and Rash    Swelling of lips, rash on face,eyes and head    Patient Measurements: Weight: 203 lb 11.3 oz (92.4 kg)  Vital Signs: Temp: 98.1 F (36.7 C) (05/18 0800) BP: 119/50 mmHg (05/18 0900) Pulse Rate: 67 (05/18 0900) Intake/Output from previous day: 05/17 0701 - 05/18 0700 In: 1464.9 [I.V.:489.1; NG/GT:388.3; IV Piggyback:587.5] Out: 1275 [Urine:1275] Intake/Output from this shift: Total I/O In: 210 [I.V.:15; NG/GT:145; IV Piggyback:50] Out: 100 [Urine:100]  Labs:  Recent Labs  04/11/15 0235 04/11/15 0529 04/12/15 0224  WBC 27.0* 21.5*  --   HGB 17.0 16.0  --   HCT 53.6* 49.2  --   PLT 167 125*  --   CREATININE 1.13 1.27* 1.19  MG  --  1.8  --   PHOS  --  5.5*  --    Estimated Creatinine Clearance: 70.2 mL/min (by C-G formula based on Cr of 1.19).   Microbiology: Recent Results (from the past 720 hour(s))  Culture, blood (routine x 2)     Status: None (Preliminary result)   Collection Time: 04/11/15  2:35 AM  Result Value Ref Range Status   Specimen Description BLOOD RIGHT ARM  Final   Special Requests BOTTLES DRAWN AEROBIC ONLY 2CC  Final   Culture   Final           BLOOD CULTURE RECEIVED NO GROWTH TO DATE CULTURE WILL BE HELD FOR 5 DAYS BEFORE ISSUING A FINAL NEGATIVE REPORT Performed at [facility]    Report Status PENDING  Incomplete  Culture, blood (routine x 2)     Status: None (Preliminary result)   Collection Time: 04/11/15  3:22 AM  Result Value Ref Range Status   Specimen Description BLOOD RIGHT FOREARM  Final   Special Requests BOTTLES DRAWN AEROBIC AND ANAEROBIC 10CC EA  Final   Culture   Final           BLOOD CULTURE RECEIVED NO GROWTH TO DATE CULTURE WILL BE HELD FOR 5 DAYS BEFORE ISSUING A FINAL NEGATIVE REPORT Note: Culture results may be  compromised due to an excessive volume of blood received in culture bottles. Performed at [facility]    Report Status PENDING  Incomplete  MRSA PCR Screening     Status: None   Collection Time: 04/11/15  4:57 AM  Result Value Ref Range Status   MRSA by PCR NEGATIVE NEGATIVE Final    Comment:        The GeneXpert MRSA Assay (FDA approved for NASAL specimens only), is one component of a comprehensive MRSA colonization surveillance program. It is not intended to diagnose MRSA infection nor to guide or monitor treatment for MRSA infections.   Culture, respiratory (NON-Expectorated)     Status: None (Preliminary result)   Collection Time: 04/11/15  8:24 AM  Result Value Ref Range Status   Specimen Description TRACHEAL ASPIRATE  Final   Special Requests NONE  Final   Gram Stain   Final    RARE WBC PRESENT,BOTH PMN AND MONONUCLEAR RARE SQUAMOUS EPITHELIAL CELLS PRESENT RARE GRAM POSITIVE COCCI IN PAIRS Performed at [facility]    Culture   Final    Culture reincubated for better growth Performed at [facility]    Report Status PENDING  Incomplete      Assessment: 59 YOM  with history of COPD, asthma, OSA on CPAP, and lung cancer presented s/p with SOB and became unresponsive in the ED requiring intubation.  He was on theophylline PTA and his renal function slightly worsened upon admit.  Given reason for admit, theophylline level was obtained.  It returned sub-therapeutic.  Patient now extubated and to resume home med.   Goal of Therapy:  Theophylline level: 5-15 mcg/mL   Plan:  - Resume home theophylline of '400mg'$  PO Q24H.  Adjustment per PCP. - Pharmacy will sign off.  Thank you for the consult!    Laquilla Dault D. Mina Marble, PharmD, BCPS Pager:  [phone] 04/12/2015, 10:48 AM

## 2015-04-13 ENCOUNTER — Inpatient Hospital Stay (HOSPITAL_COMMUNITY): Payer: Commercial Managed Care - HMO

## 2015-04-13 DIAGNOSIS — J9612 Chronic respiratory failure with hypercapnia: Secondary | ICD-10-CM | POA: Insufficient documentation

## 2015-04-13 DIAGNOSIS — R0902 Hypoxemia: Secondary | ICD-10-CM

## 2015-04-13 DIAGNOSIS — G894 Chronic pain syndrome: Secondary | ICD-10-CM

## 2015-04-13 DIAGNOSIS — R918 Other nonspecific abnormal finding of lung field: Secondary | ICD-10-CM

## 2015-04-13 DIAGNOSIS — J9620 Acute and chronic respiratory failure, unspecified whether with hypoxia or hypercapnia: Secondary | ICD-10-CM

## 2015-04-13 DIAGNOSIS — R0689 Other abnormalities of breathing: Secondary | ICD-10-CM

## 2015-04-13 DIAGNOSIS — J189 Pneumonia, unspecified organism: Secondary | ICD-10-CM | POA: Insufficient documentation

## 2015-04-13 LAB — CBC WITH DIFFERENTIAL/PLATELET
BASOS PCT: 0 % (ref 0–1)
Basophils Absolute: 0 10*3/uL (ref 0.0–0.1)
EOS ABS: 0 10*3/uL (ref 0.0–0.7)
Eosinophils Relative: 0 % (ref 0–5)
HCT: 42.4 % (ref 39.0–52.0)
Hemoglobin: 13.6 g/dL (ref 13.0–17.0)
Lymphocytes Relative: 3 % — ABNORMAL LOW (ref 12–46)
Lymphs Abs: 0.5 10*3/uL — ABNORMAL LOW (ref 0.7–4.0)
MCH: 30.4 pg (ref 26.0–34.0)
MCHC: 32.1 g/dL (ref 30.0–36.0)
MCV: 94.6 fL (ref 78.0–100.0)
Monocytes Absolute: 0.8 10*3/uL (ref 0.1–1.0)
Monocytes Relative: 5 % (ref 3–12)
NEUTROS PCT: 91 % — AB (ref 43–77)
Neutro Abs: 13.9 10*3/uL — ABNORMAL HIGH (ref 1.7–7.7)
Platelets: 86 10*3/uL — ABNORMAL LOW (ref 150–400)
RBC: 4.48 MIL/uL (ref 4.22–5.81)
RDW: 14.2 % (ref 11.5–15.5)
WBC: 15.3 10*3/uL — ABNORMAL HIGH (ref 4.0–10.5)

## 2015-04-13 LAB — BASIC METABOLIC PANEL
ANION GAP: 8 (ref 5–15)
BUN: 23 mg/dL — ABNORMAL HIGH (ref 6–20)
CALCIUM: 8.5 mg/dL — AB (ref 8.9–10.3)
CHLORIDE: 100 mmol/L — AB (ref 101–111)
CO2: 30 mmol/L (ref 22–32)
Creatinine, Ser: 0.88 mg/dL (ref 0.61–1.24)
GFR calc Af Amer: >60 mL/min (ref >60)
GLUCOSE: 146 mg/dL — AB (ref 65–99)
Potassium: 4.7 mmol/L (ref 3.5–5.1)
Sodium: 138 mmol/L (ref 135–145)

## 2015-04-13 LAB — GLUCOSE, CAPILLARY
GLUCOSE-CAPILLARY: 165 mg/dL — AB (ref 65–99)
Glucose-Capillary: 126 mg/dL — ABNORMAL HIGH (ref 65–99)
Glucose-Capillary: 140 mg/dL — ABNORMAL HIGH (ref 65–99)

## 2015-04-13 MED ORDER — MORPHINE SULFATE ER 15 MG PO TBCR
15.0000 mg | EXTENDED_RELEASE_TABLET | Freq: Two times a day (BID) | ORAL | Status: DC
  Administered 2015-04-13 – 2015-04-14 (×3): 15 mg via ORAL
  Filled 2015-04-13 (×3): qty 1

## 2015-04-13 MED ORDER — MODAFINIL 100 MG PO TABS
100.0000 mg | ORAL_TABLET | Freq: Every day | ORAL | Status: DC
  Administered 2015-04-14 – 2015-04-15 (×2): 100 mg via ORAL
  Filled 2015-04-13 (×2): qty 1

## 2015-04-13 MED ORDER — ARIPIPRAZOLE 5 MG PO TABS
5.0000 mg | ORAL_TABLET | Freq: Every day | ORAL | Status: DC
  Administered 2015-04-13 – 2015-04-14 (×2): 5 mg via ORAL
  Filled 2015-04-13 (×3): qty 1

## 2015-04-13 MED ORDER — ISOSORBIDE MONONITRATE ER 60 MG PO TB24
60.0000 mg | ORAL_TABLET | Freq: Every day | ORAL | Status: DC
  Administered 2015-04-13 – 2015-04-15 (×3): 60 mg via ORAL
  Filled 2015-04-13 (×4): qty 1

## 2015-04-13 MED ORDER — INSULIN ASPART 100 UNIT/ML ~~LOC~~ SOLN
0.0000 [IU] | Freq: Three times a day (TID) | SUBCUTANEOUS | Status: DC
  Administered 2015-04-13: 3 [IU] via SUBCUTANEOUS
  Administered 2015-04-14 (×2): 2 [IU] via SUBCUTANEOUS

## 2015-04-13 MED ORDER — PREDNISONE 20 MG PO TABS
40.0000 mg | ORAL_TABLET | Freq: Two times a day (BID) | ORAL | Status: DC
  Administered 2015-04-13 – 2015-04-15 (×4): 40 mg via ORAL
  Filled 2015-04-13 (×6): qty 2

## 2015-04-13 MED ORDER — ARMODAFINIL 150 MG PO TABS: 150.0000 mg | ORAL_TABLET | Freq: Every day | ORAL | Status: DC

## 2015-04-13 MED ORDER — LEVOFLOXACIN 750 MG PO TABS
750.0000 mg | ORAL_TABLET | Freq: Every day | ORAL | Status: DC
  Administered 2015-04-13 – 2015-04-15 (×3): 750 mg via ORAL
  Filled 2015-04-13 (×4): qty 1

## 2015-04-13 MED ORDER — METHYLPREDNISOLONE SODIUM SUCC 40 MG IJ SOLR
40.0000 mg | Freq: Two times a day (BID) | INTRAMUSCULAR | Status: DC
  Filled 2015-04-13 (×2): qty 1

## 2015-04-13 MED ORDER — INSULIN ASPART 100 UNIT/ML ~~LOC~~ SOLN: 0.0000 [IU] | Freq: Every day | SUBCUTANEOUS | Status: DC

## 2015-04-13 NOTE — Progress Notes (Signed)
ANTIBIOTIC CONSULT NOTE - FOLLOW UP  Pharmacy Consult for Vanc/Zosyn >> Levaquin Indication: PNA  Allergies  Allergen Reactions  . Carboplatin Shortness Of Breath, Swelling and Rash    Swelling of lips, rash on face,eyes and head    Patient Measurements: Weight: 218 lb (98.884 kg)  Vital Signs: Temp: 97.5 F (36.4 C) (05/19 0910) Temp Source: Oral (05/19 0910) BP: 147/67 mmHg (05/19 0910) Pulse Rate: 53 (05/19 0910) Intake/Output from previous day: 05/18 0701 - 05/19 0700 In: 1230 [P.O.:720; I.V.:15; NG/GT:145; IV Piggyback:350] Out: 1425 [Urine:1425] Intake/Output from this shift: Total I/O In: 240 [P.O.:240] Out: 200 [Urine:200]  Labs:  Recent Labs  04/11/15 0235 04/11/15 0529 04/12/15 0224 04/13/15 0450  WBC 27.0* 21.5*  --  15.3*  HGB 17.0 16.0  --  13.6  PLT 167 125*  --  86*  CREATININE 1.13 1.27* 1.19 0.88   Estimated Creatinine Clearance: 98.2 mL/min (by C-G formula based on Cr of 0.88). No results for input(s): VANCOTROUGH, VANCOPEAK, VANCORANDOM, GENTTROUGH, GENTPEAK, GENTRANDOM, TOBRATROUGH, TOBRAPEAK, TOBRARND, AMIKACINPEAK, AMIKACINTROU, AMIKACIN in the last 72 hours.   Microbiology: Recent Results (from the past 720 hour(s))  Culture, blood (routine x 2)     Status: None (Preliminary result)   Collection Time: 04/11/15  2:35 AM  Result Value Ref Range Status   Specimen Description BLOOD RIGHT ARM  Final   Special Requests BOTTLES DRAWN AEROBIC ONLY 2CC  Final   Culture   Final           BLOOD CULTURE RECEIVED NO GROWTH TO DATE CULTURE WILL BE HELD FOR 5 DAYS BEFORE ISSUING A FINAL NEGATIVE REPORT Performed at [facility]    Report Status PENDING  Incomplete  Culture, blood (routine x 2)     Status: None (Preliminary result)   Collection Time: 04/11/15  3:22 AM  Result Value Ref Range Status   Specimen Description BLOOD RIGHT FOREARM  Final   Special Requests BOTTLES DRAWN AEROBIC AND ANAEROBIC 10CC EA  Final   Culture   Final       BLOOD CULTURE RECEIVED NO GROWTH TO DATE CULTURE WILL BE HELD FOR 5 DAYS BEFORE ISSUING A FINAL NEGATIVE REPORT Note: Culture results may be compromised due to an excessive volume of blood received in culture bottles. Performed at [facility]    Report Status PENDING  Incomplete  MRSA PCR Screening     Status: None   Collection Time: 04/11/15  4:57 AM  Result Value Ref Range Status   MRSA by PCR NEGATIVE NEGATIVE Final    Comment:        The GeneXpert MRSA Assay (FDA approved for NASAL specimens only), is one component of a comprehensive MRSA colonization surveillance program. It is not intended to diagnose MRSA infection nor to guide or monitor treatment for MRSA infections.   Culture, respiratory (NON-Expectorated)     Status: None (Preliminary result)   Collection Time: 04/11/15  8:24 AM  Result Value Ref Range Status   Specimen Description TRACHEAL ASPIRATE  Final   Special Requests NONE  Final   Gram Stain   Final    RARE WBC PRESENT,BOTH PMN AND MONONUCLEAR RARE SQUAMOUS EPITHELIAL CELLS PRESENT RARE GRAM POSITIVE COCCI IN PAIRS Performed at [facility]    Culture   Final    Culture reincubated for better growth Performed at [facility]    Report Status PENDING  Incomplete    Anti-infectives    Start     Dose/Rate  Route Frequency Ordered Stop   04/13/15 1300  levofloxacin (LEVAQUIN) tablet 750 mg     750 mg Oral Daily 04/13/15 1157     04/11/15 0600  piperacillin-tazobactam (ZOSYN) IVPB 3.375 g  Status:  Discontinued     3.375 g 12.5 mL/hr over 240 Minutes Intravenous 3 times per day 04/11/15 0347 04/13/15 1150   04/11/15 0400  vancomycin (VANCOCIN) 1,250 mg in sodium chloride 0.9 % 250 mL IVPB  Status:  Discontinued     1,250 mg 166.7 mL/hr over 90 Minutes Intravenous Every 12 hours 04/11/15 0347 04/13/15 1150   04/11/15 0315  cefTRIAXone (ROCEPHIN) 1 g in dextrose 5 % 50 mL IVPB  Status:  Discontinued     1 g 100 mL/hr over 30  Minutes Intravenous  Once 04/11/15 0307 04/11/15 0346   04/11/15 0315  azithromycin (ZITHROMAX) 500 mg in dextrose 5 % 250 mL IVPB  Status:  Discontinued     500 mg 250 mL/hr over 60 Minutes Intravenous  Once 04/11/15 0307 04/11/15 0346      Assessment: 79 YOM with hx COPD + 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 Pharmacy now asked to de-escalate and dose Levaquin - po requested.  The patient's renal function was noted to be worse on admit than baseline, however has now trended down to normal ranges. SCr 0.88, CrCl~80-100 ml/min.   Goal of Therapy:  Proper antibiotics for infection/cultures adjusted for renal/hepatic function   Plan:  1. Levaquin 750 mg po daily 2. Pharmacy will sign off of protocol and renal function now stable and no immediate dose adjustments expected 3. Will continue to monitor for any necessary dose adjustments peripherally  Alycia Rossetti, PharmD, BCPS Clinical Pharmacist Pager: [phone] 04/13/2015 12:01 PM

## 2015-04-13 NOTE — Progress Notes (Signed)
Nutrition Follow-up  DOCUMENTATION CODES:  Obesity unspecified  INTERVENTION:   (Continue with current nutrition plan of care)  NUTRITION DIAGNOSIS:   (No nutrition dx at this time)  Inadequate oral intake related to inability to eat as evidenced by NPO status; resolved  GOAL:  Patient will meet greater than or equal to 90% of their needs  Goal met  MONITOR:  PO intake, Labs, Weight trends, Skin, I & O's  REASON FOR ASSESSMENT:  Consult Enteral/tube feeding initiation and management  ASSESSMENT: 59 y.o. M brought to [facility] ED 5/17 with SOB. In ED, was unresponsive so was intubated for airway protection. PCCM called for admission  Pt was in with MD at time of visit.   Pt was extubated on 04/12/15 and transferred from ICU to medical floor. He was advanced to a Heart Healthy/ Carb Modified diet. Appetite is good; noted 75% meal completion.  Wt hx reveals wt gain trend.   Labs reviewed. Cl: 100, BUN: 23, Calcium: 8.5, Glucose: 146. CBGS: 129-193.   Height:  Ht Readings from Last 1 Encounters:  03/21/15 5' 6"  (1.676 m)    Weight:  Wt Readings from Last 1 Encounters:  04/12/15 218 lb (98.884 kg)    Ideal Body Weight:  64.5 kg  Wt Readings from Last 10 Encounters:  04/12/15 218 lb (98.884 kg)  03/21/15 208 lb (94.348 kg)  01/31/15 204 lb 9.6 oz (92.806 kg)  01/25/15 200 lb (90.719 kg)  01/19/15 203 lb (92.08 kg)  12/29/14 196 lb 12.8 oz (89.268 kg)  12/15/14 198 lb 12.8 oz (90.175 kg)  11/15/14 195 lb 9.6 oz (88.724 kg)  11/11/14 191 lb 12.8 oz (87 kg)  10/27/14 191 lb 11.2 oz (86.955 kg)    BMI:  Body mass index is 35.2 kg/(m^2).  Estimated Nutritional Needs:  Kcal:  1950-2150  Protein:  85-95 grams  Fluid:  2.0-2.2 L  Skin:  Reviewed, no issues  Diet Order:  Diet heart healthy/carb modified Room service appropriate?: Yes; Fluid consistency:: Thin  EDUCATION NEEDS:  No education needs identified at this time   Intake/Output Summary (Last  24 hours) at 04/13/15 1452 Last data filed at 04/13/15 1330  Gross per 24 hour  Intake   1140 ml  Output   1150 ml  Net    -10 ml    Last BM:  04/11/15  Zohal Reny A. Jimmye Norman, RD, LDN, CDE Pager: [phone] After hours Pager: [phone]

## 2015-04-13 NOTE — Progress Notes (Signed)
  Echocardiogram 2D Echocardiogram has been performed.  Ernest [name] 04/13/2015, 2:31 PM

## 2015-04-13 NOTE — Progress Notes (Signed)
[facility] PROGRESS NOTE  Ernest [name] WJX:[id number] DOB: 21-Feb-1956 DOA: 04/11/2015  PCP: Drema Pry, DO  Brief HPI: 59 year old Caucasian male with a past medical history of COPD, obstructive sleep apnea, coronary artery disease who presented via EMS with shortness of breath. Patient was minimally responsive. He was intubated in the emergency department. He was admitted to the intensive care unit. After he stabilized and improved he was extubated and transferred to floor.  Past medical history:  Past Medical History  Diagnosis Date  . CAD (coronary artery disease)     Left Main 30% stenosis, LAD 20 - 30 % stenosis, first and second diagonal branchesat 40 - 50%  stenosis with small arteries, circumflex had 30% stenosis in the large obtuse marginal, RCA at 70 - 80%  stenosis [not felt to be occlusive after evaluation with flow wire], distal 50 - 60% stenosis - [name][  . COPD (chronic obstructive pulmonary disease)     Dr. Baird Lyons  . Depression   . Anxiety   . Hyperlipidemia   . Chronic insomnia   . Gout   . GERD (gastroesophageal reflux disease)   . PVD (peripheral vascular disease)     (ABI 0.9 on right and 0.89 on left)  severe left external iliac stenosis.  He  had successful stenting of his left external iliac per Dr. Burt Knack.  . DDD (degenerative disc disease)   . Hx of colonoscopy   . 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 OSA (obstructive sleep apnea)     NPSG 09/10/10- AHI 11.3/hr  . Hypertension     dr Percival Spanish  . History of radiation therapy 11/10/13- 12/29/13    left lung 6600 cGy in 33 sessions  . Cancer     lung  . Lung cancer 10/04/13    LUL squamous cell lung cancer    Consultants: Pulmonology  Procedures: Echocardiogram is pending  Antibiotics: Was initially placed on vancomycin and Zosyn. Changed over to Levaquin on 5/19.  Subjective: Patient feels better. He wants to go home. Denies any chest pain. Breathing is improved. No  nausea or vomiting.  Objective: Vital Signs  Filed Vitals:   04/12/15 2100 04/13/15 0545 04/13/15 0851 04/13/15 0910  BP: 124/33 151/65  147/67  Pulse: 69 58  53  Temp: 98.3 F (36.8 C) 97.8 F (36.6 C)  97.5 F (36.4 C)  TempSrc: Oral Oral  Oral  Resp: '18 18  18  '$ Weight: 98.884 kg (218 lb)     SpO2: 95% 100% 95% 100%    Intake/Output Summary (Last 24 hours) at 04/13/15 1548 Last data filed at 04/13/15 1330  Gross per 24 hour  Intake    840 ml  Output   1150 ml  Net   -310 ml   Filed Weights   04/11/15 0445 04/12/15 0458 04/12/15 2100  Weight: 93 kg (205 lb 0.4 oz) 92.4 kg (203 lb 11.3 oz) 98.884 kg (218 lb)    General appearance: alert, cooperative, appears stated age, no distress and morbidly obese Head: Normocephalic, without obvious abnormality, atraumatic Resp: Reasonably good air entry bilaterally. Scattered wheezes. Coarse breath sound. No crackles Cardio: regular rate and rhythm, S1, S2 normal, no murmur, click, rub or gallop GI: soft, non-tender; bowel sounds normal; no masses,  no organomegaly Extremities: extremities normal, atraumatic, no cyanosis or edema Neurologic: Alert and oriented 3. Cranial nerves II-12 intact. No focal deficits.  Lab Results:  Basic Metabolic Panel:  Recent Labs Lab 04/11/15 0235 04/11/15 0529 04/12/15  0224 04/13/15 0450  NA 139 138 138 138  K 4.4 3.8 4.4 4.7  CL 100* 99* 100* 100*  CO2 '26 26 31 30  '$ GLUCOSE 255* 177* 172* 146*  BUN 12 14 30* 23*  CREATININE 1.13 1.27* 1.19 0.88  CALCIUM 9.4 8.8* 9.0 8.5*  MG  --  1.8  --   --   PHOS  --  5.5*  --   --    CBC:  Recent Labs Lab 04/11/15 0235 04/11/15 0529 04/13/15 0450  WBC 27.0* 21.5* 15.3*  NEUTROABS 15.1*  --  13.9*  HGB 17.0 16.0 13.6  HCT 53.6* 49.2 42.4  MCV 97.6 94.8 94.6  PLT 167 125* 86*   Cardiac Enzymes:  Recent Labs Lab 04/11/15 0524 04/11/15 0846 04/11/15 1249 04/11/15 1852 04/12/15 0224  TROPONINI 0.38* 0.45* 0.63* 0.42* 0.25*   BNP  (last 3 results)  Recent Labs  04/11/15 0235  BNP 305.2*    CBG:  Recent Labs Lab 04/12/15 0727 04/12/15 1142 04/12/15 1544 04/12/15 2045 04/13/15 0737  GLUCAP 159* 176* 132* 193* 126*    Recent Results (from the past 240 hour(s))  Culture, blood (routine x 2)     Status: None (Preliminary result)   Collection Time: 04/11/15  2:35 AM  Result Value Ref Range Status   Specimen Description BLOOD RIGHT ARM  Final   Special Requests BOTTLES DRAWN AEROBIC ONLY 2CC  Final   Culture   Final           BLOOD CULTURE RECEIVED NO GROWTH TO DATE CULTURE WILL BE HELD FOR 5 DAYS BEFORE ISSUING A FINAL NEGATIVE REPORT Performed at [facility]    Report Status PENDING  Incomplete  Culture, blood (routine x 2)     Status: None (Preliminary result)   Collection Time: 04/11/15  3:22 AM  Result Value Ref Range Status   Specimen Description BLOOD RIGHT FOREARM  Final   Special Requests BOTTLES DRAWN AEROBIC AND ANAEROBIC 10CC EA  Final   Culture   Final           BLOOD CULTURE RECEIVED NO GROWTH TO DATE CULTURE WILL BE HELD FOR 5 DAYS BEFORE ISSUING A FINAL NEGATIVE REPORT Note: Culture results may be compromised due to an excessive volume of blood received in culture bottles. Performed at [facility]    Report Status PENDING  Incomplete  MRSA PCR Screening     Status: None   Collection Time: 04/11/15  4:57 AM  Result Value Ref Range Status   MRSA by PCR NEGATIVE NEGATIVE Final    Comment:        The GeneXpert MRSA Assay (FDA approved for NASAL specimens only), is one component of a comprehensive MRSA colonization surveillance program. It is not intended to diagnose MRSA infection nor to guide or monitor treatment for MRSA infections.   Culture, respiratory (NON-Expectorated)     Status: None (Preliminary result)   Collection Time: 04/11/15  8:24 AM  Result Value Ref Range Status   Specimen Description TRACHEAL ASPIRATE  Final   Special Requests NONE  Final    Gram Stain   Final    RARE WBC PRESENT,BOTH PMN AND MONONUCLEAR RARE SQUAMOUS EPITHELIAL CELLS PRESENT RARE GRAM POSITIVE COCCI IN PAIRS Performed at [facility]    Culture   Final    Culture reincubated for better growth Performed at [facility]    Report Status PENDING  Incomplete      Studies/Results: Dg Chest  Port 1 View  04/13/2015   CLINICAL DATA:  Community-acquired pneumonia  EXAM: PORTABLE CHEST - 1 VIEW  COMPARISON:  04/12/2015  FINDINGS: Cardiac shadow is within normal limits. The lungs are well aerated bilaterally. Persistent left apical scarring is noted stable in appearance with hilar retraction superiorly. Mild persistent right perihilar changes are noted although improved when compare with the prior exam. No new focal abnormality is seen. Endotracheal tube and nasogastric catheter have been removed.  IMPRESSION: Persistent but improved right perihilar infiltrate.  Stable left apical scarring.   Electronically Signed   By: Inez Catalina M.D.   On: 04/13/2015 07:50   Dg Chest Port 1 View  04/12/2015   CLINICAL DATA:  Respiratory failure and hypoxia.  EXAM: PORTABLE CHEST - 1 VIEW  COMPARISON:  04/11/2015 and CT chest 01/26/2015.  FINDINGS: Endotracheal tube terminates approximately 3.9 cm above the carina. Nasogastric tube is followed into the descending duodenum. Heart is at the upper limits of normal in size, stable. Improving airspace opacification in the apex of the left hemi thorax, with underlying post treatment scarring and retraction. Right perihilar airspace opacification is noted. Lungs are otherwise clear. No pleural fluid.  IMPRESSION: 1. Developing right perihilar airspace opacification. Difficult to exclude pneumonia. 2. Improving airspace consolidation at the apex of the left hemi thorax, with underlying radiation scarring and parenchymal retraction.   Electronically Signed   By: Lorin Picket M.D.   On: 04/12/2015 07:12    Medications:    Scheduled: . antiseptic oral rinse  7 mL Mouth Rinse q12n4p  . arformoterol  15 mcg Nebulization BID  . ARIPiprazole  5 mg Oral QHS  . aspirin  325 mg Oral Daily  . atorvastatin  20 mg Oral q1800  . budesonide (PULMICORT) nebulizer solution  0.5 mg Nebulization BID  . chlorhexidine  15 mL Mouth Rinse BID  . escitalopram  20 mg Oral QHS  . famotidine  20 mg Oral BID  . isosorbide mononitrate  60 mg Oral Daily  . levofloxacin  750 mg Oral Daily  . metoprolol tartrate  25 mg Per Tube BID  . [START ON 04/14/2015] modafinil  100 mg Oral Daily  . morphine  15 mg Oral Q12H  . predniSONE  40 mg Oral BID WC  . theophylline  400 mg Oral Daily   Continuous:  IOE:VOJJKK chloride, albuterol, clonazePAM, fentaNYL (SUBLIMAZE) injection  Assessment/Plan:  Principal Problem:   COPD with exacerbation Active Problems:   Depression with anxiety   Obstructive sleep apnea   Essential hypertension   CORONARY ATHEROSCLEROSIS NATIVE CORONARY ARTERY   COPD with asthma   Squamous cell lung cancer   Chronic respiratory failure with hypoxia   Respiratory failure   Acute COPD exacerbation with acute on chronic hypercarbic and hypoxemic respiratory failure Patient was initially intubated. He was extubated 5/18 and seems to be doing quite well. Pulmonology is following as well. Continue oxygen, along with nebulizer treatments. He is on steroids as well. This will be changed over to oral.  Questionable pneumonia Most likely community-acquired. He was initially placed on broad-spectrum antibiotics. Cultures are all negative. He'll be changed over to Levaquin.  History of non-small cell lung cancer Chest x-ray raised the possibility of scarring in the left upper lobe. Considering his history of cancer CT scan to be obtained. This will be ordered for tomorrow morning by pulmonology.  History of chronic pain Patient is on high-dose narcotics at home. This could be contributing to his wrist if the failure.  Pulmonology has contacted palliative medicine to assist with management. We will resume his long-acting narcotics at a lower dose.  History of depression and anxiety. Continue Lexapro.  Thrombocytopenia Platelet count noted to be decreasing. He is on subcutaneous heparin which will be stopped for now. Monitor counts. No overt bleeding.  Minimally elevated troponin in the setting of known coronary artery disease Likely due to demand ischemia. Echocardiogram is pending. He denies any chest pain. Nonspecific changes on EKG. Patient is followed by Dr. Percival Spanish.  History of diabetes mellitus type 2 Initiate sliding scale coverage.  DVT Prophylaxis: SCDs. Subcutaneous heparin discontinued due to dropping platelet counts.    Code Status: Full code  Family Communication: Discussed with the patient  Disposition Plan: Echocardiogram is pending. CT chest for tomorrow morning. He has been ambulating without much difficulty.  Follow-up Appointment?: Will need follow-up with his pulmonologist as well as cardiologist.   LOS: 2 days   [facility] Pager [phone] 04/13/2015, 3:48 PM  If 7PM-7AM, please contact night-coverage at [email or web address], password [email or web address]

## 2015-04-13 NOTE — Progress Notes (Signed)
PULMONARY / CRITICAL CARE MEDICINE   Name: Ernest [name] MRN: [id number] DOB: April 28, 1956    ADMISSION DATE:  04/11/2015 CONSULTATION DATE:  04/13/2015  REFERRING MD :  EDP  CHIEF COMPLAINT:  SOB  INITIAL PRESENTATION:  59 y.o. M brought to [facility] ED 5/17 with SOB.  In ED, was unresponsive so was intubated for airway protection.  PCCM called for admission.   STUDIES:  CXR 5/17 >> worsening LUL consolidation in area of known neoplasm and post radiation change. Diffuse interstitial prominence seen.  SIGNIFICANT EVENTS: 5/17  admitted with hypercarbic respiratory failure and ? PNA 5/18  extubated, troponin elevated overnight 5/19  Dyspnea with exertion, on 4L  SUBJECTIVE:  Pt reports dyspnea on exertion, gets very SOB with minimal activity.   VITAL SIGNS: Temp:  [97.5 F (36.4 C)-98.7 F (37.1 C)] 97.5 F (36.4 C) (05/19 0910) Pulse Rate:  [53-77] 53 (05/19 0910) Resp:  [18-25] 18 (05/19 0910) BP: (118-151)/(33-67) 147/67 mmHg (05/19 0910) SpO2:  [92 %-100 %] 100 % (05/19 0910) Weight:  [218 lb (98.884 kg)] 218 lb (98.884 kg) (05/18 2100)   INTAKE / OUTPUT: Intake/Output      05/18 0701 - 05/19 0700 05/19 0701 - 05/20 0700   P.O. 720 240   I.V. (mL/kg) 15 (0.2)    NG/GT 145    IV Piggyback 350    Total Intake(mL/kg) 1230 (12.4) 240 (2.4)   Urine (mL/kg/hr) 1425 (0.6) 200 (0.6)   Stool 0 (0)    Total Output 1425 200   Net -195 +40          PHYSICAL EXAMINATION: General: obese male in NAD HENT: AAOx4, speech clear, MAE PULM: prolonged exp phase, lungs bilaterally with good air movement  CV: RRR, no mgr GI: BS+, soft, nontender MSK: normal bulk and tone Neuro: Awake and alert, interactive, MAE, generalized weakness   LABS:  CBC  Recent Labs Lab 04/11/15 0235 04/11/15 0529 04/13/15 0450  WBC 27.0* 21.5* 15.3*  HGB 17.0 16.0 13.6  HCT 53.6* 49.2 42.4  PLT 167 125* 86*   BMET  Recent Labs Lab 04/11/15 0529 04/12/15 0224 04/13/15 0450  NA 138 138  138  K 3.8 4.4 4.7  CL 99* 100* 100*  CO2 '26 31 30  '$ BUN 14 30* 23*  CREATININE 1.27* 1.19 0.88  GLUCOSE 177* 172* 146*   Electrolytes  Recent Labs Lab 04/11/15 0529 04/12/15 0224 04/13/15 0450  CALCIUM 8.8* 9.0 8.5*  MG 1.8  --   --   PHOS 5.5*  --   --    Glucose  Recent Labs Lab 04/12/15 0359 04/12/15 0727 04/12/15 1142 04/12/15 1544 04/12/15 2045 04/13/15 0737  GLUCAP 156* 159* 176* 132* 193* 126*    Imaging CXR 5/19 images reviewed - improved but persistent R infiltrate  ASSESSMENT / PLAN:  PULMONARY OETT 5/17 >> 5/18 A: Acute on chronic hypercarbic and hypoxemic respiratory failure due to HCAP and AE COPD - improved COPD with exacerbation - PFTs from 2014 with FEV1 44%, ratio 66% OSA on CPAP Hx stage IIIA NSCLC - s/p XRT / chemo  P:   Solumedrol, reduce to 40 mg IV Q12 Pulmicort + Brovana BID with Q3 PRN albuterol  O2 as needed for O2 saturation > 90%, baseline 4L  Repeat CT chest in the am 5/20 given RUL pneumonia (restart home narcotics, hopeful this will reduce WOB and allow for better images).  R/O progression of disease vs HCAP.   Continue theophylline  HEMATOLOGIC / ONCOLOGIC  A:   Hx stage IIIA NSCLC - s/p XRT / chemo VTE Prophylaxis P:  Repeat CT chest as above, eval as dyspnea improves SCD's / Heparin  INFECTIOUS A:   ? HCAP P:   BCx2 5/17 > Sputum Cx 5/17 >  Abx: Vanc, start date 5/17, day 3/x Abx: Zosyn, start date 5/17, day 3/x   A: Chronic Pain  Anxiety / Depression  P:  Continue Lexapro  Will need to restart home narc's to observe effect - primary will manage Palliative Care consult requested regarding baseline narcotics and additional dyspnea relief. He is followed at a Pain Clinic. Given his diagnosis of lung cancer and dyspnea progression, he may need additional meds or change in regimen.  His pain clinic had been backing him down on his regimen out of concerns for breathing but he has noted an increase in dyspnea.   Appreciate Palliative Care input for symptom management.  Early involvement of goals of care.       Family updated: Patient updated   Noe Gens, NP-C [facility] Pgr: [phone] or [phone]  Attending note:  59 year old male with PMH of stage IIIA NSCLC s/p chemo and rad therapy presenting to the hospital with respiratory failure requiring intubation.  Patient was extubated and transferred out of the ICU.  Fells better.  Chest exam is clear at this point.   I reviewed CXR myself, perihilar infiltrate and apical scarring.   Case discussed with Dr. [place] Pink and NP.  Hypoxemia: due to PNA and COPD.  - Supplemental O2 as needed.  - On home O2 at 4 L [place] and now at current dose.  Hypercarbic respiratory failure due to COPD exacerbation.  - Continue steroids for now.  - BD as ordered, brovana and pulmicort.  - Do not allow sats to be above 95%.  - Ambulate.  - D/C BiPAP.  Pulmonary infiltrate as above:  - CT of the chest today to discern whether this is PNA or progression of malignancy.  PNA:   - Continue abx.  - F/u on cultures.  OSA: on CPAP at home.  - D/C BiPAP.  - Start CPAP as ordered.  Chronic pain/anxiety and depression  - Continue lexapro.  - Restart home narcotics.  - Spoke with palliative care to assist with chronic pain management.  Patient seen and examined, agree with above note.  I dictated the care and orders written for this patient under my direction.  Rush Farmer, MD [phone]

## 2015-04-14 ENCOUNTER — Inpatient Hospital Stay (HOSPITAL_COMMUNITY): Payer: Commercial Managed Care - HMO

## 2015-04-14 ENCOUNTER — Encounter (HOSPITAL_COMMUNITY): Payer: Self-pay

## 2015-04-14 DIAGNOSIS — Z515 Encounter for palliative care: Secondary | ICD-10-CM | POA: Insufficient documentation

## 2015-04-14 DIAGNOSIS — G4733 Obstructive sleep apnea (adult) (pediatric): Secondary | ICD-10-CM

## 2015-04-14 LAB — CULTURE, RESPIRATORY W GRAM STAIN

## 2015-04-14 LAB — GLUCOSE, CAPILLARY
GLUCOSE-CAPILLARY: 99 mg/dL (ref 65–99)
GLUCOSE-CAPILLARY: 117 mg/dL — AB (ref 65–99)
Glucose-Capillary: 146 mg/dL — ABNORMAL HIGH (ref 65–99)
Glucose-Capillary: 137 mg/dL — ABNORMAL HIGH (ref 65–99)

## 2015-04-14 MED ORDER — MORPHINE SULFATE ER 15 MG PO TBCR: 15.0000 mg | EXTENDED_RELEASE_TABLET | Freq: Two times a day (BID) | ORAL | Status: DC

## 2015-04-14 MED ORDER — MORPHINE SULFATE 15 MG PO TABS: 15.0000 mg | ORAL_TABLET | ORAL | Status: DC | PRN

## 2015-04-14 MED ORDER — LEVOFLOXACIN 750 MG PO TABS: 750.0000 mg | ORAL_TABLET | Freq: Every day | ORAL | Status: DC

## 2015-04-14 MED ORDER — ASPIRIN EC 81 MG PO TBEC: 81.0000 mg | DELAYED_RELEASE_TABLET | Freq: Every day | ORAL | Status: DC

## 2015-04-14 MED ORDER — PREDNISONE 10 MG PO TABS
ORAL_TABLET | ORAL | Status: DC
Start: 2015-04-14 — End: 2015-08-11

## 2015-04-14 MED ORDER — MORPHINE SULFATE ER 15 MG PO TBCR
30.0000 mg | EXTENDED_RELEASE_TABLET | Freq: Two times a day (BID) | ORAL | Status: DC
  Administered 2015-04-14 – 2015-04-15 (×2): 30 mg via ORAL
  Filled 2015-04-14 (×2): qty 2

## 2015-04-14 NOTE — Progress Notes (Signed)
PULMONARY / CRITICAL CARE MEDICINE   Name: Ernest [name] MRN: [id number] DOB: 06-20-1956    ADMISSION DATE:  04/11/2015 CONSULTATION DATE:  04/14/2015  REFERRING MD :  EDP  CHIEF COMPLAINT:  SOB  INITIAL PRESENTATION:  59 y.o. M brought to [facility] ED 5/17 with SOB.  In ED, was unresponsive so was intubated for airway protection.  PCCM called for admission.   STUDIES:  CXR 5/17 >> worsening LUL consolidation in area of known neoplasm and post radiation change. Diffuse interstitial prominence seen.  SIGNIFICANT EVENTS: 5/17  admitted with hypercarbic respiratory failure and ? PNA 5/18  extubated, troponin elevated overnight 5/19  Dyspnea with exertion, on 4L  SUBJECTIVE:  Better today  VITAL SIGNS: Temp:  [97.8 F (36.6 C)-98.4 F (36.9 C)] 98.3 F (36.8 C) (05/20 0853) Pulse Rate:  [55-60] 55 (05/20 0853) Resp:  [17-20] 18 (05/20 0853) BP: (125-162)/(51-53) 162/53 mmHg (05/20 0853) SpO2:  [98 %-100 %] 99 % (05/20 0853) Weight:  [210 lb 11.2 oz (95.573 kg)] 210 lb 11.2 oz (95.573 kg) (05/19 2024)   INTAKE / OUTPUT: Intake/Output      05/19 0701 - 05/20 0700 05/20 0701 - 05/21 0700   P.O. 480 360   I.V. (mL/kg)     NG/GT     IV Piggyback     Total Intake(mL/kg) 480 (5) 360 (3.8)   Urine (mL/kg/hr) 500 (0.2) 0 (0)   Stool 0 (0) 0 (0)   Total Output 500 0   Net -20 +360        Urine Occurrence  0 x   Stool Occurrence  0 x     PHYSICAL EXAMINATION: General: obese male in NAD HENT: AAOx4, speech clear, MAE PULM: prolonged exp phase, lungs bilaterally with good air movement  CV: RRR, no mgr GI: BS+, soft, nontender MSK: normal bulk and tone Neuro: Awake and alert, interactive, MAE, generalized weakness   LABS:  CBC  Recent Labs Lab 04/11/15 0235 04/11/15 0529 04/13/15 0450  WBC 27.0* 21.5* 15.3*  HGB 17.0 16.0 13.6  HCT 53.6* 49.2 42.4  PLT 167 125* 86*   BMET  Recent Labs Lab 04/11/15 0529 04/12/15 0224 04/13/15 0450  NA 138 138 138  K 3.8  4.4 4.7  CL 99* 100* 100*  CO2 '26 31 30  '$ BUN 14 30* 23*  CREATININE 1.27* 1.19 0.88  GLUCOSE 177* 172* 146*   Electrolytes  Recent Labs Lab 04/11/15 0529 04/12/15 0224 04/13/15 0450  CALCIUM 8.8* 9.0 8.5*  MG 1.8  --   --   PHOS 5.5*  --   --    Glucose  Recent Labs Lab 04/12/15 1544 04/12/15 2045 04/13/15 0737 04/13/15 1639 04/13/15 2129 04/14/15 0746  GLUCAP 132* 193* 126* 165* 140* 99    Imaging CXR 5/19 images reviewed - improved but persistent R infiltrate CT 5/20 >> ASSESSMENT / PLAN:  PULMONARY OETT 5/17 >> 5/18 A: Acute on chronic hypercarbic and hypoxemic respiratory failure due to HCAP and AE COPD - improved COPD with exacerbation - PFTs from 2014 with FEV1 44%, ratio 66%, Still smokes OSA on CPAP Hx stage IIIA NSCLC - s/p XRT / chemo  P:   Solumedrol, reduce to 40 mg IV Q12 Pulmicort + Brovana BID with Q3 PRN albuterol  O2 as needed for O2 saturation > 90%, baseline 4L  Repeat CT chest in the am 5/20 given RUL pneumonia (restart home narcotics, hopeful this will reduce WOB and allow for better images).  R/O progression  of disease vs HCAP.  Pending Continue theophylline  HEMATOLOGIC / ONCOLOGIC A:   Hx stage IIIA NSCLC - s/p XRT / chemo VTE Prophylaxis P:  Repeat CT chest as above, eval as dyspnea improves SCD's / Heparin  INFECTIOUS A:   ? HCAP P:   BCx2 5/17 > Sputum Cx 5/17 >  Abx: Vanc, start date 5/17>>5/19 Abx: Zosyn, start date 5/17>>5/19 Levaquin 5/19>>  A: Chronic Pain  Anxiety / Depression  P:  Continue Lexapro  Will need to restart home narc's to observe effect - primary will manage Palliative Care consult requested regarding baseline narcotics and additional dyspnea relief. He is followed at a Pain Clinic. Given his diagnosis of lung cancer and dyspnea progression, he may need additional meds or change in regimen.  His pain clinic had been backing him down on his regimen out of concerns for breathing but he has noted an  increase in dyspnea.  Appreciate Palliative Care input for symptom management.  Early involvement of goals of care.       Family updated: Patient updated   Richardson Landry Minor ACNP Maryanna Shape PCCM Pager [phone] till 3 pm If no answer page [phone] 04/14/2015, 11:54 AM  Attending note:  59 year old male with PMH of stage IIIA NSCLC s/p chemo and rad therapy presenting to the hospital with respiratory failure requiring intubation. Patient was extubated and transferred out of the ICU. Fells better. Chest exam is clear at this point.   I reviewed CXR myself, perihilar infiltrate and apical scarring.   Case discussed with Dr. [place] Pink and NP.  Hypoxemia: due to PNA and COPD. - Supplemental O2 as needed. - On home O2 at 4 L [place] and now at current dose.  Hypercarbic respiratory failure due to COPD exacerbation. - Continue steroids for now. - BD as ordered, brovana and pulmicort. - Do not allow sats to be above 95%. - Ambulate. - D/C BiPAP.  Pulmonary infiltrate as above: - CT of the chest today to discern whether this is PNA or progression of malignancy.  PNA:  - Continue abx. - F/u on cultures.  OSA: on CPAP at home. - D/C BiPAP. - Start CPAP as ordered.  Chronic pain/anxiety and depression - Continue lexapro. - Restart home narcotics. - Spoke with palliative care to assist with chronic pain management.  Patient seen and examined, agree with above note. I dictated the care and orders written for this patient under my direction.  Rush Farmer, MD [phone]

## 2015-04-14 NOTE — Progress Notes (Signed)
[facility] PROGRESS NOTE  Ernest [name] MIW:[id number] DOB: 1956-08-17 DOA: 04/11/2015  PCP: Drema Pry, DO  Brief HPI: 59 year old Caucasian male with a past medical history of COPD, obstructive sleep apnea, coronary artery disease who presented via EMS with shortness of breath. Patient was minimally responsive. He was intubated in the emergency department. He was admitted to the intensive care unit. After he stabilized and improved he was extubated and transferred to floor. Pulmonology is following. CT of the chest to be done to evaluate the lung scarring considering his history of lung cancer.  Past medical history:  Past Medical History  Diagnosis Date  . CAD (coronary artery disease)     Left Main 30% stenosis, LAD 20 - 30 % stenosis, first and second diagonal branchesat 40 - 50%  stenosis with small arteries, circumflex had 30% stenosis in the large obtuse marginal, RCA at 70 - 80%  stenosis [not felt to be occlusive after evaluation with flow wire], distal 50 - 60% stenosis - [name][  . COPD (chronic obstructive pulmonary disease)     Dr. Baird Lyons  . Depression   . Anxiety   . Hyperlipidemia   . Chronic insomnia   . Gout   . GERD (gastroesophageal reflux disease)   . PVD (peripheral vascular disease)     (ABI 0.9 on right and 0.89 on left)  severe left external iliac stenosis.  He  had successful stenting of his left external iliac per Dr. Burt Knack.  . DDD (degenerative disc disease)   . Hx of colonoscopy   . 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 OSA (obstructive sleep apnea)     NPSG 09/10/10- AHI 11.3/hr  . Hypertension     dr Percival Spanish  . History of radiation therapy 11/10/13- 12/29/13    left lung 6600 cGy in 33 sessions  . Cancer     lung  . Lung cancer 10/04/13    LUL squamous cell lung cancer    Consultants: Pulmonology  Procedures:  Echocardiogram Study Conclusions - Left ventricle: The cavity size was normal. Systolic function wasnormal.  The estimated ejection fraction was in the range of 55%to 60%. Wall motion was normal; there were no regional wallmotion abnormalities. - Mitral valve: There was mild regurgitation. - Left atrium: The atrium was mildly dilated. - Right ventricle: The cavity size was mildly dilated. Wallthickness was normal. - Right atrium: The atrium was mildly dilated.  Antibiotics: Was initially placed on vancomycin and Zosyn. Changed over to Levaquin on 5/19.  Subjective: Patient continues to feel better. He states that his breathing is improving. Denies any chest pain. Tolerating diet. Has been ambulating without difficulty.   Objective: Vital Signs  Filed Vitals:   04/13/15 2024 04/14/15 0240 04/14/15 0500 04/14/15 0853  BP: 127/51  125/52 162/53  Pulse: 60  59 55  Temp: 97.8 F (36.6 C)  98.4 F (36.9 C) 98.3 F (36.8 C)  TempSrc: Oral  Oral Oral  Resp: '17  20 18  '$ Weight: 95.573 kg (210 lb 11.2 oz)     SpO2: 100% 99% 99% 99%    Intake/Output Summary (Last 24 hours) at 04/14/15 1243 Last data filed at 04/14/15 1209  Gross per 24 hour  Intake    840 ml  Output    300 ml  Net    540 ml   Filed Weights   04/12/15 0458 04/12/15 2100 04/13/15 2024  Weight: 92.4 kg (203 lb 11.3 oz) 98.884 kg (218 lb) 95.573 kg (210  lb 11.2 oz)    General appearance: alert, cooperative, appears stated age, no distress and morbidly obese Resp: Good air entry bilaterally. Few scattered wheezes. Coarse breath sound. No crackles Cardio: regular rate and rhythm, S1, S2 normal, no murmur, click, rub or gallop GI: soft, non-tender; bowel sounds normal; no masses,  no organomegaly Extremities: extremities normal, atraumatic, no cyanosis or edema Neurologic: Alert and oriented 3. Cranial nerves II-12 intact. No focal deficits.  Lab Results:  Basic Metabolic Panel:  Recent Labs Lab 04/11/15 0235 04/11/15 0529 04/12/15 0224 04/13/15 0450  NA 139 138 138 138  K 4.4 3.8 4.4 4.7  CL 100* 99* 100* 100*    CO2 '26 26 31 30  '$ GLUCOSE 255* 177* 172* 146*  BUN 12 14 30* 23*  CREATININE 1.13 1.27* 1.19 0.88  CALCIUM 9.4 8.8* 9.0 8.5*  MG  --  1.8  --   --   PHOS  --  5.5*  --   --    CBC:  Recent Labs Lab 04/11/15 0235 04/11/15 0529 04/13/15 0450  WBC 27.0* 21.5* 15.3*  NEUTROABS 15.1*  --  13.9*  HGB 17.0 16.0 13.6  HCT 53.6* 49.2 42.4  MCV 97.6 94.8 94.6  PLT 167 125* 86*   Cardiac Enzymes:  Recent Labs Lab 04/11/15 0524 04/11/15 0846 04/11/15 1249 04/11/15 1852 04/12/15 0224  TROPONINI 0.38* 0.45* 0.63* 0.42* 0.25*   BNP (last 3 results)  Recent Labs  04/11/15 0235  BNP 305.2*    CBG:  Recent Labs Lab 04/13/15 0737 04/13/15 1639 04/13/15 2129 04/14/15 0746 04/14/15 1157  GLUCAP 126* 165* 140* 99 146*    Recent Results (from the past 240 hour(s))  Culture, blood (routine x 2)     Status: None (Preliminary result)   Collection Time: 04/11/15  2:35 AM  Result Value Ref Range Status   Specimen Description BLOOD RIGHT ARM  Final   Special Requests BOTTLES DRAWN AEROBIC ONLY 2CC  Final   Culture   Final           BLOOD CULTURE RECEIVED NO GROWTH TO DATE CULTURE WILL BE HELD FOR 5 DAYS BEFORE ISSUING A FINAL NEGATIVE REPORT Performed at [facility]    Report Status PENDING  Incomplete  Culture, blood (routine x 2)     Status: None (Preliminary result)   Collection Time: 04/11/15  3:22 AM  Result Value Ref Range Status   Specimen Description BLOOD RIGHT FOREARM  Final   Special Requests BOTTLES DRAWN AEROBIC AND ANAEROBIC 10CC EA  Final   Culture   Final           BLOOD CULTURE RECEIVED NO GROWTH TO DATE CULTURE WILL BE HELD FOR 5 DAYS BEFORE ISSUING A FINAL NEGATIVE REPORT Note: Culture results may be compromised due to an excessive volume of blood received in culture bottles. Performed at [facility]    Report Status PENDING  Incomplete  MRSA PCR Screening     Status: None   Collection Time: 04/11/15  4:57 AM  Result Value Ref  Range Status   MRSA by PCR NEGATIVE NEGATIVE Final    Comment:        The GeneXpert MRSA Assay (FDA approved for NASAL specimens only), is one component of a comprehensive MRSA colonization surveillance program. It is not intended to diagnose MRSA infection nor to guide or monitor treatment for MRSA infections.   Culture, respiratory (NON-Expectorated)     Status: None   Collection Time: 04/11/15  8:24 AM  Result Value Ref Range Status   Specimen Description TRACHEAL ASPIRATE  Final   Special Requests NONE  Final   Gram Stain   Final    RARE WBC PRESENT,BOTH PMN AND MONONUCLEAR RARE SQUAMOUS EPITHELIAL CELLS PRESENT RARE GRAM POSITIVE COCCI IN PAIRS Performed at [facility]    Culture   Final    Non-Pathogenic Oropharyngeal-type Flora Isolated. Performed at [facility]    Report Status 04/14/2015 FINAL  Final      Studies/Results: Dg Chest Port 1 View  04/13/2015   CLINICAL DATA:  Community-acquired pneumonia  EXAM: PORTABLE CHEST - 1 VIEW  COMPARISON:  04/12/2015  FINDINGS: Cardiac shadow is within normal limits. The lungs are well aerated bilaterally. Persistent left apical scarring is noted stable in appearance with hilar retraction superiorly. Mild persistent right perihilar changes are noted although improved when compare with the prior exam. No new focal abnormality is seen. Endotracheal tube and nasogastric catheter have been removed.  IMPRESSION: Persistent but improved right perihilar infiltrate.  Stable left apical scarring.   Electronically Signed   By: Inez Catalina M.D.   On: 04/13/2015 07:50    Medications:  Scheduled: . antiseptic oral rinse  7 mL Mouth Rinse q12n4p  . arformoterol  15 mcg Nebulization BID  . ARIPiprazole  5 mg Oral QHS  . aspirin  325 mg Oral Daily  . atorvastatin  20 mg Oral q1800  . budesonide (PULMICORT) nebulizer solution  0.5 mg Nebulization BID  . chlorhexidine  15 mL Mouth Rinse BID  . escitalopram  20 mg Oral QHS   . famotidine  20 mg Oral BID  . insulin aspart  0-15 Units Subcutaneous TID WC  . insulin aspart  0-5 Units Subcutaneous QHS  . isosorbide mononitrate  60 mg Oral Daily  . levofloxacin  750 mg Oral Daily  . metoprolol tartrate  25 mg Per Tube BID  . modafinil  100 mg Oral Daily  . morphine  15 mg Oral Q12H  . predniSONE  40 mg Oral BID WC  . theophylline  400 mg Oral Daily   Continuous:  MOQ:HUTMLY chloride, albuterol, clonazePAM, fentaNYL (SUBLIMAZE) injection  Assessment/Plan:  Principal Problem:   COPD with exacerbation Active Problems:   Depression with anxiety   Obstructive sleep apnea   Essential hypertension   CORONARY ATHEROSCLEROSIS NATIVE CORONARY ARTERY   COPD with asthma   Squamous cell lung cancer   Chronic respiratory failure with hypoxia   Respiratory failure   Acute respiratory failure with hypercapnia   CAP (community acquired pneumonia)   Chronic pain syndrome   Acute COPD exacerbation with acute on chronic hypercarbic and hypoxemic respiratory failure Patient was initially intubated. He was extubated 5/18 and seems to be doing quite well. Pulmonology is following as well. Continue oxygen, along with nebulizer treatments. Continue steroid taper.  Questionable pneumonia Most likely community-acquired. He was initially placed on broad-spectrum antibiotics. Cultures are all negative. He was changed over to Levaquin.  History of non-small cell lung cancer Chest x-ray raised the possibility of scarring in the left upper lobe. Considering his history of cancer, CT scan to be obtained. This is pending as of now.  History of chronic pain Patient is on high-dose narcotics at home. This could be contributing to his respiratory failure. Pulmonology has contacted palliative medicine to assist with pain management. Continue with MS Contin at current dose.  History of depression and anxiety. Continue Lexapro.  Thrombocytopenia Platelet count was noted to  be  decreasing. He was on subcutaneous heparin which was discontinued. Repeat labs tomorrow morning. No overt bleeding.  Minimally elevated troponin in the setting of known coronary artery disease Peak troponin of 0.63. Elevation in troponin likely due to demand ischemia. Echocardiogram is as above. No wall motion abnormality was noted. Patient denies any chest pain. Nonspecific changes on EKG. Continue aspirin. Can be followed up by cardiology as an outpatient. Patient is followed by Dr. Percival Spanish.  History of diabetes mellitus type 2 Initiate sliding scale coverage.  History of obstructive sleep apnea On CPAP  DVT Prophylaxis: SCDs. Subcutaneous heparin discontinued due to dropping platelet counts.    Code Status: Full code  Family Communication: Discussed with the patient  Disposition Plan: CT chest is pending. Pain medications being adjusted. In this discharge tomorrow morning.   Follow-up Appointment?: Will need follow-up with his pulmonologist as well as cardiologist.   LOS: 3 days   [facility] Pager [phone] 04/14/2015, 12:43 PM  If 7PM-7AM, please contact night-coverage at [email or web address], password [facility]

## 2015-04-14 NOTE — Consult Note (Signed)
Consultation Note Date: 04/14/2015   Patient Name: Ernest [name]  DOB: 1955/12/26  MRN: [id number]  Age / Sex: 59 y.o., male   PCP: Doe-Hyun Kyra Searles, DO Referring Physician: Bonnielee Haff, MD  Reason for Consultation: Non pain symptom management and Pain control  Palliative Care Assessment and Plan Summary of Established Goals of Care and Medical Treatment Preferences    Palliative Care Discussion Held Today Contacts/Participants in Discussion: Primary Decision Maker: Pt and wife present    Pt is a 65 yo man with a h/o COPD , OSA ( untreated; not using CPAP) , hypercarbic respiratory failure and NSCLCA dx in 2014. He came in acutely dyspneic , unresponsive and subsequently intubated. He was found to have infiltrate near where neoplasm is in LUL. He is now alert, oriented. He has a h/o chronic pain and has been seen at [facility]. He has had chronic back, neck and shoulder pain since 2006. 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 In the past he has been on ms contin 90 mg TID and reports that pain clinic was weaning him down for fear this was contributing to his worsening dyspnea. Just prior to coming into the hospital he was on MS Contin 30 mg TID with Percocet 10/325 q8 prn for break thru pain. He was restarted on MS Contin 15 mg BID yesterday and has now 2 does. He rates his pain as a 4/10. His goal is a 2/10. Did discuss balance of pain with management of dyspnea , and avoidance of undue sedation. Discussed that level 3-4 may actually be more realistic. He is also not wearing his CPAP. States he feels like he is smothering even with the nasal pillows.   Code Status/Advance Care Planning:  Full Code  Symptom Management:   Pain: Pt doing fairly good on ms04 15 BID but has been on much higher doses in the past. Fear that this will not be adequate given severity of pain in the past . Will increase to MS Contin 30 mg BID and MS04 '15mg'$  q4  prn  Dyspnea: Discussed role of opioids in managing dyspnea in light of advanced COPD and now lung cancer. See above for dosing. Encouraged pt to aim for a pain score of 3-4 in order to balance wakefulness and avoid C02 retention. Pt also not using CPAP. Would BiPAP be more beneficial? Would recommend repeat sleep study to evaluate bipap since pt has "failed" CPAP  Depression: Feels that current medication regimen of klonopin lexapro and Abilify and nuvigil are working well. Dr. Casimiro Needle is managing psychiatrist and PCP prescribes lexapro.   Palliative Prophylaxis: bowel protocol for opioid related constipation  Psycho-social/Spiritual:   Support System: wife and family  Desire for further Chaplaincy support: not at this itme  Prognosis: Unable to determine  Discharge Planning:  Home with Home Health       Chief Complaint/History of Present Illness:Pt is a 59 yo man with end stage COPD, untreated OSA and NSCLCA admitted with hypercarbic respiratory failure. He was intubated and now is extubated.   Primary Diagnoses  Present on Admission:  . Respiratory failure . Obstructive sleep apnea . CORONARY ATHEROSCLEROSIS NATIVE CORONARY ARTERY . Depression with anxiety . Essential hypertension . COPD with asthma . Squamous cell lung cancer . COPD with exacerbation . Chronic respiratory failure with hypoxia  Palliative Review of Systems: Pt reporting pain to back neck shoulders as 4/10. Denies dyspnea at rest. No n/v.  I  have reviewed the medical record, interviewed the patient and family, and examined the patient. The following aspects are pertinent.  Past Medical History  Diagnosis Date  . CAD (coronary artery disease)     Left Main 30% stenosis, LAD 20 - 30 % stenosis, first and second diagonal branchesat 40 - 50%  stenosis with small arteries, circumflex had 30% stenosis in the large obtuse marginal, RCA at 70 - 80%  stenosis [not felt to be occlusive after evaluation with flow  wire], distal 50 - 60% stenosis - [name][  . COPD (chronic obstructive pulmonary disease)     Dr. Baird Lyons  . Depression   . Anxiety   . Hyperlipidemia   . Chronic insomnia   . Gout   . GERD (gastroesophageal reflux disease)   . PVD (peripheral vascular disease)     (ABI 0.9 on right and 0.89 on left)  severe left external iliac stenosis.  He  had successful stenting of his left external iliac per Dr. Burt Knack.  . DDD (degenerative disc disease)   . Hx of colonoscopy   . 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 OSA (obstructive sleep apnea)     NPSG 09/10/10- AHI 11.3/hr  . Hypertension     dr Percival Spanish  . History of radiation therapy 11/10/13- 12/29/13    left lung 6600 cGy in 33 sessions  . Cancer     lung  . Lung cancer 10/04/13    LUL squamous cell lung cancer   History   Social History  . Marital Status: Married    Spouse Name: N/A  . Number of Children: N/A  . Years of Education: N/A   Occupational History  . Disabled welder    Social History Main Topics  . Smoking status: Former Smoker -- 0.50 packs/day for 43 years    Types: Cigarettes    Quit date: 09/23/2013  . Smokeless tobacco: Never Used     Comment: history of 3 PPD, 11/02/13   . Alcohol Use: No  . Drug Use: No  . Sexual Activity: No   Other Topics Concern  . None   Social History Narrative   Family History  Problem Relation Age of Onset  . Heart attack Mother   . Heart attack Sister   . Lung cancer Sister   . Cancer Sister     small cell lung, mets to brain  . Emphysema Sister   . Hypertension Brother    Scheduled Meds: . antiseptic oral rinse  7 mL Mouth Rinse q12n4p  . arformoterol  15 mcg Nebulization BID  . ARIPiprazole  5 mg Oral QHS  . aspirin  325 mg Oral Daily  . atorvastatin  20 mg Oral q1800  . budesonide (PULMICORT) nebulizer solution  0.5 mg Nebulization BID  . chlorhexidine  15 mL Mouth Rinse BID  . escitalopram  20 mg Oral QHS  . famotidine  20 mg Oral BID  . insulin  aspart  0-15 Units Subcutaneous TID WC  . insulin aspart  0-5 Units Subcutaneous QHS  . isosorbide mononitrate  60 mg Oral Daily  . levofloxacin  750 mg Oral Daily  . metoprolol tartrate  25 mg Per Tube BID  . modafinil  100 mg Oral Daily  . morphine  15 mg Oral Q12H  . predniSONE  40 mg Oral BID WC  . theophylline  400 mg Oral Daily   Continuous Infusions:  PRN Meds:.sodium chloride, albuterol, clonazePAM, fentaNYL (SUBLIMAZE) injection Medications Prior to Admission:  Prior to Admission medications   Medication Sig Start Date End Date Taking? Authorizing Provider  albuterol (PROVENTIL) (2.5 MG/3ML) 0.083% nebulizer solution INHALE 1 VIAL IN NEBULIZER EVERY 4 HOURS AS NEEDED FOR WHEEZING OR SHORTNESS OF BREATH 01/19/15  Yes Deneise Lever, MD  ALPRAZolam Duanne Moron) 0.25 MG tablet Take 0.25 mg by mouth daily as needed. 03/10/14  Yes Doe-Hyun R Shawna Orleans, DO  ARIPiprazole (ABILIFY) 5 MG tablet Take 1 tablet (5 mg total) by mouth at bedtime. 03/30/13  Yes Doe-Hyun R Shawna Orleans, DO  Armodafinil (NUVIGIL) 150 MG tablet Take 150 mg by mouth daily.   Yes Historical Provider, MD  atorvastatin (LIPITOR) 20 MG tablet TAKE 1 TABLET (20 MG TOTAL) BY MOUTH DAILY. 08/16/14  Yes Doe-Hyun R Shawna Orleans, DO  clonazePAM (KLONOPIN) 1 MG tablet Take 2 mg by mouth at bedtime as needed. For anxiety/insomnia   Yes Historical Provider, MD  escitalopram (LEXAPRO) 20 MG tablet Take 1 tablet (20 mg total) by mouth daily. 01/25/15  Yes Doe-Hyun R Shawna Orleans, DO  esomeprazole (NEXIUM) 40 MG capsule Take 40 mg by mouth daily. 06/20/14  Yes Doe-Hyun R Shawna Orleans, DO  fluticasone (FLONASE) 50 MCG/ACT nasal spray Place 2 sprays into both nostrils daily. 06/20/14 06/20/15 Yes Doe-Hyun R Shawna Orleans, DO  Fluticasone-Salmeterol (ADVAIR DISKUS) 500-50 MCG/DOSE AEPB 1 puff then rinse mouth, twice daily maintenance 01/19/15  Yes Deneise Lever, MD  gabapentin (NEURONTIN) 100 MG capsule Take 100 mg by mouth daily.  05/25/13  Yes Historical Provider, MD  Insulin Glargine (LANTUS  SOLOSTAR) 100 UNIT/ML Solostar Pen 5 units at bedtime. 03/18/14  Yes Doe-Hyun R Shawna Orleans, DO  Insulin Pen Needle (BD PEN NEEDLE NANO U/F) 32G X 4 MM MISC 1 each by Does not apply route daily. 03/22/14  Yes Doe-Hyun R Shawna Orleans, DO  isosorbide mononitrate (IMDUR) 60 MG 24 hr tablet Take 1 tablet (60 mg total) by mouth daily. 11/11/14  Yes Minus Breeding, MD  methocarbamol (ROBAXIN) 500 MG tablet Take 500 mg by mouth 2 (two) times daily as needed for muscle spasms.  10/27/13  Yes Historical Provider, MD  metoprolol succinate (TOPROL-XL) 25 MG 24 hr tablet TAKE 1/2 TABLET BY MOUTH DAILY 08/09/14  Yes Minus Breeding, MD  mirtazapine (REMERON) 45 MG tablet Take 1 tablet (45 mg total) by mouth at bedtime. 12/29/14  Yes Tammy S Parrett, NP  nitroGLYCERIN (NITROSTAT) 0.4 MG SL tablet Place 1 tablet (0.4 mg total) under the tongue every 5 (five) minutes as needed for chest pain. 08/09/14  Yes Minus Breeding, MD  nystatin (MYCOSTATIN) 100000 UNIT/ML suspension Take 5 mLs (500,000 Units total) by mouth 4 (four) times daily. 01/25/15  Yes Doe-Hyun R Shawna Orleans, DO  polyethylene glycol powder (GLYCOLAX/MIRALAX) powder Take as directed for colonoscopy. 03/21/15  Yes Amy S Esterwood, PA-C  potassium chloride SA (K-DUR,KLOR-CON) 20 MEQ tablet Take 20 mEq by mouth daily.   Yes Historical Provider, MD  prochlorperazine (COMPAZINE) 10 MG tablet TAKE 1 TABLET BY MOUTH EVERY 6 HOURS AS NEEDED FOR NAUSEA AND VOMITING 07/28/14  Yes Curt Bears, MD  theophylline (THEO-24) 200 MG 24 hr capsule Take 200 mg by mouth 2 (two) times daily.   Yes Historical Provider, MD  theophylline (THEO-24) 400 MG 24 hr capsule Take 1 capsule (400 mg total) by mouth daily. 02/24/15  Yes Deneise Lever, MD  Tiotropium Bromide-Olodaterol (STIOLTO RESPIMAT) 2.5-2.5 MCG/ACT AERS Inhale 2 puffs into the lungs daily. 02/06/15  Yes Deneise Lever, MD  VENTOLIN HFA 108 (90 BASE) MCG/ACT inhaler INHALE 2  PUFFS INTO THE LUNGS 4 TIMES DAILY AS NEEDED FOR WHEEZING 04/07/15  Yes Deneise Lever, MD  VOLTAREN 1 % GEL Apply 2 g topically daily as needed (for pain).  06/24/12  Yes Historical Provider, MD  aspirin EC 81 MG tablet Take 1 tablet (81 mg total) by mouth daily. 04/14/15   Bonnielee Haff, MD  levofloxacin (LEVAQUIN) 750 MG tablet Take 1 tablet (750 mg total) by mouth daily. 04/14/15   Bonnielee Haff, MD  morphine (MS CONTIN) 15 MG 12 hr tablet Take 1 tablet (15 mg total) by mouth every 12 (twelve) hours. 04/14/15   Bonnielee Haff, MD  predniSONE (DELTASONE) 10 MG tablet Take 4 tablets twice daily for 3 days, then take 4 tablets once daily for 4 days, then take 2 tablets once daily for 4 days, then take 1 tablets once daily as before. 04/14/15   Bonnielee Haff, MD   Allergies  Allergen Reactions  . Carboplatin Shortness Of Breath, Swelling and Rash    Swelling of lips, rash on face,eyes and head   CBC:    Component Value Date/Time   WBC 15.3* 04/13/2015 0450   WBC 10.6* 10/21/2014 0852   HGB 13.6 04/13/2015 0450   HGB 14.0 10/21/2014 0852   HCT 42.4 04/13/2015 0450   HCT 44.3 10/21/2014 0852   PLT 86* 04/13/2015 0450   PLT 144 10/21/2014 0852   MCV 94.6 04/13/2015 0450   MCV 89.0 10/21/2014 0852   NEUTROABS 13.9* 04/13/2015 0450   NEUTROABS 8.1* 10/21/2014 0852   LYMPHSABS 0.5* 04/13/2015 0450   LYMPHSABS 1.1 10/21/2014 0852   MONOABS 0.8 04/13/2015 0450   MONOABS 1.0* 10/21/2014 0852   EOSABS 0.0 04/13/2015 0450   EOSABS 0.3 10/21/2014 0852   BASOSABS 0.0 04/13/2015 0450   BASOSABS 0.1 10/21/2014 0852   Comprehensive Metabolic Panel:    Component Value Date/Time   NA 138 04/13/2015 0450   NA 141 01/26/2015 0918   K 4.7 04/13/2015 0450   K 4.0 01/26/2015 0918   CL 100* 04/13/2015 0450   CO2 30 04/13/2015 0450   CO2 33* 01/26/2015 0918   BUN 23* 04/13/2015 0450   BUN 14.6 01/26/2015 0918   CREATININE 0.88 04/13/2015 0450   CREATININE 1.0 01/26/2015 0918   GLUCOSE 146* 04/13/2015 0450   GLUCOSE 140 01/26/2015 0918   CALCIUM 8.5* 04/13/2015 0450    CALCIUM 9.9 01/26/2015 0918   AST 22 01/26/2015 0918   AST 21 07/18/2014 1558   ALT 28 01/26/2015 0918   ALT 16 07/18/2014 1558   ALKPHOS 94 01/26/2015 0918   ALKPHOS 125* 07/18/2014 1558   BILITOT 0.68 01/26/2015 0918   BILITOT 0.3 07/18/2014 1558   PROT 6.3* 01/26/2015 0918   PROT 6.8 07/18/2014 1558   ALBUMIN 3.7 01/26/2015 0918   ALBUMIN 3.5 07/18/2014 1558    Physical Exam: Vital Signs: BP 162/53 mmHg  Pulse 55  Temp(Src) 98.3 F (36.8 C) (Oral)  Resp 18  Wt 95.573 kg (210 lb 11.2 oz)  SpO2 99% SpO2: SpO2: 99 % O2 Device: O2 Device: Nasal Cannula O2 Flow Rate: O2 Flow Rate (L/min): 4 L/min Intake/output summary:  Intake/Output Summary (Last 24 hours) at 04/14/15 1428 Last data filed at 04/14/15 1344  Gross per 24 hour  Intake    960 ml  Output    500 ml  Net    460 ml   LBM:   Baseline Weight: Weight: 94.3 kg (207 lb 14.3 oz) (from 03/21/15) Most recent weight: Weight:  95.573 kg (210 lb 11.2 oz)  Exam Findings:  General: Well nourished older man. No acute distress. A/O x4 Resp: NO work of breathing observed         Palliative Performance Scale: *60%             Additional Data Reviewed: Recent Labs     04/12/15  0224  04/13/15  0450  WBC   --   15.3*  HGB   --   13.6  PLT   --   86*  NA  138  138  BUN  30*  23*  CREATININE  1.19  0.88     Time In: 1330 Time Out: 1500 Time Total: 90 min  Greater than 50%  of this time was spent counseling and coordinating care related to the above assessment and plan. Discussed role of bipap going forward in terms of managing pt's C02 retention with CCM as well as medication recommendation with Dr [place] Pink  Signed by: Dory Horn, NP  Dory Horn, NP  04/14/2015, 2:28 PM  Please contact Palliative Medicine Team phone at [phone] for questions and concerns.

## 2015-04-15 LAB — CBC
HCT: 43.2 % (ref 39.0–52.0)
Hemoglobin: 14 g/dL (ref 13.0–17.0)
MCH: 30.4 pg (ref 26.0–34.0)
MCHC: 32.4 g/dL (ref 30.0–36.0)
MCV: 93.7 fL (ref 78.0–100.0)
Platelets: 88 10*3/uL — ABNORMAL LOW (ref 150–400)
RBC: 4.61 MIL/uL (ref 4.22–5.81)
RDW: 13.7 % (ref 11.5–15.5)
WBC: 9.8 10*3/uL (ref 4.0–10.5)

## 2015-04-15 LAB — BASIC METABOLIC PANEL
Anion gap: 6 (ref 5–15)
BUN: 16 mg/dL (ref 6–20)
CO2: 33 mmol/L — AB (ref 22–32)
CREATININE: 0.85 mg/dL (ref 0.61–1.24)
Calcium: 9 mg/dL (ref 8.9–10.3)
Chloride: 99 mmol/L — ABNORMAL LOW (ref 101–111)
GFR calc Af Amer: >60 mL/min (ref >60)
Glucose, Bld: 171 mg/dL — ABNORMAL HIGH (ref 65–99)
Potassium: 3.9 mmol/L (ref 3.5–5.1)
SODIUM: 138 mmol/L (ref 135–145)

## 2015-04-15 LAB — GLUCOSE, CAPILLARY: Glucose-Capillary: 100 mg/dL — ABNORMAL HIGH (ref 65–99)

## 2015-04-15 MED ORDER — MORPHINE SULFATE 15 MG PO TABS: 15.0000 mg | ORAL_TABLET | ORAL | Status: DC | PRN

## 2015-04-15 MED ORDER — ASPIRIN EC 81 MG PO TBEC: 81.0000 mg | DELAYED_RELEASE_TABLET | Freq: Every day | ORAL | Status: DC

## 2015-04-15 MED ORDER — MORPHINE SULFATE ER 30 MG PO TBCR: 30.0000 mg | EXTENDED_RELEASE_TABLET | Freq: Two times a day (BID) | ORAL | Status: DC

## 2015-04-15 NOTE — Discharge Summary (Signed)
[facility]  Physician Discharge Summary   Patient ID: REA RESER MRN: [id number] DOB/AGE: 1956/10/20 59 y.o.  Admit date: 04/11/2015 Discharge date: 04/15/2015  PCP: Drema Pry, DO  DISCHARGE DIAGNOSES:  Principal Problem:   COPD with exacerbation Active Problems:   Depression with anxiety   Obstructive sleep apnea   Essential hypertension   CORONARY ATHEROSCLEROSIS NATIVE CORONARY ARTERY   COPD with asthma   Squamous cell lung cancer   Chronic respiratory failure with hypoxia   Respiratory failure   Acute respiratory failure with hypercapnia   CAP (community acquired pneumonia)   Chronic pain syndrome   Palliative care encounter   RECOMMENDATIONS FOR OUTPATIENT FOLLOW UP: 1. Patient to discuss possibility of getting BiPAP at home with his pulmonologist 2. Cardiology office will call with appointment 3. Platelet counts decreased with heparin. Heparin was stopped. Counts are stable. Please follow as outpatient. 4. His pain medication regimen has been adjusted.  DISCHARGE CONDITION: fair  Diet recommendation: Modified carbohydrate  Filed Weights   04/12/15 2100 04/13/15 2024 04/14/15 2104  Weight: 98.884 kg (218 lb) 95.573 kg (210 lb 11.2 oz) 96.2 kg (212 lb 1.3 oz)    INITIAL HISTORY: 59 year old Caucasian male with a past medical history of COPD, obstructive sleep apnea, coronary artery disease who presented via EMS with shortness of breath. Patient was minimally responsive. He was intubated in the emergency department. He was admitted to the intensive care unit. After he stabilized and improved he was extubated and transferred to floor.   Consultations:  Pulmonology  Procedures: Echocardiogram Study Conclusions - Left ventricle: The cavity size was normal. Systolic function wasnormal. The estimated ejection fraction was in the range of 55%to 60%. Wall motion was normal; there were no regional wallmotion abnormalities. - Mitral valve: There  was mild regurgitation. - Left atrium: The atrium was mildly dilated. - Right ventricle: The cavity size was mildly dilated. Wallthickness was normal. - Right atrium: The atrium was mildly dilated.  HOSPITAL COURSE:   Acute COPD exacerbation with acute on chronic hypercarbic and hypoxemic respiratory failure Patient was initially intubated. He was extubated 5/18 and has done quite well. He was followed by pulmonology closely. He is on home oxygen, which should be continued. He should continue his inhalers and nebulizer treatments at home. He was also started on steroids, which will be tapered down. He is on daily prednisone at home.   Questionable pneumonia This is most likely community-acquired. He was initially placed on broad-spectrum antibiotics. Cultures are all negative. He was changed over to Levaquin.  History of non-small cell lung cancer Chest x-ray raised the possibility of scarring in the left upper lobe. Considering his history of cancer, CT scan was obtained. This shows chronic changes in the left upper lobe. Infectious infiltrates noted on the right side. No further workup currently. Further management per pulmonology as an outpatient.   History of chronic low back and neck pain Patient is on high-dose narcotics at home. This could be contributing to his respiratory failure. Pulmonology contacted palliative medicine to assist with pain management. His pain medication regimen was adjusted. Instead off Percocet he was asked to take short acting morphine.   History of depression and anxiety. Continue Lexapro.  Thrombocytopenia Platelet count was noted to be decreasing. He was on subcutaneous heparin which was discontinued. Repeat platelet counts today are stable. No bleeding has been noted. Can be followed as an outpatient.  Minimally elevated troponin in the setting of known coronary artery disease Peak troponin of  0.63. Elevation in troponin likely due to demand ischemia.  Echocardiogram is as above. No wall motion abnormality was noted. Patient denies any chest pain. Nonspecific changes on EKG. Continue aspirin. Can be followed up by cardiology as an outpatient. Patient is followed by Dr. Percival Spanish. Cardiology office will contact him for appointment.  History of diabetes mellitus type 2 Continue home medication regimen  History of obstructive sleep apnea On CPAP . It apparently he does not tolerate that well. He will discuss BiPAP with his pulmonologist at follow-up.  Patient is feeling better. He has been ambulating without difficulty. He wants to go home. He is stable for discharge.   PERTINENT LABS:  The results of significant diagnostics from this hospitalization (including imaging, microbiology, ancillary and laboratory) are listed below for reference.    Microbiology: Recent Results (from the past 240 hour(s))  Culture, blood (routine x 2)     Status: None (Preliminary result)   Collection Time: 04/11/15  2:35 AM  Result Value Ref Range Status   Specimen Description BLOOD RIGHT ARM  Final   Special Requests BOTTLES DRAWN AEROBIC ONLY 2CC  Final   Culture   Final           BLOOD CULTURE RECEIVED NO GROWTH TO DATE CULTURE WILL BE HELD FOR 5 DAYS BEFORE ISSUING A FINAL NEGATIVE REPORT Performed at [facility]    Report Status PENDING  Incomplete  Culture, blood (routine x 2)     Status: None (Preliminary result)   Collection Time: 04/11/15  3:22 AM  Result Value Ref Range Status   Specimen Description BLOOD RIGHT FOREARM  Final   Special Requests BOTTLES DRAWN AEROBIC AND ANAEROBIC 10CC EA  Final   Culture   Final           BLOOD CULTURE RECEIVED NO GROWTH TO DATE CULTURE WILL BE HELD FOR 5 DAYS BEFORE ISSUING A FINAL NEGATIVE REPORT Note: Culture results may be compromised due to an excessive volume of blood received in culture bottles. Performed at [facility]    Report Status PENDING  Incomplete  MRSA PCR Screening      Status: None   Collection Time: 04/11/15  4:57 AM  Result Value Ref Range Status   MRSA by PCR NEGATIVE NEGATIVE Final    Comment:        The GeneXpert MRSA Assay (FDA approved for NASAL specimens only), is one component of a comprehensive MRSA colonization surveillance program. It is not intended to diagnose MRSA infection nor to guide or monitor treatment for MRSA infections.   Culture, respiratory (NON-Expectorated)     Status: None   Collection Time: 04/11/15  8:24 AM  Result Value Ref Range Status   Specimen Description TRACHEAL ASPIRATE  Final   Special Requests NONE  Final   Gram Stain   Final    RARE WBC PRESENT,BOTH PMN AND MONONUCLEAR RARE SQUAMOUS EPITHELIAL CELLS PRESENT RARE GRAM POSITIVE COCCI IN PAIRS Performed at [facility]    Culture   Final    Non-Pathogenic Oropharyngeal-type Flora Isolated. Performed at [facility]    Report Status 04/14/2015 FINAL  Final     Labs: Basic Metabolic Panel:  Recent Labs Lab 04/11/15 0235 04/11/15 0529 04/12/15 0224 04/13/15 0450 04/15/15 0447  NA 139 138 138 138 138  K 4.4 3.8 4.4 4.7 3.9  CL 100* 99* 100* 100* 99*  CO2 '26 26 31 30 '$ 33*  GLUCOSE 255* 177* 172* 146* 171*  BUN 12  14 30* 23* 16  CREATININE 1.13 1.27* 1.19 0.88 0.85  CALCIUM 9.4 8.8* 9.0 8.5* 9.0  MG  --  1.8  --   --   --   PHOS  --  5.5*  --   --   --    CBC:  Recent Labs Lab 04/11/15 0235 04/11/15 0529 04/13/15 0450 04/15/15 0447  WBC 27.0* 21.5* 15.3* 9.8  NEUTROABS 15.1*  --  13.9*  --   HGB 17.0 16.0 13.6 14.0  HCT 53.6* 49.2 42.4 43.2  MCV 97.6 94.8 94.6 93.7  PLT 167 125* 86* 88*   Cardiac Enzymes:  Recent Labs Lab 04/11/15 0524 04/11/15 0846 04/11/15 1249 04/11/15 1852 04/12/15 0224  TROPONINI 0.38* 0.45* 0.63* 0.42* 0.25*   BNP: BNP (last 3 results)  Recent Labs  04/11/15 0235  BNP 305.2*    CBG:  Recent Labs Lab 04/14/15 0746 04/14/15 1157 04/14/15 1634 04/14/15 2103  04/15/15 0740  GLUCAP 99 146* 137* 117* 100*     IMAGING STUDIES Ct Chest Wo Contrast  04/14/2015   CLINICAL DATA:  Patient is still short of breath. Abnormal chest x-ray showed right infiltrates. Past medical history of asthma, cancer, hypertension, CAD, COPD, PVD. History of radiation therapy.  EXAM: CT CHEST WITHOUT CONTRAST  TECHNIQUE: Multidetector CT imaging of the chest was performed following the standard protocol without IV contrast.  COMPARISON:  01/26/2015  FINDINGS: Heart: Heart is mildly enlarged. Moderate coronary artery calcifications. No pericardial effusion.  Vascular structures: Ascending aorta ectasia. Thoracic aorta atherosclerosis. No aneurysm.  Mediastinum/thyroid: The visualized portion of the thyroid gland has a normal appearance. No significant adenopathy.  Lungs/Airways: The airways are patent. Left upper lobe consolidation, pleural thickening, and small air cavity appear stable compared with multiple prior studies. Left upper lobe volume loss and bronchiectasis. There is new infiltrate within the right upper lobe, and left lung base. Findings favor infectious process. Scattered emphysematous changes are present.  Upper abdomen: Low-attenuation lesion within the spleen appears stable. Visualized portions of the adrenal glands are normal.  Chest wall/osseous structures: Healed left first and second rib fractures.  IMPRESSION: 1. Stable appearance of left upper lobe over multiple prior studies, consistent with post treatment changes. 2. New infiltrate within the right upper lobe and left lower lobe, likely infectious. 3. Emphysematous changes. 4. Stable splenic lesion.   Electronically Signed   By: Nolon Nations M.D.   On: 04/14/2015 20:23   Dg Chest Port 1 View  04/13/2015   CLINICAL DATA:  Community-acquired pneumonia  EXAM: PORTABLE CHEST - 1 VIEW  COMPARISON:  04/12/2015  FINDINGS: Cardiac shadow is within normal limits. The lungs are well aerated bilaterally. Persistent  left apical scarring is noted stable in appearance with hilar retraction superiorly. Mild persistent right perihilar changes are noted although improved when compare with the prior exam. No new focal abnormality is seen. Endotracheal tube and nasogastric catheter have been removed.  IMPRESSION: Persistent but improved right perihilar infiltrate.  Stable left apical scarring.   Electronically Signed   By: Inez Catalina M.D.   On: 04/13/2015 07:50   Dg Chest Port 1 View  04/12/2015   CLINICAL DATA:  Respiratory failure and hypoxia.  EXAM: PORTABLE CHEST - 1 VIEW  COMPARISON:  04/11/2015 and CT chest 01/26/2015.  FINDINGS: Endotracheal tube terminates approximately 3.9 cm above the carina. Nasogastric tube is followed into the descending duodenum. Heart is at the upper limits of normal in size, stable. Improving airspace opacification in the apex of  the left hemi thorax, with underlying post treatment scarring and retraction. Right perihilar airspace opacification is noted. Lungs are otherwise clear. No pleural fluid.  IMPRESSION: 1. Developing right perihilar airspace opacification. Difficult to exclude pneumonia. 2. Improving airspace consolidation at the apex of the left hemi thorax, with underlying radiation scarring and parenchymal retraction.   Electronically Signed   By: Lorin Picket M.D.   On: 04/12/2015 07:12   Dg Chest Portable 1 View  04/11/2015   CLINICAL DATA:  Respiratory distress, intubation orogastric tube placement. History of lung cancer.  EXAM: PORTABLE CHEST - 1 VIEW  COMPARISON:  Chest radiograph December 15, 2014 and CT of the chest February 22, 2014  FINDINGS: Endotracheal tube tip projects 5.3 cm above the carina. Nasogastric tube looped in proximal stomach region, distal tip not imaged. Worsening consolidation LEFT upper lobe with diffuse interstitial prominence, increased from prior examination. Kerley B-lines RIGHT lung base. No pleural effusion. No pneumothorax.  ACDF. Soft tissue planes  and included osseous structure nonsuspicious.  IMPRESSION: Worsening LEFT upper lobe consolidation in area of known neoplasm and post radiation change. Diffuse interstitial prominence, which can be seen with atypical infection or pulmonary edema. Recommend short-term follow-up.  Endotracheal tube tip projects 5.3 cm above the carina. Nasogastric tube looped in proximal stomach region, distal tip not imaged.   Electronically Signed   By: Elon Alas   On: 04/11/2015 02:59   Dg Abd Portable 1v  04/11/2015   CLINICAL DATA:  Check nasogastric catheter placement  EXAM: PORTABLE ABDOMEN - 1 VIEW  COMPARISON:  None.  FINDINGS: Scattered large and small bowel gas is noted. Postsurgical changes are noted in the lower lumbar spine. A nasogastric catheter is noted in the distal stomach. No acute bony abnormality is seen.  IMPRESSION: Nasogastric catheter within the distal stomach.   Electronically Signed   By: Inez Catalina M.D.   On: 04/11/2015 11:04    DISCHARGE EXAMINATION: Filed Vitals:   04/14/15 2104 04/15/15 0422 04/15/15 0614 04/15/15 0821  BP: 149/60  163/61 156/63  Pulse: 54  48 61  Temp: 97.4 F (36.3 C)  97.7 F (36.5 C) 98.4 F (36.9 C)  TempSrc: Oral  Oral Oral  Resp: '17  18 16  '$ Weight: 96.2 kg (212 lb 1.3 oz)     SpO2: 99% 100% 99% 100%   General appearance: alert, cooperative and no distress Resp: Coarse breath sounds with a few wheezes bilaterally Cardio: regular rate and rhythm, S1, S2 normal, no murmur, click, rub or gallop GI: soft, non-tender; bowel sounds normal; no masses,  no organomegaly Extremities: extremities normal, atraumatic, no cyanosis or edema  DISPOSITION: Home  Discharge Instructions    Call MD for:  difficulty breathing, headache or visual disturbances    Complete by:  As directed      Call MD for:  extreme fatigue    Complete by:  As directed      Call MD for:  persistant dizziness or light-headedness    Complete by:  As directed      Call MD for:   persistant nausea and vomiting    Complete by:  As directed      Call MD for:  severe uncontrolled pain    Complete by:  As directed      Call MD for:  temperature >100.4    Complete by:  As directed      Diet Carb Modified    Complete by:  As directed  Discharge instructions    Complete by:  As directed   Please keep your appointment with Dr. Annamaria Boots. You will get a call from the Cardiology office for follow up. Please do note resume smoking. Also follow up with your pain management specialist.  You were cared for by a hospitalist during your hospital stay. If you have any questions about your discharge medications or the care you received while you were in the hospital after you are discharged, you can call the unit and asked to speak with the hospitalist on call if the hospitalist that took care of you is not available. Once you are discharged, your primary care physician will handle any further medical issues. Please note that NO REFILLS for any discharge medications will be authorized once you are discharged, as it is imperative that you return to your primary care physician (or establish a relationship with a primary care physician if you do not have one) for your aftercare needs so that they can reassess your need for medications and monitor your lab values. If you do not have a primary care physician, you can call [phone] for a physician referral.     Increase activity slowly    Complete by:  As directed            ALLERGIES:  Allergies  Allergen Reactions  . Carboplatin Shortness Of Breath, Swelling and Rash    Swelling of lips, rash on face,eyes and head     Discharge Medication List as of 04/15/2015 10:29 AM    START taking these medications   Details  levofloxacin (LEVAQUIN) 750 MG tablet Take 1 tablet (750 mg total) by mouth daily., Starting 04/14/2015, Until Discontinued, Normal    morphine (MSIR) 15 MG tablet Take 1 tablet (15 mg total) by mouth every 4 (four) hours  as needed for severe pain., Starting 04/15/2015, Until Discontinued, Print      CONTINUE these medications which have CHANGED   Details  aspirin EC 81 MG tablet Take 1 tablet (81 mg total) by mouth daily., Starting 04/15/2015, Until Discontinued, Print    morphine (MS CONTIN) 30 MG 12 hr tablet Take 1 tablet (30 mg total) by mouth every 12 (twelve) hours., Starting 04/15/2015, Until Discontinued, No Print    predniSONE (DELTASONE) 10 MG tablet Take 4 tablets twice daily for 3 days, then take 4 tablets once daily for 4 days, then take 2 tablets once daily for 4 days, then take 1 tablets once daily as before., Print      CONTINUE these medications which have NOT CHANGED   Details  albuterol (PROVENTIL) (2.5 MG/3ML) 0.083% nebulizer solution INHALE 1 VIAL IN NEBULIZER EVERY 4 HOURS AS NEEDED FOR WHEEZING OR SHORTNESS OF BREATH, Normal    ALPRAZolam (XANAX) 0.25 MG tablet Take 0.25 mg by mouth daily as needed., Starting 03/10/2014, Until Discontinued, Historical Med    ARIPiprazole (ABILIFY) 5 MG tablet Take 1 tablet (5 mg total) by mouth at bedtime., Starting 03/30/2013, Until Discontinued, Historical Med    Armodafinil (NUVIGIL) 150 MG tablet Take 150 mg by mouth daily., Until Discontinued, Historical Med    atorvastatin (LIPITOR) 20 MG tablet TAKE 1 TABLET (20 MG TOTAL) BY MOUTH DAILY., Normal    clonazePAM (KLONOPIN) 1 MG tablet Take 2 mg by mouth at bedtime as needed. For anxiety/insomnia, Until Discontinued, Historical Med    escitalopram (LEXAPRO) 20 MG tablet Take 1 tablet (20 mg total) by mouth daily., Starting 01/25/2015, Until Discontinued, Normal    esomeprazole ([place])  40 MG capsule Take 40 mg by mouth daily., Starting 06/20/2014, Until Discontinued, Historical Med    fluticasone (FLONASE) 50 MCG/ACT nasal spray Place 2 sprays into both nostrils daily., Starting 06/20/2014, Until Tue 06/20/15, Normal    Fluticasone-Salmeterol (ADVAIR DISKUS) 500-50 MCG/DOSE AEPB 1 puff then rinse  mouth, twice daily maintenance, Normal    Insulin Glargine (LANTUS SOLOSTAR) 100 UNIT/ML Solostar Pen 5 units at bedtime., Normal    Insulin Pen Needle (BD PEN NEEDLE NANO U/F) 32G X 4 MM MISC 1 each by Does not apply route daily., Starting 03/22/2014, Until Discontinued, Normal    isosorbide mononitrate (IMDUR) 60 MG 24 hr tablet Take 1 tablet (60 mg total) by mouth daily., Starting 11/11/2014, Until Discontinued, Normal    methocarbamol (ROBAXIN) 500 MG tablet Take 500 mg by mouth 2 (two) times daily as needed for muscle spasms. , Starting 10/27/2013, Until Discontinued, Historical Med    metoprolol succinate (TOPROL-XL) 25 MG 24 hr tablet TAKE 1/2 TABLET BY MOUTH DAILY, Normal    mirtazapine (REMERON) 45 MG tablet Take 1 tablet (45 mg total) by mouth at bedtime., Starting 12/29/2014, Until Discontinued, No Print    nitroGLYCERIN (NITROSTAT) 0.4 MG SL tablet Place 1 tablet (0.4 mg total) under the tongue every 5 (five) minutes as needed for chest pain., Starting 08/09/2014, Until Discontinued, Normal    nystatin (MYCOSTATIN) 100000 UNIT/ML suspension Take 5 mLs (500,000 Units total) by mouth 4 (four) times daily., Starting 01/25/2015, Until Discontinued, Normal    polyethylene glycol powder (GLYCOLAX/MIRALAX) powder Take as directed for colonoscopy., Normal    potassium chloride SA (K-DUR,KLOR-CON) 20 MEQ tablet Take 20 mEq by mouth daily., Until Discontinued, Historical Med    prochlorperazine (COMPAZINE) 10 MG tablet TAKE 1 TABLET BY MOUTH EVERY 6 HOURS AS NEEDED FOR NAUSEA AND VOMITING, Normal    theophylline (THEO-24) 400 MG 24 hr capsule Take 1 capsule (400 mg total) by mouth daily., Starting 02/24/2015, Until Discontinued, Phone In    Tiotropium Bromide-Olodaterol (STIOLTO RESPIMAT) 2.5-2.5 MCG/ACT AERS Inhale 2 puffs into the lungs daily., Starting 02/06/2015, Until Discontinued, Normal    VENTOLIN HFA 108 (90 BASE) MCG/ACT inhaler INHALE 2 PUFFS INTO THE LUNGS 4 TIMES DAILY AS NEEDED FOR  WHEEZING, Normal    VOLTAREN 1 % GEL Apply 2 g topically daily as needed (for pain). , Starting 06/24/2012, Until Discontinued, Historical Med      STOP taking these medications     gabapentin (NEURONTIN) 100 MG capsule        Follow-up Information    Follow up with Drema Pry, DO. Schedule an appointment as soon as possible for a visit in 1 week.   Specialty:  Internal Medicine   Why:  post hospitalization follow up. Talk to your PCP or your pain management specialist regarding your pain medications   Contact information:   [place] [address] [phone]       Follow up with Deneise Lever, MD.   Specialty:  Pulmonary Disease   Why:  go for appt this wednesday as scheduled   Contact information:   [place] [address] [phone]       Follow up with Minus Breeding, MD.   Specialty:  Cardiology   Why:  his office will call you for follow up   Contact information:   [name] [address] [phone]       [place]: 6 minutes  [facility] Pager  [phone]  04/15/2015, 1:59 PM

## 2015-04-15 NOTE — Discharge Instructions (Signed)

## 2015-04-17 LAB — CULTURE, BLOOD (ROUTINE X 2)
CULTURE: NO GROWTH
Culture: NO GROWTH

## 2015-04-18 DIAGNOSIS — J449 Chronic obstructive pulmonary disease, unspecified: Secondary | ICD-10-CM | POA: Diagnosis not present

## 2015-04-19 ENCOUNTER — Encounter: Payer: Self-pay | Admitting: Internal Medicine

## 2015-04-19 ENCOUNTER — Ambulatory Visit (INDEPENDENT_AMBULATORY_CARE_PROVIDER_SITE_OTHER): Payer: Commercial Managed Care - HMO | Admitting: Internal Medicine

## 2015-04-19 ENCOUNTER — Telehealth: Payer: Self-pay | Admitting: Internal Medicine

## 2015-04-19 VITALS — BP 124/64 | HR 81 | Ht 66.0 in | Wt 207.0 lb

## 2015-04-19 DIAGNOSIS — J9611 Chronic respiratory failure with hypoxia: Secondary | ICD-10-CM | POA: Diagnosis not present

## 2015-04-19 DIAGNOSIS — C3490 Malignant neoplasm of unspecified part of unspecified bronchus or lung: Secondary | ICD-10-CM | POA: Diagnosis not present

## 2015-04-19 DIAGNOSIS — J439 Emphysema, unspecified: Secondary | ICD-10-CM | POA: Diagnosis not present

## 2015-04-19 DIAGNOSIS — G4733 Obstructive sleep apnea (adult) (pediatric): Secondary | ICD-10-CM

## 2015-04-19 DIAGNOSIS — J42 Unspecified chronic bronchitis: Secondary | ICD-10-CM

## 2015-04-19 DIAGNOSIS — J449 Chronic obstructive pulmonary disease, unspecified: Secondary | ICD-10-CM

## 2015-04-19 NOTE — Patient Instructions (Signed)
Order- Huey Romans change CPAP to auto 5-12 cwp   Dx OSA              Continue O2 3L, mask of choice, humidifier, supplies             - [facility] - Patient needs adjustable/ hospital bed   Dx COPD, chtronic hypoxic respiratory failure, lung cancer              -[facility]- please add humidifier to home O2 concentrator    Dx COPD with chronic bronchitis

## 2015-04-19 NOTE — Assessment & Plan Note (Signed)
We need to get him comfortable using his CPAP again. Plan-education done. Try auto titration at lower pressure, 5-12. If this is not tolerated then we may need to change to BiPAP

## 2015-04-19 NOTE — Assessment & Plan Note (Signed)
Recent exacerbation now resolving. Sputum is white. Plan-hospital bed would help considerably

## 2015-04-19 NOTE — Progress Notes (Signed)
Patient ID: Ernest [name], male    DOB: 03-Mar-1956, 59 y.o.   MRN: [id number]  HPI 07/30/11- 40 yoM smoker followed for COPD/ bronchitis, OSA/ CPAP Last here 04/01/11- note reviewed. He had felt better on a trial of maintenance prednisone. He has felt more short of breath through the hot summer, with no acute event and without blood, chest pain or fever. Easy DOE around home. Has been sharing nebulizer with his wife, who now has a cold and we discussed this. Has been worse in last month, and expecially in recent rainy weather. Noting a little ankle swelling. Smothered trying to take a shower.  Only tolerating his CPAP about 3 nights/ week because he feels smothered, waking sore in anterior chest in the mornings.  Now down to only 2-3 cigarettes/ day and trying to stop.  CXR 08/14/10- Nl NAD PFT 05/26/08- FEV1/FVC 2.44/ 0.76, incr 19% w/ BD, R 0.59 ?DLCO 0.69  09/10/11- 55 yoM smoker followed for COPD/ bronchitis, OSA/ CPAP He reports feeling better "finally doing okay". He is down to just a couple of cigarettes a day. He is using CPAP regularly now and is comfortable with it. Alpha I antitrypsin Report came back normal "M. M." on 08/09/2011. Chest x-ray from 08/15/2011 showed stable changes of COPD. He has had flu vaccine.  11/12/11- 46 yoM smoker followed for COPD/ bronchitis, OSA/ CPAP Here with wife. Has had flu shot. He says he was doing well until 2 weeks ago. He again is coughing thick mucus which is difficult to clear. He depends on his metered inhaler and nebulizer to help clear his airways. Started Mucinex 3 days ago. Mucus is white. He denies fever, sore throat, chest pain, GI upset. Because of orthopnea he is sitting up to sleep for the past 2 nights.  01/14/12- 55 yoM smoker followed for COPD/ bronchitis, OSA/ CPAP Increase congested cough green to yellow sputum but he does not feel sick and denies fever. Sister who was a smoker recently died of lung cancer. Despite that and all of our  discussions, he still smokes a few cigarettes daily. We talked again about motivation to stop completely. Continues CPAP and Provigil one or 2 daily.  03/10/12  56 yoM smoker followed for COPD/ bronchitis, OSA/ CPAP Persistent bronchitic cough. Sputum is not purulent. He is "working on" his smoking habit but blames pollen for keeping him S. coughing now. Remains fully compliant with CPAP, all night every night. Still needs Provigil to help with residual daytime sleepiness as before.  05/12/12- 56 yoM smoker followed for COPD/ bronchitis, OSA/ CPAP Stays SOB all the time-even at rest; wheezing, coughing-productive-yellow in color, chest congestion, nasal congestion,. He is more interested today in smoking cessation which we discussed. He had quit for Daliresp after 10 days because of sustained nausea. He is taking all of his regular medicines and using his rescue inhaler several times a day. Off prednisone now for 1-2 months. CXR 01/23/12-  IMPRESSION:  No acute cardiopulmonary abnormality.  Original Report Authenticated By: Randall An, M.D.   07/13/12- 45 yoM smoker followed for COPD/ bronchitis, OSA/ CPAP, complicated by CAD Pt states increased sob,wheezing,productive cough x3 months. Still smoking with no serious effort to stop. His sister has lung cancer which has gotten his attention and he may be willing to try harder. Some days his breathing is better than others with no real trend. Coughing productive of white sputum with no blood. He has all of his medications currently.  07/30/12- 10 yoM  smoker followed for COPD/ bronchitis, OSA/ CPAP, complicated by CAD Cough-productive-yellow and thick; SOB, wheezing, chest congestion, head congestion x 1 week; O2 sat of 83% RA entered room-20 seconds RA sat of 90% CT 07/21/12-  IMPRESSION:  1. Tiny bilateral lung nodules which are similar to on the prior  exam, given differences in slice selection and measurement error.  Likely subpleural lymph  nodes.  2. Age advanced coronary artery atherosclerosis. Recommend  assessment of coronary risk factors and consideration of medical  therapy.  3. Slight enlargement of splenic lesion since 2007. Favored to  represent a hemangioma or lymphangioma.  Original Report Authenticated By: Areta Haber, M.D.   08/07/12- 55 yoM smoker followed for COPD/ bronchitis, OSA/ CPAP, complicated by CAD, disability for back pain. Cough-productive-white mostly; SOB , wheezing-worse at night     wife here Coughing spasms he can feel his upper airway is closing off. He he still smokes a little even when he feels badly and we went over this again today offering support.  11/13/12- 56 yoM smoker followed for COPD/ bronchitis, OSA/ CPAP, complicated by CAD, disability for back pain. FOLLOWS RCV:ELFYB still but not productive-more SOB  Went to ER in September or acute exacerbation of COPD but not admitted. Today he is a scheduled visit. He actually says he is doing pretty well with no acute problems. Perennial nasal congestion. He has reduced his cigarette smoking to about 2 per day and is strongly encouraged to stop completely now.  01/25/13-  57 yoM smoker followed for COPD/ bronchitis, OSA/ CPAP, complicated by CAD, disability for back pain. ACUTE VISIT: started smoking agian-discuss taking Chantix; feels raw in chest area with deep cough x 2 weeks. He feels well at this visit, meaning he is at his baseline some daily nonproductive cough and shortness of breath with exertion.  05/17/13-57 yoM smoker followed for COPD/ bronchitis, OSA/ CPAP, complicated by CAD, disability for back pain. FOLLOWS OFB:PZWCHENID is about the same as last visit; has lessened the about the amount he is smoking. Chantix helps. Continues CPAP/ 10/[facility] Discussed smoking cessation effort.  06/10/13- 57 yoM smoker followed for COPD/ bronchitis, OSA/ CPAP, complicated by CAD, disability for back pain. ACUTE VISIT: prod cough with yellow/green  mucus, increased SOB, wheezing, tightness in chest, chills x1 week.  still on augmentin and pred taper from 7/15 phone note > no better Trying Chantix. Wife is also sick. There exposed to his brother's child who has frequent colds. Just started prednisone taper and Augmentin yesterday. Had teeth pulled.  09/16/13- 57 yoM smoker followed for COPD/ bronchitis, OSA/ CPAP, complicated by CAD, disability for back pain. FOLLOWS FOR: was given the flu shot about 1 month and has not felt good since; continues to keep a headache, chest congestion at night. Describes frontal headache, head and chest congestion since a flu shot. Using Chantix, has reduced smoking to 2 or 3 cigarettes daily. Denies infection-no fever, sputum not purulent or bloody. No chest pain. Stays on Mucinex. Not using CPAP because of head congestion. Sister has died of lung cancer/smoker.  10/21/2013- 57 yoM smoker followed for COPD/ bronchitis, OSA/ CPAP, SqCell CA LUL,complicated by CAD, disability for back pain. FOLLOWS FOR: review bx results with patient; continues to have bad frontal lobe headache and lots of congestion. Nausea last night. No vomiting. Abnl CXR > CT chest/ large LUL mass to hilum. We had notified pt and scheduled bx. He is not coughing up much, denies chest pain. New headache mostly left frontal and  retro-orbital x 1 month with no lateralizing neuro. Remains tight on routine meds, off prednisone.  His wife blames "anxiety" for ER trip/ nebulizer the night before his bronchoscopy. Sister recently died of small cell Ca lung.  CT chest 09/17/13- IMPRESSION:  1. 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 This is associated with  some postobstructive changes in the left upper lobe as well as left  hilar and AP window lymphadenopathy. Based on these findings, and  assuming lack of distant metastatic disease (which will have to be  proven by other imaging  studies), this is favored to represent at  least T2b (if not T3 because of satellite nodules in the left upper  lobe), N2, Mx disease (i.e., at least stage IIIA disease). Further  evaluation with PET-CT and brain MRI with and without IV gadolinium  is suggested at this time for complete staging.  2. Mild centrilobular and paraseptal emphysema.  3. Atherosclerosis, including 3 vessel coronary artery disease.  Assessment for potential risk factor modification, dietary therapy  or pharmacologic therapy may be warranted, if clinically indicated.  4. Additional incidental findings, as above.  Electronically Signed  By: Vinnie Langton M.D.  On: 09/22/2013 11:34  Bronch / needle bx 10/04/13- by Dr Lamonte Sakai Pos Squamous Cell Ca  1/23//15- 57 yoM former smoker followed for COPD/ bronchitis, OSA/ CPAP, SqCell CA LUL/ XRT/Chemo, complicated by CAD, disability for back pain. Myocardial Perfusion 11/19/13- EF 65%, Nl wall motion, no ischemic changes. Follows for: frontal lobe headaches have gone away, SOB constantly, some prod cough with white/clear mucous.  Not smoking.  Nearing completion of current rounds of Chemo/ XRT.  Minor sore throat, breathing ok- some SOB, dry cough.  No recent steroids or ABX except Advair.  Continues CPAP 10/ [facility] with no problem.   01/06/13- 57 yoM former smoker followed for COPD/ bronchitis, OSA/ CPAP, SqCell CA LUL/ XRT/Chemo, complicated by CAD, disability for back pain. ACUTE VISIT:  Increase sob, wheezing and cough with congestion head and chest. Also, has bodyaches and sweats Acute illness x1 week started as head congestion moving to his chest with fever, white sputum turning yellow, myalgias. No blood. Had had flu shot. Has completed radiation and chemotherapy pending next oncology followup. Admits smoking 2 cigarettes since last here.  03/02/14- 67 yoM some-time smoker followed for COPD/ bronchitis, OSA/ CPAP, SqCell CA LUL/ XRT/Chemo, complicated by CAD, disability  for back pain. FOLLOWS FOR: Post hospital; Pt states that continues to have congestion, wheezing, and having left sided chest pain this morning. Had reaction to chemo yesterday-started having trouble breathing and hot flashes-was given breathing tx and injection. Hospitalized in with pneumonia, COPD with asthma 2 weeks ago. CT chest 01/28/2014 showed his left upper lobe mass to be cavitating. Yesterday he had reaction to chemotherapy requiring nebulizer treatment and injection. More dyspnea on exertion at baseline, gradually worse over the past month. Coughing clear phlegm. Nebulizer helps temporarily. This morning head left upper anterior lateral chest pain which is gradually fading. He admits he is smoking some-discussed. Continues CPAP 10/[facility] CXR 02/15/14 IMPRESSION:  No acute findings and no change from the study performed earlier  this same day.  Electronically Signed  By: Lajean Manes M.D.  On: 02/15/2014 09:23  04/19/14- 7 yoM some-time smoker followed for COPD/ bronchitis, OSA/ CPAP, SqCell CA LUL/ XRT/Chemo, complicated by CAD, disability for back pain, DM. FOLLOWS FOR: Breathing is terrible. Recently had PNA-Dr Yoo-still on abx  for this. CPAP 10/ O2 2L/ [facility] for sleep Walk test today 3x 180 m on room air- lowest sat was 92%. Scheduled for 3 more days of Levaquin for pneumonia. Improving significantly now. Cough is productive white. CT chest 04/12/14 IMPRESSION:  1. Favor response to therapy of a residual cavitary left upper lobe  lung lesion. Decreased size of the cavitary component with increased  surrounding soft tissue thickening, favored to be treatment related.  Similarly, evolving radiation change within the surrounding left  upper lobe and left apex. The apical presumed radiation change  obscures the previously described left apical pulmonary nodule.  Recommend attention on follow-up.  2. Similar borderline prevascular adenopathy.  3. Resolved left-sided pleural effusion  with trace left pleural  fluid remaining.  4. Minimal left lower lobe nodularity which could be infectious or  inflammatory. Recommend attention on follow-up.  5. Pulmonary artery enlargement suggests pulmonary arterial  hypertension.  6. Incompletely imaged similar cystic/septated splenic lesion. Favor  a benign lesion such as a lymphangioma.  Electronically Signed  By: Abigail Miyamoto M.D.  On: 04/12/2014 13:57  07/19/14- 13 yoM some-time smoker followed for COPD/ bronchitis, OSA/ CPAP, SqCell CA LUL/ XRT/Chemo, complicated by CAD, disability for back pain, DM.    Wife here FOLLOWS FOR: has had choking cough since last here-productive and clear in color. SOB and wheezing. He says cough is not new and not changed. No blood. Occasional localized left parasternal pain lasts for a day or 2 at a time. He is using rescue inhaler too frequently and we discussed use of maintenance controller and nebulizer machine more appropriately. Takes occasional Nuvigil but still needs to nap. Continues Nexium without recognizing much heartburn.  10/19/14- 58 yoM former smoker followed for COPD/ bronchitis, OSA/ CPAP, SqCell CA LUL/ XRT/Chemo, complicated by CAD, disability for back pain, DM.     FOLLOWS FOR: Increased SOB and wheezing, Chest congestion-unable to cough anything up, chills as well x 2 weeks, no fever, blood, or purulent. Sleeps propped up recliner. Neb 4-5x/ day, frequent rescue inhaler. Sleep O2 2l/ [facility] with CPAP 10 are ok used every night. Needs O2 day/ portable Walk test today - 85% on RA at rest, corrected w O2.. CT 07/19/14 IMPRESSION: Decreased size of cavitary component of left upper lobe mass lesion corresponding to the reported history of the lung cancer. The degree of adjacent volume loss and masslike consolidation of the left upper lobe is increased since previously which may reflect treatment effect secondary to radiation fibrosis. This is amenable to followup and continued  surveillance. No new evidence for intrathoracic metastatic disease. Electronically Signed  By: Conchita Paris M.D.  On: 07/19/2014 14:06  11/15/2014 Acute OV  Complains of cough, congestion , wheezing , dyspnea and tightness for last 3 weeks. Was called in prednisone taper last week  with only minimal improvement . Coughing up thick mucus but unable to get anything up .  Was seen by PCP initially tx w/ Doxycycline on 10/26/14  Seen by cardiology last week , Imdur increased for chest tightness.  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 tx w/ chemo and XRT  CT on 10/21/14 showed stable LUL lesion and increased aspdz in LLL likely from radiation changes. New patchy opacity in RLL suspicious for infection/inflammation.  No evidence of lymphadenopathy.  Pt is followed by [name] and has repeat CT chest planned  in 12/2014.  rec Begin Levaquin 500 mg daily for 7 days Mucinex DM twice daily as needed for cough and congestion Prednisone taper over the next week Fluids and rest  12/15/2014  Acute  ov/Wert re: refractory cough/ wheeze across  the room  No better p pred/levaquin  Chief Complaint  Patient presents with  . Acute Visit    Pt c/o cough and increased SOB for the past month. He has prod cough with minimal white sputum. He states that he is SOB with any exertion- using albuterol inhaler 3-4 x per day and neb with albuterol 5 x per day.   sob at rest is better  when on 3lpm  Walking on up 3lpm  Sleeping about 30 degrees in recliner 3lpm but using neb 3 x nightly and only a little better transiently, no resp to rob with codie, no purulent sputum or hemoptysis gen ant cp with coughing only  CXR PA and Lateral:   12/15/2014 :     I personally reviewed images and agree with radiology impression as follows:    Stable chronic  consolidation and postradiation changes in left upper  lobe. No definite superimposed infiltrate or pulmonary edema.   01/19/15- 31 yoM former smoker followed for COPD/ bronchitis, chronic hypoxic Resp Failure, OSA/ CPAP, SqCell CA LUL/ XRT/Chemo, complicated by CAD, disability for back pain, DM.  FOLLOWS FOR: Pt has restarted Theophylline ER '200mg'$  1 tablet BID and seems to be helping his breathing; would like to have Rx for this. CPAP 10/ O2 3 L/ [facility]  Next CT sched in March He struggles for breathing comfort now at baseline without a distinct acute event recently. He asks to increase the strength of his Advair.  04/19/15-59 yoM former smoker followed for COPD/ bronchitis, chronic hypoxic Resp Failure, OSA/ CPAP, SqCell CA LUL/ XRT/Chemo, complicated by CAD, disability for back pain, DM.  Reports: PT. states COPD is about the same still experiencing wheezing and SOB, pollen is a problem.  Hosp f/u 5/17-5/21- [facility] w  Pneumonia. Now much improved. CPAP 10/ [facility] 3L O2 made him feel smothered so he is not wearing it. Asks hospital bed. He needs an adjustable bed and wife was not strong enough to help him. He is significantly limited by muscle weakness and dyspnea on exertion related to his lung disease and lung cancer. CT chest 04/14/15-image reviewed IMPRESSION: 1. Stable appearance of left upper lobe over multiple prior studies, consistent with post treatment changes. 2. New infiltrate within the right upper lobe and left lower lobe, likely infectious. 3. Emphysematous changes. 4. Stable splenic lesion. Electronically Signed  By: Nolon Nations M.D.  On: 04/14/2015 20:23   ROS-see HPI   Negative unless "+" Constitutional:    weight loss, night sweats, fevers, chills, fatigue, lassitude. HEENT:    headaches, difficulty swallowing, tooth/dental problems, sore throat,       sneezing, itching, ear ache, nasal congestion, post nasal drip, snoring CV:    chest pain, orthopnea, PND,  swelling in lower extremities, anasarca,                                  dizziness, palpitations Resp:   +shortness of breath with exertion or at rest.                +productive cough,   +non-productive cough, coughing up of blood.  change in color of mucus.  +wheezing.   Skin:    rash or lesions. GI:  No-   heartburn, indigestion, abdominal pain, nausea, vomiting,  GU:  MS:   joint pain, stiffness,  Neuro-     nothing unusual Psych:  change in mood or affect.  depression or anxiety.   memory loss.     Objective:   OBJ- Physical Exam General- Alert, Oriented, Affect-appropriate, Distress- none acute, + chronically ill    appears comfortable sitting quietly on room air Skin- rash-none, lesions- none, excoriation- none Lymphadenopathy- none Head- atraumatic            Eyes- Gross vision intact, PERRLA, conjunctivae and secretions clear            Ears- Hearing, canals-normal            Nose- Clear, no-Septal dev, mucus, polyps, erosion, perforation             Throat- Mallampati II , mucosa clear , drainage- none, tonsils- atrophic, + hoarse Neck- flexible , trachea midline, no stridor , thyroid nl, carotid no bruit Chest - symmetrical excursion , unlabored           Heart/CV- RRR , no murmur , no gallop  , no rub, nl s1 s2                           - JVD- none , edema- none, stasis changes- none, varices- none           Lung- + distant, wheeze + faint, cough- none , dullness-none, rub- none           Chest wall-  Abd-  Br/ Gen/ Rectal- Not done, not indicated Extrem- cyanosis- none, clubbing, none, atrophy- none, strength- nl Neuro- grossly intact to observation

## 2015-04-19 NOTE — Telephone Encounter (Signed)
Error

## 2015-04-21 ENCOUNTER — Telehealth: Payer: Self-pay | Admitting: Internal Medicine

## 2015-04-21 MED ORDER — DOXYCYCLINE HYCLATE 100 MG PO TABS: 100.0000 mg | ORAL_TABLET | Freq: Two times a day (BID) | ORAL | Status: DC

## 2015-04-21 NOTE — Telephone Encounter (Signed)
Allergies  Allergen Reactions  . Carboplatin Shortness Of Breath, Swelling and Rash    Swelling of lips, rash on face,eyes and head     Pt's wife called stating that starting yesterday the pt got a sore throat and has a prod cough w/ green mucus. They are requesting an abx to be called in to the pharmacy. You seen pt on 04/19/15 for chronic resp failure w/hypoxia.  CY please advise.

## 2015-04-21 NOTE — Telephone Encounter (Signed)
Spoke with pt's wife to inform her that we are sending Doxycycline '100mg'$  BID x 7 days per CY. She agreed. Nothing further needed at this time.

## 2015-04-21 NOTE — Telephone Encounter (Signed)
Offer doxycycline 100 mg, # 14, 1 twice daily 

## 2015-04-24 DIAGNOSIS — J449 Chronic obstructive pulmonary disease, unspecified: Secondary | ICD-10-CM | POA: Diagnosis not present

## 2015-04-24 DIAGNOSIS — J9601 Acute respiratory failure with hypoxia: Secondary | ICD-10-CM | POA: Diagnosis not present

## 2015-04-24 DIAGNOSIS — C342 Malignant neoplasm of middle lobe, bronchus or lung: Secondary | ICD-10-CM | POA: Diagnosis not present

## 2015-04-25 DIAGNOSIS — C3490 Malignant neoplasm of unspecified part of unspecified bronchus or lung: Secondary | ICD-10-CM | POA: Diagnosis not present

## 2015-04-25 DIAGNOSIS — J449 Chronic obstructive pulmonary disease, unspecified: Secondary | ICD-10-CM | POA: Diagnosis not present

## 2015-04-28 ENCOUNTER — Ambulatory Visit: Payer: Commercial Managed Care - HMO | Admitting: Internal Medicine

## 2015-04-29 ENCOUNTER — Other Ambulatory Visit: Payer: Self-pay | Admitting: Internal Medicine

## 2015-05-04 ENCOUNTER — Encounter: Payer: Commercial Managed Care - HMO | Admitting: Gastroenterology

## 2015-05-04 DIAGNOSIS — J449 Chronic obstructive pulmonary disease, unspecified: Secondary | ICD-10-CM | POA: Diagnosis not present

## 2015-05-08 ENCOUNTER — Ambulatory Visit: Payer: Commercial Managed Care - HMO | Admitting: Internal Medicine

## 2015-05-08 ENCOUNTER — Ambulatory Visit (INDEPENDENT_AMBULATORY_CARE_PROVIDER_SITE_OTHER): Payer: Commercial Managed Care - HMO | Admitting: Internal Medicine

## 2015-05-08 ENCOUNTER — Encounter: Payer: Self-pay | Admitting: Internal Medicine

## 2015-05-08 VITALS — BP 130/80 | HR 87 | Temp 98.6°F | Resp 20 | Ht 66.0 in | Wt 207.0 lb

## 2015-05-08 DIAGNOSIS — I10 Essential (primary) hypertension: Secondary | ICD-10-CM

## 2015-05-08 DIAGNOSIS — J441 Chronic obstructive pulmonary disease with (acute) exacerbation: Secondary | ICD-10-CM | POA: Diagnosis not present

## 2015-05-08 DIAGNOSIS — G894 Chronic pain syndrome: Secondary | ICD-10-CM

## 2015-05-08 DIAGNOSIS — J189 Pneumonia, unspecified organism: Secondary | ICD-10-CM

## 2015-05-08 DIAGNOSIS — E119 Type 2 diabetes mellitus without complications: Secondary | ICD-10-CM | POA: Diagnosis not present

## 2015-05-08 HISTORY — DX: Type 2 diabetes mellitus without complications: E11.9

## 2015-05-08 MED ORDER — METFORMIN HCL ER (MOD) 500 MG PO TB24: 1000.0000 mg | ORAL_TABLET | Freq: Every day | ORAL | Status: DC

## 2015-05-08 NOTE — Progress Notes (Signed)
Pre visit review using our clinic review tool, if applicable. No additional management support is needed unless otherwise documented below in the visit note. 

## 2015-05-08 NOTE — Progress Notes (Signed)
Subjective:    Patient ID: Ernest [name], male    DOB: [birth year], 59 y.o.   MRN: [id number]  HPI Admit date: 04/11/2015 Discharge date: 04/15/2015  PCP: Drema Pry, DO  DISCHARGE DIAGNOSES:  Principal Problem:  COPD with exacerbation Active Problems:  Depression with anxiety  Obstructive sleep apnea  Essential hypertension  CORONARY ATHEROSCLEROSIS NATIVE CORONARY ARTERY  COPD with asthma  Squamous cell lung cancer  Chronic respiratory failure with hypoxia  Respiratory failure  Acute respiratory failure with hypercapnia  CAP (community acquired pneumonia)  Chronic pain syndrome  Palliative care encounter   RECOMMENDATIONS FOR OUTPATIENT FOLLOW UP: 1. Patient to discuss possibility of getting BiPAP at home with his pulmonologist 2. Cardiology office will call with appointment 3. Platelet counts decreased with heparin. Heparin was stopped. Counts are stable. Please follow as outpatient. His pain medication regimen has been adjusted.  Lab Results  Component Value Date   HGBA1C 6.1 07/18/2014    59 year old patient with a complex past medical history.  He has had a recent hospital admission and has been seen in pulmonary follow-up.  He is fairly stable but still not quite back to baseline. He does have type 2 diabetes.  Presently on low-dose Lantus at bedtime.  He states that he wishes to get off insulin if possible.  Apparently has never been on any oral medications.  Last hemoglobin A1c was 10 months ago and was 6.1  Past Medical History  Diagnosis Date  . CAD (coronary artery disease)     Left Main 30% stenosis, LAD 20 - 30 % stenosis, first and second diagonal branchesat 40 - 50%  stenosis with small arteries, circumflex had 30% stenosis in the large obtuse marginal, RCA at 70 - 80%  stenosis [not felt to be occlusive after evaluation with flow wire], distal 50 - 60% stenosis - [name][  . COPD (chronic obstructive pulmonary disease)     Dr.  Baird Lyons  . Depression   . Anxiety   . Hyperlipidemia   . Chronic insomnia   . Gout   . GERD (gastroesophageal reflux disease)   . PVD (peripheral vascular disease)     (ABI 0.9 on right and 0.89 on left)  severe left external iliac stenosis.  He  had successful stenting of his left external iliac per Dr. Burt Knack.  . DDD (degenerative disc disease)   . Hx of colonoscopy   . 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 OSA (obstructive sleep apnea)     NPSG 09/10/10- AHI 11.3/hr  . Hypertension     dr Percival Spanish  . History of radiation therapy 11/10/13- 12/29/13    left lung 6600 cGy in 33 sessions  . Cancer     lung  . Lung cancer 10/04/13    LUL squamous cell lung cancer  . Diabetes mellitus without complication 02/18/7123    History   Social History  . Marital Status: Married    Spouse Name: N/A  . Number of Children: N/A  . Years of Education: N/A   Occupational History  . Disabled welder    Social History Main Topics  . Smoking status: Former Smoker -- 0.50 packs/day for 43 years    Types: Cigarettes    Quit date: 09/23/2013  . Smokeless tobacco: Never Used     Comment: history of 3 PPD, 11/02/13   . Alcohol Use: No  . Drug Use: No  . Sexual Activity: No   Other Topics Concern  . Not on file  Social History Narrative    Past Surgical History  Procedure Laterality Date  . Spinal fusion  03/05/2007    L4-L5  . Hip surgery      left  . Arm surgery      left  . Shoulder surgery      right  . C-spine surgery    . Angioplasty    . Video bronchoscopy with endobronchial navigation N/A 10/04/2013    Procedure: VIDEO BRONCHOSCOPY WITH ENDOBRONCHIAL NAVIGATION;  Surgeon: Collene Gobble, MD;  Location: [facility];  Service: Thoracic;  Laterality: N/A;  . Back surgery      Family History  Problem Relation Age of Onset  . Heart attack Mother   . Heart attack Sister   . Lung cancer Sister   . Cancer Sister     small cell lung, mets to brain  . Emphysema Sister   .  Hypertension Brother     Allergies  Allergen Reactions  . Carboplatin Shortness Of Breath, Swelling and Rash    Swelling of lips, rash on face,eyes and head    Current Outpatient Prescriptions on File Prior to Visit  Medication Sig Dispense Refill  . albuterol (PROVENTIL) (2.5 MG/3ML) 0.083% nebulizer solution INHALE 1 VIAL IN NEBULIZER EVERY 4 HOURS AS NEEDED FOR WHEEZING OR SHORTNESS OF BREATH 300 mL 11  . ALPRAZolam (XANAX) 0.25 MG tablet Take 0.25 mg by mouth daily as needed.    . ARIPiprazole (ABILIFY) 5 MG tablet Take 1 tablet (5 mg total) by mouth at bedtime.    . Armodafinil (NUVIGIL) 150 MG tablet Take 150 mg by mouth daily.    [name] aspirin EC 81 MG tablet Take 1 tablet (81 mg total) by mouth daily. 30 tablet 0  . atorvastatin (LIPITOR) 20 MG tablet TAKE 1 TABLET (20 MG TOTAL) BY MOUTH DAILY. 90 tablet 3  . BD PEN NEEDLE NANO U/F 32G X 4 MM MISC 1 EACH BY DOES NOT APPLY ROUTE DAILY. 100 each 3  . clonazePAM (KLONOPIN) 1 MG tablet Take 2 mg by mouth at bedtime as needed. For anxiety/insomnia    . escitalopram (LEXAPRO) 20 MG tablet Take 1 tablet (20 mg total) by mouth daily. 90 tablet 0  . esomeprazole (NEXIUM) 40 MG capsule Take 40 mg by mouth daily.    . fluticasone (FLONASE) 50 MCG/ACT nasal spray Place 2 sprays into both nostrils daily. 16 g 3  . Fluticasone-Salmeterol (ADVAIR DISKUS) 500-50 MCG/DOSE AEPB 1 puff then rinse mouth, twice daily maintenance 60 each 11  . Insulin Glargine (LANTUS SOLOSTAR) 100 UNIT/ML Solostar Pen 5 units at bedtime. 5 pen PRN  . isosorbide mononitrate (IMDUR) 60 MG 24 hr tablet Take 1 tablet (60 mg total) by mouth daily. 90 tablet 3  . levofloxacin (LEVAQUIN) 750 MG tablet Take 1 tablet (750 mg total) by mouth daily. 7 tablet 0  . methocarbamol (ROBAXIN) 500 MG tablet Take 500 mg by mouth 2 (two) times daily as needed for muscle spasms.     . metoprolol succinate (TOPROL-XL) 25 MG 24 hr tablet TAKE 1/2 TABLET BY MOUTH DAILY 30 tablet 5  . mirtazapine  (REMERON) 45 MG tablet Take 1 tablet (45 mg total) by mouth at bedtime.    [name] morphine (MS CONTIN) 30 MG 12 hr tablet Take 1 tablet (30 mg total) by mouth every 12 (twelve) hours.    [name] morphine (MSIR) 15 MG tablet Take 1 tablet (15 mg total) by mouth every 4 (four) hours as needed for severe  pain. 30 tablet 0  . nitroGLYCERIN (NITROSTAT) 0.4 MG SL tablet Place 1 tablet (0.4 mg total) under the tongue every 5 (five) minutes as needed for chest pain. 25 tablet 3  . nystatin (MYCOSTATIN) 100000 UNIT/ML suspension Take 5 mLs (500,000 Units total) by mouth 4 (four) times daily. 60 mL 1  . polyethylene glycol powder (GLYCOLAX/MIRALAX) powder Take as directed for colonoscopy. 255 g 0  . potassium chloride SA (K-DUR,KLOR-CON) 20 MEQ tablet Take 20 mEq by mouth daily.    . predniSONE (DELTASONE) 10 MG tablet Take 4 tablets twice daily for 3 days, then take 4 tablets once daily for 4 days, then take 2 tablets once daily for 4 days, then take 1 tablets once daily as before. 60 tablet 0  . prochlorperazine (COMPAZINE) 10 MG tablet TAKE 1 TABLET BY MOUTH EVERY 6 HOURS AS NEEDED FOR NAUSEA AND VOMITING 60 tablet 3  . theophylline (THEO-24) 400 MG 24 hr capsule Take 1 capsule (400 mg total) by mouth daily. 30 capsule 5  . Tiotropium Bromide-Olodaterol (STIOLTO RESPIMAT) 2.5-2.5 MCG/ACT AERS Inhale 2 puffs into the lungs daily. 1 Inhaler 5  . VENTOLIN HFA 108 (90 BASE) MCG/ACT inhaler INHALE 2 PUFFS INTO THE LUNGS 4 TIMES DAILY AS NEEDED FOR WHEEZING 18 Inhaler 11  . VOLTAREN 1 % GEL Apply 2 g topically daily as needed (for pain).      No current facility-administered medications on file prior to visit.    BP 130/80 mmHg  Pulse 87  Temp(Src) 98.6 F (37 C) (Oral)  Resp 20  Ht '5\' 6"'$  (1.676 m)  Wt 207 lb (93.895 kg)  BMI 33.43 kg/m2  SpO2 96%     Review of Systems  Constitutional: Positive for appetite change and fatigue. Negative for fever and chills.  HENT: Positive for voice change. Negative for  congestion, dental problem, ear pain, hearing loss, sore throat, tinnitus and trouble swallowing.   Eyes: Negative for pain, discharge and visual disturbance.  Respiratory: Positive for shortness of breath and wheezing. Negative for cough, chest tightness and stridor.   Cardiovascular: Negative for chest pain, palpitations and leg swelling.  Gastrointestinal: Negative for nausea, vomiting, abdominal pain, diarrhea, constipation, blood in stool and abdominal distention.  Genitourinary: Negative for urgency, hematuria, flank pain, discharge, difficulty urinating and genital sores.  Musculoskeletal: Negative for myalgias, back pain, joint swelling, arthralgias, gait problem and neck stiffness.  Skin: Negative for rash.  Neurological: Positive for weakness. Negative for dizziness, syncope, speech difficulty, numbness and headaches.  Hematological: Negative for adenopathy. Does not bruise/bleed easily.  Psychiatric/Behavioral: Negative for behavioral problems and dysphoric mood. The patient is not nervous/anxious.        Objective:   Physical Exam  Constitutional: He is oriented to person, place, and time. He appears well-developed.  Weight 207 Blood pressure normal, afebrile O2 saturation 96   HENT:  Head: Normocephalic.  Right Ear: External ear normal.  Left Ear: External ear normal.  Dentures in place  Eyes: Conjunctivae and EOM are normal.  Neck: Normal range of motion.  Cardiovascular: Normal rate and normal heart sounds.   Pulmonary/Chest: No respiratory distress.  Prolonged expiratory phase A few scattered end-expiratory wheezes Coarse breath sounds present, involving the right lower lung field  Abdominal: Bowel sounds are normal.  Musculoskeletal: Normal range of motion. He exhibits no edema or tenderness.  Neurological: He is alert and oriented to person, place, and time.  Psychiatric: He has a normal mood and affect. His behavior is normal.  Assessment & Plan:    COPD CAD OSA History of squamous cell lung cancer Essential hypertension, stable Diabetes.  He is asking about the possibility of discontinuation of insulin.  Will place on metformin 1000 mg daily and check a hemoglobin A1c.  If this remains in a nondiabetic range.  We'll discontinue insulin therapy  Recheck 3 months Follow-up multiple consultants

## 2015-05-08 NOTE — Patient Instructions (Signed)
Limit your sodium (Salt) intake  You need to lose weight.  Consider a lower calorie diet and regular exercise.  Return in 3 months for follow-up    Please check your hemoglobin A1c every 3 months

## 2015-05-09 LAB — HEMOGLOBIN A1C: Hgb A1c MFr Bld: 6.9 % — ABNORMAL HIGH (ref 4.6–6.5)

## 2015-05-12 ENCOUNTER — Other Ambulatory Visit: Payer: Self-pay | Admitting: *Deleted

## 2015-05-12 MED ORDER — METFORMIN HCL ER 500 MG PO TB24: 1000.0000 mg | ORAL_TABLET | Freq: Every day | ORAL | Status: DC

## 2015-05-15 ENCOUNTER — Encounter (HOSPITAL_COMMUNITY): Payer: Self-pay | Admitting: *Deleted

## 2015-05-16 NOTE — Progress Notes (Signed)
Cardiology Office Note   Date:  05/17/2015   ID:  DOUGLASS DUNSHEE, DOB 23-Jul-1956, MRN [id number]  PCP:  Drema Pry, DO  Cardiologist:  Dr. Minus Breeding     Chief Complaint  Patient presents with  . Hospitalization Follow-up    Elevated Troponin; a/c Resp Failure  . Coronary Artery Disease     History of Present Illness: Ernest [name] is a 59 y.o. male with a hx of nonobstructive CAD by catheterization in 2011, PAD with prior left iliac stenting, HTN, HL, COPD with chronic respiratory failure, OSA, gout, anxiety/depression, GERD, tobacco abuse, non-small cell lung CA (squamous cell) stage III a status post chemotherapy/radiation. Last same by Dr. Percival Spanish 10/2014.  Admitted 5/17-5/21 with acute on chronic, ventilator dependent, hypercarbic and hypoxemic respiratory failure in the setting of AECOPD.  He was treated with a combination of inhalers/nebulizers, antibiotics and steroids.  He was followed by CCM.  with his history of chronic low back and neck pain, he was seen by palliative medicine to assist with pain management. His pain medication regimen was adjusted. Instead off Percocet he was asked to take short acting morphine.  Hospital stay complicated by minimally elevated Troponin.  Peak troponin of 0.63.  Echo demonstrated normal LVF, mild MR, mild BAE.  There were no WMA.   He was not evaluated by cardiology.    Recent Labs  04/11/15 0240 04/11/15 0524 04/11/15 0846 04/11/15 1249 04/11/15 1852 04/12/15 0224  TROPIPOC 0.02  --   --   --   --   --   TROPONINI  --  0.38* 0.45* 0.63* 0.42* 0.25*    He returns for FU.  He continues to have difficulty with his breathing. He is NYHA 3. He sleeps on a 45 incline. This is chronic without change. He sleeps with CPAP. He tells me he has quit smoking. He has been noticing chest discomfort for the past few weeks. He is actually having some right now. It is 4/10.  It is no worse today than it has been.  This is a  left-sided pressure. It seems to be brought on with positional changes more than anything else. He does have a lot of neck problems and can reproduce symptoms by moving his head. He also has symptoms with activity and increased shortness of breath. His cough is productive of clear to white sputum. He denies any significant worsening in his wheezing. He denies syncope.   Studies/Reports Reviewed Today:  Echo 04/13/15 - EF 55%to 60%. Wall motion was normal;   - Mitral valve: There was mild regurgitation. - Left atrium: The atrium was mildly dilated. - Right ventricle: The cavity size was mildly dilated. Wallthickness was normal. - Right atrium: The atrium was mildly dilated.  Myoview 11/17/13 Normal stress nuclear study. LV Ejection Fraction: 65%. LV Wall Motion: NL LV Function; NL Wall Motion  LHC 06/2010 LM: Normal LAD: Proximal and mid 25% LCx: AV groove 25-30% RCA: Proximal 50%, mid 70%, 50% after AM EF 65%  Past Medical History  Diagnosis Date  . CAD (coronary artery disease)     Left Main 30% stenosis, LAD 20 - 30 % stenosis, first and second diagonal branchesat 40 - 50%  stenosis with small arteries, circumflex had 30% stenosis in the large obtuse marginal, RCA at 70 - 80%  stenosis [not felt to be occlusive after evaluation with flow wire], distal 50 - 60% stenosis - [name][  . COPD (chronic obstructive pulmonary disease)  Dr. Baird Lyons  . Depression   . Anxiety   . Hyperlipidemia   . Chronic insomnia   . Gout   . GERD (gastroesophageal reflux disease)   . PVD (peripheral vascular disease)     (ABI 0.9 on right and 0.89 on left)  severe left external iliac stenosis.  He  had successful stenting of his left external iliac per Dr. Burt Knack.  . DDD (degenerative disc disease)   . Hx of colonoscopy   . 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 OSA (obstructive sleep apnea)     NPSG 09/10/10- AHI 11.3/hr  . Hypertension     dr Percival Spanish  . History of radiation  therapy 11/10/13- 12/29/13    left lung 6600 cGy in 33 sessions  . Cancer     lung  . Lung cancer 10/04/13    LUL squamous cell lung cancer  . Diabetes mellitus without complication 05/20/9484    Past Surgical History  Procedure Laterality Date  . Spinal fusion  03/05/2007    L4-L5  . Hip surgery      left 'bone graft taken"  . Arm surgery      left elbow  . Shoulder surgery      right  . C-spine surgery    . Angioplasty    . Video bronchoscopy with endobronchial navigation N/A 10/04/2013    Procedure: VIDEO BRONCHOSCOPY WITH ENDOBRONCHIAL NAVIGATION;  Surgeon: Collene Gobble, MD;  Location: [facility];  Service: Thoracic;  Laterality: N/A;  . Back surgery      lower  . Anterior fusion cervical spine      cervical fusion  7 yrs ago ([facility])  . Colon surgery      '11 "Diverticulitis"     Current Outpatient Prescriptions  Medication Sig Dispense Refill  . albuterol (PROVENTIL) (2.5 MG/3ML) 0.083% nebulizer solution INHALE 1 VIAL IN NEBULIZER EVERY 4 HOURS AS NEEDED FOR WHEEZING OR SHORTNESS OF BREATH 300 mL 11  . ALPRAZolam (XANAX) 0.25 MG tablet Take 0.25 mg by mouth daily as needed for anxiety.     . ARIPiprazole (ABILIFY) 5 MG tablet Take 1 tablet (5 mg total) by mouth at bedtime.    . Armodafinil (NUVIGIL) 150 MG tablet Take 150 mg by mouth every morning.     [name] aspirin EC 81 MG tablet Take 1 tablet (81 mg total) by mouth daily. 30 tablet 0  . atorvastatin (LIPITOR) 20 MG tablet TAKE 1 TABLET (20 MG TOTAL) BY MOUTH DAILY. (Patient taking differently: TAKE 1 TABLET (20 MG TOTAL) BY MOUTH NIGHTLY.) 90 tablet 3  . BD PEN NEEDLE NANO U/F 32G X 4 MM MISC 1 EACH BY DOES NOT APPLY ROUTE DAILY. 100 each 3  . clonazePAM (KLONOPIN) 1 MG tablet Take 2 mg by mouth at bedtime as needed for anxiety.     [name] escitalopram (LEXAPRO) 20 MG tablet Take 1 tablet (20 mg total) by mouth daily. 90 tablet 0  . esomeprazole (NEXIUM) 40 MG capsule Take 40 mg by mouth every morning.     [name] FLUoxetine (PROZAC) 40  MG capsule Take 40 mg by mouth every morning.    . fluticasone (FLONASE) 50 MCG/ACT nasal spray Place 2 sprays into both nostrils daily. (Patient taking differently: Place 2 sprays into both nostrils daily as needed for allergies or rhinitis. ) 16 g 3  . Fluticasone-Salmeterol (ADVAIR DISKUS) 500-50 MCG/DOSE AEPB 1 puff then rinse mouth, twice daily maintenance 60 each 11  . Insulin Glargine (LANTUS SOLOSTAR) 100  UNIT/ML Solostar Pen 5 units at bedtime. 5 pen PRN  . isosorbide mononitrate (IMDUR) 60 MG 24 hr tablet Take 1 tablet (60 mg total) by mouth daily. 90 tablet 3  . levofloxacin (LEVAQUIN) 750 MG tablet Take 1 tablet (750 mg total) by mouth daily. 7 tablet 0  . metFORMIN (GLUCOPHAGE-XR) 500 MG 24 hr tablet Take 2 tablets (1,000 mg total) by mouth daily with breakfast. 180 tablet 1  . methocarbamol (ROBAXIN) 500 MG tablet Take 500 mg by mouth 2 (two) times daily as needed for muscle spasms.     . metoprolol succinate (TOPROL-XL) 25 MG 24 hr tablet TAKE 1/2 TABLET BY MOUTH DAILY 30 tablet 5  . mirtazapine (REMERON) 45 MG tablet Take 1 tablet (45 mg total) by mouth at bedtime.    [name] morphine (MS CONTIN) 30 MG 12 hr tablet Take 1 tablet (30 mg total) by mouth every 12 (twelve) hours.    [name] morphine (MSIR) 15 MG tablet Take 1 tablet (15 mg total) by mouth every 4 (four) hours as needed for severe pain. 30 tablet 0  . nitroGLYCERIN (NITROSTAT) 0.4 MG SL tablet Place 1 tablet (0.4 mg total) under the tongue every 5 (five) minutes as needed for chest pain. 25 tablet 3  . nystatin (MYCOSTATIN) 100000 UNIT/ML suspension Take 5 mLs (500,000 Units total) by mouth 4 (four) times daily. 60 mL 1  . polyethylene glycol powder (GLYCOLAX/MIRALAX) powder Take as directed for colonoscopy. 255 g 0  . potassium chloride SA (K-DUR,KLOR-CON) 20 MEQ tablet Take 20 mEq by mouth every morning.     . predniSONE (DELTASONE) 10 MG tablet Take 4 tablets twice daily for 3 days, then take 4 tablets once daily for 4 days, then  take 2 tablets once daily for 4 days, then take 1 tablets once daily as before. (Patient taking differently: Take 10 mg by mouth every morning. ) 60 tablet 0  . prochlorperazine (COMPAZINE) 10 MG tablet TAKE 1 TABLET BY MOUTH EVERY 6 HOURS AS NEEDED FOR NAUSEA AND VOMITING 60 tablet 3  . theophylline (THEO-24) 400 MG 24 hr capsule Take 1 capsule (400 mg total) by mouth daily. 30 capsule 5  . Tiotropium Bromide-Olodaterol (STIOLTO RESPIMAT) 2.5-2.5 MCG/ACT AERS Inhale 2 puffs into the lungs daily. 1 Inhaler 5  . VENTOLIN HFA 108 (90 BASE) MCG/ACT inhaler INHALE 2 PUFFS INTO THE LUNGS 4 TIMES DAILY AS NEEDED FOR WHEEZING 18 Inhaler 11  . VOLTAREN 1 % GEL Apply 2 g topically daily as needed (for pain).      No current facility-administered medications for this visit.    Allergies:   Carboplatin    Social History:  The patient  reports that he quit smoking about 19 months ago. His smoking use included Cigarettes. He has a 21.5 pack-year smoking history. He has never used smokeless tobacco. He reports that he does not drink alcohol or use illicit drugs.   Family History:  The patient's family history includes Cancer in his sister; Emphysema in his sister; Heart attack in his mother and sister; Hypertension in his brother; Lung cancer in his sister. There is no history of Stroke.    ROS:   Please see the history of present illness.   Review of Systems  Constitution: Positive for decreased appetite, diaphoresis, malaise/fatigue and weight gain.  HENT: Positive for headaches.   Cardiovascular: Positive for chest pain and dyspnea on exertion.  Respiratory: Positive for cough, snoring and wheezing.   Hematologic/Lymphatic: Bruises/bleeds easily.  Musculoskeletal: Positive  for back pain, joint pain and myalgias.  Genitourinary: Positive for nocturia.  Neurological: Positive for dizziness and loss of balance.  Psychiatric/Behavioral: Positive for depression. The patient is nervous/anxious.   All  other systems reviewed and are negative.     PHYSICAL EXAM: VS:  BP 120/60 mmHg  Pulse 87  Ht '5\' 6"'$  (1.676 m)  Wt 207 lb (93.895 kg)  BMI 33.43 kg/m2    Wt Readings from Last 3 Encounters:  05/17/15 207 lb (93.895 kg)  05/08/15 207 lb (93.895 kg)  04/19/15 207 lb (93.895 kg)     GEN: Well nourished, well developed, in no acute distress HEENT: normal Neck: I cannot assess JVD,  no masses Cardiac:  Normal S1/S2, RRR; no murmur ,  no rubs or gallops, no edema   Respiratory:  Decreased breath sounds bilaterally, diffuse ins/exp rhonchi, no rales. GI: soft, nontender, nondistended  MS: no deformity or atrophy Skin: warm and dry  Neuro:  CNs II-XII intact, Strength and sensation are intact Psych: Normal affect   EKG:  EKG is ordered today.  It demonstrates:   NSR, HR 87, rightward axis, NSSTTW changes, no change from prior tracing.    Recent Labs: 01/26/2015: ALT 28 04/11/2015: B Natriuretic Peptide 305.2*; Magnesium 1.8 04/15/2015: BUN 16; Creatinine, Ser 0.85; Hemoglobin 14.0; Platelets 88*; Potassium 3.9; Sodium 138    Lipid Panel    Component Value Date/Time   CHOL 134 07/18/2014 1558   TRIG 417* 04/11/2015 0244   HDL 35.30* 07/18/2014 1558   CHOLHDL 4 07/18/2014 1558   VLDL 37.0 07/18/2014 1558   LDLCALC 62 07/18/2014 1558   LDLDIRECT 71.4 09/08/2012 0959      ASSESSMENT AND PLAN:  Other chest pain:  Chest pain is somewhat atypical but does have typical features. ECG is unremarkable. He did have a recent admission to the hospital with ventilator dependent respiratory failure. He had elevated troponins. There was no clear trend. His echocardiogram demonstrated no wall motion abnormalities with normal LV function. Elevated troponins at that time were likely related to demand ischemia. He does have a history of nonobstructive CAD. He is a diabetic. He has continued to smoke (although he has now quit). I suspect his chest discomfort is related to his neck issues or  possibly bronchospasm from his COPD. However, ischemia needs to be ruled out. His troponin should be normal by now as his hospitalization was last month. I will obtain a stat troponin today. If this is normal, proceed with Lexiscan Myoview. If troponin elevated, send to the hospital for probable cardiac catheterization.  Elevated troponin:  As noted, elevated troponin last month was likely related to demand ischemia in the setting of acute illness. Proceed with testing as outlined above.  Coronary artery disease involving native coronary artery of native heart without angina pectoris:  Proceed with Myoview as noted. Continue aspirin, statin, beta blocker, nitrates.  Essential hypertension: Controlled.  Pure hypercholesterolemia: Continue statin.  Squamous cell lung cancer, unspecified laterality: Follow-up with pulmonology and oncology as planned.  TOBACCO USER:  He has quit smoking.  Obstructive sleep apnea:  He tells me that he is adherent to CPAP.  Chronic respiratory failure with hypoxia:  Continue follow-up with pulmonology.  PAD (peripheral artery disease): Continue aspirin, statin.  Diabetes:  Ok to hold Lantus night before Myoview.    Medication Changes: Current medicines are reviewed at length with the patient today.  Concerns regarding medicines are as outlined above.  The following changes have been made:  Discontinued Medications   No medications on file   Modified Medications   No medications on file   New Prescriptions   No medications on file    Labs/ tests ordered today include:   Orders Placed This Encounter  Procedures  . Troponin I  . Myocardial Perfusion Imaging  . EKG 12-Lead     Disposition:   FU with Dr. Minus Breeding 1 month .   Signed, Versie Starks, MHS 05/17/2015 1:17 PM    [facility] [place], [place], [place]  [address] Phone: [phone]; Fax: [phone]

## 2015-05-17 ENCOUNTER — Encounter: Payer: Self-pay | Admitting: Physician Assistant

## 2015-05-17 ENCOUNTER — Ambulatory Visit (INDEPENDENT_AMBULATORY_CARE_PROVIDER_SITE_OTHER): Payer: Commercial Managed Care - HMO | Admitting: Physician Assistant

## 2015-05-17 ENCOUNTER — Telehealth: Payer: Self-pay

## 2015-05-17 VITALS — BP 120/60 | HR 87 | Ht 66.0 in | Wt 207.0 lb

## 2015-05-17 DIAGNOSIS — I10 Essential (primary) hypertension: Secondary | ICD-10-CM

## 2015-05-17 DIAGNOSIS — R778 Other specified abnormalities of plasma proteins: Secondary | ICD-10-CM

## 2015-05-17 DIAGNOSIS — Z720 Tobacco use: Secondary | ICD-10-CM

## 2015-05-17 DIAGNOSIS — R0789 Other chest pain: Secondary | ICD-10-CM | POA: Diagnosis not present

## 2015-05-17 DIAGNOSIS — I739 Peripheral vascular disease, unspecified: Secondary | ICD-10-CM

## 2015-05-17 DIAGNOSIS — J9611 Chronic respiratory failure with hypoxia: Secondary | ICD-10-CM

## 2015-05-17 DIAGNOSIS — I2510 Atherosclerotic heart disease of native coronary artery without angina pectoris: Secondary | ICD-10-CM | POA: Insufficient documentation

## 2015-05-17 DIAGNOSIS — C3490 Malignant neoplasm of unspecified part of unspecified bronchus or lung: Secondary | ICD-10-CM

## 2015-05-17 DIAGNOSIS — E7800 Pure hypercholesterolemia, unspecified: Secondary | ICD-10-CM

## 2015-05-17 DIAGNOSIS — F17200 Nicotine dependence, unspecified, uncomplicated: Secondary | ICD-10-CM

## 2015-05-17 DIAGNOSIS — G4733 Obstructive sleep apnea (adult) (pediatric): Secondary | ICD-10-CM

## 2015-05-17 DIAGNOSIS — R799 Abnormal finding of blood chemistry, unspecified: Secondary | ICD-10-CM | POA: Diagnosis not present

## 2015-05-17 DIAGNOSIS — R7989 Other specified abnormal findings of blood chemistry: Secondary | ICD-10-CM

## 2015-05-17 LAB — TROPONIN I: Troponin I: <0.01 ng/mL (ref <0.06)

## 2015-05-17 NOTE — Patient Instructions (Signed)
Medication Instructions:  Your physician recommends that you continue on your current medications as directed. Please refer to the Current Medication list given to you today.   Labwork: TODAY STAT TROPONIN I;   Testing/Procedures: Your physician has requested that you have a lexiscan myoview. For further information please visit [email or web address]. Please follow instruction sheet, as given.   Follow-Up: DR. Percival Spanish IN 1 MONTH OR PA/NP AT [facility]  Any Other Special Instructions Will Be Listed Below (If Applicable).

## 2015-05-17 NOTE — Telephone Encounter (Signed)
Troponin results to Richardson Dopp PA

## 2015-05-18 ENCOUNTER — Other Ambulatory Visit: Payer: Self-pay | Admitting: Internal Medicine

## 2015-05-19 ENCOUNTER — Encounter (HOSPITAL_COMMUNITY): Payer: Self-pay

## 2015-05-19 DIAGNOSIS — J449 Chronic obstructive pulmonary disease, unspecified: Secondary | ICD-10-CM | POA: Diagnosis not present

## 2015-05-19 NOTE — Progress Notes (Signed)
05-19-2015 Spoke with Dr. Lauree Chandler about Ernest [name] (DOB: [birth year]) who is scheduled for a Lexiscan Cardiolite. The patient is on Theopylline, Nebulizer treatments, and prednisone. The patient was hospitalized in May with Ventilator dependent respiratory failure. Verbal Order by Dr. Lauree Chandler to change to Dobutamine cardiolite. Irven Baltimore, RN

## 2015-05-21 ENCOUNTER — Telehealth: Payer: Self-pay | Admitting: Internal Medicine

## 2015-05-21 NOTE — Telephone Encounter (Signed)
Lost prep instructions Sent by My Chart

## 2015-05-22 ENCOUNTER — Encounter (HOSPITAL_COMMUNITY): Payer: Self-pay | Admitting: Anesthesiology

## 2015-05-22 ENCOUNTER — Ambulatory Visit (HOSPITAL_COMMUNITY): Payer: Commercial Managed Care - HMO | Admitting: Anesthesiology

## 2015-05-22 ENCOUNTER — Encounter (HOSPITAL_COMMUNITY)
Admission: RE | Disposition: A | Discharge status: Home / Self Care | Payer: Self-pay | Source: Ambulatory Visit | Attending: Gastroenterology

## 2015-05-22 ENCOUNTER — Ambulatory Visit (HOSPITAL_COMMUNITY)
Admission: RE | Admit: 2015-05-22 | Discharge: 2015-05-22 | Disposition: A | Discharge status: Home / Self Care | Payer: Commercial Managed Care - HMO | Source: Ambulatory Visit | Attending: Gastroenterology | Admitting: Gastroenterology

## 2015-05-22 DIAGNOSIS — Z79899 Other long term (current) drug therapy: Secondary | ICD-10-CM | POA: Diagnosis not present

## 2015-05-22 DIAGNOSIS — Z923 Personal history of irradiation: Secondary | ICD-10-CM | POA: Insufficient documentation

## 2015-05-22 DIAGNOSIS — Z7952 Long term (current) use of systemic steroids: Secondary | ICD-10-CM | POA: Diagnosis not present

## 2015-05-22 DIAGNOSIS — D125 Benign neoplasm of sigmoid colon: Secondary | ICD-10-CM

## 2015-05-22 DIAGNOSIS — Z794 Long term (current) use of insulin: Secondary | ICD-10-CM | POA: Insufficient documentation

## 2015-05-22 DIAGNOSIS — J45909 Unspecified asthma, uncomplicated: Secondary | ICD-10-CM | POA: Diagnosis not present

## 2015-05-22 DIAGNOSIS — Z87891 Personal history of nicotine dependence: Secondary | ICD-10-CM | POA: Insufficient documentation

## 2015-05-22 DIAGNOSIS — G4733 Obstructive sleep apnea (adult) (pediatric): Secondary | ICD-10-CM | POA: Insufficient documentation

## 2015-05-22 DIAGNOSIS — D175 Benign lipomatous neoplasm of intra-abdominal organs: Secondary | ICD-10-CM | POA: Insufficient documentation

## 2015-05-22 DIAGNOSIS — Z9221 Personal history of antineoplastic chemotherapy: Secondary | ICD-10-CM | POA: Insufficient documentation

## 2015-05-22 DIAGNOSIS — Z1211 Encounter for screening for malignant neoplasm of colon: Secondary | ICD-10-CM | POA: Insufficient documentation

## 2015-05-22 DIAGNOSIS — Z7951 Long term (current) use of inhaled steroids: Secondary | ICD-10-CM | POA: Insufficient documentation

## 2015-05-22 DIAGNOSIS — E119 Type 2 diabetes mellitus without complications: Secondary | ICD-10-CM | POA: Insufficient documentation

## 2015-05-22 DIAGNOSIS — Z791 Long term (current) use of non-steroidal anti-inflammatories (NSAID): Secondary | ICD-10-CM | POA: Diagnosis not present

## 2015-05-22 DIAGNOSIS — M109 Gout, unspecified: Secondary | ICD-10-CM | POA: Insufficient documentation

## 2015-05-22 DIAGNOSIS — I10 Essential (primary) hypertension: Secondary | ICD-10-CM | POA: Insufficient documentation

## 2015-05-22 DIAGNOSIS — F418 Other specified anxiety disorders: Secondary | ICD-10-CM | POA: Insufficient documentation

## 2015-05-22 DIAGNOSIS — K219 Gastro-esophageal reflux disease without esophagitis: Secondary | ICD-10-CM | POA: Insufficient documentation

## 2015-05-22 DIAGNOSIS — Z85118 Personal history of other malignant neoplasm of bronchus and lung: Secondary | ICD-10-CM | POA: Diagnosis not present

## 2015-05-22 DIAGNOSIS — Z955 Presence of coronary angioplasty implant and graft: Secondary | ICD-10-CM | POA: Diagnosis not present

## 2015-05-22 DIAGNOSIS — K5730 Diverticulosis of large intestine without perforation or abscess without bleeding: Secondary | ICD-10-CM | POA: Diagnosis not present

## 2015-05-22 DIAGNOSIS — G4700 Insomnia, unspecified: Secondary | ICD-10-CM | POA: Diagnosis not present

## 2015-05-22 DIAGNOSIS — J9611 Chronic respiratory failure with hypoxia: Secondary | ICD-10-CM | POA: Diagnosis not present

## 2015-05-22 DIAGNOSIS — I2510 Atherosclerotic heart disease of native coronary artery without angina pectoris: Secondary | ICD-10-CM | POA: Diagnosis not present

## 2015-05-22 DIAGNOSIS — E780 Pure hypercholesterolemia: Secondary | ICD-10-CM | POA: Diagnosis not present

## 2015-05-22 DIAGNOSIS — I739 Peripheral vascular disease, unspecified: Secondary | ICD-10-CM | POA: Insufficient documentation

## 2015-05-22 DIAGNOSIS — Z9981 Dependence on supplemental oxygen: Secondary | ICD-10-CM | POA: Insufficient documentation

## 2015-05-22 DIAGNOSIS — J449 Chronic obstructive pulmonary disease, unspecified: Secondary | ICD-10-CM | POA: Insufficient documentation

## 2015-05-22 DIAGNOSIS — Z800 Family history of malignant neoplasm of digestive organs: Secondary | ICD-10-CM

## 2015-05-22 DIAGNOSIS — Z79891 Long term (current) use of opiate analgesic: Secondary | ICD-10-CM | POA: Insufficient documentation

## 2015-05-22 DIAGNOSIS — K635 Polyp of colon: Secondary | ICD-10-CM | POA: Diagnosis not present

## 2015-05-22 HISTORY — PX: COLONOSCOPY WITH PROPOFOL: SHX5780

## 2015-05-22 LAB — GLUCOSE, CAPILLARY: Glucose-Capillary: 102 mg/dL — ABNORMAL HIGH (ref 65–99)

## 2015-05-22 SURGERY — COLONOSCOPY WITH PROPOFOL
Anesthesia: Monitor Anesthesia Care

## 2015-05-22 MED ORDER — PROPOFOL 10 MG/ML IV BOLUS
INTRAVENOUS | Status: AC
  Filled 2015-05-22: qty 20

## 2015-05-22 MED ORDER — FENTANYL CITRATE (PF) 100 MCG/2ML IJ SOLN
INTRAMUSCULAR | Status: DC | PRN
  Administered 2015-05-22 (×2): 25 ug via INTRAVENOUS

## 2015-05-22 MED ORDER — PROPOFOL INFUSION 10 MG/ML OPTIME
INTRAVENOUS | Status: DC | PRN
  Administered 2015-05-22: 140 ug/kg/min via INTRAVENOUS

## 2015-05-22 MED ORDER — LIDOCAINE HCL (CARDIAC) 20 MG/ML IV SOLN
INTRAVENOUS | Status: AC
  Filled 2015-05-22: qty 5

## 2015-05-22 MED ORDER — SODIUM CHLORIDE 0.9 % IV SOLN: INTRAVENOUS | Status: DC

## 2015-05-22 MED ORDER — LACTATED RINGERS IV SOLN
INTRAVENOUS | Status: DC
  Administered 2015-05-22: 125 mL/h via INTRAVENOUS

## 2015-05-22 MED ORDER — FENTANYL CITRATE (PF) 100 MCG/2ML IJ SOLN
INTRAMUSCULAR | Status: AC
  Filled 2015-05-22: qty 2

## 2015-05-22 SURGICAL SUPPLY — 21 items

## 2015-05-22 NOTE — Transfer of Care (Signed)
Immediate Anesthesia Transfer of Care Note  Patient: Ernest [name]  Procedure(s) Performed: Procedure(s): COLONOSCOPY WITH PROPOFOL (N/A)  Patient Location: Endoscopy Unit  Anesthesia Type:MAC  Level of Consciousness: awake, alert  and oriented  Airway & Oxygen Therapy: Patient Spontanous Breathing and Patient connected to nasal cannula oxygen  Post-op Assessment: Report given to RN and Post -op Vital signs reviewed and stable  Post vital signs: Reviewed and stable  Last Vitals:  Filed Vitals:   05/22/15 0919  BP: 143/43  Pulse: 84  Temp: 36.8 C  Resp: 24    Complications: No apparent anesthesia complications

## 2015-05-22 NOTE — Anesthesia Preprocedure Evaluation (Signed)
Anesthesia Evaluation  Patient identified by MRN, date of birth, ID band Patient awake    Reviewed: Allergy & Precautions, H&P , NPO status , Patient's Chart, lab work & pertinent test results, reviewed documented beta blocker date and time   Airway Mallampati: II  TM Distance: >3 FB Neck ROM: full    Dental  (+) Edentulous Upper, Edentulous Lower, Dental Advisory Given   Pulmonary asthma , sleep apnea , COPDformer smoker,  Lung cancer breath sounds clear to auscultation  Pulmonary exam normal       Cardiovascular Exercise Tolerance: Good hypertension, Pt. on home beta blockers + CAD Normal cardiovascular examRhythm:regular Rate:Normal     Neuro/Psych negative neurological ROS  negative psych ROS   GI/Hepatic negative GI ROS, Neg liver ROS,   Endo/Other  diabetes, Well Controlled, Type 2, Insulin Dependent, Oral Hypoglycemic Agents  Renal/GU negative Renal ROS  negative genitourinary   Musculoskeletal   Abdominal   Peds  Hematology negative hematology ROS (+)   Anesthesia Other Findings   Reproductive/Obstetrics negative OB ROS                             Anesthesia Physical Anesthesia Plan  ASA: IV  Anesthesia Plan: MAC   Post-op Pain Management:    Induction:   Airway Management Planned:   Additional Equipment:   Intra-op Plan:   Post-operative Plan:   Informed Consent: I have reviewed the patients History and Physical, chart, labs and discussed the procedure including the risks, benefits and alternatives for the proposed anesthesia with the patient or authorized representative who has indicated his/her understanding and acceptance.   Dental Advisory Given  Plan Discussed with: CRNA and Surgeon  Anesthesia Plan Comments:         Anesthesia Quick Evaluation

## 2015-05-22 NOTE — Discharge Instructions (Signed)
Colonoscopy, Care After Refer to this sheet in the next few weeks. These instructions provide you with information on caring for yourself after your procedure. Your health care provider may also give you more specific instructions. Your treatment has been planned according to current medical practices, but problems sometimes occur. Call your health care provider if you have any problems or questions after your procedure. WHAT TO EXPECT AFTER THE PROCEDURE  After your procedure, it is typical to have the following:  A small amount of blood in your stool.  Moderate amounts of gas and mild abdominal cramping or bloating. HOME CARE INSTRUCTIONS  Do not drive, operate machinery, or sign important documents for 24 hours.  You may shower and resume your regular physical activities, but move at a slower pace for the first 24 hours.  Take frequent rest periods for the first 24 hours.  Walk around or put a warm pack on your abdomen to help reduce abdominal cramping and bloating.  Drink enough fluids to keep your urine clear or pale yellow.  You may resume your normal diet as instructed by your health care provider. Avoid heavy or fried foods that are hard to digest.  Avoid drinking alcohol for 24 hours or as instructed by your health care provider.  Only take over-the-counter or prescription medicines as directed by your health care provider.  If a tissue sample (biopsy) was taken during your procedure:  Do not take aspirin or blood thinners for 7 days, or as instructed by your health care provider.  Do not drink alcohol for 7 days, or as instructed by your health care provider.  Eat soft foods for the first 24 hours. SEEK MEDICAL CARE IF: You have persistent spotting of blood in your stool 2-3 days after the procedure. SEEK IMMEDIATE MEDICAL CARE IF:  You have more than a small spotting of blood in your stool.  You pass large blood clots in your stool.  Your abdomen is swollen  (distended).  You have nausea or vomiting.  You have a fever.  You have increasing abdominal pain that is not relieved with medicine. Document Released: 06/25/2004 Document Revised: 09/01/2013 Document Reviewed: 07/19/2013 [facility] Patient Information 2015 [place], [place]. This information is not intended to replace advice given to you by your health care provider. Make sure you discuss any questions you have with your health care provider.  Conscious Sedation, Adult, Care After Refer to this sheet in the next few weeks. These instructions provide you with information on caring for yourself after your procedure. Your health care provider may also give you more specific instructions. Your treatment has been planned according to current medical practices, but problems sometimes occur. Call your health care provider if you have any problems or questions after your procedure. WHAT TO EXPECT AFTER THE PROCEDURE  After your procedure:  You may feel sleepy, clumsy, and have poor balance for several hours.  Vomiting may occur if you eat too soon after the procedure. HOME CARE INSTRUCTIONS  Do not participate in any activities where you could become injured for at least 24 hours. Do not:  Drive.  Swim.  Ride a bicycle.  Operate heavy machinery.  Cook.  Use power tools.  Climb ladders.  Work from a high place.  Do not make important decisions or sign legal documents until you are improved.  If you vomit, drink water, juice, or soup when you can drink without vomiting. Make sure you have little or no nausea before eating solid foods.  Only take over-the-counter or prescription medicines for pain, discomfort, or fever as directed by your health care provider.  Make sure you and your family fully understand everything about the medicines given to you, including what side effects may occur.  You should not drink alcohol, take sleeping pills, or take medicines that cause drowsiness for  at least 24 hours.  If you smoke, do not smoke without supervision.  If you are feeling better, you may resume normal activities 24 hours after you were sedated.  Keep all appointments with your health care provider. SEEK MEDICAL CARE IF:  Your skin is pale or bluish in color.  You continue to feel nauseous or vomit.  Your pain is getting worse and is not helped by medicine.  You have bleeding or swelling.  You are still sleepy or feeling clumsy after 24 hours. SEEK IMMEDIATE MEDICAL CARE IF:  You develop a rash.  You have difficulty breathing.  You develop any type of allergic problem.  You have a fever. MAKE SURE YOU:  Understand these instructions.  Will watch your condition.  Will get help right away if you are not doing well or get worse. Document Released: 09/01/2013 Document Reviewed: 09/01/2013 [facility] Patient Information 2015 [place], [place]. This information is not intended to replace advice given to you by your health care provider. Make sure you discuss any questions you have with your health care provider.

## 2015-05-22 NOTE — H&P (Signed)
Patient ID: Ernest [name], male DOB: 1955/11/27, 59 y.o. MRN: [id number]  HPI Ernest [name] is a pleasant 59 year old white male known remotely to Dr. Deatra Ina from prior colonoscopy. He had colonoscopy done in 2005 with finding of a 40 mm lesion at the hepatic flexure consistent with a lipoma this was not removed. He also had left colon diverticulosis no polyps. Patient comes in today after receiving a letter for recall colonoscopy. Patient relates that he does have a sister who had colon cancer.  He has no current GI complaints specifically no problems with abdominal pain and changes in bowel habits melena or hematochezia. He is oxygen dependent with history of COPD, sleep apnea, and squamous cell lung cancer diagnosed in November 2014. He has a stage III a disease and has undergone chemotherapy and radiation. He is being followed by Dr. Earlie Server and currently is on observation ,with no evidence of progression. Other medical problems include coronary artery disease ,status post remote stent, peripheral vascular disease, hyperlipidemia, gout and depression. His pulmonologist is Dr. Annamaria Boots.  Review of Systems Pertinent positive and negative review of systems were noted in the above HPI section. All other review of systems was otherwise negative.  Outpatient Encounter Prescriptions as of 03/21/2015  Medication Sig  . albuterol (PROVENTIL) (2.5 MG/3ML) 0.083% nebulizer solution INHALE 1 VIAL IN NEBULIZER EVERY 4 HOURS AS NEEDED FOR WHEEZING OR SHORTNESS OF BREATH  . ALPRAZolam (XANAX) 0.25 MG tablet Take 0.25 mg by mouth daily as needed.  . ARIPiprazole (ABILIFY) 5 MG tablet Take 1 tablet (5 mg total) by mouth at bedtime.  . Armodafinil (NUVIGIL) 150 MG tablet Take 150 mg by mouth daily.  [name] atorvastatin (LIPITOR) 20 MG tablet TAKE 1 TABLET (20 MG TOTAL) BY MOUTH DAILY.  . clonazePAM (KLONOPIN) 1 MG tablet Take 2 mg by mouth at bedtime as needed. For anxiety/insomnia  .  escitalopram (LEXAPRO) 20 MG tablet Take 1 tablet (20 mg total) by mouth daily.  [name] esomeprazole (NEXIUM) 40 MG capsule Take 40 mg by mouth daily.  . fluticasone (FLONASE) 50 MCG/ACT nasal spray Place 2 sprays into both nostrils daily.  . Fluticasone-Salmeterol (ADVAIR DISKUS) 500-50 MCG/DOSE AEPB 1 puff then rinse mouth, twice daily maintenance  . gabapentin (NEURONTIN) 100 MG capsule Take 100 mg by mouth daily.   . Insulin Glargine (LANTUS SOLOSTAR) 100 UNIT/ML Solostar Pen 5 units at bedtime.  . Insulin Pen Needle (BD PEN NEEDLE NANO U/F) 32G X 4 MM MISC 1 each by Does not apply route daily.  . isosorbide mononitrate (IMDUR) 60 MG 24 hr tablet Take 1 tablet (60 mg total) by mouth daily.  . methocarbamol (ROBAXIN) 500 MG tablet Take 500 mg by mouth 2 (two) times daily as needed for muscle spasms.   . metoprolol succinate (TOPROL-XL) 25 MG 24 hr tablet TAKE 1/2 TABLET BY MOUTH DAILY  . mirtazapine (REMERON) 45 MG tablet Take 1 tablet (45 mg total) by mouth at bedtime.  [name] morphine (MS CONTIN) 30 MG 12 hr tablet Take 30 mg by mouth 3 (three) times daily.  . nitroGLYCERIN (NITROSTAT) 0.4 MG SL tablet Place 1 tablet (0.4 mg total) under the tongue every 5 (five) minutes as needed for chest pain.  [name] nystatin (MYCOSTATIN) 100000 UNIT/ML suspension Take 5 mLs (500,000 Units total) by mouth 4 (four) times daily.  . potassium chloride SA (K-DUR,KLOR-CON) 20 MEQ tablet Take 20 mEq by mouth daily.  . predniSONE (DELTASONE) 10 MG tablet Take 1 tablet (10 mg total) by mouth  daily with breakfast.  . prochlorperazine (COMPAZINE) 10 MG tablet TAKE 1 TABLET BY MOUTH EVERY 6 HOURS AS NEEDED FOR NAUSEA AND VOMITING  . theophylline (THEO-24) 200 MG 24 hr capsule Take 200 mg by mouth 2 (two) times daily.  . theophylline (THEO-24) 400 MG 24 hr capsule Take 1 capsule (400 mg total) by mouth daily.  . Tiotropium Bromide-Olodaterol (STIOLTO RESPIMAT) 2.5-2.5 MCG/ACT  AERS Inhale 2 puffs into the lungs daily.  . VOLTAREN 1 % GEL Apply 2 g topically daily as needed (for pain).   . polyethylene glycol powder (GLYCOLAX/MIRALAX) powder Take as directed for colonoscopy.   Allergies  Allergen Reactions  . Carboplatin Shortness Of Breath, Swelling and Rash    Swelling of lips, rash on face,eyes and head   Patient Active Problem List   Diagnosis Date Noted  . Chronic respiratory failure with hypoxia 12/15/2014  . History of colonic polyps 10/26/2014  . Dysfunction of right Eustachian tube 06/20/2014  . Chronic hoarseness 06/20/2014  . COPD with exacerbation 02/15/2014  . Thrush, oral 01/30/2014  . Squamous cell lung cancer 10/07/2013  . Dental caries 04/29/2013  . Abdominal muscle strain 06/04/2012  . Hot flashes 08/14/2011  . Prostatitis 08/01/2011  . Hyperglycemia 08/01/2011  . Preventative health care 08/01/2011  . Obstructive sleep apnea 09/24/2010  . CORONARY ATHEROSCLEROSIS NATIVE CORONARY ARTERY 07/16/2010  . CHEST PAIN 06/12/2010  . Peripheral vascular disease 05/15/2010  . PURE HYPERCHOLESTEROLEMIA 05/16/2009  . TOBACCO USER 11/16/2007  . GOUT 09/08/2007  . Depression with anxiety 09/08/2007  . Essential hypertension 09/08/2007  . 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 Lung nodule, hx of 09/08/2007  . GERD 09/08/2007  . COUGH SYNCOPE 09/08/2007  . INSOMNIA, HX OF 09/08/2007   History   Social History  . Marital Status: Married    Spouse Name: N/A  . Number of Children: N/A  . Years of Education: N/A   Occupational History  . Disabled welder    Social History Main Topics  . Smoking status: Former Smoker -- 0.50 packs/day for 43 years    Types: Cigarettes    Quit date: 09/23/2013  . Smokeless tobacco: Never Used     Comment: history of 3 PPD, 11/02/13   . Alcohol Use: No  . Drug Use:  No  . Sexual Activity: No   Other Topics Concern  . Not on file   Social History Narrative    Ernest [name]'s family history includes Cancer in his sister; Emphysema in his sister; Heart attack in his mother and sister; Hypertension in his brother; Lung cancer in his sister.      Objective:    Filed Vitals:   03/21/15 1046  BP: 130/60  Pulse: 72    Physical Exam well-developed older white male in no acute distress, pleasant blood pressure 130/60 pulse 72 height 5 foot 6 weight 208. HEENT ;nontraumatic normocephalic EOMI PERRLA sclera anicteric, Supple; no JVD, Cardiovascular ;regular rate and rhythm with S1-S2, Pulmonary; decreased breath sounds bilaterally and scattered rhonchi., Abdomen ;soft, nontender, nondistended, bowel sounds are active there is no palpable mass or hepatosplenomegaly, Rectal ;exam not done, Ext; no clubbing cyanosis or edema skin warm and dry, Psych; mood and affect appropriate       Assessment & Plan:   #1 59 yo male seen for colon screening/recall colon - last colonoscopy 2005- no polyps #2 hepatic flexure lipoma #3 family hx of colon cancer -sister #4 O2 dependent COPD #5 OSA #6 Hx of squamous cell lung Ca -s/p  chemo/radiation- stage 3 A (2014) #7 CAD #8 PVD #9 Diverticulosis #10 IDDM  Plan;  Colonoscopy

## 2015-05-22 NOTE — Op Note (Signed)
[facility] [place], [address]   COLONOSCOPY PROCEDURE REPORT     EXAM DATE: 06/16/2015  PATIENT NAME:      Ernest [name], Ernest [name]           MR #: [id number] BIRTHDATE:       [birth year]      VISIT #:     [phone]  ATTENDING:     Inda Castle, MD     STATUS:     outpatient ASSISTANT:      Jeb Levering and Cristopher Estimable  INDICATIONS:  The patient is a 59 yr old male here for a colonoscopy due to PROCEDURE PERFORMED:     Colonoscopy with snare polypectomy MEDICATIONS:     Monitored anesthesia care ESTIMATED BLOOD LOSS:     None  CONSENT: The patient understands the risks and benefits of the procedure and understands that these risks include, but are not limited to: sedation, allergic reaction, infection, perforation and/or bleeding. Alternative means of evaluation and treatment include, among others: physical exam, x-rays, and/or surgical intervention. The patient elects to proceed with this endoscopic procedure.  DESCRIPTION OF PROCEDURE: During intra-op preparation period all mechanical & medical equipment was checked for proper function. Hand hygiene and appropriate measures for infection prevention was taken. After the risks, benefits and alternatives of the procedure were thoroughly explained, Informed consent was verified, confirmed and timeout was successfully executed by the treatment team. A digital exam revealed no abnormalities of the rectum. The EC-[id number] ([id number]) endoscope was introduced through the anus and advanced to the cecum, which was identified by both the appendix and ileocecal valve. (Suprep was used) good. The instrument was then slowly withdrawn as the colon was fully examined.Estimated blood loss is zero unless otherwise noted in this procedure report.   COLON FINDINGS: 2 Medium sized lipomawere found in the transverse colon and at the hepatic flexure.   There was mild diverticulosis noted in the  ascending colon and sigmoid colon.   A sessile polyp measuring 5 mm in size was found in the sigmoid colon.  A polypectomy was performed with a cold snare.  The resection was complete, the polyp tissue was completely retrieved and sent to histology. Retroflexed views revealed no abnormalities. The scope was then completely withdrawn from the patient and the procedure terminated. SCOPE WITHDRAWAL TIME:    ADVERSE EVENTS:      There were no immediate complications.  IMPRESSIONS:     1.  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  Mild diverticulosis was noted in the ascending colon and sigmoid colon 3.  Sessile polyp was found in the sigmoid colon; polypectomy was performed with a cold snare  RECOMMENDATIONS:     If the polyp(s) removed today are proven to be adenomatous (pre-cancerous) polyps, you will need a repeat colonoscopy in 5 years.  Otherwise you should continue to follow colorectal cancer screening guidelines for "routine risk" patients with colonoscopy in 10 years.  You will receive a letter within 1-2 weeks with the results of your biopsy as well as final recommendations.  Please call my office if you have not received a letter after 3 weeks. RECALL:  _____________________________ Inda Castle, MD eSigned:  Inda Castle, MD 2015/06/16 10:34 AM   cc:   CPT CODES: ICD CODES:  The ICD and CPT codes recommended by this software are interpretations from the data that the clinical staff has captured with the software.  The verification of the translation of this report to the ICD and CPT codes and modifiers is the sole responsibility of the health care institution and practicing physician where this report was generated.  [place]. will not be held responsible for the validity of the ICD and CPT codes included on this report.  AMA assumes no liability for data contained or not contained herein. CPT is a  Designer, television/film set of the [employer].   PATIENT NAME:  Ernest [name], Ernest [name] MR#: [id number]

## 2015-05-22 NOTE — Anesthesia Postprocedure Evaluation (Signed)
  Anesthesia Post-op Note  Patient: Ernest [name]  Procedure(s) Performed: Procedure(s) (LRB): COLONOSCOPY WITH PROPOFOL (N/A)  Patient Location: PACU  Anesthesia Type: MAC  Level of Consciousness: awake and alert   Airway and Oxygen Therapy: Patient Spontanous Breathing  Post-op Pain: mild  Post-op Assessment: Post-op Vital signs reviewed, Patient's Cardiovascular Status Stable, Respiratory Function Stable, Patent Airway and No signs of Nausea or vomiting  Last Vitals:  Filed Vitals:   05/22/15 1100  BP: 131/53  Pulse: 70  Temp:   Resp: 24    Post-op Vital Signs: stable   Complications: No apparent anesthesia complications

## 2015-05-23 ENCOUNTER — Encounter (HOSPITAL_COMMUNITY): Payer: Self-pay | Admitting: Gastroenterology

## 2015-05-25 ENCOUNTER — Encounter: Payer: Self-pay | Admitting: Gastroenterology

## 2015-05-25 ENCOUNTER — Telehealth: Payer: Self-pay | Admitting: Cardiology

## 2015-05-25 DIAGNOSIS — J9601 Acute respiratory failure with hypoxia: Secondary | ICD-10-CM | POA: Diagnosis not present

## 2015-05-25 DIAGNOSIS — C342 Malignant neoplasm of middle lobe, bronchus or lung: Secondary | ICD-10-CM | POA: Diagnosis not present

## 2015-05-25 DIAGNOSIS — C3490 Malignant neoplasm of unspecified part of unspecified bronchus or lung: Secondary | ICD-10-CM | POA: Diagnosis not present

## 2015-05-25 DIAGNOSIS — J449 Chronic obstructive pulmonary disease, unspecified: Secondary | ICD-10-CM | POA: Diagnosis not present

## 2015-05-25 NOTE — Telephone Encounter (Signed)
Closed encounter °

## 2015-05-26 ENCOUNTER — Encounter (HOSPITAL_COMMUNITY): Payer: Self-pay | Admitting: Emergency Medicine

## 2015-05-26 ENCOUNTER — Telehealth: Payer: Self-pay | Admitting: Internal Medicine

## 2015-05-26 ENCOUNTER — Emergency Department (HOSPITAL_COMMUNITY): Payer: Commercial Managed Care - HMO

## 2015-05-26 ENCOUNTER — Emergency Department (HOSPITAL_COMMUNITY)
Admission: EM | Admit: 2015-05-26 | Discharge: 2015-05-26 | Disposition: A | Discharge status: Home / Self Care | Payer: Commercial Managed Care - HMO | Attending: Emergency Medicine | Admitting: Emergency Medicine

## 2015-05-26 DIAGNOSIS — Z87891 Personal history of nicotine dependence: Secondary | ICD-10-CM | POA: Diagnosis not present

## 2015-05-26 DIAGNOSIS — Z923 Personal history of irradiation: Secondary | ICD-10-CM | POA: Insufficient documentation

## 2015-05-26 DIAGNOSIS — Z7951 Long term (current) use of inhaled steroids: Secondary | ICD-10-CM | POA: Diagnosis not present

## 2015-05-26 DIAGNOSIS — F329 Major depressive disorder, single episode, unspecified: Secondary | ICD-10-CM | POA: Diagnosis not present

## 2015-05-26 DIAGNOSIS — R079 Chest pain, unspecified: Secondary | ICD-10-CM | POA: Diagnosis not present

## 2015-05-26 DIAGNOSIS — J449 Chronic obstructive pulmonary disease, unspecified: Secondary | ICD-10-CM | POA: Diagnosis not present

## 2015-05-26 DIAGNOSIS — Z7952 Long term (current) use of systemic steroids: Secondary | ICD-10-CM | POA: Diagnosis not present

## 2015-05-26 DIAGNOSIS — K219 Gastro-esophageal reflux disease without esophagitis: Secondary | ICD-10-CM | POA: Insufficient documentation

## 2015-05-26 DIAGNOSIS — G4733 Obstructive sleep apnea (adult) (pediatric): Secondary | ICD-10-CM | POA: Insufficient documentation

## 2015-05-26 DIAGNOSIS — Z792 Long term (current) use of antibiotics: Secondary | ICD-10-CM | POA: Insufficient documentation

## 2015-05-26 DIAGNOSIS — Z9981 Dependence on supplemental oxygen: Secondary | ICD-10-CM | POA: Diagnosis not present

## 2015-05-26 DIAGNOSIS — E119 Type 2 diabetes mellitus without complications: Secondary | ICD-10-CM | POA: Insufficient documentation

## 2015-05-26 DIAGNOSIS — Z85118 Personal history of other malignant neoplasm of bronchus and lung: Secondary | ICD-10-CM | POA: Insufficient documentation

## 2015-05-26 DIAGNOSIS — M542 Cervicalgia: Secondary | ICD-10-CM | POA: Diagnosis not present

## 2015-05-26 DIAGNOSIS — Z7982 Long term (current) use of aspirin: Secondary | ICD-10-CM | POA: Diagnosis not present

## 2015-05-26 DIAGNOSIS — I2510 Atherosclerotic heart disease of native coronary artery without angina pectoris: Secondary | ICD-10-CM | POA: Insufficient documentation

## 2015-05-26 DIAGNOSIS — R52 Pain, unspecified: Secondary | ICD-10-CM

## 2015-05-26 DIAGNOSIS — E785 Hyperlipidemia, unspecified: Secondary | ICD-10-CM | POA: Diagnosis not present

## 2015-05-26 DIAGNOSIS — M109 Gout, unspecified: Secondary | ICD-10-CM | POA: Diagnosis not present

## 2015-05-26 DIAGNOSIS — I10 Essential (primary) hypertension: Secondary | ICD-10-CM | POA: Insufficient documentation

## 2015-05-26 DIAGNOSIS — Z79899 Other long term (current) drug therapy: Secondary | ICD-10-CM | POA: Insufficient documentation

## 2015-05-26 DIAGNOSIS — F419 Anxiety disorder, unspecified: Secondary | ICD-10-CM | POA: Insufficient documentation

## 2015-05-26 DIAGNOSIS — R221 Localized swelling, mass and lump, neck: Secondary | ICD-10-CM | POA: Diagnosis not present

## 2015-05-26 DIAGNOSIS — G4700 Insomnia, unspecified: Secondary | ICD-10-CM | POA: Diagnosis not present

## 2015-05-26 DIAGNOSIS — Z981 Arthrodesis status: Secondary | ICD-10-CM | POA: Diagnosis not present

## 2015-05-26 DIAGNOSIS — R0602 Shortness of breath: Secondary | ICD-10-CM | POA: Diagnosis not present

## 2015-05-26 LAB — I-STAT CHEM 8, ED
BUN: 8 mg/dL (ref 6–20)
Calcium, Ion: 1.14 mmol/L (ref 1.13–1.30)
Chloride: 95 mmol/L — ABNORMAL LOW (ref 101–111)
Creatinine, Ser: 1 mg/dL (ref 0.61–1.24)
Glucose, Bld: 161 mg/dL — ABNORMAL HIGH (ref 65–99)
HEMATOCRIT: 42 % (ref 39.0–52.0)
HEMOGLOBIN: 14.3 g/dL (ref 13.0–17.0)
POTASSIUM: 3.6 mmol/L (ref 3.5–5.1)
SODIUM: 138 mmol/L (ref 135–145)
TCO2: 34 mmol/L (ref 0–100)

## 2015-05-26 LAB — CBC
HCT: 41 % (ref 39.0–52.0)
HEMOGLOBIN: 13.3 g/dL (ref 13.0–17.0)
MCH: 30.6 pg (ref 26.0–34.0)
MCHC: 32.4 g/dL (ref 30.0–36.0)
MCV: 94.3 fL (ref 78.0–100.0)
Platelets: 90 10*3/uL — ABNORMAL LOW (ref 150–400)
RBC: 4.35 MIL/uL (ref 4.22–5.81)
RDW: 14.8 % (ref 11.5–15.5)
WBC: 9.7 10*3/uL (ref 4.0–10.5)

## 2015-05-26 MED ORDER — IOHEXOL 300 MG/ML  SOLN
100.0000 mL | Freq: Once | INTRAMUSCULAR | Status: AC | PRN
  Administered 2015-05-26: 100 mL via INTRAVENOUS

## 2015-05-26 NOTE — Telephone Encounter (Signed)
Ernest [name] - Client [name] Patient Name: Ernest [name] Gender: Male DOB: 05/29/1956 Age: 59 Y 60 M 1 D Return Phone Number: [phone] (Primary), [phone] (Secondary) Address: [address] City/State/Zip: [place] [address] Client [place] [name] - Client Client Site [facility] - [name] Physician Ernest [name], Ernest [name] Plan "Ernest [name]" Contact Type Call Call Type Triage / Clinical Caller Name Ernest [name] Relationship To Patient Spouse Appointment Disposition EMR Appointment Not Necessary Return Phone Number [phone] (Primary) Chief Complaint Neck Stiffness Initial Comment Caller says her husband has swelling on both sides of his neck for the past couple of days, and headache. PT can move his neck slowly. No fever. PT had a colonoscopy on Monday; but nothing else out of the ordinary Nurse Assessment Nurse: Ernest Dawley, RN, Ernest [name] Date/Time Ernest [name] Time): 05/26/2015 11:32:41 AM Confirm and document reason for call. If symptomatic, describe symptoms. ---SPOUSE STATES THAT THESE SYMPTOMS HAVE BEEN GOING ON FOR ABOUT A WEEK. SPOUSE STATES THAT HE HAS BEEN GOING ON FOR A WEEK. HE HAD A SURGERY 2010. HE IS HAVING SOME SWELLING FROM THE LEFT ALL THE WAY AROUND. IF HE MOVES HIS NECK HE IS HAVING SOME PAIN. IF HE TURNS HIS HEAD TO THE RIGHT IT HURTS UP INTO THE BACK OF HIS HEAD. THE SWELLING IN THE NECK WAS THERE THIS MORNING. HE CANT TURN HIS HEAD SIDE TO SIDE. HE HAS BEEN GETTING DIZZY. CHEST PAIN. SOB. Has the patient traveled out of the country within the last 30 days? ---Not Applicable Does the patient require triage? ---Yes Related visit to physician within the last 2 weeks? ---No Does the PT have any chronic conditions? (i.e. diabetes, asthma, etc.) ---Yes List chronic conditions. ---BP, COPD, LUNG CA, EMPHYSEMA, Guidelines Guideline Title Affirmed  Question Affirmed Notes Nurse Date/Time (Eastern Time) Disp. Time Ernest [name] Time) Disposition Final User After Care Instructions Given Call Event Type User Date / Time Description PLEASE NOTE: All timestamps contained within this report are represented as [place] Standard Time. CONFIDENTIALTY NOTICE: This fax transmission is intended only for the addressee. It contains information that is legally privileged, confidential or otherwise protected from use or disclosure. If you are not the intended recipient, you are strictly prohibited from reviewing, disclosing, copying using or disseminating any of this information or taking any action in reliance on or regarding this information. If you have received this fax in error, please notify us immediately by telephone so that we can arrange for its return to [place]. Phone: [phone], Toll-Free: [phone], Fax: [phone] Page: 2 of 2 Call Id: [id number] Comments User: Ernest Borders, RN Date/Time Ernest [name] Time): 05/26/2015 11:56:34 AM STARTED CALL OUT TALKING WITH THE SPOUSE AND SHE STATES THAT HIS PROBLEMS HAD BEEN GOING ON FOR ABOUT 3 WEEKS. TRAIGE NURSE TOLD HER THAT HE NEEDS TO GO INTO THE ED AND SHE STATES THAT HE NEEDED TO SPEAK WITH TRIAGE NURSE AS HIS SYMPTOMS HAD BEEN GOING ON FOR 3 WEEKS OR MORE. [name] GOT ON THE PHONE AND PROCEEDED TO TELL NURSE THAT HIS SYMPTOMS HAD BEEN GOING ON FOR 4 WEEKS AND HE WAS HAVING CHEST PAIN THAT WOULD RADIATE INTO THE NECK AND IT WOULD CAUSE HIM TO HAVE NECK PAIN. HIS IS HAVING NECK PAIN AND STIFFNESS WITHOUT FEVER. HE IS SOB WHILE TALKING ON THE PHONE HE HAD NO PROBLEM WITH THE ED OUTCOME.

## 2015-05-26 NOTE — ED Provider Notes (Signed)
CSN: [id number]     Arrival date & time 05/26/15  1229 History   First MD Initiated Contact with Patient 05/26/15 1231     Chief Complaint  Patient presents with  . Neck Pain  . Shortness of Breath     (Consider location/radiation/quality/duration/timing/severity/associated sxs/prior Treatment) HPI Complains of neck pain onset one week ago exacerbated by rotating his neck or flexing his neck. No fever. Pain radiates to his chest when he flexes his neck or rotates his neck. Denies any shortness of breath nausea or sweatiness. No other associated symptoms. When remaining still he is asymptomatic. He denies any shortness of breath whatsoever, stating his breathing is at baseline. No treatment prior to coming here patient had colonoscopy with MAC anesthesia on 05/22/2015, he reports that he had symptoms of neck pain prior to the procedure. Symptoms are nonexertional, occur only with moving his neck Past Medical History  Diagnosis Date  . CAD (coronary artery disease)     Left Main 30% stenosis, LAD 20 - 30 % stenosis, first and second diagonal branchesat 40 - 50%  stenosis with small arteries, circumflex had 30% stenosis in the large obtuse marginal, RCA at 70 - 80%  stenosis [not felt to be occlusive after evaluation with flow wire], distal 50 - 60% stenosis - [name][  . COPD (chronic obstructive pulmonary disease)     Dr. Baird Lyons  . Depression   . Anxiety   . Hyperlipidemia   . Chronic insomnia   . Gout   . GERD (gastroesophageal reflux disease)   . PVD (peripheral vascular disease)     (ABI 0.9 on right and 0.89 on left)  severe left external iliac stenosis.  He  had successful stenting of his left external iliac per Dr. Burt Knack.  . DDD (degenerative disc disease)   . Hx of colonoscopy   . 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 OSA (obstructive sleep apnea)     NPSG 09/10/10- AHI 11.3/hr  . Hypertension     dr Percival Spanish  . History of radiation therapy 11/10/13- 12/29/13    left  lung 6600 cGy in 33 sessions  . Cancer     lung  . Lung cancer 10/04/13    L[facility] squamous cell lung cancer  . Diabetes mellitus without complication 1/96/2229   Past Surgical History  Procedure Laterality Date  . Spinal fusion  03/05/2007    L4-L5  . Hip surgery      left 'bone graft taken"  . Arm surgery      left elbow  . Shoulder surgery      right  . C-spine surgery    . Angioplasty    . Video bronchoscopy with endobronchial navigation N/A 10/04/2013    Procedure: VIDEO BRONCHOSCOPY WITH ENDOBRONCHIAL NAVIGATION;  Surgeon: Collene Gobble, MD;  Location: [facility];  Service: Thoracic;  Laterality: N/A;  . Back surgery      lower  . Anterior fusion cervical spine      cervical fusion  7 yrs ago ([facility])  . Colon surgery      '11 "Diverticulitis"  . Colonoscopy with propofol N/A 05/22/2015    Procedure: COLONOSCOPY WITH PROPOFOL;  Surgeon: Inda Castle, MD;  Location: [facility];  Service: Endoscopy;  Laterality: N/A;   Family History  Problem Relation Age of Onset  . Heart attack Mother   . Heart attack Sister   . Lung cancer Sister   . Cancer Sister     small cell lung,  mets to brain  . Emphysema Sister   . Hypertension Brother   . Stroke Neg Hx    History  Substance Use Topics  . Smoking status: Former Smoker -- 0.50 packs/day for 43 years    Types: Cigarettes    Quit date: 09/23/2013  . Smokeless tobacco: Never Used     Comment: history of 3 PPD, 11/02/13   . Alcohol Use: No    Review of Systems  Constitutional: Negative.   HENT: Negative.   Respiratory: Negative.   Cardiovascular: Positive for chest pain.       Chest pain only with moving his neck  Gastrointestinal: Negative.   Musculoskeletal: Positive for neck pain.  Skin: Negative.   Neurological: Negative.   Psychiatric/Behavioral: Negative.   All other systems reviewed and are negative.     Allergies  Carboplatin  Home Medications   Prior to Admission medications   Medication Sig Start  Date End Date Taking? Authorizing Provider  albuterol (PROVENTIL) (2.5 MG/3ML) 0.083% nebulizer solution INHALE 1 VIAL IN NEBULIZER EVERY 4 HOURS AS NEEDED FOR WHEEZING OR SHORTNESS OF BREATH 01/19/15   Deneise Lever, MD  ALPRAZolam Duanne Moron) 0.25 MG tablet Take 0.25 mg by mouth daily as needed for anxiety.  03/10/14   Doe-Hyun R Shawna Orleans, DO  ARIPiprazole (ABILIFY) 5 MG tablet Take 1 tablet (5 mg total) by mouth at bedtime. 03/30/13   Doe-Hyun R Shawna Orleans, DO  Armodafinil (NUVIGIL) 150 MG tablet Take 150 mg by mouth every morning.     Historical Provider, MD  aspirin 81 MG EC tablet TAKE 1 TABLET EVERY DAY 05/19/15   Marletta Lor, MD  atorvastatin (LIPITOR) 20 MG tablet TAKE 1 TABLET (20 MG TOTAL) BY MOUTH DAILY. Patient taking differently: TAKE 1 TABLET (20 MG TOTAL) BY MOUTH NIGHTLY. 08/16/14   Doe-Hyun R Shawna Orleans, DO  BD PEN NEEDLE NANO U/F 32G X 4 MM MISC 1 EACH BY DOES NOT APPLY ROUTE DAILY. 05/01/15   Doe-Hyun R Shawna Orleans, DO  clonazePAM (KLONOPIN) 1 MG tablet Take 2 mg by mouth at bedtime as needed for anxiety.     Historical Provider, MD  escitalopram (LEXAPRO) 20 MG tablet Take 1 tablet (20 mg total) by mouth daily. 01/25/15   Doe-Hyun R Shawna Orleans, DO  esomeprazole (NEXIUM) 40 MG capsule Take 40 mg by mouth every morning.  06/20/14   Doe-Hyun R Shawna Orleans, DO  FLUoxetine (PROZAC) 40 MG capsule Take 40 mg by mouth every morning.    Historical Provider, MD  fluticasone (FLONASE) 50 MCG/ACT nasal spray Place 2 sprays into both nostrils daily. Patient taking differently: Place 2 sprays into both nostrils daily as needed for allergies or rhinitis.  06/20/14 06/20/15  Doe-Hyun R Shawna Orleans, DO  Fluticasone-Salmeterol (ADVAIR DISKUS) 500-50 MCG/DOSE AEPB 1 puff then rinse mouth, twice daily maintenance 01/19/15   Deneise Lever, MD  Insulin Glargine (LANTUS SOLOSTAR) 100 UNIT/ML Solostar Pen 5 units at bedtime. 03/18/14   Doe-Hyun R Shawna Orleans, DO  isosorbide mononitrate (IMDUR) 60 MG 24 hr tablet Take 1 tablet (60 mg total) by mouth daily. 11/11/14    Minus Breeding, MD  levofloxacin (LEVAQUIN) 750 MG tablet Take 1 tablet (750 mg total) by mouth daily. 04/14/15   Bonnielee Haff, MD  metFORMIN (GLUCOPHAGE-XR) 500 MG 24 hr tablet Take 2 tablets (1,000 mg total) by mouth daily with breakfast. 05/12/15   Marletta Lor, MD  methocarbamol (ROBAXIN) 500 MG tablet Take 500 mg by mouth 2 (two) times daily as needed for muscle spasms.  10/27/13   Historical Provider, MD  metoprolol succinate (TOPROL-XL) 25 MG 24 hr tablet TAKE 1/2 TABLET BY MOUTH DAILY 08/09/14   Minus Breeding, MD  mirtazapine (REMERON) 45 MG tablet Take 1 tablet (45 mg total) by mouth at bedtime. 12/29/14   Tammy S Parrett, NP  morphine (MS CONTIN) 30 MG 12 hr tablet Take 1 tablet (30 mg total) by mouth every 12 (twelve) hours. 04/15/15   Bonnielee Haff, MD  morphine (MSIR) 15 MG tablet Take 1 tablet (15 mg total) by mouth every 4 (four) hours as needed for severe pain. 04/15/15   Bonnielee Haff, MD  nitroGLYCERIN (NITROSTAT) 0.4 MG SL tablet Place 1 tablet (0.4 mg total) under the tongue every 5 (five) minutes as needed for chest pain. 08/09/14   Minus Breeding, MD  nystatin (MYCOSTATIN) 100000 UNIT/ML suspension Take 5 mLs (500,000 Units total) by mouth 4 (four) times daily. 01/25/15   Doe-Hyun R Shawna Orleans, DO  polyethylene glycol powder (GLYCOLAX/MIRALAX) powder Take as directed for colonoscopy. 03/21/15   Amy S Esterwood, PA-C  potassium chloride SA (K-DUR,KLOR-CON) 20 MEQ tablet Take 20 mEq by mouth every morning.     Historical Provider, MD  predniSONE (DELTASONE) 10 MG tablet Take 4 tablets twice daily for 3 days, then take 4 tablets once daily for 4 days, then take 2 tablets once daily for 4 days, then take 1 tablets once daily as before. Patient taking differently: Take 10 mg by mouth every morning.  04/14/15   Bonnielee Haff, MD  prochlorperazine (COMPAZINE) 10 MG tablet TAKE 1 TABLET BY MOUTH EVERY 6 HOURS AS NEEDED FOR NAUSEA AND VOMITING 07/28/14   Curt Bears, MD  theophylline (THEO-24)  400 MG 24 hr capsule Take 1 capsule (400 mg total) by mouth daily. 02/24/15   Deneise Lever, MD  Tiotropium Bromide-Olodaterol (STIOLTO RESPIMAT) 2.5-2.5 MCG/ACT AERS Inhale 2 puffs into the lungs daily. 02/06/15   Deneise Lever, MD  VENTOLIN HFA 108 (90 BASE) MCG/ACT inhaler INHALE 2 PUFFS INTO THE LUNGS 4 TIMES DAILY AS NEEDED FOR WHEEZING 04/07/15   Deneise Lever, MD  VOLTAREN 1 % GEL Apply 2 g topically daily as needed (for pain).  06/24/12   Historical Provider, MD  oxygen 3 liter BP 124/57 mmHg  Pulse 82  Temp(Src) 98.5 F (36.9 C) (Oral)  Resp 24  SpO2 95% Physical Exam  Constitutional: He appears well-developed and well-nourished.  HENT:  Head: Normocephalic and atraumatic.  Eyes: Conjunctivae are normal. Pupils are equal, round, and reactive to light.  Neck: Neck supple. No tracheal deviation present. No thyromegaly present.  Pain at posterior neck with rotation of neck or with flexion. Full range of motion. No signs of meningitis  Cardiovascular: Normal rate and regular rhythm.   No murmur heard. Pulmonary/Chest: Effort normal and breath sounds normal.  Abdominal: Soft. Bowel sounds are normal. He exhibits no distension. There is no tenderness.  Obese  Musculoskeletal: Normal range of motion. He exhibits no edema or tenderness.  Neurological: He is alert. Coordination normal.  Skin: Skin is warm and dry. No rash noted.  Psychiatric: He has a normal mood and affect.  Nursing note and vitals reviewed.   ED Course  Procedures (including critical care time) Labs Review Labs Reviewed - No data to display  Imaging Review No results found.   EKG Interpretation None     ED ECG REPORT   Date: 05/26/2015  Rate: 80  Rhythm: normal sinus rhythm  QRS Axis: normal  Intervals: normal  ST/T Wave abnormalities: nonspecific T wave changes  Conduction Disutrbances:none  Narrative Interpretation:   Old EKG Reviewed: unchanged no significant change since5/18/16  I have  personally reviewed the EKG tracing and agree with the computerized printout as noted. 4:40 PM pain under control. He declined pain medicine here. Results for orders placed or performed during the hospital encounter of 05/26/15  CBC  Result Value Ref Range   WBC 9.7 4.0 - 10.5 K/uL   RBC 4.35 4.22 - 5.81 MIL/uL   Hemoglobin 13.3 13.0 - 17.0 g/dL   HCT 41.0 39.0 - 52.0 %   MCV 94.3 78.0 - 100.0 fL   MCH 30.6 26.0 - 34.0 pg   MCHC 32.4 30.0 - 36.0 g/dL   RDW 14.8 11.5 - 15.5 %   Platelets 90 (L) 150 - 400 K/uL  I-stat chem 8, ed  Result Value Ref Range   Sodium 138 135 - 145 mmol/L   Potassium 3.6 3.5 - 5.1 mmol/L   Chloride 95 (L) 101 - 111 mmol/L   BUN 8 6 - 20 mg/dL   Creatinine, Ser 1.00 0.61 - 1.24 mg/dL   Glucose, Bld 161 (H) 65 - 99 mg/dL   Calcium, Ion 1.14 1.13 - 1.30 mmol/L   TCO2 34 0 - 100 mmol/L   Hemoglobin 14.3 13.0 - 17.0 g/dL   HCT 42.0 39.0 - 52.0 %   Ct Soft Tissue Neck W Contrast  05/26/2015   CLINICAL DATA:  Pt reports he has had posterior neck pain with swelling for the past week. Pt then goes onto say that he has SOB and pain from neck radiates down into chest with certain movement, pt. Has hx of lung ca dx 2014 with chemo completed 2014, surgery-neck fusion  EXAM: CT NECK WITH CONTRAST  TECHNIQUE: Multidetector CT imaging of the neck was performed using the standard protocol following the bolus administration of intravenous contrast.  CONTRAST:  178m OMNIPAQUE IOHEXOL 300 MG/ML  SOLN  COMPARISON:  Chest CT, 10/19/2013  FINDINGS: Pharynx and larynx: No mass.  No significant soft tissue asymmetry.  Salivary glands: Unremarkable.  Thyroid: Normal.  Lymph nodes: No pathologically enlarged or abnormal appearing lymph nodes.  Vascular: There are carotid vascular calcifications. Internal carotid arteries are mildly tortuous. No significant stenosis. Very small right vertebral artery, which is stable from the previous brain MRI dated 10/19/2013.  Limited intracranial:  Unremarkable.  Visualized orbits: Unremarkable.  Mastoids and visualized paranasal sinuses: Clear.  Skeleton: Status post anterior cervical spine fusion from C5-C7. Orthopedic hardware is well-seated. Mature bone graft material replaces the fused disc spaces. There are mild disc degenerative changes from C2-C3 through C4-C5. No osteoblastic or osteolytic lesions.  Upper chest: Left upper lobe opacity and small loculated left upper hemi thorax pleural effusion. This is stable from the prior chest CT.  IMPRESSION: 1. No acute findings. 2. No mass or adenopathy. No evidence of metastatic disease to the neck. No areas of abnormal soft tissue attenuation or significant asymmetry. No inflammatory changes. 3. Status post anterior cervical spine fusion from C5-C7. Fusion hardware is well-seated and aligned.   Electronically Signed   By: DLajean ManesM.D.   On: 05/26/2015 14:27    MDM  Pain is felt to be musculoskeletal in etiology Plan follow-up with Dr. YShawna Orleansif significant discomfort in one week. Patient has morphine at home which she takes for chronic back pain No signs of infection. Thrombocytopenia is chronic Final diagnoses:  None   diagnosis #1neck pain #2 hyperglycemia #  3 thrombocytopenia      Orlie Dakin, MD 05/26/15 1545

## 2015-05-26 NOTE — ED Notes (Signed)
Pt reports he has had posterior neck pain with swelling for the past week. Pt then goes onto say that he has SOB and pain from neck radiates down into chest with certain movement.

## 2015-05-26 NOTE — Discharge Instructions (Signed)
Your blood sugar today was mildly elevated at 161. No signs of infection seen on your CT scan of your neck. Take Tylenol for mild pain or the morphine as prescribed for your back pain for severe pain. Contact Dr.Yoo if having significant pain by next week. Return if your condition worsens for any reason.

## 2015-05-26 NOTE — Telephone Encounter (Signed)
Patient checked into ED 

## 2015-05-31 ENCOUNTER — Other Ambulatory Visit (HOSPITAL_BASED_OUTPATIENT_CLINIC_OR_DEPARTMENT_OTHER): Payer: Commercial Managed Care - HMO

## 2015-05-31 DIAGNOSIS — C3492 Malignant neoplasm of unspecified part of left bronchus or lung: Secondary | ICD-10-CM

## 2015-05-31 LAB — COMPREHENSIVE METABOLIC PANEL (CC13)
ALT: 23 U/L (ref 0–55)
ANION GAP: 12 meq/L — AB (ref 3–11)
AST: 18 U/L (ref 5–34)
Albumin: 3.5 g/dL (ref 3.5–5.0)
Alkaline Phosphatase: 102 U/L (ref 40–150)
BUN: 6.8 mg/dL — AB (ref 7.0–26.0)
CALCIUM: 9.6 mg/dL (ref 8.4–10.4)
CO2: 30 mEq/L — ABNORMAL HIGH (ref 22–29)
CREATININE: 1 mg/dL (ref 0.7–1.3)
Chloride: 101 mEq/L (ref 98–109)
EGFR: 78 mL/min/{1.73_m2} — AB (ref >90)
GLUCOSE: 150 mg/dL — AB (ref 70–140)
Potassium: 3.7 mEq/L (ref 3.5–5.1)
Sodium: 143 mEq/L (ref 136–145)
Total Bilirubin: 0.45 mg/dL (ref 0.20–1.20)
Total Protein: 6.4 g/dL (ref 6.4–8.3)

## 2015-05-31 LAB — CBC WITH DIFFERENTIAL/PLATELET
BASO%: 1.1 % (ref 0.0–2.0)
Basophils Absolute: 0.1 10*3/uL (ref 0.0–0.1)
EOS%: 2.2 % (ref 0.0–7.0)
Eosinophils Absolute: 0.2 10*3/uL (ref 0.0–0.5)
HCT: 42.6 % (ref 38.4–49.9)
HGB: 14.1 g/dL (ref 13.0–17.1)
LYMPH#: 1.4 10*3/uL (ref 0.9–3.3)
LYMPH%: 14 % (ref 14.0–49.0)
MCH: 30.5 pg (ref 27.2–33.4)
MCHC: 33.2 g/dL (ref 32.0–36.0)
MCV: 91.8 fL (ref 79.3–98.0)
MONO#: 0.8 10*3/uL (ref 0.1–0.9)
MONO%: 8 % (ref 0.0–14.0)
NEUT%: 74.7 % (ref 39.0–75.0)
NEUTROS ABS: 7.6 10*3/uL — AB (ref 1.5–6.5)
Platelets: 127 10*3/uL — ABNORMAL LOW (ref 140–400)
RBC: 4.64 10*6/uL (ref 4.20–5.82)
RDW: 15.2 % — AB (ref 11.0–14.6)
WBC: 10.2 10*3/uL (ref 4.0–10.3)

## 2015-06-02 ENCOUNTER — Encounter (HOSPITAL_COMMUNITY): Payer: Commercial Managed Care - HMO

## 2015-06-02 DIAGNOSIS — M961 Postlaminectomy syndrome, not elsewhere classified: Secondary | ICD-10-CM | POA: Diagnosis not present

## 2015-06-02 DIAGNOSIS — G894 Chronic pain syndrome: Secondary | ICD-10-CM | POA: Diagnosis not present

## 2015-06-02 DIAGNOSIS — M1611 Unilateral primary osteoarthritis, right hip: Secondary | ICD-10-CM | POA: Diagnosis not present

## 2015-06-02 DIAGNOSIS — M47817 Spondylosis without myelopathy or radiculopathy, lumbosacral region: Secondary | ICD-10-CM | POA: Diagnosis not present

## 2015-06-03 DIAGNOSIS — J449 Chronic obstructive pulmonary disease, unspecified: Secondary | ICD-10-CM | POA: Diagnosis not present

## 2015-06-05 ENCOUNTER — Telehealth: Payer: Self-pay | Admitting: Internal Medicine

## 2015-06-05 DIAGNOSIS — M1611 Unilateral primary osteoarthritis, right hip: Secondary | ICD-10-CM

## 2015-06-05 NOTE — Telephone Encounter (Signed)
Ok for ortho referral 

## 2015-06-05 NOTE — Telephone Encounter (Signed)
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 Pt unsure, if he has to see MD prior to getting referral placed. Please advise.

## 2015-06-06 ENCOUNTER — Telehealth: Payer: Self-pay | Admitting: Internal Medicine

## 2015-06-06 DIAGNOSIS — C3492 Malignant neoplasm of unspecified part of left bronchus or lung: Secondary | ICD-10-CM

## 2015-06-06 NOTE — Telephone Encounter (Signed)
Pt need a referral to see Dr Curt Bears at the [facility] for Left Lung Cancer

## 2015-06-06 NOTE — Telephone Encounter (Signed)
Ok for referral?

## 2015-06-06 NOTE — Telephone Encounter (Signed)
Referral placed.

## 2015-06-07 ENCOUNTER — Telehealth (HOSPITAL_COMMUNITY): Payer: Self-pay | Admitting: *Deleted

## 2015-06-07 ENCOUNTER — Ambulatory Visit (HOSPITAL_BASED_OUTPATIENT_CLINIC_OR_DEPARTMENT_OTHER): Payer: Commercial Managed Care - HMO | Admitting: Internal Medicine

## 2015-06-07 ENCOUNTER — Telehealth: Payer: Self-pay | Admitting: Internal Medicine

## 2015-06-07 ENCOUNTER — Encounter: Payer: Self-pay | Admitting: Internal Medicine

## 2015-06-07 VITALS — BP 148/64 | HR 80 | Temp 98.8°F | Resp 18 | Ht 66.0 in | Wt 208.4 lb

## 2015-06-07 DIAGNOSIS — C3492 Malignant neoplasm of unspecified part of left bronchus or lung: Secondary | ICD-10-CM | POA: Diagnosis not present

## 2015-06-07 NOTE — Progress Notes (Signed)
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  PRIOR THERAPY:  1) Concurrent chemoradiation with weekly carboplatin for AUC of 2 and paclitaxel 45 mg/M2, status post 7 cycles, last dose was given 12/20/2013. First dose on 11/01/2013. 2) Consolidation chemotherapy with carboplatin for AUC of 5 and paclitaxel 175 mg/M2 every 3 weeks with Neulasta support. First cycle on 02/08/2014. Status post 3 cycles and carboplatin was discontinued secondary to allergic reaction.    CURRENT THERAPY: Observation  CHEMOTHERAPY INTENT: Curative/control  CURRENT # OF CHEMOTHERAPY CYCLES: 0  CURRENT ANTIEMETICS: Zofran, dexamethasone and Compazine  CURRENT SMOKING STATUS: Former smoker  ORAL CHEMOTHERAPY AND CONSENT: None  CURRENT BISPHOSPHONATES USE: None  PAIN MANAGEMENT: 0/10  NARCOTICS INDUCED CONSTIPATION: None.  LIVING WILL AND CODE STATUS: Full code.   INTERVAL HISTORY: Ernest [name] 59 y.o. male returns to the clinic today for three-month followup visit accompanied by a family member. The patient continues to complain of shortness of breath with exertion. He gained a lot of weight recently and this is affecting his breathing.[name] He denied having any significant chest pain or hemoptysis. He denied having any nausea or vomiting, no fever or chills. He had repeat CT scan of the chest performed in May 2016 that showed no evidence for disease progression. He was also seen at the emergency department earlier this month for evaluation of neck swelling and shortness of breath and CT scan of the neck was performed at that time and showed no evidence for disease in the neck area. The  patient is here today for evaluation and discussion of his condition.   MEDICAL HISTORY: Past Medical History  Diagnosis Date  . CAD (coronary artery disease)     Left Main 30% stenosis, LAD 20 - 30 % stenosis, first and second diagonal branchesat 40 - 50%  stenosis with small arteries, circumflex had 30% stenosis in the large obtuse marginal, RCA at 70 - 80%  stenosis [not felt to be occlusive after evaluation with flow wire], distal 50 - 60% stenosis - [name][  . COPD (chronic obstructive pulmonary disease)     Dr. Baird Lyons  . Depression   . Anxiety   . Hyperlipidemia   . Chronic insomnia   . Gout   . GERD (gastroesophageal reflux disease)   . PVD (peripheral vascular disease)     (ABI 0.9 on right and 0.89 on left)  severe left external iliac stenosis.  He  had successful stenting of his left external iliac per Dr. Burt Knack.  . DDD (degenerative disc disease)   . Hx of colonoscopy   . 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 OSA (obstructive sleep apnea)     NPSG 09/10/10- AHI 11.3/hr  . Hypertension     dr Percival Spanish  . History of radiation therapy 11/10/13- 12/29/13    left lung 6600 cGy in 33 sessions  . Diabetes mellitus without complication 3/97/6734  . Cancer     lung  . Lung cancer 10/04/13    LUL squamous cell lung cancer    ALLERGIES:  is allergic to carboplatin.  MEDICATIONS:  Current Outpatient Prescriptions  Medication Sig Dispense Refill  . albuterol (PROVENTIL) (2.5 MG/3ML) 0.083% nebulizer solution INHALE 1 VIAL  IN NEBULIZER EVERY 4 HOURS AS NEEDED FOR WHEEZING OR SHORTNESS OF BREATH 300 mL 11  . ALPRAZolam (XANAX) 0.25 MG tablet Take 0.25 mg by mouth daily as needed for anxiety.     . ARIPiprazole (ABILIFY) 5 MG tablet Take 1 tablet (5 mg total) by mouth at bedtime.    . Armodafinil (NUVIGIL) 150 MG tablet Take 150 mg by mouth every morning.     [name] aspirin 81 MG EC tablet TAKE 1 TABLET EVERY DAY 30 tablet 5  . atorvastatin (LIPITOR) 20 MG tablet TAKE 1  TABLET (20 MG TOTAL) BY MOUTH DAILY. (Patient taking differently: TAKE 1 TABLET (20 MG TOTAL) BY MOUTH NIGHTLY.) 90 tablet 3  . BD PEN NEEDLE NANO U/F 32G X 4 MM MISC 1 EACH BY DOES NOT APPLY ROUTE DAILY. 100 each 3  . clonazePAM (KLONOPIN) 1 MG tablet Take 2 mg by mouth at bedtime as needed for anxiety.     [name] escitalopram (LEXAPRO) 20 MG tablet Take 1 tablet (20 mg total) by mouth daily. (Patient not taking: Reported on 05/26/2015) 90 tablet 0  . esomeprazole (NEXIUM) 40 MG capsule Take 40 mg by mouth every morning.     [name] FLUoxetine (PROZAC) 40 MG capsule Take 40 mg by mouth every morning.    . fluticasone (FLONASE) 50 MCG/ACT nasal spray Place 2 sprays into both nostrils daily. (Patient taking differently: Place 2 sprays into both nostrils daily as needed for allergies or rhinitis. ) 16 g 3  . Fluticasone-Salmeterol (ADVAIR DISKUS) 500-50 MCG/DOSE AEPB 1 puff then rinse mouth, twice daily maintenance 60 each 11  . Insulin Glargine (LANTUS SOLOSTAR) 100 UNIT/ML Solostar Pen 5 units at bedtime. 5 pen PRN  . isosorbide mononitrate (IMDUR) 60 MG 24 hr tablet Take 1 tablet (60 mg total) by mouth daily. 90 tablet 3  . levofloxacin (LEVAQUIN) 750 MG tablet Take 1 tablet (750 mg total) by mouth daily. (Patient not taking: Reported on 05/26/2015) 7 tablet 0  . metFORMIN (GLUCOPHAGE-XR) 500 MG 24 hr tablet Take 2 tablets (1,000 mg total) by mouth daily with breakfast. 180 tablet 1  . methocarbamol (ROBAXIN) 500 MG tablet Take 500 mg by mouth 2 (two) times daily as needed for muscle spasms.     . metoprolol succinate (TOPROL-XL) 25 MG 24 hr tablet TAKE 1/2 TABLET BY MOUTH DAILY 30 tablet 5  . mirtazapine (REMERON) 45 MG tablet Take 1 tablet (45 mg total) by mouth at bedtime.    [name] morphine (MS CONTIN) 30 MG 12 hr tablet Take 1 tablet (30 mg total) by mouth every 12 (twelve) hours.    [name] morphine (MSIR) 15 MG tablet Take 1 tablet (15 mg total) by mouth every 4 (four) hours as needed for severe pain. 30 tablet 0  .  nitroGLYCERIN (NITROSTAT) 0.4 MG SL tablet Place 1 tablet (0.4 mg total) under the tongue every 5 (five) minutes as needed for chest pain. 25 tablet 3  . nystatin (MYCOSTATIN) 100000 UNIT/ML suspension Take 5 mLs (500,000 Units total) by mouth 4 (four) times daily. 60 mL 1  . polyethylene glycol powder (GLYCOLAX/MIRALAX) powder Take as directed for colonoscopy. (Patient not taking: Reported on 05/26/2015) 255 g 0  . potassium chloride SA (K-DUR,KLOR-CON) 20 MEQ tablet Take 20 mEq by mouth every morning.     . predniSONE (DELTASONE) 10 MG tablet Take 4 tablets twice daily for 3 days, then take 4 tablets once daily for 4 days, then take 2 tablets once daily for 4 days,  then take 1 tablets once daily as before. (Patient not taking: Reported on 05/26/2015) 60 tablet 0  . prochlorperazine (COMPAZINE) 10 MG tablet TAKE 1 TABLET BY MOUTH EVERY 6 HOURS AS NEEDED FOR NAUSEA AND VOMITING 60 tablet 3  . theophylline (THEO-24) 400 MG 24 hr capsule Take 1 capsule (400 mg total) by mouth daily. 30 capsule 5  . Tiotropium Bromide-Olodaterol (STIOLTO RESPIMAT) 2.5-2.5 MCG/ACT AERS Inhale 2 puffs into the lungs daily. 1 Inhaler 5  . VENTOLIN HFA 108 (90 BASE) MCG/ACT inhaler INHALE 2 PUFFS INTO THE LUNGS 4 TIMES DAILY AS NEEDED FOR WHEEZING 18 Inhaler 11  . VOLTAREN 1 % GEL Apply 2 g topically daily as needed (for pain).      No current facility-administered medications for this visit.    SURGICAL HISTORY:  Past Surgical History  Procedure Laterality Date  . Spinal fusion  03/05/2007    L4-L5  . Hip surgery      left 'bone graft taken"  . Arm surgery      left elbow  . Shoulder surgery      right  . C-spine surgery    . Angioplasty    . Video bronchoscopy with endobronchial navigation N/A 10/04/2013    Procedure: VIDEO BRONCHOSCOPY WITH ENDOBRONCHIAL NAVIGATION;  Surgeon: Collene Gobble, MD;  Location: [facility];  Service: Thoracic;  Laterality: N/A;  . Back surgery      lower  . Anterior fusion cervical spine       cervical fusion  7 yrs ago ([facility])  . Colon surgery      '11 "Diverticulitis"  . Colonoscopy with propofol N/A 05/22/2015    Procedure: COLONOSCOPY WITH PROPOFOL;  Surgeon: Inda Castle, MD;  Location: [facility];  Service: Endoscopy;  Laterality: N/A;    REVIEW OF SYSTEMS:  A comprehensive review of systems was negative except for: Constitutional: positive for fatigue Respiratory: positive for dyspnea on exertion   PHYSICAL EXAMINATION: General appearance: alert, cooperative and no distress Head: Normocephalic, without obvious abnormality, atraumatic Neck: no adenopathy, no JVD, supple, symmetrical, trachea midline and thyroid not enlarged, symmetric, no tenderness/mass/nodules Lymph nodes: Cervical, supraclavicular, and axillary nodes normal. Resp: wheezes bilaterally Back: symmetric, no curvature. ROM normal. No CVA tenderness. Cardio: regular rate and rhythm, S1, S2 normal, no murmur, click, rub or gallop GI: soft, non-tender; bowel sounds normal; no masses,  no organomegaly Extremities: extremities normal, atraumatic, no cyanosis or edema Neurologic: Alert and oriented X 3, normal strength and tone. Normal symmetric reflexes. Normal coordination and gait  ECOG PERFORMANCE STATUS: 1 - Symptomatic but completely ambulatory  Blood pressure 148/64, pulse 80, temperature 98.8 F (37.1 C), temperature source Oral, resp. rate 18, height '5\' 6"'$  (1.676 m), weight 208 lb 6.4 oz (94.53 kg), SpO2 94 %.  LABORATORY DATA: Lab Results  Component Value Date   WBC 10.2 05/31/2015   HGB 14.1 05/31/2015   HCT 42.6 05/31/2015   MCV 91.8 05/31/2015   PLT 127* 05/31/2015      Chemistry      Component Value Date/Time   NA 143 05/31/2015 0852   NA 138 05/26/2015 1329   K 3.7 05/31/2015 0852   K 3.6 05/26/2015 1329   CL 95* 05/26/2015 1329   CO2 30* 05/31/2015 0852   CO2 33* 04/15/2015 0447   BUN 6.8* 05/31/2015 0852   BUN 8 05/26/2015 1329   CREATININE 1.0 05/31/2015 0852    CREATININE 1.00 05/26/2015 1329      Component Value Date/Time  CALCIUM 9.6 05/31/2015 0852   CALCIUM 9.0 04/15/2015 0447   ALKPHOS 102 05/31/2015 0852   ALKPHOS 125* 07/18/2014 1558   AST 18 05/31/2015 0852   AST 21 07/18/2014 1558   ALT 23 05/31/2015 0852   ALT 16 07/18/2014 1558   BILITOT 0.45 05/31/2015 0852   BILITOT 0.3 07/18/2014 1558       RADIOGRAPHIC STUDIES: Ct Soft Tissue Neck W Contrast  05/26/2015   CLINICAL DATA:  Pt reports he has had posterior neck pain with swelling for the past week. Pt then goes onto say that he has SOB and pain from neck radiates down into chest with certain movement, pt. Has hx of lung ca dx 2014 with chemo completed 2014, surgery-neck fusion  EXAM: CT NECK WITH CONTRAST  TECHNIQUE: Multidetector CT imaging of the neck was performed using the standard protocol following the bolus administration of intravenous contrast.  CONTRAST:  163m OMNIPAQUE IOHEXOL 300 MG/ML  SOLN  COMPARISON:  Chest CT, 10/19/2013  FINDINGS: Pharynx and larynx: No mass.  No significant soft tissue asymmetry.  Salivary glands: Unremarkable.  Thyroid: Normal.  Lymph nodes: No pathologically enlarged or abnormal appearing lymph nodes.  Vascular: There are carotid vascular calcifications. Internal carotid arteries are mildly tortuous. No significant stenosis. Very small right vertebral artery, which is stable from the previous brain MRI dated 10/19/2013.  Limited intracranial: Unremarkable.  Visualized orbits: Unremarkable.  Mastoids and visualized paranasal sinuses: Clear.  Skeleton: Status post anterior cervical spine fusion from C5-C7. Orthopedic hardware is well-seated. Mature bone graft material replaces the fused disc spaces. There are mild disc degenerative changes from C2-C3 through C4-C5. No osteoblastic or osteolytic lesions.  Upper chest: Left upper lobe opacity and small loculated left upper hemi thorax pleural effusion. This is stable from the prior chest CT.  IMPRESSION: 1.  No acute findings. 2. No mass or adenopathy. No evidence of metastatic disease to the neck. No areas of abnormal soft tissue attenuation or significant asymmetry. No inflammatory changes. 3. Status post anterior cervical spine fusion from C5-C7. Fusion hardware is well-seated and aligned.   Electronically Signed   By: DLajean ManesM.D.   On: 05/26/2015 14:27    ASSESSMENT AND PLAN: This is a very pleasant 60years old white male with stage IIIA non-small cell lung cancer, squamous cell carcinoma currently undergoing concurrent chemoradiation with weekly carboplatin and paclitaxel status post 7 weeks of treatment. He tolerated his treatment fairly well with no significant adverse effects. This was followed by consolidation chemotherapy with carboplatin and paclitaxel status post 3 cycles, .carboplatin was discontinued after cycle #2 secondary to hypersensitivity reaction. The recent CT scan of the chest and neck showed no evidence for disease progression. I discussed the scan results with the patient. I recommended for him to continue on observation with repeat CT scan of the chest in 3 months. For smoke cessation, I strongly encouraged the patient to quit smoking and offered him a smoke cessation program. For the weight loss, I strongly encouraged the patient to exercise at regular basis and also to eat healthy diet. He was advised to call immediately if he has any concerning symptoms in the interval. The patient voices understanding of current disease status and treatment options and is in agreement with the current care plan.  All questions were answered. The patient knows to call the clinic with any problems, questions or concerns. We can certainly see the patient much sooner if necessary.  Disclaimer: This note was dictated with voice recognition  software. Similar sounding words can inadvertently be transcribed and may not be corrected upon review.

## 2015-06-07 NOTE — Telephone Encounter (Signed)
Patient given detailed instructions per Myocardial Perfusion Study Information Sheet for test on 06/07/15 at 9:30. Patient Notified to arrive 15 minutes early, and that it is imperative to arrive on time for appointment to keep from having the test rescheduled. Patient verbalized understanding. Hubbard Robinson, RN

## 2015-06-07 NOTE — Telephone Encounter (Signed)
per piof to sch pt appt-gave pt copy of avs

## 2015-06-07 NOTE — Telephone Encounter (Signed)
Order entered

## 2015-06-09 DIAGNOSIS — M545 Low back pain: Secondary | ICD-10-CM | POA: Diagnosis not present

## 2015-06-09 DIAGNOSIS — M1611 Unilateral primary osteoarthritis, right hip: Secondary | ICD-10-CM | POA: Diagnosis not present

## 2015-06-12 ENCOUNTER — Ambulatory Visit (HOSPITAL_COMMUNITY): Payer: Commercial Managed Care - HMO | Attending: Cardiology

## 2015-06-12 ENCOUNTER — Encounter: Payer: Self-pay | Admitting: Physician Assistant

## 2015-06-12 DIAGNOSIS — R778 Other specified abnormalities of plasma proteins: Secondary | ICD-10-CM

## 2015-06-12 DIAGNOSIS — R9439 Abnormal result of other cardiovascular function study: Secondary | ICD-10-CM | POA: Diagnosis not present

## 2015-06-12 DIAGNOSIS — R079 Chest pain, unspecified: Secondary | ICD-10-CM | POA: Diagnosis not present

## 2015-06-12 DIAGNOSIS — R7989 Other specified abnormal findings of blood chemistry: Secondary | ICD-10-CM | POA: Diagnosis not present

## 2015-06-12 DIAGNOSIS — R0600 Dyspnea, unspecified: Secondary | ICD-10-CM | POA: Insufficient documentation

## 2015-06-12 DIAGNOSIS — Z87891 Personal history of nicotine dependence: Secondary | ICD-10-CM | POA: Diagnosis not present

## 2015-06-12 DIAGNOSIS — I739 Peripheral vascular disease, unspecified: Secondary | ICD-10-CM | POA: Diagnosis not present

## 2015-06-12 DIAGNOSIS — E119 Type 2 diabetes mellitus without complications: Secondary | ICD-10-CM | POA: Diagnosis not present

## 2015-06-12 DIAGNOSIS — I2510 Atherosclerotic heart disease of native coronary artery without angina pectoris: Secondary | ICD-10-CM | POA: Insufficient documentation

## 2015-06-12 DIAGNOSIS — I10 Essential (primary) hypertension: Secondary | ICD-10-CM | POA: Insufficient documentation

## 2015-06-12 LAB — MYOCARDIAL PERFUSION IMAGING
CHL CUP NUCLEAR SRS: 1
CHL CUP RESTING HR STRESS: 66 {beats}/min
CSEPPHR: 141 {beats}/min
Exercise duration (min): 18 min
Exercise duration (sec): 52 s
LV dias vol: 114 mL
LV sys vol: 50 mL
MPHR: 160 {beats}/min
NUC STRESS TID: 0.98
Percent HR: 88 %
RATE: 0.35
SDS: 0
SSS: 1

## 2015-06-12 MED ORDER — ATROPINE SULFATE 0.1 MG/ML IJ SOLN
0.2500 mg | Freq: Once | INTRAMUSCULAR | Status: AC
  Administered 2015-06-12: 0.25 mg via INTRAVENOUS

## 2015-06-12 MED ORDER — DOBUTAMINE INFUSION FOR EP/ECHO/NUC (1000 MCG/ML)
0.0000 ug/kg/min | INTRAVENOUS | Status: DC
  Administered 2015-06-12: 40 ug/kg/min via INTRAVENOUS

## 2015-06-12 MED ORDER — TECHNETIUM TC 99M SESTAMIBI GENERIC - CARDIOLITE
30.0000 | Freq: Once | INTRAVENOUS | Status: AC | PRN
  Administered 2015-06-12: 30 via INTRAVENOUS

## 2015-06-12 MED ORDER — TECHNETIUM TC 99M SESTAMIBI GENERIC - CARDIOLITE
11.0000 | Freq: Once | INTRAVENOUS | Status: AC | PRN
  Administered 2015-06-12: 11 via INTRAVENOUS

## 2015-06-18 DIAGNOSIS — J449 Chronic obstructive pulmonary disease, unspecified: Secondary | ICD-10-CM | POA: Diagnosis not present

## 2015-06-21 ENCOUNTER — Encounter: Payer: Self-pay | Admitting: Internal Medicine

## 2015-06-21 ENCOUNTER — Ambulatory Visit (INDEPENDENT_AMBULATORY_CARE_PROVIDER_SITE_OTHER): Payer: Commercial Managed Care - HMO | Admitting: Internal Medicine

## 2015-06-21 VITALS — BP 122/70 | HR 80 | Temp 98.8°F | Resp 18 | Ht 67.0 in | Wt 207.0 lb

## 2015-06-21 DIAGNOSIS — C3492 Malignant neoplasm of unspecified part of left bronchus or lung: Secondary | ICD-10-CM | POA: Diagnosis not present

## 2015-06-21 DIAGNOSIS — B370 Candidal stomatitis: Secondary | ICD-10-CM

## 2015-06-21 DIAGNOSIS — N400 Enlarged prostate without lower urinary tract symptoms: Secondary | ICD-10-CM | POA: Diagnosis not present

## 2015-06-21 DIAGNOSIS — E119 Type 2 diabetes mellitus without complications: Secondary | ICD-10-CM

## 2015-06-21 MED ORDER — FINASTERIDE 5 MG PO TABS: 5.0000 mg | ORAL_TABLET | Freq: Every day | ORAL | Status: DC

## 2015-06-21 MED ORDER — TAMSULOSIN HCL 0.4 MG PO CAPS: 0.4000 mg | ORAL_CAPSULE | Freq: Every day | ORAL | Status: DC

## 2015-06-21 NOTE — Progress Notes (Signed)
Subjective:    Patient ID: Ernest [name], male    DOB: 1956/10/14, 59 y.o.   MRN: [id number]  HPI  59 year old white male with history of lung cancer, COPD,Coronary artery disease and chronic pain for follow-up. Interval medical history-patient hospitalized for pneumonia. His workup for recurrent lung cancer was negative. Patient also recently seen in emergency room for neck pain. CT of cervical spine did not show any signs of metastatic disease.  Patient reports his respiratory status is at baseline. Patient's wife has noticed "puffy face" that may be related to prednisone use.  Patient's primary concern today is nocturia and sense of incomplete bladder emptying. He reports up to 4-5 episodes of nocturia per day. He has weak urinary stream and often has sensation of incomplete bladder emptying. He denies any dysuria.  Type 2 diabetes-patient does not check his blood sugar at home.  Hemoglobin A1c was checked during his last hospitalization -6.9.  Patient tolerating metformin.  We reviewed his current diet.   Review of Systems    Chronic shortness of breath unchanged. Negative for fever or chills Past Medical History  Diagnosis Date  . CAD (coronary artery disease)     Left Main 30% stenosis, LAD 20 - 30 % stenosis, first and second diagonal branchesat 40 - 50%  stenosis with small arteries, circumflex had 30% stenosis in the large obtuse marginal, RCA at 70 - 80%  stenosis [not felt to be occlusive after evaluation with flow wire], distal 50 - 60% stenosis - [name][  . COPD (chronic obstructive pulmonary disease)     Dr. Baird Lyons  . Depression   . Anxiety   . Hyperlipidemia   . Chronic insomnia   . Gout   . GERD (gastroesophageal reflux disease)   . PVD (peripheral vascular disease)     (ABI 0.9 on right and 0.89 on left)  severe left external iliac stenosis.  He  had successful stenting of his left external iliac per Dr. Burt Knack.  . DDD (degenerative disc  disease)   . Hx of colonoscopy   . 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 OSA (obstructive sleep apnea)     NPSG 09/10/10- AHI 11.3/hr  . Hypertension     dr Percival Spanish  . History of radiation therapy 11/10/13- 12/29/13    left lung 6600 cGy in 33 sessions  . Diabetes mellitus without complication 6/83/4196  . Cancer     lung  . Lung cancer 10/04/13    LUL squamous cell lung cancer  . History of cardiovascular stress test     Myoview 7/16:  Diaphragmatic attenuation, no ischemia, EF 56%; Low Risk    History   Social History  . Marital Status: Married    Spouse Name: N/A  . Number of Children: N/A  . Years of Education: N/A   Occupational History  . Disabled welder    Social History Main Topics  . Smoking status: Former Smoker -- 0.50 packs/day for 43 years    Types: Cigarettes    Quit date: 09/23/2013  . Smokeless tobacco: Never Used     Comment: history of 3 PPD, 11/02/13   . Alcohol Use: No  . Drug Use: No  . Sexual Activity: No   Other Topics Concern  . Not on file   Social History Narrative    Past Surgical History  Procedure Laterality Date  . Spinal fusion  03/05/2007    L4-L5  . Hip surgery      left 'bone  graft taken"  . Arm surgery      left elbow  . Shoulder surgery      right  . C-spine surgery    . Angioplasty    . Video bronchoscopy with endobronchial navigation N/A 10/04/2013    Procedure: VIDEO BRONCHOSCOPY WITH ENDOBRONCHIAL NAVIGATION;  Surgeon: Collene Gobble, MD;  Location: [facility];  Service: Thoracic;  Laterality: N/A;  . Back surgery      lower  . Anterior fusion cervical spine      cervical fusion  7 yrs ago ([facility])  . Colon surgery      '11 "Diverticulitis"  . Colonoscopy with propofol N/A 05/22/2015    Procedure: COLONOSCOPY WITH PROPOFOL;  Surgeon: Inda Castle, MD;  Location: [facility];  Service: Endoscopy;  Laterality: N/A;    Family History  Problem Relation Age of Onset  . Heart attack Mother   . Heart attack Sister   . Lung  cancer Sister   . Cancer Sister     small cell lung, mets to brain  . Emphysema Sister   . Hypertension Brother   . Stroke Neg Hx     Allergies  Allergen Reactions  . Carboplatin Shortness Of Breath, Swelling and Rash    Swelling of lips, rash on face,eyes and head    Current Outpatient Prescriptions on File Prior to Visit  Medication Sig Dispense Refill  . albuterol (PROVENTIL) (2.5 MG/3ML) 0.083% nebulizer solution INHALE 1 VIAL IN NEBULIZER EVERY 4 HOURS AS NEEDED FOR WHEEZING OR SHORTNESS OF BREATH 300 mL 11  . ALPRAZolam (XANAX) 0.25 MG tablet Take 0.25 mg by mouth daily as needed for anxiety.     . ARIPiprazole (ABILIFY) 5 MG tablet Take 1 tablet (5 mg total) by mouth at bedtime.    . Armodafinil (NUVIGIL) 150 MG tablet Take 150 mg by mouth every morning.     [name] aspirin 81 MG EC tablet TAKE 1 TABLET EVERY DAY 30 tablet 5  . atorvastatin (LIPITOR) 20 MG tablet TAKE 1 TABLET (20 MG TOTAL) BY MOUTH DAILY. (Patient taking differently: TAKE 1 TABLET (20 MG TOTAL) BY MOUTH NIGHTLY.) 90 tablet 3  . BD PEN NEEDLE NANO U/F 32G X 4 MM MISC 1 EACH BY DOES NOT APPLY ROUTE DAILY. 100 each 3  . clonazePAM (KLONOPIN) 1 MG tablet Take 2 mg by mouth at bedtime as needed for anxiety.     [name] esomeprazole (NEXIUM) 40 MG capsule Take 40 mg by mouth every morning.     [name] FLUoxetine (PROZAC) 40 MG capsule Take 40 mg by mouth every morning.    . Fluticasone-Salmeterol (ADVAIR DISKUS) 500-50 MCG/DOSE AEPB 1 puff then rinse mouth, twice daily maintenance 60 each 11  . Insulin Glargine (LANTUS SOLOSTAR) 100 UNIT/ML Solostar Pen 5 units at bedtime. 5 pen PRN  . isosorbide mononitrate (IMDUR) 60 MG 24 hr tablet Take 1 tablet (60 mg total) by mouth daily. 90 tablet 3  . levofloxacin (LEVAQUIN) 750 MG tablet Take 1 tablet (750 mg total) by mouth daily. 7 tablet 0  . methocarbamol (ROBAXIN) 500 MG tablet Take 500 mg by mouth 2 (two) times daily as needed for muscle spasms.     . metoprolol succinate (TOPROL-XL) 25  MG 24 hr tablet TAKE 1/2 TABLET BY MOUTH DAILY 30 tablet 5  . mirtazapine (REMERON) 45 MG tablet Take 1 tablet (45 mg total) by mouth at bedtime.    [name] morphine (MS CONTIN) 30 MG 12 hr tablet Take 1 tablet (  30 mg total) by mouth every 12 (twelve) hours.    . nitroGLYCERIN (NITROSTAT) 0.4 MG SL tablet Place 1 tablet (0.4 mg total) under the tongue every 5 (five) minutes as needed for chest pain. 25 tablet 3  . nystatin (MYCOSTATIN) 100000 UNIT/ML suspension Take 5 mLs (500,000 Units total) by mouth 4 (four) times daily. 60 mL 1  . prochlorperazine (COMPAZINE) 10 MG tablet TAKE 1 TABLET BY MOUTH EVERY 6 HOURS AS NEEDED FOR NAUSEA AND VOMITING 60 tablet 3  . theophylline (THEO-24) 400 MG 24 hr capsule Take 1 capsule (400 mg total) by mouth daily. 30 capsule 5  . Tiotropium Bromide-Olodaterol (STIOLTO RESPIMAT) 2.5-2.5 MCG/ACT AERS Inhale 2 puffs into the lungs daily. 1 Inhaler 5  . VENTOLIN HFA 108 (90 BASE) MCG/ACT inhaler INHALE 2 PUFFS INTO THE LUNGS 4 TIMES DAILY AS NEEDED FOR WHEEZING 18 Inhaler 11  . VOLTAREN 1 % GEL Apply 2 g topically daily as needed (for pain).     [name] escitalopram (LEXAPRO) 20 MG tablet Take 1 tablet (20 mg total) by mouth daily. (Patient not taking: Reported on 05/26/2015) 90 tablet 0  . fluticasone (FLONASE) 50 MCG/ACT nasal spray Place 2 sprays into both nostrils daily. (Patient taking differently: Place 2 sprays into both nostrils daily as needed for allergies or rhinitis. ) 16 g 3  . metFORMIN (GLUCOPHAGE-XR) 500 MG 24 hr tablet Take 2 tablets (1,000 mg total) by mouth daily with breakfast. (Patient not taking: Reported on 06/21/2015) 180 tablet 1  . morphine (MSIR) 15 MG tablet Take 1 tablet (15 mg total) by mouth every 4 (four) hours as needed for severe pain. (Patient not taking: Reported on 06/21/2015) 30 tablet 0  . polyethylene glycol powder (GLYCOLAX/MIRALAX) powder Take as directed for colonoscopy. (Patient not taking: Reported on 05/26/2015) 255 g 0  . potassium chloride  SA (K-DUR,KLOR-CON) 20 MEQ tablet Take 20 mEq by mouth every morning.     . predniSONE (DELTASONE) 10 MG tablet Take 4 tablets twice daily for 3 days, then take 4 tablets once daily for 4 days, then take 2 tablets once daily for 4 days, then take 1 tablets once daily as before. (Patient not taking: Reported on 05/26/2015) 60 tablet 0   Current Facility-Administered Medications on File Prior to Visit  Medication Dose Route Frequency Provider Last Rate Last Dose  . DOBUTamine (DOBUTREX) 1,000 mcg/mL in dextrose 5% 250 mL infusion  0-40 mcg/kg/min Intravenous Continuous Fay Records, MD 224.2 mL/hr at 06/12/15 1117 40 mcg/kg/min at 06/12/15 1117    BP 122/70 mmHg  Pulse 80  Temp(Src) 98.8 F (37.1 C) (Oral)  Resp 18  Ht '5\' 7"'$  (1.702 m)  Wt 207 lb (93.895 kg)  BMI 32.41 kg/m2  SpO2 92%    Objective:   Physical Exam  Constitutional: He is oriented to person, place, and time. He appears well-developed and well-nourished. No distress.  HENT:  Head: Normocephalic and atraumatic.  Slight moon face White film on tongue  Neck: Normal range of motion. Neck supple.  Cardiovascular: Normal rate, regular rhythm and normal heart sounds.   No murmur heard. Pulmonary/Chest:  Prolonged expiration, faint scattered end expiratory wheezes  Genitourinary: Rectum normal. Guaiac negative stool.  Slightly enlarged prostate, no asymmetry or nodules  Neurological: He is alert and oriented to person, place, and time. No cranial nerve deficit.  Psychiatric: He has a normal mood and affect. His behavior is normal.          Assessment & Plan:  1.  Symptomatic BPH 2.  Lung cancer 3.  Chronic COPD 4.  Diabetes mellitus type 2 5.  Oral thrush  Plan:  Patient experiencing symptomatic BPH. We discussed potential side effects of using Flomax. Patient's wife will help him monitor for side effect of hypotension.  Patient has follow-up with his oncologist-Dr. Earlie Server  Continue maintenance  inhalers  Continue metformin. I stressed importance of following carb modified diet. Patient also urged to avoid sweets and sugary beverages.  Use oral nystatin suspension.  He already has prescription at home.  Follow up in 2 months

## 2015-06-21 NOTE — Patient Instructions (Signed)
Decrease your intake of sweets, sugary beverages, and starchy foods

## 2015-06-24 DIAGNOSIS — J9601 Acute respiratory failure with hypoxia: Secondary | ICD-10-CM | POA: Diagnosis not present

## 2015-06-24 DIAGNOSIS — C342 Malignant neoplasm of middle lobe, bronchus or lung: Secondary | ICD-10-CM | POA: Diagnosis not present

## 2015-06-24 DIAGNOSIS — J449 Chronic obstructive pulmonary disease, unspecified: Secondary | ICD-10-CM | POA: Diagnosis not present

## 2015-06-25 DIAGNOSIS — C3490 Malignant neoplasm of unspecified part of unspecified bronchus or lung: Secondary | ICD-10-CM | POA: Diagnosis not present

## 2015-06-25 DIAGNOSIS — J449 Chronic obstructive pulmonary disease, unspecified: Secondary | ICD-10-CM | POA: Diagnosis not present

## 2015-07-04 DIAGNOSIS — J449 Chronic obstructive pulmonary disease, unspecified: Secondary | ICD-10-CM | POA: Diagnosis not present

## 2015-07-06 ENCOUNTER — Other Ambulatory Visit: Payer: Self-pay | Admitting: Internal Medicine

## 2015-07-14 ENCOUNTER — Encounter: Payer: Self-pay | Admitting: Adult Health

## 2015-07-14 ENCOUNTER — Ambulatory Visit (INDEPENDENT_AMBULATORY_CARE_PROVIDER_SITE_OTHER): Payer: Commercial Managed Care - HMO | Admitting: Adult Health

## 2015-07-14 ENCOUNTER — Ambulatory Visit (INDEPENDENT_AMBULATORY_CARE_PROVIDER_SITE_OTHER)
Admission: RE | Admit: 2015-07-14 | Discharge: 2015-07-14 | Disposition: A | Discharge status: Home / Self Care | Payer: Commercial Managed Care - HMO | Source: Ambulatory Visit | Attending: Adult Health | Admitting: Adult Health

## 2015-07-14 ENCOUNTER — Telehealth: Payer: Self-pay | Admitting: Internal Medicine

## 2015-07-14 VITALS — BP 132/70 | HR 74 | Temp 98.6°F | Ht 66.0 in | Wt 206.0 lb

## 2015-07-14 DIAGNOSIS — J441 Chronic obstructive pulmonary disease with (acute) exacerbation: Secondary | ICD-10-CM | POA: Diagnosis not present

## 2015-07-14 DIAGNOSIS — J9611 Chronic respiratory failure with hypoxia: Secondary | ICD-10-CM

## 2015-07-14 DIAGNOSIS — J9612 Chronic respiratory failure with hypercapnia: Secondary | ICD-10-CM | POA: Diagnosis not present

## 2015-07-14 DIAGNOSIS — R05 Cough: Secondary | ICD-10-CM | POA: Diagnosis not present

## 2015-07-14 DIAGNOSIS — G4733 Obstructive sleep apnea (adult) (pediatric): Secondary | ICD-10-CM

## 2015-07-14 DIAGNOSIS — C3492 Malignant neoplasm of unspecified part of left bronchus or lung: Secondary | ICD-10-CM

## 2015-07-14 DIAGNOSIS — R0602 Shortness of breath: Secondary | ICD-10-CM | POA: Diagnosis not present

## 2015-07-14 MED ORDER — METHYLPREDNISOLONE ACETATE 80 MG/ML IJ SUSP
120.0000 mg | Freq: Once | INTRAMUSCULAR | Status: AC
  Administered 2015-07-14: 120 mg via INTRAMUSCULAR

## 2015-07-14 MED ORDER — PREDNISONE 10 MG PO TABS: ORAL_TABLET | ORAL | Status: DC

## 2015-07-14 MED ORDER — AMOXICILLIN-POT CLAVULANATE 875-125 MG PO TABS: 1.0000 | ORAL_TABLET | Freq: Two times a day (BID) | ORAL | Status: AC

## 2015-07-14 NOTE — Telephone Encounter (Signed)
Patient wife aware of rec's per [facility] Order placed for switch to BiPAP machine.  Nothing further needed.

## 2015-07-14 NOTE — Assessment & Plan Note (Signed)
Cont on O2 .  

## 2015-07-14 NOTE — Telephone Encounter (Signed)
Patient having trouble breathing.  Struggling with cold symptoms. Patient scheduled to see TP today for Acute issues at 4pm.  -------------------  Wife also wants to know if they can switch patient to BiPAP.  She says that he is currently on a CPAP and he feels like he is smothering on the CPAP, she said that when patient was in the hospital, they recommended he use a BiPAP.  She wants to know what Dr. Annamaria Boots thinks is best.  To Dr. Annamaria Boots

## 2015-07-14 NOTE — Assessment & Plan Note (Addendum)
Flare  Depo medrol '120mg'$  IM   Plan Augmentin '875mg'$  Twice daily   Mucinex DM twice daily as needed for cough and congestion Prednisone taper over the next week Fluids and rest Follow up with Dr. Annamaria Boots as planned and as needed Chest x-ray today Please contact office for sooner follow up if symptoms do not improve or worsen or seek emergency care

## 2015-07-14 NOTE — Progress Notes (Signed)
   Subjective:    Patient ID: Ernest [name], male    DOB: 1956/06/22, 59 y.o.   MRN: [id number]  HPI 59 yo former smoker with COPD on O2 and OSA -CPAP intolerant   07/14/2015 Acute OV  Pt presents for an acute office visit.  Complains of  head and chest congestion, PND, sore throat and increased SOB. Prod cough with thick white mucus since 06/21/15. Denies any fever, nausea or vomiting.  On Advair , did not like stiolto   Taking mucinex without much help.  Denies chest pain, orthopnea, edema or fever.    Review of Systems  Constitutional:   No  weight loss, night sweats,  Fevers, chills,  +fatigue, or  lassitude.  HEENT:   No headaches,  Difficulty swallowing,  Tooth/dental problems, or  Sore throat,                No sneezing, itching, ear ache,  +nasal congestion, post nasal drip,   CV:  No chest pain,  Orthopnea, PND, swelling in lower extremities, anasarca, dizziness, palpitations, syncope.   GI  No heartburn, indigestion, abdominal pain, nausea, vomiting, diarrhea, change in bowel habits, loss of appetite, bloody stools.   Resp:  .  No chest wall deformity  Skin: no rash or lesions.  GU: no dysuria, change in color of urine, no urgency or frequency.  No flank pain, no hematuria   MS:  No joint pain or swelling.  No decreased range of motion.  No back pain.  Psych:  No change in mood or affect. No depression or anxiety.  No memory loss.         Objective:   Physical Exam   GEN: A/Ox3; pleasant , NAD, chronically ill appearing   HEENT:  [place]/AT,  EACs-clear, TMs-wnl, NOSE-clear, THROAT-clear, no lesions, no postnasal drip or exudate noted.   NECK:  Supple w/ fair ROM; no JVD; normal carotid impulses w/o bruits; no thyromegaly or nodules palpated; no lymphadenopathy.  RESP  Few exp wheezing .no accessory muscle use, no dullness to percussion  CARD:  RRR, no m/r/g  , no peripheral edema, pulses intact, no cyanosis or clubbing.  GI:   Soft & nt; nml bowel  sounds; no organomegaly or masses detected.  Musco: Warm bil, no deformities or joint swelling noted.   Neuro: alert, no focal deficits noted.    Skin: Warm, no lesions or rashes       Assessment & Plan:

## 2015-07-14 NOTE — Addendum Note (Signed)
Addended by: Inge Rise on: 07/14/2015 04:28 PM   Modules accepted: Orders

## 2015-07-14 NOTE — Addendum Note (Signed)
Addended by: Osa Craver on: 07/14/2015 04:42 PM   Modules accepted: Orders, Medications

## 2015-07-14 NOTE — Patient Instructions (Addendum)
Augmentin '875mg'$  Twice daily   Mucinex DM twice daily as needed for cough and congestion Prednisone taper over the next week Fluids and rest Follow up with Dr. Annamaria Boots in 2 month and as needed Chest x-ray today Please contact office for sooner follow up if symptoms do not improve or worsen or seek emergency care

## 2015-07-14 NOTE — Telephone Encounter (Signed)
He has been trying to use CPAP autotitration/ [employer], but is not tolerating CPAP pressures.  Please see if we can order DME [employer], replace CPAP with BIPAP auto Inspiratory 5-15   Expiratory 5-15 , continue O2 3L/min   Add AirView if available.    Dx OSA, Chronic respiratory failure with hypoxia, lung cancer  Hopefully [employer] can make this change without needing new sleep study.

## 2015-07-14 NOTE — Addendum Note (Signed)
Addended by: Osa Craver on: 07/14/2015 04:26 PM   Modules accepted: Orders

## 2015-07-17 NOTE — Progress Notes (Signed)
Quick Note:  LVM for pt to return call ______ 

## 2015-07-18 ENCOUNTER — Telehealth: Payer: Self-pay | Admitting: Internal Medicine

## 2015-07-18 ENCOUNTER — Encounter: Payer: Self-pay | Admitting: Cardiology

## 2015-07-18 ENCOUNTER — Ambulatory Visit (INDEPENDENT_AMBULATORY_CARE_PROVIDER_SITE_OTHER): Payer: Commercial Managed Care - HMO | Admitting: Cardiology

## 2015-07-18 VITALS — BP 138/68 | HR 81 | Ht 66.0 in | Wt 208.1 lb

## 2015-07-18 DIAGNOSIS — I2510 Atherosclerotic heart disease of native coronary artery without angina pectoris: Secondary | ICD-10-CM | POA: Diagnosis not present

## 2015-07-18 DIAGNOSIS — I10 Essential (primary) hypertension: Secondary | ICD-10-CM

## 2015-07-18 DIAGNOSIS — I739 Peripheral vascular disease, unspecified: Secondary | ICD-10-CM | POA: Diagnosis not present

## 2015-07-18 NOTE — Progress Notes (Signed)
Quick Note:  Called and spoke to pt. Reviewed results and recs. Pt voiced understanding and had no further questions. ______

## 2015-07-18 NOTE — Telephone Encounter (Signed)
Please call pt back at [phone] Pt gave wrong info when he called earlier.

## 2015-07-18 NOTE — Telephone Encounter (Signed)
Pt already aware of results.  States nothing further needed.   Notes Recorded by Melvenia Needles, NP on 07/17/2015 at 8:53 AM No sign of pNA  Cont w/ ov recs Please contact office for sooner follow up if symptoms do not improve or worsen or seek emergency care

## 2015-07-18 NOTE — Progress Notes (Signed)
HPI The patient presents for follow of known coronary disease.  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  He was hospitalized with respiratory failure earlier this year. He required ventilatory support. He did have an elevated troponin as part of this. He came back to see [place] in follow-up and had a stress perfusion study June. I have reviewed these records. His stress test was negative for any evidence of ischemia. He returns for follow-up. His biggest complaint today is leg pain. This started around June. He started having pain in his calf muscle with walking. This has progressed to discomfort walking less than 50 yards. It is reminiscent of his previous claudication. It is severe and limiting. He's not having any resting pain however. He has had some shortness of breath recently has been started back on some anti-biotics and steroid-induced.  He is not having any chest pressure, neck or arm discomfort. He chronically sleeps with his head elevated but he's not having any new PND or orthopnea. He has had no weight gain or edema. His cancer is under surveillance and has not progressed.   Allergies  Allergen Reactions  . Carboplatin Shortness Of Breath, Swelling and Rash    Swelling of lips, rash on face,eyes and head    Current Outpatient Prescriptions  Medication Sig Dispense Refill  . albuterol (PROVENTIL) (2.5 MG/3ML) 0.083% nebulizer solution INHALE 1 VIAL IN NEBULIZER EVERY 4 HOURS AS NEEDED FOR WHEEZING OR SHORTNESS OF BREATH 300 mL 11  . ALPRAZolam (XANAX) 0.25 MG tablet Take 0.25 mg by mouth daily as needed for anxiety.     [name] amoxicillin-clavulanate (AUGMENTIN) 875-125 MG per tablet Take 1 tablet by mouth 2 (two) times daily. 14 tablet 0  . ARIPiprazole (ABILIFY) 5 MG tablet Take 1 tablet (5 mg total) by mouth at bedtime.    . Armodafinil (NUVIGIL) 150 MG tablet Take 150 mg by mouth every morning.     [name]  aspirin 81 MG EC tablet TAKE 1 TABLET EVERY DAY 30 tablet 5  . atorvastatin (LIPITOR) 20 MG tablet TAKE 1 TABLET (20 MG TOTAL) BY MOUTH DAILY. (Patient taking differently: TAKE 1 TABLET (20 MG TOTAL) BY MOUTH NIGHTLY.) 90 tablet 3  . BD PEN NEEDLE NANO U/F 32G X 4 MM MISC 1 EACH BY DOES NOT APPLY ROUTE DAILY. 100 each 3  . clonazePAM (KLONOPIN) 1 MG tablet Take 2 mg by mouth at bedtime as needed for anxiety.     [name] escitalopram (LEXAPRO) 20 MG tablet Take 1 tablet (20 mg total) by mouth daily. 90 tablet 0  . esomeprazole (NEXIUM) 40 MG capsule Take 40 mg by mouth every morning.     . finasteride (PROSCAR) 5 MG tablet Take 1 tablet (5 mg total) by mouth daily. 30 tablet 3  . FLUoxetine (PROZAC) 40 MG capsule Take 40 mg by mouth every morning.    . gabapentin (NEURONTIN) 100 MG capsule Take 1 capsule by mouth daily. Take 1 cap daily    . Insulin Glargine (LANTUS SOLOSTAR) 100 UNIT/ML Solostar Pen 5 units at bedtime. 5 pen PRN  . isosorbide mononitrate (IMDUR) 60 MG 24 hr tablet Take 1 tablet (60 mg total) by mouth daily. 90 tablet 3  . metFORMIN (GLUCOPHAGE-XR) 500 MG 24 hr tablet Take 2 tablets (1,000 mg total) by mouth daily with breakfast. 180 tablet 1  . methocarbamol (ROBAXIN) 500 MG tablet Take 500 mg by mouth  2 (two) times daily as needed for muscle spasms.     . metoprolol succinate (TOPROL-XL) 25 MG 24 hr tablet TAKE 1/2 TABLET BY MOUTH DAILY 30 tablet 5  . mirtazapine (REMERON) 45 MG tablet Take 1 tablet (45 mg total) by mouth at bedtime.    [name] morphine (MS CONTIN) 30 MG 12 hr tablet Take 1 tablet (30 mg total) by mouth every 12 (twelve) hours.    [name] morphine (MSIR) 15 MG tablet Take 1 tablet (15 mg total) by mouth every 4 (four) hours as needed for severe pain. 30 tablet 0  . nitroGLYCERIN (NITROSTAT) 0.4 MG SL tablet Place 1 tablet (0.4 mg total) under the tongue every 5 (five) minutes as needed for chest pain. 25 tablet 3  . nystatin (MYCOSTATIN) 100000 UNIT/ML suspension Take 5 mLs  (500,000 Units total) by mouth 4 (four) times daily. 60 mL 1  . polyethylene glycol powder (GLYCOLAX/MIRALAX) powder Take as directed for colonoscopy. 255 g 0  . potassium chloride SA (K-DUR,KLOR-CON) 20 MEQ tablet Take 20 mEq by mouth every morning.     . predniSONE (DELTASONE) 10 MG tablet Take 4 tablets twice daily for 3 days, then take 4 tablets once daily for 4 days, then take 2 tablets once daily for 4 days, then take 1 tablets once daily as before. 60 tablet 0  . predniSONE (DELTASONE) 10 MG tablet 4 tabs for 2 days, then 3 tabs for 2 days, 2 tabs for 2 days, then 1 tab for 2 days, then stop 20 tablet 0  . prochlorperazine (COMPAZINE) 10 MG tablet TAKE 1 TABLET BY MOUTH EVERY 6 HOURS AS NEEDED FOR NAUSEA AND VOMITING 60 tablet 3  . tamsulosin (FLOMAX) 0.4 MG CAPS capsule Take 1 capsule (0.4 mg total) by mouth daily. 30 capsule 3  . theophylline (THEO-24) 400 MG 24 hr capsule Take 1 capsule (400 mg total) by mouth daily. 30 capsule 5  . VENTOLIN HFA 108 (90 BASE) MCG/ACT inhaler INHALE 2 PUFFS INTO THE LUNGS 4 TIMES DAILY AS NEEDED FOR WHEEZING 18 Inhaler 11  . VOLTAREN 1 % GEL Apply 2 g topically daily as needed (for pain).     . fluticasone (FLONASE) 50 MCG/ACT nasal spray Place 2 sprays into both nostrils daily. (Patient taking differently: Place 2 sprays into both nostrils daily as needed for allergies or rhinitis. ) 16 g 3  . Fluticasone-Salmeterol (ADVAIR DISKUS) 500-50 MCG/DOSE AEPB 1 puff then rinse mouth, twice daily maintenance 60 each 11   No current facility-administered medications for this visit.   Facility-Administered Medications Ordered in Other Visits  Medication Dose Route Frequency Provider Last Rate Last Dose  . DOBUTamine (DOBUTREX) 1,000 mcg/mL in dextrose 5% 250 mL infusion  0-40 mcg/kg/min Intravenous Continuous Fay Records, MD 224.2 mL/hr at 06/12/15 1117 40 mcg/kg/min at 06/12/15 1117    Past Medical History  Diagnosis Date  . CAD (coronary artery disease)      Left Main 30% stenosis, LAD 20 - 30 % stenosis, first and second diagonal branchesat 40 - 50%  stenosis with small arteries, circumflex had 30% stenosis in the large obtuse marginal, RCA at 70 - 80%  stenosis [not felt to be occlusive after evaluation with flow wire], distal 50 - 60% stenosis - Jastin Fore[  . COPD (chronic obstructive pulmonary disease)     Dr. Baird Lyons  . Depression   . Anxiety   . Hyperlipidemia   . Chronic insomnia   . Gout   . GERD (  gastroesophageal reflux disease)   . PVD (peripheral vascular disease)     (ABI 0.9 on right and 0.89 on left)  severe left external iliac stenosis.  He  had successful stenting of his left external iliac per Dr. Burt Knack.  . DDD (degenerative disc disease)   . Hx of colonoscopy   . 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 OSA (obstructive sleep apnea)     NPSG 09/10/10- AHI 11.3/hr  . Hypertension     dr Percival Spanish  . History of radiation therapy 11/10/13- 12/29/13    left lung 6600 cGy in 33 sessions  . Diabetes mellitus without complication 2/87/8676  . Cancer     lung  . Lung cancer 10/04/13    LUL squamous cell lung cancer  . History of cardiovascular stress test     Myoview 7/16:  Diaphragmatic attenuation, no ischemia, EF 56%; Low Risk    Past Surgical History  Procedure Laterality Date  . Spinal fusion  03/05/2007    L4-L5  . Hip surgery      left 'bone graft taken"  . Arm surgery      left elbow  . Shoulder surgery      right  . C-spine surgery    . Angioplasty    . Video bronchoscopy with endobronchial navigation N/A 10/04/2013    Procedure: VIDEO BRONCHOSCOPY WITH ENDOBRONCHIAL NAVIGATION;  Surgeon: Collene Gobble, MD;  Location: [facility];  Service: Thoracic;  Laterality: N/A;  . Back surgery      lower  . Anterior fusion cervical spine      cervical fusion  7 yrs ago ([facility])  . Colon surgery      '11 "Diverticulitis"  . Colonoscopy with propofol N/A 05/22/2015    Procedure: COLONOSCOPY WITH PROPOFOL;  Surgeon: Inda Castle, MD;  Location: [facility];  Service: Endoscopy;  Laterality: N/A;    ROS:    As stated in the HPI and negative for all other systems.  PHYSICAL EXAM BP 138/68 mmHg  Pulse 81  Ht '5\' 6"'$  (1.676 m)  Wt 208 lb 1 oz (94.377 kg)  BMI 33.60 kg/m2 GENERAL:  Well appearing HEENT:  Pupils equal round and reactive, fundi not visualized, oral mucosa unremarkable, dentures NECK:  No jugular venous distention, waveform within normal limits, carotid upstroke brisk and symmetric, no bruits, no thyromegaly LYMPHATICS:  No cervical, inguinal adenopathy LUNGS:  Decreased breath sounds bilaterally with expiratory wheezing BACK:  No CVA tenderness CHEST:  Unremarkable HEART:  PMI not displaced or sustained,S1 and S2 within normal limits, no S3, no S4, no clicks, no rubs, no murmurs ABD:  Flat, positive bowel sounds normal in frequency in pitch, no bruits, no rebound, no guarding, no midline pulsatile mass, no hepatomegaly, no splenomegaly EXT:  2 plus pulses upper, DP/PT left 2 plus, absent DP/PT right, no edema, no cyanosis no clubbing SKIN:  No rashes no nodules    ASSESSMENT AND PLAN  CLAUDICATION - He has no recurrent claudication.  I will set him up for ABIs and he will be seeing Dr. Gwenlyn Found next week.  CORONARY ATHEROSCLEROSIS NATIVE CORONARY ARTERY -  The patient has no new symptoms. No change in therapy is indicated.  HYPERTENSION -  His blood pressure is controlled and he will continue the meds as listed.  HYPERCHOLESTEROLEMIA -  His last lipid level included elevated triglycerides and so his calculated LDL is inadequate. He will need repeat fasting levels in the future. He is not fasting today.

## 2015-07-18 NOTE — Patient Instructions (Addendum)
Your physician recommends that you schedule a follow-up appointment PV follow-up with Dr Gwenlyn Found as soon as possible  Your physician has requested that you have a lower or upper extremity arterial duplex. This test is an ultrasound of the arteries in the legs or arms. It looks at arterial blood flow in the legs and arms. Allow one hour for Lower and Upper Arterial scans. There are no restrictions or special instructions

## 2015-07-19 ENCOUNTER — Other Ambulatory Visit: Payer: Self-pay | Admitting: Internal Medicine

## 2015-07-19 ENCOUNTER — Telehealth: Payer: Self-pay | Admitting: Internal Medicine

## 2015-07-19 DIAGNOSIS — J449 Chronic obstructive pulmonary disease, unspecified: Secondary | ICD-10-CM | POA: Diagnosis not present

## 2015-07-19 DIAGNOSIS — J9612 Chronic respiratory failure with hypercapnia: Secondary | ICD-10-CM

## 2015-07-19 NOTE — Assessment & Plan Note (Signed)
Documented hypercapneic respiratory failure. Patient will need to replace BIPAP, which has been making him feel smothered, to an ASV device: Trilogy, through his DME.

## 2015-07-19 NOTE — Assessment & Plan Note (Signed)
Hypercapneic and hypoxic respiratory failure documented 04/11/15. Considering this, with both OSA and severe COPD and lung cancer, he will need an ASV device for ventilatory control. His BIPAP has been insufficient and we have been having trouble adjusting airflow pattern to his needs and tolerance.

## 2015-07-19 NOTE — Telephone Encounter (Signed)
Spoke with Jeneen Rinks at [facility].  Would Dr Annamaria Boots be ok with pt using the Trilogy instead of a traditional bipap?  If wanting bipap pt would most likely have to have another sleep study.  With the trilogy pt would qualify with recent PCO2 result from hospital 04/11/15.  If Trilogy is ok ov note from 07/14/15 would need an addendum stating that due to PCO2 reading pt needs to change to Triliogy.  Please advise.

## 2015-07-20 ENCOUNTER — Other Ambulatory Visit: Payer: Self-pay | Admitting: Cardiology

## 2015-07-20 ENCOUNTER — Ambulatory Visit: Payer: Commercial Managed Care - HMO | Admitting: Internal Medicine

## 2015-07-20 DIAGNOSIS — I739 Peripheral vascular disease, unspecified: Secondary | ICD-10-CM

## 2015-07-20 NOTE — Telephone Encounter (Signed)
Called spoke with Ernest [name]. Made aware pt has not had PFT done this year. He reports he will await the signed order from Dr. Annamaria Boots Per former note, the form was left with elise to give Dr. Annamaria Boots. Please advise thanks

## 2015-07-20 NOTE — Telephone Encounter (Signed)
[employer] back.  Pt will need pft done within this last Year.

## 2015-07-20 NOTE — Telephone Encounter (Signed)
Form given to Dr. Annamaria Boots along with a the latest PFT (done in 2009).

## 2015-07-20 NOTE — Telephone Encounter (Signed)
Ernest [name] with [employer] left form for CY to sign. Given to elise

## 2015-07-21 ENCOUNTER — Encounter (HOSPITAL_COMMUNITY): Payer: Commercial Managed Care - HMO

## 2015-07-21 NOTE — Telephone Encounter (Signed)
Will forward to [facility] to follow.

## 2015-07-21 NOTE — Telephone Encounter (Signed)
Ernest [name] with [employer] came by the office this morning and picked up signed papers with PFT results. Nothing more needed at this time.

## 2015-07-24 ENCOUNTER — Telehealth: Payer: Self-pay | Admitting: Family Medicine

## 2015-07-24 ENCOUNTER — Ambulatory Visit: Payer: Commercial Managed Care - HMO | Admitting: Family Medicine

## 2015-07-24 DIAGNOSIS — Z0289 Encounter for other administrative examinations: Secondary | ICD-10-CM

## 2015-07-24 NOTE — Telephone Encounter (Signed)
Pt was on schedule to see Dr. Sarajane Jews this morning. I called and left a voice message for pt to reschedule.

## 2015-07-25 DIAGNOSIS — J449 Chronic obstructive pulmonary disease, unspecified: Secondary | ICD-10-CM | POA: Diagnosis not present

## 2015-07-25 DIAGNOSIS — C342 Malignant neoplasm of middle lobe, bronchus or lung: Secondary | ICD-10-CM | POA: Diagnosis not present

## 2015-07-25 DIAGNOSIS — J9612 Chronic respiratory failure with hypercapnia: Secondary | ICD-10-CM | POA: Diagnosis not present

## 2015-07-25 DIAGNOSIS — J9601 Acute respiratory failure with hypoxia: Secondary | ICD-10-CM | POA: Diagnosis not present

## 2015-07-26 ENCOUNTER — Telehealth: Payer: Self-pay | Admitting: Adult Health

## 2015-07-26 DIAGNOSIS — C3490 Malignant neoplasm of unspecified part of unspecified bronchus or lung: Secondary | ICD-10-CM | POA: Diagnosis not present

## 2015-07-26 DIAGNOSIS — J449 Chronic obstructive pulmonary disease, unspecified: Secondary | ICD-10-CM | POA: Diagnosis not present

## 2015-07-26 DIAGNOSIS — F3341 Major depressive disorder, recurrent, in partial remission: Secondary | ICD-10-CM | POA: Diagnosis not present

## 2015-07-26 NOTE — Telephone Encounter (Signed)
Called home # and received message it is not in service. Called mobile # and LMTCB x1 on named VM

## 2015-07-27 ENCOUNTER — Other Ambulatory Visit: Payer: Self-pay | Admitting: Cardiology

## 2015-07-27 ENCOUNTER — Ambulatory Visit (HOSPITAL_COMMUNITY)
Admission: RE | Admit: 2015-07-27 | Discharge: 2015-07-27 | Disposition: A | Discharge status: Home / Self Care | Payer: Commercial Managed Care - HMO | Source: Ambulatory Visit | Attending: Cardiology | Admitting: Cardiology

## 2015-07-27 DIAGNOSIS — E785 Hyperlipidemia, unspecified: Secondary | ICD-10-CM | POA: Diagnosis not present

## 2015-07-27 DIAGNOSIS — E119 Type 2 diabetes mellitus without complications: Secondary | ICD-10-CM | POA: Diagnosis not present

## 2015-07-27 DIAGNOSIS — I739 Peripheral vascular disease, unspecified: Secondary | ICD-10-CM

## 2015-07-27 DIAGNOSIS — I10 Essential (primary) hypertension: Secondary | ICD-10-CM | POA: Diagnosis not present

## 2015-07-27 DIAGNOSIS — F17200 Nicotine dependence, unspecified, uncomplicated: Secondary | ICD-10-CM | POA: Diagnosis not present

## 2015-07-27 DIAGNOSIS — I70202 Unspecified atherosclerosis of native arteries of extremities, left leg: Secondary | ICD-10-CM | POA: Diagnosis not present

## 2015-07-27 NOTE — Telephone Encounter (Signed)
atc home #, number not in service. lmtcb X2 on Mobile number.

## 2015-07-28 NOTE — Telephone Encounter (Signed)
Left detailed message on voicemail advising patient to call back if still needing assistance.

## 2015-07-29 ENCOUNTER — Other Ambulatory Visit: Payer: Self-pay | Admitting: Internal Medicine

## 2015-08-01 ENCOUNTER — Ambulatory Visit (INDEPENDENT_AMBULATORY_CARE_PROVIDER_SITE_OTHER): Payer: Commercial Managed Care - HMO | Admitting: Cardiovascular Disease

## 2015-08-01 ENCOUNTER — Encounter: Payer: Self-pay | Admitting: Cardiovascular Disease

## 2015-08-01 VITALS — BP 140/80 | HR 96 | Ht 66.0 in | Wt 205.0 lb

## 2015-08-01 DIAGNOSIS — Z79899 Other long term (current) drug therapy: Secondary | ICD-10-CM | POA: Diagnosis not present

## 2015-08-01 DIAGNOSIS — R5383 Other fatigue: Secondary | ICD-10-CM | POA: Diagnosis not present

## 2015-08-01 DIAGNOSIS — R0989 Other specified symptoms and signs involving the circulatory and respiratory systems: Secondary | ICD-10-CM

## 2015-08-01 DIAGNOSIS — I739 Peripheral vascular disease, unspecified: Secondary | ICD-10-CM

## 2015-08-01 DIAGNOSIS — D689 Coagulation defect, unspecified: Secondary | ICD-10-CM | POA: Diagnosis not present

## 2015-08-01 NOTE — Progress Notes (Signed)
08/01/2015 Ernest [name]   09/16/1956  [id number]  Primary Physician Ernest Pry, DO Primary Cardiologist: Ernest Harp MD Renae Gloss   HPI:  Ernest [name] is a 59 year old moderately overweight married Caucasian male children (one deceased child) patient of Dr. Rosezella [name] and Dr. Lora [name] referred for peripheral vascular evaluation because of claudication and worsening Doppler. 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 His cardiovascular risk factor profile is notable for greater than 100 pack years of tobacco abuse having stopped last year. 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 His risk factors otherwise include a family history of heart disease with both parents who had myocardial infarctions. He has treated hypertension, diabetes and hyperlipidemia. He has had stenting of his left external iliac artery by Dr. Burt Knack in 2009. Over the last 2 months he's had worsening right lower extremity claudication with recent Dopplers performed 9/120/16 revealing a right ABI 0.36 with an occluded right iliac and a left ABI of 0.9.   Current Outpatient Prescriptions  Medication Sig Dispense Refill  . albuterol (PROVENTIL) (2.5 MG/3ML) 0.083% nebulizer solution INHALE 1 VIAL IN NEBULIZER EVERY 4 HOURS AS NEEDED FOR WHEEZING OR SHORTNESS OF BREATH 300 mL 11  . ALPRAZolam (XANAX) 0.25 MG tablet Take 0.25 mg by mouth daily as needed for anxiety.     . ARIPiprazole (ABILIFY) 5 MG tablet Take 1 tablet (5 mg total) by mouth at bedtime.    . Armodafinil (NUVIGIL) 150 MG tablet Take 150 mg by mouth every morning.     [name] aspirin 81 MG EC tablet TAKE 1 TABLET EVERY DAY 30 tablet 5  . atorvastatin (LIPITOR) 20 MG tablet TAKE 1 TABLET EVERY DAY 90 tablet 3  . BD PEN NEEDLE NANO U/F 32G X 4 MM MISC 1 EACH BY DOES NOT APPLY ROUTE DAILY. 100 each 3  . clonazePAM (KLONOPIN) 1 MG tablet Take 2 mg by mouth at bedtime as needed for  anxiety.     [name] escitalopram (LEXAPRO) 20 MG tablet Take 1 tablet (20 mg total) by mouth daily. 90 tablet 0  . esomeprazole (NEXIUM) 40 MG capsule Take 40 mg by mouth every morning.     . finasteride (PROSCAR) 5 MG tablet Take 1 tablet (5 mg total) by mouth daily. 30 tablet 3  . FLUoxetine (PROZAC) 40 MG capsule Take 40 mg by mouth every morning.    . Fluticasone-Salmeterol (ADVAIR DISKUS) 500-50 MCG/DOSE AEPB 1 puff then rinse mouth, twice daily maintenance 60 each 11  . gabapentin (NEURONTIN) 100 MG capsule Take 1 capsule by mouth daily. Take 1 cap daily    . Insulin Glargine (LANTUS SOLOSTAR) 100 UNIT/ML Solostar Pen 5 units at bedtime. 5 pen PRN  . isosorbide mononitrate (IMDUR) 60 MG 24 hr tablet Take 1 tablet (60 mg total) by mouth daily. 90 tablet 3  . metFORMIN (GLUCOPHAGE-XR) 500 MG 24 hr tablet Take 2 tablets (1,000 mg total) by mouth daily with breakfast. 180 tablet 1  . methocarbamol (ROBAXIN) 500 MG tablet Take 500 mg by mouth 2 (two) times daily as needed for muscle spasms.     . metoprolol succinate (TOPROL-XL) 25 MG 24 hr tablet TAKE 1/2 TABLET BY MOUTH DAILY 30 tablet 3  . mirtazapine (REMERON) 45 MG tablet Take 1 tablet (45 mg total) by mouth at bedtime.    [name] morphine (MS CONTIN) 30 MG 12 hr tablet Take 1 tablet (30 mg total) by mouth  every 12 (twelve) hours.    [name] morphine (MSIR) 15 MG tablet Take 1 tablet (15 mg total) by mouth every 4 (four) hours as needed for severe pain. 30 tablet 0  . nitroGLYCERIN (NITROSTAT) 0.4 MG SL tablet Place 1 tablet (0.4 mg total) under the tongue every 5 (five) minutes as needed for chest pain. 25 tablet 3  . nystatin (MYCOSTATIN) 100000 UNIT/ML suspension Take 5 mLs (500,000 Units total) by mouth 4 (four) times daily. 60 mL 1  . polyethylene glycol powder (GLYCOLAX/MIRALAX) powder Take as directed for colonoscopy. 255 g 0  . potassium chloride SA (K-DUR,KLOR-CON) 20 MEQ tablet Take 20 mEq by mouth every morning.     . predniSONE (DELTASONE) 10 MG  tablet Take 4 tablets twice daily for 3 days, then take 4 tablets once daily for 4 days, then take 2 tablets once daily for 4 days, then take 1 tablets once daily as before. 60 tablet 0  . predniSONE (DELTASONE) 10 MG tablet 4 tabs for 2 days, then 3 tabs for 2 days, 2 tabs for 2 days, then 1 tab for 2 days, then stop 20 tablet 0  . prochlorperazine (COMPAZINE) 10 MG tablet TAKE 1 TABLET BY MOUTH EVERY 6 HOURS AS NEEDED FOR NAUSEA AND VOMITING 60 tablet 3  . tamsulosin (FLOMAX) 0.4 MG CAPS capsule Take 1 capsule (0.4 mg total) by mouth daily. 30 capsule 3  . theophylline (THEO-24) 400 MG 24 hr capsule Take 1 capsule (400 mg total) by mouth daily. 30 capsule 5  . VENTOLIN HFA 108 (90 BASE) MCG/ACT inhaler INHALE 2 PUFFS INTO THE LUNGS 4 TIMES DAILY AS NEEDED FOR WHEEZING 18 Inhaler 11  . VOLTAREN 1 % GEL Apply 2 g topically daily as needed (for pain).     . fluticasone (FLONASE) 50 MCG/ACT nasal spray Place 2 sprays into both nostrils daily. (Patient taking differently: Place 2 sprays into both nostrils daily as needed for allergies or rhinitis. ) 16 g 3   No current facility-administered medications for this visit.   Facility-Administered Medications Ordered in Other Visits  Medication Dose Route Frequency Provider Last Rate Last Dose  . DOBUTamine (DOBUTREX) 1,000 mcg/mL in dextrose 5% 250 mL infusion  0-40 mcg/kg/min Intravenous Continuous Fay Records, MD 224.2 mL/hr at 06/12/15 1117 40 mcg/kg/min at 06/12/15 1117    Allergies  Allergen Reactions  . Carboplatin Shortness Of Breath, Swelling and Rash    Swelling of lips, rash on face,eyes and head    Social History   Social History  . Marital Status: Married    Spouse Name: N/A  . Number of Children: N/A  . Years of Education: N/A   Occupational History  . Disabled welder    Social History Main Topics  . Smoking status: Former Smoker -- 0.50 packs/day for 43 years    Types: Cigarettes    Quit date: 09/23/2013  . Smokeless  tobacco: Never Used     Comment: history of 3 PPD, 11/02/13   . Alcohol Use: No  . Drug Use: No  . Sexual Activity: No   Other Topics Concern  . Not on file   Social History Narrative     Review of Systems: General: negative for chills, fever, night sweats or weight changes.  Cardiovascular: negative for chest pain, dyspnea on exertion, edema, orthopnea, palpitations, paroxysmal nocturnal dyspnea or shortness of breath Dermatological: negative for rash Respiratory: negative for cough or wheezing Urologic: negative for hematuria Abdominal: negative for nausea, vomiting, diarrhea, bright  red blood per rectum, melena, or hematemesis Neurologic: negative for visual changes, syncope, or dizziness All other systems reviewed and are otherwise negative except as noted above.    Blood pressure 140/80, pulse 96, height '5\' 6"'$  (1.676 m), weight 205 lb (92.987 kg).  General appearance: alert and no distress Neck: no adenopathy, no JVD, supple, symmetrical, trachea midline, thyroid not enlarged, symmetric, no tenderness/mass/nodules and soft right carotid bruit Lungs: clear to auscultation bilaterally Heart: regular rate and rhythm, S1, S2 normal, no murmur, click, rub or gallop Extremities: extremities normal, atraumatic, no cyanosis or edema Pulses: 2+ and symmetric absent right femoral pulse, 2+ left femoral pulse with bruit Skin: Skin color, texture, turgor normal. No rashes or lesions Neurologic: Grossly normal  EKG not performed today  ASSESSMENT AND PLAN:   Peripheral vascular disease History of PAD status post left external iliac artery stenting by Dr. Burt Knack in 2009. He has had right calf claudication for the last several months. Recent Dopplers performed 07/27/15 revealed a decline in his right ABI 2.36 with what appears to be occlusion of his right common iliac artery. His left external iliac artery appears patent with a left ABI 0.9. He is symptomatic. We will plan on performing  angiography and potential intervention.      Ernest Harp MD FACP,FACC,FAHA, [facility] 08/01/2015 2:59 PM

## 2015-08-01 NOTE — Patient Instructions (Addendum)
Dr. Gwenlyn Found has ordered a peripheral angiogram to be done at [facility].  This procedure is going to look at the bloodflow in your lower extremities.  If Dr. Gwenlyn Found is able to open up the arteries, you will have to spend one night in the hospital.  If he is not able to open the arteries, you will be able to go home that same day.    After the procedure, you will not be allowed to drive for 3 days or push, pull, or lift anything greater than 10 lbs for one week.    You will be required to have the following tests prior to the procedure:  1. Blood work-the blood work can be done no more than 7 days prior to the procedure.  It can be done at any [facility] lab.  There is one downstairs on the first floor of this building and one in the [facility] building [id number])   *[facility]  Carotid Duplex- This test is an ultrasound of the carotid arteries in your neck. It looks at blood flow through these arteries that supply the brain with blood. Allow one hour for this exam. There are no restrictions or special instructions.

## 2015-08-01 NOTE — Assessment & Plan Note (Signed)
History of PAD status post left external iliac artery stenting by Dr. Burt Knack in 2009. He has had right calf claudication for the last several months. Recent Dopplers performed 07/27/15 revealed a decline in his right ABI 2.36 with what appears to be occlusion of his right common iliac artery. His left external iliac artery appears patent with a left ABI 0.9. He is symptomatic. We will plan on performing angiography and potential intervention.

## 2015-08-01 NOTE — Telephone Encounter (Signed)
Left message for pt to call back  °

## 2015-08-02 ENCOUNTER — Encounter: Payer: Self-pay | Admitting: Cardiovascular Disease

## 2015-08-02 NOTE — Telephone Encounter (Signed)
Called and spoke to pt. Pt states he is doing a lot better since last week when he called. Pt states he will call back if things worsen. Nothing further needed at this time.

## 2015-08-04 DIAGNOSIS — J449 Chronic obstructive pulmonary disease, unspecified: Secondary | ICD-10-CM | POA: Diagnosis not present

## 2015-08-07 DIAGNOSIS — I739 Peripheral vascular disease, unspecified: Secondary | ICD-10-CM | POA: Diagnosis not present

## 2015-08-07 DIAGNOSIS — D689 Coagulation defect, unspecified: Secondary | ICD-10-CM | POA: Diagnosis not present

## 2015-08-07 DIAGNOSIS — Z79899 Other long term (current) drug therapy: Secondary | ICD-10-CM | POA: Diagnosis not present

## 2015-08-07 DIAGNOSIS — R5383 Other fatigue: Secondary | ICD-10-CM | POA: Diagnosis not present

## 2015-08-08 ENCOUNTER — Inpatient Hospital Stay (HOSPITAL_COMMUNITY): Admission: RE | Admit: 2015-08-08 | Payer: Commercial Managed Care - HMO | Source: Ambulatory Visit

## 2015-08-08 LAB — APTT: aPTT: 32 seconds (ref 24–37)

## 2015-08-08 LAB — CBC
HCT: 44.2 % (ref 39.0–52.0)
Hemoglobin: 14 g/dL (ref 13.0–17.0)
MCH: 28.8 pg (ref 26.0–34.0)
MCHC: 31.7 g/dL (ref 30.0–36.0)
MCV: 90.9 fL (ref 78.0–100.0)
MPV: 12.5 fL — ABNORMAL HIGH (ref 8.6–12.4)
PLATELETS: 113 10*3/uL — AB (ref 150–400)
RBC: 4.86 MIL/uL (ref 4.22–5.81)
RDW: 14.4 % (ref 11.5–15.5)
WBC: 10 10*3/uL (ref 4.0–10.5)

## 2015-08-08 LAB — BASIC METABOLIC PANEL
BUN: 8 mg/dL (ref 7–25)
CO2: 33 mmol/L — ABNORMAL HIGH (ref 20–31)
Calcium: 8.8 mg/dL (ref 8.6–10.3)
Chloride: 97 mmol/L — ABNORMAL LOW (ref 98–110)
Creat: 0.8 mg/dL (ref 0.70–1.25)
GLUCOSE: 173 mg/dL — AB (ref 65–99)
Potassium: 4.2 mmol/L (ref 3.5–5.3)
Sodium: 141 mmol/L (ref 135–146)

## 2015-08-08 LAB — PROTIME-INR
INR: 0.91 (ref <1.50)
Prothrombin Time: 12.4 seconds (ref 11.6–15.2)

## 2015-08-08 LAB — TSH: TSH: 2.353 u[IU]/mL (ref 0.350–4.500)

## 2015-08-09 ENCOUNTER — Ambulatory Visit: Payer: Commercial Managed Care - HMO | Admitting: Internal Medicine

## 2015-08-10 ENCOUNTER — Encounter (HOSPITAL_COMMUNITY)
Admission: RE | Disposition: A | Discharge status: Home / Self Care | Payer: Self-pay | Source: Ambulatory Visit | Attending: Cardiovascular Disease

## 2015-08-10 ENCOUNTER — Encounter (HOSPITAL_COMMUNITY): Payer: Self-pay | Admitting: Cardiovascular Disease

## 2015-08-10 ENCOUNTER — Ambulatory Visit (HOSPITAL_COMMUNITY)
Admission: RE | Admit: 2015-08-10 | Discharge: 2015-08-11 | Disposition: A | Discharge status: Home / Self Care | Payer: Commercial Managed Care - HMO | Source: Ambulatory Visit | Attending: Cardiovascular Disease | Admitting: Cardiovascular Disease

## 2015-08-10 DIAGNOSIS — Z9221 Personal history of antineoplastic chemotherapy: Secondary | ICD-10-CM | POA: Diagnosis not present

## 2015-08-10 DIAGNOSIS — D696 Thrombocytopenia, unspecified: Secondary | ICD-10-CM | POA: Diagnosis not present

## 2015-08-10 DIAGNOSIS — Z87891 Personal history of nicotine dependence: Secondary | ICD-10-CM | POA: Insufficient documentation

## 2015-08-10 DIAGNOSIS — I739 Peripheral vascular disease, unspecified: Secondary | ICD-10-CM | POA: Diagnosis present

## 2015-08-10 DIAGNOSIS — I7092 Chronic total occlusion of artery of the extremities: Secondary | ICD-10-CM | POA: Diagnosis not present

## 2015-08-10 DIAGNOSIS — I70211 Atherosclerosis of native arteries of extremities with intermittent claudication, right leg: Secondary | ICD-10-CM | POA: Diagnosis not present

## 2015-08-10 DIAGNOSIS — D689 Coagulation defect, unspecified: Secondary | ICD-10-CM

## 2015-08-10 DIAGNOSIS — I2510 Atherosclerotic heart disease of native coronary artery without angina pectoris: Secondary | ICD-10-CM | POA: Diagnosis not present

## 2015-08-10 DIAGNOSIS — I745 Embolism and thrombosis of iliac artery: Secondary | ICD-10-CM | POA: Diagnosis present

## 2015-08-10 DIAGNOSIS — Z23 Encounter for immunization: Secondary | ICD-10-CM | POA: Insufficient documentation

## 2015-08-10 DIAGNOSIS — R5383 Other fatigue: Secondary | ICD-10-CM

## 2015-08-10 DIAGNOSIS — G4733 Obstructive sleep apnea (adult) (pediatric): Secondary | ICD-10-CM | POA: Insufficient documentation

## 2015-08-10 DIAGNOSIS — Z79899 Other long term (current) drug therapy: Secondary | ICD-10-CM | POA: Diagnosis not present

## 2015-08-10 DIAGNOSIS — E785 Hyperlipidemia, unspecified: Secondary | ICD-10-CM | POA: Diagnosis not present

## 2015-08-10 DIAGNOSIS — I10 Essential (primary) hypertension: Secondary | ICD-10-CM | POA: Diagnosis not present

## 2015-08-10 DIAGNOSIS — Z794 Long term (current) use of insulin: Secondary | ICD-10-CM | POA: Insufficient documentation

## 2015-08-10 DIAGNOSIS — Z85118 Personal history of other malignant neoplasm of bronchus and lung: Secondary | ICD-10-CM | POA: Diagnosis not present

## 2015-08-10 DIAGNOSIS — J449 Chronic obstructive pulmonary disease, unspecified: Secondary | ICD-10-CM | POA: Diagnosis not present

## 2015-08-10 DIAGNOSIS — R0989 Other specified symptoms and signs involving the circulatory and respiratory systems: Secondary | ICD-10-CM

## 2015-08-10 HISTORY — PX: PERIPHERAL VASCULAR CATHETERIZATION: SHX172C

## 2015-08-10 LAB — GLUCOSE, CAPILLARY
GLUCOSE-CAPILLARY: 128 mg/dL — AB (ref 65–99)
Glucose-Capillary: 108 mg/dL — ABNORMAL HIGH (ref 65–99)
Glucose-Capillary: 122 mg/dL — ABNORMAL HIGH (ref 65–99)
Glucose-Capillary: 109 mg/dL — ABNORMAL HIGH (ref 65–99)
Glucose-Capillary: 106 mg/dL — ABNORMAL HIGH (ref 65–99)

## 2015-08-10 LAB — POCT ACTIVATED CLOTTING TIME
ACTIVATED CLOTTING TIME: 146 s
Activated Clotting Time: 263 seconds
Activated Clotting Time: 208 seconds

## 2015-08-10 SURGERY — LOWER EXTREMITY ANGIOGRAPHY
Anesthesia: LOCAL

## 2015-08-10 MED ORDER — ESCITALOPRAM OXALATE 20 MG PO TABS
20.0000 mg | ORAL_TABLET | Freq: Every day | ORAL | Status: DC
  Filled 2015-08-10: qty 1

## 2015-08-10 MED ORDER — ALBUTEROL SULFATE (2.5 MG/3ML) 0.083% IN NEBU
2.5000 mg | INHALATION_SOLUTION | Freq: Four times a day (QID) | RESPIRATORY_TRACT | Status: DC
  Administered 2015-08-11: 07:00:00 2.5 mg via RESPIRATORY_TRACT
  Filled 2015-08-10 (×2): qty 3

## 2015-08-10 MED ORDER — METOPROLOL SUCCINATE ER 25 MG PO TB24
12.5000 mg | ORAL_TABLET | Freq: Every day | ORAL | Status: DC
  Administered 2015-08-11: 12.5 mg via ORAL
  Filled 2015-08-10: qty 1

## 2015-08-10 MED ORDER — ACETAMINOPHEN 325 MG PO TABS: 650.0000 mg | ORAL_TABLET | ORAL | Status: DC | PRN

## 2015-08-10 MED ORDER — MIDAZOLAM HCL 2 MG/2ML IJ SOLN
INTRAMUSCULAR | Status: DC | PRN
  Administered 2015-08-10: 1 mg via INTRAVENOUS

## 2015-08-10 MED ORDER — POTASSIUM CHLORIDE CRYS ER 20 MEQ PO TBCR
20.0000 meq | EXTENDED_RELEASE_TABLET | Freq: Every morning | ORAL | Status: DC
  Administered 2015-08-11: 20 meq via ORAL
  Filled 2015-08-10: qty 2

## 2015-08-10 MED ORDER — INFLUENZA VAC SPLIT QUAD 0.5 ML IM SUSY
0.5000 mL | PREFILLED_SYRINGE | INTRAMUSCULAR | Status: AC
  Administered 2015-08-11: 12:00:00 0.5 mL via INTRAMUSCULAR
  Filled 2015-08-10: qty 0.5

## 2015-08-10 MED ORDER — FENTANYL CITRATE (PF) 100 MCG/2ML IJ SOLN
INTRAMUSCULAR | Status: AC
  Filled 2015-08-10: qty 4

## 2015-08-10 MED ORDER — FLUTICASONE PROPIONATE 50 MCG/ACT NA SUSP
2.0000 | Freq: Every day | NASAL | Status: DC
  Administered 2015-08-11: 2 via NASAL
  Filled 2015-08-10: qty 16

## 2015-08-10 MED ORDER — ARMODAFINIL 150 MG PO TABS: 150.0000 mg | ORAL_TABLET | Freq: Every morning | ORAL | Status: DC

## 2015-08-10 MED ORDER — ISOSORBIDE MONONITRATE ER 60 MG PO TB24
60.0000 mg | ORAL_TABLET | Freq: Every day | ORAL | Status: DC
  Administered 2015-08-11: 60 mg via ORAL
  Filled 2015-08-10: qty 1

## 2015-08-10 MED ORDER — PANTOPRAZOLE SODIUM 40 MG PO TBEC
40.0000 mg | DELAYED_RELEASE_TABLET | Freq: Every day | ORAL | Status: DC
Start: 2015-08-11 — End: 2015-08-11
  Administered 2015-08-11: 40 mg via ORAL
  Filled 2015-08-10: qty 1

## 2015-08-10 MED ORDER — HEPARIN SODIUM (PORCINE) 1000 UNIT/ML IJ SOLN
INTRAMUSCULAR | Status: DC | PRN
  Administered 2015-08-10: 6000 [IU] via INTRAVENOUS

## 2015-08-10 MED ORDER — ASPIRIN 81 MG PO CHEW
81.0000 mg | CHEWABLE_TABLET | Freq: Every day | ORAL | Status: DC
  Administered 2015-08-11: 11:00:00 81 mg via ORAL
  Filled 2015-08-10 (×2): qty 1

## 2015-08-10 MED ORDER — SODIUM CHLORIDE 0.9 % IJ SOLN: 3.0000 mL | INTRAMUSCULAR | Status: DC | PRN

## 2015-08-10 MED ORDER — LIDOCAINE HCL (PF) 1 % IJ SOLN
INTRAMUSCULAR | Status: AC
  Filled 2015-08-10: qty 30

## 2015-08-10 MED ORDER — MOMETASONE FURO-FORMOTEROL FUM 200-5 MCG/ACT IN AERO
2.0000 | INHALATION_SPRAY | Freq: Two times a day (BID) | RESPIRATORY_TRACT | Status: DC
  Administered 2015-08-10 – 2015-08-11 (×2): 2 via RESPIRATORY_TRACT
  Filled 2015-08-10: qty 8.8

## 2015-08-10 MED ORDER — CLOPIDOGREL BISULFATE 300 MG PO TABS
ORAL_TABLET | ORAL | Status: DC | PRN
  Administered 2015-08-10: 300 mg via ORAL

## 2015-08-10 MED ORDER — ATORVASTATIN CALCIUM 20 MG PO TABS
20.0000 mg | ORAL_TABLET | Freq: Every day | ORAL | Status: DC
  Administered 2015-08-10 – 2015-08-11 (×2): 20 mg via ORAL
  Filled 2015-08-10 (×2): qty 1

## 2015-08-10 MED ORDER — IODIXANOL 320 MG/ML IV SOLN
INTRAVENOUS | Status: DC | PRN
  Administered 2015-08-10: 165 mL via INTRA_ARTERIAL

## 2015-08-10 MED ORDER — HYDRALAZINE HCL 20 MG/ML IJ SOLN: 10.0000 mg | INTRAMUSCULAR | Status: DC | PRN

## 2015-08-10 MED ORDER — SODIUM CHLORIDE 0.9 % WEIGHT BASED INFUSION: 1.0000 mL/kg/h | INTRAVENOUS | Status: DC

## 2015-08-10 MED ORDER — METHOCARBAMOL 500 MG PO TABS: 500.0000 mg | ORAL_TABLET | Freq: Two times a day (BID) | ORAL | Status: DC | PRN

## 2015-08-10 MED ORDER — GABAPENTIN 100 MG PO CAPS
100.0000 mg | ORAL_CAPSULE | Freq: Every day | ORAL | Status: DC
  Administered 2015-08-11: 11:00:00 100 mg via ORAL
  Filled 2015-08-10: qty 1

## 2015-08-10 MED ORDER — ASPIRIN 81 MG PO CHEW: 81.0000 mg | CHEWABLE_TABLET | ORAL | Status: DC

## 2015-08-10 MED ORDER — THEOPHYLLINE ER 400 MG PO CP24
400.0000 mg | ORAL_CAPSULE | Freq: Every day | ORAL | Status: DC
  Filled 2015-08-10: qty 1

## 2015-08-10 MED ORDER — MIDAZOLAM HCL 2 MG/2ML IJ SOLN
INTRAMUSCULAR | Status: AC
  Filled 2015-08-10: qty 4

## 2015-08-10 MED ORDER — LACTATED RINGERS IV SOLN: INTRAVENOUS | Status: DC

## 2015-08-10 MED ORDER — ALBUTEROL SULFATE (2.5 MG/3ML) 0.083% IN NEBU
2.5000 mg | INHALATION_SOLUTION | Freq: Four times a day (QID) | RESPIRATORY_TRACT | Status: DC
  Administered 2015-08-10: 18:00:00 2.5 mg via RESPIRATORY_TRACT
  Filled 2015-08-10: qty 3

## 2015-08-10 MED ORDER — GUAIFENESIN 100 MG/5ML PO SOLN: 5.0000 mL | ORAL | Status: DC | PRN

## 2015-08-10 MED ORDER — FLUOXETINE HCL 40 MG PO CAPS
40.0000 mg | ORAL_CAPSULE | Freq: Every morning | ORAL | Status: DC
  Administered 2015-08-11: 40 mg via ORAL
  Filled 2015-08-10: qty 1

## 2015-08-10 MED ORDER — MORPHINE SULFATE (PF) 2 MG/ML IV SOLN
2.0000 mg | INTRAVENOUS | Status: DC | PRN
  Administered 2015-08-10 – 2015-08-11 (×3): 2 mg via INTRAVENOUS
  Filled 2015-08-10 (×3): qty 1

## 2015-08-10 MED ORDER — ALPRAZOLAM 0.25 MG PO TABS
0.2500 mg | ORAL_TABLET | Freq: Every day | ORAL | Status: DC | PRN
Start: 2015-08-10 — End: 2015-08-11

## 2015-08-10 MED ORDER — CLOPIDOGREL BISULFATE 300 MG PO TABS
ORAL_TABLET | ORAL | Status: AC
  Filled 2015-08-10: qty 1

## 2015-08-10 MED ORDER — MIRTAZAPINE 7.5 MG PO TABS
45.0000 mg | ORAL_TABLET | Freq: Every day | ORAL | Status: DC
  Administered 2015-08-10: 22:00:00 45 mg via ORAL
  Filled 2015-08-10 (×2): qty 6

## 2015-08-10 MED ORDER — SODIUM CHLORIDE 0.9 % WEIGHT BASED INFUSION
3.0000 mL/kg/h | INTRAVENOUS | Status: AC
  Administered 2015-08-10: 3 mL/kg/h via INTRAVENOUS

## 2015-08-10 MED ORDER — SODIUM CHLORIDE 0.9 % IV SOLN: INTRAVENOUS | Status: AC

## 2015-08-10 MED ORDER — PROCHLORPERAZINE MALEATE 10 MG PO TABS
10.0000 mg | ORAL_TABLET | Freq: Four times a day (QID) | ORAL | Status: DC | PRN
  Filled 2015-08-10: qty 1

## 2015-08-10 MED ORDER — ARIPIPRAZOLE 5 MG PO TABS
5.0000 mg | ORAL_TABLET | Freq: Every day | ORAL | Status: DC
  Filled 2015-08-10 (×2): qty 1

## 2015-08-10 MED ORDER — ONDANSETRON HCL 4 MG/2ML IJ SOLN: 4.0000 mg | Freq: Four times a day (QID) | INTRAMUSCULAR | Status: DC | PRN

## 2015-08-10 MED ORDER — LIDOCAINE HCL (PF) 1 % IJ SOLN
INTRAMUSCULAR | Status: DC | PRN
  Administered 2015-08-10: 14:00:00

## 2015-08-10 MED ORDER — FENTANYL CITRATE (PF) 100 MCG/2ML IJ SOLN
INTRAMUSCULAR | Status: DC | PRN
  Administered 2015-08-10: 25 ug via INTRAVENOUS

## 2015-08-10 MED ORDER — TAMSULOSIN HCL 0.4 MG PO CAPS
0.4000 mg | ORAL_CAPSULE | Freq: Every day | ORAL | Status: DC
  Administered 2015-08-11: 11:00:00 0.4 mg via ORAL
  Filled 2015-08-10: qty 1

## 2015-08-10 MED ORDER — SUVOREXANT 10 MG PO TABS: 10.0000 mg | ORAL_TABLET | Freq: Every day | ORAL | Status: DC

## 2015-08-10 MED ORDER — ALBUTEROL SULFATE (2.5 MG/3ML) 0.083% IN NEBU: 2.5000 mg | INHALATION_SOLUTION | Freq: Four times a day (QID) | RESPIRATORY_TRACT | Status: DC | PRN

## 2015-08-10 MED ORDER — GUAIFENESIN ER 600 MG PO TB12
600.0000 mg | ORAL_TABLET | Freq: Two times a day (BID) | ORAL | Status: DC
  Administered 2015-08-10 – 2015-08-11 (×2): 600 mg via ORAL
  Filled 2015-08-10 (×3): qty 1

## 2015-08-10 MED ORDER — MODAFINIL 200 MG PO TABS: 200.0000 mg | ORAL_TABLET | Freq: Every morning | ORAL | Status: DC

## 2015-08-10 MED ORDER — THEOPHYLLINE ER 400 MG PO TB24
400.0000 mg | ORAL_TABLET | Freq: Every day | ORAL | Status: DC
  Administered 2015-08-10 – 2015-08-11 (×2): 400 mg via ORAL
  Filled 2015-08-10 (×2): qty 1

## 2015-08-10 MED ORDER — NITROGLYCERIN 0.4 MG SL SUBL: 0.4000 mg | SUBLINGUAL_TABLET | SUBLINGUAL | Status: DC | PRN

## 2015-08-10 MED ORDER — FENTANYL CITRATE (PF) 100 MCG/2ML IJ SOLN: 25.0000 ug | INTRAMUSCULAR | Status: DC | PRN

## 2015-08-10 MED ORDER — FINASTERIDE 5 MG PO TABS
5.0000 mg | ORAL_TABLET | Freq: Every day | ORAL | Status: DC
  Administered 2015-08-11: 5 mg via ORAL
  Filled 2015-08-10 (×2): qty 1

## 2015-08-10 MED ORDER — HEPARIN (PORCINE) IN NACL 2-0.9 UNIT/ML-% IJ SOLN
INTRAMUSCULAR | Status: AC
  Filled 2015-08-10: qty 1000

## 2015-08-10 SURGICAL SUPPLY — 18 items
BALLOON SABER 4.0X100X150 (BALLOONS) ×2 IMPLANT
CATH ANGIO 5F PIGTAIL 65CM (CATHETERS) ×2 IMPLANT
CATH VIANCE CROSS STAND 150CM (MICROCATHETER) ×2
CATH VIANCE CROSS STD 150CM (MICROCATHETER) ×1 IMPLANT
GUIDEWIRE ASTATO XS 20G 300CM (WIRE) ×2 IMPLANT
KIT ENCORE 26 ADVANTAGE (KITS) ×2 IMPLANT
KIT PV (KITS) ×2 IMPLANT
SHEATH BRITE TIP 7FR 35CM (SHEATH) ×4 IMPLANT
SHEATH PINNACLE 5F 10CM (SHEATH) ×4 IMPLANT
SHEATH PINNACLE 7F 10CM (SHEATH) ×2 IMPLANT
STENT ICAST 7X38X120 (Permanent Stent) ×4 IMPLANT
SYR MEDRAD MARK V 150ML (SYRINGE) ×2 IMPLANT
TAPE RADIOPAQUE TURBO (MISCELLANEOUS) ×2 IMPLANT
TRANSDUCER W/STOPCOCK (MISCELLANEOUS) ×2 IMPLANT
TRAY PV CATH (CUSTOM PROCEDURE TRAY) ×2 IMPLANT
TUBING CIL FLEX 10 FLL-RA (TUBING) ×2 IMPLANT
WIRE HITORQ VERSACORE ST 145CM (WIRE) ×4 IMPLANT
WIRE ROSEN-J .035X180CM (WIRE) ×2 IMPLANT

## 2015-08-10 NOTE — Progress Notes (Signed)
Site area: left groin  Site Prior to Removal:  Level 0  Pressure Applied For 25 MINUTES    Minutes Beginning at 1700  Manual:   Yes.    Patient Status During Pull:  stable  Post Pull Groin Site:  Level 0  Post Pull Instructions Given:  Yes.    Post Pull Pulses Present:  Yes.    Dressing Applied:  Yes.    Site area: right groin  Site Prior to Removal:  Level 0  Pressure Applied For 25 MINUTES    Minutes Beginning at 1730  Manual:   Yes.    Patient Status During Pull:  stable  Post Pull Groin Site:  Level 0  Post Pull Instructions Given:  Yes.    Post Pull Pulses Present:  Yes.    Dressing Applied:  Yes.    Checked sites frequently through remainder of shift with no change in assessment

## 2015-08-10 NOTE — Interval H&P Note (Signed)
History and Physical Interval Note:  08/10/2015 1:37 PM  Ernest [name]  has presented today for surgery, with the diagnosis of pvd  The various methods of treatment have been discussed with the patient and family. After consideration of risks, benefits and other options for treatment, the patient has consented to  Procedure(s): Lower Extremity Angiography (N/A) as a surgical intervention .  The patient's history has been reviewed, patient examined, no change in status, stable for surgery.  I have reviewed the patient's chart and labs.  Questions were answered to the patient's satisfaction.     Quay Burow

## 2015-08-10 NOTE — H&P (View-Only) (Signed)
08/01/2015 Ernest [name]   02-02-1956  [id number]  Primary Physician Drema Pry, DO Primary Cardiologist: Ernest Harp MD Renae Gloss   HPI:  Ernest [name] is a 59 year old moderately overweight married Caucasian male children (one deceased child) patient of Dr. Rosezella [place] and Dr. Lora Havens referred for peripheral vascular evaluation because of claudication and worsening Doppler. 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 His cardiovascular risk factor profile is notable for greater than 100 pack years of tobacco abuse having stopped last year. 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 His risk factors otherwise include a family history of heart disease with both parents who had myocardial infarctions. He has treated hypertension, diabetes and hyperlipidemia. He has had stenting of his left external iliac artery by Dr. Burt Knack in 2009. Over the last 2 months he's had worsening right lower extremity claudication with recent Dopplers performed 9/120/16 revealing a right ABI 0.36 with an occluded right iliac and a left ABI of 0.9.   Current Outpatient Prescriptions  Medication Sig Dispense Refill  . albuterol (PROVENTIL) (2.5 MG/3ML) 0.083% nebulizer solution INHALE 1 VIAL IN NEBULIZER EVERY 4 HOURS AS NEEDED FOR WHEEZING OR SHORTNESS OF BREATH 300 mL 11  . ALPRAZolam (XANAX) 0.25 MG tablet Take 0.25 mg by mouth daily as needed for anxiety.     . ARIPiprazole (ABILIFY) 5 MG tablet Take 1 tablet (5 mg total) by mouth at bedtime.    . Armodafinil (NUVIGIL) 150 MG tablet Take 150 mg by mouth every morning.     [name] aspirin 81 MG EC tablet TAKE 1 TABLET EVERY DAY 30 tablet 5  . atorvastatin (LIPITOR) 20 MG tablet TAKE 1 TABLET EVERY DAY 90 tablet 3  . BD PEN NEEDLE NANO U/F 32G X 4 MM MISC 1 EACH BY DOES NOT APPLY ROUTE DAILY. 100 each 3  . clonazePAM (KLONOPIN) 1 MG tablet Take 2 mg by mouth at bedtime as needed for  anxiety.     [name] escitalopram (LEXAPRO) 20 MG tablet Take 1 tablet (20 mg total) by mouth daily. 90 tablet 0  . esomeprazole (NEXIUM) 40 MG capsule Take 40 mg by mouth every morning.     . finasteride (PROSCAR) 5 MG tablet Take 1 tablet (5 mg total) by mouth daily. 30 tablet 3  . FLUoxetine (PROZAC) 40 MG capsule Take 40 mg by mouth every morning.    . Fluticasone-Salmeterol (ADVAIR DISKUS) 500-50 MCG/DOSE AEPB 1 puff then rinse mouth, twice daily maintenance 60 each 11  . gabapentin (NEURONTIN) 100 MG capsule Take 1 capsule by mouth daily. Take 1 cap daily    . Insulin Glargine (LANTUS SOLOSTAR) 100 UNIT/ML Solostar Pen 5 units at bedtime. 5 pen PRN  . isosorbide mononitrate (IMDUR) 60 MG 24 hr tablet Take 1 tablet (60 mg total) by mouth daily. 90 tablet 3  . metFORMIN (GLUCOPHAGE-XR) 500 MG 24 hr tablet Take 2 tablets (1,000 mg total) by mouth daily with breakfast. 180 tablet 1  . methocarbamol (ROBAXIN) 500 MG tablet Take 500 mg by mouth 2 (two) times daily as needed for muscle spasms.     . metoprolol succinate (TOPROL-XL) 25 MG 24 hr tablet TAKE 1/2 TABLET BY MOUTH DAILY 30 tablet 3  . mirtazapine (REMERON) 45 MG tablet Take 1 tablet (45 mg total) by mouth at bedtime.    [name] morphine (MS CONTIN) 30 MG 12 hr tablet Take 1 tablet (30 mg total) by mouth  every 12 (twelve) hours.    [name] morphine (MSIR) 15 MG tablet Take 1 tablet (15 mg total) by mouth every 4 (four) hours as needed for severe pain. 30 tablet 0  . nitroGLYCERIN (NITROSTAT) 0.4 MG SL tablet Place 1 tablet (0.4 mg total) under the tongue every 5 (five) minutes as needed for chest pain. 25 tablet 3  . nystatin (MYCOSTATIN) 100000 UNIT/ML suspension Take 5 mLs (500,000 Units total) by mouth 4 (four) times daily. 60 mL 1  . polyethylene glycol powder (GLYCOLAX/MIRALAX) powder Take as directed for colonoscopy. 255 g 0  . potassium chloride SA (K-DUR,KLOR-CON) 20 MEQ tablet Take 20 mEq by mouth every morning.     . predniSONE (DELTASONE) 10 MG  tablet Take 4 tablets twice daily for 3 days, then take 4 tablets once daily for 4 days, then take 2 tablets once daily for 4 days, then take 1 tablets once daily as before. 60 tablet 0  . predniSONE (DELTASONE) 10 MG tablet 4 tabs for 2 days, then 3 tabs for 2 days, 2 tabs for 2 days, then 1 tab for 2 days, then stop 20 tablet 0  . prochlorperazine (COMPAZINE) 10 MG tablet TAKE 1 TABLET BY MOUTH EVERY 6 HOURS AS NEEDED FOR NAUSEA AND VOMITING 60 tablet 3  . tamsulosin (FLOMAX) 0.4 MG CAPS capsule Take 1 capsule (0.4 mg total) by mouth daily. 30 capsule 3  . theophylline (THEO-24) 400 MG 24 hr capsule Take 1 capsule (400 mg total) by mouth daily. 30 capsule 5  . VENTOLIN HFA 108 (90 BASE) MCG/ACT inhaler INHALE 2 PUFFS INTO THE LUNGS 4 TIMES DAILY AS NEEDED FOR WHEEZING 18 Inhaler 11  . VOLTAREN 1 % GEL Apply 2 g topically daily as needed (for pain).     . fluticasone (FLONASE) 50 MCG/ACT nasal spray Place 2 sprays into both nostrils daily. (Patient taking differently: Place 2 sprays into both nostrils daily as needed for allergies or rhinitis. ) 16 g 3   No current facility-administered medications for this visit.   Facility-Administered Medications Ordered in Other Visits  Medication Dose Route Frequency Provider Last Rate Last Dose  . DOBUTamine (DOBUTREX) 1,000 mcg/mL in dextrose 5% 250 mL infusion  0-40 mcg/kg/min Intravenous Continuous Fay Records, MD 224.2 mL/hr at 06/12/15 1117 40 mcg/kg/min at 06/12/15 1117    Allergies  Allergen Reactions  . Carboplatin Shortness Of Breath, Swelling and Rash    Swelling of lips, rash on face,eyes and head    Social History   Social History  . Marital Status: Married    Spouse Name: N/A  . Number of Children: N/A  . Years of Education: N/A   Occupational History  . Disabled welder    Social History Main Topics  . Smoking status: Former Smoker -- 0.50 packs/day for 43 years    Types: Cigarettes    Quit date: 09/23/2013  . Smokeless  tobacco: Never Used     Comment: history of 3 PPD, 11/02/13   . Alcohol Use: No  . Drug Use: No  . Sexual Activity: No   Other Topics Concern  . Not on file   Social History Narrative     Review of Systems: General: negative for chills, fever, night sweats or weight changes.  Cardiovascular: negative for chest pain, dyspnea on exertion, edema, orthopnea, palpitations, paroxysmal nocturnal dyspnea or shortness of breath Dermatological: negative for rash Respiratory: negative for cough or wheezing Urologic: negative for hematuria Abdominal: negative for nausea, vomiting, diarrhea, bright  red blood per rectum, melena, or hematemesis Neurologic: negative for visual changes, syncope, or dizziness All other systems reviewed and are otherwise negative except as noted above.    Blood pressure 140/80, pulse 96, height '5\' 6"'$  (1.676 m), weight 205 lb (92.987 kg).  General appearance: alert and no distress Neck: no adenopathy, no JVD, supple, symmetrical, trachea midline, thyroid not enlarged, symmetric, no tenderness/mass/nodules and soft right carotid bruit Lungs: clear to auscultation bilaterally Heart: regular rate and rhythm, S1, S2 normal, no murmur, click, rub or gallop Extremities: extremities normal, atraumatic, no cyanosis or edema Pulses: 2+ and symmetric absent right femoral pulse, 2+ left femoral pulse with bruit Skin: Skin color, texture, turgor normal. No rashes or lesions Neurologic: Grossly normal  EKG not performed today  ASSESSMENT AND PLAN:   Peripheral vascular disease History of PAD status post left external iliac artery stenting by Dr. Burt Knack in 2009. He has had right calf claudication for the last several months. Recent Dopplers performed 07/27/15 revealed a decline in his right ABI 2.36 with what appears to be occlusion of his right common iliac artery. His left external iliac artery appears patent with a left ABI 0.9. He is symptomatic. We will plan on performing  angiography and potential intervention.      Ernest Harp MD FACP,FACC,FAHA, [facility] 08/01/2015 2:59 PM

## 2015-08-11 ENCOUNTER — Other Ambulatory Visit: Payer: Self-pay | Admitting: Physician Assistant

## 2015-08-11 DIAGNOSIS — I745 Embolism and thrombosis of iliac artery: Secondary | ICD-10-CM

## 2015-08-11 DIAGNOSIS — J449 Chronic obstructive pulmonary disease, unspecified: Secondary | ICD-10-CM | POA: Diagnosis not present

## 2015-08-11 DIAGNOSIS — I739 Peripheral vascular disease, unspecified: Secondary | ICD-10-CM

## 2015-08-11 DIAGNOSIS — I10 Essential (primary) hypertension: Secondary | ICD-10-CM | POA: Diagnosis not present

## 2015-08-11 DIAGNOSIS — I70211 Atherosclerosis of native arteries of extremities with intermittent claudication, right leg: Secondary | ICD-10-CM | POA: Diagnosis not present

## 2015-08-11 DIAGNOSIS — D696 Thrombocytopenia, unspecified: Secondary | ICD-10-CM

## 2015-08-11 DIAGNOSIS — Z87891 Personal history of nicotine dependence: Secondary | ICD-10-CM | POA: Diagnosis not present

## 2015-08-11 DIAGNOSIS — Z23 Encounter for immunization: Secondary | ICD-10-CM | POA: Diagnosis not present

## 2015-08-11 DIAGNOSIS — Z79899 Other long term (current) drug therapy: Secondary | ICD-10-CM | POA: Diagnosis not present

## 2015-08-11 DIAGNOSIS — Z794 Long term (current) use of insulin: Secondary | ICD-10-CM | POA: Diagnosis not present

## 2015-08-11 DIAGNOSIS — I2510 Atherosclerotic heart disease of native coronary artery without angina pectoris: Secondary | ICD-10-CM | POA: Diagnosis not present

## 2015-08-11 DIAGNOSIS — E785 Hyperlipidemia, unspecified: Secondary | ICD-10-CM | POA: Diagnosis not present

## 2015-08-11 LAB — GLUCOSE, CAPILLARY
GLUCOSE-CAPILLARY: 104 mg/dL — AB (ref 65–99)
Glucose-Capillary: 149 mg/dL — ABNORMAL HIGH (ref 65–99)

## 2015-08-11 LAB — BASIC METABOLIC PANEL
ANION GAP: 8 (ref 5–15)
BUN: 7 mg/dL (ref 6–20)
CO2: 29 mmol/L (ref 22–32)
Calcium: 9 mg/dL (ref 8.9–10.3)
Chloride: 101 mmol/L (ref 101–111)
Creatinine, Ser: 0.82 mg/dL (ref 0.61–1.24)
GFR calc Af Amer: >60 mL/min (ref >60)
GLUCOSE: 132 mg/dL — AB (ref 65–99)
POTASSIUM: 4 mmol/L (ref 3.5–5.1)
Sodium: 138 mmol/L (ref 135–145)

## 2015-08-11 LAB — CBC
HEMATOCRIT: 41 % (ref 39.0–52.0)
HEMOGLOBIN: 12.7 g/dL — AB (ref 13.0–17.0)
MCH: 29.1 pg (ref 26.0–34.0)
MCHC: 31 g/dL (ref 30.0–36.0)
MCV: 93.8 fL (ref 78.0–100.0)
Platelets: 83 10*3/uL — ABNORMAL LOW (ref 150–400)
RBC: 4.37 MIL/uL (ref 4.22–5.81)
RDW: 14.5 % (ref 11.5–15.5)
WBC: 8 10*3/uL (ref 4.0–10.5)

## 2015-08-11 MED ORDER — LIVING WELL WITH DIABETES BOOK
Freq: Once | Status: AC
  Administered 2015-08-11: 10:00:00
  Filled 2015-08-11: qty 1

## 2015-08-11 MED ORDER — CLOPIDOGREL BISULFATE 75 MG PO TABS: 75.0000 mg | ORAL_TABLET | Freq: Every day | ORAL | Status: DC

## 2015-08-11 MED ORDER — CLOPIDOGREL BISULFATE 75 MG PO TABS
75.0000 mg | ORAL_TABLET | Freq: Every day | ORAL | Status: DC
  Administered 2015-08-11: 11:00:00 75 mg via ORAL
  Filled 2015-08-11: qty 1

## 2015-08-11 NOTE — Discharge Summary (Signed)
Discharge Summary   Patient ID: Ernest [name],  MRN: [id number], DOB/AGE: 1956/09/06 59 y.o.  Admit date: 08/10/2015 Discharge date: 08/11/2015  Primary Care Provider: Drema Pry Primary Cardiologist: Dr. Percival Spanish PV specialist: Dr. Gwenlyn Found  Discharge Diagnoses Active Problems:   Peripheral vascular disease   Iliac artery occlusion   Claudication   Allergies Allergies  Allergen Reactions  . Carboplatin Shortness Of Breath, Swelling and Rash    Swelling of lips, rash on face,eyes and head    Procedures  LE angiography 08/10/2015  Procedures Performed: 1. Abdominal aortogram, bilateral iliac angiogram 2. PTA and stent right common iliac artery chronic total occlusion  Angiographic Data:   1: Abdominal aortogram-the distal dominant aorta was free of significant disease 2: Left lower extremity-the left external iliac artery stent was widely patent. There was 30% left common femoral artery stenosis. 3: Right lower extremity-the right common iliac artery was occluded at its origin reconstituting just above the takeoff of the hypogastric artery. There was a 50-60% calcified stenosis in the distal right common femoral artery.  IMPRESSION:Ernest [name] has an occluded right common iliac artery. We will proceed with attempt at PTA and stenting using overlapping ICast covered stents.  Final Impression: successful PTA and stenting of a long chronic total occlusion involving the right common iliac artery with overlapping ICast covered stents with an excellent angiographic result. The patient tolerated the procedure well. He received 300 mg of by mouth Plavix. The sheaths will be removed and pressure held on the groins to achieve hemostasis with the asteatotic below 170. He'll be hydrated overnight, discharged home in the morning. He will get lower extremity arterial Doppler studies as well as abdominal ultrasound in our [facility] office next week and I will see  him back to 3 weeks thereafter.    Hospital Course  The patient is a 59 year old Caucasian male with past medical history of COPD, lung cancer status post chemotherapy, hypertension, hyperlipidemia, tobacco abuse, obstructive sleep apnea, CAD and PAD was referred by Dr. Percival Spanish to Dr. Gwenlyn Found for evaluation of claudication symptoms. He had remote left external iliac artery stent by Dr. Burt Knack in 2009. For the past 2 months, he has worsening right lower extremity claudication symptoms. Recent Doppler obtained in the office on 08/07/2015 showed R ABI 0.36 with occluded R iliac and L AKI 0.9. He was last seen by Dr. Gwenlyn Found in the clinic on 08/01/2015, after discussing various options, he agreed to undergo lower extremity angiography.  He presented for the scheduled procedure on 08/10/2015, which showed 30% left common femoral artery stenosis, right common iliac artery occluded at its origin reconstituting just above the takeoff of hypogastric artery, 50-60% calcified stenosis in the distal right common femoral artery. He underwent successful PTA and stenting of the long chronic total occlusion involving the right common femoral artery with overlapping ICast covered stents. During the procedure, he received 300 mg PO plavix. He was seen in AM of 9/16 which which time he was doing well. He did have some R leg pain, but cath site look clean without bleeding or tenderness on palpation. Bedside portable doppler shows good pulse. He is deemed stable for discharge on ASA and plavix. He will need followup on his thrombocytopenia with platelet level 83. Outpatient doppler and followup has been arranged.   Discharge Vitals Blood pressure 137/64, pulse 63, temperature 97.9 F (36.6 C), temperature source Oral, resp. rate 18, height '5\' 6"'$  (1.676 m), weight 197 lb 8.5 oz (89.6 kg), SpO2 100 %.  Filed  Weights   08/10/15 0914 08/11/15 0358  Weight: 205 lb (92.987 kg) 197 lb 8.5 oz (89.6 kg)    Labs  CBC  Recent Labs   08/11/15 0358  WBC 8.0  HGB 12.7*  HCT 41.0  MCV 93.8  PLT 83*   Basic Metabolic Panel  Recent Labs  08/11/15 0358  NA 138  K 4.0  CL 101  CO2 29  GLUCOSE 132*  BUN 7  CREATININE 0.82  CALCIUM 9.0    Disposition  Pt is being discharged home today in good condition.  Follow-up Plans & Appointments      Follow-up Information    Follow up with [facility] On 08/30/2015.   Specialty:  Cardiology   Why:  9:30am. Rosaria Ferries and Dr. Gwenlyn Found. Obtain CBC on the same day to monitor platelet level   Contact information:   [place] [phone]      Follow up with [place].   Specialty:  Cardiology   Why:  Office will contact you to arrange lower extremity arterial doppler, please give [place] a call if you do not hear from [place] in 2 days.   Contact information:   [place] [phone]      Discharge Medications    Medication List    STOP taking these medications        fluticasone 50 MCG/ACT nasal spray  Commonly known as:  FLONASE     predniSONE 10 MG tablet  Commonly known as:  DELTASONE      TAKE these medications        albuterol (2.5 MG/3ML) 0.083% nebulizer solution  Commonly known as:  PROVENTIL  INHALE 1 VIAL IN NEBULIZER EVERY 4 HOURS AS NEEDED FOR WHEEZING OR SHORTNESS OF BREATH     VENTOLIN HFA 108 (90 BASE) MCG/ACT inhaler  Generic drug:  albuterol  INHALE 2 PUFFS INTO THE LUNGS 4 TIMES DAILY AS NEEDED FOR WHEEZING     ALPRAZolam 0.25 MG tablet  Commonly known as:  XANAX  Take 0.25 mg by mouth daily as needed for anxiety.     ARIPiprazole 5 MG tablet  Commonly known as:  ABILIFY  Take 1 tablet (5 mg total) by mouth at bedtime.     aspirin 81 MG EC tablet  TAKE 1 TABLET EVERY DAY     atorvastatin 20 MG tablet  Commonly known as:  LIPITOR  TAKE 1 TABLET EVERY DAY     BD PEN NEEDLE NANO U/F 32G X 4 MM Misc   Generic drug:  Insulin Pen Needle  1 EACH BY DOES NOT APPLY ROUTE DAILY.     BELSOMRA 10 MG Tabs  Generic drug:  Suvorexant  Take 10 mg by mouth at bedtime.     clonazePAM 1 MG tablet  Commonly known as:  KLONOPIN  Take 2 mg by mouth at bedtime as needed for anxiety.     clopidogrel 75 MG tablet  Commonly known as:  PLAVIX  Take 1 tablet (75 mg total) by mouth daily.     escitalopram 20 MG tablet  Commonly known as:  LEXAPRO  Take 1 tablet (20 mg total) by mouth daily.     esomeprazole 40 MG capsule  Commonly known as:  NEXIUM  Take 40 mg by mouth every morning.     finasteride 5 MG tablet  Commonly known as:  PROSCAR  Take 1 tablet (5 mg total)  by mouth daily.     FLUoxetine 40 MG capsule  Commonly known as:  PROZAC  Take 40 mg by mouth every morning.     gabapentin 100 MG capsule  Commonly known as:  NEURONTIN  Take 100 mg by mouth daily. Take 1 cap daily     Insulin Glargine 100 UNIT/ML Solostar Pen  Commonly known as:  LANTUS SOLOSTAR  5 units at bedtime.     isosorbide mononitrate 60 MG 24 hr tablet  Commonly known as:  IMDUR  Take 1 tablet (60 mg total) by mouth daily.     metFORMIN 500 MG 24 hr tablet  Commonly known as:  GLUCOPHAGE-XR  Take 2 tablets (1,000 mg total) by mouth daily with breakfast.     methocarbamol 500 MG tablet  Commonly known as:  ROBAXIN  Take 500 mg by mouth 2 (two) times daily as needed for muscle spasms.     metoprolol succinate 25 MG 24 hr tablet  Commonly known as:  TOPROL-XL  TAKE 1/2 TABLET BY MOUTH DAILY     mirtazapine 45 MG tablet  Commonly known as:  REMERON  Take 1 tablet (45 mg total) by mouth at bedtime.     morphine 30 MG 12 hr tablet  Commonly known as:  MS CONTIN  Take 1 tablet (30 mg total) by mouth every 12 (twelve) hours.     morphine 15 MG tablet  Commonly known as:  MSIR  Take 1 tablet (15 mg total) by mouth every 4 (four) hours as needed for severe pain.     nitroGLYCERIN 0.4 MG SL tablet    Commonly known as:  NITROSTAT  Place 1 tablet (0.4 mg total) under the tongue every 5 (five) minutes as needed for chest pain.     NUVIGIL 150 MG tablet  Generic drug:  Armodafinil  Take 150 mg by mouth every morning.     nystatin 100000 UNIT/ML suspension  Commonly known as:  MYCOSTATIN  Take 5 mLs (500,000 Units total) by mouth 4 (four) times daily.     polyethylene glycol powder powder  Commonly known as:  GLYCOLAX/MIRALAX  Take as directed for colonoscopy.     potassium chloride SA 20 MEQ tablet  Commonly known as:  K-DUR,KLOR-CON  Take 20 mEq by mouth every morning.     prochlorperazine 10 MG tablet  Commonly known as:  COMPAZINE  TAKE 1 TABLET BY MOUTH EVERY 6 HOURS AS NEEDED FOR NAUSEA AND VOMITING     tamsulosin 0.4 MG Caps capsule  Commonly known as:  FLOMAX  Take 1 capsule (0.4 mg total) by mouth daily.     theophylline 400 MG 24 hr capsule  Commonly known as:  THEO-24  Take 1 capsule (400 mg total) by mouth daily.     VOLTAREN 1 % Gel  Generic drug:  diclofenac sodium  Apply 2 g topically daily as needed (for pain).      ASK your doctor about these medications        Fluticasone-Salmeterol 500-50 MCG/DOSE Aepb  Commonly known as:  ADVAIR DISKUS  1 puff then rinse mouth, twice daily maintenance        Outstanding Labs/Studies  Obtain CBC lab on followup to check platelet level  Duration of Discharge Encounter   Greater than 30 minutes including physician time.  Hilbert Corrigan PA-C Pager: [phone] 08/11/2015, 11:28 AM

## 2015-08-11 NOTE — Progress Notes (Signed)
Patient Name: Ernest [name] Date of Encounter: 08/11/2015  Primary Cardiologist: Dr. Percival Spanish PV specialist: Dr. Gwenlyn Found   Active Problems:   Peripheral vascular disease   Iliac artery occlusion   Claudication    SUBJECTIVE  Denies any CP or SOB. No R groin pain after R femoral cath, does have some R sided leg pain.  CURRENT MEDS . albuterol  2.5 mg Nebulization QID  . ARIPiprazole  5 mg Oral QHS  . aspirin  81 mg Oral Daily  . atorvastatin  20 mg Oral Daily  . escitalopram  20 mg Oral Daily  . finasteride  5 mg Oral Daily  . FLUoxetine  40 mg Oral q morning - 10a  . fluticasone  2 spray Each Nare Daily  . gabapentin  100 mg Oral Daily  . guaiFENesin  600 mg Oral BID  . Influenza vac split quadrivalent PF  0.5 mL Intramuscular Tomorrow-1000  . isosorbide mononitrate  60 mg Oral Daily  . metoprolol succinate  12.5 mg Oral Daily  . mirtazapine  45 mg Oral QHS  . modafinil  200 mg Oral q morning - 10a  . mometasone-formoterol  2 puff Inhalation BID  . pantoprazole  40 mg Oral Daily  . potassium chloride SA  20 mEq Oral q morning - 10a  . Suvorexant  10 mg Oral QHS  . tamsulosin  0.4 mg Oral Daily  . theophylline  400 mg Oral Daily    OBJECTIVE  Filed Vitals:   08/10/15 2100 08/10/15 2101 08/10/15 2308 08/11/15 0358  BP: 139/60 139/60  139/55  Pulse:  58 59 62  Temp:  97.9 F (36.6 C)  97.8 F (36.6 C)  TempSrc:  Oral  Oral  Resp: '19 21  22  '$ Height:      Weight:    197 lb 8.5 oz (89.6 kg)  SpO2:  99% 99% 100%    Intake/Output Summary (Last 24 hours) at 08/11/15 0639 Last data filed at 08/10/15 1900  Gross per 24 hour  Intake 271.25 ml  Output      0 ml  Net 271.25 ml   Filed Weights   08/10/15 0914 08/11/15 0358  Weight: 205 lb (92.987 kg) 197 lb 8.5 oz (89.6 kg)    PHYSICAL EXAM  General: Pleasant, NAD. Neuro: Alert and oriented X 3. Moves all extremities spontaneously. Psych: Normal affect. HEENT:  Normal  Neck: Supple without bruits or  JVD. Lungs:  Resp regular and unlabored. Decreased breath sound throughout with wheezing Heart: RRR no s3, s4, or murmurs. Abdomen: Soft, non-tender, non-distended, BS + x 4.  Extremities: No clubbing, cyanosis or edema. DP/PT/Radials 2+ and equal bilaterally.  Accessory Clinical Findings  CBC  Recent Labs  08/11/15 0358  WBC 8.0  HGB 12.7*  HCT 41.0  MCV 93.8  PLT 83*   Basic Metabolic Panel  Recent Labs  08/11/15 0358  NA 138  K 4.0  CL 101  CO2 29  GLUCOSE 132*  BUN 7  CREATININE 0.82  CALCIUM 9.0    TELE NSR without significant ventricular ectopy    ECG  No new EKG  Echocardiogram 04/13/2015  LV EF: 55% -  60%  ------------------------------------------------------------------- Indications:   Dyspnea 786.09.  ------------------------------------------------------------------- History:  PMH:  Chest pain. Coronary artery disease. Chronic obstructive pulmonary disease. Risk factors: Current tobacco use. Hypertension. Dyslipidemia.  ------------------------------------------------------------------- Study Conclusions  - Left ventricle: The cavity size was normal. Systolic function was normal. The estimated ejection fraction was in the  range of 55% to 60%. Wall motion was normal; there were no regional wall motion abnormalities. - Mitral valve: There was mild regurgitation. - Left atrium: The atrium was mildly dilated. - Right ventricle: The cavity size was mildly dilated. Wall thickness was normal. - Right atrium: The atrium was mildly dilated.     Radiology/Studies  Dg Chest 2 View  07/14/2015   CLINICAL DATA:  Cough, congestion and shortness of breath for 3 weeks.  EXAM: CHEST  2 VIEW  COMPARISON:  Apr 13, 2015, December 15, 2014, February 15, 2014  FINDINGS: The heart size and mediastinal contours are stable. Stable thickening/consolidation of the left apex is unchanged. There is no focal pneumonia, pulmonary edema, or pleural  effusion. The visualized skeletal structures are stable.  IMPRESSION: No active cardiopulmonary disease.   Electronically Signed   By: Abelardo Diesel M.D.   On: 07/14/2015 18:01    ASSESSMENT AND PLAN  59 yo male with CAD, COPD, HLD, PVD, OSA, HTN, tobacco abuse and DM referred to Dr. Gwenlyn Found for claudication symptom.   1. PAD  - s/p L external iliac artery stent by Dr. Burt Knack 2009  - worsening RLE claudication in last 2 month with recent doppler on 08/07/2015 R ABI 0.36 with occluded R iliac and L AKI 0.9  - s/p bilateral iliac angiography, s/p PTA and stent R common iliac artery chronic total occlusion (7 mm x 38 mm long ICast covered stents).  - continue ASA, he was loaded with '300mg'$  plavix yesterday, but there is no plavix ordered this morning.   - some R sided leg pain this morning, bedside doppler shows good pulse. R femoral cath site stable, no sign of hematoma or bleeding, no tenderness on palpation.   2. CAD 3. HTN 4. HLD 5. OSA  6. tobacco abuse 7. COPD 8. Worsening thrombocytopenia: platelet 127 two month ago, now 56, will discuss with MD 9. H/o lung CA s/p chemo   Signed, Almyra Deforest PA-C Pager: [phone]  Patient seen, examined. Available data reviewed. Agree with findings, assessment, and plan as outlined by Almyra Deforest, PA-C. Bilateral groin sites are clear. Feet are warm bilaterally. Agree with plan as above. ASA/Plavix at discharge. Mild thrombocytopenia noted would repeat at time of follow-up within next 2 weeks. Further eval as indicated per Dr Gwenlyn Found or [place].  Sherren Mocha, M.D. 08/11/2015 10:53 AM

## 2015-08-11 NOTE — Discharge Instructions (Signed)
No driving for 24 hours. No lifting over 5 lbs for 1 week. No sexual activity for 1 week. Keep procedure site clean & dry. If you notice increased pain, swelling, bleeding or pus, call/return!  You may shower, but no soaking baths/hot tubs/pools for 1 week.   Please hold Metfomin for 48 hours after cath due to interaction with contrast dye, you can restart on 08/13/2015

## 2015-08-16 ENCOUNTER — Encounter (HOSPITAL_COMMUNITY): Payer: Commercial Managed Care - HMO

## 2015-08-17 ENCOUNTER — Other Ambulatory Visit: Payer: Self-pay | Admitting: Physician Assistant

## 2015-08-17 DIAGNOSIS — I739 Peripheral vascular disease, unspecified: Secondary | ICD-10-CM

## 2015-08-17 DIAGNOSIS — I700 Atherosclerosis of aorta: Secondary | ICD-10-CM

## 2015-08-19 DIAGNOSIS — J449 Chronic obstructive pulmonary disease, unspecified: Secondary | ICD-10-CM | POA: Diagnosis not present

## 2015-08-23 ENCOUNTER — Telehealth: Payer: Self-pay | Admitting: Internal Medicine

## 2015-08-23 ENCOUNTER — Ambulatory Visit: Payer: Commercial Managed Care - HMO | Admitting: Internal Medicine

## 2015-08-23 NOTE — Telephone Encounter (Signed)
Patient is wanting to change PCP from Dr. Shawna Orleans to Dr. Ronnald Ramp Please advise

## 2015-08-23 NOTE — Telephone Encounter (Signed)
Ok with me if Dr Ronnald Ramp will accept Ernest [name]

## 2015-08-24 ENCOUNTER — Other Ambulatory Visit: Payer: Self-pay | Admitting: Physician Assistant

## 2015-08-24 ENCOUNTER — Ambulatory Visit (HOSPITAL_COMMUNITY)
Admission: RE | Admit: 2015-08-24 | Discharge: 2015-08-24 | Disposition: A | Discharge status: Home / Self Care | Payer: Commercial Managed Care - HMO | Source: Ambulatory Visit | Attending: Cardiovascular Disease | Admitting: Cardiovascular Disease

## 2015-08-24 ENCOUNTER — Ambulatory Visit (HOSPITAL_BASED_OUTPATIENT_CLINIC_OR_DEPARTMENT_OTHER)
Admission: RE | Admit: 2015-08-24 | Discharge: 2015-08-24 | Disposition: A | Discharge status: Home / Self Care | Payer: Commercial Managed Care - HMO | Source: Ambulatory Visit | Attending: Cardiovascular Disease | Admitting: Cardiovascular Disease

## 2015-08-24 DIAGNOSIS — J449 Chronic obstructive pulmonary disease, unspecified: Secondary | ICD-10-CM | POA: Diagnosis not present

## 2015-08-24 DIAGNOSIS — I739 Peripheral vascular disease, unspecified: Secondary | ICD-10-CM

## 2015-08-24 DIAGNOSIS — E785 Hyperlipidemia, unspecified: Secondary | ICD-10-CM | POA: Insufficient documentation

## 2015-08-24 DIAGNOSIS — I700 Atherosclerosis of aorta: Secondary | ICD-10-CM

## 2015-08-24 DIAGNOSIS — Z87891 Personal history of nicotine dependence: Secondary | ICD-10-CM | POA: Diagnosis not present

## 2015-08-24 DIAGNOSIS — Z95828 Presence of other vascular implants and grafts: Secondary | ICD-10-CM | POA: Insufficient documentation

## 2015-08-24 DIAGNOSIS — E119 Type 2 diabetes mellitus without complications: Secondary | ICD-10-CM | POA: Diagnosis not present

## 2015-08-24 DIAGNOSIS — I10 Essential (primary) hypertension: Secondary | ICD-10-CM | POA: Diagnosis not present

## 2015-08-24 DIAGNOSIS — I2510 Atherosclerotic heart disease of native coronary artery without angina pectoris: Secondary | ICD-10-CM | POA: Insufficient documentation

## 2015-08-25 DIAGNOSIS — J449 Chronic obstructive pulmonary disease, unspecified: Secondary | ICD-10-CM | POA: Diagnosis not present

## 2015-08-25 DIAGNOSIS — M47817 Spondylosis without myelopathy or radiculopathy, lumbosacral region: Secondary | ICD-10-CM | POA: Diagnosis not present

## 2015-08-25 DIAGNOSIS — M1611 Unilateral primary osteoarthritis, right hip: Secondary | ICD-10-CM | POA: Diagnosis not present

## 2015-08-25 DIAGNOSIS — C3490 Malignant neoplasm of unspecified part of unspecified bronchus or lung: Secondary | ICD-10-CM | POA: Diagnosis not present

## 2015-08-25 DIAGNOSIS — J9612 Chronic respiratory failure with hypercapnia: Secondary | ICD-10-CM | POA: Diagnosis not present

## 2015-08-25 DIAGNOSIS — J9601 Acute respiratory failure with hypoxia: Secondary | ICD-10-CM | POA: Diagnosis not present

## 2015-08-25 DIAGNOSIS — G894 Chronic pain syndrome: Secondary | ICD-10-CM | POA: Diagnosis not present

## 2015-08-25 DIAGNOSIS — M961 Postlaminectomy syndrome, not elsewhere classified: Secondary | ICD-10-CM | POA: Diagnosis not present

## 2015-08-25 DIAGNOSIS — C342 Malignant neoplasm of middle lobe, bronchus or lung: Secondary | ICD-10-CM | POA: Diagnosis not present

## 2015-08-29 ENCOUNTER — Ambulatory Visit (HOSPITAL_COMMUNITY)
Admission: RE | Admit: 2015-08-29 | Discharge: 2015-08-29 | Disposition: A | Discharge status: Home / Self Care | Payer: Commercial Managed Care - HMO | Source: Ambulatory Visit | Attending: Urology | Admitting: Urology

## 2015-08-29 DIAGNOSIS — R0989 Other specified symptoms and signs involving the circulatory and respiratory systems: Secondary | ICD-10-CM | POA: Insufficient documentation

## 2015-08-29 DIAGNOSIS — I10 Essential (primary) hypertension: Secondary | ICD-10-CM | POA: Insufficient documentation

## 2015-08-29 DIAGNOSIS — E119 Type 2 diabetes mellitus without complications: Secondary | ICD-10-CM | POA: Diagnosis not present

## 2015-08-29 DIAGNOSIS — E785 Hyperlipidemia, unspecified: Secondary | ICD-10-CM | POA: Insufficient documentation

## 2015-08-29 DIAGNOSIS — I6523 Occlusion and stenosis of bilateral carotid arteries: Secondary | ICD-10-CM | POA: Diagnosis not present

## 2015-08-30 ENCOUNTER — Ambulatory Visit: Payer: Commercial Managed Care - HMO | Admitting: Physician Assistant

## 2015-08-30 NOTE — Telephone Encounter (Signed)
Pt is aware ok with dr Shawna Orleans if dr Ronnald Ramp will accept

## 2015-09-03 DIAGNOSIS — J449 Chronic obstructive pulmonary disease, unspecified: Secondary | ICD-10-CM | POA: Diagnosis not present

## 2015-09-04 NOTE — Telephone Encounter (Signed)
Ok with me 

## 2015-09-04 NOTE — Telephone Encounter (Signed)
lmovm to schedule appt

## 2015-09-04 NOTE — Telephone Encounter (Signed)
Dr. Ronnald Ramp, just following up on the encounters below. Please advise.

## 2015-09-06 ENCOUNTER — Other Ambulatory Visit (HOSPITAL_BASED_OUTPATIENT_CLINIC_OR_DEPARTMENT_OTHER): Payer: Commercial Managed Care - HMO

## 2015-09-06 ENCOUNTER — Encounter (HOSPITAL_COMMUNITY): Payer: Self-pay

## 2015-09-06 ENCOUNTER — Ambulatory Visit (HOSPITAL_COMMUNITY)
Admission: RE | Admit: 2015-09-06 | Discharge: 2015-09-06 | Disposition: A | Discharge status: Home / Self Care | Payer: Commercial Managed Care - HMO | Source: Ambulatory Visit | Attending: Internal Medicine | Admitting: Internal Medicine

## 2015-09-06 DIAGNOSIS — I2510 Atherosclerotic heart disease of native coronary artery without angina pectoris: Secondary | ICD-10-CM | POA: Insufficient documentation

## 2015-09-06 DIAGNOSIS — R911 Solitary pulmonary nodule: Secondary | ICD-10-CM | POA: Diagnosis not present

## 2015-09-06 DIAGNOSIS — R590 Localized enlarged lymph nodes: Secondary | ICD-10-CM | POA: Diagnosis not present

## 2015-09-06 DIAGNOSIS — M954 Acquired deformity of chest and rib: Secondary | ICD-10-CM | POA: Insufficient documentation

## 2015-09-06 DIAGNOSIS — J449 Chronic obstructive pulmonary disease, unspecified: Secondary | ICD-10-CM | POA: Diagnosis not present

## 2015-09-06 DIAGNOSIS — J432 Centrilobular emphysema: Secondary | ICD-10-CM | POA: Insufficient documentation

## 2015-09-06 DIAGNOSIS — C3492 Malignant neoplasm of unspecified part of left bronchus or lung: Secondary | ICD-10-CM

## 2015-09-06 DIAGNOSIS — R0602 Shortness of breath: Secondary | ICD-10-CM | POA: Diagnosis not present

## 2015-09-06 LAB — COMPREHENSIVE METABOLIC PANEL (CC13)
ALBUMIN: 3.5 g/dL (ref 3.5–5.0)
ALK PHOS: 134 U/L (ref 40–150)
ALT: 19 U/L (ref 0–55)
ANION GAP: 8 meq/L (ref 3–11)
AST: 19 U/L (ref 5–34)
BUN: 12.5 mg/dL (ref 7.0–26.0)
CALCIUM: 9.4 mg/dL (ref 8.4–10.4)
CHLORIDE: 101 meq/L (ref 98–109)
CO2: 32 mEq/L — ABNORMAL HIGH (ref 22–29)
CREATININE: 1 mg/dL (ref 0.7–1.3)
EGFR: 81 mL/min/{1.73_m2} — ABNORMAL LOW (ref >90)
Glucose: 141 mg/dl — ABNORMAL HIGH (ref 70–140)
POTASSIUM: 4 meq/L (ref 3.5–5.1)
Sodium: 142 mEq/L (ref 136–145)
Total Bilirubin: 0.41 mg/dL (ref 0.20–1.20)
Total Protein: 6.4 g/dL (ref 6.4–8.3)

## 2015-09-06 LAB — CBC WITH DIFFERENTIAL/PLATELET
BASO%: 0.8 % (ref 0.0–2.0)
Basophils Absolute: 0.1 10*3/uL (ref 0.0–0.1)
EOS ABS: 0.2 10*3/uL (ref 0.0–0.5)
EOS%: 2.9 % (ref 0.0–7.0)
HCT: 41 % (ref 38.4–49.9)
HEMOGLOBIN: 13.5 g/dL (ref 13.0–17.1)
LYMPH%: 12 % — AB (ref 14.0–49.0)
MCH: 29.3 pg (ref 27.2–33.4)
MCHC: 32.8 g/dL (ref 32.0–36.0)
MCV: 89.1 fL (ref 79.3–98.0)
MONO#: 0.6 10*3/uL (ref 0.1–0.9)
MONO%: 7.4 % (ref 0.0–14.0)
NEUT%: 76.9 % — ABNORMAL HIGH (ref 39.0–75.0)
NEUTROS ABS: 5.7 10*3/uL (ref 1.5–6.5)
Platelets: 97 10*3/uL — ABNORMAL LOW (ref 140–400)
RBC: 4.61 10*6/uL (ref 4.20–5.82)
RDW: 15.4 % — AB (ref 11.0–14.6)
WBC: 7.4 10*3/uL (ref 4.0–10.3)
lymph#: 0.9 10*3/uL (ref 0.9–3.3)

## 2015-09-06 MED ORDER — IOHEXOL 300 MG/ML  SOLN
75.0000 mL | Freq: Once | INTRAMUSCULAR | Status: AC | PRN
  Administered 2015-09-06: 75 mL via INTRAVENOUS

## 2015-09-07 NOTE — Telephone Encounter (Signed)
appt set

## 2015-09-13 ENCOUNTER — Ambulatory Visit (HOSPITAL_BASED_OUTPATIENT_CLINIC_OR_DEPARTMENT_OTHER): Payer: Commercial Managed Care - HMO | Admitting: Internal Medicine

## 2015-09-13 ENCOUNTER — Encounter: Payer: Self-pay | Admitting: Internal Medicine

## 2015-09-13 ENCOUNTER — Telehealth: Payer: Self-pay | Admitting: Internal Medicine

## 2015-09-13 VITALS — BP 172/62 | HR 58 | Temp 98.4°F | Resp 21 | Ht 66.0 in | Wt 211.9 lb

## 2015-09-13 DIAGNOSIS — Z720 Tobacco use: Secondary | ICD-10-CM | POA: Diagnosis not present

## 2015-09-13 DIAGNOSIS — F17200 Nicotine dependence, unspecified, uncomplicated: Secondary | ICD-10-CM

## 2015-09-13 DIAGNOSIS — I10 Essential (primary) hypertension: Secondary | ICD-10-CM

## 2015-09-13 DIAGNOSIS — C3492 Malignant neoplasm of unspecified part of left bronchus or lung: Secondary | ICD-10-CM | POA: Diagnosis not present

## 2015-09-13 DIAGNOSIS — R634 Abnormal weight loss: Secondary | ICD-10-CM | POA: Diagnosis not present

## 2015-09-13 NOTE — Progress Notes (Signed)
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  PRIOR THERAPY:  1) Concurrent chemoradiation with weekly carboplatin for AUC of 2 and paclitaxel 45 mg/M2, status post 7 cycles, last dose was given 12/20/2013. First dose on 11/01/2013. 2) Consolidation chemotherapy with carboplatin for AUC of 5 and paclitaxel 175 mg/M2 every 3 weeks with Neulasta support. First cycle on 02/08/2014. Status post 3 cycles and carboplatin was discontinued secondary to allergic reaction.    CURRENT THERAPY: Observation  CHEMOTHERAPY INTENT: Curative/control  CURRENT # OF CHEMOTHERAPY CYCLES: 0  CURRENT ANTIEMETICS: Zofran, dexamethasone and Compazine  CURRENT SMOKING STATUS: Former smoker  ORAL CHEMOTHERAPY AND CONSENT: None  CURRENT BISPHOSPHONATES USE: None  PAIN MANAGEMENT: 0/10  NARCOTICS INDUCED CONSTIPATION: None.  LIVING WILL AND CODE STATUS: Full code.   INTERVAL HISTORY: Ernest [name] 59 y.o. male returns to the clinic today for three-month followup visit. The patient continues to complain of shortness of breath with exertion, likely to significant weight gain recently. He denied having any significant chest pain or hemoptysis. He denied having any nausea or vomiting, no fever or chills. He is very anxious and worried about his CT scan results. His blood pressure is elevated today but usually normal at home. The patient had repeat CT scan of the chest performed recently and he is here for evaluation and discussion of his scan results.  MEDICAL HISTORY: Past Medical History  Diagnosis Date  . CAD (coronary artery disease)     Left Main 30% stenosis, LAD 20 - 30 %  stenosis, first and second diagonal branchesat 40 - 50%  stenosis with small arteries, circumflex had 30% stenosis in the large obtuse marginal, RCA at 70 - 80%  stenosis [not felt to be occlusive after evaluation with flow wire], distal 50 - 60% stenosis - [name][  . COPD (chronic obstructive pulmonary disease) (HCC)     Dr. Baird Lyons  . Depression   . Anxiety   . Hyperlipidemia   . Chronic insomnia   . Gout   . GERD (gastroesophageal reflux disease)   . PVD (peripheral vascular disease) (HCC)     (ABI 0.9 on right and 0.89 on left)  severe left external iliac stenosis.  He  had successful stenting of his left external iliac per Dr. Burt Knack.  . DDD (degenerative disc disease)   . Hx of colonoscopy   . COPD with asthma ([facility]) 09/08/2007  . OSA (obstructive sleep apnea)     NPSG 09/10/10- AHI 11.3/hr  . Hypertension     dr Percival Spanish  . History of radiation therapy 11/10/13- 12/29/13    left lung 6600 cGy in 33 sessions  . Diabetes mellitus without complication ([facility]) 03/19/9562  . Cancer ([facility])     lung  . Lung cancer ([facility]) 10/04/13    LUL squamous cell lung cancer  . History of cardiovascular stress test     Myoview 7/16:  Diaphragmatic attenuation, no ischemia, EF 56%; Low Risk    ALLERGIES:  is allergic to carboplatin.  MEDICATIONS:  Current Outpatient Prescriptions  Medication Sig Dispense Refill  . albuterol (PROVENTIL) (2.5 MG/3ML) 0.083% nebulizer solution INHALE 1 VIAL IN NEBULIZER EVERY 4 HOURS AS NEEDED FOR WHEEZING OR SHORTNESS OF  BREATH 300 mL 11  . ALPRAZolam (XANAX) 0.25 MG tablet Take 0.25 mg by mouth daily as needed for anxiety.     . ARIPiprazole (ABILIFY) 5 MG tablet Take 1 tablet (5 mg total) by mouth at bedtime.    . Armodafinil (NUVIGIL) 150 MG tablet Take 150 mg by mouth every morning.     [name] aspirin 81 MG EC tablet TAKE 1 TABLET EVERY DAY 30 tablet 5  . atorvastatin (LIPITOR) 20 MG tablet TAKE 1 TABLET EVERY DAY 90 tablet 3  . BD PEN NEEDLE NANO U/F  32G X 4 MM MISC 1 EACH BY DOES NOT APPLY ROUTE DAILY. 100 each 3  . clonazePAM (KLONOPIN) 1 MG tablet Take 2 mg by mouth at bedtime as needed for anxiety.     . clopidogrel (PLAVIX) 75 MG tablet Take 1 tablet (75 mg total) by mouth daily. 90 tablet 3  . escitalopram (LEXAPRO) 20 MG tablet Take 1 tablet (20 mg total) by mouth daily. 90 tablet 0  . esomeprazole (NEXIUM) 40 MG capsule Take 40 mg by mouth every morning.     . finasteride (PROSCAR) 5 MG tablet Take 1 tablet (5 mg total) by mouth daily. 30 tablet 3  . FLUoxetine (PROZAC) 40 MG capsule Take 40 mg by mouth every morning.    . Fluticasone-Salmeterol (ADVAIR DISKUS) 500-50 MCG/DOSE AEPB 1 puff then rinse mouth, twice daily maintenance 60 each 11  . gabapentin (NEURONTIN) 100 MG capsule Take 100 mg by mouth daily. Take 1 cap daily    . Insulin Glargine (LANTUS SOLOSTAR) 100 UNIT/ML Solostar Pen 5 units at bedtime. 5 pen PRN  . isosorbide mononitrate (IMDUR) 60 MG 24 hr tablet Take 1 tablet (60 mg total) by mouth daily. 90 tablet 3  . metFORMIN (GLUCOPHAGE-XR) 500 MG 24 hr tablet Take 2 tablets (1,000 mg total) by mouth daily with breakfast. 180 tablet 1  . methocarbamol (ROBAXIN) 500 MG tablet Take 500 mg by mouth 2 (two) times daily as needed for muscle spasms.     . metoprolol succinate (TOPROL-XL) 25 MG 24 hr tablet TAKE 1/2 TABLET BY MOUTH DAILY 30 tablet 3  . mirtazapine (REMERON) 45 MG tablet Take 1 tablet (45 mg total) by mouth at bedtime.    [name] morphine (MS CONTIN) 30 MG 12 hr tablet Take 1 tablet (30 mg total) by mouth every 12 (twelve) hours.    [name] morphine (MSIR) 15 MG tablet Take 1 tablet (15 mg total) by mouth every 4 (four) hours as needed for severe pain. 30 tablet 0  . nitroGLYCERIN (NITROSTAT) 0.4 MG SL tablet Place 1 tablet (0.4 mg total) under the tongue every 5 (five) minutes as needed for chest pain. 25 tablet 3  . nystatin (MYCOSTATIN) 100000 UNIT/ML suspension Take 5 mLs (500,000 Units total) by mouth 4 (four) times  daily. (Patient taking differently: Take 5 mLs by mouth 4 (four) times daily as needed. ) 60 mL 1  . oxyCODONE-acetaminophen (PERCOCET) 10-325 MG tablet Take 1 tablet by mouth every 8 (eight) hours as needed. pain    . polyethylene glycol powder (GLYCOLAX/MIRALAX) powder Take as directed for colonoscopy. 255 g 0  . potassium chloride SA (K-DUR,KLOR-CON) 20 MEQ tablet Take 20 mEq by mouth every morning.     . prochlorperazine (COMPAZINE) 10 MG tablet TAKE 1 TABLET BY MOUTH EVERY 6 HOURS AS NEEDED FOR NAUSEA AND VOMITING 60 tablet 3  . STIOLTO RESPIMAT 2.5-2.5 MCG/ACT AERS INHALE 2 PUFFS INTO THE LUNGS DAILY.  5  .  Suvorexant (BELSOMRA) 10 MG TABS Take 10 mg by mouth at bedtime.    . tamsulosin (FLOMAX) 0.4 MG CAPS capsule Take 1 capsule (0.4 mg total) by mouth daily. 30 capsule 3  . theophylline (THEO-24) 400 MG 24 hr capsule Take 1 capsule (400 mg total) by mouth daily. 30 capsule 5  . VENTOLIN HFA 108 (90 BASE) MCG/ACT inhaler INHALE 2 PUFFS INTO THE LUNGS 4 TIMES DAILY AS NEEDED FOR WHEEZING 18 Inhaler 11  . VOLTAREN 1 % GEL Apply 2 g topically daily as needed (for pain).      No current facility-administered medications for this visit.   Facility-Administered Medications Ordered in Other Visits  Medication Dose Route Frequency Provider Last Rate Last Dose  . DOBUTamine (DOBUTREX) 1,000 mcg/mL in dextrose 5% 250 mL infusion  0-40 mcg/kg/min Intravenous Continuous Fay Records, MD 224.2 mL/hr at 06/12/15 1117 40 mcg/kg/min at 06/12/15 1117    SURGICAL HISTORY:  Past Surgical History  Procedure Laterality Date  . Spinal fusion  03/05/2007    L4-L5  . Hip surgery      left 'bone graft taken"  . Arm surgery      left elbow  . Shoulder surgery      right  . C-spine surgery    . Angioplasty    . Video bronchoscopy with endobronchial navigation N/A 10/04/2013    Procedure: VIDEO BRONCHOSCOPY WITH ENDOBRONCHIAL NAVIGATION;  Surgeon: Collene Gobble, MD;  Location: [facility];  Service: Thoracic;   Laterality: N/A;  . Back surgery      lower  . Anterior fusion cervical spine      cervical fusion  7 yrs ago ([facility])  . Colon surgery      '11 "Diverticulitis"  . Colonoscopy with propofol N/A 05/22/2015    Procedure: COLONOSCOPY WITH PROPOFOL;  Surgeon: Inda Castle, MD;  Location: [facility];  Service: Endoscopy;  Laterality: N/A;  . Peripheral vascular catheterization N/A 08/10/2015    Procedure: Lower Extremity Angiography;  Surgeon: Lorretta Harp, MD;  Location: [facility];  Service: Cardiovascular;  Laterality: N/A;    REVIEW OF SYSTEMS:  A comprehensive review of systems was negative except for: Respiratory: positive for dyspnea on exertion   PHYSICAL EXAMINATION: General appearance: alert, cooperative and no distress Head: Normocephalic, without obvious abnormality, atraumatic Neck: no adenopathy, no JVD, supple, symmetrical, trachea midline and thyroid not enlarged, symmetric, no tenderness/mass/nodules Lymph nodes: Cervical, supraclavicular, and axillary nodes normal. Resp: wheezes bilaterally Back: symmetric, no curvature. ROM normal. No CVA tenderness. Cardio: regular rate and rhythm, S1, S2 normal, no murmur, click, rub or gallop GI: soft, non-tender; bowel sounds normal; no masses,  no organomegaly Extremities: extremities normal, atraumatic, no cyanosis or edema Neurologic: Alert and oriented X 3, normal strength and tone. Normal symmetric reflexes. Normal coordination and gait  ECOG PERFORMANCE STATUS: 1 - Symptomatic but completely ambulatory  Blood pressure 172/62, pulse 58, temperature 98.4 F (36.9 C), temperature source Oral, resp. rate 21, height '5\' 6"'$  (1.676 m), weight 211 lb 14.4 oz (96.117 kg), SpO2 94 %.  LABORATORY DATA: Lab Results  Component Value Date   WBC 7.4 09/06/2015   HGB 13.5 09/06/2015   HCT 41.0 09/06/2015   MCV 89.1 09/06/2015   PLT 97* 09/06/2015      Chemistry      Component Value Date/Time   NA 142 09/06/2015 0849    NA 138 08/11/2015 0358   K 4.0 09/06/2015 0849   K 4.0 08/11/2015 0358  CL 101 08/11/2015 0358   CO2 32* 09/06/2015 0849   CO2 29 08/11/2015 0358   BUN 12.5 09/06/2015 0849   BUN 7 08/11/2015 0358   CREATININE 1.0 09/06/2015 0849   CREATININE 0.82 08/11/2015 0358   CREATININE 0.80 08/07/2015 0959      Component Value Date/Time   CALCIUM 9.4 09/06/2015 0849   CALCIUM 9.0 08/11/2015 0358   ALKPHOS 134 09/06/2015 0849   ALKPHOS 125* 07/18/2014 1558   AST 19 09/06/2015 0849   AST 21 07/18/2014 1558   ALT 19 09/06/2015 0849   ALT 16 07/18/2014 1558   BILITOT 0.41 09/06/2015 0849   BILITOT 0.3 07/18/2014 1558       RADIOGRAPHIC STUDIES: Ct Chest W Contrast  09/06/2015  CLINICAL DATA:  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 COPD. Shortness of breath on home oxygen. EXAM: CT CHEST WITH CONTRAST TECHNIQUE: Multidetector CT imaging of the chest was performed during intravenous contrast administration. CONTRAST:  97m OMNIPAQUE IOHEXOL 300 MG/ML  SOLN COMPARISON:  04/14/2015 chest CT. FINDINGS: Mediastinum/Nodes: Normal heart size. No pericardial fluid/thickening. There is atherosclerosis of the thoracic aorta, the great vessels of the mediastinum and the coronary arteries, including calcified atherosclerotic plaque in the left anterior descending, left circumflex and right coronary arteries. Stable dilated main pulmonary artery (3.2 cm diameter). Atherosclerotic nonaneurysmal thoracic aorta. No central pulmonary emboli. Normal visualized thyroid. Normal esophagus. No axillary lymphadenopathy. Stable mildly enlarged 1.2 cm subcarinal node (series 2/image 30). No additional pathologically enlarged mediastinal nodes. No right hilar adenopathy. Lungs/Pleura: No pneumothorax. No pleural effusion. Mild centrilobular emphysema. Previously visualized patchy consolidation and ground-glass opacity in the right upper  lobe and basilar left lower lobe has resolved. Right upper lobe 3 mm pulmonary nodule (series 5/image 20), not previously visualized. Right upper lobe ground-glass 4 mm pulmonary nodule (5/23), not previously visualized. Posterior right lower lobe 5 mm ground-glass pulmonary nodule (5/37), increased from 2 mm on 04/14/2015. Stable 2 mm right lower lobe pulmonary nodule (5/45). Ground-glass 4 mm left upper lobe pulmonary nodule (5/20), stable. Several left upper lobe 3 mm solid pulmonary nodule (5/30), stable. Subpleural left lower lobe 5 mm pulmonary nodule (5/42), stable since 01/26/2015. There is stable sharply marginated consolidation with volume loss, surrounding distortion, internal air bronchograms and internal cavitation in the central left upper lobe and superior segment left lower lobe, in keeping with stable post treatment change, with no new solid component or mass to suggest local recurrence. 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 Musculoskeletal: No aggressive appearing focal osseous lesions. 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 There is a healed deformity in the anterior left second rib. Partially visualized is surgical plate with interlocking screws overlying the lower cervical spine. Partially visualized is bilateral posterior spinal fusion hardware in the lumbar spine. Mild degenerative changes are seen in the thoracic spine. IMPRESSION: 1. Stable post treatment change in the central left upper lung, with no evidence of local tumor recurrence. 2. Interval resolution of previously described inflammatory opacity in the right upper and left lower lobes. 3. Mild interval growth of an  indeterminate right lower lobe 5 mm ground-glass pulmonary nodule, metastasis not excluded. Two new tiny right upper lobe  pulmonary nodules, indeterminate, possibly residual inflammatory foci given the recent infection in this location. 4. Stable mild subcarinal mediastinal lymphadenopathy. 5. Mild centrilobular emphysema. 6. Stable dilated main pulmonary artery suggesting chronic pulmonary arterial hypertension. 7.  Atherosclerosis, including three-vessel coronary artery disease. 8. Healed anterior left second rib deformity. 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 Electronically Signed   By: Ilona Sorrel M.D.   On: 09/06/2015 12:05    ASSESSMENT AND PLAN: This is a very pleasant 59 years old white male with stage IIIA non-small cell lung cancer, squamous cell carcinoma currently undergoing concurrent chemoradiation with weekly carboplatin and paclitaxel status post 7 weeks of treatment. He tolerated his treatment fairly well with no significant adverse effects. This was followed by consolidation chemotherapy with carboplatin and paclitaxel status post 3 cycles, .carboplatin was discontinued after cycle #2 secondary to hypersensitivity reaction. The recent CT scan of the chest and neck showed no evidence for disease progression, but there was indeterminate small pulmonary nodules that need close observation. I discussed the scan results with the patient. I recommended for him to continue on observation with repeat CT scan of the chest in 4 months. For smoke cessation, I strongly encouraged the patient to quit smoking and offered him a smoke cessation program. For the weight loss, I strongly encouraged the patient to exercise at regular basis and also to eat healthy diet. For hypertension, I strongly advised the patient to check his blood pressure regularly at home and if still elevated to consult with his primary care physician for  treatment adjustment. He was advised to call immediately if he has any concerning symptoms in the interval. The patient voices understanding of current disease status and treatment options and is in agreement with the current care plan.  All questions were answered. The patient knows to call the clinic with any problems, questions or concerns. We can certainly see the patient much sooner if necessary.  Disclaimer: This note was dictated with voice recognition software. Similar sounding words can inadvertently be transcribed and may not be corrected upon review.

## 2015-09-13 NOTE — Telephone Encounter (Signed)
per pof to sch pt appt-gave pt copy of avs-adv pt [facility] will call to sch scan

## 2015-09-14 ENCOUNTER — Other Ambulatory Visit: Payer: Self-pay | Admitting: Internal Medicine

## 2015-09-15 ENCOUNTER — Telehealth: Payer: Self-pay

## 2015-09-15 DIAGNOSIS — I158 Other secondary hypertension: Secondary | ICD-10-CM

## 2015-09-15 NOTE — Telephone Encounter (Signed)
Orders placed for 08/2017 repeat Carotids

## 2015-09-18 ENCOUNTER — Ambulatory Visit (INDEPENDENT_AMBULATORY_CARE_PROVIDER_SITE_OTHER): Payer: Commercial Managed Care - HMO | Admitting: Physician Assistant

## 2015-09-18 ENCOUNTER — Encounter: Payer: Self-pay | Admitting: Physician Assistant

## 2015-09-18 VITALS — BP 132/70 | HR 66 | Ht 66.0 in | Wt 209.2 lb

## 2015-09-18 DIAGNOSIS — J449 Chronic obstructive pulmonary disease, unspecified: Secondary | ICD-10-CM | POA: Diagnosis not present

## 2015-09-18 DIAGNOSIS — I739 Peripheral vascular disease, unspecified: Secondary | ICD-10-CM

## 2015-09-18 NOTE — Patient Instructions (Signed)
Continue same medications   Your physician recommends that you schedule a follow-up appointment in: December with Dr.Hochrein   Follow up with Dr.Berry as needed

## 2015-09-18 NOTE — Progress Notes (Signed)
Cardiology Office Note   Date:  09/18/2015   ID:  [name], DOB Oct 18, 1956, MRN [id number]  PCP:  Drema Pry, DO  Cardiologist:  Dr Percival Spanish, PV: Dr Stacy Gardner, PA-C   Chief Complaint  Patient presents with  . Follow-up    POST CATH    History of Present Illness: Ernest [name] is a 59 y.o. male with a history of mod CAD by cath, COPD, HL, GERD, PAD w/ L-EIA stent '09, remote tobacco, [place] lung CA s/p chemo/XRT, HTN, DM.  He had a PV cath w/ stent R-ICA for CTO 08/10/2015 after ABIs showed R 0.36 & L 0.91.   Ernest [name] presents for post-hospital followup.   Since discharge from the hospital, he has done well. He is trying to increase his activity and is walking more. He is not getting the pain in his legs that he was getting. His cath sites have healed well.   His breathing is at baseline, has improved some since quitting tobacco. He is coughing, productive of white phlegm at times, but that has improved.  Past Medical History  Diagnosis Date  . CAD (coronary artery disease)     Left Main 30% stenosis, LAD 20 - 30 % stenosis, first and second diagonal branchesat 40 - 50%  stenosis with small arteries, circumflex had 30% stenosis in the large obtuse marginal, RCA at 70 - 80%  stenosis [not felt to be occlusive after evaluation with flow wire], distal 50 - 60% stenosis - Ernest [name][  . COPD (chronic obstructive pulmonary disease) (HCC)     Dr. Baird Lyons  . Depression   . Anxiety   . Hyperlipidemia   . Chronic insomnia   . Gout   . GERD (gastroesophageal reflux disease)   . PVD (peripheral vascular disease) (HCC)     (ABI 0.9 on right and 0.89 on left)  severe left external iliac stenosis.  He  had successful stenting of his left external iliac per Dr. Burt Knack.07/2015 overlapping ICast stents to the R-CIA  . DDD (degenerative disc disease)   . Hx of colonoscopy   . COPD with asthma ([place]) 09/08/2007  . OSA (obstructive sleep  apnea)     NPSG 09/10/10- AHI 11.3/hr  . Hypertension     dr Percival Spanish  . History of radiation therapy 11/10/13- 12/29/13    left lung 6600 cGy in 33 sessions  . Diabetes mellitus without complication ([place]) 05/27/5008  . Cancer ([place])     lung  . Lung cancer ([place]) 10/04/13    LUL squamous cell lung cancer  . History of cardiovascular stress test     Myoview 7/16:  Diaphragmatic attenuation, no ischemia, EF 56%; Low Risk    Past Surgical History  Procedure Laterality Date  . Spinal fusion  03/05/2007    L4-L5  . Hip surgery      left 'bone graft taken"  . Arm surgery      left elbow  . Shoulder surgery      right  . C-spine surgery    . Angioplasty    . Video bronchoscopy with endobronchial navigation N/A 10/04/2013    Procedure: VIDEO BRONCHOSCOPY WITH ENDOBRONCHIAL NAVIGATION;  Surgeon: Collene Gobble, MD;  Location: [facility];  Service: Thoracic;  Laterality: N/A;  . Back surgery      lower  . Anterior fusion cervical spine      cervical fusion  7 yrs ago ([facility])  . Colon surgery      '  11 "Diverticulitis"  . Colonoscopy with propofol N/A 05/22/2015    Procedure: COLONOSCOPY WITH PROPOFOL;  Surgeon: Inda Castle, MD;  Location: [facility];  Service: Endoscopy;  Laterality: N/A;  . Peripheral vascular catheterization N/A 08/10/2015    Procedure: Lower Extremity Angiography;  Surgeon: Lorretta Harp, MD; Distal Ao OK, L-EIA stent OK, R-CIA 100% s/p overlapping 7 mm x 38 mm ICast stents, 50-60% R-CFA        Current Outpatient Prescriptions  Medication Sig Dispense Refill  . albuterol (PROVENTIL) (2.5 MG/3ML) 0.083% nebulizer solution INHALE 1 VIAL IN NEBULIZER EVERY 4 HOURS AS NEEDED FOR WHEEZING OR SHORTNESS OF BREATH 300 mL 11  . ALPRAZolam (XANAX) 0.25 MG tablet Take 0.25 mg by mouth daily as needed for anxiety.     . ARIPiprazole (ABILIFY) 5 MG tablet Take 1 tablet (5 mg total) by mouth at bedtime.    . Armodafinil (NUVIGIL) 150 MG tablet Take 150 mg by mouth every morning.      [name] aspirin 81 MG EC tablet TAKE 1 TABLET EVERY DAY 30 tablet 5  . atorvastatin (LIPITOR) 20 MG tablet TAKE 1 TABLET EVERY DAY 90 tablet 3  . BD PEN NEEDLE NANO U/F 32G X 4 MM MISC 1 EACH BY DOES NOT APPLY ROUTE DAILY. 100 each 3  . clonazePAM (KLONOPIN) 1 MG tablet Take 2 mg by mouth at bedtime as needed for anxiety.     . clopidogrel (PLAVIX) 75 MG tablet Take 1 tablet (75 mg total) by mouth daily. 90 tablet 3  . esomeprazole (NEXIUM) 40 MG capsule Take 40 mg by mouth every morning.     . finasteride (PROSCAR) 5 MG tablet Take 1 tablet (5 mg total) by mouth daily. 30 tablet 3  . FLUoxetine (PROZAC) 40 MG capsule Take 40 mg by mouth every morning.    . Fluticasone-Salmeterol (ADVAIR DISKUS) 500-50 MCG/DOSE AEPB 1 puff then rinse mouth, twice daily maintenance 60 each 11  . gabapentin (NEURONTIN) 100 MG capsule Take 100 mg by mouth daily. Take 1 cap daily    . Insulin Glargine (LANTUS SOLOSTAR) 100 UNIT/ML Solostar Pen 5 units at bedtime. 5 pen PRN  . isosorbide mononitrate (IMDUR) 60 MG 24 hr tablet Take 1 tablet (60 mg total) by mouth daily. 90 tablet 3  . metFORMIN (GLUCOPHAGE-XR) 500 MG 24 hr tablet Take 2 tablets (1,000 mg total) by mouth daily with breakfast. 180 tablet 1  . methocarbamol (ROBAXIN) 500 MG tablet Take 500 mg by mouth 2 (two) times daily as needed for muscle spasms.     . metoprolol succinate (TOPROL-XL) 25 MG 24 hr tablet TAKE 1/2 TABLET BY MOUTH DAILY 30 tablet 3  . mirtazapine (REMERON) 45 MG tablet Take 1 tablet (45 mg total) by mouth at bedtime.    [name] morphine (MS CONTIN) 30 MG 12 hr tablet Take 1 tablet (30 mg total) by mouth every 12 (twelve) hours.    [name] morphine (MSIR) 15 MG tablet Take 1 tablet (15 mg total) by mouth every 4 (four) hours as needed for severe pain. 30 tablet 0  . nitroGLYCERIN (NITROSTAT) 0.4 MG SL tablet Place 1 tablet (0.4 mg total) under the tongue every 5 (five) minutes as needed for chest pain. (Patient not taking: Reported on 09/13/2015) 25  tablet 3  . nystatin (MYCOSTATIN) 100000 UNIT/ML suspension Take 5 mLs (500,000 Units total) by mouth 4 (four) times daily. (Patient taking differently: Take 5 mLs by mouth 4 (four) times daily as needed. ) 60  mL 1  . oxyCODONE-acetaminophen (PERCOCET) 10-325 MG tablet Take 1 tablet by mouth every 8 (eight) hours as needed. pain    . polyethylene glycol powder (GLYCOLAX/MIRALAX) powder Take as directed for colonoscopy. 255 g 0  . potassium chloride SA (K-DUR,KLOR-CON) 20 MEQ tablet Take 20 mEq by mouth every morning.     . prochlorperazine (COMPAZINE) 10 MG tablet TAKE 1 TABLET BY MOUTH EVERY 6 HOURS AS NEEDED FOR NAUSEA AND VOMITING (Patient not taking: Reported on 09/13/2015) 60 tablet 3  . STIOLTO RESPIMAT 2.5-2.5 MCG/ACT AERS INHALE 2 PUFFS INTO THE LUNGS DAILY.  5  . Suvorexant (BELSOMRA) 10 MG TABS Take 10 mg by mouth at bedtime.    . tamsulosin (FLOMAX) 0.4 MG CAPS capsule Take 1 capsule (0.4 mg total) by mouth daily. 30 capsule 3  . theophylline (THEO-24) 400 MG 24 hr capsule Take 1 capsule (400 mg total) by mouth daily. 30 capsule 5  . VENTOLIN HFA 108 (90 BASE) MCG/ACT inhaler INHALE 2 PUFFS INTO THE LUNGS 4 TIMES DAILY AS NEEDED FOR WHEEZING 18 Inhaler 11  . VOLTAREN 1 % GEL Apply 2 g topically daily as needed (for pain).      No current facility-administered medications for this visit.   Facility-Administered Medications Ordered in Other Visits  Medication Dose Route Frequency Provider Last Rate Last Dose  . DOBUTamine (DOBUTREX) 1,000 mcg/mL in dextrose 5% 250 mL infusion  0-40 mcg/kg/min Intravenous Continuous Fay Records, MD 224.2 mL/hr at 06/12/15 1117 40 mcg/kg/min at 06/12/15 1117    Allergies:   Carboplatin    Social History:  The patient  reports that he quit smoking about 1 years ago. His smoking use included Cigarettes. He has a 21.5 pack-year smoking history. He has never used smokeless tobacco. He reports that he does not drink alcohol or use illicit drugs.   Family  History:  The patient's family history includes Cancer in his sister; Emphysema in his sister; Heart attack in his mother and sister; Hypertension in his brother; Lung cancer in his sister. There is no history of Stroke.    ROS:  Please see the history of present illness. All other systems are reviewed and negative.    PHYSICAL EXAM: VS:  BP 132/70 mmHg  Pulse 66  Ht '5\' 6"'$  (1.676 m)  Wt 209 lb 3.2 oz (94.892 kg)  BMI 33.78 kg/m2 , BMI Body mass index is 33.78 kg/(m^2). GEN: Well nourished, well developed, male in no acute distress HEENT: normal for age  Neck: no JVD, no carotid bruit, no masses Cardiac: RRR; no murmur, no rubs, or gallops Respiratory: Few Rales and slight exp wheeze, normal work of breathing, good air exchange GI: soft, nontender, nondistended, + BS MS: no deformity or atrophy; no edema; distal pulses are 2+ in all 4 extremities; Femoral cath sites well-healed, bilateral femoral bruits noted  Skin: warm and dry, no rash Neuro:  Strength and sensation are intact Psych: euthymic mood, full affect  EKG:  EKG is not ordered today.  MV 05/2015  Nuclear stress EF: 56%.  There was no ST segment deviation noted during stress.  Defect 1: There is a small defect of mild severity present in the basal inferior location that is fixed and consistent with diaphragmatic attenuation. No ischemia noted.  This is a low risk study.  The left ventricular ejection fraction is normal (55-65%).  PV CATH 08/10/2015 1: Abdominal aortogram-the distal dominant aorta was free of significant disease 2: Left lower extremity-the left external iliac  artery stent was widely patent. There was 30% left common femoral artery stenosis. 3: Right lower extremity-the right common iliac artery was occluded at its origin reconstituting just above the takeoff of the hypogastric artery. There was a 50-60% calcified stenosis in the distal right common femoral artery.  IMPRESSION:Mr. Mcchesney has an  occluded right common iliac artery. We will proceed with attempt at PTA and stenting using overlapping ICast covered stents.    S/P overlapping 7 mm x 38 mm long ICast covered stents from the origin of the common iliac artery down to the take off of the hypogastric.   Recent Labs: 04/11/2015: B Natriuretic Peptide 305.2*; Magnesium 1.8 08/07/2015: TSH 2.353 09/06/2015: ALT 19; BUN 12.5; Creatinine 1.0; HGB 13.5; Platelets 97*; Potassium 4.0; Sodium 142    Lipid Panel    Component Value Date/Time   CHOL 134 07/18/2014 1558   TRIG 417* 04/11/2015 0244   HDL 35.30* 07/18/2014 1558   CHOLHDL 4 07/18/2014 1558   VLDL 37.0 07/18/2014 1558   LDLCALC 62 07/18/2014 1558   LDLDIRECT 71.4 09/08/2012 0959     Wt Readings from Last 3 Encounters:  09/18/15 209 lb 3.2 oz (94.892 kg)  09/13/15 211 lb 14.4 oz (96.117 kg)  08/11/15 197 lb 8.5 oz (89.6 kg)     Other studies Reviewed: Additional studies/ records that were reviewed today include: Hospital and office records.  ASSESSMENT AND PLAN:  1.  PAD: s/p stenting to the R-ICA Post-stent Dopplers: R-CIA OK, Bilat EIA > 50%, L-CFA & L-SFA 30-49%  Current medicines are reviewed at length with the patient today.  The patient does not have concerns regarding medicines.  The following changes have been made:  no change  Labs/ tests ordered today include:  No orders of the defined types were placed in this encounter.   Disposition:   FU with Dr Percival Spanish as scheduled and with Dr Gwenlyn Found as needed.  Augusto Garbe  09/18/2015 [facility] [address], [place], [place]  [address] Phone: [phone]; Fax: [phone]

## 2015-09-19 ENCOUNTER — Other Ambulatory Visit: Payer: Self-pay | Admitting: Internal Medicine

## 2015-09-21 ENCOUNTER — Other Ambulatory Visit: Payer: Self-pay | Admitting: Internal Medicine

## 2015-09-22 ENCOUNTER — Ambulatory Visit (INDEPENDENT_AMBULATORY_CARE_PROVIDER_SITE_OTHER): Payer: Commercial Managed Care - HMO | Admitting: Internal Medicine

## 2015-09-22 ENCOUNTER — Telehealth: Payer: Self-pay | Admitting: Internal Medicine

## 2015-09-22 ENCOUNTER — Encounter: Payer: Self-pay | Admitting: Internal Medicine

## 2015-09-22 VITALS — BP 102/70 | HR 68 | Ht 66.0 in | Wt 206.4 lb

## 2015-09-22 DIAGNOSIS — Z720 Tobacco use: Secondary | ICD-10-CM

## 2015-09-22 DIAGNOSIS — J441 Chronic obstructive pulmonary disease with (acute) exacerbation: Secondary | ICD-10-CM | POA: Diagnosis not present

## 2015-09-22 DIAGNOSIS — J9611 Chronic respiratory failure with hypoxia: Secondary | ICD-10-CM | POA: Diagnosis not present

## 2015-09-22 DIAGNOSIS — J310 Chronic rhinitis: Secondary | ICD-10-CM | POA: Diagnosis not present

## 2015-09-22 MED ORDER — PREDNISONE 5 MG (21) PO TBPK: 5.0000 mg | ORAL_TABLET | Freq: Every day | ORAL | Status: DC

## 2015-09-22 MED ORDER — PHENYLEPHRINE HCL 1 % NA SOLN
3.0000 [drp] | Freq: Once | NASAL | Status: AC
  Administered 2015-09-22: 3 [drp] via NASAL

## 2015-09-22 MED ORDER — BENZONATATE 200 MG PO CAPS
200.0000 mg | ORAL_CAPSULE | Freq: Three times a day (TID) | ORAL | Status: DC | PRN
Start: 2015-09-22 — End: 2015-11-01

## 2015-09-22 MED ORDER — ARFORMOTEROL TARTRATE 15 MCG/2ML IN NEBU: 15.0000 ug | INHALATION_SOLUTION | Freq: Two times a day (BID) | RESPIRATORY_TRACT | Status: DC

## 2015-09-22 MED ORDER — METHYLPREDNISOLONE ACETATE 80 MG/ML IJ SUSP
80.0000 mg | Freq: Once | INTRAMUSCULAR | Status: AC
  Administered 2015-09-22: 80 mg via INTRAMUSCULAR

## 2015-09-22 NOTE — Patient Instructions (Signed)
Neb neo nasal                  Dx  Chronic rhinitis  Depo 80  Samples Brovana neb solution   # 4,   Try one by nebulizer twice daily instead of using albuterol    Ask your drug store about cost.   Scripts sent for benzonatate perles for cough, and for prednisone to try daily maintenance

## 2015-09-22 NOTE — Assessment & Plan Note (Signed)
Very limited exercise tolerance reflects severe lung disease, atherosclerosis, deconditioning Plan-Depo-Medrol then try maintenance prednisone 5 mg daily with discussion, benzonatate for cough

## 2015-09-22 NOTE — Assessment & Plan Note (Signed)
Continues to need oxygen with sleep and exertion

## 2015-09-22 NOTE — Assessment & Plan Note (Signed)
Has remained tobacco free since 2014 and now finds odor of tobacco smoke irritating

## 2015-09-22 NOTE — Telephone Encounter (Signed)
Called spoke with [facility], gave directions for prednisone. Nothing further needed

## 2015-09-22 NOTE — Progress Notes (Signed)
Patient ID: Ernest [name], male    DOB: 03-Mar-1956, 59 y.o.   MRN: [id number]  HPI 07/30/11- 40 yoM smoker followed for COPD/ bronchitis, OSA/ CPAP Last here 04/01/11- note reviewed. He had felt better on a trial of maintenance prednisone. He has felt more short of breath through the hot summer, with no acute event and without blood, chest pain or fever. Easy DOE around home. Has been sharing nebulizer with his wife, who now has a cold and we discussed this. Has been worse in last month, and expecially in recent rainy weather. Noting a little ankle swelling. Smothered trying to take a shower.  Only tolerating his CPAP about 3 nights/ week because he feels smothered, waking sore in anterior chest in the mornings.  Now down to only 2-3 cigarettes/ day and trying to stop.  CXR 08/14/10- Nl NAD PFT 05/26/08- FEV1/FVC 2.44/ 0.76, incr 19% w/ BD, R 0.59 ?DLCO 0.69  09/10/11- 55 yoM smoker followed for COPD/ bronchitis, OSA/ CPAP He reports feeling better "finally doing okay". He is down to just a couple of cigarettes a day. He is using CPAP regularly now and is comfortable with it. Alpha I antitrypsin Report came back normal "M. M." on 08/09/2011. Chest x-ray from 08/15/2011 showed stable changes of COPD. He has had flu vaccine.  11/12/11- 46 yoM smoker followed for COPD/ bronchitis, OSA/ CPAP Here with wife. Has had flu shot. He says he was doing well until 2 weeks ago. He again is coughing thick mucus which is difficult to clear. He depends on his metered inhaler and nebulizer to help clear his airways. Started Mucinex 3 days ago. Mucus is white. He denies fever, sore throat, chest pain, GI upset. Because of orthopnea he is sitting up to sleep for the past 2 nights.  01/14/12- 55 yoM smoker followed for COPD/ bronchitis, OSA/ CPAP Increase congested cough green to yellow sputum but he does not feel sick and denies fever. Sister who was a smoker recently died of lung cancer. Despite that and all of our  discussions, he still smokes a few cigarettes daily. We talked again about motivation to stop completely. Continues CPAP and Provigil one or 2 daily.  03/10/12  56 yoM smoker followed for COPD/ bronchitis, OSA/ CPAP Persistent bronchitic cough. Sputum is not purulent. He is "working on" his smoking habit but blames pollen for keeping him S. coughing now. Remains fully compliant with CPAP, all night every night. Still needs Provigil to help with residual daytime sleepiness as before.  05/12/12- 56 yoM smoker followed for COPD/ bronchitis, OSA/ CPAP Stays SOB all the time-even at rest; wheezing, coughing-productive-yellow in color, chest congestion, nasal congestion,. He is more interested today in smoking cessation which we discussed. He had quit for Daliresp after 10 days because of sustained nausea. He is taking all of his regular medicines and using his rescue inhaler several times a day. Off prednisone now for 1-2 months. CXR 01/23/12-  IMPRESSION:  No acute cardiopulmonary abnormality.  Original Report Authenticated By: Randall An, M.D.   07/13/12- 45 yoM smoker followed for COPD/ bronchitis, OSA/ CPAP, complicated by CAD Pt states increased sob,wheezing,productive cough x3 months. Still smoking with no serious effort to stop. His sister has lung cancer which has gotten his attention and he may be willing to try harder. Some days his breathing is better than others with no real trend. Coughing productive of white sputum with no blood. He has all of his medications currently.  07/30/12- 10 yoM  smoker followed for COPD/ bronchitis, OSA/ CPAP, complicated by CAD Cough-productive-yellow and thick; SOB, wheezing, chest congestion, head congestion x 1 week; O2 sat of 83% RA entered room-20 seconds RA sat of 90% CT 07/21/12-  IMPRESSION:  1. Tiny bilateral lung nodules which are similar to on the prior  exam, given differences in slice selection and measurement error.  Likely subpleural lymph  nodes.  2. Age advanced coronary artery atherosclerosis. Recommend  assessment of coronary risk factors and consideration of medical  therapy.  3. Slight enlargement of splenic lesion since 2007. Favored to  represent a hemangioma or lymphangioma.  Original Report Authenticated By: Areta Haber, M.D.   08/07/12- 55 yoM smoker followed for COPD/ bronchitis, OSA/ CPAP, complicated by CAD, disability for back pain. Cough-productive-white mostly; SOB , wheezing-worse at night     wife here Coughing spasms he can feel his upper airway is closing off. He he still smokes a little even when he feels badly and we went over this again today offering support.  11/13/12- 56 yoM smoker followed for COPD/ bronchitis, OSA/ CPAP, complicated by CAD, disability for back pain. FOLLOWS RCV:ELFYB still but not productive-more SOB  Went to ER in September or acute exacerbation of COPD but not admitted. Today he is a scheduled visit. He actually says he is doing pretty well with no acute problems. Perennial nasal congestion. He has reduced his cigarette smoking to about 2 per day and is strongly encouraged to stop completely now.  01/25/13-  57 yoM smoker followed for COPD/ bronchitis, OSA/ CPAP, complicated by CAD, disability for back pain. ACUTE VISIT: started smoking agian-discuss taking Chantix; feels raw in chest area with deep cough x 2 weeks. He feels well at this visit, meaning he is at his baseline some daily nonproductive cough and shortness of breath with exertion.  05/17/13-57 yoM smoker followed for COPD/ bronchitis, OSA/ CPAP, complicated by CAD, disability for back pain. FOLLOWS OFB:PZWCHENID is about the same as last visit; has lessened the about the amount he is smoking. Chantix helps. Continues CPAP/ 10/[facility] Discussed smoking cessation effort.  06/10/13- 57 yoM smoker followed for COPD/ bronchitis, OSA/ CPAP, complicated by CAD, disability for back pain. ACUTE VISIT: prod cough with yellow/green  mucus, increased SOB, wheezing, tightness in chest, chills x1 week.  still on augmentin and pred taper from 7/15 phone note > no better Trying Chantix. Wife is also sick. There exposed to his brother's child who has frequent colds. Just started prednisone taper and Augmentin yesterday. Had teeth pulled.  09/16/13- 57 yoM smoker followed for COPD/ bronchitis, OSA/ CPAP, complicated by CAD, disability for back pain. FOLLOWS FOR: was given the flu shot about 1 month and has not felt good since; continues to keep a headache, chest congestion at night. Describes frontal headache, head and chest congestion since a flu shot. Using Chantix, has reduced smoking to 2 or 3 cigarettes daily. Denies infection-no fever, sputum not purulent or bloody. No chest pain. Stays on Mucinex. Not using CPAP because of head congestion. Sister has died of lung cancer/smoker.  10/21/2013- 57 yoM smoker followed for COPD/ bronchitis, OSA/ CPAP, SqCell CA LUL,complicated by CAD, disability for back pain. FOLLOWS FOR: review bx results with patient; continues to have bad frontal lobe headache and lots of congestion. Nausea last night. No vomiting. Abnl CXR > CT chest/ large LUL mass to hilum. We had notified pt and scheduled bx. He is not coughing up much, denies chest pain. New headache mostly left frontal and  retro-orbital x 1 month with no lateralizing neuro. Remains tight on routine meds, off prednisone.  His wife blames "anxiety" for ER trip/ nebulizer the night before his bronchoscopy. Sister recently died of small cell Ca lung.  CT chest 09/17/13- IMPRESSION:  1. 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 This is associated with  some postobstructive changes in the left upper lobe as well as left  hilar and AP window lymphadenopathy. Based on these findings, and  assuming lack of distant metastatic disease (which will have to be  proven by other imaging  studies), this is favored to represent at  least T2b (if not T3 because of satellite nodules in the left upper  lobe), N2, Mx disease (i.e., at least stage IIIA disease). Further  evaluation with PET-CT and brain MRI with and without IV gadolinium  is suggested at this time for complete staging.  2. Mild centrilobular and paraseptal emphysema.  3. Atherosclerosis, including 3 vessel coronary artery disease.  Assessment for potential risk factor modification, dietary therapy  or pharmacologic therapy may be warranted, if clinically indicated.  4. Additional incidental findings, as above.  Electronically Signed  By: Vinnie Langton M.D.  On: 09/22/2013 11:34  Bronch / needle bx 10/04/13- by Dr Lamonte Sakai Pos Squamous Cell Ca  1/23//15- 57 yoM former smoker followed for COPD/ bronchitis, OSA/ CPAP, SqCell CA LUL/ XRT/Chemo, complicated by CAD, disability for back pain. Myocardial Perfusion 11/19/13- EF 65%, Nl wall motion, no ischemic changes. Follows for: frontal lobe headaches have gone away, SOB constantly, some prod cough with white/clear mucous.  Not smoking.  Nearing completion of current rounds of Chemo/ XRT.  Minor sore throat, breathing ok- some SOB, dry cough.  No recent steroids or ABX except Advair.  Continues CPAP 10/ [facility] with no problem.   01/06/13- 57 yoM former smoker followed for COPD/ bronchitis, OSA/ CPAP, SqCell CA LUL/ XRT/Chemo, complicated by CAD, disability for back pain. ACUTE VISIT:  Increase sob, wheezing and cough with congestion head and chest. Also, has bodyaches and sweats Acute illness x1 week started as head congestion moving to his chest with fever, white sputum turning yellow, myalgias. No blood. Had had flu shot. Has completed radiation and chemotherapy pending next oncology followup. Admits smoking 2 cigarettes since last here.  03/02/14- 67 yoM some-time smoker followed for COPD/ bronchitis, OSA/ CPAP, SqCell CA LUL/ XRT/Chemo, complicated by CAD, disability  for back pain. FOLLOWS FOR: Post hospital; Pt states that continues to have congestion, wheezing, and having left sided chest pain this morning. Had reaction to chemo yesterday-started having trouble breathing and hot flashes-was given breathing tx and injection. Hospitalized in with pneumonia, COPD with asthma 2 weeks ago. CT chest 01/28/2014 showed his left upper lobe mass to be cavitating. Yesterday he had reaction to chemotherapy requiring nebulizer treatment and injection. More dyspnea on exertion at baseline, gradually worse over the past month. Coughing clear phlegm. Nebulizer helps temporarily. This morning head left upper anterior lateral chest pain which is gradually fading. He admits he is smoking some-discussed. Continues CPAP 10/[facility] CXR 02/15/14 IMPRESSION:  No acute findings and no change from the study performed earlier  this same day.  Electronically Signed  By: Lajean Manes M.D.  On: 02/15/2014 09:23  04/19/14- 7 yoM some-time smoker followed for COPD/ bronchitis, OSA/ CPAP, SqCell CA LUL/ XRT/Chemo, complicated by CAD, disability for back pain, DM. FOLLOWS FOR: Breathing is terrible. Recently had PNA-Dr Yoo-still on abx  for this. CPAP 10/ O2 2L/ [facility] for sleep Walk test today 3x 180 m on room air- lowest sat was 92%. Scheduled for 3 more days of Levaquin for pneumonia. Improving significantly now. Cough is productive white. CT chest 04/12/14 IMPRESSION:  1. Favor response to therapy of a residual cavitary left upper lobe  lung lesion. Decreased size of the cavitary component with increased  surrounding soft tissue thickening, favored to be treatment related.  Similarly, evolving radiation change within the surrounding left  upper lobe and left apex. The apical presumed radiation change  obscures the previously described left apical pulmonary nodule.  Recommend attention on follow-up.  2. Similar borderline prevascular adenopathy.  3. Resolved left-sided pleural effusion  with trace left pleural  fluid remaining.  4. Minimal left lower lobe nodularity which could be infectious or  inflammatory. Recommend attention on follow-up.  5. Pulmonary artery enlargement suggests pulmonary arterial  hypertension.  6. Incompletely imaged similar cystic/septated splenic lesion. Favor  a benign lesion such as a lymphangioma.  Electronically Signed  By: Abigail Miyamoto M.D.  On: 04/12/2014 13:57  07/19/14- 13 yoM some-time smoker followed for COPD/ bronchitis, OSA/ CPAP, SqCell CA LUL/ XRT/Chemo, complicated by CAD, disability for back pain, DM.    Wife here FOLLOWS FOR: has had choking cough since last here-productive and clear in color. SOB and wheezing. He says cough is not new and not changed. No blood. Occasional localized left parasternal pain lasts for a day or 2 at a time. He is using rescue inhaler too frequently and we discussed use of maintenance controller and nebulizer machine more appropriately. Takes occasional Nuvigil but still needs to nap. Continues Nexium without recognizing much heartburn.  10/19/14- 58 yoM former smoker followed for COPD/ bronchitis, OSA/ CPAP, SqCell CA LUL/ XRT/Chemo, complicated by CAD, disability for back pain, DM.     FOLLOWS FOR: Increased SOB and wheezing, Chest congestion-unable to cough anything up, chills as well x 2 weeks, no fever, blood, or purulent. Sleeps propped up recliner. Neb 4-5x/ day, frequent rescue inhaler. Sleep O2 2l/ [facility] with CPAP 10 are ok used every night. Needs O2 day/ portable Walk test today - 85% on RA at rest, corrected w O2.. CT 07/19/14 IMPRESSION: Decreased size of cavitary component of left upper lobe mass lesion corresponding to the reported history of the lung cancer. The degree of adjacent volume loss and masslike consolidation of the left upper lobe is increased since previously which may reflect treatment effect secondary to radiation fibrosis. This is amenable to followup and continued  surveillance. No new evidence for intrathoracic metastatic disease. Electronically Signed  By: Conchita Paris M.D.  On: 07/19/2014 14:06  11/15/2014 Acute OV  Complains of cough, congestion , wheezing , dyspnea and tightness for last 3 weeks. Was called in prednisone taper last week  with only minimal improvement . Coughing up thick mucus but unable to get anything up .  Was seen by PCP initially tx w/ Doxycycline on 10/26/14  Seen by cardiology last week , Imdur increased for chest tightness.  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 tx w/ chemo and XRT  CT on 10/21/14 showed stable LUL lesion and increased aspdz in LLL likely from radiation changes. New patchy opacity in RLL suspicious for infection/inflammation.  No evidence of lymphadenopathy.  Pt is followed by [name] and has repeat CT chest planned  in 12/2014.  rec Begin Levaquin 500 mg daily for 7 days Mucinex DM twice daily as needed for cough and congestion Prednisone taper over the next week Fluids and rest  12/15/2014  Acute  ov/Wert re: refractory cough/ wheeze across  the room  No better p pred/levaquin  Chief Complaint  Patient presents with  . Acute Visit    Pt c/o cough and increased SOB for the past month. He has prod cough with minimal white sputum. He states that he is SOB with any exertion- using albuterol inhaler 3-4 x per day and neb with albuterol 5 x per day.   sob at rest is better  when on 3lpm  Walking on up 3lpm  Sleeping about 30 degrees in recliner 3lpm but using neb 3 x nightly and only a little better transiently, no resp to rob with codie, no purulent sputum or hemoptysis gen ant cp with coughing only  CXR PA and Lateral:   12/15/2014 :     I personally reviewed images and agree with radiology impression as follows:    Stable chronic  consolidation and postradiation changes in left upper  lobe. No definite superimposed infiltrate or pulmonary edema.   01/19/15- 40 yoM former smoker followed for COPD/ bronchitis, chronic hypoxic Resp Failure, OSA/ CPAP, SqCell CA LUL/ XRT/Chemo, complicated by CAD, disability for back pain, DM.  FOLLOWS FOR: Pt has restarted Theophylline ER '200mg'$  1 tablet BID and seems to be helping his breathing; would like to have Rx for this. CPAP 10/ O2 3 L/ [facility]  Next CT sched in March He struggles for breathing comfort now at baseline without a distinct acute event recently. He asks to increase the strength of his Advair.  04/19/15-59 yoM former smoker followed for COPD/ bronchitis, chronic hypoxic Resp Failure, OSA/ CPAP, SqCell CA LUL/ XRT/Chemo, complicated by CAD, disability for back pain, DM.  Reports: PT. states COPD is about the same still experiencing wheezing and SOB, pollen is a problem.  Hosp f/u 5/17-5/21- [facility] w  Pneumonia. Now much improved. CPAP 10/ [facility] 3L O2 made him feel smothered so he is not wearing it. Asks hospital bed. He needs an adjustable bed and wife was not strong enough to help him. He is significantly limited by muscle weakness and dyspnea on exertion related to his lung disease and lung cancer. CT chest 04/14/15-image reviewed IMPRESSION: 1. Stable appearance of left upper lobe over multiple prior studies, consistent with post treatment changes. 2. New infiltrate within the right upper lobe and left lower lobe, likely infectious. 3. Emphysematous changes. 4. Stable splenic lesion. Electronically Signed  By: Nolon Nations M.D.  On: 04/14/2015 20:23   ROS-see HPI   Negative unless "+" Constitutional:    weight loss, night sweats, fevers, chills, fatigue, lassitude. HEENT:    headaches, difficulty swallowing, tooth/dental problems, sore throat,       sneezing, itching, ear ache, nasal congestion, post nasal drip, snoring CV:    chest pain, orthopnea, PND,  swelling in lower extremities, anasarca,                                  dizziness, palpitations Resp:   +shortness of breath with exertion or at rest.                +productive cough,   +non-productive cough, coughing up of blood.  change in color of mucus.  +wheezing.   Skin:    rash or lesions. GI:  No-   heartburn, indigestion, abdominal pain, nausea, vomiting,  GU:  MS:   joint pain, stiffness,  Neuro-     nothing unusual Psych:  change in mood or affect.  depression or anxiety.   memory loss.     Objective:   OBJ- Physical Exam General- Alert, Oriented, Affect-appropriate, Distress- none acute, + chronically ill    appears comfortable sitting quietly on room air Skin- rash-none, lesions- none, excoriation- none Lymphadenopathy- none Head- atraumatic            Eyes- Gross vision intact, PERRLA, conjunctivae and secretions clear            Ears- Hearing, canals-normal            Nose- Clear, no-Septal dev, mucus, polyps, erosion, perforation             Throat- Mallampati II , mucosa clear , drainage- none, tonsils- atrophic, + hoarse Neck- flexible , trachea midline, no stridor , thyroid nl, carotid no bruit Chest - symmetrical excursion , unlabored           Heart/CV- RRR , no murmur , no gallop  , no rub, nl s1 s2                           - JVD- none , edema- none, stasis changes- none, varices- none           Lung- + distant, wheeze + faint, cough- none , dullness-none, rub- none           Chest wall-  Abd-  Br/ Gen/ Rectal- Not done, not indicated Extrem- cyanosis- none, clubbing, none, atrophy- none, strength- nl Neuro- grossly intact to observation          Expand All Collapse All   Patient ID: Ernest [name], male DOB: 09/13/1956, 59 y.o. MRN: [id number]  HPI 07/30/11- 44 yoM smoker followed for COPD/ bronchitis, OSA/ CPAP Last here 04/01/11- note reviewed. He had felt better on a trial of maintenance prednisone. He has felt more short of  breath through the hot summer, with no acute event and without blood, chest pain or fever. Easy DOE around home. Has been sharing nebulizer with his wife, who now has a cold and we discussed this. Has been worse in last month, and expecially in recent rainy weather. Noting a little ankle swelling. Smothered trying to take a shower.  Only tolerating his CPAP about 3 nights/ week because he feels smothered, waking sore in anterior chest in the mornings.  Now down to only 2-3 cigarettes/ day and trying to stop.  CXR 08/14/10- Nl NAD PFT 05/26/08- FEV1/FVC 2.44/ 0.76, incr 19% w/ BD, R 0.59 ?DLCO 0.69  09/10/11- 55 yoM smoker followed for COPD/ bronchitis, OSA/ CPAP He reports feeling better "finally doing okay". He is down to just a couple of cigarettes a day. He is using CPAP regularly now and is comfortable with it. Alpha I antitrypsin Report came back normal "M. M." on 08/09/2011. Chest x-ray from 08/15/2011 showed stable changes of COPD. He has had flu vaccine.  11/12/11- 30 yoM smoker followed for COPD/ bronchitis, OSA/ CPAP Here with wife. Has had flu shot. He says he was doing well until 2 weeks ago. He again is coughing thick mucus which is difficult to clear. He depends on his metered inhaler and nebulizer to  help clear his airways. Started Mucinex 3 days ago. Mucus is white. He denies fever, sore throat, chest pain, GI upset. Because of orthopnea he is sitting up to sleep for the past 2 nights.  01/14/12- 55 yoM smoker followed for COPD/ bronchitis, OSA/ CPAP Increase congested cough green to yellow sputum but he does not feel sick and denies fever. Sister who was a smoker recently died of lung cancer. Despite that and all of our discussions, he still smokes a few cigarettes daily. We talked again about motivation to stop completely. Continues CPAP and Provigil one or 2 daily.  03/10/12 56 yoM smoker followed for COPD/ bronchitis, OSA/ CPAP Persistent bronchitic cough. Sputum is not  purulent. He is "working on" his smoking habit but blames pollen for keeping him S. coughing now. Remains fully compliant with CPAP, all night every night. Still needs Provigil to help with residual daytime sleepiness as before.  05/12/12- 56 yoM smoker followed for COPD/ bronchitis, OSA/ CPAP Stays SOB all the time-even at rest; wheezing, coughing-productive-yellow in color, chest congestion, nasal congestion,. He is more interested today in smoking cessation which we discussed. He had quit for Daliresp after 10 days because of sustained nausea. He is taking all of his regular medicines and using his rescue inhaler several times a day. Off prednisone now for 1-2 months. CXR 01/23/12-  IMPRESSION:  No acute cardiopulmonary abnormality.  Original Report Authenticated By: Randall An, M.D.   07/13/12- 74 yoM smoker followed for COPD/ bronchitis, OSA/ CPAP, complicated by CAD Pt states increased sob,wheezing,productive cough x3 months. Still smoking with no serious effort to stop. His sister has lung cancer which has gotten his attention and he may be willing to try harder. Some days his breathing is better than others with no real trend. Coughing productive of white sputum with no blood. He has all of his medications currently.  07/30/12- 56 yoM smoker followed for COPD/ bronchitis, OSA/ CPAP, complicated by CAD Cough-productive-yellow and thick; SOB, wheezing, chest congestion, head congestion x 1 week; O2 sat of 83% RA entered room-20 seconds RA sat of 90% CT 07/21/12-  IMPRESSION:  1. Tiny bilateral lung nodules which are similar to on the prior  exam, given differences in slice selection and measurement error.  Likely subpleural lymph nodes.  2. Age advanced coronary artery atherosclerosis. Recommend  assessment of coronary risk factors and consideration of medical  therapy.  3. Slight enlargement of splenic lesion since 2007. Favored to  represent a hemangioma or lymphangioma.   Original Report Authenticated By: Areta Haber, M.D.   08/07/12- 71 yoM smoker followed for COPD/ bronchitis, OSA/ CPAP, complicated by CAD, disability for back pain. Cough-productive-white mostly; SOB , wheezing-worse at night wife here Coughing spasms he can feel his upper airway is closing off. He he still smokes a little even when he feels badly and we went over this again today offering support.  11/13/12- 56 yoM smoker followed for COPD/ bronchitis, OSA/ CPAP, complicated by CAD, disability for back pain. FOLLOWS WJX:BJYNW still but not productive-more SOB  Went to ER in September or acute exacerbation of COPD but not admitted. Today he is a scheduled visit. He actually says he is doing pretty well with no acute problems. Perennial nasal congestion. He has reduced his cigarette smoking to about 2 per day and is strongly encouraged to stop completely now.  01/25/13- 57 yoM smoker followed for COPD/ bronchitis, OSA/ CPAP, complicated by CAD, disability for back pain. ACUTE VISIT: started smoking agian-discuss  taking Chantix; feels raw in chest area with deep cough x 2 weeks. He feels well at this visit, meaning he is at his baseline some daily nonproductive cough and shortness of breath with exertion.  05/17/13-57 yoM smoker followed for COPD/ bronchitis, OSA/ CPAP, complicated by CAD, disability for back pain. FOLLOWS ZHG:DJMEQASTM is about the same as last visit; has lessened the about the amount he is smoking. Chantix helps. Continues CPAP/ 10/[facility] Discussed smoking cessation effort.  06/10/13- 57 yoM smoker followed for COPD/ bronchitis, OSA/ CPAP, complicated by CAD, disability for back pain. ACUTE VISIT: prod cough with yellow/green mucus, increased SOB, wheezing, tightness in chest, chills x1 week. still on augmentin and pred taper from 7/15 phone note > no better Trying Chantix. Wife is also sick. There exposed to his brother's child who has frequent colds. Just started  prednisone taper and Augmentin yesterday. Had teeth pulled.  09/16/13- 57 yoM smoker followed for COPD/ bronchitis, OSA/ CPAP, complicated by CAD, disability for back pain. FOLLOWS FOR: was given the flu shot about 1 month and has not felt good since; continues to keep a headache, chest congestion at night. Describes frontal headache, head and chest congestion since a flu shot. Using Chantix, has reduced smoking to 2 or 3 cigarettes daily. Denies infection-no fever, sputum not purulent or bloody. No chest pain. Stays on Mucinex. Not using CPAP because of head congestion. Sister has died of lung cancer/smoker.  2013/10/18- 57 yoM smoker followed for COPD/ bronchitis, OSA/ CPAP, SqCell CA LUL,complicated by CAD, disability for back pain. FOLLOWS FOR: review bx results with patient; continues to have bad frontal lobe headache and lots of congestion. Nausea last night. No vomiting. Abnl CXR > CT chest/ large LUL mass to hilum. We had notified pt and scheduled bx. He is not coughing up much, denies chest pain. New headache mostly left frontal and retro-orbital x 1 month with no lateralizing neuro. Remains tight on routine meds, off prednisone.  His wife blames "anxiety" for ER trip/ nebulizer the night before his bronchoscopy. Sister recently died of small cell Ca lung.  CT chest 09/17/13- IMPRESSION:  1. 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 This is associated with  some postobstructive changes in the left upper lobe as well as left  hilar and AP window lymphadenopathy. Based on these findings, and  assuming lack of distant metastatic disease (which will have to be  proven by other imaging studies), this is favored to represent at  least T2b (if not T3 because of satellite nodules in the left upper  lobe), N2, Mx disease (i.e., at least stage IIIA disease). Further  evaluation with PET-CT and brain MRI with and without IV  gadolinium  is suggested at this time for complete staging.  2. Mild centrilobular and paraseptal emphysema.  3. Atherosclerosis, including 3 vessel coronary artery disease.  Assessment for potential risk factor modification, dietary therapy  or pharmacologic therapy may be warranted, if clinically indicated.  4. Additional incidental findings, as above.  Electronically Signed  By: Vinnie Langton M.D.  On: 09/22/2013 11:34  Bronch / needle bx 10/04/13- by Dr Lamonte Sakai Pos Squamous Cell Ca  1/23//15- 57 yoM former smoker followed for COPD/ bronchitis, OSA/ CPAP, SqCell CA LUL/ XRT/Chemo, complicated by CAD, disability for back pain. Myocardial Perfusion 11/19/13- EF 65%, Nl wall motion, no ischemic changes. Follows for: frontal lobe headaches have gone away, SOB constantly, some prod cough with white/clear mucous.  Not smoking.  Nearing completion of current rounds of Chemo/ XRT.  Minor sore throat, breathing ok- some SOB, dry cough.  No recent steroids or ABX except Advair.  Continues CPAP 10/ [facility] with no problem.   01/06/13- 57 yoM former smoker followed for COPD/ bronchitis, OSA/ CPAP, SqCell CA LUL/ XRT/Chemo, complicated by CAD, disability for back pain. ACUTE VISIT: Increase sob, wheezing and cough with congestion head and chest. Also, has bodyaches and sweats Acute illness x1 week started as head congestion moving to his chest with fever, white sputum turning yellow, myalgias. No blood. Had had flu shot. Has completed radiation and chemotherapy pending next oncology followup. Admits smoking 2 cigarettes since last here.  03/02/14- 13 yoM some-time smoker followed for COPD/ bronchitis, OSA/ CPAP, SqCell CA LUL/ XRT/Chemo, complicated by CAD, disability for back pain. FOLLOWS FOR: Post hospital; Pt states that continues to have congestion, wheezing, and having left sided chest pain this morning. Had reaction to chemo yesterday-started having trouble breathing and hot  flashes-was given breathing tx and injection. Hospitalized in with pneumonia, COPD with asthma 2 weeks ago. CT chest 01/28/2014 showed his left upper lobe mass to be cavitating. Yesterday he had reaction to chemotherapy requiring nebulizer treatment and injection. More dyspnea on exertion at baseline, gradually worse over the past month. Coughing clear phlegm. Nebulizer helps temporarily. This morning head left upper anterior lateral chest pain which is gradually fading. He admits he is smoking some-discussed. Continues CPAP 10/[facility] CXR 02/15/14 IMPRESSION:  No acute findings and no change from the study performed earlier  this same day.  Electronically Signed  By: Lajean Manes M.D.  On: 02/15/2014 09:23  04/19/14- 19 yoM some-time smoker followed for COPD/ bronchitis, OSA/ CPAP, SqCell CA LUL/ XRT/Chemo, complicated by CAD, disability for back pain, DM. FOLLOWS FOR: Breathing is terrible. Recently had PNA-Dr Yoo-still on abx for this. CPAP 10/ O2 2L/ [facility] for sleep Walk test today 3x 180 m on room air- lowest sat was 92%. Scheduled for 3 more days of Levaquin for pneumonia. Improving significantly now. Cough is productive white. CT chest 04/12/14 IMPRESSION:  1. Favor response to therapy of a residual cavitary left upper lobe  lung lesion. Decreased size of the cavitary component with increased  surrounding soft tissue thickening, favored to be treatment related.  Similarly, evolving radiation change within the surrounding left  upper lobe and left apex. The apical presumed radiation change  obscures the previously described left apical pulmonary nodule.  Recommend attention on follow-up.  2. Similar borderline prevascular adenopathy.  3. Resolved left-sided pleural effusion with trace left pleural  fluid remaining.  4. Minimal left lower lobe nodularity which could be infectious or  inflammatory. Recommend attention on follow-up.  5. Pulmonary artery enlargement  suggests pulmonary arterial  hypertension.  6. Incompletely imaged similar cystic/septated splenic lesion. Favor  a benign lesion such as a lymphangioma.  Electronically Signed  By: Abigail Miyamoto M.D.  On: 04/12/2014 13:57  07/19/14- 32 yoM some-time smoker followed for COPD/ bronchitis, OSA/ CPAP, SqCell CA LUL/ XRT/Chemo, complicated by CAD, disability for back pain, DM. Wife here FOLLOWS FOR: has had choking cough since last here-productive and clear in color. SOB and wheezing. He says cough is not new and not changed. No blood. Occasional localized left parasternal pain lasts for a day or 2 at a time. He is using rescue inhaler too frequently and we discussed use of maintenance controller and nebulizer machine more appropriately. Takes occasional Nuvigil but still needs to nap. Continues  Nexium without recognizing much heartburn.  10/19/14- 58 yoM former smoker followed for COPD/ bronchitis, OSA/ CPAP, SqCell CA LUL/ XRT/Chemo, complicated by CAD, disability for back pain, DM.  FOLLOWS FOR: Increased SOB and wheezing, Chest congestion-unable to cough anything up, chills as well x 2 weeks, no fever, blood, or purulent. Sleeps propped up recliner. Neb 4-5x/ day, frequent rescue inhaler. Sleep O2 2l/ [facility] with CPAP 10 are ok used every night. Needs O2 day/ portable Walk test today - 85% on RA at rest, corrected w O2.. CT 07/19/14 IMPRESSION: Decreased size of cavitary component of left upper lobe mass lesion corresponding to the reported history of the lung cancer. The degree of adjacent volume loss and masslike consolidation of the left upper lobe is increased since previously which may reflect treatment effect secondary to radiation fibrosis. This is amenable to followup and continued surveillance. No new evidence for intrathoracic metastatic disease. Electronically Signed  By: Conchita Paris M.D.  On: 07/19/2014 14:06  11/15/2014 Acute OV  Complains of cough,  congestion , wheezing , dyspnea and tightness for last 3 weeks. Was called in prednisone taper last week with only minimal improvement . Coughing up thick mucus but unable to get anything up .  Was seen by PCP initially tx w/ Doxycycline on 10/26/14  Seen by cardiology last week , Imdur increased for chest tightness.  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 tx w/ chemo and XRT  CT on 10/21/14 showed stable LUL lesion and increased aspdz in LLL likely from radiation changes. New patchy opacity in RLL suspicious for infection/inflammation.  No evidence of lymphadenopathy.  Pt is followed by [name] and has repeat CT chest planned in 12/2014.  rec Begin Levaquin 500 mg daily for 7 days Mucinex DM twice daily as needed for cough and congestion Prednisone taper over the next week Fluids and rest  12/15/2014 Acute ov/Wert re: refractory cough/ wheeze across the room No better p pred/levaquin  Chief Complaint  Patient presents with  . Acute Visit    Pt c/o cough and increased SOB for the past month. He has prod cough with minimal white sputum. He states that he is SOB with any exertion- using albuterol inhaler 3-4 x per day and neb with albuterol 5 x per day.   sob at rest is better when on 3lpm  Walking on up 3lpm  Sleeping about 30 degrees in recliner 3lpm but using neb 3 x nightly and only a little better transiently, no resp to rob with codie, no purulent sputum or hemoptysis gen ant cp with coughing only  CXR PA and Lateral: 12/15/2014 :  I personally reviewed images and agree with radiology impression as follows:  Stable chronic consolidation and postradiation changes in left upper  lobe. No definite superimposed infiltrate or pulmonary edema.  01/19/15- 47 yoM former smoker followed for COPD/  bronchitis, chronic hypoxic Resp Failure, OSA/ CPAP, SqCell CA LUL/ XRT/Chemo, complicated by CAD, disability for back pain, DM.  FOLLOWS FOR: Pt has restarted Theophylline ER '200mg'$  1 tablet BID and seems to be helping his breathing; would like to have Rx for this. CPAP 10/ O2 3 L/ [facility]  Next CT sched in March He struggles for breathing comfort now at baseline without a distinct acute event recently. He asks to increase the strength of his Advair.  04/19/15-59 yoM former smoker followed for  COPD/ bronchitis, chronic hypoxic Resp Failure, OSA/ CPAP, SqCell CA LUL/ XRT/Chemo, complicated by CAD, disability for back pain, DM.  Reports: PT. states COPD is about the same still experiencing wheezing and SOB, pollen is a problem.  Hosp f/u 5/17-5/21- [facility] w Pneumonia. Now much improved. CPAP 10/ [facility] 3L O2 made him feel smothered so he is not wearing it. Asks hospital bed. He needs an adjustable bed and wife was not strong enough to help him. He is significantly limited by muscle weakness and dyspnea on exertion related to his lung disease and lung cancer. CT chest 04/14/15-image reviewed IMPRESSION: 1. Stable appearance of left upper lobe over multiple prior studies, consistent with post treatment changes. 2. New infiltrate within the right upper lobe and left lower lobe, likely infectious. 3. Emphysematous changes. 4. Stable splenic lesion. Electronically Signed  By: Nolon Nations M.D.  On: 04/14/2015 20:23       07/14/15- Acute- TP   Pt presents for an acute office visit.  Complains of head and chest congestion, PND, sore throat and increased SOB. Prod cough with thick white mucus since 06/21/15. Denies any fever, nausea or vomiting.  On Advair , did not like stiolto  Taking mucinex without much help.  Denies chest pain, orthopnea, edema or fever.   09/22/15- 51 yoM former smoker followed for COPD/ bronchitis, chronic hypoxic Resp Failure, OSA/ CPAP, SqCell CA LUL/  XRT/Chemo, complicated by CAD, disability for back pain, DM.  Follow for: Increased SOB on exertion, wheezing, prod cough with white mucus.Pt states that after coughing a lot he will get dizzy for a short period of time. Head and chest congestion has come back. Ran out of Theophylline x 4 days, coughing got worse. Pt was ableto get a refill and has been taking Theophylline for the past 2 days.  Not doing well, always short of breath. Wears oxygen at night and at rest at home but came without it here. Uses a small face mask with his portable oxygen and describes putting his nebulizer solution liquid into that mask, thinking he gets some benefit from inhaling that with his oxygen when he is moving around. Left anterior chest pain, persistent, hurts to press on the area. Sees cardiology. Recent CT scan ordered by oncology and reviewed. Tussive lightheadedness without syncope being treated with Mucinex DM. Scant mucus is clear. Rescue Ventolin inhaler little help. Nasal congestion, difficult breathing through his nose at times. Not much discharge. Had flu vaccine CT images reviewed CT chest 09/06/15 IMPRESSION: 1. Stable post treatment change in the central left upper lung, with no evidence of local tumor recurrence. 2. Interval resolution of previously described inflammatory opacity in the right upper and left lower lobes. 3. Mild interval growth of an indeterminate right lower lobe 5 mm ground-glass pulmonary nodule, metastasis not excluded. Two new tiny right upper lobe pulmonary nodules, indeterminate, possibly residual inflammatory foci given the recent infection in this location. 4. Stable mild subcarinal mediastinal lymphadenopathy. 5. Mild centrilobular emphysema. 6. Stable dilated main pulmonary artery suggesting chronic pulmonary arterial hypertension. 7. Atherosclerosis, including three-vessel coronary artery disease. 8. Healed anterior left second rib deformity. 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 Electronically Signed  By: Ilona Sorrel M.D.  On: 09/06/2015 12:05  ROS-see HPI   Negative unless "+" Constitutional:    weight loss, night sweats, fevers, chills, fatigue, lassitude. HEENT:    headaches, difficulty swallowing, tooth/dental problems, sore throat,  sneezing, itching, ear ache, + nasal congestion, post nasal drip, snoring CV:    chest pain, orthopnea, PND, swelling in lower extremities, anasarca,                                                dizziness, palpitations Resp:   shortness of breath with exertion or at rest.                productive cough,   non-productive cough, coughing up of blood.              change in color of mucus.  wheezing.   Skin:    rash or lesions. GI:  No-   heartburn, indigestion, abdominal pain, nausea, vomiting, GU: . MS:   joint pain, stiffness, . Neuro-     nothing unusual Psych:  change in mood or affect.  depression or anxiety.   memory loss.  OBJ- Physical Exam General- Alert, Oriented, Affect-appropriate, Distress- none acute, + overweight, + resting room air sat 97% Skin- rash-none, lesions- none, excoriation- none Lymphadenopathy- none Head- atraumatic            Eyes- Gross vision intact, PERRLA, conjunctivae and secretions clear            Ears- Hearing, canals-normal            Nose- Clear, no-Septal dev, mucus, polyps, erosion, perforation             Throat- Mallampati II , mucosa clear , drainage- none, tonsils- atrophic Neck- flexible , trachea midline, no stridor , thyroid nl, carotid no bruit Chest - symmetrical excursion , unlabored           Heart/CV- RRR , no murmur , no gallop  , no rub, nl s1 s2                           - JVD- none , edema- none, stasis changes- none, varices- none           Lung- + distant without rhonchi, wheeze- none, cough- none , dullness-none, rub- none           Chest wall-  Abd-   Br/ Gen/ Rectal- Not done, not indicated Extrem- cyanosis- none, clubbing, none, atrophy- none, strength- nl Neuro- grossly intact to observation

## 2015-09-22 NOTE — Assessment & Plan Note (Signed)
Aggravated by nasal oxygen flow Plan-nasal nebulizer decongestant treatment, Depo-Medrol, saline nasal spray, continue Nasonex

## 2015-09-24 DIAGNOSIS — C342 Malignant neoplasm of middle lobe, bronchus or lung: Secondary | ICD-10-CM | POA: Diagnosis not present

## 2015-09-24 DIAGNOSIS — J449 Chronic obstructive pulmonary disease, unspecified: Secondary | ICD-10-CM | POA: Diagnosis not present

## 2015-09-24 DIAGNOSIS — J9612 Chronic respiratory failure with hypercapnia: Secondary | ICD-10-CM | POA: Diagnosis not present

## 2015-09-24 DIAGNOSIS — J9601 Acute respiratory failure with hypoxia: Secondary | ICD-10-CM | POA: Diagnosis not present

## 2015-09-27 DIAGNOSIS — F3341 Major depressive disorder, recurrent, in partial remission: Secondary | ICD-10-CM | POA: Diagnosis not present

## 2015-09-28 ENCOUNTER — Ambulatory Visit: Payer: Commercial Managed Care - HMO | Admitting: Internal Medicine

## 2015-10-03 NOTE — Patient Outreach (Signed)
[facility]) Care Management  10/03/2015  Ernest [name] 06/25/1956 [id number]   Referral from [facility] tier 4 list, assigned Sherrin Daisy, RN for patient outreach.  [name] L. Charles Niese, [place] Care Management Assistant

## 2015-10-04 DIAGNOSIS — J449 Chronic obstructive pulmonary disease, unspecified: Secondary | ICD-10-CM | POA: Diagnosis not present

## 2015-10-05 ENCOUNTER — Ambulatory Visit (INDEPENDENT_AMBULATORY_CARE_PROVIDER_SITE_OTHER)
Admission: RE | Admit: 2015-10-05 | Discharge: 2015-10-05 | Disposition: A | Discharge status: Home / Self Care | Payer: Commercial Managed Care - HMO | Source: Ambulatory Visit | Attending: Internal Medicine | Admitting: Internal Medicine

## 2015-10-05 ENCOUNTER — Ambulatory Visit (INDEPENDENT_AMBULATORY_CARE_PROVIDER_SITE_OTHER): Payer: Commercial Managed Care - HMO | Admitting: Internal Medicine

## 2015-10-05 ENCOUNTER — Encounter: Payer: Self-pay | Admitting: Internal Medicine

## 2015-10-05 ENCOUNTER — Other Ambulatory Visit (INDEPENDENT_AMBULATORY_CARE_PROVIDER_SITE_OTHER): Payer: Commercial Managed Care - HMO

## 2015-10-05 VITALS — BP 140/70 | HR 67 | Temp 98.0°F | Resp 20 | Ht 66.0 in | Wt 209.0 lb

## 2015-10-05 DIAGNOSIS — I10 Essential (primary) hypertension: Secondary | ICD-10-CM

## 2015-10-05 DIAGNOSIS — G894 Chronic pain syndrome: Secondary | ICD-10-CM

## 2015-10-05 DIAGNOSIS — E785 Hyperlipidemia, unspecified: Secondary | ICD-10-CM

## 2015-10-05 DIAGNOSIS — R059 Cough, unspecified: Secondary | ICD-10-CM

## 2015-10-05 DIAGNOSIS — R05 Cough: Secondary | ICD-10-CM

## 2015-10-05 DIAGNOSIS — E119 Type 2 diabetes mellitus without complications: Secondary | ICD-10-CM | POA: Diagnosis not present

## 2015-10-05 DIAGNOSIS — J441 Chronic obstructive pulmonary disease with (acute) exacerbation: Secondary | ICD-10-CM

## 2015-10-05 LAB — URINALYSIS, ROUTINE W REFLEX MICROSCOPIC
BILIRUBIN URINE: NEGATIVE
Hgb urine dipstick: NEGATIVE
Ketones, ur: NEGATIVE
Leukocytes, UA: NEGATIVE
NITRITE: NEGATIVE
PH: 6 (ref 5.0–8.0)
RBC / HPF: NONE SEEN (ref 0–?)
Specific Gravity, Urine: 1.025 (ref 1.000–1.030)
TOTAL PROTEIN, URINE-UPE24: NEGATIVE
Urine Glucose: NEGATIVE
Urobilinogen, UA: 0.2 (ref 0.0–1.0)

## 2015-10-05 LAB — BASIC METABOLIC PANEL
BUN: 12 mg/dL (ref 6–23)
CHLORIDE: 99 meq/L (ref 96–112)
CO2: 35 mEq/L — ABNORMAL HIGH (ref 19–32)
CREATININE: 0.92 mg/dL (ref 0.40–1.50)
Calcium: 9.6 mg/dL (ref 8.4–10.5)
GFR: 88.96 mL/min (ref >60.00)
Glucose, Bld: 120 mg/dL — ABNORMAL HIGH (ref 70–99)
Potassium: 4.2 mEq/L (ref 3.5–5.1)
Sodium: 140 mEq/L (ref 135–145)

## 2015-10-05 LAB — LIPID PANEL
CHOLESTEROL: 164 mg/dL (ref 0–200)
HDL: 48.7 mg/dL (ref >39.00)
LDL CALC: 80 mg/dL (ref 0–99)
NonHDL: 115.01
Total CHOL/HDL Ratio: 3
Triglycerides: 174 mg/dL — ABNORMAL HIGH (ref 0.0–149.0)
VLDL: 34.8 mg/dL (ref 0.0–40.0)

## 2015-10-05 LAB — MICROALBUMIN / CREATININE URINE RATIO
Creatinine,U: 198.8 mg/dL
MICROALB/CREAT RATIO: 2.9 mg/g (ref 0.0–30.0)
Microalb, Ur: 5.7 mg/dL — ABNORMAL HIGH (ref 0.0–1.9)

## 2015-10-05 LAB — HEMOGLOBIN A1C: HEMOGLOBIN A1C: 6.2 % (ref 4.6–6.5)

## 2015-10-05 MED ORDER — AMOXICILLIN-POT CLAVULANATE 875-125 MG PO TABS: 1.0000 | ORAL_TABLET | Freq: Two times a day (BID) | ORAL | Status: AC

## 2015-10-05 MED ORDER — MORPHINE SULFATE ER 30 MG PO TBCR: 30.0000 mg | EXTENDED_RELEASE_TABLET | Freq: Two times a day (BID) | ORAL | Status: DC

## 2015-10-05 NOTE — Patient Instructions (Signed)

## 2015-10-05 NOTE — Progress Notes (Signed)
Subjective:  Patient ID: Ernest [name], male    DOB: 09-18-1956  Age: 59 y.o. MRN: [id number]  CC: Cough; Hypertension; Hyperlipidemia; and Diabetes  New to me  HPI Ernest [name] presents for a cough productive of thick phlegm for about a week. He also describes some shortness of breath but no fever, chills, night sweats, chest pain, or hemoptysis. He also complains of chronic low back pain that has been treated with morphine. He was previously being seen at [facility] pain management but they no longer take his insurance. He needs a refill today as well as a referral to a to a pain management Center.  Outpatient Prescriptions Prior to Visit  Medication Sig Dispense Refill  . albuterol (PROVENTIL) (2.5 MG/3ML) 0.083% nebulizer solution INHALE 1 VIAL IN NEBULIZER EVERY 4 HOURS AS NEEDED FOR WHEEZING OR SHORTNESS OF BREATH 300 mL 11  . ALPRAZolam (XANAX) 0.25 MG tablet Take 0.25 mg by mouth daily as needed for anxiety.     [name] arformoterol (BROVANA) 15 MCG/2ML NEBU Take 2 mLs (15 mcg total) by nebulization 2 (two) times daily. 8 mL 0  . ARIPiprazole (ABILIFY) 5 MG tablet Take 1 tablet (5 mg total) by mouth at bedtime.    . Armodafinil (NUVIGIL) 150 MG tablet Take 150 mg by mouth every morning.     [name] aspirin 81 MG EC tablet TAKE 1 TABLET EVERY DAY 30 tablet 5  . atorvastatin (LIPITOR) 20 MG tablet TAKE 1 TABLET EVERY DAY 90 tablet 3  . BD PEN NEEDLE NANO U/F 32G X 4 MM MISC 1 EACH BY DOES NOT APPLY ROUTE DAILY. 100 each 3  . benzonatate (TESSALON) 200 MG capsule Take 1 capsule (200 mg total) by mouth 3 (three) times daily as needed for cough. 30 capsule 1  . clonazePAM (KLONOPIN) 1 MG tablet Take 2 mg by mouth at bedtime as needed for anxiety.     . clopidogrel (PLAVIX) 75 MG tablet Take 1 tablet (75 mg total) by mouth daily. 90 tablet 3  . esomeprazole (NEXIUM) 40 MG capsule Take 40 mg by mouth every morning.     [name] FLUoxetine (PROZAC) 40 MG capsule Take 40 mg by mouth every morning.     . Fluticasone-Salmeterol (ADVAIR DISKUS) 500-50 MCG/DOSE AEPB 1 puff then rinse mouth, twice daily maintenance 60 each 11  . gabapentin (NEURONTIN) 100 MG capsule Take 100 mg by mouth daily. Take 1 cap daily    . Insulin Glargine (LANTUS SOLOSTAR) 100 UNIT/ML Solostar Pen 5 units at bedtime. 5 pen PRN  . isosorbide mononitrate (IMDUR) 60 MG 24 hr tablet Take 1 tablet (60 mg total) by mouth daily. 90 tablet 3  . metFORMIN (GLUCOPHAGE-XR) 500 MG 24 hr tablet Take 2 tablets (1,000 mg total) by mouth daily with breakfast. 180 tablet 1  . methocarbamol (ROBAXIN) 500 MG tablet Take 500 mg by mouth 2 (two) times daily as needed for muscle spasms.     . metoprolol succinate (TOPROL-XL) 25 MG 24 hr tablet TAKE 1/2 TABLET BY MOUTH DAILY 30 tablet 3  . mirtazapine (REMERON) 45 MG tablet Take 1 tablet (45 mg total) by mouth at bedtime.    [name] morphine (MSIR) 15 MG tablet Take 1 tablet (15 mg total) by mouth every 4 (four) hours as needed for severe pain. 30 tablet 0  . nitroGLYCERIN (NITROSTAT) 0.4 MG SL tablet Place 1 tablet (0.4 mg total) under the tongue every 5 (five) minutes as needed for chest pain. 25 tablet 3  .  nystatin (MYCOSTATIN) 100000 UNIT/ML suspension Take 5 mLs (500,000 Units total) by mouth 4 (four) times daily. (Patient taking differently: Take 5 mLs by mouth 4 (four) times daily as needed. ) 60 mL 1  . potassium chloride SA (K-DUR,KLOR-CON) 20 MEQ tablet Take 20 mEq by mouth every morning.     . prochlorperazine (COMPAZINE) 10 MG tablet TAKE 1 TABLET BY MOUTH EVERY 6 HOURS AS NEEDED FOR NAUSEA AND VOMITING 60 tablet 0  . STIOLTO RESPIMAT 2.5-2.5 MCG/ACT AERS INHALE 2 PUFFS INTO THE LUNGS DAILY.  5  . theophylline (UNIPHYL) 400 MG 24 hr tablet TAKE 1 TABLET EVERY DAY 30 tablet 5  . VENTOLIN HFA 108 (90 BASE) MCG/ACT inhaler INHALE 2 PUFFS INTO THE LUNGS 4 TIMES DAILY AS NEEDED FOR WHEEZING 18 Inhaler 11  . VOLTAREN 1 % GEL Apply 2 g topically daily as needed (for pain).     [name] morphine (MS  CONTIN) 30 MG 12 hr tablet Take 1 tablet (30 mg total) by mouth every 12 (twelve) hours.    . predniSONE (STERAPRED UNI-PAK 21 TAB) 5 MG (21) TBPK tablet Take 1 tablet (5 mg total) by mouth daily. 1 daily 30 tablet 5  . Suvorexant (BELSOMRA) 10 MG TABS Take 10 mg by mouth at bedtime.    . finasteride (PROSCAR) 5 MG tablet Take 1 tablet (5 mg total) by mouth daily. (Patient not taking: Reported on 10/05/2015) 30 tablet 3   Facility-Administered Medications Prior to Visit  Medication Dose Route Frequency Provider Last Rate Last Dose  . DOBUTamine (DOBUTREX) 1,000 mcg/mL in dextrose 5% 250 mL infusion  0-40 mcg/kg/min Intravenous Continuous Fay Records, MD 224.2 mL/hr at 06/12/15 1117 40 mcg/kg/min at 06/12/15 1117    ROS Review of Systems  Constitutional: Negative.  Negative for fever, chills, diaphoresis, appetite change and fatigue.  HENT: Negative.   Eyes: Negative.   Respiratory: Positive for cough and shortness of breath. Negative for choking, chest tightness, wheezing and stridor.   Cardiovascular: Negative.  Negative for chest pain and leg swelling.  Gastrointestinal: Negative.  Negative for nausea, vomiting, abdominal pain, diarrhea, constipation and blood in stool.  Endocrine: Negative.   Genitourinary: Negative.   Musculoskeletal: Positive for back pain. Negative for myalgias, joint swelling, arthralgias and gait problem.  Skin: Negative.  Negative for rash.  Allergic/Immunologic: Negative.   Neurological: Negative.  Negative for dizziness.  Hematological: Negative.  Negative for adenopathy. Does not bruise/bleed easily.  Psychiatric/Behavioral: Negative.     Objective:  BP 140/70 mmHg  Pulse 67  Temp(Src) 98 F (36.7 C) (Oral)  Resp 20  Ht '5\' 6"'$  (1.676 m)  Wt 209 lb (94.802 kg)  BMI 33.75 kg/m2  SpO2 97%  BP Readings from Last 3 Encounters:  10/05/15 140/70  09/22/15 102/70  09/18/15 132/70    Wt Readings from Last 3 Encounters:  10/05/15 209 lb (94.802 kg)    09/22/15 206 lb 6.4 oz (93.622 kg)  09/18/15 209 lb 3.2 oz (94.892 kg)    Physical Exam  Constitutional: He is oriented to person, place, and time.  Non-toxic appearance. He does not have a sickly appearance. He does not appear ill. No distress.  HENT:  Head: Normocephalic and atraumatic.  Mouth/Throat: Oropharynx is clear and moist. No oropharyngeal exudate.  Eyes: Conjunctivae are normal. Right eye exhibits no discharge. Left eye exhibits no discharge. No scleral icterus.  Neck: Normal range of motion. Neck supple. No JVD present. No tracheal deviation present. No thyromegaly present.  Cardiovascular: Normal  rate, regular rhythm, normal heart sounds and intact distal pulses.  Exam reveals no gallop and no friction rub.   No murmur heard. Pulmonary/Chest: Effort normal. No accessory muscle usage or stridor. No tachypnea. No respiratory distress. He has no decreased breath sounds. He has no wheezes. He has rhonchi in the right middle field, the right lower field, the left middle field and the left lower field. He has no rales. He exhibits no tenderness.  Abdominal: Soft. Bowel sounds are normal. He exhibits no distension and no mass. There is no tenderness. There is no rebound and no guarding.  Musculoskeletal: Normal range of motion. He exhibits no edema or tenderness.  Lymphadenopathy:    He has no cervical adenopathy.  Neurological: He is oriented to person, place, and time.  Skin: Skin is warm and dry. No rash noted. He is not diaphoretic. No erythema. No pallor.  Vitals reviewed.   Lab Results  Component Value Date   WBC 7.4 09/06/2015   HGB 13.5 09/06/2015   HCT 41.0 09/06/2015   PLT 97* 09/06/2015   GLUCOSE 120* 10/05/2015   CHOL 164 10/05/2015   TRIG 174.0* 10/05/2015   HDL 48.70 10/05/2015   LDLDIRECT 71.4 09/08/2012   LDLCALC 80 10/05/2015   ALT 19 09/06/2015   AST 19 09/06/2015   NA 140 10/05/2015   K 4.2 10/05/2015   CL 99 10/05/2015   CREATININE 0.92 10/05/2015    BUN 12 10/05/2015   CO2 35* 10/05/2015   TSH 2.353 08/07/2015   PSA 0.64 07/18/2011   INR 0.91 08/07/2015   HGBA1C 6.2 10/05/2015   MICROALBUR 5.7* 10/05/2015    Ct Chest W Contrast  09/06/2015  CLINICAL DATA:  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 COPD. Shortness of breath on home oxygen. EXAM: CT CHEST WITH CONTRAST TECHNIQUE: Multidetector CT imaging of the chest was performed during intravenous contrast administration. CONTRAST:  41m OMNIPAQUE IOHEXOL 300 MG/ML  SOLN COMPARISON:  04/14/2015 chest CT. FINDINGS: Mediastinum/Nodes: Normal heart size. No pericardial fluid/thickening. There is atherosclerosis of the thoracic aorta, the great vessels of the mediastinum and the coronary arteries, including calcified atherosclerotic plaque in the left anterior descending, left circumflex and right coronary arteries. Stable dilated main pulmonary artery (3.2 cm diameter). Atherosclerotic nonaneurysmal thoracic aorta. No central pulmonary emboli. Normal visualized thyroid. Normal esophagus. No axillary lymphadenopathy. Stable mildly enlarged 1.2 cm subcarinal node (series 2/image 30). No additional pathologically enlarged mediastinal nodes. No right hilar adenopathy. Lungs/Pleura: No pneumothorax. No pleural effusion. Mild centrilobular emphysema. Previously visualized patchy consolidation and ground-glass opacity in the right upper lobe and basilar left lower lobe has resolved. Right upper lobe 3 mm pulmonary nodule (series 5/image 20), not previously visualized. Right upper lobe ground-glass 4 mm pulmonary nodule (5/23), not previously visualized. Posterior right lower lobe 5 mm ground-glass pulmonary nodule (5/37), increased from 2 mm on 04/14/2015. Stable 2 mm right lower lobe pulmonary nodule (5/45). Ground-glass 4 mm left upper lobe pulmonary nodule (5/20), stable. Several left upper lobe 3 mm solid pulmonary  nodule (5/30), stable. Subpleural left lower lobe 5 mm pulmonary nodule (5/42), stable since 01/26/2015. There is stable sharply marginated consolidation with volume loss, surrounding distortion, internal air bronchograms and internal cavitation in the central left upper lobe and superior segment left lower lobe, in keeping with stable post treatment change, with no new solid component or mass to suggest local recurrence. 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 Musculoskeletal: No aggressive appearing focal osseous lesions. 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 There is a healed deformity in the anterior left second rib. Partially visualized is surgical plate with interlocking screws overlying the lower cervical spine. Partially visualized is bilateral posterior spinal fusion hardware in the lumbar spine. Mild degenerative changes are seen in the thoracic spine. IMPRESSION: 1. Stable post treatment change in the central left upper lung, with no evidence of local tumor recurrence. 2. Interval resolution of previously described inflammatory opacity in the right upper and left lower lobes. 3. Mild interval growth of an indeterminate right lower lobe 5 mm ground-glass pulmonary nodule, metastasis not excluded. Two new tiny right upper lobe pulmonary nodules, indeterminate, possibly residual inflammatory foci given the recent infection in this location. 4. Stable mild subcarinal mediastinal lymphadenopathy. 5. Mild centrilobular emphysema. 6. Stable dilated main pulmonary artery suggesting chronic pulmonary arterial hypertension. 7.  Atherosclerosis, including three-vessel coronary artery disease. 8. Healed  anterior left second rib deformity. 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 Electronically Signed   By: Ilona Sorrel M.D.   On: 09/06/2015 12:05    Assessment & Plan:   Elmo was seen today for cough, hypertension, hyperlipidemia and diabetes.  Diagnoses and all orders for this visit:  Essential hypertension- his BP is well controlled, lytes and renal function are stable -     Basic metabolic panel; Future -     Urinalysis, Routine w reflex microscopic (not at [facility]); Future  Diabetes mellitus without complication ([place])- his blood sugars are well controlled, renal function is stable -     Basic metabolic panel; Future -     Lipid panel; Future -     Hemoglobin A1c; Future -     Microalbumin / creatinine urine ratio; Future  Hyperlipidemia with target LDL less than 70- he has achieved his LDL goal and is doing well on a statin -     Lipid panel; Future -     Cancel: TSH; Future  Chronic pain syndrome -     Ambulatory referral to Pain Clinic -     morphine (MS CONTIN) 30 MG 12 hr tablet; Take 1 tablet (30 mg total) by mouth every 12 (twelve) hours.  Cough- he has a history of squamous cell carcinoma the lungs but his chest x-ray today is unchanged. -     DG Chest 2 View; Future  COPD with exacerbation ([place])- he is already taking steroids so will treat for infectious causes with Augmentin. -     amoxicillin-clavulanate (AUGMENTIN) 875-125 MG tablet; Take 1 tablet by mouth 2 (two) times daily.   I have discontinued Mr. Curtiss's finasteride and Suvorexant. I am also having him start on amoxicillin-clavulanate. Additionally, I am having him maintain his clonazePAM, VOLTAREN, potassium chloride SA, ARIPiprazole, methocarbamol, Armodafinil, Insulin Glargine, ALPRAZolam, esomeprazole, nitroGLYCERIN, isosorbide mononitrate, mirtazapine, Fluticasone-Salmeterol, albuterol, nystatin, VENTOLIN HFA, morphine, BD PEN  NEEDLE NANO U/F, metFORMIN, FLUoxetine, aspirin, gabapentin, metoprolol succinate, atorvastatin, clopidogrel, STIOLTO RESPIMAT, theophylline, prochlorperazine, benzonatate, arformoterol, BELSOMRA, predniSONE, tamsulosin, and morphine.  Meds ordered this encounter  Medications  . BELSOMRA 20 MG TABS    Sig: Take 1 tablet by mouth at bedtime.    Refill:  4  . predniSONE (  DELTASONE) 5 MG tablet    Sig: Take 5 mg by mouth daily.    Refill:  5  . tamsulosin (FLOMAX) 0.4 MG CAPS capsule    Sig: TAKE 1 CAPSULE (0.4 MG TOTAL) BY MOUTH DAILY.    Refill:  3  . amoxicillin-clavulanate (AUGMENTIN) 875-125 MG tablet    Sig: Take 1 tablet by mouth 2 (two) times daily.    Dispense:  20 tablet    Refill:  0  . morphine (MS CONTIN) 30 MG 12 hr tablet    Sig: Take 1 tablet (30 mg total) by mouth every 12 (twelve) hours.    Dispense:  60 tablet    Refill:  0     Follow-up: Return in about 3 weeks (around 10/26/2015).  [name], MD

## 2015-10-05 NOTE — Progress Notes (Signed)
Pre visit review using our clinic review tool, if applicable. No additional management support is needed unless otherwise documented below in the visit note. 

## 2015-10-09 ENCOUNTER — Encounter: Payer: Self-pay | Admitting: Internal Medicine

## 2015-10-16 ENCOUNTER — Other Ambulatory Visit: Payer: Self-pay | Admitting: *Deleted

## 2015-10-16 DIAGNOSIS — E119 Type 2 diabetes mellitus without complications: Secondary | ICD-10-CM

## 2015-10-16 NOTE — Patient Outreach (Signed)
[facility]) Care Management  10/16/2015  May Ozment Carrender 11/27/1955 [id number]   Referral from [place] 4 list:  Subjective: Telephone call to patient who gave HIPPA verification.  Patient states just recently started going to new primary care provider. Had recent appointment in Sept 2016. Patient states main health concern currently is diabetes mellitus. States he is self administering insulin and checking blood sugars weekly. States numbers have been under 250 thirty minutes after eating. States not recording blood sugar values and  does not know what low blood sugar really is. Has no knowledge of indications of hemoglobin A1c level. States he has not had any diabetic teaching since he started taking insulin 2 years ago and would like to learn more about diabetes and self care. Patient consents to [facility] care management services.  Objective: See screening and diabetes assessment as noted.  Assessment: Knowledge deficit regarding diabetes mellitus as it relates to self care. Managing own medications. Knows how to utilize COPD action plan. Patient is independent but does have support from spouse as needed. Consents to [facility] care management services.  Plan: Refer to care management assistant to refer to Health Coach. Telephonic signing off. Sherrin Daisy, RN BSN [place] Management Coordinator [facility] Care Management  [phone]

## 2015-10-19 DIAGNOSIS — J449 Chronic obstructive pulmonary disease, unspecified: Secondary | ICD-10-CM | POA: Diagnosis not present

## 2015-10-23 ENCOUNTER — Other Ambulatory Visit: Payer: Self-pay | Admitting: *Deleted

## 2015-10-23 DIAGNOSIS — E0810 Diabetes mellitus due to underlying condition with ketoacidosis without coma: Secondary | ICD-10-CM

## 2015-10-24 ENCOUNTER — Encounter: Payer: Self-pay | Admitting: *Deleted

## 2015-10-24 ENCOUNTER — Other Ambulatory Visit: Payer: Self-pay | Admitting: *Deleted

## 2015-10-24 NOTE — Patient Outreach (Addendum)
[facility]) Care Management  10/23/2015   Ernest [name] Jul 02, 1956 [id number]  RN Health Coach telephone call to patient.  [place] compliance verified. Per patient he is a diabetic on insulin. Per patient he checks his blood sugar once a week. Patient stated he didn't know to check it more  often. Per patient he doesn't  know the signs and symptoms of hypo and hyperglycemia. Per patient he stated he has had times when he felt  A little shaky.  RN Health coach asked if he checked his blood sugars he said no. Per lab notes the patient Hgb A1C is 6.2 on 10/05/2015.  Per patient he is not on a special diet. Patient stated he drinks supplemental nutrition Ensure.  RN Health Coach explained to the patient about the Glucerna supplements. Patient stated he was diagnosed 2 years ago and is currently on insulin and oral agents. Per patient his last blood sugar was 260. Patient stated he has not had any outpatient diabetic teaching. Patient has agreed to [employer] follow up outreach calls.    Assessment: Knowledge deficit in self management of diabetes Patient will benefit from Health Coach telephonic outreach for education and support for Diabetes self management Patient would benefit from referral  for Diabetic Nutritional Classes Patient would benefit from community nurse referral.  [place] will provide ongoing education for patient on diabetes through phone calls and sending printed information to patient for further discussion. RN Health coach will send welcome packet with consent to patient as well as printed educational information on diabetes. RN Health Coach will send initial barriers letter, assessment, and care plan to primary care physician.  RN will request from physician a referral for nutritional classes RN Health Coach will send EMMI Information on Diabetes low blood sugar self care RN Health Coach will send EMMI Information on High Blood [place] will send EMMI Information on [place] will send EMMI Information on WHEN TO [place] will send EMMI Information on WHY TO [place] will send EMMI Information on [place] will send EMMI Information on [place] will send EMMI Information on [place] will send EMMI Information on [place] RN will refer patient to community nurse [place] will follow up  Outreach within a month with patient RN Health Coach sent a calendar book [name] sent a Carb Chart and Carb Counting and meal planning booklet  [place] Management [phone]

## 2015-10-25 DIAGNOSIS — J449 Chronic obstructive pulmonary disease, unspecified: Secondary | ICD-10-CM | POA: Diagnosis not present

## 2015-10-25 DIAGNOSIS — J9601 Acute respiratory failure with hypoxia: Secondary | ICD-10-CM | POA: Diagnosis not present

## 2015-10-25 DIAGNOSIS — J9612 Chronic respiratory failure with hypercapnia: Secondary | ICD-10-CM | POA: Diagnosis not present

## 2015-10-25 DIAGNOSIS — C342 Malignant neoplasm of middle lobe, bronchus or lung: Secondary | ICD-10-CM | POA: Diagnosis not present

## 2015-10-31 ENCOUNTER — Encounter: Payer: Self-pay | Admitting: Internal Medicine

## 2015-10-31 ENCOUNTER — Ambulatory Visit (INDEPENDENT_AMBULATORY_CARE_PROVIDER_SITE_OTHER): Payer: Commercial Managed Care - HMO | Admitting: Internal Medicine

## 2015-10-31 VITALS — BP 140/82 | HR 68 | Temp 98.2°F | Resp 20 | Ht 66.0 in | Wt 207.0 lb

## 2015-10-31 DIAGNOSIS — J449 Chronic obstructive pulmonary disease, unspecified: Secondary | ICD-10-CM

## 2015-10-31 DIAGNOSIS — E119 Type 2 diabetes mellitus without complications: Secondary | ICD-10-CM

## 2015-10-31 DIAGNOSIS — J45909 Unspecified asthma, uncomplicated: Secondary | ICD-10-CM | POA: Diagnosis not present

## 2015-10-31 DIAGNOSIS — G894 Chronic pain syndrome: Secondary | ICD-10-CM | POA: Diagnosis not present

## 2015-10-31 MED ORDER — MORPHINE SULFATE ER 30 MG PO TBCR: 30.0000 mg | EXTENDED_RELEASE_TABLET | Freq: Two times a day (BID) | ORAL | Status: DC

## 2015-10-31 MED ORDER — MORPHINE SULFATE 15 MG PO TABS: 15.0000 mg | ORAL_TABLET | ORAL | Status: DC | PRN

## 2015-10-31 NOTE — Assessment & Plan Note (Signed)
Improvement noted 

## 2015-10-31 NOTE — Patient Instructions (Signed)

## 2015-10-31 NOTE — Progress Notes (Signed)
Pre visit review using our clinic review tool, if applicable. No additional management support is needed unless otherwise documented below in the visit note. 

## 2015-10-31 NOTE — Progress Notes (Signed)
Subjective:  Patient ID: Julianne Handler, male    DOB: [birth year]  Age: 59 y.o. MRN: [id number]  CC: COPD   HPI Alexzavier Girardin Granito presents for follow-up after recent exacerbation of COPD. His cough and shortness of breath are much improved. He has chronic, unchanged low back pain. He needs a refill on morphine sulfate. He also wants to know if he can stop taking his medications for diabetes - metformin and Lantus.  Outpatient Prescriptions Prior to Visit  Medication Sig Dispense Refill  . albuterol (PROVENTIL) (2.5 MG/3ML) 0.083% nebulizer solution INHALE 1 VIAL IN NEBULIZER EVERY 4 HOURS AS NEEDED FOR WHEEZING OR SHORTNESS OF BREATH 300 mL 11  . ALPRAZolam (XANAX) 0.25 MG tablet Take 0.25 mg by mouth daily as needed for anxiety.     [name] arformoterol (BROVANA) 15 MCG/2ML NEBU Take 2 mLs (15 mcg total) by nebulization 2 (two) times daily. 8 mL 0  . ARIPiprazole (ABILIFY) 5 MG tablet Take 1 tablet (5 mg total) by mouth at bedtime.    . Armodafinil (NUVIGIL) 150 MG tablet Take 150 mg by mouth every morning.     [name] aspirin 81 MG EC tablet TAKE 1 TABLET EVERY DAY 30 tablet 5  . atorvastatin (LIPITOR) 20 MG tablet TAKE 1 TABLET EVERY DAY 90 tablet 3  . BD PEN NEEDLE NANO U/F 32G X 4 MM MISC 1 EACH BY DOES NOT APPLY ROUTE DAILY. 100 each 3  . clonazePAM (KLONOPIN) 1 MG tablet Take 2 mg by mouth at bedtime as needed for anxiety.     . clopidogrel (PLAVIX) 75 MG tablet Take 1 tablet (75 mg total) by mouth daily. 90 tablet 3  . esomeprazole (NEXIUM) 40 MG capsule Take 40 mg by mouth every morning.     [name] FLUoxetine (PROZAC) 40 MG capsule Take 40 mg by mouth every morning.    . Fluticasone-Salmeterol (ADVAIR DISKUS) 500-50 MCG/DOSE AEPB 1 puff then rinse mouth, twice daily maintenance 60 each 11  . gabapentin (NEURONTIN) 100 MG capsule Take 100 mg by mouth daily. Take 1 cap daily    . isosorbide mononitrate (IMDUR) 60 MG 24 hr tablet Take 1 tablet (60 mg total) by mouth daily. 90 tablet 3  .  methocarbamol (ROBAXIN) 500 MG tablet Take 500 mg by mouth 2 (two) times daily as needed for muscle spasms.     . metoprolol succinate (TOPROL-XL) 25 MG 24 hr tablet TAKE 1/2 TABLET BY MOUTH DAILY 30 tablet 3  . mirtazapine (REMERON) 45 MG tablet Take 1 tablet (45 mg total) by mouth at bedtime.    . nitroGLYCERIN (NITROSTAT) 0.4 MG SL tablet Place 1 tablet (0.4 mg total) under the tongue every 5 (five) minutes as needed for chest pain. 25 tablet 3  . nystatin (MYCOSTATIN) 100000 UNIT/ML suspension Take 5 mLs (500,000 Units total) by mouth 4 (four) times daily. (Patient taking differently: Take 5 mLs by mouth 4 (four) times daily as needed. ) 60 mL 1  . potassium chloride SA (K-DUR,KLOR-CON) 20 MEQ tablet Take 20 mEq by mouth every morning.     . prochlorperazine (COMPAZINE) 10 MG tablet TAKE 1 TABLET BY MOUTH EVERY 6 HOURS AS NEEDED FOR NAUSEA AND VOMITING 60 tablet 0  . STIOLTO RESPIMAT 2.5-2.5 MCG/ACT AERS INHALE 2 PUFFS INTO THE LUNGS DAILY.  5  . theophylline (UNIPHYL) 400 MG 24 hr tablet TAKE 1 TABLET EVERY DAY 30 tablet 5  . VENTOLIN HFA 108 (90 BASE) MCG/ACT inhaler INHALE 2 PUFFS INTO THE  LUNGS 4 TIMES DAILY AS NEEDED FOR WHEEZING 18 Inhaler 11  . VOLTAREN 1 % GEL Apply 2 g topically daily as needed (for pain).     . BELSOMRA 20 MG TABS Take 1 tablet by mouth at bedtime.  4  . Insulin Glargine (LANTUS SOLOSTAR) 100 UNIT/ML Solostar Pen 5 units at bedtime. 5 pen PRN  . metFORMIN (GLUCOPHAGE-XR) 500 MG 24 hr tablet Take 2 tablets (1,000 mg total) by mouth daily with breakfast. 180 tablet 1  . morphine (MS CONTIN) 30 MG 12 hr tablet Take 1 tablet (30 mg total) by mouth every 12 (twelve) hours. 60 tablet 0  . morphine (MSIR) 15 MG tablet Take 1 tablet (15 mg total) by mouth every 4 (four) hours as needed for severe pain. 30 tablet 0  . predniSONE (DELTASONE) 5 MG tablet Take 5 mg by mouth daily.  5  . tamsulosin (FLOMAX) 0.4 MG CAPS capsule TAKE 1 CAPSULE (0.4 MG TOTAL) BY MOUTH DAILY.  3  .  benzonatate (TESSALON) 200 MG capsule Take 1 capsule (200 mg total) by mouth 3 (three) times daily as needed for cough. (Patient not taking: Reported on 10/31/2015) 30 capsule 1   Facility-Administered Medications Prior to Visit  Medication Dose Route Frequency Provider Last Rate Last Dose  . DOBUTamine (DOBUTREX) 1,000 mcg/mL in dextrose 5% 250 mL infusion  0-40 mcg/kg/min Intravenous Continuous Fay Records, MD 224.2 mL/hr at 06/12/15 1117 40 mcg/kg/min at 06/12/15 1117    ROS Review of Systems  Constitutional: Negative.  Negative for fever, chills, diaphoresis, appetite change and fatigue.  HENT: Negative.  Negative for congestion, sinus pressure, sore throat and trouble swallowing.   Eyes: Negative.   Respiratory: Positive for cough and shortness of breath. Negative for apnea, choking, chest tightness, wheezing and stridor.   Cardiovascular: Negative.  Negative for chest pain, palpitations and leg swelling.  Gastrointestinal: Negative.  Negative for nausea, vomiting, abdominal pain, diarrhea, constipation and blood in stool.  Endocrine: Negative.  Negative for polydipsia, polyphagia and polyuria.  Genitourinary: Negative.   Musculoskeletal: Positive for back pain. Negative for myalgias, joint swelling, arthralgias and neck pain.  Skin: Negative.   Allergic/Immunologic: Negative.   Neurological: Negative.  Negative for dizziness, weakness, light-headedness and numbness.  Hematological: Negative.  Negative for adenopathy. Does not bruise/bleed easily.  Psychiatric/Behavioral: Negative.     Objective:  BP 140/82 mmHg  Pulse 68  Temp(Src) 98.2 F (36.8 C) (Oral)  Resp 20  Ht '5\' 6"'$  (1.676 m)  Wt 207 lb (93.895 kg)  BMI 33.43 kg/m2  SpO2 95%  BP Readings from Last 3 Encounters:  10/31/15 140/82  10/05/15 140/70  09/22/15 102/70    Wt Readings from Last 3 Encounters:  10/31/15 207 lb (93.895 kg)  10/05/15 209 lb (94.802 kg)  09/22/15 206 lb 6.4 oz (93.622 kg)    Physical  Exam  Constitutional: He is oriented to person, place, and time.  Non-toxic appearance. He does not have a sickly appearance. He does not appear ill. No distress.  HENT:  Mouth/Throat: Oropharynx is clear and moist. No oropharyngeal exudate.  Eyes: Conjunctivae are normal. Right eye exhibits no discharge. Left eye exhibits no discharge. No scleral icterus.  Neck: Normal range of motion. Neck supple. No JVD present. No tracheal deviation present. No thyromegaly present.  Cardiovascular: Normal rate, regular rhythm, normal heart sounds and intact distal pulses.  Exam reveals no gallop and no friction rub.   No murmur heard. Pulmonary/Chest: Effort normal and breath sounds normal.  No stridor. No respiratory distress. He has no wheezes. He has no rales. He exhibits no tenderness.  Abdominal: Soft. Bowel sounds are normal. He exhibits no distension and no mass. There is no tenderness. There is no rebound and no guarding.  Musculoskeletal: Normal range of motion. He exhibits no edema or tenderness.  Lymphadenopathy:    He has no cervical adenopathy.  Neurological: He is oriented to person, place, and time.  Skin: Skin is warm and dry. No rash noted. He is not diaphoretic. No erythema. No pallor.  Psychiatric: He has a normal mood and affect. His behavior is normal. Judgment and thought content normal.  Vitals reviewed.   Lab Results  Component Value Date   WBC 7.4 09/06/2015   HGB 13.5 09/06/2015   HCT 41.0 09/06/2015   PLT 97* 09/06/2015   GLUCOSE 120* 10/05/2015   CHOL 164 10/05/2015   TRIG 174.0* 10/05/2015   HDL 48.70 10/05/2015   LDLDIRECT 71.4 09/08/2012   LDLCALC 80 10/05/2015   ALT 19 09/06/2015   AST 19 09/06/2015   NA 140 10/05/2015   K 4.2 10/05/2015   CL 99 10/05/2015   CREATININE 0.92 10/05/2015   BUN 12 10/05/2015   CO2 35* 10/05/2015   TSH 2.353 08/07/2015   PSA 0.64 07/18/2011   INR 0.91 08/07/2015   HGBA1C 6.2 10/05/2015   MICROALBUR 5.7* 10/05/2015    Dg  Chest 2 View  10/05/2015  CLINICAL DATA:  Cough and congestion. EXAM: CHEST  2 VIEW COMPARISON:  CT 09/06/2015.  Chest x-ray 07/14/2015. FINDINGS: Mediastinum and hilar structures stable. Left apical pleural parenchymal thickening again noted consistent with scarring. Associated left hilar retraction. No focal infiltrate. Cardiomegaly. No pleural effusion or pneumothorax. Stable cardiomegaly. No pulmonary venous congestion. Prior cervical spine fusion. IMPRESSION: 1. Stable left apical pleural-parenchymal thickening consistent with scarring. 2. Stable cardiomegaly. 3. No acute pulmonary disease. Electronically Signed   By: Marcello Moores  Register   On: 10/05/2015 10:14    Assessment & Plan:   Cam was seen today for copd.  Diagnoses and all orders for this visit:  Diabetes mellitus without complication ([place])- his F0Y is down to 6.2. I have advised him that he can discontinue metformin and Lantus. We'll recheck his A1c in about 4 months. -     Ambulatory referral to Ophthalmology  Chronic pain syndrome -     morphine (MS CONTIN) 30 MG 12 hr tablet; Take 1 tablet (30 mg total) by mouth every 12 (twelve) hours. -     morphine (MSIR) 15 MG tablet; Take 1 tablet (15 mg total) by mouth every 4 (four) hours as needed for severe pain.   I have discontinued Mr. Ciccone's Insulin Glargine, metFORMIN, benzonatate, and BELSOMRA. I am also having him maintain his clonazePAM, VOLTAREN, potassium chloride SA, ARIPiprazole, methocarbamol, Armodafinil, ALPRAZolam, esomeprazole, nitroGLYCERIN, isosorbide mononitrate, mirtazapine, Fluticasone-Salmeterol, albuterol, nystatin, VENTOLIN HFA, BD PEN NEEDLE NANO U/F, FLUoxetine, aspirin, gabapentin, metoprolol succinate, atorvastatin, clopidogrel, STIOLTO RESPIMAT, theophylline, prochlorperazine, arformoterol, tamsulosin, predniSONE, finasteride, morphine, and morphine.  Meds ordered this encounter  Medications  . predniSONE (DELTASONE) 5 MG tablet    Sig: Take 5 mg  by mouth daily.    Refill:  5  . finasteride (PROSCAR) 5 MG tablet    Sig:   . morphine (MS CONTIN) 30 MG 12 hr tablet    Sig: Take 1 tablet (30 mg total) by mouth every 12 (twelve) hours.    Dispense:  60 tablet    Refill:  0  .  morphine (MSIR) 15 MG tablet    Sig: Take 1 tablet (15 mg total) by mouth every 4 (four) hours as needed for severe pain.    Dispense:  30 tablet    Refill:  0     Follow-up: Return in about 4 months (around 02/29/2016).  [name], MD

## 2015-11-01 ENCOUNTER — Other Ambulatory Visit: Payer: Self-pay | Admitting: Internal Medicine

## 2015-11-03 DIAGNOSIS — J449 Chronic obstructive pulmonary disease, unspecified: Secondary | ICD-10-CM | POA: Diagnosis not present

## 2015-11-09 ENCOUNTER — Ambulatory Visit (INDEPENDENT_AMBULATORY_CARE_PROVIDER_SITE_OTHER): Payer: Commercial Managed Care - HMO | Admitting: Cardiology

## 2015-11-09 ENCOUNTER — Encounter: Payer: Self-pay | Admitting: Cardiology

## 2015-11-09 VITALS — BP 132/64 | HR 72 | Ht 66.0 in | Wt 206.1 lb

## 2015-11-09 DIAGNOSIS — I2510 Atherosclerotic heart disease of native coronary artery without angina pectoris: Secondary | ICD-10-CM | POA: Diagnosis not present

## 2015-11-09 NOTE — Patient Instructions (Signed)
Your physician wants you to follow-up in: 1 Year. You will receive a reminder letter in the mail two months in advance. If you don't receive a letter, please call our office to schedule the follow-up appointment.  Merry Christmas and Happy New Year!!!

## 2015-11-09 NOTE — Progress Notes (Signed)
HPI The patient presents for follow of known coronary disease.  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 Currently he is having some small nodules followed.   Since I last saw him he's had PTCA of the right common iliac occlusion allergies had much improvement in claudication. He stopped smoking ! He has chronic dyspnea but he has no new shortness of breath, PND or orthopnea. He's had no chest pressure, neck or arm discomfort. She's had no palpitations, presyncope or syncope.er is under surveillance and has not progressed.   Allergies  Allergen Reactions  . Carboplatin Shortness Of Breath, Swelling and Rash    Swelling of lips, rash on face,eyes and head    Current Outpatient Prescriptions  Medication Sig Dispense Refill  . albuterol (PROVENTIL) (2.5 MG/3ML) 0.083% nebulizer solution INHALE 1 VIAL IN NEBULIZER EVERY 4 HOURS AS NEEDED FOR WHEEZING OR SHORTNESS OF BREATH 300 mL 11  . ALPRAZolam (XANAX) 0.25 MG tablet Take 0.25 mg by mouth daily as needed for anxiety.     [name] arformoterol (BROVANA) 15 MCG/2ML NEBU Take 2 mLs (15 mcg total) by nebulization 2 (two) times daily. 8 mL 0  . ARIPiprazole (ABILIFY) 5 MG tablet Take 1 tablet (5 mg total) by mouth at bedtime.    . Armodafinil (NUVIGIL) 150 MG tablet Take 150 mg by mouth every morning.     [name] aspirin 81 MG EC tablet TAKE 1 TABLET EVERY DAY 30 tablet 5  . atorvastatin (LIPITOR) 20 MG tablet TAKE 1 TABLET EVERY DAY 90 tablet 3  . benzonatate (TESSALON) 200 MG capsule TAKE 1 CAPSULE (200 MG TOTAL) BY MOUTH 3 (THREE) TIMES DAILY AS NEEDED FOR COUGH. 30 capsule 1  . clonazePAM (KLONOPIN) 1 MG tablet Take 2 mg by mouth at bedtime as needed for anxiety.     . clopidogrel (PLAVIX) 75 MG tablet Take 1 tablet (75 mg total) by mouth daily. 90 tablet 3  . esomeprazole (NEXIUM) 40 MG capsule Take 40 mg by mouth every morning.     . finasteride (PROSCAR) 5 MG tablet       . FLUoxetine (PROZAC) 40 MG capsule Take 40 mg by mouth every morning.    . Fluticasone-Salmeterol (ADVAIR DISKUS) 500-50 MCG/DOSE AEPB 1 puff then rinse mouth, twice daily maintenance 60 each 11  . gabapentin (NEURONTIN) 100 MG capsule Take 100 mg by mouth daily. Take 1 cap daily    . isosorbide mononitrate (IMDUR) 60 MG 24 hr tablet Take 1 tablet (60 mg total) by mouth daily. 90 tablet 3  . methocarbamol (ROBAXIN) 500 MG tablet Take 500 mg by mouth 2 (two) times daily as needed for muscle spasms.     . metoprolol succinate (TOPROL-XL) 25 MG 24 hr tablet TAKE 1/2 TABLET BY MOUTH DAILY 30 tablet 3  . mirtazapine (REMERON) 45 MG tablet Take 1 tablet (45 mg total) by mouth at bedtime.    [name] morphine (MS CONTIN) 30 MG 12 hr tablet Take 1 tablet (30 mg total) by mouth every 12 (twelve) hours. 60 tablet 0  . morphine (MSIR) 15 MG tablet Take 1 tablet (15 mg total) by mouth every 4 (four) hours as needed for severe pain. 30 tablet 0  . nitroGLYCERIN (NITROSTAT) 0.4 MG SL tablet Place 1 tablet (0.4 mg total) under the tongue every 5 (five) minutes as needed for chest pain. 25 tablet 3  . potassium chloride SA (K-DUR,KLOR-CON) 20  MEQ tablet Take 20 mEq by mouth every morning.     . predniSONE (DELTASONE) 5 MG tablet Take 5 mg by mouth daily.  5  . prochlorperazine (COMPAZINE) 10 MG tablet TAKE 1 TABLET BY MOUTH EVERY 6 HOURS AS NEEDED FOR NAUSEA AND VOMITING 60 tablet 0  . STIOLTO RESPIMAT 2.5-2.5 MCG/ACT AERS INHALE 2 PUFFS INTO THE LUNGS DAILY.  5  . VENTOLIN HFA 108 (90 BASE) MCG/ACT inhaler INHALE 2 PUFFS INTO THE LUNGS 4 TIMES DAILY AS NEEDED FOR WHEEZING 18 Inhaler 11  . VOLTAREN 1 % GEL Apply 2 g topically daily as needed (for pain).      No current facility-administered medications for this visit.   Facility-Administered Medications Ordered in Other Visits  Medication Dose Route Frequency Provider Last Rate Last Dose  . DOBUTamine (DOBUTREX) 1,000 mcg/mL in dextrose 5% 250 mL infusion  0-40  mcg/kg/min Intravenous Continuous Fay Records, MD 224.2 mL/hr at 06/12/15 1117 40 mcg/kg/min at 06/12/15 1117    Past Medical History  Diagnosis Date  . CAD (coronary artery disease)     Left Main 30% stenosis, LAD 20 - 30 % stenosis, first and second diagonal branchesat 40 - 50%  stenosis with small arteries, circumflex had 30% stenosis in the large obtuse marginal, RCA at 70 - 80%  stenosis [not felt to be occlusive after evaluation with flow wire], distal 50 - 60% stenosis - [facility] Cervantez[  . COPD (chronic obstructive pulmonary disease) (HCC)     Dr. Baird Lyons  . Depression   . Anxiety   . Hyperlipidemia   . Chronic insomnia   . Gout   . GERD (gastroesophageal reflux disease)   . PVD (peripheral vascular disease) ([facility]CC)     (ABI 0.9 on right and 0.89 on left)  severe left external iliac stenosis.  He  had successful stenting of his left external iliac per Dr. Burt Knack.07/2015 overlapping ICast stents to the R-CIA  . DDD (degenerative disc disease)   . Hx of colonoscopy   . COPD with asthma ([place]) 09/08/2007  . OSA (obstructive sleep apnea)     NPSG 09/10/10- AHI 11.3/hr  . Hypertension     dr Percival Spanish  . History of radiation therapy 11/10/13- 12/29/13    left lung 6600 cGy in 33 sessions  . Diabetes mellitus without complication ([place]) 7/62/8315  . Cancer ([place])     lung  . Lung cancer ([place]) 10/04/13    LUL squamous cell lung cancer  . History of cardiovascular stress test     Myoview 7/16:  Diaphragmatic attenuation, no ischemia, EF 56%; Low Risk    Past Surgical History  Procedure Laterality Date  . Spinal fusion  03/05/2007    L4-L5  . Hip surgery      left 'bone graft taken"  . Arm surgery      left elbow  . Shoulder surgery      right  . C-spine surgery    . Angioplasty    . Video bronchoscopy with endobronchial navigation N/A 10/04/2013    Procedure: VIDEO BRONCHOSCOPY WITH ENDOBRONCHIAL NAVIGATION;  Surgeon: Collene Gobble, MD;  Location: [place];  Service:  Thoracic;  Laterality: N/A;  . Back surgery      lower  . Anterior fusion cervical spine      cervical fusion  7 yrs ago ([facility])  . Colon surgery      '11 "Diverticulitis"  . Colonoscopy with propofol N/A 05/22/2015    Procedure: COLONOSCOPY WITH PROPOFOL;  Surgeon: Inda Castle, MD;  Location: [facility];  Service: Endoscopy;  Laterality: N/A;  . Peripheral vascular catheterization N/A 08/10/2015    Procedure: Lower Extremity Angiography;  Surgeon: Lorretta Harp, MD; Distal Ao OK, L-EIA stent OK, R-CIA 100% s/p overlapping 7 mm x 38 mm ICast stents, 50-60% R-CFA        ROS:    As stated in the HPI and negative for all other systems.  PHYSICAL EXAM BP 132/64 mmHg  Pulse 72  Ht '5\' 6"'$  (1.676 m)  Wt 206 lb 1.6 oz (93.486 kg)  BMI 33.28 kg/m2 GENERAL:  Well appearing HEENT:  Pupils equal round and reactive, fundi not visualized, oral mucosa unremarkable, dentures NECK:  No jugular venous distention, waveform within normal limits, carotid upstroke brisk and symmetric, no bruits, no thyromegaly LYMPHATICS:  No cervical, inguinal adenopathy LUNGS:  Decreased breath sounds bilaterally with expiratory wheezing BACK:  No CVA tenderness CHEST:  Unremarkable HEART:  PMI not displaced or sustained,S1 and S2 within normal limits, no S3, no S4, no clicks, no rubs, no murmurs ABD:  Flat, positive bowel sounds normal in frequency in pitch, no bruits, no rebound, no guarding, no midline pulsatile mass, no hepatomegaly, no splenomegaly EXT:  2 plus pulses upper, DP/PT left 2 plus,  2+ right posterior tibialis ,  no edema, no cyanosis no clubbing SKIN:  No rashes no nodules    ASSESSMENT AND PLAN  CLAUDICATION -  I reviewed the records from his hospitalization and his procedure. I note Dr. Gwenlyn Found and we'll stop the Plavix if this is okay with Dr. Gwenlyn Found. He will continue his aspirin. He is much improved.  CORONARY ATHEROSCLEROSIS NATIVE CORONARY ARTERY -  The patient has no new symptoms since  his stress test earlier this year.[name] No change in therapy is indicated.  HYPERTENSION -  His blood pressure is controlled and he will continue the meds as listed.  HYPERCHOLESTEROLEMIA -  His lipids are okay. He'll continue the meds as listed.  Lab Results  Component Value Date   CHOL 164 10/05/2015   TRIG 174.0* 10/05/2015   HDL 48.70 10/05/2015   LDLCALC 80 10/05/2015   LDLDIRECT 71.4 09/08/2012   TOBACCO - He stopped smoking!

## 2015-11-16 ENCOUNTER — Telehealth: Payer: Self-pay | Admitting: *Deleted

## 2015-11-16 NOTE — Telephone Encounter (Signed)
Leave message on pt own voicemail for pt to stop plavix and start an ASA as per Dr Percival Spanish

## 2015-11-16 NOTE — Telephone Encounter (Signed)
-----   Message from Minus Breeding, MD sent at 11/14/2015  1:31 PM EST ----- Please call him and tell him OK to stop Plavix.  Make sure that he is taking ASA.    ----- Message -----    From: Lorretta Harp, MD    Sent: 11/14/2015  11:49 AM      To: Minus Breeding, MD  OK to stop plavix of no need to be on it for another reason.  JJB  ----- Message -----    From: Minus Breeding, MD    Sent: 11/09/2015  10:59 AM      To: Lorretta Harp, MD  Can I stop the Plavix.

## 2015-11-18 DIAGNOSIS — J449 Chronic obstructive pulmonary disease, unspecified: Secondary | ICD-10-CM | POA: Diagnosis not present

## 2015-11-22 ENCOUNTER — Telehealth: Payer: Self-pay | Admitting: Internal Medicine

## 2015-11-22 ENCOUNTER — Other Ambulatory Visit: Payer: Self-pay | Admitting: *Deleted

## 2015-11-22 DIAGNOSIS — G4733 Obstructive sleep apnea (adult) (pediatric): Secondary | ICD-10-CM

## 2015-11-22 NOTE — Telephone Encounter (Signed)
Pt would like to have CY send order to [facility] to pick up Trilogy as patient is not using (not able to tolerate) and its costing him to much each month to keep at his house. Pt stated he is wearing his CPAP machine each night and doing well.     CY Please advise.

## 2015-11-22 NOTE — Patient Outreach (Signed)
[facility]) Care Management  11/22/2015  Ernest [name] August 14, 1956 [id number]   RN CM attempted  Follow up outreach call to patient.  Patient was unavailable. HIPPA compliance voicemail message was left with return callback number.    [facility] [phone]

## 2015-11-22 NOTE — Telephone Encounter (Signed)
Called and spoke with patient. Informed him of CY's recs. Order has been placed. Pt voiced understanding and had no further questions. Nothing further needed.

## 2015-11-22 NOTE — Telephone Encounter (Signed)
Please order DME to dc Trilogy and continue CPAP per patient request

## 2015-11-24 ENCOUNTER — Encounter (HOSPITAL_COMMUNITY): Payer: Self-pay | Admitting: *Deleted

## 2015-11-24 ENCOUNTER — Emergency Department (HOSPITAL_COMMUNITY): Payer: Commercial Managed Care - HMO

## 2015-11-24 ENCOUNTER — Inpatient Hospital Stay (HOSPITAL_COMMUNITY)
Admission: EM | Admit: 2015-11-24 | Discharge: 2015-11-30 | DRG: 871 | Disposition: A | Discharge status: Home / Self Care | Payer: Commercial Managed Care - HMO | Attending: Internal Medicine | Admitting: Internal Medicine

## 2015-11-24 DIAGNOSIS — J440 Chronic obstructive pulmonary disease with acute lower respiratory infection: Secondary | ICD-10-CM | POA: Diagnosis present

## 2015-11-24 DIAGNOSIS — F329 Major depressive disorder, single episode, unspecified: Secondary | ICD-10-CM | POA: Diagnosis present

## 2015-11-24 DIAGNOSIS — M109 Gout, unspecified: Secondary | ICD-10-CM | POA: Diagnosis present

## 2015-11-24 DIAGNOSIS — J9601 Acute respiratory failure with hypoxia: Secondary | ICD-10-CM | POA: Diagnosis not present

## 2015-11-24 DIAGNOSIS — J154 Pneumonia due to other streptococci: Secondary | ICD-10-CM | POA: Diagnosis present

## 2015-11-24 DIAGNOSIS — A409 Streptococcal sepsis, unspecified: Principal | ICD-10-CM | POA: Diagnosis present

## 2015-11-24 DIAGNOSIS — Z9221 Personal history of antineoplastic chemotherapy: Secondary | ICD-10-CM

## 2015-11-24 DIAGNOSIS — Z85118 Personal history of other malignant neoplasm of bronchus and lung: Secondary | ICD-10-CM | POA: Diagnosis not present

## 2015-11-24 DIAGNOSIS — Z981 Arthrodesis status: Secondary | ICD-10-CM | POA: Diagnosis not present

## 2015-11-24 DIAGNOSIS — I2510 Atherosclerotic heart disease of native coronary artery without angina pectoris: Secondary | ICD-10-CM | POA: Diagnosis present

## 2015-11-24 DIAGNOSIS — E119 Type 2 diabetes mellitus without complications: Secondary | ICD-10-CM | POA: Diagnosis not present

## 2015-11-24 DIAGNOSIS — J45909 Unspecified asthma, uncomplicated: Secondary | ICD-10-CM | POA: Diagnosis present

## 2015-11-24 DIAGNOSIS — K219 Gastro-esophageal reflux disease without esophagitis: Secondary | ICD-10-CM | POA: Diagnosis present

## 2015-11-24 DIAGNOSIS — J9621 Acute and chronic respiratory failure with hypoxia: Secondary | ICD-10-CM | POA: Diagnosis not present

## 2015-11-24 DIAGNOSIS — F419 Anxiety disorder, unspecified: Secondary | ICD-10-CM | POA: Diagnosis present

## 2015-11-24 DIAGNOSIS — J9622 Acute and chronic respiratory failure with hypercapnia: Secondary | ICD-10-CM | POA: Diagnosis not present

## 2015-11-24 DIAGNOSIS — J449 Chronic obstructive pulmonary disease, unspecified: Secondary | ICD-10-CM

## 2015-11-24 DIAGNOSIS — Z87891 Personal history of nicotine dependence: Secondary | ICD-10-CM

## 2015-11-24 DIAGNOSIS — Z9981 Dependence on supplemental oxygen: Secondary | ICD-10-CM

## 2015-11-24 DIAGNOSIS — R0602 Shortness of breath: Secondary | ICD-10-CM | POA: Diagnosis not present

## 2015-11-24 DIAGNOSIS — J9602 Acute respiratory failure with hypercapnia: Secondary | ICD-10-CM | POA: Diagnosis present

## 2015-11-24 DIAGNOSIS — J189 Pneumonia, unspecified organism: Secondary | ICD-10-CM

## 2015-11-24 DIAGNOSIS — Z923 Personal history of irradiation: Secondary | ICD-10-CM | POA: Diagnosis not present

## 2015-11-24 DIAGNOSIS — E1151 Type 2 diabetes mellitus with diabetic peripheral angiopathy without gangrene: Secondary | ICD-10-CM | POA: Diagnosis present

## 2015-11-24 DIAGNOSIS — G4733 Obstructive sleep apnea (adult) (pediatric): Secondary | ICD-10-CM | POA: Diagnosis present

## 2015-11-24 DIAGNOSIS — E872 Acidosis: Secondary | ICD-10-CM | POA: Diagnosis not present

## 2015-11-24 DIAGNOSIS — I10 Essential (primary) hypertension: Secondary | ICD-10-CM | POA: Diagnosis not present

## 2015-11-24 DIAGNOSIS — E785 Hyperlipidemia, unspecified: Secondary | ICD-10-CM | POA: Diagnosis present

## 2015-11-24 DIAGNOSIS — Z7982 Long term (current) use of aspirin: Secondary | ICD-10-CM | POA: Diagnosis not present

## 2015-11-24 DIAGNOSIS — J441 Chronic obstructive pulmonary disease with (acute) exacerbation: Secondary | ICD-10-CM | POA: Diagnosis not present

## 2015-11-24 DIAGNOSIS — C342 Malignant neoplasm of middle lobe, bronchus or lung: Secondary | ICD-10-CM | POA: Diagnosis not present

## 2015-11-24 DIAGNOSIS — A419 Sepsis, unspecified organism: Secondary | ICD-10-CM | POA: Diagnosis present

## 2015-11-24 DIAGNOSIS — Z7902 Long term (current) use of antithrombotics/antiplatelets: Secondary | ICD-10-CM | POA: Diagnosis not present

## 2015-11-24 DIAGNOSIS — J4489 Other specified chronic obstructive pulmonary disease: Secondary | ICD-10-CM

## 2015-11-24 DIAGNOSIS — C3490 Malignant neoplasm of unspecified part of unspecified bronchus or lung: Secondary | ICD-10-CM

## 2015-11-24 DIAGNOSIS — R069 Unspecified abnormalities of breathing: Secondary | ICD-10-CM | POA: Diagnosis not present

## 2015-11-24 LAB — I-STAT ARTERIAL BLOOD GAS, ED
BICARBONATE: 29.6 meq/L — AB (ref 20.0–24.0)
O2 Saturation: 98 %
PCO2 ART: 69.1 mmHg — AB (ref 35.0–45.0)
Patient temperature: 98.2
TCO2: 32 mmol/L (ref 0–100)
pH, Arterial: 7.239 — ABNORMAL LOW (ref 7.350–7.450)
pO2, Arterial: 117 mmHg — ABNORMAL HIGH (ref 80.0–100.0)

## 2015-11-24 LAB — CBC WITH DIFFERENTIAL/PLATELET
BASOS ABS: 0.3 10*3/uL — AB (ref 0.0–0.1)
BASOS PCT: 1 %
EOS ABS: 0.8 10*3/uL — AB (ref 0.0–0.7)
Eosinophils Relative: 3 %
HEMATOCRIT: 51 % (ref 39.0–52.0)
HEMOGLOBIN: 16.1 g/dL (ref 13.0–17.0)
Lymphocytes Relative: 28 %
Lymphs Abs: 7.3 10*3/uL — ABNORMAL HIGH (ref 0.7–4.0)
MCH: 30.2 pg (ref 26.0–34.0)
MCHC: 31.6 g/dL (ref 30.0–36.0)
MCV: 95.7 fL (ref 78.0–100.0)
MONOS PCT: 9 %
Monocytes Absolute: 2.3 10*3/uL — ABNORMAL HIGH (ref 0.1–1.0)
NEUTROS ABS: 15.3 10*3/uL — AB (ref 1.7–7.7)
Neutrophils Relative %: 59 %
Platelets: 221 10*3/uL (ref 150–400)
RBC: 5.33 MIL/uL (ref 4.22–5.81)
RDW: 15.2 % (ref 11.5–15.5)
WBC: 26 10*3/uL — ABNORMAL HIGH (ref 4.0–10.5)

## 2015-11-24 LAB — GLUCOSE, CAPILLARY
Glucose-Capillary: 316 mg/dL — ABNORMAL HIGH (ref 65–99)
Glucose-Capillary: 174 mg/dL — ABNORMAL HIGH (ref 65–99)
Glucose-Capillary: 150 mg/dL — ABNORMAL HIGH (ref 65–99)

## 2015-11-24 LAB — COMPREHENSIVE METABOLIC PANEL
ALBUMIN: 3.8 g/dL (ref 3.5–5.0)
ALK PHOS: 158 U/L — AB (ref 38–126)
ALT: 36 U/L (ref 17–63)
AST: 55 U/L — AB (ref 15–41)
Anion gap: 15 (ref 5–15)
BILIRUBIN TOTAL: 0.2 mg/dL — AB (ref 0.3–1.2)
BUN: 10 mg/dL (ref 6–20)
CO2: 23 mmol/L (ref 22–32)
Calcium: 9.3 mg/dL (ref 8.9–10.3)
Chloride: 100 mmol/L — ABNORMAL LOW (ref 101–111)
Creatinine, Ser: 1.23 mg/dL (ref 0.61–1.24)
GFR calc Af Amer: >60 mL/min (ref >60)
GFR calc non Af Amer: >60 mL/min (ref >60)
GLUCOSE: 271 mg/dL — AB (ref 65–99)
POTASSIUM: 4.5 mmol/L (ref 3.5–5.1)
Sodium: 138 mmol/L (ref 135–145)
TOTAL PROTEIN: 6.2 g/dL — AB (ref 6.5–8.1)

## 2015-11-24 LAB — BLOOD GAS, ARTERIAL
Acid-Base Excess: 0.8 mmol/L (ref 0.0–2.0)
BICARBONATE: 30.4 meq/L — AB (ref 20.0–24.0)
DELIVERY SYSTEMS: POSITIVE
Drawn by: 441381
Expiratory PAP: 6
FIO2: 100
INSPIRATORY PAP: 18
LHR: 16 {breaths}/min
Mode: POSITIVE
O2 Saturation: 98.7 %
PATIENT TEMPERATURE: 98.2
PCO2 ART: 108 mmHg — AB (ref 35.0–45.0)
TCO2: 33.7 mmol/L (ref 0–100)
pH, Arterial: 7.075 — CL (ref 7.350–7.450)
pO2, Arterial: 240 mmHg — ABNORMAL HIGH (ref 80.0–100.0)

## 2015-11-24 LAB — I-STAT TROPONIN, ED: TROPONIN I, POC: 0.02 ng/mL (ref 0.00–0.08)

## 2015-11-24 LAB — BRAIN NATRIURETIC PEPTIDE: B NATRIURETIC PEPTIDE 5: 211.9 pg/mL — AB (ref 0.0–100.0)

## 2015-11-24 LAB — MRSA PCR SCREENING: MRSA by PCR: NEGATIVE

## 2015-11-24 LAB — LACTIC ACID, PLASMA
LACTIC ACID, VENOUS: 2.8 mmol/L — AB (ref 0.5–2.0)
LACTIC ACID, VENOUS: 2.2 mmol/L — AB (ref 0.5–2.0)

## 2015-11-24 LAB — INFLUENZA PANEL BY PCR (TYPE A & B)
H1N1 flu by pcr: NOT DETECTED
INFLAPCR: NEGATIVE
INFLBPCR: NEGATIVE

## 2015-11-24 LAB — EXPECTORATED SPUTUM ASSESSMENT W GRAM STAIN, RFLX TO RESP C

## 2015-11-24 LAB — STREP PNEUMONIAE URINARY ANTIGEN: Strep Pneumo Urinary Antigen: POSITIVE — AB

## 2015-11-24 MED ORDER — DEXTROSE 5 % IV SOLN
1.0000 g | INTRAVENOUS | Status: DC
  Administered 2015-11-24 – 2015-11-29 (×6): 1 g via INTRAVENOUS
  Filled 2015-11-24 (×10): qty 10

## 2015-11-24 MED ORDER — INSULIN ASPART 100 UNIT/ML ~~LOC~~ SOLN
0.0000 [IU] | SUBCUTANEOUS | Status: DC
  Administered 2015-11-24: 3 [IU] via SUBCUTANEOUS
  Administered 2015-11-25: 5 [IU] via SUBCUTANEOUS
  Administered 2015-11-25: 8 [IU] via SUBCUTANEOUS
  Administered 2015-11-25: 2 [IU] via SUBCUTANEOUS
  Administered 2015-11-25: 3 [IU] via SUBCUTANEOUS

## 2015-11-24 MED ORDER — MOMETASONE FURO-FORMOTEROL FUM 200-5 MCG/ACT IN AERO
2.0000 | INHALATION_SPRAY | Freq: Two times a day (BID) | RESPIRATORY_TRACT | Status: DC
  Administered 2015-11-24 – 2015-11-30 (×13): 2 via RESPIRATORY_TRACT
  Filled 2015-11-24: qty 8.8

## 2015-11-24 MED ORDER — ALBUTEROL SULFATE (2.5 MG/3ML) 0.083% IN NEBU: 2.5000 mg | INHALATION_SOLUTION | RESPIRATORY_TRACT | Status: DC | PRN

## 2015-11-24 MED ORDER — ARIPIPRAZOLE 10 MG PO TABS
5.0000 mg | ORAL_TABLET | Freq: Every day | ORAL | Status: DC
  Administered 2015-11-24 – 2015-11-29 (×6): 5 mg via ORAL
  Filled 2015-11-24 (×7): qty 1

## 2015-11-24 MED ORDER — MAGNESIUM SULFATE 2 GM/50ML IV SOLN
2.0000 g | Freq: Once | INTRAVENOUS | Status: AC
  Administered 2015-11-24: 2 g via INTRAVENOUS

## 2015-11-24 MED ORDER — HEPARIN SODIUM (PORCINE) 5000 UNIT/ML IJ SOLN
5000.0000 [IU] | Freq: Three times a day (TID) | INTRAMUSCULAR | Status: DC
  Administered 2015-11-24 – 2015-11-27 (×10): 5000 [IU] via SUBCUTANEOUS
  Filled 2015-11-24 (×9): qty 1

## 2015-11-24 MED ORDER — NITROGLYCERIN 0.4 MG SL SUBL: 0.4000 mg | SUBLINGUAL_TABLET | SUBLINGUAL | Status: DC | PRN

## 2015-11-24 MED ORDER — PROCHLORPERAZINE MALEATE 10 MG PO TABS
10.0000 mg | ORAL_TABLET | Freq: Four times a day (QID) | ORAL | Status: DC | PRN
  Filled 2015-11-24: qty 1

## 2015-11-24 MED ORDER — INSULIN ASPART 100 UNIT/ML ~~LOC~~ SOLN
0.0000 [IU] | SUBCUTANEOUS | Status: DC
  Administered 2015-11-24: 1 [IU] via SUBCUTANEOUS
  Administered 2015-11-24: 2 [IU] via SUBCUTANEOUS
  Administered 2015-11-24: 7 [IU] via SUBCUTANEOUS

## 2015-11-24 MED ORDER — GABAPENTIN 100 MG PO CAPS
100.0000 mg | ORAL_CAPSULE | Freq: Every day | ORAL | Status: DC
  Administered 2015-11-24 – 2015-11-30 (×7): 100 mg via ORAL
  Filled 2015-11-24 (×8): qty 1

## 2015-11-24 MED ORDER — METOPROLOL SUCCINATE ER 25 MG PO TB24
12.5000 mg | ORAL_TABLET | Freq: Every day | ORAL | Status: DC
  Administered 2015-11-24 – 2015-11-30 (×7): 12.5 mg via ORAL
  Filled 2015-11-24 (×8): qty 1

## 2015-11-24 MED ORDER — MAGNESIUM SULFATE 2 GM/50ML IV SOLN
INTRAVENOUS | Status: AC
  Filled 2015-11-24: qty 50

## 2015-11-24 MED ORDER — ATORVASTATIN CALCIUM 20 MG PO TABS
20.0000 mg | ORAL_TABLET | Freq: Every day | ORAL | Status: DC
  Administered 2015-11-24 – 2015-11-29 (×6): 20 mg via ORAL
  Filled 2015-11-24 (×7): qty 1

## 2015-11-24 MED ORDER — TIOTROPIUM BROMIDE-OLODATEROL 2.5-2.5 MCG/ACT IN AERS: 2.0000 | INHALATION_SPRAY | Freq: Every day | RESPIRATORY_TRACT | Status: DC

## 2015-11-24 MED ORDER — ARFORMOTEROL TARTRATE 15 MCG/2ML IN NEBU
15.0000 ug | INHALATION_SOLUTION | Freq: Two times a day (BID) | RESPIRATORY_TRACT | Status: DC
  Administered 2015-11-24 – 2015-11-30 (×13): 15 ug via RESPIRATORY_TRACT
  Filled 2015-11-24 (×14): qty 2

## 2015-11-24 MED ORDER — TAMSULOSIN HCL 0.4 MG PO CAPS
0.4000 mg | ORAL_CAPSULE | Freq: Every day | ORAL | Status: DC
  Administered 2015-11-24 – 2015-11-30 (×7): 0.4 mg via ORAL
  Filled 2015-11-24 (×7): qty 1

## 2015-11-24 MED ORDER — CLONAZEPAM 1 MG PO TABS
2.0000 mg | ORAL_TABLET | Freq: Every evening | ORAL | Status: DC | PRN
  Administered 2015-11-26 – 2015-11-29 (×3): 2 mg via ORAL
  Filled 2015-11-24 (×3): qty 2

## 2015-11-24 MED ORDER — ARMODAFINIL 150 MG PO TABS: 150.0000 mg | ORAL_TABLET | Freq: Every morning | ORAL | Status: DC

## 2015-11-24 MED ORDER — ALPRAZOLAM 0.25 MG PO TABS
0.2500 mg | ORAL_TABLET | Freq: Every day | ORAL | Status: DC | PRN
  Administered 2015-11-25: 0.25 mg via ORAL
  Filled 2015-11-24 (×2): qty 1

## 2015-11-24 MED ORDER — METHOCARBAMOL 500 MG PO TABS
500.0000 mg | ORAL_TABLET | Freq: Two times a day (BID) | ORAL | Status: DC | PRN
  Administered 2015-11-28 – 2015-11-29 (×2): 500 mg via ORAL
  Filled 2015-11-24 (×2): qty 1

## 2015-11-24 MED ORDER — ASPIRIN EC 81 MG PO TBEC
81.0000 mg | DELAYED_RELEASE_TABLET | Freq: Every day | ORAL | Status: DC
  Administered 2015-11-24 – 2015-11-30 (×7): 81 mg via ORAL
  Filled 2015-11-24 (×7): qty 1

## 2015-11-24 MED ORDER — METHYLPREDNISOLONE SODIUM SUCC 125 MG IJ SOLR
60.0000 mg | Freq: Two times a day (BID) | INTRAMUSCULAR | Status: DC
  Administered 2015-11-24 – 2015-11-27 (×6): 60 mg via INTRAVENOUS
  Filled 2015-11-24 (×7): qty 2

## 2015-11-24 MED ORDER — MORPHINE SULFATE ER 30 MG PO TBCR
30.0000 mg | EXTENDED_RELEASE_TABLET | Freq: Two times a day (BID) | ORAL | Status: DC
  Administered 2015-11-24 – 2015-11-30 (×13): 30 mg via ORAL
  Filled 2015-11-24: qty 2
  Filled 2015-11-24 (×3): qty 1
  Filled 2015-11-24: qty 2
  Filled 2015-11-24: qty 1
  Filled 2015-11-24 (×4): qty 2
  Filled 2015-11-24: qty 1
  Filled 2015-11-24: qty 2
  Filled 2015-11-24: qty 1

## 2015-11-24 MED ORDER — MIRTAZAPINE 45 MG PO TABS
45.0000 mg | ORAL_TABLET | Freq: Every day | ORAL | Status: DC
  Administered 2015-11-24 – 2015-11-29 (×6): 45 mg via ORAL
  Filled 2015-11-24: qty 3
  Filled 2015-11-24: qty 1
  Filled 2015-11-24: qty 3
  Filled 2015-11-24 (×2): qty 1
  Filled 2015-11-24 (×2): qty 3
  Filled 2015-11-24 (×3): qty 1

## 2015-11-24 MED ORDER — DM-GUAIFENESIN ER 30-600 MG PO TB12
1.0000 | ORAL_TABLET | Freq: Two times a day (BID) | ORAL | Status: DC
  Administered 2015-11-24 – 2015-11-30 (×13): 1 via ORAL
  Filled 2015-11-24 (×15): qty 1

## 2015-11-24 MED ORDER — CLOPIDOGREL BISULFATE 75 MG PO TABS
75.0000 mg | ORAL_TABLET | Freq: Every day | ORAL | Status: DC
  Administered 2015-11-24 – 2015-11-30 (×7): 75 mg via ORAL
  Filled 2015-11-24 (×7): qty 1

## 2015-11-24 MED ORDER — CETYLPYRIDINIUM CHLORIDE 0.05 % MT LIQD
7.0000 mL | Freq: Two times a day (BID) | OROMUCOSAL | Status: DC
  Administered 2015-11-24 – 2015-11-30 (×11): 7 mL via OROMUCOSAL

## 2015-11-24 MED ORDER — ALBUTEROL SULFATE (2.5 MG/3ML) 0.083% IN NEBU
2.5000 mg | INHALATION_SOLUTION | Freq: Four times a day (QID) | RESPIRATORY_TRACT | Status: DC
  Administered 2015-11-24: 2.5 mg via RESPIRATORY_TRACT
  Filled 2015-11-24: qty 3

## 2015-11-24 MED ORDER — ISOSORBIDE MONONITRATE ER 60 MG PO TB24
60.0000 mg | ORAL_TABLET | Freq: Every day | ORAL | Status: DC
  Administered 2015-11-24 – 2015-11-30 (×7): 60 mg via ORAL
  Filled 2015-11-24 (×3): qty 1
  Filled 2015-11-24 (×3): qty 2
  Filled 2015-11-24: qty 1
  Filled 2015-11-24: qty 2

## 2015-11-24 MED ORDER — ALBUTEROL SULFATE HFA 108 (90 BASE) MCG/ACT IN AERS: 2.0000 | INHALATION_SPRAY | Freq: Four times a day (QID) | RESPIRATORY_TRACT | Status: DC

## 2015-11-24 MED ORDER — MORPHINE SULFATE 15 MG PO TABS
15.0000 mg | ORAL_TABLET | ORAL | Status: DC | PRN
  Administered 2015-11-27 – 2015-11-29 (×2): 15 mg via ORAL
  Filled 2015-11-24 (×2): qty 1

## 2015-11-24 MED ORDER — FLUOXETINE HCL 20 MG PO CAPS
40.0000 mg | ORAL_CAPSULE | Freq: Every morning | ORAL | Status: DC
  Administered 2015-11-24 – 2015-11-30 (×7): 40 mg via ORAL
  Filled 2015-11-24 (×2): qty 2
  Filled 2015-11-24: qty 4
  Filled 2015-11-24 (×7): qty 2

## 2015-11-24 MED ORDER — FINASTERIDE 5 MG PO TABS
5.0000 mg | ORAL_TABLET | Freq: Every day | ORAL | Status: DC
  Administered 2015-11-24 – 2015-11-30 (×7): 5 mg via ORAL
  Filled 2015-11-24 (×9): qty 1

## 2015-11-24 MED ORDER — MODAFINIL 100 MG PO TABS
100.0000 mg | ORAL_TABLET | Freq: Every day | ORAL | Status: DC
  Administered 2015-11-24 – 2015-11-30 (×7): 100 mg via ORAL
  Filled 2015-11-24 (×7): qty 1

## 2015-11-24 MED ORDER — SODIUM CHLORIDE 0.9 % IV SOLN
INTRAVENOUS | Status: DC
  Administered 2015-11-24 – 2015-11-27 (×7): via INTRAVENOUS

## 2015-11-24 MED ORDER — THEOPHYLLINE ER 400 MG PO TB24
400.0000 mg | ORAL_TABLET | Freq: Every day | ORAL | Status: DC
  Administered 2015-11-24 – 2015-11-30 (×7): 400 mg via ORAL
  Filled 2015-11-24 (×10): qty 1

## 2015-11-24 MED ORDER — POTASSIUM CHLORIDE CRYS ER 20 MEQ PO TBCR
20.0000 meq | EXTENDED_RELEASE_TABLET | Freq: Every morning | ORAL | Status: DC
  Administered 2015-11-24 – 2015-11-27 (×4): 20 meq via ORAL
  Filled 2015-11-24 (×4): qty 1

## 2015-11-24 MED ORDER — DEXTROSE 5 % IV SOLN
500.0000 mg | INTRAVENOUS | Status: DC
  Administered 2015-11-24 – 2015-11-27 (×4): 500 mg via INTRAVENOUS
  Filled 2015-11-24 (×5): qty 500

## 2015-11-24 MED ORDER — IPRATROPIUM-ALBUTEROL 0.5-2.5 (3) MG/3ML IN SOLN
3.0000 mL | Freq: Four times a day (QID) | RESPIRATORY_TRACT | Status: DC
  Administered 2015-11-24 – 2015-11-27 (×12): 3 mL via RESPIRATORY_TRACT
  Filled 2015-11-24 (×10): qty 3

## 2015-11-24 MED ORDER — SODIUM CHLORIDE 0.9 % IJ SOLN
3.0000 mL | Freq: Two times a day (BID) | INTRAMUSCULAR | Status: DC
  Administered 2015-11-24 – 2015-11-30 (×10): 3 mL via INTRAVENOUS

## 2015-11-24 MED ORDER — PANTOPRAZOLE SODIUM 40 MG PO TBEC
80.0000 mg | DELAYED_RELEASE_TABLET | Freq: Every day | ORAL | Status: DC
  Administered 2015-11-24 – 2015-11-30 (×7): 80 mg via ORAL
  Filled 2015-11-24 (×7): qty 2

## 2015-11-24 NOTE — Progress Notes (Signed)
Utilization Review Completed.  

## 2015-11-24 NOTE — Progress Notes (Signed)
CRITICAL VALUE ALERT  Critical value received: Lactic acid  Date of notification:  11-24-15  Time of notification:  7530  Critical value read back:yes  Nurse who received alert:  Tyronn Golda  MD notified (1st page):  [place]  Time of first page:  48  MD notified (2nd page):  Time of second page:  Responding MD:    Time MD responded:

## 2015-11-24 NOTE — Progress Notes (Signed)
[facility] TEAM 1 - Stepdown/ICU TEAM Progress Note  Ernest [name] RSW:[id number] DOB: [birth year] DOA: 11/24/2015 PCP: [name], MD  Admit HPI / Brief Narrative: 59 y.o. 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 Desaturated en route to ED per EMS. 2 neb treatments and 125 mg solumedrol given en route. He was essentially unresponsive on arrival. He was placed on BIPAP, and thankfully responded, his initial ABG of 7.075/108/240/30 fio2 100%, has improved to 7.239/69/117/29. The patient is now awake, says he is breathing "much better", and answering questions while on BIPAP.  HPI/Subjective: 12 /30  A/O 4, states on home O2 4 L 24 hours per day. States last XRT + chemotherapy 2015. Oncologist is Dr. Curt Bears, Follows up q 3 months   Assessment/Plan: Acute on chronic respiratory failure with hypercapnia and hypoxia/COPD exacerbation -Theophylline level pending  Squamous Cell Lung Ca -Contact Dr. Curt Bears in the Am. to inform him of admission of his patient  Sepsis -Patient meets criteria for sepsis HR> 90, RR> 20,WBC> 12; possible source of infection COPD exacerbation vs CAP -Strep pneumo urine antigen, Legionella urine antigen pending -Respiratory virus panel pending -Influenza panel pending -Sputum pending -Start normal saline 162m/hr -DuoNeb QID -Flutter valve -Physiotherapy vest BID -Mucinex DM BID -PCXR in A.m.  DM Type 2  - Moderate SSI while on steroids -Hemoglobin A1c pending -Lipid panel pending             Code Status: FULL Family Communication: Wife present at time of exam Disposition Plan: Resolution sepsis    Consultants:   Procedure/Significant Events: 5/19 echocardiogram;- LVEF= 55%-60%. - MV: mild regurgitation. - Left atrium: mildly dilated.--  Right ventricle: mildly dilated. - Right atrium: mildly dilated.   Culture 12/30 urine pending 12/30 blood 2 pending 12/30 Strep pneumo urine antigen, Legionella urine antigen pending 12/30 Respiratory virus panel pending 12/30 Influenza panel pending 12/30Sputum pending    Antibiotics: Azithromycin 12/30>> Ceftriaxone 12/30>>   DVT prophylaxis: Heparin subcutaneous   Devices    LINES / TUBES:      Continuous Infusions: . sodium chloride 125 mL/hr at 11/24/15 1451    Objective: VITAL SIGNS: Temp: 98.6 F (37 C) (12/30 1650) Temp Source: Oral (12/30 1650) BP: 107/68 mmHg (12/30 1650) Pulse Rate: 85 (12/30 1428) SPO2; FIO2:   Intake/Output Summary (Last 24 hours) at 11/24/15 1858 Last data filed at 11/24/15 1400  Gross per 24 hour  Intake 648.83 ml  Output    575 ml  Net  73.83 ml     Exam: General: A/O 4, positive acute respiratory distress (now off BiPAP) Eyes: Negative headache, negative scleral hemorrhage ENT: Negative Runny nose, negative ear pain, negative gingival bleeding, Neck:  Negative scars, masses, torticollis, lymphadenopathy, JVD Lungs: diffuse rhonchi with positive expiratory wheeze, positive purulent yellow/green sputum, negative crackles Cardiovascular: Tachycardic, Regular rhythm without murmur gallop or rub normal S1 and S2 Abdomen:negative abdominal pain, nondistended, positive soft, bowel sounds, no rebound, no ascites, no appreciable mass Extremities: No significant cyanosis, clubbing, or edema bilateral lower extremities Psychiatric:  Negative depression, negative anxiety, negative fatigue, negative mania  Neurologic:  Cranial nerves II through XII intact, tongue/uvula midline, all extremities muscle strength 5/5, sensation intact throughout, negative dysarthria, negative expressive aphasia, negative receptive aphasia.   Data Reviewed: Basic Metabolic Panel:  Recent Labs Lab  11/24/15 0154  NA 138  K 4.5  CL 100*  CO2  23  GLUCOSE 271*  BUN 10  CREATININE 1.23  CALCIUM 9.3   Liver Function Tests:  Recent Labs Lab 11/24/15 0154  AST 55*  ALT 36  ALKPHOS 158*  BILITOT 0.2*  PROT 6.2*  ALBUMIN 3.8   No results for input(s): LIPASE, AMYLASE in the last 168 hours. No results for input(s): AMMONIA in the last 168 hours. CBC:  Recent Labs Lab 11/24/15 0154  WBC 26.0*  NEUTROABS 15.3*  HGB 16.1  HCT 51.0  MCV 95.7  PLT 221   Cardiac Enzymes: No results for input(s): CKTOTAL, CKMB, CKMBINDEX, TROPONINI in the last 168 hours. BNP (last 3 results)  Recent Labs  04/11/15 0235 11/24/15 0154  BNP 305.2* 211.9*    ProBNP (last 3 results) No results for input(s): PROBNP in the last 8760 hours.  CBG:  Recent Labs Lab 11/24/15 0822 11/24/15 1252 11/24/15 1647  GLUCAP 316* 174* 150*    Recent Results (from the past 240 hour(s))  MRSA PCR Screening     Status: None   Collection Time: 11/24/15  7:03 AM  Result Value Ref Range Status   MRSA by PCR NEGATIVE NEGATIVE Final    Comment:        The GeneXpert MRSA Assay (FDA approved for NASAL specimens only), is one component of a comprehensive MRSA colonization surveillance program. It is not intended to diagnose MRSA infection nor to guide or monitor treatment for MRSA infections.   Culture, blood (routine x 2)     Status: None (Preliminary result)   Collection Time: 11/24/15 12:50 PM  Result Value Ref Range Status   Specimen Description BLOOD BLOOD RIGHT FOREARM  Final   Special Requests BOTTLES DRAWN AEROBIC AND ANAEROBIC 5CC  Final   Culture PENDING  Incomplete   Report Status PENDING  Incomplete     Studies:  Recent x-ray studies have been reviewed in detail by the Attending Physician  Scheduled Meds:  Scheduled Meds: . arformoterol  15 mcg Nebulization BID  . ARIPiprazole  5 mg Oral QHS  . aspirin EC  81 mg Oral Daily  . atorvastatin  20 mg Oral q1800  . azithromycin  500 mg Intravenous Q24H  . cefTRIAXone  (ROCEPHIN)  IV  1 g Intravenous Q24H  . clopidogrel  75 mg Oral Daily  . dextromethorphan-guaiFENesin  1 tablet Oral BID  . finasteride  5 mg Oral Daily  . FLUoxetine  40 mg Oral q morning - 10a  . gabapentin  100 mg Oral Daily  . heparin  5,000 Units Subcutaneous 3 times per day  . insulin aspart  0-9 Units Subcutaneous 6 times per day  . ipratropium-albuterol  3 mL Nebulization Q6H  . isosorbide mononitrate  60 mg Oral Daily  . methylPREDNISolone (SOLU-MEDROL) injection  60 mg Intravenous Q12H  . metoprolol succinate  12.5 mg Oral Daily  . mirtazapine  45 mg Oral QHS  . modafinil  100 mg Oral Daily  . mometasone-formoterol  2 puff Inhalation BID  . morphine  30 mg Oral Q12H  . pantoprazole  80 mg Oral Q1200  . potassium chloride SA  20 mEq Oral q morning - 10a  . sodium chloride  3 mL Intravenous Q12H  . tamsulosin  0.4 mg Oral Daily  . theophylline  400 mg Oral Daily    Time spent on care of this patient: 40 mins   Mamie Hundertmark, Geraldo Docker , MD  [facility] Office  [phone] Pager - [phone]  On-Call/Text Page:      [email or web address]      password [email or web address]  If 7PM-7AM, please contact night-coverage [email or web address] Password [email or web address] 11/24/2015, 6:58 PM   LOS: 0 days   Care during the described time interval was provided by me .  I have reviewed this patient's available data, including medical history, events of note, physical examination, and all test results as part of my evaluation. I have personally reviewed and interpreted all radiology studies.   Dia Crawford, MD [phone] Pager

## 2015-11-24 NOTE — ED Provider Notes (Addendum)
CSN: [id number]     Arrival date & time 11/24/15  0138 History  By signing my name below, I, Altamease Oiler, attest that this documentation has been prepared under the direction and in the presence of Veryl Speak, MD. Electronically Signed: Altamease Oiler, ED Scribe. 11/24/2015. 3:40 AM     Chief Complaint  Patient presents with  . Shortness of Breath   Level V caveat due to respiratory distress   The history is provided by the EMS personnel. No language interpreter was used.   Brought in by EMS of CPAP, Ernest [name] is a 59 y.o. male with history of COPD and lung cancer who presents to the Emergency Department complaining of respiratory distress. Per EMS, the pt called for SOB. He was 95% on RA but desaturated en route. 2 nebulizer treatments and 125 solu-medrol were given PTA.   Past Medical History  Diagnosis Date  . CAD (coronary artery disease)     Left Main 30% stenosis, LAD 20 - 30 % stenosis, first and second diagonal branchesat 40 - 50%  stenosis with small arteries, circumflex had 30% stenosis in the large obtuse marginal, RCA at 70 - 80%  stenosis [not felt to be occlusive after evaluation with flow wire], distal 50 - 60% stenosis - [name][  . COPD (chronic obstructive pulmonary disease) (HCC)     Dr. Baird Lyons  . Depression   . Anxiety   . Hyperlipidemia   . Chronic insomnia   . Gout   . GERD (gastroesophageal reflux disease)   . PVD (peripheral vascular disease) (HCC)     PTA/Stent right common iliac  . DDD (degenerative disc disease)   . Hx of colonoscopy   . COPD with asthma ([place]) 09/08/2007  . OSA (obstructive sleep apnea)     NPSG 09/10/10- AHI 11.3/hr  . Hypertension     dr Percival Spanish  . History of radiation therapy 11/10/13- 12/29/13    left lung 6600 cGy in 33 sessions  . Diabetes mellitus without complication ([place]) 9/41/7408  . Cancer ([place])     lung  . Lung cancer ([place]) 10/04/13    LUL squamous cell lung cancer  . History of  cardiovascular stress test     Myoview 7/16:  Diaphragmatic attenuation, no ischemia, EF 56%; Low Risk   Past Surgical History  Procedure Laterality Date  . Spinal fusion  03/05/2007    L4-L5  . Hip surgery      left 'bone graft taken"  . Arm surgery      left elbow  . Shoulder surgery      right  . C-spine surgery    . Angioplasty    . Video bronchoscopy with endobronchial navigation N/A 10/04/2013    Procedure: VIDEO BRONCHOSCOPY WITH ENDOBRONCHIAL NAVIGATION;  Surgeon: Collene Gobble, MD;  Location: [facility];  Service: Thoracic;  Laterality: N/A;  . Back surgery      lower  . Anterior fusion cervical spine      cervical fusion  7 yrs ago ([facility])  . Colon surgery      '11 "Diverticulitis"  . Colonoscopy with propofol N/A 05/22/2015    Procedure: COLONOSCOPY WITH PROPOFOL;  Surgeon: Inda Castle, MD;  Location: [facility];  Service: Endoscopy;  Laterality: N/A;  . Peripheral vascular catheterization N/A 08/10/2015    Procedure: Lower Extremity Angiography;  Surgeon: Lorretta Harp, MD; Distal Ao OK, L-EIA stent OK, R-CIA 100% s/p overlapping 7 mm x 38 mm ICast stents, 50-60%  R-CFA       Family History  Problem Relation Age of Onset  . Heart attack Mother   . Heart attack Sister   . Lung cancer Sister   . Cancer Sister     small cell lung, mets to brain  . Emphysema Sister   . Hypertension Brother   . Stroke Neg Hx    Social History  Substance Use Topics  . Smoking status: Former Smoker -- 0.50 packs/day for 43 years    Types: Cigarettes    Quit date: 09/23/2013  . Smokeless tobacco: Never Used     Comment: history of 3 PPD, 11/02/13   . Alcohol Use: No    Review of Systems  Unable to perform ROS: Severe respiratory distress   Allergies  Carboplatin  Home Medications   Prior to Admission medications   Medication Sig Start Date End Date Taking? Authorizing Provider  albuterol (PROVENTIL) (2.5 MG/3ML) 0.083% nebulizer solution INHALE 1 VIAL IN NEBULIZER EVERY 4  HOURS AS NEEDED FOR WHEEZING OR SHORTNESS OF BREATH 01/19/15  Yes Deneise Lever, MD  ALPRAZolam Duanne Moron) 0.25 MG tablet Take 0.25 mg by mouth daily as needed for anxiety.  03/10/14  Yes Doe-Hyun R Shawna Orleans, DO  arformoterol (BROVANA) 15 MCG/2ML NEBU Take 2 mLs (15 mcg total) by nebulization 2 (two) times daily. 09/22/15  Yes Deneise Lever, MD  ARIPiprazole (ABILIFY) 5 MG tablet Take 1 tablet (5 mg total) by mouth at bedtime. 03/30/13  Yes Doe-Hyun R Shawna Orleans, DO  Armodafinil (NUVIGIL) 150 MG tablet Take 150 mg by mouth every morning.    Yes Historical Provider, MD  aspirin 81 MG EC tablet TAKE 1 TABLET EVERY DAY 05/19/15  Yes Marletta Lor, MD  atorvastatin (LIPITOR) 20 MG tablet TAKE 1 TABLET EVERY DAY 08/01/15  Yes Dorena Cookey, MD  clonazePAM (KLONOPIN) 1 MG tablet Take 2 mg by mouth at bedtime as needed for anxiety.    Yes Historical Provider, MD  clopidogrel (PLAVIX) 75 MG tablet Take 1 tablet (75 mg total) by mouth daily. 08/11/15  Yes Almyra Deforest, PA  esomeprazole (NEXIUM) 40 MG capsule Take 40 mg by mouth every morning.  06/20/14  Yes Doe-Hyun R Shawna Orleans, DO  finasteride (PROSCAR) 5 MG tablet Take 5 mg by mouth daily.  10/28/15  Yes Historical Provider, MD  FLUoxetine (PROZAC) 40 MG capsule Take 40 mg by mouth every morning.   Yes Historical Provider, MD  Fluticasone-Salmeterol (ADVAIR DISKUS) 500-50 MCG/DOSE AEPB 1 puff then rinse mouth, twice daily maintenance 01/19/15  Yes Deneise Lever, MD  gabapentin (NEURONTIN) 100 MG capsule Take 100 mg by mouth daily.  07/15/15  Yes Historical Provider, MD  isosorbide mononitrate (IMDUR) 60 MG 24 hr tablet Take 1 tablet (60 mg total) by mouth daily. 11/11/14  Yes Minus Breeding, MD  methocarbamol (ROBAXIN) 500 MG tablet Take 500 mg by mouth 2 (two) times daily as needed for muscle spasms.  10/27/13  Yes Historical Provider, MD  metoprolol succinate (TOPROL-XL) 25 MG 24 hr tablet TAKE 1/2 TABLET BY MOUTH DAILY 07/27/15  Yes Minus Breeding, MD  mirtazapine (REMERON) 45 MG  tablet Take 1 tablet (45 mg total) by mouth at bedtime. 12/29/14  Yes Tammy S Parrett, NP  morphine (MS CONTIN) 30 MG 12 hr tablet Take 1 tablet (30 mg total) by mouth every 12 (twelve) hours. 10/31/15  Yes Janith Lima, MD  morphine (MSIR) 15 MG tablet Take 1 tablet (15 mg total) by mouth every 4 (  four) hours as needed for severe pain. 10/31/15  Yes Janith Lima, MD  nitroGLYCERIN (NITROSTAT) 0.4 MG SL tablet Place 1 tablet (0.4 mg total) under the tongue every 5 (five) minutes as needed for chest pain. 08/09/14  Yes Minus Breeding, MD  potassium chloride SA (K-DUR,KLOR-CON) 20 MEQ tablet Take 20 mEq by mouth every morning.    Yes Historical Provider, MD  predniSONE (DELTASONE) 5 MG tablet Take 5 mg by mouth daily. 10/19/15  Yes Historical Provider, MD  prochlorperazine (COMPAZINE) 10 MG tablet TAKE 1 TABLET BY MOUTH EVERY 6 HOURS AS NEEDED FOR NAUSEA AND VOMITING 09/22/15  Yes Curt Bears, MD  STIOLTO RESPIMAT 2.5-2.5 MCG/ACT AERS INHALE 2 PUFFS INTO THE LUNGS DAILY. 06/13/15  Yes Historical Provider, MD  tamsulosin (FLOMAX) 0.4 MG CAPS capsule Take 0.4 mg by mouth daily. 10/28/15  Yes Historical Provider, MD  theophylline (UNIPHYL) 400 MG 24 hr tablet Take 400 mg by mouth daily. 11/13/15  Yes Historical Provider, MD  VENTOLIN HFA 108 (90 BASE) MCG/ACT inhaler INHALE 2 PUFFS INTO THE LUNGS 4 TIMES DAILY AS NEEDED FOR WHEEZING 04/07/15  Yes Deneise Lever, MD  VOLTAREN 1 % GEL Apply 2 g topically daily as needed (for pain).  06/24/12  Yes Historical Provider, MD  benzonatate (TESSALON) 200 MG capsule TAKE 1 CAPSULE (200 MG TOTAL) BY MOUTH 3 (THREE) TIMES DAILY AS NEEDED FOR COUGH. Patient not taking: Reported on 11/24/2015 11/03/15   Deneise Lever, MD   BP 181/119 mmHg  Pulse 118  Temp(Src) 98.2 F (36.8 C) (Temporal)  Resp 29  SpO2 99% Physical Exam  Constitutional: He is oriented to person, place, and time.  Patient is an acutely on chronically ill-appearing 59 year old male  HENT:   Head: Normocephalic and atraumatic.  Neck: Normal range of motion. Neck supple.  Cardiovascular: Normal rate, regular rhythm and normal heart sounds.   No murmur heard. Pulmonary/Chest: Effort normal.  Patient initially arrived on CPAP. He is in respiratory distress. And breath sounds are coarse.  Abdominal: Soft. Bowel sounds are normal. He exhibits no distension. There is no tenderness.  Musculoskeletal: Normal range of motion. He exhibits no edema.  Neurological: He is alert and oriented to person, place, and time.  Skin: Skin is warm and dry.  Nursing note and vitals reviewed.   ED Course  Procedures (including critical care time)  COORDINATION OF CARE: 2:01 AM Treatment plan includes CXR, EKG, and lab work.  2:04 AM Updated the patient's wife at bedside.   3:38 AM-Consult complete with  Dr. Alcario Drought. Patient case explained and discussed. He will see the pt in the department.   Labs Review Labs Reviewed  COMPREHENSIVE METABOLIC PANEL - Abnormal; Notable for the following:    Chloride 100 (*)    Glucose, Bld 271 (*)    Total Protein 6.2 (*)    AST 55 (*)    Alkaline Phosphatase 158 (*)    Total Bilirubin 0.2 (*)    All other components within normal limits  CBC WITH DIFFERENTIAL/PLATELET - Abnormal; Notable for the following:    WBC 26.0 (*)    Neutro Abs 15.3 (*)    Lymphs Abs 7.3 (*)    Monocytes Absolute 2.3 (*)    Eosinophils Absolute 0.8 (*)    Basophils Absolute 0.3 (*)    All other components within normal limits  BRAIN NATRIURETIC PEPTIDE - Abnormal; Notable for the following:    B Natriuretic Peptide 211.9 (*)    All other components  within normal limits  BLOOD GAS, ARTERIAL - Abnormal; Notable for the following:    pH, Arterial 7.075 (*)    pCO2 arterial 108 (*)    pO2, Arterial 240 (*)    Bicarbonate 30.4 (*)    All other components within normal limits  I-STAT ARTERIAL BLOOD GAS, ED - Abnormal; Notable for the following:    pH, Arterial 7.239 (*)     pCO2 arterial 69.1 (*)    pO2, Arterial 117.0 (*)    Bicarbonate 29.6 (*)    All other components within normal limits  BLOOD GAS, ARTERIAL  I-STAT TROPOININ, ED    Imaging Review Dg Chest Port 1 View  11/24/2015  CLINICAL DATA:  59 year old male with shortness of breath EXAM: PORTABLE CHEST 1 VIEW COMPARISON:  Radiograph dated 10/05/2015 FINDINGS: Single-view of the chest demonstrate stable area of opacity at the left attacks, likely atelectasis/ scarring. Bibasilar atelectatic changes noted. There is no other focal consolidation, pleural effusion, or pneumothorax. Stable cardiac silhouette. Cervical fixation plate and screws noted. IMPRESSION: Left apical opacity, compatible with known scarring. No acute cardiopulmonary process. Electronically Signed   By: Anner Crete M.D.   On: 11/24/2015 02:02   I have personally reviewed and evaluated these images and lab results as part of my medical decision-making.   EKG Interpretation   Date/Time:  Friday November 24 2015 01:43:48 EST Ventricular Rate:  116 PR Interval:  175 QRS Duration: 94 QT Interval:  319 QTC Calculation: 443 R Axis:   94 Text Interpretation:  Sinus tachycardia Anterior infarct, old Baseline  wander in lead(s) III Confirmed by Anandi Abramo  MD, Roxine Whittinghill ([id number]) on 11/24/2015  2:42:31 AM      MDM   Final diagnoses:  SOB (shortness of breath)    Patient is a 59 year old male with history of end-stage COPD and lung cancer. His brought by EMS for evaluation of difficulty breathing and altered mental status. He was placed on CPAP by EMS. This was changed to BiPAP shortly after arriving to the ER. His initial blood gas revealed a respiratory acidosis with PCO2 over 100. After one hour on BiPAP, a blood gas was rechecked and his respiratory acidosis is improving. The remainder of the workup is unremarkable. He will be admitted to the hospitalist service under the care of Dr. Alcario Drought.  CRITICAL CARE Performed by: Veryl Speak Total critical care time: 60 minutes Critical care time was exclusive of separately billable procedures and treating other patients. Critical care was necessary to treat or prevent imminent or life-threatening deterioration. Critical care was time spent personally by me on the following activities: development of treatment plan with patient and/or surrogate as well as nursing, discussions with consultants, evaluation of patient's response to treatment, examination of patient, obtaining history from patient or surrogate, ordering and performing treatments and interventions, ordering and review of laboratory studies, ordering and review of radiographic studies, pulse oximetry and re-evaluation of patient's condition.   I personally performed the services described in this documentation, which was scribed in my presence. The recorded information has been reviewed and is accurate.       Veryl Speak, MD 11/24/15 3818  Veryl Speak, MD 12/13/15 [phone]

## 2015-11-24 NOTE — ED Notes (Signed)
Pt arrives via EMS from home. Called EMS for SOB. EMS reports that sats were 95% initially and continually decreased despite CPAP, 2 duonebs and 125 solu-medrol.

## 2015-11-24 NOTE — Progress Notes (Signed)
CRITICAL VALUE ALERT  Critical value received:  Lactic Acid 2.2  Date of notification:  11/24/2015  Time of notification:  2395  Critical value read back:Yes.    Nurse who received alert:  Pieter Partridge, Rn  MD notified (1st page):  Dr. Sherral Hammers  Time of first page:  1120  MD notified (2nd page):  Time of second page:  Responding MD:  Dr. Sherral Hammers  Time MD responded:  1120

## 2015-11-24 NOTE — H&P (Signed)
[facility] History and Physical  Ernest [name] LDJ:[id number] DOB: 07-18-1956 DOA: 11/24/2015  Referring physician: EDP PCP: [name], MD   Chief Complaint: Respiratory distress   HPI: Ernest [name] is a 59 y.o. male with history of severe COPD, chronic CO2 retention, lung cancer treated with radiation and chemo and is currently "stable" in size.  Patient presents to the ED in respiratory distress.  Desaturated en route to ED per EMS.  2 neb treatments and 125 mg solumedrol given en route.  He was essentially unresponsive on arrival.  He was placed on BIPAP, and thankfully responded, his initial ABG of 7.075/108/240/30 fio2 100%, has improved to 7.239/69/117/29.  The patient is now awake, says he is breathing "much better", and answering questions while on BIPAP.  Review of Systems: Systems reviewed.  As above, otherwise negative  Past Medical History  Diagnosis Date  . CAD (coronary artery disease)     Left Main 30% stenosis, LAD 20 - 30 % stenosis, first and second diagonal branchesat 40 - 50%  stenosis with small arteries, circumflex had 30% stenosis in the large obtuse marginal, RCA at 70 - 80%  stenosis [not felt to be occlusive after evaluation with flow wire], distal 50 - 60% stenosis - [name][  . COPD (chronic obstructive pulmonary disease) (HCC)     Dr. Baird Lyons  . Depression   . Anxiety   . Hyperlipidemia   . Chronic insomnia   . Gout   . GERD (gastroesophageal reflux disease)   . PVD (peripheral vascular disease) (HCC)     PTA/Stent right common iliac  . DDD (degenerative disc disease)   . Hx of colonoscopy   . COPD with asthma ([place]) 09/08/2007  . OSA (obstructive sleep apnea)     NPSG 09/10/10- AHI 11.3/hr  . Hypertension     dr Percival Spanish  . History of radiation therapy 11/10/13- 12/29/13    left lung 6600 cGy in 33 sessions  . Diabetes mellitus without complication ([place]) 0/30/0923  . Cancer ([place])     lung  . Lung cancer ([place])  10/04/13    LUL squamous cell lung cancer  . History of cardiovascular stress test     Myoview 7/16:  Diaphragmatic attenuation, no ischemia, EF 56%; Low Risk   Past Surgical History  Procedure Laterality Date  . Spinal fusion  03/05/2007    L4-L5  . Hip surgery      left 'bone graft taken"  . Arm surgery      left elbow  . Shoulder surgery      right  . C-spine surgery    . Angioplasty    . Video bronchoscopy with endobronchial navigation N/A 10/04/2013    Procedure: VIDEO BRONCHOSCOPY WITH ENDOBRONCHIAL NAVIGATION;  Surgeon: Collene Gobble, MD;  Location: [facility];  Service: Thoracic;  Laterality: N/A;  . Back surgery      lower  . Anterior fusion cervical spine      cervical fusion  7 yrs ago ([facility])  . Colon surgery      '11 "Diverticulitis"  . Colonoscopy with propofol N/A 05/22/2015    Procedure: COLONOSCOPY WITH PROPOFOL;  Surgeon: Inda Castle, MD;  Location: [facility];  Service: Endoscopy;  Laterality: N/A;  . Peripheral vascular catheterization N/A 08/10/2015    Procedure: Lower Extremity Angiography;  Surgeon: Lorretta Harp, MD; Distal Ao OK, L-EIA stent OK, R-CIA 100% s/p overlapping 7 mm x 38 mm ICast stents, 50-60% R-CFA  Social History:  reports that he quit smoking about 2 years ago. His smoking use included Cigarettes. He has a 21.5 pack-year smoking history. He has never used smokeless tobacco. He reports that he does not drink alcohol or use illicit drugs.  Allergies  Allergen Reactions  . Carboplatin Shortness Of Breath, Swelling and Rash    Swelling of lips, rash on face,eyes and head    Family History  Problem Relation Age of Onset  . Heart attack Mother   . Heart attack Sister   . Lung cancer Sister   . Cancer Sister     small cell lung, mets to brain  . Emphysema Sister   . Hypertension Brother   . Stroke Neg Hx      Prior to Admission medications   Medication Sig Start Date End Date Taking? Authorizing Provider  albuterol (PROVENTIL)  (2.5 MG/3ML) 0.083% nebulizer solution INHALE 1 VIAL IN NEBULIZER EVERY 4 HOURS AS NEEDED FOR WHEEZING OR SHORTNESS OF BREATH 01/19/15  Yes Deneise Lever, MD  ALPRAZolam Duanne Moron) 0.25 MG tablet Take 0.25 mg by mouth daily as needed for anxiety.  03/10/14  Yes Doe-Hyun R Shawna Orleans, DO  arformoterol (BROVANA) 15 MCG/2ML NEBU Take 2 mLs (15 mcg total) by nebulization 2 (two) times daily. 09/22/15  Yes Deneise Lever, MD  ARIPiprazole (ABILIFY) 5 MG tablet Take 1 tablet (5 mg total) by mouth at bedtime. 03/30/13  Yes Doe-Hyun R Shawna Orleans, DO  Armodafinil (NUVIGIL) 150 MG tablet Take 150 mg by mouth every morning.    Yes Historical Provider, MD  aspirin 81 MG EC tablet TAKE 1 TABLET EVERY DAY 05/19/15  Yes Marletta Lor, MD  atorvastatin (LIPITOR) 20 MG tablet TAKE 1 TABLET EVERY DAY 08/01/15  Yes Dorena Cookey, MD  clonazePAM (KLONOPIN) 1 MG tablet Take 2 mg by mouth at bedtime as needed for anxiety.    Yes Historical Provider, MD  clopidogrel (PLAVIX) 75 MG tablet Take 1 tablet (75 mg total) by mouth daily. 08/11/15  Yes Almyra Deforest, PA  esomeprazole (NEXIUM) 40 MG capsule Take 40 mg by mouth every morning.  06/20/14  Yes Doe-Hyun R Shawna Orleans, DO  finasteride (PROSCAR) 5 MG tablet Take 5 mg by mouth daily.  10/28/15  Yes Historical Provider, MD  FLUoxetine (PROZAC) 40 MG capsule Take 40 mg by mouth every morning.   Yes Historical Provider, MD  Fluticasone-Salmeterol (ADVAIR DISKUS) 500-50 MCG/DOSE AEPB 1 puff then rinse mouth, twice daily maintenance 01/19/15  Yes Deneise Lever, MD  gabapentin (NEURONTIN) 100 MG capsule Take 100 mg by mouth daily.  07/15/15  Yes Historical Provider, MD  isosorbide mononitrate (IMDUR) 60 MG 24 hr tablet Take 1 tablet (60 mg total) by mouth daily. 11/11/14  Yes Minus Breeding, MD  methocarbamol (ROBAXIN) 500 MG tablet Take 500 mg by mouth 2 (two) times daily as needed for muscle spasms.  10/27/13  Yes Historical Provider, MD  metoprolol succinate (TOPROL-XL) 25 MG 24 hr tablet TAKE 1/2 TABLET  BY MOUTH DAILY 07/27/15  Yes Minus Breeding, MD  mirtazapine (REMERON) 45 MG tablet Take 1 tablet (45 mg total) by mouth at bedtime. 12/29/14  Yes Tammy S Parrett, NP  morphine (MS CONTIN) 30 MG 12 hr tablet Take 1 tablet (30 mg total) by mouth every 12 (twelve) hours. 10/31/15  Yes Janith Lima, MD  morphine (MSIR) 15 MG tablet Take 1 tablet (15 mg total) by mouth every 4 (four) hours as needed for severe pain. 10/31/15  Yes  Janith Lima, MD  nitroGLYCERIN (NITROSTAT) 0.4 MG SL tablet Place 1 tablet (0.4 mg total) under the tongue every 5 (five) minutes as needed for chest pain. 08/09/14  Yes Minus Breeding, MD  potassium chloride SA (K-DUR,KLOR-CON) 20 MEQ tablet Take 20 mEq by mouth every morning.    Yes Historical Provider, MD  predniSONE (DELTASONE) 5 MG tablet Take 5 mg by mouth daily. 10/19/15  Yes Historical Provider, MD  prochlorperazine (COMPAZINE) 10 MG tablet TAKE 1 TABLET BY MOUTH EVERY 6 HOURS AS NEEDED FOR NAUSEA AND VOMITING 09/22/15  Yes Curt Bears, MD  STIOLTO RESPIMAT 2.5-2.5 MCG/ACT AERS INHALE 2 PUFFS INTO THE LUNGS DAILY. 06/13/15  Yes Historical Provider, MD  tamsulosin (FLOMAX) 0.4 MG CAPS capsule Take 0.4 mg by mouth daily. 10/28/15  Yes Historical Provider, MD  theophylline (UNIPHYL) 400 MG 24 hr tablet Take 400 mg by mouth daily. 11/13/15  Yes Historical Provider, MD  VENTOLIN HFA 108 (90 BASE) MCG/ACT inhaler INHALE 2 PUFFS INTO THE LUNGS 4 TIMES DAILY AS NEEDED FOR WHEEZING 04/07/15  Yes Deneise Lever, MD  VOLTAREN 1 % GEL Apply 2 g topically daily as needed (for pain).  06/24/12  Yes Historical Provider, MD   Physical Exam: Filed Vitals:   11/24/15 0415 11/24/15 0445  BP: 115/79 120/79  Pulse: 89 90  Temp:    Resp: 15 21    BP 120/79 mmHg  Pulse 90  Temp(Src) 98.2 F (36.8 C) (Temporal)  Resp 21  SpO2 100%  General Appearance:    Alert, oriented, no distress on BIPAP, appears stated age  Head:    Normocephalic, atraumatic  Eyes:    PERRL, EOMI, sclera  non-icteric        Nose:   Nares without drainage or epistaxis. Mucosa, turbinates normal  Throat:   Moist mucous membranes. Oropharynx without erythema or exudate.  Neck:   Supple. No carotid bruits.  No thyromegaly.  No lymphadenopathy.   Back:     No CVA tenderness, no spinal tenderness  Lungs:     Clear to auscultation bilaterally, without wheezes, rhonchi or rales  Chest wall:    No tenderness to palpitation  Heart:    Regular rate and rhythm without murmurs, gallops, rubs  Abdomen:     Soft, non-tender, nondistended, normal bowel sounds, no organomegaly  Genitalia:    deferred  Rectal:    deferred  Extremities:   No clubbing, cyanosis or edema.  Pulses:   2+ and symmetric all extremities  Skin:   Skin color, texture, turgor normal, no rashes or lesions  Lymph nodes:   Cervical, supraclavicular, and axillary nodes normal  Neurologic:   CNII-XII intact. Normal strength, sensation and reflexes      throughout    Labs on Admission:  Basic Metabolic Panel:  Recent Labs Lab 11/24/15 0154  NA 138  K 4.5  CL 100*  CO2 23  GLUCOSE 271*  BUN 10  CREATININE 1.23  CALCIUM 9.3   Liver Function Tests:  Recent Labs Lab 11/24/15 0154  AST 55*  ALT 36  ALKPHOS 158*  BILITOT 0.2*  PROT 6.2*  ALBUMIN 3.8   No results for input(s): LIPASE, AMYLASE in the last 168 hours. No results for input(s): AMMONIA in the last 168 hours. CBC:  Recent Labs Lab 11/24/15 0154  WBC 26.0*  NEUTROABS 15.3*  HGB 16.1  HCT 51.0  MCV 95.7  PLT 221   Cardiac Enzymes: No results for input(s): CKTOTAL, CKMB, CKMBINDEX, TROPONINI in  the last 168 hours.  BNP (last 3 results) No results for input(s): PROBNP in the last 8760 hours. CBG: No results for input(s): GLUCAP in the last 168 hours.  Radiological Exams on Admission: Dg Chest Port 1 View  11/24/2015  CLINICAL DATA:  59 year old male with shortness of breath EXAM: PORTABLE CHEST 1 VIEW COMPARISON:  Radiograph dated 10/05/2015  FINDINGS: Single-view of the chest demonstrate stable area of opacity at the left attacks, likely atelectasis/ scarring. Bibasilar atelectatic changes noted. There is no other focal consolidation, pleural effusion, or pneumothorax. Stable cardiac silhouette. Cervical fixation plate and screws noted. IMPRESSION: Left apical opacity, compatible with known scarring. No acute cardiopulmonary process. Electronically Signed   By: Anner Crete M.D.   On: 11/24/2015 02:02    EKG: Independently reviewed.  Assessment/Plan Principal Problem:   Acute on chronic respiratory failure with hypoxia and hypercapnia (HCC) Active Problems:   COPD with asthma (HCC)   Diabetes mellitus without complication (HCC)   COPD exacerbation ([place])   1. Acute on chronic respiratory failure with hypercapnia and hypoxia - 1. Primarily hypercapnic component 2. Thankfully despite very bad initial ABGs, appears to have stabilized on BIPAP for now 3. As I told him and wife, still very tenuous and he could easily wind up intubated, patient high intubation risk. 4. IV solumedrol 5. Adult wheeze protocol 6. No evidence of PNA on CXR, no fever, will hold off on ABX for now except for azithromycin for COPD exacerbation. 7. Admitting to SDU 2. DM - Q4H sensitive scale SSI while on steroids 3. HTN - continue home meds    Code Status: Discussed with patient and wife, for now they wish to be FULL CODE  Family Communication: Family at bedside Disposition Plan: Admit to inpatient   Time spent: 70 min  Yazen Rosko M. [facility] Pager [phone]  If 7AM-7PM, please contact the day team taking care of the patient [email or web address] Password [email or web address] 11/24/2015, 4:51 AM

## 2015-11-25 ENCOUNTER — Inpatient Hospital Stay (HOSPITAL_COMMUNITY): Payer: Commercial Managed Care - HMO

## 2015-11-25 LAB — BLOOD GAS, ARTERIAL
Acid-Base Excess: 3.4 mmol/L — ABNORMAL HIGH (ref 0.0–2.0)
BICARBONATE: 29.1 meq/L — AB (ref 20.0–24.0)
O2 Content: 2.5 L/min
O2 Saturation: 98.3 %
PATIENT TEMPERATURE: 98.6
PCO2 ART: 58.4 mmHg — AB (ref 35.0–45.0)
PH ART: 7.318 — AB (ref 7.350–7.450)
PO2 ART: 123 mmHg — AB (ref 80.0–100.0)
TCO2: 30.9 mmol/L (ref 0–100)

## 2015-11-25 LAB — CBC WITH DIFFERENTIAL/PLATELET
BASOS PCT: 0 %
Basophils Absolute: 0 10*3/uL (ref 0.0–0.1)
EOS ABS: 0 10*3/uL (ref 0.0–0.7)
EOS PCT: 0 %
HCT: 43 % (ref 39.0–52.0)
HEMOGLOBIN: 13.7 g/dL (ref 13.0–17.0)
Lymphocytes Relative: 4 %
Lymphs Abs: 0.7 10*3/uL (ref 0.7–4.0)
MCH: 29.8 pg (ref 26.0–34.0)
MCHC: 31.9 g/dL (ref 30.0–36.0)
MCV: 93.7 fL (ref 78.0–100.0)
MONOS PCT: 3 %
Monocytes Absolute: 0.5 10*3/uL (ref 0.1–1.0)
NEUTROS PCT: 93 %
Neutro Abs: 15.2 10*3/uL — ABNORMAL HIGH (ref 1.7–7.7)
PLATELETS: 117 10*3/uL — AB (ref 150–400)
RBC: 4.59 MIL/uL (ref 4.22–5.81)
RDW: 15 % (ref 11.5–15.5)
WBC: 16.4 10*3/uL — ABNORMAL HIGH (ref 4.0–10.5)

## 2015-11-25 LAB — COMPREHENSIVE METABOLIC PANEL
ALK PHOS: 113 U/L (ref 38–126)
ALT: 37 U/L (ref 17–63)
AST: 33 U/L (ref 15–41)
Albumin: 3 g/dL — ABNORMAL LOW (ref 3.5–5.0)
Anion gap: 8 (ref 5–15)
BUN: 15 mg/dL (ref 6–20)
CALCIUM: 9.1 mg/dL (ref 8.9–10.3)
CO2: 28 mmol/L (ref 22–32)
CREATININE: 0.95 mg/dL (ref 0.61–1.24)
Chloride: 104 mmol/L (ref 101–111)
Glucose, Bld: 191 mg/dL — ABNORMAL HIGH (ref 65–99)
Potassium: 4.8 mmol/L (ref 3.5–5.1)
SODIUM: 140 mmol/L (ref 135–145)
Total Bilirubin: 0.4 mg/dL (ref 0.3–1.2)
Total Protein: 5.6 g/dL — ABNORMAL LOW (ref 6.5–8.1)

## 2015-11-25 LAB — GLUCOSE, CAPILLARY
GLUCOSE-CAPILLARY: 168 mg/dL — AB (ref 65–99)
GLUCOSE-CAPILLARY: 199 mg/dL — AB (ref 65–99)
GLUCOSE-CAPILLARY: 253 mg/dL — AB (ref 65–99)
Glucose-Capillary: 178 mg/dL — ABNORMAL HIGH (ref 65–99)
Glucose-Capillary: 139 mg/dL — ABNORMAL HIGH (ref 65–99)
Glucose-Capillary: 174 mg/dL — ABNORMAL HIGH (ref 65–99)
Glucose-Capillary: 201 mg/dL — ABNORMAL HIGH (ref 65–99)
Glucose-Capillary: 102 mg/dL — ABNORMAL HIGH (ref 65–99)

## 2015-11-25 LAB — MAGNESIUM: MAGNESIUM: 2.1 mg/dL (ref 1.7–2.4)

## 2015-11-25 LAB — URINE CULTURE: Culture: 4000

## 2015-11-25 LAB — LACTIC ACID, PLASMA
LACTIC ACID, VENOUS: 2.1 mmol/L — AB (ref 0.5–2.0)
Lactic Acid, Venous: 2.7 mmol/L (ref 0.5–2.0)

## 2015-11-25 LAB — THEOPHYLLINE LEVEL: Theophylline Lvl: 4.3 ug/mL — ABNORMAL LOW (ref 10.0–20.0)

## 2015-11-25 MED ORDER — INSULIN ASPART 100 UNIT/ML ~~LOC~~ SOLN
0.0000 [IU] | SUBCUTANEOUS | Status: DC
  Administered 2015-11-25: 4 [IU] via SUBCUTANEOUS
  Administered 2015-11-25: 3 [IU] via SUBCUTANEOUS
  Administered 2015-11-26: 4 [IU] via SUBCUTANEOUS
  Administered 2015-11-26: 7 [IU] via SUBCUTANEOUS
  Administered 2015-11-26 (×2): 4 [IU] via SUBCUTANEOUS
  Administered 2015-11-26: 3 [IU] via SUBCUTANEOUS
  Administered 2015-11-27 (×2): 4 [IU] via SUBCUTANEOUS

## 2015-11-25 NOTE — Progress Notes (Signed)
[place] TEAM 1 - Stepdown/ICU TEAM Progress Note  Ernest [name] VQQ:[id number] DOB: 03-02-1956 DOA: 11/24/2015 PCP: [name], MD  Admit HPI / Brief Narrative: 59 y.o. 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 Desaturated en route to ED per EMS. 2 neb treatments and 125 mg solumedrol given en route. He was essentially unresponsive on arrival. He was placed on BIPAP, and thankfully responded, his initial ABG of 7.075/108/240/30 fio2 100%, has improved to 7.239/69/117/29. The patient is now awake, says he is breathing "much better", and answering questions while on BIPAP.  HPI/Subjective: 12 /31  A/O 4, states on home O2 4 L 24 hours per day. States last XRT + chemotherapy 2015. Oncologist is Dr. Curt Bears, Follows up q 3 months   Assessment/Plan: Acute on chronic respiratory failure with hypercapnia and hypoxia/COPD exacerbation -positive Strep pneumo urine antigen,  -12/30 Theophylline level= 4.3 slightly low  Squamous Cell Lung Ca -Contact Dr. Curt Bears in the Am. to inform him of admission of his patient  Sepsis -Patient meets criteria for sepsis HR> 90, RR> 20,WBC> 12; possible source of infection COPD exacerbation vs CAP -positive Strep pneumo urine antigen,  - Legionella urine antigen pending -Respiratory virus panel pending -Influenza panel negative -Sputum pending -Continue normal saline 114m/hr -DuoNeb QID -Flutter valve -Physiotherapy vest BID -Mucinex DM BID -PCXR in A.m.  DM Type 2 controlled - Resistant SSI while on steroids -11/10 Hemoglobin A1c= 6.2              Code Status: FULL Family Communication: Wife present at time of exam Disposition Plan: Resolution sepsis    Consultants:   Procedure/Significant Events: 5/19 echocardiogram;- LVEF=  55%-60%. - MV: mild regurgitation. - Left atrium: mildly dilated.-- Right ventricle: mildly dilated. - Right atrium: mildly dilated.   Culture 12/30 urine negative final 12/30 Rt AC/forearm NGTD  12/30 positive Strep pneumo urine antigen,  12/30 Legionella urine antigen pending 12/30 Respiratory virus panel pending 12/30 Influenza A/B/H1N1 negative  12/30Sputum will need to be re-collected 12/30 MRSA by PCR negative    Antibiotics: Azithromycin 12/30>> Ceftriaxone 12/30>>   DVT prophylaxis: Heparin subcutaneous   Devices    LINES / TUBES:      Continuous Infusions: . sodium chloride 125 mL/hr at 11/24/15 1451    Objective: VITAL SIGNS: Temp: 97.1 F (36.2 C) (12/31 0417) Temp Source: Oral (12/31 0417) BP: 114/69 mmHg (12/31 0417) Pulse Rate: 62 (12/31 0417) SPO2; FIO2:   Intake/Output Summary (Last 24 hours) at 11/25/15 0630 Last data filed at 11/25/15 0459  Gross per 24 hour  Intake 1026.83 ml  Output   1925 ml  Net -898.17 ml     Exam: General: A/O 4, positive acute respiratory distress (now off BiPAP) Eyes: Negative headache, negative scleral hemorrhage ENT: Negative Runny nose, negative ear pain, negative gingival bleeding, Neck:  Negative scars, masses, torticollis, lymphadenopathy, JVD Lungs: diffuse positive expiratory/expiratory wheeze, positive purulent yellow/green sputum, negative crackles Cardiovascular: Tachycardic, Regular rhythm without murmur gallop or rub normal S1 and S2 Abdomen:negative abdominal pain, nondistended, positive soft, bowel sounds, no rebound, no ascites, no appreciable mass Extremities: No significant cyanosis, clubbing, or edema bilateral lower extremities Psychiatric:  Negative depression, negative anxiety, negative fatigue, negative mania  Neurologic:  Cranial nerves II through XII intact, tongue/uvula midline, all extremities muscle strength 5/5,  sensation intact throughout, negative dysarthria, negative  expressive aphasia, negative receptive aphasia.   Data Reviewed: Basic Metabolic Panel:  Recent Labs Lab 11/24/15 0154  NA 138  K 4.5  CL 100*  CO2 23  GLUCOSE 271*  BUN 10  CREATININE 1.23  CALCIUM 9.3   Liver Function Tests:  Recent Labs Lab 11/24/15 0154  AST 55*  ALT 36  ALKPHOS 158*  BILITOT 0.2*  PROT 6.2*  ALBUMIN 3.8   No results for input(s): LIPASE, AMYLASE in the last 168 hours. No results for input(s): AMMONIA in the last 168 hours. CBC:  Recent Labs Lab 11/24/15 0154  WBC 26.0*  NEUTROABS 15.3*  HGB 16.1  HCT 51.0  MCV 95.7  PLT 221   Cardiac Enzymes: No results for input(s): CKTOTAL, CKMB, CKMBINDEX, TROPONINI in the last 168 hours. BNP (last 3 results)  Recent Labs  04/11/15 0235 11/24/15 0154  BNP 305.2* 211.9*    ProBNP (last 3 results) No results for input(s): PROBNP in the last 8760 hours.  CBG:  Recent Labs Lab 11/24/15 1252 11/24/15 1647 11/24/15 2110 11/25/15 0107 11/25/15 0419  GLUCAP 174* 150* 178* 139* 174*    Recent Results (from the past 240 hour(s))  MRSA PCR Screening     Status: None   Collection Time: 11/24/15  7:03 AM  Result Value Ref Range Status   MRSA by PCR NEGATIVE NEGATIVE Final    Comment:        The GeneXpert MRSA Assay (FDA approved for NASAL specimens only), is one component of a comprehensive MRSA colonization surveillance program. It is not intended to diagnose MRSA infection nor to guide or monitor treatment for MRSA infections.   Culture, blood (routine x 2)     Status: None (Preliminary result)   Collection Time: 11/24/15 12:50 PM  Result Value Ref Range Status   Specimen Description BLOOD BLOOD RIGHT FOREARM  Final   Special Requests BOTTLES DRAWN AEROBIC AND ANAEROBIC 5CC  Final   Culture PENDING  Incomplete   Report Status PENDING  Incomplete  Culture, expectorated sputum-assessment     Status: None   Collection Time: 11/24/15  5:45 PM  Result Value Ref Range Status    Specimen Description SPUTUM  Final   Special Requests NONE  Final   Sputum evaluation   Final    MICROSCOPIC FINDINGS SUGGEST THAT THIS SPECIMEN IS NOT REPRESENTATIVE OF LOWER RESPIRATORY SECRETIONS. PLEASE RECOLLECT. Gram Stain Report Called to,Read Back By and Verified With: C WOODARD '@[email or web address]'$  11/24/15 MKELLY    Report Status 11/24/2015 FINAL  Final     Studies:  Recent x-ray studies have been reviewed in detail by the Attending Physician  Scheduled Meds:  Scheduled Meds: . antiseptic oral rinse  7 mL Mouth Rinse BID  . arformoterol  15 mcg Nebulization BID  . ARIPiprazole  5 mg Oral QHS  . aspirin EC  81 mg Oral Daily  . atorvastatin  20 mg Oral q1800  . azithromycin  500 mg Intravenous Q24H  . cefTRIAXone (ROCEPHIN)  IV  1 g Intravenous Q24H  . clopidogrel  75 mg Oral Daily  . dextromethorphan-guaiFENesin  1 tablet Oral BID  . finasteride  5 mg Oral Daily  . FLUoxetine  40 mg Oral q morning - 10a  . gabapentin  100 mg Oral Daily  . heparin  5,000 Units Subcutaneous 3 times per day  . insulin aspart  0-15 Units Subcutaneous 6 times per day  . ipratropium-albuterol  3 mL Nebulization Q6H  .  isosorbide mononitrate  60 mg Oral Daily  . methylPREDNISolone (SOLU-MEDROL) injection  60 mg Intravenous Q12H  . metoprolol succinate  12.5 mg Oral Daily  . mirtazapine  45 mg Oral QHS  . modafinil  100 mg Oral Daily  . mometasone-formoterol  2 puff Inhalation BID  . morphine  30 mg Oral Q12H  . pantoprazole  80 mg Oral Q1200  . potassium chloride SA  20 mEq Oral q morning - 10a  . sodium chloride  3 mL Intravenous Q12H  . tamsulosin  0.4 mg Oral Daily  . theophylline  400 mg Oral Daily    Time spent on care of this patient: 40 mins   Evander Macaraeg, Geraldo Docker , MD  [facility] Office  [phone] Pager [phone]  On-Call/Text Page:      [email or web address]      password [email or web address]  If 7PM-7AM, please contact night-coverage [email or web address] Password [email or web address] 11/25/2015, 6:30 AM   LOS: 1  day   Care during the described time interval was provided by me .  I have reviewed this patient's available data, including medical history, events of note, physical examination, and all test results as part of my evaluation. I have personally reviewed and interpreted all radiology studies.   Dia Crawford, MD [phone] Pager

## 2015-11-26 LAB — BASIC METABOLIC PANEL
Anion gap: 9 (ref 5–15)
BUN: 13 mg/dL (ref 6–20)
CALCIUM: 8.9 mg/dL (ref 8.9–10.3)
CO2: 29 mmol/L (ref 22–32)
CREATININE: 0.88 mg/dL (ref 0.61–1.24)
Chloride: 101 mmol/L (ref 101–111)
GFR calc Af Amer: >60 mL/min (ref >60)
GFR calc non Af Amer: >60 mL/min (ref >60)
GLUCOSE: 209 mg/dL — AB (ref 65–99)
Potassium: 4.4 mmol/L (ref 3.5–5.1)
Sodium: 139 mmol/L (ref 135–145)

## 2015-11-26 LAB — CBC WITH DIFFERENTIAL/PLATELET
Basophils Absolute: 0 10*3/uL (ref 0.0–0.1)
Basophils Relative: 0 %
EOS ABS: 0 10*3/uL (ref 0.0–0.7)
EOS PCT: 0 %
HCT: 43.8 % (ref 39.0–52.0)
Hemoglobin: 13.7 g/dL (ref 13.0–17.0)
LYMPHS ABS: 0.5 10*3/uL — AB (ref 0.7–4.0)
Lymphocytes Relative: 4 %
MCH: 29.4 pg (ref 26.0–34.0)
MCHC: 31.3 g/dL (ref 30.0–36.0)
MCV: 94 fL (ref 78.0–100.0)
MONO ABS: 0.2 10*3/uL (ref 0.1–1.0)
MONOS PCT: 2 %
Neutro Abs: 12.9 10*3/uL — ABNORMAL HIGH (ref 1.7–7.7)
Neutrophils Relative %: 95 %
PLATELETS: 103 10*3/uL — AB (ref 150–400)
RBC: 4.66 MIL/uL (ref 4.22–5.81)
RDW: 15 % (ref 11.5–15.5)
WBC: 13.7 10*3/uL — ABNORMAL HIGH (ref 4.0–10.5)

## 2015-11-26 LAB — GLUCOSE, CAPILLARY
GLUCOSE-CAPILLARY: 133 mg/dL — AB (ref 65–99)
GLUCOSE-CAPILLARY: 179 mg/dL — AB (ref 65–99)
Glucose-Capillary: 167 mg/dL — ABNORMAL HIGH (ref 65–99)
Glucose-Capillary: 200 mg/dL — ABNORMAL HIGH (ref 65–99)
Glucose-Capillary: 207 mg/dL — ABNORMAL HIGH (ref 65–99)

## 2015-11-26 LAB — MAGNESIUM: Magnesium: 2.2 mg/dL (ref 1.7–2.4)

## 2015-11-26 NOTE — Progress Notes (Signed)
[place] TEAM 1 - Stepdown/ICU TEAM Progress Note  Ernest [name] IEP:[id number] DOB: 05-08-1956 DOA: 11/24/2015 PCP: [name], MD  Admit HPI / Brief Narrative: 60 y.o. 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 Desaturated en route to ED per EMS. 2 neb treatments and 125 mg solumedrol given en route. He was essentially unresponsive on arrival. He was placed on BIPAP, and thankfully responded, his initial ABG of 7.075/108/240/30 fio2 100%, has improved to 7.239/69/117/29. The patient is now awake, says he is breathing "much better", and answering questions while on BIPAP.  HPI/Subjective: 1/1  A/O 4, states on home O2 4 L 24 hours per day. States last XRT + chemotherapy 2015. Oncologist is Dr. Curt Bears, Follows up q 3 months   Assessment/Plan: Acute on chronic respiratory failure with hypercapnia and hypoxia/COPD exacerbation -positive Strep pneumo urine antigen,  -12/30 Theophylline level= 4.3 slightly low  Squamous Cell Lung Ca -Contact Dr. Curt Bears on Monday, courtesy call  Sepsis pneumonia -Patient meets criteria for sepsis HR> 90, RR> 20,WBC> 12; possible source of infection COPD exacerbation vs CAP -positive Strep pneumo urine antigen,  - Legionella urine antigen pending -Respiratory virus panel pending -Influenza panel negative -Sputum pending -Continue normal saline 148m/hr -Brovana BID -Solu-Medrol 60 mg BID  -DuoNeb QID -Flutter valve -Physiotherapy vest BID; if vest ineffective consider Mucomyst nebulizer -Mucinex DM BID -PCXR in A.m.  DM Type 2 controlled - Resistant SSI while on steroids -11/10 Hemoglobin A1c= 6.2              Code Status: FULL Family Communication: Wife present at time of exam Disposition Plan: Resolution  sepsis    Consultants:   Procedure/Significant Events: 5/19 echocardiogram;- LVEF= 55%-60%. - MV: mild regurgitation. - Left atrium: mildly dilated.-- Right ventricle: mildly dilated. - Right atrium: mildly dilated.   Culture 12/30 urine negative final 12/30 Rt AC/forearm NGTD  12/30 positive Strep pneumo urine antigen,  12/30 Legionella urine antigen pending 12/30 Respiratory virus panel pending 12/30 Influenza A/B/H1N1 negative  12/30Sputum will need to be re-collected 12/30 MRSA by PCR negative    Antibiotics: Azithromycin 12/30>> Ceftriaxone 12/30>>   DVT prophylaxis: Heparin subcutaneous   Devices    LINES / TUBES:      Continuous Infusions: . sodium chloride 125 mL/hr at 11/26/15 1120    Objective: VITAL SIGNS: Temp: 98.5 F (36.9 C) (01/01 1218) Temp Source: Oral (01/01 1218) BP: 141/86 mmHg (01/01 1218) Pulse Rate: 71 (01/01 1218) SPO2; FIO2:   Intake/Output Summary (Last 24 hours) at 11/26/15 1356 Last data filed at 11/26/15 0800  Gross per 24 hour  Intake 2849.58 ml  Output   2475 ml  Net 374.58 ml     Exam: General: A/O 4, positive acute respiratory distress (now off BiPAP) Eyes: Negative headache, negative scleral hemorrhage ENT: Negative Runny nose, negative ear pain, negative gingival bleeding, Neck:  Negative scars, masses, torticollis, lymphadenopathy, JVD Lungs: diffuse positive expiratory/inspiratory wheeze (decreasing), mild rhonchi bilateral apices Lt>>Rt. Bronchial mucus patient unable to expectorate, negative crackles Cardiovascular: Regular rhythm and rate, without murmur gallop or rub normal S1 and S2 Abdomen:negative abdominal pain, nondistended, positive soft, bowel sounds, no rebound, no ascites, no appreciable mass Extremities: No significant cyanosis, clubbing, or edema bilateral lower extremities Psychiatric:  Negative depression, negative anxiety, negative fatigue, negative mania  Neurologic:  Cranial nerves II  through XII intact, tongue/uvula midline, all extremities muscle strength 5/5, sensation intact throughout, negative dysarthria, negative expressive aphasia, negative receptive aphasia.   Data Reviewed: Basic Metabolic Panel:  Recent Labs Lab 11/24/15 0154 11/25/15 0710 11/26/15 0925  NA 138 140 139  K 4.5 4.8 4.4  CL 100* 104 101  CO2 '23 28 29  '$ GLUCOSE 271* 191* 209*  BUN '10 15 13  '$ CREATININE 1.23 0.95 0.88  CALCIUM 9.3 9.1 8.9  MG  --  2.1 2.2   Liver Function Tests:  Recent Labs Lab 11/24/15 0154 11/25/15 0710  AST 55* 33  ALT 36 37  ALKPHOS 158* 113  BILITOT 0.2* 0.4  PROT 6.2* 5.6*  ALBUMIN 3.8 3.0*   No results for input(s): LIPASE, AMYLASE in the last 168 hours. No results for input(s): AMMONIA in the last 168 hours. CBC:  Recent Labs Lab 11/24/15 0154 11/25/15 0710 11/26/15 0925  WBC 26.0* 16.4* 13.7*  NEUTROABS 15.3* 15.2* 12.9*  HGB 16.1 13.7 13.7  HCT 51.0 43.0 43.8  MCV 95.7 93.7 94.0  PLT 221 117* 103*   Cardiac Enzymes: No results for input(s): CKTOTAL, CKMB, CKMBINDEX, TROPONINI in the last 168 hours. BNP (last 3 results)  Recent Labs  04/11/15 0235 11/24/15 0154  BNP 305.2* 211.9*    ProBNP (last 3 results) No results for input(s): PROBNP in the last 8760 hours.  CBG:  Recent Labs Lab 11/25/15 2117 11/25/15 2356 11/26/15 0434 11/26/15 0810 11/26/15 1217  GLUCAP 199* 168* 133* 179* 167*    Recent Results (from the past 240 hour(s))  MRSA PCR Screening     Status: None   Collection Time: 11/24/15  7:03 AM  Result Value Ref Range Status   MRSA by PCR NEGATIVE NEGATIVE Final    Comment:        The GeneXpert MRSA Assay (FDA approved for NASAL specimens only), is one component of a comprehensive MRSA colonization surveillance program. It is not intended to diagnose MRSA infection nor to guide or monitor treatment for MRSA infections.   Culture, blood (routine x 2)     Status: None (Preliminary result)   Collection  Time: 11/24/15 12:25 PM  Result Value Ref Range Status   Specimen Description BLOOD RIGHT ANTECUBITAL  Final   Special Requests BOTTLES DRAWN AEROBIC AND ANAEROBIC 5CC  Final   Culture NO GROWTH < 24 HOURS  Final   Report Status PENDING  Incomplete  Culture, blood (routine x 2)     Status: None (Preliminary result)   Collection Time: 11/24/15 12:50 PM  Result Value Ref Range Status   Specimen Description BLOOD BLOOD RIGHT FOREARM  Final   Special Requests BOTTLES DRAWN AEROBIC AND ANAEROBIC 5CC  Final   Culture NO GROWTH < 24 HOURS  Final   Report Status PENDING  Incomplete  Culture, Urine     Status: None   Collection Time: 11/24/15  2:00 PM  Result Value Ref Range Status   Specimen Description URINE, RANDOM  Final   Special Requests NONE  Final   Culture 4,000 COLONIES/mL INSIGNIFICANT GROWTH  Final   Report Status 11/25/2015 FINAL  Final  Culture, expectorated sputum-assessment     Status: None   Collection Time: 11/24/15  5:45 PM  Result Value Ref Range Status   Specimen Description SPUTUM  Final   Special Requests NONE  Final   Sputum evaluation   Final    MICROSCOPIC FINDINGS SUGGEST THAT THIS SPECIMEN IS NOT REPRESENTATIVE  OF LOWER RESPIRATORY SECRETIONS. PLEASE RECOLLECT. Gram Stain Report Called to,Read Back By and Verified With: C WOODARD '@[email or web address]'$  11/24/15 MKELLY    Report Status 11/24/2015 FINAL  Final     Studies:  Recent x-ray studies have been reviewed in detail by the Attending Physician  Scheduled Meds:  Scheduled Meds: . antiseptic oral rinse  7 mL Mouth Rinse BID  . arformoterol  15 mcg Nebulization BID  . ARIPiprazole  5 mg Oral QHS  . aspirin EC  81 mg Oral Daily  . atorvastatin  20 mg Oral q1800  . azithromycin  500 mg Intravenous Q24H  . cefTRIAXone (ROCEPHIN)  IV  1 g Intravenous Q24H  . clopidogrel  75 mg Oral Daily  . dextromethorphan-guaiFENesin  1 tablet Oral BID  . finasteride  5 mg Oral Daily  . FLUoxetine  40 mg Oral q morning - 10a  .  gabapentin  100 mg Oral Daily  . heparin  5,000 Units Subcutaneous 3 times per day  . insulin aspart  0-20 Units Subcutaneous 6 times per day  . ipratropium-albuterol  3 mL Nebulization Q6H  . isosorbide mononitrate  60 mg Oral Daily  . methylPREDNISolone (SOLU-MEDROL) injection  60 mg Intravenous Q12H  . metoprolol succinate  12.5 mg Oral Daily  . mirtazapine  45 mg Oral QHS  . modafinil  100 mg Oral Daily  . mometasone-formoterol  2 puff Inhalation BID  . morphine  30 mg Oral Q12H  . pantoprazole  80 mg Oral Q1200  . potassium chloride SA  20 mEq Oral q morning - 10a  . sodium chloride  3 mL Intravenous Q12H  . tamsulosin  0.4 mg Oral Daily  . theophylline  400 mg Oral Daily    Time spent on care of this patient: 40 mins   Lorina Duffner, Geraldo Docker , MD  [facility] Office  [phone] Pager [phone]  On-Call/Text Page:      [email or web address]      password [email or web address]  If 7PM-7AM, please contact night-coverage [email or web address] Password [email or web address] 11/26/2015, 1:56 PM   LOS: 2 days   Care during the described time interval was provided by me .  I have reviewed this patient's available data, including medical history, events of note, physical examination, and all test results as part of my evaluation. I have personally reviewed and interpreted all radiology studies.   Dia Crawford, MD [phone] Pager

## 2015-11-27 ENCOUNTER — Other Ambulatory Visit: Payer: Self-pay | Admitting: Internal Medicine

## 2015-11-27 LAB — CBC
HCT: 42.5 % (ref 39.0–52.0)
Hemoglobin: 13.1 g/dL (ref 13.0–17.0)
MCH: 29.1 pg (ref 26.0–34.0)
MCHC: 30.8 g/dL (ref 30.0–36.0)
MCV: 94.4 fL (ref 78.0–100.0)
PLATELETS: 111 10*3/uL — AB (ref 150–400)
RBC: 4.5 MIL/uL (ref 4.22–5.81)
RDW: 15.2 % (ref 11.5–15.5)
WBC: 12.1 10*3/uL — ABNORMAL HIGH (ref 4.0–10.5)

## 2015-11-27 LAB — GLUCOSE, CAPILLARY
GLUCOSE-CAPILLARY: 168 mg/dL — AB (ref 65–99)
GLUCOSE-CAPILLARY: 185 mg/dL — AB (ref 65–99)
GLUCOSE-CAPILLARY: 193 mg/dL — AB (ref 65–99)
GLUCOSE-CAPILLARY: 97 mg/dL (ref 65–99)
Glucose-Capillary: 168 mg/dL — ABNORMAL HIGH (ref 65–99)
Glucose-Capillary: 91 mg/dL (ref 65–99)

## 2015-11-27 LAB — BASIC METABOLIC PANEL
ANION GAP: 11 (ref 5–15)
BUN: 12 mg/dL (ref 6–20)
CALCIUM: 8.9 mg/dL (ref 8.9–10.3)
CO2: 27 mmol/L (ref 22–32)
CREATININE: 0.78 mg/dL (ref 0.61–1.24)
Chloride: 104 mmol/L (ref 101–111)
GFR calc Af Amer: >60 mL/min (ref >60)
GLUCOSE: 130 mg/dL — AB (ref 65–99)
Potassium: 5.7 mmol/L — ABNORMAL HIGH (ref 3.5–5.1)
Sodium: 142 mmol/L (ref 135–145)

## 2015-11-27 LAB — MAGNESIUM: Magnesium: 2.1 mg/dL (ref 1.7–2.4)

## 2015-11-27 LAB — LEGIONELLA ANTIGEN, URINE

## 2015-11-27 MED ORDER — IPRATROPIUM-ALBUTEROL 0.5-2.5 (3) MG/3ML IN SOLN
3.0000 mL | Freq: Four times a day (QID) | RESPIRATORY_TRACT | Status: DC
  Administered 2015-11-27 – 2015-11-30 (×11): 3 mL via RESPIRATORY_TRACT
  Filled 2015-11-27 (×11): qty 3

## 2015-11-27 MED ORDER — METHYLPREDNISOLONE SODIUM SUCC 40 MG IJ SOLR
40.0000 mg | Freq: Two times a day (BID) | INTRAMUSCULAR | Status: DC
  Administered 2015-11-27 – 2015-11-30 (×6): 40 mg via INTRAVENOUS
  Filled 2015-11-27 (×6): qty 1

## 2015-11-27 MED ORDER — INSULIN ASPART 100 UNIT/ML ~~LOC~~ SOLN
0.0000 [IU] | Freq: Three times a day (TID) | SUBCUTANEOUS | Status: DC
  Administered 2015-11-27: 4 [IU] via SUBCUTANEOUS
  Administered 2015-11-28: 7 [IU] via SUBCUTANEOUS
  Administered 2015-11-28 (×2): 3 [IU] via SUBCUTANEOUS
  Administered 2015-11-29: 7 [IU] via SUBCUTANEOUS
  Administered 2015-11-29: 4 [IU] via SUBCUTANEOUS
  Administered 2015-11-29: 3 [IU] via SUBCUTANEOUS
  Administered 2015-11-30: 4 [IU] via SUBCUTANEOUS

## 2015-11-27 MED ORDER — ENOXAPARIN SODIUM 40 MG/0.4ML ~~LOC~~ SOLN
40.0000 mg | SUBCUTANEOUS | Status: DC
  Administered 2015-11-27 – 2015-11-29 (×3): 40 mg via SUBCUTANEOUS
  Filled 2015-11-27 (×2): qty 0.4

## 2015-11-27 MED ORDER — AZITHROMYCIN 500 MG PO TABS: 500.0000 mg | ORAL_TABLET | Freq: Every day | ORAL | Status: DC

## 2015-11-27 NOTE — Progress Notes (Signed)
[place] TEAM 1 - Stepdown/ICU TEAM PROGRESS NOTE  Ernest [name] OEH:[id number] DOB: 1956/10/02 DOA: 11/24/2015 PCP: [name], MD  Admit HPI / Brief Narrative: 60 y.o. 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 Desaturated en route to ED per EMS. 2 neb treatments and 125 mg solumedrol given en route. He was essentially unresponsive on arrival. He was placed on BIPAP, and responded.  His initial ABG of 7.075/108/240/30 fio2 100% improved to 7.239/69/117/29.   HPI/Subjective: The patient is resting comfortably in bed.  He reports he still feels somewhat short of breath but denies severe symptoms.  He denies wheezing, chest pain, nausea vomiting, or abdominal pain.  He states he feels quite weak in general.  Assessment/Plan:  Acute on chronic respiratory failure with hypercapnia and hypoxia / Severe COPD w/ acute exacerbation - Sepsis due to Strep pneumonia -positive Strep pneumo urine antigen - oxygen requirement back to his baseline of 4L - cont abx tx but narrow to cover Strep alone - begin to ambulate  -HR> 90, RR> 20,WBC> 12 at presentation   Squamous Cell Lung CA LUL -no acute issues at this time   DM2  -11/10 A1c 6.2 - CBG reasonably controlled at this time - follow   CAD -asymptomatic   HTN -BP currently well controlled   Code Status: FULL Family Communication: no family present at time of exam Disposition Plan: transfer to med bed - cont abx - begin ambulation/PT/OT - assess sats w/ exertion   Consultants: none  Procedures: none  Antibiotics: Azithromycin 12/30 > 1/2 Ceftriaxone 12/30 >  DVT prophylaxis: SQ heparin   Objective: Blood pressure 146/67, pulse 62, temperature 97.5 F (36.4 C), temperature source Oral, resp. rate 21, height '5\' 6"'$  (1.676 m), weight 91.989 kg (202 lb 12.8 oz),  SpO2 100 %.  Intake/Output Summary (Last 24 hours) at 11/27/15 1144 Last data filed at 11/27/15 1000  Gross per 24 hour  Intake   1720 ml  Output   4800 ml  Net  -3080 ml   Exam: General: No acute respiratory distress at rest in bed  Lungs: Clear to auscultation bilaterally without wheezes or crackles presently  Cardiovascular: Regular rate and rhythm without murmur gallop or rub normal S1 and S2 Abdomen: Nontender, nondistended, soft, bowel sounds positive, no rebound, no ascites, no appreciable mass Extremities: No significant cyanosis, clubbing, or edema bilateral lower extremities  Data Reviewed:  Basic Metabolic Panel:  Recent Labs Lab 11/24/15 0154 11/25/15 0710 11/26/15 0925 11/27/15 0251  NA 138 140 139 142  K 4.5 4.8 4.4 5.7*  CL 100* 104 101 104  CO2 '23 28 29 27  '$ GLUCOSE 271* 191* 209* 130*  BUN '10 15 13 12  '$ CREATININE 1.23 0.95 0.88 0.78  CALCIUM 9.3 9.1 8.9 8.9  MG  --  2.1 2.2 2.1    CBC:  Recent Labs Lab 11/24/15 0154 11/25/15 0710 11/26/15 0925 11/27/15 0251  WBC 26.0* 16.4* 13.7* 12.1*  NEUTROABS 15.3* 15.2* 12.9*  --   HGB 16.1 13.7 13.7 13.1  HCT 51.0 43.0 43.8 42.5  MCV 95.7 93.7 94.0 94.4  PLT 221 117* 103* 111*    Liver Function Tests:  Recent Labs Lab 11/24/15 0154 11/25/15 0710  AST 55* 33  ALT 36 37  ALKPHOS 158* 113  BILITOT 0.2* 0.4  PROT 6.2*  5.6*  ALBUMIN 3.8 3.0*   CBG:  Recent Labs Lab 11/26/15 1620 11/26/15 1953 11/27/15 11/27/15 0515 11/27/15 0752  GLUCAP 200* 207* 97 168* 168*    Recent Results (from the past 240 hour(s))  MRSA PCR Screening     Status: None   Collection Time: 11/24/15  7:03 AM  Result Value Ref Range Status   MRSA by PCR NEGATIVE NEGATIVE Final    Comment:        The GeneXpert MRSA Assay (FDA approved for NASAL specimens only), is one component of a comprehensive MRSA colonization surveillance program. It is not intended to diagnose MRSA infection nor to guide or monitor  treatment for MRSA infections.   Culture, blood (routine x 2)     Status: None (Preliminary result)   Collection Time: 11/24/15 12:25 PM  Result Value Ref Range Status   Specimen Description BLOOD RIGHT ANTECUBITAL  Final   Special Requests BOTTLES DRAWN AEROBIC AND ANAEROBIC 5CC  Final   Culture NO GROWTH 3 DAYS  Final   Report Status PENDING  Incomplete  Culture, blood (routine x 2)     Status: None (Preliminary result)   Collection Time: 11/24/15 12:50 PM  Result Value Ref Range Status   Specimen Description BLOOD BLOOD RIGHT FOREARM  Final   Special Requests BOTTLES DRAWN AEROBIC AND ANAEROBIC 5CC  Final   Culture NO GROWTH 3 DAYS  Final   Report Status PENDING  Incomplete  Culture, Urine     Status: None   Collection Time: 11/24/15  2:00 PM  Result Value Ref Range Status   Specimen Description URINE, RANDOM  Final   Special Requests NONE  Final   Culture 4,000 COLONIES/mL INSIGNIFICANT GROWTH  Final   Report Status 11/25/2015 FINAL  Final  Culture, expectorated sputum-assessment     Status: None   Collection Time: 11/24/15  5:45 PM  Result Value Ref Range Status   Specimen Description SPUTUM  Final   Special Requests NONE  Final   Sputum evaluation   Final    MICROSCOPIC FINDINGS SUGGEST THAT THIS SPECIMEN IS NOT REPRESENTATIVE OF LOWER RESPIRATORY SECRETIONS. PLEASE RECOLLECT. Gram Stain Report Called to,Read Back By and Verified With: C WOODARD '@[id number]'$  11/24/15 MKELLY    Report Status 11/24/2015 FINAL  Final     Studies:   Recent x-ray studies have been reviewed in detail by the Attending Physician  Scheduled Meds:  Scheduled Meds: . antiseptic oral rinse  7 mL Mouth Rinse BID  . arformoterol  15 mcg Nebulization BID  . ARIPiprazole  5 mg Oral QHS  . aspirin EC  81 mg Oral Daily  . atorvastatin  20 mg Oral q1800  . [START ON 11/28/2015] azithromycin  500 mg Oral Daily  . cefTRIAXone (ROCEPHIN)  IV  1 g Intravenous Q24H  . clopidogrel  75 mg Oral Daily  .  dextromethorphan-guaiFENesin  1 tablet Oral BID  . finasteride  5 mg Oral Daily  . FLUoxetine  40 mg Oral q morning - 10a  . gabapentin  100 mg Oral Daily  . heparin  5,000 Units Subcutaneous 3 times per day  . insulin aspart  0-20 Units Subcutaneous 6 times per day  . ipratropium-albuterol  3 mL Nebulization Q6H  . isosorbide mononitrate  60 mg Oral Daily  . methylPREDNISolone (SOLU-MEDROL) injection  60 mg Intravenous Q12H  . metoprolol succinate  12.5 mg Oral Daily  . mirtazapine  45 mg Oral QHS  . modafinil  100 mg Oral Daily  .  mometasone-formoterol  2 puff Inhalation BID  . morphine  30 mg Oral Q12H  . pantoprazole  80 mg Oral Q1200  . potassium chloride SA  20 mEq Oral q morning - 10a  . sodium chloride  3 mL Intravenous Q12H  . tamsulosin  0.4 mg Oral Daily  . theophylline  400 mg Oral Daily    Time spent on care of this patient: 35 mins   Breezy Hertenstein T , MD   [facility] Office  [phone] Pager - Text Page per Shea Evans as per below:  On-Call/Text Page:      [email or web address]      password [email or web address]  If 7PM-7AM, please contact night-coverage [email or web address] Password [email or web address] 11/27/2015, 11:44 AM   LOS: 3 days

## 2015-11-27 NOTE — Progress Notes (Signed)
Pt for transfer to 6N. Report called to Dalene Carrow, RN

## 2015-11-28 DIAGNOSIS — F329 Major depressive disorder, single episode, unspecified: Secondary | ICD-10-CM

## 2015-11-28 DIAGNOSIS — K219 Gastro-esophageal reflux disease without esophagitis: Secondary | ICD-10-CM

## 2015-11-28 DIAGNOSIS — N400 Enlarged prostate without lower urinary tract symptoms: Secondary | ICD-10-CM

## 2015-11-28 LAB — GLUCOSE, CAPILLARY
GLUCOSE-CAPILLARY: 147 mg/dL — AB (ref 65–99)
GLUCOSE-CAPILLARY: 127 mg/dL — AB (ref 65–99)
GLUCOSE-CAPILLARY: 179 mg/dL — AB (ref 65–99)
Glucose-Capillary: 211 mg/dL — ABNORMAL HIGH (ref 65–99)

## 2015-11-28 LAB — CBC
HEMATOCRIT: 40 % (ref 39.0–52.0)
HEMOGLOBIN: 12.6 g/dL — AB (ref 13.0–17.0)
MCH: 29.7 pg (ref 26.0–34.0)
MCHC: 31.5 g/dL (ref 30.0–36.0)
MCV: 94.3 fL (ref 78.0–100.0)
Platelets: 98 10*3/uL — ABNORMAL LOW (ref 150–400)
RBC: 4.24 MIL/uL (ref 4.22–5.81)
RDW: 15.1 % (ref 11.5–15.5)
WBC: 9.1 10*3/uL (ref 4.0–10.5)

## 2015-11-28 LAB — BASIC METABOLIC PANEL
ANION GAP: 8 (ref 5–15)
BUN: 12 mg/dL (ref 6–20)
CALCIUM: 9.2 mg/dL (ref 8.9–10.3)
CO2: 36 mmol/L — AB (ref 22–32)
Chloride: 97 mmol/L — ABNORMAL LOW (ref 101–111)
Creatinine, Ser: 0.96 mg/dL (ref 0.61–1.24)
GFR calc non Af Amer: >60 mL/min (ref >60)
GLUCOSE: 177 mg/dL — AB (ref 65–99)
POTASSIUM: 4.5 mmol/L (ref 3.5–5.1)
Sodium: 141 mmol/L (ref 135–145)

## 2015-11-28 LAB — RESPIRATORY VIRUS PANEL
Adenovirus: NEGATIVE
INFLUENZA B 1: NEGATIVE
Influenza A: NEGATIVE
Metapneumovirus: NEGATIVE
PARAINFLUENZA 1 A: NEGATIVE
PARAINFLUENZA 2 A: NEGATIVE
Parainfluenza 3: NEGATIVE
RESPIRATORY SYNCYTIAL VIRUS B: NEGATIVE
Respiratory Syncytial Virus A: NEGATIVE
Rhinovirus: NEGATIVE

## 2015-11-28 NOTE — Consult Note (Signed)
   [facility] CM Inpatient Consult   11/28/2015  Ernest [name] 03-15-1956 [id number]   Patient has been followed by [place]. Spoke with patient at bedside to confirm if he was interested in continued [facility] follow up. He is agreeable and written consent obtained. Explained to [name] that he will be assigned to [place] post hospital discharge. He will receive post hospital transition of care calls and will be evaluated for monthly home visits. He is agreeable to this. Confirmed Primary Care MD is Dr. [name]. Confirmed best contact numbers are cell [phone] and home [phone]. Left [facility] Care Management packet and contact information at bedside. Appreciative of visit. Will make inpatient RNCM aware.  Marthenia Rolling, MSN-Ed, RN,BSN [facility] Liaison [phone]

## 2015-11-28 NOTE — Progress Notes (Signed)
PROGRESS NOTE  Ernest [name] QVZ:[id number] DOB: 10/13/1956 DOA: 11/24/2015 PCP: [name], MD  Admit HPI / Brief Narrative: 60 y.o. 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 Desaturated en route to ED per EMS. 2 neb treatments and 125 mg solumedrol given en route. He was essentially unresponsive on arrival. He was placed on BIPAP, and responded.  His initial ABG of 7.075/108/240/30 fio2 100% improved to 7.239/69/117/29.   HPI/Subjective: The patient is resting comfortable in bed.  He reports his breathing is improving; but still slightly SOB. No fever, no CP.  Assessment/Plan:  Acute on chronic respiratory failure with hypercapnia and hypoxia / Severe COPD w/ acute exacerbation - Sepsis due to Strep pneumonia -positive Strep pneumo urine antigen - oxygen requirement back to his baseline of 4L - cont abx tx  Narrowed to cover Strep alone  -will continue nebulizer treatment, IV steroids and use of flutter valve -sepsis features resolved  Squamous Cell Lung CA LUL -no acute issues at this time -continue outpatient treatment with oncology at discharge    DM2  -11/10 A1c 6.2  - CBG reasonably controlled at this time  - follow CBG's and adjust as needed   CAD -no CP -will continue ASA, statins, IMDUR and b-blocker  HTN -BP currently well controlled  -continue current regimen  Depression -will continue Prozac and remeron  Chronic pain -will continue home chronic pain regimen  -stable  GERD -continue PPI  BPH -no complaints of urinary retention -continue flomax  Code Status: FULL Family Communication: no family present at time of exam Disposition Plan: remains inpatient; still wheezing a lot and with just fair air movement; continue IV steroids and abx's; home when breathing stable. Hopefully 1-2 more  days   Consultants: none  Procedures: none  Antibiotics: Azithromycin 12/30 > 1/2 Ceftriaxone 12/30 >  DVT prophylaxis: SQ heparin   Objective: Blood pressure 151/73, pulse 72, temperature 97.6 F (36.4 C), temperature source Oral, resp. rate 18, height '5\' 6"'$  (1.676 m), weight 91.989 kg (202 lb 12.8 oz), SpO2 97 %.  Intake/Output Summary (Last 24 hours) at 11/28/15 1733 Last data filed at 11/28/15 1654  Gross per 24 hour  Intake   1882 ml  Output    550 ml  Net   1332 ml   Exam: General: No CP, no acute distress; still SOB and unable to speak in full sentences. No overnight complaints Lungs: diffuse wheezing and scattered rhonchi, no crackles.   Cardiovascular: Regular rate and rhythm without murmur gallop or rub normal S1 and S2 Abdomen: Nontender, nondistended, soft, bowel sounds positive, no rebound, no ascites, no appreciable mass Extremities: No significant cyanosis, clubbing, or edema bilateral lower extremities  Data Reviewed:  Basic Metabolic Panel:  Recent Labs Lab 11/24/15 0154 11/25/15 0710 11/26/15 0925 11/27/15 0251 11/28/15 0557  NA 138 140 139 142 141  K 4.5 4.8 4.4 5.7* 4.5  CL 100* 104 101 104 97*  CO2 '23 28 29 27 '$ 36*  GLUCOSE 271* 191* 209* 130* 177*  BUN '10 15 13 12 12  '$ CREATININE 1.23 0.95 0.88 0.78 0.96  CALCIUM 9.3 9.1 8.9 8.9 9.2  MG  --  2.1 2.2 2.1  --     CBC:  Recent Labs Lab 11/24/15 0154 11/25/15 0710 11/26/15 0925 11/27/15 0251 11/28/15 0557  WBC 26.0* 16.4* 13.7*  12.1* 9.1  NEUTROABS 15.3* 15.2* 12.9*  --   --   HGB 16.1 13.7 13.7 13.1 12.6*  HCT 51.0 43.0 43.8 42.5 40.0  MCV 95.7 93.7 94.0 94.4 94.3  PLT 221 117* 103* 111* 98*    Liver Function Tests:  Recent Labs Lab 11/24/15 0154 11/25/15 0710  AST 55* 33  ALT 36 37  ALKPHOS 158* 113  BILITOT 0.2* 0.4  PROT 6.2* 5.6*  ALBUMIN 3.8 3.0*   CBG:  Recent Labs Lab 11/27/15 1706 11/27/15 2113 11/28/15 0712 11/28/15 1133 11/28/15 1630  GLUCAP 185*  193* 147* 127* 211*    Recent Results (from the past 240 hour(s))  MRSA PCR Screening     Status: None   Collection Time: 11/24/15  7:03 AM  Result Value Ref Range Status   MRSA by PCR NEGATIVE NEGATIVE Final    Comment:        The GeneXpert MRSA Assay (FDA approved for NASAL specimens only), is one component of a comprehensive MRSA colonization surveillance program. It is not intended to diagnose MRSA infection nor to guide or monitor treatment for MRSA infections.   Respiratory virus panel     Status: None   Collection Time: 11/24/15  7:35 AM  Result Value Ref Range Status   Source - RVPAN NASOPHARYNGEAL  Corrected   Respiratory Syncytial Virus A Negative Negative Final   Respiratory Syncytial Virus B Negative Negative Final   Influenza A Negative Negative Final   Influenza B Negative Negative Final   Parainfluenza 1 Negative Negative Final   Parainfluenza 2 Negative Negative Final   Parainfluenza 3 Negative Negative Final   Metapneumovirus Negative Negative Final   Rhinovirus Negative Negative Final   Adenovirus Negative Negative Final    Comment: (NOTE) Performed At: [facility] [address] [place], [place] [id number] Lindon Romp MD [id number]   Culture, blood (routine x 2)     Status: None (Preliminary result)   Collection Time: 11/24/15 12:25 PM  Result Value Ref Range Status   Specimen Description BLOOD RIGHT ANTECUBITAL  Final   Special Requests BOTTLES DRAWN AEROBIC AND ANAEROBIC 5CC  Final   Culture NO GROWTH 4 DAYS  Final   Report Status PENDING  Incomplete  Culture, blood (routine x 2)     Status: None (Preliminary result)   Collection Time: 11/24/15 12:50 PM  Result Value Ref Range Status   Specimen Description BLOOD BLOOD RIGHT FOREARM  Final   Special Requests BOTTLES DRAWN AEROBIC AND ANAEROBIC 5CC  Final   Culture NO GROWTH 4 DAYS  Final   Report Status PENDING  Incomplete  Culture, Urine     Status: None   Collection Time:  11/24/15  2:00 PM  Result Value Ref Range Status   Specimen Description URINE, RANDOM  Final   Special Requests NONE  Final   Culture 4,000 COLONIES/mL INSIGNIFICANT GROWTH  Final   Report Status 11/25/2015 FINAL  Final  Culture, expectorated sputum-assessment     Status: None   Collection Time: 11/24/15  5:45 PM  Result Value Ref Range Status   Specimen Description SPUTUM  Final   Special Requests NONE  Final   Sputum evaluation   Final    MICROSCOPIC FINDINGS SUGGEST THAT THIS SPECIMEN IS NOT REPRESENTATIVE OF LOWER RESPIRATORY SECRETIONS. PLEASE RECOLLECT. Gram Stain Report Called to,Read Back By and Verified With: C WOODARD '@[id number]'$  11/24/15 MKELLY    Report Status 11/24/2015 FINAL  Final     Studies:   Recent  x-ray studies have been reviewed in detail by the Attending Physician  Scheduled Meds:  Scheduled Meds: . antiseptic oral rinse  7 mL Mouth Rinse BID  . arformoterol  15 mcg Nebulization BID  . ARIPiprazole  5 mg Oral QHS  . aspirin EC  81 mg Oral Daily  . atorvastatin  20 mg Oral q1800  . cefTRIAXone (ROCEPHIN)  IV  1 g Intravenous Q24H  . clopidogrel  75 mg Oral Daily  . dextromethorphan-guaiFENesin  1 tablet Oral BID  . enoxaparin (LOVENOX) injection  40 mg Subcutaneous Q24H  . finasteride  5 mg Oral Daily  . FLUoxetine  40 mg Oral q morning - 10a  . gabapentin  100 mg Oral Daily  . insulin aspart  0-20 Units Subcutaneous TID WC  . ipratropium-albuterol  3 mL Nebulization QID  . isosorbide mononitrate  60 mg Oral Daily  . methylPREDNISolone (SOLU-MEDROL) injection  40 mg Intravenous Q12H  . metoprolol succinate  12.5 mg Oral Daily  . mirtazapine  45 mg Oral QHS  . modafinil  100 mg Oral Daily  . mometasone-formoterol  2 puff Inhalation BID  . morphine  30 mg Oral Q12H  . pantoprazole  80 mg Oral Q1200  . sodium chloride  3 mL Intravenous Q12H  . tamsulosin  0.4 mg Oral Daily  . theophylline  400 mg Oral Daily    Time spent on care of this patient: 30  mins   Barton Dubois , MD  [facility] [phone]  If 7PM-7AM, please contact night-coverage [email or web address] Password [email or web address] 11/28/2015, 5:33 PM   LOS: 4 days

## 2015-11-28 NOTE — Evaluation (Signed)
Physical Therapy Evaluation/ Discharge Patient Details Name: Ernest [name] MRN: [id number] DOB: July 07, 1956 Today's Date: 11/28/2015   History of Present Illness  Pt admitted with respiratory failure and hypoxia. PMHx: depression, anxiety, COPD on home O2, lung CA, PVD, HTN, CAD  Clinical Impression  Pt moving very well, able to ambulate unit on 4L with sats maintained 98% throughout. Pt aware of energy conservation and pursed lip breathing and states he implements at home. Pt encouraged to continue ambulation and activity to maintain lung function with encouragement to monitor sats at home with his personal pulse ox. No further therapy needs at this time with pt aware and agreeable, will sign off.     Follow Up Recommendations No PT follow up    Equipment Recommendations  None recommended by PT    Recommendations for Other Services       Precautions / Restrictions Precautions Precautions: None      Mobility  Bed Mobility Overal bed mobility: Modified Independent                Transfers Overall transfer level: Modified independent                  Ambulation/Gait Ambulation/Gait assistance: Modified independent (Device/Increase time) Ambulation Distance (Feet): 500 Feet Assistive device: None Gait Pattern/deviations: Step-through pattern;Decreased stride length   Gait velocity interpretation: Below normal speed for age/gender General Gait Details: pt with good gait speed with 2 partial LOB with pt able to recover on his own, states that is normal for him and why he uses his cane at home. pt managing O2 tank throughout on his own.   Stairs            Wheelchair Mobility    Modified Rankin (Stroke Patients Only)       Balance                                             Pertinent Vitals/Pain Pain Assessment: No/denies pain    Home Living Family/patient expects to be discharged to:: Private residence Living  Arrangements: Spouse/significant other Available Help at Discharge: Family;Available PRN/intermittently Type of Home: House Home Access: Stairs to enter   Entrance Stairs-Number of Steps: 2 Home Layout: One level Home Equipment: Walker - 2 wheels;Cane - single point      Prior Function Level of Independence: Independent with assistive device(s)         Comments: uses cane and RW only if he's feeling really badly     Hand Dominance        Extremity/Trunk Assessment   Upper Extremity Assessment: Overall WFL for tasks assessed           Lower Extremity Assessment: Overall WFL for tasks assessed      Cervical / Trunk Assessment: Normal  Communication   Communication: No difficulties  Cognition Arousal/Alertness: Awake/alert Behavior During Therapy: WFL for tasks assessed/performed Overall Cognitive Status: Within Functional Limits for tasks assessed                      General Comments      Exercises        Assessment/Plan    PT Assessment Patent does not need any further PT services  PT Diagnosis Other (comment) (Asked to evaluate for pt with respiratory failure)   PT Problem List    PT Treatment  Interventions     PT Goals (Current goals can be found in the Care Plan section) Acute Rehab PT Goals Patient Stated Goal: return to fishing and hunting PT Goal Formulation: All assessment and education complete, DC therapy    Frequency     Barriers to discharge        Co-evaluation               End of Session Equipment Utilized During Treatment: Oxygen Activity Tolerance: Patient tolerated treatment well Patient left: in chair;with call bell/phone within reach;with nursing/sitter in room Nurse Communication: Mobility status         Time: 6237-6283 PT Time Calculation (min) (ACUTE ONLY): 12 min   Charges:   PT Evaluation $PT Eval Low Complexity: 1 Procedure     PT G CodesMelford Aase 11/28/2015, 10:25  AM Elwyn Reach, [place]

## 2015-11-29 LAB — CULTURE, BLOOD (ROUTINE X 2)
CULTURE: NO GROWTH
Culture: NO GROWTH

## 2015-11-29 LAB — GLUCOSE, CAPILLARY
Glucose-Capillary: 168 mg/dL — ABNORMAL HIGH (ref 65–99)
Glucose-Capillary: 128 mg/dL — ABNORMAL HIGH (ref 65–99)
Glucose-Capillary: 211 mg/dL — ABNORMAL HIGH (ref 65–99)
Glucose-Capillary: 169 mg/dL — ABNORMAL HIGH (ref 65–99)

## 2015-11-29 NOTE — Progress Notes (Signed)
PROGRESS NOTE  Ernest [name] ZSW:[id number] DOB: 1956-07-06 DOA: 11/24/2015 PCP: [name], MD  Admit HPI / Brief Narrative: 60 y.o. 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 Desaturated en route to ED per EMS. 2 neb treatments and 125 mg solumedrol given en route. He was essentially unresponsive on arrival. He was placed on BIPAP, and responded.  His initial ABG of 7.075/108/240/30 fio2 100% improved to 7.239/69/117/29.   HPI/Subjective: The patient is resting comfortable in bed.  He reports his breathing is improving; but still slightly SOB. No fever, no CP.  Assessment/Plan:  Acute on chronic respiratory failure with hypercapnia and hypoxia / Severe COPD w/ acute exacerbation - Sepsis due to Strep pneumonia -Ernest [name] having a history of chronic obstructive pulmonary disease and chronic hypoxemic respiratory failure who requires 3 L of supplemental oxygen at baseline. -Admitted for COPD exacerbation. -Showing slow clinical improvement, on 11/28/2014 reports feeling better although not quite at his baseline. -Plan for one more day of systemic steroids with Solu-Medrol, if shows improvement in a.m. will transition to oral steroids. -Has received 6 days of IV antibiotics, will stop antimicrobial therapy  Squamous Cell Lung CA LUL -no acute issues at this time -continue outpatient treatment with oncology at discharge    DM2  -11/10 A1c 6.2  - CBG reasonably controlled at this time  - follow CBG's and adjust as needed   CAD -no CP -will continue ASA, statins, IMDUR and b-blocker  HTN -BP currently well controlled  -continue current regimen  Depression -will continue Prozac and remeron  Chronic pain -will continue home chronic pain regimen  -stable  GERD -continue PPI  BPH -no complaints of urinary  retention -continue flomax  Code Status: FULL Family Communication: no family present at time of exam Disposition Plan: Anticipate discharge in the next 24 hours if he continues to show clinical improvement.  Consultants: none  Procedures: none  Antibiotics: Azithromycin 12/30 > 1/2 Ceftriaxone 12/30 >1/4  DVT prophylaxis: SQ heparin   Objective: Blood pressure 147/66, pulse 78, temperature 98.1 F (36.7 C), temperature source Oral, resp. rate 18, height '5\' 6"'$  (1.676 m), weight 91.989 kg (202 lb 12.8 oz), SpO2 98 %.  Intake/Output Summary (Last 24 hours) at 11/29/15 1557 Last data filed at 11/29/15 1310  Gross per 24 hour  Intake   1310 ml  Output    750 ml  Net    560 ml   Exam: General: He is awake and alert, sitting at bedside chair, states feeling a little better although not quite at his baseline Lungs: Continues to have wheezing and rhonchi on exam, currently on 3 L supplemental oxygen via nasal cannula Cardiovascular: Regular rate and rhythm without murmur gallop or rub normal S1 and S2 Abdomen: Nontender, nondistended, soft, bowel sounds positive, no rebound, no ascites, no appreciable mass Extremities: No significant cyanosis, clubbing, or edema bilateral lower extremities  Data Reviewed:  Basic Metabolic Panel:  Recent Labs Lab 11/24/15 0154 11/25/15 0710 11/26/15 0925 11/27/15 0251 11/28/15 0557  NA 138 140 139 142 141  K 4.5 4.8 4.4 5.7* 4.5  CL 100* 104 101 104 97*  CO2 '23 28 29 27 '$ 36*  GLUCOSE 271* 191* 209* 130* 177*  BUN '10 15 13 12 12  '$ CREATININE 1.23 0.95 0.88 0.78 0.96  CALCIUM 9.3 9.1 8.9 8.9  9.2  MG  --  2.1 2.2 2.1  --     CBC:  Recent Labs Lab 11/24/15 0154 11/25/15 0710 11/26/15 0925 11/27/15 0251 11/28/15 0557  WBC 26.0* 16.4* 13.7* 12.1* 9.1  NEUTROABS 15.3* 15.2* 12.9*  --   --   HGB 16.1 13.7 13.7 13.1 12.6*  HCT 51.0 43.0 43.8 42.5 40.0  MCV 95.7 93.7 94.0 94.4 94.3  PLT 221 117* 103* 111* 98*    Liver Function  Tests:  Recent Labs Lab 11/24/15 0154 11/25/15 0710  AST 55* 33  ALT 36 37  ALKPHOS 158* 113  BILITOT 0.2* 0.4  PROT 6.2* 5.6*  ALBUMIN 3.8 3.0*   CBG:  Recent Labs Lab 11/28/15 1133 11/28/15 1630 11/28/15 2118 11/29/15 0723 11/29/15 1214  GLUCAP 127* 211* 179* 168* 128*    Recent Results (from the past 240 hour(s))  MRSA PCR Screening     Status: None   Collection Time: 11/24/15  7:03 AM  Result Value Ref Range Status   MRSA by PCR NEGATIVE NEGATIVE Final    Comment:        The GeneXpert MRSA Assay (FDA approved for NASAL specimens only), is one component of a comprehensive MRSA colonization surveillance program. It is not intended to diagnose MRSA infection nor to guide or monitor treatment for MRSA infections.   Respiratory virus panel     Status: None   Collection Time: 11/24/15  7:35 AM  Result Value Ref Range Status   Source - RVPAN NASOPHARYNGEAL  Corrected   Respiratory Syncytial Virus A Negative Negative Final   Respiratory Syncytial Virus B Negative Negative Final   Influenza A Negative Negative Final   Influenza B Negative Negative Final   Parainfluenza 1 Negative Negative Final   Parainfluenza 2 Negative Negative Final   Parainfluenza 3 Negative Negative Final   Metapneumovirus Negative Negative Final   Rhinovirus Negative Negative Final   Adenovirus Negative Negative Final    Comment: (NOTE) Performed At: [facility] [address] [place], [place] [id number] Lindon Romp MD [id number]   Culture, blood (routine x 2)     Status: None   Collection Time: 11/24/15 12:25 PM  Result Value Ref Range Status   Specimen Description BLOOD RIGHT ANTECUBITAL  Final   Special Requests BOTTLES DRAWN AEROBIC AND ANAEROBIC 5CC  Final   Culture NO GROWTH 5 DAYS  Final   Report Status 11/29/2015 FINAL  Final  Culture, blood (routine x 2)     Status: None   Collection Time: 11/24/15 12:50 PM  Result Value Ref Range Status   Specimen  Description BLOOD BLOOD RIGHT FOREARM  Final   Special Requests BOTTLES DRAWN AEROBIC AND ANAEROBIC 5CC  Final   Culture NO GROWTH 5 DAYS  Final   Report Status 11/29/2015 FINAL  Final  Culture, Urine     Status: None   Collection Time: 11/24/15  2:00 PM  Result Value Ref Range Status   Specimen Description URINE, RANDOM  Final   Special Requests NONE  Final   Culture 4,000 COLONIES/mL INSIGNIFICANT GROWTH  Final   Report Status 11/25/2015 FINAL  Final  Culture, expectorated sputum-assessment     Status: None   Collection Time: 11/24/15  5:45 PM  Result Value Ref Range Status   Specimen Description SPUTUM  Final   Special Requests NONE  Final   Sputum evaluation   Final    MICROSCOPIC FINDINGS SUGGEST THAT THIS SPECIMEN IS NOT REPRESENTATIVE OF LOWER RESPIRATORY SECRETIONS. PLEASE  RECOLLECT. Gram Stain Report Called to,Read Back By and Verified With: C WOODARD '@[id number]'$  11/24/15 MKELLY    Report Status 11/24/2015 FINAL  Final     Studies:   Recent x-ray studies have been reviewed in detail by the Attending Physician  Scheduled Meds:  Scheduled Meds: . antiseptic oral rinse  7 mL Mouth Rinse BID  . arformoterol  15 mcg Nebulization BID  . ARIPiprazole  5 mg Oral QHS  . aspirin EC  81 mg Oral Daily  . atorvastatin  20 mg Oral q1800  . cefTRIAXone (ROCEPHIN)  IV  1 g Intravenous Q24H  . clopidogrel  75 mg Oral Daily  . dextromethorphan-guaiFENesin  1 tablet Oral BID  . enoxaparin (LOVENOX) injection  40 mg Subcutaneous Q24H  . finasteride  5 mg Oral Daily  . FLUoxetine  40 mg Oral q morning - 10a  . gabapentin  100 mg Oral Daily  . insulin aspart  0-20 Units Subcutaneous TID WC  . ipratropium-albuterol  3 mL Nebulization QID  . isosorbide mononitrate  60 mg Oral Daily  . methylPREDNISolone (SOLU-MEDROL) injection  40 mg Intravenous Q12H  . metoprolol succinate  12.5 mg Oral Daily  . mirtazapine  45 mg Oral QHS  . modafinil  100 mg Oral Daily  . mometasone-formoterol  2 puff  Inhalation BID  . morphine  30 mg Oral Q12H  . pantoprazole  80 mg Oral Q1200  . sodium chloride  3 mL Intravenous Q12H  . tamsulosin  0.4 mg Oral Daily  . theophylline  400 mg Oral Daily    Time spent on care of this patient: 25 mins   Kelvin Cellar , MD  [facility] [phone]  If 7PM-7AM, please contact night-coverage [email or web address] Password [email or web address] 11/29/2015, 3:57 PM   LOS: 5 days

## 2015-11-29 NOTE — Care Management Important Message (Signed)
Important Message  Patient Details  Name: [name] MRN: [id number] Date of Birth: [birth year]   Medicare Important Message Given:  Yes    Jaycelynn Knickerbocker P [place] 11/29/2015, 1:24 PM

## 2015-11-29 NOTE — Care Management Note (Signed)
Case Management Note  Patient Details  Name: Ernest [name] MRN: [id number] Date of Birth: 1956/09/23  Subjective/Objective:                    Action/Plan:  UR updated  Expected Discharge Date:                  Expected Discharge Plan:  Home/Self Care  In-House Referral:     Discharge planning Services     Post Acute Care Choice:    Choice offered to:     DME Arranged:    DME Agency:     HH Arranged:    [place] Agency:     Status of Service:  In process, will continue to follow  Medicare Important Message Given:    Date Medicare IM Given:    Medicare IM give by:    Date Additional Medicare IM Given:    Additional Medicare Important Message give by:     If discussed at [place] of Stay Meetings, dates discussed:    Additional Comments:  Marilu Favre, RN 11/29/2015, 11:36 AM

## 2015-11-30 LAB — GLUCOSE, CAPILLARY
Glucose-Capillary: 104 mg/dL — ABNORMAL HIGH (ref 65–99)
Glucose-Capillary: 164 mg/dL — ABNORMAL HIGH (ref 65–99)

## 2015-11-30 MED ORDER — PREDNISONE 10 MG (21) PO TBPK: ORAL_TABLET | ORAL | Status: DC

## 2015-11-30 NOTE — Discharge Summary (Signed)
Physician Discharge Summary  Ernest [name] HQP:[id number] DOB: 1956-09-09 DOA: 11/24/2015  PCP: [name], MD  Admit date: 11/24/2015 Discharge date: 11/30/2015  Time spent: 35 minutes  Recommendations for Outpatient Follow-up:  1. Please follow-up on respiratory status, patient was admitted and treated for COPD exacerbation and discharged on a prednisone taper.   Discharge Diagnoses:  Principal Problem:   Acute on chronic respiratory failure with hypoxia and hypercapnia (HCC) Active Problems:   Essential hypertension   COPD with asthma ([place])   Diabetes mellitus without complication (HCC)   COPD exacerbation (HCC)   Acute respiratory failure with hypercapnia (HCC)   Sepsis due to pneumonia [facility])   Discharge Condition: Stable/improved  Diet recommendation: Heart healthy  Filed Weights   11/24/15 0700  Weight: 91.989 kg (202 lb 12.8 oz)    History of present illness:  Ernest [name] is a 60 y.o. [name] with history of severe COPD, chronic CO2 retention, lung cancer treated with radiation and chemo and is currently "stable" in size. Patient presents to the ED in respiratory distress. Desaturated en route to ED per EMS. 2 neb treatments and 125 mg solumedrol given en route. He was essentially unresponsive on arrival. He was placed on BIPAP, and thankfully responded, his initial ABG of 7.075/108/240/30 fio2 100%, has improved to 7.239/69/117/29. The patient is now awake, says he is breathing "much better", and answering questions while on BIPAP.  Hospital Course:  Ernest [name] is a pleasant Ernest [name] with a past medical history of chronic obstructive pulmonary disease, chronic hypoxemic respiratory failure on 3 L supplemental oxygen via nasal cannula at baseline, admitted to the medicine service on 11/24/2015 when he presented with acute hypoxemic respiratory failure. Initially he required BiPAP with an ABG showing a pH of 7.07 and PCO2 of 108. He was  started on systemic steroids, empiric IV antibiotic therapy, nebulizer treatments. Chest x-ray on 11/25/2015 did not show acute infiltrate. He showed gradual clinical improvement, ABG obtained on 11/25/2015 revealed improved pH of 7.3 with PCO2 of 58.4. Acute on chronic hypoxemic hypercarbic respiratory failure likely precipitated by COPD exacerbation. This may have been caused by an underlying infectious process. Flu swab was negative. Respiratory viral panel negative. He continued to show improvement through this hospitalization. After receiving 6 days of IV antibiotic therapy ceftriaxone and azithromycin were discontinued. He was discharged to his home in stable condition on 11/30/2015 on a prednisone taper.   Discharge Exam: Filed Vitals:   11/29/15 2136 11/30/15 0549  BP: 141/67 153/67  Pulse: 69 69  Temp: 97.7 F (36.5 C) 98.1 F (36.7 C)  Resp: 18 19    General: Patient looks well, he is dressed and feels ready to go home today. Cardiovascular: Regular rate and rhythm normal S1-S2 no murmurs rubs or gallops Respiratory: Decreased breath sounds however no wheezing rhonchi rales, lung sounds overall clear Abdomen: Soft nontender nondistended Extremities: No edema  Discharge Instructions   Discharge Instructions    AMB Referral to [facility]    Complete by:  As directed   Please assign to [place] for COPD disease and symptom management. Currently active with [facility]. Written consent obtained. Please call with questions. Thanks. Marthenia Rolling, MSN-Ed, [facility] [phone]  Reason for consult:  Please assign to [place]. Already active with [facility]  Diagnoses of:  COPD/ Pneumonia  Expected date of contact:  1-3 days (reserved for hospital discharges)     Call MD for:  difficulty  breathing, headache or visual disturbances    Complete by:  As directed      Call MD for:  extreme fatigue    Complete by:  As directed       Call MD for:  hives    Complete by:  As directed      Call MD for:  persistant dizziness or light-headedness    Complete by:  As directed      Call MD for:  persistant nausea and vomiting    Complete by:  As directed      Call MD for:  redness, tenderness, or signs of infection (pain, swelling, redness, odor or green/yellow discharge around incision site)    Complete by:  As directed      Call MD for:  severe uncontrolled pain    Complete by:  As directed      Call MD for:  temperature >100.4    Complete by:  As directed      Call MD for:    Complete by:  As directed      Diet - low sodium heart healthy    Complete by:  As directed      Increase activity slowly    Complete by:  As directed           Current Discharge Medication List    START taking these medications   Details  predniSONE (STERAPRED UNI-PAK Ernest TAB) 10 MG (Ernest) TBPK tablet Take 6-5-4-3-2-1 tablets by mouth daily till gone. Qty: Ernest tablet, Refills: 0      CONTINUE these medications which have NOT CHANGED   Details  albuterol (PROVENTIL) (2.5 MG/3ML) 0.083% nebulizer solution INHALE 1 VIAL IN NEBULIZER EVERY 4 HOURS AS NEEDED FOR WHEEZING OR SHORTNESS OF BREATH Qty: 300 mL, Refills: 11    arformoterol (BROVANA) 15 MCG/2ML NEBU Take 2 mLs (15 mcg total) by nebulization 2 (two) times daily. Qty: 8 mL, Refills: 0    ARIPiprazole (ABILIFY) 5 MG tablet Take 1 tablet (5 mg total) by mouth at bedtime.    aspirin 81 MG EC tablet TAKE 1 TABLET EVERY DAY Qty: 30 tablet, Refills: 5    atorvastatin (LIPITOR) 20 MG tablet TAKE 1 TABLET EVERY DAY Qty: 90 tablet, Refills: 3    clonazePAM (KLONOPIN) 1 MG tablet Take 2 mg by mouth at bedtime as needed for anxiety.     clopidogrel (PLAVIX) 75 MG tablet Take 1 tablet (75 mg total) by mouth daily. Qty: 90 tablet, Refills: 3    esomeprazole (NEXIUM) 40 MG capsule Take 40 mg by mouth every morning.     finasteride (PROSCAR) 5 MG tablet Take 5 mg by mouth daily.      FLUoxetine (PROZAC) 40 MG capsule Take 40 mg by mouth every morning.    Fluticasone-Salmeterol (ADVAIR DISKUS) 500-50 MCG/DOSE AEPB 1 puff then rinse mouth, twice daily maintenance Qty: 60 each, Refills: 11    gabapentin (NEURONTIN) 100 MG capsule Take 100 mg by mouth daily.     isosorbide mononitrate (IMDUR) 60 MG 24 hr tablet Take 1 tablet (60 mg total) by mouth daily. Qty: 90 tablet, Refills: 3    methocarbamol (ROBAXIN) 500 MG tablet Take 500 mg by mouth 2 (two) times daily as needed for muscle spasms.     metoprolol succinate (TOPROL-XL) 25 MG 24 hr tablet TAKE 1/2 TABLET BY MOUTH DAILY Qty: 30 tablet, Refills: 3    mirtazapine (REMERON) 45 MG tablet Take 1 tablet (45 mg total) by mouth at  bedtime.    morphine (MS CONTIN) 30 MG 12 hr tablet Take 1 tablet (30 mg total) by mouth every 12 (twelve) hours. Qty: 60 tablet, Refills: 0   Associated Diagnoses: Chronic pain syndrome    morphine (MSIR) 15 MG tablet Take 1 tablet (15 mg total) by mouth every 4 (four) hours as needed for severe pain. Qty: 30 tablet, Refills: 0   Associated Diagnoses: Chronic pain syndrome    nitroGLYCERIN (NITROSTAT) 0.4 MG SL tablet Place 1 tablet (0.4 mg total) under the tongue every 5 (five) minutes as needed for chest pain. Qty: 25 tablet, Refills: 3    potassium chloride SA (K-DUR,KLOR-CON) 20 MEQ tablet Take 20 mEq by mouth every morning.     predniSONE (DELTASONE) 5 MG tablet Take 5 mg by mouth daily. Refills: 5    prochlorperazine (COMPAZINE) 10 MG tablet TAKE 1 TABLET BY MOUTH EVERY 6 HOURS AS NEEDED FOR NAUSEA AND VOMITING Qty: 60 tablet, Refills: 0    STIOLTO RESPIMAT 2.5-2.5 MCG/ACT AERS INHALE 2 PUFFS INTO THE LUNGS DAILY. Refills: 5   Associated Diagnoses: Squamous cell lung cancer, left (HCC)    tamsulosin (FLOMAX) 0.4 MG CAPS capsule Take 0.4 mg by mouth daily. Refills: 3    theophylline (UNIPHYL) 400 MG 24 hr tablet Take 400 mg by mouth daily. Refills: 5    VENTOLIN HFA 108  (90 BASE) MCG/ACT inhaler INHALE 2 PUFFS INTO THE LUNGS 4 TIMES DAILY AS NEEDED FOR WHEEZING Qty: 18 Inhaler, Refills: 11    VOLTAREN 1 % GEL Apply 2 g topically daily as needed (for pain).       STOP taking these medications     ALPRAZolam (XANAX) 0.25 MG tablet      Armodafinil (NUVIGIL) 150 MG tablet        Allergies  Allergen Reactions  . Carboplatin Shortness Of Breath, Swelling and Rash    Swelling of lips, rash on face,eyes and head   Follow-up Information    Follow up with [name], MD In 1 week.   Specialty:  Internal Medicine   Contact information:   [address] [address] [phone]       Follow up with Deneise Lever, MD In 2 weeks.   Specialty:  Pulmonary Disease   Contact information:   [place] [address] [phone]        The results of significant diagnostics from this hospitalization (including imaging, microbiology, ancillary and laboratory) are listed below for reference.    Significant Diagnostic Studies: Dg Chest Port 1 View  11/25/2015  CLINICAL DATA:  Pneumonia.  Shortness of breath. EXAM: PORTABLE CHEST 1 VIEW COMPARISON:  11/24/2015, 10/05/2015 and 07/14/2015 FINDINGS: There is chronic scarring at the left apex with the superior retraction of the left hilum. Slight haziness at both lung bases on the prior study has cleared. This may be an overlying soft tissues or minimal pulmonary edema. Heart size and vascularity are normal. No acute osseous abnormality. IMPRESSION: No acute abnormality.  Scarring at the left apex. Electronically Signed   By: Lorriane Shire M.D.   On: 11/25/2015 07:27   Dg Chest Port 1 View  11/24/2015  CLINICAL DATA:  60 year old [name] with shortness of breath EXAM: PORTABLE CHEST 1 VIEW COMPARISON:  Radiograph dated 10/05/2015 FINDINGS: Single-view of the chest demonstrate stable area of opacity at the left attacks, likely atelectasis/ scarring. Bibasilar atelectatic  changes noted. There is no other focal consolidation, pleural effusion, or pneumothorax.  Stable cardiac silhouette. Cervical fixation plate and screws noted. IMPRESSION: Left apical opacity, compatible with known scarring. No acute cardiopulmonary process. Electronically Signed   By: Anner Crete M.D.   On: 11/24/2015 02:02    Microbiology: Recent Results (from the past 240 hour(s))  MRSA PCR Screening     Status: None   Collection Time: 11/24/15  7:03 AM  Result Value Ref Range Status   MRSA by PCR NEGATIVE NEGATIVE Final    Comment:        The GeneXpert MRSA Assay (FDA approved for NASAL specimens only), is one component of a comprehensive MRSA colonization surveillance program. It is not intended to diagnose MRSA infection nor to guide or monitor treatment for MRSA infections.   Respiratory virus panel     Status: None   Collection Time: 11/24/15  7:35 AM  Result Value Ref Range Status   Source - RVPAN NASOPHARYNGEAL  Corrected   Respiratory Syncytial Virus A Negative Negative Final   Respiratory Syncytial Virus B Negative Negative Final   Influenza A Negative Negative Final   Influenza B Negative Negative Final   Parainfluenza 1 Negative Negative Final   Parainfluenza 2 Negative Negative Final   Parainfluenza 3 Negative Negative Final   Metapneumovirus Negative Negative Final   Rhinovirus Negative Negative Final   Adenovirus Negative Negative Final    Comment: (NOTE) Performed At: [facility] [address] [place], [place] [id number] Lindon Romp MD EZ:[id number]   Culture, blood (routine x 2)     Status: None   Collection Time: 11/24/15 12:25 PM  Result Value Ref Range Status   Specimen Description BLOOD RIGHT ANTECUBITAL  Final   Special Requests BOTTLES DRAWN AEROBIC AND ANAEROBIC 5CC  Final   Culture NO GROWTH 5 DAYS  Final   Report Status 11/29/2015 FINAL  Final  Culture, blood (routine x 2)     Status: None   Collection Time: 11/24/15 12:50  PM  Result Value Ref Range Status   Specimen Description BLOOD BLOOD RIGHT FOREARM  Final   Special Requests BOTTLES DRAWN AEROBIC AND ANAEROBIC 5CC  Final   Culture NO GROWTH 5 DAYS  Final   Report Status 11/29/2015 FINAL  Final  Culture, Urine     Status: None   Collection Time: 11/24/15  2:00 PM  Result Value Ref Range Status   Specimen Description URINE, RANDOM  Final   Special Requests NONE  Final   Culture 4,000 COLONIES/mL INSIGNIFICANT GROWTH  Final   Report Status 11/25/2015 FINAL  Final  Culture, expectorated sputum-assessment     Status: None   Collection Time: 11/24/15  5:45 PM  Result Value Ref Range Status   Specimen Description SPUTUM  Final   Special Requests NONE  Final   Sputum evaluation   Final    MICROSCOPIC FINDINGS SUGGEST THAT THIS SPECIMEN IS NOT REPRESENTATIVE OF LOWER RESPIRATORY SECRETIONS. PLEASE RECOLLECT. Gram Stain Report Called to,Read Back By and Verified With: C WOODARD '@[id number]'$  11/24/15 MKELLY    Report Status 11/24/2015 FINAL  Final     Labs: Basic Metabolic Panel:  Recent Labs Lab 11/24/15 0154 11/25/15 0710 11/26/15 0925 11/27/15 0251 11/28/15 0557  NA 138 140 139 142 141  K 4.5 4.8 4.4 5.7* 4.5  CL 100* 104 101 104 97*  CO2 '23 28 29 27 '$ 36*  GLUCOSE 271* 191* 209* 130* 177*  BUN '10 15 13 12 12  '$ CREATININE 1.23 0.95 0.88 0.78 0.96  CALCIUM 9.3 9.1 8.9 8.9 9.2  MG  --  2.1 2.2 2.1  --    Liver Function Tests:  Recent Labs Lab 11/24/15 0154 11/25/15 0710  AST 55* 33  ALT 36 37  ALKPHOS 158* 113  BILITOT 0.2* 0.4  PROT 6.2* 5.6*  ALBUMIN 3.8 3.0*   No results for input(s): LIPASE, AMYLASE in the last 168 hours. No results for input(s): AMMONIA in the last 168 hours. CBC:  Recent Labs Lab 11/24/15 0154 11/25/15 0710 11/26/15 0925 11/27/15 0251 11/28/15 0557  WBC 26.0* 16.4* 13.7* 12.1* 9.1  NEUTROABS 15.3* 15.2* 12.9*  --   --   HGB 16.1 13.7 13.7 13.1 12.6*  HCT 51.0 43.0 43.8 42.5 40.0  MCV 95.7 93.7 94.0 94.4  94.3  PLT 221 117* 103* 111* 98*   Cardiac Enzymes: No results for input(s): CKTOTAL, CKMB, CKMBINDEX, TROPONINI in the last 168 hours. BNP: BNP (last 3 results)  Recent Labs  04/11/15 0235 11/24/15 0154  BNP 305.2* 211.9*    ProBNP (last 3 results) No results for input(s): PROBNP in the last 8760 hours.  CBG:  Recent Labs Lab 11/29/15 1214 11/29/15 1740 11/29/15 2140 11/30/15 0736 11/30/15 1154  GLUCAP 128* 211* 169* 104* 164*       Signed:  Kelvin Cellar MD  FACP  [facility] 11/30/2015, 1:24 PM

## 2015-11-30 NOTE — Progress Notes (Signed)
Patient discharged to home. Discharge instructions were reviewed with patient and a copy of the AVS was given to patient as well. Patient agreed and verbalized understanding. No further questions at this moment.  Ernest [name] n 11/30/2015 1:17 PM

## 2015-12-01 ENCOUNTER — Other Ambulatory Visit: Payer: Self-pay | Admitting: *Deleted

## 2015-12-01 ENCOUNTER — Encounter: Payer: Self-pay | Admitting: *Deleted

## 2015-12-01 NOTE — Patient Outreach (Signed)
Called pt back  to let him know f/u appointment with Dr. Ronnald Ramp scheduled for 1/17.   Pt reports he is waiting for a call back from Dr. Janee Morn office about  f/u appointment.    Plan to f/u with pt again 1/12- home visit.   Zara Chess.   [facility]  [phone]

## 2015-12-01 NOTE — Patient Outreach (Signed)
Transition of care call (week 1- discharged 11/30/15).   Spoke with pt, HIPPA verified.   Pt reports it has been rough since hospital discharge, hanging in there.  Pt reports having a little sob, better since discharge home.   Pt reports has not made MD f/u appointments yet and  as agreed RN CM  to call Primary Care MD office to schedule .  Discussed with pt [facility] transition of care program as he was active with [place] coach, include weekly phone calls as well as a home visit.  Home visit scheduled for 1/12.    Plan to call Dr. Ronnald Ramp'  office, schedule pt's f/u appointment Plan to send update letter to Dr. Ronnald Ramp by in basket in [place] following pt for transition of care.   Zara Chess.   [facility]  [phone]

## 2015-12-04 DIAGNOSIS — J449 Chronic obstructive pulmonary disease, unspecified: Secondary | ICD-10-CM | POA: Diagnosis not present

## 2015-12-05 ENCOUNTER — Other Ambulatory Visit: Payer: Self-pay | Admitting: Cardiology

## 2015-12-06 ENCOUNTER — Telehealth: Payer: Self-pay | Admitting: Internal Medicine

## 2015-12-06 NOTE — Telephone Encounter (Signed)
Spoke with pt. He has been added to the schedule on 12/14/15 at 11:15am. Nothing further was needed.

## 2015-12-06 NOTE — Telephone Encounter (Signed)
Katie- please see what space you can find for him as requested

## 2015-12-06 NOTE — Telephone Encounter (Signed)
Pt was D/C'd home from hospital 12/01/15 with instructions to schedule a HFU with CY within 2 weeks.  Please advise the best place to schedule this patient. Thanks.

## 2015-12-06 NOTE — Telephone Encounter (Signed)
REFILL 

## 2015-12-06 NOTE — Telephone Encounter (Signed)
Pt can be seen 12-14-15 at 11:15am slot. Thanks.

## 2015-12-07 ENCOUNTER — Other Ambulatory Visit: Payer: Self-pay | Admitting: *Deleted

## 2015-12-08 NOTE — Patient Outreach (Signed)
[facility]) Care Management   Initial home visit 12/07/15  Ernest [name] 10-Apr-1956 [id number]  Ernest [name] is an 60 y.o. male  Subjective:  Pt reports feeling better since hospital discharge, sob increased, doing nebulizer treatments four times a day (only small amounts, not completing treatment), doing rescue inhaler three times a day.   Pt reports he has been staying inside, avoiding the  cold weather.   Pt reports diagnosed with lung cancer < a year ago, been through chemo/radiation, f/u with MD every three months, next visit 2/17.    Objective:   Filed Vitals:   12/07/15 1554  BP: 110/78  Pulse: 78  Resp: 20    ROS  Physical Exam  Constitutional: He is oriented to person, place, and time. He appears well-developed and well-nourished.  Cardiovascular: Normal rate and regular rhythm.   Respiratory: He has wheezes.  Wheezing noted on expiration in all fields.   GI: Soft.  Musculoskeletal: Normal range of motion.  Neurological: He is alert and oriented to person, place, and time. He has normal reflexes.  Skin: Skin is warm and dry.  Psychiatric: He has a normal mood and affect. His behavior is normal. Judgment and thought content normal.    Current Medications:  Reviewed with pt  Current Outpatient Prescriptions  Medication Sig Dispense Refill  . albuterol (PROVENTIL) (2.5 MG/3ML) 0.083% nebulizer solution INHALE 1 VIAL IN NEBULIZER EVERY 4 HOURS AS NEEDED FOR WHEEZING OR SHORTNESS OF BREATH 300 mL 11  . arformoterol (BROVANA) 15 MCG/2ML NEBU Take 2 mLs (15 mcg total) by nebulization 2 (two) times daily. 8 mL 0  . ARIPiprazole (ABILIFY) 5 MG tablet Take 1 tablet (5 mg total) by mouth at bedtime.    [name] aspirin 81 MG EC tablet TAKE 1 TABLET EVERY DAY 30 tablet 5  . atorvastatin (LIPITOR) 20 MG tablet TAKE 1 TABLET EVERY DAY 90 tablet 3  . clonazePAM (KLONOPIN) 1 MG tablet Take 2 mg by mouth at bedtime as needed for anxiety.     . clopidogrel  (PLAVIX) 75 MG tablet Take 1 tablet (75 mg total) by mouth daily. 90 tablet 3  . esomeprazole (NEXIUM) 40 MG capsule Take 40 mg by mouth every morning.     . finasteride (PROSCAR) 5 MG tablet Take 5 mg by mouth daily.     [name] FLUoxetine (PROZAC) 40 MG capsule Take 40 mg by mouth every morning.    . Fluticasone-Salmeterol (ADVAIR DISKUS) 500-50 MCG/DOSE AEPB 1 puff then rinse mouth, twice daily maintenance 60 each 11  . gabapentin (NEURONTIN) 100 MG capsule Take 100 mg by mouth daily.     . isosorbide mononitrate (IMDUR) 60 MG 24 hr tablet TAKE 1 TABLET (60 MG TOTAL) BY MOUTH DAILY. 90 tablet 3  . methocarbamol (ROBAXIN) 500 MG tablet Take 500 mg by mouth 2 (two) times daily as needed for muscle spasms.     . metoprolol succinate (TOPROL-XL) 25 MG 24 hr tablet TAKE 1/2 TABLET BY MOUTH DAILY 30 tablet 3  . mirtazapine (REMERON) 45 MG tablet Take 1 tablet (45 mg total) by mouth at bedtime.    [name] morphine (MS CONTIN) 30 MG 12 hr tablet Take 1 tablet (30 mg total) by mouth every 12 (twelve) hours. 60 tablet 0  . morphine (MSIR) 15 MG tablet Take 1 tablet (15 mg total) by mouth every 4 (four) hours as needed for severe pain. 30 tablet 0  . nitroGLYCERIN (NITROSTAT) 0.4 MG SL tablet Place 1  tablet (0.4 mg total) under the tongue every 5 (five) minutes as needed for chest pain. 25 tablet 3  . predniSONE (DELTASONE) 5 MG tablet Take 5 mg by mouth daily.  5  . predniSONE (STERAPRED UNI-PAK 21 TAB) 10 MG (21) TBPK tablet Take 6-5-4-3-2-1 tablets by mouth daily till gone. 21 tablet 0  . prochlorperazine (COMPAZINE) 10 MG tablet TAKE 1 TABLET BY MOUTH EVERY 6 HOURS AS NEEDED FOR NAUSEA AND VOMITING 60 tablet 0  . STIOLTO RESPIMAT 2.5-2.5 MCG/ACT AERS INHALE 2 PUFFS INTO THE LUNGS DAILY.  5  . tamsulosin (FLOMAX) 0.4 MG CAPS capsule Take 0.4 mg by mouth daily.  3  . theophylline (UNIPHYL) 400 MG 24 hr tablet Take 400 mg by mouth daily.  5  . VENTOLIN HFA 108 (90 BASE) MCG/ACT inhaler INHALE 2 PUFFS INTO THE LUNGS  4 TIMES DAILY AS NEEDED FOR WHEEZING 18 Inhaler 11  . VOLTAREN 1 % GEL Apply 2 g topically daily as needed (for pain).     . potassium chloride SA (K-DUR,KLOR-CON) 20 MEQ tablet Take 20 mEq by mouth every morning. Reported on 12/07/2015     No current facility-administered medications for this visit.   Facility-Administered Medications Ordered in Other Visits  Medication Dose Route Frequency Provider Last Rate Last Dose  . DOBUTamine (DOBUTREX) 1,000 mcg/mL in dextrose 5% 250 mL infusion  0-40 mcg/kg/min Intravenous Continuous Fay Records, MD 224.2 mL/hr at 06/12/15 1117 40 mcg/kg/min at 06/12/15 1117    Functional Status:   In your present state of health, do you have any difficulty performing the following activities: 12/07/2015 11/24/2015  Hearing? - Y  Vision? - N  Difficulty concentrating or making decisions? - N  Walking or climbing stairs? - Y  Dressing or bathing? - N  Doing errands, shopping? - N  Conservation officer, nature and eating ? N -  Using the Toilet? N -  In the past six months, have you accidently leaked urine? N -  Do you have problems with loss of bowel control? N -  Managing your Medications? N -  Managing your Finances? N -  Housekeeping or managing your Housekeeping? N -    Fall/Depression Screening:    PHQ 2/9 Scores 12/07/2015 10/23/2015 10/16/2015  PHQ - 2 Score 1 1 1     Assessment:  COPD-  Wheezing noted in all fields.  Discussed with pt need to finish nebulizer treatment to have full effect of medication.   O2 saturation at rest on 3 LNC 98%, ambulating without O2  Approximately 20 feet- 97%.       Plan:  As discussed, pt to finish entire nebulizer treatments.             Pt to f/u with Dr. Ronnald Ramp 1/17- post hospitalization visit            Pt to f/u with Dr. Annamaria Boots 1/19- post hospitalization visit             RN CM to f/u again telephonically on 1/19 as part of ongoing transition of care.    [facility] CM Care Plan Problem One        Most Recent Value   Care Plan  Problem One  Risk for readmission related to recent hospitalization respiratory failure- hx of COPD   Role Documenting the Problem One  Care Management Coordinator   Care Plan for Problem One  Active   [facility]N Long Term Goal (31-90 days)  Pt would not readmit 31 days from day  of discharge    [facility] Term Goal Start Date  12/01/15   Interventions for Problem One Long Term Goal  RN CM to f/u with weekly phone calls/home visit scheduled as part of transition of care    [facility]N CM Short Term Goal #1 (0-30 days)  Pt would see improvement in endurance in the next 10 days    [facility] CM Short Term Goal #1 Start Date  12/01/15   [facility] CM Short Term Goal #1 Met Date  12/07/15 [pt reports feeling better ]   Interventions for Short Term Goal #1  Discussed with pt importance of pacing activities, give himself time to heal, not to overdue activities    [facility]N CM Short Term Goal #2 (0-30 days)  Pt would f/u with Primary Care MD in the next 2 weeks    [facility]N CM Short Term Goal #2 Start Date  12/01/15   Interventions for Short Term Goal #2  RN CM called MD office, f/u appointment scheduled for 1/17.     [facility]N CM Care Plan Problem Two        Most Recent Value   Care Plan Problem Two  COPD - better self management    Role Documenting the Problem Two  Care Management Coordinator   Care Plan for Problem Two  Active   [facility] CM Short Term Goal #1 (0-30 days)  Pt would be more familiar with 3 zones of COPD    [facility]N CM Short Term Goal #1 Start Date  12/07/15   Interventions for Short Term Goal #2   Reviewed with pt the 3 zones of COPD, when to call MD.  Also demonstrated with pt use of purse lip breathing, purchasing a pulse ox to check O2.    [facility]N CM Short Term Goal #2 (0-30 days)  Pt would complete nebulizer treatments for the next  14 days    [facility]N CM Short Term Goal #2 Start Date  12/07/15   Interventions for Short Term Goal #2  Discussed with pt need to complete nebulizer treatments, not just taking a small amount to have effectively of  medications       Ernest Crance M.   [facility]  [phone]

## 2015-12-11 ENCOUNTER — Other Ambulatory Visit: Payer: Self-pay | Admitting: *Deleted

## 2015-12-12 ENCOUNTER — Ambulatory Visit (INDEPENDENT_AMBULATORY_CARE_PROVIDER_SITE_OTHER): Payer: Commercial Managed Care - HMO | Admitting: Internal Medicine

## 2015-12-12 ENCOUNTER — Encounter: Payer: Self-pay | Admitting: Internal Medicine

## 2015-12-12 VITALS — BP 126/70 | HR 79 | Temp 99.3°F | Resp 20 | Ht 66.0 in | Wt 204.0 lb

## 2015-12-12 DIAGNOSIS — B001 Herpesviral vesicular dermatitis: Secondary | ICD-10-CM | POA: Diagnosis not present

## 2015-12-12 DIAGNOSIS — J189 Pneumonia, unspecified organism: Secondary | ICD-10-CM

## 2015-12-12 DIAGNOSIS — A419 Sepsis, unspecified organism: Secondary | ICD-10-CM

## 2015-12-12 DIAGNOSIS — I10 Essential (primary) hypertension: Secondary | ICD-10-CM | POA: Diagnosis not present

## 2015-12-12 DIAGNOSIS — K120 Recurrent oral aphthae: Secondary | ICD-10-CM | POA: Diagnosis not present

## 2015-12-12 MED ORDER — AMOXICILLIN-POT CLAVULANATE 875-125 MG PO TABS: 1.0000 | ORAL_TABLET | Freq: Two times a day (BID) | ORAL | Status: AC

## 2015-12-12 MED ORDER — FIRST-DUKES MOUTHWASH MT SUSP
5.0000 mL | Freq: Four times a day (QID) | OROMUCOSAL | Status: DC | PRN
Start: 2015-12-12 — End: 2016-01-12

## 2015-12-12 MED ORDER — POTASSIUM CHLORIDE CRYS ER 20 MEQ PO TBCR: 20.0000 meq | EXTENDED_RELEASE_TABLET | Freq: Every morning | ORAL | Status: DC

## 2015-12-12 MED ORDER — VALACYCLOVIR HCL 1 G PO TABS: 1000.0000 mg | ORAL_TABLET | Freq: Two times a day (BID) | ORAL | Status: DC

## 2015-12-12 NOTE — Progress Notes (Signed)
Pre visit review using our clinic review tool, if applicable. No additional management support is needed unless otherwise documented below in the visit note. 

## 2015-12-12 NOTE — Patient Instructions (Signed)

## 2015-12-12 NOTE — Progress Notes (Signed)
Subjective:  Patient ID: Ernest [name], male    DOB: 02/27/1956  Age: 60 y.o. MRN: [id number]  CC: Cough and Mouth Lesions   HPI Ernest [name] presents for follow-up after recent admission for COPD exacerbation with pneumonia-sepsis. He was doing well for a week or 2 and now complains of recurrent cough that is productive of thick, yellow-green phlegm. He also complains of a cold sore on his right upper lip and sores in his mouth.  1. Please follow-up on respiratory status, patient was admitted and treated for COPD exacerbation and discharged on a prednisone taper.   Discharge Diagnoses:  Principal Problem:  Acute on chronic respiratory failure with hypoxia and hypercapnia (HCC) Active Problems:  Essential hypertension  COPD with asthma ([place])  Diabetes mellitus without complication (HCC)  COPD exacerbation ([place])  Acute respiratory failure with hypercapnia ([place])  Sepsis due to pneumonia [facility])   Outpatient Prescriptions Prior to Visit  Medication Sig Dispense Refill  . albuterol (PROVENTIL) (2.5 MG/3ML) 0.083% nebulizer solution INHALE 1 VIAL IN NEBULIZER EVERY 4 HOURS AS NEEDED FOR WHEEZING OR SHORTNESS OF BREATH 300 mL 11  . arformoterol (BROVANA) 15 MCG/2ML NEBU Take 2 mLs (15 mcg total) by nebulization 2 (two) times daily. 8 mL 0  . ARIPiprazole (ABILIFY) 5 MG tablet Take 1 tablet (5 mg total) by mouth at bedtime.    [name] aspirin 81 MG EC tablet TAKE 1 TABLET EVERY DAY 30 tablet 5  . atorvastatin (LIPITOR) 20 MG tablet TAKE 1 TABLET EVERY DAY 90 tablet 3  . clonazePAM (KLONOPIN) 1 MG tablet Take 2 mg by mouth at bedtime as needed for anxiety.     . clopidogrel (PLAVIX) 75 MG tablet Take 1 tablet (75 mg total) by mouth daily. 90 tablet 3  . esomeprazole (NEXIUM) 40 MG capsule Take 40 mg by mouth every morning.     . finasteride (PROSCAR) 5 MG tablet Take 5 mg by mouth daily.     [name] FLUoxetine (PROZAC) 40 MG capsule Take 40 mg by mouth every morning.    .  Fluticasone-Salmeterol (ADVAIR DISKUS) 500-50 MCG/DOSE AEPB 1 puff then rinse mouth, twice daily maintenance 60 each 11  . gabapentin (NEURONTIN) 100 MG capsule Take 100 mg by mouth daily.     . isosorbide mononitrate (IMDUR) 60 MG 24 hr tablet TAKE 1 TABLET (60 MG TOTAL) BY MOUTH DAILY. 90 tablet 3  . methocarbamol (ROBAXIN) 500 MG tablet Take 500 mg by mouth 2 (two) times daily as needed for muscle spasms.     . metoprolol succinate (TOPROL-XL) 25 MG 24 hr tablet TAKE 1/2 TABLET BY MOUTH DAILY 30 tablet 3  . mirtazapine (REMERON) 45 MG tablet Take 1 tablet (45 mg total) by mouth at bedtime.    [name] morphine (MS CONTIN) 30 MG 12 hr tablet Take 1 tablet (30 mg total) by mouth every 12 (twelve) hours. 60 tablet 0  . morphine (MSIR) 15 MG tablet Take 1 tablet (15 mg total) by mouth every 4 (four) hours as needed for severe pain. 30 tablet 0  . nitroGLYCERIN (NITROSTAT) 0.4 MG SL tablet Place 1 tablet (0.4 mg total) under the tongue every 5 (five) minutes as needed for chest pain. 25 tablet 3  . STIOLTO RESPIMAT 2.5-2.5 MCG/ACT AERS INHALE 2 PUFFS INTO THE LUNGS DAILY.  5  . tamsulosin (FLOMAX) 0.4 MG CAPS capsule Take 0.4 mg by mouth daily.  3  . theophylline (UNIPHYL) 400 MG 24 hr tablet Take 400 mg by  mouth daily.  5  . VENTOLIN HFA 108 (90 BASE) MCG/ACT inhaler INHALE 2 PUFFS INTO THE LUNGS 4 TIMES DAILY AS NEEDED FOR WHEEZING 18 Inhaler 11  . VOLTAREN 1 % GEL Apply 2 g topically daily as needed (for pain).     . potassium chloride SA (K-DUR,KLOR-CON) 20 MEQ tablet Take 20 mEq by mouth every morning. Reported on 12/07/2015    . predniSONE (DELTASONE) 5 MG tablet Take 5 mg by mouth daily.  5  . prochlorperazine (COMPAZINE) 10 MG tablet TAKE 1 TABLET BY MOUTH EVERY 6 HOURS AS NEEDED FOR NAUSEA AND VOMITING 60 tablet 0  . predniSONE (STERAPRED UNI-PAK 21 TAB) 10 MG (21) TBPK tablet Take 6-5-4-3-2-1 tablets by mouth daily till gone. 21 tablet 0   Facility-Administered Medications Prior to Visit    Medication Dose Route Frequency Provider Last Rate Last Dose  . DOBUTamine (DOBUTREX) 1,000 mcg/mL in dextrose 5% 250 mL infusion  0-40 mcg/kg/min Intravenous Continuous Fay Records, MD 224.2 mL/hr at 06/12/15 1117 40 mcg/kg/min at 06/12/15 1117    ROS Review of Systems  Constitutional: Positive for fatigue.  HENT: Positive for mouth sores. Negative for facial swelling, postnasal drip, sinus pressure, sore throat, tinnitus and trouble swallowing.   Eyes: Negative.   Respiratory: Positive for cough, shortness of breath and wheezing. Negative for choking, chest tightness and stridor.   Cardiovascular: Negative.  Negative for chest pain, palpitations and leg swelling.  Gastrointestinal: Negative.  Negative for nausea, vomiting, abdominal pain, diarrhea, constipation and blood in stool.  Endocrine: Negative.   Genitourinary: Negative.  Negative for difficulty urinating.  Musculoskeletal: Negative.  Negative for myalgias, back pain, joint swelling and arthralgias.  Skin: Negative.  Negative for color change and rash.  Allergic/Immunologic: Negative.   Neurological: Negative.  Negative for dizziness, seizures, weakness, light-headedness and numbness.  Hematological: Negative.  Negative for adenopathy. Does not bruise/bleed easily.  Psychiatric/Behavioral: Negative.     Objective:  BP 126/70 mmHg  Pulse 79  Temp(Src) 99.3 F (37.4 C) (Oral)  Resp 20  Ht '5\' 6"'$  (1.676 m)  Wt 204 lb (92.534 kg)  BMI 32.94 kg/m2  SpO2 92%  BP Readings from Last 3 Encounters:  12/14/15 118/74  12/12/15 126/70  12/07/15 110/78    Wt Readings from Last 3 Encounters:  12/14/15 205 lb 9.6 oz (93.26 kg)  12/12/15 204 lb (92.534 kg)  12/07/15 205 lb (92.987 kg)    Physical Exam  Constitutional: He is oriented to person, place, and time.  Non-toxic appearance. He has a sickly appearance. He appears ill. No distress.  HENT:  Mouth/Throat: Mucous membranes are normal. Mucous membranes are not pale, not  dry and not cyanotic. Oral lesions present. No trismus in the jaw. No dental abscesses or uvula swelling. Posterior oropharyngeal erythema present. No oropharyngeal exudate, posterior oropharyngeal edema or tonsillar abscesses.    In the oropharynx there is diffuse erythema and a few aphthous ulcers, there is no exudate.  Eyes: Conjunctivae are normal. Right eye exhibits no discharge. Left eye exhibits no discharge. No scleral icterus.  Neck: Normal range of motion. Neck supple. No JVD present. No tracheal deviation present. No thyromegaly present.  Cardiovascular: Normal rate, regular rhythm, normal heart sounds and intact distal pulses.  Exam reveals no gallop and no friction rub.   No murmur heard. Pulmonary/Chest: Effort normal. No accessory muscle usage or stridor. No tachypnea. No respiratory distress. He has no decreased breath sounds. He has wheezes in the right lower field and the  left lower field. He has rhonchi in the right lower field and the left lower field. He has no rales. He exhibits no tenderness.  Abdominal: Soft. Bowel sounds are normal. He exhibits no distension and no mass. There is no tenderness. There is no rebound and no guarding.  Musculoskeletal: Normal range of motion. He exhibits no edema or tenderness.  Lymphadenopathy:    He has no cervical adenopathy.  Neurological: He is oriented to person, place, and time.  Skin: Skin is warm and dry. No rash noted. He is not diaphoretic. No erythema. No pallor.  Vitals reviewed.   Lab Results  Component Value Date   WBC 9.1 11/28/2015   HGB 12.6* 11/28/2015   HCT 40.0 11/28/2015   PLT 98* 11/28/2015   GLUCOSE 177* 11/28/2015   CHOL 164 10/05/2015   TRIG 174.0* 10/05/2015   HDL 48.70 10/05/2015   LDLDIRECT 71.4 09/08/2012   LDLCALC 80 10/05/2015   ALT 37 11/25/2015   AST 33 11/25/2015   NA 141 11/28/2015   K 4.5 11/28/2015   CL 97* 11/28/2015   CREATININE 0.96 11/28/2015   BUN 12 11/28/2015   CO2 36* 11/28/2015     TSH 2.353 08/07/2015   PSA 0.64 07/18/2011   INR 0.91 08/07/2015   HGBA1C 6.2 10/05/2015   MICROALBUR 5.7* 10/05/2015    Dg Chest Port 1 View  11/25/2015  CLINICAL DATA:  Pneumonia.  Shortness of breath. EXAM: PORTABLE CHEST 1 VIEW COMPARISON:  11/24/2015, 10/05/2015 and 07/14/2015 FINDINGS: There is chronic scarring at the left apex with the superior retraction of the left hilum. Slight haziness at both lung bases on the prior study has cleared. This may be an overlying soft tissues or minimal pulmonary edema. Heart size and vascularity are normal. No acute osseous abnormality. IMPRESSION: No acute abnormality.  Scarring at the left apex. Electronically Signed   By: Lorriane Shire M.D.   On: 11/25/2015 07:27   Dg Chest Port 1 View  11/24/2015  CLINICAL DATA:  60 year old male with shortness of breath EXAM: PORTABLE CHEST 1 VIEW COMPARISON:  Radiograph dated 10/05/2015 FINDINGS: Single-view of the chest demonstrate stable area of opacity at the left attacks, likely atelectasis/ scarring. Bibasilar atelectatic changes noted. There is no other focal consolidation, pleural effusion, or pneumothorax. Stable cardiac silhouette. Cervical fixation plate and screws noted. IMPRESSION: Left apical opacity, compatible with known scarring. No acute cardiopulmonary process. Electronically Signed   By: Anner Crete M.D.   On: 11/24/2015 02:02    Assessment & Plan:   Demian was seen today for cough.  Diagnoses and all orders for this visit:  Sepsis due to pneumonia [facility])- it sounds like he is having a recurrence of pneumonia or bacterial exacerbation of COPD, will treat with Augmentin.  Herpes simplex labialis- will treat the infection with Val acyclovir and will treat his oral symptoms with Magic mouthwash.  Other orders -     potassium chloride SA (K-DUR,KLOR-CON) 20 MEQ tablet; Take 1 tablet (20 mEq total) by mouth every morning. Reported on 12/07/2015   I have discontinued Mr. Grulke's  prochlorperazine, predniSONE, and predniSONE. I have also changed his potassium chloride SA. Additionally, I am having him start on valACYclovir, amoxicillin-clavulanate, and FIRST-DUKES MOUTHWASH. Lastly, I am having him maintain his clonazePAM, VOLTAREN, ARIPiprazole, methocarbamol, esomeprazole, nitroGLYCERIN, mirtazapine, Fluticasone-Salmeterol, albuterol, VENTOLIN HFA, FLUoxetine, aspirin, gabapentin, metoprolol succinate, atorvastatin, clopidogrel, STIOLTO RESPIMAT, arformoterol, finasteride, morphine, morphine, theophylline, tamsulosin, isosorbide mononitrate, and Armodafinil.  Meds ordered this encounter  Medications  . Armodafinil  150 MG tablet    Sig:   . potassium chloride SA (K-DUR,KLOR-CON) 20 MEQ tablet    Sig: Take 1 tablet (20 mEq total) by mouth every morning. Reported on 12/07/2015    Dispense:  90 tablet    Refill:  1  . valACYclovir (VALTREX) 1000 MG tablet    Sig: Take 1 tablet (1,000 mg total) by mouth 2 (two) times daily.    Dispense:  20 tablet    Refill:  2  . amoxicillin-clavulanate (AUGMENTIN) 875-125 MG tablet    Sig: Take 1 tablet by mouth 2 (two) times daily.    Dispense:  20 tablet    Refill:  1  . Diphenhyd-Hydrocort-Nystatin (FIRST-DUKES MOUTHWASH) SUSP    Sig: Use as directed 5 mLs in the mouth or throat 4 (four) times daily as needed.    Dispense:  237 mL    Refill:  2     Follow-up: Return in about 6 weeks (around 01/23/2016).  [name], MD

## 2015-12-14 ENCOUNTER — Encounter: Payer: Self-pay | Admitting: Internal Medicine

## 2015-12-14 ENCOUNTER — Other Ambulatory Visit: Payer: Self-pay | Admitting: *Deleted

## 2015-12-14 ENCOUNTER — Ambulatory Visit (INDEPENDENT_AMBULATORY_CARE_PROVIDER_SITE_OTHER): Payer: Medicare HMO | Admitting: Internal Medicine

## 2015-12-14 ENCOUNTER — Ambulatory Visit: Payer: Self-pay | Admitting: *Deleted

## 2015-12-14 VITALS — BP 118/74 | HR 77 | Ht 66.0 in | Wt 205.6 lb

## 2015-12-14 DIAGNOSIS — J9611 Chronic respiratory failure with hypoxia: Secondary | ICD-10-CM | POA: Diagnosis not present

## 2015-12-14 DIAGNOSIS — G4733 Obstructive sleep apnea (adult) (pediatric): Secondary | ICD-10-CM | POA: Diagnosis not present

## 2015-12-14 DIAGNOSIS — J441 Chronic obstructive pulmonary disease with (acute) exacerbation: Secondary | ICD-10-CM

## 2015-12-14 MED ORDER — HYDROCOD POLST-CPM POLST ER 10-8 MG/5ML PO SUER: ORAL | Status: DC

## 2015-12-14 NOTE — Progress Notes (Signed)
Patient ID: Ernest [name], male    DOB: 03-Mar-1956, 60 y.o.   MRN: [id number]  HPI 07/30/11- 40 yoM smoker followed for COPD/ bronchitis, OSA/ CPAP Last here 04/01/11- note reviewed. He had felt better on a trial of maintenance prednisone. He has felt more short of breath through the hot summer, with no acute event and without blood, chest pain or fever. Easy DOE around home. Has been sharing nebulizer with his wife, who now has a cold and we discussed this. Has been worse in last month, and expecially in recent rainy weather. Noting a little ankle swelling. Smothered trying to take a shower.  Only tolerating his CPAP about 3 nights/ week because he feels smothered, waking sore in anterior chest in the mornings.  Now down to only 2-3 cigarettes/ day and trying to stop.  CXR 08/14/10- Nl NAD PFT 05/26/08- FEV1/FVC 2.44/ 0.76, incr 19% w/ BD, R 0.59 ?DLCO 0.69  09/10/11- 55 yoM smoker followed for COPD/ bronchitis, OSA/ CPAP He reports feeling better "finally doing okay". He is down to just a couple of cigarettes a day. He is using CPAP regularly now and is comfortable with it. Alpha I antitrypsin Report came back normal "M. M." on 08/09/2011. Chest x-ray from 08/15/2011 showed stable changes of COPD. He has had flu vaccine.  11/12/11- 46 yoM smoker followed for COPD/ bronchitis, OSA/ CPAP Here with wife. Has had flu shot. He says he was doing well until 2 weeks ago. He again is coughing thick mucus which is difficult to clear. He depends on his metered inhaler and nebulizer to help clear his airways. Started Mucinex 3 days ago. Mucus is white. He denies fever, sore throat, chest pain, GI upset. Because of orthopnea he is sitting up to sleep for the past 2 nights.  01/14/12- 55 yoM smoker followed for COPD/ bronchitis, OSA/ CPAP Increase congested cough green to yellow sputum but he does not feel sick and denies fever. Sister who was a smoker recently died of lung cancer. Despite that and all of our  discussions, he still smokes a few cigarettes daily. We talked again about motivation to stop completely. Continues CPAP and Provigil one or 2 daily.  03/10/12  56 yoM smoker followed for COPD/ bronchitis, OSA/ CPAP Persistent bronchitic cough. Sputum is not purulent. He is "working on" his smoking habit but blames pollen for keeping him S. coughing now. Remains fully compliant with CPAP, all night every night. Still needs Provigil to help with residual daytime sleepiness as before.  05/12/12- 56 yoM smoker followed for COPD/ bronchitis, OSA/ CPAP Stays SOB all the time-even at rest; wheezing, coughing-productive-yellow in color, chest congestion, nasal congestion,. He is more interested today in smoking cessation which we discussed. He had quit for Daliresp after 10 days because of sustained nausea. He is taking all of his regular medicines and using his rescue inhaler several times a day. Off prednisone now for 1-2 months. CXR 01/23/12-  IMPRESSION:  No acute cardiopulmonary abnormality.  Original Report Authenticated By: Randall An, M.D.   07/13/12- 45 yoM smoker followed for COPD/ bronchitis, OSA/ CPAP, complicated by CAD Pt states increased sob,wheezing,productive cough x3 months. Still smoking with no serious effort to stop. His sister has lung cancer which has gotten his attention and he may be willing to try harder. Some days his breathing is better than others with no real trend. Coughing productive of white sputum with no blood. He has all of his medications currently.  07/30/12- 10 yoM  smoker followed for COPD/ bronchitis, OSA/ CPAP, complicated by CAD Cough-productive-yellow and thick; SOB, wheezing, chest congestion, head congestion x 1 week; O2 sat of 83% RA entered room-20 seconds RA sat of 90% CT 07/21/12-  IMPRESSION:  1. Tiny bilateral lung nodules which are similar to on the prior  exam, given differences in slice selection and measurement error.  Likely subpleural lymph  nodes.  2. Age advanced coronary artery atherosclerosis. Recommend  assessment of coronary risk factors and consideration of medical  therapy.  3. Slight enlargement of splenic lesion since 2007. Favored to  represent a hemangioma or lymphangioma.  Original Report Authenticated By: Areta Haber, M.D.   08/07/12- 55 yoM smoker followed for COPD/ bronchitis, OSA/ CPAP, complicated by CAD, disability for back pain. Cough-productive-white mostly; SOB , wheezing-worse at night     wife here Coughing spasms he can feel his upper airway is closing off. He he still smokes a little even when he feels badly and we went over this again today offering support.  11/13/12- 56 yoM smoker followed for COPD/ bronchitis, OSA/ CPAP, complicated by CAD, disability for back pain. FOLLOWS RCV:ELFYB still but not productive-more SOB  Went to ER in September or acute exacerbation of COPD but not admitted. Today he is a scheduled visit. He actually says he is doing pretty well with no acute problems. Perennial nasal congestion. He has reduced his cigarette smoking to about 2 per day and is strongly encouraged to stop completely now.  01/25/13-  57 yoM smoker followed for COPD/ bronchitis, OSA/ CPAP, complicated by CAD, disability for back pain. ACUTE VISIT: started smoking agian-discuss taking Chantix; feels raw in chest area with deep cough x 2 weeks. He feels well at this visit, meaning he is at his baseline some daily nonproductive cough and shortness of breath with exertion.  05/17/13-57 yoM smoker followed for COPD/ bronchitis, OSA/ CPAP, complicated by CAD, disability for back pain. FOLLOWS OFB:PZWCHENID is about the same as last visit; has lessened the about the amount he is smoking. Chantix helps. Continues CPAP/ 10/[facility] Discussed smoking cessation effort.  06/10/13- 57 yoM smoker followed for COPD/ bronchitis, OSA/ CPAP, complicated by CAD, disability for back pain. ACUTE VISIT: prod cough with yellow/green  mucus, increased SOB, wheezing, tightness in chest, chills x1 week.  still on augmentin and pred taper from 7/15 phone note > no better Trying Chantix. Wife is also sick. There exposed to his brother's child who has frequent colds. Just started prednisone taper and Augmentin yesterday. Had teeth pulled.  09/16/13- 57 yoM smoker followed for COPD/ bronchitis, OSA/ CPAP, complicated by CAD, disability for back pain. FOLLOWS FOR: was given the flu shot about 1 month and has not felt good since; continues to keep a headache, chest congestion at night. Describes frontal headache, head and chest congestion since a flu shot. Using Chantix, has reduced smoking to 2 or 3 cigarettes daily. Denies infection-no fever, sputum not purulent or bloody. No chest pain. Stays on Mucinex. Not using CPAP because of head congestion. Sister has died of lung cancer/smoker.  10/21/2013- 57 yoM smoker followed for COPD/ bronchitis, OSA/ CPAP, SqCell CA LUL,complicated by CAD, disability for back pain. FOLLOWS FOR: review bx results with patient; continues to have bad frontal lobe headache and lots of congestion. Nausea last night. No vomiting. Abnl CXR > CT chest/ large LUL mass to hilum. We had notified pt and scheduled bx. He is not coughing up much, denies chest pain. New headache mostly left frontal and  retro-orbital x 1 month with no lateralizing neuro. Remains tight on routine meds, off prednisone.  His wife blames "anxiety" for ER trip/ nebulizer the night before his bronchoscopy. Sister recently died of small cell Ca lung.  CT chest 09/17/13- IMPRESSION:  1. 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 This is associated with  some postobstructive changes in the left upper lobe as well as left  hilar and AP window lymphadenopathy. Based on these findings, and  assuming lack of distant metastatic disease (which will have to be  proven by other imaging  studies), this is favored to represent at  least T2b (if not T3 because of satellite nodules in the left upper  lobe), N2, Mx disease (i.e., at least stage IIIA disease). Further  evaluation with PET-CT and brain MRI with and without IV gadolinium  is suggested at this time for complete staging.  2. Mild centrilobular and paraseptal emphysema.  3. Atherosclerosis, including 3 vessel coronary artery disease.  Assessment for potential risk factor modification, dietary therapy  or pharmacologic therapy may be warranted, if clinically indicated.  4. Additional incidental findings, as above.  Electronically Signed  By: Vinnie Langton M.D.  On: 09/22/2013 11:34  Bronch / needle bx 10/04/13- by Dr Lamonte Sakai Pos Squamous Cell Ca  1/23//15- 57 yoM former smoker followed for COPD/ bronchitis, OSA/ CPAP, SqCell CA LUL/ XRT/Chemo, complicated by CAD, disability for back pain. Myocardial Perfusion 11/19/13- EF 65%, Nl wall motion, no ischemic changes. Follows for: frontal lobe headaches have gone away, SOB constantly, some prod cough with white/clear mucous.  Not smoking.  Nearing completion of current rounds of Chemo/ XRT.  Minor sore throat, breathing ok- some SOB, dry cough.  No recent steroids or ABX except Advair.  Continues CPAP 10/ [facility] with no problem.   01/06/13- 57 yoM former smoker followed for COPD/ bronchitis, OSA/ CPAP, SqCell CA LUL/ XRT/Chemo, complicated by CAD, disability for back pain. ACUTE VISIT:  Increase sob, wheezing and cough with congestion head and chest. Also, has bodyaches and sweats Acute illness x1 week started as head congestion moving to his chest with fever, white sputum turning yellow, myalgias. No blood. Had had flu shot. Has completed radiation and chemotherapy pending next oncology followup. Admits smoking 2 cigarettes since last here.  03/02/14- 67 yoM some-time smoker followed for COPD/ bronchitis, OSA/ CPAP, SqCell CA LUL/ XRT/Chemo, complicated by CAD, disability  for back pain. FOLLOWS FOR: Post hospital; Pt states that continues to have congestion, wheezing, and having left sided chest pain this morning. Had reaction to chemo yesterday-started having trouble breathing and hot flashes-was given breathing tx and injection. Hospitalized in with pneumonia, COPD with asthma 2 weeks ago. CT chest 01/28/2014 showed his left upper lobe mass to be cavitating. Yesterday he had reaction to chemotherapy requiring nebulizer treatment and injection. More dyspnea on exertion at baseline, gradually worse over the past month. Coughing clear phlegm. Nebulizer helps temporarily. This morning head left upper anterior lateral chest pain which is gradually fading. He admits he is smoking some-discussed. Continues CPAP 10/[facility] CXR 02/15/14 IMPRESSION:  No acute findings and no change from the study performed earlier  this same day.  Electronically Signed  By: Lajean Manes M.D.  On: 02/15/2014 09:23  04/19/14- 7 yoM some-time smoker followed for COPD/ bronchitis, OSA/ CPAP, SqCell CA LUL/ XRT/Chemo, complicated by CAD, disability for back pain, DM. FOLLOWS FOR: Breathing is terrible. Recently had PNA-Dr Yoo-still on abx  for this. CPAP 10/ O2 2L/ [facility] for sleep Walk test today 3x 180 m on room air- lowest sat was 92%. Scheduled for 3 more days of Levaquin for pneumonia. Improving significantly now. Cough is productive white. CT chest 04/12/14 IMPRESSION:  1. Favor response to therapy of a residual cavitary left upper lobe  lung lesion. Decreased size of the cavitary component with increased  surrounding soft tissue thickening, favored to be treatment related.  Similarly, evolving radiation change within the surrounding left  upper lobe and left apex. The apical presumed radiation change  obscures the previously described left apical pulmonary nodule.  Recommend attention on follow-up.  2. Similar borderline prevascular adenopathy.  3. Resolved left-sided pleural effusion  with trace left pleural  fluid remaining.  4. Minimal left lower lobe nodularity which could be infectious or  inflammatory. Recommend attention on follow-up.  5. Pulmonary artery enlargement suggests pulmonary arterial  hypertension.  6. Incompletely imaged similar cystic/septated splenic lesion. Favor  a benign lesion such as a lymphangioma.  Electronically Signed  By: Abigail Miyamoto M.D.  On: 04/12/2014 13:57  07/19/14- 13 yoM some-time smoker followed for COPD/ bronchitis, OSA/ CPAP, SqCell CA LUL/ XRT/Chemo, complicated by CAD, disability for back pain, DM.    Wife here FOLLOWS FOR: has had choking cough since last here-productive and clear in color. SOB and wheezing. He says cough is not new and not changed. No blood. Occasional localized left parasternal pain lasts for a day or 2 at a time. He is using rescue inhaler too frequently and we discussed use of maintenance controller and nebulizer machine more appropriately. Takes occasional Nuvigil but still needs to nap. Continues Nexium without recognizing much heartburn.  10/19/14- 58 yoM former smoker followed for COPD/ bronchitis, OSA/ CPAP, SqCell CA LUL/ XRT/Chemo, complicated by CAD, disability for back pain, DM.     FOLLOWS FOR: Increased SOB and wheezing, Chest congestion-unable to cough anything up, chills as well x 2 weeks, no fever, blood, or purulent. Sleeps propped up recliner. Neb 4-5x/ day, frequent rescue inhaler. Sleep O2 2l/ [facility] with CPAP 10 are ok used every night. Needs O2 day/ portable Walk test today - 85% on RA at rest, corrected w O2.. CT 07/19/14 IMPRESSION: Decreased size of cavitary component of left upper lobe mass lesion corresponding to the reported history of the lung cancer. The degree of adjacent volume loss and masslike consolidation of the left upper lobe is increased since previously which may reflect treatment effect secondary to radiation fibrosis. This is amenable to followup and continued  surveillance. No new evidence for intrathoracic metastatic disease. Electronically Signed  By: Conchita Paris M.D.  On: 07/19/2014 14:06  11/15/2014 Acute OV  Complains of cough, congestion , wheezing , dyspnea and tightness for last 3 weeks. Was called in prednisone taper last week  with only minimal improvement . Coughing up thick mucus but unable to get anything up .  Was seen by PCP initially tx w/ Doxycycline on 10/26/14  Seen by cardiology last week , Imdur increased for chest tightness.  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 tx w/ chemo and XRT  CT on 10/21/14 showed stable LUL lesion and increased aspdz in LLL likely from radiation changes. New patchy opacity in RLL suspicious for infection/inflammation.  No evidence of lymphadenopathy.  Pt is followed by [name] and has repeat CT chest planned  in 12/2014.  rec Begin Levaquin 500 mg daily for 7 days Mucinex DM twice daily as needed for cough and congestion Prednisone taper over the next week Fluids and rest  12/15/2014  Acute  ov/Wert re: refractory cough/ wheeze across  the room  No better p pred/levaquin  Chief Complaint  Patient presents with  . Acute Visit    Pt c/o cough and increased SOB for the past month. He has prod cough with minimal white sputum. He states that he is SOB with any exertion- using albuterol inhaler 3-4 x per day and neb with albuterol 5 x per day.   sob at rest is better  when on 3lpm  Walking on up 3lpm  Sleeping about 30 degrees in recliner 3lpm but using neb 3 x nightly and only a little better transiently, no resp to rob with codie, no purulent sputum or hemoptysis gen ant cp with coughing only  CXR PA and Lateral:   12/15/2014 :     I personally reviewed images and agree with radiology impression as follows:    Stable chronic  consolidation and postradiation changes in left upper  lobe. No definite superimposed infiltrate or pulmonary edema.   01/19/15- 63 yoM former smoker followed for COPD/ bronchitis, chronic hypoxic Resp Failure, OSA/ CPAP, SqCell CA LUL/ XRT/Chemo, complicated by CAD, disability for back pain, DM.  FOLLOWS FOR: Pt has restarted Theophylline ER '200mg'$  1 tablet BID and seems to be helping his breathing; would like to have Rx for this. CPAP 10/ O2 3 L/ [facility]  Next CT sched in March He struggles for breathing comfort now at baseline without a distinct acute event recently. He asks to increase the strength of his Advair.  04/19/15-59 yoM former smoker followed for COPD/ bronchitis, chronic hypoxic Resp Failure, OSA/ CPAP, SqCell CA LUL/ XRT/Chemo, complicated by CAD, disability for back pain, DM.  Reports: PT. states COPD is about the same still experiencing wheezing and SOB, pollen is a problem.  Hosp f/u 5/17-5/21- [facility] w  Pneumonia. Now much improved. CPAP 10/ [facility] 3L O2 made him feel smothered so he is not wearing it. Asks hospital bed. He needs an adjustable bed and wife was not strong enough to help him. He is significantly limited by muscle weakness and dyspnea on exertion related to his lung disease and lung cancer. CT chest 04/14/15-image reviewed IMPRESSION: 1. Stable appearance of left upper lobe over multiple prior studies, consistent with post treatment changes. 2. New infiltrate within the right upper lobe and left lower lobe, likely infectious. 3. Emphysematous changes. 4. Stable splenic lesion. Electronically Signed  By: Nolon Nations M.D.  On: 04/14/2015 20:23   ROS-see HPI   Negative unless "+" Constitutional:    weight loss, night sweats, fevers, chills, fatigue, lassitude. HEENT:    headaches, difficulty swallowing, tooth/dental problems, sore throat,       sneezing, itching, ear ache, nasal congestion, post nasal drip, snoring CV:    chest pain, orthopnea, PND,  swelling in lower extremities, anasarca,                                  dizziness, palpitations Resp:   +shortness of breath with exertion or at rest.                +productive cough,   +non-productive cough, coughing up of blood.  change in color of mucus.  +wheezing.   Skin:    rash or lesions. GI:  No-   heartburn, indigestion, abdominal pain, nausea, vomiting,  GU:  MS:   joint pain, stiffness,  Neuro-     nothing unusual Psych:  change in mood or affect.  depression or anxiety.   memory loss.     Objective:   OBJ- Physical Exam General- Alert, Oriented, Affect-appropriate, Distress- none acute, + chronically ill    appears comfortable sitting quietly on room air Skin- rash-none, lesions- none, excoriation- none Lymphadenopathy- none Head- atraumatic            Eyes- Gross vision intact, PERRLA, conjunctivae and secretions clear            Ears- Hearing, canals-normal            Nose- Clear, no-Septal dev, mucus, polyps, erosion, perforation             Throat- Mallampati II , mucosa clear , drainage- none, tonsils- atrophic, + hoarse Neck- flexible , trachea midline, no stridor , thyroid nl, carotid no bruit Chest - symmetrical excursion , unlabored           Heart/CV- RRR , no murmur , no gallop  , no rub, nl s1 s2                           - JVD- none , edema- none, stasis changes- none, varices- none           Lung- + distant, wheeze + faint, cough- none , dullness-none, rub- none           Chest wall-  Abd-  Br/ Gen/ Rectal- Not done, not indicated Extrem- cyanosis- none, clubbing, none, atrophy- none, strength- nl Neuro- grossly intact to observation          Expand All Collapse All   Patient ID: Ernest [name], male DOB: August 18, 1956, 60 y.o. MRN: [id number]  HPI 07/30/11- 51 yoM smoker followed for COPD/ bronchitis, OSA/ CPAP Last here 04/01/11- note reviewed. He had felt better on a trial of maintenance prednisone. He has felt more short of  breath through the hot summer, with no acute event and without blood, chest pain or fever. Easy DOE around home. Has been sharing nebulizer with his wife, who now has a cold and we discussed this. Has been worse in last month, and expecially in recent rainy weather. Noting a little ankle swelling. Smothered trying to take a shower.  Only tolerating his CPAP about 3 nights/ week because he feels smothered, waking sore in anterior chest in the mornings.  Now down to only 2-3 cigarettes/ day and trying to stop.  CXR 08/14/10- Nl NAD PFT 05/26/08- FEV1/FVC 2.44/ 0.76, incr 19% w/ BD, R 0.59 ?DLCO 0.69  09/10/11- 55 yoM smoker followed for COPD/ bronchitis, OSA/ CPAP He reports feeling better "finally doing okay". He is down to just a couple of cigarettes a day. He is using CPAP regularly now and is comfortable with it. Alpha I antitrypsin Report came back normal "M. M." on 08/09/2011. Chest x-ray from 08/15/2011 showed stable changes of COPD. He has had flu vaccine.  11/12/11- 78 yoM smoker followed for COPD/ bronchitis, OSA/ CPAP Here with wife. Has had flu shot. He says he was doing well until 2 weeks ago. He again is coughing thick mucus which is difficult to clear. He depends on his metered inhaler and nebulizer to  help clear his airways. Started Mucinex 3 days ago. Mucus is white. He denies fever, sore throat, chest pain, GI upset. Because of orthopnea he is sitting up to sleep for the past 2 nights.  01/14/12- 55 yoM smoker followed for COPD/ bronchitis, OSA/ CPAP Increase congested cough green to yellow sputum but he does not feel sick and denies fever. Sister who was a smoker recently died of lung cancer. Despite that and all of our discussions, he still smokes a few cigarettes daily. We talked again about motivation to stop completely. Continues CPAP and Provigil one or 2 daily.  03/10/12 56 yoM smoker followed for COPD/ bronchitis, OSA/ CPAP Persistent bronchitic cough. Sputum is not  purulent. He is "working on" his smoking habit but blames pollen for keeping him S. coughing now. Remains fully compliant with CPAP, all night every night. Still needs Provigil to help with residual daytime sleepiness as before.  05/12/12- 56 yoM smoker followed for COPD/ bronchitis, OSA/ CPAP Stays SOB all the time-even at rest; wheezing, coughing-productive-yellow in color, chest congestion, nasal congestion,. He is more interested today in smoking cessation which we discussed. He had quit for Daliresp after 10 days because of sustained nausea. He is taking all of his regular medicines and using his rescue inhaler several times a day. Off prednisone now for 1-2 months. CXR 01/23/12-  IMPRESSION:  No acute cardiopulmonary abnormality.  Original Report Authenticated By: Randall An, M.D.   07/13/12- 51 yoM smoker followed for COPD/ bronchitis, OSA/ CPAP, complicated by CAD Pt states increased sob,wheezing,productive cough x3 months. Still smoking with no serious effort to stop. His sister has lung cancer which has gotten his attention and he may be willing to try harder. Some days his breathing is better than others with no real trend. Coughing productive of white sputum with no blood. He has all of his medications currently.  07/30/12- 56 yoM smoker followed for COPD/ bronchitis, OSA/ CPAP, complicated by CAD Cough-productive-yellow and thick; SOB, wheezing, chest congestion, head congestion x 1 week; O2 sat of 83% RA entered room-20 seconds RA sat of 90% CT 07/21/12-  IMPRESSION:  1. Tiny bilateral lung nodules which are similar to on the prior  exam, given differences in slice selection and measurement error.  Likely subpleural lymph nodes.  2. Age advanced coronary artery atherosclerosis. Recommend  assessment of coronary risk factors and consideration of medical  therapy.  3. Slight enlargement of splenic lesion since 2007. Favored to  represent a hemangioma or lymphangioma.   Original Report Authenticated By: Areta Haber, M.D.   08/07/12- 44 yoM smoker followed for COPD/ bronchitis, OSA/ CPAP, complicated by CAD, disability for back pain. Cough-productive-white mostly; SOB , wheezing-worse at night wife here Coughing spasms he can feel his upper airway is closing off. He he still smokes a little even when he feels badly and we went over this again today offering support.  11/13/12- 56 yoM smoker followed for COPD/ bronchitis, OSA/ CPAP, complicated by CAD, disability for back pain. FOLLOWS PTW:SFKCL still but not productive-more SOB  Went to ER in September or acute exacerbation of COPD but not admitted. Today he is a scheduled visit. He actually says he is doing pretty well with no acute problems. Perennial nasal congestion. He has reduced his cigarette smoking to about 2 per day and is strongly encouraged to stop completely now.  01/25/13- 57 yoM smoker followed for COPD/ bronchitis, OSA/ CPAP, complicated by CAD, disability for back pain. ACUTE VISIT: started smoking agian-discuss  taking Chantix; feels raw in chest area with deep cough x 2 weeks. He feels well at this visit, meaning he is at his baseline some daily nonproductive cough and shortness of breath with exertion.  05/17/13-57 yoM smoker followed for COPD/ bronchitis, OSA/ CPAP, complicated by CAD, disability for back pain. FOLLOWS KGU:[id number] is about the same as last visit; has lessened the about the amount he is smoking. Chantix helps. Continues CPAP/ 10/[facility] Discussed smoking cessation effort.  06/10/13- 57 yoM smoker followed for COPD/ bronchitis, OSA/ CPAP, complicated by CAD, disability for back pain. ACUTE VISIT: prod cough with yellow/green mucus, increased SOB, wheezing, tightness in chest, chills x1 week. still on augmentin and pred taper from 7/15 phone note > no better Trying Chantix. Wife is also sick. There exposed to his brother's child who has frequent colds. Just started  prednisone taper and Augmentin yesterday. Had teeth pulled.  09/16/13- 57 yoM smoker followed for COPD/ bronchitis, OSA/ CPAP, complicated by CAD, disability for back pain. FOLLOWS FOR: was given the flu shot about 1 month and has not felt good since; continues to keep a headache, chest congestion at night. Describes frontal headache, head and chest congestion since a flu shot. Using Chantix, has reduced smoking to 2 or 3 cigarettes daily. Denies infection-no fever, sputum not purulent or bloody. No chest pain. Stays on Mucinex. Not using CPAP because of head congestion. Sister has died of lung cancer/smoker.  11/04/13- 57 yoM smoker followed for COPD/ bronchitis, OSA/ CPAP, SqCell CA LUL,complicated by CAD, disability for back pain. FOLLOWS FOR: review bx results with patient; continues to have bad frontal lobe headache and lots of congestion. Nausea last night. No vomiting. Abnl CXR > CT chest/ large LUL mass to hilum. We had notified pt and scheduled bx. He is not coughing up much, denies chest pain. New headache mostly left frontal and retro-orbital x 1 month with no lateralizing neuro. Remains tight on routine meds, off prednisone.  His wife blames "anxiety" for ER trip/ nebulizer the night before his bronchoscopy. Sister recently died of small cell Ca lung.  CT chest 09/17/13- IMPRESSION:  1. 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 This is associated with  some postobstructive changes in the left upper lobe as well as left  hilar and AP window lymphadenopathy. Based on these findings, and  assuming lack of distant metastatic disease (which will have to be  proven by other imaging studies), this is favored to represent at  least T2b (if not T3 because of satellite nodules in the left upper  lobe), N2, Mx disease (i.e., at least stage IIIA disease). Further  evaluation with PET-CT and brain MRI with and without IV  gadolinium  is suggested at this time for complete staging.  2. Mild centrilobular and paraseptal emphysema.  3. Atherosclerosis, including 3 vessel coronary artery disease.  Assessment for potential risk factor modification, dietary therapy  or pharmacologic therapy may be warranted, if clinically indicated.  4. Additional incidental findings, as above.  Electronically Signed  By: Vinnie Langton M.D.  On: 09/22/2013 11:34  Bronch / needle bx 10/04/13- by Dr Lamonte Sakai Pos Squamous Cell Ca  1/23//15- 57 yoM former smoker followed for COPD/ bronchitis, OSA/ CPAP, SqCell CA LUL/ XRT/Chemo, complicated by CAD, disability for back pain. Myocardial Perfusion 11/19/13- EF 65%, Nl wall motion, no ischemic changes. Follows for: frontal lobe headaches have gone away, SOB constantly, some prod cough with white/clear mucous.  Not smoking.  Nearing completion of current rounds of Chemo/ XRT.  Minor sore throat, breathing ok- some SOB, dry cough.  No recent steroids or ABX except Advair.  Continues CPAP 10/ [facility] with no problem.   01/06/13- 57 yoM former smoker followed for COPD/ bronchitis, OSA/ CPAP, SqCell CA LUL/ XRT/Chemo, complicated by CAD, disability for back pain. ACUTE VISIT: Increase sob, wheezing and cough with congestion head and chest. Also, has bodyaches and sweats Acute illness x1 week started as head congestion moving to his chest with fever, white sputum turning yellow, myalgias. No blood. Had had flu shot. Has completed radiation and chemotherapy pending next oncology followup. Admits smoking 2 cigarettes since last here.  03/02/14- 38 yoM some-time smoker followed for COPD/ bronchitis, OSA/ CPAP, SqCell CA LUL/ XRT/Chemo, complicated by CAD, disability for back pain. FOLLOWS FOR: Post hospital; Pt states that continues to have congestion, wheezing, and having left sided chest pain this morning. Had reaction to chemo yesterday-started having trouble breathing and hot  flashes-was given breathing tx and injection. Hospitalized in with pneumonia, COPD with asthma 2 weeks ago. CT chest 01/28/2014 showed his left upper lobe mass to be cavitating. Yesterday he had reaction to chemotherapy requiring nebulizer treatment and injection. More dyspnea on exertion at baseline, gradually worse over the past month. Coughing clear phlegm. Nebulizer helps temporarily. This morning head left upper anterior lateral chest pain which is gradually fading. He admits he is smoking some-discussed. Continues CPAP 10/[facility] CXR 02/15/14 IMPRESSION:  No acute findings and no change from the study performed earlier  this same day.  Electronically Signed  By: Lajean Manes M.D.  On: 02/15/2014 09:23  04/19/14- 73 yoM some-time smoker followed for COPD/ bronchitis, OSA/ CPAP, SqCell CA LUL/ XRT/Chemo, complicated by CAD, disability for back pain, DM. FOLLOWS FOR: Breathing is terrible. Recently had PNA-Dr Yoo-still on abx for this. CPAP 10/ O2 2L/ [facility] for sleep Walk test today 3x 180 m on room air- lowest sat was 92%. Scheduled for 3 more days of Levaquin for pneumonia. Improving significantly now. Cough is productive white. CT chest 04/12/14 IMPRESSION:  1. Favor response to therapy of a residual cavitary left upper lobe  lung lesion. Decreased size of the cavitary component with increased  surrounding soft tissue thickening, favored to be treatment related.  Similarly, evolving radiation change within the surrounding left  upper lobe and left apex. The apical presumed radiation change  obscures the previously described left apical pulmonary nodule.  Recommend attention on follow-up.  2. Similar borderline prevascular adenopathy.  3. Resolved left-sided pleural effusion with trace left pleural  fluid remaining.  4. Minimal left lower lobe nodularity which could be infectious or  inflammatory. Recommend attention on follow-up.  5. Pulmonary artery enlargement  suggests pulmonary arterial  hypertension.  6. Incompletely imaged similar cystic/septated splenic lesion. Favor  a benign lesion such as a lymphangioma.  Electronically Signed  By: Abigail Miyamoto M.D.  On: 04/12/2014 13:57  07/19/14- 52 yoM some-time smoker followed for COPD/ bronchitis, OSA/ CPAP, SqCell CA LUL/ XRT/Chemo, complicated by CAD, disability for back pain, DM. Wife here FOLLOWS FOR: has had choking cough since last here-productive and clear in color. SOB and wheezing. He says cough is not new and not changed. No blood. Occasional localized left parasternal pain lasts for a day or 2 at a time. He is using rescue inhaler too frequently and we discussed use of maintenance controller and nebulizer machine more appropriately. Takes occasional Nuvigil but still needs to nap. Continues  Nexium without recognizing much heartburn.  10/19/14- 58 yoM former smoker followed for COPD/ bronchitis, OSA/ CPAP, SqCell CA LUL/ XRT/Chemo, complicated by CAD, disability for back pain, DM.  FOLLOWS FOR: Increased SOB and wheezing, Chest congestion-unable to cough anything up, chills as well x 2 weeks, no fever, blood, or purulent. Sleeps propped up recliner. Neb 4-5x/ day, frequent rescue inhaler. Sleep O2 2l/ [facility] with CPAP 10 are ok used every night. Needs O2 day/ portable Walk test today - 85% on RA at rest, corrected w O2.. CT 07/19/14 IMPRESSION: Decreased size of cavitary component of left upper lobe mass lesion corresponding to the reported history of the lung cancer. The degree of adjacent volume loss and masslike consolidation of the left upper lobe is increased since previously which may reflect treatment effect secondary to radiation fibrosis. This is amenable to followup and continued surveillance. No new evidence for intrathoracic metastatic disease. Electronically Signed  By: Conchita Paris M.D.  On: 07/19/2014 14:06  11/15/2014 Acute OV  Complains of cough,  congestion , wheezing , dyspnea and tightness for last 3 weeks. Was called in prednisone taper last week with only minimal improvement . Coughing up thick mucus but unable to get anything up .  Was seen by PCP initially tx w/ Doxycycline on 10/26/14  Seen by cardiology last week , Imdur increased for chest tightness.  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 tx w/ chemo and XRT  CT on 10/21/14 showed stable LUL lesion and increased aspdz in LLL likely from radiation changes. New patchy opacity in RLL suspicious for infection/inflammation.  No evidence of lymphadenopathy.  Pt is followed by [name] and has repeat CT chest planned in 12/2014.  rec Begin Levaquin 500 mg daily for 7 days Mucinex DM twice daily as needed for cough and congestion Prednisone taper over the next week Fluids and rest  12/15/2014 Acute ov/Wert re: refractory cough/ wheeze across the room No better p pred/levaquin  Chief Complaint  Patient presents with  . Acute Visit    Pt c/o cough and increased SOB for the past month. He has prod cough with minimal white sputum. He states that he is SOB with any exertion- using albuterol inhaler 3-4 x per day and neb with albuterol 5 x per day.   sob at rest is better when on 3lpm  Walking on up 3lpm  Sleeping about 30 degrees in recliner 3lpm but using neb 3 x nightly and only a little better transiently, no resp to rob with codie, no purulent sputum or hemoptysis gen ant cp with coughing only  CXR PA and Lateral: 12/15/2014 :  I personally reviewed images and agree with radiology impression as follows:  Stable chronic consolidation and postradiation changes in left upper  lobe. No definite superimposed infiltrate or pulmonary edema.  01/19/15- 38 yoM former smoker followed for COPD/  bronchitis, chronic hypoxic Resp Failure, OSA/ CPAP, SqCell CA LUL/ XRT/Chemo, complicated by CAD, disability for back pain, DM.  FOLLOWS FOR: Pt has restarted Theophylline ER '200mg'$  1 tablet BID and seems to be helping his breathing; would like to have Rx for this. CPAP 10/ O2 3 L/ [facility]  Next CT sched in March He struggles for breathing comfort now at baseline without a distinct acute event recently. He asks to increase the strength of his Advair.  04/19/15-59 yoM former smoker followed for  COPD/ bronchitis, chronic hypoxic Resp Failure, OSA/ CPAP, SqCell CA LUL/ XRT/Chemo, complicated by CAD, disability for back pain, DM.  Reports: PT. states COPD is about the same still experiencing wheezing and SOB, pollen is a problem.  Hosp f/u 5/17-5/21- [facility] w Pneumonia. Now much improved. CPAP 10/ [facility] 3L O2 made him feel smothered so he is not wearing it. Asks hospital bed. He needs an adjustable bed and wife was not strong enough to help him. He is significantly limited by muscle weakness and dyspnea on exertion related to his lung disease and lung cancer. CT chest 04/14/15-image reviewed IMPRESSION: 1. Stable appearance of left upper lobe over multiple prior studies, consistent with post treatment changes. 2. New infiltrate within the right upper lobe and left lower lobe, likely infectious. 3. Emphysematous changes. 4. Stable splenic lesion. Electronically Signed  By: Nolon Nations M.D.  On: 04/14/2015 20:23       07/14/15- Acute- TP   Pt presents for an acute office visit.  Complains of head and chest congestion, PND, sore throat and increased SOB. Prod cough with thick white mucus since 06/21/15. Denies any fever, nausea or vomiting.  On Advair , did not like stiolto  Taking mucinex without much help.  Denies chest pain, orthopnea, edema or fever.   09/22/15- 17 yoM former smoker followed for COPD/ bronchitis, chronic hypoxic Resp Failure, OSA/ CPAP, SqCell CA LUL/  XRT/Chemo, complicated by CAD, disability for back pain, DM.  Follow for: Increased SOB on exertion, wheezing, prod cough with white mucus.Pt states that after coughing a lot he will get dizzy for a short period of time. Head and chest congestion has come back. Ran out of Theophylline x 4 days, coughing got worse. Pt was ableto get a refill and has been taking Theophylline for the past 2 days.  Not doing well, always short of breath. Wears oxygen at night and at rest at home but came without it here. Uses a small face mask with his portable oxygen and describes putting his nebulizer solution liquid into that mask, thinking he gets some benefit from inhaling that with his oxygen when he is moving around. Left anterior chest pain, persistent, hurts to press on the area. Sees cardiology. Recent CT scan ordered by oncology and reviewed. Tussive lightheadedness without syncope being treated with Mucinex DM. Scant mucus is clear. Rescue Ventolin inhaler little help. Nasal congestion, difficult breathing through his nose at times. Not much discharge. Had flu vaccine CT images reviewed CT chest 09/06/15 IMPRESSION: 1. Stable post treatment change in the central left upper lung, with no evidence of local tumor recurrence. 2. Interval resolution of previously described inflammatory opacity in the right upper and left lower lobes. 3. Mild interval growth of an indeterminate right lower lobe 5 mm ground-glass pulmonary nodule, metastasis not excluded. Two new tiny right upper lobe pulmonary nodules, indeterminate, possibly residual inflammatory foci given the recent infection in this location. 4. Stable mild subcarinal mediastinal lymphadenopathy. 5. Mild centrilobular emphysema. 6. Stable dilated main pulmonary artery suggesting chronic pulmonary arterial hypertension. 7. Atherosclerosis, including three-vessel coronary artery disease. 8. Healed anterior left second rib deformity. 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 Electronically Signed  By: Ilona Sorrel M.D.  On: 09/06/2015 12:05  12/14/2015-59 yoM former smoker followed for COPD/ bronchitis, chronic hypoxic Resp Failure, OSA/ CPAP, SqCell CA LUL/ XRT/Chemo, complicated by CAD, disability for back pain, DM. O2 3 L/CPAP [facility] CXR 11/25/2015 IMPRESSION: No acute abnormality.  Scarring at the left apex. Electronically Signed  By: Lorriane Shire M.D.  On: 11/25/2015 07:27 Follow-up chest CT scheduled for 01/12/2016 by oncology Acute hospital 11/24/2015-11/30/2015 for acute on chronic respiratory failure with hypoxia and hypercapnia. Treated with cef/zith and discharged on prednisone taper. [facility] for PNA; recently followed up with PCP (Dr [name]) and no updated CXR done. Pt states he is slowing getting better. Has chronic wheeze and cough but he feels stable and controlled on current meds. He does not identify specific needs at this visit, recognizing gradual recovery, except he will asks refill cough syrup. Describes good compliance with oxygen and CPAP for sleep and definitely feels better using them.  ROS-see HPI   Negative unless "+" Constitutional:    weight loss, night sweats, fevers, chills, fatigue, lassitude. HEENT:    headaches, difficulty swallowing, tooth/dental problems, sore throat,       sneezing, itching, ear ache, + nasal congestion, post nasal drip, snoring CV:    chest pain, orthopnea, PND, swelling in lower extremities, anasarca,                                                dizziness, palpitations Resp:   +shortness of breath with exertion or at rest.                productive cough,  + non-productive cough, coughing up of blood.              change in color of mucus.  wheezing.   Skin:    rash or lesions. GI:  No-   heartburn, indigestion, abdominal pain, nausea, vomiting, GU: . MS:   joint pain,  stiffness, . Neuro-     nothing unusual Psych:  change in mood or affect.  depression or anxiety.   memory loss.  OBJ- Physical Exam General- Alert, Oriented, Affect-appropriate, Distress- none acute, + overweight, + resting room air sat 97% Skin- + steroid ecchymoses Lymphadenopathy- none Head- atraumatic            Eyes- Gross vision intact, PERRLA, conjunctivae and secretions clear            Ears- Hearing, canals-normal            Nose- Clear, no-Septal dev, mucus, polyps, erosion, perforation             Throat- Mallampati II , mucosa clear , drainage- none, tonsils- atrophic Neck- flexible , trachea midline, no stridor , thyroid nl, carotid no bruit Chest - symmetrical excursion , unlabored           Heart/CV- RRR , no murmur , no gallop  , no rub, nl s1 s2                           - JVD- none , edema- none, stasis changes- none, varices- none           Lung- + distant without rhonchi, wheeze + light, cough- none , dullness-none, rub- none           Chest wall-  Abd-  Br/ Gen/ Rectal- Not done, not indicated Extrem- cyanosis- none, clubbing, none, atrophy- none, strength- nl Neuro- grossly intact to observation

## 2015-12-14 NOTE — Patient Instructions (Signed)
Ok to continue your routine meds  Script refilling tussionex  Please call if we can help

## 2015-12-14 NOTE — Assessment & Plan Note (Signed)
CPAP with oxygen being used all night every night. It controls snoring and quality of life is improved

## 2015-12-14 NOTE — Patient Outreach (Signed)
Attempt made to contact pt for scheduled transition of care call (week 3, discharged 11/30/15).    HIPPA compliant voice message left with contact number.  If no response, will try again.       Zara Chess.   [place] [facility]  [phone]

## 2015-12-14 NOTE — Assessment & Plan Note (Signed)
Continues dependent on oxygen for sleep, used reliably

## 2015-12-14 NOTE — Assessment & Plan Note (Signed)
He recognizes that improvement will be slow with no acute needs identified except refill cough syrup-discussed

## 2015-12-19 DIAGNOSIS — J449 Chronic obstructive pulmonary disease, unspecified: Secondary | ICD-10-CM | POA: Diagnosis not present

## 2015-12-20 ENCOUNTER — Other Ambulatory Visit: Payer: Self-pay | Admitting: *Deleted

## 2015-12-20 NOTE — Patient Outreach (Signed)
Attempt made to contact pt for transition of care (week 4, discharged 11/30/15).    HIPPA compliant voice message left with contact number.  If no response, will try again.      Zara Chess.   [place] [facility]  [phone]

## 2015-12-20 NOTE — Patient Outreach (Signed)
Transition of care call:  Spoke with spouse who states pt not available at this time, call back, has MD on the other line.  Plan to call back.      Zara Chess.   [place] [facility]  [phone]

## 2015-12-25 DIAGNOSIS — C342 Malignant neoplasm of middle lobe, bronchus or lung: Secondary | ICD-10-CM | POA: Diagnosis not present

## 2015-12-25 DIAGNOSIS — J9601 Acute respiratory failure with hypoxia: Secondary | ICD-10-CM | POA: Diagnosis not present

## 2015-12-25 DIAGNOSIS — J449 Chronic obstructive pulmonary disease, unspecified: Secondary | ICD-10-CM | POA: Diagnosis not present

## 2015-12-26 DIAGNOSIS — F3341 Major depressive disorder, recurrent, in partial remission: Secondary | ICD-10-CM | POA: Diagnosis not present

## 2015-12-28 ENCOUNTER — Other Ambulatory Visit: Payer: Self-pay | Admitting: *Deleted

## 2015-12-28 NOTE — Patient Outreach (Signed)
Third attempt made to contact pt as part of ongoing transition of care.  HIPPA compliant voice message left with name and contact number.   Home visit done with pt  1/12, unable to contact thereafter.  If no response, unable to contact letter to be sent, if pt does not respond to letter in 10 business days, plan to close case.      Zara Chess.   [facility] [facility]  [phone]

## 2015-12-29 ENCOUNTER — Encounter: Payer: Self-pay | Admitting: *Deleted

## 2016-01-03 ENCOUNTER — Other Ambulatory Visit: Payer: Self-pay | Admitting: Internal Medicine

## 2016-01-04 ENCOUNTER — Other Ambulatory Visit: Payer: Self-pay | Admitting: Internal Medicine

## 2016-01-04 DIAGNOSIS — J449 Chronic obstructive pulmonary disease, unspecified: Secondary | ICD-10-CM | POA: Diagnosis not present

## 2016-01-08 ENCOUNTER — Encounter: Payer: Self-pay | Admitting: *Deleted

## 2016-01-08 ENCOUNTER — Encounter: Payer: Self-pay | Admitting: Internal Medicine

## 2016-01-08 ENCOUNTER — Other Ambulatory Visit (INDEPENDENT_AMBULATORY_CARE_PROVIDER_SITE_OTHER): Payer: Medicare HMO

## 2016-01-08 ENCOUNTER — Ambulatory Visit (INDEPENDENT_AMBULATORY_CARE_PROVIDER_SITE_OTHER): Payer: Medicare HMO | Admitting: Internal Medicine

## 2016-01-08 ENCOUNTER — Other Ambulatory Visit: Payer: Self-pay | Admitting: *Deleted

## 2016-01-08 VITALS — BP 130/86 | HR 66 | Temp 98.2°F | Resp 16 | Ht 66.0 in | Wt 209.0 lb

## 2016-01-08 DIAGNOSIS — E119 Type 2 diabetes mellitus without complications: Secondary | ICD-10-CM

## 2016-01-08 DIAGNOSIS — G894 Chronic pain syndrome: Secondary | ICD-10-CM | POA: Diagnosis not present

## 2016-01-08 DIAGNOSIS — I2510 Atherosclerotic heart disease of native coronary artery without angina pectoris: Secondary | ICD-10-CM

## 2016-01-08 DIAGNOSIS — R072 Precordial pain: Secondary | ICD-10-CM | POA: Diagnosis not present

## 2016-01-08 DIAGNOSIS — K2100 Gastro-esophageal reflux disease with esophagitis, without bleeding: Secondary | ICD-10-CM

## 2016-01-08 DIAGNOSIS — I10 Essential (primary) hypertension: Secondary | ICD-10-CM

## 2016-01-08 LAB — CARDIAC PANEL
CK-MB: 8.4 ng/mL — ABNORMAL HIGH (ref 0.3–4.0)
Relative Index: 5.8 calc — ABNORMAL HIGH (ref 0.0–2.5)
Total CK: 145 U/L (ref 7–232)

## 2016-01-08 LAB — HEMOGLOBIN A1C: Hgb A1c MFr Bld: 6.8 % — ABNORMAL HIGH (ref 4.6–6.5)

## 2016-01-08 LAB — TROPONIN I: TNIDX: 0.01 ug/l (ref 0.00–0.06)

## 2016-01-08 MED ORDER — MORPHINE SULFATE ER 30 MG PO TBCR: 30.0000 mg | EXTENDED_RELEASE_TABLET | Freq: Two times a day (BID) | ORAL | Status: DC

## 2016-01-08 MED ORDER — MORPHINE SULFATE 15 MG PO TABS: 15.0000 mg | ORAL_TABLET | ORAL | Status: DC | PRN

## 2016-01-08 NOTE — Patient Outreach (Signed)
Received a call from pt today in response to unable to contact letter sent.   Pt apologized for not getting RN CM's calls.  Pt reports recovery is slow, has another MD appointment (Dr. Ronnald Ramp) today, mucous discolored, taking medications as ordered (nebulizer treatments).   RN CM discussed doing a home visit to which pt agreed, scheduled for 2/21.     Zara Chess.   [facility]  [phone]

## 2016-01-08 NOTE — Patient Instructions (Signed)
Nonspecific Chest Pain  °Chest pain can be caused by many different conditions. There is always a chance that your pain could be related to something serious, such as a heart attack or a blood clot in your lungs. Chest pain can also be caused by conditions that are not life-threatening. If you have chest pain, it is very important to follow up with your health care provider. °CAUSES  °Chest pain can be caused by: °· Heartburn. °· Pneumonia or bronchitis. °· Anxiety or stress. °· Inflammation around your heart (pericarditis) or lung (pleuritis or pleurisy). °· A blood clot in your lung. °· A collapsed lung (pneumothorax). It can develop suddenly on its own (spontaneous pneumothorax) or from trauma to the chest. °· Shingles infection (varicella-zoster virus). °· Heart attack. °· Damage to the bones, muscles, and cartilage that make up your chest wall. This can include: °¨ Bruised bones due to injury. °¨ Strained muscles or cartilage due to frequent or repeated coughing or overwork. °¨ Fracture to one or more ribs. °¨ Sore cartilage due to inflammation (costochondritis). °RISK FACTORS  °Risk factors for chest pain may include: °· Activities that increase your risk for trauma or injury to your chest. °· Respiratory infections or conditions that cause frequent coughing. °· Medical conditions or overeating that can cause heartburn. °· Heart disease or family history of heart disease. °· Conditions or health behaviors that increase your risk of developing a blood clot. °· Having had chicken pox (varicella zoster). °SIGNS AND SYMPTOMS °Chest pain can feel like: °· Burning or tingling on the surface of your chest or deep in your chest. °· Crushing, pressure, aching, or squeezing pain. °· Dull or sharp pain that is worse when you move, cough, or take a deep breath. °· Pain that is also felt in your back, neck, shoulder, or arm, or pain that spreads to any of these areas. °Your chest pain may come and go, or it may stay  constant. °DIAGNOSIS °Lab tests or other studies may be needed to find the cause of your pain. Your health care provider may have you take a test called an ambulatory ECG (electrocardiogram). An ECG records your heartbeat patterns at the time the test is performed. You may also have other tests, such as: °· Transthoracic echocardiogram (TTE). During echocardiography, sound waves are used to create a picture of all of the heart structures and to look at how blood flows through your heart. °· Transesophageal echocardiogram (TEE). This is a more advanced imaging test that obtains images from inside your body. It allows your health care provider to see your heart in finer detail. °· Cardiac monitoring. This allows your health care provider to monitor your heart rate and rhythm in real time. °· Holter monitor. This is a portable device that records your heartbeat and can help to diagnose abnormal heartbeats. It allows your health care provider to track your heart activity for several days, if needed. °· Stress tests. These can be done through exercise or by taking medicine that makes your heart beat more quickly. °· Blood tests. °· Imaging tests. °TREATMENT  °Your treatment depends on what is causing your chest pain. Treatment may include: °· Medicines. These may include: °¨ Acid blockers for heartburn. °¨ Anti-inflammatory medicine. °¨ Pain medicine for inflammatory conditions. °¨ Antibiotic medicine, if an infection is present. °¨ Medicines to dissolve blood clots. °¨ Medicines to treat coronary artery disease. °· Supportive care for conditions that do not require medicines. This may include: °¨ Resting. °¨ Applying heat   or cold packs to injured areas. °¨ Limiting activities until pain decreases. °HOME CARE INSTRUCTIONS °· If you were prescribed an antibiotic medicine, finish it all even if you start to feel better. °· Avoid any activities that bring on chest pain. °· Do not use any tobacco products, including  cigarettes, chewing tobacco, or electronic cigarettes. If you need help quitting, ask your health care provider. °· Do not drink alcohol. °· Take medicines only as directed by your health care provider. °· Keep all follow-up visits as directed by your health care provider. This is important. This includes any further testing if your chest pain does not go away. °· If heartburn is the cause for your chest pain, you may be told to keep your head raised (elevated) while sleeping. This reduces the chance that acid will go from your stomach into your esophagus. °· Make lifestyle changes as directed by your health care provider. These may include: °¨ Getting regular exercise. Ask your health care provider to suggest some activities that are safe for you. °¨ Eating a heart-healthy diet. A registered dietitian can help you to learn healthy eating options. °¨ Maintaining a healthy weight. °¨ Managing diabetes, if necessary. °¨ Reducing stress. °SEEK MEDICAL CARE IF: °· Your chest pain does not go away after treatment. °· You have a rash with blisters on your chest. °· You have a fever. °SEEK IMMEDIATE MEDICAL CARE IF:  °· Your chest pain is worse. °· You have an increasing cough, or you cough up blood. °· You have severe abdominal pain. °· You have severe weakness. °· You faint. °· You have chills. °· You have sudden, unexplained chest discomfort. °· You have sudden, unexplained discomfort in your arms, back, neck, or jaw. °· You have shortness of breath at any time. °· You suddenly start to sweat, or your skin gets clammy. °· You feel nauseous or you vomit. °· You suddenly feel light-headed or dizzy. °· Your heart begins to beat quickly, or it feels like it is skipping beats. °These symptoms may represent a serious problem that is an emergency. Do not wait to see if the symptoms will go away. Get medical help right away. Call your local emergency services (911 in the [place] Do not drive yourself to the hospital. °  °This  information is not intended to replace advice given to you by your health care provider. Make sure you discuss any questions you have with your health care provider. °  °Document Released: 08/21/2005 Document Revised: 12/02/2014 Document Reviewed: 06/17/2014 °Elsevier Interactive Patient Education ©2016 [employer] ° °

## 2016-01-08 NOTE — Progress Notes (Signed)
Pre visit review using our clinic review tool, if applicable. No additional management support is needed unless otherwise documented below in the visit note. 

## 2016-01-08 NOTE — Progress Notes (Signed)
Subjective:  Patient ID: Ernest [name], male    DOB: 07/20/1956  Age: 60 y.o. MRN: [id number]  CC: Chest Pain   HPI  [name] presents for a 3 week history of chest pain that he describes as a dull ache in the left precordium with dyspnea on exertion, fatigue, dizziness, and lightheadedness. He had a stress Myoview performed in July 2006 with the results as listed; Nuclear stress EF: 56%.  There was no ST segment deviation noted during stress.  Defect 1: There is a small defect of mild severity present in the basal inferior location that is fixed and consistent with diaphragmatic attenuation. No ischemia noted.  This is a low risk study. The left ventricular ejection fraction is normal (55-65%).  Outpatient Prescriptions Prior to Visit  Medication Sig Dispense Refill  . albuterol (PROVENTIL) (2.5 MG/3ML) 0.083% nebulizer solution INHALE 1 VIAL IN NEBULIZER EVERY 4 HOURS AS NEEDED FOR WHEEZING OR SHORTNESS OF BREATH 300 mL 11  . arformoterol (BROVANA) 15 MCG/2ML NEBU Take 2 mLs (15 mcg total) by nebulization 2 (two) times daily. 8 mL 0  . ARIPiprazole (ABILIFY) 5 MG tablet Take 1 tablet (5 mg total) by mouth at bedtime.    . Armodafinil 150 MG tablet     . aspirin 81 MG EC tablet TAKE 1 TABLET BY MOUTH EVERY DAY 30 tablet 5  . atorvastatin (LIPITOR) 20 MG tablet TAKE 1 TABLET EVERY DAY 90 tablet 3  . clonazePAM (KLONOPIN) 1 MG tablet Take 2 mg by mouth at bedtime as needed for anxiety.     . clopidogrel (PLAVIX) 75 MG tablet Take 1 tablet (75 mg total) by mouth daily. 90 tablet 3  . Diphenhyd-Hydrocort-Nystatin (FIRST-DUKES MOUTHWASH) SUSP Use as directed 5 mLs in the mouth or throat 4 (four) times daily as needed. 237 mL 2  . esomeprazole (NEXIUM) 40 MG capsule Take 40 mg by mouth every morning.     . finasteride (PROSCAR) 5 MG tablet Take 5 mg by mouth daily.     [name] FLUoxetine (PROZAC) 40 MG capsule Take 40 mg by mouth every morning.    . Fluticasone-Salmeterol  (ADVAIR DISKUS) 500-50 MCG/DOSE AEPB 1 puff then rinse mouth, twice daily maintenance 60 each 11  . gabapentin (NEURONTIN) 100 MG capsule Take 100 mg by mouth daily.     . isosorbide mononitrate (IMDUR) 60 MG 24 hr tablet TAKE 1 TABLET (60 MG TOTAL) BY MOUTH DAILY. 90 tablet 3  . methocarbamol (ROBAXIN) 500 MG tablet Take 500 mg by mouth 2 (two) times daily as needed for muscle spasms.     . metoprolol succinate (TOPROL-XL) 25 MG 24 hr tablet TAKE 1/2 TABLET BY MOUTH DAILY 30 tablet 3  . mirtazapine (REMERON) 45 MG tablet Take 1 tablet (45 mg total) by mouth at bedtime.    . nitroGLYCERIN (NITROSTAT) 0.4 MG SL tablet Place 1 tablet (0.4 mg total) under the tongue every 5 (five) minutes as needed for chest pain. 25 tablet 3  . potassium chloride SA (K-DUR,KLOR-CON) 20 MEQ tablet Take 1 tablet (20 mEq total) by mouth every morning. Reported on 12/07/2015 90 tablet 1  . STIOLTO RESPIMAT 2.5-2.5 MCG/ACT AERS INHALE 2 PUFFS INTO THE LUNGS DAILY.  5  . tamsulosin (FLOMAX) 0.4 MG CAPS capsule Take 0.4 mg by mouth daily.  3  . theophylline (UNIPHYL) 400 MG 24 hr tablet Take 400 mg by mouth daily.  5  . valACYclovir (VALTREX) 1000 MG tablet Take 1 tablet (1,000 mg  total) by mouth 2 (two) times daily. 20 tablet 2  . VENTOLIN HFA 108 (90 BASE) MCG/ACT inhaler INHALE 2 PUFFS INTO THE LUNGS 4 TIMES DAILY AS NEEDED FOR WHEEZING 18 Inhaler 11  . VOLTAREN 1 % GEL Apply 2 g topically daily as needed (for pain).     [name] morphine (MS CONTIN) 30 MG 12 hr tablet Take 1 tablet (30 mg total) by mouth every 12 (twelve) hours. 60 tablet 0  . morphine (MSIR) 15 MG tablet Take 1 tablet (15 mg total) by mouth every 4 (four) hours as needed for severe pain. 30 tablet 0  . chlorpheniramine-HYDROcodone (TUSSIONEX PENNKINETIC ER) 10-8 MG/5ML SUER 1 tsp ( 5 ml) every 12 hours if needed for cough 200 mL 0   Facility-Administered Medications Prior to Visit  Medication Dose Route Frequency Provider Last Rate Last Dose  . DOBUTamine  (DOBUTREX) 1,000 mcg/mL in dextrose 5% 250 mL infusion  0-40 mcg/kg/min Intravenous Continuous Fay Records, MD 224.2 mL/hr at 06/12/15 1117 40 mcg/kg/min at 06/12/15 1117    ROS Review of Systems  Constitutional: Negative.  Negative for fever, chills, diaphoresis, activity change, appetite change, fatigue and unexpected weight change.  HENT: Negative.  Negative for trouble swallowing and voice change.   Eyes: Negative.   Respiratory: Positive for shortness of breath. Negative for apnea, cough, choking, chest tightness, wheezing and stridor.   Cardiovascular: Positive for chest pain. Negative for palpitations and leg swelling.  Gastrointestinal: Negative.  Negative for nausea, vomiting, abdominal pain, diarrhea, constipation and blood in stool.  Endocrine: Negative.   Genitourinary: Negative.   Musculoskeletal: Positive for neck pain.  Skin: Negative.  Negative for color change and rash.  Allergic/Immunologic: Negative.   Neurological: Positive for dizziness and light-headedness.  Hematological: Negative.  Negative for adenopathy. Does not bruise/bleed easily.  Psychiatric/Behavioral: Negative.     Objective:  BP 130/86 mmHg  Pulse 66  Temp(Src) 98.2 F (36.8 C) (Oral)  Resp 16  Ht '5\' 6"'$  (1.676 m)  Wt 209 lb (94.802 kg)  BMI 33.75 kg/m2  SpO2 92%  BP Readings from Last 3 Encounters:  01/08/16 130/86  12/14/15 118/74  12/12/15 126/70    Wt Readings from Last 3 Encounters:  01/08/16 209 lb (94.802 kg)  12/14/15 205 lb 9.6 oz (93.26 kg)  12/12/15 204 lb (92.534 kg)    Physical Exam  Constitutional: He is oriented to person, place, and time. No distress.  HENT:  Head: Normocephalic and atraumatic.  Mouth/Throat: Oropharynx is clear and moist. No oropharyngeal exudate.  Eyes: Conjunctivae are normal. Right eye exhibits no discharge. Left eye exhibits no discharge. No scleral icterus.  Neck: Normal range of motion. Neck supple. No JVD present. No tracheal deviation  present. No thyromegaly present.  Cardiovascular: Normal rate, regular rhythm, S1 normal, S2 normal, normal heart sounds and intact distal pulses.  Exam reveals no gallop and no friction rub.   No murmur heard. EKG-  Sinus  Rhythm  - occasional PAC    # PACs = 1. -RSR(V1) -nondiagnostic.   PROBABLY NORMAL- artifact is noted due to technical difficulties with our EKG machine, did not see any Q waves or acute ST or T-wave changes.   Pulmonary/Chest: Effort normal and breath sounds normal. No stridor. No respiratory distress. He has no wheezes. He has no rales. He exhibits no tenderness.  Abdominal: Soft. Bowel sounds are normal. He exhibits no distension and no mass. There is no tenderness. There is no rebound and no guarding.  Musculoskeletal:  Normal range of motion. He exhibits no edema or tenderness.  Lymphadenopathy:    He has no cervical adenopathy.  Neurological: He is oriented to person, place, and time.  Skin: Skin is warm. No rash noted. He is not diaphoretic. No erythema. No pallor.  Vitals reviewed.   Lab Results  Component Value Date   WBC 9.1 11/28/2015   HGB 12.6* 11/28/2015   HCT 40.0 11/28/2015   PLT 98* 11/28/2015   GLUCOSE 177* 11/28/2015   CHOL 164 10/05/2015   TRIG 174.0* 10/05/2015   HDL 48.70 10/05/2015   LDLDIRECT 71.4 09/08/2012   LDLCALC 80 10/05/2015   ALT 37 11/25/2015   AST 33 11/25/2015   NA 141 11/28/2015   K 4.5 11/28/2015   CL 97* 11/28/2015   CREATININE 0.96 11/28/2015   BUN 12 11/28/2015   CO2 36* 11/28/2015   TSH 2.353 08/07/2015   PSA 0.64 07/18/2011   INR 0.91 08/07/2015   HGBA1C 6.8* 01/08/2016   MICROALBUR 5.7* 10/05/2015    Dg Chest Port 1 View  11/25/2015  CLINICAL DATA:  Pneumonia.  Shortness of breath. EXAM: PORTABLE CHEST 1 VIEW COMPARISON:  11/24/2015, 10/05/2015 and 07/14/2015 FINDINGS: There is chronic scarring at the left apex with the superior retraction of the left hilum. Slight haziness at both lung bases on the prior  study has cleared. This may be an overlying soft tissues or minimal pulmonary edema. Heart size and vascularity are normal. No acute osseous abnormality. IMPRESSION: No acute abnormality.  Scarring at the left apex. Electronically Signed   By: Lorriane Shire M.D.   On: 11/25/2015 07:27   Dg Chest Port 1 View  11/24/2015  CLINICAL DATA:  60 year old male with shortness of breath EXAM: PORTABLE CHEST 1 VIEW COMPARISON:  Radiograph dated 10/05/2015 FINDINGS: Single-view of the chest demonstrate stable area of opacity at the left attacks, likely atelectasis/ scarring. Bibasilar atelectatic changes noted. There is no other focal consolidation, pleural effusion, or pneumothorax. Stable cardiac silhouette. Cervical fixation plate and screws noted. IMPRESSION: Left apical opacity, compatible with known scarring. No acute cardiopulmonary process. Electronically Signed   By: Anner Crete M.D.   On: 11/24/2015 02:02    Assessment & Plan:   Darron was seen today for chest pain.  Diagnoses and all orders for this visit:  Chronic pain syndrome -     morphine (MS CONTIN) 30 MG 12 hr tablet; Take 1 tablet (30 mg total) by mouth every 12 (twelve) hours. -     morphine (MSIR) 15 MG tablet; Take 1 tablet (15 mg total) by mouth every 4 (four) hours as needed for severe pain.  Precordial chest pain- he has had a low risk nuclear stress test within the last year, there are some typical and atypical features in his chest pain, his CK-MB is slightly elevated but the troponin is negative, EKG is nondiagnostic, I've asked him to follow-up with cardiology for further evaluation but I do not see any indication for urgent intervention at this time. -     EKG 12-Lead -     Ambulatory referral to Cardiology -     Cardiac panel; Future -     Troponin I; Future  Coronary artery disease involving native coronary artery of native heart without angina pectoris- as noted above, he will follow-up with cardiology on an urgent  basis. -     Ambulatory referral to Cardiology -     Cardiac panel; Future -     Troponin I; Future  Gastroesophageal reflux disease with esophagitis  Essential hypertension- his blood pressure is adequately well controlled  Diabetes mellitus without complication (HCC) - his blood sugars are adequately well controlled -     Hemoglobin A1c; Future  Other orders -     Cancel: morphine (MS CONTIN) 30 MG 12 hr tablet; Take 1 tablet (30 mg total) by mouth every 12 (twelve) hours. -     Cancel: morphine (MSIR) 15 MG tablet; Take 1 tablet (15 mg total) by mouth every 4 (four) hours as needed for severe pain. -     Cancel: esomeprazole (NEXIUM) 40 MG capsule; Take 1 capsule (40 mg total) by mouth every morning. -     Cancel: esomeprazole (NEXIUM) 40 MG capsule; Take 1 capsule (40 mg total) by mouth every morning.   I have discontinued Mr. Foulk's chlorpheniramine-HYDROcodone. I am also having him maintain his clonazePAM, VOLTAREN, ARIPiprazole, methocarbamol, esomeprazole, nitroGLYCERIN, mirtazapine, Fluticasone-Salmeterol, albuterol, VENTOLIN HFA, FLUoxetine, gabapentin, metoprolol succinate, atorvastatin, clopidogrel, STIOLTO RESPIMAT, arformoterol, finasteride, theophylline, tamsulosin, isosorbide mononitrate, Armodafinil, potassium chloride SA, valACYclovir, FIRST-DUKES MOUTHWASH, aspirin, morphine, and morphine.  Meds ordered this encounter  Medications  . morphine (MS CONTIN) 30 MG 12 hr tablet    Sig: Take 1 tablet (30 mg total) by mouth every 12 (twelve) hours.    Dispense:  60 tablet    Refill:  0  . morphine (MSIR) 15 MG tablet    Sig: Take 1 tablet (15 mg total) by mouth every 4 (four) hours as needed for severe pain.    Dispense:  30 tablet    Refill:  0     Follow-up: Return in about 3 weeks (around 01/29/2016).  [name], MD

## 2016-01-09 ENCOUNTER — Encounter: Payer: Self-pay | Admitting: Internal Medicine

## 2016-01-11 ENCOUNTER — Other Ambulatory Visit (HOSPITAL_BASED_OUTPATIENT_CLINIC_OR_DEPARTMENT_OTHER): Payer: Commercial Managed Care - HMO

## 2016-01-11 DIAGNOSIS — C3492 Malignant neoplasm of unspecified part of left bronchus or lung: Secondary | ICD-10-CM

## 2016-01-11 LAB — COMPREHENSIVE METABOLIC PANEL
ALBUMIN: 3.5 g/dL (ref 3.5–5.0)
ALK PHOS: 120 U/L (ref 40–150)
ALT: 21 U/L (ref 0–55)
AST: 19 U/L (ref 5–34)
Anion Gap: 9 mEq/L (ref 3–11)
BILIRUBIN TOTAL: 0.66 mg/dL (ref 0.20–1.20)
BUN: 9.2 mg/dL (ref 7.0–26.0)
CO2: 31 meq/L — AB (ref 22–29)
CREATININE: 1 mg/dL (ref 0.7–1.3)
Calcium: 9.1 mg/dL (ref 8.4–10.4)
Chloride: 99 mEq/L (ref 98–109)
EGFR: 83 mL/min/{1.73_m2} — ABNORMAL LOW (ref >90)
GLUCOSE: 154 mg/dL — AB (ref 70–140)
Potassium: 3.7 mEq/L (ref 3.5–5.1)
SODIUM: 140 meq/L (ref 136–145)
TOTAL PROTEIN: 6.5 g/dL (ref 6.4–8.3)

## 2016-01-11 LAB — CBC WITH DIFFERENTIAL/PLATELET
BASO%: 0.3 % (ref 0.0–2.0)
BASOS ABS: 0 10*3/uL (ref 0.0–0.1)
EOS ABS: 0.3 10*3/uL (ref 0.0–0.5)
EOS%: 2.7 % (ref 0.0–7.0)
HEMATOCRIT: 42.7 % (ref 38.4–49.9)
HGB: 13.8 g/dL (ref 13.0–17.1)
LYMPH%: 9.8 % — AB (ref 14.0–49.0)
MCH: 29.8 pg (ref 27.2–33.4)
MCHC: 32.3 g/dL (ref 32.0–36.0)
MCV: 92.2 fL (ref 79.3–98.0)
MONO#: 1 10*3/uL — AB (ref 0.1–0.9)
MONO%: 8.1 % (ref 0.0–14.0)
NEUT#: 9.3 10*3/uL — ABNORMAL HIGH (ref 1.5–6.5)
NEUT%: 79.1 % — ABNORMAL HIGH (ref 39.0–75.0)
NRBC: 0 % (ref 0–0)
PLATELETS: 120 10*3/uL — AB (ref 140–400)
RBC: 4.63 10*6/uL (ref 4.20–5.82)
RDW: 15 % — ABNORMAL HIGH (ref 11.0–14.6)
WBC: 11.8 10*3/uL — ABNORMAL HIGH (ref 4.0–10.3)
lymph#: 1.2 10*3/uL (ref 0.9–3.3)

## 2016-01-11 NOTE — Progress Notes (Signed)
HPI The patient presents for follow of known coronary disease.  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  He had a CT yesterday and is waiting to see his oncologist on Monday. He was in the hospital on 12:30 and I reviewed these records. He had acute on chronic lung disease. He's been on steroid taper since then but is chronically still on steroids. He has had some chest discomfort. This has been somewhat atypical. He describes some transient discomfort. It comes on at rest. He can't bring it on. It is moderate in intensity. It lasts for a couple of minutes. There is no radiation. It comes and goes spontaneously. He's not sure whether it feels like previous angina. He does not have palpitations, presyncope or syncope. He does have continued shortness of breath. He had enzymes done recently by his primary provider and his troponin was normal. He had a normal total CK but his MB ratio was slightly elevated.   Allergies  Allergen Reactions  . Carboplatin Shortness Of Breath, Swelling and Rash    Swelling of lips, rash on face,eyes and head    Current Outpatient Prescriptions  Medication Sig Dispense Refill  . albuterol (PROVENTIL) (2.5 MG/3ML) 0.083% nebulizer solution INHALE 1 VIAL IN NEBULIZER EVERY 4 HOURS AS NEEDED FOR WHEEZING OR SHORTNESS OF BREATH 300 mL 11  . arformoterol (BROVANA) 15 MCG/2ML NEBU Take 2 mLs (15 mcg total) by nebulization 2 (two) times daily. 8 mL 0  . ARIPiprazole (ABILIFY) 5 MG tablet Take 1 tablet (5 mg total) by mouth at bedtime.    . Armodafinil 150 MG tablet     . aspirin 81 MG EC tablet TAKE 1 TABLET BY MOUTH EVERY DAY 30 tablet 5  . atorvastatin (LIPITOR) 20 MG tablet TAKE 1 TABLET EVERY DAY 90 tablet 3  . BELSOMRA 20 MG TABS Take 1 tablet by mouth at bedtime. Take 1 tab by mouth daily at bed time for sleep prn  4  . clonazePAM (KLONOPIN) 1 MG tablet Take 2 mg by mouth at bedtime  as needed for anxiety.     . clopidogrel (PLAVIX) 75 MG tablet Take 1 tablet (75 mg total) by mouth daily. 90 tablet 3  . esomeprazole (NEXIUM) 40 MG capsule Take 40 mg by mouth every morning.     . finasteride (PROSCAR) 5 MG tablet Take 5 mg by mouth daily.     [name] FLUoxetine (PROZAC) 40 MG capsule Take 40 mg by mouth every morning.    . Fluticasone-Salmeterol (ADVAIR DISKUS) 500-50 MCG/DOSE AEPB 1 puff then rinse mouth, twice daily maintenance 60 each 11  . gabapentin (NEURONTIN) 100 MG capsule Take 100 mg by mouth daily.     . isosorbide mononitrate (IMDUR) 60 MG 24 hr tablet TAKE 1 TABLET (60 MG TOTAL) BY MOUTH DAILY. 90 tablet 3  . methocarbamol (ROBAXIN) 500 MG tablet Take 500 mg by mouth 2 (two) times daily as needed for muscle spasms.     . metoprolol succinate (TOPROL-XL) 25 MG 24 hr tablet TAKE 1/2 TABLET BY MOUTH DAILY 30 tablet 3  . mirtazapine (REMERON) 45 MG tablet Take 1 tablet (45 mg total) by mouth at bedtime.    [name] morphine (MS CONTIN) 30 MG 12 hr tablet Take 1 tablet (30 mg total) by mouth every 12 (twelve) hours. 60 tablet 0  . morphine (MSIR) 15 MG tablet Take 1 tablet (15 mg total)  by mouth every 4 (four) hours as needed for severe pain. 30 tablet 0  . nitroGLYCERIN (NITROSTAT) 0.4 MG SL tablet Place 1 tablet (0.4 mg total) under the tongue every 5 (five) minutes as needed for chest pain. 25 tablet 3  . potassium chloride SA (K-DUR,KLOR-CON) 20 MEQ tablet Take 1 tablet (20 mEq total) by mouth every morning. Reported on 12/07/2015 90 tablet 1  . STIOLTO RESPIMAT 2.5-2.5 MCG/ACT AERS INHALE 2 PUFFS INTO THE LUNGS DAILY.  5  . theophylline (UNIPHYL) 400 MG 24 hr tablet Take 400 mg by mouth daily.  5  . VENTOLIN HFA 108 (90 BASE) MCG/ACT inhaler INHALE 2 PUFFS INTO THE LUNGS 4 TIMES DAILY AS NEEDED FOR WHEEZING 18 Inhaler 11   No current facility-administered medications for this visit.   Facility-Administered Medications Ordered in Other Visits  Medication Dose Route Frequency  Provider Last Rate Last Dose  . DOBUTamine (DOBUTREX) 1,000 mcg/mL in dextrose 5% 250 mL infusion  0-40 mcg/kg/min Intravenous Continuous Fay Records, MD 224.2 mL/hr at 06/12/15 1117 40 mcg/kg/min at 06/12/15 1117    Past Medical History  Diagnosis Date  . CAD (coronary artery disease)     Left Main 30% stenosis, LAD 20 - 30 % stenosis, first and second diagonal branchesat 40 - 50%  stenosis with small arteries, circumflex had 30% stenosis in the large obtuse marginal, RCA at 70 - 80%  stenosis [not felt to be occlusive after evaluation with flow wire], distal 50 - 60% stenosis - Justiss Gerbino[  . COPD (chronic obstructive pulmonary disease) (HCC)     Dr. Baird Lyons  . Depression   . Anxiety   . Hyperlipidemia   . Chronic insomnia   . Gout   . GERD (gastroesophageal reflux disease)   . PVD (peripheral vascular disease) (HCC)     PTA/Stent right common iliac  . DDD (degenerative disc disease)   . Hx of colonoscopy   . 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 OSA (obstructive sleep apnea)     NPSG 09/10/10- AHI 11.3/hr  . Hypertension     dr Percival Spanish  . History of radiation therapy 11/10/13- 12/29/13    left lung 6600 cGy in 33 sessions  . Diabetes mellitus without complication ([place]) 1/61/0960  . Cancer ([place])     lung  . Lung cancer ([place]) 10/04/13    LUL squamous cell lung cancer  . History of cardiovascular stress test     Myoview 7/16:  Diaphragmatic attenuation, no ischemia, EF 56%; Low Risk    Past Surgical History  Procedure Laterality Date  . Spinal fusion  03/05/2007    L4-L5  . Hip surgery      left 'bone graft taken"  . Arm surgery      left elbow  . Shoulder surgery      right  . C-spine surgery    . Angioplasty    . Video bronchoscopy with endobronchial navigation N/A 10/04/2013    Procedure: VIDEO BRONCHOSCOPY WITH ENDOBRONCHIAL NAVIGATION;  Surgeon: Collene Gobble, MD;  Location: [facility];  Service: Thoracic;  Laterality: N/A;  . Back surgery      lower  .  Anterior fusion cervical spine      cervical fusion  7 yrs ago ([facility])  . Colon surgery      '11 "Diverticulitis"  . Colonoscopy with propofol N/A 05/22/2015    Procedure: COLONOSCOPY WITH PROPOFOL;  Surgeon: Inda Castle, MD;  Location: [facility];  Service: Endoscopy;  Laterality: N/A;  . Peripheral vascular catheterization N/A 08/10/2015    Procedure: Lower Extremity Angiography;  Surgeon: Lorretta Harp, MD; Distal Ao OK, L-EIA stent OK, R-CIA 100% s/p overlapping 7 mm x 38 mm ICast stents, 50-60% R-CFA        ROS:    As stated in the HPI and negative for all other systems.  PHYSICAL EXAM BP 120/70 mmHg  Pulse 78  Ht '5\' 6"'$  (1.676 m)  Wt 206 lb 5 oz (93.583 kg)  BMI 33.32 kg/m2 GENERAL:  Well appearing HEENT:  Pupils equal round and reactive, fundi not visualized, oral mucosa unremarkable, dentures NECK:  No jugular venous distention, waveform within normal limits, carotid upstroke brisk and symmetric, no bruits, no thyromegaly LYMPHATICS:  No cervical, inguinal adenopathy LUNGS:  Decreased breath sounds bilaterally with expiratory wheezing BACK:  No CVA tenderness CHEST:  Unremarkable HEART:  PMI not displaced or sustained,S1 and S2 within normal limits, no S3, no S4, no clicks, no rubs, no murmurs ABD:  Flat, positive bowel sounds normal in frequency in pitch, no bruits, no rebound, no guarding, no midline pulsatile mass, no hepatomegaly, no splenomegaly EXT:  2 plus pulses upper, DP/PT left 2 plus,  2+ right posterior tibialis ,  no edema, no cyanosis no clubbing SKIN:  No rashes no nodules  EKG:  Sinus rhythm, rate 78, axis within normal limits, intervals within normal limits, no acute ST-T wave changes.  01/12/2016    ASSESSMENT AND PLAN  CLAUDICATION - He is much better since percutaneous revascularization last year. He'll continue with risk reduction.  CORONARY ATHEROSCLEROSIS NATIVE CORONARY ARTERY -  He had a stress test last year that was not high risk. He does  have some chest discomfort. I'm going to manage this medically increase his Imdur to 120 mg daily.  HYPERTENSION -  His blood pressure is controlled and he will continue the meds as listed.  HYPERCHOLESTEROLEMIA -  His lipids are okay. He'll continue the meds as listed.  Lab Results  Component Value Date   CHOL 164 10/05/2015   TRIG 174.0* 10/05/2015   HDL 48.70 10/05/2015   LDLCALC 80 10/05/2015   LDLDIRECT 71.4 09/08/2012   TOBACCO - He is still not smoking.

## 2016-01-12 ENCOUNTER — Ambulatory Visit (HOSPITAL_COMMUNITY)
Admission: RE | Admit: 2016-01-12 | Discharge: 2016-01-12 | Disposition: A | Discharge status: Home / Self Care | Payer: Commercial Managed Care - HMO | Source: Ambulatory Visit | Attending: Internal Medicine | Admitting: Internal Medicine

## 2016-01-12 ENCOUNTER — Encounter (HOSPITAL_COMMUNITY): Payer: Self-pay

## 2016-01-12 ENCOUNTER — Ambulatory Visit (INDEPENDENT_AMBULATORY_CARE_PROVIDER_SITE_OTHER): Payer: Commercial Managed Care - HMO | Admitting: Cardiology

## 2016-01-12 ENCOUNTER — Encounter: Payer: Self-pay | Admitting: Cardiology

## 2016-01-12 VITALS — BP 120/70 | HR 78 | Ht 66.0 in | Wt 206.3 lb

## 2016-01-12 DIAGNOSIS — Z9221 Personal history of antineoplastic chemotherapy: Secondary | ICD-10-CM | POA: Diagnosis not present

## 2016-01-12 DIAGNOSIS — C3492 Malignant neoplasm of unspecified part of left bronchus or lung: Secondary | ICD-10-CM | POA: Diagnosis not present

## 2016-01-12 DIAGNOSIS — R590 Localized enlarged lymph nodes: Secondary | ICD-10-CM | POA: Insufficient documentation

## 2016-01-12 DIAGNOSIS — I2510 Atherosclerotic heart disease of native coronary artery without angina pectoris: Secondary | ICD-10-CM

## 2016-01-12 DIAGNOSIS — R911 Solitary pulmonary nodule: Secondary | ICD-10-CM | POA: Diagnosis not present

## 2016-01-12 DIAGNOSIS — Z85118 Personal history of other malignant neoplasm of bronchus and lung: Secondary | ICD-10-CM | POA: Diagnosis not present

## 2016-01-12 MED ORDER — IOHEXOL 300 MG/ML  SOLN
75.0000 mL | Freq: Once | INTRAMUSCULAR | Status: AC | PRN
  Administered 2016-01-12: 75 mL via INTRAVENOUS

## 2016-01-12 MED ORDER — ISOSORBIDE MONONITRATE ER 120 MG PO TB24: ORAL_TABLET | ORAL | Status: DC

## 2016-01-12 NOTE — Patient Instructions (Signed)
Medication Instructions:   INCREASE ISOSORBIDE TO 120 MG ONCE DAILY= 2 OF THE 60 MG TABLETS ONCE DAILY  Follow-Up:  Your physician recommends that you schedule a follow-up appointment in: [name]

## 2016-01-15 ENCOUNTER — Ambulatory Visit (HOSPITAL_BASED_OUTPATIENT_CLINIC_OR_DEPARTMENT_OTHER): Payer: Commercial Managed Care - HMO | Admitting: Internal Medicine

## 2016-01-15 ENCOUNTER — Ambulatory Visit: Payer: Medicare HMO | Admitting: Cardiology

## 2016-01-15 ENCOUNTER — Encounter: Payer: Self-pay | Admitting: Internal Medicine

## 2016-01-15 ENCOUNTER — Telehealth: Payer: Self-pay | Admitting: Internal Medicine

## 2016-01-15 VITALS — BP 156/64 | HR 62 | Temp 98.4°F | Resp 18 | Ht 66.0 in | Wt 208.3 lb

## 2016-01-15 DIAGNOSIS — C3492 Malignant neoplasm of unspecified part of left bronchus or lung: Secondary | ICD-10-CM

## 2016-01-15 NOTE — Progress Notes (Signed)
[facility] [phone]   Fax:([phone]  OFFICE PROGRESS NOTE  [name], MD 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  PRIOR THERAPY:  1) Concurrent chemoradiation with weekly carboplatin for AUC of 2 and paclitaxel 45 mg/M2, status post 7 cycles, last dose was given 12/20/2013. First dose on 11/01/2013. 2) Consolidation chemotherapy with carboplatin for AUC of 5 and paclitaxel 175 mg/M2 every 3 weeks with Neulasta support. First cycle on 02/08/2014. Status post 3 cycles and carboplatin was discontinued secondary to allergic reaction.    CURRENT THERAPY: Observation  CHEMOTHERAPY INTENT: Curative/control  CURRENT # OF CHEMOTHERAPY CYCLES: 0  CURRENT ANTIEMETICS: Zofran, dexamethasone and Compazine  CURRENT SMOKING STATUS: Former smoker  ORAL CHEMOTHERAPY AND CONSENT: None  CURRENT BISPHOSPHONATES USE: None  PAIN MANAGEMENT: 0/10  NARCOTICS INDUCED CONSTIPATION: None.  LIVING WILL AND CODE STATUS: Full code.   INTERVAL HISTORY: Ernest [name] 60 y.o. male returns to the clinic today for four-month followup visit. The patient is feeling fine today with no specific complaints except for shortness of breath with exertion secondary to COPD. He is followed by Dr. Annamaria Boots. He denied having any significant chest pain or hemoptysis. He denied having any nausea or vomiting, no fever or chills. The patient had repeat CT scan of the chest performed recently and he is here for evaluation and discussion of his scan results.  MEDICAL HISTORY: Past Medical History  Diagnosis Date  . CAD (coronary artery disease)     Left Main 30% stenosis, LAD 20 - 30 % stenosis, first and second diagonal branchesat 40 - 50%  stenosis with small  arteries, circumflex had 30% stenosis in the large obtuse marginal, RCA at 70 - 80%  stenosis [not felt to be occlusive after evaluation with flow wire], distal 50 - 60% stenosis - [name][  . COPD (chronic obstructive pulmonary disease) (HCC)     Dr. Baird Lyons  . Depression   . Anxiety   . Hyperlipidemia   . Chronic insomnia   . Gout   . GERD (gastroesophageal reflux disease)   . PVD (peripheral vascular disease) (HCC)     PTA/Stent right common iliac  . DDD (degenerative disc disease)   . Hx of colonoscopy   . COPD with asthma ([place]) 09/08/2007  . OSA (obstructive sleep apnea)     NPSG 09/10/10- AHI 11.3/hr  . Hypertension     dr Percival Spanish  . History of radiation therapy 11/10/13- 12/29/13    left lung 6600 cGy in 33 sessions  . Diabetes mellitus without complication ([place]) 4/98/2641  . Cancer ([place])     lung  . Lung cancer ([place]) 10/04/13    LUL squamous cell lung cancer  . History of cardiovascular stress test     Myoview 7/16:  Diaphragmatic attenuation, no ischemia, EF 56%; Low Risk    ALLERGIES:  is allergic to carboplatin.  MEDICATIONS:  Current Outpatient Prescriptions  Medication Sig Dispense Refill  . albuterol (PROVENTIL) (2.5 MG/3ML) 0.083% nebulizer solution INHALE 1 VIAL IN NEBULIZER EVERY 4 HOURS AS NEEDED FOR WHEEZING OR SHORTNESS OF BREATH 300 mL 11  . arformoterol (BROVANA) 15 MCG/2ML NEBU Take 2 mLs (15 mcg total) by nebulization 2 (two) times daily. 8 mL 0  . ARIPiprazole (ABILIFY) 5 MG tablet Take 1  tablet (5 mg total) by mouth at bedtime.    . Armodafinil 150 MG tablet     . aspirin 81 MG EC tablet TAKE 1 TABLET BY MOUTH EVERY DAY 30 tablet 5  . atorvastatin (LIPITOR) 20 MG tablet TAKE 1 TABLET EVERY DAY 90 tablet 3  . BELSOMRA 20 MG TABS Take 1 tablet by mouth at bedtime. Take 1 tab by mouth daily at bed time for sleep prn  4  . clonazePAM (KLONOPIN) 1 MG tablet Take 2 mg by mouth at bedtime as needed for anxiety.     . clopidogrel (PLAVIX) 75 MG  tablet Take 1 tablet (75 mg total) by mouth daily. 90 tablet 3  . esomeprazole (NEXIUM) 40 MG capsule Take 40 mg by mouth every morning.     . finasteride (PROSCAR) 5 MG tablet Take 5 mg by mouth daily.     [name] FLUoxetine (PROZAC) 40 MG capsule Take 40 mg by mouth every morning.    . Fluticasone-Salmeterol (ADVAIR DISKUS) 500-50 MCG/DOSE AEPB 1 puff then rinse mouth, twice daily maintenance 60 each 11  . gabapentin (NEURONTIN) 100 MG capsule Take 100 mg by mouth daily.     . isosorbide mononitrate (IMDUR) 120 MG 24 hr tablet TAKE 1 TABLET (60 MG TOTAL) BY MOUTH DAILY. 90 tablet 3  . methocarbamol (ROBAXIN) 500 MG tablet Take 500 mg by mouth 2 (two) times daily as needed for muscle spasms.     . metoprolol succinate (TOPROL-XL) 25 MG 24 hr tablet TAKE 1/2 TABLET BY MOUTH DAILY 30 tablet 3  . mirtazapine (REMERON) 45 MG tablet Take 1 tablet (45 mg total) by mouth at bedtime.    [name] morphine (MS CONTIN) 30 MG 12 hr tablet Take 1 tablet (30 mg total) by mouth every 12 (twelve) hours. 60 tablet 0  . morphine (MSIR) 15 MG tablet Take 1 tablet (15 mg total) by mouth every 4 (four) hours as needed for severe pain. 30 tablet 0  . nitroGLYCERIN (NITROSTAT) 0.4 MG SL tablet Place 1 tablet (0.4 mg total) under the tongue every 5 (five) minutes as needed for chest pain. 25 tablet 3  . potassium chloride SA (K-DUR,KLOR-CON) 20 MEQ tablet Take 1 tablet (20 mEq total) by mouth every morning. Reported on 12/07/2015 90 tablet 1  . STIOLTO RESPIMAT 2.5-2.5 MCG/ACT AERS INHALE 2 PUFFS INTO THE LUNGS DAILY.  5  . theophylline (UNIPHYL) 400 MG 24 hr tablet Take 400 mg by mouth daily.  5  . VENTOLIN HFA 108 (90 BASE) MCG/ACT inhaler INHALE 2 PUFFS INTO THE LUNGS 4 TIMES DAILY AS NEEDED FOR WHEEZING 18 Inhaler 11   No current facility-administered medications for this visit.   Facility-Administered Medications Ordered in Other Visits  Medication Dose Route Frequency Provider Last Rate Last Dose  . DOBUTamine (DOBUTREX)  1,000 mcg/mL in dextrose 5% 250 mL infusion  0-40 mcg/kg/min Intravenous Continuous Fay Records, MD 224.2 mL/hr at 06/12/15 1117 40 mcg/kg/min at 06/12/15 1117    SURGICAL HISTORY:  Past Surgical History  Procedure Laterality Date  . Spinal fusion  03/05/2007    L4-L5  . Hip surgery      left 'bone graft taken"  . Arm surgery      left elbow  . Shoulder surgery      right  . C-spine surgery    . Angioplasty    . Video bronchoscopy with endobronchial navigation N/A 10/04/2013    Procedure: VIDEO BRONCHOSCOPY WITH ENDOBRONCHIAL NAVIGATION;  Surgeon: Rose Fillers  Lamonte Sakai, MD;  Location: [facility];  Service: Thoracic;  Laterality: N/A;  . Back surgery      lower  . Anterior fusion cervical spine      cervical fusion  7 yrs ago ([facility])  . Colon surgery      '11 "Diverticulitis"  . Colonoscopy with propofol N/A 05/22/2015    Procedure: COLONOSCOPY WITH PROPOFOL;  Surgeon: Inda Castle, MD;  Location: [facility];  Service: Endoscopy;  Laterality: N/A;  . Peripheral vascular catheterization N/A 08/10/2015    Procedure: Lower Extremity Angiography;  Surgeon: Lorretta Harp, MD; Distal Ao OK, L-EIA stent OK, R-CIA 100% s/p overlapping 7 mm x 38 mm ICast stents, 50-60% R-CFA        REVIEW OF SYSTEMS:  A comprehensive review of systems was negative except for: Respiratory: positive for dyspnea on exertion   PHYSICAL EXAMINATION: General appearance: alert, cooperative and no distress Head: Normocephalic, without obvious abnormality, atraumatic Neck: no adenopathy, no JVD, supple, symmetrical, trachea midline and thyroid not enlarged, symmetric, no tenderness/mass/nodules Lymph nodes: Cervical, supraclavicular, and axillary nodes normal. Resp: wheezes bilaterally Back: symmetric, no curvature. ROM normal. No CVA tenderness. Cardio: regular rate and rhythm, S1, S2 normal, no murmur, click, rub or gallop GI: soft, non-tender; bowel sounds normal; no masses,  no organomegaly Extremities: extremities  normal, atraumatic, no cyanosis or edema Neurologic: Alert and oriented X 3, normal strength and tone. Normal symmetric reflexes. Normal coordination and gait  ECOG PERFORMANCE STATUS: 1 - Symptomatic but completely ambulatory  Blood pressure 156/64, pulse 62, temperature 98.4 F (36.9 C), temperature source Oral, resp. rate 18, height '5\' 6"'$  (1.676 m), weight 208 lb 4.8 oz (94.484 kg), SpO2 94 %.  LABORATORY DATA: Lab Results  Component Value Date   WBC 11.8* 01/11/2016   HGB 13.8 01/11/2016   HCT 42.7 01/11/2016   MCV 92.2 01/11/2016   PLT 120* 01/11/2016      Chemistry      Component Value Date/Time   NA 140 01/11/2016 0954   NA 141 11/28/2015 0557   K 3.7 01/11/2016 0954   K 4.5 11/28/2015 0557   CL 97* 11/28/2015 0557   CO2 31* 01/11/2016 0954   CO2 36* 11/28/2015 0557   BUN 9.2 01/11/2016 0954   BUN 12 11/28/2015 0557   CREATININE 1.0 01/11/2016 0954   CREATININE 0.96 11/28/2015 0557   CREATININE 0.80 08/07/2015 0959      Component Value Date/Time   CALCIUM 9.1 01/11/2016 0954   CALCIUM 9.2 11/28/2015 0557   ALKPHOS 120 01/11/2016 0954   ALKPHOS 113 11/25/2015 0710   AST 19 01/11/2016 0954   AST 33 11/25/2015 0710   ALT 21 01/11/2016 0954   ALT 37 11/25/2015 0710   BILITOT 0.66 01/11/2016 0954   BILITOT 0.4 11/25/2015 0710       RADIOGRAPHIC STUDIES: Ct Chest W Contrast  01/12/2016  CLINICAL DATA:  Lung cancer diagnosed 2014 chemotherapy complete 2015. Subsequent treatment evaluation. EXAM: CT CHEST WITH CONTRAST TECHNIQUE: Multidetector CT imaging of the chest was performed during intravenous contrast administration. CONTRAST:  72m OMNIPAQUE IOHEXOL 300 MG/ML  SOLN COMPARISON:  CT 09/06/2015 FINDINGS: Mediastinum/Nodes: No axillary or supraclavicular lymphadenopathy. No mediastinal or hilar lymphadenopathy. Esophagus normal. There is a lymph node along the descending thoracic aorta measuring 7 mm (image 44, series 2) which is not changed from prior.  Lungs/Pleura: Consolidative pattern in the LEFT upper lobe extending from the hilum is not changed from prior. No LEFT pulmonary nodules. Within the  RIGHT lower lobe new 19 mm x 15 mm oblong nodule (image 36, series 5). This is at the same site of small 5 mm ill-defined nodule on comparison CT. Upper abdomen: All no focal hepatic lesion. This low-density lesion in the inferior aspect of the spleen which is not changed. Adrenal glands normal. Atherosclerotic calcification aorta. Musculoskeletal: No aggressive osseous lesion. IMPRESSION: 1. Significant enlargement of RIGHT lower lobe nodule is concerning for a pulmonary metastasis. Recommend tissue sampling with bronchoscopy or percutaneous biopsy. 2. Stable postsurgical and post therapy change in the LEFT upper lobe. 3. Stable small mediastinal lymph nodes. Electronically Signed   By: Suzy Bouchard M.D.   On: 01/12/2016 10:02    ASSESSMENT AND PLAN: This is a very pleasant 60 years old white male with stage IIIA non-small cell lung cancer, squamous cell carcinoma currently undergoing concurrent chemoradiation with weekly carboplatin and paclitaxel status post 7 weeks of treatment. He tolerated his treatment fairly well with no significant adverse effects. This was followed by consolidation chemotherapy with carboplatin and paclitaxel status post 3 cycles, .carboplatin was discontinued after cycle #2 secondary to hypersensitivity reaction. The recent CT scan of the chest progressive enlargement of the right lower lobe lung nodule concerning for pulmonary metastasis or new synchronous tumor. I discussed the scan results with the patient and showed them the images. I recommended for him to have a PET scan performed for further evaluation of this new pulmonary nodule as well as his previous disease. I will see him back for follow-up visit in 2 weeks for reevaluation and discussion of his PET scan results as well as treatment recommendation. For hypertension, I  recommended for the patient to continue follow-up with his primary care physician for adjustment of his antihypertensive medication.  He was advised to call immediately if he has any concerning symptoms in the interval. The patient voices understanding of current disease status and treatment options and is in agreement with the current care plan.  All questions were answered. The patient knows to call the clinic with any problems, questions or concerns. We can certainly see the patient much sooner if necessary.  Disclaimer: This note was dictated with voice recognition software. Similar sounding words can inadvertently be transcribed and may not be corrected upon review.

## 2016-01-15 NOTE — Telephone Encounter (Signed)
Pt confirmed appt and received avs °

## 2016-01-16 ENCOUNTER — Other Ambulatory Visit: Payer: Self-pay | Admitting: *Deleted

## 2016-01-17 ENCOUNTER — Other Ambulatory Visit: Payer: Self-pay | Admitting: Internal Medicine

## 2016-01-17 NOTE — Patient Outreach (Signed)
[facility]) Care Management   01/17/2016  Ernest [name] [birth year] [id number]  Ernest [name] is an 60 y.o. male  Subjective: Pt reports got some bad news yesterday- has cancer in right lung, to have PET scan 2/28, biopsy in 2 weeks.   Pt  reports cancer in left lung is in remission.   Pt reports for the last month has been under the weather. Pt reports  Appetite not good, having one ensure a day.   Objective:   Filed Vitals:   01/16/16 1041  BP: 146/78  Pulse: 68  Resp: 14    ROS  Physical Exam  Constitutional: He is oriented to person, place, and time. He appears well-developed.  Cardiovascular: Normal rate and regular rhythm.   Respiratory: Effort normal and breath sounds normal.  GI: Soft.  Musculoskeletal: Normal range of motion.  Neurological: He is alert and oriented to person, place, and time.  Skin: Skin is warm and dry.  Psychiatric: He has a normal mood and affect. His behavior is normal. Judgment and thought content normal.    Current Medications:  Reviewed with pt  Current Outpatient Prescriptions  Medication Sig Dispense Refill  . albuterol (PROVENTIL) (2.5 MG/3ML) 0.083% nebulizer solution INHALE 1 VIAL IN NEBULIZER EVERY 4 HOURS AS NEEDED FOR WHEEZING OR SHORTNESS OF BREATH 300 mL 2  . arformoterol (BROVANA) 15 MCG/2ML NEBU Take 2 mLs (15 mcg total) by nebulization 2 (two) times daily. 8 mL 0  . ARIPiprazole (ABILIFY) 5 MG tablet Take 1 tablet (5 mg total) by mouth at bedtime.    . Armodafinil 150 MG tablet     . aspirin 81 MG EC tablet TAKE 1 TABLET BY MOUTH EVERY DAY 30 tablet 5  . atorvastatin (LIPITOR) 20 MG tablet TAKE 1 TABLET EVERY DAY 90 tablet 3  . BELSOMRA 20 MG TABS Take 1 tablet by mouth at bedtime. Take 1 tab by mouth daily at bed time for sleep prn  4  . clopidogrel (PLAVIX) 75 MG tablet Take 1 tablet (75 mg total) by mouth daily. 90 tablet 3  . esomeprazole (NEXIUM) 40 MG capsule Take 40 mg by mouth every morning.      [name] FLUoxetine (PROZAC) 40 MG capsule Take 40 mg by mouth every morning.    . Fluticasone-Salmeterol (ADVAIR DISKUS) 500-50 MCG/DOSE AEPB 1 puff then rinse mouth, twice daily maintenance 60 each 11  . gabapentin (NEURONTIN) 100 MG capsule Take 100 mg by mouth daily.     . isosorbide mononitrate (IMDUR) 60 MG 24 hr tablet Take 60 mg by mouth daily.    . metoprolol succinate (TOPROL-XL) 25 MG 24 hr tablet TAKE 1/2 TABLET BY MOUTH DAILY 30 tablet 3  . mirtazapine (REMERON) 45 MG tablet Take 1 tablet (45 mg total) by mouth at bedtime.    [name] morphine (MS CONTIN) 30 MG 12 hr tablet Take 1 tablet (30 mg total) by mouth every 12 (twelve) hours. 60 tablet 0  . morphine (MSIR) 15 MG tablet Take 1 tablet (15 mg total) by mouth every 4 (four) hours as needed for severe pain. 30 tablet 0  . OXYGEN Inhale 2 L into the lungs at bedtime as needed.    . potassium chloride SA (K-DUR,KLOR-CON) 20 MEQ tablet Take 1 tablet (20 mEq total) by mouth every morning. Reported on 12/07/2015 90 tablet 1  . predniSONE (DELTASONE) 5 MG tablet Take 5 mg by mouth daily.    [name] STIOLTO RESPIMAT 2.5-2.5 MCG/ACT AERS INHALE 2  PUFFS INTO THE LUNGS DAILY.  5  . theophylline (UNIPHYL) 400 MG 24 hr tablet Take 400 mg by mouth daily.  5  . VENTOLIN HFA 108 (90 BASE) MCG/ACT inhaler INHALE 2 PUFFS INTO THE LUNGS 4 TIMES DAILY AS NEEDED FOR WHEEZING 18 Inhaler 11  . clonazePAM (KLONOPIN) 1 MG tablet Take 2 mg by mouth at bedtime as needed for anxiety. Reported on 01/16/2016    . methocarbamol (ROBAXIN) 500 MG tablet Take 500 mg by mouth 2 (two) times daily as needed for muscle spasms. Reported on 01/16/2016    . nitroGLYCERIN (NITROSTAT) 0.4 MG SL tablet Place 1 tablet (0.4 mg total) under the tongue every 5 (five) minutes as needed for chest pain. (Patient not taking: Reported on 01/15/2016) 25 tablet 3   No current facility-administered medications for this visit.   Facility-Administered Medications Ordered in Other Visits  Medication Dose  Route Frequency Provider Last Rate Last Dose  . DOBUTamine (DOBUTREX) 1,000 mcg/mL in dextrose 5% 250 mL infusion  0-40 mcg/kg/min Intravenous Continuous Fay Records, MD 224.2 mL/hr at 06/12/15 1117 40 mcg/kg/min at 06/12/15 1117    Functional Status:   In your present state of health, do you have any difficulty performing the following activities: 01/17/2016 12/07/2015  Hearing? N -  Vision? - -  Difficulty concentrating or making decisions? N -  Walking or climbing stairs? N -  Dressing or bathing? N -  Doing errands, shopping? N -  Conservation officer, nature and eating ? - N  Using the Toilet? - N  In the past six months, have you accidently leaked urine? - N  Do you have problems with loss of bowel control? - N  Managing your Medications? - N  Managing your Finances? - N  Housekeeping or managing your Housekeeping? - N    Fall/Depression Screening:    PHQ 2/9 Scores 12/07/2015 10/23/2015 10/16/2015  PHQ - 2 Score 1 1 1     Assessment:   Joint visit done with Ernest Osmond RN CM.                             COPD-   Pt very sob ambulating from answering the door (had O2 off short time).  Lungs clear.                                          New diagnosis of cancer in right lung.   Continues on 3 LNC                            Nutrition-  Reports appetite poor.                                Plan:  Pt to go for PET scan 2/28, biopsy  In 2 weeks                     Pt to increase amount of ensure taking, add protein.             As discussed with pt, plan to transition him to [place] nurse case management services.             Home visit scheduled with Ernest Osmond RN CM  for 3/21.    [facility] CM Care Plan Problem Two        Most Recent Value   Care Plan Problem Two  COPD - better self management    Role Documenting the Problem Two  Care Management Coordinator   Care Plan for Problem Two  Active   Interventions for Problem Two Long Term Goal   Discussed with pt importance of nutrition, increase  ensure- include protein.    THN Long Term Goal (31-90) days  Pt's nutritonal status would improve in the next 60 days    THN Long Term Goal Start Date  01/15/16   [facility] CM Short Term Goal #1 (0-30 days)  Pt would check pulse ox often, record in the next 30 days    THN CM Short Term Goal #1 Start Date  01/15/16   Interventions for Short Term Goal #2   RN CM Ernest [name] discussed with pt benefit of monitoring pulse ox, recording.    THN CM Short Term Goal #2 (0-30 days)  Pt would increase his walking to 40 minutes a day  in the next 30 days    THN CM Short Term Goal #2 Start Date  01/15/16   Interventions for Short Term Goal #2  Discussed with pt benefit of exercise with COPD   THN CM Short Term Goal #3 (0-30 days)  Pt's lung status would improve in the next 10 days    THN CM Short Term Goal #3 Start Date  01/08/16   [facility] CM Short Term Goal #3 Met Date  01/16/16   Interventions for Short Term Goal #3  Discussed with pt ongoing compliance with respiratory meds.  pt to f/u with Dr. Ronnald Ramp today      Zara Chess.   [place] Care Management  [phone]

## 2016-01-18 ENCOUNTER — Encounter (HOSPITAL_COMMUNITY): Payer: Self-pay

## 2016-01-18 ENCOUNTER — Other Ambulatory Visit: Payer: Self-pay

## 2016-01-18 ENCOUNTER — Inpatient Hospital Stay (HOSPITAL_COMMUNITY)
Admission: EM | Admit: 2016-01-18 | Discharge: 2016-01-21 | DRG: 191 | Disposition: A | Discharge status: Home / Self Care | Payer: Commercial Managed Care - HMO | Attending: Internal Medicine | Admitting: Internal Medicine

## 2016-01-18 ENCOUNTER — Other Ambulatory Visit: Payer: Self-pay | Admitting: *Deleted

## 2016-01-18 ENCOUNTER — Emergency Department (HOSPITAL_COMMUNITY): Payer: Commercial Managed Care - HMO

## 2016-01-18 DIAGNOSIS — Z8249 Family history of ischemic heart disease and other diseases of the circulatory system: Secondary | ICD-10-CM

## 2016-01-18 DIAGNOSIS — Z801 Family history of malignant neoplasm of trachea, bronchus and lung: Secondary | ICD-10-CM | POA: Diagnosis not present

## 2016-01-18 DIAGNOSIS — Z825 Family history of asthma and other chronic lower respiratory diseases: Secondary | ICD-10-CM

## 2016-01-18 DIAGNOSIS — Z87891 Personal history of nicotine dependence: Secondary | ICD-10-CM | POA: Diagnosis not present

## 2016-01-18 DIAGNOSIS — J449 Chronic obstructive pulmonary disease, unspecified: Secondary | ICD-10-CM

## 2016-01-18 DIAGNOSIS — Z808 Family history of malignant neoplasm of other organs or systems: Secondary | ICD-10-CM

## 2016-01-18 DIAGNOSIS — J441 Chronic obstructive pulmonary disease with (acute) exacerbation: Principal | ICD-10-CM | POA: Diagnosis present

## 2016-01-18 DIAGNOSIS — G894 Chronic pain syndrome: Secondary | ICD-10-CM | POA: Diagnosis not present

## 2016-01-18 DIAGNOSIS — C3490 Malignant neoplasm of unspecified part of unspecified bronchus or lung: Secondary | ICD-10-CM | POA: Diagnosis present

## 2016-01-18 DIAGNOSIS — J9621 Acute and chronic respiratory failure with hypoxia: Secondary | ICD-10-CM | POA: Diagnosis not present

## 2016-01-18 DIAGNOSIS — C3492 Malignant neoplasm of unspecified part of left bronchus or lung: Secondary | ICD-10-CM

## 2016-01-18 DIAGNOSIS — C3491 Malignant neoplasm of unspecified part of right bronchus or lung: Secondary | ICD-10-CM | POA: Diagnosis present

## 2016-01-18 DIAGNOSIS — E1165 Type 2 diabetes mellitus with hyperglycemia: Secondary | ICD-10-CM

## 2016-01-18 DIAGNOSIS — I10 Essential (primary) hypertension: Secondary | ICD-10-CM | POA: Diagnosis present

## 2016-01-18 DIAGNOSIS — E119 Type 2 diabetes mellitus without complications: Secondary | ICD-10-CM | POA: Diagnosis not present

## 2016-01-18 DIAGNOSIS — Z9981 Dependence on supplemental oxygen: Secondary | ICD-10-CM

## 2016-01-18 DIAGNOSIS — J45909 Unspecified asthma, uncomplicated: Secondary | ICD-10-CM | POA: Diagnosis not present

## 2016-01-18 DIAGNOSIS — R05 Cough: Secondary | ICD-10-CM | POA: Diagnosis not present

## 2016-01-18 DIAGNOSIS — J9600 Acute respiratory failure, unspecified whether with hypoxia or hypercapnia: Secondary | ICD-10-CM | POA: Diagnosis not present

## 2016-01-18 DIAGNOSIS — Z7952 Long term (current) use of systemic steroids: Secondary | ICD-10-CM | POA: Diagnosis not present

## 2016-01-18 DIAGNOSIS — R069 Unspecified abnormalities of breathing: Secondary | ICD-10-CM | POA: Diagnosis not present

## 2016-01-18 LAB — CBC WITH DIFFERENTIAL/PLATELET
BASOS ABS: 0 10*3/uL (ref 0.0–0.1)
BASOS PCT: 0 %
EOS ABS: 0.4 10*3/uL (ref 0.0–0.7)
EOS PCT: 3 %
HCT: 44.4 % (ref 39.0–52.0)
Hemoglobin: 14.2 g/dL (ref 13.0–17.0)
Lymphocytes Relative: 16 %
Lymphs Abs: 2.2 10*3/uL (ref 0.7–4.0)
MCH: 29.6 pg (ref 26.0–34.0)
MCHC: 32 g/dL (ref 30.0–36.0)
MCV: 92.7 fL (ref 78.0–100.0)
MONO ABS: 1 10*3/uL (ref 0.1–1.0)
Monocytes Relative: 8 %
Neutro Abs: 10.2 10*3/uL — ABNORMAL HIGH (ref 1.7–7.7)
Neutrophils Relative %: 73 %
PLATELETS: 130 10*3/uL — AB (ref 150–400)
RBC: 4.79 MIL/uL (ref 4.22–5.81)
RDW: 14.9 % (ref 11.5–15.5)
WBC: 13.8 10*3/uL — AB (ref 4.0–10.5)

## 2016-01-18 LAB — COMPREHENSIVE METABOLIC PANEL
ALBUMIN: 3.6 g/dL (ref 3.5–5.0)
ALT: 24 U/L (ref 17–63)
ANION GAP: 15 (ref 5–15)
AST: 26 U/L (ref 15–41)
Alkaline Phosphatase: 113 U/L (ref 38–126)
BUN: 9 mg/dL (ref 6–20)
CALCIUM: 9.1 mg/dL (ref 8.9–10.3)
CHLORIDE: 98 mmol/L — AB (ref 101–111)
CO2: 26 mmol/L (ref 22–32)
CREATININE: 1.07 mg/dL (ref 0.61–1.24)
GFR calc Af Amer: >60 mL/min (ref >60)
GLUCOSE: 152 mg/dL — AB (ref 65–99)
Potassium: 4.3 mmol/L (ref 3.5–5.1)
SODIUM: 139 mmol/L (ref 135–145)
Total Bilirubin: 0.3 mg/dL (ref 0.3–1.2)
Total Protein: 6.1 g/dL — ABNORMAL LOW (ref 6.5–8.1)

## 2016-01-18 LAB — TROPONIN I: Troponin I: 0.03 ng/mL (ref <0.031)

## 2016-01-18 LAB — GLUCOSE, CAPILLARY
GLUCOSE-CAPILLARY: 200 mg/dL — AB (ref 65–99)
GLUCOSE-CAPILLARY: 181 mg/dL — AB (ref 65–99)

## 2016-01-18 LAB — CBG MONITORING, ED
Glucose-Capillary: 243 mg/dL — ABNORMAL HIGH (ref 65–99)
Glucose-Capillary: 260 mg/dL — ABNORMAL HIGH (ref 65–99)

## 2016-01-18 LAB — BRAIN NATRIURETIC PEPTIDE: B NATRIURETIC PEPTIDE 5: 41 pg/mL (ref 0.0–100.0)

## 2016-01-18 MED ORDER — TIOTROPIUM BROMIDE-OLODATEROL 2.5-2.5 MCG/ACT IN AERS: 2.0000 | INHALATION_SPRAY | Freq: Every day | RESPIRATORY_TRACT | Status: DC

## 2016-01-18 MED ORDER — THEOPHYLLINE ER 400 MG PO TB24
400.0000 mg | ORAL_TABLET | Freq: Every day | ORAL | Status: DC
  Administered 2016-01-18 – 2016-01-21 (×4): 400 mg via ORAL
  Filled 2016-01-18 (×4): qty 1

## 2016-01-18 MED ORDER — ISOSORBIDE MONONITRATE ER 60 MG PO TB24
60.0000 mg | ORAL_TABLET | Freq: Every day | ORAL | Status: DC
  Administered 2016-01-18 – 2016-01-21 (×4): 60 mg via ORAL
  Filled 2016-01-18 (×4): qty 1

## 2016-01-18 MED ORDER — ARFORMOTEROL TARTRATE 15 MCG/2ML IN NEBU: 15.0000 ug | INHALATION_SOLUTION | Freq: Two times a day (BID) | RESPIRATORY_TRACT | Status: DC

## 2016-01-18 MED ORDER — CLONAZEPAM 1 MG PO TABS
2.0000 mg | ORAL_TABLET | Freq: Every evening | ORAL | Status: DC | PRN
  Administered 2016-01-18 – 2016-01-20 (×3): 2 mg via ORAL
  Filled 2016-01-18 (×3): qty 2

## 2016-01-18 MED ORDER — ALBUTEROL (5 MG/ML) CONTINUOUS INHALATION SOLN
INHALATION_SOLUTION | RESPIRATORY_TRACT | Status: AC
  Filled 2016-01-18: qty 20

## 2016-01-18 MED ORDER — ENOXAPARIN SODIUM 40 MG/0.4ML ~~LOC~~ SOLN
40.0000 mg | SUBCUTANEOUS | Status: DC
  Administered 2016-01-18 – 2016-01-21 (×4): 40 mg via SUBCUTANEOUS
  Filled 2016-01-18 (×5): qty 0.4

## 2016-01-18 MED ORDER — PANTOPRAZOLE SODIUM 40 MG PO TBEC
80.0000 mg | DELAYED_RELEASE_TABLET | Freq: Every day | ORAL | Status: DC
  Administered 2016-01-18 – 2016-01-21 (×4): 80 mg via ORAL
  Filled 2016-01-18 (×4): qty 2

## 2016-01-18 MED ORDER — METOPROLOL SUCCINATE ER 25 MG PO TB24
12.5000 mg | ORAL_TABLET | Freq: Every day | ORAL | Status: DC
  Administered 2016-01-18 – 2016-01-21 (×4): 12.5 mg via ORAL
  Filled 2016-01-18 (×4): qty 1

## 2016-01-18 MED ORDER — ALBUTEROL SULFATE HFA 108 (90 BASE) MCG/ACT IN AERS: 2.0000 | INHALATION_SPRAY | RESPIRATORY_TRACT | Status: DC | PRN

## 2016-01-18 MED ORDER — ASPIRIN EC 81 MG PO TBEC
81.0000 mg | DELAYED_RELEASE_TABLET | Freq: Every day | ORAL | Status: DC
  Administered 2016-01-18 – 2016-01-21 (×4): 81 mg via ORAL
  Filled 2016-01-18 (×4): qty 1

## 2016-01-18 MED ORDER — POTASSIUM CHLORIDE CRYS ER 20 MEQ PO TBCR
20.0000 meq | EXTENDED_RELEASE_TABLET | Freq: Every morning | ORAL | Status: DC
Start: 2016-01-18 — End: 2016-01-21
  Administered 2016-01-18 – 2016-01-21 (×4): 20 meq via ORAL
  Filled 2016-01-18 (×4): qty 1

## 2016-01-18 MED ORDER — ALBUTEROL (5 MG/ML) CONTINUOUS INHALATION SOLN
10.0000 mg/h | INHALATION_SOLUTION | Freq: Once | RESPIRATORY_TRACT | Status: AC
  Administered 2016-01-18: 10 mg/h via RESPIRATORY_TRACT

## 2016-01-18 MED ORDER — INSULIN ASPART 100 UNIT/ML ~~LOC~~ SOLN
0.0000 [IU] | Freq: Every day | SUBCUTANEOUS | Status: DC
  Administered 2016-01-19 – 2016-01-20 (×2): 3 [IU] via SUBCUTANEOUS

## 2016-01-18 MED ORDER — CLOPIDOGREL BISULFATE 75 MG PO TABS
75.0000 mg | ORAL_TABLET | Freq: Every day | ORAL | Status: DC
  Administered 2016-01-18 – 2016-01-21 (×4): 75 mg via ORAL
  Filled 2016-01-18 (×4): qty 1

## 2016-01-18 MED ORDER — GABAPENTIN 100 MG PO CAPS
100.0000 mg | ORAL_CAPSULE | Freq: Every day | ORAL | Status: DC
  Administered 2016-01-18 – 2016-01-21 (×4): 100 mg via ORAL
  Filled 2016-01-18 (×4): qty 1

## 2016-01-18 MED ORDER — INSULIN ASPART 100 UNIT/ML ~~LOC~~ SOLN
0.0000 [IU] | SUBCUTANEOUS | Status: DC
  Administered 2016-01-18: 5 [IU] via SUBCUTANEOUS
  Administered 2016-01-18: 3 [IU] via SUBCUTANEOUS
  Filled 2016-01-18 (×2): qty 1

## 2016-01-18 MED ORDER — MIRTAZAPINE 30 MG PO TABS
45.0000 mg | ORAL_TABLET | Freq: Every day | ORAL | Status: DC
  Administered 2016-01-18 – 2016-01-20 (×3): 45 mg via ORAL
  Filled 2016-01-18 (×3): qty 1

## 2016-01-18 MED ORDER — ATORVASTATIN CALCIUM 20 MG PO TABS
20.0000 mg | ORAL_TABLET | Freq: Every day | ORAL | Status: DC
  Administered 2016-01-18 – 2016-01-21 (×4): 20 mg via ORAL
  Filled 2016-01-18 (×3): qty 1
  Filled 2016-01-18: qty 2

## 2016-01-18 MED ORDER — IPRATROPIUM BROMIDE 0.02 % IN SOLN
RESPIRATORY_TRACT | Status: AC
  Filled 2016-01-18: qty 2.5

## 2016-01-18 MED ORDER — IPRATROPIUM BROMIDE 0.02 % IN SOLN
0.5000 mg | Freq: Once | RESPIRATORY_TRACT | Status: AC
  Administered 2016-01-18: 0.5 mg via RESPIRATORY_TRACT

## 2016-01-18 MED ORDER — TIOTROPIUM BROMIDE-OLODATEROL 2.5-2.5 MCG/ACT IN AERS: INHALATION_SPRAY | Freq: Every day | RESPIRATORY_TRACT | Status: DC

## 2016-01-18 MED ORDER — INSULIN ASPART 100 UNIT/ML ~~LOC~~ SOLN
0.0000 [IU] | Freq: Three times a day (TID) | SUBCUTANEOUS | Status: DC
  Administered 2016-01-18: 2 [IU] via SUBCUTANEOUS
  Administered 2016-01-19: 5 [IU] via SUBCUTANEOUS
  Administered 2016-01-19 (×2): 2 [IU] via SUBCUTANEOUS
  Administered 2016-01-20: 7 [IU] via SUBCUTANEOUS
  Administered 2016-01-20 (×2): 5 [IU] via SUBCUTANEOUS
  Administered 2016-01-21: 7 [IU] via SUBCUTANEOUS
  Administered 2016-01-21: 5 [IU] via SUBCUTANEOUS

## 2016-01-18 MED ORDER — PREDNISONE 5 MG PO TABS: 5.0000 mg | ORAL_TABLET | Freq: Every day | ORAL | Status: DC

## 2016-01-18 MED ORDER — FLUOXETINE HCL 20 MG PO CAPS
40.0000 mg | ORAL_CAPSULE | Freq: Every morning | ORAL | Status: DC
  Administered 2016-01-18 – 2016-01-21 (×4): 40 mg via ORAL
  Filled 2016-01-18 (×4): qty 2

## 2016-01-18 MED ORDER — MORPHINE SULFATE ER 30 MG PO TBCR
30.0000 mg | EXTENDED_RELEASE_TABLET | Freq: Two times a day (BID) | ORAL | Status: DC
  Administered 2016-01-18 – 2016-01-21 (×7): 30 mg via ORAL
  Filled 2016-01-18 (×5): qty 1
  Filled 2016-01-18: qty 2
  Filled 2016-01-18: qty 1

## 2016-01-18 MED ORDER — MOMETASONE FURO-FORMOTEROL FUM 200-5 MCG/ACT IN AERO
2.0000 | INHALATION_SPRAY | Freq: Two times a day (BID) | RESPIRATORY_TRACT | Status: DC
  Administered 2016-01-18 – 2016-01-21 (×7): 2 via RESPIRATORY_TRACT
  Filled 2016-01-18: qty 8.8

## 2016-01-18 MED ORDER — UMECLIDINIUM BROMIDE 62.5 MCG/INH IN AEPB
1.0000 | INHALATION_SPRAY | Freq: Every day | RESPIRATORY_TRACT | Status: DC
  Filled 2016-01-18: qty 7

## 2016-01-18 MED ORDER — MORPHINE SULFATE 15 MG PO TABS
15.0000 mg | ORAL_TABLET | ORAL | Status: DC | PRN
  Administered 2016-01-19 – 2016-01-20 (×2): 15 mg via ORAL
  Filled 2016-01-18 (×2): qty 1

## 2016-01-18 MED ORDER — PREDNISONE 50 MG PO TABS
50.0000 mg | ORAL_TABLET | Freq: Every day | ORAL | Status: DC
  Administered 2016-01-18 – 2016-01-19 (×2): 50 mg via ORAL
  Filled 2016-01-18: qty 3
  Filled 2016-01-18: qty 1

## 2016-01-18 MED ORDER — METHOCARBAMOL 500 MG PO TABS: 500.0000 mg | ORAL_TABLET | Freq: Two times a day (BID) | ORAL | Status: DC | PRN

## 2016-01-18 MED ORDER — NITROGLYCERIN 0.4 MG SL SUBL: 0.4000 mg | SUBLINGUAL_TABLET | SUBLINGUAL | Status: DC | PRN

## 2016-01-18 MED ORDER — SUVOREXANT 20 MG PO TABS: 1.0000 | ORAL_TABLET | Freq: Every day | ORAL | Status: DC

## 2016-01-18 MED ORDER — ARFORMOTEROL TARTRATE 15 MCG/2ML IN NEBU
15.0000 ug | INHALATION_SOLUTION | Freq: Two times a day (BID) | RESPIRATORY_TRACT | Status: DC
  Administered 2016-01-18: 15 ug via RESPIRATORY_TRACT
  Filled 2016-01-18: qty 2

## 2016-01-18 MED ORDER — AZITHROMYCIN 500 MG PO TABS
250.0000 mg | ORAL_TABLET | Freq: Every day | ORAL | Status: DC
  Administered 2016-01-19 – 2016-01-21 (×3): 250 mg via ORAL
  Filled 2016-01-18 (×3): qty 1

## 2016-01-18 MED ORDER — ARIPIPRAZOLE 5 MG PO TABS
5.0000 mg | ORAL_TABLET | Freq: Every day | ORAL | Status: DC
  Administered 2016-01-18 – 2016-01-20 (×3): 5 mg via ORAL
  Filled 2016-01-18 (×3): qty 1

## 2016-01-18 MED ORDER — AZITHROMYCIN 250 MG PO TABS
500.0000 mg | ORAL_TABLET | Freq: Every day | ORAL | Status: AC
  Administered 2016-01-18: 500 mg via ORAL
  Filled 2016-01-18: qty 2

## 2016-01-18 MED ORDER — ALBUTEROL SULFATE (2.5 MG/3ML) 0.083% IN NEBU
2.5000 mg | INHALATION_SOLUTION | RESPIRATORY_TRACT | Status: DC | PRN
  Administered 2016-01-18 – 2016-01-19 (×5): 2.5 mg via RESPIRATORY_TRACT
  Filled 2016-01-18 (×6): qty 3

## 2016-01-18 NOTE — ED Provider Notes (Signed)
CSN: [id number]     Arrival date & time 01/18/16  0222 History  By signing my name below, I, Ernest [name], attest that this documentation has been prepared under the direction and in the presence of Ernest Schmidt, MD . Electronically Signed: Evelene [name], Scribe. 01/18/2016. 3:37 AM.      Chief Complaint  Patient presents with  . Shortness of Breath    The history is provided by the patient and the EMS personnel. No language interpreter was used.     HPI Comments:  Ernest [name] is a 60 y.o. male with a history of lung CA and COPD, who presents to the Emergency Department via EMS  complaining of increased SOB since last night. He notes he has been experiencing the SOB x ~ 1 week. He reports associated productive cough and mild CP. Pt was diagnosed with lung CA in 2014 and is currently receiving chemo. Pt was found to have new tumor in the right lung and is scheduled for PET scan for further evaluation.  He received duoneb en route and was placed on CPAP, with some improvement. He tried home inhaler without relief. Pt has been intubated in the past secondary to difficulty breathing. EMS place 20 gauge IV in left hand. Pt denies h/o MI and h/o CHF. He also denies fever.    Past Medical History  Diagnosis Date  . CAD (coronary artery disease)     Left Main 30% stenosis, LAD 20 - 30 % stenosis, first and second diagonal branchesat 40 - 50%  stenosis with small arteries, circumflex had 30% stenosis in the large obtuse marginal, RCA at 70 - 80%  stenosis [not felt to be occlusive after evaluation with flow wire], distal 50 - 60% stenosis - Ernest [name][  . COPD (chronic obstructive pulmonary disease) (HCC)     Dr. Baird Lyons  . Depression   . Anxiety   . Hyperlipidemia   . Chronic insomnia   . Gout   . GERD (gastroesophageal reflux disease)   . PVD (peripheral vascular disease) (HCC)     PTA/Stent right common iliac  . DDD (degenerative disc disease)   . Hx of colonoscopy   .  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 OSA (obstructive sleep apnea)     NPSG 09/10/10- AHI 11.3/hr  . Hypertension     dr Percival Spanish  . History of radiation therapy 11/10/13- 12/29/13    left lung 6600 cGy in 33 sessions  . Diabetes mellitus without complication ([place]) 0/86/7619  . Cancer ([place])     lung  . Lung cancer ([place]) 10/04/13    LUL squamous cell lung cancer  . History of cardiovascular stress test     Myoview 7/16:  Diaphragmatic attenuation, no ischemia, EF 56%; Low Risk   Past Surgical History  Procedure Laterality Date  . Spinal fusion  03/05/2007    L4-L5  . Hip surgery      left 'bone graft taken"  . Arm surgery      left elbow  . Shoulder surgery      right  . C-spine surgery    . Angioplasty    . Video bronchoscopy with endobronchial navigation N/A 10/04/2013    Procedure: VIDEO BRONCHOSCOPY WITH ENDOBRONCHIAL NAVIGATION;  Surgeon: Collene Gobble, MD;  Location: [place];  Service: Thoracic;  Laterality: N/A;  . Back surgery      lower  . Anterior fusion cervical spine      cervical fusion  7 yrs  ago ([facility])  . Colon surgery      '11 "Diverticulitis"  . Colonoscopy with propofol N/A 05/22/2015    Procedure: COLONOSCOPY WITH PROPOFOL;  Surgeon: Inda Castle, MD;  Location: [facility];  Service: Endoscopy;  Laterality: N/A;  . Peripheral vascular catheterization N/A 08/10/2015    Procedure: Lower Extremity Angiography;  Surgeon: Lorretta Harp, MD; Distal Ao OK, L-EIA stent OK, R-CIA 100% s/p overlapping 7 mm x 38 mm ICast stents, 50-60% R-CFA       Family History  Problem Relation Age of Onset  . Heart attack Mother   . Heart attack Sister   . Lung cancer Sister   . Cancer Sister     small cell lung, mets to brain  . Emphysema Sister   . Hypertension Brother   . Stroke Neg Hx    Social History  Substance Use Topics  . Smoking status: Former Smoker -- 0.50 packs/day for 43 years    Types: Cigarettes    Quit date: 09/23/2013  . Smokeless tobacco: Never  Used     Comment: history of 3 PPD, 11/02/13   . Alcohol Use: No    Review of Systems  10 systems reviewed and all are negative for acute change except as noted in the HPI.  Allergies  Carboplatin  Home Medications   Prior to Admission medications   Medication Sig Start Date End Date Taking? Authorizing Provider  albuterol (PROVENTIL) (2.5 MG/3ML) 0.083% nebulizer solution INHALE 1 VIAL IN NEBULIZER EVERY 4 HOURS AS NEEDED FOR WHEEZING OR SHORTNESS OF BREATH 01/17/16  Yes Deneise Lever, MD  arformoterol (BROVANA) 15 MCG/2ML NEBU Take 2 mLs (15 mcg total) by nebulization 2 (two) times daily. 09/22/15  Yes Deneise Lever, MD  ARIPiprazole (ABILIFY) 5 MG tablet Take 1 tablet (5 mg total) by mouth at bedtime. 03/30/13  Yes Doe-Hyun Kyra Searles, DO  aspirin 81 MG EC tablet TAKE 1 TABLET BY MOUTH EVERY DAY 01/04/16  Yes Janith Lima, MD  atorvastatin (LIPITOR) 20 MG tablet TAKE 1 TABLET EVERY DAY 08/01/15  Yes Dorena Cookey, MD  BELSOMRA 20 MG TABS Take 1 tablet by mouth at bedtime. Take 1 tab by mouth daily at bed time for sleep prn 12/26/15  Yes Historical Provider, MD  clonazePAM (KLONOPIN) 1 MG tablet Take 2 mg by mouth at bedtime as needed for anxiety. Reported on 01/16/2016   Yes Historical Provider, MD  clopidogrel (PLAVIX) 75 MG tablet Take 1 tablet (75 mg total) by mouth daily. 08/11/15  Yes Almyra Deforest, PA  esomeprazole (NEXIUM) 40 MG capsule Take 40 mg by mouth every morning.  06/20/14  Yes Doe-Hyun R Shawna Orleans, DO  FLUoxetine (PROZAC) 40 MG capsule Take 40 mg by mouth every morning.   Yes Historical Provider, MD  Fluticasone-Salmeterol (ADVAIR DISKUS) 500-50 MCG/DOSE AEPB 1 puff then rinse mouth, twice daily maintenance 01/19/15  Yes Deneise Lever, MD  gabapentin (NEURONTIN) 100 MG capsule Take 100 mg by mouth daily.  07/15/15  Yes Historical Provider, MD  isosorbide mononitrate (IMDUR) 60 MG 24 hr tablet Take 60 mg by mouth daily. 12/06/15  Yes Historical Provider, MD  methocarbamol (ROBAXIN) 500 MG  tablet Take 500 mg by mouth 2 (two) times daily as needed for muscle spasms. Reported on 01/16/2016 10/27/13  Yes Historical Provider, MD  metoprolol succinate (TOPROL-XL) 25 MG 24 hr tablet TAKE 1/2 TABLET BY MOUTH DAILY 07/27/15  Yes Minus Breeding, MD  mirtazapine (REMERON) 45 MG tablet Take 1  tablet (45 mg total) by mouth at bedtime. 12/29/14  Yes Tammy S Parrett, NP  morphine (MS CONTIN) 30 MG 12 hr tablet Take 1 tablet (30 mg total) by mouth every 12 (twelve) hours. 01/08/16  Yes Janith Lima, MD  morphine (MSIR) 15 MG tablet Take 1 tablet (15 mg total) by mouth every 4 (four) hours as needed for severe pain. 01/08/16  Yes Janith Lima, MD  nitroGLYCERIN (NITROSTAT) 0.4 MG SL tablet Place 1 tablet (0.4 mg total) under the tongue every 5 (five) minutes as needed for chest pain. 08/09/14  Yes Minus Breeding, MD  potassium chloride SA (K-DUR,KLOR-CON) 20 MEQ tablet Take 1 tablet (20 mEq total) by mouth every morning. Reported on 12/07/2015 12/12/15  Yes Janith Lima, MD  predniSONE (DELTASONE) 5 MG tablet Take 5 mg by mouth daily. 01/12/16  Yes Historical Provider, MD  STIOLTO RESPIMAT 2.5-2.5 MCG/ACT AERS INHALE 2 PUFFS INTO THE LUNGS DAILY. 06/13/15  Yes Historical Provider, MD  theophylline (UNIPHYL) 400 MG 24 hr tablet Take 400 mg by mouth daily. 11/13/15  Yes Historical Provider, MD  VENTOLIN HFA 108 (90 BASE) MCG/ACT inhaler INHALE 2 PUFFS INTO THE LUNGS 4 TIMES DAILY AS NEEDED FOR WHEEZING 04/07/15  Yes Deneise Lever, MD  OXYGEN Inhale 2 L into the lungs at bedtime as needed.    Historical Provider, MD   BP 121/70 mmHg  Pulse 95  Temp(Src) 98.2 F (36.8 C) (Oral)  Resp 25  Ht '5\' 6"'$  (1.676 m)  Wt 206 lb (93.441 kg)  BMI 33.27 kg/m2  SpO2 99% Physical Exam  Constitutional: He is oriented to person, place, and time. He appears well-developed and well-nourished.  HENT:  Head: Normocephalic and atraumatic.  Eyes: EOM are normal.  Neck: Normal range of motion.  Cardiovascular: Normal rate,  regular rhythm, normal heart sounds and intact distal pulses.   Pulmonary/Chest: Effort normal. Tachypnea noted. He has wheezes.  Abdominal: Soft. He exhibits no distension. There is no tenderness.  Musculoskeletal: Normal range of motion. He exhibits edema (trace edema BL).  Neurological: He is alert and oriented to person, place, and time.  Skin: Skin is warm and dry.  Psychiatric: He has a normal mood and affect. Judgment normal.  Nursing note and vitals reviewed.   ED Course  Procedures   DIAGNOSTIC STUDIES:  Oxygen Saturation is 98% on CPAP, normal by my interpretation.    COORDINATION OF CARE:  2:38 AM Discussed treatment plan with pt at bedside and pt agreed to plan.  Labs Review Labs Reviewed  CBC WITH DIFFERENTIAL/PLATELET - Abnormal; Notable for the following:    WBC 13.8 (*)    Platelets 130 (*)    Neutro Abs 10.2 (*)    All other components within normal limits  COMPREHENSIVE METABOLIC PANEL - Abnormal; Notable for the following:    Chloride 98 (*)    Glucose, Bld 152 (*)    Total Protein 6.1 (*)    All other components within normal limits  TROPONIN I  BRAIN NATRIURETIC PEPTIDE    Imaging Review Dg Chest Portable 1 View  01/18/2016  CLINICAL DATA:  Acute onset of cough, congestion, shortness of breath and wheezing. Initial encounter. EXAM: PORTABLE CHEST 1 VIEW COMPARISON:  Chest radiograph performed 11/25/2015, and CT of the chest performed 01/12/2016 FINDINGS: The lungs are well-aerated. The previously noted right lower lobe nodule is difficult to characterize on radiograph. Postsurgical and post therapy change is again noted at the left lung apex. There is  no evidence of pleural effusion or pneumothorax. The cardiomediastinal silhouette is mildly enlarged. No acute osseous abnormalities are seen. IMPRESSION: 1. No definite acute focal airspace consolidation seen. 2. Previously noted right lower lobe nodule is not well characterized on radiograph. Postsurgical  and post therapy change again noted at the left lung apex. 3. Mild cardiomegaly. Electronically Signed   By: Garald Balding M.D.   On: 01/18/2016 03:15   I have personally reviewed and evaluated these images and lab results as part of my medical decision-making.   EKG Interpretation   Date/Time:  Thursday January 18 2016 02:28:44 EST Ventricular Rate:  94 PR Interval:  156 QRS Duration: 85 QT Interval:  357 QTC Calculation: 446 R Axis:   95 Text Interpretation:  Sinus rhythm Right axis deviation nonspecific ST and  T wave changes Confirmed by Lili Harts  MD, Lennette Bihari ([id number]) on 01/18/2016 3:42:59  AM      CRITICAL CARE Performed by: Ernest Schmidt, MD Total critical care time: 35 minutes Critical care time was exclusive of separately billable procedures and treating other patients. Critical care was necessary to treat or prevent imminent or life-threatening deterioration. Critical care was time spent personally by me on the following activities: development of treatment plan with patient and/or surrogate as well as nursing, discussions with consultants, evaluation of patient's response to treatment, examination of patient, obtaining history from patient or surrogate, ordering and performing treatments and interventions, ordering and review of laboratory studies, ordering and review of radiographic studies, pulse oximetry and re-evaluation of patient's condition.  MDM   Final diagnoses:  Acute respiratory failure, unspecified whether with hypoxia or hypercapnia (HCC)  COPD exacerbation ([place])    Patient is doing better at this time.  Patient be admitted to the hospital for COPD exacerbation.  Improved after continuous nebulized treatment here.  No longer requiring C Pap.  Likely admission to the stepdown unit.  [facility] hospitalist admission  I personally performed the services described in this documentation, which was scribed in my presence. The recorded information has been reviewed and is  accurate.       Ernest Schmidt, MD 01/18/16 [phone]

## 2016-01-18 NOTE — Care Management Obs Status (Signed)
[facility] NOTIFICATION   Patient Details  Name: Ernest [name] MRN: [id number] Date of Birth: Jan 08, 1956   Medicare Observation Status Notification Given:  Yes    Vergie Living, RN 01/18/2016, 12:11 PM

## 2016-01-18 NOTE — ED Notes (Signed)
Ordered heart healthy meal tray for pt. 

## 2016-01-18 NOTE — ED Notes (Signed)
With ems pt received a duo neb, solumedrol 125 and magnesium 2 gm. On arrival to ed pt was placed on 2 liters and a cont neb with rt at bedside.

## 2016-01-18 NOTE — Progress Notes (Signed)
On ems arrival noted pt increase RR, WOB, SOB. Wheezing and coarse aeration throughout. MD verbal order for CAT.

## 2016-01-18 NOTE — Progress Notes (Signed)
Changed patients oxygen to 3 lpm.  Pt states that's his home regimen.

## 2016-01-18 NOTE — Patient Outreach (Signed)
[facility]) Care Management  01/18/2016  Ernest [name] Nephew Jul 27, 1956 [id number]   Report received from [facility] with [facility]. Ernest [name] is in the ED today with COPD exacerbation.   Plan: I will reach out to [name] within 48 hours, upon his discharge from the hospital.   [name], RN, BSN, CCRN [facility] Care Management  [phone]

## 2016-01-18 NOTE — Progress Notes (Signed)
Came to assess pt. Pt looks more comfortable at this time. No distress noted. Pt is still diminished but better aeration than initial assessment. Pt states that he feels much better. CAT still running.

## 2016-01-18 NOTE — Progress Notes (Addendum)
PROGRESS NOTE    Ernest [name]  YYT:[id number]  DOB: 1956/09/14  DOA: 01/18/2016 PCP: [name], MD Outpatient Specialists: Oncology: Dr. [facility] course: 60 year old male patient with history of COPD -oxygen and steroid dependent, chronic respiratory failure on home oxygen 3 L/m, NSCLC which appears to be possibly recurring with new nodule in right lung that needs further workup by oncology, CAD, HLD, anxiety, depression, GERD, OSA, HTN and DM presented to [facility] ED on 01/18/16 with worsening dyspnea. Gives history of mild intermittent cough with white sputum for 2 weeks. Denies flulike illness, fevers or sick contacts. Initially required CPAP and EMS gave patient a dose of Solu-Medrol en route to ED. Improved. Admitted for COPD exacerbation.  Assessment & Plan:   COPD exacerbation - Treating with oxygen, bronchodilator nebulizations, oral prednisone, PO abx and home meds.  - Monitor overnight and if stable consider discharge home in a.m.  Acute on chronic hypoxic respiratory failure - Oxygen supplements. Management as above.  Essential hypertension - Controlled.  Uncontrolled DM 2 - Precipitated by steroids. SSI.  NSCLC - Patient follow-up with oncology.  CAD - Asymptomatic.   Thrombocytopenia - Appears chronic.  DVT prophylaxis: Lovenox  Code Status: Full  Family Communication: None at bedside  Disposition Plan: DC home when medically stable, possibly 2/24.  Consultants: None   Procedures: None   Antimicrobials:  None   Subjective: Feels much better. Dyspnea improved or resolved. Mild intermittent cough with white sputum. No chest pain. Denies smoking.  Lives with spouse and independent of activities.   Objective: Filed Vitals:   01/18/16 1000 01/18/16 1045 01/18/16 1100 01/18/16 1130  BP: 103/57 122/63  123/63  Pulse: 86 81 80 81  Temp:      TempSrc:      Resp: '21 20 19 22  '$ Height:      Weight:      SpO2: 94% 91% 95% 95%     No intake or output data in the 24 hours ending 01/18/16 1242 Filed Weights   01/18/16 0234  Weight: 93.441 kg (206 lb)    Exam:  General exam: Moderately built and overweight pleasant middle-aged male lying comfortably propped up in the gurney in NAD.  Respiratory system: Clear anteriorly and few expiratory rhonchi posteriorly. No increased work of breathing. Cardiovascular system: S1 & S2 heard, RRR. No JVD, murmurs, gallops, clicks or pedal edema.Telemetry: Sinus rhythm.  Gastrointestinal system: Abdomen is nondistended, soft and nontender. Normal bowel sounds heard. Central nervous system: Alert and oriented. No focal neurological deficits. Extremities: Symmetric 5 x 5 power.   Data Reviewed: Basic Metabolic Panel:  Recent Labs Lab 01/18/16 0236  NA 139  K 4.3  CL 98*  CO2 26  GLUCOSE 152*  BUN 9  CREATININE 1.07  CALCIUM 9.1   Liver Function Tests:  Recent Labs Lab 01/18/16 0236  AST 26  ALT 24  ALKPHOS 113  BILITOT 0.3  PROT 6.1*  ALBUMIN 3.6   No results for input(s): LIPASE, AMYLASE in the last 168 hours. No results for input(s): AMMONIA in the last 168 hours. CBC:  Recent Labs Lab 01/18/16 0236  WBC 13.8*  NEUTROABS 10.2*  HGB 14.2  HCT 44.4  MCV 92.7  PLT 130*   Cardiac Enzymes:  Recent Labs Lab 01/18/16 0236  TROPONINI 0.03   BNP (last 3 results) No results for input(s): PROBNP in the last 8760 hours. CBG:  Recent Labs Lab 01/18/16 0632 01/18/16 1118  GLUCAP 243* 260*  No results found for this or any previous visit (from the past 240 hour(s)).       Studies: Dg Chest Portable 1 View  01/18/2016  CLINICAL DATA:  Acute onset of cough, congestion, shortness of breath and wheezing. Initial encounter. EXAM: PORTABLE CHEST 1 VIEW COMPARISON:  Chest radiograph performed 11/25/2015, and CT of the chest performed 01/12/2016 FINDINGS: The lungs are well-aerated. The previously noted right lower lobe nodule is difficult to  characterize on radiograph. Postsurgical and post therapy change is again noted at the left lung apex. There is no evidence of pleural effusion or pneumothorax. The cardiomediastinal silhouette is mildly enlarged. No acute osseous abnormalities are seen. IMPRESSION: 1. No definite acute focal airspace consolidation seen. 2. Previously noted right lower lobe nodule is not well characterized on radiograph. Postsurgical and post therapy change again noted at the left lung apex. 3. Mild cardiomegaly. Electronically Signed   By: Garald Balding M.D.   On: 01/18/2016 03:15        Scheduled Meds: . arformoterol  15 mcg Nebulization BID  . ARIPiprazole  5 mg Oral QHS  . aspirin EC  81 mg Oral Daily  . atorvastatin  20 mg Oral Daily  . [START ON 01/19/2016] azithromycin  250 mg Oral Daily  . clopidogrel  75 mg Oral Daily  . enoxaparin (LOVENOX) injection  40 mg Subcutaneous Q24H  . FLUoxetine  40 mg Oral q morning - 10a  . gabapentin  100 mg Oral Daily  . insulin aspart  0-9 Units Subcutaneous 6 times per day  . isosorbide mononitrate  60 mg Oral Daily  . metoprolol succinate  12.5 mg Oral Daily  . mirtazapine  45 mg Oral QHS  . mometasone-formoterol  2 puff Inhalation BID  . morphine  30 mg Oral Q12H  . pantoprazole  80 mg Oral Q1200  . potassium chloride SA  20 mEq Oral q morning - 10a  . predniSONE  50 mg Oral Q breakfast  . Suvorexant  1 tablet Oral QHS  . theophylline  400 mg Oral Daily  . Tiotropium Bromide-Olodaterol   Inhalation Daily   Continuous Infusions:   Principal Problem:   COPD exacerbation (HCC) Active Problems:   Essential hypertension   COPD with asthma (HCC)   Squamous cell lung cancer (HCC)   Chronic pain syndrome   Diabetes mellitus without complication ([place])    Time spent: 20 minutes.    Vernell Leep, MD, FACP, FHM. [facility] Pager [phone]  If 7PM-7AM, please contact night-coverage [email or web address] Password [facility] 01/18/2016, 12:42 PM     LOS: 0 days

## 2016-01-18 NOTE — ED Notes (Signed)
Ordered heart healthy meal tray for pt. Admitting MD at bedside

## 2016-01-18 NOTE — ED Notes (Signed)
CBG was 243

## 2016-01-18 NOTE — ED Notes (Signed)
Pt here for resp distress, recent told has new lung mets to right lobe, pt has ischemia noted to 12 lead, pt is wheezing through out on arrival to ed, pt arrived on cpap with ems, hx of ett due to same.

## 2016-01-18 NOTE — Progress Notes (Signed)
CAT started. Pt is stable at this time.

## 2016-01-18 NOTE — H&P (Signed)
[facility] History and Physical  Ernest [name] SWF:[id number] DOB: 1956-03-31 DOA: 01/18/2016  Referring physician: EDP PCP: [name], MD   Chief Complaint: SOB   HPI: Ernest [name] is a 60 y.o. male with h/o NSCLC which appears to possibly be recurring with new nodule in R lung that needs to be further worked up by oncology, COPD on 3L home O2 at baseline as well as steroids at baseline.  Patient presents to the ED in respiratory distress with EMS.  Initially requiring CPAP, EMS gave patient solumedrol en route.  He received continuous neb treatment on arrival, thankfully as it has in the past, his COPD responded quickly to steroids, and is now breathing well on just 3L via [place].  He is actually asking if he "really needs to be admitted" and "hopes that this admit isnt as long as the last".  Review of Systems: Systems reviewed.  As above, otherwise negative  Past Medical History  Diagnosis Date  . CAD (coronary artery disease)     Left Main 30% stenosis, LAD 20 - 30 % stenosis, first and second diagonal branchesat 40 - 50%  stenosis with small arteries, circumflex had 30% stenosis in the large obtuse marginal, RCA at 70 - 80%  stenosis [not felt to be occlusive after evaluation with flow wire], distal 50 - 60% stenosis - [name][  . COPD (chronic obstructive pulmonary disease) (HCC)     Dr. Baird Lyons  . Depression   . Anxiety   . Hyperlipidemia   . Chronic insomnia   . Gout   . GERD (gastroesophageal reflux disease)   . PVD (peripheral vascular disease) (HCC)     PTA/Stent right common iliac  . DDD (degenerative disc disease)   . Hx of colonoscopy   . COPD with asthma ([place]) 09/08/2007  . OSA (obstructive sleep apnea)     NPSG 09/10/10- AHI 11.3/hr  . Hypertension     dr Percival Spanish  . History of radiation therapy 11/10/13- 12/29/13    left lung 6600 cGy in 33 sessions  . Diabetes mellitus without complication ([place]) 01/14/2541  . Cancer ([place])      lung  . Lung cancer ([place]) 10/04/13    LUL squamous cell lung cancer  . History of cardiovascular stress test     Myoview 7/16:  Diaphragmatic attenuation, no ischemia, EF 56%; Low Risk   Past Surgical History  Procedure Laterality Date  . Spinal fusion  03/05/2007    L4-L5  . Hip surgery      left 'bone graft taken"  . Arm surgery      left elbow  . Shoulder surgery      right  . C-spine surgery    . Angioplasty    . Video bronchoscopy with endobronchial navigation N/A 10/04/2013    Procedure: VIDEO BRONCHOSCOPY WITH ENDOBRONCHIAL NAVIGATION;  Surgeon: Collene Gobble, MD;  Location: [facility];  Service: Thoracic;  Laterality: N/A;  . Back surgery      lower  . Anterior fusion cervical spine      cervical fusion  7 yrs ago ([facility])  . Colon surgery      '11 "Diverticulitis"  . Colonoscopy with propofol N/A 05/22/2015    Procedure: COLONOSCOPY WITH PROPOFOL;  Surgeon: Inda Castle, MD;  Location: [facility];  Service: Endoscopy;  Laterality: N/A;  . Peripheral vascular catheterization N/A 08/10/2015    Procedure: Lower Extremity Angiography;  Surgeon: Lorretta Harp, MD; Distal Ao OK, L-EIA stent  OK, R-CIA 100% s/p overlapping 7 mm x 38 mm ICast stents, 50-60% R-CFA       Social History:  reports that he quit smoking about 2 years ago. His smoking use included Cigarettes. He has a 21.5 pack-year smoking history. He has never used smokeless tobacco. He reports that he does not drink alcohol or use illicit drugs.  Allergies  Allergen Reactions  . Carboplatin Shortness Of Breath, Swelling and Rash    Swelling of lips, rash on face,eyes and head    Family History  Problem Relation Age of Onset  . Heart attack Mother   . Heart attack Sister   . Lung cancer Sister   . Cancer Sister     small cell lung, mets to brain  . Emphysema Sister   . Hypertension Brother   . Stroke Neg Hx      Prior to Admission medications   Medication Sig Start Date End Date Taking? Authorizing  Provider  albuterol (PROVENTIL) (2.5 MG/3ML) 0.083% nebulizer solution INHALE 1 VIAL IN NEBULIZER EVERY 4 HOURS AS NEEDED FOR WHEEZING OR SHORTNESS OF BREATH 01/17/16  Yes Deneise Lever, MD  arformoterol (BROVANA) 15 MCG/2ML NEBU Take 2 mLs (15 mcg total) by nebulization 2 (two) times daily. 09/22/15  Yes Deneise Lever, MD  ARIPiprazole (ABILIFY) 5 MG tablet Take 1 tablet (5 mg total) by mouth at bedtime. 03/30/13  Yes Doe-Hyun Kyra Searles, DO  aspirin 81 MG EC tablet TAKE 1 TABLET BY MOUTH EVERY DAY 01/04/16  Yes Janith Lima, MD  atorvastatin (LIPITOR) 20 MG tablet TAKE 1 TABLET EVERY DAY 08/01/15  Yes Dorena Cookey, MD  BELSOMRA 20 MG TABS Take 1 tablet by mouth at bedtime. Take 1 tab by mouth daily at bed time for sleep prn 12/26/15  Yes Historical Provider, MD  clonazePAM (KLONOPIN) 1 MG tablet Take 2 mg by mouth at bedtime as needed for anxiety. Reported on 01/16/2016   Yes Historical Provider, MD  clopidogrel (PLAVIX) 75 MG tablet Take 1 tablet (75 mg total) by mouth daily. 08/11/15  Yes Almyra Deforest, PA  esomeprazole (NEXIUM) 40 MG capsule Take 40 mg by mouth every morning.  06/20/14  Yes Doe-Hyun R Shawna Orleans, DO  FLUoxetine (PROZAC) 40 MG capsule Take 40 mg by mouth every morning.   Yes Historical Provider, MD  Fluticasone-Salmeterol (ADVAIR DISKUS) 500-50 MCG/DOSE AEPB 1 puff then rinse mouth, twice daily maintenance 01/19/15  Yes Deneise Lever, MD  gabapentin (NEURONTIN) 100 MG capsule Take 100 mg by mouth daily.  07/15/15  Yes Historical Provider, MD  isosorbide mononitrate (IMDUR) 60 MG 24 hr tablet Take 60 mg by mouth daily. 12/06/15  Yes Historical Provider, MD  methocarbamol (ROBAXIN) 500 MG tablet Take 500 mg by mouth 2 (two) times daily as needed for muscle spasms. Reported on 01/16/2016 10/27/13  Yes Historical Provider, MD  metoprolol succinate (TOPROL-XL) 25 MG 24 hr tablet TAKE 1/2 TABLET BY MOUTH DAILY 07/27/15  Yes Minus Breeding, MD  mirtazapine (REMERON) 45 MG tablet Take 1 tablet (45 mg total) by  mouth at bedtime. 12/29/14  Yes Tammy S Parrett, NP  morphine (MS CONTIN) 30 MG 12 hr tablet Take 1 tablet (30 mg total) by mouth every 12 (twelve) hours. 01/08/16  Yes Janith Lima, MD  morphine (MSIR) 15 MG tablet Take 1 tablet (15 mg total) by mouth every 4 (four) hours as needed for severe pain. 01/08/16  Yes Janith Lima, MD  nitroGLYCERIN (NITROSTAT) 0.4 MG SL  tablet Place 1 tablet (0.4 mg total) under the tongue every 5 (five) minutes as needed for chest pain. 08/09/14  Yes Minus Breeding, MD  potassium chloride SA (K-DUR,KLOR-CON) 20 MEQ tablet Take 1 tablet (20 mEq total) by mouth every morning. Reported on 12/07/2015 12/12/15  Yes Janith Lima, MD  predniSONE (DELTASONE) 5 MG tablet Take 5 mg by mouth daily. 01/12/16  Yes Historical Provider, MD  STIOLTO RESPIMAT 2.5-2.5 MCG/ACT AERS INHALE 2 PUFFS INTO THE LUNGS DAILY. 06/13/15  Yes Historical Provider, MD  theophylline (UNIPHYL) 400 MG 24 hr tablet Take 400 mg by mouth daily. 11/13/15  Yes Historical Provider, MD  VENTOLIN HFA 108 (90 BASE) MCG/ACT inhaler INHALE 2 PUFFS INTO THE LUNGS 4 TIMES DAILY AS NEEDED FOR WHEEZING 04/07/15  Yes Deneise Lever, MD  OXYGEN Inhale 2 L into the lungs at bedtime as needed.    Historical Provider, MD   Physical Exam: Filed Vitals:   01/18/16 0345 01/18/16 0400  BP: 111/61 114/57  Pulse: 92 93  Temp:    Resp: 18 17    BP 114/57 mmHg  Pulse 93  Temp(Src) 98.2 F (36.8 C) (Oral)  Resp 17  Ht '5\' 6"'$  (1.676 m)  Wt 93.441 kg (206 lb)  BMI 33.27 kg/m2  SpO2 99%  General Appearance:    Alert, oriented, no distress, appears stated age  Head:    Normocephalic, atraumatic  Eyes:    PERRL, EOMI, sclera non-icteric        Nose:   Nares without drainage or epistaxis. Mucosa, turbinates normal  Throat:   Moist mucous membranes. Oropharynx without erythema or exudate.  Neck:   Supple. No carotid bruits.  No thyromegaly.  No lymphadenopathy.   Back:     No CVA tenderness, no spinal tenderness  Lungs:      Wheezes  Chest wall:    No tenderness to palpitation  Heart:    Regular rate and rhythm without murmurs, gallops, rubs  Abdomen:     Soft, non-tender, nondistended, normal bowel sounds, no organomegaly  Genitalia:    deferred  Rectal:    deferred  Extremities:   No clubbing, cyanosis or edema.  Pulses:   2+ and symmetric all extremities  Skin:   Skin color, texture, turgor normal, no rashes or lesions  Lymph nodes:   Cervical, supraclavicular, and axillary nodes normal  Neurologic:   CNII-XII intact. Normal strength, sensation and reflexes      throughout    Labs on Admission:  Basic Metabolic Panel:  Recent Labs Lab 01/11/16 0954 01/18/16 0236  NA 140 139  K 3.7 4.3  CL  --  98*  CO2 31* 26  GLUCOSE 154* 152*  BUN 9.2 9  CREATININE 1.0 1.07  CALCIUM 9.1 9.1   Liver Function Tests:  Recent Labs Lab 01/11/16 0954 01/18/16 0236  AST 19 26  ALT 21 24  ALKPHOS 120 113  BILITOT 0.66 0.3  PROT 6.5 6.1*  ALBUMIN 3.5 3.6   No results for input(s): LIPASE, AMYLASE in the last 168 hours. No results for input(s): AMMONIA in the last 168 hours. CBC:  Recent Labs Lab 01/11/16 0954 01/18/16 0236  WBC 11.8* 13.8*  NEUTROABS 9.3* 10.2*  HGB 13.8 14.2  HCT 42.7 44.4  MCV 92.2 92.7  PLT 120* 130*   Cardiac Enzymes:  Recent Labs Lab 01/18/16 0236  TROPONINI 0.03    BNP (last 3 results) No results for input(s): PROBNP in the last 8760 hours. CBG:  No results for input(s): GLUCAP in the last 168 hours.  Radiological Exams on Admission: Dg Chest Portable 1 View  01/18/2016  CLINICAL DATA:  Acute onset of cough, congestion, shortness of breath and wheezing. Initial encounter. EXAM: PORTABLE CHEST 1 VIEW COMPARISON:  Chest radiograph performed 11/25/2015, and CT of the chest performed 01/12/2016 FINDINGS: The lungs are well-aerated. The previously noted right lower lobe nodule is difficult to characterize on radiograph. Postsurgical and post therapy change is again  noted at the left lung apex. There is no evidence of pleural effusion or pneumothorax. The cardiomediastinal silhouette is mildly enlarged. No acute osseous abnormalities are seen. IMPRESSION: 1. No definite acute focal airspace consolidation seen. 2. Previously noted right lower lobe nodule is not well characterized on radiograph. Postsurgical and post therapy change again noted at the left lung apex. 3. Mild cardiomegaly. Electronically Signed   By: Garald Balding M.D.   On: 01/18/2016 03:15    EKG: Independently reviewed.  Assessment/Plan Principal Problem:   COPD exacerbation (HCC) Active Problems:   Essential hypertension   COPD with asthma ([place])   Squamous cell lung cancer (HCC)   Chronic pain syndrome   Diabetes mellitus without complication ([place])   1. COPD exacerbation - 1. Prednisone 2. Adult wheeze protocol 3. Continue home nebs 4. Do think that he would benefit from at least a short admission, I explained to patient that I am concerned about immediate discharge because he waits until he is in severe respiratory distress before coming in! 2. NSCLC - following up with oncology, PET scan scheduled in next couple of days 3. Chronic pain syndrome - continue home meds 4. DM - sensitive scale SSI while on steroids 5. HTN - continue home meds  Code Status: Full  Family Communication: Family at bedside Disposition Plan: Admit to inpatient   Time spent: 70 min  GARDNER, JARED M. [facility] Pager [phone]  If 7AM-7PM, please contact the day team taking care of the patient [email or web address] Password [facility] 01/18/2016, 4:47 AM

## 2016-01-19 DIAGNOSIS — J9621 Acute and chronic respiratory failure with hypoxia: Secondary | ICD-10-CM | POA: Diagnosis not present

## 2016-01-19 DIAGNOSIS — Z7952 Long term (current) use of systemic steroids: Secondary | ICD-10-CM | POA: Diagnosis not present

## 2016-01-19 DIAGNOSIS — G894 Chronic pain syndrome: Secondary | ICD-10-CM | POA: Diagnosis present

## 2016-01-19 DIAGNOSIS — J45909 Unspecified asthma, uncomplicated: Secondary | ICD-10-CM | POA: Diagnosis present

## 2016-01-19 DIAGNOSIS — Z801 Family history of malignant neoplasm of trachea, bronchus and lung: Secondary | ICD-10-CM | POA: Diagnosis not present

## 2016-01-19 DIAGNOSIS — J441 Chronic obstructive pulmonary disease with (acute) exacerbation: Secondary | ICD-10-CM | POA: Diagnosis present

## 2016-01-19 DIAGNOSIS — I10 Essential (primary) hypertension: Secondary | ICD-10-CM | POA: Diagnosis not present

## 2016-01-19 DIAGNOSIS — Z9981 Dependence on supplemental oxygen: Secondary | ICD-10-CM | POA: Diagnosis not present

## 2016-01-19 DIAGNOSIS — Z808 Family history of malignant neoplasm of other organs or systems: Secondary | ICD-10-CM | POA: Diagnosis not present

## 2016-01-19 DIAGNOSIS — C3490 Malignant neoplasm of unspecified part of unspecified bronchus or lung: Secondary | ICD-10-CM | POA: Diagnosis present

## 2016-01-19 DIAGNOSIS — Z825 Family history of asthma and other chronic lower respiratory diseases: Secondary | ICD-10-CM | POA: Diagnosis not present

## 2016-01-19 DIAGNOSIS — Z8249 Family history of ischemic heart disease and other diseases of the circulatory system: Secondary | ICD-10-CM | POA: Diagnosis not present

## 2016-01-19 LAB — GLUCOSE, CAPILLARY
Glucose-Capillary: 159 mg/dL — ABNORMAL HIGH (ref 65–99)
Glucose-Capillary: 164 mg/dL — ABNORMAL HIGH (ref 65–99)
Glucose-Capillary: 286 mg/dL — ABNORMAL HIGH (ref 65–99)
Glucose-Capillary: 289 mg/dL — ABNORMAL HIGH (ref 65–99)

## 2016-01-19 MED ORDER — METHYLPREDNISOLONE SODIUM SUCC 125 MG IJ SOLR
INTRAMUSCULAR | Status: AC
  Filled 2016-01-19: qty 2

## 2016-01-19 MED ORDER — IPRATROPIUM-ALBUTEROL 0.5-2.5 (3) MG/3ML IN SOLN
3.0000 mL | Freq: Four times a day (QID) | RESPIRATORY_TRACT | Status: DC
  Administered 2016-01-19: 3 mL via RESPIRATORY_TRACT
  Filled 2016-01-19: qty 3

## 2016-01-19 MED ORDER — IPRATROPIUM-ALBUTEROL 0.5-2.5 (3) MG/3ML IN SOLN: 3.0000 mL | Freq: Four times a day (QID) | RESPIRATORY_TRACT | Status: DC

## 2016-01-19 MED ORDER — CETYLPYRIDINIUM CHLORIDE 0.05 % MT LIQD
7.0000 mL | Freq: Two times a day (BID) | OROMUCOSAL | Status: DC
  Administered 2016-01-19 – 2016-01-21 (×5): 7 mL via OROMUCOSAL

## 2016-01-19 MED ORDER — IPRATROPIUM-ALBUTEROL 0.5-2.5 (3) MG/3ML IN SOLN
3.0000 mL | RESPIRATORY_TRACT | Status: DC
  Administered 2016-01-19 – 2016-01-21 (×9): 3 mL via RESPIRATORY_TRACT
  Filled 2016-01-19 (×10): qty 3

## 2016-01-19 MED ORDER — ALBUTEROL SULFATE (2.5 MG/3ML) 0.083% IN NEBU: 2.5000 mg | INHALATION_SOLUTION | RESPIRATORY_TRACT | Status: DC | PRN

## 2016-01-19 MED ORDER — METHYLPREDNISOLONE SODIUM SUCC 125 MG IJ SOLR
60.0000 mg | Freq: Two times a day (BID) | INTRAMUSCULAR | Status: DC
  Administered 2016-01-19 – 2016-01-20 (×2): 60 mg via INTRAVENOUS
  Filled 2016-01-19: qty 2

## 2016-01-19 NOTE — Care Management Note (Signed)
Case Management Note  Patient Details  Name: Ernest [name] MRN: [id number] Date of Birth: 1955-12-21  Subjective/Objective:       Admitted with  COPD exacerbation, hx of  home oxygen 3 L/m, NSCLC, CAD, HLD, anxiety, depression, GERD, OSA, HTN and DM. From home with wife. Independent with ADL's PTA. PCP: [name]   Action/Plan: Return to home when medically stable. CM to f/u with disposition needs.  Expected Discharge Date:                  Expected Discharge Plan:  Home/Self Care  In-House Referral:     Discharge planning Services     Post Acute Care Choice:  Durable Medical Equipment, Resumption of Svcs/PTA Provider Choice offered to:     DME Arranged:  Oxygen DME Agency:  [facility]  HH Arranged:    [place] Agency:     Status of Service:  Completed, signed off  Medicare Important Message Given:    Date Medicare IM Given:    Medicare IM give by:    Date Additional Medicare IM Given:    Additional Medicare Important Message give by:     If discussed at [place] of Stay Meetings, dates discussed:    Additional Comments:  Sharin Mons, RN Durel Salts  [phone]  01/19/2016, 4:00 PM

## 2016-01-19 NOTE — Progress Notes (Signed)
PROGRESS NOTE    Ernest [name]  UXN:[id number]  DOB: 08-07-1956  DOA: 01/18/2016 PCP: [name], MD Outpatient Specialists: Oncology: [facility]. [facility] course: 60 year old male patient with history of COPD -oxygen and steroid dependent, chronic respiratory failure on home oxygen 3 L/m, NSCLC which appears to be possibly recurring with new nodule in right lung that needs further workup by oncology, CAD, HLD, anxiety, depression, GERD, OSA, HTN and DM presented to [facility] ED on 01/18/16 with worsening dyspnea. Gives history of mild intermittent cough with white sputum for 2 weeks. Denies flulike illness, fevers or sick contacts. Initially required CPAP and EMS gave patient a dose of Solu-Medrol en route to ED. Improved. Admitted for COPD exacerbation.  Assessment & Plan:   COPD exacerbation - Treating with oxygen, bronchodilator nebulizations, oral prednisone, PO abx and home meds.  - Had improved in ED but dyspnea and wheezing overnight and this morning. - Place on scheduled nebs and change steroids to IV Solu-Medrol.  Acute on chronic hypoxic respiratory failure - Oxygen supplements. Management as above.  Essential hypertension - Controlled.  Uncontrolled DM 2 - Precipitated by steroids. SSI.  NSCLC - Patient follow-up with oncology.  CAD - Asymptomatic.   Thrombocytopenia - Appears chronic.  DVT prophylaxis: Lovenox  Code Status: Full  Family Communication: None at bedside  Disposition Plan: DC home when medically stable. Not medically stable for discharge.  Consultants: None   Procedures: None   Antimicrobials:  Doxycycline.  Subjective: Overnight wheezing and dyspnea.  Objective: Filed Vitals:   01/19/16 0446 01/19/16 0524 01/19/16 0910 01/19/16 1216  BP:  128/52 130/52 125/61  Pulse:  65 73 61  Temp:  97.5 F (36.4 C)  97.7 F (36.5 C)  TempSrc:  Oral  Oral  Resp:  18  18  Height:      Weight:      SpO2: 95% 97% 97% 100%     Intake/Output Summary (Last 24 hours) at 01/19/16 1635 Last data filed at 01/19/16 1122  Gross per 24 hour  Intake      0 ml  Output    775 ml  Net   -775 ml   Filed Weights   01/18/16 0234  Weight: 93.441 kg (206 lb)    Exam:  General exam: Moderately built and overweight pleasant middle-aged male lying propped up in bed undergoing nebulization this morning.  Respiratory system: Worsened compared to yesterday morning. Reduced breath sounds with scattered bilateral medium pitched expiratory rhonchi. No increased work of breathing. Cardiovascular system: S1 & S2 heard, RRR. No JVD, murmurs, gallops, clicks or pedal edema. Gastrointestinal system: Abdomen is nondistended, soft and nontender. Normal bowel sounds heard. Central nervous system: Alert and oriented. No focal neurological deficits. Extremities: Symmetric 5 x 5 power.   Data Reviewed: Basic Metabolic Panel:  Recent Labs Lab 01/18/16 0236  NA 139  K 4.3  CL 98*  CO2 26  GLUCOSE 152*  BUN 9  CREATININE 1.07  CALCIUM 9.1   Liver Function Tests:  Recent Labs Lab 01/18/16 0236  AST 26  ALT 24  ALKPHOS 113  BILITOT 0.3  PROT 6.1*  ALBUMIN 3.6   No results for input(s): LIPASE, AMYLASE in the last 168 hours. No results for input(s): AMMONIA in the last 168 hours. CBC:  Recent Labs Lab 01/18/16 0236  WBC 13.8*  NEUTROABS 10.2*  HGB 14.2  HCT 44.4  MCV 92.7  PLT 130*   Cardiac Enzymes:  Recent Labs Lab 01/18/16 0236  TROPONINI 0.03   BNP (last 3 results) No results for input(s): PROBNP in the last 8760 hours. CBG:  Recent Labs Lab 01/18/16 1118 01/18/16 1710 01/18/16 2201 01/19/16 0829 01/19/16 1201  GLUCAP 260* 200* 181* 159* 164*    No results found for this or any previous visit (from the past 240 hour(s)).       Studies: Dg Chest Portable 1 View  01/18/2016  CLINICAL DATA:  Acute onset of cough, congestion, shortness of breath and wheezing. Initial encounter. EXAM:  PORTABLE CHEST 1 VIEW COMPARISON:  Chest radiograph performed 11/25/2015, and CT of the chest performed 01/12/2016 FINDINGS: The lungs are well-aerated. The previously noted right lower lobe nodule is difficult to characterize on radiograph. Postsurgical and post therapy change is again noted at the left lung apex. There is no evidence of pleural effusion or pneumothorax. The cardiomediastinal silhouette is mildly enlarged. No acute osseous abnormalities are seen. IMPRESSION: 1. No definite acute focal airspace consolidation seen. 2. Previously noted right lower lobe nodule is not well characterized on radiograph. Postsurgical and post therapy change again noted at the left lung apex. 3. Mild cardiomegaly. Electronically Signed   By: Garald Balding M.D.   On: 01/18/2016 03:15        Scheduled Meds: . antiseptic oral rinse  7 mL Mouth Rinse BID  . ARIPiprazole  5 mg Oral QHS  . aspirin EC  81 mg Oral Daily  . atorvastatin  20 mg Oral Daily  . azithromycin  250 mg Oral Daily  . clopidogrel  75 mg Oral Daily  . enoxaparin (LOVENOX) injection  40 mg Subcutaneous Q24H  . FLUoxetine  40 mg Oral q morning - 10a  . gabapentin  100 mg Oral Daily  . insulin aspart  0-5 Units Subcutaneous QHS  . insulin aspart  0-9 Units Subcutaneous TID WC  . ipratropium-albuterol  3 mL Nebulization Q6H  . isosorbide mononitrate  60 mg Oral Daily  . metoprolol succinate  12.5 mg Oral Daily  . mirtazapine  45 mg Oral QHS  . mometasone-formoterol  2 puff Inhalation BID  . morphine  30 mg Oral Q12H  . pantoprazole  80 mg Oral Q1200  . potassium chloride SA  20 mEq Oral q morning - 10a  . predniSONE  50 mg Oral Q breakfast  . Suvorexant  1 tablet Oral QHS  . theophylline  400 mg Oral Daily   Continuous Infusions:   Principal Problem:   COPD exacerbation (HCC) Active Problems:   Essential hypertension   COPD with asthma ([place])   Squamous cell lung cancer (HCC)   Chronic pain syndrome   Diabetes mellitus  without complication ([place])    Time spent: 20 minutes.    Vernell Leep, MD, FACP, FHM. [facility] Pager [phone]  If 7PM-7AM, please contact night-coverage [email or web address] Password [facility] 01/19/2016, 4:35 PM    LOS: 1 day

## 2016-01-19 NOTE — Progress Notes (Signed)
Inpatient Diabetes Program Recommendations  AACE/ADA: New Consensus Statement on Inpatient Glycemic Control (2015)  Target Ranges:  Prepandial:   less than 140 mg/dL      Peak postprandial:   less than 180 mg/dL (1-2 hours)      Critically ill patients:  140 - 180 mg/dL   Review of Glycemic Control  Diabetes history: None noted-HgbA1C of 6.8% (minimally above the 'borderline' /'at risk for dm' Outpatient Diabetes medications: none Current orders for Inpatient glycemic control: sensitive correction tidwc and HS correction scale.  Inpatient Diabetes Program Recommendations:    Fasting glucose levels are controlled. Prednisone mostly affects the post-prandial cbg's as before lunch and before dinner. May want to consider adding low dose meal coverage while on prednisone doses greater than 10 mg/day. Please consider: Low dose meal coveraage of 2 units tidwc in addition to the sensitive and HS correction scale.  Thank you Rosita Kea, RN, MSN, CDE  Diabetes Inpatient Program Office: [phone] Pager: [phone] 8:00 am to 5:00 pm

## 2016-01-19 NOTE — Progress Notes (Signed)
Patient stated he is dependent on 3L of 02 at home and uses [place]

## 2016-01-20 LAB — GLUCOSE, CAPILLARY
Glucose-Capillary: 292 mg/dL — ABNORMAL HIGH (ref 65–99)
Glucose-Capillary: 254 mg/dL — ABNORMAL HIGH (ref 65–99)
Glucose-Capillary: 326 mg/dL — ABNORMAL HIGH (ref 65–99)
Glucose-Capillary: 297 mg/dL — ABNORMAL HIGH (ref 65–99)
Glucose-Capillary: 271 mg/dL — ABNORMAL HIGH (ref 65–99)

## 2016-01-20 MED ORDER — METHYLPREDNISOLONE SODIUM SUCC 40 MG IJ SOLR
40.0000 mg | Freq: Two times a day (BID) | INTRAMUSCULAR | Status: DC
  Administered 2016-01-20 – 2016-01-21 (×2): 40 mg via INTRAVENOUS
  Filled 2016-01-20 (×2): qty 1

## 2016-01-20 NOTE — Progress Notes (Signed)
PROGRESS NOTE    Ernest [name]  QMG:[id number]  DOB: [birth year]  DOA: 01/18/2016 PCP: [name], MD Outpatient Specialists: Oncology: Dr[facility] course: 60 year old male patient with history of COPD -oxygen and steroid dependent, chronic respiratory failure on home oxygen 3 L/m, NSCLC which appears to be possibly recurring with new nodule in right lung that needs further workup by oncology, CAD, HLD, anxiety, depression, GERD, OSA, HTN and DM presented to [facility] ED on 01/18/16 with worsening dyspnea. Gives history of mild intermittent cough with white sputum for 2 weeks. Denies flulike illness, fevers or sick contacts. Initially required CPAP and EMS gave patient a dose of Solu-Medrol en route to ED. Improved. Admitted for COPD exacerbation.  Assessment & Plan:   COPD exacerbation - Treating with oxygen, bronchodilator nebulizations, oral prednisone, PO abx and home meds.  - Had improved in ED but dyspnea and wheezing overnight and this morning. - Place on scheduled nebs and change steroids to IV Solu-Medrol. - Improving. Reduce Solu-Medrol.  Acute on chronic hypoxic respiratory failure - Oxygen supplements. Management as above.  Essential hypertension - Controlled.  Uncontrolled DM 2 - Precipitated by steroids. SSI.  NSCLC - Patient follow-up with oncology.  CAD - Asymptomatic.   Thrombocytopenia - Appears chronic.  DVT prophylaxis: Lovenox  Code Status: Full  Family Communication: None at bedside  Disposition Plan: DC home when medically stable. Possible DC home 2/26.  Consultants: None   Procedures: None   Antimicrobials:  Doxycycline.  Subjective: Dyspnea and wheezing have improved.  Objective: Filed Vitals:   01/19/16 2151 01/20/16 0709 01/20/16 1003 01/20/16 1500  BP:  157/63 150/54 148/62  Pulse:  68 70 72  Temp: 98.1 F (36.7 C) 97.8 F (36.6 C)  98.4 F (36.9 C)  TempSrc: Oral Oral  Oral  Resp: '19 20  20  '$ Height:       Weight:      SpO2: 98% 98%  98%    Intake/Output Summary (Last 24 hours) at 01/20/16 1835 Last data filed at 01/20/16 1555  Gross per 24 hour  Intake    680 ml  Output    450 ml  Net    230 ml   Filed Weights   01/18/16 0234  Weight: 93.441 kg (206 lb)    Exam:  General exam: Moderately built and overweight pleasant middle-aged male lying propped up in bed without distress. Respiratory system: Improved breath sounds. Essentially clear except occasional rhonchi. No increased work of breathing. Cardiovascular system: S1 & S2 heard, RRR. No JVD, murmurs, gallops, clicks or pedal edema. Gastrointestinal system: Abdomen is nondistended, soft and nontender. Normal bowel sounds heard. Central nervous system: Alert and oriented. No focal neurological deficits. Extremities: Symmetric 5 x 5 power.   Data Reviewed: Basic Metabolic Panel:  Recent Labs Lab 01/18/16 0236  NA 139  K 4.3  CL 98*  CO2 26  GLUCOSE 152*  BUN 9  CREATININE 1.07  CALCIUM 9.1   Liver Function Tests:  Recent Labs Lab 01/18/16 0236  AST 26  ALT 24  ALKPHOS 113  BILITOT 0.3  PROT 6.1*  ALBUMIN 3.6   No results for input(s): LIPASE, AMYLASE in the last 168 hours. No results for input(s): AMMONIA in the last 168 hours. CBC:  Recent Labs Lab 01/18/16 0236  WBC 13.8*  NEUTROABS 10.2*  HGB 14.2  HCT 44.4  MCV 92.7  PLT 130*   Cardiac Enzymes:  Recent Labs Lab 01/18/16 0236  TROPONINI 0.03  BNP (last 3 results) No results for input(s): PROBNP in the last 8760 hours. CBG:  Recent Labs Lab 01/19/16 2146 01/20/16 0720 01/20/16 0750 01/20/16 1247 01/20/16 1704  GLUCAP 289* 292* 254* 326* 297*    No results found for this or any previous visit (from the past 240 hour(s)).       Studies: No results found.      Scheduled Meds: . antiseptic oral rinse  7 mL Mouth Rinse BID  . ARIPiprazole  5 mg Oral QHS  . aspirin EC  81 mg Oral Daily  . atorvastatin  20 mg  Oral Daily  . azithromycin  250 mg Oral Daily  . clopidogrel  75 mg Oral Daily  . enoxaparin (LOVENOX) injection  40 mg Subcutaneous Q24H  . FLUoxetine  40 mg Oral q morning - 10a  . gabapentin  100 mg Oral Daily  . insulin aspart  0-5 Units Subcutaneous QHS  . insulin aspart  0-9 Units Subcutaneous TID WC  . ipratropium-albuterol  3 mL Nebulization Q4H  . isosorbide mononitrate  60 mg Oral Daily  . methylPREDNISolone (SOLU-MEDROL) injection  40 mg Intravenous Q12H  . metoprolol succinate  12.5 mg Oral Daily  . mirtazapine  45 mg Oral QHS  . mometasone-formoterol  2 puff Inhalation BID  . morphine  30 mg Oral Q12H  . pantoprazole  80 mg Oral Q1200  . potassium chloride SA  20 mEq Oral q morning - 10a  . Suvorexant  1 tablet Oral QHS  . theophylline  400 mg Oral Daily   Continuous Infusions:   Principal Problem:   COPD exacerbation (HCC) Active Problems:   Essential hypertension   COPD with asthma ([place])   Squamous cell lung cancer (HCC)   Chronic pain syndrome   Diabetes mellitus without complication ([place])    Time spent: 20 minutes.    Vernell Leep, MD, FACP, FHM. [facility] Pager [phone]  If 7PM-7AM, please contact night-coverage [email or web address] Password [facility] 01/20/2016, 6:35 PM    LOS: 2 days

## 2016-01-21 DIAGNOSIS — J9621 Acute and chronic respiratory failure with hypoxia: Secondary | ICD-10-CM

## 2016-01-21 LAB — GLUCOSE, CAPILLARY
GLUCOSE-CAPILLARY: 302 mg/dL — AB (ref 65–99)
Glucose-Capillary: 276 mg/dL — ABNORMAL HIGH (ref 65–99)

## 2016-01-21 MED ORDER — PREDNISONE 20 MG PO TABS
40.0000 mg | ORAL_TABLET | Freq: Every day | ORAL | Status: DC
  Administered 2016-01-21: 40 mg via ORAL
  Filled 2016-01-21: qty 2

## 2016-01-21 MED ORDER — PREDNISONE 10 MG PO TABS: ORAL_TABLET | ORAL | Status: DC

## 2016-01-21 MED ORDER — AZITHROMYCIN 250 MG PO TABS: 250.0000 mg | ORAL_TABLET | Freq: Once | ORAL | Status: DC

## 2016-01-21 NOTE — Discharge Instructions (Signed)
Chronic Obstructive Pulmonary Disease Chronic obstructive pulmonary disease (COPD) is a common lung condition in which airflow from the lungs is limited. COPD is a general term that can be used to describe many different lung problems that limit airflow, including both chronic bronchitis and emphysema. If you have COPD, your lung function will probably never return to normal, but there are measures you can take to improve lung function and make yourself feel better. CAUSES   Smoking (common).  Exposure to secondhand smoke.  Genetic problems.  Chronic inflammatory lung diseases or recurrent infections. SYMPTOMS  Shortness of breath, especially with physical activity.  Deep, persistent (chronic) cough with a large amount of thick mucus.  Wheezing.  Rapid breaths (tachypnea).  Gray or bluish discoloration (cyanosis) of the skin, especially in your fingers, toes, or lips.  Fatigue.  Weight loss.  Frequent infections or episodes when breathing symptoms become much worse (exacerbations).  Chest tightness. DIAGNOSIS Your health care provider will take a medical history and perform a physical examination to diagnose COPD. Additional tests for COPD may include:  Lung (pulmonary) function tests.  Chest X-ray.  CT scan.  Blood tests. TREATMENT  Treatment for COPD may include:  Inhaler and nebulizer medicines. These help manage the symptoms of COPD and make your breathing more comfortable.  Supplemental oxygen. Supplemental oxygen is only helpful if you have a low oxygen level in your blood.  Exercise and physical activity. These are beneficial for nearly all people with COPD.  Lung surgery or transplant.  Nutrition therapy to gain weight, if you are underweight.  Pulmonary rehabilitation. This may involve working with a team of health care providers and specialists, such as respiratory, occupational, and physical therapists. HOME CARE INSTRUCTIONS  Take all medicines  (inhaled or pills) as directed by your health care provider.  Avoid over-the-counter medicines or cough syrups that dry up your airway (such as antihistamines) and slow down the elimination of secretions unless instructed otherwise by your health care provider.  If you are a smoker, the most important thing that you can do is stop smoking. Continuing to smoke will cause further lung damage and breathing trouble. Ask your health care provider for help with quitting smoking. He or she can direct you to community resources or hospitals that provide support.  Avoid exposure to irritants such as smoke, chemicals, and fumes that aggravate your breathing.  Use oxygen therapy and pulmonary rehabilitation if directed by your health care provider. If you require home oxygen therapy, ask your health care provider whether you should purchase a pulse oximeter to measure your oxygen level at home.  Avoid contact with individuals who have a contagious illness.  Avoid extreme temperature and humidity changes.  Eat healthy foods. Eating smaller, more frequent meals and resting before meals may help you maintain your strength.  Stay active, but balance activity with periods of rest. Exercise and physical activity will help you maintain your ability to do things you want to do.  Preventing infection and hospitalization is very important when you have COPD. Make sure to receive all the vaccines your health care provider recommends, especially the pneumococcal and influenza vaccines. Ask your health care provider whether you need a pneumonia vaccine.  Learn and use relaxation techniques to manage stress.  Learn and use controlled breathing techniques as directed by your health care provider. Controlled breathing techniques include:  Pursed lip breathing. Start by breathing in (inhaling) through your nose for 1 second. Then, purse your lips as if you were   going to whistle and breathe out (exhale) through the  pursed lips for 2 seconds.  Diaphragmatic breathing. Start by putting one hand on your abdomen just above your waist. Inhale slowly through your nose. The hand on your abdomen should move out. Then purse your lips and exhale slowly. You should be able to feel the hand on your abdomen moving in as you exhale.  Learn and use controlled coughing to clear mucus from your lungs. Controlled coughing is a series of short, progressive coughs. The steps of controlled coughing are: 1. Lean your head slightly forward. 2. Breathe in deeply using diaphragmatic breathing. 3. Try to hold your breath for 3 seconds. 4. Keep your mouth slightly open while coughing twice. 5. Spit any mucus out into a tissue. 6. Rest and repeat the steps once or twice as needed. SEEK MEDICAL CARE IF:  You are coughing up more mucus than usual.  There is a change in the color or thickness of your mucus.  Your breathing is more labored than usual.  Your breathing is faster than usual. SEEK IMMEDIATE MEDICAL CARE IF:  You have shortness of breath while you are resting.  You have shortness of breath that prevents you from:  Being able to talk.  Performing your usual physical activities.  You have chest pain lasting longer than 5 minutes.  Your skin color is more cyanotic than usual.  You measure low oxygen saturations for longer than 5 minutes with a pulse oximeter. MAKE SURE YOU:  Understand these instructions.  Will watch your condition.  Will get help right away if you are not doing well or get worse.   This information is not intended to replace advice given to you by your health care provider. Make sure you discuss any questions you have with your health care provider.   Document Released: 08/21/2005 Document Revised: 12/02/2014 Document Reviewed: 07/08/2013 [employer] Interactive Patient Education 2016 [employer]  

## 2016-01-21 NOTE — Discharge Summary (Signed)
Physician Discharge Summary  Ernest [name]  QVZ:[id number]  DOB: Mar 25, 1956  DOA: 01/18/2016  PCP: [name], MD  Admit date: 01/18/2016 Discharge date: 01/21/2016  Time spent:  Greater than 30 minutes  Recommendations for Outpatient Follow-up:  1.  Dr. [name], PCP in 5 days with repeat labs (CBC & BMP). 2. Dr. Baird Lyons, Pulmonology in 2 weeks.  Discharge Diagnoses:  Principal Problem:   COPD exacerbation ([place]) Active Problems:   Essential hypertension   COPD with asthma ([place])   Squamous cell lung cancer ([place])   Chronic pain syndrome   Diabetes mellitus without complication ([place])   Discharge Condition: Improved & Stable  Diet recommendation:  Heart healthy and diabetic diet.  Filed Weights   01/18/16 0234  Weight: 93.441 kg (206 lb)    History of present illness:  60 year old male patient with history of COPD -oxygen and steroid dependent, chronic respiratory failure on home oxygen 3 L/m, NSCLC which appears to be possibly recurring with new nodule in right lung that needs further workup by oncology, CAD, HLD, anxiety, depression, GERD, OSA, HTN and DM presented to [facility] ED on 01/18/16 with worsening dyspnea. Gives history of mild intermittent cough with white sputum for 2 weeks. Denies flulike illness, fevers or sick contacts. Initially required CPAP and EMS gave patient a dose of Solu-Medrol en route to ED. Improved. Admitted for COPD exacerbation.  Hospital Course:   COPD exacerbation - Treated with oxygen, bronchodilator nebulizations, oral prednisone, PO abx and home meds.  - Had improved in ED but dyspnea and wheezing got slightly worse the next day - Place on scheduled nebs and change steroids to IV Solu-Medrol. -  Patient has made steady improvement over the last 48 hours. IV Solu-Medrol was decreased. He states that his breathing is back to baseline for the last day or so. He will be transitioned to oral prednisone taper, complete 5 days of  azithromycin at discharge. -  Close outpatient follow-up with PCP and pulmonology. - States that he cannot afford and has not been taking Stialto Respimat.  Acute on chronic hypoxic respiratory failure - Oxygen supplements. Management as above. Acute respiratory failure resolved.  Essential hypertension - Controlled.  Uncontrolled DM 2 - Precipitated by steroids. SSI in the hospital. Diet controlled at home. Hemoglobin A1c 6.8. Blood sugars should improve with tapering off of steroids.  NSCLC - OP follow-up with oncology , Dr. Curt Bears. Recent CT shows enlarging RLL pulmonary nodule concerning for metastasis.  CAD - Asymptomatic.   Thrombocytopenia - Appears chronic.  Anxiety & depression -  Patient is on polypharmacy which can be re-evaluated during outpatient follow-up with PCP.   Chronic pain syndrome     Consultants: None   Procedures: None    Discharge Exam:  Complaints: Denies complaints. States that his breathing is at baseline.  Filed Vitals:   01/21/16 0817 01/21/16 0848 01/21/16 1303 01/21/16 1312  BP: 141/57   139/63  Pulse: 63   62  Temp:      TempSrc:      Resp:    20  Height:      Weight:      SpO2:  98% 100% 100%    General exam: Moderately built and overweight pleasant middle-aged male lying propped up in bed without distress. Respiratory system:  Clear to auscultation. No increased work of breathing. Cardiovascular system: S1 & S2 heard, RRR. No JVD, murmurs, gallops, clicks or pedal edema. Gastrointestinal system: Abdomen is nondistended, soft and nontender. Normal  bowel sounds heard. Central nervous system: Alert and oriented. No focal neurological deficits. Extremities: Symmetric 5 x 5 power.  Discharge Instructions      Discharge Instructions    Call MD for:  difficulty breathing, headache or visual disturbances    Complete by:  As directed      Call MD for:  extreme fatigue    Complete by:  As directed      Call MD  for:  persistant dizziness or light-headedness    Complete by:  As directed      Call MD for:  persistant nausea and vomiting    Complete by:  As directed      Call MD for:  severe uncontrolled pain    Complete by:  As directed      Call MD for:  temperature >100.4    Complete by:  As directed      Diet - low sodium heart healthy    Complete by:  As directed      Diet Carb Modified    Complete by:  As directed      Increase activity slowly    Complete by:  As directed             Medication List    STOP taking these medications        STIOLTO RESPIMAT 2.5-2.5 MCG/ACT Aers  Generic drug:  Tiotropium Bromide-Olodaterol      TAKE these medications        arformoterol 15 MCG/2ML Nebu  Commonly known as:  BROVANA  Take 2 mLs (15 mcg total) by nebulization 2 (two) times daily.     ARIPiprazole 5 MG tablet  Commonly known as:  ABILIFY  Take 1 tablet (5 mg total) by mouth at bedtime.     aspirin 81 MG EC tablet  TAKE 1 TABLET BY MOUTH EVERY DAY     atorvastatin 20 MG tablet  Commonly known as:  LIPITOR  TAKE 1 TABLET EVERY DAY     azithromycin 250 MG tablet  Commonly known as:  ZITHROMAX  Take 1 tablet (250 mg total) by mouth once.  Start taking on:  01/22/2016     BELSOMRA 20 MG Tabs  Generic drug:  Suvorexant  Take 1 tablet by mouth at bedtime. Take 1 tab by mouth daily at bed time for sleep prn     clonazePAM 1 MG tablet  Commonly known as:  KLONOPIN  Take 2 mg by mouth at bedtime as needed for anxiety. Reported on 01/16/2016     clopidogrel 75 MG tablet  Commonly known as:  PLAVIX  Take 1 tablet (75 mg total) by mouth daily.     esomeprazole 40 MG capsule  Commonly known as:  NEXIUM  Take 40 mg by mouth every morning.     FLUoxetine 40 MG capsule  Commonly known as:  PROZAC  Take 40 mg by mouth every morning.     Fluticasone-Salmeterol 500-50 MCG/DOSE Aepb  Commonly known as:  ADVAIR DISKUS  1 puff then rinse mouth, twice daily maintenance      gabapentin 100 MG capsule  Commonly known as:  NEURONTIN  Take 100 mg by mouth daily.     isosorbide mononitrate 60 MG 24 hr tablet  Commonly known as:  IMDUR  Take 60 mg by mouth daily.     methocarbamol 500 MG tablet  Commonly known as:  ROBAXIN  Take 500 mg by mouth 2 (two) times daily as needed for muscle spasms.  Reported on 01/16/2016     metoprolol succinate 25 MG 24 hr tablet  Commonly known as:  TOPROL-XL  TAKE 1/2 TABLET BY MOUTH DAILY     mirtazapine 45 MG tablet  Commonly known as:  REMERON  Take 1 tablet (45 mg total) by mouth at bedtime.     morphine 30 MG 12 hr tablet  Commonly known as:  MS CONTIN  Take 1 tablet (30 mg total) by mouth every 12 (twelve) hours.     morphine 15 MG tablet  Commonly known as:  MSIR  Take 1 tablet (15 mg total) by mouth every 4 (four) hours as needed for severe pain.     nitroGLYCERIN 0.4 MG SL tablet  Commonly known as:  NITROSTAT  Place 1 tablet (0.4 mg total) under the tongue every 5 (five) minutes as needed for chest pain.     OXYGEN  Inhale 2 L into the lungs at bedtime as needed.     potassium chloride SA 20 MEQ tablet  Commonly known as:  K-DUR,KLOR-CON  Take 1 tablet (20 mEq total) by mouth every morning. Reported on 12/07/2015     predniSONE 5 MG tablet  Commonly known as:  DELTASONE  Take 5 mg by mouth daily.     predniSONE 10 MG tablet  Commonly known as:  DELTASONE  Take 4 tabs daily for 2 days, then 3 tabs daily for 3 days, then 2 tabs daily for 3 days, then 1 tab daily for 3 days, then stop.  Start taking on:  01/22/2016     theophylline 400 MG 24 hr tablet  Commonly known as:  UNIPHYL  Take 400 mg by mouth daily.     VENTOLIN HFA 108 (90 Base) MCG/ACT inhaler  Generic drug:  albuterol  INHALE 2 PUFFS INTO THE LUNGS 4 TIMES DAILY AS NEEDED FOR WHEEZING     albuterol (2.5 MG/3ML) 0.083% nebulizer solution  Commonly known as:  PROVENTIL  INHALE 1 VIAL IN NEBULIZER EVERY 4 HOURS AS NEEDED FOR WHEEZING OR  SHORTNESS OF BREATH       Follow-up Information    Follow up with [name], MD. Schedule an appointment as soon as possible for a visit in 5 days.   Specialty:  Internal Medicine   Why:   to be seen with repeat labs (CBC & BMP).   Contact information:   [address] [place] [address] [phone]       Follow up with Deneise Lever, MD. Schedule an appointment as soon as possible for a visit in 2 weeks.   Specialty:  Pulmonary Disease   Contact information:   [place] [address] [phone]       Get Medicines reviewed and adjusted: Please take all your medications with you for your next visit with your Primary MD  Please request your Primary MD to go over all hospital tests and procedure/radiological results at the follow up. Please ask your Primary MD to get all Hospital records sent to his/her office.  If you experience worsening of your admission symptoms, develop shortness of breath, life threatening emergency, suicidal or homicidal thoughts you must seek medical attention immediately by calling 911 or calling your MD immediately if symptoms less severe.  You must read complete instructions/literature along with all the possible adverse reactions/side effects for all the Medicines you take and that have been prescribed to you. Take any new Medicines after you have completely understood and accept all  the possible adverse reactions/side effects.   Do not drive when taking pain medications.   Do not take more than prescribed Pain, Sleep and Anxiety Medications  Special Instructions: If you have smoked or chewed Tobacco in the last 2 yrs please stop smoking, stop any regular Alcohol and or any Recreational drug use.  Wear Seat belts while driving.  Please note  You were cared for by a hospitalist during your hospital stay. Once you are discharged, your primary care physician will handle any further medical issues. Please note  that NO REFILLS for any discharge medications will be authorized once you are discharged, as it is imperative that you return to your primary care physician (or establish a relationship with a primary care physician if you do not have one) for your aftercare needs so that they can reassess your need for medications and monitor your lab values.    The results of significant diagnostics from this hospitalization (including imaging, microbiology, ancillary and laboratory) are listed below for reference.    Significant Diagnostic Studies: Ct Chest W Contrast  01/12/2016  CLINICAL DATA:  Lung cancer diagnosed 2014 chemotherapy complete 2015. Subsequent treatment evaluation. EXAM: CT CHEST WITH CONTRAST TECHNIQUE: Multidetector CT imaging of the chest was performed during intravenous contrast administration. CONTRAST:  64m OMNIPAQUE IOHEXOL 300 MG/ML  SOLN COMPARISON:  CT 09/06/2015 FINDINGS: Mediastinum/Nodes: No axillary or supraclavicular lymphadenopathy. No mediastinal or hilar lymphadenopathy. Esophagus normal. There is a lymph node along the descending thoracic aorta measuring 7 mm (image 44, series 2) which is not changed from prior. Lungs/Pleura: Consolidative pattern in the LEFT upper lobe extending from the hilum is not changed from prior. No LEFT pulmonary nodules. Within the RIGHT lower lobe new 19 mm x 15 mm oblong nodule (image 36, series 5). This is at the same site of small 5 mm ill-defined nodule on comparison CT. Upper abdomen: All no focal hepatic lesion. This low-density lesion in the inferior aspect of the spleen which is not changed. Adrenal glands normal. Atherosclerotic calcification aorta. Musculoskeletal: No aggressive osseous lesion. IMPRESSION: 1. Significant enlargement of RIGHT lower lobe nodule is concerning for a pulmonary metastasis. Recommend tissue sampling with bronchoscopy or percutaneous biopsy. 2. Stable postsurgical and post therapy change in the LEFT upper lobe. 3. Stable  small mediastinal lymph nodes. Electronically Signed   By: SSuzy BouchardM.D.   On: 01/12/2016 10:02   Dg Chest Portable 1 View  01/18/2016  CLINICAL DATA:  Acute onset of cough, congestion, shortness of breath and wheezing. Initial encounter. EXAM: PORTABLE CHEST 1 VIEW COMPARISON:  Chest radiograph performed 11/25/2015, and CT of the chest performed 01/12/2016 FINDINGS: The lungs are well-aerated. The previously noted right lower lobe nodule is difficult to characterize on radiograph. Postsurgical and post therapy change is again noted at the left lung apex. There is no evidence of pleural effusion or pneumothorax. The cardiomediastinal silhouette is mildly enlarged. No acute osseous abnormalities are seen. IMPRESSION: 1. No definite acute focal airspace consolidation seen. 2. Previously noted right lower lobe nodule is not well characterized on radiograph. Postsurgical and post therapy change again noted at the left lung apex. 3. Mild cardiomegaly. Electronically Signed   By: JGarald BaldingM.D.   On: 01/18/2016 03:15    Microbiology: No results found for this or any previous visit (from the past 240 hour(s)).   Labs: Basic Metabolic Panel:  Recent Labs Lab 01/18/16 0236  NA 139  K 4.3  CL 98*  CO2 26  GLUCOSE 152*  BUN 9  CREATININE 1.07  CALCIUM 9.1   Liver Function Tests:  Recent Labs Lab 01/18/16 0236  AST 26  ALT 24  ALKPHOS 113  BILITOT 0.3  PROT 6.1*  ALBUMIN 3.6   No results for input(s): LIPASE, AMYLASE in the last 168 hours. No results for input(s): AMMONIA in the last 168 hours. CBC:  Recent Labs Lab 01/18/16 0236  WBC 13.8*  NEUTROABS 10.2*  HGB 14.2  HCT 44.4  MCV 92.7  PLT 130*   Cardiac Enzymes:  Recent Labs Lab 01/18/16 0236  TROPONINI 0.03   BNP: BNP (last 3 results)  Recent Labs  04/11/15 0235 11/24/15 0154 01/18/16 0236  BNP 305.2* 211.9* 41.0    ProBNP (last 3 results) No results for input(s): PROBNP in the last 8760  hours.  CBG:  Recent Labs Lab 01/20/16 1247 01/20/16 1704 01/20/16 2114 01/21/16 0804 01/21/16 1201  GLUCAP 326* 297* 271* 302* 276*       Signed:  Vernell Leep, MD, FACP, FHM. [facility] Pager [phone]  If 7PM-7AM, please contact night-coverage [email or web address] Password [email or web address] 01/21/2016, 1:56 PM

## 2016-01-21 NOTE — Progress Notes (Signed)
Nsg Discharge Note  Admit Date:  01/18/2016 Discharge date: 01/21/2016   Jerrol Banana Sivertson to be D/C'd Home per MD order.  AVS completed.  Copy for chart, and copy for patient signed, and dated. Patient/caregiver able to verbalize understanding.  Discharge Medication:   Medication List    STOP taking these medications        STIOLTO RESPIMAT 2.5-2.5 MCG/ACT Aers  Generic drug:  Tiotropium Bromide-Olodaterol      TAKE these medications        arformoterol 15 MCG/2ML Nebu  Commonly known as:  BROVANA  Take 2 mLs (15 mcg total) by nebulization 2 (two) times daily.     ARIPiprazole 5 MG tablet  Commonly known as:  ABILIFY  Take 1 tablet (5 mg total) by mouth at bedtime.     aspirin 81 MG EC tablet  TAKE 1 TABLET BY MOUTH EVERY DAY     atorvastatin 20 MG tablet  Commonly known as:  LIPITOR  TAKE 1 TABLET EVERY DAY     azithromycin 250 MG tablet  Commonly known as:  ZITHROMAX  Take 1 tablet (250 mg total) by mouth once.  Start taking on:  01/22/2016     BELSOMRA 20 MG Tabs  Generic drug:  Suvorexant  Take 1 tablet by mouth at bedtime. Take 1 tab by mouth daily at bed time for sleep prn     clonazePAM 1 MG tablet  Commonly known as:  KLONOPIN  Take 2 mg by mouth at bedtime as needed for anxiety. Reported on 01/16/2016     clopidogrel 75 MG tablet  Commonly known as:  PLAVIX  Take 1 tablet (75 mg total) by mouth daily.     esomeprazole 40 MG capsule  Commonly known as:  NEXIUM  Take 40 mg by mouth every morning.     FLUoxetine 40 MG capsule  Commonly known as:  PROZAC  Take 40 mg by mouth every morning.     Fluticasone-Salmeterol 500-50 MCG/DOSE Aepb  Commonly known as:  ADVAIR DISKUS  1 puff then rinse mouth, twice daily maintenance     gabapentin 100 MG capsule  Commonly known as:  NEURONTIN  Take 100 mg by mouth daily.     isosorbide mononitrate 60 MG 24 hr tablet  Commonly known as:  IMDUR  Take 60 mg by mouth daily.     methocarbamol 500 MG tablet   Commonly known as:  ROBAXIN  Take 500 mg by mouth 2 (two) times daily as needed for muscle spasms. Reported on 01/16/2016     metoprolol succinate 25 MG 24 hr tablet  Commonly known as:  TOPROL-XL  TAKE 1/2 TABLET BY MOUTH DAILY     mirtazapine 45 MG tablet  Commonly known as:  REMERON  Take 1 tablet (45 mg total) by mouth at bedtime.     morphine 30 MG 12 hr tablet  Commonly known as:  MS CONTIN  Take 1 tablet (30 mg total) by mouth every 12 (twelve) hours.     morphine 15 MG tablet  Commonly known as:  MSIR  Take 1 tablet (15 mg total) by mouth every 4 (four) hours as needed for severe pain.     nitroGLYCERIN 0.4 MG SL tablet  Commonly known as:  NITROSTAT  Place 1 tablet (0.4 mg total) under the tongue every 5 (five) minutes as needed for chest pain.     OXYGEN  Inhale 2 L into the lungs at bedtime as needed.  potassium chloride SA 20 MEQ tablet  Commonly known as:  K-DUR,KLOR-CON  Take 1 tablet (20 mEq total) by mouth every morning. Reported on 12/07/2015     predniSONE 5 MG tablet  Commonly known as:  DELTASONE  Take 5 mg by mouth daily.     predniSONE 10 MG tablet  Commonly known as:  DELTASONE  Take 4 tabs daily for 2 days, then 3 tabs daily for 3 days, then 2 tabs daily for 3 days, then 1 tab daily for 3 days, then stop.  Start taking on:  01/22/2016     theophylline 400 MG 24 hr tablet  Commonly known as:  UNIPHYL  Take 400 mg by mouth daily.     VENTOLIN HFA 108 (90 Base) MCG/ACT inhaler  Generic drug:  albuterol  INHALE 2 PUFFS INTO THE LUNGS 4 TIMES DAILY AS NEEDED FOR WHEEZING     albuterol (2.5 MG/3ML) 0.083% nebulizer solution  Commonly known as:  PROVENTIL  INHALE 1 VIAL IN NEBULIZER EVERY 4 HOURS AS NEEDED FOR WHEEZING OR SHORTNESS OF BREATH        Discharge Assessment: Filed Vitals:   01/21/16 0817 01/21/16 1312  BP: 141/57 139/63  Pulse: 63 62  Temp:    Resp:  20   Skin clean, dry and intact without evidence of skin break down, no  evidence of skin tears noted. IV catheter discontinued intact. Site without signs and symptoms of complications - no redness or edema noted at insertion site, patient denies c/o pain - only slight tenderness at site.  Dressing with slight pressure applied.  D/c Instructions-Education: Discharge instructions given to patient/family with verbalized understanding. D/c education completed with patient/family including follow up instructions, medication list, d/c activities limitations if indicated, with other d/c instructions as indicated by MD - patient able to verbalize understanding, all questions fully answered. Patient instructed to return to ED, call 911, or call MD for any changes in condition.  Patient escorted via [place], and D/C home via private auto.  Willian Donson Margaretha Sheffield, RN 01/21/2016 2:08 PM

## 2016-01-22 ENCOUNTER — Telehealth: Payer: Self-pay | Admitting: Internal Medicine

## 2016-01-22 ENCOUNTER — Other Ambulatory Visit: Payer: Self-pay | Admitting: *Deleted

## 2016-01-22 NOTE — Patient Outreach (Signed)
[facility]) Care Management  01/22/2016  [name] August 28, 1956 [id number]  Transition of Care call (week one, call attempt #1); Noted patients discharge yesterday from acute inpatient hospital visit February 23-26, 2017 for COPD exacerbation.  Pt. was currently active with [facility] when hospitalized on January 18, 2016, and had recently completed a prior transition of care program, ay which time he had been placed in a COPD program.  His last [place] home visit was on Tuesday January 16, 2016.  Pt. was unavailable today for my outreach attempt; HIPPA compliant VM msg was left for him with my contact information.    Plan:    Will re-attempt to contact [name]  tomorrow.  [name], RN, BSN, [employer] [facility] Care Management  [phone]

## 2016-01-22 NOTE — Telephone Encounter (Signed)
Spoke with pt's wife. Pt needs to be seen for a hospital follow up. Pt has been scheduled to see SG on 01/31/16 at 3pm. Nothing further was needed.

## 2016-01-23 ENCOUNTER — Ambulatory Visit (HOSPITAL_COMMUNITY)
Admission: RE | Admit: 2016-01-23 | Discharge: 2016-01-23 | Disposition: A | Discharge status: Home / Self Care | Payer: Commercial Managed Care - HMO | Source: Ambulatory Visit | Attending: Internal Medicine | Admitting: Internal Medicine

## 2016-01-23 ENCOUNTER — Ambulatory Visit: Payer: Commercial Managed Care - HMO | Admitting: Internal Medicine

## 2016-01-23 DIAGNOSIS — I2510 Atherosclerotic heart disease of native coronary artery without angina pectoris: Secondary | ICD-10-CM | POA: Diagnosis not present

## 2016-01-23 DIAGNOSIS — R911 Solitary pulmonary nodule: Secondary | ICD-10-CM | POA: Diagnosis not present

## 2016-01-23 DIAGNOSIS — I700 Atherosclerosis of aorta: Secondary | ICD-10-CM | POA: Insufficient documentation

## 2016-01-23 DIAGNOSIS — C771 Secondary and unspecified malignant neoplasm of intrathoracic lymph nodes: Secondary | ICD-10-CM | POA: Insufficient documentation

## 2016-01-23 DIAGNOSIS — Z923 Personal history of irradiation: Secondary | ICD-10-CM | POA: Insufficient documentation

## 2016-01-23 DIAGNOSIS — K5730 Diverticulosis of large intestine without perforation or abscess without bleeding: Secondary | ICD-10-CM | POA: Diagnosis not present

## 2016-01-23 DIAGNOSIS — C779 Secondary and unspecified malignant neoplasm of lymph node, unspecified: Secondary | ICD-10-CM | POA: Diagnosis not present

## 2016-01-23 DIAGNOSIS — J9601 Acute respiratory failure with hypoxia: Secondary | ICD-10-CM | POA: Diagnosis not present

## 2016-01-23 DIAGNOSIS — C342 Malignant neoplasm of middle lobe, bronchus or lung: Secondary | ICD-10-CM | POA: Diagnosis not present

## 2016-01-23 DIAGNOSIS — C3492 Malignant neoplasm of unspecified part of left bronchus or lung: Secondary | ICD-10-CM | POA: Insufficient documentation

## 2016-01-23 DIAGNOSIS — J449 Chronic obstructive pulmonary disease, unspecified: Secondary | ICD-10-CM | POA: Diagnosis not present

## 2016-01-23 LAB — GLUCOSE, CAPILLARY: Glucose-Capillary: 194 mg/dL — ABNORMAL HIGH (ref 65–99)

## 2016-01-23 MED ORDER — FLUDEOXYGLUCOSE F - 18 (FDG) INJECTION
10.7000 | Freq: Once | INTRAVENOUS | Status: AC | PRN
  Administered 2016-01-23: 10.7 via INTRAVENOUS

## 2016-01-24 ENCOUNTER — Encounter: Payer: Self-pay | Admitting: Internal Medicine

## 2016-01-24 ENCOUNTER — Ambulatory Visit (INDEPENDENT_AMBULATORY_CARE_PROVIDER_SITE_OTHER): Payer: Commercial Managed Care - HMO | Admitting: Internal Medicine

## 2016-01-24 ENCOUNTER — Other Ambulatory Visit: Payer: Self-pay | Admitting: *Deleted

## 2016-01-24 VITALS — BP 136/76 | HR 85 | Temp 98.5°F | Resp 16 | Ht 66.0 in | Wt 204.0 lb

## 2016-01-24 DIAGNOSIS — J449 Chronic obstructive pulmonary disease, unspecified: Secondary | ICD-10-CM | POA: Diagnosis not present

## 2016-01-24 DIAGNOSIS — I10 Essential (primary) hypertension: Secondary | ICD-10-CM

## 2016-01-24 DIAGNOSIS — C3491 Malignant neoplasm of unspecified part of right bronchus or lung: Secondary | ICD-10-CM | POA: Diagnosis not present

## 2016-01-24 DIAGNOSIS — J45909 Unspecified asthma, uncomplicated: Secondary | ICD-10-CM

## 2016-01-24 DIAGNOSIS — E119 Type 2 diabetes mellitus without complications: Secondary | ICD-10-CM | POA: Diagnosis not present

## 2016-01-24 MED ORDER — ROFLUMILAST 500 MCG PO TABS: 500.0000 ug | ORAL_TABLET | Freq: Every day | ORAL | Status: DC

## 2016-01-24 NOTE — Progress Notes (Signed)
Pre visit review using our clinic review tool, if applicable. No additional management support is needed unless otherwise documented below in the visit note. 

## 2016-01-24 NOTE — Patient Instructions (Signed)

## 2016-01-24 NOTE — Progress Notes (Signed)
Subjective:  Patient ID: Ernest [name], male    DOB: 1956/10/25  Age: 60 y.o. MRN: [id number]  CC: Diabetes; Hypertension; and COPD   HPI Aahan Marques Human presents for a hospital follow-up after a recent admission for COPD exacerbation. He complains of frequent exacerbations and persistent symptoms such as cough productive of a scant amount of white to clear phlegm. While he was admitted he was found to have a mass in the right lower lobe on a CT scan and then he underwent a PET scan the day prior to this visit with the lesion being found to be suspicious for metastatic disease with involvement of some lymph nodes.  Outpatient Prescriptions Prior to Visit  Medication Sig Dispense Refill  . albuterol (PROVENTIL) (2.5 MG/3ML) 0.083% nebulizer solution INHALE 1 VIAL IN NEBULIZER EVERY 4 HOURS AS NEEDED FOR WHEEZING OR SHORTNESS OF BREATH 300 mL 2  . arformoterol (BROVANA) 15 MCG/2ML NEBU Take 2 mLs (15 mcg total) by nebulization 2 (two) times daily. 8 mL 0  . ARIPiprazole (ABILIFY) 5 MG tablet Take 1 tablet (5 mg total) by mouth at bedtime.    [name] aspirin 81 MG EC tablet TAKE 1 TABLET BY MOUTH EVERY DAY 30 tablet 5  . atorvastatin (LIPITOR) 20 MG tablet TAKE 1 TABLET EVERY DAY 90 tablet 3  . azithromycin (ZITHROMAX) 250 MG tablet Take 1 tablet (250 mg total) by mouth once. 1 tablet 0  . BELSOMRA 20 MG TABS Take 1 tablet by mouth at bedtime. Take 1 tab by mouth daily at bed time for sleep prn  4  . clonazePAM (KLONOPIN) 1 MG tablet Take 2 mg by mouth at bedtime as needed for anxiety. Reported on 01/16/2016    . clopidogrel (PLAVIX) 75 MG tablet Take 1 tablet (75 mg total) by mouth daily. 90 tablet 3  . esomeprazole (NEXIUM) 40 MG capsule Take 40 mg by mouth every morning.     [name] FLUoxetine (PROZAC) 40 MG capsule Take 40 mg by mouth every morning.    . Fluticasone-Salmeterol (ADVAIR DISKUS) 500-50 MCG/DOSE AEPB 1 puff then rinse mouth, twice daily maintenance 60 each 11  . gabapentin  (NEURONTIN) 100 MG capsule Take 100 mg by mouth daily.     . isosorbide mononitrate (IMDUR) 60 MG 24 hr tablet Take 60 mg by mouth daily.    . methocarbamol (ROBAXIN) 500 MG tablet Take 500 mg by mouth 2 (two) times daily as needed for muscle spasms. Reported on 01/16/2016    . metoprolol succinate (TOPROL-XL) 25 MG 24 hr tablet TAKE 1/2 TABLET BY MOUTH DAILY 30 tablet 3  . mirtazapine (REMERON) 45 MG tablet Take 1 tablet (45 mg total) by mouth at bedtime.    [name] morphine (MS CONTIN) 30 MG 12 hr tablet Take 1 tablet (30 mg total) by mouth every 12 (twelve) hours. 60 tablet 0  . morphine (MSIR) 15 MG tablet Take 1 tablet (15 mg total) by mouth every 4 (four) hours as needed for severe pain. 30 tablet 0  . nitroGLYCERIN (NITROSTAT) 0.4 MG SL tablet Place 1 tablet (0.4 mg total) under the tongue every 5 (five) minutes as needed for chest pain. 25 tablet 3  . OXYGEN Inhale 2 L into the lungs at bedtime as needed.    . potassium chloride SA (K-DUR,KLOR-CON) 20 MEQ tablet Take 1 tablet (20 mEq total) by mouth every morning. Reported on 12/07/2015 90 tablet 1  . predniSONE (DELTASONE) 10 MG tablet Take 4 tabs daily for 2  days, then 3 tabs daily for 3 days, then 2 tabs daily for 3 days, then 1 tab daily for 3 days, then stop. 26 tablet 0  . predniSONE (DELTASONE) 5 MG tablet Take 5 mg by mouth daily.    . theophylline (UNIPHYL) 400 MG 24 hr tablet Take 400 mg by mouth daily.  5  . VENTOLIN HFA 108 (90 BASE) MCG/ACT inhaler INHALE 2 PUFFS INTO THE LUNGS 4 TIMES DAILY AS NEEDED FOR WHEEZING 18 Inhaler 11   Facility-Administered Medications Prior to Visit  Medication Dose Route Frequency Provider Last Rate Last Dose  . DOBUTamine (DOBUTREX) 1,000 mcg/mL in dextrose 5% 250 mL infusion  0-40 mcg/kg/min Intravenous Continuous Fay Records, MD 224.2 mL/hr at 06/12/15 1117 40 mcg/kg/min at 06/12/15 1117    ROS Review of Systems  Constitutional: Positive for fatigue. Negative for chills, diaphoresis, appetite  change and unexpected weight change.  HENT: Negative.  Negative for sinus pressure, sore throat and trouble swallowing.   Eyes: Negative.   Respiratory: Positive for apnea, cough, shortness of breath and wheezing. Negative for choking and chest tightness.   Cardiovascular: Negative.  Negative for chest pain, palpitations and leg swelling.  Gastrointestinal: Negative.  Negative for nausea, vomiting, abdominal pain, diarrhea, constipation and blood in stool.  Endocrine: Negative.  Negative for polydipsia, polyphagia and polyuria.  Genitourinary: Negative.  Negative for urgency, hematuria, flank pain and difficulty urinating.  Musculoskeletal: Positive for back pain. Negative for myalgias, joint swelling, arthralgias, gait problem and neck pain.  Skin: Negative.  Negative for color change, pallor and rash.  Allergic/Immunologic: Negative.   Neurological: Negative.  Negative for dizziness, tremors, weakness, light-headedness and numbness.  Hematological: Negative.  Negative for adenopathy. Does not bruise/bleed easily.  Psychiatric/Behavioral: Negative.     Objective:  BP 136/76 mmHg  Pulse 85  Temp(Src) 98.5 F (36.9 C) (Oral)  Resp 16  Ht '5\' 6"'$  (1.676 m)  Wt 204 lb (92.534 kg)  BMI 32.94 kg/m2  SpO2 94%  BP Readings from Last 3 Encounters:  01/24/16 136/76  01/21/16 139/63  01/16/16 146/78    Wt Readings from Last 3 Encounters:  01/24/16 204 lb (92.534 kg)  01/18/16 206 lb (93.441 kg)  01/16/16 208 lb (94.348 kg)    Physical Exam  Constitutional: He is oriented to person, place, and time. He appears well-developed. No distress.  HENT:  Mouth/Throat: Oropharynx is clear and moist. No oropharyngeal exudate.  Eyes: Conjunctivae are normal. Right eye exhibits no discharge. Left eye exhibits no discharge. No scleral icterus.  Neck: Normal range of motion. Neck supple. No JVD present. No tracheal deviation present. No thyromegaly present.  Cardiovascular: Normal rate, regular  rhythm, normal heart sounds and intact distal pulses.   Pulmonary/Chest: Effort normal and breath sounds normal. No accessory muscle usage or stridor. No respiratory distress. He has no decreased breath sounds. He has no wheezes. He has no rhonchi. He has no rales. He exhibits no tenderness.  Abdominal: Soft. Bowel sounds are normal. He exhibits no distension and no mass. There is no tenderness. There is no rebound and no guarding.  Musculoskeletal: Normal range of motion. He exhibits no edema or tenderness.  Lymphadenopathy:    He has no cervical adenopathy.  Neurological: He is oriented to person, place, and time.  Skin: Skin is warm and dry. No rash noted. He is not diaphoretic. No erythema. No pallor.  Psychiatric: He has a normal mood and affect. His behavior is normal. Judgment and thought content normal.  Vitals reviewed.   Lab Results  Component Value Date   WBC 13.8* 01/18/2016   HGB 14.2 01/18/2016   HCT 44.4 01/18/2016   PLT 130* 01/18/2016   GLUCOSE 152* 01/18/2016   CHOL 164 10/05/2015   TRIG 174.0* 10/05/2015   HDL 48.70 10/05/2015   LDLDIRECT 71.4 09/08/2012   LDLCALC 80 10/05/2015   ALT 24 01/18/2016   AST 26 01/18/2016   NA 139 01/18/2016   K 4.3 01/18/2016   CL 98* 01/18/2016   CREATININE 1.07 01/18/2016   BUN 9 01/18/2016   CO2 26 01/18/2016   TSH 2.353 08/07/2015   PSA 0.64 07/18/2011   INR 0.91 08/07/2015   HGBA1C 6.8* 01/08/2016   MICROALBUR 5.7* 10/05/2015    Nm Pet Image Initial (pi) Skull Base To Thigh  01/23/2016  CLINICAL DATA:  Subsequent treatment strategy for left squamous cell lung cancer. New right lung nodule. EXAM: NUCLEAR MEDICINE PET SKULL BASE TO THIGH TECHNIQUE: 10.7 mCi F-18 FDG was injected intravenously. Full-ring PET imaging was performed from the skull base to thigh after the radiotracer. CT data was obtained and used for attenuation correction and anatomic localization. FASTING BLOOD GLUCOSE:  Value: 194 mg/dl COMPARISON:  CT chest  dated 01/12/2016.  PET-CT dated 10/19/2013. FINDINGS: NECK No hypermetabolic lymph nodes in the neck. CHEST 1.6 x 1.9 cm right lower lobe pulmonary nodule (series 6/ image 41), max SUV 5.2, suspicious for metastasis. Radiation changes in the left lung apex/posterior left upper lobe. Associated central cavitation in the left lung apex (series 6/ image 13). Mild residual hypermetabolism, max SUV 4.8, without focal FDG avidity. Overall, this appearance remains compatible with post treatment changes. No pleural effusion or pneumothorax. Heart is normal in size. No pericardial effusion. Three vessel coronary atherosclerosis. Small mediastinal lymph nodes, including: --10 mm short axis right paratracheal node (series 4/image 63), max SUV 3.2 --9 mm short axis subcarinal node (series 4/image 72), max SUV 5.2 ABDOMEN/PELVIS No abnormal hypermetabolic activity within the liver, pancreas, adrenal glands, or spleen. 4.8 cm cystic lesion in the inferior spleen (series 4/image 119), unchanged since 2014, non FDG avid. Atherosclerotic calcifications the abdominal aorta and branch vessels. Colonic diverticulosis, without evidence of diverticulitis. No hypermetabolic lymph nodes in the abdomen or pelvis. SKELETON No focal hypermetabolic activity to suggest skeletal metastasis. Linear intramuscular activity along the right shoulder and medial left gluteal musculature (PET images 45 and 173), without underlying CT abnormality, favored to be physiologic. IMPRESSION: 1.9 cm hypermetabolic right lower lobe pulmonary nodule, suspicious for metastasis. Small mediastinal nodal metastases. Radiation changes in the left upper lobe/left lung apex. Electronically Signed   By: Julian Hy M.D.   On: 01/23/2016 16:30    Assessment & Plan:   Jeven was seen today for diabetes, hypertension and copd.  Diagnoses and all orders for this visit:  COPD with asthma ([place])- he is having frequent exacerbations so I have asked him to add  Daliresp to the theophylline as well as his inhaled medications. -     roflumilast (DALIRESP) 500 MCG TABS tablet; Take 1 tablet (500 mcg total) by mouth daily.  Diabetes mellitus without complication ([place])- his recent A1c was 6.8%, at this time he does not need any medications to treat this, I will recheck his A1c in about 4 months will treat if indicated.  Essential hypertension- his blood pressure is adequately well controlled, recent electrolytes and renal function were stable  Squamous cell lung cancer, right ([place])- he appears to have a recurrence with lymph  node involvement in the right lower lobe, he will be followed closely over the next few weeks by oncology and pulmonary.   I am having Mr. Liera start on roflumilast. I am also having him maintain his clonazePAM, ARIPiprazole, methocarbamol, esomeprazole, nitroGLYCERIN, mirtazapine, Fluticasone-Salmeterol, VENTOLIN HFA, FLUoxetine, gabapentin, metoprolol succinate, atorvastatin, clopidogrel, arformoterol, theophylline, potassium chloride SA, aspirin, morphine, morphine, BELSOMRA, isosorbide mononitrate, predniSONE, OXYGEN, albuterol, predniSONE, and azithromycin.  Meds ordered this encounter  Medications  . roflumilast (DALIRESP) 500 MCG TABS tablet    Sig: Take 1 tablet (500 mcg total) by mouth daily.    Dispense:  30 tablet    Refill:  11     Follow-up: Return in about 4 months (around 05/25/2016).  [name], MD

## 2016-01-24 NOTE — Patient Outreach (Signed)
[facility]) Care Management Transition of Care, week 1, call attempt #2 01/24/2016  Ernest [name] [place] Aug 07, 1956 [id number]   Successful telephone outreach to [name], to follow up after his recent IP hospital visit.  Patient verbalized that he understands all discharge instructions given to him at the hospital.  He reported that he had a PET scan earlier this week, and a post-discharge office visit with his PCP today.  He also reported that he has a scheduled follow up with his oncologist "in mid- March."  Pt. Stated he was not able to talk today for very long, and shared that he feels anxious after his hospitalization and recent discovery of a "new tumor" in his lung; we had previously scheduled a routine HV for COPD disease management (prior to his latest admission to the hospital) and he stated that he would like to re-schedule the home visit appointment "sooner" than February 13, 2016  Plan:  Previously scheduled [place] visit for February 13, 2015 was re-scheduled for Friday March 10, one day after his appointment with his oncologist to follow up on his PET scan.  [name], RN, BSN, [employer] [facility] Care Management  [phone]

## 2016-01-30 ENCOUNTER — Other Ambulatory Visit: Payer: Self-pay | Admitting: Nurse Practitioner

## 2016-01-31 ENCOUNTER — Ambulatory Visit (INDEPENDENT_AMBULATORY_CARE_PROVIDER_SITE_OTHER)
Admission: RE | Admit: 2016-01-31 | Discharge: 2016-01-31 | Disposition: A | Discharge status: Home / Self Care | Payer: Commercial Managed Care - HMO | Source: Ambulatory Visit | Attending: Acute Care | Admitting: Acute Care

## 2016-01-31 ENCOUNTER — Ambulatory Visit (INDEPENDENT_AMBULATORY_CARE_PROVIDER_SITE_OTHER): Payer: Commercial Managed Care - HMO | Admitting: Acute Care

## 2016-01-31 ENCOUNTER — Encounter: Payer: Self-pay | Admitting: Acute Care

## 2016-01-31 ENCOUNTER — Other Ambulatory Visit: Payer: Self-pay | Admitting: Internal Medicine

## 2016-01-31 VITALS — BP 142/72 | HR 88 | Ht 66.0 in | Wt 205.2 lb

## 2016-01-31 DIAGNOSIS — C3491 Malignant neoplasm of unspecified part of right bronchus or lung: Secondary | ICD-10-CM | POA: Diagnosis not present

## 2016-01-31 DIAGNOSIS — R0600 Dyspnea, unspecified: Secondary | ICD-10-CM

## 2016-01-31 DIAGNOSIS — J9611 Chronic respiratory failure with hypoxia: Secondary | ICD-10-CM

## 2016-01-31 DIAGNOSIS — R0602 Shortness of breath: Secondary | ICD-10-CM | POA: Diagnosis not present

## 2016-01-31 DIAGNOSIS — R05 Cough: Secondary | ICD-10-CM | POA: Diagnosis not present

## 2016-01-31 MED ORDER — PREDNISONE 5 MG PO TABS: ORAL_TABLET | ORAL | Status: DC

## 2016-01-31 NOTE — Progress Notes (Signed)
Subjective:    Patient ID: Ernest [name], male    DOB: 06-06-1956, 60 y.o.   MRN: [id number]  HPI 60 year old male patient with COPD/ bronchitis, OSA /CPAP, oxygen and steroid dependent, chronic respiratory failure.Pt was diagnosed with Squamous cell CA LUL in 2014 and was treated with XRT and Chemotherapy. 12/2015: suspected recurrence of squamous cell lung cancer per CT with enlargement of RIGHT lower lobe nodule which is concerning for a pulmonary metastasis. Follow up and plan of care per oncology. Significant Events: Recent Hospitalization: Admit date: 11/24/2015 Discharge date: 11/30/2015   Discharge Diagnoses:  Principal Problem:  Acute on chronic respiratory failure with hypoxia and hypercapnia (HCC) Active Problems:  Essential hypertension  COPD with asthma ([place])  Diabetes mellitus without complication (HCC)  COPD exacerbation ([place])  Acute respiratory failure with hypercapnia (HCC)  Sepsis due to pneumonia ([place]) Flu Swab : Negative Respiratory viral panel negative  Treated with 6 days of IV antibiotic therapy with ceftriaxone and azithromycin/ Prednisone.  01/12/16 CT Chest/ contrast:  IMPRESSION: 1. Significant enlargement of RIGHT lower lobe nodule is concerning for a pulmonary metastasis. Recommend tissue sampling with bronchoscopy or percutaneous biopsy. 2. Stable postsurgical and post therapy change in the LEFT upper lobe. 3. Stable small mediastinal lymph nodes.   01/31/16: CXR: IMPRESSION: 1. Apparent interval enlargement of the right lower lobe pulmonary nodule consistent with either enlarging pulmonary metastasis or primary lung carcinoma. 2. Stable pleural parenchymal scarring in the left lung apex.   01/31/16:Hospital Follow Up:  Patient presents to the office with continued dyspnea. He stated he thought He was going to have to call EMS last night. Chest x-ray today indicates Significant enlargement of right lower lobe pulmonary nodule in  comparison to CT scan done 01/12/2016, but no active infectious process. Patient has appointment with Dr. Earlie Server in oncology tomorrow 02/01/2016 for follow-up regarding treatment of suspected recurrent squamous cell carcinoma. He states he is not coughing up any discolored sputum. What he does cough up is white to clear. He continues to use his Mucinex as he feels like he has chest congestion that is not clearing with cough. He continues to take his maintenance prednisone dose of 5 mg daily. He is using his oxygen at home at 3 L nasal cannula, but has come to the office today without his oxygen. He denies chest pain, orthopnea, or  Hemoptysis.   Current outpatient prescriptions:  .  albuterol (PROVENTIL) (2.5 MG/3ML) 0.083% nebulizer solution, INHALE 1 VIAL IN NEBULIZER EVERY 4 HOURS AS NEEDED FOR WHEEZING OR SHORTNESS OF BREATH, Disp: 300 mL, Rfl: 2 .  arformoterol (BROVANA) 15 MCG/2ML NEBU, Take 2 mLs (15 mcg total) by nebulization 2 (two) times daily., Disp: 8 mL, Rfl: 0 .  ARIPiprazole (ABILIFY) 5 MG tablet, Take 1 tablet (5 mg total) by mouth at bedtime., Disp: , Rfl:  .  aspirin 81 MG EC tablet, TAKE 1 TABLET BY MOUTH EVERY DAY, Disp: 30 tablet, Rfl: 5 .  atorvastatin (LIPITOR) 20 MG tablet, TAKE 1 TABLET EVERY DAY, Disp: 90 tablet, Rfl: 3 .  azithromycin (ZITHROMAX) 250 MG tablet, Take 1 tablet (250 mg total) by mouth once., Disp: 1 tablet, Rfl: 0 .  BELSOMRA 20 MG TABS, Take 1 tablet by mouth at bedtime. Take 1 tab by mouth daily at bed time for sleep prn, Disp: , Rfl: 4 .  clonazePAM (KLONOPIN) 1 MG tablet, Take 2 mg by mouth at bedtime as needed for anxiety. Reported on 01/16/2016, Disp: , Rfl:  .  clopidogrel (PLAVIX) 75 MG tablet, Take 1 tablet (75 mg total) by mouth daily., Disp: 90 tablet, Rfl: 3 .  esomeprazole (NEXIUM) 40 MG capsule, Take 40 mg by mouth every morning. , Disp: , Rfl:  .  FLUoxetine (PROZAC) 40 MG capsule, Take 40 mg by mouth every morning., Disp: , Rfl:  .   Fluticasone-Salmeterol (ADVAIR DISKUS) 500-50 MCG/DOSE AEPB, 1 puff then rinse mouth, twice daily maintenance, Disp: 60 each, Rfl: 11 .  gabapentin (NEURONTIN) 100 MG capsule, Take 100 mg by mouth daily. , Disp: , Rfl:  .  isosorbide mononitrate (IMDUR) 60 MG 24 hr tablet, Take 60 mg by mouth daily., Disp: , Rfl:  .  methocarbamol (ROBAXIN) 500 MG tablet, Take 500 mg by mouth 2 (two) times daily as needed for muscle spasms. Reported on 01/16/2016, Disp: , Rfl:  .  metoprolol succinate (TOPROL-XL) 25 MG 24 hr tablet, TAKE 1/2 TABLET BY MOUTH DAILY, Disp: 30 tablet, Rfl: 3 .  mirtazapine (REMERON) 45 MG tablet, Take 1 tablet (45 mg total) by mouth at bedtime., Disp: , Rfl:  .  morphine (MS CONTIN) 30 MG 12 hr tablet, Take 1 tablet (30 mg total) by mouth every 12 (twelve) hours., Disp: 60 tablet, Rfl: 0 .  morphine (MSIR) 15 MG tablet, Take 1 tablet (15 mg total) by mouth every 4 (four) hours as needed for severe pain., Disp: 30 tablet, Rfl: 0 .  nitroGLYCERIN (NITROSTAT) 0.4 MG SL tablet, Place 1 tablet (0.4 mg total) under the tongue every 5 (five) minutes as needed for chest pain., Disp: 25 tablet, Rfl: 3 .  OXYGEN, Inhale 2 L into the lungs at bedtime as needed., Disp: , Rfl:  .  potassium chloride SA (K-DUR,KLOR-CON) 20 MEQ tablet, Take 1 tablet (20 mEq total) by mouth every morning. Reported on 12/07/2015, Disp: 90 tablet, Rfl: 1 .  predniSONE (DELTASONE) 5 MG tablet, Take 5 mg by mouth daily., Disp: , Rfl:  .  roflumilast (DALIRESP) 500 MCG TABS tablet, Take 1 tablet (500 mcg total) by mouth daily., Disp: 30 tablet, Rfl: 11 .  theophylline (UNIPHYL) 400 MG 24 hr tablet, Take 400 mg by mouth daily., Disp: , Rfl: 5 .  VENTOLIN HFA 108 (90 BASE) MCG/ACT inhaler, INHALE 2 PUFFS INTO THE LUNGS 4 TIMES DAILY AS NEEDED FOR WHEEZING, Disp: 18 Inhaler, Rfl: 11 .  predniSONE (DELTASONE) 5 MG tablet, TAKE '20MG'$  DAILY X4 DAYS, TAKE '15MG'$  DAILY FOR 10 DAYS., Disp: 26 tablet, Rfl: 0 No current  facility-administered medications for this visit.  Facility-Administered Medications Ordered in Other Visits:  .  DOBUTamine (DOBUTREX) 1,000 mcg/mL in dextrose 5% 250 mL infusion, 0-40 mcg/kg/min, Intravenous, Continuous, Fay Records, MD, Last Rate: 224.2 mL/hr at 06/12/15 1117, 40 mcg/kg/min at 06/12/15 1117   Past Medical History  Diagnosis Date  . CAD (coronary artery disease)     Left Main 30% stenosis, LAD 20 - 30 % stenosis, first and second diagonal branchesat 40 - 50%  stenosis with small arteries, circumflex had 30% stenosis in the large obtuse marginal, RCA at 70 - 80%  stenosis [not felt to be occlusive after evaluation with flow wire], distal 50 - 60% stenosis - [name][  . COPD (chronic obstructive pulmonary disease) (HCC)     Dr. Baird Lyons  . Depression   . Anxiety   . Hyperlipidemia   . Chronic insomnia   . Gout   . GERD (gastroesophageal reflux disease)   . PVD (peripheral vascular disease) ([place])  PTA/Stent right common iliac  . DDD (degenerative disc disease)   . Hx of colonoscopy   . COPD with asthma ([place]) 09/08/2007  . OSA (obstructive sleep apnea)     NPSG 09/10/10- AHI 11.3/hr  . Hypertension     dr Percival Spanish  . History of radiation therapy 11/10/13- 12/29/13    left lung 6600 cGy in 33 sessions  . Diabetes mellitus without complication ([place]) 9/93/5701  . Cancer ([place])     lung  . Lung cancer ([place]) 10/04/13    LUL squamous cell lung cancer  . History of cardiovascular stress test     Myoview 7/16:  Diaphragmatic attenuation, no ischemia, EF 56%; Low Risk    Allergies  Allergen Reactions  . Carboplatin Shortness Of Breath, Swelling and Rash    Swelling of lips, rash on face,eyes and head   Review of Systems Constitutional:   No  weight loss, night sweats,  Fevers, chills, fatigue, or  lassitude.  HEENT:   No headaches,  Difficulty swallowing,  Tooth/dental problems, or  Sore throat,                No sneezing, itching, ear ache, nasal  congestion, post nasal drip,   CV:  No chest pain,  Orthopnea, PND, swelling in lower extremities, anasarca, dizziness, palpitations, syncope.   GI  No heartburn, indigestion, abdominal pain, nausea, vomiting, diarrhea, change in bowel habits, loss of appetite, bloody stools.   Resp: + shortness of breath with exertion and at rest.  No excess mucus, no productive cough,  + non-productive cough,  No coughing up of blood.  No change in color of mucus.  No wheezing.  No chest wall deformity  Skin: no rash or lesions.  GU: no dysuria, change in color of urine, no urgency or frequency.  No flank pain, no hematuria   MS:  No joint pain or swelling.  No decreased range of motion.  No back pain.  Psych:  No change in mood or affect. No depression or anxiety.  No memory loss.        Objective:   Physical Exam  BP 142/72 mmHg  Pulse 88  Ht '5\' 6"'$  (1.676 m)  Wt 205 lb 3.2 oz (93.078 kg)  BMI 33.14 kg/m2  SpO2 93%  Physical Exam:  General- No distress,  A&Ox3 ENT: No sinus tenderness, TM clear, pale nasal mucosa, no oral exudate,no post nasal drip, no LAN Cardiac: S1, S2, regular rate and rhythm, no murmur Chest: + wheeze/ no rales/ dullness; no accessory muscle use, no nasal flaring, no sternal retractions Abd.: Soft Non-tender Ext: No clubbing cyanosis, edema Neuro:  normal strength Skin: No rashes, warm and dry Psych: normal mood and behavior  Magdalen Spatz, AGACNP-BC [place] Pager # [phone]    Assessment & Plan:

## 2016-01-31 NOTE — Assessment & Plan Note (Addendum)
Suspected recurrent NSCLC per CT 01/12/16 with last hospital admission. Per CXR 01/31/16 RLL nodule has increase in size from 19 x 14 mm to 25 mm.  Plan:  Appointment 02/01/16 with [name] for treatment plan re: suspected recurrence/ metastasis. Tissue sampling/staging per [name].

## 2016-01-31 NOTE — Patient Instructions (Addendum)
It is nice to meet you today. CXR today. Your CXR does not indicate any pneumonia. Continue your [employer] treatments twice daily Continue your Advair 1 puff twice daily  Rinse mouth with water after using Prednisone 20 mg daily for 4 days. Then maintenance dose of 15 mg daily until we have your breathing stabilized. Please notify your PCP of the increase in your prednisone dose so your therapy can be adjusted accordingly. Continue your theophylline 400 mg every day. Continue you Ventolin Inhaler 2 puffs up to 4 times daily as needed for wheezing and shortness of breath. Continue your Albuterol neb treatments every 4 hours as needed for wheezing and shortness of breath.  Wear your oxygen at 3 L [place] as needed goal saturations are >88% Continue using mucinex 600 mg twice daily. Try simple saline in you neb machine to assist with loosening your secretions. Follow up appointment in 2 weeks with Dr. Annamaria Boots Follow up with [name] 02/01/16 as already scheduled. Please contact office for sooner follow up if symptoms do not improve or worsen or seek emergency care

## 2016-01-31 NOTE — Assessment & Plan Note (Addendum)
Recent hospitalization for COPD exacerbation. Recent diagnosis of recurrent NSCLC Right Lower Lobe  Plan: Discussed with Dr. Baird Lyons. I have reviewed the CXR done today. CXR today ( indicates interval increase in RLL nodule from 19 x 14 mm to 25 mm) No pneumonia Continue your [employer] treatments twice daily Continue your Advair 1 puff twice daily  Rinse mouth with water after using Prednisone 20 mg daily for 4 days. Then maintenance dose of 15 mg daily until we have your breathing stabilized. Please notify your PCP of the increase in your prednisone dose so your therapy can be adjusted accordingly. Continue your theophylline 400 mg every day. Continue you Ventolin Inhaler 2 puffs up to 4 times daily as needed for wheezing and shortness of breath. Continue your Albuterol neb treatments every 4 hours as needed for wheezing and shortness of breath.  Wear your oxygen at 3 L [place] as needed goal saturations are >88% Continue using mucinex 600 mg twice daily. Try simple saline in you neb machine to assist with loosening your secretions. Follow up appointment in 2 weeks with Dr. Annamaria Boots Follow up with [name] 02/01/16 as already scheduled. Please contact office for sooner follow up if symptoms do not improve or worsen or seek emergency care

## 2016-02-01 ENCOUNTER — Ambulatory Visit: Payer: Medicare HMO | Admitting: Cardiology

## 2016-02-01 ENCOUNTER — Telehealth: Payer: Self-pay | Admitting: Internal Medicine

## 2016-02-01 ENCOUNTER — Ambulatory Visit (HOSPITAL_BASED_OUTPATIENT_CLINIC_OR_DEPARTMENT_OTHER): Payer: Commercial Managed Care - HMO | Admitting: Internal Medicine

## 2016-02-01 ENCOUNTER — Encounter: Payer: Self-pay | Admitting: Internal Medicine

## 2016-02-01 VITALS — BP 150/68 | HR 86 | Temp 98.2°F | Resp 18 | Ht 66.0 in | Wt 202.8 lb

## 2016-02-01 DIAGNOSIS — C3491 Malignant neoplasm of unspecified part of right bronchus or lung: Secondary | ICD-10-CM

## 2016-02-01 DIAGNOSIS — J9611 Chronic respiratory failure with hypoxia: Secondary | ICD-10-CM

## 2016-02-01 DIAGNOSIS — C7800 Secondary malignant neoplasm of unspecified lung: Secondary | ICD-10-CM

## 2016-02-01 DIAGNOSIS — J449 Chronic obstructive pulmonary disease, unspecified: Secondary | ICD-10-CM | POA: Diagnosis not present

## 2016-02-01 NOTE — Telephone Encounter (Signed)
Gave and prnted appt sched and avs for pt for March

## 2016-02-01 NOTE — Progress Notes (Signed)
[facility] [phone]   [phone]  OFFICE PROGRESS NOTE  [name], MD [address] 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  PRIOR THERAPY:  1) Concurrent chemoradiation with weekly carboplatin for AUC of 2 and paclitaxel 45 mg/M2, status post 7 cycles, last dose was given 12/20/2013. First dose on 11/01/2013. 2) Consolidation chemotherapy with carboplatin for AUC of 5 and paclitaxel 175 mg/M2 every 3 weeks with Neulasta support. First cycle on 02/08/2014. Status post 3 cycles and carboplatin was discontinued secondary to allergic reaction.    CURRENT THERAPY: Observation  CHEMOTHERAPY INTENT: Curative/control  CURRENT # OF CHEMOTHERAPY CYCLES: 0  CURRENT ANTIEMETICS: Zofran, dexamethasone and Compazine  CURRENT SMOKING STATUS: Former smoker  ORAL CHEMOTHERAPY AND CONSENT: None  CURRENT BISPHOSPHONATES USE: None  PAIN MANAGEMENT: 0/10  NARCOTICS INDUCED CONSTIPATION: None.  LIVING WILL AND CODE STATUS: Full code.   INTERVAL HISTORY: Ernest [name] 60 y.o. male returns to the clinic today for four-month followup visit accompanied by his wife. The patient is feeling fine today with no specific complaints except for shortness of breath with exertion secondary to COPD. He was seen at the pulmonary office yesterday. She has not started any new medication but there was an adjustment of his inhalers. He denied having any significant chest pain or hemoptysis. He denied having any nausea or vomiting, no fever or chills. He was found on previous CT scan of the chest to have a suspicious nodule in the right lower lobe and the patient underwent a PET scan recently for evaluation of these nodules and he is here for? Of his PET scan results  and recommendation regarding his condition.  MEDICAL HISTORY: Past Medical History  Diagnosis Date  . CAD (coronary artery disease)     Left Main 30% stenosis, LAD 20 - 30 % stenosis, first and second diagonal branchesat 40 - 50%  stenosis with small arteries, circumflex had 30% stenosis in the large obtuse marginal, RCA at 70 - 80%  stenosis [not felt to be occlusive after evaluation with flow wire], distal 50 - 60% stenosis - [name][  . COPD (chronic obstructive pulmonary disease) (HCC)     Dr. Baird Lyons  . Depression   . Anxiety   . Hyperlipidemia   . Chronic insomnia   . Gout   . GERD (gastroesophageal reflux disease)   . PVD (peripheral vascular disease) (HCC)     PTA/Stent right common iliac  . DDD (degenerative disc disease)   . Hx of colonoscopy   . COPD with asthma ([place] [facility]) 09/08/2007  . OSA (obstructive sleep apnea)     NPSG 09/10/10- AHI 11.3/hr  . Hypertension     dr Percival Spanish  . History of radiation therapy 11/10/13- 12/29/13    left lung 6600 cGy in 33 sessions  . Diabetes mellitus without complication ([place]) 8/84/1660  . Cancer ([place])     lung  . Lung cancer ([place]) 10/04/13    LUL squamous cell lung cancer  . History of cardiovascular stress test     Myoview 7/16:  Diaphragmatic attenuation, no ischemia, EF 56%; Low Risk    ALLERGIES:  is allergic to carboplatin.  MEDICATIONS:  Current Outpatient Prescriptions  Medication Sig Dispense Refill  . ADVAIR DISKUS 500-50 MCG/DOSE AEPB 1 PUFF THEN RINSE MOUTH, TWICE  DAILY MAINTENANCE 60 each 2  . albuterol (PROVENTIL) (2.5 MG/3ML) 0.083% nebulizer solution INHALE 1 VIAL IN NEBULIZER EVERY 4 HOURS AS NEEDED FOR WHEEZING OR SHORTNESS OF BREATH 300 mL 2  . arformoterol (BROVANA) 15 MCG/2ML NEBU Take 2 mLs (15 mcg total) by nebulization 2 (two) times daily. 8 mL 0  . ARIPiprazole (ABILIFY) 5 MG tablet Take 1 tablet (5 mg total) by mouth at bedtime.    [name] aspirin 81 MG EC tablet TAKE 1 TABLET BY MOUTH EVERY DAY 30  tablet 5  . atorvastatin (LIPITOR) 20 MG tablet TAKE 1 TABLET EVERY DAY 90 tablet 3  . azithromycin (ZITHROMAX) 250 MG tablet Take 1 tablet (250 mg total) by mouth once. 1 tablet 0  . BELSOMRA 20 MG TABS Take 1 tablet by mouth at bedtime. Take 1 tab by mouth daily at bed time for sleep prn  4  . clonazePAM (KLONOPIN) 1 MG tablet Take 2 mg by mouth at bedtime as needed for anxiety. Reported on 01/16/2016    . clopidogrel (PLAVIX) 75 MG tablet Take 1 tablet (75 mg total) by mouth daily. 90 tablet 3  . esomeprazole (NEXIUM) 40 MG capsule Take 40 mg by mouth every morning.     [name] FLUoxetine (PROZAC) 40 MG capsule Take 40 mg by mouth every morning.    . gabapentin (NEURONTIN) 100 MG capsule Take 100 mg by mouth daily.     . isosorbide mononitrate (IMDUR) 60 MG 24 hr tablet Take 60 mg by mouth daily.    . methocarbamol (ROBAXIN) 500 MG tablet Take 500 mg by mouth 2 (two) times daily as needed for muscle spasms. Reported on 01/16/2016    . metoprolol succinate (TOPROL-XL) 25 MG 24 hr tablet TAKE 1/2 TABLET BY MOUTH DAILY 30 tablet 3  . mirtazapine (REMERON) 45 MG tablet Take 1 tablet (45 mg total) by mouth at bedtime.    [name] morphine (MS CONTIN) 30 MG 12 hr tablet Take 1 tablet (30 mg total) by mouth every 12 (twelve) hours. 60 tablet 0  . morphine (MSIR) 15 MG tablet Take 1 tablet (15 mg total) by mouth every 4 (four) hours as needed for severe pain. 30 tablet 0  . nitroGLYCERIN (NITROSTAT) 0.4 MG SL tablet Place 1 tablet (0.4 mg total) under the tongue every 5 (five) minutes as needed for chest pain. 25 tablet 3  . OXYGEN Inhale 2 L into the lungs at bedtime as needed.    . potassium chloride SA (K-DUR,KLOR-CON) 20 MEQ tablet Take 1 tablet (20 mEq total) by mouth every morning. Reported on 12/07/2015 90 tablet 1  . predniSONE (DELTASONE) 5 MG tablet Take 5 mg by mouth daily.    . predniSONE (DELTASONE) 5 MG tablet TAKE '20MG'$  DAILY X4 DAYS, TAKE '15MG'$  DAILY FOR 10 DAYS. 26 tablet 0  . roflumilast (DALIRESP)  500 MCG TABS tablet Take 1 tablet (500 mcg total) by mouth daily. 30 tablet 11  . theophylline (UNIPHYL) 400 MG 24 hr tablet Take 400 mg by mouth daily.  5  . VENTOLIN HFA 108 (90 BASE) MCG/ACT inhaler INHALE 2 PUFFS INTO THE LUNGS 4 TIMES DAILY AS NEEDED FOR WHEEZING 18 Inhaler 11   No current facility-administered medications for this visit.   Facility-Administered Medications Ordered in Other Visits  Medication Dose Route Frequency Provider Last Rate Last Dose  . DOBUTamine (DOBUTREX) 1,000 mcg/mL in dextrose 5% 250 mL infusion  0-40 mcg/kg/min Intravenous Continuous Fay Records, MD 224.2 mL/hr at 06/12/15 1117 40  mcg/kg/min at 06/12/15 1117    SURGICAL HISTORY:  Past Surgical History  Procedure Laterality Date  . Spinal fusion  03/05/2007    L4-L5  . Hip surgery      left 'bone graft taken"  . Arm surgery      left elbow  . Shoulder surgery      right  . C-spine surgery    . Angioplasty    . Video bronchoscopy with endobronchial navigation N/A 10/04/2013    Procedure: VIDEO BRONCHOSCOPY WITH ENDOBRONCHIAL NAVIGATION;  Surgeon: Collene Gobble, MD;  Location: [facility];  Service: Thoracic;  Laterality: N/A;  . Back surgery      lower  . Anterior fusion cervical spine      cervical fusion  7 yrs ago ([facility])  . Colon surgery      '11 "Diverticulitis"  . Colonoscopy with propofol N/A 05/22/2015    Procedure: COLONOSCOPY WITH PROPOFOL;  Surgeon: Inda Castle, MD;  Location: [facility];  Service: Endoscopy;  Laterality: N/A;  . Peripheral vascular catheterization N/A 08/10/2015    Procedure: Lower Extremity Angiography;  Surgeon: Lorretta Harp, MD; Distal Ao OK, L-EIA stent OK, R-CIA 100% s/p overlapping 7 mm x 38 mm ICast stents, 50-60% R-CFA        REVIEW OF SYSTEMS:  A comprehensive review of systems was negative except for: Respiratory: positive for dyspnea on exertion   PHYSICAL EXAMINATION: General appearance: alert, cooperative and no distress Head: Normocephalic,  without obvious abnormality, atraumatic Neck: no adenopathy, no JVD, supple, symmetrical, trachea midline and thyroid not enlarged, symmetric, no tenderness/mass/nodules Lymph nodes: Cervical, supraclavicular, and axillary nodes normal. Resp: wheezes bilaterally Back: symmetric, no curvature. ROM normal. No CVA tenderness. Cardio: regular rate and rhythm, S1, S2 normal, no murmur, click, rub or gallop GI: soft, non-tender; bowel sounds normal; no masses,  no organomegaly Extremities: extremities normal, atraumatic, no cyanosis or edema Neurologic: Alert and oriented X 3, normal strength and tone. Normal symmetric reflexes. Normal coordination and gait  ECOG PERFORMANCE STATUS: 1 - Symptomatic but completely ambulatory  Blood pressure 150/68, pulse 86, temperature 98.2 F (36.8 C), temperature source Oral, resp. rate 18, height '5\' 6"'$  (1.676 m), weight 202 lb 12.8 oz (91.989 kg), SpO2 95 %.  LABORATORY DATA: Lab Results  Component Value Date   WBC 13.8* 01/18/2016   HGB 14.2 01/18/2016   HCT 44.4 01/18/2016   MCV 92.7 01/18/2016   PLT 130* 01/18/2016      Chemistry      Component Value Date/Time   NA 139 01/18/2016 0236   NA 140 01/11/2016 0954   K 4.3 01/18/2016 0236   K 3.7 01/11/2016 0954   CL 98* 01/18/2016 0236   CO2 26 01/18/2016 0236   CO2 31* 01/11/2016 0954   BUN 9 01/18/2016 0236   BUN 9.2 01/11/2016 0954   CREATININE 1.07 01/18/2016 0236   CREATININE 1.0 01/11/2016 0954   CREATININE 0.80 08/07/2015 0959      Component Value Date/Time   CALCIUM 9.1 01/18/2016 0236   CALCIUM 9.1 01/11/2016 0954   ALKPHOS 113 01/18/2016 0236   ALKPHOS 120 01/11/2016 0954   AST 26 01/18/2016 0236   AST 19 01/11/2016 0954   ALT 24 01/18/2016 0236   ALT 21 01/11/2016 0954   BILITOT 0.3 01/18/2016 0236   BILITOT 0.66 01/11/2016 0954       RADIOGRAPHIC STUDIES: Dg Chest 2 View  01/31/2016  CLINICAL DATA:  Shortness of breath, cough, congestion for 7 days,  history of left  upper lobe squamous cell carcinoma EXAM: CHEST  2 VIEW COMPARISON:  PET-CT of 01/23/2016, portable chest 02232 7,017, and CT chest of 01/12/2016 FINDINGS: The nodule overlying the right hilum located in the posterior medial right lower lobe appears to have enlarged since the prior CT of the chest. On the CT of the chest this lesion measured 19 x 14 mm, currently measuring 25 mm on the frontal chest x-ray, consistent with an enlarging metastatic deposit or primary lung carcinoma. No additional lung nodule is seen. The lungs are hyperaerated. Left apical pleural parenchymal scarring is stable. The heart is mildly enlarged and stable. A lower anterior cervical spine fusion plate is again noted. IMPRESSION: 1. Apparent interval enlargement of the right lower lobe pulmonary nodule consistent with either enlarging pulmonary metastasis or primary lung carcinoma. 2. Stable pleural parenchymal scarring in the left lung apex. Electronically Signed   By: Ivar Drape M.D.   On: 01/31/2016 15:20   Ct Chest W Contrast  01/12/2016  CLINICAL DATA:  Lung cancer diagnosed 2014 chemotherapy complete 2015. Subsequent treatment evaluation. EXAM: CT CHEST WITH CONTRAST TECHNIQUE: Multidetector CT imaging of the chest was performed during intravenous contrast administration. CONTRAST:  38m OMNIPAQUE IOHEXOL 300 MG/ML  SOLN COMPARISON:  CT 09/06/2015 FINDINGS: Mediastinum/Nodes: No axillary or supraclavicular lymphadenopathy. No mediastinal or hilar lymphadenopathy. Esophagus normal. There is a lymph node along the descending thoracic aorta measuring 7 mm (image 44, series 2) which is not changed from prior. Lungs/Pleura: Consolidative pattern in the LEFT upper lobe extending from the hilum is not changed from prior. No LEFT pulmonary nodules. Within the RIGHT lower lobe new 19 mm x 15 mm oblong nodule (image 36, series 5). This is at the same site of small 5 mm ill-defined nodule on comparison CT. Upper abdomen: All no focal hepatic  lesion. This low-density lesion in the inferior aspect of the spleen which is not changed. Adrenal glands normal. Atherosclerotic calcification aorta. Musculoskeletal: No aggressive osseous lesion. IMPRESSION: 1. Significant enlargement of RIGHT lower lobe nodule is concerning for a pulmonary metastasis. Recommend tissue sampling with bronchoscopy or percutaneous biopsy. 2. Stable postsurgical and post therapy change in the LEFT upper lobe. 3. Stable small mediastinal lymph nodes. Electronically Signed   By: SSuzy BouchardM.D.   On: 01/12/2016 10:02   Nm Pet Image Initial (pi) Skull Base To Thigh  01/23/2016  CLINICAL DATA:  Subsequent treatment strategy for left squamous cell lung cancer. New right lung nodule. EXAM: NUCLEAR MEDICINE PET SKULL BASE TO THIGH TECHNIQUE: 10.7 mCi F-18 FDG was injected intravenously. Full-ring PET imaging was performed from the skull base to thigh after the radiotracer. CT data was obtained and used for attenuation correction and anatomic localization. FASTING BLOOD GLUCOSE:  Value: 194 mg/dl COMPARISON:  CT chest dated 01/12/2016.  PET-CT dated 10/19/2013. FINDINGS: NECK No hypermetabolic lymph nodes in the neck. CHEST 1.6 x 1.9 cm right lower lobe pulmonary nodule (series 6/ image 41), max SUV 5.2, suspicious for metastasis. Radiation changes in the left lung apex/posterior left upper lobe. Associated central cavitation in the left lung apex (series 6/ image 13). Mild residual hypermetabolism, max SUV 4.8, without focal FDG avidity. Overall, this appearance remains compatible with post treatment changes. No pleural effusion or pneumothorax. Heart is normal in size. No pericardial effusion. Three vessel coronary atherosclerosis. Small mediastinal lymph nodes, including: --10 mm short axis right paratracheal node (series 4/image 63), max SUV 3.2 --9 mm short axis subcarinal node (series 4/image 72),  max SUV 5.2 ABDOMEN/PELVIS No abnormal hypermetabolic activity within the liver,  pancreas, adrenal glands, or spleen. 4.8 cm cystic lesion in the inferior spleen (series 4/image 119), unchanged since 2014, non FDG avid. Atherosclerotic calcifications the abdominal aorta and branch vessels. Colonic diverticulosis, without evidence of diverticulitis. No hypermetabolic lymph nodes in the abdomen or pelvis. SKELETON No focal hypermetabolic activity to suggest skeletal metastasis. Linear intramuscular activity along the right shoulder and medial left gluteal musculature (PET images 45 and 173), without underlying CT abnormality, favored to be physiologic. IMPRESSION: 1.9 cm hypermetabolic right lower lobe pulmonary nodule, suspicious for metastasis. Small mediastinal nodal metastases. Radiation changes in the left upper lobe/left lung apex. Electronically Signed   By: Julian Hy M.D.   On: 01/23/2016 16:30   Dg Chest Portable 1 View  01/18/2016  CLINICAL DATA:  Acute onset of cough, congestion, shortness of breath and wheezing. Initial encounter. EXAM: PORTABLE CHEST 1 VIEW COMPARISON:  Chest radiograph performed 11/25/2015, and CT of the chest performed 01/12/2016 FINDINGS: The lungs are well-aerated. The previously noted right lower lobe nodule is difficult to characterize on radiograph. Postsurgical and post therapy change is again noted at the left lung apex. There is no evidence of pleural effusion or pneumothorax. The cardiomediastinal silhouette is mildly enlarged. No acute osseous abnormalities are seen. IMPRESSION: 1. No definite acute focal airspace consolidation seen. 2. Previously noted right lower lobe nodule is not well characterized on radiograph. Postsurgical and post therapy change again noted at the left lung apex. 3. Mild cardiomegaly. Electronically Signed   By: Garald Balding M.D.   On: 01/18/2016 03:15    ASSESSMENT AND PLAN: This is a very pleasant 60 years old white male with stage IIIA non-small cell lung cancer, squamous cell carcinoma currently undergoing  concurrent chemoradiation with weekly carboplatin and paclitaxel status post 7 weeks of treatment. He tolerated his treatment fairly well with no significant adverse effects. This was followed by consolidation chemotherapy with carboplatin and paclitaxel status post 3 cycles, .carboplatin was discontinued after cycle #2 secondary to hypersensitivity reaction. The recent CT scan of the chest progressive enlargement of the right lower lobe lung nodule concerning for pulmonary metastasis or new synchronous tumor. The recent PET scan showed hypermetabolic activity in the 1.9 cm right lower lobe pulmonary nodule suspicious for metastasis. There was also small mediastinal node metastasis. I discussed the PET scan results and showed the images to the patient and his wife today. I recommended for him to have CT-guided core biopsy by interventional radiology for confirmation of diagnosis. I will see the patient back for follow-up visit in 2 weeks for evaluation and discussion of his treatment options based on the final pathology. For hypertension, I recommended for the patient to continue follow-up with his primary care physician for adjustment of his antihypertensive medication.  He was advised to call immediately if he has any concerning symptoms in the interval. The patient voices understanding of current disease status and treatment options and is in agreement with the current care plan.  All questions were answered. The patient knows to call the clinic with any problems, questions or concerns. We can certainly see the patient much sooner if necessary.  Disclaimer: This note was dictated with voice recognition software. Similar sounding words can inadvertently be transcribed and may not be corrected upon review.

## 2016-02-02 ENCOUNTER — Encounter: Payer: Self-pay | Admitting: *Deleted

## 2016-02-02 ENCOUNTER — Other Ambulatory Visit: Payer: Self-pay | Admitting: *Deleted

## 2016-02-02 ENCOUNTER — Telehealth: Payer: Self-pay | Admitting: Acute Care

## 2016-02-02 NOTE — Patient Outreach (Signed)
[facility]) Care Management  Transition of Care, week 2, Community RNCM home visit 02/02/2016  Ernest [name] 26-Feb-1956 [id number]  Ernest [name] is an 60 y.o. male followed by [facility] for transition of care after recent inpatient hospital visit, as well as ongoing self management for COPD.  He was discharged home on January 21, 2016.  Since then, Ernest [name] acknowledges that he feels he has steadily improved, but also reports that he is feeling anxious and depressed regarding "the new spot they found on my (R) lung."  He had a PET scan on January 23, 2016, and has seen and or communicated with his PCP, pulmonary specialist, oncologist, and  psychiatrist since his discharge. He is scheduled for a biopsy of his (R) lung on February 06, 2016.  Ernest [name] has been using 3L O2 at home, and stated that he checks his pulse oximetry readings frequently each day, but does not write the values down.  He states that typically, his readings fluctuate between 84-85% after he has walked to the mailbox to check the mail, also stating that he becomes slightly short of breath when doing so.  He states that it usually takes him about 5 minutes to recover, after he rests and applies his home O2; he states his SaO2 readings then rise to 96-98%.  Ernest [name] also stated that he was put on a new medicine "for my breathing," by his PCP:  Daliresp (Rofllumilast) 500 mcg po once QD in am.  Ernest [name] reports that he has been taking this medicine for about one week, but does not see any improvement, and verbalizes that he does not understand what the purpose of the medicine is, stating, "it seems to make my chest feel more tight than it did before I started it."  He states that he meant to ask his pulmonary specialist about this medication when he was there for his appointment yesterday, but forgot.  I told Ernest [name] that I would reach out to his pulmonary specialists to  ask for him (via in-basket msg), and also advised him to follow up with them as wel during his next appointment, scheduled for February 14, 2016.  Ernest [name] manages all of his medication independently and fills a pill box weekly.  He generally understands the purpose of each medication as well as the correct dosages, schedules, etc.   I advised Ernest [name] to write down his pulse oximetry readings to take to his pulmonary specialists, as well as any questions that he had for his providers, which agreed to do.  I encouraged him to continue following up with al of his providers, and to reach out to his psychiatrist, if he felt more depressed/ anxious than usual, which he also agreed to, stating, "I have been going through this so long now, it's just a little worse because of the new spot they found, and the upcoming biopsy; I think I'll be better once that is over."  I provided positive reinforcement and encouragement for managing his self health compliantly.  Subjective: "I am just depressed and anxious because I am so worried about this new spot they found on my (R) lung."   Objective:    BP 146/72 mmHg  Pulse 85  Resp 16  Ht 1.676 m ('5\' 6"'$ )  Wt 204 lb (92.534 kg)  BMI 32.94 kg/m2  SpO2 96%  Review of Systems  Constitutional: Negative.   Respiratory: Positive for sputum production and shortness of breath. Negative  for cough, hemoptysis and wheezing.        Patient states that he has decreasing amounts of sputum; describes as clear-white and thick.  No coughing or sputum production noted during time of visit  Cardiovascular: Positive for chest pain.       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   Genitourinary: Negative.   Musculoskeletal: Positive for back pain. Negative for falls.       "My regular low back pain; it's nothing new; I am very used to it."  Skin: Negative.   Neurological: Negative.    Psychiatric/Behavioral: Positive for depression. The patient is nervous/anxious.        "I am just depressed and anxious because I am so worried about this new spot they found on my (R) lung."    Physical Exam  Constitutional: He is oriented to person, place, and time. He appears well-developed and well-nourished.  Cardiovascular: Normal rate, regular rhythm and normal heart sounds.   Respiratory: Effort normal and breath sounds normal. No respiratory distress. He has no wheezes. He has no rales. He exhibits tenderness.  Patient describes intermittent (L) chest soreness/ discomfort that he says is related to "scar tissue from his cancer."  Pt. States that this pain is not new, and that his providers are aware.  GI: Soft. Bowel sounds are normal.  Neurological: He is alert and oriented to person, place, and time.  Skin: Skin is warm and dry.  Psychiatric: He has a normal mood and affect. His behavior is normal. Judgment and thought content normal.    Current Medications:   Current Outpatient Prescriptions  Medication Sig Dispense Refill  . ADVAIR DISKUS 500-50 MCG/DOSE AEPB 1 PUFF THEN RINSE MOUTH, TWICE DAILY MAINTENANCE 60 each 2  . albuterol (PROVENTIL) (2.5 MG/3ML) 0.083% nebulizer solution INHALE 1 VIAL IN NEBULIZER EVERY 4 HOURS AS NEEDED FOR WHEEZING OR SHORTNESS OF BREATH 300 mL 2  . arformoterol (BROVANA) 15 MCG/2ML NEBU Take 2 mLs (15 mcg total) by nebulization 2 (two) times daily. 8 mL 0  . ARIPiprazole (ABILIFY) 5 MG tablet Take 1 tablet (5 mg total) by mouth at bedtime.    [name] aspirin 81 MG EC tablet TAKE 1 TABLET BY MOUTH EVERY DAY 30 tablet 5  . atorvastatin (LIPITOR) 20 MG tablet TAKE 1 TABLET EVERY DAY 90 tablet 3  . BELSOMRA 20 MG TABS Take 1 tablet by mouth at bedtime. Take 1 tab by mouth daily at bed time for sleep prn  4  . clonazePAM (KLONOPIN) 1 MG tablet Take 2 mg by mouth at bedtime as needed for anxiety. Reported on 01/16/2016    . clopidogrel (PLAVIX) 75 MG tablet  Take 1 tablet (75 mg total) by mouth daily. 90 tablet 3  . esomeprazole (NEXIUM) 40 MG capsule Take 40 mg by mouth every morning.     [name] FLUoxetine (PROZAC) 40 MG capsule Take 40 mg by mouth every morning.    . gabapentin (NEURONTIN) 100 MG capsule Take 100 mg by mouth daily.     . isosorbide mononitrate (IMDUR) 60 MG 24 hr tablet Take 60 mg by mouth daily.    . methocarbamol (ROBAXIN) 500 MG tablet Take 500 mg by mouth 2 (two) times daily as needed for muscle spasms. Reported on 01/16/2016    . metoprolol succinate (TOPROL-XL) 25 MG 24 hr tablet TAKE 1/2 TABLET BY MOUTH DAILY 30 tablet 3  . mirtazapine (REMERON) 45 MG  tablet Take 1 tablet (45 mg total) by mouth at bedtime.    [name] morphine (MS CONTIN) 30 MG 12 hr tablet Take 1 tablet (30 mg total) by mouth every 12 (twelve) hours. 60 tablet 0  . morphine (MSIR) 15 MG tablet Take 1 tablet (15 mg total) by mouth every 4 (four) hours as needed for severe pain. 30 tablet 0  . nitroGLYCERIN (NITROSTAT) 0.4 MG SL tablet Place 1 tablet (0.4 mg total) under the tongue every 5 (five) minutes as needed for chest pain. 25 tablet 3  . OXYGEN Inhale 2 L into the lungs at bedtime as needed.    . potassium chloride SA (K-DUR,KLOR-CON) 20 MEQ tablet Take 1 tablet (20 mEq total) by mouth every morning. Reported on 12/07/2015 90 tablet 1  . predniSONE (DELTASONE) 5 MG tablet Take 5 mg by mouth daily.    . predniSONE (DELTASONE) 5 MG tablet TAKE '20MG'$  DAILY X4 DAYS, TAKE '15MG'$  DAILY FOR 10 DAYS. 26 tablet 0  . roflumilast (DALIRESP) 500 MCG TABS tablet Take 1 tablet (500 mcg total) by mouth daily. 30 tablet 11  . theophylline (UNIPHYL) 400 MG 24 hr tablet Take 400 mg by mouth daily.  5  . VENTOLIN HFA 108 (90 BASE) MCG/ACT inhaler INHALE 2 PUFFS INTO THE LUNGS 4 TIMES DAILY AS NEEDED FOR WHEEZING 18 Inhaler 11  . azithromycin (ZITHROMAX) 250 MG tablet Take 1 tablet (250 mg total) by mouth once. (Patient not taking: Reported on 02/02/2016) 1 tablet 0   No current  facility-administered medications for this visit.   Facility-Administered Medications Ordered in Other Visits  Medication Dose Route Frequency Provider Last Rate Last Dose  . DOBUTamine (DOBUTREX) 1,000 mcg/mL in dextrose 5% 250 mL infusion  0-40 mcg/kg/min Intravenous Continuous Fay Records, MD 224.2 mL/hr at 06/12/15 1117 40 mcg/kg/min at 06/12/15 1117    Functional Status:   In your present state of health, do you have any difficulty performing the following activities: 02/02/2016 01/19/2016  Hearing? N N  Vision? N N  Difficulty concentrating or making decisions? N N  Walking or climbing stairs? Y N  Dressing or bathing? N N  Doing errands, shopping? N N  Preparing Food and eating ? - -  Using the Toilet? - -  In the past six months, have you accidently leaked urine? - -  Do you have problems with loss of bowel control? - -  Managing your Medications? - -  Managing your Finances? - -  Housekeeping or managing your Housekeeping? - -    Fall/Depression Screening:    PHQ 2/9 Scores 02/02/2016 12/07/2015 10/23/2015 10/16/2015  PHQ - 2 Score '1 1 1 1    '$ Assessment:    Although Mr. Cassedy reports that he is feeling better after his recent hospitalization, he acknowledges that he feels increased anxiety and sadness regarding the new "spot they found on my (R) lung."  He plans to follow up with his psychiatrist for further management of the depression he has experienced on an ongoing basis "ever since all this started."  Mr. Vicario has a strong support system with family and friends that help with his health care needs, and he is personally committed to follow his health care treatment plan by keeping all provider appointments, and taking his medications as prescribed.  Plan:   Mr. Narine will continue taking his medications as prescribed by his provider team Mr. Nguyenthi will keep his upcoming provider appointments as scheduled Mr. Nulty will continue taking his pulse  oximetry measurements and will begin writing the values down to take with him to his next pulmonary specialist provider appointment Mr. Akram will begin writing down his questions for his medical providers to take with him to upcoming appointments to help him remember to ask those questions during the appointment. I will send an in-basket message to Mr. Thornberry's pulmonary provider team regarding his question about his "new lung medicine." Next [facility] CM home visit planned for Tuesday, March 05, 2016 at 11:00 am  [name], RN, BSN, [place] [facility]  [phone]

## 2016-02-02 NOTE — Telephone Encounter (Signed)
I called Ernest [name] in response to a message I received from Ernest Naas, RN, BSN [employer] [place] [employer].During her visit with him today, he told Ernest [name] that the medication he was started on by his PCP Ernest [name]) Ernest [name] / Roflumilast) "it seems to make my chest feel more tight than it did before I started it."He also questioned  the purpose of starting him on the medication.I spoke with Ernest [name], the patient's pulmonologist for many  years. We both agreed if the medication is not helping, and in fact makes Ernest [name] feel worse, he should stop the medication. I tried calling Ernest [name], but there was no answer. I have left a message on his voice mail. I explained that both Ernest [name] and I feel that it is appropriate to stop taking the medication if it is not helping with his respirations. To continue taking his theophylline. I explained in the message that I am sure Dr. Ronnald [name] started it as an effort to possibly improve his shortness of breath, that the indication for the drug is to decrease or help with the chronic episodes of bronchitis patients with COPD experiences.I have asked that he call the office Monday and let us know if he has decided to stop the medication, so we can update his medication list. I told him I will see him 02/14/16 as is already scheduled. I will await his return call on Monday.  Addendum: I have spoken with Ernest [name] about the above. He verbalized understanding that it is ok to stop the  Daliresp / Roflumilast if it is indeed making him feel worse. We discussed he is to continue taking all of his other respiratory medications as are documented in his after visit summery of the office visit on 01/31/16, and that we will see him 02/14/16 as scheduled. He verbalized understanding.

## 2016-02-05 ENCOUNTER — Other Ambulatory Visit: Payer: Self-pay | Admitting: General Surgery

## 2016-02-06 ENCOUNTER — Ambulatory Visit (HOSPITAL_COMMUNITY)
Admission: RE | Admit: 2016-02-06 | Discharge: 2016-02-06 | Disposition: A | Discharge status: Home / Self Care | Payer: Commercial Managed Care - HMO | Source: Ambulatory Visit | Attending: Diagnostic Radiology | Admitting: Diagnostic Radiology

## 2016-02-06 ENCOUNTER — Ambulatory Visit (HOSPITAL_COMMUNITY)
Admission: RE | Admit: 2016-02-06 | Discharge: 2016-02-06 | Disposition: A | Discharge status: Home / Self Care | Payer: Commercial Managed Care - HMO | Source: Ambulatory Visit | Attending: Internal Medicine | Admitting: Internal Medicine

## 2016-02-06 DIAGNOSIS — C3431 Malignant neoplasm of lower lobe, right bronchus or lung: Secondary | ICD-10-CM | POA: Insufficient documentation

## 2016-02-06 DIAGNOSIS — C3491 Malignant neoplasm of unspecified part of right bronchus or lung: Secondary | ICD-10-CM

## 2016-02-06 DIAGNOSIS — R911 Solitary pulmonary nodule: Secondary | ICD-10-CM | POA: Diagnosis not present

## 2016-02-06 DIAGNOSIS — J9611 Chronic respiratory failure with hypoxia: Secondary | ICD-10-CM

## 2016-02-06 DIAGNOSIS — Z9889 Other specified postprocedural states: Secondary | ICD-10-CM

## 2016-02-06 DIAGNOSIS — C3492 Malignant neoplasm of unspecified part of left bronchus or lung: Secondary | ICD-10-CM | POA: Diagnosis present

## 2016-02-06 LAB — PROTIME-INR
INR: 0.99 (ref 0.00–1.49)
Prothrombin Time: 13.3 seconds (ref 11.6–15.2)

## 2016-02-06 LAB — APTT: APTT: 29 s (ref 24–37)

## 2016-02-06 LAB — CBC
HEMATOCRIT: 42.5 % (ref 39.0–52.0)
HEMOGLOBIN: 14 g/dL (ref 13.0–17.0)
MCH: 29.7 pg (ref 26.0–34.0)
MCHC: 32.9 g/dL (ref 30.0–36.0)
MCV: 90 fL (ref 78.0–100.0)
Platelets: 110 10*3/uL — ABNORMAL LOW (ref 150–400)
RBC: 4.72 MIL/uL (ref 4.22–5.81)
RDW: 14.6 % (ref 11.5–15.5)
WBC: 9 10*3/uL (ref 4.0–10.5)

## 2016-02-06 LAB — GLUCOSE, CAPILLARY
GLUCOSE-CAPILLARY: 315 mg/dL — AB (ref 65–99)
Glucose-Capillary: 388 mg/dL — ABNORMAL HIGH (ref 65–99)
Glucose-Capillary: 187 mg/dL — ABNORMAL HIGH (ref 65–99)

## 2016-02-06 MED ORDER — HYDROCODONE-ACETAMINOPHEN 5-325 MG PO TABS: 1.0000 | ORAL_TABLET | ORAL | Status: DC | PRN

## 2016-02-06 MED ORDER — INSULIN ASPART 100 UNIT/ML ~~LOC~~ SOLN
0.0000 [IU] | SUBCUTANEOUS | Status: DC
  Administered 2016-02-06: 20 [IU] via SUBCUTANEOUS

## 2016-02-06 MED ORDER — SODIUM CHLORIDE 0.9 % IV SOLN: INTRAVENOUS | Status: DC

## 2016-02-06 MED ORDER — MIDAZOLAM HCL 2 MG/2ML IJ SOLN
INTRAMUSCULAR | Status: AC | PRN
  Administered 2016-02-06: 0.5 mg via INTRAVENOUS
  Administered 2016-02-06: 1 mg via INTRAVENOUS

## 2016-02-06 MED ORDER — MIDAZOLAM HCL 2 MG/2ML IJ SOLN
INTRAMUSCULAR | Status: AC
  Filled 2016-02-06: qty 2

## 2016-02-06 MED ORDER — FENTANYL CITRATE (PF) 100 MCG/2ML IJ SOLN
INTRAMUSCULAR | Status: AC | PRN
  Administered 2016-02-06 (×3): 25 ug via INTRAVENOUS

## 2016-02-06 MED ORDER — FENTANYL CITRATE (PF) 100 MCG/2ML IJ SOLN
INTRAMUSCULAR | Status: AC
  Filled 2016-02-06: qty 2

## 2016-02-06 MED ORDER — LIDOCAINE HCL 1 % IJ SOLN
INTRAMUSCULAR | Status: AC
  Filled 2016-02-06: qty 20

## 2016-02-06 NOTE — Procedures (Signed)
CT guided core biopsies of nodule in right lower lobe. 2 cores obtained.  No immediate complication.  Minimal blood loss.  Plan for follow up CXR.

## 2016-02-06 NOTE — Sedation Documentation (Signed)
Patient is resting comfortably. No complaints of pain at this time. Tolerating prone position well

## 2016-02-06 NOTE — Sedation Documentation (Signed)
Patient is resting comfortably. Vitals stable.

## 2016-02-06 NOTE — Discharge Instructions (Signed)

## 2016-02-06 NOTE — Sedation Documentation (Signed)
Patient is resting comfortably. No complaints of pain at this time. Pt tolerating prone position well.

## 2016-02-06 NOTE — Sedation Documentation (Signed)
Pt  tolerated procedure well.

## 2016-02-06 NOTE — H&P (Signed)
Chief Complaint: Patient was seen in consultation today for No chief complaint on file.  at the request of [facility]  Referring Physician(s): Mohamed,Mohamed  Supervising Physician: Markus Daft  History of Present Illness: Ernest [name] is a 60 y.o. male with COPD, Bronchitis, and stage IIIA non-small cell lung cancer, squamous cell carcinoma currently undergoing concurrent chemoradiation with weekly carboplatin and paclitaxel status post 7 weeks of treatment.  He is also O2 dependent and steroid dependent.  Recent CT scan of the chest progressive enlargement of the right lower lobe lung nodule concerning for pulmonary metastasis or new synchronous tumor.  Recent PET scan showed hypermetabolic activity in the 1.9 cm right lower lobe pulmonary nodule suspicious for metastasis. There was also small mediastinal node metastasis.  [name] discussed the PET scan results with the patient and his wife. He recommended for him to have CT-guided core biopsy.  He is NPO. He takes Plavix and assured me he had held this medication. Last dose was 02/01/16.  He denies any other recent illness, fever, chills.  Past Medical History  Diagnosis Date  . CAD (coronary artery disease)     Left Main 30% stenosis, LAD 20 - 30 % stenosis, first and second diagonal branchesat 40 - 50%  stenosis with small arteries, circumflex had 30% stenosis in the large obtuse marginal, RCA at 70 - 80%  stenosis [not felt to be occlusive after evaluation with flow wire], distal 50 - 60% stenosis - [name][  . COPD (chronic obstructive pulmonary disease) (HCC)     Dr. Baird Lyons  . Depression   . Anxiety   . Hyperlipidemia   . Chronic insomnia   . Gout   . GERD (gastroesophageal reflux disease)   . PVD (peripheral vascular disease) (HCC)     PTA/Stent right common iliac  . DDD (degenerative disc disease)   . Hx of colonoscopy   . COPD with asthma ([place]) 09/08/2007  . OSA (obstructive sleep  apnea)     NPSG 09/10/10- AHI 11.3/hr  . Hypertension     dr Percival Spanish  . History of radiation therapy 11/10/13- 12/29/13    left lung 6600 cGy in 33 sessions  . Diabetes mellitus without complication ([place]) 8/93/8101  . Cancer ([place])     lung  . Lung cancer ([place]) 10/04/13    LUL squamous cell lung cancer  . History of cardiovascular stress test     Myoview 7/16:  Diaphragmatic attenuation, no ischemia, EF 56%; Low Risk    Past Surgical History  Procedure Laterality Date  . Spinal fusion  03/05/2007    L4-L5  . Hip surgery      left 'bone graft taken"  . Arm surgery      left elbow  . Shoulder surgery      right  . C-spine surgery    . Angioplasty    . Video bronchoscopy with endobronchial navigation N/A 10/04/2013    Procedure: VIDEO BRONCHOSCOPY WITH ENDOBRONCHIAL NAVIGATION;  Surgeon: Collene Gobble, MD;  Location: [place];  Service: Thoracic;  Laterality: N/A;  . Back surgery      lower  . Anterior fusion cervical spine      cervical fusion  7 yrs ago ([facility])  . Colon surgery      '11 "Diverticulitis"  . Colonoscopy with propofol N/A 05/22/2015    Procedure: COLONOSCOPY WITH PROPOFOL;  Surgeon: Inda Castle, MD;  Location: [facility];  Service: Endoscopy;  Laterality: N/A;  . Peripheral  vascular catheterization N/A 08/10/2015    Procedure: Lower Extremity Angiography;  Surgeon: Lorretta Harp, MD; Distal Ao OK, L-EIA stent OK, R-CIA 100% s/p overlapping 7 mm x 38 mm ICast stents, 50-60% R-CFA        Allergies: Carboplatin  Medications: Prior to Admission medications   Medication Sig Start Date End Date Taking? Authorizing Provider  ADVAIR DISKUS 500-50 MCG/DOSE AEPB 1 PUFF THEN RINSE MOUTH, TWICE DAILY MAINTENANCE 02/01/16  Yes Deneise Lever, MD  albuterol (PROVENTIL) (2.5 MG/3ML) 0.083% nebulizer solution INHALE 1 VIAL IN NEBULIZER EVERY 4 HOURS AS NEEDED FOR WHEEZING OR SHORTNESS OF BREATH 01/17/16  Yes Deneise Lever, MD  arformoterol (BROVANA) 15 MCG/2ML NEBU Take  2 mLs (15 mcg total) by nebulization 2 (two) times daily. 09/22/15  Yes Deneise Lever, MD  ARIPiprazole (ABILIFY) 5 MG tablet Take 1 tablet (5 mg total) by mouth at bedtime. 03/30/13  Yes Doe-Hyun Kyra Searles, DO  aspirin 81 MG EC tablet TAKE 1 TABLET BY MOUTH EVERY DAY 01/04/16  Yes Janith Lima, MD  atorvastatin (LIPITOR) 20 MG tablet TAKE 1 TABLET EVERY DAY 08/01/15  Yes Dorena Cookey, MD  BELSOMRA 20 MG TABS Take 1 tablet by mouth at bedtime. Take 1 tab by mouth daily at bed time for sleep prn 12/26/15  Yes Historical Provider, MD  clonazePAM (KLONOPIN) 1 MG tablet Take 2 mg by mouth at bedtime as needed for anxiety. Reported on 01/16/2016   Yes Historical Provider, MD  clopidogrel (PLAVIX) 75 MG tablet Take 1 tablet (75 mg total) by mouth daily. 08/11/15  Yes Almyra Deforest, PA  dextromethorphan-guaiFENesin (MUCINEX DM) 30-600 MG 12hr tablet Take 1 tablet by mouth 2 (two) times daily as needed for cough (congestion).   Yes Historical Provider, MD  esomeprazole (NEXIUM) 40 MG capsule Take 40 mg by mouth every morning.  06/20/14  Yes Doe-Hyun R Shawna Orleans, DO  FLUoxetine (PROZAC) 40 MG capsule Take 40 mg by mouth every morning.   Yes Historical Provider, MD  gabapentin (NEURONTIN) 100 MG capsule Take 100 mg by mouth daily.  07/15/15  Yes Historical Provider, MD  isosorbide mononitrate (IMDUR) 60 MG 24 hr tablet Take 60 mg by mouth daily. 12/06/15  Yes Historical Provider, MD  methocarbamol (ROBAXIN) 500 MG tablet Take 500 mg by mouth 2 (two) times daily as needed for muscle spasms. Reported on 01/16/2016 10/27/13  Yes Historical Provider, MD  metoprolol succinate (TOPROL-XL) 25 MG 24 hr tablet TAKE 1/2 TABLET BY MOUTH DAILY 07/27/15  Yes Minus Breeding, MD  mirtazapine (REMERON) 45 MG tablet Take 1 tablet (45 mg total) by mouth at bedtime. 12/29/14  Yes Tammy S Parrett, NP  morphine (MS CONTIN) 30 MG 12 hr tablet Take 1 tablet (30 mg total) by mouth every 12 (twelve) hours. 01/08/16  Yes Janith Lima, MD  morphine (MSIR) 15 MG  tablet Take 1 tablet (15 mg total) by mouth every 4 (four) hours as needed for severe pain. 01/08/16  Yes Janith Lima, MD  nitroGLYCERIN (NITROSTAT) 0.4 MG SL tablet Place 1 tablet (0.4 mg total) under the tongue every 5 (five) minutes as needed for chest pain. 08/09/14  Yes Minus Breeding, MD  OXYGEN Inhale 2 L into the lungs at bedtime as needed.   Yes Historical Provider, MD  potassium chloride SA (K-DUR,KLOR-CON) 20 MEQ tablet Take 1 tablet (20 mEq total) by mouth every morning. Reported on 12/07/2015 12/12/15  Yes Janith Lima, MD  predniSONE (DELTASONE) 5 MG  tablet Take 5 mg by mouth daily. 01/12/16  Yes Historical Provider, MD  roflumilast (DALIRESP) 500 MCG TABS tablet Take 1 tablet (500 mcg total) by mouth daily. 01/24/16  Yes Janith Lima, MD  theophylline (UNIPHYL) 400 MG 24 hr tablet Take 400 mg by mouth daily. 11/13/15  Yes Historical Provider, MD  VENTOLIN HFA 108 (90 BASE) MCG/ACT inhaler INHALE 2 PUFFS INTO THE LUNGS 4 TIMES DAILY AS NEEDED FOR WHEEZING 04/07/15  Yes Deneise Lever, MD     Family History  Problem Relation Age of Onset  . Heart attack Mother   . Heart attack Sister   . Lung cancer Sister   . Cancer Sister     small cell lung, mets to brain  . Emphysema Sister   . Hypertension Brother   . Stroke Neg Hx     Social History   Social History  . Marital Status: Married    Spouse Name: N/A  . Number of Children: N/A  . Years of Education: N/A   Occupational History  . Disabled welder    Social History Main Topics  . Smoking status: Former Smoker -- 0.50 packs/day for 43 years    Types: Cigarettes    Quit date: 09/23/2013  . Smokeless tobacco: Never Used     Comment: history of 3 PPD, 11/02/13   . Alcohol Use: No  . Drug Use: No  . Sexual Activity: No   Other Topics Concern  . Not on file   Social History Narrative    Review of Systems: A 12 point ROS discussed  Review of Systems  Constitutional: Negative for fever, chills, activity change,  appetite change and fatigue.  Respiratory: Positive for cough, shortness of breath and wheezing.   Cardiovascular: Negative for chest pain.  Gastrointestinal: Negative for nausea, vomiting, abdominal pain and abdominal distention.  Genitourinary: Negative.   Musculoskeletal: Negative.   Skin: Negative.   Neurological: Negative.   Psychiatric/Behavioral: Negative.     Vital Signs: BP 149/68 mmHg  Pulse 42  Temp(Src) 98.1 F (36.7 C) (Oral)  Resp 22  Ht '5\' 6"'$  (1.676 m)  Wt 204 lb (92.534 kg)  BMI 32.94 kg/m2  SpO2 96%  Physical Exam  Constitutional: He is oriented to person, place, and time.  Obese  HENT:  Head: Normocephalic and atraumatic.  Eyes: EOM are normal.  Neck: Neck supple.  Cardiovascular: Normal rate, regular rhythm and normal heart sounds.   Pulmonary/Chest: Effort normal.  Distant breath sounds bilaterally. Occasional wheeze  Abdominal: Soft. Bowel sounds are normal.  Musculoskeletal: Normal range of motion.  Neurological: He is alert and oriented to person, place, and time.  Skin: Skin is warm and dry.  Psychiatric: He has a normal mood and affect. His behavior is normal. Judgment and thought content normal.  Vitals reviewed.   Mallampati Score:  MD Evaluation Airway: WNL Heart: WNL Abdomen: WNL Chest/ Lungs: WNL ASA  Classification: 2 Mallampati/Airway Score: Two  Imaging: Dg Chest 2 View  01/31/2016  CLINICAL DATA:  Shortness of breath, cough, congestion for 7 days, history of left upper lobe squamous cell carcinoma EXAM: CHEST  2 VIEW COMPARISON:  PET-CT of 01/23/2016, portable chest 02232 7,[id number], and CT chest of 01/12/2016 FINDINGS: The nodule overlying the right hilum located in the posterior medial right lower lobe appears to have enlarged since the prior CT of the chest. On the CT of the chest this lesion measured 19 x 14 mm, currently measuring 25 mm on the frontal chest  x-ray, consistent with an enlarging metastatic deposit or primary lung  carcinoma. No additional lung nodule is seen. The lungs are hyperaerated. Left apical pleural parenchymal scarring is stable. The heart is mildly enlarged and stable. A lower anterior cervical spine fusion plate is again noted. IMPRESSION: 1. Apparent interval enlargement of the right lower lobe pulmonary nodule consistent with either enlarging pulmonary metastasis or primary lung carcinoma. 2. Stable pleural parenchymal scarring in the left lung apex. Electronically Signed   By: Ivar Drape M.D.   On: 01/31/2016 15:20   Ct Chest W Contrast  01/12/2016  CLINICAL DATA:  Lung cancer diagnosed 2014 chemotherapy complete 2015. Subsequent treatment evaluation. EXAM: CT CHEST WITH CONTRAST TECHNIQUE: Multidetector CT imaging of the chest was performed during intravenous contrast administration. CONTRAST:  83m OMNIPAQUE IOHEXOL 300 MG/ML  SOLN COMPARISON:  CT 09/06/2015 FINDINGS: Mediastinum/Nodes: No axillary or supraclavicular lymphadenopathy. No mediastinal or hilar lymphadenopathy. Esophagus normal. There is a lymph node along the descending thoracic aorta measuring 7 mm (image 44, series 2) which is not changed from prior. Lungs/Pleura: Consolidative pattern in the LEFT upper lobe extending from the hilum is not changed from prior. No LEFT pulmonary nodules. Within the RIGHT lower lobe new 19 mm x 15 mm oblong nodule (image 36, series 5). This is at the same site of small 5 mm ill-defined nodule on comparison CT. Upper abdomen: All no focal hepatic lesion. This low-density lesion in the inferior aspect of the spleen which is not changed. Adrenal glands normal. Atherosclerotic calcification aorta. Musculoskeletal: No aggressive osseous lesion. IMPRESSION: 1. Significant enlargement of RIGHT lower lobe nodule is concerning for a pulmonary metastasis. Recommend tissue sampling with bronchoscopy or percutaneous biopsy. 2. Stable postsurgical and post therapy change in the LEFT upper lobe. 3. Stable small mediastinal  lymph nodes. Electronically Signed   By: SSuzy BouchardM.D.   On: 01/12/2016 10:02   Nm Pet Image Initial (pi) Skull Base To Thigh  01/23/2016  CLINICAL DATA:  Subsequent treatment strategy for left squamous cell lung cancer. New right lung nodule. EXAM: NUCLEAR MEDICINE PET SKULL BASE TO THIGH TECHNIQUE: 10.7 mCi F-18 FDG was injected intravenously. Full-ring PET imaging was performed from the skull base to thigh after the radiotracer. CT data was obtained and used for attenuation correction and anatomic localization. FASTING BLOOD GLUCOSE:  Value: 194 mg/dl COMPARISON:  CT chest dated 01/12/2016.  PET-CT dated 10/19/2013. FINDINGS: NECK No hypermetabolic lymph nodes in the neck. CHEST 1.6 x 1.9 cm right lower lobe pulmonary nodule (series 6/ image 41), max SUV 5.2, suspicious for metastasis. Radiation changes in the left lung apex/posterior left upper lobe. Associated central cavitation in the left lung apex (series 6/ image 13). Mild residual hypermetabolism, max SUV 4.8, without focal FDG avidity. Overall, this appearance remains compatible with post treatment changes. No pleural effusion or pneumothorax. Heart is normal in size. No pericardial effusion. Three vessel coronary atherosclerosis. Small mediastinal lymph nodes, including: --10 mm short axis right paratracheal node (series 4/image 63), max SUV 3.2 --9 mm short axis subcarinal node (series 4/image 72), max SUV 5.2 ABDOMEN/PELVIS No abnormal hypermetabolic activity within the liver, pancreas, adrenal glands, or spleen. 4.8 cm cystic lesion in the inferior spleen (series 4/image 119), unchanged since 2014, non FDG avid. Atherosclerotic calcifications the abdominal aorta and branch vessels. Colonic diverticulosis, without evidence of diverticulitis. No hypermetabolic lymph nodes in the abdomen or pelvis. SKELETON No focal hypermetabolic activity to suggest skeletal metastasis. Linear intramuscular activity along the right shoulder and medial  left  gluteal musculature (PET images 45 and 173), without underlying CT abnormality, favored to be physiologic. IMPRESSION: 1.9 cm hypermetabolic right lower lobe pulmonary nodule, suspicious for metastasis. Small mediastinal nodal metastases. Radiation changes in the left upper lobe/left lung apex. Electronically Signed   By: Julian Hy M.D.   On: 01/23/2016 16:30   Dg Chest Portable 1 View  01/18/2016  CLINICAL DATA:  Acute onset of cough, congestion, shortness of breath and wheezing. Initial encounter. EXAM: PORTABLE CHEST 1 VIEW COMPARISON:  Chest radiograph performed 11/25/2015, and CT of the chest performed 01/12/2016 FINDINGS: The lungs are well-aerated. The previously noted right lower lobe nodule is difficult to characterize on radiograph. Postsurgical and post therapy change is again noted at the left lung apex. There is no evidence of pleural effusion or pneumothorax. The cardiomediastinal silhouette is mildly enlarged. No acute osseous abnormalities are seen. IMPRESSION: 1. No definite acute focal airspace consolidation seen. 2. Previously noted right lower lobe nodule is not well characterized on radiograph. Postsurgical and post therapy change again noted at the left lung apex. 3. Mild cardiomegaly. Electronically Signed   By: Garald Balding M.D.   On: 01/18/2016 03:15    Labs:  CBC:  Recent Labs  11/28/15 0557 01/11/16 0954 01/18/16 0236 02/06/16 0630  WBC 9.1 11.8* 13.8* 9.0  HGB 12.6* 13.8 14.2 14.0  HCT 40.0 42.7 44.4 42.5  PLT 98* 120* 130* PENDING    COAGS:  Recent Labs  08/07/15 0959 02/06/16 0630  INR 0.91 0.99  APTT 32 29    BMP:  Recent Labs  11/26/15 0925 11/27/15 0251 11/28/15 0557 01/11/16 0954 01/18/16 0236  NA 139 142 141 140 139  K 4.4 5.7* 4.5 3.7 4.3  CL 101 104 97*  --  98*  CO2 29 27 36* 31* 26  GLUCOSE 209* 130* 177* 154* 152*  BUN '13 12 12 '$ 9.2 9  CALCIUM 8.9 8.9 9.2 9.1 9.1  CREATININE 0.88 0.78 0.96 1.0 1.07  GFRNONAA >60 >60 >60   --  >60  GFRAA >60 >60 >60  --  >60    LIVER FUNCTION TESTS:  Recent Labs  11/24/15 0154 11/25/15 0710 01/11/16 0954 01/18/16 0236  BILITOT 0.2* 0.4 0.66 0.3  AST 55* 33 19 26  ALT 36 37 21 24  ALKPHOS 158* 113 120 113  PROT 6.2* 5.6* 6.5 6.1*  ALBUMIN 3.8 3.0* 3.5 3.6    TUMOR MARKERS: No results for input(s): AFPTM, CEA, CA199, CHROMGRNA in the last 8760 hours.  Assessment and Plan:  Enlarging right lower lobe lung nodule.  Patient and wife understand he is high risk due to the condition of his lungs and his overall health.  Blood glucose this morning was >350. This was treated with 20 units sub-cu insulin.  Will proceed with CT guided lung biopsy today by Dr. Anselm Pancoast.  Risks and Benefits discussed with the patient including, but not limited to bleeding, hemoptysis, respiratory failure requiring intubation, infection, pneumothorax requiring chest tube placement, stroke from air embolism or even death.  All of the patient's questions were answered, patient is agreeable to proceed. Consent signed and in chart.  Thank you for this interesting consult.  I greatly enjoyed meeting Brandt E Vallecillo and look forward to participating in their care.  A copy of this report was sent to the requesting provider on this date.  Electronically Signed: Murrell Redden PA-C 02/06/2016, 7:47 AM   I spent a total of  30 Minutes  in face  to face in clinical consultation, greater than 50% of which was counseling/coordinating care for CT guided lung biopsy.

## 2016-02-06 NOTE — Sedation Documentation (Signed)
Patient is resting comfortably.Vitals stable

## 2016-02-06 NOTE — Sedation Documentation (Signed)
Patient grimacing.

## 2016-02-07 ENCOUNTER — Other Ambulatory Visit: Payer: Self-pay | Admitting: *Deleted

## 2016-02-07 NOTE — Patient Outreach (Signed)
[facility]) Care Management Transition of Care telephone outreach, week 3 02/07/2016  Ernest [name] July 09, 1956 [id number]  Ernest [name] is an 60 y.o. male followed by [facility] for transition of care after recent inpatient hospital visit, as well as ongoing self management for COPD. He was discharged home on January 21, 2016.  Patient states that he is doing "pretty well today" after the biopsy of the new tumor on his (R) lung, "just a little sore."  He said that he hopes to hear back from Dr.Mohamed sometime next week with the biopsy results.  He has a scheduled office visit planned with [name] on February 20, 2016.  [name] reported that he has had no new problems, and confirmed that after our last visit together on January 24, 2016, he had stopped the Daliresp (roflumilast) after speaking with Eric Form, NP [facility] pulmonology.  [name] believes that his lungs "feel less tight" after stopping this medication.  He is scheduled to follow up with his pulmonology providers next week, on February 14, 2016.  I confirmed our next home visit appointment for March 05, 2016, and let [name] know that I would check in with him by phone next week, if I did not hear back from him before then.  I confirmed that [name] has my contact information and he agreed to call me if he has any new problems or issues develop.  Plan: [name] will keep all upcoming provider appointments [facility] Transition of Care phone call for post-discharge week 4, planned for next week [place] visit planned for March 05, 2016 at 11:00 am  [name], RN, BSN, [employer] Coordinator [facility] Care Management  [phone]

## 2016-02-13 ENCOUNTER — Ambulatory Visit: Payer: Commercial Managed Care - HMO | Admitting: *Deleted

## 2016-02-14 ENCOUNTER — Encounter: Payer: Self-pay | Admitting: Internal Medicine

## 2016-02-14 ENCOUNTER — Ambulatory Visit (INDEPENDENT_AMBULATORY_CARE_PROVIDER_SITE_OTHER): Payer: Commercial Managed Care - HMO | Admitting: Internal Medicine

## 2016-02-14 VITALS — BP 148/76 | HR 72 | Temp 98.4°F | Ht 66.0 in | Wt 205.6 lb

## 2016-02-14 DIAGNOSIS — J9611 Chronic respiratory failure with hypoxia: Secondary | ICD-10-CM

## 2016-02-14 DIAGNOSIS — J449 Chronic obstructive pulmonary disease, unspecified: Secondary | ICD-10-CM | POA: Diagnosis not present

## 2016-02-14 DIAGNOSIS — J45909 Unspecified asthma, uncomplicated: Secondary | ICD-10-CM

## 2016-02-14 DIAGNOSIS — G4733 Obstructive sleep apnea (adult) (pediatric): Secondary | ICD-10-CM

## 2016-02-14 DIAGNOSIS — F418 Other specified anxiety disorders: Secondary | ICD-10-CM | POA: Diagnosis not present

## 2016-02-14 MED ORDER — CEFDINIR 300 MG PO CAPS: 300.0000 mg | ORAL_CAPSULE | Freq: Two times a day (BID) | ORAL | Status: DC

## 2016-02-14 MED ORDER — ALBUTEROL SULFATE HFA 108 (90 BASE) MCG/ACT IN AERS: INHALATION_SPRAY | RESPIRATORY_TRACT | Status: DC

## 2016-02-14 MED ORDER — METHYLPREDNISOLONE ACETATE 80 MG/ML IJ SUSP
80.0000 mg | Freq: Once | INTRAMUSCULAR | Status: AC
  Administered 2016-02-14: 80 mg via INTRAMUSCULAR

## 2016-02-14 MED ORDER — ALBUTEROL SULFATE (2.5 MG/3ML) 0.083% IN NEBU: INHALATION_SOLUTION | RESPIRATORY_TRACT | Status: DC

## 2016-02-14 NOTE — Assessment & Plan Note (Signed)
We will try to help him get his nebulizer medication through his DME company with Medicare

## 2016-02-14 NOTE — Assessment & Plan Note (Signed)
Not using CPAP, making him feel "smothered". Sleeps with head elevated in recliner and wears oxygen

## 2016-02-14 NOTE — Assessment & Plan Note (Signed)
Admits significant anxiety now about the status of his lung cancer following recent needle biopsy. He knows he has cancer now in both lungs and is awaiting return to oncology to discuss.

## 2016-02-14 NOTE — Patient Instructions (Addendum)
Script sent for cefdinir antibiotic to take if needed  Script printed so we can try to help you get your nebulizer med from [facility] under Medicare  Use your O2 all the time if you need to 2-4 L/M  Depo 80 COPD mixed type

## 2016-02-14 NOTE — Assessment & Plan Note (Signed)
O2 saturation was actually pretty good sitting at rest today but it drops immediately with any activity and at night while sleeping. Plan-keep oxygen with him and uses it 3-4 L during exertion if needed

## 2016-02-14 NOTE — Progress Notes (Signed)
Subjective:    Patient ID: Ernest [name], male    DOB: July 19, 1956, 60 y.o.   MRN: [id number]  HPI 60 year old male patient with COPD/ bronchitis, OSA /CPAP, oxygen and steroid dependent, chronic respiratory failure.Pt was diagnosed with Squamous cell CA LUL in 2014 and was treated with XRT and Chemotherapy. 12/2015: suspected recurrence of squamous cell lung cancer per CT with enlargement of RIGHT lower lobe nodule which is concerning for a pulmonary metastasis. Follow up and plan of care per oncology. Significant Events: Recent Hospitalization: Admit date: 11/24/2015 Discharge date: 11/30/2015   Discharge Diagnoses:  Principal Problem:  Acute on chronic respiratory failure with hypoxia and hypercapnia (HCC) Active Problems:  Essential hypertension  COPD with asthma ([place])  Diabetes mellitus without complication (HCC)  COPD exacerbation ([place])  Acute respiratory failure with hypercapnia (HCC)  Sepsis due to pneumonia ([place]) Flu Swab : Negative Respiratory viral panel negative  Treated with 6 days of IV antibiotic therapy with ceftriaxone and azithromycin/ Prednisone.  01/12/16 CT Chest/ contrast:  IMPRESSION: 1. Significant enlargement of RIGHT lower lobe nodule is concerning for a pulmonary metastasis. Recommend tissue sampling with bronchoscopy or percutaneous biopsy. 2. Stable postsurgical and post therapy change in the LEFT upper lobe. 3. Stable small mediastinal lymph nodes. CXR:01/31/16 IMPRESSION: 1. Apparent interval enlargement of the right lower lobe pulmonary nodule consistent with either enlarging pulmonary metastasis or primary lung carcinoma. 2. Stable pleural parenchymal scarring in the left lung apex.  01/31/16:Hospital Follow Up: Patient presents to the office with continued dyspnea. He stated he thought He was going to have to call EMS last night. Chest x-ray today indicates Significant enlargement of right lower lobe pulmonary nodule in comparison  to CT scan done 01/12/2016, but no active infectious process. Patient has appointment with Dr. Earlie Server in oncology tomorrow 02/01/2016 for follow-up regarding treatment of suspected recurrent squamous cell carcinoma. He states he is not coughing up any discolored sputum. What he does cough up is white to clear. He continues to use his Mucinex as he feels like he has chest congestion that is not clearing with cough. He continues to take his maintenance prednisone dose of 5 mg daily. He is using his oxygen at home at 3 L nasal cannula, but has come to the office today without his oxygen. He denies chest pain, orthopnea, or  Hemoptysis.  02/14/2016-60 year old male former smoker with COPD/ bronchitis, OSA /CPAP, oxygen and steroid dependent, chronic respiratory failure.Pt was diagnosed with Squamous cell CA LUL in 2014 and was treated with XRT and Chemotherapy. 12/2015: suspected recurrence of squamous cell lung cancer per CT with enlargement of RIGHT lower lobe nodule which is concerning for a pulmonary metastasis CPAP 10/[employer]-not wearing, made him feel "smothered" O2 2-3 liters/[employer] FOLLOWS FOR: pt c/o unchanged sob with any exertion, prod cough with white mucus, lightneadedness.   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 He now reports increased and deeper cough, head congestion, easier dyspnea on exertion, more frequent need for nebulizer treatments. He left his oxygen in the car to come up for this visit. Orthopnea-sleeps upright in chair  ROS-see HPI   Negative unless "+" Constitutional:    weight loss, night sweats, fevers, chills, fatigue, lassitude. HEENT:    headaches, difficulty swallowing, tooth/dental problems, sore throat,       sneezing, itching, ear ache, nasal congestion, post nasal drip, snoring CV:    chest pain, orthopnea, PND, swelling in lower extremities, anasarca,  dizziness, palpitations Resp:   + shortness of  breath with exertion or at rest.                productive cough,  + non-productive cough, coughing up of blood.              change in color of mucus.  + wheezing.   Skin:    rash or lesions. GI:  No-   heartburn, indigestion, abdominal pain, nausea, vomiting, diarrhea,                 change in bowel habits, loss of appetite GU: dysuria, change in color of urine, no urgency or frequency.   flank pain. MS:   joint pain, stiffness, decreased range of motion, back pain. Neuro-     nothing unusual Psych:  change in mood or affect.  depression or anxiety.   memory loss.    Objective:  OBJ- Physical Exam General- Alert, Oriented, Affect-appropriate, Distress- none acute Skin- rash-none, lesions- none, excoriation- none Lymphadenopathy- none Head- atraumatic            Eyes- Gross vision intact, PERRLA, conjunctivae and secretions clear            Ears- Hearing, canals-normal            Nose- Clear, no-Septal dev, mucus, polyps, erosion, perforation             Throat- Mallampati II , mucosa clear , drainage- none, tonsils- atrophic Neck- flexible , trachea midline, no stridor , thyroid nl, carotid no bruit Chest - symmetrical excursion , unlabored           Heart/CV- RRR , no murmur , no gallop  , no rub, nl s1 s2                           - JVD- none , edema- none, stasis changes- none, varices- none           Lung- clear to P&A/Diminished, wheeze- none, cough- none , dullness-none, rub- none--- 96% room air at rest           Chest wall-  Abd-  Br/ Gen/ Rectal- Not done, not indicated Extrem- cyanosis- none, clubbing, none, atrophy- none, strength- nl Neuro- grossly intact to observation    Assessment & Plan:

## 2016-02-15 ENCOUNTER — Other Ambulatory Visit: Payer: Self-pay | Admitting: *Deleted

## 2016-02-15 NOTE — Patient Outreach (Signed)
[facility]) Care Management [facility], Transition of Care Telephone outreach, week 4 02/15/2016  Ernest [name] [birth year] [id number]  Ernest [name] is an 60 y.o. male followed by [facility] for transition of care after recent inpatient hospital visit, as well as ongoing self management for COPD. He was discharged home on January 21, 2016.  Patient states that he is doing "pretty well today" after an appointment with his pulmonogist yesterday.  He reports that his wife has been sick, and stated that Dr. Annamaria Boots put him on antibiotics as well, as Ernest [name] also had symptoms of a "bad cold."  He stated that he is taking the antibiotics and "feels pretty good today."  He stated that he has continued checking and recording his SaO2 levels daily, and states the values have ranged between 83-92% "depending on what I am doing at the time."  Ernest [name] also acknowledged that he has a scheduled office visit planned with [name] on February 20, 2016, to "find out what the biopsy showed."  Mr. Hippe reported that he has had no additional issues or problems today.  I confirmed our next home visit appointment for March 05, 2016.  I also confirmed that Ernest [name] has my contact information and he agreed to call me if he has any new problems or issues develop before our next scheduled home visit.  Plan: Ernest [name] will keep all upcoming provider appointments. Ernest [name] will continue taking all medications as prescribed by his providers. Ernest [name] will continue monitoring and recording his oxygen levels daily. [place] home visit planned for March 05, 2016 at 11:00 am.  [name], RN, BSN, [facility]  [phone]

## 2016-02-16 ENCOUNTER — Encounter (HOSPITAL_COMMUNITY): Payer: Self-pay

## 2016-02-16 DIAGNOSIS — J449 Chronic obstructive pulmonary disease, unspecified: Secondary | ICD-10-CM | POA: Diagnosis not present

## 2016-02-19 NOTE — Patient Outreach (Signed)
[facility]  neous encounter

## 2016-02-20 ENCOUNTER — Telehealth: Payer: Self-pay | Admitting: Internal Medicine

## 2016-02-20 ENCOUNTER — Telehealth: Payer: Self-pay | Admitting: *Deleted

## 2016-02-20 ENCOUNTER — Encounter: Payer: Self-pay | Admitting: Internal Medicine

## 2016-02-20 ENCOUNTER — Ambulatory Visit (HOSPITAL_BASED_OUTPATIENT_CLINIC_OR_DEPARTMENT_OTHER): Payer: Commercial Managed Care - HMO | Admitting: Internal Medicine

## 2016-02-20 VITALS — BP 131/60 | HR 98 | Temp 98.8°F | Resp 18 | Ht 66.0 in | Wt 199.4 lb

## 2016-02-20 DIAGNOSIS — Z5111 Encounter for antineoplastic chemotherapy: Secondary | ICD-10-CM

## 2016-02-20 DIAGNOSIS — Z7189 Other specified counseling: Secondary | ICD-10-CM | POA: Insufficient documentation

## 2016-02-20 DIAGNOSIS — C7801 Secondary malignant neoplasm of right lung: Secondary | ICD-10-CM

## 2016-02-20 DIAGNOSIS — C3491 Malignant neoplasm of unspecified part of right bronchus or lung: Secondary | ICD-10-CM | POA: Diagnosis not present

## 2016-02-20 MED ORDER — PROCHLORPERAZINE MALEATE 10 MG PO TABS: 10.0000 mg | ORAL_TABLET | Freq: Four times a day (QID) | ORAL | Status: DC | PRN

## 2016-02-20 NOTE — Telephone Encounter (Signed)
per pof to sch pt appt-gave pt copy of avs-[facility] sch trmt

## 2016-02-20 NOTE — Progress Notes (Signed)
Ernest [name] [phone]   [phone]  OFFICE PROGRESS NOTE  [name], MD [address] 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 The patient has recurrence in February 2017. PDL 1 expression 0%.  PRIOR THERAPY:  1) Concurrent chemoradiation with weekly carboplatin for AUC of 2 and paclitaxel 45 mg/M2, status post 7 cycles, last dose was given 12/20/2013. First dose on 11/01/2013. 2) Consolidation chemotherapy with carboplatin for AUC of 5 and paclitaxel 175 mg/M2 every 3 weeks with Neulasta support. First cycle on 02/08/2014. Status post 3 cycles and carboplatin was discontinued secondary to allergic reaction.    CURRENT THERAPY: Abraxane 100 MG/M2 on days 1 and 8 every 3 weeks  CHEMOTHERAPY INTENT: Curative/control  CURRENT # OF CHEMOTHERAPY CYCLES: 0  CURRENT ANTIEMETICS: Zofran, dexamethasone and Compazine  CURRENT SMOKING STATUS: Former smoker  ORAL CHEMOTHERAPY AND CONSENT: None  CURRENT BISPHOSPHONATES USE: None  PAIN MANAGEMENT: 0/10  NARCOTICS INDUCED CONSTIPATION: None.  LIVING WILL AND CODE STATUS: Full code.   INTERVAL HISTORY: Ernest [name] 60 y.o. male returns to the clinic today for four-month followup visit accompanied by his wife. The patient is feeling fine today with no specific complaints except for shortness of breath with exertion secondary to COPD. who was found recently to have evidence for disease recurrence in the right lung presenting with right lower lobe lung nodule in addition to mediastinal lymphadenopathy. The patient underwent CT-guided core biopsy of the right lower lobe lung nodule and the final pathology  was consistent with poorly differentiated non-small cell carcinoma. He is here today for evaluation and discussion of his treatment options. He denied having any significant chest pain or hemoptysis. He denied having any nausea or vomiting, no fever or chills.   MEDICAL HISTORY: Past Medical History  Diagnosis Date  . CAD (coronary artery disease)     Left Main 30% stenosis, LAD 20 - 30 % stenosis, first and second diagonal branchesat 40 - 50%  stenosis with small arteries, circumflex had 30% stenosis in the large obtuse marginal, RCA at 70 - 80%  stenosis [not felt to be occlusive after evaluation with flow wire], distal 50 - 60% stenosis - [name][  . COPD (chronic obstructive pulmonary disease) (HCC)     Dr. Baird Lyons  . Depression   . Anxiety   . Hyperlipidemia   . Chronic insomnia   . Gout   . GERD (gastroesophageal reflux disease)   . PVD (peripheral vascular disease) (HCC)     PTA/Stent right common iliac  . DDD (degenerative disc disease)   . Hx of colonoscopy   . COPD with asthma ([facility]) 09/08/2007  . OSA (obstructive sleep apnea)     NPSG 09/10/10- AHI 11.3/hr  . Hypertension     dr Percival Spanish  . History of radiation therapy 11/10/13- 12/29/13    left lung 6600 cGy in 33 sessions  . Diabetes mellitus without complication ([facility]) 8/52/7782  . Cancer ([facility])     lung  . Lung cancer ([facility]) 10/04/13    LUL squamous cell lung cancer  . History of cardiovascular stress test     Myoview 7/16:  Diaphragmatic attenuation, no ischemia, EF 56%; Low Risk    ALLERGIES:  is allergic to  carboplatin.  MEDICATIONS:  Current Outpatient Prescriptions  Medication Sig Dispense Refill  . ADVAIR DISKUS 500-50 MCG/DOSE AEPB 1 PUFF THEN RINSE MOUTH, TWICE DAILY MAINTENANCE 60 each 2  . albuterol (PROVENTIL) (2.5 MG/3ML) 0.083% nebulizer solution INHALE 1 VIAL IN NEBULIZER EVERY 4 HOURS AS NEEDED FOR WHEEZING OR SHORTNESS OF BREATH 300 mL 12  . albuterol (VENTOLIN HFA) 108 (90 Base) MCG/ACT  inhaler INHALE 2 PUFFS INTO THE LUNGS 4 TIMES DAILY AS NEEDED FOR WHEEZING 18 Inhaler 11  . ARIPiprazole (ABILIFY) 5 MG tablet Take 1 tablet (5 mg total) by mouth at bedtime.    . Armodafinil 150 MG tablet Take 150 mg by mouth every morning.  5  . aspirin 81 MG EC tablet TAKE 1 TABLET BY MOUTH EVERY DAY 30 tablet 5  . atorvastatin (LIPITOR) 20 MG tablet TAKE 1 TABLET EVERY DAY 90 tablet 3  . BELSOMRA 20 MG TABS Take 1 tablet by mouth at bedtime. Take 1 tab by mouth daily at bed time for sleep prn  4  . cefdinir (OMNICEF) 300 MG capsule Take 1 capsule (300 mg total) by mouth 2 (two) times daily. 14 capsule 0  . clonazePAM (KLONOPIN) 1 MG tablet Take 2 mg by mouth at bedtime as needed for anxiety. Reported on 01/16/2016    . clopidogrel (PLAVIX) 75 MG tablet Take 1 tablet (75 mg total) by mouth daily. 90 tablet 3  . dextromethorphan-guaiFENesin (MUCINEX DM) 30-600 MG 12hr tablet Take 1 tablet by mouth 2 (two) times daily as needed for cough (congestion).    [name] esomeprazole (NEXIUM) 40 MG capsule Take 40 mg by mouth every morning.     [name] FLUoxetine (PROZAC) 40 MG capsule Take 40 mg by mouth every morning.    . gabapentin (NEURONTIN) 100 MG capsule Take 100 mg by mouth daily.     . isosorbide mononitrate (IMDUR) 60 MG 24 hr tablet Take 60 mg by mouth daily.    . methocarbamol (ROBAXIN) 500 MG tablet Take 500 mg by mouth 2 (two) times daily as needed for muscle spasms. Reported on 01/16/2016    . metoprolol succinate (TOPROL-XL) 25 MG 24 hr tablet TAKE 1/2 TABLET BY MOUTH DAILY 30 tablet 3  . mirtazapine (REMERON) 45 MG tablet Take 1 tablet (45 mg total) by mouth at bedtime.    [name] morphine (MS CONTIN) 30 MG 12 hr tablet Take 1 tablet (30 mg total) by mouth every 12 (twelve) hours. 60 tablet 0  . morphine (MSIR) 15 MG tablet Take 1 tablet (15 mg total) by mouth every 4 (four) hours as needed for severe pain. 30 tablet 0  . nitroGLYCERIN (NITROSTAT) 0.4 MG SL tablet Place 1 tablet (0.4 mg total) under the  tongue every 5 (five) minutes as needed for chest pain. 25 tablet 3  . OXYGEN Inhale 2 L into the lungs at bedtime as needed.    . potassium chloride SA (K-DUR,KLOR-CON) 20 MEQ tablet Take 1 tablet (20 mEq total) by mouth every morning. Reported on 12/07/2015 90 tablet 1  . predniSONE (DELTASONE) 5 MG tablet Take 5 mg by mouth daily.    . roflumilast (DALIRESP) 500 MCG TABS tablet Take 1 tablet (500 mcg total) by mouth daily. 30 tablet 11  . tamsulosin (FLOMAX) 0.4 MG CAPS capsule Take 1 capsule by mouth daily.  11  . theophylline (UNIPHYL) 400 MG 24 hr tablet Take 400 mg by mouth daily.  5  . arformoterol (BROVANA) 15 MCG/2ML NEBU Take 2 mLs (15 mcg  total) by nebulization 2 (two) times daily. 8 mL 0   No current facility-administered medications for this visit.   Facility-Administered Medications Ordered in Other Visits  Medication Dose Route Frequency Provider Last Rate Last Dose  . DOBUTamine (DOBUTREX) 1,000 mcg/mL in dextrose 5% 250 mL infusion  0-40 mcg/kg/min Intravenous Continuous Fay Records, MD 224.2 mL/hr at 06/12/15 1117 40 mcg/kg/min at 06/12/15 1117    SURGICAL HISTORY:  Past Surgical History  Procedure Laterality Date  . Spinal fusion  03/05/2007    L4-L5  . Hip surgery      left 'bone graft taken"  . Arm surgery      left elbow  . Shoulder surgery      right  . C-spine surgery    . Angioplasty    . Video bronchoscopy with endobronchial navigation N/A 10/04/2013    Procedure: VIDEO BRONCHOSCOPY WITH ENDOBRONCHIAL NAVIGATION;  Surgeon: Collene Gobble, MD;  Location: [facility];  Service: Thoracic;  Laterality: N/A;  . Back surgery      lower  . Anterior fusion cervical spine      cervical fusion  7 yrs ago ([facility])  . Colon surgery      '11 "Diverticulitis"  . Colonoscopy with propofol N/A 05/22/2015    Procedure: COLONOSCOPY WITH PROPOFOL;  Surgeon: Inda Castle, MD;  Location: [facility];  Service: Endoscopy;  Laterality: N/A;  . Peripheral vascular catheterization  N/A 08/10/2015    Procedure: Lower Extremity Angiography;  Surgeon: Lorretta Harp, MD; Distal Ao OK, L-EIA stent OK, R-CIA 100% s/p overlapping 7 mm x 38 mm ICast stents, 50-60% R-CFA        REVIEW OF SYSTEMS:  Constitutional: positive for fatigue Eyes: negative Ears, nose, mouth, throat, and face: negative Respiratory: positive for cough and dyspnea on exertion Cardiovascular: negative Gastrointestinal: negative Genitourinary:negative Integument/breast: negative Hematologic/lymphatic: negative Musculoskeletal:negative Neurological: negative Behavioral/Psych: negative Endocrine: negative Allergic/Immunologic: negative   PHYSICAL EXAMINATION: General appearance: alert, cooperative and no distress Head: Normocephalic, without obvious abnormality, atraumatic Neck: no adenopathy, no JVD, supple, symmetrical, trachea midline and thyroid not enlarged, symmetric, no tenderness/mass/nodules Lymph nodes: Cervical, supraclavicular, and axillary nodes normal. Resp: wheezes bilaterally Back: symmetric, no curvature. ROM normal. No CVA tenderness. Cardio: regular rate and rhythm, S1, S2 normal, no murmur, click, rub or gallop GI: soft, non-tender; bowel sounds normal; no masses,  no organomegaly Extremities: extremities normal, atraumatic, no cyanosis or edema Neurologic: Alert and oriented X 3, normal strength and tone. Normal symmetric reflexes. Normal coordination and gait  ECOG PERFORMANCE STATUS: 1 - Symptomatic but completely ambulatory  Blood pressure 131/60, pulse 98, temperature 98.8 F (37.1 C), temperature source Oral, resp. rate 18, height '5\' 6"'$  (1.676 m), weight 199 lb 6.4 oz (90.447 kg), SpO2 90 %.  LABORATORY DATA: Lab Results  Component Value Date   WBC 9.0 02/06/2016   HGB 14.0 02/06/2016   HCT 42.5 02/06/2016   MCV 90.0 02/06/2016   PLT 110* 02/06/2016      Chemistry      Component Value Date/Time   NA 139 01/18/2016 0236   NA 140 01/11/2016 0954   K 4.3  01/18/2016 0236   K 3.7 01/11/2016 0954   CL 98* 01/18/2016 0236   CO2 26 01/18/2016 0236   CO2 31* 01/11/2016 0954   BUN 9 01/18/2016 0236   BUN 9.2 01/11/2016 0954   CREATININE 1.07 01/18/2016 0236   CREATININE 1.0 01/11/2016 0954   CREATININE 0.80 08/07/2015 0959  Component Value Date/Time   CALCIUM 9.1 01/18/2016 0236   CALCIUM 9.1 01/11/2016 0954   ALKPHOS 113 01/18/2016 0236   ALKPHOS 120 01/11/2016 0954   AST 26 01/18/2016 0236   AST 19 01/11/2016 0954   ALT 24 01/18/2016 0236   ALT 21 01/11/2016 0954   BILITOT 0.3 01/18/2016 0236   BILITOT 0.66 01/11/2016 0954       RADIOGRAPHIC STUDIES: Dg Chest 2 View  01/31/2016  CLINICAL DATA:  Shortness of breath, cough, congestion for 7 days, history of left upper lobe squamous cell carcinoma EXAM: CHEST  2 VIEW COMPARISON:  PET-CT of 01/23/2016, portable chest 02232 7,[id number], and CT chest of 01/12/2016 FINDINGS: The nodule overlying the right hilum located in the posterior medial right lower lobe appears to have enlarged since the prior CT of the chest. On the CT of the chest this lesion measured 19 x 14 mm, currently measuring 25 mm on the frontal chest x-ray, consistent with an enlarging metastatic deposit or primary lung carcinoma. No additional lung nodule is seen. The lungs are hyperaerated. Left apical pleural parenchymal scarring is stable. The heart is mildly enlarged and stable. A lower anterior cervical spine fusion plate is again noted. IMPRESSION: 1. Apparent interval enlargement of the right lower lobe pulmonary nodule consistent with either enlarging pulmonary metastasis or primary lung carcinoma. 2. Stable pleural parenchymal scarring in the left lung apex. Electronically Signed   By: Ivar Drape M.D.   On: 01/31/2016 15:20   Nm Pet Image Initial (pi) Skull Base To Thigh  01/23/2016  CLINICAL DATA:  Subsequent treatment strategy for left squamous cell lung cancer. New right lung nodule. EXAM: NUCLEAR MEDICINE PET SKULL  BASE TO THIGH TECHNIQUE: 10.7 mCi F-18 FDG was injected intravenously. Full-ring PET imaging was performed from the skull base to thigh after the radiotracer. CT data was obtained and used for attenuation correction and anatomic localization. FASTING BLOOD GLUCOSE:  Value: 194 mg/dl COMPARISON:  CT chest dated 01/12/2016.  PET-CT dated 10/19/2013. FINDINGS: NECK No hypermetabolic lymph nodes in the neck. CHEST 1.6 x 1.9 cm right lower lobe pulmonary nodule (series 6/ image 41), max SUV 5.2, suspicious for metastasis. Radiation changes in the left lung apex/posterior left upper lobe. Associated central cavitation in the left lung apex (series 6/ image 13). Mild residual hypermetabolism, max SUV 4.8, without focal FDG avidity. Overall, this appearance remains compatible with post treatment changes. No pleural effusion or pneumothorax. Heart is normal in size. No pericardial effusion. Three vessel coronary atherosclerosis. Small mediastinal lymph nodes, including: --10 mm short axis right paratracheal node (series 4/image 63), max SUV 3.2 --9 mm short axis subcarinal node (series 4/image 72), max SUV 5.2 ABDOMEN/PELVIS No abnormal hypermetabolic activity within the liver, pancreas, adrenal glands, or spleen. 4.8 cm cystic lesion in the inferior spleen (series 4/image 119), unchanged since 2014, non FDG avid. Atherosclerotic calcifications the abdominal aorta and branch vessels. Colonic diverticulosis, without evidence of diverticulitis. No hypermetabolic lymph nodes in the abdomen or pelvis. SKELETON No focal hypermetabolic activity to suggest skeletal metastasis. Linear intramuscular activity along the right shoulder and medial left gluteal musculature (PET images 45 and 173), without underlying CT abnormality, favored to be physiologic. IMPRESSION: 1.9 cm hypermetabolic right lower lobe pulmonary nodule, suspicious for metastasis. Small mediastinal nodal metastases. Radiation changes in the left upper lobe/left lung  apex. Electronically Signed   By: Julian Hy M.D.   On: 01/23/2016 16:30   Ct Biopsy  02/06/2016  INDICATION: 60 year old with history of  non-small cell lung cancer. Enlargement of a nodule in the right lower lobe. EXAM: CT-GUIDED CORE BIOPSY OF RIGHT LOWER LOBE LESION MEDICATIONS: None. ANESTHESIA/SEDATION: Moderate (conscious) sedation was employed during this procedure. A total of Versed 1.5 mg and Fentanyl 75 mcg was administered intravenously. Moderate Sedation Time: 13 minutes. The patient's level of consciousness and vital signs were monitored continuously by radiology nursing throughout the procedure under my direct supervision. FLUOROSCOPY TIME:  None COMPLICATIONS: None immediate. PROCEDURE: Informed written consent was obtained from the patient after a thorough discussion of the procedural risks, benefits and alternatives. All questions were addressed. A timeout was performed prior to the initiation of the procedure. Patient was placed supine on the CT scanner. Images through the chest were obtained. The lesion in the right lower lobe was identified and targeted. The right side of the back was prepped with chlorhexidine. A sterile field was created. Skin was anesthetized with 1% lidocaine. 17 gauge coaxial needle was directed into the peripheral lesion with CT guidance. Needle position was confirmed within the lesion. Two core biopsies were obtained with an 18 gauge core device. BioSentry tract sealant was used during removal of the 17 gauge coaxial needle. Needle was removed without complication. Bandage placed over the puncture site. Follow up CT images were obtained. FINDINGS: 3.1 cm lesion along the periphery of the right lower lobe superior segment. Needle position confirmed within the lesion. No significant bleeding or pneumothorax following the core biopsies. IMPRESSION: Successful CT-guided core biopsies of right lower lobe lesion. Electronically Signed   By: Markus Daft M.D.   On:  02/06/2016 10:30   Dg Chest Port 1 View  02/06/2016  CLINICAL DATA:  Status post right lung biopsy EXAM: PORTABLE CHEST 1 VIEW COMPARISON:  01/31/2016 FINDINGS: No pneumothorax following right lung biopsy. The right lower lobe pulmonary nodule is superimposed over the right hilum, unchanged. No confluent opacity on the left. Heart is normal size. No effusions. IMPRESSION: No pneumothorax following right lung biopsy. Electronically Signed   By: Rolm Baptise M.D.   On: 02/06/2016 10:32    ASSESSMENT AND PLAN: This is a very pleasant 60 years old white male with stage IIIA non-small cell lung cancer, squamous cell carcinoma currently undergoing concurrent chemoradiation with weekly carboplatin and paclitaxel status post 7 weeks of treatment. He tolerated his treatment fairly well with no significant adverse effects. This was followed by consolidation chemotherapy with carboplatin and paclitaxel status post 3 cycles, .carboplatin was discontinued after cycle #2 secondary to hypersensitivity reaction. The recent CT scan of the chest progressive enlargement of the right lower lobe lung nodule concerning for pulmonary metastasis or new synchronous tumor. The recent PET scan showed hypermetabolic activity in the 1.9 cm right lower lobe pulmonary nodule suspicious for metastasis. There was also small mediastinal node metastasis. CT-guided core biopsy of the right lower lobe pulmonary nodule showed poorly differentiated carcinoma. PDL 1 expression was 0% I had a lengthy discussion with the patient and his wife today about his current disease status and treatment options. I discussed with the patient the option of palliative care versus consideration of systemic chemotherapy with single agent Abraxane 100 mg/M2 on days 1 and 8 every 3 weeks. The patient has hypersensitivity reaction to carboplatin. He is also noted good candidate for immunotherapy as first line option because of the 0% expression of PDL 1. The  patient is interested in proceeding with systemic chemotherapy. I discussed with them the adverse effect of this treatment including but not limited to  alopecia, myelosuppression, nausea and vomiting, peripheral neuropathy, liver or renal dysfunction. He is expected to start the first cycle of his treatment next week. The patient would come back for follow-up visit in 4 weeks with the start of cycle #2. He was advised to call immediately if he has any concerning symptoms in the interval. The patient voices understanding of current disease status and treatment options and is in agreement with the current care plan.  All questions were answered. The patient knows to call the clinic with any problems, questions or concerns. We can certainly see the patient much sooner if necessary.  Disclaimer: This note was dictated with voice recognition software. Similar sounding words can inadvertently be transcribed and may not be corrected upon review.

## 2016-02-20 NOTE — Telephone Encounter (Signed)
Per staff phone call and POF I have schedueld appts. Scheduler advised of appts.  JMW  

## 2016-02-20 NOTE — Addendum Note (Signed)
Addended by: Verlin Grills on: 02/20/2016 10:06 AM   Modules accepted: Level of Service, SmartSet

## 2016-02-20 NOTE — Progress Notes (Signed)
This encounter was created in error - please disregard.

## 2016-02-22 DIAGNOSIS — J449 Chronic obstructive pulmonary disease, unspecified: Secondary | ICD-10-CM | POA: Diagnosis not present

## 2016-02-22 DIAGNOSIS — J9601 Acute respiratory failure with hypoxia: Secondary | ICD-10-CM | POA: Diagnosis not present

## 2016-02-22 DIAGNOSIS — C342 Malignant neoplasm of middle lobe, bronchus or lung: Secondary | ICD-10-CM | POA: Diagnosis not present

## 2016-02-26 ENCOUNTER — Other Ambulatory Visit: Payer: Self-pay | Admitting: *Deleted

## 2016-02-26 DIAGNOSIS — C3490 Malignant neoplasm of unspecified part of unspecified bronchus or lung: Secondary | ICD-10-CM

## 2016-02-27 ENCOUNTER — Ambulatory Visit (HOSPITAL_BASED_OUTPATIENT_CLINIC_OR_DEPARTMENT_OTHER): Payer: Commercial Managed Care - HMO

## 2016-02-27 ENCOUNTER — Other Ambulatory Visit: Payer: Self-pay | Admitting: *Deleted

## 2016-02-27 ENCOUNTER — Other Ambulatory Visit (HOSPITAL_BASED_OUTPATIENT_CLINIC_OR_DEPARTMENT_OTHER): Payer: Commercial Managed Care - HMO

## 2016-02-27 VITALS — BP 155/63 | HR 74 | Temp 97.1°F

## 2016-02-27 DIAGNOSIS — Z5111 Encounter for antineoplastic chemotherapy: Secondary | ICD-10-CM

## 2016-02-27 DIAGNOSIS — C3490 Malignant neoplasm of unspecified part of unspecified bronchus or lung: Secondary | ICD-10-CM

## 2016-02-27 DIAGNOSIS — C3491 Malignant neoplasm of unspecified part of right bronchus or lung: Secondary | ICD-10-CM | POA: Diagnosis not present

## 2016-02-27 LAB — CBC WITH DIFFERENTIAL/PLATELET
BASO%: 0.6 % (ref 0.0–2.0)
Basophils Absolute: 0.1 10*3/uL (ref 0.0–0.1)
EOS ABS: 0.1 10*3/uL (ref 0.0–0.5)
EOS%: 1 % (ref 0.0–7.0)
HEMATOCRIT: 42.5 % (ref 38.4–49.9)
HEMOGLOBIN: 13.5 g/dL (ref 13.0–17.1)
LYMPH#: 0.6 10*3/uL — AB (ref 0.9–3.3)
LYMPH%: 4.6 % — AB (ref 14.0–49.0)
MCH: 28 pg (ref 27.2–33.4)
MCHC: 31.8 g/dL — ABNORMAL LOW (ref 32.0–36.0)
MCV: 88.1 fL (ref 79.3–98.0)
MONO#: 0.9 10*3/uL (ref 0.1–0.9)
MONO%: 7.3 % (ref 0.0–14.0)
NEUT%: 86.5 % — ABNORMAL HIGH (ref 39.0–75.0)
NEUTROS ABS: 10.8 10*3/uL — AB (ref 1.5–6.5)
PLATELETS: 123 10*3/uL — AB (ref 140–400)
RBC: 4.82 10*6/uL (ref 4.20–5.82)
RDW: 15.4 % — ABNORMAL HIGH (ref 11.0–14.6)
WBC: 12.5 10*3/uL — AB (ref 4.0–10.3)

## 2016-02-27 LAB — COMPREHENSIVE METABOLIC PANEL
ALBUMIN: 3.4 g/dL — AB (ref 3.5–5.0)
ALK PHOS: 127 U/L (ref 40–150)
ALT: 18 U/L (ref 0–55)
ANION GAP: 8 meq/L (ref 3–11)
AST: 16 U/L (ref 5–34)
BILIRUBIN TOTAL: 0.59 mg/dL (ref 0.20–1.20)
BUN: 9.1 mg/dL (ref 7.0–26.0)
CALCIUM: 9.5 mg/dL (ref 8.4–10.4)
CO2: 31 mEq/L — ABNORMAL HIGH (ref 22–29)
CREATININE: 1 mg/dL (ref 0.7–1.3)
Chloride: 101 mEq/L (ref 98–109)
EGFR: 84 mL/min/{1.73_m2} — AB (ref >90)
Glucose: 243 mg/dl — ABNORMAL HIGH (ref 70–140)
Potassium: 4.2 mEq/L (ref 3.5–5.1)
Sodium: 140 mEq/L (ref 136–145)
TOTAL PROTEIN: 6.6 g/dL (ref 6.4–8.3)

## 2016-02-27 MED ORDER — PACLITAXEL PROTEIN-BOUND CHEMO INJECTION 100 MG
100.0000 mg/m2 | Freq: Once | INTRAVENOUS | Status: AC
  Administered 2016-02-27: 200 mg via INTRAVENOUS
  Filled 2016-02-27: qty 40

## 2016-02-27 MED ORDER — PROCHLORPERAZINE MALEATE 10 MG PO TABS
ORAL_TABLET | ORAL | Status: AC
Start: 2016-02-27 — End: 2016-02-27
  Filled 2016-02-27: qty 1

## 2016-02-27 MED ORDER — SODIUM CHLORIDE 0.9 % IV SOLN
Freq: Once | INTRAVENOUS | Status: AC
  Administered 2016-02-27: 12:00:00 via INTRAVENOUS

## 2016-02-27 MED ORDER — PROCHLORPERAZINE MALEATE 10 MG PO TABS
10.0000 mg | ORAL_TABLET | Freq: Once | ORAL | Status: AC
  Administered 2016-02-27: 10 mg via ORAL

## 2016-02-27 NOTE — Patient Instructions (Signed)
[facility] Discharge Instructions for Patients Receiving Chemotherapy  Today you received the following chemotherapy agents Abraxane To help prevent nausea and vomiting after your treatment, we encourage you to take your nausea medication as prescribed.   If you develop nausea and vomiting that is not controlled by your nausea medication, call the clinic.   BELOW ARE SYMPTOMS THAT SHOULD BE REPORTED IMMEDIATELY:  *FEVER GREATER THAN 100.5 F  *CHILLS WITH OR WITHOUT FEVER  NAUSEA AND VOMITING THAT IS NOT CONTROLLED WITH YOUR NAUSEA MEDICATION  *UNUSUAL SHORTNESS OF BREATH  *UNUSUAL BRUISING OR BLEEDING  TENDERNESS IN MOUTH AND THROAT WITH OR WITHOUT PRESENCE OF ULCERS  *URINARY PROBLEMS  *BOWEL PROBLEMS  UNUSUAL RASH Items with * indicate a potential emergency and should be followed up as soon as possible.  Feel free to call the clinic you have any questions or concerns. The clinic phone number is [phone].  Please show the CHEMO ALERT CARD at check-in to the Emergency Department and triage nurse.   

## 2016-02-28 ENCOUNTER — Telehealth: Payer: Self-pay | Admitting: Medical Oncology

## 2016-02-28 NOTE — Telephone Encounter (Signed)
Diarrhea x 3 times last night then stopped and a sore throat today. I told him to let us know by Friday am if his throat is no better. He voices understanding.

## 2016-02-28 NOTE — Progress Notes (Signed)
This encounter was created in error - please disregard.

## 2016-02-28 NOTE — Addendum Note (Signed)
Addended by: Danella Maiers on: 02/28/2016 02:54 PM   Modules accepted: Level of Service, SmartSet

## 2016-02-29 ENCOUNTER — Ambulatory Visit: Payer: Commercial Managed Care - HMO | Admitting: Internal Medicine

## 2016-03-01 DIAGNOSIS — F3341 Major depressive disorder, recurrent, in partial remission: Secondary | ICD-10-CM | POA: Diagnosis not present

## 2016-03-03 DIAGNOSIS — J449 Chronic obstructive pulmonary disease, unspecified: Secondary | ICD-10-CM | POA: Diagnosis not present

## 2016-03-05 ENCOUNTER — Other Ambulatory Visit: Payer: Self-pay | Admitting: *Deleted

## 2016-03-05 ENCOUNTER — Other Ambulatory Visit: Payer: Self-pay | Admitting: Medical Oncology

## 2016-03-05 ENCOUNTER — Other Ambulatory Visit (HOSPITAL_BASED_OUTPATIENT_CLINIC_OR_DEPARTMENT_OTHER): Payer: Commercial Managed Care - HMO

## 2016-03-05 ENCOUNTER — Other Ambulatory Visit: Payer: Commercial Managed Care - HMO

## 2016-03-05 ENCOUNTER — Ambulatory Visit (HOSPITAL_BASED_OUTPATIENT_CLINIC_OR_DEPARTMENT_OTHER): Payer: Commercial Managed Care - HMO

## 2016-03-05 VITALS — BP 140/73 | HR 82 | Temp 97.9°F | Resp 18

## 2016-03-05 DIAGNOSIS — C3491 Malignant neoplasm of unspecified part of right bronchus or lung: Secondary | ICD-10-CM

## 2016-03-05 DIAGNOSIS — R112 Nausea with vomiting, unspecified: Secondary | ICD-10-CM

## 2016-03-05 DIAGNOSIS — Z5111 Encounter for antineoplastic chemotherapy: Secondary | ICD-10-CM | POA: Diagnosis not present

## 2016-03-05 DIAGNOSIS — T50905A Adverse effect of unspecified drugs, medicaments and biological substances, initial encounter: Principal | ICD-10-CM

## 2016-03-05 LAB — COMPREHENSIVE METABOLIC PANEL
ALBUMIN: 3.3 g/dL — AB (ref 3.5–5.0)
ALK PHOS: 117 U/L (ref 40–150)
ALT: 21 U/L (ref 0–55)
AST: 19 U/L (ref 5–34)
Anion Gap: 9 mEq/L (ref 3–11)
BILIRUBIN TOTAL: 0.61 mg/dL (ref 0.20–1.20)
BUN: 7.5 mg/dL (ref 7.0–26.0)
CALCIUM: 9.3 mg/dL (ref 8.4–10.4)
CO2: 29 mEq/L (ref 22–29)
CREATININE: 0.9 mg/dL (ref 0.7–1.3)
Chloride: 100 mEq/L (ref 98–109)
EGFR: 88 mL/min/{1.73_m2} — ABNORMAL LOW (ref >90)
GLUCOSE: 275 mg/dL — AB (ref 70–140)
Potassium: 4.2 mEq/L (ref 3.5–5.1)
Sodium: 138 mEq/L (ref 136–145)
Total Protein: 6.3 g/dL — ABNORMAL LOW (ref 6.4–8.3)

## 2016-03-05 LAB — CBC WITH DIFFERENTIAL/PLATELET
BASO%: 0.7 % (ref 0.0–2.0)
BASOS ABS: 0 10*3/uL (ref 0.0–0.1)
EOS%: 1.6 % (ref 0.0–7.0)
Eosinophils Absolute: 0.1 10*3/uL (ref 0.0–0.5)
HEMATOCRIT: 38.7 % (ref 38.4–49.9)
HEMOGLOBIN: 12.4 g/dL — AB (ref 13.0–17.1)
LYMPH#: 0.4 10*3/uL — AB (ref 0.9–3.3)
LYMPH%: 7.6 % — ABNORMAL LOW (ref 14.0–49.0)
MCH: 28.1 pg (ref 27.2–33.4)
MCHC: 32 g/dL (ref 32.0–36.0)
MCV: 87.8 fL (ref 79.3–98.0)
MONO#: 0.3 10*3/uL (ref 0.1–0.9)
MONO%: 5.7 % (ref 0.0–14.0)
NEUT%: 84.4 % — ABNORMAL HIGH (ref 39.0–75.0)
NEUTROS ABS: 4.7 10*3/uL (ref 1.5–6.5)
Platelets: 112 10*3/uL — ABNORMAL LOW (ref 140–400)
RBC: 4.41 10*6/uL (ref 4.20–5.82)
RDW: 15.1 % — AB (ref 11.0–14.6)
WBC: 5.5 10*3/uL (ref 4.0–10.3)

## 2016-03-05 MED ORDER — SODIUM CHLORIDE 0.9 % IV SOLN
Freq: Once | INTRAVENOUS | Status: AC
  Administered 2016-03-05: 13:00:00 via INTRAVENOUS
  Filled 2016-03-05: qty 4

## 2016-03-05 MED ORDER — PACLITAXEL PROTEIN-BOUND CHEMO INJECTION 100 MG
100.0000 mg/m2 | Freq: Once | INTRAVENOUS | Status: AC
  Administered 2016-03-05: 200 mg via INTRAVENOUS
  Filled 2016-03-05: qty 40

## 2016-03-05 MED ORDER — SODIUM CHLORIDE 0.9 % IV SOLN
Freq: Once | INTRAVENOUS | Status: AC
  Administered 2016-03-05: 13:00:00 via INTRAVENOUS

## 2016-03-05 MED ORDER — ONDANSETRON HCL 8 MG PO TABS: 8.0000 mg | ORAL_TABLET | Freq: Three times a day (TID) | ORAL | Status: DC | PRN

## 2016-03-05 NOTE — Progress Notes (Signed)
1305: Pt reports that he has been having uncontrolled nausea. He states that the compazine does not help his nausea but that the Zofran he used to take did help. Diane RN (Dr. Worthy Flank nurse) aware. Per Dr. Benay Spice okay to switch PO compazine '10mg'$  to 8 MG Zofran IV, pharmacy aware and orders placed. Pt aware and verbalizes understanding.  PT educated to call clinic if Nausea remains uncontrolled with the Zofran, pt verbalizes understanding. Pt in stable condition at time of discharge.

## 2016-03-05 NOTE — Patient Instructions (Signed)
[facility] Discharge Instructions for Patients Receiving Chemotherapy  Today you received the following chemotherapy agents: Abraxane   To help prevent nausea and vomiting after your treatment, we encourage you to take your nausea medication as directed.    If you develop nausea and vomiting that is not controlled by your nausea medication, call the clinic.   BELOW ARE SYMPTOMS THAT SHOULD BE REPORTED IMMEDIATELY:  *FEVER GREATER THAN 100.5 F  *CHILLS WITH OR WITHOUT FEVER  NAUSEA AND VOMITING THAT IS NOT CONTROLLED WITH YOUR NAUSEA MEDICATION  *UNUSUAL SHORTNESS OF BREATH  *UNUSUAL BRUISING OR BLEEDING  TENDERNESS IN MOUTH AND THROAT WITH OR WITHOUT PRESENCE OF ULCERS  *URINARY PROBLEMS  *BOWEL PROBLEMS  UNUSUAL RASH Items with * indicate a potential emergency and should be followed up as soon as possible.  Feel free to call the clinic you have any questions or concerns. The clinic phone number is [phone].  Please show the CHEMO ALERT CARD at check-in to the Emergency Department and triage nurse.   

## 2016-03-05 NOTE — Patient Outreach (Signed)
[facility]) Care Management   03/05/2016  Ernest [name] 08/02/1956 [id number]  Ernest [name] is an 60 y.o. male  followed by [place] for recent transition of care after recent inpatient hospital visit, as well as ongoing self management for COPD.   Patient states that he is doing "pretty well today" and that he has just realized that he has a conflict with this home visit and his scheduled chemotherapy this afternoon.  [name] said that "my wife is on the way to get me to take me to my chemo this week."  [name] states that he will be getting chemotherapy for the newly diagnosed (R) lung tumor "every Tuesday for 14 weeks."  [name] reports that he started his new chemotherapy schedule last week and "had a very hard time," reporting that he felt sick and very tired for 3 full days after his chemo session.  [name] continues to report ongoing depression and anxiety related to the newly found tumor in his (R) lung.  [name] reported that he has had no additional issues or problems today, and wishes to re-schedule today's home visit.  I contracted with him that I would call him later this week to re-schedule, and he agreed with that plan.  Subjective: "I am so sorry I forgot to call you to cancel our appointment today.  I have to go have chemotherapy today, and I am dreading it."  Objective:    Review of Systems  Constitutional: Negative.   Respiratory: Negative.   Cardiovascular: Negative.     Physical Exam  Constitutional: He is oriented to person, place, and time. He appears well-developed and well-nourished.  Respiratory: Effort normal.  Neurological: He is alert and oriented to person, place, and time.  Skin: Skin is warm and dry.  Psychiatric: He has a normal mood and affect. Judgment and thought content normal.    Assessment:  [name] continues to experience depression and anxiety around his newly  diagnosed (R) lung tumor.  Although he states chemotherapy makes him feel bad, he is committed to "doing whatever it takes," to become healthier and adhere to his medical plan of care.  [name] has continued following his medication regimen, as well as has consistently self-monitored his progress to feel better.  [name] has continued keeping provider appointments, including follow up with his behavioral health providers for anxiety and depression around his chronic health issues.     Plan:  [name] will keep all upcoming provider and chemotherapy appointments. [name] will continue taking all medications as prescribed by his providers. [name] will continue monitoring and recording his oxygen (saO2) levels daily. [place] telephone outreach planned for later this week to re-schedule today's home visit.  [name], RN, BSN, [employer] [facility] Care Management  [phone]

## 2016-03-08 ENCOUNTER — Other Ambulatory Visit: Payer: Self-pay | Admitting: *Deleted

## 2016-03-08 NOTE — Patient Outreach (Signed)
[place] [facility]) Care Management  03/08/2016  Ernest [name] 1956-07-12 [id number]  Unsuccessful telephone outreach to Julianne Handler, a 60 y.o. male followed by [place] for recent transition of care after recent inpatient hospital visit, as well as ongoing self management for COPD, to reschedule our last home visit on Tuesday March 05, 2016, which was modified from a routine home visit, as Mr. Costley had a conflict with his scheduled chemotherapy that day.   HIPPA compliant VM msg with my contact details left for Mr. Angst, asking him to call me back to schedule our next home visit.  Plan: I will reach out to Mr. Thien by telephone next week to schedule our next home visit if I do not hear back from him today.  [name], RN, BSN, [employer] [facility] Care Management  [phone]

## 2016-03-13 ENCOUNTER — Other Ambulatory Visit: Payer: Self-pay | Admitting: *Deleted

## 2016-03-13 NOTE — Patient Outreach (Signed)
[name]  03/13/2016  Ernest [name] [birth year] [id number]  Successful telephone outreach with [name]) Ernest [name], a 60 y.o. male followed by Ernest [name] for recent transition of care after recent inpatient hospital visit, as well as ongoing self management for COPD, to reschedule our last home visit on Tuesday March 05, 2016, which was modified from a routine home visit, as [name] had a conflict with his scheduled chemotherapy that day.   Today, Ernest [name] states that he is feeling pretty good, as he was given a "vacation week" this week from having his chemotherapy.  Ernest [name] denies needs or problems, and had called me back to schedule our next home visit.  I made sure Ernest [name] was aware of my general schedule and had my direct phone contact information, as well as the main [facility] phone number should he have needs arise before our next scheduled visit.  Plan: Ernest [name] will keep all upcoming provider and chemotherapy appointments. Ernest [name] will continue taking all medications as prescribed by his providers. Ernest [name] will continue monitoring and recording his oxygen (saO2) levels daily.  [place] home visit scheduled for next month.   Ernest Rack, RN, BSN, [employer] [facility] Care Management  [phone]

## 2016-03-18 DIAGNOSIS — J449 Chronic obstructive pulmonary disease, unspecified: Secondary | ICD-10-CM | POA: Diagnosis not present

## 2016-03-19 ENCOUNTER — Ambulatory Visit (HOSPITAL_BASED_OUTPATIENT_CLINIC_OR_DEPARTMENT_OTHER): Payer: Commercial Managed Care - HMO | Admitting: Internal Medicine

## 2016-03-19 ENCOUNTER — Other Ambulatory Visit (HOSPITAL_BASED_OUTPATIENT_CLINIC_OR_DEPARTMENT_OTHER): Payer: Commercial Managed Care - HMO

## 2016-03-19 ENCOUNTER — Telehealth: Payer: Self-pay | Admitting: Internal Medicine

## 2016-03-19 ENCOUNTER — Telehealth: Payer: Self-pay | Admitting: *Deleted

## 2016-03-19 ENCOUNTER — Encounter: Payer: Self-pay | Admitting: Internal Medicine

## 2016-03-19 ENCOUNTER — Ambulatory Visit (HOSPITAL_BASED_OUTPATIENT_CLINIC_OR_DEPARTMENT_OTHER): Payer: Commercial Managed Care - HMO

## 2016-03-19 VITALS — BP 164/53 | HR 40 | Temp 97.7°F | Resp 17 | Ht 66.0 in | Wt 197.6 lb

## 2016-03-19 DIAGNOSIS — Z5111 Encounter for antineoplastic chemotherapy: Secondary | ICD-10-CM

## 2016-03-19 DIAGNOSIS — C3491 Malignant neoplasm of unspecified part of right bronchus or lung: Secondary | ICD-10-CM | POA: Diagnosis not present

## 2016-03-19 DIAGNOSIS — L299 Pruritus, unspecified: Secondary | ICD-10-CM | POA: Diagnosis not present

## 2016-03-19 DIAGNOSIS — R630 Anorexia: Secondary | ICD-10-CM

## 2016-03-19 DIAGNOSIS — C7801 Secondary malignant neoplasm of right lung: Secondary | ICD-10-CM | POA: Diagnosis not present

## 2016-03-19 DIAGNOSIS — T50905A Adverse effect of unspecified drugs, medicaments and biological substances, initial encounter: Secondary | ICD-10-CM

## 2016-03-19 DIAGNOSIS — L298 Other pruritus: Secondary | ICD-10-CM

## 2016-03-19 HISTORY — DX: Adverse effect of unspecified drugs, medicaments and biological substances, initial encounter: T50.905A

## 2016-03-19 HISTORY — DX: Other pruritus: L29.8

## 2016-03-19 LAB — COMPREHENSIVE METABOLIC PANEL
ALT: 22 U/L (ref 0–55)
ANION GAP: 10 meq/L (ref 3–11)
AST: 23 U/L (ref 5–34)
Albumin: 3.4 g/dL — ABNORMAL LOW (ref 3.5–5.0)
Alkaline Phosphatase: 125 U/L (ref 40–150)
BUN: 9.2 mg/dL (ref 7.0–26.0)
CALCIUM: 9.5 mg/dL (ref 8.4–10.4)
CO2: 30 meq/L — AB (ref 22–29)
Chloride: 101 mEq/L (ref 98–109)
Creatinine: 0.9 mg/dL (ref 0.7–1.3)
Glucose: 193 mg/dl — ABNORMAL HIGH (ref 70–140)
POTASSIUM: 4.8 meq/L (ref 3.5–5.1)
Sodium: 141 mEq/L (ref 136–145)
Total Bilirubin: 0.44 mg/dL (ref 0.20–1.20)
Total Protein: 6.3 g/dL — ABNORMAL LOW (ref 6.4–8.3)

## 2016-03-19 LAB — CBC WITH DIFFERENTIAL/PLATELET
BASO%: 1.1 % (ref 0.0–2.0)
Basophils Absolute: 0.1 10*3/uL (ref 0.0–0.1)
EOS ABS: 0.1 10*3/uL (ref 0.0–0.5)
EOS%: 1.7 % (ref 0.0–7.0)
HEMATOCRIT: 42.6 % (ref 38.4–49.9)
HGB: 13.1 g/dL (ref 13.0–17.1)
LYMPH%: 21.6 % (ref 14.0–49.0)
MCH: 28.1 pg (ref 27.2–33.4)
MCHC: 30.8 g/dL — AB (ref 32.0–36.0)
MCV: 91.4 fL (ref 79.3–98.0)
MONO#: 0.4 10*3/uL (ref 0.1–0.9)
MONO%: 5.1 % (ref 0.0–14.0)
NEUT%: 70.5 % (ref 39.0–75.0)
NEUTROS ABS: 5.7 10*3/uL (ref 1.5–6.5)
PLATELETS: 164 10*3/uL (ref 140–400)
RBC: 4.66 10*6/uL (ref 4.20–5.82)
RDW: 16.4 % — ABNORMAL HIGH (ref 11.0–14.6)
WBC: 8 10*3/uL (ref 4.0–10.3)
lymph#: 1.7 10*3/uL (ref 0.9–3.3)
nRBC: 0 % (ref 0–0)

## 2016-03-19 LAB — TECHNOLOGIST REVIEW

## 2016-03-19 MED ORDER — METHYLPREDNISOLONE 4 MG PO TBPK: ORAL_TABLET | ORAL | Status: DC

## 2016-03-19 MED ORDER — SODIUM CHLORIDE 0.9 % IV SOLN
Freq: Once | INTRAVENOUS | Status: AC
Start: 2016-03-19 — End: 2016-03-19
  Administered 2016-03-19: 12:00:00 via INTRAVENOUS

## 2016-03-19 MED ORDER — SODIUM CHLORIDE 0.9 % IV SOLN
Freq: Once | INTRAVENOUS | Status: AC
  Administered 2016-03-19: 12:00:00 via INTRAVENOUS
  Filled 2016-03-19: qty 4

## 2016-03-19 MED ORDER — PACLITAXEL PROTEIN-BOUND CHEMO INJECTION 100 MG
100.0000 mg/m2 | Freq: Once | INTRAVENOUS | Status: AC
  Administered 2016-03-19: 200 mg via INTRAVENOUS
  Filled 2016-03-19: qty 40

## 2016-03-19 NOTE — Telephone Encounter (Signed)
Per staff message and POF I have scheduled appts. Advised scheduler of appts. JMW  

## 2016-03-19 NOTE — Patient Instructions (Signed)
[facility] Discharge Instructions for Patients Receiving Chemotherapy  Today you received the following chemotherapy agents Abraxane To help prevent nausea and vomiting after your treatment, we encourage you to take your nausea medication as prescribed.   If you develop nausea and vomiting that is not controlled by your nausea medication, call the clinic.   BELOW ARE SYMPTOMS THAT SHOULD BE REPORTED IMMEDIATELY:  *FEVER GREATER THAN 100.5 F  *CHILLS WITH OR WITHOUT FEVER  NAUSEA AND VOMITING THAT IS NOT CONTROLLED WITH YOUR NAUSEA MEDICATION  *UNUSUAL SHORTNESS OF BREATH  *UNUSUAL BRUISING OR BLEEDING  TENDERNESS IN MOUTH AND THROAT WITH OR WITHOUT PRESENCE OF ULCERS  *URINARY PROBLEMS  *BOWEL PROBLEMS  UNUSUAL RASH Items with * indicate a potential emergency and should be followed up as soon as possible.  Feel free to call the clinic you have any questions or concerns. The clinic phone number is [phone].  Please show the CHEMO ALERT CARD at check-in to the Emergency Department and triage nurse.   

## 2016-03-19 NOTE — Telephone Encounter (Signed)
left msg for added lab 5/9

## 2016-03-19 NOTE — Progress Notes (Signed)
[place] Telephone:([phone]) [phone]   Fax:(336) [phone]  OFFICE PROGRESS NOTE  [name], MD [address] [place] [address]  DIAGNOSIS: Metastatic non-small cell lung cancer, poorly differentiated carcinoma initially diagnosed as Stage IIIA (T3, N2, M0) non-small cell lung cancer consistent with squamous cell carcinoma involving the left suprahilar mass with mediastinal lymphadenopathy diagnosed in November of 2014. The patient has recurrence in February 2017. PDL 1 expression 0%.  PRIOR THERAPY:  1) Concurrent chemoradiation with weekly carboplatin for AUC of 2 and paclitaxel 45 mg/M2, status post 7 cycles, last dose was given 12/20/2013. First dose on 11/01/2013. 2) Consolidation chemotherapy with carboplatin for AUC of 5 and paclitaxel 175 mg/M2 every 3 weeks with Neulasta support. First cycle on 02/08/2014. Status post 3 cycles and carboplatin was discontinued secondary to allergic reaction.    CURRENT THERAPY: Abraxane 100 MG/M2 on days 1 and 8 every 3 weeks  CHEMOTHERAPY INTENT: Curative/control  CURRENT # OF CHEMOTHERAPY CYCLES: 2  CURRENT ANTIEMETICS: Zofran, dexamethasone and Compazine  CURRENT SMOKING STATUS: Former smoker  ORAL CHEMOTHERAPY AND CONSENT: None  CURRENT BISPHOSPHONATES USE: None  PAIN MANAGEMENT: 0/10  NARCOTICS INDUCED CONSTIPATION: None.  LIVING WILL AND CODE STATUS: Full code.   INTERVAL HISTORY: Ernest [name] 60 y.o. male returns to the clinic today for follow-up visit. The patient is feeling fine today with no specific complaints except for shortness of breath with exertion secondary to COPD. He was found recently to have evidence for disease recurrence in the right lung presenting with right lower lobe lung nodule in addition to mediastinal lymphadenopathy. The patient is currently undergoing systemic chemotherapy with single agent Abraxane. Status post one cycle. He also has some itching and  mild skin rash. He denied having any significant chest pain or hemoptysis. He denied having any nausea or vomiting, no fever or chills. He lost a few pounds since his last visit. He is here today to start cycle #2 of his treatment.  MEDICAL HISTORY: Past Medical History  Diagnosis Date  . CAD (coronary artery disease)     Left Main 30% stenosis, LAD 20 - 30 % stenosis, first and second diagonal branchesat 40 - 50%  stenosis with small arteries, circumflex had 30% stenosis in the large obtuse marginal, RCA at 70 - 80%  stenosis [not felt to be occlusive after evaluation with flow wire], distal 50 - 60% stenosis - [name][  . COPD (chronic obstructive pulmonary disease) (HCC)     Dr. Baird Lyons  . Depression   . Anxiety   . Hyperlipidemia   . Chronic insomnia   . Gout   . GERD (gastroesophageal reflux disease)   . PVD (peripheral vascular disease) (HCC)     PTA/Stent right common iliac  . DDD (degenerative disc disease)   . Hx of colonoscopy   . COPD with asthma ([place]) 09/08/2007  . OSA (obstructive sleep apnea)     NPSG 09/10/10- AHI 11.3/hr  . Hypertension     dr Percival Spanish  . History of radiation therapy 11/10/13- 12/29/13    left lung 6600 cGy in 33 sessions  . Diabetes mellitus without complication ([place]) 5/63/1497  . Cancer ([place])     lung  . Lung cancer ([place]) 10/04/13    LUL squamous cell lung cancer  . History of cardiovascular stress test     Myoview 7/16:  Diaphragmatic attenuation, no ischemia, EF 56%; Low Risk    ALLERGIES:  is allergic  to carboplatin.  MEDICATIONS:  Current Outpatient Prescriptions  Medication Sig Dispense Refill  . ADVAIR DISKUS 500-50 MCG/DOSE AEPB 1 PUFF THEN RINSE MOUTH, TWICE DAILY MAINTENANCE 60 each 2  . albuterol (PROVENTIL) (2.5 MG/3ML) 0.083% nebulizer solution INHALE 1 VIAL IN NEBULIZER EVERY 4 HOURS AS NEEDED FOR WHEEZING OR SHORTNESS OF BREATH 300 mL 12  . albuterol (VENTOLIN HFA) 108 (90 Base) MCG/ACT inhaler INHALE 2 PUFFS INTO  THE LUNGS 4 TIMES DAILY AS NEEDED FOR WHEEZING 18 Inhaler 11  . arformoterol (BROVANA) 15 MCG/2ML NEBU Take 2 mLs (15 mcg total) by nebulization 2 (two) times daily. 8 mL 0  . ARIPiprazole (ABILIFY) 5 MG tablet Take 1 tablet (5 mg total) by mouth at bedtime.    . Armodafinil 250 MG tablet Take 250 mg by mouth every morning.  5  . aspirin 81 MG EC tablet TAKE 1 TABLET BY MOUTH EVERY DAY 30 tablet 5  . atorvastatin (LIPITOR) 20 MG tablet TAKE 1 TABLET EVERY DAY 90 tablet 3  . BELSOMRA 20 MG TABS Take 1 tablet by mouth at bedtime. Take 1 tab by mouth daily at bed time for sleep prn  4  . clonazePAM (KLONOPIN) 1 MG tablet Take 2 mg by mouth at bedtime as needed for anxiety. Reported on 01/16/2016    . clopidogrel (PLAVIX) 75 MG tablet Take 1 tablet (75 mg total) by mouth daily. 90 tablet 3  . dextromethorphan-guaiFENesin (MUCINEX DM) 30-600 MG 12hr tablet Take 1 tablet by mouth 2 (two) times daily as needed for cough (congestion).    [name] esomeprazole (NEXIUM) 40 MG capsule Take 40 mg by mouth every morning.     [name] FLUoxetine (PROZAC) 40 MG capsule Take 40 mg by mouth every morning.    . gabapentin (NEURONTIN) 100 MG capsule Take 100 mg by mouth daily.     . isosorbide mononitrate (IMDUR) 60 MG 24 hr tablet Take 60 mg by mouth daily.    . methocarbamol (ROBAXIN) 500 MG tablet Take 500 mg by mouth 2 (two) times daily as needed for muscle spasms. Reported on 01/16/2016    . metoprolol succinate (TOPROL-XL) 25 MG 24 hr tablet TAKE 1/2 TABLET BY MOUTH DAILY 30 tablet 3  . mirtazapine (REMERON) 45 MG tablet Take 1 tablet (45 mg total) by mouth at bedtime.    [name] morphine (MS CONTIN) 30 MG 12 hr tablet Take 1 tablet (30 mg total) by mouth every 12 (twelve) hours. 60 tablet 0  . morphine (MSIR) 15 MG tablet Take 1 tablet (15 mg total) by mouth every 4 (four) hours as needed for severe pain. 30 tablet 0  . nitroGLYCERIN (NITROSTAT) 0.4 MG SL tablet Place 1 tablet (0.4 mg total) under the tongue every 5 (five) minutes  as needed for chest pain. 25 tablet 3  . ondansetron (ZOFRAN) 8 MG tablet Take 1 tablet (8 mg total) by mouth every 8 (eight) hours as needed for nausea or vomiting. 20 tablet 0  . OXYGEN Inhale 2 L into the lungs at bedtime as needed.    . potassium chloride SA (K-DUR,KLOR-CON) 20 MEQ tablet Take 1 tablet (20 mEq total) by mouth every morning. Reported on 12/07/2015 90 tablet 1  . predniSONE (DELTASONE) 5 MG tablet Take 5 mg by mouth daily.    . prochlorperazine (COMPAZINE) 10 MG tablet Take 1 tablet (10 mg total) by mouth every 6 (six) hours as needed for nausea or vomiting. 30 tablet 0  . roflumilast (DALIRESP) 500 MCG TABS tablet  Take 1 tablet (500 mcg total) by mouth daily. 30 tablet 11  . tamsulosin (FLOMAX) 0.4 MG CAPS capsule Take 1 capsule by mouth daily.  11  . theophylline (UNIPHYL) 400 MG 24 hr tablet Take 400 mg by mouth daily.  5   No current facility-administered medications for this visit.   Facility-Administered Medications Ordered in Other Visits  Medication Dose Route Frequency Provider Last Rate Last Dose  . DOBUTamine (DOBUTREX) 1,000 mcg/mL in dextrose 5% 250 mL infusion  0-40 mcg/kg/min Intravenous Continuous Fay Records, MD 224.2 mL/hr at 06/12/15 1117 40 mcg/kg/min at 06/12/15 1117    SURGICAL HISTORY:  Past Surgical History  Procedure Laterality Date  . Spinal fusion  03/05/2007    L4-L5  . Hip surgery      left 'bone graft taken"  . Arm surgery      left elbow  . Shoulder surgery      right  . C-spine surgery    . Angioplasty    . Video bronchoscopy with endobronchial navigation N/A 10/04/2013    Procedure: VIDEO BRONCHOSCOPY WITH ENDOBRONCHIAL NAVIGATION;  Surgeon: Collene Gobble, MD;  Location: [facility];  Service: Thoracic;  Laterality: N/A;  . Back surgery      lower  . Anterior fusion cervical spine      cervical fusion  7 yrs ago ([facility])  . Colon surgery      '11 "Diverticulitis"  . Colonoscopy with propofol N/A 05/22/2015    Procedure: COLONOSCOPY  WITH PROPOFOL;  Surgeon: Inda Castle, MD;  Location: [facility];  Service: Endoscopy;  Laterality: N/A;  . Peripheral vascular catheterization N/A 08/10/2015    Procedure: Lower Extremity Angiography;  Surgeon: Lorretta Harp, MD; Distal Ao OK, L-EIA stent OK, R-CIA 100% s/p overlapping 7 mm x 38 mm ICast stents, 50-60% R-CFA        REVIEW OF SYSTEMS:  A comprehensive review of systems was negative except for: Constitutional: positive for fatigue and weight loss Respiratory: positive for cough, dyspnea on exertion, sputum and wheezing Integument/breast: positive for pruritus and rash   PHYSICAL EXAMINATION: General appearance: alert, cooperative and no distress Head: Normocephalic, without obvious abnormality, atraumatic Neck: no adenopathy, no JVD, supple, symmetrical, trachea midline and thyroid not enlarged, symmetric, no tenderness/mass/nodules Lymph nodes: Cervical, supraclavicular, and axillary nodes normal. Resp: wheezes bilaterally Back: symmetric, no curvature. ROM normal. No CVA tenderness. Cardio: regular rate and rhythm, S1, S2 normal, no murmur, click, rub or gallop GI: soft, non-tender; bowel sounds normal; no masses,  no organomegaly Extremities: extremities normal, atraumatic, no cyanosis or edema Neurologic: Alert and oriented X 3, normal strength and tone. Normal symmetric reflexes. Normal coordination and gait  ECOG PERFORMANCE STATUS: 1 - Symptomatic but completely ambulatory  Blood pressure 164/53, pulse 40, temperature 97.7 F (36.5 C), temperature source Oral, resp. rate 17, height '5\' 6"'$  (1.676 m), weight 197 lb 9.6 oz (89.631 kg), SpO2 94 %.  LABORATORY DATA: Lab Results  Component Value Date   WBC 8.0 03/19/2016   HGB 13.1 03/19/2016   HCT 42.6 03/19/2016   MCV 91.4 03/19/2016   PLT 164 03/19/2016      Chemistry      Component Value Date/Time   NA 141 03/19/2016 1021   NA 139 01/18/2016 0236   K 4.8 03/19/2016 1021   K 4.3 01/18/2016 0236   CL  98* 01/18/2016 0236   CO2 30* 03/19/2016 1021   CO2 26 01/18/2016 0236   BUN 9.2 03/19/2016 1021  BUN 9 01/18/2016 0236   CREATININE 0.9 03/19/2016 1021   CREATININE 1.07 01/18/2016 0236   CREATININE 0.80 08/07/2015 0959      Component Value Date/Time   CALCIUM 9.5 03/19/2016 1021   CALCIUM 9.1 01/18/2016 0236   ALKPHOS 125 03/19/2016 1021   ALKPHOS 113 01/18/2016 0236   AST 23 03/19/2016 1021   AST 26 01/18/2016 0236   ALT 22 03/19/2016 1021   ALT 24 01/18/2016 0236   BILITOT 0.44 03/19/2016 1021   BILITOT 0.3 01/18/2016 0236       RADIOGRAPHIC STUDIES: No results found.  ASSESSMENT AND PLAN: This is a very pleasant 60 years old white male with stage IIIA non-small cell lung cancer, squamous cell carcinoma currently undergoing concurrent chemoradiation with weekly carboplatin and paclitaxel status post 7 weeks of treatment. He tolerated his treatment fairly well with no significant adverse effects. This was followed by consolidation chemotherapy with carboplatin and paclitaxel status post 3 cycles, .carboplatin was discontinued after cycle #2 secondary to hypersensitivity reaction. The recent CT scan of the chest progressive enlargement of the right lower lobe lung nodule concerning for pulmonary metastasis or new synchronous tumor. The recent PET scan showed hypermetabolic activity in the 1.9 cm right lower lobe pulmonary nodule suspicious for metastasis. There was also small mediastinal node metastasis. CT-guided core biopsy of the right lower lobe pulmonary nodule showed poorly differentiated carcinoma. PDL 1 expression was 0% The patient is currently undergoing systemic chemotherapy with single agent Abraxane on days 1 and 8 every 3 weeks. He is status post 1 cycle. He tolerated the first cycle well except for weight loss and itching. I recommended for the patient to proceed with cycle #2 today as a scheduled. For the lack of appetite and itching, I will start the patient on  Medrol Dosepak. He would come back for follow-up visit in 3 weeks for evaluation before starting cycle #3. He was advised to call immediately if he has any concerning symptoms in the interval. The patient voices understanding of current disease status and treatment options and is in agreement with the current care plan.  All questions were answered. The patient knows to call the clinic with any problems, questions or concerns. We can certainly see the patient much sooner if necessary.  Disclaimer: This note was dictated with voice recognition software. Similar sounding words can inadvertently be transcribed and may not be corrected upon review.

## 2016-03-19 NOTE — Telephone Encounter (Signed)
Pt will p/u sched in tx room

## 2016-03-24 DIAGNOSIS — C342 Malignant neoplasm of middle lobe, bronchus or lung: Secondary | ICD-10-CM | POA: Diagnosis not present

## 2016-03-24 DIAGNOSIS — J9601 Acute respiratory failure with hypoxia: Secondary | ICD-10-CM | POA: Diagnosis not present

## 2016-03-24 DIAGNOSIS — J449 Chronic obstructive pulmonary disease, unspecified: Secondary | ICD-10-CM | POA: Diagnosis not present

## 2016-03-26 ENCOUNTER — Other Ambulatory Visit: Payer: Self-pay | Admitting: Internal Medicine

## 2016-03-26 ENCOUNTER — Ambulatory Visit (HOSPITAL_BASED_OUTPATIENT_CLINIC_OR_DEPARTMENT_OTHER): Payer: Commercial Managed Care - HMO

## 2016-03-26 ENCOUNTER — Other Ambulatory Visit (HOSPITAL_BASED_OUTPATIENT_CLINIC_OR_DEPARTMENT_OTHER): Payer: Commercial Managed Care - HMO

## 2016-03-26 ENCOUNTER — Encounter: Payer: Self-pay | Admitting: Internal Medicine

## 2016-03-26 VITALS — BP 141/66 | HR 64 | Temp 98.0°F | Resp 18

## 2016-03-26 DIAGNOSIS — C3412 Malignant neoplasm of upper lobe, left bronchus or lung: Secondary | ICD-10-CM

## 2016-03-26 DIAGNOSIS — R112 Nausea with vomiting, unspecified: Secondary | ICD-10-CM | POA: Insufficient documentation

## 2016-03-26 DIAGNOSIS — C3491 Malignant neoplasm of unspecified part of right bronchus or lung: Secondary | ICD-10-CM

## 2016-03-26 DIAGNOSIS — Z5111 Encounter for antineoplastic chemotherapy: Secondary | ICD-10-CM

## 2016-03-26 HISTORY — DX: Nausea with vomiting, unspecified: R11.2

## 2016-03-26 LAB — CBC WITH DIFFERENTIAL/PLATELET
BASO%: 1 % (ref 0.0–2.0)
Basophils Absolute: 0.1 10*3/uL (ref 0.0–0.1)
EOS%: 2.9 % (ref 0.0–7.0)
Eosinophils Absolute: 0.2 10*3/uL (ref 0.0–0.5)
HCT: 38.7 % (ref 38.4–49.9)
HEMOGLOBIN: 12.3 g/dL — AB (ref 13.0–17.1)
LYMPH#: 0.6 10*3/uL — AB (ref 0.9–3.3)
LYMPH%: 8.5 % — AB (ref 14.0–49.0)
MCH: 27.9 pg (ref 27.2–33.4)
MCHC: 31.8 g/dL — ABNORMAL LOW (ref 32.0–36.0)
MCV: 87.6 fL (ref 79.3–98.0)
MONO#: 0.3 10*3/uL (ref 0.1–0.9)
MONO%: 3.5 % (ref 0.0–14.0)
NEUT%: 84.1 % — ABNORMAL HIGH (ref 39.0–75.0)
NEUTROS ABS: 6.4 10*3/uL (ref 1.5–6.5)
PLATELETS: 126 10*3/uL — AB (ref 140–400)
RBC: 4.42 10*6/uL (ref 4.20–5.82)
RDW: 16.1 % — AB (ref 11.0–14.6)
WBC: 7.6 10*3/uL (ref 4.0–10.3)

## 2016-03-26 LAB — COMPREHENSIVE METABOLIC PANEL
ALBUMIN: 3.5 g/dL (ref 3.5–5.0)
ALT: 24 U/L (ref 0–55)
ANION GAP: 9 meq/L (ref 3–11)
AST: 20 U/L (ref 5–34)
Alkaline Phosphatase: 111 U/L (ref 40–150)
BILIRUBIN TOTAL: 0.41 mg/dL (ref 0.20–1.20)
BUN: 8.3 mg/dL (ref 7.0–26.0)
CO2: 30 meq/L — AB (ref 22–29)
CREATININE: 0.8 mg/dL (ref 0.7–1.3)
Calcium: 9 mg/dL (ref 8.4–10.4)
Chloride: 101 mEq/L (ref 98–109)
EGFR: >90 mL/min/{1.73_m2} (ref >90)
GLUCOSE: 168 mg/dL — AB (ref 70–140)
Potassium: 3.5 mEq/L (ref 3.5–5.1)
Sodium: 140 mEq/L (ref 136–145)
TOTAL PROTEIN: 6.2 g/dL — AB (ref 6.4–8.3)

## 2016-03-26 LAB — TECHNOLOGIST REVIEW

## 2016-03-26 MED ORDER — ONDANSETRON HCL 8 MG PO TABS: 8.0000 mg | ORAL_TABLET | Freq: Three times a day (TID) | ORAL | Status: DC | PRN

## 2016-03-26 MED ORDER — SODIUM CHLORIDE 0.9 % IV SOLN
Freq: Once | INTRAVENOUS | Status: AC
  Administered 2016-03-26: 14:00:00 via INTRAVENOUS

## 2016-03-26 MED ORDER — PACLITAXEL PROTEIN-BOUND CHEMO INJECTION 100 MG
100.0000 mg/m2 | Freq: Once | INTRAVENOUS | Status: AC
  Administered 2016-03-26: 200 mg via INTRAVENOUS
  Filled 2016-03-26: qty 40

## 2016-03-26 MED ORDER — SODIUM CHLORIDE 0.9 % IV SOLN
Freq: Once | INTRAVENOUS | Status: AC
  Administered 2016-03-26: 14:00:00 via INTRAVENOUS
  Filled 2016-03-26: qty 4

## 2016-03-26 NOTE — Patient Instructions (Signed)
[facility] Discharge Instructions for Patients Receiving Chemotherapy  Today you received the following chemotherapy agents Abraxane To help prevent nausea and vomiting after your treatment, we encourage you to take your nausea medication as prescribed.   If you develop nausea and vomiting that is not controlled by your nausea medication, call the clinic.   BELOW ARE SYMPTOMS THAT SHOULD BE REPORTED IMMEDIATELY:  *FEVER GREATER THAN 100.5 F  *CHILLS WITH OR WITHOUT FEVER  NAUSEA AND VOMITING THAT IS NOT CONTROLLED WITH YOUR NAUSEA MEDICATION  *UNUSUAL SHORTNESS OF BREATH  *UNUSUAL BRUISING OR BLEEDING  TENDERNESS IN MOUTH AND THROAT WITH OR WITHOUT PRESENCE OF ULCERS  *URINARY PROBLEMS  *BOWEL PROBLEMS  UNUSUAL RASH Items with * indicate a potential emergency and should be followed up as soon as possible.  Feel free to call the clinic you have any questions or concerns. The clinic phone number is [phone].  Please show the CHEMO ALERT CARD at check-in to the Emergency Department and triage nurse.   

## 2016-03-27 ENCOUNTER — Other Ambulatory Visit: Payer: Self-pay | Admitting: Cardiology

## 2016-03-27 NOTE — Telephone Encounter (Signed)
Rx Refill

## 2016-03-28 ENCOUNTER — Other Ambulatory Visit: Payer: Self-pay | Admitting: Internal Medicine

## 2016-03-29 ENCOUNTER — Encounter: Payer: Self-pay | Admitting: *Deleted

## 2016-03-29 ENCOUNTER — Other Ambulatory Visit: Payer: Self-pay | Admitting: *Deleted

## 2016-03-29 NOTE — Patient Outreach (Signed)
[name]) Care Management   03/29/2016  Ernest [name] Ernest [name] [birth year] [id number]  Ernest [name] Ernest [name]) is an 60 y.o. male followed by [facility] for recent transition of care after recent inpatient hospital visit, as well as ongoing self management for COPD.  Ernest [name] has lung cancer and recently started a new round of chemotherapy.  Ernest [name] reports that he has had a "very hard time," with his new chemo drug regimen, stating, "the first round of chemo was a breeze compared to this."  Ernest [name] shares that he has lost > 18 pounds since this chemo round started, and states he "has no appetite, and the nausea and vomiting is terrible.  I can't keep anything down except for soup."  He states that he supplements with high protein drinks as well, but "it's not enough to make a difference."  Ernest [name] has not expressed his concerns with how bad he feels with the new chemo to Ernest [name], stating that he "was trying to give it a good try," but states that he has an appointment scheduled with Ernest [name] soon and plans to talk to him about this.  Ernest [name] appears to feel very bad today.  He is obviously congested in his head and his lungs, and his eyes are teary bilaterally.  Ernest [name] reports "this has been going on for 2 weeks now," and states that Ernest [name] is aware, and put him on extra prednisone in the form of a dose pack, which he has almost completed.  Ernest [name] states that he has made an appointment with his PCP for next week but believes he should be worked in earlier to avoid a hospital visit, and I agree with him.  Ernest [name] has continued taking his SaO2 levels "whenever I can," and reports that since he has had this congestion, his O2 levels have been consistently 83-89%.  Today during our visit, his SaO2 levels range between 81-14%, staying consistently around 82-83%.  Today, we talked about the possibility that Ernest [name] may need to go to the ED for treatment if he does not  improve.  Ernest [name] wishes to avoid an acute hospital visit.  I called his PCP provider and spoke to Ernest [name], and was able to arrange an urgent OV for him for tomorrow morning.  We discussed situations that would require him to call EMS and go to the hospital, and Ernest [name] voiced understanding.  Ernest [name] continues to report a strong family support system that assists him with his care and needs.  He reports that he has been driving himself to and from chemo sessions, and confirms that he feels safe driving himself.  Ernest [name] states that if he feels like he is too sick to drive, his family has a support chain on stand-by and someone else will take him.    Ernest [name] was very appreciative of the earlier appointment we secured today, and stated that he wished to lie down and rest, as he gets very fatigued very easily.  I confirmed with him that he has my direct contact information, should he wish to contact me.   Subjective: "This new chemo is gonna kill me.  My head has been stuffy and congested for 2 weeks and my lungs are congested too.  I have an appointment with my PCP for next week, but I feel like I need to be seen sooner.  I can't even walk to my mailbox anymore to check the mail."  Objective:   BP 122/62 mmHg  Pulse 74  Resp 22  SpO2 82%    Review of Systems  Constitutional: Positive for weight loss and malaise/fatigue.       Pt. Reports weight loss of >18 pounds "since I started this round of chemo."  Pt. Reports feeling general sense of malaise and weakness that he associates with his new chemotherapy drugs  HENT: Positive for congestion.   Eyes: Positive for discharge and redness.       Bilateral eyes appear teary and red  Respiratory: Positive for cough, sputum production, shortness of breath and wheezing.        Patient reports severe generalized congestion in his "head and lungs," stating that this "has been going on for 2 weeks."  Pt. Reports that Ernest [name] placed him on an additional dosing regimen  of prednisone in the form of a dose pack, which "is almost completed."   Cardiovascular: Negative for chest pain, palpitations and leg swelling.  Gastrointestinal: Positive for nausea.  Genitourinary: Negative.   Musculoskeletal: Positive for myalgias and back pain. Negative for falls.  Neurological: Positive for dizziness and weakness.       Pt. reports intermittent dizziness and light headedness, which he attributes to "the new chemo."  Psychiatric/Behavioral: Positive for depression. The patient has insomnia. The patient is not nervous/anxious.     Physical Exam  Constitutional: He is oriented to person, place, and time. He appears well-developed. He appears distressed.  Patient appears acutely ill and states he "feels terrible."  Cardiovascular: Normal rate, regular rhythm, normal heart sounds and intact distal pulses.   Respiratory: No respiratory distress. He has wheezes. He has rales.  Pt. Is on 3 L O2 and has SaO2 level of 81-83% during [facility] home visit; pt. reports this is "not unusual;" stating, "my SaO2 levels have been 84-88% since I started this new chemo."  Upon auscultation, pt. Has wheezes and crackles scattered throughout upper and lower A/P lung fields that partially clears when he coughs.  Congested sounding cough.  GI: Soft. Bowel sounds are normal.  Musculoskeletal: He exhibits no edema.  Neurological: He is alert and oriented to person, place, and time.  Skin: Skin is warm and dry.  Psychiatric: He has a normal mood and affect. His behavior is normal. Judgment and thought content normal.    Encounter Medications:   Outpatient Encounter Prescriptions as of 03/29/2016  Medication Sig Note  . ADVAIR DISKUS 500-50 MCG/DOSE AEPB 1 PUFF THEN RINSE MOUTH, TWICE DAILY MAINTENANCE   . albuterol (PROVENTIL) (2.5 MG/3ML) 0.083% nebulizer solution INHALE 1 VIAL IN NEBULIZER EVERY 4 HOURS AS NEEDED FOR WHEEZING OR SHORTNESS OF BREATH   . albuterol (VENTOLIN HFA) 108 (90 Base) MCG/ACT  inhaler INHALE 2 PUFFS INTO THE LUNGS 4 TIMES DAILY AS NEEDED FOR WHEEZING   . arformoterol (BROVANA) 15 MCG/2ML NEBU Take 2 mLs (15 mcg total) by nebulization 2 (two) times daily.   . ARIPiprazole (ABILIFY) 5 MG tablet Take 1 tablet (5 mg total) by mouth at bedtime.   . Armodafinil 250 MG tablet Take 250 mg by mouth every morning. 03/19/2016: Received from: External Pharmacy  . aspirin 81 MG EC tablet TAKE 1 TABLET BY MOUTH EVERY DAY   . atorvastatin (LIPITOR) 20 MG tablet TAKE 1 TABLET EVERY DAY   . BELSOMRA 20 MG TABS Take 1 tablet by mouth at bedtime. Take 1 tab by mouth daily at bed time for sleep prn   . clonazePAM (KLONOPIN) 1 MG tablet Take 2 mg by mouth at bedtime as needed for  anxiety. Reported on 01/16/2016   . clopidogrel (PLAVIX) 75 MG tablet Take 1 tablet (75 mg total) by mouth daily.   [name] dextromethorphan-guaiFENesin (MUCINEX DM) 30-600 MG 12hr tablet Take 1 tablet by mouth 2 (two) times daily as needed for cough (congestion).   [name] esomeprazole (NEXIUM) 40 MG capsule Take 40 mg by mouth every morning.    [name] FLUoxetine (PROZAC) 40 MG capsule Take 40 mg by mouth every morning.   . gabapentin (NEURONTIN) 100 MG capsule Take 100 mg by mouth daily.    . isosorbide mononitrate (IMDUR) 60 MG 24 hr tablet Take 60 mg by mouth daily.   . methocarbamol (ROBAXIN) 500 MG tablet Take 500 mg by mouth 2 (two) times daily as needed for muscle spasms. Reported on 01/16/2016   . methylPREDNISolone (MEDROL DOSEPAK) 4 MG TBPK tablet Use as instructed   . metoprolol succinate (TOPROL-XL) 25 MG 24 hr tablet TAKE 1/2 TABLET BY MOUTH DAILY   . mirtazapine (REMERON) 45 MG tablet Take 1 tablet (45 mg total) by mouth at bedtime.   [name] morphine (MS CONTIN) 30 MG 12 hr tablet Take 1 tablet (30 mg total) by mouth every 12 (twelve) hours.   [name] morphine (MSIR) 15 MG tablet Take 1 tablet (15 mg total) by mouth every 4 (four) hours as needed for severe pain.   . nitroGLYCERIN (NITROSTAT) 0.4 MG SL tablet Place 1 tablet (0.4  mg total) under the tongue every 5 (five) minutes as needed for chest pain. (Patient not taking: Reported on 03/19/2016)   . ondansetron (ZOFRAN) 8 MG tablet Take 1 tablet (8 mg total) by mouth every 8 (eight) hours as needed for nausea or vomiting.   . ondansetron (ZOFRAN) 8 MG tablet Take 1 tablet (8 mg total) by mouth every 8 (eight) hours as needed for nausea or vomiting.   . OXYGEN Inhale 2 L into the lungs at bedtime as needed. 01/16/2016: On 3L    . potassium chloride SA (K-DUR,KLOR-CON) 20 MEQ tablet Take 1 tablet (20 mEq total) by mouth every morning. Reported on 12/07/2015   . predniSONE (DELTASONE) 5 MG tablet Take 5 mg by mouth daily.   . prochlorperazine (COMPAZINE) 10 MG tablet Take 1 tablet (10 mg total) by mouth every 6 (six) hours as needed for nausea or vomiting.   . roflumilast (DALIRESP) 500 MCG TABS tablet Take 1 tablet (500 mcg total) by mouth daily.   . tamsulosin (FLOMAX) 0.4 MG CAPS capsule Take 1 capsule by mouth daily. 02/20/2016: Received from: External Pharmacy Received Sig: TAKE 1 CAPSULE (0.4 MG TOTAL) BY MOUTH DAILY.  [name] theophylline (UNIPHYL) 400 MG 24 hr tablet Take 400 mg by mouth daily.    Facility-Administered Encounter Medications as of 03/29/2016  Medication  . DOBUTamine (DOBUTREX) 1,000 mcg/mL in dextrose 5% 250 mL infusion    Functional Status:   In your present state of health, do you have any difficulty performing the following activities: 02/06/2016 02/02/2016  Hearing? N N  Vision? N N  Difficulty concentrating or making decisions? N N  Walking or climbing stairs? N Y  Dressing or bathing? N N  Doing errands, shopping? - Scientist, forensic and eating ? - -  Using the Toilet? - -  In the past six months, have you accidently leaked urine? - -  Do you have problems with loss of bowel control? - -  Managing your Medications? - -  Managing your Finances? - -  Housekeeping or managing your Housekeeping? - -  Fall/Depression Screening:    PHQ 2/9  Scores 02/02/2016 12/07/2015 10/23/2015 10/16/2015  PHQ - 2 Score '1 1 1 1     '$ Assessment:  Ernest [name] is very tired and has acute congestion in his head and lungs.  His SaO2 levels are suboptimal on 3 L O2 via [name].  Ernest [name] does not feel well and has lost > 18 pounds of weight since he started his new round of chemo.  Ernest [name] would like to, and needs to be seen by his PCP as soon as possible to avoid an inpatient hospital visit.  Plan:  Ernest [name] will attend the urgent PCP provider appointment that was obtained today, for tomorrow morning. If necessary, Ernest [name] will call EMS for transport to the hospital. Ernest [name] will discuss the effects of his new chemotherapy agent with his oncologist at their next scheduled OV. Ernest [name] will call his providers for any new concerns, issues or problems. Next [place] in-home visit scheduled for 3 weeks from today.   [name], RN, BSN, [facility] Care Management  [phone]

## 2016-03-30 ENCOUNTER — Emergency Department (HOSPITAL_COMMUNITY): Payer: Commercial Managed Care - HMO

## 2016-03-30 ENCOUNTER — Emergency Department (HOSPITAL_COMMUNITY)
Admission: EM | Admit: 2016-03-30 | Discharge: 2016-03-30 | Disposition: A | Discharge status: Home / Self Care | Payer: Commercial Managed Care - HMO | Attending: Emergency Medicine | Admitting: Emergency Medicine

## 2016-03-30 ENCOUNTER — Encounter (HOSPITAL_COMMUNITY): Payer: Self-pay | Admitting: Emergency Medicine

## 2016-03-30 ENCOUNTER — Encounter: Payer: Self-pay | Admitting: Family Medicine

## 2016-03-30 ENCOUNTER — Ambulatory Visit (INDEPENDENT_AMBULATORY_CARE_PROVIDER_SITE_OTHER): Payer: Commercial Managed Care - HMO | Admitting: Family Medicine

## 2016-03-30 VITALS — BP 110/68 | HR 106 | Temp 99.8°F | Ht 66.0 in | Wt 194.0 lb

## 2016-03-30 DIAGNOSIS — Z7951 Long term (current) use of inhaled steroids: Secondary | ICD-10-CM | POA: Insufficient documentation

## 2016-03-30 DIAGNOSIS — Z79891 Long term (current) use of opiate analgesic: Secondary | ICD-10-CM | POA: Insufficient documentation

## 2016-03-30 DIAGNOSIS — E785 Hyperlipidemia, unspecified: Secondary | ICD-10-CM | POA: Insufficient documentation

## 2016-03-30 DIAGNOSIS — Z85118 Personal history of other malignant neoplasm of bronchus and lung: Secondary | ICD-10-CM | POA: Diagnosis not present

## 2016-03-30 DIAGNOSIS — I2510 Atherosclerotic heart disease of native coronary artery without angina pectoris: Secondary | ICD-10-CM | POA: Diagnosis not present

## 2016-03-30 DIAGNOSIS — G4733 Obstructive sleep apnea (adult) (pediatric): Secondary | ICD-10-CM | POA: Insufficient documentation

## 2016-03-30 DIAGNOSIS — Z7902 Long term (current) use of antithrombotics/antiplatelets: Secondary | ICD-10-CM | POA: Insufficient documentation

## 2016-03-30 DIAGNOSIS — Z79899 Other long term (current) drug therapy: Secondary | ICD-10-CM | POA: Diagnosis not present

## 2016-03-30 DIAGNOSIS — J189 Pneumonia, unspecified organism: Secondary | ICD-10-CM | POA: Insufficient documentation

## 2016-03-30 DIAGNOSIS — Z7952 Long term (current) use of systemic steroids: Secondary | ICD-10-CM | POA: Insufficient documentation

## 2016-03-30 DIAGNOSIS — E1151 Type 2 diabetes mellitus with diabetic peripheral angiopathy without gangrene: Secondary | ICD-10-CM | POA: Diagnosis not present

## 2016-03-30 DIAGNOSIS — Z7982 Long term (current) use of aspirin: Secondary | ICD-10-CM | POA: Diagnosis not present

## 2016-03-30 DIAGNOSIS — F329 Major depressive disorder, single episode, unspecified: Secondary | ICD-10-CM | POA: Insufficient documentation

## 2016-03-30 DIAGNOSIS — Z87891 Personal history of nicotine dependence: Secondary | ICD-10-CM | POA: Diagnosis not present

## 2016-03-30 DIAGNOSIS — I10 Essential (primary) hypertension: Secondary | ICD-10-CM | POA: Insufficient documentation

## 2016-03-30 DIAGNOSIS — J449 Chronic obstructive pulmonary disease, unspecified: Secondary | ICD-10-CM | POA: Diagnosis not present

## 2016-03-30 DIAGNOSIS — K219 Gastro-esophageal reflux disease without esophagitis: Secondary | ICD-10-CM | POA: Insufficient documentation

## 2016-03-30 DIAGNOSIS — J9601 Acute respiratory failure with hypoxia: Secondary | ICD-10-CM

## 2016-03-30 DIAGNOSIS — R05 Cough: Secondary | ICD-10-CM | POA: Diagnosis present

## 2016-03-30 DIAGNOSIS — R0602 Shortness of breath: Secondary | ICD-10-CM | POA: Diagnosis not present

## 2016-03-30 LAB — COMPREHENSIVE METABOLIC PANEL
ALBUMIN: 3.5 g/dL (ref 3.5–5.0)
ALT: 19 U/L (ref 17–63)
ANION GAP: 10 (ref 5–15)
AST: 21 U/L (ref 15–41)
Alkaline Phosphatase: 84 U/L (ref 38–126)
BILIRUBIN TOTAL: 1 mg/dL (ref 0.3–1.2)
BUN: 10 mg/dL (ref 6–20)
CO2: 30 mmol/L (ref 22–32)
Calcium: 8.6 mg/dL — ABNORMAL LOW (ref 8.9–10.3)
Chloride: 99 mmol/L — ABNORMAL LOW (ref 101–111)
Creatinine, Ser: 0.82 mg/dL (ref 0.61–1.24)
GFR calc non Af Amer: >60 mL/min (ref >60)
GLUCOSE: 192 mg/dL — AB (ref 65–99)
POTASSIUM: 3.3 mmol/L — AB (ref 3.5–5.1)
Sodium: 139 mmol/L (ref 135–145)
TOTAL PROTEIN: 5.9 g/dL — AB (ref 6.5–8.1)

## 2016-03-30 LAB — CBC WITH DIFFERENTIAL/PLATELET
BASOS ABS: 0 10*3/uL (ref 0.0–0.1)
Basophils Relative: 0 %
Eosinophils Absolute: 0.1 10*3/uL (ref 0.0–0.7)
Eosinophils Relative: 2 %
HEMATOCRIT: 35.8 % — AB (ref 39.0–52.0)
HEMOGLOBIN: 11.4 g/dL — AB (ref 13.0–17.0)
LYMPHS PCT: 5 %
Lymphs Abs: 0.3 10*3/uL — ABNORMAL LOW (ref 0.7–4.0)
MCH: 28.4 pg (ref 26.0–34.0)
MCHC: 31.8 g/dL (ref 30.0–36.0)
MCV: 89.3 fL (ref 78.0–100.0)
MONO ABS: 0.2 10*3/uL (ref 0.1–1.0)
Monocytes Relative: 3 %
NEUTROS ABS: 5.7 10*3/uL (ref 1.7–7.7)
NEUTROS PCT: 90 %
Platelets: 107 10*3/uL — ABNORMAL LOW (ref 150–400)
RBC: 4.01 MIL/uL — AB (ref 4.22–5.81)
RDW: 16.1 % — ABNORMAL HIGH (ref 11.5–15.5)
WBC: 6.3 10*3/uL (ref 4.0–10.5)

## 2016-03-30 LAB — BLOOD GAS, ARTERIAL
ACID-BASE EXCESS: 5.6 mmol/L — AB (ref 0.0–2.0)
Bicarbonate: 31 mEq/L — ABNORMAL HIGH (ref 20.0–24.0)
DRAWN BY: 295031
O2 CONTENT: 3 L/min
O2 SAT: 86.6 %
PATIENT TEMPERATURE: 98.6
TCO2: 28.4 mmol/L (ref 0–100)
pCO2 arterial: 51.6 mmHg — ABNORMAL HIGH (ref 35.0–45.0)
pH, Arterial: 7.397 (ref 7.350–7.450)
pO2, Arterial: 51 mmHg — ABNORMAL LOW (ref 80.0–100.0)

## 2016-03-30 LAB — I-STAT CG4 LACTIC ACID, ED: Lactic Acid, Venous: 1.44 mmol/L (ref 0.5–2.0)

## 2016-03-30 MED ORDER — POTASSIUM CHLORIDE CRYS ER 20 MEQ PO TBCR
40.0000 meq | EXTENDED_RELEASE_TABLET | Freq: Once | ORAL | Status: AC
  Administered 2016-03-30: 40 meq via ORAL
  Filled 2016-03-30: qty 2

## 2016-03-30 MED ORDER — PREDNISONE 20 MG PO TABS: 40.0000 mg | ORAL_TABLET | Freq: Every day | ORAL | Status: DC

## 2016-03-30 MED ORDER — LEVOFLOXACIN IN D5W 750 MG/150ML IV SOLN
750.0000 mg | Freq: Once | INTRAVENOUS | Status: AC
  Administered 2016-03-30: 750 mg via INTRAVENOUS
  Filled 2016-03-30: qty 150

## 2016-03-30 MED ORDER — PREDNISONE 20 MG PO TABS
60.0000 mg | ORAL_TABLET | Freq: Once | ORAL | Status: AC
  Administered 2016-03-30: 60 mg via ORAL
  Filled 2016-03-30: qty 3

## 2016-03-30 MED ORDER — IPRATROPIUM-ALBUTEROL 0.5-2.5 (3) MG/3ML IN SOLN
3.0000 mL | Freq: Once | RESPIRATORY_TRACT | Status: AC
  Administered 2016-03-30: 3 mL via RESPIRATORY_TRACT
  Filled 2016-03-30: qty 3

## 2016-03-30 MED ORDER — LEVOFLOXACIN 750 MG PO TABS: 750.0000 mg | ORAL_TABLET | Freq: Every day | ORAL | Status: DC

## 2016-03-30 NOTE — Progress Notes (Signed)
   Subjective:    Patient ID: Ernest [name], male    DOB: 1956-06-27, 60 y.o.   MRN: [id number]  HPI Here with upper respiratory symptoms that are worsening for a month   Has a hx of copd  On 3L continuous at home  Sees pulmonary   Both head and chest congestion  Green d/c from nose and chest  Facial pain  Not worse on one side   Is up to date on flu shots and pneumonia vaccines   On chemo for lung cancer   Is on prednisone 5 mg   Doing 6 NMT of albuterol daily  They help for 2 hours approx      Review of Systems     Objective:   Physical Exam  Pulmonary/Chest: He has wheezes. He has rales.  Diffuse wheeze with prolonged esp phase and rales at L base   Psychiatric: He has a normal mood and affect.          Assessment & Plan:   Problem List Items Addressed This Visit      Respiratory   Acute respiratory failure with hypoxia ([place]) - Primary    60 yo pt with hx of lung cancer and chemo/immunocopromised - with uri and cough - rales at R base and pulse ox in low 80s on 3L of oxygen  Sent to ED for eval  Wife will drive him

## 2016-03-30 NOTE — ED Notes (Signed)
Per patient, states congestion, productive cough-seen by PCP and sent here for possible PNA

## 2016-03-30 NOTE — Progress Notes (Signed)
Pre visit review using our clinic review tool, if applicable. No additional management support is needed unless otherwise documented below in the visit note. 

## 2016-03-30 NOTE — Assessment & Plan Note (Signed)
60 yo pt with hx of lung cancer and chemo/immunocopromised - with uri and cough - rales at R base and pulse ox in low 80s on 3L of oxygen  Sent to ED for eval  Wife will drive him

## 2016-03-30 NOTE — ED Provider Notes (Signed)
CSN: [id number]     Arrival date & time 03/30/16  1003 History   First MD Initiated Contact with Patient 03/30/16 1027     Chief Complaint  Patient presents with  . Cough     (Consider location/radiation/quality/duration/timing/severity/associated sxs/prior Treatment) HPI  60 year old male with a history of COPD and lung cancer on chronic 3 L O2 presents with cough and shortness of breath. Patient states that he's been having progressive cough, head congestion and chest congestion for 3 weeks. Cough is productive. No fevers. Has also become more short of breath during this time. Denies any chest or back pain. No leg swelling. Went to his PCPs and was sent here for concern for pneumonia. Patient is currently on chemotherapy. Oncologist is [name].  Past Medical History  Diagnosis Date  . CAD (coronary artery disease)     Left Main 30% stenosis, LAD 20 - 30 % stenosis, first and second diagonal branchesat 40 - 50%  stenosis with small arteries, circumflex had 30% stenosis in the large obtuse marginal, RCA at 70 - 80%  stenosis [not felt to be occlusive after evaluation with flow wire], distal 50 - 60% stenosis - [name][  . COPD (chronic obstructive pulmonary disease) (HCC)     Dr. Baird Lyons  . Depression   . Anxiety   . Hyperlipidemia   . Chronic insomnia   . Gout   . GERD (gastroesophageal reflux disease)   . PVD (peripheral vascular disease) (HCC)     PTA/Stent right common iliac  . DDD (degenerative disc disease)   . Hx of colonoscopy   . COPD with asthma ([place]) 09/08/2007  . OSA (obstructive sleep apnea)     NPSG 09/10/10- AHI 11.3/hr  . Hypertension     dr Percival Spanish  . History of radiation therapy 11/10/13- 12/29/13    left lung 6600 cGy in 33 sessions  . Diabetes mellitus without complication ([place]) 8/78/6767  . Cancer ([place])     lung  . Lung cancer ([place]) 10/04/13    LUL squamous cell lung cancer  . History of cardiovascular stress test     Myoview 7/16:   Diaphragmatic attenuation, no ischemia, EF 56%; Low Risk  . Itching due to drug 03/19/2016  . Nausea with vomiting 03/26/2016   Past Surgical History  Procedure Laterality Date  . Spinal fusion  03/05/2007    L4-L5  . Hip surgery      left 'bone graft taken"  . Arm surgery      left elbow  . Shoulder surgery      right  . C-spine surgery    . Angioplasty    . Video bronchoscopy with endobronchial navigation N/A 10/04/2013    Procedure: VIDEO BRONCHOSCOPY WITH ENDOBRONCHIAL NAVIGATION;  Surgeon: Collene Gobble, MD;  Location: [facility];  Service: Thoracic;  Laterality: N/A;  . Back surgery      lower  . Anterior fusion cervical spine      cervical fusion  7 yrs ago ([facility])  . Colon surgery      '11 "Diverticulitis"  . Colonoscopy with propofol N/A 05/22/2015    Procedure: COLONOSCOPY WITH PROPOFOL;  Surgeon: Inda Castle, MD;  Location: [facility];  Service: Endoscopy;  Laterality: N/A;  . Peripheral vascular catheterization N/A 08/10/2015    Procedure: Lower Extremity Angiography;  Surgeon: Lorretta Harp, MD; Distal Ao OK, L-EIA stent OK, R-CIA 100% s/p overlapping 7 mm x 38 mm ICast stents, 50-60% R-CFA  Family History  Problem Relation Age of Onset  . Heart attack Mother   . Heart attack Sister   . Lung cancer Sister   . Cancer Sister     small cell lung, mets to brain  . Emphysema Sister   . Hypertension Brother   . Stroke Neg Hx    Social History  Substance Use Topics  . Smoking status: Former Smoker -- 0.50 packs/day for 43 years    Types: Cigarettes    Quit date: 09/23/2013  . Smokeless tobacco: Never Used     Comment: history of 3 PPD, 11/02/13   . Alcohol Use: No    Review of Systems  Constitutional: Negative for fever.  HENT: Positive for congestion.   Respiratory: Positive for cough and shortness of breath.   Cardiovascular: Negative for chest pain and leg swelling.  All other systems reviewed and are negative.     Allergies  Carboplatin  Home  Medications   Prior to Admission medications   Medication Sig Start Date End Date Taking? Authorizing Provider  ADVAIR DISKUS 500-50 MCG/DOSE AEPB 1 PUFF THEN RINSE MOUTH, TWICE DAILY MAINTENANCE 02/01/16   Deneise Lever, MD  albuterol (PROVENTIL) (2.5 MG/3ML) 0.083% nebulizer solution INHALE 1 VIAL IN NEBULIZER EVERY 4 HOURS AS NEEDED FOR WHEEZING OR SHORTNESS OF BREATH 02/14/16   Deneise Lever, MD  albuterol (VENTOLIN HFA) 108 (90 Base) MCG/ACT inhaler INHALE 2 PUFFS INTO THE LUNGS 4 TIMES DAILY AS NEEDED FOR WHEEZING 02/14/16   Deneise Lever, MD  arformoterol (BROVANA) 15 MCG/2ML NEBU Take 2 mLs (15 mcg total) by nebulization 2 (two) times daily. 09/22/15   Deneise Lever, MD  ARIPiprazole (ABILIFY) 5 MG tablet Take 1 tablet (5 mg total) by mouth at bedtime. 03/30/13   Doe-Hyun R Shawna Orleans, DO  Armodafinil 250 MG tablet Take 250 mg by mouth every morning. 03/08/16   Historical Provider, MD  aspirin 81 MG EC tablet TAKE 1 TABLET BY MOUTH EVERY DAY 01/04/16   Janith Lima, MD  atorvastatin (LIPITOR) 20 MG tablet TAKE 1 TABLET EVERY DAY 08/01/15   Dorena Cookey, MD  BELSOMRA 20 MG TABS Take 1 tablet by mouth at bedtime. Reported on 03/29/2016 12/26/15   Historical Provider, MD  clonazePAM (KLONOPIN) 1 MG tablet Take 2 mg by mouth at bedtime as needed for anxiety. Reported on 01/16/2016    Historical Provider, MD  clopidogrel (PLAVIX) 75 MG tablet Take 1 tablet (75 mg total) by mouth daily. 08/11/15   Almyra Deforest, PA  dextromethorphan-guaiFENesin [facility] DM) 30-600 MG 12hr tablet Take 1 tablet by mouth 2 (two) times daily as needed for cough (congestion).    Historical Provider, MD  esomeprazole (NEXIUM) 40 MG capsule Take 40 mg by mouth every morning.  06/20/14   Doe-Hyun R Shawna Orleans, DO  FLUoxetine (PROZAC) 40 MG capsule Take 40 mg by mouth every morning.    Historical Provider, MD  gabapentin (NEURONTIN) 100 MG capsule Take 100 mg by mouth daily.  07/15/15   Historical Provider, MD  isosorbide mononitrate (IMDUR) 60  MG 24 hr tablet Take 60 mg by mouth daily. 12/06/15   Historical Provider, MD  methocarbamol (ROBAXIN) 500 MG tablet Take 500 mg by mouth 2 (two) times daily as needed for muscle spasms. Reported on 01/16/2016 10/27/13   Historical Provider, MD  methylPREDNISolone (MEDROL DOSEPAK) 4 MG TBPK tablet Use as instructed 03/19/16   Curt Bears, MD  metoprolol succinate (TOPROL-XL) 25 MG 24 hr tablet TAKE 1/2  TABLET BY MOUTH DAILY 03/27/16   Minus Breeding, MD  mirtazapine (REMERON) 45 MG tablet Take 1 tablet (45 mg total) by mouth at bedtime. 12/29/14   Tammy S Parrett, NP  morphine (MS CONTIN) 30 MG 12 hr tablet Take 1 tablet (30 mg total) by mouth every 12 (twelve) hours. 01/08/16   Janith Lima, MD  morphine (MSIR) 15 MG tablet Take 1 tablet (15 mg total) by mouth every 4 (four) hours as needed for severe pain. 01/08/16   Janith Lima, MD  nitroGLYCERIN (NITROSTAT) 0.4 MG SL tablet Place 1 tablet (0.4 mg total) under the tongue every 5 (five) minutes as needed for chest pain. 08/09/14   Minus Breeding, MD  ondansetron (ZOFRAN) 8 MG tablet Take 1 tablet (8 mg total) by mouth every 8 (eight) hours as needed for nausea or vomiting. 03/26/16   Curt Bears, MD  OXYGEN Inhale 2 L into the lungs at bedtime as needed.    Historical Provider, MD  potassium chloride SA (K-DUR,KLOR-CON) 20 MEQ tablet Take 1 tablet (20 mEq total) by mouth every morning. Reported on 12/07/2015 12/12/15   Janith Lima, MD  predniSONE (DELTASONE) 5 MG tablet Take 5 mg by mouth daily. 01/12/16   Historical Provider, MD  prochlorperazine (COMPAZINE) 10 MG tablet Take 1 tablet (10 mg total) by mouth every 6 (six) hours as needed for nausea or vomiting. 02/20/16   Curt Bears, MD  roflumilast (DALIRESP) 500 MCG TABS tablet Take 1 tablet (500 mcg total) by mouth daily. 01/24/16   Janith Lima, MD  tamsulosin (FLOMAX) 0.4 MG CAPS capsule Take 1 capsule by mouth daily. 02/05/16   Historical Provider, MD  theophylline (UNIPHYL) 400 MG 24 hr  tablet Take 400 mg by mouth daily. 11/13/15   Historical Provider, MD   BP 155/71 mmHg  Pulse 99  Temp(Src) 98 F (36.7 C) (Oral)  Resp 22  SpO2 93% Physical Exam  Constitutional: He is oriented to person, place, and time. He appears well-developed and well-nourished.  HENT:  Head: Normocephalic and atraumatic.  Right Ear: External ear normal.  Left Ear: External ear normal.  Nose: Nose normal.  Eyes: Right eye exhibits no discharge. Left eye exhibits no discharge.  Neck: Neck supple.  Cardiovascular: Normal rate, regular rhythm, normal heart sounds and intact distal pulses.   Pulmonary/Chest: Effort normal. No accessory muscle usage. No respiratory distress. He has decreased breath sounds in the left lower field. He has wheezes in the right upper field, the right middle field, the right lower field, the left upper field and the left middle field.  Abdominal: Soft. There is no tenderness.  Musculoskeletal: He exhibits no edema.  Neurological: He is alert and oriented to person, place, and time.  Skin: Skin is warm and dry.  Nursing note and vitals reviewed.   ED Course  Procedures (including critical care time) Labs Review Labs Reviewed  COMPREHENSIVE METABOLIC PANEL - Abnormal; Notable for the following:    Potassium 3.3 (*)    Chloride 99 (*)    Glucose, Bld 192 (*)    Calcium 8.6 (*)    Total Protein 5.9 (*)    All other components within normal limits  CBC WITH DIFFERENTIAL/PLATELET - Abnormal; Notable for the following:    RBC 4.01 (*)    Hemoglobin 11.4 (*)    HCT 35.8 (*)    RDW 16.1 (*)    Platelets 107 (*)    Lymphs Abs 0.3 (*)    All  other components within normal limits  BLOOD GAS, ARTERIAL - Abnormal; Notable for the following:    pCO2 arterial 51.6 (*)    pO2, Arterial 51.0 (*)    Bicarbonate 31.0 (*)    Acid-Base Excess 5.6 (*)    All other components within normal limits  CULTURE, BLOOD (ROUTINE X 2)  CULTURE, BLOOD (ROUTINE X 2)  I-STAT CG4 LACTIC  ACID, ED    Imaging Review Dg Chest 2 View  03/30/2016  CLINICAL DATA:  Patient with worsening shortness of breath. EXAM: CHEST  2 VIEW COMPARISON:  Chest radiograph 02/06/2016 FINDINGS: Anterior cervical spinal fusion hardware. Stable enlarged cardiac and mediastinal contours. Interval development of consolidation within the right mid and lower lung. Small right pleural effusion. Re- demonstrated left apical consolidation. Mid thoracic spine degenerative changes. Right lower lobe nodule appears have increased in size as well, demonstrated best on lateral view. IMPRESSION: Interval development of consolidation within the right lower lobe concerning for pneumonia in the appropriate clinical setting. There is an associated new small right pleural effusion. Unchanged left apical pulmonary opacity. Interval increase in size of right lower lobe nodule. Electronically Signed   By: Lovey Newcomer M.D.   On: 03/30/2016 10:34   I have personally reviewed and evaluated these images and lab results as part of my medical decision-making.   EKG Interpretation None      MDM   Final diagnoses:  Healthcare-associated pneumonia    Patient's sats are stable on his chronic 3 L of oxygen. Patient has no significant increased work of breathing. He feels better after a DuoNeb. Given his significant wheezing, will give prednisone and discharged on prednisone. He would very much like to go home and given his well appearance this is reasonable. Discussed with his oncologist, [name], who is comfortable with him going home and will follow him up in 3 days as scheduled. Patient will be given Levaquin here and at discharge. Discussed strict return precautions. Given oral potassium for mild hypokalemia. Does not appear significantly ill or septic.    Sherwood Gambler, MD 03/30/16 1302

## 2016-03-30 NOTE — Patient Instructions (Signed)
Please head over to [place] I am worried about your lack of oxygen in the setting of infection and you may have pneumonia that requires urgent evaluation and more aggressive treatment than we can provide

## 2016-04-01 ENCOUNTER — Ambulatory Visit: Payer: Commercial Managed Care - HMO | Admitting: Internal Medicine

## 2016-04-01 DIAGNOSIS — Z0289 Encounter for other administrative examinations: Secondary | ICD-10-CM

## 2016-04-02 ENCOUNTER — Other Ambulatory Visit: Payer: Commercial Managed Care - HMO

## 2016-04-02 ENCOUNTER — Other Ambulatory Visit (HOSPITAL_BASED_OUTPATIENT_CLINIC_OR_DEPARTMENT_OTHER): Payer: Commercial Managed Care - HMO

## 2016-04-02 DIAGNOSIS — C3491 Malignant neoplasm of unspecified part of right bronchus or lung: Secondary | ICD-10-CM

## 2016-04-02 DIAGNOSIS — J449 Chronic obstructive pulmonary disease, unspecified: Secondary | ICD-10-CM | POA: Diagnosis not present

## 2016-04-02 LAB — CBC WITH DIFFERENTIAL/PLATELET
BASO%: 0.4 % (ref 0.0–2.0)
BASOS ABS: 0 10*3/uL (ref 0.0–0.1)
EOS%: 1 % (ref 0.0–7.0)
Eosinophils Absolute: 0.1 10*3/uL (ref 0.0–0.5)
HCT: 36.8 % — ABNORMAL LOW (ref 38.4–49.9)
HGB: 11.7 g/dL — ABNORMAL LOW (ref 13.0–17.1)
LYMPH%: 7.7 % — ABNORMAL LOW (ref 14.0–49.0)
MCH: 28.1 pg (ref 27.2–33.4)
MCHC: 31.8 g/dL — AB (ref 32.0–36.0)
MCV: 88.5 fL (ref 79.3–98.0)
MONO#: 0.5 10*3/uL (ref 0.1–0.9)
MONO%: 8.9 % (ref 0.0–14.0)
NEUT#: 4.8 10*3/uL (ref 1.5–6.5)
NEUT%: 82 % — AB (ref 39.0–75.0)
Platelets: 144 10*3/uL (ref 140–400)
RBC: 4.16 10*6/uL — AB (ref 4.20–5.82)
RDW: 16.9 % — AB (ref 11.0–14.6)
WBC: 5.9 10*3/uL (ref 4.0–10.3)
lymph#: 0.5 10*3/uL — ABNORMAL LOW (ref 0.9–3.3)

## 2016-04-02 LAB — COMPREHENSIVE METABOLIC PANEL
ALT: 22 U/L (ref 0–55)
AST: 10 U/L (ref 5–34)
Albumin: 3 g/dL — ABNORMAL LOW (ref 3.5–5.0)
Alkaline Phosphatase: 76 U/L (ref 40–150)
Anion Gap: 9 mEq/L (ref 3–11)
BUN: 11.6 mg/dL (ref 7.0–26.0)
CHLORIDE: 99 meq/L (ref 98–109)
CO2: 31 meq/L — AB (ref 22–29)
Calcium: 9.3 mg/dL (ref 8.4–10.4)
Creatinine: 0.8 mg/dL (ref 0.7–1.3)
GLUCOSE: 247 mg/dL — AB (ref 70–140)
POTASSIUM: 4.3 meq/L (ref 3.5–5.1)
SODIUM: 139 meq/L (ref 136–145)
TOTAL PROTEIN: 5.9 g/dL — AB (ref 6.4–8.3)
Total Bilirubin: 0.35 mg/dL (ref 0.20–1.20)

## 2016-04-02 LAB — TECHNOLOGIST REVIEW: Technologist Review: 1

## 2016-04-03 ENCOUNTER — Other Ambulatory Visit: Payer: Self-pay | Admitting: Internal Medicine

## 2016-04-04 LAB — CULTURE, BLOOD (ROUTINE X 2)
CULTURE: NO GROWTH
Culture: NO GROWTH

## 2016-04-05 ENCOUNTER — Telehealth: Payer: Self-pay

## 2016-04-05 ENCOUNTER — Telehealth: Payer: Self-pay | Admitting: Internal Medicine

## 2016-04-05 MED ORDER — PANTOPRAZOLE SODIUM 40 MG PO TBEC: 40.0000 mg | DELAYED_RELEASE_TABLET | Freq: Every day | ORAL | Status: DC

## 2016-04-05 NOTE — Telephone Encounter (Signed)
Ok Done Thx 

## 2016-04-05 NOTE — Telephone Encounter (Signed)
Natalie with [facility] called [phone]) Drug contraindication between Nexium and Plavix.  Pharmacist stated that the Nexium could be switched to Protonix.   Forwarding to PCP and another provider in leu of PCP absence.   Did inform the pharmacist to hold the Nexium until I have an answer PCP or another provider in the office.   Please advise

## 2016-04-05 NOTE — Telephone Encounter (Signed)
Error/ltd ° °

## 2016-04-08 NOTE — Telephone Encounter (Signed)
Contacted pharmacy and gave advise below.

## 2016-04-09 ENCOUNTER — Encounter: Payer: Self-pay | Admitting: Internal Medicine

## 2016-04-09 ENCOUNTER — Other Ambulatory Visit (HOSPITAL_BASED_OUTPATIENT_CLINIC_OR_DEPARTMENT_OTHER): Payer: Commercial Managed Care - HMO

## 2016-04-09 ENCOUNTER — Telehealth: Payer: Self-pay | Admitting: Internal Medicine

## 2016-04-09 ENCOUNTER — Ambulatory Visit (HOSPITAL_BASED_OUTPATIENT_CLINIC_OR_DEPARTMENT_OTHER): Payer: Commercial Managed Care - HMO

## 2016-04-09 ENCOUNTER — Ambulatory Visit (HOSPITAL_BASED_OUTPATIENT_CLINIC_OR_DEPARTMENT_OTHER): Payer: Commercial Managed Care - HMO | Admitting: Internal Medicine

## 2016-04-09 VITALS — BP 113/58 | HR 80 | Temp 98.1°F | Resp 21 | Ht 66.0 in | Wt 197.1 lb

## 2016-04-09 DIAGNOSIS — C3491 Malignant neoplasm of unspecified part of right bronchus or lung: Secondary | ICD-10-CM

## 2016-04-09 DIAGNOSIS — Z5111 Encounter for antineoplastic chemotherapy: Secondary | ICD-10-CM

## 2016-04-09 DIAGNOSIS — C7801 Secondary malignant neoplasm of right lung: Secondary | ICD-10-CM

## 2016-04-09 LAB — COMPREHENSIVE METABOLIC PANEL
ALK PHOS: 87 U/L (ref 40–150)
ALT: 26 U/L (ref 0–55)
AST: 15 U/L (ref 5–34)
Albumin: 3.2 g/dL — ABNORMAL LOW (ref 3.5–5.0)
Anion Gap: 6 mEq/L (ref 3–11)
BUN: 12.8 mg/dL (ref 7.0–26.0)
CO2: 32 meq/L — AB (ref 22–29)
Calcium: 8.9 mg/dL (ref 8.4–10.4)
Chloride: 101 mEq/L (ref 98–109)
Creatinine: 0.8 mg/dL (ref 0.7–1.3)
GLUCOSE: 220 mg/dL — AB (ref 70–140)
POTASSIUM: 4.4 meq/L (ref 3.5–5.1)
SODIUM: 139 meq/L (ref 136–145)
TOTAL PROTEIN: 5.9 g/dL — AB (ref 6.4–8.3)
Total Bilirubin: 0.37 mg/dL (ref 0.20–1.20)

## 2016-04-09 LAB — CBC WITH DIFFERENTIAL/PLATELET
BASO%: 1 % (ref 0.0–2.0)
Basophils Absolute: 0.1 10*3/uL (ref 0.0–0.1)
EOS ABS: 0.2 10*3/uL (ref 0.0–0.5)
EOS%: 1.3 % (ref 0.0–7.0)
HCT: 38.3 % — ABNORMAL LOW (ref 38.4–49.9)
HEMOGLOBIN: 12 g/dL — AB (ref 13.0–17.1)
LYMPH%: 5.4 % — AB (ref 14.0–49.0)
MCH: 28.4 pg (ref 27.2–33.4)
MCHC: 31.4 g/dL — ABNORMAL LOW (ref 32.0–36.0)
MCV: 90.5 fL (ref 79.3–98.0)
MONO#: 1 10*3/uL — ABNORMAL HIGH (ref 0.1–0.9)
MONO%: 7.5 % (ref 0.0–14.0)
NEUT%: 84.8 % — ABNORMAL HIGH (ref 39.0–75.0)
NEUTROS ABS: 11.4 10*3/uL — AB (ref 1.5–6.5)
Platelets: 139 10*3/uL — ABNORMAL LOW (ref 140–400)
RBC: 4.24 10*6/uL (ref 4.20–5.82)
RDW: 18.8 % — AB (ref 11.0–14.6)
WBC: 13.4 10*3/uL — AB (ref 4.0–10.3)
lymph#: 0.7 10*3/uL — ABNORMAL LOW (ref 0.9–3.3)

## 2016-04-09 LAB — TECHNOLOGIST REVIEW

## 2016-04-09 MED ORDER — SODIUM CHLORIDE 0.9 % IV SOLN
Freq: Once | INTRAVENOUS | Status: AC
  Administered 2016-04-09: 12:00:00 via INTRAVENOUS

## 2016-04-09 MED ORDER — SODIUM CHLORIDE 0.9 % IV SOLN
Freq: Once | INTRAVENOUS | Status: AC
  Administered 2016-04-09: 12:00:00 via INTRAVENOUS
  Filled 2016-04-09: qty 4

## 2016-04-09 MED ORDER — PACLITAXEL PROTEIN-BOUND CHEMO INJECTION 100 MG
100.0000 mg/m2 | Freq: Once | INTRAVENOUS | Status: AC
Start: 2016-04-09 — End: 2016-04-09
  Administered 2016-04-09: 200 mg via INTRAVENOUS
  Filled 2016-04-09: qty 40

## 2016-04-09 NOTE — Progress Notes (Signed)
[facility] Telephone:([phone]   [phone]  OFFICE PROGRESS NOTE  [name], MD [address] [facility] [address] [place] [address]  DIAGNOSIS: Metastatic non-small cell lung cancer, poorly differentiated carcinoma initially diagnosed as Stage IIIA (T3, N2, M0) non-small cell lung cancer consistent with squamous cell carcinoma involving the left suprahilar mass with mediastinal lymphadenopathy diagnosed in November of 2014. The patient has recurrence in February 2017. PDL 1 expression 0%.  PRIOR THERAPY:  1) Concurrent chemoradiation with weekly carboplatin for AUC of 2 and paclitaxel 45 mg/M2, status post 7 cycles, last dose was given 12/20/2013. First dose on 11/01/2013. 2) Consolidation chemotherapy with carboplatin for AUC of 5 and paclitaxel 175 mg/M2 every 3 weeks with Neulasta support. First cycle on 02/08/2014. Status post 3 cycles and carboplatin was discontinued secondary to allergic reaction.   CURRENT THERAPY: Abraxane 100 MG/M2 on days 1 and 8 every 3 weeks. Status post 2 cycles.  CHEMOTHERAPY INTENT: Curative/control  CURRENT # OF CHEMOTHERAPY CYCLES: 3  CURRENT ANTIEMETICS: Zofran, dexamethasone and Compazine  CURRENT SMOKING STATUS: Former smoker  ORAL CHEMOTHERAPY AND CONSENT: None  CURRENT BISPHOSPHONATES USE: None  PAIN MANAGEMENT: 0/10  NARCOTICS INDUCED CONSTIPATION: None.  LIVING WILL AND CODE STATUS: Full code.   INTERVAL HISTORY: Ernest [name] 60 y.o. male returns to the clinic today for follow-up visit. The patient is feeling fine today with no specific complaints except for shortness of breath with exertion secondary to COPD. He also has some hoarseness of his voice recently. He was found recently to have evidence for disease recurrence in the right lung presenting with right lower lobe lung nodule in addition to mediastinal lymphadenopathy. The patient is currently undergoing systemic chemotherapy with single  agent Abraxane. Status post 2 cycles. He also has some itching and mild skin rash. He denied having any significant chest pain or hemoptysis. He denied having any nausea or vomiting, no fever or chills. He is here today to start cycle #3 of his treatment.  MEDICAL HISTORY: Past Medical History  Diagnosis Date  . CAD (coronary artery disease)     Left Main 30% stenosis, LAD 20 - 30 % stenosis, first and second diagonal branchesat 40 - 50%  stenosis with small arteries, circumflex had 30% stenosis in the large obtuse marginal, RCA at 70 - 80%  stenosis [not felt to be occlusive after evaluation with flow wire], distal 50 - 60% stenosis - [name][  . COPD (chronic obstructive pulmonary disease) (HCC)     Dr. Baird Lyons  . Depression   . Anxiety   . Hyperlipidemia   . Chronic insomnia   . Gout   . GERD (gastroesophageal reflux disease)   . PVD (peripheral vascular disease) (HCC)     PTA/Stent right common iliac  . DDD (degenerative disc disease)   . Hx of colonoscopy   . COPD with asthma ([facility]) 09/08/2007  . OSA (obstructive sleep apnea)     NPSG 09/10/10- AHI 11.3/hr  . Hypertension     dr Percival Spanish  . History of radiation therapy 11/10/13- 12/29/13    left lung 6600 cGy in 33 sessions  . Diabetes mellitus without complication ([facility]) 9/79/8921  . Cancer ([facility])     lung  . Lung cancer ([facility]) 10/04/13    LUL squamous cell lung cancer  . History of cardiovascular stress test     Myoview 7/16:  Diaphragmatic attenuation, no ischemia, EF 56%; Low Risk  . Itching due  to drug 03/19/2016  . Nausea with vomiting 03/26/2016    ALLERGIES:  is allergic to carboplatin.  MEDICATIONS:  Current Outpatient Prescriptions  Medication Sig Dispense Refill  . ADVAIR DISKUS 500-50 MCG/DOSE AEPB 1 PUFF THEN RINSE MOUTH, TWICE DAILY MAINTENANCE 60 each 2  . albuterol (PROVENTIL) (2.5 MG/3ML) 0.083% nebulizer solution INHALE 1 VIAL IN NEBULIZER EVERY 4 HOURS AS NEEDED FOR WHEEZING OR SHORTNESS OF  BREATH 300 mL 2  . albuterol (VENTOLIN HFA) 108 (90 Base) MCG/ACT inhaler INHALE 2 PUFFS INTO THE LUNGS 4 TIMES DAILY AS NEEDED FOR WHEEZING 18 Inhaler 11  . arformoterol (BROVANA) 15 MCG/2ML NEBU Take 2 mLs (15 mcg total) by nebulization 2 (two) times daily. 8 mL 0  . ARIPiprazole (ABILIFY) 5 MG tablet Take 1 tablet (5 mg total) by mouth at bedtime.    . Armodafinil 250 MG tablet Take 250 mg by mouth every morning.  5  . aspirin 81 MG EC tablet TAKE 1 TABLET BY MOUTH EVERY DAY 30 tablet 5  . atorvastatin (LIPITOR) 20 MG tablet TAKE 1 TABLET EVERY DAY (Patient taking differently: TAKE 1 TABLET EVERY DAY IN THE EVENING) 90 tablet 3  . BELSOMRA 20 MG TABS Take 1 tablet by mouth at bedtime as needed (for sleep). Reported on 03/29/2016  4  . clonazePAM (KLONOPIN) 1 MG tablet Take 2 mg by mouth at bedtime as needed for anxiety. Reported on 01/16/2016    . clopidogrel (PLAVIX) 75 MG tablet Take 1 tablet (75 mg total) by mouth daily. 90 tablet 3  . dextromethorphan-guaiFENesin (MUCINEX DM) 30-600 MG 12hr tablet Take 1 tablet by mouth 2 (two) times daily as needed for cough (congestion).    [name] FLUoxetine (PROZAC) 40 MG capsule Take 40 mg by mouth every morning.    . gabapentin (NEURONTIN) 100 MG capsule Take 100 mg by mouth daily.     . isosorbide mononitrate (IMDUR) 60 MG 24 hr tablet Take 60 mg by mouth daily.    [name] levofloxacin (LEVAQUIN) 750 MG tablet Take 1 tablet (750 mg total) by mouth daily. X 6 days 6 tablet 0  . methocarbamol (ROBAXIN) 500 MG tablet Take 500 mg by mouth 2 (two) times daily as needed for muscle spasms. Reported on 01/16/2016    . methylPREDNISolone (MEDROL DOSEPAK) 4 MG TBPK tablet Use as instructed 21 tablet 0  . metoprolol succinate (TOPROL-XL) 25 MG 24 hr tablet TAKE 1/2 TABLET BY MOUTH DAILY 30 tablet 3  . mirtazapine (REMERON) 45 MG tablet Take 1 tablet (45 mg total) by mouth at bedtime.    [name] morphine (MS CONTIN) 30 MG 12 hr tablet Take 1 tablet (30 mg total) by mouth every 12  (twelve) hours. 60 tablet 0  . morphine (MSIR) 15 MG tablet Take 1 tablet (15 mg total) by mouth every 4 (four) hours as needed for severe pain. 30 tablet 0  . nitroGLYCERIN (NITROSTAT) 0.4 MG SL tablet Place 1 tablet (0.4 mg total) under the tongue every 5 (five) minutes as needed for chest pain. 25 tablet 3  . ondansetron (ZOFRAN) 8 MG tablet Take 1 tablet (8 mg total) by mouth every 8 (eight) hours as needed for nausea or vomiting. 20 tablet 0  . OXYGEN Inhale 2 L into the lungs at bedtime as needed.    . pantoprazole (PROTONIX) 40 MG tablet Take 1 tablet (40 mg total) by mouth daily. 30 tablet 11  . potassium chloride SA (K-DUR,KLOR-CON) 20 MEQ tablet Take 1 tablet (20 mEq total)  by mouth every morning. Reported on 12/07/2015 90 tablet 1  . predniSONE (DELTASONE) 20 MG tablet Take 2 tablets (40 mg total) by mouth daily. 8 tablet 0  . predniSONE (DELTASONE) 5 MG tablet Take 5 mg by mouth daily.    . prochlorperazine (COMPAZINE) 10 MG tablet Take 1 tablet (10 mg total) by mouth every 6 (six) hours as needed for nausea or vomiting. 30 tablet 0  . tamsulosin (FLOMAX) 0.4 MG CAPS capsule Take 0.4 mg by mouth daily.   11  . theophylline (UNIPHYL) 400 MG 24 hr tablet TAKE 1 TABLET EVERY DAY 30 tablet 5  . DALIRESP 500 MCG TABS tablet     . esomeprazole (NEXIUM) 40 MG capsule Take 40 mg by mouth daily.  3   No current facility-administered medications for this visit.   Facility-Administered Medications Ordered in Other Visits  Medication Dose Route Frequency Provider Last Rate Last Dose  . DOBUTamine (DOBUTREX) 1,000 mcg/mL in dextrose 5% 250 mL infusion  0-40 mcg/kg/min Intravenous Continuous Fay Records, MD 224.2 mL/hr at 06/12/15 1117 40 mcg/kg/min at 06/12/15 1117    SURGICAL HISTORY:  Past Surgical History  Procedure Laterality Date  . Spinal fusion  03/05/2007    L4-L5  . Hip surgery      left 'bone graft taken"  . Arm surgery      left elbow  . Shoulder surgery      right  .  C-spine surgery    . Angioplasty    . Video bronchoscopy with endobronchial navigation N/A 10/04/2013    Procedure: VIDEO BRONCHOSCOPY WITH ENDOBRONCHIAL NAVIGATION;  Surgeon: Collene Gobble, MD;  Location: [facility];  Service: Thoracic;  Laterality: N/A;  . Back surgery      lower  . Anterior fusion cervical spine      cervical fusion  7 yrs ago ([facility])  . Colon surgery      '11 "Diverticulitis"  . Colonoscopy with propofol N/A 05/22/2015    Procedure: COLONOSCOPY WITH PROPOFOL;  Surgeon: Inda Castle, MD;  Location: [facility];  Service: Endoscopy;  Laterality: N/A;  . Peripheral vascular catheterization N/A 08/10/2015    Procedure: Lower Extremity Angiography;  Surgeon: Lorretta Harp, MD; Distal Ao OK, L-EIA stent OK, R-CIA 100% s/p overlapping 7 mm x 38 mm ICast stents, 50-60% R-CFA        REVIEW OF SYSTEMS:  A comprehensive review of systems was negative except for: Constitutional: positive for fatigue and weight loss Respiratory: positive for cough, dyspnea on exertion, sputum and wheezing Integument/breast: positive for pruritus and rash   PHYSICAL EXAMINATION: General appearance: alert, cooperative and no distress Head: Normocephalic, without obvious abnormality, atraumatic Neck: no adenopathy, no JVD, supple, symmetrical, trachea midline and thyroid not enlarged, symmetric, no tenderness/mass/nodules Lymph nodes: Cervical, supraclavicular, and axillary nodes normal. Resp: wheezes bilaterally Back: symmetric, no curvature. ROM normal. No CVA tenderness. Cardio: regular rate and rhythm, S1, S2 normal, no murmur, click, rub or gallop GI: soft, non-tender; bowel sounds normal; no masses,  no organomegaly Extremities: extremities normal, atraumatic, no cyanosis or edema Neurologic: Alert and oriented X 3, normal strength and tone. Normal symmetric reflexes. Normal coordination and gait  ECOG PERFORMANCE STATUS: 1 - Symptomatic but completely ambulatory  Blood pressure 113/58,  pulse 80, temperature 98.1 F (36.7 C), temperature source Oral, resp. rate 21, height '5\' 6"'$  (1.676 m), weight 197 lb 1.6 oz (89.404 kg), SpO2 95 %.  LABORATORY DATA: Lab Results  Component Value Date  WBC 13.4* 04/09/2016   HGB 12.0* 04/09/2016   HCT 38.3* 04/09/2016   MCV 90.5 04/09/2016   PLT 139* 04/09/2016      Chemistry      Component Value Date/Time   NA 139 04/09/2016 1109   NA 139 03/30/2016 1047   K 4.4 04/09/2016 1109   K 3.3* 03/30/2016 1047   CL 99* 03/30/2016 1047   CO2 32* 04/09/2016 1109   CO2 30 03/30/2016 1047   BUN 12.8 04/09/2016 1109   BUN 10 03/30/2016 1047   CREATININE 0.8 04/09/2016 1109   CREATININE 0.82 03/30/2016 1047   CREATININE 0.80 08/07/2015 0959      Component Value Date/Time   CALCIUM 8.9 04/09/2016 1109   CALCIUM 8.6* 03/30/2016 1047   ALKPHOS 87 04/09/2016 1109   ALKPHOS 84 03/30/2016 1047   AST 15 04/09/2016 1109   AST 21 03/30/2016 1047   ALT 26 04/09/2016 1109   ALT 19 03/30/2016 1047   BILITOT 0.37 04/09/2016 1109   BILITOT 1.0 03/30/2016 1047       RADIOGRAPHIC STUDIES: Dg Chest 2 View  03/30/2016  CLINICAL DATA:  Patient with worsening shortness of breath. EXAM: CHEST  2 VIEW COMPARISON:  Chest radiograph 02/06/2016 FINDINGS: Anterior cervical spinal fusion hardware. Stable enlarged cardiac and mediastinal contours. Interval development of consolidation within the right mid and lower lung. Small right pleural effusion. Re- demonstrated left apical consolidation. Mid thoracic spine degenerative changes. Right lower lobe nodule appears have increased in size as well, demonstrated best on lateral view. IMPRESSION: Interval development of consolidation within the right lower lobe concerning for pneumonia in the appropriate clinical setting. There is an associated new small right pleural effusion. Unchanged left apical pulmonary opacity. Interval increase in size of right lower lobe nodule. Electronically Signed   By: Lovey Newcomer M.D.    On: 03/30/2016 10:34    ASSESSMENT AND PLAN: This is a very pleasant 60 years old white male with stage IIIA non-small cell lung cancer, squamous cell carcinoma currently undergoing concurrent chemoradiation with weekly carboplatin and paclitaxel status post 7 weeks of treatment. He tolerated his treatment fairly well with no significant adverse effects. This was followed by consolidation chemotherapy with carboplatin and paclitaxel status post 3 cycles, .carboplatin was discontinued after cycle #2 secondary to hypersensitivity reaction. The recent CT scan of the chest progressive enlargement of the right lower lobe lung nodule concerning for pulmonary metastasis or new synchronous tumor. The recent PET scan showed hypermetabolic activity in the 1.9 cm right lower lobe pulmonary nodule suspicious for metastasis. There was also small mediastinal node metastasis. CT-guided core biopsy of the right lower lobe pulmonary nodule showed poorly differentiated carcinoma. PDL 1 expression was 0% The patient is currently undergoing systemic chemotherapy with single agent Abraxane on days 1 and 8 every 3 weeks. He is status post 2 cycles.  I recommended for the patient to proceed with cycle #3 today as a scheduled. He would come back for follow-up visit in 3 weeks for evaluation before starting cycle #4 after repeating CT scan of the chest for restaging of his disease. He was advised to call immediately if he has any concerning symptoms in the interval. The patient voices understanding of current disease status and treatment options and is in agreement with the current care plan.  All questions were answered. The patient knows to call the clinic with any problems, questions or concerns. We can certainly see the patient much sooner if necessary.  Disclaimer: This note  was dictated with voice recognition software. Similar sounding words can inadvertently be transcribed and may not be corrected upon review.

## 2016-04-09 NOTE — Patient Instructions (Signed)
[facility] Discharge Instructions for Patients Receiving Chemotherapy  Today you received the following chemotherapy agent: Abraxane. To help prevent nausea and vomiting after your treatment, we encourage you to take your nausea medication. If you develop nausea and vomiting that is not controlled by your nausea medication, call the clinic.   BELOW ARE SYMPTOMS THAT SHOULD BE REPORTED IMMEDIATELY:  *FEVER GREATER THAN 100.5 F  *CHILLS WITH OR WITHOUT FEVER  NAUSEA AND VOMITING THAT IS NOT CONTROLLED WITH YOUR NAUSEA MEDICATION  *UNUSUAL SHORTNESS OF BREATH  *UNUSUAL BRUISING OR BLEEDING  TENDERNESS IN MOUTH AND THROAT WITH OR WITHOUT PRESENCE OF ULCERS  *URINARY PROBLEMS  *BOWEL PROBLEMS  UNUSUAL RASH Items with * indicate a potential emergency and should be followed up as soon as possible.  Feel free to call the clinic you have any questions or concerns. The clinic phone number is [phone].  Please show the [facility] at check-in to the Emergency Department and triage nurse.

## 2016-04-09 NOTE — Telephone Encounter (Signed)
Gave pt apt & avs °

## 2016-04-11 ENCOUNTER — Other Ambulatory Visit: Payer: Self-pay | Admitting: Internal Medicine

## 2016-04-12 ENCOUNTER — Ambulatory Visit: Payer: Medicare HMO | Admitting: Internal Medicine

## 2016-04-14 NOTE — Progress Notes (Signed)
No show

## 2016-04-15 ENCOUNTER — Encounter: Payer: Commercial Managed Care - HMO | Admitting: Cardiology

## 2016-04-16 ENCOUNTER — Ambulatory Visit (INDEPENDENT_AMBULATORY_CARE_PROVIDER_SITE_OTHER): Payer: Commercial Managed Care - HMO | Admitting: Internal Medicine

## 2016-04-16 ENCOUNTER — Encounter: Payer: Self-pay | Admitting: Internal Medicine

## 2016-04-16 ENCOUNTER — Other Ambulatory Visit (HOSPITAL_BASED_OUTPATIENT_CLINIC_OR_DEPARTMENT_OTHER): Payer: Commercial Managed Care - HMO

## 2016-04-16 ENCOUNTER — Ambulatory Visit (INDEPENDENT_AMBULATORY_CARE_PROVIDER_SITE_OTHER)
Admission: RE | Admit: 2016-04-16 | Discharge: 2016-04-16 | Disposition: A | Discharge status: Home / Self Care | Payer: Commercial Managed Care - HMO | Source: Ambulatory Visit | Attending: Internal Medicine | Admitting: Internal Medicine

## 2016-04-16 ENCOUNTER — Ambulatory Visit (HOSPITAL_BASED_OUTPATIENT_CLINIC_OR_DEPARTMENT_OTHER): Payer: Commercial Managed Care - HMO

## 2016-04-16 ENCOUNTER — Encounter: Payer: Self-pay | Admitting: *Deleted

## 2016-04-16 VITALS — BP 116/68 | HR 71 | Temp 98.1°F | Resp 18

## 2016-04-16 VITALS — BP 128/64 | HR 73 | Temp 98.1°F | Resp 16 | Ht 66.0 in

## 2016-04-16 DIAGNOSIS — J449 Chronic obstructive pulmonary disease, unspecified: Secondary | ICD-10-CM | POA: Diagnosis not present

## 2016-04-16 DIAGNOSIS — C3491 Malignant neoplasm of unspecified part of right bronchus or lung: Secondary | ICD-10-CM | POA: Diagnosis not present

## 2016-04-16 DIAGNOSIS — J181 Lobar pneumonia, unspecified organism: Secondary | ICD-10-CM

## 2016-04-16 DIAGNOSIS — G894 Chronic pain syndrome: Secondary | ICD-10-CM | POA: Diagnosis not present

## 2016-04-16 DIAGNOSIS — H109 Unspecified conjunctivitis: Secondary | ICD-10-CM | POA: Diagnosis not present

## 2016-04-16 DIAGNOSIS — Z5111 Encounter for antineoplastic chemotherapy: Secondary | ICD-10-CM

## 2016-04-16 DIAGNOSIS — J189 Pneumonia, unspecified organism: Secondary | ICD-10-CM | POA: Diagnosis not present

## 2016-04-16 DIAGNOSIS — J45909 Unspecified asthma, uncomplicated: Secondary | ICD-10-CM

## 2016-04-16 LAB — COMPREHENSIVE METABOLIC PANEL
ALK PHOS: 102 U/L (ref 40–150)
ALT: 28 U/L (ref 0–55)
ANION GAP: 9 meq/L (ref 3–11)
AST: 17 U/L (ref 5–34)
Albumin: 3.5 g/dL (ref 3.5–5.0)
BUN: 10 mg/dL (ref 7.0–26.0)
CALCIUM: 9.3 mg/dL (ref 8.4–10.4)
CO2: 31 mEq/L — ABNORMAL HIGH (ref 22–29)
Chloride: 101 mEq/L (ref 98–109)
Creatinine: 0.8 mg/dL (ref 0.7–1.3)
Glucose: 146 mg/dl — ABNORMAL HIGH (ref 70–140)
POTASSIUM: 4 meq/L (ref 3.5–5.1)
SODIUM: 140 meq/L (ref 136–145)
TOTAL PROTEIN: 6 g/dL — AB (ref 6.4–8.3)
Total Bilirubin: 0.43 mg/dL (ref 0.20–1.20)

## 2016-04-16 LAB — CBC WITH DIFFERENTIAL/PLATELET
BASO%: 0.7 % (ref 0.0–2.0)
Basophils Absolute: 0.1 10*3/uL (ref 0.0–0.1)
EOS%: 0.6 % (ref 0.0–7.0)
Eosinophils Absolute: 0.1 10*3/uL (ref 0.0–0.5)
HCT: 36.7 % — ABNORMAL LOW (ref 38.4–49.9)
HGB: 11.7 g/dL — ABNORMAL LOW (ref 13.0–17.1)
LYMPH#: 0.5 10*3/uL — AB (ref 0.9–3.3)
LYMPH%: 4.6 % — AB (ref 14.0–49.0)
MCH: 28.5 pg (ref 27.2–33.4)
MCHC: 31.8 g/dL — AB (ref 32.0–36.0)
MCV: 89.5 fL (ref 79.3–98.0)
MONO#: 0.4 10*3/uL (ref 0.1–0.9)
MONO%: 4 % (ref 0.0–14.0)
NEUT#: 9.3 10*3/uL — ABNORMAL HIGH (ref 1.5–6.5)
NEUT%: 90.1 % — AB (ref 39.0–75.0)
PLATELETS: 98 10*3/uL — AB (ref 140–400)
RBC: 4.1 10*6/uL — AB (ref 4.20–5.82)
RDW: 18.6 % — ABNORMAL HIGH (ref 11.0–14.6)
WBC: 10.4 10*3/uL — AB (ref 4.0–10.3)

## 2016-04-16 MED ORDER — PACLITAXEL PROTEIN-BOUND CHEMO INJECTION 100 MG
100.0000 mg/m2 | Freq: Once | INTRAVENOUS | Status: AC
  Administered 2016-04-16: 200 mg via INTRAVENOUS
  Filled 2016-04-16: qty 40

## 2016-04-16 MED ORDER — SODIUM CHLORIDE 0.9 % IV SOLN
Freq: Once | INTRAVENOUS | Status: AC
  Administered 2016-04-16: 14:00:00 via INTRAVENOUS

## 2016-04-16 MED ORDER — TIOTROPIUM BROMIDE MONOHYDRATE 1.25 MCG/ACT IN AERS: 2.0000 | INHALATION_SPRAY | Freq: Every day | RESPIRATORY_TRACT | Status: DC

## 2016-04-16 MED ORDER — MORPHINE SULFATE 15 MG PO TABS: 15.0000 mg | ORAL_TABLET | ORAL | Status: DC | PRN

## 2016-04-16 MED ORDER — SODIUM CHLORIDE 0.9 % IV SOLN
Freq: Once | INTRAVENOUS | Status: AC
Start: 2016-04-16 — End: 2016-04-16
  Administered 2016-04-16: 14:00:00 via INTRAVENOUS
  Filled 2016-04-16: qty 4

## 2016-04-16 MED ORDER — TOBRAMYCIN-DEXAMETHASONE 0.3-0.1 % OP SUSP: 2.0000 [drp] | Freq: Four times a day (QID) | OPHTHALMIC | Status: DC

## 2016-04-16 MED ORDER — MORPHINE SULFATE ER 30 MG PO TBCR: 30.0000 mg | EXTENDED_RELEASE_TABLET | Freq: Two times a day (BID) | ORAL | Status: DC

## 2016-04-16 NOTE — Patient Instructions (Signed)

## 2016-04-16 NOTE — Progress Notes (Signed)
Pre visit review using our clinic review tool, if applicable. No additional management support is needed unless otherwise documented below in the visit note. 

## 2016-04-16 NOTE — Patient Instructions (Signed)
Chronic Obstructive Pulmonary Disease Chronic obstructive pulmonary disease (COPD) is a common lung condition in which airflow from the lungs is limited. COPD is a general term that can be used to describe many different lung problems that limit airflow, including both chronic bronchitis and emphysema. If you have COPD, your lung function will probably never return to normal, but there are measures you can take to improve lung function and make yourself feel better. CAUSES   Smoking (common).  Exposure to secondhand smoke.  Genetic problems.  Chronic inflammatory lung diseases or recurrent infections. SYMPTOMS  Shortness of breath, especially with physical activity.  Deep, persistent (chronic) cough with a large amount of thick mucus.  Wheezing.  Rapid breaths (tachypnea).  Gray or bluish discoloration (cyanosis) of the skin, especially in your fingers, toes, or lips.  Fatigue.  Weight loss.  Frequent infections or episodes when breathing symptoms become much worse (exacerbations).  Chest tightness. DIAGNOSIS Your health care provider will take a medical history and perform a physical examination to diagnose COPD. Additional tests for COPD may include:  Lung (pulmonary) function tests.  Chest X-ray.  CT scan.  Blood tests. TREATMENT  Treatment for COPD may include:  Inhaler and nebulizer medicines. These help manage the symptoms of COPD and make your breathing more comfortable.  Supplemental oxygen. Supplemental oxygen is only helpful if you have a low oxygen level in your blood.  Exercise and physical activity. These are beneficial for nearly all people with COPD.  Lung surgery or transplant.  Nutrition therapy to gain weight, if you are underweight.  Pulmonary rehabilitation. This may involve working with a team of health care providers and specialists, such as respiratory, occupational, and physical therapists. HOME CARE INSTRUCTIONS  Take all medicines  (inhaled or pills) as directed by your health care provider.  Avoid over-the-counter medicines or cough syrups that dry up your airway (such as antihistamines) and slow down the elimination of secretions unless instructed otherwise by your health care provider.  If you are a smoker, the most important thing that you can do is stop smoking. Continuing to smoke will cause further lung damage and breathing trouble. Ask your health care provider for help with quitting smoking. He or she can direct you to community resources or hospitals that provide support.  Avoid exposure to irritants such as smoke, chemicals, and fumes that aggravate your breathing.  Use oxygen therapy and pulmonary rehabilitation if directed by your health care provider. If you require home oxygen therapy, ask your health care provider whether you should purchase a pulse oximeter to measure your oxygen level at home.  Avoid contact with individuals who have a contagious illness.  Avoid extreme temperature and humidity changes.  Eat healthy foods. Eating smaller, more frequent meals and resting before meals may help you maintain your strength.  Stay active, but balance activity with periods of rest. Exercise and physical activity will help you maintain your ability to do things you want to do.  Preventing infection and hospitalization is very important when you have COPD. Make sure to receive all the vaccines your health care provider recommends, especially the pneumococcal and influenza vaccines. Ask your health care provider whether you need a pneumonia vaccine.  Learn and use relaxation techniques to manage stress.  Learn and use controlled breathing techniques as directed by your health care provider. Controlled breathing techniques include:  Pursed lip breathing. Start by breathing in (inhaling) through your nose for 1 second. Then, purse your lips as if you were   going to whistle and breathe out (exhale) through the  pursed lips for 2 seconds.  Diaphragmatic breathing. Start by putting one hand on your abdomen just above your waist. Inhale slowly through your nose. The hand on your abdomen should move out. Then purse your lips and exhale slowly. You should be able to feel the hand on your abdomen moving in as you exhale.  Learn and use controlled coughing to clear mucus from your lungs. Controlled coughing is a series of short, progressive coughs. The steps of controlled coughing are: 1. Lean your head slightly forward. 2. Breathe in deeply using diaphragmatic breathing. 3. Try to hold your breath for 3 seconds. 4. Keep your mouth slightly open while coughing twice. 5. Spit any mucus out into a tissue. 6. Rest and repeat the steps once or twice as needed. SEEK MEDICAL CARE IF:  You are coughing up more mucus than usual.  There is a change in the color or thickness of your mucus.  Your breathing is more labored than usual.  Your breathing is faster than usual. SEEK IMMEDIATE MEDICAL CARE IF:  You have shortness of breath while you are resting.  You have shortness of breath that prevents you from:  Being able to talk.  Performing your usual physical activities.  You have chest pain lasting longer than 5 minutes.  Your skin color is more cyanotic than usual.  You measure low oxygen saturations for longer than 5 minutes with a pulse oximeter. MAKE SURE YOU:  Understand these instructions.  Will watch your condition.  Will get help right away if you are not doing well or get worse.   This information is not intended to replace advice given to you by your health care provider. Make sure you discuss any questions you have with your health care provider.   Document Released: 08/21/2005 Document Revised: 12/02/2014 Document Reviewed: 07/08/2013 Elsevier Interactive Patient Education 2016 [employer]  

## 2016-04-16 NOTE — Progress Notes (Signed)
Subjective:  Patient ID: Ernest [name], male    DOB: Dec 02, 1955  Age: 60 y.o. MRN: [id number]  CC: Cough   HPI Lena Fieldhouse Mcgrory presents for follow-up after recent diagnosis of right lower lobe pneumonia. He tells me that he is feeling much better with a decrease in his cough. His shortness of breath has improved, his cough is rarely productive of yellowish phlegm, he complains of persistent wheezing but denies hemoptysis.  Outpatient Prescriptions Prior to Visit  Medication Sig Dispense Refill  . ADVAIR DISKUS 500-50 MCG/DOSE AEPB 1 PUFF THEN RINSE MOUTH, TWICE DAILY MAINTENANCE 60 each 2  . albuterol (PROVENTIL) (2.5 MG/3ML) 0.083% nebulizer solution INHALE 1 VIAL IN NEBULIZER EVERY 4 HOURS AS NEEDED FOR WHEEZING OR SHORTNESS OF BREATH 300 mL 2  . albuterol (VENTOLIN HFA) 108 (90 Base) MCG/ACT inhaler INHALE 2 PUFFS INTO THE LUNGS 4 TIMES DAILY AS NEEDED FOR WHEEZING 18 Inhaler 11  . ARIPiprazole (ABILIFY) 5 MG tablet Take 1 tablet (5 mg total) by mouth at bedtime.    . Armodafinil 250 MG tablet Take 250 mg by mouth every morning.  5  . aspirin 81 MG EC tablet TAKE 1 TABLET BY MOUTH EVERY DAY 30 tablet 5  . atorvastatin (LIPITOR) 20 MG tablet TAKE 1 TABLET EVERY DAY (Patient taking differently: TAKE 1 TABLET EVERY DAY IN THE EVENING) 90 tablet 3  . BELSOMRA 20 MG TABS Take 1 tablet by mouth at bedtime as needed (for sleep). Reported on 03/29/2016  4  . clonazePAM (KLONOPIN) 1 MG tablet Take 2 mg by mouth at bedtime as needed for anxiety. Reported on 01/16/2016    . clopidogrel (PLAVIX) 75 MG tablet Take 1 tablet (75 mg total) by mouth daily. 90 tablet 3  . DALIRESP 500 MCG TABS tablet     . dextromethorphan-guaiFENesin (MUCINEX DM) 30-600 MG 12hr tablet Take 1 tablet by mouth 2 (two) times daily as needed for cough (congestion).    [name] esomeprazole (NEXIUM) 40 MG capsule Take 40 mg by mouth daily.  3  . FLUoxetine (PROZAC) 40 MG capsule Take 40 mg by mouth every morning.    .  gabapentin (NEURONTIN) 100 MG capsule Take 100 mg by mouth daily.     . isosorbide mononitrate (IMDUR) 60 MG 24 hr tablet Take 60 mg by mouth daily.    . methocarbamol (ROBAXIN) 500 MG tablet Take 500 mg by mouth 2 (two) times daily as needed for muscle spasms. Reported on 01/16/2016    . metoprolol succinate (TOPROL-XL) 25 MG 24 hr tablet TAKE 1/2 TABLET BY MOUTH DAILY 30 tablet 3  . mirtazapine (REMERON) 45 MG tablet Take 1 tablet (45 mg total) by mouth at bedtime.    . nitroGLYCERIN (NITROSTAT) 0.4 MG SL tablet Place 1 tablet (0.4 mg total) under the tongue every 5 (five) minutes as needed for chest pain. 25 tablet 3  . ondansetron (ZOFRAN) 8 MG tablet TAKE 1 TABLET (8 MG TOTAL) BY MOUTH EVERY 8 (EIGHT) HOURS AS NEEDED FOR NAUSEA OR VOMITING. 20 tablet 0  . OXYGEN Inhale 2 L into the lungs at bedtime as needed.    . pantoprazole (PROTONIX) 40 MG tablet Take 1 tablet (40 mg total) by mouth daily. 30 tablet 11  . potassium chloride SA (K-DUR,KLOR-CON) 20 MEQ tablet Take 1 tablet (20 mEq total) by mouth every morning. Reported on 12/07/2015 90 tablet 1  . predniSONE (DELTASONE) 5 MG tablet Take 5 mg by mouth daily.    . prochlorperazine (  COMPAZINE) 10 MG tablet TAKE 1 TABLET EVERY 6 HOURS AS NEEDED FOR NAUSEA AND VOMITING 30 tablet 0  . tamsulosin (FLOMAX) 0.4 MG CAPS capsule Take 0.4 mg by mouth daily.   11  . theophylline (UNIPHYL) 400 MG 24 hr tablet TAKE 1 TABLET EVERY DAY 30 tablet 5  . arformoterol (BROVANA) 15 MCG/2ML NEBU Take 2 mLs (15 mcg total) by nebulization 2 (two) times daily. 8 mL 0  . morphine (MS CONTIN) 30 MG 12 hr tablet Take 1 tablet (30 mg total) by mouth every 12 (twelve) hours. 60 tablet 0  . morphine (MSIR) 15 MG tablet Take 1 tablet (15 mg total) by mouth every 4 (four) hours as needed for severe pain. 30 tablet 0  . predniSONE (DELTASONE) 20 MG tablet Take 2 tablets (40 mg total) by mouth daily. 8 tablet 0  . levofloxacin (LEVAQUIN) 750 MG tablet Take 1 tablet (750 mg  total) by mouth daily. X 6 days 6 tablet 0  . methylPREDNISolone (MEDROL DOSEPAK) 4 MG TBPK tablet Use as instructed 21 tablet 0   Facility-Administered Medications Prior to Visit  Medication Dose Route Frequency Provider Last Rate Last Dose  . DOBUTamine (DOBUTREX) 1,000 mcg/mL in dextrose 5% 250 mL infusion  0-40 mcg/kg/min Intravenous Continuous Fay Records, MD 224.2 mL/hr at 06/12/15 1117 40 mcg/kg/min at 06/12/15 1117    ROS Review of Systems  Constitutional: Negative.  Negative for fever, chills, diaphoresis, appetite change and fatigue.  HENT: Negative.  Negative for facial swelling, sore throat and trouble swallowing.   Eyes: Positive for discharge, redness and itching. Negative for photophobia, pain and visual disturbance.       He complains of a one-week history of bilateral eye irritation with matting in the morning, watering, and irritation.  Respiratory: Positive for cough, shortness of breath and wheezing. Negative for apnea, choking, chest tightness and stridor.   Cardiovascular: Negative.  Negative for chest pain, palpitations and leg swelling.  Gastrointestinal: Negative.  Negative for nausea, vomiting, abdominal pain, diarrhea and constipation.  Endocrine: Negative.   Genitourinary: Negative.   Musculoskeletal: Negative.  Negative for myalgias, back pain and arthralgias.  Skin: Negative.  Negative for color change and rash.  Allergic/Immunologic: Negative.   Neurological: Negative.  Negative for dizziness.  Hematological: Negative.  Negative for adenopathy. Does not bruise/bleed easily.  Psychiatric/Behavioral: Negative.     Objective:  BP 128/64 mmHg  Pulse 73  Temp(Src) 98.1 F (36.7 C) (Oral)  Resp 16  Ht '5\' 6"'$  (1.676 m)  SpO2 95%  BP Readings from Last 3 Encounters:  04/16/16 116/68  04/16/16 128/64  04/09/16 113/58    Wt Readings from Last 3 Encounters:  04/09/16 197 lb 1.6 oz (89.404 kg)  03/30/16 194 lb (87.998 kg)  03/19/16 197 lb 9.6 oz (89.631  kg)    Physical Exam  Constitutional: He is oriented to person, place, and time. No distress.  HENT:  Mouth/Throat: Oropharynx is clear and moist. No oropharyngeal exudate.  Eyes: EOM are normal. Pupils are equal, round, and reactive to light. Right eye exhibits no chemosis, no discharge, no exudate and no hordeolum. No foreign body present in the right eye. Left eye exhibits no chemosis, no discharge, no exudate and no hordeolum. No foreign body present in the left eye. Right conjunctiva is injected. Right conjunctiva has no hemorrhage. Left conjunctiva is injected. Left conjunctiva has no hemorrhage. No scleral icterus. Right eye exhibits normal extraocular motion and no nystagmus. Left eye exhibits normal extraocular motion  and no nystagmus. Right pupil is round and reactive. Left pupil is round and reactive. Pupils are equal.  Neck: Normal range of motion. Neck supple. No JVD present. No tracheal deviation present. No thyromegaly present.  Cardiovascular: Normal rate, regular rhythm, normal heart sounds and intact distal pulses.  Exam reveals no gallop and no friction rub.   No murmur heard. Pulmonary/Chest: Effort normal. No accessory muscle usage or stridor. No respiratory distress. He has decreased breath sounds in the right lower field. He has wheezes in the right upper field, the right middle field, the left upper field, the left middle field and the left lower field. He has rhonchi in the right upper field, the right middle field, the right lower field, the left upper field, the left middle field and the left lower field. He has no rales.  Abdominal: Soft. Bowel sounds are normal. He exhibits no distension and no mass. There is no tenderness. There is no rebound and no guarding.  Musculoskeletal: Normal range of motion. He exhibits no edema or tenderness.  Lymphadenopathy:    He has no cervical adenopathy.  Neurological: He is oriented to person, place, and time.  Skin: Skin is warm and  dry. No rash noted. He is not diaphoretic. No erythema. No pallor.  Vitals reviewed.   Lab Results  Component Value Date   WBC 10.4* 04/16/2016   HGB 11.7* 04/16/2016   HCT 36.7* 04/16/2016   PLT 98* 04/16/2016   GLUCOSE 146* 04/16/2016   CHOL 164 10/05/2015   TRIG 174.0* 10/05/2015   HDL 48.70 10/05/2015   LDLDIRECT 71.4 09/08/2012   LDLCALC 80 10/05/2015   ALT 28 04/16/2016   AST 17 04/16/2016   NA 140 04/16/2016   K 4.0 04/16/2016   CL 99* 03/30/2016   CREATININE 0.8 04/16/2016   BUN 10.0 04/16/2016   CO2 31* 04/16/2016   TSH 2.353 08/07/2015   PSA 0.64 07/18/2011   INR 0.99 02/06/2016   HGBA1C 6.8* 01/08/2016   MICROALBUR 5.7* 10/05/2015    Dg Chest 2 View  03/30/2016  CLINICAL DATA:  Patient with worsening shortness of breath. EXAM: CHEST  2 VIEW COMPARISON:  Chest radiograph 02/06/2016 FINDINGS: Anterior cervical spinal fusion hardware. Stable enlarged cardiac and mediastinal contours. Interval development of consolidation within the right mid and lower lung. Small right pleural effusion. Re- demonstrated left apical consolidation. Mid thoracic spine degenerative changes. Right lower lobe nodule appears have increased in size as well, demonstrated best on lateral view. IMPRESSION: Interval development of consolidation within the right lower lobe concerning for pneumonia in the appropriate clinical setting. There is an associated new small right pleural effusion. Unchanged left apical pulmonary opacity. Interval increase in size of right lower lobe nodule. Electronically Signed   By: Lovey Newcomer M.D.   On: 03/30/2016 10:34    Assessment & Plan:   Vincent was seen today for cough.  Diagnoses and all orders for this visit:  Chronic pain syndrome -     morphine (MS CONTIN) 30 MG 12 hr tablet; Take 1 tablet (30 mg total) by mouth every 12 (twelve) hours. -     morphine (MSIR) 15 MG tablet; Take 1 tablet (15 mg total) by mouth every 4 (four) hours as needed for severe  pain.  RLL pneumonia- Based on his symptoms and today's chest x-ray he is showing significant improvement, will continue to follow him off of antibiotics for now, he will let me know if he develops any new symptoms. -  DG Chest 2 View; Future  COPD with asthma ([place])- he has only gotten modest symptom relief with the combination of albuterol and the Advair Diskus, I will add an inhaled anticholinergic for additional symptom relief. -     Tiotropium Bromide Monohydrate (SPIRIVA RESPIMAT) 1.25 MCG/ACT AERS; Inhale 2 puffs into the lungs daily.  Bilateral conjunctivitis- can't tell for certain if this is viral or bacterial so will treat with an ophthalmic solution containing an antibiotic as well as a steroid. -     tobramycin-dexamethasone (TOBRADEX) ophthalmic solution; Place 2 drops into both eyes every 6 (six) hours.   I have discontinued Mr. Bankhead's arformoterol, methylPREDNISolone, levofloxacin, and methylPREDNISolone. I am also having him start on tobramycin-dexamethasone and Tiotropium Bromide Monohydrate. Additionally, I am having him maintain his clonazePAM, ARIPiprazole, methocarbamol, nitroGLYCERIN, mirtazapine, FLUoxetine, gabapentin, atorvastatin, clopidogrel, potassium chloride SA, aspirin, BELSOMRA, isosorbide mononitrate, predniSONE, OXYGEN, ADVAIR DISKUS, dextromethorphan-guaiFENesin, albuterol, tamsulosin, Armodafinil, metoprolol succinate, theophylline, albuterol, pantoprazole, DALIRESP, esomeprazole, ondansetron, prochlorperazine, morphine, and morphine.  Meds ordered this encounter  Medications  . morphine (MS CONTIN) 30 MG 12 hr tablet    Sig: Take 1 tablet (30 mg total) by mouth every 12 (twelve) hours.    Dispense:  60 tablet    Refill:  0  . morphine (MSIR) 15 MG tablet    Sig: Take 1 tablet (15 mg total) by mouth every 4 (four) hours as needed for severe pain.    Dispense:  30 tablet    Refill:  0  . DISCONTD: methylPREDNISolone (MEDROL DOSEPAK) 4 MG TBPK tablet     Sig: See admin instructions.    Refill:  0  . tobramycin-dexamethasone (TOBRADEX) ophthalmic solution    Sig: Place 2 drops into both eyes every 6 (six) hours.    Dispense:  5 mL    Refill:  0  . Tiotropium Bromide Monohydrate (SPIRIVA RESPIMAT) 1.25 MCG/ACT AERS    Sig: Inhale 2 puffs into the lungs daily.    Dispense:  4 g    Refill:  11     Follow-up: Return in about 4 weeks (around 05/14/2016).  [name], MD

## 2016-04-16 NOTE — Progress Notes (Signed)
CBC reviewed with Dr. Burr Medico (on-call) OK to treat with PLT 98k. Dixie, Infusion RN made aware.

## 2016-04-17 DIAGNOSIS — J449 Chronic obstructive pulmonary disease, unspecified: Secondary | ICD-10-CM | POA: Diagnosis not present

## 2016-04-19 ENCOUNTER — Other Ambulatory Visit: Payer: Self-pay | Admitting: *Deleted

## 2016-04-19 ENCOUNTER — Encounter: Payer: Self-pay | Admitting: *Deleted

## 2016-04-19 NOTE — Patient Outreach (Signed)
[facility]) Care Management [place] Telephone Outreach 04/19/2016  Ernest [name] 1956-02-11 [id number]  Successful telephone outreach to Ernest [name]) is an 60 y.o. male followed by Ernest [name] for ongoing self management for COPD. Ernest [name] has lung cancer and recently started a new round of chemotherapy.  Today, we had a scheduled routine home visit, and Ernest [name] called to cancel this appointment, stating that he "can't get out of the bathroom," because he has an "upset stomach."  Ernest [name] reports nausea, vomiting, and diarrhea that started "last night."  Ernest [name] reports that he has continued having a "very hard time," with his new chemo drug regimen, stating, "this week has been really bad."  Ernest [name] believes that his "upset stomach" is related to the chemotherapy he received on Tuesday of this week.  Ernest [name] states that he has kept his medical providers aware of the symptoms he continues having around this new chemo regimen, and states, "I have told them what it's doing to me."  Ernest [name] also stated that he left a voicemail message for the office staff yesterday at Dr. Worthy Flank office about his current state of GI distress, and "is waiting to hear back from them."  Ernest [name] reports that he believes the pneumonia he had recently "is better," and confirmed that he has maintained follow up with his PCP, who did a CXR "this week," and "told me it was clearing up."  Ernest [name] reports that his SaO2 levels at home have been 88-89%, which is consistent with his baseline readings at home on continuous 3-4 L/min O2.  Ernest [name] reports that there were recent changes made to his medicines by his PCP, but denies being able to go over medications today, as he "feels too sick."  We discussed a plan of action around that Ernest [name] should take for respiratory and GI distress, should his condition worsen, including calling provider teams and going to the ED if necessary.  Ernest [name] confirms that he will  follow discussed action plan if his condition worsens.  Plan: Ernest [name] will follow up with his medical providers should his current condition worsen, and will call EMS for transport to the hospital if necessary. Ernest [name] will take his medications as prescribed and keep all scheduled provider appointments. Ernest [name] will call his providers for any new concerns, issues, questions or problems. [place] in-home visit scheduled today for next month.  [name], RN, BSN, [employer] [facility] Care Management  [phone]

## 2016-04-21 ENCOUNTER — Emergency Department (HOSPITAL_COMMUNITY): Payer: Commercial Managed Care - HMO

## 2016-04-21 ENCOUNTER — Encounter (HOSPITAL_COMMUNITY): Payer: Self-pay | Admitting: Emergency Medicine

## 2016-04-21 ENCOUNTER — Inpatient Hospital Stay (HOSPITAL_COMMUNITY)
Admission: EM | Admit: 2016-04-21 | Discharge: 2016-04-25 | DRG: 871 | Disposition: A | Discharge status: Home Health | Payer: Commercial Managed Care - HMO | Attending: Internal Medicine | Admitting: Internal Medicine

## 2016-04-21 DIAGNOSIS — J9601 Acute respiratory failure with hypoxia: Secondary | ICD-10-CM | POA: Diagnosis not present

## 2016-04-21 DIAGNOSIS — Z7951 Long term (current) use of inhaled steroids: Secondary | ICD-10-CM | POA: Diagnosis not present

## 2016-04-21 DIAGNOSIS — F419 Anxiety disorder, unspecified: Secondary | ICD-10-CM | POA: Diagnosis present

## 2016-04-21 DIAGNOSIS — R05 Cough: Secondary | ICD-10-CM

## 2016-04-21 DIAGNOSIS — J45909 Unspecified asthma, uncomplicated: Secondary | ICD-10-CM | POA: Diagnosis not present

## 2016-04-21 DIAGNOSIS — Z923 Personal history of irradiation: Secondary | ICD-10-CM

## 2016-04-21 DIAGNOSIS — E872 Acidosis: Secondary | ICD-10-CM | POA: Diagnosis present

## 2016-04-21 DIAGNOSIS — Z8249 Family history of ischemic heart disease and other diseases of the circulatory system: Secondary | ICD-10-CM | POA: Diagnosis not present

## 2016-04-21 DIAGNOSIS — J449 Chronic obstructive pulmonary disease, unspecified: Secondary | ICD-10-CM | POA: Diagnosis not present

## 2016-04-21 DIAGNOSIS — E785 Hyperlipidemia, unspecified: Secondary | ICD-10-CM | POA: Diagnosis present

## 2016-04-21 DIAGNOSIS — J440 Chronic obstructive pulmonary disease with acute lower respiratory infection: Secondary | ICD-10-CM | POA: Diagnosis not present

## 2016-04-21 DIAGNOSIS — Z888 Allergy status to other drugs, medicaments and biological substances status: Secondary | ICD-10-CM

## 2016-04-21 DIAGNOSIS — Z7902 Long term (current) use of antithrombotics/antiplatelets: Secondary | ICD-10-CM

## 2016-04-21 DIAGNOSIS — I2510 Atherosclerotic heart disease of native coronary artery without angina pectoris: Secondary | ICD-10-CM | POA: Diagnosis not present

## 2016-04-21 DIAGNOSIS — Z801 Family history of malignant neoplasm of trachea, bronchus and lung: Secondary | ICD-10-CM

## 2016-04-21 DIAGNOSIS — F5104 Psychophysiologic insomnia: Secondary | ICD-10-CM | POA: Diagnosis present

## 2016-04-21 DIAGNOSIS — E119 Type 2 diabetes mellitus without complications: Secondary | ICD-10-CM | POA: Diagnosis not present

## 2016-04-21 DIAGNOSIS — K219 Gastro-esophageal reflux disease without esophagitis: Secondary | ICD-10-CM | POA: Diagnosis present

## 2016-04-21 DIAGNOSIS — I10 Essential (primary) hypertension: Secondary | ICD-10-CM | POA: Diagnosis not present

## 2016-04-21 DIAGNOSIS — Z981 Arthrodesis status: Secondary | ICD-10-CM | POA: Diagnosis not present

## 2016-04-21 DIAGNOSIS — C3412 Malignant neoplasm of upper lobe, left bronchus or lung: Secondary | ICD-10-CM | POA: Diagnosis present

## 2016-04-21 DIAGNOSIS — J90 Pleural effusion, not elsewhere classified: Secondary | ICD-10-CM | POA: Diagnosis not present

## 2016-04-21 DIAGNOSIS — F329 Major depressive disorder, single episode, unspecified: Secondary | ICD-10-CM | POA: Diagnosis present

## 2016-04-21 DIAGNOSIS — G8929 Other chronic pain: Secondary | ICD-10-CM | POA: Diagnosis present

## 2016-04-21 DIAGNOSIS — E1151 Type 2 diabetes mellitus with diabetic peripheral angiopathy without gangrene: Secondary | ICD-10-CM | POA: Diagnosis not present

## 2016-04-21 DIAGNOSIS — J9611 Chronic respiratory failure with hypoxia: Secondary | ICD-10-CM | POA: Diagnosis not present

## 2016-04-21 DIAGNOSIS — J189 Pneumonia, unspecified organism: Secondary | ICD-10-CM | POA: Diagnosis not present

## 2016-04-21 DIAGNOSIS — C3492 Malignant neoplasm of unspecified part of left bronchus or lung: Secondary | ICD-10-CM

## 2016-04-21 DIAGNOSIS — M109 Gout, unspecified: Secondary | ICD-10-CM | POA: Diagnosis present

## 2016-04-21 DIAGNOSIS — Z79899 Other long term (current) drug therapy: Secondary | ICD-10-CM

## 2016-04-21 DIAGNOSIS — A419 Sepsis, unspecified organism: Principal | ICD-10-CM | POA: Diagnosis present

## 2016-04-21 DIAGNOSIS — Y95 Nosocomial condition: Secondary | ICD-10-CM | POA: Diagnosis present

## 2016-04-21 DIAGNOSIS — Z825 Family history of asthma and other chronic lower respiratory diseases: Secondary | ICD-10-CM | POA: Diagnosis not present

## 2016-04-21 DIAGNOSIS — R059 Cough, unspecified: Secondary | ICD-10-CM

## 2016-04-21 DIAGNOSIS — Z7982 Long term (current) use of aspirin: Secondary | ICD-10-CM | POA: Diagnosis not present

## 2016-04-21 DIAGNOSIS — D696 Thrombocytopenia, unspecified: Secondary | ICD-10-CM | POA: Diagnosis not present

## 2016-04-21 DIAGNOSIS — Z9981 Dependence on supplemental oxygen: Secondary | ICD-10-CM

## 2016-04-21 DIAGNOSIS — R000 Tachycardia, unspecified: Secondary | ICD-10-CM | POA: Diagnosis not present

## 2016-04-21 DIAGNOSIS — C342 Malignant neoplasm of middle lobe, bronchus or lung: Secondary | ICD-10-CM | POA: Diagnosis not present

## 2016-04-21 DIAGNOSIS — Z87891 Personal history of nicotine dependence: Secondary | ICD-10-CM

## 2016-04-21 DIAGNOSIS — C3491 Malignant neoplasm of unspecified part of right bronchus or lung: Secondary | ICD-10-CM | POA: Diagnosis not present

## 2016-04-21 DIAGNOSIS — R0602 Shortness of breath: Secondary | ICD-10-CM | POA: Diagnosis not present

## 2016-04-21 DIAGNOSIS — Z7952 Long term (current) use of systemic steroids: Secondary | ICD-10-CM

## 2016-04-21 DIAGNOSIS — G4733 Obstructive sleep apnea (adult) (pediatric): Secondary | ICD-10-CM | POA: Diagnosis present

## 2016-04-21 DIAGNOSIS — R918 Other nonspecific abnormal finding of lung field: Secondary | ICD-10-CM | POA: Diagnosis not present

## 2016-04-21 DIAGNOSIS — Z79891 Long term (current) use of opiate analgesic: Secondary | ICD-10-CM

## 2016-04-21 LAB — URINALYSIS, ROUTINE W REFLEX MICROSCOPIC
BILIRUBIN URINE: NEGATIVE
GLUCOSE, UA: 500 mg/dL — AB
HGB URINE DIPSTICK: NEGATIVE
KETONES UR: NEGATIVE mg/dL
LEUKOCYTES UA: NEGATIVE
Nitrite: NEGATIVE
PH: 5.5 (ref 5.0–8.0)
PROTEIN: NEGATIVE mg/dL
Specific Gravity, Urine: 1.01 (ref 1.005–1.030)

## 2016-04-21 LAB — HEPATIC FUNCTION PANEL
ALT: 18 U/L (ref 17–63)
AST: 16 U/L (ref 15–41)
Albumin: 3.5 g/dL (ref 3.5–5.0)
Alkaline Phosphatase: 77 U/L (ref 38–126)
BILIRUBIN DIRECT: 0.2 mg/dL (ref 0.1–0.5)
BILIRUBIN INDIRECT: 0.8 mg/dL (ref 0.3–0.9)
TOTAL PROTEIN: 6.3 g/dL — AB (ref 6.5–8.1)
Total Bilirubin: 1 mg/dL (ref 0.3–1.2)

## 2016-04-21 LAB — CBC WITH DIFFERENTIAL/PLATELET
BASOS PCT: 0 %
Basophils Absolute: 0 10*3/uL (ref 0.0–0.1)
EOS ABS: 0.1 10*3/uL (ref 0.0–0.7)
EOS PCT: 1 %
HCT: 40.3 % (ref 39.0–52.0)
HEMOGLOBIN: 13.1 g/dL (ref 13.0–17.0)
Lymphocytes Relative: 7 %
Lymphs Abs: 0.6 10*3/uL — ABNORMAL LOW (ref 0.7–4.0)
MCH: 29.8 pg (ref 26.0–34.0)
MCHC: 32.5 g/dL (ref 30.0–36.0)
MCV: 91.6 fL (ref 78.0–100.0)
MONOS PCT: 4 %
Monocytes Absolute: 0.3 10*3/uL (ref 0.1–1.0)
NEUTROS PCT: 87 %
Neutro Abs: 7 10*3/uL (ref 1.7–7.7)
PLATELETS: 103 10*3/uL — AB (ref 150–400)
RBC: 4.4 MIL/uL (ref 4.22–5.81)
RDW: 19.3 % — ABNORMAL HIGH (ref 11.5–15.5)
WBC: 8 10*3/uL (ref 4.0–10.5)

## 2016-04-21 LAB — BASIC METABOLIC PANEL
ANION GAP: 9 (ref 5–15)
BUN: 11 mg/dL (ref 6–20)
CALCIUM: 8.8 mg/dL — AB (ref 8.9–10.3)
CO2: 26 mmol/L (ref 22–32)
CREATININE: 0.82 mg/dL (ref 0.61–1.24)
Chloride: 98 mmol/L — ABNORMAL LOW (ref 101–111)
GFR calc Af Amer: >60 mL/min (ref >60)
GLUCOSE: 180 mg/dL — AB (ref 65–99)
Potassium: 3.5 mmol/L (ref 3.5–5.1)
Sodium: 133 mmol/L — ABNORMAL LOW (ref 135–145)

## 2016-04-21 LAB — GLUCOSE, CAPILLARY: GLUCOSE-CAPILLARY: 262 mg/dL — AB (ref 65–99)

## 2016-04-21 LAB — PROTIME-INR
INR: 1.14 (ref 0.00–1.49)
Prothrombin Time: 14.8 seconds (ref 11.6–15.2)

## 2016-04-21 LAB — I-STAT CG4 LACTIC ACID, ED: LACTIC ACID, VENOUS: 2.73 mmol/L — AB (ref 0.5–2.0)

## 2016-04-21 LAB — TROPONIN I

## 2016-04-21 LAB — LACTIC ACID, PLASMA: LACTIC ACID, VENOUS: 2.4 mmol/L — AB (ref 0.5–2.0)

## 2016-04-21 MED ORDER — THEOPHYLLINE ER 400 MG PO TB24
400.0000 mg | ORAL_TABLET | Freq: Every day | ORAL | Status: DC
  Administered 2016-04-22 – 2016-04-25 (×4): 400 mg via ORAL
  Filled 2016-04-21 (×4): qty 1

## 2016-04-21 MED ORDER — METOPROLOL SUCCINATE ER 25 MG PO TB24
12.5000 mg | ORAL_TABLET | Freq: Every day | ORAL | Status: DC
  Administered 2016-04-22 – 2016-04-25 (×4): 12.5 mg via ORAL
  Filled 2016-04-21 (×4): qty 1

## 2016-04-21 MED ORDER — GABAPENTIN 100 MG PO CAPS
100.0000 mg | ORAL_CAPSULE | Freq: Every day | ORAL | Status: DC
  Administered 2016-04-22 – 2016-04-25 (×4): 100 mg via ORAL
  Filled 2016-04-21 (×4): qty 1

## 2016-04-21 MED ORDER — CLOPIDOGREL BISULFATE 75 MG PO TABS
75.0000 mg | ORAL_TABLET | Freq: Every day | ORAL | Status: DC
  Administered 2016-04-22 – 2016-04-25 (×4): 75 mg via ORAL
  Filled 2016-04-21 (×4): qty 1

## 2016-04-21 MED ORDER — IPRATROPIUM-ALBUTEROL 0.5-2.5 (3) MG/3ML IN SOLN
3.0000 mL | Freq: Once | RESPIRATORY_TRACT | Status: AC
  Administered 2016-04-21: 3 mL via RESPIRATORY_TRACT
  Filled 2016-04-21: qty 3

## 2016-04-21 MED ORDER — MOMETASONE FURO-FORMOTEROL FUM 200-5 MCG/ACT IN AERO
2.0000 | INHALATION_SPRAY | Freq: Two times a day (BID) | RESPIRATORY_TRACT | Status: DC
  Administered 2016-04-21 – 2016-04-25 (×8): 2 via RESPIRATORY_TRACT
  Filled 2016-04-21: qty 8.8

## 2016-04-21 MED ORDER — ROFLUMILAST 500 MCG PO TABS
500.0000 ug | ORAL_TABLET | Freq: Every day | ORAL | Status: DC
  Administered 2016-04-22 – 2016-04-25 (×4): 500 ug via ORAL
  Filled 2016-04-21 (×4): qty 1

## 2016-04-21 MED ORDER — VANCOMYCIN HCL IN DEXTROSE 1-5 GM/200ML-% IV SOLN
1000.0000 mg | Freq: Three times a day (TID) | INTRAVENOUS | Status: DC
  Administered 2016-04-21 – 2016-04-24 (×8): 1000 mg via INTRAVENOUS
  Filled 2016-04-21 (×9): qty 200

## 2016-04-21 MED ORDER — ASPIRIN EC 81 MG PO TBEC
81.0000 mg | DELAYED_RELEASE_TABLET | Freq: Every day | ORAL | Status: DC
  Administered 2016-04-22 – 2016-04-25 (×4): 81 mg via ORAL
  Filled 2016-04-21 (×4): qty 1

## 2016-04-21 MED ORDER — CLONAZEPAM 1 MG PO TABS
2.0000 mg | ORAL_TABLET | Freq: Every evening | ORAL | Status: DC | PRN
  Administered 2016-04-21 – 2016-04-24 (×4): 2 mg via ORAL
  Filled 2016-04-21 (×4): qty 2

## 2016-04-21 MED ORDER — SODIUM CHLORIDE 0.9 % IV BOLUS (SEPSIS)
250.0000 mL | Freq: Once | INTRAVENOUS | Status: AC
  Administered 2016-04-21: 250 mL via INTRAVENOUS

## 2016-04-21 MED ORDER — INSULIN ASPART 100 UNIT/ML ~~LOC~~ SOLN
0.0000 [IU] | Freq: Three times a day (TID) | SUBCUTANEOUS | Status: DC
  Administered 2016-04-22: 8 [IU] via SUBCUTANEOUS

## 2016-04-21 MED ORDER — ALBUTEROL SULFATE (2.5 MG/3ML) 0.083% IN NEBU
5.0000 mg | INHALATION_SOLUTION | Freq: Once | RESPIRATORY_TRACT | Status: AC
  Administered 2016-04-21: 5 mg via RESPIRATORY_TRACT
  Filled 2016-04-21: qty 6

## 2016-04-21 MED ORDER — TIOTROPIUM BROMIDE MONOHYDRATE 18 MCG IN CAPS
18.0000 ug | ORAL_CAPSULE | Freq: Every day | RESPIRATORY_TRACT | Status: DC
Start: 2016-04-22 — End: 2016-04-25
  Administered 2016-04-22 – 2016-04-25 (×4): 18 ug via RESPIRATORY_TRACT
  Filled 2016-04-21: qty 5

## 2016-04-21 MED ORDER — MORPHINE SULFATE 15 MG PO TABS
15.0000 mg | ORAL_TABLET | ORAL | Status: DC | PRN
  Administered 2016-04-23 – 2016-04-24 (×2): 15 mg via ORAL
  Filled 2016-04-21 (×2): qty 1

## 2016-04-21 MED ORDER — SUVOREXANT 20 MG PO TABS: 1.0000 | ORAL_TABLET | Freq: Every evening | ORAL | Status: DC | PRN

## 2016-04-21 MED ORDER — DEXTROSE 5 % IV SOLN
1.0000 g | Freq: Three times a day (TID) | INTRAVENOUS | Status: DC
  Administered 2016-04-21 – 2016-04-25 (×12): 1 g via INTRAVENOUS
  Filled 2016-04-21 (×13): qty 1

## 2016-04-21 MED ORDER — METHOCARBAMOL 500 MG PO TABS: 500.0000 mg | ORAL_TABLET | Freq: Two times a day (BID) | ORAL | Status: DC | PRN

## 2016-04-21 MED ORDER — PROCHLORPERAZINE MALEATE 10 MG PO TABS: 10.0000 mg | ORAL_TABLET | Freq: Four times a day (QID) | ORAL | Status: DC | PRN

## 2016-04-21 MED ORDER — METHYLPREDNISOLONE SODIUM SUCC 125 MG IJ SOLR
125.0000 mg | Freq: Once | INTRAMUSCULAR | Status: AC
  Administered 2016-04-21: 125 mg via INTRAVENOUS
  Filled 2016-04-21: qty 2

## 2016-04-21 MED ORDER — ARMODAFINIL 250 MG PO TABS: 250.0000 mg | ORAL_TABLET | Freq: Every morning | ORAL | Status: DC

## 2016-04-21 MED ORDER — PREDNISONE 5 MG PO TABS
5.0000 mg | ORAL_TABLET | Freq: Every day | ORAL | Status: DC
  Administered 2016-04-22 – 2016-04-25 (×4): 5 mg via ORAL
  Filled 2016-04-21 (×4): qty 1

## 2016-04-21 MED ORDER — POTASSIUM CHLORIDE CRYS ER 20 MEQ PO TBCR
20.0000 meq | EXTENDED_RELEASE_TABLET | Freq: Every morning | ORAL | Status: DC
  Administered 2016-04-22 – 2016-04-25 (×4): 20 meq via ORAL
  Filled 2016-04-21 (×4): qty 1

## 2016-04-21 MED ORDER — ONDANSETRON HCL 4 MG/2ML IJ SOLN
4.0000 mg | Freq: Four times a day (QID) | INTRAMUSCULAR | Status: DC | PRN
  Administered 2016-04-21 – 2016-04-22 (×2): 4 mg via INTRAVENOUS
  Filled 2016-04-21 (×2): qty 2

## 2016-04-21 MED ORDER — FLUOXETINE HCL 20 MG PO CAPS
40.0000 mg | ORAL_CAPSULE | Freq: Every morning | ORAL | Status: DC
  Administered 2016-04-22 – 2016-04-25 (×4): 40 mg via ORAL
  Filled 2016-04-21 (×4): qty 2

## 2016-04-21 MED ORDER — ISOSORBIDE MONONITRATE ER 60 MG PO TB24
60.0000 mg | ORAL_TABLET | Freq: Every day | ORAL | Status: DC
  Administered 2016-04-22 – 2016-04-25 (×4): 60 mg via ORAL
  Filled 2016-04-21 (×4): qty 1

## 2016-04-21 MED ORDER — DEXTROSE 5 % IV SOLN
1.0000 g | Freq: Once | INTRAVENOUS | Status: AC
  Administered 2016-04-21: 1 g via INTRAVENOUS
  Filled 2016-04-21: qty 1

## 2016-04-21 MED ORDER — TOBRAMYCIN-DEXAMETHASONE 0.3-0.1 % OP SUSP
2.0000 [drp] | Freq: Four times a day (QID) | OPHTHALMIC | Status: DC
  Administered 2016-04-21 – 2016-04-25 (×15): 2 [drp] via OPHTHALMIC
  Filled 2016-04-21: qty 2.5

## 2016-04-21 MED ORDER — SODIUM CHLORIDE 0.9 % IV BOLUS (SEPSIS)
1682.0000 mL | Freq: Once | INTRAVENOUS | Status: AC
  Administered 2016-04-21: 1682 mL via INTRAVENOUS

## 2016-04-21 MED ORDER — SODIUM CHLORIDE 0.9 % IV BOLUS (SEPSIS)
1000.0000 mL | Freq: Once | INTRAVENOUS | Status: AC
  Administered 2016-04-21: 1000 mL via INTRAVENOUS

## 2016-04-21 MED ORDER — PANTOPRAZOLE SODIUM 40 MG PO TBEC
40.0000 mg | DELAYED_RELEASE_TABLET | Freq: Every day | ORAL | Status: DC
  Administered 2016-04-22 – 2016-04-25 (×4): 40 mg via ORAL
  Filled 2016-04-21 (×4): qty 1

## 2016-04-21 MED ORDER — MORPHINE SULFATE ER 30 MG PO TBCR
30.0000 mg | EXTENDED_RELEASE_TABLET | Freq: Two times a day (BID) | ORAL | Status: DC
Start: 2016-04-21 — End: 2016-04-25
  Administered 2016-04-21 – 2016-04-25 (×8): 30 mg via ORAL
  Filled 2016-04-21 (×8): qty 1

## 2016-04-21 MED ORDER — NITROGLYCERIN 0.4 MG SL SUBL: 0.4000 mg | SUBLINGUAL_TABLET | SUBLINGUAL | Status: DC | PRN

## 2016-04-21 MED ORDER — SODIUM CHLORIDE 0.9 % IV SOLN
INTRAVENOUS | Status: DC
  Administered 2016-04-21: 19:00:00 via INTRAVENOUS

## 2016-04-21 MED ORDER — ARIPIPRAZOLE 5 MG PO TABS
5.0000 mg | ORAL_TABLET | Freq: Every day | ORAL | Status: DC
  Administered 2016-04-21 – 2016-04-23 (×3): 5 mg via ORAL
  Filled 2016-04-21 (×6): qty 1

## 2016-04-21 MED ORDER — ALBUTEROL SULFATE (2.5 MG/3ML) 0.083% IN NEBU
2.5000 mg | INHALATION_SOLUTION | RESPIRATORY_TRACT | Status: DC | PRN
  Administered 2016-04-22 – 2016-04-24 (×6): 2.5 mg via RESPIRATORY_TRACT
  Filled 2016-04-21 (×6): qty 3

## 2016-04-21 MED ORDER — DM-GUAIFENESIN ER 30-600 MG PO TB12: 1.0000 | ORAL_TABLET | Freq: Two times a day (BID) | ORAL | Status: DC | PRN

## 2016-04-21 MED ORDER — VANCOMYCIN HCL IN DEXTROSE 1-5 GM/200ML-% IV SOLN
1000.0000 mg | Freq: Once | INTRAVENOUS | Status: AC
  Administered 2016-04-21: 1000 mg via INTRAVENOUS
  Filled 2016-04-21: qty 200

## 2016-04-21 MED ORDER — ATORVASTATIN CALCIUM 20 MG PO TABS
20.0000 mg | ORAL_TABLET | Freq: Every day | ORAL | Status: DC
  Administered 2016-04-22 – 2016-04-25 (×4): 20 mg via ORAL
  Filled 2016-04-21 (×4): qty 1

## 2016-04-21 MED ORDER — MIRTAZAPINE 15 MG PO TABS
45.0000 mg | ORAL_TABLET | Freq: Every day | ORAL | Status: DC
  Administered 2016-04-21 – 2016-04-24 (×4): 45 mg via ORAL
  Filled 2016-04-21 (×4): qty 3

## 2016-04-21 NOTE — ED Notes (Signed)
Per pt, states he had chemo on the 23 rd and has been "sick" since-states headache, sore throat and increasingly SOB

## 2016-04-21 NOTE — ED Provider Notes (Signed)
CSN: [id number]     Arrival date & time 04/21/16  1237 History   First MD Initiated Contact with Patient 04/21/16 1254     Chief Complaint  Patient presents with  . Shortness of Breath     Patient is a 60 y.o. male presenting with shortness of breath. The history is provided by the patient. No language interpreter was used.  Shortness of Breath  STARLIN STEIB is a 60 y.o. male who presents to the Emergency Department complaining of SOB.  He has a hx/o metastatic lung cancer currently on home O2, last received chemo five days ago.  He reports increased SOB, cough productive of green sputum for the last 3-4 days.  He has chills at home.  No reported fever, abdominal pain, leg swelling or pain.  Sxs are moderate, constant, worsening.    Past Medical History  Diagnosis Date  . CAD (coronary artery disease)     Left Main 30% stenosis, LAD 20 - 30 % stenosis, first and second diagonal branchesat 40 - 50%  stenosis with small arteries, circumflex had 30% stenosis in the large obtuse marginal, RCA at 70 - 80%  stenosis [not felt to be occlusive after evaluation with flow wire], distal 50 - 60% stenosis - [name][  . COPD (chronic obstructive pulmonary disease) (HCC)     Dr. Baird Lyons  . Depression   . Anxiety   . Hyperlipidemia   . Chronic insomnia   . Gout   . GERD (gastroesophageal reflux disease)   . PVD (peripheral vascular disease) (HCC)     PTA/Stent right common iliac  . DDD (degenerative disc disease)   . Hx of colonoscopy   . 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 OSA (obstructive sleep apnea)     NPSG 09/10/10- AHI 11.3/hr  . Hypertension     dr Percival Spanish  . History of radiation therapy 11/10/13- 12/29/13    left lung 6600 cGy in 33 sessions  . Diabetes mellitus without complication ([place]) 5/36/6440  . Cancer ([place])     lung  . Lung cancer ([place]) 10/04/13    LUL squamous cell lung cancer  . History of cardiovascular stress test     Myoview 7/16:  Diaphragmatic  attenuation, no ischemia, EF 56%; Low Risk  . Itching due to drug 03/19/2016  . Nausea with vomiting 03/26/2016   Past Surgical History  Procedure Laterality Date  . Spinal fusion  03/05/2007    L4-L5  . Hip surgery      left 'bone graft taken"  . Arm surgery      left elbow  . Shoulder surgery      right  . C-spine surgery    . Angioplasty    . Video bronchoscopy with endobronchial navigation N/A 10/04/2013    Procedure: VIDEO BRONCHOSCOPY WITH ENDOBRONCHIAL NAVIGATION;  Surgeon: Collene Gobble, MD;  Location: [facility];  Service: Thoracic;  Laterality: N/A;  . Back surgery      lower  . Anterior fusion cervical spine      cervical fusion  7 yrs ago ([facility])  . Colon surgery      '11 "Diverticulitis"  . Colonoscopy with propofol N/A 05/22/2015    Procedure: COLONOSCOPY WITH PROPOFOL;  Surgeon: Inda Castle, MD;  Location: [facility];  Service: Endoscopy;  Laterality: N/A;  . Peripheral vascular catheterization N/A 08/10/2015    Procedure: Lower Extremity Angiography;  Surgeon: Lorretta Harp, MD; Distal Ao OK, L-EIA stent OK, R-CIA 100%  s/p overlapping 7 mm x 38 mm ICast stents, 50-60% R-CFA       Family History  Problem Relation Age of Onset  . Heart attack Mother   . Heart attack Sister   . Lung cancer Sister   . Cancer Sister     small cell lung, mets to brain  . Emphysema Sister   . Hypertension Brother   . Stroke Neg Hx    Social History  Substance Use Topics  . Smoking status: Former Smoker -- 0.50 packs/day for 43 years    Types: Cigarettes    Quit date: 09/23/2013  . Smokeless tobacco: Never Used     Comment: history of 3 PPD, 11/02/13   . Alcohol Use: No    Review of Systems  Respiratory: Positive for shortness of breath.   All other systems reviewed and are negative.     Allergies  Carboplatin  Home Medications   Prior to Admission medications   Medication Sig Start Date End Date Taking? Authorizing Provider  ADVAIR DISKUS 500-50 MCG/DOSE AEPB 1  PUFF THEN RINSE MOUTH, TWICE DAILY MAINTENANCE 02/01/16  Yes Deneise Lever, MD  albuterol (PROVENTIL) (2.5 MG/3ML) 0.083% nebulizer solution INHALE 1 VIAL IN NEBULIZER EVERY 4 HOURS AS NEEDED FOR WHEEZING OR SHORTNESS OF BREATH 04/04/16  Yes Deneise Lever, MD  albuterol (VENTOLIN HFA) 108 (90 Base) MCG/ACT inhaler INHALE 2 PUFFS INTO THE LUNGS 4 TIMES DAILY AS NEEDED FOR WHEEZING Patient taking differently: Inhale 2 puffs into the lungs every 4 (four) hours as needed for wheezing. INHALE 2 PUFFS INTO THE LUNGS 4 TIMES DAILY AS NEEDED FOR WHEEZING 02/14/16  Yes Deneise Lever, MD  ARIPiprazole (ABILIFY) 5 MG tablet Take 1 tablet (5 mg total) by mouth at bedtime. 03/30/13  Yes Doe-Hyun R Shawna Orleans, DO  Armodafinil 250 MG tablet Take 250 mg by mouth every morning. 03/08/16  Yes Historical Provider, MD  aspirin 81 MG EC tablet TAKE 1 TABLET BY MOUTH EVERY DAY 01/04/16  Yes Janith Lima, MD  atorvastatin (LIPITOR) 20 MG tablet TAKE 1 TABLET EVERY DAY Patient taking differently: TAKE 1 TABLET EVERY DAY IN THE EVENING 08/01/15  Yes Dorena Cookey, MD  BELSOMRA 20 MG TABS Take 1 tablet by mouth at bedtime as needed (for sleep). Reported on 03/29/2016 12/26/15  Yes Historical Provider, MD  clonazePAM (KLONOPIN) 1 MG tablet Take 2 mg by mouth at bedtime as needed for anxiety. Reported on 01/16/2016   Yes Historical Provider, MD  clopidogrel (PLAVIX) 75 MG tablet Take 1 tablet (75 mg total) by mouth daily. 08/11/15  Yes Almyra Deforest, PA  DALIRESP 500 MCG TABS tablet Take 500 mcg by mouth daily.  03/28/16  Yes Historical Provider, MD  dextromethorphan-guaiFENesin (MUCINEX DM) 30-600 MG 12hr tablet Take 1 tablet by mouth 2 (two) times daily as needed for cough (congestion).   Yes Historical Provider, MD  esomeprazole (NEXIUM) 40 MG capsule Take 40 mg by mouth daily. 01/03/16  Yes Historical Provider, MD  FLUoxetine (PROZAC) 40 MG capsule Take 40 mg by mouth every morning.   Yes Historical Provider, MD  gabapentin (NEURONTIN) 100 MG  capsule Take 100 mg by mouth daily.  07/15/15  Yes Historical Provider, MD  isosorbide mononitrate (IMDUR) 60 MG 24 hr tablet Take 60 mg by mouth daily. 12/06/15  Yes Historical Provider, MD  methocarbamol (ROBAXIN) 500 MG tablet Take 500 mg by mouth 2 (two) times daily as needed for muscle spasms. Reported on 01/16/2016 10/27/13  Yes  Historical Provider, MD  metoprolol succinate (TOPROL-XL) 25 MG 24 hr tablet TAKE 1/2 TABLET BY MOUTH DAILY 03/27/16  Yes Minus Breeding, MD  mirtazapine (REMERON) 45 MG tablet Take 1 tablet (45 mg total) by mouth at bedtime. 12/29/14  Yes Tammy S Parrett, NP  morphine (MS CONTIN) 30 MG 12 hr tablet Take 1 tablet (30 mg total) by mouth every 12 (twelve) hours. 04/16/16  Yes Janith Lima, MD  morphine (MSIR) 15 MG tablet Take 1 tablet (15 mg total) by mouth every 4 (four) hours as needed for severe pain. 04/16/16  Yes Janith Lima, MD  nitroGLYCERIN (NITROSTAT) 0.4 MG SL tablet Place 1 tablet (0.4 mg total) under the tongue every 5 (five) minutes as needed for chest pain. 08/09/14  Yes Minus Breeding, MD  ondansetron (ZOFRAN) 8 MG tablet TAKE 1 TABLET (8 MG TOTAL) BY MOUTH EVERY 8 (EIGHT) HOURS AS NEEDED FOR NAUSEA OR VOMITING. 04/11/16  Yes Curt Bears, MD  pantoprazole (PROTONIX) 40 MG tablet Take 1 tablet (40 mg total) by mouth daily. 04/05/16  Yes Aleksei Plotnikov V, MD  potassium chloride SA (K-DUR,KLOR-CON) 20 MEQ tablet Take 1 tablet (20 mEq total) by mouth every morning. Reported on 12/07/2015 12/12/15  Yes Janith Lima, MD  predniSONE (DELTASONE) 5 MG tablet Take 5 mg by mouth daily. 01/12/16  Yes Historical Provider, MD  prochlorperazine (COMPAZINE) 10 MG tablet TAKE 1 TABLET EVERY 6 HOURS AS NEEDED FOR NAUSEA AND VOMITING 04/11/16  Yes Curt Bears, MD  theophylline (UNIPHYL) 400 MG 24 hr tablet TAKE 1 TABLET EVERY DAY 04/01/16  Yes Deneise Lever, MD  Tiotropium Bromide Monohydrate (SPIRIVA RESPIMAT) 1.25 MCG/ACT AERS Inhale 2 puffs into the lungs daily. 04/16/16   Yes Janith Lima, MD  tobramycin-dexamethasone [facility]) ophthalmic solution Place 2 drops into both eyes every 6 (six) hours. 04/16/16  Yes Janith Lima, MD  OXYGEN Inhale 2 L into the lungs at bedtime as needed.    Historical Provider, MD   BP 147/68 mmHg  Pulse 101  Temp(Src) 98.5 F (36.9 C)  Resp 23  Ht '5\' 6"'$  (1.676 m)  Wt 204 lb (92.534 kg)  BMI 32.94 kg/m2  SpO2 95% Physical Exam  Constitutional: He is oriented to person, place, and time. He appears well-developed and well-nourished.  HENT:  Head: Normocephalic and atraumatic.  Cardiovascular: Regular rhythm.   No murmur heard. tachycardic  Pulmonary/Chest: Effort normal. No respiratory distress.  Decreased air movement bilaterally with occasional end expiratory wheezes.    Abdominal: Soft. There is no tenderness. There is no rebound and no guarding.  Musculoskeletal: He exhibits no edema or tenderness.  Neurological: He is alert and oriented to person, place, and time.  Skin: Skin is warm and dry.  Psychiatric: He has a normal mood and affect. His behavior is normal.  Nursing note and vitals reviewed.   ED Course  Procedures (including critical care time) Labs Review Labs Reviewed  CBC WITH DIFFERENTIAL/PLATELET - Abnormal; Notable for the following:    RDW 19.3 (*)    Platelets 103 (*)    Lymphs Abs 0.6 (*)    All other components within normal limits  BASIC METABOLIC PANEL - Abnormal; Notable for the following:    Sodium 133 (*)    Chloride 98 (*)    Glucose, Bld 180 (*)    Calcium 8.8 (*)    All other components within normal limits  I-STAT CG4 LACTIC ACID, ED - Abnormal; Notable for the following:  Lactic Acid, Venous 2.73 (*)    All other components within normal limits  CULTURE, BLOOD (ROUTINE X 2)  CULTURE, BLOOD (ROUTINE X 2)  URINE CULTURE  URINALYSIS, ROUTINE W REFLEX MICROSCOPIC (NOT AT [facility])    Imaging Review Dg Chest 2 View  04/21/2016  CLINICAL DATA:  60 year old male with increasing  shortness of breath and sore throat. History of lung cancer, undergoing chemotherapy. If EXAM: CHEST  2 VIEW COMPARISON:  Chest x-ray 04/16/2016. FINDINGS: Again noted is a pulmonary mass in the left lower lobe, estimated to measure approximately 3.0 x 3.3 x 3.0 cm. There is some chronic scarring and volume loss right middle lobe, as evidenced by downward displacement of the minor fissure. Some ill-defined opacity in the right middle lobe is also noted, which may suggest some developing airspace consolidation. Small right and trace left pleural effusions. No evidence of pulmonary edema. Chronic pleuroparenchymal thickening in the apex of the left hemithorax, very similar to prior examination. Within this area there appears to be a 2.6 cm cavity, also similar to priors. Heart size is normal. The patient is rotated to the left on today's exam, resulting in distortion of the mediastinal contours and reduced diagnostic sensitivity and specificity for mediastinal pathology. Orthopedic fixation hardware in the lower cervical spine incidentally noted. IMPRESSION: 1. Worsening atelectasis and airspace consolidation in the right middle lobe, concerning for developing right middle lobe pneumonia. 2. Right lower lobe pulmonary mass, similar to recent prior examinations, but more prominent than remote prior studies, suggesting progression of disease. 3. Small right and trace left pleural effusions. 4. Chronic scarring and cavitation in the left apex again noted. Electronically Signed   By: Vinnie Langton M.D.   On: 04/21/2016 13:19   I have personally reviewed and evaluated these images and lab results as part of my medical decision-making.   EKG Interpretation   Date/Time:  Sunday Apr 21 2016 12:47:37 EDT Ventricular Rate:  100 PR Interval:  137 QRS Duration: 84 QT Interval:  313 QTC Calculation: 404 R Axis:   91 Text Interpretation:  Sinus tachycardia Atrial premature complex Right  axis deviation Confirmed  by Hazle Coca [phone]) on 04/21/2016 12:54:39 PM  Also confirmed by Hazle Coca [phone]), editor [place] CT, Leda Gauze [phone])  on  04/21/2016 2:47:00 PM      MDM   Final diagnoses:  HCAP (healthcare-associated pneumonia)    Pt with hx/o lung cancer here with increased SOB, cough productive of green sputum, chills.  CXR concerning for postobstructive pna.  Lactic acid is mildly elevated in the department.  Treating with IV abx for potential HCAP.  Pt feels improved on repeat eval, has improved air movement bilaterally with crackles in bilateral bases.  Plan to admit for further treatment.      Quintella Reichert, MD 04/21/16 [phone]

## 2016-04-21 NOTE — ED Notes (Signed)
Dr. Ralene Bathe made aware of pts lac of 2.73

## 2016-04-21 NOTE — Progress Notes (Addendum)
Pharmacy Antibiotic Note  Ernest [name] is a 60 y.o. male with metastatic non-small cell lung cancer currently being treated with Abraxane.  He was recently diagnosed with PNA with CXR on 5/06 and treated with levaquin x6 days. Repeat CXR on 5/23 showed improvement of RLL PNA.  He presented to the ED on 04/21/2016 with c/o SOB. To start abx for PNA.   5/28 CXR: Worsening atelectasis and airspace consolidation in the right middle lobe, concerning for developing right middle lobe pneumonia. RLL mass, small BL pleural effusions - afeb, wbc wnl,  - LA 2.73 on 5/28   Plan: - cefepime 1gm Iv x1 per MD - vancomycin 1 gm IV x1 now.  Will f/u with pending BMP and enter maintenance dose for patient based on renal function.  Adden: scr now back 0.82 (crcl~100). Will start vancomycin 1gm IV q8h.  __________________________  Temp (24hrs), Avg:98.5 F (36.9 C), Min:98.5 F (36.9 C), Max:98.5 F (36.9 C)   Recent Labs Lab 04/16/16 1253 04/21/16 1348 04/21/16 1402  WBC 10.4* 8.0  --   CREATININE 0.8  --   --   LATICACIDVEN  --   --  2.73*    Estimated Creatinine Clearance: 101.5 mL/min (by C-G formula based on Cr of 0.8).    Allergies  Allergen Reactions  . Carboplatin Shortness Of Breath, Swelling and Rash    Swelling of lips, rash on face,eyes and head     Thank you for allowing pharmacy to be a part of this patient's care.  Lynelle Doctor 04/21/2016 2:09 PM

## 2016-04-21 NOTE — ED Notes (Addendum)
Pt placed on 3 L O2. Pt is on 3L at home at all times d/t COPD and Lung Ca. Pt received IV chemo 5 days ago

## 2016-04-21 NOTE — H&P (Signed)
History and Physical    Ernest [name] PNT:[id number] DOB: Oct 21, 1956 DOA: 04/21/2016  PCP: [name], MD  Oncology: Dr. Inda Merlin  Patient coming from: Home  Chief Complaint: Productive cough and shortness of breath  HPI: Ernest [name] is a 60 y.o. gentleman with a history of NSCLC (first diagnosed in 2014, recurred in February of this year), CAD, COPD, PVD, and DM.  He is currently undergoing palliative chemotherapy with Abraxane, last dose on Tuesday.  He feels that he has been sick since then.  He has had cough productive of green sputum, sometimes blood tinged.  He also has green nasal drainage.  He has had increased headache and sore throat.  He has had chills and night sweats.  Temperature 99.5.  He has had nausea, which is typical after chemotherapy, but no vomiting.  He has had light-headedness but no syncope.  He wears oxygen 3L [place] at baseline, and this has been stable.  He has had a right-sided chest pain associated with shortness of breath.  No edema.  He has had worsening acid reflux.  ED Course: ED evaluation concerning for chest xray showing increased infiltrate in the RML.  The patient also has an elevated lactic acid level with tachycardia and tachypnea, though no leukocytosis or documented fever.  He is immunocompromised and high risk.  Hospitalist asked to admit for sepsis secondary to post-obstructive HCAP.  Review of Systems: As per HPI otherwise 10 point review of systems negative.    Past Medical History  Diagnosis Date  . CAD (coronary artery disease)     Left Main 30% stenosis, LAD 20 - 30 % stenosis, first and second diagonal branchesat 40 - 50%  stenosis with small arteries, circumflex had 30% stenosis in the large obtuse marginal, RCA at 70 - 80%  stenosis [not felt to be occlusive after evaluation with flow wire], distal 50 - 60% stenosis - [name][  . COPD (chronic obstructive pulmonary disease) (HCC)     Dr. Baird Lyons  . Depression   .  Anxiety   . Hyperlipidemia   . Chronic insomnia   . Gout   . GERD (gastroesophageal reflux disease)   . PVD (peripheral vascular disease) (HCC)     PTA/Stent right common iliac  . DDD (degenerative disc disease)   . Hx of colonoscopy   . COPD with asthma ([place]) 09/08/2007  . OSA (obstructive sleep apnea)     NPSG 09/10/10- AHI 11.3/hr  . Hypertension     dr Percival Spanish  . History of radiation therapy 11/10/13- 12/29/13    left lung 6600 cGy in 33 sessions  . Diabetes mellitus without complication ([place]) 0/86/7619  . Cancer ([place])     lung  . Lung cancer ([place]) 10/04/13    LUL squamous cell lung cancer  . History of cardiovascular stress test     Myoview 7/16:  Diaphragmatic attenuation, no ischemia, EF 56%; Low Risk  . Itching due to drug 03/19/2016  . Nausea with vomiting 03/26/2016    Past Surgical History  Procedure Laterality Date  . Spinal fusion  03/05/2007    L4-L5  . Hip surgery      left 'bone graft taken"  . Arm surgery      left elbow  . Shoulder surgery      right  . C-spine surgery    . Angioplasty    . Video bronchoscopy with endobronchial navigation N/A 10/04/2013    Procedure: VIDEO BRONCHOSCOPY WITH ENDOBRONCHIAL NAVIGATION;  Surgeon:  Collene Gobble, MD;  Location: [facility];  Service: Thoracic;  Laterality: N/A;  . Back surgery      lower  . Anterior fusion cervical spine      cervical fusion  7 yrs ago ([facility])  . Colon surgery      '11 "Diverticulitis"  . Colonoscopy with propofol N/A 05/22/2015    Procedure: COLONOSCOPY WITH PROPOFOL;  Surgeon: Inda Castle, MD;  Location: [facility];  Service: Endoscopy;  Laterality: N/A;  . Peripheral vascular catheterization N/A 08/10/2015    Procedure: Lower Extremity Angiography;  Surgeon: Lorretta Harp, MD; Distal Ao OK, L-EIA stent OK, R-CIA 100% s/p overlapping 7 mm x 38 mm ICast stents, 50-60% R-CFA         reports that he quit smoking about 2 years ago. His smoking use included Cigarettes. He has a 21.5 pack-year  smoking history. He has never used smokeless tobacco. He reports that he does not drink alcohol or use illicit drugs.  Allergies  Allergen Reactions  . Carboplatin Shortness Of Breath, Swelling and Rash    Swelling of lips, rash on face,eyes and head    Family History  Problem Relation Age of Onset  . Heart attack Mother   . Heart attack Sister   . Lung cancer Sister   . Cancer Sister     small cell lung, mets to brain  . Emphysema Sister   . Hypertension Brother   . Stroke Neg Hx    Prior to Admission medications   Medication Sig Start Date End Date Taking? Authorizing Provider  ADVAIR DISKUS 500-50 MCG/DOSE AEPB 1 PUFF THEN RINSE MOUTH, TWICE DAILY MAINTENANCE 02/01/16  Yes Deneise Lever, MD  albuterol (PROVENTIL) (2.5 MG/3ML) 0.083% nebulizer solution INHALE 1 VIAL IN NEBULIZER EVERY 4 HOURS AS NEEDED FOR WHEEZING OR SHORTNESS OF BREATH 04/04/16  Yes Deneise Lever, MD  albuterol (VENTOLIN HFA) 108 (90 Base) MCG/ACT inhaler INHALE 2 PUFFS INTO THE LUNGS 4 TIMES DAILY AS NEEDED FOR WHEEZING Patient taking differently: Inhale 2 puffs into the lungs every 4 (four) hours as needed for wheezing. INHALE 2 PUFFS INTO THE LUNGS 4 TIMES DAILY AS NEEDED FOR WHEEZING 02/14/16  Yes Deneise Lever, MD  ARIPiprazole (ABILIFY) 5 MG tablet Take 1 tablet (5 mg total) by mouth at bedtime. 03/30/13  Yes Doe-Hyun R Shawna Orleans, DO  Armodafinil 250 MG tablet Take 250 mg by mouth every morning. 03/08/16  Yes Historical Provider, MD  aspirin 81 MG EC tablet TAKE 1 TABLET BY MOUTH EVERY DAY 01/04/16  Yes Janith Lima, MD  atorvastatin (LIPITOR) 20 MG tablet TAKE 1 TABLET EVERY DAY Patient taking differently: TAKE 1 TABLET EVERY DAY IN THE EVENING 08/01/15  Yes Dorena Cookey, MD  BELSOMRA 20 MG TABS Take 1 tablet by mouth at bedtime as needed (for sleep). Reported on 03/29/2016 12/26/15  Yes Historical Provider, MD  clonazePAM (KLONOPIN) 1 MG tablet Take 2 mg by mouth at bedtime as needed for anxiety. Reported on  01/16/2016   Yes Historical Provider, MD  clopidogrel (PLAVIX) 75 MG tablet Take 1 tablet (75 mg total) by mouth daily. 08/11/15  Yes Almyra Deforest, PA  DALIRESP 500 MCG TABS tablet Take 500 mcg by mouth daily.  03/28/16  Yes Historical Provider, MD  dextromethorphan-guaiFENesin (MUCINEX DM) 30-600 MG 12hr tablet Take 1 tablet by mouth 2 (two) times daily as needed for cough (congestion).   Yes Historical Provider, MD  esomeprazole (NEXIUM) 40 MG capsule Take 40  mg by mouth daily. 01/03/16  Yes Historical Provider, MD  FLUoxetine (PROZAC) 40 MG capsule Take 40 mg by mouth every morning.   Yes Historical Provider, MD  gabapentin (NEURONTIN) 100 MG capsule Take 100 mg by mouth daily.  07/15/15  Yes Historical Provider, MD  isosorbide mononitrate (IMDUR) 60 MG 24 hr tablet Take 60 mg by mouth daily. 12/06/15  Yes Historical Provider, MD  methocarbamol (ROBAXIN) 500 MG tablet Take 500 mg by mouth 2 (two) times daily as needed for muscle spasms. Reported on 01/16/2016 10/27/13  Yes Historical Provider, MD  metoprolol succinate (TOPROL-XL) 25 MG 24 hr tablet TAKE 1/2 TABLET BY MOUTH DAILY 03/27/16  Yes Minus Breeding, MD  mirtazapine (REMERON) 45 MG tablet Take 1 tablet (45 mg total) by mouth at bedtime. 12/29/14  Yes Tammy S Parrett, NP  morphine (MS CONTIN) 30 MG 12 hr tablet Take 1 tablet (30 mg total) by mouth every 12 (twelve) hours. 04/16/16  Yes Janith Lima, MD  morphine (MSIR) 15 MG tablet Take 1 tablet (15 mg total) by mouth every 4 (four) hours as needed for severe pain. 04/16/16  Yes Janith Lima, MD  nitroGLYCERIN (NITROSTAT) 0.4 MG SL tablet Place 1 tablet (0.4 mg total) under the tongue every 5 (five) minutes as needed for chest pain. 08/09/14  Yes Minus Breeding, MD  ondansetron (ZOFRAN) 8 MG tablet TAKE 1 TABLET (8 MG TOTAL) BY MOUTH EVERY 8 (EIGHT) HOURS AS NEEDED FOR NAUSEA OR VOMITING. 04/11/16  Yes Curt Bears, MD  pantoprazole (PROTONIX) 40 MG tablet Take 1 tablet (40 mg total) by mouth daily.  04/05/16  Yes Aleksei Plotnikov V, MD  potassium chloride SA (K-DUR,KLOR-CON) 20 MEQ tablet Take 1 tablet (20 mEq total) by mouth every morning. Reported on 12/07/2015 12/12/15  Yes Janith Lima, MD  predniSONE (DELTASONE) 5 MG tablet Take 5 mg by mouth daily. 01/12/16  Yes Historical Provider, MD  prochlorperazine (COMPAZINE) 10 MG tablet TAKE 1 TABLET EVERY 6 HOURS AS NEEDED FOR NAUSEA AND VOMITING 04/11/16  Yes Curt Bears, MD  theophylline (UNIPHYL) 400 MG 24 hr tablet TAKE 1 TABLET EVERY DAY 04/01/16  Yes Deneise Lever, MD  Tiotropium Bromide Monohydrate (SPIRIVA RESPIMAT) 1.25 MCG/ACT AERS Inhale 2 puffs into the lungs daily. 04/16/16  Yes Janith Lima, MD  tobramycin-dexamethasone [facility]) ophthalmic solution Place 2 drops into both eyes every 6 (six) hours. 04/16/16  Yes Janith Lima, MD  OXYGEN Inhale 2 L into the lungs at bedtime as needed.    Historical Provider, MD    Physical Exam: Filed Vitals:   04/21/16 1248 04/21/16 1430 04/21/16 1607 04/21/16 1717  BP: 141/78 147/68  127/54  Pulse: 101   100  Temp: 98.5 F (36.9 C)   98.5 F (36.9 C)  TempSrc:    Oral  Resp: '27 23  22  '$ Height:   '5\' 6"'$  (1.676 m) '5\' 7"'$  (1.702 m)  Weight:   92.534 kg (204 lb) 89.495 kg (197 lb 4.8 oz)  SpO2: 92% 95%  100%      Constitutional: NAD, calm, ill appearing  Filed Vitals:   04/21/16 1248 04/21/16 1430 04/21/16 1607 04/21/16 1717  BP: 141/78 147/68  127/54  Pulse: 101   100  Temp: 98.5 F (36.9 C)   98.5 F (36.9 C)  TempSrc:    Oral  Resp: '27 23  22  '$ Height:   '5\' 6"'$  (1.676 m) '5\' 7"'$  (1.702 m)  Weight:   92.534 kg (204  lb) 89.495 kg (197 lb 4.8 oz)  SpO2: 92% 95%  100%   Eyes: PERRL, lids and conjunctivae normal ENMT: Mucous membranes are slightly dry. Posterior pharynx clear of any exudate or lesions.Normal dentition.  Neck: normal, supple Respiratory: Ronchi on the right.  Intermittent wheeze.  No use of accessory muscles. Cardiovascular: Mildly tachycardic but regular.  No  murmur.  No pitting edema. Abdomen: Protuberant but soft and compressible.  Bowel sounds are present. Musculoskeletal: no clubbing / cyanosis. No joint deformity upper and lower extremities. Good ROM, no contractures. Normal muscle tone.  Skin: Very dry, erythematous papules present to RLE Neurologic: CN 2-12 grossly intact. Sensation intact.  Strength 5/5 in all 4.  Psychiatric: Normal judgment and insight. Alert and oriented x 3. Normal mood.   Labs on Admission: I have personally reviewed following labs and imaging studies  CBC:  Recent Labs Lab 04/16/16 1253 04/21/16 1348  WBC 10.4* 8.0  NEUTROABS 9.3* 7.0  HGB 11.7* 13.1  HCT 36.7* 40.3  MCV 89.5 91.6  PLT 98* 161*   Basic Metabolic Panel:  Recent Labs Lab 04/16/16 1253 04/21/16 1443  NA 140 133*  K 4.0 3.5  CL  --  98*  CO2 31* 26  GLUCOSE 146* 180*  BUN 10.0 11  CREATININE 0.8 0.82  CALCIUM 9.3 8.8*   GFR: Estimated Creatinine Clearance: 101 mL/min (by C-G formula based on Cr of 0.82). Liver Function Tests:  Recent Labs Lab 04/16/16 1253  AST 17  ALT 28  ALKPHOS 102  BILITOT 0.43  PROT 6.0*  ALBUMIN 3.5   Urine analysis:    Component Value Date/Time   COLORURINE YELLOW 10/05/2015 0958   APPEARANCEUR CLEAR 10/05/2015 0958   LABSPEC 1.025 10/05/2015 0958   PHURINE 6.0 10/05/2015 0958   GLUCOSEU NEGATIVE 10/05/2015 0958   GLUCOSEU 100* 04/11/2015 0342   HGBUR NEGATIVE 10/05/2015 0958   BILIRUBINUR NEGATIVE 10/05/2015 0958   BILIRUBINUR n 08/14/2011 1449   KETONESUR NEGATIVE 10/05/2015 0958   PROTEINUR >300* 04/11/2015 0342   PROTEINUR n 08/14/2011 1449   UROBILINOGEN 0.2 10/05/2015 0958   UROBILINOGEN 0.2 08/14/2011 1449   NITRITE NEGATIVE 10/05/2015 0958   NITRITE n 08/14/2011 1449   LEUKOCYTESUR NEGATIVE 10/05/2015 0958   Sepsis Labs:  First lactic acid 2.73, repeat pending  Radiological Exams on Admission: Dg Chest 2 View  04/21/2016  CLINICAL DATA:  60 year old male with increasing  shortness of breath and sore throat. History of lung cancer, undergoing chemotherapy. If EXAM: CHEST  2 VIEW COMPARISON:  Chest x-ray 04/16/2016. FINDINGS: Again noted is a pulmonary mass in the left lower lobe, estimated to measure approximately 3.0 x 3.3 x 3.0 cm. There is some chronic scarring and volume loss right middle lobe, as evidenced by downward displacement of the minor fissure. Some ill-defined opacity in the right middle lobe is also noted, which may suggest some developing airspace consolidation. Small right and trace left pleural effusions. No evidence of pulmonary edema. Chronic pleuroparenchymal thickening in the apex of the left hemithorax, very similar to prior examination. Within this area there appears to be a 2.6 cm cavity, also similar to priors. Heart size is normal. The patient is rotated to the left on today's exam, resulting in distortion of the mediastinal contours and reduced diagnostic sensitivity and specificity for mediastinal pathology. Orthopedic fixation hardware in the lower cervical spine incidentally noted. IMPRESSION: 1. Worsening atelectasis and airspace consolidation in the right middle lobe, concerning for developing right middle lobe pneumonia. 2. Right lower  lobe pulmonary mass, similar to recent prior examinations, but more prominent than remote prior studies, suggesting progression of disease. 3. Small right and trace left pleural effusions. 4. Chronic scarring and cavitation in the left apex again noted. Electronically Signed   By: Vinnie Langton M.D.   On: 04/21/2016 13:19    EKG: Independently reviewed. Sinus tachycardia.  No acute ST segment changes.  Assessment/Plan Principal Problem:   HCAP (healthcare-associated pneumonia) Active Problems:   Essential hypertension   COPD with asthma ([place])   Squamous cell lung cancer ([place])   Diabetes mellitus without complication ([place])   Coronary artery disease involving native coronary artery of native heart without  angina pectoris   Sepsis ([place])   Thrombocytopenia ([place])  HCAP with sepsis --Empiric cefepime and vancomycin --Blood and urine cultures --Respiratory therapy --Continue supplemental oxygen at 3L --IS and flutter valve use --Large volume resuscitation ordered in the ED; NS at 75cc/hr now  NSCLC --Notify Dr. Inda Merlin of admission on Tuesday in case adjustments need to be made in chemo schedule  Thrombocytopenia --possibly secondary to chemotherapy --Monitor trend --Avoid heparin products for now  COPD --Continue home inhalers, theophylline, nebulizer treatments as needed --Continue low dose prednisone for now (IV solumedrol x 1 given in the ED)  DM --Patient reports that he has been on and off medication --Will monitor blood sugar while in the hospital and cover as needed with SSI --Check A1c  Atypical chest pain with history of CAD --Will check troponin but likely musculoskeletal or pleuritic  Chronic pain --Continue morphine regimen, robaxin, neurontin  GERD --PPI  Mood disturbance, history of insomnia --Continue abilify, klonopin, prozac, remeron --Some of patient's home medications are nonformulary and have not been ordered  DVT prophylaxis: SCDs Code Status: FULL Family Communication: Wife at bedside Disposition Plan: Home  Consults called: NONE Admission status: Inpatient, telemetry  Eber Jones MD [facility]  If 7PM-7AM, please contact night-coverage [email or web address] Password [facility]  04/21/2016, 6:32 PM

## 2016-04-22 ENCOUNTER — Inpatient Hospital Stay (HOSPITAL_COMMUNITY): Payer: Commercial Managed Care - HMO

## 2016-04-22 ENCOUNTER — Encounter (HOSPITAL_COMMUNITY): Payer: Self-pay | Admitting: Radiology

## 2016-04-22 LAB — GLUCOSE, CAPILLARY
GLUCOSE-CAPILLARY: 228 mg/dL — AB (ref 65–99)
Glucose-Capillary: 269 mg/dL — ABNORMAL HIGH (ref 65–99)
Glucose-Capillary: 250 mg/dL — ABNORMAL HIGH (ref 65–99)
Glucose-Capillary: 222 mg/dL — ABNORMAL HIGH (ref 65–99)

## 2016-04-22 LAB — TROPONIN I
Troponin I: <0.03 ng/mL (ref <0.031)
Troponin I: <0.03 ng/mL (ref <0.031)

## 2016-04-22 LAB — EXPECTORATED SPUTUM ASSESSMENT W REFEX TO RESP CULTURE

## 2016-04-22 LAB — LACTIC ACID, PLASMA: LACTIC ACID, VENOUS: 1.7 mmol/L (ref 0.5–2.0)

## 2016-04-22 LAB — STREP PNEUMONIAE URINARY ANTIGEN: Strep Pneumo Urinary Antigen: NEGATIVE

## 2016-04-22 LAB — MRSA PCR SCREENING: MRSA BY PCR: NEGATIVE

## 2016-04-22 LAB — EXPECTORATED SPUTUM ASSESSMENT W GRAM STAIN, RFLX TO RESP C

## 2016-04-22 MED ORDER — INSULIN ASPART 100 UNIT/ML ~~LOC~~ SOLN
0.0000 [IU] | Freq: Three times a day (TID) | SUBCUTANEOUS | Status: DC
  Administered 2016-04-22 (×2): 5 [IU] via SUBCUTANEOUS
  Administered 2016-04-23: 2 [IU] via SUBCUTANEOUS
  Administered 2016-04-23 (×2): 5 [IU] via SUBCUTANEOUS
  Administered 2016-04-24 (×2): 3 [IU] via SUBCUTANEOUS
  Administered 2016-04-24: 2 [IU] via SUBCUTANEOUS
  Administered 2016-04-25: 11 [IU] via SUBCUTANEOUS
  Administered 2016-04-25: 5 [IU] via SUBCUTANEOUS

## 2016-04-22 MED ORDER — IOPAMIDOL (ISOVUE-300) INJECTION 61%
75.0000 mL | Freq: Once | INTRAVENOUS | Status: AC | PRN
  Administered 2016-04-22: 75 mL via INTRAVENOUS

## 2016-04-22 MED ORDER — SODIUM CHLORIDE 0.9 % IV SOLN: INTRAVENOUS | Status: AC

## 2016-04-22 MED ORDER — INSULIN ASPART 100 UNIT/ML ~~LOC~~ SOLN
0.0000 [IU] | Freq: Every day | SUBCUTANEOUS | Status: DC
  Administered 2016-04-22 – 2016-04-24 (×2): 2 [IU] via SUBCUTANEOUS

## 2016-04-22 MED ORDER — INSULIN ASPART 100 UNIT/ML ~~LOC~~ SOLN
4.0000 [IU] | Freq: Three times a day (TID) | SUBCUTANEOUS | Status: DC
  Administered 2016-04-22 – 2016-04-25 (×10): 4 [IU] via SUBCUTANEOUS

## 2016-04-22 MED ORDER — INSULIN DETEMIR 100 UNIT/ML ~~LOC~~ SOLN
10.0000 [IU] | Freq: Every day | SUBCUTANEOUS | Status: DC
  Administered 2016-04-22: 10 [IU] via SUBCUTANEOUS
  Filled 2016-04-22: qty 0.1

## 2016-04-22 NOTE — Progress Notes (Signed)
[facility] PROGRESS NOTE    Progress Note  Ernest [name]  HEN:[id number] DOB: January 04, 1956 DOA: 04/21/2016 PCP: [name], MD     Brief Narrative:   Ernest [name] is an 60 y.o. 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  Assessment/Plan:   Sepsis ([place]) due to PNA: Unclear whether it's community-acquired pneumonia versus postobstructive, CT scan of the chest with contrast. He has been afebrile with no leukocytosis, but he is under chemotherapy. His lactic acidosis resolved with fluid resuscitation. Continue empiric antibiotics. Continue use flutter valve  COPD: Oxygen dependent, no wheezing on physical exam, back on his home steroid dose  Squamous cell lung cancer ([place]): Will notify Dr. Earlie Server.  Diabetes mellitus without complication ([place]) We'll start him on low-dose long-acting insulin plus sliding scale insulin, will check an A1c. He is in no oral hypoglycemic agents at home.  Coronary artery disease involving native coronary artery of native heart without angina pectoris: Chest and this is probably due to his pneumonia.  Thrombocytopenia ([place]): Possibly due to chemotherapy place SCDs avoid heparins  Chronic pain: Continue current home regimen.  Mood disturbances/history of insomnia: Continue Abilify Klonopin, Prozac and Remeron.  DVT prophylaxis: SCD's Family Communication:none Disposition Plan/Barrier to D/C: home 4 days Code Status:     Code Status Orders        Start     Ordered   04/21/16 1827  Full code   Continuous     04/21/16 1829    Code Status History    Date Active Date Inactive Code Status Order ID Comments User Context   01/18/2016  4:43 AM 01/21/2016  5:44 PM Full Code  [id number]  Etta Quill, DO ED   11/24/2015  4:44 AM 11/30/2015  4:42 PM Full Code [id number]  Etta Quill, DO ED   08/10/2015  4:42 PM 08/11/2015  5:54 PM Full Code [id number]  Lorretta Harp, MD Inpatient   04/11/2015  3:17 AM 04/15/2015  2:32 PM Full Code [id number]  Rahul Dianna Rossetti, PA-C ED   02/15/2014  4:02 AM 02/17/2014 10:40 PM Full Code [id number]  Berle Mull, MD ED    Advance Directive Documentation        Most Recent Value   Type of Advance Directive  Healthcare Power of Attorney   Pre-existing out of facility DNR order (yellow form or pink MOST form)     "MOST" Form in Place?          IV Access:    Peripheral IV   Procedures and diagnostic studies:   Dg Chest 2 View  04/21/2016  CLINICAL DATA:  60 year old male with increasing shortness of breath and sore throat. History of lung cancer, undergoing chemotherapy. If EXAM: CHEST  2 VIEW COMPARISON:  Chest x-ray 04/16/2016. FINDINGS: Again noted is a pulmonary mass in the left lower lobe, estimated to measure approximately 3.0 x 3.3 x 3.0 cm. There is some chronic scarring and volume loss right middle lobe, as evidenced by downward displacement of the minor fissure. Some ill-defined opacity in the right middle lobe is also noted, which may suggest some developing airspace consolidation. Small right and trace left pleural effusions. No evidence of pulmonary edema. Chronic pleuroparenchymal thickening in the apex of the left hemithorax, very similar to prior  examination. Within this area there appears to be a 2.6 cm cavity, also similar to priors. Heart size is normal. The patient is rotated to the left on today's exam, resulting in distortion of the mediastinal contours and reduced diagnostic sensitivity and specificity for mediastinal pathology. Orthopedic fixation hardware in the lower cervical spine incidentally noted. IMPRESSION: 1. Worsening atelectasis and airspace consolidation in the right middle lobe, concerning for developing  right middle lobe pneumonia. 2. Right lower lobe pulmonary mass, similar to recent prior examinations, but more prominent than remote prior studies, suggesting progression of disease. 3. Small right and trace left pleural effusions. 4. Chronic scarring and cavitation in the left apex again noted. Electronically Signed   By: Vinnie Langton M.D.   On: 04/21/2016 13:19     Medical Consultants:    None.  Anti-Infectives:   Vancomycin and cefepime 06/13/2016  Subjective:    Ernest [name] is about the same still mildly anorexic and tired. Continues to have a productive cough.  Objective:    Filed Vitals:   04/21/16 2120 04/22/16 0139 04/22/16 0557 04/22/16 0758  BP: 146/63  138/56   Pulse: 79  62   Temp: 98.1 F (36.7 C)  97.7 F (36.5 C)   TempSrc: Oral  Oral   Resp: 20  18   Height:      Weight:      SpO2: 100% 99% 99% 100%    Intake/Output Summary (Last 24 hours) at 04/22/16 0856 Last data filed at 04/22/16 0655  Gross per 24 hour  Intake 1646.25 ml  Output   2100 ml  Net -453.75 ml   Filed Weights   04/21/16 1607 04/21/16 1717  Weight: 92.534 kg (204 lb) 89.495 kg (197 lb 4.8 oz)    Exam: General exam: In no acute distress. Respiratory system: Good air movement , With crackles on the right. Cardiovascular system: S1 & S2 heard, RRR. No JVD, murmurs, rubs, gallops or clicks.  Gastrointestinal system: Abdomen is nondistended, soft and nontender.  Central nervous system: Alert and oriented. No focal neurological deficits. Extremities: No pedal edema. Skin: No rashes, lesions or ulcers Psychiatry: Judgement and insight appear normal. Mood & affect appropriate.    Data Reviewed:    Labs: Basic Metabolic Panel:  Recent Labs Lab 04/16/16 1253 04/21/16 1443  NA 140 133*  K 4.0 3.5  CL  --  98*  CO2 31* 26  GLUCOSE 146* 180*  BUN 10.0 11  CREATININE 0.8 0.82  CALCIUM 9.3 8.8*   GFR Estimated Creatinine Clearance: 101 mL/min (by C-G formula  based on Cr of 0.82). Liver Function Tests:  Recent Labs Lab 04/16/16 1253 04/21/16 1850  AST 17 16  ALT 28 18  ALKPHOS 102 77  BILITOT 0.43 1.0  PROT 6.0* 6.3*  ALBUMIN 3.5 3.5   No results for input(s): LIPASE, AMYLASE in the last 168 hours. No results for input(s): AMMONIA in the last 168 hours. Coagulation profile  Recent Labs Lab 04/21/16 1850  INR 1.14    CBC:  Recent Labs Lab 04/16/16 1253 04/21/16 1348  WBC 10.4* 8.0  NEUTROABS 9.3* 7.0  HGB 11.7* 13.1  HCT 36.7* 40.3  MCV 89.5 91.6  PLT 98* 103*   Cardiac Enzymes:  Recent Labs Lab 04/21/16 1850 04/22/16 0015 04/22/16 0625  TROPONINI <0.03 <0.03 <0.03   BNP (last 3 results) No results for input(s): PROBNP in the last 8760 hours. CBG:  Recent Labs Lab 04/21/16 2057 04/22/16 0737  GLUCAP 262* 269*  D-Dimer: No results for input(s): DDIMER in the last 72 hours. Hgb A1c: No results for input(s): HGBA1C in the last 72 hours. Lipid Profile: No results for input(s): CHOL, HDL, LDLCALC, TRIG, CHOLHDL, LDLDIRECT in the last 72 hours. Thyroid function studies: No results for input(s): TSH, T4TOTAL, T3FREE, THYROIDAB in the last 72 hours.  Invalid input(s): FREET3 Anemia work up: No results for input(s): VITAMINB12, FOLATE, FERRITIN, TIBC, IRON, RETICCTPCT in the last 72 hours. Sepsis Labs:  Recent Labs Lab 04/16/16 1253 04/21/16 1348 04/21/16 1402 04/21/16 1850 04/22/16 0230  WBC 10.4* 8.0  --   --   --   LATICACIDVEN  --   --  2.73* 2.4* 1.7   Microbiology Recent Results (from the past 240 hour(s))  Culture, sputum-assessment     Status: None   Collection Time: 04/21/16  9:40 PM  Result Value Ref Range Status   Specimen Description EXPECTORATED SPUTUM  Final   Special Requests Immunocompromised  Final   Sputum evaluation   Final    THIS SPECIMEN IS ACCEPTABLE. RESPIRATORY CULTURE REPORT TO FOLLOW.   Report Status 04/22/2016 FINAL  Final  Culture, respiratory (NON-Expectorated)      Status: None (Preliminary result)   Collection Time: 04/21/16  9:40 PM  Result Value Ref Range Status   Specimen Description SPUTUM  Final   Special Requests NONE  Final   Gram Stain   Final    FEW WBC PRESENT, PREDOMINANTLY PMN FEW GRAM POSITIVE COCCI IN CLUSTERS Performed at [facility]    Culture PENDING  Incomplete   Report Status PENDING  Incomplete     Medications:   . ARIPiprazole  5 mg Oral QHS  . aspirin EC  81 mg Oral Daily  . atorvastatin  20 mg Oral Daily  . ceFEPime (MAXIPIME) IV  1 g Intravenous Q8H  . clopidogrel  75 mg Oral Daily  . FLUoxetine  40 mg Oral q morning - 10a  . gabapentin  100 mg Oral Daily  . insulin aspart  0-15 Units Subcutaneous TID WC  . isosorbide mononitrate  60 mg Oral Daily  . metoprolol succinate  12.5 mg Oral Daily  . mirtazapine  45 mg Oral QHS  . mometasone-formoterol  2 puff Inhalation BID  . morphine  30 mg Oral Q12H  . pantoprazole  40 mg Oral Daily  . potassium chloride SA  20 mEq Oral q morning - 10a  . predniSONE  5 mg Oral QAC breakfast  . roflumilast  500 mcg Oral Daily  . theophylline  400 mg Oral Daily  . tiotropium  18 mcg Inhalation Daily  . tobramycin-dexamethasone  2 drop Both Eyes Q6H  . vancomycin  1,000 mg Intravenous Q8H   Continuous Infusions: . sodium chloride 75 mL/hr at 04/22/16 0655    Time spent: 25 min   LOS: 1 day   Charlynne Cousins  [facility] Pager [phone]  *Please refer to [email or web address], password [email or web address] to get updated schedule on who will round on this patient, as hospitalists switch teams weekly. If 7PM-7AM, please contact night-coverage at [email or web address], password [email or web address] for any overnight needs.  04/22/2016, 8:56 AM

## 2016-04-22 NOTE — Care Management Note (Signed)
Case Management Note  Patient Details  Name: Ernest [name] MRN: [id number] Date of Birth: 1956/06/12  Subjective/Objective: 60 y/o m admitted w/HCAP. From home. 3 adm/past 51month. Active w/[facility]. Would recc HHRN-disease mgmnt.                   Action/Plan:d/c plan home.   Expected Discharge Date:                 Expected Discharge Plan:  [place] Referral:     Discharge planning Services  CM Consult  Post Acute Care Choice:   (Active w/[facility]) Choice offered to:     DME Arranged:    DME Agency:     HH Arranged:    HH Agency:     Status of Service:  In process, will continue to follow  Medicare Important Message Given:    Date Medicare IM Given:    Medicare IM give by:    Date Additional Medicare IM Given:    Additional Medicare Important Message give by:     If discussed at [place] Meetings, dates discussed:    Additional Comments:  MDessa Phi RN 04/22/2016, 12:30 PM

## 2016-04-22 NOTE — Clinical Documentation Improvement (Signed)
Internal Medicine  Patient with Lung Cancer, history of COPD; is Oxygen Dependent. Currently on 3L FiO2 while in-house. Please provide a diagnosis associated with Oxygen Dependence and document findings in next progress note. Thank you!   Respiratory Failure - acute, chronic, acute on chronic with Hypoxia, Hypercarbia, Combination of both  Respiratory Distress  Other Condition  Clinically Undetermined  Document any associated diagnoses/conditions.  Supporting Information:  As above; patient remains of 3L FiO2.  Please exercise your independent, professional judgment when responding. A specific answer is not anticipated or expected.  Thank You,  Zoila Shutter RN, BSN, [place] [phone]; Cell: [phone]

## 2016-04-23 ENCOUNTER — Other Ambulatory Visit: Payer: Commercial Managed Care - HMO

## 2016-04-23 DIAGNOSIS — J9611 Chronic respiratory failure with hypoxia: Secondary | ICD-10-CM

## 2016-04-23 LAB — CREATININE, SERUM
Creatinine, Ser: 0.67 mg/dL (ref 0.61–1.24)
GFR calc Af Amer: >60 mL/min (ref >60)
GFR calc non Af Amer: >60 mL/min (ref >60)

## 2016-04-23 LAB — URINE CULTURE: Culture: NO GROWTH

## 2016-04-23 LAB — CBC
HCT: 35.1 % — ABNORMAL LOW (ref 39.0–52.0)
Hemoglobin: 11.2 g/dL — ABNORMAL LOW (ref 13.0–17.0)
MCH: 29.1 pg (ref 26.0–34.0)
MCHC: 31.9 g/dL (ref 30.0–36.0)
MCV: 91.2 fL (ref 78.0–100.0)
PLATELETS: 119 10*3/uL — AB (ref 150–400)
RBC: 3.85 MIL/uL — AB (ref 4.22–5.81)
RDW: 18.8 % — AB (ref 11.5–15.5)
WBC: 6.8 10*3/uL (ref 4.0–10.5)

## 2016-04-23 LAB — HEMOGLOBIN A1C
Hgb A1c MFr Bld: 8.1 % — ABNORMAL HIGH (ref 4.8–5.6)
Hgb A1c MFr Bld: 8.1 % — ABNORMAL HIGH (ref 4.8–5.6)
Mean Plasma Glucose: 186 mg/dL
Mean Plasma Glucose: 186 mg/dL

## 2016-04-23 LAB — GLUCOSE, CAPILLARY
GLUCOSE-CAPILLARY: 225 mg/dL — AB (ref 65–99)
Glucose-Capillary: 210 mg/dL — ABNORMAL HIGH (ref 65–99)
Glucose-Capillary: 148 mg/dL — ABNORMAL HIGH (ref 65–99)
Glucose-Capillary: 163 mg/dL — ABNORMAL HIGH (ref 65–99)

## 2016-04-23 LAB — HIV ANTIBODY (ROUTINE TESTING W REFLEX): HIV Screen 4th Generation wRfx: NONREACTIVE

## 2016-04-23 MED ORDER — INSULIN DETEMIR 100 UNIT/ML ~~LOC~~ SOLN
10.0000 [IU] | Freq: Two times a day (BID) | SUBCUTANEOUS | Status: DC
  Administered 2016-04-23 – 2016-04-25 (×5): 10 [IU] via SUBCUTANEOUS
  Filled 2016-04-23 (×6): qty 0.1

## 2016-04-23 MED ORDER — ALBUTEROL SULFATE (2.5 MG/3ML) 0.083% IN NEBU
2.5000 mg | INHALATION_SOLUTION | Freq: Two times a day (BID) | RESPIRATORY_TRACT | Status: DC
  Administered 2016-04-23 – 2016-04-24 (×3): 2.5 mg via RESPIRATORY_TRACT
  Filled 2016-04-23 (×3): qty 3

## 2016-04-23 NOTE — Clinical Documentation Improvement (Signed)
Patient with Lung Cancer, history of COPD; is Oxygen Dependent. Currently on 3L FiO2 while in-house. Please provide a diagnosis associated with Oxygen Dependence and document findings in next progress note. Thank you!   Respiratory Failure - acute, chronic, acute on chronic with Hypoxia, Hypercarbia, Combination of both  Respiratory Distress  Other Condition  Clinically Undetermined  Document any associated diagnoses/conditions.  Supporting Information:  As above; patient remains of 3L FiO2.  Please exercise your independent, professional judgment when responding. A specific answer is not anticipated or expected.  Thank You,  Zoila Shutter RN, BSN, [place] [phone]; Cell: [phone]

## 2016-04-23 NOTE — Evaluation (Signed)
Physical Therapy One Time Evaluation Patient Details Name: Ernest [name] MRN: [id number] DOB: 06/21/1956 Today's Date: 04/23/2016   History of Present Illness  60 y.o. male past medical history of non-small cell cancer diagnosed in 2017, COPD on 3L oxygen, PVD, diabetes mellitus, undergoing palliative chemotherapy and admitted for sepsis due to pneumonia  Clinical Impression  Patient evaluated by Physical Therapy with no further acute PT needs identified. All education has been completed and the patient has no further questions.  Pt mobilizing well and SPO2 remaining in high 90s with ambulation (on baseline 3L O2).  Pt agreeable to ambulate with nursing staff during acute stay. See below for any follow-up Physical Therapy or equipment needs. PT is signing off. Thank you for this referral.     Follow Up Recommendations No PT follow up    Equipment Recommendations  None recommended by PT    Recommendations for Other Services       Precautions / Restrictions Precautions Precaution Comments: chronic 3L O2      Mobility  Bed Mobility Overal bed mobility: Modified Independent                Transfers Overall transfer level: Needs assistance Equipment used: None Transfers: Sit to/from Stand Sit to Stand: Supervision;Modified independent (Device/Increase time)            Ambulation/Gait Ambulation/Gait assistance: Supervision;Modified independent (Device/Increase time) Ambulation Distance (Feet): 400 Feet Assistive device: None Gait Pattern/deviations: Step-through pattern;Decreased stride length     General Gait Details: pt pushed IV pole, remained on 3L O2 [place] SpO2 96-100%, pt with 2/4 dyspnea however reports baseline for mobility, a few standing rest breaks required  Stairs            Wheelchair Mobility    Modified Rankin (Stroke Patients Only)       Balance                                             Pertinent  Vitals/Pain Pain Assessment: No/denies pain    Home Living Family/patient expects to be discharged to:: Private residence Living Arrangements: Spouse/significant other Available Help at Discharge: Family;Available PRN/intermittently Type of Home: House Home Access: Stairs to enter   Entrance Stairs-Number of Steps: 2 Home Layout: One level Home Equipment: Walker - 2 wheels;Cane - single point      Prior Function Level of Independence: Independent               Hand Dominance        Extremity/Trunk Assessment   Upper Extremity Assessment: Overall WFL for tasks assessed           Lower Extremity Assessment: Overall WFL for tasks assessed         Communication   Communication: No difficulties  Cognition Arousal/Alertness: Awake/alert Behavior During Therapy: WFL for tasks assessed/performed Overall Cognitive Status: Within Functional Limits for tasks assessed                      General Comments      Exercises        Assessment/Plan    PT Assessment [place] does not need any further PT services  PT Diagnosis Difficulty walking   PT Problem List    PT Treatment Interventions     PT Goals (Current goals can be found in the Care Plan section) Acute  Rehab PT Goals PT Goal Formulation: All assessment and education complete, DC therapy    Frequency     Barriers to discharge        Co-evaluation               End of Session Equipment Utilized During Treatment: Oxygen Activity Tolerance: Patient tolerated treatment well Patient left: in chair;with call bell/phone within reach Nurse Communication: Mobility status         Time: 4707-6151 PT Time Calculation (min) (ACUTE ONLY): 16 min   Charges:   PT Evaluation $PT Eval Low Complexity: 1 Procedure     PT G Codes:        Hadley Detloff,KATHrine E 04/23/2016, 12:28 PM Carmelia Bake, PT, DPT 04/23/2016 Pager: [phone]

## 2016-04-23 NOTE — Progress Notes (Signed)
[facility] PROGRESS NOTE    Progress Note  Ernest [name]  EBR:[id number] DOB: 09-19-1956 DOA: 04/21/2016 PCP: [name], MD     Brief Narrative:   Ernest [name] is an 60 y.o. 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  Assessment/Plan:   Sepsis ([place]) due to PNA: Unclear whether it's community-acquired pneumonia versus postobstructive, CT scan of the chest with contrast And enlarging mass, Oncology is leaning more towards a community-acquired pneumonia as this mass was not there last Pet-CAT scan. Continue empiric antibiotics. Continue use flutter valve  Chronic respiratory failure with hypoxia due to COPD: Oxygen dependent, no wheezing on physical exam, back on his home steroid dose  Squamous cell lung cancer [facility]): Appreciate Dr. Lew Dawes assistance.  Diabetes mellitus without complication (HCC) Glucose continues to be high, his hemoglobin A1c was 8.1. I will increase his long-acting insulin  Coronary artery disease involving native coronary artery of native heart without angina pectoris: Chest pain and now resolved was probably due to pneumonia.  Thrombocytopenia ([place]): Improving. Likely to chemotherapy.  Chronic pain: Continue current home regimen.  Mood disturbances/history of insomnia: Continue Abilify Klonopin, Prozac and Remeron.  DVT prophylaxis: SCD's Family Communication:none Disposition Plan/Barrier to D/C: home 3 days Code Status:     Code Status Orders        Start     Ordered   04/21/16 1827  Full code   Continuous     04/21/16 1829    Code Status History    Date Active Date Inactive Code Status Order ID Comments User Context   01/18/2016  4:43 AM 01/21/2016  5:44 PM Full  Code [id number]  Etta Quill, DO ED   11/24/2015  4:44 AM 11/30/2015  4:42 PM Full Code [id number]  Etta Quill, DO ED   08/10/2015  4:42 PM 08/11/2015  5:54 PM Full Code [id number]  Lorretta Harp, MD Inpatient   04/11/2015  3:17 AM 04/15/2015  2:32 PM Full Code [id number]  Rahul Dianna Rossetti, PA-C ED   02/15/2014  4:02 AM 02/17/2014 10:40 PM Full Code [id number]  Berle Mull, MD ED    Advance Directive Documentation        Most Recent Value   Type of Advance Directive  Healthcare Power of Attorney   Pre-existing out of facility DNR order (yellow form or pink MOST form)     "MOST" Form in Place?          IV Access:    Peripheral IV   Procedures and diagnostic studies:   Dg Chest 2 View  04/21/2016  CLINICAL DATA:  60 year old male with increasing shortness of breath and sore throat. History of lung cancer, undergoing chemotherapy. If EXAM: CHEST  2 VIEW COMPARISON:  Chest x-ray 04/16/2016. FINDINGS: Again noted is a pulmonary mass in the left lower lobe, estimated to measure approximately 3.0 x 3.3 x 3.0 cm. There is some chronic scarring and volume loss right middle lobe, as evidenced by downward displacement of the minor fissure. Some ill-defined opacity in the right middle lobe is also noted, which may suggest some developing airspace consolidation. Small right and trace left pleural effusions. No evidence of pulmonary edema. Chronic pleuroparenchymal thickening in the apex of the left hemithorax, very similar to prior examination. Within this  area there appears to be a 2.6 cm cavity, also similar to priors. Heart size is normal. The patient is rotated to the left on today's exam, resulting in distortion of the mediastinal contours and reduced diagnostic sensitivity and specificity for mediastinal pathology. Orthopedic fixation hardware in the lower cervical spine incidentally noted. IMPRESSION: 1. Worsening atelectasis and airspace consolidation in the right middle lobe, concerning for  developing right middle lobe pneumonia. 2. Right lower lobe pulmonary mass, similar to recent prior examinations, but more prominent than remote prior studies, suggesting progression of disease. 3. Small right and trace left pleural effusions. 4. Chronic scarring and cavitation in the left apex again noted. Electronically Signed   By: Vinnie Langton M.D.   On: 04/21/2016 13:19   Ct Chest W Contrast  04/22/2016  CLINICAL DATA:  Cough. Current history of non-small-cell lung cancer. EXAM: CT CHEST WITH CONTRAST TECHNIQUE: Multidetector CT imaging of the chest was performed during intravenous contrast administration. CONTRAST:  22m ISOVUE-300 IOPAMIDOL (ISOVUE-300) INJECTION 61% COMPARISON:  PET scan of January 23, 2016. FINDINGS: There is no evidence of pneumothorax. Right lower lobe mass measuring 33 x 26 mm is noted which is significantly increased in size compared to prior exam, consistent with metastatic lesion. Stable fibrosis and scarring is noted in left lung apex most consistent with radiation fibrosis. Coronary artery calcifications are noted. Stable 7 mm subpleural nodule is noted laterally in left lower lobe best seen on image number 90 of series 5. Given the lack of change this most likely is of benign etiology, but metastatic disease cannot be excluded. Mild right pleural effusion is noted with adjacent subsegmental atelectasis. New consolidation is noted in the right middle lobe consistent with pneumonia, atelectasis or possibly fibrosis. There appears to be narrowing of the bronchi in this area, concerning for neoplasm or malignancy. 23 x 8 mm right peritracheal lymph node is noted which is increased compared to prior exam and concerning for metastatic disease. 8 mm right peritracheal lymph node is noted more inferiorly which is not significantly changed compared to prior exam. 18 x 12 mm sub carinal lymph node is noted which is increased compared to prior exam. Multiple enlarged lymph nodes are  noted adjacent to the descending thoracic aorta with the largest measuring 11 x 10 mm. This is increased compared to prior exam. Within the visualized portion of the upper abdomen, stable cystic lesion is noted in the spleen. No other abnormality is noted. No significant osseous abnormality is noted. IMPRESSION: 33 x 26 mm right lower lobe mass is noted which is significantly increased in size compared to prior exam, consistent with worsening metastatic lesion. Stable probable post radiation fibrosis and scarring is noted in left lung apex. Coronary artery calcifications are noted suggesting coronary artery disease. Stable 7 mm subpleural nodule is noted laterally in left lower lobe. This most likely is benign in etiology given the lack of change, but metastatic disease cannot be excluded. Mediastinal adenopathy is noted as described above, most of which is increased in size and concerning for metastatic disease. Mild right pleural effusion is noted with adjacent subsegmental atelectasis. New opacity is noted in right middle lobe most consistent with postobstructive pneumonia or atelectasis given the narrowing of the central bronchi in this area. This is concerning for possible neoplasm or malignancy, and bronchoscopy is recommended for further evaluation. Electronically Signed   By: JMarijo Conception M.D.   On: 04/22/2016 14:48     Medical Consultants:    None.  Anti-Infectives:   Vancomycin and cefepime 06/13/2016  Subjective:    Ernest [name] . His cough is better, his appetite is improved compared to yesterday but still is not hungry at times.  Objective:    Filed Vitals:   04/22/16 2146 04/23/16 0500 04/23/16 0528 04/23/16 0801  BP: 127/63  117/61   Pulse: 62  56   Temp: 97.8 F (36.6 C)  97.5 F (36.4 C)   TempSrc: Oral  Oral   Resp: 20  22   Height:      Weight:      SpO2: 100% 100% 98% 99%    Intake/Output Summary (Last 24 hours) at 04/23/16 0847 Last data filed at  04/23/16 0649  Gross per 24 hour  Intake   1030 ml  Output   3550 ml  Net  -2520 ml   Filed Weights   04/21/16 1607 04/21/16 1717  Weight: 92.534 kg (204 lb) 89.495 kg (197 lb 4.8 oz)    Exam: General exam: In no acute distress. Respiratory system: Good air movement , With crackles on the right Upper lung Cardiovascular system: S1 & S2 heard, RRR.  Gastrointestinal system: Abdomen is nondistended, soft and nontender.  Central nervous system: Alert and oriented. No focal neurological deficits. Extremities: No pedal edema. Skin: No rashes, lesions or ulcers Psychiatry: Judgement and insight appear normal. Mood & affect appropriate.    Data Reviewed:    Labs: Basic Metabolic Panel:  Recent Labs Lab 04/16/16 1253 04/21/16 1443 04/23/16 0439  NA 140 133*  --   K 4.0 3.5  --   CL  --  98*  --   CO2 31* 26  --   GLUCOSE 146* 180*  --   BUN 10.0 11  --   CREATININE 0.8 0.82 0.67  CALCIUM 9.3 8.8*  --    GFR Estimated Creatinine Clearance: 103.6 mL/min (by C-G formula based on Cr of 0.67). Liver Function Tests:  Recent Labs Lab 04/16/16 1253 04/21/16 1850  AST 17 16  ALT 28 18  ALKPHOS 102 77  BILITOT 0.43 1.0  PROT 6.0* 6.3*  ALBUMIN 3.5 3.5   No results for input(s): LIPASE, AMYLASE in the last 168 hours. No results for input(s): AMMONIA in the last 168 hours. Coagulation profile  Recent Labs Lab 04/21/16 1850  INR 1.14    CBC:  Recent Labs Lab 04/16/16 1253 04/21/16 1348 04/23/16 0439  WBC 10.4* 8.0 6.8  NEUTROABS 9.3* 7.0  --   HGB 11.7* 13.1 11.2*  HCT 36.7* 40.3 35.1*  MCV 89.5 91.6 91.2  PLT 98* 103* 119*   Cardiac Enzymes:  Recent Labs Lab 04/21/16 1850 04/22/16 0015 04/22/16 0625  TROPONINI <0.03 <0.03 <0.03   BNP (last 3 results) No results for input(s): PROBNP in the last 8760 hours. CBG:  Recent Labs Lab 04/22/16 0737 04/22/16 1158 04/22/16 1653 04/22/16 2138 04/23/16 0736  GLUCAP 269* 228* 250* 222* 225*    D-Dimer: No results for input(s): DDIMER in the last 72 hours. Hgb A1c:  Recent Labs  04/21/16 1850 04/22/16 0625  HGBA1C 8.1* 8.1*   Lipid Profile: No results for input(s): CHOL, HDL, LDLCALC, TRIG, CHOLHDL, LDLDIRECT in the last 72 hours. Thyroid function studies: No results for input(s): TSH, T4TOTAL, T3FREE, THYROIDAB in the last 72 hours.  Invalid input(s): FREET3 Anemia work up: No results for input(s): VITAMINB12, FOLATE, FERRITIN, TIBC, IRON, RETICCTPCT in the last 72 hours. Sepsis Labs:  Recent Labs Lab 04/16/16 1253 04/21/16 1348 04/21/16 1402  04/21/16 1850 04/22/16 0230 04/23/16 0439  WBC 10.4* 8.0  --   --   --  6.8  LATICACIDVEN  --   --  2.73* 2.4* 1.7  --    Microbiology Recent Results (from the past 240 hour(s))  Culture, blood (routine x 2)     Status: None (Preliminary result)   Collection Time: 04/21/16  2:33 PM  Result Value Ref Range Status   Specimen Description BLOOD RIGHT HAND  Final   Special Requests BOTTLES DRAWN AEROBIC AND ANAEROBIC 5CC  Final   Culture   Final    NO GROWTH < 24 HOURS Performed at [facility]    Report Status PENDING  Incomplete  Culture, blood (routine x 2)     Status: None (Preliminary result)   Collection Time: 04/21/16  2:43 PM  Result Value Ref Range Status   Specimen Description BLOOD LEFT HAND  Final   Special Requests IN PEDIATRIC BOTTLE 2CC  Final   Culture   Final    NO GROWTH < 24 HOURS Performed at [facility]    Report Status PENDING  Incomplete  Culture, sputum-assessment     Status: None   Collection Time: 04/21/16  9:40 PM  Result Value Ref Range Status   Specimen Description EXPECTORATED SPUTUM  Final   Special Requests Immunocompromised  Final   Sputum evaluation   Final    THIS SPECIMEN IS ACCEPTABLE. RESPIRATORY CULTURE REPORT TO FOLLOW.   Report Status 04/22/2016 FINAL  Final  Culture, respiratory (NON-Expectorated)     Status: None (Preliminary result)   Collection  Time: 04/21/16  9:40 PM  Result Value Ref Range Status   Specimen Description SPUTUM  Final   Special Requests NONE  Final   Gram Stain   Final    FEW WBC PRESENT, PREDOMINANTLY PMN FEW GRAM POSITIVE COCCI IN CLUSTERS Performed at [facility]    Culture PENDING  Incomplete   Report Status PENDING  Incomplete  MRSA PCR Screening     Status: None   Collection Time: 04/22/16 12:03 PM  Result Value Ref Range Status   MRSA by PCR NEGATIVE NEGATIVE Final    Comment:        The GeneXpert MRSA Assay (FDA approved for NASAL specimens only), is one component of a comprehensive MRSA colonization surveillance program. It is not intended to diagnose MRSA infection nor to guide or monitor treatment for MRSA infections.      Medications:   . ARIPiprazole  5 mg Oral QHS  . aspirin EC  81 mg Oral Daily  . atorvastatin  20 mg Oral Daily  . ceFEPime (MAXIPIME) IV  1 g Intravenous Q8H  . clopidogrel  75 mg Oral Daily  . FLUoxetine  40 mg Oral q morning - 10a  . gabapentin  100 mg Oral Daily  . insulin aspart  0-15 Units Subcutaneous TID WC  . insulin aspart  0-5 Units Subcutaneous QHS  . insulin aspart  4 Units Subcutaneous TID WC  . insulin detemir  10 Units Subcutaneous QHS  . isosorbide mononitrate  60 mg Oral Daily  . metoprolol succinate  12.5 mg Oral Daily  . mirtazapine  45 mg Oral QHS  . mometasone-formoterol  2 puff Inhalation BID  . morphine  30 mg Oral Q12H  . pantoprazole  40 mg Oral Daily  . potassium chloride SA  20 mEq Oral q morning - 10a  . predniSONE  5 mg Oral QAC breakfast  .  roflumilast  500 mcg Oral Daily  . theophylline  400 mg Oral Daily  . tiotropium  18 mcg Inhalation Daily  . tobramycin-dexamethasone  2 drop Both Eyes Q6H  . vancomycin  1,000 mg Intravenous Q8H   Continuous Infusions:    Time spent: 25 min   LOS: 2 days   Charlynne Cousins  [facility] Pager [phone]  *Please refer to [email or web address], password [email or web address] to get updated  schedule on who will round on this patient, as hospitalists switch teams weekly. If 7PM-7AM, please contact night-coverage at [email or web address], password [email or web address] for any overnight needs.  04/23/2016, 8:47 AM

## 2016-04-23 NOTE — Consult Note (Addendum)
   [facility] CM Inpatient Consult   04/23/2016  VAIDEN ADAMES 1956/08/06 [id number]    Patient is active with [facility] program. Martin Majestic to bedside to speak with Mr. Archambeau. However, he was on the phone and asked that writer come back at a later time. Will make inpatient RNCM aware that patient is active with [place] [facility] program. Please see chart review tab then notes for further patient outreach details by [facility].   Addendum: Went back to bedside to speak with Mr. Kinslow. Discussed ongoing follow up with [facility]. He reports his chemotherapy medication is being changed around as his cancer is getting larger instead of shrinking. He states " I have had a lot going on as of late". Support and encouragement offered. Reminded him that [facility] will not interfere or replace services provided by home health. Inpatient RNCM indicates patient will have home health at discharge. Mr. Carreras appreciative of visit. Contact information left at bedside. Sent notification to [place] of inpatient bedside visit.   Marthenia Rolling, MSN-Ed, RN,BSN [facility] Liaison [phone]

## 2016-04-23 NOTE — Progress Notes (Signed)
Inpatient Diabetes Program Recommendations  AACE/ADA: New Consensus Statement on Inpatient Glycemic Control (2015)  Target Ranges:  Prepandial:   less than 140 mg/dL      Peak postprandial:   less than 180 mg/dL (1-2 hours)      Critically ill patients:  140 - 180 mg/dL   Review of Glycemic Control  Results for TREVIAN, HAYASHIDA (MRN [id number]) as of 04/23/2016 09:56  Ref. Range 04/22/2016 07:37 04/22/2016 11:58 04/22/2016 16:53 04/22/2016 21:38 04/23/2016 07:36  Glucose-Capillary Latest Ref Range: 65-99 mg/dL 269 (H) 228 (H) 250 (H) 222 (H) 225 (H)  Results for TORIS, LAVERDIERE (MRN [id number]) as of 04/23/2016 09:56  Ref. Range 04/22/2016 06:25  Hemoglobin A1C Latest Ref Range: 4.8-5.6 % 8.1 (H)   Agree with orders.  Inpatient Diabetes Program Recommendations:    Add CHO mod med to heart healthy diet.  May need small amount of basal insulin at home.  Will continue to follow. Thank you. Lorenda Peck, RD, LDN, CDE Inpatient Diabetes Coordinator [phone]

## 2016-04-23 NOTE — Care Management Note (Signed)
Case Management Note  Patient Details  Name: Ernest [name] MRN: [id number] Date of Birth: 25-Mar-1956  Subjective/Objective: Home 02-[employer]-has travel tanks.Provided w/HHC agency resource list, await choice.                   Action/Plan:d/c home w/HHC.   Expected Discharge Date:                 Expected Discharge Plan:  [place]  In-House Referral:     Discharge planning Services  CM Consult  Post Acute Care Choice:   (Active w/[facility]) Choice offered to:  Patient  DME Arranged:    DME Agency:     HH Arranged:    [place] Agency:     Status of Service:  In process, will continue to follow  Medicare Important Message Given:    Date Medicare IM Given:    Medicare IM give by:    Date Additional Medicare IM Given:    Additional Medicare Important Message give by:     If discussed at [place] of Stay Meetings, dates discussed:    Additional Comments:  Dessa Phi, RN 04/23/2016, 1:38 PM

## 2016-04-24 DIAGNOSIS — J189 Pneumonia, unspecified organism: Secondary | ICD-10-CM

## 2016-04-24 DIAGNOSIS — C3491 Malignant neoplasm of unspecified part of right bronchus or lung: Secondary | ICD-10-CM

## 2016-04-24 LAB — GLUCOSE, CAPILLARY
GLUCOSE-CAPILLARY: 153 mg/dL — AB (ref 65–99)
GLUCOSE-CAPILLARY: 135 mg/dL — AB (ref 65–99)
Glucose-Capillary: 153 mg/dL — ABNORMAL HIGH (ref 65–99)
Glucose-Capillary: 209 mg/dL — ABNORMAL HIGH (ref 65–99)

## 2016-04-24 MED ORDER — DEXAMETHASONE SODIUM PHOSPHATE 4 MG/ML IJ SOLN
4.0000 mg | Freq: Four times a day (QID) | INTRAMUSCULAR | Status: AC
  Administered 2016-04-24 – 2016-04-25 (×3): 4 mg via INTRAVENOUS
  Filled 2016-04-24 (×3): qty 1

## 2016-04-24 MED ORDER — DEXTROSE 5 % IV SOLN
250.0000 mg | Freq: Once | INTRAVENOUS | Status: AC
  Administered 2016-04-24: 250 mg via INTRAVENOUS
  Filled 2016-04-24: qty 250

## 2016-04-24 MED ORDER — ALBUTEROL SULFATE (2.5 MG/3ML) 0.083% IN NEBU
2.5000 mg | INHALATION_SOLUTION | Freq: Four times a day (QID) | RESPIRATORY_TRACT | Status: DC
  Administered 2016-04-25 (×2): 2.5 mg via RESPIRATORY_TRACT
  Filled 2016-04-24 (×2): qty 3

## 2016-04-24 MED ORDER — DEXTROSE 5 % IV SOLN
500.0000 mg | INTRAVENOUS | Status: DC
  Administered 2016-04-24 – 2016-04-25 (×2): 500 mg via INTRAVENOUS
  Filled 2016-04-24 (×2): qty 500

## 2016-04-24 NOTE — Progress Notes (Signed)
Spoke with Oceanographer, the first dose of azithromycin was on partially-infused when the IV site was lost. 171m remained out of 2514mtotal.   I entered a supplemental '250mg'$  dose to approximate '500mg'$  given today.   ToRomeo RabonPharmD, pager [phone]/31/2017,2:39 PM.

## 2016-04-24 NOTE — Progress Notes (Signed)
[facility] PROGRESS NOTE    Progress Note  Ernest [name]  HQI:[id number] DOB: September 14, 1956 DOA: 04/21/2016 PCP: [name], MD     Brief Narrative:   Ernest [name] is an 60 y.o. 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  Assessment/Plan:   Sepsis ([place]) due to PNA: - Unclear whether it's community-acquired pneumonia versus postobstructive, CT scan of the chest with contrast And enlarging mass, Oncology is leaning more towards a community-acquired pneumonia as this mass was not there last Pet-CAT scan. - Year-old with IV vancomycin and cefepime, will discontinue vancomycin given negative MRSA PCR, and azithromycin for CAP coverage - Continue use flutter valve - We'll give IV steroids 2 doses today given some wheezing.  Chronic respiratory failure with hypoxia due to COPD: Oxygen dependent, no wheezing on physical exam, back on his home steroid dose  Squamous cell lung cancer [facility]): Appreciate Dr. Lew Dawes assistance.  Diabetes mellitus without complication (HCC) hemoglobin A1c was 8.1. CBC currently acceptable and insulin sliding scale and Levemir   Coronary artery disease involving native coronary artery of native heart without angina pectoris: Chest pain and now resolved was probably due to pneumonia.  Thrombocytopenia ([place]): Improving. Likely to chemotherapy.  Chronic pain: Continue current home regimen.  Mood disturbances/history of insomnia: Continue Abilify Klonopin, Prozac and Remeron.  DVT prophylaxis: SCD's Family Communication:none at bedside Disposition Plan/Barrier to D/C: Home in 1-2 days Code Status:     Code Status Orders        Start     Ordered   04/21/16 1827  Full code    Continuous     04/21/16 1829    Code Status History    Date Active Date Inactive Code Status Order ID Comments User Context   01/18/2016  4:43 AM 01/21/2016  5:44 PM Full Code [id number]  Etta Quill, DO ED   11/24/2015  4:44 AM 11/30/2015  4:42 PM Full Code [id number]  Etta Quill, DO ED   08/10/2015  4:42 PM 08/11/2015  5:54 PM Full Code [id number]  Lorretta Harp, MD Inpatient   04/11/2015  3:17 AM 04/15/2015  2:32 PM Full Code [id number]  Rahul Dianna Rossetti, PA-C ED   02/15/2014  4:02 AM 02/17/2014 10:40 PM Full Code [id number]  Berle Mull, MD ED    Advance Directive Documentation        Most Recent Value   Type of Advance Directive  Healthcare Power of Attorney   Pre-existing out of facility DNR order (yellow form or pink MOST form)     "MOST" Form in Place?          IV Access:    Peripheral IV   Procedures and diagnostic studies:   No results found.   Medical Consultants:    None.  Anti-Infectives:   Vancomycin and cefepime  Subjective:    Ernest [name] .Complains of cough, productive, but reports it is improving, he is feeling better today  Objective:    Filed Vitals:   04/24/16 0329 04/24/16 0512 04/24/16 0800 04/24/16 1230  BP:  135/67    Pulse:  63    Temp:  97.6 F (36.4 C)    TempSrc:  Oral    Resp:  22    Height:  Weight:      SpO2: 98% 100% 100% 98%    Intake/Output Summary (Last 24 hours) at 04/24/16 1258 Last data filed at 04/24/16 0700  Gross per 24 hour  Intake   1790 ml  Output    600 ml  Net   1190 ml   Filed Weights   04/21/16 1607 04/21/16 1717  Weight: 92.534 kg (204 lb) 89.495 kg (197 lb 4.8 oz)    Exam: General exam: In no acute distress. Respiratory system: Good air movement , Right midlung rales, scattered wheezing. Cardiovascular system: S1 & S2 heard, RRR.  Gastrointestinal system: Abdomen is nondistended, soft and nontender.  Central nervous system: Alert and oriented. No focal neurological  deficits. Extremities: No pedal edema. Skin: No rashes, lesions or ulcers Psychiatry: Judgement and insight appear normal. Mood & affect appropriate.    Data Reviewed:    Labs: Basic Metabolic Panel:  Recent Labs Lab 04/21/16 1443 04/23/16 0439  NA 133*  --   K 3.5  --   CL 98*  --   CO2 26  --   GLUCOSE 180*  --   BUN 11  --   CREATININE 0.82 0.67  CALCIUM 8.8*  --    GFR Estimated Creatinine Clearance: 103.6 mL/min (by C-G formula based on Cr of 0.67). Liver Function Tests:  Recent Labs Lab 04/21/16 1850  AST 16  ALT 18  ALKPHOS 77  BILITOT 1.0  PROT 6.3*  ALBUMIN 3.5   No results for input(s): LIPASE, AMYLASE in the last 168 hours. No results for input(s): AMMONIA in the last 168 hours. Coagulation profile  Recent Labs Lab 04/21/16 1850  INR 1.14    CBC:  Recent Labs Lab 04/21/16 1348 04/23/16 0439  WBC 8.0 6.8  NEUTROABS 7.0  --   HGB 13.1 11.2*  HCT 40.3 35.1*  MCV 91.6 91.2  PLT 103* 119*   Cardiac Enzymes:  Recent Labs Lab 04/21/16 1850 04/22/16 0015 04/22/16 0625  TROPONINI <0.03 <0.03 <0.03   BNP (last 3 results) No results for input(s): PROBNP in the last 8760 hours. CBG:  Recent Labs Lab 04/23/16 1233 04/23/16 1704 04/23/16 2139 04/24/16 0736 04/24/16 1137  GLUCAP 210* 148* 163* 153* 153*   D-Dimer: No results for input(s): DDIMER in the last 72 hours. Hgb A1c:  Recent Labs  04/21/16 1850 04/22/16 0625  HGBA1C 8.1* 8.1*   Lipid Profile: No results for input(s): CHOL, HDL, LDLCALC, TRIG, CHOLHDL, LDLDIRECT in the last 72 hours. Thyroid function studies: No results for input(s): TSH, T4TOTAL, T3FREE, THYROIDAB in the last 72 hours.  Invalid input(s): FREET3 Anemia work up: No results for input(s): VITAMINB12, FOLATE, FERRITIN, TIBC, IRON, RETICCTPCT in the last 72 hours. Sepsis Labs:  Recent Labs Lab 04/21/16 1348 04/21/16 1402 04/21/16 1850 04/22/16 0230 04/23/16 0439  WBC 8.0  --   --   --  6.8   LATICACIDVEN  --  2.73* 2.4* 1.7  --    Microbiology Recent Results (from the past 240 hour(s))  Culture, blood (routine x 2)     Status: None (Preliminary result)   Collection Time: 04/21/16  2:33 PM  Result Value Ref Range Status   Specimen Description BLOOD RIGHT HAND  Final   Special Requests BOTTLES DRAWN AEROBIC AND ANAEROBIC 5CC  Final   Culture   Final    NO GROWTH 3 DAYS Performed at [facility]    Report Status PENDING  Incomplete  Culture, blood (routine x 2)  Status: None (Preliminary result)   Collection Time: 04/21/16  2:43 PM  Result Value Ref Range Status   Specimen Description BLOOD LEFT HAND  Final   Special Requests IN PEDIATRIC BOTTLE [place]  Final   Culture   Final    NO GROWTH 3 DAYS Performed at [facility]    Report Status PENDING  Incomplete  Urine culture     Status: None   Collection Time: 04/21/16  9:32 PM  Result Value Ref Range Status   Specimen Description URINE, CLEAN CATCH  Final   Special Requests NONE  Final   Culture NO GROWTH Performed at [facility]   Final   Report Status 04/23/2016 FINAL  Final  Culture, sputum-assessment     Status: None   Collection Time: 04/21/16  9:40 PM  Result Value Ref Range Status   Specimen Description EXPECTORATED SPUTUM  Final   Special Requests Immunocompromised  Final   Sputum evaluation   Final    THIS SPECIMEN IS ACCEPTABLE. RESPIRATORY CULTURE REPORT TO FOLLOW.   Report Status 04/22/2016 FINAL  Final  Culture, respiratory (NON-Expectorated)     Status: None (Preliminary result)   Collection Time: 04/21/16  9:40 PM  Result Value Ref Range Status   Specimen Description SPUTUM  Final   Special Requests NONE  Final   Gram Stain   Final    FEW WBC PRESENT, PREDOMINANTLY PMN FEW GRAM POSITIVE COCCI IN CLUSTERS    Culture   Final    CULTURE REINCUBATED FOR BETTER GROWTH Performed at [facility]    Report Status PENDING  Incomplete  MRSA PCR Screening     Status:  None   Collection Time: 04/22/16 12:03 PM  Result Value Ref Range Status   MRSA by PCR NEGATIVE NEGATIVE Final    Comment:        The GeneXpert MRSA Assay (FDA approved for NASAL specimens only), is one component of a comprehensive MRSA colonization surveillance program. It is not intended to diagnose MRSA infection nor to guide or monitor treatment for MRSA infections.      Medications:   . albuterol  2.5 mg Nebulization BID  . ARIPiprazole  5 mg Oral QHS  . aspirin EC  81 mg Oral Daily  . atorvastatin  20 mg Oral Daily  . azithromycin  500 mg Intravenous Q24H  . ceFEPime (MAXIPIME) IV  1 g Intravenous Q8H  . clopidogrel  75 mg Oral Daily  . dexamethasone  4 mg Intravenous Q6H  . FLUoxetine  40 mg Oral q morning - 10a  . gabapentin  100 mg Oral Daily  . insulin aspart  0-15 Units Subcutaneous TID WC  . insulin aspart  0-5 Units Subcutaneous QHS  . insulin aspart  4 Units Subcutaneous TID WC  . insulin detemir  10 Units Subcutaneous BID  . isosorbide mononitrate  60 mg Oral Daily  . metoprolol succinate  12.5 mg Oral Daily  . mirtazapine  45 mg Oral QHS  . mometasone-formoterol  2 puff Inhalation BID  . morphine  30 mg Oral Q12H  . pantoprazole  40 mg Oral Daily  . potassium chloride SA  20 mEq Oral q morning - 10a  . predniSONE  5 mg Oral QAC breakfast  . roflumilast  500 mcg Oral Daily  . theophylline  400 mg Oral Daily  . tiotropium  18 mcg Inhalation Daily  . tobramycin-dexamethasone  2 drop Both Eyes Q6H   Continuous Infusions:  Time spent: 25 min   LOS: 3 days   [facility], [place]  [facility] Pager [phone]  *Please refer to [email or web address], password [email or web address] to get updated schedule on who will round on this patient, as hospitalists switch teams weekly. If 7PM-7AM, please contact night-coverage at [email or web address], password [email or web address] for any overnight needs.  04/24/2016, 12:58 PM

## 2016-04-24 NOTE — Care Management Important Message (Signed)
Important Message  Patient Details  Name: Ernest [name] MRN: [id number] Date of Birth: 1956/11/25   Medicare Important Message Given:  Yes    Camillo Flaming 04/24/2016, 10:12 AMImportant Message  Patient Details  Name: Ernest [name] MRN: [id number] Date of Birth: July 22, 1956   Medicare Important Message Given:  Yes    Camillo Flaming 04/24/2016, 10:12 AM

## 2016-04-24 NOTE — Care Management Note (Signed)
Case Management Note  Patient Details  Name: [name] MRN: [id number] Date of Birth: [birth year]  Subjective/Objective: [facility] chosen for [facility] rep left vm for referral.await HHRN, f76forder.Active w/[facility].                   Action/Plan:d/c plan home w/HHC.   Expected Discharge Date:                 Expected Discharge Plan:  [place] Referral:     Discharge planning Services  CM Consult  Post Acute Care Choice:   (Active w/[facility]) Choice offered to:  Patient  DME Arranged:    DME Agency:     [facility] [place] Status of Service:  In process, will continue to follow  Medicare Important Message Given:  Yes Date Medicare IM Given:    Medicare IM give by:    Date Additional Medicare IM Given:    Additional Medicare Important Message give by:     If discussed at [place] Meetings, dates discussed:    Additional Comments:  MDessa Phi RN 04/24/2016, 3:02 PM

## 2016-04-24 NOTE — Progress Notes (Signed)
Per the patient, he uses his home nebulizer at least 4 times per day. RT treatment assessment protocol completed. Orders changed according to acuity and home regimen. RT will continue to follow.

## 2016-04-25 DIAGNOSIS — J45909 Unspecified asthma, uncomplicated: Secondary | ICD-10-CM

## 2016-04-25 DIAGNOSIS — E119 Type 2 diabetes mellitus without complications: Secondary | ICD-10-CM

## 2016-04-25 DIAGNOSIS — J449 Chronic obstructive pulmonary disease, unspecified: Secondary | ICD-10-CM

## 2016-04-25 LAB — CULTURE, RESPIRATORY W GRAM STAIN

## 2016-04-25 LAB — CULTURE, RESPIRATORY

## 2016-04-25 LAB — GLUCOSE, CAPILLARY
GLUCOSE-CAPILLARY: 247 mg/dL — AB (ref 65–99)
GLUCOSE-CAPILLARY: 323 mg/dL — AB (ref 65–99)

## 2016-04-25 MED ORDER — LEVOFLOXACIN 750 MG PO TABS: 750.0000 mg | ORAL_TABLET | Freq: Every day | ORAL | Status: DC

## 2016-04-25 MED ORDER — GLIPIZIDE 5 MG PO TABS: 2.5000 mg | ORAL_TABLET | Freq: Every day | ORAL | Status: DC

## 2016-04-25 MED ORDER — PREDNISONE 10 MG PO TABS: ORAL_TABLET | ORAL | Status: DC

## 2016-04-25 MED ORDER — PREDNISONE 5 MG PO TABS: 5.0000 mg | ORAL_TABLET | Freq: Every day | ORAL | Status: DC

## 2016-04-25 MED ORDER — FLORA-Q PO CAPS: 1.0000 | ORAL_CAPSULE | Freq: Every day | ORAL | Status: DC

## 2016-04-25 NOTE — Discharge Summary (Signed)
Ernest [name], is a 60 y.o. male  DOB Aug 05, 1956  MRN [id number].  Admission date:  04/21/2016  Admitting Physician  Lily Kocher, MD  Discharge Date:  04/25/2016   Primary MD  [name], MD  Recommendations for primary care physician for things to follow:  - Patient glycohemoglobin A1c is 8.1, started on low-dose glipizide, adjust as needed - Patient to follow with oncology [name] as an outpatient   Admission Diagnosis  HCAP (healthcare-associated pneumonia) [J18.9]   Discharge Diagnosis  HCAP (healthcare-associated pneumonia) [J18.9]    Principal Problem:   HCAP (healthcare-associated pneumonia) Active Problems:   Essential hypertension   COPD with asthma ([place])   Squamous cell lung cancer ([place])   Chronic respiratory failure with hypoxia ([place])   Diabetes mellitus without complication ([place])   Coronary artery disease involving native coronary artery of native heart without angina pectoris   Sepsis due to pneumonia ([place])   Thrombocytopenia ([place])      Past Medical History  Diagnosis Date  . CAD (coronary artery disease)     Left Main 30% stenosis, LAD 20 - 30 % stenosis, first and second diagonal branchesat 40 - 50%  stenosis with small arteries, circumflex had 30% stenosis in the large obtuse marginal, RCA at 70 - 80%  stenosis [not felt to be occlusive after evaluation with flow wire], distal 50 - 60% stenosis - [name][  . COPD (chronic obstructive pulmonary disease) (HCC)     Dr. Baird Lyons  . Depression   . Anxiety   . Hyperlipidemia   . Chronic insomnia   . Gout   . GERD (gastroesophageal reflux disease)   . PVD (peripheral vascular disease) (HCC)     PTA/Stent right common iliac  . DDD (degenerative disc disease)   . Hx of colonoscopy   . COPD with asthma ([place]) 09/08/2007  . OSA (obstructive sleep apnea)     NPSG 09/10/10- AHI 11.3/hr  . Hypertension    dr Percival Spanish  . History of radiation therapy 11/10/13- 12/29/13    left lung 6600 cGy in 33 sessions  . Diabetes mellitus without complication ([place]) 9/48/5462  . Cancer ([place])     lung  . Lung cancer ([place]) 10/04/13    LUL squamous cell lung cancer  . History of cardiovascular stress test     Myoview 7/16:  Diaphragmatic attenuation, no ischemia, EF 56%; Low Risk  . Itching due to drug 03/19/2016  . Nausea with vomiting 03/26/2016    Past Surgical History  Procedure Laterality Date  . Spinal fusion  03/05/2007    L4-L5  . Hip surgery      left 'bone graft taken"  . Arm surgery      left elbow  . Shoulder surgery      right  . C-spine surgery    . Angioplasty    . Video bronchoscopy with endobronchial navigation N/A 10/04/2013    Procedure: VIDEO BRONCHOSCOPY WITH ENDOBRONCHIAL NAVIGATION;  Surgeon: Collene Gobble, MD;  Location: [name];  Service: Thoracic;  Laterality: N/A;  . Back surgery      lower  . Anterior fusion cervical spine      cervical fusion  7 yrs ago ([facility])  . Colon surgery      '11 "Diverticulitis"  . Colonoscopy with propofol N/A 05/22/2015    Procedure: COLONOSCOPY WITH PROPOFOL;  Surgeon: Inda Castle, MD;  Location: [facility];  Service: Endoscopy;  Laterality: N/A;  . Peripheral vascular catheterization N/A 08/10/2015    Procedure: Lower Extremity Angiography;  Surgeon: Lorretta Harp, MD; Distal Ao OK, L-EIA stent OK, R-CIA 100% s/p overlapping 7 mm x 38 mm ICast stents, 50-60% R-CFA           History of present illness and  Hospital Course:     Kindly see H&P for history of present illness and admission details, please review complete Labs, Consult reports and Test reports for all details in brief  HPI  from the history and physical done on the day of admission 04/21/2016  HPI: Ernest [name] is a 60 y.o. gentleman with a history of NSCLC (first diagnosed in 2014, recurred in February of this year), CAD, COPD, PVD, and DM. He is currently  undergoing palliative chemotherapy with Abraxane, last dose on Tuesday. He feels that he has been sick since then. He has had cough productive of green sputum, sometimes blood tinged. He also has green nasal drainage. He has had increased headache and sore throat. He has had chills and night sweats. Temperature 99.5. He has had nausea, which is typical after chemotherapy, but no vomiting. He has had light-headedness but no syncope. He wears oxygen 3L [place] at baseline, and this has been stable. He has had a right-sided chest pain associated with shortness of breath. No edema. He has had worsening acid reflux.  ED Course: ED evaluation concerning for chest xray showing increased infiltrate in the RML. The patient also has an elevated lactic acid level with tachycardia and tachypnea, though no leukocytosis or documented fever. He is immunocompromised and high risk. Hospitalist asked to admit for sepsis secondary to post-obstructive HCAP.  Hospital Course   Sepsis [facility]) due to PNA: - Unclear whether it's community-acquired pneumonia versus postobstructive, CT scan of the chest with contrast And enlarging mass, Oncology is leaning more towards a community-acquired pneumonia as this mass was not there last Pet-CAT scan. -Initially on IV vancomycin and cefepime,  vancomycin stopped given negative MRSA PCR, and added azithromycin for CAP coverage, received total of 5 days of IV antibiotic during hospital stay, to continue with another 2 days of by mouth levofloxacin as an outpatient - Agent was significant wheezing initially, symptoms improved with IV steroids, we'll discharge on prednisone taper.  Chronic respiratory failure with hypoxia due to COPD: - Oxygen dependent, we'll discharge on steroid taper.  Squamous cell lung cancer [facility]): Appreciate Dr. Lew Dawes assistance, will follow with oncology as an outpatient  Diabetes mellitus without complication ([place]) hemoglobin A1c was 8.1. Treated  with Levemir and insulin sliding scale during hospital stay, discharged on low-dose glipizide   Coronary artery disease involving native coronary artery of native heart without angina pectoris: Chest pain and now resolved was probably due to pneumonia.  Thrombocytopenia ([place]): Improving. Likely to chemotherapy.  Chronic pain: Continue current home regimen.  Mood disturbances/history of insomnia: Continue Abilify Klonopin, Prozac and Remeron.    Discharge Condition:  stable   Follow UP  Follow-up Information    Follow up with [name], MD. Schedule an  appointment as soon as possible for a visit in 1 week.   Specialty:  Internal Medicine   Contact information:   [address] [address] [phone]       Follow up with Eilleen Kempf., MD.   Specialty:  Oncology   Contact information:   [place] [address] [phone]         Discharge Instructions  and  Discharge Medications    Discharge Instructions    Discharge instructions    Complete by:  As directed   Follow with Primary MD [name], MD in 7 days   Get CBC, CMP, 2 view Chest X ray checked  by Primary MD next visit.    Activity: As tolerated with Full fall precautions use walker/cane & assistance as needed   Disposition Home    Diet: Heart Healthy , carbohydrate modified , with feeding assistance and aspiration precautions.  For Heart failure patients - Check your Weight same time everyday, if you gain over 2 pounds, or you develop in leg swelling, experience more shortness of breath or chest pain, call your Primary MD immediately. Follow Cardiac Low Salt Diet and 1.5 lit/day fluid restriction.   On your next visit with your primary care physician please Get Medicines reviewed and adjusted.   Please request your Prim.MD to go over all Hospital Tests and Procedure/Radiological results at the follow up, please get all Hospital records sent to  your Prim MD by signing hospital release before you go home.   If you experience worsening of your admission symptoms, develop shortness of breath, life threatening emergency, suicidal or homicidal thoughts you must seek medical attention immediately by calling 911 or calling your MD immediately  if symptoms less severe.  You Must read complete instructions/literature along with all the possible adverse reactions/side effects for all the Medicines you take and that have been prescribed to you. Take any new Medicines after you have completely understood and accpet all the possible adverse reactions/side effects.   Do not drive, operating heavy machinery, perform activities at heights, swimming or participation in water activities or provide baby sitting services if your were admitted for syncope or siezures until you have seen by Primary MD or a Neurologist and advised to do so again.  Do not drive when taking Pain medications.    Do not take more than prescribed Pain, Sleep and Anxiety Medications  Special Instructions: If you have smoked or chewed Tobacco  in the last 2 yrs please stop smoking, stop any regular Alcohol  and or any Recreational drug use.  Wear Seat belts while driving.   Please note  You were cared for by a hospitalist during your hospital stay. If you have any questions about your discharge medications or the care you received while you were in the hospital after you are discharged, you can call the unit and asked to speak with the hospitalist on call if the hospitalist that took care of you is not available. Once you are discharged, your primary care physician will handle any further medical issues. Please note that NO REFILLS for any discharge medications will be authorized once you are discharged, as it is imperative that you return to your primary care physician (or establish a relationship with a primary care physician if you do not have one) for your aftercare needs so  that they can reassess your need for medications and monitor your lab values.     Increase activity  slowly    Complete by:  As directed             Medication List    STOP taking these medications        pantoprazole 40 MG tablet  Commonly known as:  PROTONIX      TAKE these medications        ADVAIR DISKUS 500-50 MCG/DOSE Aepb  Generic drug:  Fluticasone-Salmeterol  1 PUFF THEN RINSE MOUTH, TWICE DAILY MAINTENANCE     albuterol 108 (90 Base) MCG/ACT inhaler  Commonly known as:  VENTOLIN HFA  INHALE 2 PUFFS INTO THE LUNGS 4 TIMES DAILY AS NEEDED FOR WHEEZING     albuterol (2.5 MG/3ML) 0.083% nebulizer solution  Commonly known as:  PROVENTIL  INHALE 1 VIAL IN NEBULIZER EVERY 4 HOURS AS NEEDED FOR WHEEZING OR SHORTNESS OF BREATH     ARIPiprazole 5 MG tablet  Commonly known as:  ABILIFY  Take 1 tablet (5 mg total) by mouth at bedtime.     Armodafinil 250 MG tablet  Take 250 mg by mouth every morning.     aspirin 81 MG EC tablet  TAKE 1 TABLET BY MOUTH EVERY DAY     atorvastatin 20 MG tablet  Commonly known as:  LIPITOR  TAKE 1 TABLET EVERY DAY     BELSOMRA 20 MG Tabs  Generic drug:  Suvorexant  Take 1 tablet by mouth at bedtime as needed (for sleep). Reported on 03/29/2016     clonazePAM 1 MG tablet  Commonly known as:  KLONOPIN  Take 2 mg by mouth at bedtime as needed for anxiety. Reported on 01/16/2016     clopidogrel 75 MG tablet  Commonly known as:  PLAVIX  Take 1 tablet (75 mg total) by mouth daily.     DALIRESP 500 MCG Tabs tablet  Generic drug:  roflumilast  Take 500 mcg by mouth daily.     dextromethorphan-guaiFENesin 30-600 MG 12hr tablet  Commonly known as:  MUCINEX DM  Take 1 tablet by mouth 2 (two) times daily as needed for cough (congestion).     esomeprazole 40 MG capsule  Commonly known as:  NEXIUM  Take 40 mg by mouth daily.     FLORA-Q Caps capsule  Take 1 capsule by mouth daily.     FLUoxetine 40 MG capsule  Commonly known as:   PROZAC  Take 40 mg by mouth every morning.     gabapentin 100 MG capsule  Commonly known as:  NEURONTIN  Take 100 mg by mouth daily.     glipiZIDE 5 MG tablet  Commonly known as:  GLUCOTROL  Take 0.5 tablets (2.5 mg total) by mouth daily before breakfast.     isosorbide mononitrate 60 MG 24 hr tablet  Commonly known as:  IMDUR  Take 60 mg by mouth daily.     levofloxacin 750 MG tablet  Commonly known as:  LEVAQUIN  Take 1 tablet (750 mg total) by mouth daily.  Start taking on:  04/26/2016     methocarbamol 500 MG tablet  Commonly known as:  ROBAXIN  Take 500 mg by mouth 2 (two) times daily as needed for muscle spasms. Reported on 01/16/2016     metoprolol succinate 25 MG 24 hr tablet  Commonly known as:  TOPROL-XL  TAKE 1/2 TABLET BY MOUTH DAILY     mirtazapine 45 MG tablet  Commonly known as:  REMERON  Take 1 tablet (45 mg total) by mouth at bedtime.  morphine 30 MG 12 hr tablet  Commonly known as:  MS CONTIN  Take 1 tablet (30 mg total) by mouth every 12 (twelve) hours.     morphine 15 MG tablet  Commonly known as:  MSIR  Take 1 tablet (15 mg total) by mouth every 4 (four) hours as needed for severe pain.     nitroGLYCERIN 0.4 MG SL tablet  Commonly known as:  NITROSTAT  Place 1 tablet (0.4 mg total) under the tongue every 5 (five) minutes as needed for chest pain.     ondansetron 8 MG tablet  Commonly known as:  ZOFRAN  TAKE 1 TABLET (8 MG TOTAL) BY MOUTH EVERY 8 (EIGHT) HOURS AS NEEDED FOR NAUSEA OR VOMITING.     OXYGEN  Inhale 2 L into the lungs at bedtime as needed.     potassium chloride SA 20 MEQ tablet  Commonly known as:  K-DUR,KLOR-CON  Take 1 tablet (20 mEq total) by mouth every morning. Reported on 12/07/2015     predniSONE 5 MG tablet  Commonly known as:  DELTASONE  Take 1 tablet (5 mg total) by mouth daily. Please resume after she steroid taper is finished over after 9 days     predniSONE 10 MG tablet  Commonly known as:  DELTASONE  Please  use 40 mg oral for 3 days, then 30 mg oral for 3 days, then 10 mg oral for 3 days, then go back to [place] prednisone of 5 mg oral daily.     prochlorperazine 10 MG tablet  Commonly known as:  COMPAZINE  TAKE 1 TABLET EVERY 6 HOURS AS NEEDED FOR NAUSEA AND VOMITING     theophylline 400 MG 24 hr tablet  Commonly known as:  UNIPHYL  TAKE 1 TABLET EVERY DAY     Tiotropium Bromide Monohydrate 1.25 MCG/ACT Aers  Commonly known as:  SPIRIVA RESPIMAT  Inhale 2 puffs into the lungs daily.     tobramycin-dexamethasone ophthalmic solution  Commonly known as:  TOBRADEX  Place 2 drops into both eyes every 6 (six) hours.          Diet and Activity recommendation: See Discharge Instructions above   Consults obtained -  None   Major procedures and Radiology Reports - PLEASE review detailed and final reports for all details, in brief -      Dg Chest 2 View  04/21/2016  CLINICAL DATA:  60 year old male with increasing shortness of breath and sore throat. History of lung cancer, undergoing chemotherapy. If EXAM: CHEST  2 VIEW COMPARISON:  Chest x-ray 04/16/2016. FINDINGS: Again noted is a pulmonary mass in the left lower lobe, estimated to measure approximately 3.0 x 3.3 x 3.0 cm. There is some chronic scarring and volume loss right middle lobe, as evidenced by downward displacement of the minor fissure. Some ill-defined opacity in the right middle lobe is also noted, which may suggest some developing airspace consolidation. Small right and trace left pleural effusions. No evidence of pulmonary edema. Chronic pleuroparenchymal thickening in the apex of the left hemithorax, very similar to prior examination. Within this area there appears to be a 2.6 cm cavity, also similar to priors. Heart size is normal. The patient is rotated to the left on today's exam, resulting in distortion of the mediastinal contours and reduced diagnostic sensitivity and specificity for mediastinal pathology. Orthopedic fixation  hardware in the lower cervical spine incidentally noted. IMPRESSION: 1. Worsening atelectasis and airspace consolidation in the right middle lobe, concerning for developing right middle  lobe pneumonia. 2. Right lower lobe pulmonary mass, similar to recent prior examinations, but more prominent than remote prior studies, suggesting progression of disease. 3. Small right and trace left pleural effusions. 4. Chronic scarring and cavitation in the left apex again noted. Electronically Signed   By: Vinnie Langton M.D.   On: 04/21/2016 13:19   Dg Chest 2 View  04/16/2016  CLINICAL DATA:  Followup pneumonia. Continued wheezing. COPD. History of lung cancer. EXAM: CHEST  2 VIEW COMPARISON:  03/30/2016 FINDINGS: Improving right lower lobe airspace opacity. Mild residual airspace opacity in the right lower lobe. Stable left apical density and posterior rounded density seen on the lateral view. Heart is normal size. No pleural effusions or acute bony abnormality. IMPRESSION: Improving right lower lobe pneumonia. Mild residual out pneumonia/ airspace opacity persists. This could be followed with repeat chest x-ray in 2-4 weeks to ensure continued clearance. Stable left apical pleural/ parenchymal density and rounded nodular density posteriorly on the lateral view. Electronically Signed   By: Rolm Baptise M.D.   On: 04/16/2016 13:28   Dg Chest 2 View  03/30/2016  CLINICAL DATA:  Patient with worsening shortness of breath. EXAM: CHEST  2 VIEW COMPARISON:  Chest radiograph 02/06/2016 FINDINGS: Anterior cervical spinal fusion hardware. Stable enlarged cardiac and mediastinal contours. Interval development of consolidation within the right mid and lower lung. Small right pleural effusion. Re- demonstrated left apical consolidation. Mid thoracic spine degenerative changes. Right lower lobe nodule appears have increased in size as well, demonstrated best on lateral view. IMPRESSION: Interval development of consolidation within  the right lower lobe concerning for pneumonia in the appropriate clinical setting. There is an associated new small right pleural effusion. Unchanged left apical pulmonary opacity. Interval increase in size of right lower lobe nodule. Electronically Signed   By: Lovey Newcomer M.D.   On: 03/30/2016 10:34   Ct Chest W Contrast  04/22/2016  CLINICAL DATA:  Cough. Current history of non-small-cell lung cancer. EXAM: CT CHEST WITH CONTRAST TECHNIQUE: Multidetector CT imaging of the chest was performed during intravenous contrast administration. CONTRAST:  40m ISOVUE-300 IOPAMIDOL (ISOVUE-300) INJECTION 61% COMPARISON:  PET scan of January 23, 2016. FINDINGS: There is no evidence of pneumothorax. Right lower lobe mass measuring 33 x 26 mm is noted which is significantly increased in size compared to prior exam, consistent with metastatic lesion. Stable fibrosis and scarring is noted in left lung apex most consistent with radiation fibrosis. Coronary artery calcifications are noted. Stable 7 mm subpleural nodule is noted laterally in left lower lobe best seen on image number 90 of series 5. Given the lack of change this most likely is of benign etiology, but metastatic disease cannot be excluded. Mild right pleural effusion is noted with adjacent subsegmental atelectasis. New consolidation is noted in the right middle lobe consistent with pneumonia, atelectasis or possibly fibrosis. There appears to be narrowing of the bronchi in this area, concerning for neoplasm or malignancy. 23 x 8 mm right peritracheal lymph node is noted which is increased compared to prior exam and concerning for metastatic disease. 8 mm right peritracheal lymph node is noted more inferiorly which is not significantly changed compared to prior exam. 18 x 12 mm sub carinal lymph node is noted which is increased compared to prior exam. Multiple enlarged lymph nodes are noted adjacent to the descending thoracic aorta with the largest measuring 11 x  10 mm. This is increased compared to prior exam. Within the visualized portion of the upper abdomen,  stable cystic lesion is noted in the spleen. No other abnormality is noted. No significant osseous abnormality is noted. IMPRESSION: 33 x 26 mm right lower lobe mass is noted which is significantly increased in size compared to prior exam, consistent with worsening metastatic lesion. Stable probable post radiation fibrosis and scarring is noted in left lung apex. Coronary artery calcifications are noted suggesting coronary artery disease. Stable 7 mm subpleural nodule is noted laterally in left lower lobe. This most likely is benign in etiology given the lack of change, but metastatic disease cannot be excluded. Mediastinal adenopathy is noted as described above, most of which is increased in size and concerning for metastatic disease. Mild right pleural effusion is noted with adjacent subsegmental atelectasis. New opacity is noted in right middle lobe most consistent with postobstructive pneumonia or atelectasis given the narrowing of the central bronchi in this area. This is concerning for possible neoplasm or malignancy, and bronchoscopy is recommended for further evaluation. Electronically Signed   By: Marijo Conception, M.D.   On: 04/22/2016 14:48    Micro Results    Recent Results (from the past 240 hour(s))  Culture, blood (routine x 2)     Status: None (Preliminary result)   Collection Time: 04/21/16  2:33 PM  Result Value Ref Range Status   Specimen Description BLOOD RIGHT HAND  Final   Special Requests BOTTLES DRAWN AEROBIC AND ANAEROBIC 5CC  Final   Culture   Final    NO GROWTH 3 DAYS Performed at [facility]    Report Status PENDING  Incomplete  Culture, blood (routine x 2)     Status: None (Preliminary result)   Collection Time: 04/21/16  2:43 PM  Result Value Ref Range Status   Specimen Description BLOOD LEFT HAND  Final   Special Requests IN PEDIATRIC BOTTLE [place]  Final    Culture   Final    NO GROWTH 3 DAYS Performed at [facility]    Report Status PENDING  Incomplete  Urine culture     Status: None   Collection Time: 04/21/16  9:32 PM  Result Value Ref Range Status   Specimen Description URINE, CLEAN CATCH  Final   Special Requests NONE  Final   Culture NO GROWTH Performed at [facility]   Final   Report Status 04/23/2016 FINAL  Final  Culture, sputum-assessment     Status: None   Collection Time: 04/21/16  9:40 PM  Result Value Ref Range Status   Specimen Description EXPECTORATED SPUTUM  Final   Special Requests Immunocompromised  Final   Sputum evaluation   Final    THIS SPECIMEN IS ACCEPTABLE. RESPIRATORY CULTURE REPORT TO FOLLOW.   Report Status 04/22/2016 FINAL  Final  Culture, respiratory (NON-Expectorated)     Status: None (Preliminary result)   Collection Time: 04/21/16  9:40 PM  Result Value Ref Range Status   Specimen Description SPUTUM  Final   Special Requests NONE  Final   Gram Stain   Final    FEW WBC PRESENT, PREDOMINANTLY PMN FEW GRAM POSITIVE COCCI IN CLUSTERS Performed at [place]  Final   Report Status PENDING  Incomplete  MRSA PCR Screening     Status: None   Collection Time: 04/22/16 12:03 PM  Result Value Ref Range Status   MRSA by PCR NEGATIVE NEGATIVE Final    Comment:        The GeneXpert MRSA Assay (FDA approved  for NASAL specimens only), is one component of a comprehensive MRSA colonization surveillance program. It is not intended to diagnose MRSA infection nor to guide or monitor treatment for MRSA infections.        Today   Subjective:   Tawanna Solo today has no headache,no chest or  abdominal pain,  dyspnea and cough improving, feels much better wants to go home today.   Objective:   Blood pressure 124/68, pulse 67, temperature 98 F (36.7 C), temperature source Oral, resp. rate 20, height '5\' 7"'$  (1.702 m), weight  89.495 kg (197 lb 4.8 oz), SpO2 97 %.   Intake/Output Summary (Last 24 hours) at 04/25/16 1108 Last data filed at 04/25/16 0745  Gross per 24 hour  Intake    950 ml  Output   1550 ml  Net   -600 ml    Exam General exam: In no acute distress. Respiratory system: Good air movement , Right midlung rales, scattered wheezing. Cardiovascular system: S1 & S2 heard, RRR.  Gastrointestinal system: Abdomen is nondistended, soft and nontender.  Central nervous system: Alert and oriented. No focal neurological deficits. Extremities: No pedal edema. Skin: No rashes, lesions or ulcers Psychiatry: Judgement and insight appear normal. Mood & affect appropriate.   Data Review   CBC w Diff: Lab Results  Component Value Date   WBC 6.8 04/23/2016   WBC 10.4* 04/16/2016   HGB 11.2* 04/23/2016   HGB 11.7* 04/16/2016   HCT 35.1* 04/23/2016   HCT 36.7* 04/16/2016   PLT 119* 04/23/2016   PLT 98* 04/16/2016   LYMPHOPCT 7 04/21/2016   LYMPHOPCT 4.6* 04/16/2016   MONOPCT 4 04/21/2016   MONOPCT 4.0 04/16/2016   EOSPCT 1 04/21/2016   EOSPCT 0.6 04/16/2016   BASOPCT 0 04/21/2016   BASOPCT 0.7 04/16/2016    CMP: Lab Results  Component Value Date   NA 133* 04/21/2016   NA 140 04/16/2016   K 3.5 04/21/2016   K 4.0 04/16/2016   CL 98* 04/21/2016   CO2 26 04/21/2016   CO2 31* 04/16/2016   BUN 11 04/21/2016   BUN 10.0 04/16/2016   CREATININE 0.67 04/23/2016   CREATININE 0.8 04/16/2016   CREATININE 0.80 08/07/2015   PROT 6.3* 04/21/2016   PROT 6.0* 04/16/2016   ALBUMIN 3.5 04/21/2016   ALBUMIN 3.5 04/16/2016   BILITOT 1.0 04/21/2016   BILITOT 0.43 04/16/2016   ALKPHOS 77 04/21/2016   ALKPHOS 102 04/16/2016   AST 16 04/21/2016   AST 17 04/16/2016   ALT 18 04/21/2016   ALT 28 04/16/2016  .   Total Time in preparing paper work, data evaluation and todays exam - 35 minutes  Kizzi Overbey M.D on 04/25/2016 at 11:08 AM  [facility]   Office  [phone]

## 2016-04-25 NOTE — Progress Notes (Signed)
Inpatient Diabetes Program Recommendations  AACE/ADA: New Consensus Statement on Inpatient Glycemic Control (2015)  Target Ranges:  Prepandial:   less than 140 mg/dL      Peak postprandial:   less than 180 mg/dL (1-2 hours)      Critically ill patients:  140 - 180 mg/dL   Lab Results  Component Value Date   GLUCAP 247* 04/25/2016   HGBA1C 8.1* 04/22/2016    Review of Glycemic Control  Inpatient Diabetes Program Recommendations:  Insulin - Basal: consider increasing Levemir to 12 units BID Thank you  Raoul Pitch MSN, RN,CDE Inpatient Diabetes Coordinator [phone] (team pager)

## 2016-04-25 NOTE — Care Management Note (Signed)
Case Management Note  Patient Details  Name: Ernest [name] MRN: [id number] Date of Birth: 11/28/1955  Subjective/Objective:  [facility] rep Santiago Glad aware of d/c & HHC orders. No further d/c needs.                  Action/Plan:d/c home w/HHC.   Expected Discharge Date:                 Expected Discharge Plan:  [place]  In-House Referral:     Discharge planning Services  CM Consult  Post Acute Care Choice:   (Active w/[facility]) Choice offered to:  Patient  DME Arranged:    DME Agency:     HH Arranged:  RN [place] Agency:  [place]  Status of Service:  Completed, signed off  Medicare Important Message Given:  Yes Date Medicare IM Given:    Medicare IM give by:    Date Additional Medicare IM Given:    Additional Medicare Important Message give by:     If discussed at [place] of Stay Meetings, dates discussed:    Additional Comments:  Dessa Phi, RN 04/25/2016, 12:40 PM

## 2016-04-25 NOTE — Consult Note (Signed)
   [facility] CM Inpatient Consult   04/25/2016  Ernest [name] 03-29-1956 [id number]   [facility] Care Management follow up visit. Ernest [name] endorses he his discharging home today. Inpatient RNCM confirms he will have home health services as well thru [name]. Support and encouragement given to Ernest [name]. He states he feels pretty confident in discharging home today. Appreciative of visit. Will alert [facility] RNCM of Ernest [name]'s plan for discharge home today.    Marthenia Rolling, MSN-Ed, RN,BSN [facility] Liaison [phone]

## 2016-04-25 NOTE — Progress Notes (Signed)
Patient discharged home with wife, discharge instructions given and explained to patient, he verbalized understanding, denies any pain/distress, no wound noted. accompanied home by wife, transported to the car by staff.

## 2016-04-25 NOTE — Discharge Instructions (Signed)
Follow with Primary MD [name], MD in 7 days   Get CBC, CMP, 2 view Chest X ray checked  by Primary MD next visit.    Activity: As tolerated with Full fall precautions use walker/cane & assistance as needed   Disposition Home    Diet: Heart Healthy , carbohydrate modified , with feeding assistance and aspiration precautions.  For Heart failure patients - Check your Weight same time everyday, if you gain over 2 pounds, or you develop in leg swelling, experience more shortness of breath or chest pain, call your Primary MD immediately. Follow Cardiac Low Salt Diet and 1.5 lit/day fluid restriction.   On your next visit with your primary care physician please Get Medicines reviewed and adjusted.   Please request your Prim.MD to go over all Hospital Tests and Procedure/Radiological results at the follow up, please get all Hospital records sent to your Prim MD by signing hospital release before you go home.   If you experience worsening of your admission symptoms, develop shortness of breath, life threatening emergency, suicidal or homicidal thoughts you must seek medical attention immediately by calling 911 or calling your MD immediately  if symptoms less severe.  You Must read complete instructions/literature along with all the possible adverse reactions/side effects for all the Medicines you take and that have been prescribed to you. Take any new Medicines after you have completely understood and accpet all the possible adverse reactions/side effects.   Do not drive, operating heavy machinery, perform activities at heights, swimming or participation in water activities or provide baby sitting services if your were admitted for syncope or siezures until you have seen by Primary MD or a Neurologist and advised to do so again.  Do not drive when taking Pain medications.    Do not take more than prescribed Pain, Sleep and Anxiety Medications  Special Instructions: If you have smoked or  chewed Tobacco  in the last 2 yrs please stop smoking, stop any regular Alcohol  and or any Recreational drug use.  Wear Seat belts while driving.   Please note  You were cared for by a hospitalist during your hospital stay. If you have any questions about your discharge medications or the care you received while you were in the hospital after you are discharged, you can call the unit and asked to speak with the hospitalist on call if the hospitalist that took care of you is not available. Once you are discharged, your primary care physician will handle any further medical issues. Please note that NO REFILLS for any discharge medications will be authorized once you are discharged, as it is imperative that you return to your primary care physician (or establish a relationship with a primary care physician if you do not have one) for your aftercare needs so that they can reassess your need for medications and monitor your lab values.

## 2016-04-26 ENCOUNTER — Other Ambulatory Visit: Payer: Self-pay | Admitting: *Deleted

## 2016-04-26 ENCOUNTER — Other Ambulatory Visit: Payer: Self-pay | Admitting: Internal Medicine

## 2016-04-26 LAB — CULTURE, BLOOD (ROUTINE X 2)
CULTURE: NO GROWTH
Culture: NO GROWTH

## 2016-04-26 NOTE — Patient Outreach (Signed)
[facility]) Care Management [place], Telephone Outreach, Transition of Care, day 1 04/26/2016  Ernest [name] 1956-04-02 [id number]  Ernest [name] Ernest [name]) is an 60 y.o. male followed by [place] for recent transition of care after recent inpatient hospital visit, as well as ongoing self management of chronic disease state of COPD. Ernest [name] has lung cancer and recently started a new round of chemotherapy.  Ernest [name] was recently admitted May 28-April 25, 2016 for increased shortness of breath/ dyspnea; it was determined that he had healthcare associated pneumonia.  Ernest [name] is now followed by [place] for transition of care after most recent hospital discharge on April 25, 2016.  Today, Ernest [name] reports that he is feeling good and "doing better," after his recent hospital admission and discharge yesterday, stating the he "slept pretty good" in his "own bed."  Ernest [name] reported that he has all of his medications, and is taking them as they are prescribed.  Ernest [name] was recently discharged from hospital and all medications were reviewed today during telephone call.  Ernest [name] stated that home health has contacted him and confirmed that he has the number to the home health company should he need it.  Difference between home health services and [place] discussed with Ernest [name] today during our telephone call.  Ernest [name] verbalized an accurate report of his provider follow up appointments today and confirmed that he plans to attend each scheduled appointment.  Ernest [name] stated that [name] visited him while he was in the hospital, and told him that his "cancer was growing despite the chemotherapy."  Ernest [name] stated that the general plan with his oncologist is to "try different chemo."  Ernest [name] reports that he has continued using his home oxygen 100% of the time, "between 3 and 4 liters/ minute."  Ernest [name] reports that his SaO2 level this morning was 96%.  He denies unusual  shortness of breath outside of his baseline this morning.   Plan:  Ernest [name] will take his medications as prescribed and keep all scheduled provider appointments.  Ernest [name] will work with home health nursing and PT services as ordered post-hospital discharge.  Ernest [name] will call his providers for any new concerns, issues, questions or problems.  [place] telephone outreach for transition of care scheduled for next week.  [name], RN, BSN, [employer] [facility] Care Management  [phone]

## 2016-05-01 ENCOUNTER — Ambulatory Visit: Payer: Self-pay | Admitting: *Deleted

## 2016-05-01 ENCOUNTER — Other Ambulatory Visit: Payer: Self-pay | Admitting: *Deleted

## 2016-05-01 NOTE — Patient Outreach (Signed)
[place] [facility]) Care Management Ernest [name] Telephone Outreach, Transition of Care, Day 6 05/01/2016  Ernest [name] Mar 14, 1956 [id number]  [name] [name]) is an 60 y.o. male followed by [place] for recent transition of care after recent inpatient hospital visit, as well as ongoing self management of chronic disease state of COPD. [name] has lung cancer and recently started a new round of chemotherapy. [name] was recently admitted May 28-April 25, 2016 for increased shortness of breath/ dyspnea; it was determined that he had healthcare associated pneumonia. [name] is now followed by [place] for transition of care after most recent hospital discharge on April 25, 2016.  HIPAA compliant voicemail message let for patient, asking him to return my call.  Plan: Will re-attempt [facility] CM later this week if I do not hear back from patient.   [name], RN, BSN, [employer] [facility] Care Management  [phone]

## 2016-05-03 ENCOUNTER — Other Ambulatory Visit: Payer: Self-pay | Admitting: *Deleted

## 2016-05-03 ENCOUNTER — Ambulatory Visit: Payer: Self-pay | Admitting: *Deleted

## 2016-05-03 DIAGNOSIS — J449 Chronic obstructive pulmonary disease, unspecified: Secondary | ICD-10-CM | POA: Diagnosis not present

## 2016-05-03 NOTE — Patient Outreach (Signed)
[place] [facility]) Care Management     [place] Telephone Outreach, Transition of Care, Day 8 05/03/2016  Shanta Dorvil Akhtar [birth year] [id number]   Unsuccessful telephone outreach to Ernest [name] [place] [name]), a 60 y.o. male followed by [name] for recent transition of care after recent inpatient hospital visit, as well as ongoing self management of chronic disease state of COPD. [name] has lung cancer and recently started a new round of chemotherapy. [name] was recently admitted May 28-April 25, 2016 for increased shortness of breath/ dyspnea; it was determined that he had healthcare associated pneumonia. [name] is now followed by [name] for transition of care after most recent hospital discharge on April 25, 2016.  HIPAA compliant voicemail message left for patient on both of his contact phone numbers, asking him to return my call.  On voicemail messages, I shared my upcoming schedule, and informed patient that another [name] RN CM would be contacting him next week with telephone outreach for ongoing transition of care, and reminded patient of our previously scheduled [place] in-home visit for May 13, 2016.  Plan: [place] telephone outreach for ongoing transition of care next week.   [facility] CM in-home visit scheduled for May 13, 2016  [name], RN, BSN, [place] Care Management  [phone]

## 2016-05-06 ENCOUNTER — Ambulatory Visit (HOSPITAL_COMMUNITY): Payer: Commercial Managed Care - HMO

## 2016-05-06 ENCOUNTER — Other Ambulatory Visit: Payer: Self-pay | Admitting: Internal Medicine

## 2016-05-06 ENCOUNTER — Other Ambulatory Visit: Payer: Self-pay | Admitting: Acute Care

## 2016-05-07 ENCOUNTER — Ambulatory Visit (INDEPENDENT_AMBULATORY_CARE_PROVIDER_SITE_OTHER): Payer: Commercial Managed Care - HMO | Admitting: Internal Medicine

## 2016-05-07 ENCOUNTER — Encounter: Payer: Self-pay | Admitting: Internal Medicine

## 2016-05-07 ENCOUNTER — Telehealth: Payer: Self-pay | Admitting: Internal Medicine

## 2016-05-07 ENCOUNTER — Ambulatory Visit (HOSPITAL_BASED_OUTPATIENT_CLINIC_OR_DEPARTMENT_OTHER): Payer: Commercial Managed Care - HMO | Admitting: Internal Medicine

## 2016-05-07 ENCOUNTER — Other Ambulatory Visit (HOSPITAL_BASED_OUTPATIENT_CLINIC_OR_DEPARTMENT_OTHER): Payer: Commercial Managed Care - HMO

## 2016-05-07 ENCOUNTER — Ambulatory Visit: Payer: Commercial Managed Care - HMO

## 2016-05-07 VITALS — BP 133/68 | HR 80 | Temp 97.8°F | Resp 17 | Ht 67.0 in | Wt 193.6 lb

## 2016-05-07 VITALS — BP 110/60 | HR 60 | Temp 98.3°F | Resp 20 | Ht 67.0 in | Wt 195.0 lb

## 2016-05-07 DIAGNOSIS — J189 Pneumonia, unspecified organism: Secondary | ICD-10-CM

## 2016-05-07 DIAGNOSIS — E119 Type 2 diabetes mellitus without complications: Secondary | ICD-10-CM

## 2016-05-07 DIAGNOSIS — Z5189 Encounter for other specified aftercare: Secondary | ICD-10-CM | POA: Diagnosis not present

## 2016-05-07 DIAGNOSIS — C3491 Malignant neoplasm of unspecified part of right bronchus or lung: Secondary | ICD-10-CM

## 2016-05-07 DIAGNOSIS — J4489 Other specified chronic obstructive pulmonary disease: Secondary | ICD-10-CM

## 2016-05-07 DIAGNOSIS — J449 Chronic obstructive pulmonary disease, unspecified: Secondary | ICD-10-CM | POA: Diagnosis not present

## 2016-05-07 DIAGNOSIS — C3431 Malignant neoplasm of lower lobe, right bronchus or lung: Secondary | ICD-10-CM

## 2016-05-07 DIAGNOSIS — Z5112 Encounter for antineoplastic immunotherapy: Secondary | ICD-10-CM | POA: Insufficient documentation

## 2016-05-07 DIAGNOSIS — J45909 Unspecified asthma, uncomplicated: Secondary | ICD-10-CM

## 2016-05-07 DIAGNOSIS — C3492 Malignant neoplasm of unspecified part of left bronchus or lung: Secondary | ICD-10-CM

## 2016-05-07 LAB — CBC WITH DIFFERENTIAL/PLATELET
BASO%: 0.6 % (ref 0.0–2.0)
BASOS ABS: 0.1 10*3/uL (ref 0.0–0.1)
EOS ABS: 0.1 10*3/uL (ref 0.0–0.5)
EOS%: 0.6 % (ref 0.0–7.0)
HCT: 41.7 % (ref 38.4–49.9)
HEMOGLOBIN: 13.3 g/dL (ref 13.0–17.1)
LYMPH%: 3.2 % — ABNORMAL LOW (ref 14.0–49.0)
MCH: 29.1 pg (ref 27.2–33.4)
MCHC: 31.8 g/dL — ABNORMAL LOW (ref 32.0–36.0)
MCV: 91.2 fL (ref 79.3–98.0)
MONO#: 0.8 10*3/uL (ref 0.1–0.9)
MONO%: 5 % (ref 0.0–14.0)
NEUT%: 90.6 % — ABNORMAL HIGH (ref 39.0–75.0)
NEUTROS ABS: 14.7 10*3/uL — AB (ref 1.5–6.5)
PLATELETS: 113 10*3/uL — AB (ref 140–400)
RBC: 4.57 10*6/uL (ref 4.20–5.82)
RDW: 21.3 % — AB (ref 11.0–14.6)
WBC: 16.2 10*3/uL — AB (ref 4.0–10.3)
lymph#: 0.5 10*3/uL — ABNORMAL LOW (ref 0.9–3.3)

## 2016-05-07 LAB — COMPREHENSIVE METABOLIC PANEL
ALBUMIN: 3.6 g/dL (ref 3.5–5.0)
ALT: 22 U/L (ref 0–55)
ANION GAP: 8 meq/L (ref 3–11)
AST: 15 U/L (ref 5–34)
Alkaline Phosphatase: 93 U/L (ref 40–150)
BUN: 13 mg/dL (ref 7.0–26.0)
CALCIUM: 9.8 mg/dL (ref 8.4–10.4)
CO2: 30 meq/L — AB (ref 22–29)
CREATININE: 0.8 mg/dL (ref 0.7–1.3)
Chloride: 100 mEq/L (ref 98–109)
Glucose: 131 mg/dl (ref 70–140)
Potassium: 4.5 mEq/L (ref 3.5–5.1)
Sodium: 138 mEq/L (ref 136–145)
TOTAL PROTEIN: 6.5 g/dL (ref 6.4–8.3)
Total Bilirubin: 0.48 mg/dL (ref 0.20–1.20)

## 2016-05-07 LAB — TECHNOLOGIST REVIEW

## 2016-05-07 MED ORDER — PREDNISONE 5 MG PO TABS: 5.0000 mg | ORAL_TABLET | Freq: Every day | ORAL | Status: DC

## 2016-05-07 MED ORDER — GLIPIZIDE 5 MG PO TABS: 2.5000 mg | ORAL_TABLET | Freq: Every day | ORAL | Status: DC

## 2016-05-07 NOTE — Telephone Encounter (Signed)
Gave and printed appt sched and avs for pt for June thru Aug °

## 2016-05-07 NOTE — Progress Notes (Signed)
Pre visit review using our clinic review tool, if applicable. No additional management support is needed unless otherwise documented below in the visit note. 

## 2016-05-07 NOTE — Progress Notes (Signed)
Subjective:  Patient ID: Ernest [name], male    DOB: 06-25-1956  Age: 60 y.o. MRN: [id number]  CC: COPD and Cough   HPI Ernest [name] presents for Follow-up after recent admission for pneumonia that was treated with IV antibiotics. He was discharged and took 3 additional days of Levaquin. He tells me that he feels much better. His cough is to medically improved and is only productive of thin whitish phlegm. Shortness of breath and wheezing right it's a slide. His O2 sat today was 88% but that was without oxygen. He has had no fevers/chills/chest pain/night sweats/or hemoptysis. He continues to undergo evaluation and treatment of lung cancer with apparent metastatic disease.  Outpatient Prescriptions Prior to Visit  Medication Sig Dispense Refill  . ADVAIR DISKUS 500-50 MCG/DOSE AEPB 1 PUFF THEN RINSE MOUTH, TWICE DAILY MAINTENANCE 60 each 2  . albuterol (PROVENTIL) (2.5 MG/3ML) 0.083% nebulizer solution INHALE 1 VIAL IN NEBULIZER EVERY 4 HOURS AS NEEDED FOR WHEEZING OR SHORTNESS OF BREATH 300 mL 2  . albuterol (VENTOLIN HFA) 108 (90 Base) MCG/ACT inhaler INHALE 2 PUFFS INTO THE LUNGS 4 TIMES DAILY AS NEEDED FOR WHEEZING (Patient taking differently: Inhale 2 puffs into the lungs every 4 (four) hours as needed for wheezing. INHALE 2 PUFFS INTO THE LUNGS 4 TIMES DAILY AS NEEDED FOR WHEEZING) 18 Inhaler 11  . ARIPiprazole (ABILIFY) 5 MG tablet Take 1 tablet (5 mg total) by mouth at bedtime.    . Armodafinil 250 MG tablet Take 250 mg by mouth every morning.  5  . aspirin 81 MG EC tablet TAKE 1 TABLET BY MOUTH EVERY DAY 30 tablet 5  . atorvastatin (LIPITOR) 20 MG tablet TAKE 1 TABLET EVERY DAY (Patient taking differently: TAKE 1 TABLET EVERY DAY IN THE EVENING) 90 tablet 3  . BELSOMRA 20 MG TABS Take 1 tablet by mouth at bedtime as needed (for sleep). Reported on 03/29/2016  4  . clonazePAM (KLONOPIN) 1 MG tablet Take 2 mg by mouth at bedtime as needed for anxiety. Reported on 01/16/2016      . clopidogrel (PLAVIX) 75 MG tablet Take 1 tablet (75 mg total) by mouth daily. 90 tablet 3  . DALIRESP 500 MCG TABS tablet Take 500 mcg by mouth daily.     [name] dextromethorphan-guaiFENesin (MUCINEX DM) 30-600 MG 12hr tablet Take 1 tablet by mouth 2 (two) times daily as needed for cough (congestion).    [name] esomeprazole (NEXIUM) 40 MG capsule TAKE ONE CAPSULE BY MOUTH EVERY DAY 90 capsule 3  . FLORA-Q (FLORA-Q) CAPS capsule Take 1 capsule by mouth daily. 20 capsule 0  . FLUoxetine (PROZAC) 40 MG capsule Take 40 mg by mouth every morning.    . gabapentin (NEURONTIN) 100 MG capsule Take 100 mg by mouth daily.     . isosorbide mononitrate (IMDUR) 60 MG 24 hr tablet Take 60 mg by mouth daily.    . metoprolol succinate (TOPROL-XL) 25 MG 24 hr tablet TAKE 1/2 TABLET BY MOUTH DAILY 30 tablet 3  . mirtazapine (REMERON) 45 MG tablet Take 1 tablet (45 mg total) by mouth at bedtime.    [name] morphine (MS CONTIN) 30 MG 12 hr tablet Take 1 tablet (30 mg total) by mouth every 12 (twelve) hours. 60 tablet 0  . morphine (MSIR) 15 MG tablet Take 1 tablet (15 mg total) by mouth every 4 (four) hours as needed for severe pain. 30 tablet 0  . nitroGLYCERIN (NITROSTAT) 0.4 MG SL tablet Place 1 tablet (0.4  mg total) under the tongue every 5 (five) minutes as needed for chest pain. 25 tablet 3  . ondansetron (ZOFRAN) 8 MG tablet TAKE 1 TABLET (8 MG TOTAL) BY MOUTH EVERY 8 (EIGHT) HOURS AS NEEDED FOR NAUSEA OR VOMITING. 20 tablet 0  . OXYGEN Inhale 2 L into the lungs at bedtime as needed.    . potassium chloride SA (K-DUR,KLOR-CON) 20 MEQ tablet Take 1 tablet (20 mEq total) by mouth every morning. Reported on 12/07/2015 90 tablet 1  . prochlorperazine (COMPAZINE) 10 MG tablet TAKE 1 TABLET EVERY 6 HOURS AS NEEDED FOR NAUSEA AND VOMITING 30 tablet 0  . theophylline (UNIPHYL) 400 MG 24 hr tablet TAKE 1 TABLET EVERY DAY 30 tablet 5  . Tiotropium Bromide Monohydrate (SPIRIVA RESPIMAT) 1.25 MCG/ACT AERS Inhale 2 puffs into the lungs  daily. 4 g 11  . glipiZIDE (GLUCOTROL) 5 MG tablet Take 0.5 tablets (2.5 mg total) by mouth daily before breakfast. 30 tablet 0  . levofloxacin (LEVAQUIN) 750 MG tablet Take 1 tablet (750 mg total) by mouth daily. 2 tablet 0  . methocarbamol (ROBAXIN) 500 MG tablet Take 500 mg by mouth 2 (two) times daily as needed for muscle spasms. Reported on 01/16/2016    . tobramycin-dexamethasone (TOBRADEX) ophthalmic solution Place 2 drops into both eyes every 6 (six) hours. 5 mL 0  . predniSONE (DELTASONE) 10 MG tablet Please use 40 mg oral for 3 days, then 30 mg oral for 3 days, then 10 mg oral for 3 days, then go back to [place] prednisone of 5 mg oral daily. (Patient not taking: Reported on 05/07/2016) 24 tablet 0  . predniSONE (DELTASONE) 5 MG tablet Take 1 tablet (5 mg total) by mouth daily. Please resume after she steroid taper is finished over after 9 days (Patient not taking: Reported on 05/07/2016)     No facility-administered medications prior to visit.    ROS Review of Systems  Constitutional: Negative.  Negative for fever, chills, diaphoresis, appetite change and fatigue.  HENT: Negative.  Negative for congestion, facial swelling, sinus pressure, sore throat and trouble swallowing.   Eyes: Negative.  Negative for visual disturbance.  Respiratory: Positive for cough, shortness of breath and wheezing. Negative for apnea, choking, chest tightness and stridor.   Cardiovascular: Negative.  Negative for chest pain, palpitations and leg swelling.  Gastrointestinal: Negative for nausea, vomiting, abdominal pain and diarrhea.  Endocrine: Negative.   Genitourinary: Negative.  Negative for dysuria and difficulty urinating.  Musculoskeletal: Negative.   Skin: Negative.  Negative for color change and rash.  Allergic/Immunologic: Negative.   Neurological: Negative.  Negative for dizziness, tremors, weakness and light-headedness.  Hematological: Negative.  Negative for adenopathy. Does not bruise/bleed easily.    Psychiatric/Behavioral: Negative.     Objective:  BP 110/60 mmHg  Pulse 60  Temp(Src) 98.3 F (36.8 C) (Oral)  Resp 20  Ht '5\' 7"'$  (1.702 m)  Wt 195 lb (88.451 kg)  BMI 30.53 kg/m2  SpO2 88%  BP Readings from Last 3 Encounters:  05/07/16 133/68  05/07/16 110/60  04/25/16 119/55    Wt Readings from Last 3 Encounters:  05/07/16 193 lb 9.6 oz (87.816 kg)  05/07/16 195 lb (88.451 kg)  04/21/16 197 lb 4.8 oz (89.495 kg)    Physical Exam  Constitutional: He is oriented to person, place, and time. No distress.  HENT:  Mouth/Throat: Oropharynx is clear and moist. No oropharyngeal exudate.  Eyes: Conjunctivae are normal. Right eye exhibits no discharge. Left eye exhibits no discharge.  No scleral icterus.  Neck: Normal range of motion. Neck supple. No JVD present. No tracheal deviation present. No thyromegaly present.  Cardiovascular: Normal rate, regular rhythm, normal heart sounds and intact distal pulses.  Exam reveals no gallop and no friction rub.   No murmur heard. Pulmonary/Chest: Effort normal. No accessory muscle usage or stridor. No respiratory distress. He has decreased breath sounds in the right lower field and the left lower field. He has no wheezes. He has no rhonchi. He has no rales. He exhibits no tenderness.  Abdominal: Soft. Bowel sounds are normal. He exhibits no distension and no mass. There is no tenderness. There is no rebound and no guarding.  Musculoskeletal: Normal range of motion. He exhibits no edema or tenderness.  Lymphadenopathy:    He has no cervical adenopathy.  Neurological: He is oriented to person, place, and time.  Skin: Skin is warm and dry. No rash noted. He is not diaphoretic. No erythema. No pallor.  Vitals reviewed.   Lab Results  Component Value Date   WBC 16.2* 05/07/2016   HGB 13.3 05/07/2016   HCT 41.7 05/07/2016   PLT 113* 05/07/2016   GLUCOSE 131 05/07/2016   CHOL 164 10/05/2015   TRIG 174.0* 10/05/2015   HDL 48.70 10/05/2015    LDLDIRECT 71.4 09/08/2012   LDLCALC 80 10/05/2015   ALT 22 05/07/2016   AST 15 05/07/2016   NA 138 05/07/2016   K 4.5 05/07/2016   CL 98* 04/21/2016   CREATININE 0.8 05/07/2016   BUN 13.0 05/07/2016   CO2 30* 05/07/2016   TSH 2.353 08/07/2015   PSA 0.64 07/18/2011   INR 1.14 04/21/2016   HGBA1C 8.1* 04/22/2016   MICROALBUR 5.7* 10/05/2015    Dg Chest 2 View  04/21/2016  CLINICAL DATA:  60 year old male with increasing shortness of breath and sore throat. History of lung cancer, undergoing chemotherapy. If EXAM: CHEST  2 VIEW COMPARISON:  Chest x-ray 04/16/2016. FINDINGS: Again noted is a pulmonary mass in the left lower lobe, estimated to measure approximately 3.0 x 3.3 x 3.0 cm. There is some chronic scarring and volume loss right middle lobe, as evidenced by downward displacement of the minor fissure. Some ill-defined opacity in the right middle lobe is also noted, which may suggest some developing airspace consolidation. Small right and trace left pleural effusions. No evidence of pulmonary edema. Chronic pleuroparenchymal thickening in the apex of the left hemithorax, very similar to prior examination. Within this area there appears to be a 2.6 cm cavity, also similar to priors. Heart size is normal. The patient is rotated to the left on today's exam, resulting in distortion of the mediastinal contours and reduced diagnostic sensitivity and specificity for mediastinal pathology. Orthopedic fixation hardware in the lower cervical spine incidentally noted. IMPRESSION: 1. Worsening atelectasis and airspace consolidation in the right middle lobe, concerning for developing right middle lobe pneumonia. 2. Right lower lobe pulmonary mass, similar to recent prior examinations, but more prominent than remote prior studies, suggesting progression of disease. 3. Small right and trace left pleural effusions. 4. Chronic scarring and cavitation in the left apex again noted. Electronically Signed   By:  Vinnie Langton M.D.   On: 04/21/2016 13:19   Ct Chest W Contrast  04/22/2016  CLINICAL DATA:  Cough. Current history of non-small-cell lung cancer. EXAM: CT CHEST WITH CONTRAST TECHNIQUE: Multidetector CT imaging of the chest was performed during intravenous contrast administration. CONTRAST:  15m ISOVUE-300 IOPAMIDOL (ISOVUE-300) INJECTION 61% COMPARISON:  PET scan of  January 23, 2016. FINDINGS: There is no evidence of pneumothorax. Right lower lobe mass measuring 33 x 26 mm is noted which is significantly increased in size compared to prior exam, consistent with metastatic lesion. Stable fibrosis and scarring is noted in left lung apex most consistent with radiation fibrosis. Coronary artery calcifications are noted. Stable 7 mm subpleural nodule is noted laterally in left lower lobe best seen on image number 90 of series 5. Given the lack of change this most likely is of benign etiology, but metastatic disease cannot be excluded. Mild right pleural effusion is noted with adjacent subsegmental atelectasis. New consolidation is noted in the right middle lobe consistent with pneumonia, atelectasis or possibly fibrosis. There appears to be narrowing of the bronchi in this area, concerning for neoplasm or malignancy. 23 x 8 mm right peritracheal lymph node is noted which is increased compared to prior exam and concerning for metastatic disease. 8 mm right peritracheal lymph node is noted more inferiorly which is not significantly changed compared to prior exam. 18 x 12 mm sub carinal lymph node is noted which is increased compared to prior exam. Multiple enlarged lymph nodes are noted adjacent to the descending thoracic aorta with the largest measuring 11 x 10 mm. This is increased compared to prior exam. Within the visualized portion of the upper abdomen, stable cystic lesion is noted in the spleen. No other abnormality is noted. No significant osseous abnormality is noted. IMPRESSION: 33 x 26 mm right lower  lobe mass is noted which is significantly increased in size compared to prior exam, consistent with worsening metastatic lesion. Stable probable post radiation fibrosis and scarring is noted in left lung apex. Coronary artery calcifications are noted suggesting coronary artery disease. Stable 7 mm subpleural nodule is noted laterally in left lower lobe. This most likely is benign in etiology given the lack of change, but metastatic disease cannot be excluded. Mediastinal adenopathy is noted as described above, most of which is increased in size and concerning for metastatic disease. Mild right pleural effusion is noted with adjacent subsegmental atelectasis. New opacity is noted in right middle lobe most consistent with postobstructive pneumonia or atelectasis given the narrowing of the central bronchi in this area. This is concerning for possible neoplasm or malignancy, and bronchoscopy is recommended for further evaluation. Electronically Signed   By: Marijo Conception, M.D.   On: 04/22/2016 14:48    Assessment & Plan:   Leyton was seen today for copd and cough.  Diagnoses and all orders for this visit:  COPD with asthma ([place])- improvement noted, he will continue the current set of inhalers, he has just finished a prednisone taper, will continue prednisone at 5 mg a day -     predniSONE (DELTASONE) 5 MG tablet; Take 1 tablet (5 mg total) by mouth daily. Please resume after she steroid taper is finished over after 9 days  Squamous cell lung cancer, left ([place])- continue evaluation and monitoring by oncology -     predniSONE (DELTASONE) 5 MG tablet; Take 1 tablet (5 mg total) by mouth daily. Please resume after she steroid taper is finished over after 9 days  Diabetes mellitus without complication ([place])- recent A1c was 8.1%, he was started on a sulfonylurea, will continue that for now. -     glipiZIDE (GLUCOTROL) 5 MG tablet; Take 0.5 tablets (2.5 mg total) by mouth daily before breakfast.  HCAP  (healthcare-associated pneumonia)- he appears clinically improved, did not repeat a chest x-ray today, he will  let me know if any symptoms of infection recur.   I have discontinued Mr. Twombly's methocarbamol, tobramycin-dexamethasone, and levofloxacin. I am also having him maintain his clonazePAM, ARIPiprazole, nitroGLYCERIN, mirtazapine, FLUoxetine, gabapentin, atorvastatin, clopidogrel, potassium chloride SA, aspirin, BELSOMRA, isosorbide mononitrate, OXYGEN, dextromethorphan-guaiFENesin, albuterol, Armodafinil, metoprolol succinate, theophylline, albuterol, DALIRESP, ondansetron, prochlorperazine, morphine, morphine, Tiotropium Bromide Monohydrate, FLORA-Q, ADVAIR DISKUS, esomeprazole, predniSONE, and glipiZIDE.  Meds ordered this encounter  Medications  . predniSONE (DELTASONE) 5 MG tablet    Sig: Take 1 tablet (5 mg total) by mouth daily. Please resume after she steroid taper is finished over after 9 days    Dispense:  30 tablet    Refill:  1  . glipiZIDE (GLUCOTROL) 5 MG tablet    Sig: Take 0.5 tablets (2.5 mg total) by mouth daily before breakfast.    Dispense:  30 tablet    Refill:  5     Follow-up: Return in about 4 months (around 09/06/2016).  [name], MD

## 2016-05-07 NOTE — Progress Notes (Signed)
[place] [phone]   [phone]  OFFICE PROGRESS NOTE  Ernest Calico, MD [address] 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 The patient has recurrence in February 2017. PDL 1 expression 0%.  PRIOR THERAPY:  1) Concurrent chemoradiation with weekly carboplatin for AUC of 2 and paclitaxel 45 mg/M2, status post 7 cycles, last dose was given 12/20/2013. First dose on 11/01/2013. 2) Consolidation chemotherapy with carboplatin for AUC of 5 and paclitaxel 175 mg/M2 every 3 weeks with Neulasta support. First cycle on 02/08/2014. Status post 3 cycles and carboplatin was discontinued secondary to allergic reaction. 3) Abraxane 100 MG/M2 on days 1 and 8 every 3 weeks. Status post 3 cycles. Last dose was given 04/16/2016 discontinued secondary to disease progression.   CURRENT THERAPY: Nivolumab 240 MG IV every 2 weeks. First dose 05/14/2016.  CHEMOTHERAPY INTENT: Curative/control  CURRENT # OF CHEMOTHERAPY CYCLES: 1  CURRENT ANTIEMETICS: Zofran, dexamethasone and Compazine  CURRENT SMOKING STATUS: Former smoker  ORAL CHEMOTHERAPY AND CONSENT: None  CURRENT BISPHOSPHONATES USE: None  PAIN MANAGEMENT: 0/10  NARCOTICS INDUCED CONSTIPATION: None.  LIVING WILL AND CODE STATUS: Full code.   INTERVAL HISTORY: Ernest [name] 60 y.o. male returns to the clinic today for follow-up visit accompanied by his wife. The patient is feeling fine today with no specific complaints except for shortness of breath with exertion secondary to COPD. he was recently admitted to [facility] with questionable pneumonia. He was treated initially with IV vancomycin and  cefepime. Followed by azithromycin and outpatient treatment with Levaquin. He is feeling better and he enjoyed some time at the beach last week. He continues to have hoarseness of his voice. He completed 3 cycles of treatment with single agent Abraxane and CT angiogram of the chest during his hospitalization showed evidence for disease progression of the right lower lobe lung mass. He also has some itching and mild skin rash. He denied having any significant chest pain or hemoptysis. He denied having any nausea or vomiting, no fever or chills. He is here today for evaluation and discussion of his treatment options.  MEDICAL HISTORY: Past Medical History  Diagnosis Date  . CAD (coronary artery disease)     Left Main 30% stenosis, LAD 20 - 30 % stenosis, first and second diagonal branchesat 40 - 50%  stenosis with small arteries, circumflex had 30% stenosis in the large obtuse marginal, RCA at 70 - 80%  stenosis [not felt to be occlusive after evaluation with flow wire], distal 50 - 60% stenosis - [name][  . COPD (chronic obstructive pulmonary disease) (HCC)     Dr. Baird Lyons  . Depression   . Anxiety   . Hyperlipidemia   . Chronic insomnia   . Gout   . GERD (gastroesophageal reflux disease)   . PVD (peripheral vascular disease) (HCC)     PTA/Stent right common iliac  . DDD (degenerative disc disease)   . Hx of colonoscopy   . COPD with asthma ([place]) 09/08/2007  . OSA (obstructive sleep apnea)     NPSG 09/10/10- AHI 11.3/hr  . Hypertension     dr Percival Spanish  . History of radiation therapy 11/10/13- 12/29/13    left lung 6600 cGy in 33 sessions  .  Diabetes mellitus without complication ([place]) 11/27/1592  . Cancer ([place])     lung  . Lung cancer ([place]) 10/04/13    LUL squamous cell lung cancer  . History of cardiovascular stress test     Myoview 7/16:  Diaphragmatic attenuation, no ischemia, EF 56%; Low Risk  . Itching due to drug 03/19/2016  . Nausea with vomiting 03/26/2016     ALLERGIES:  is allergic to carboplatin.  MEDICATIONS:  Current Outpatient Prescriptions  Medication Sig Dispense Refill  . ADVAIR DISKUS 500-50 MCG/DOSE AEPB 1 PUFF THEN RINSE MOUTH, TWICE DAILY MAINTENANCE 60 each 2  . albuterol (PROVENTIL) (2.5 MG/3ML) 0.083% nebulizer solution INHALE 1 VIAL IN NEBULIZER EVERY 4 HOURS AS NEEDED FOR WHEEZING OR SHORTNESS OF BREATH 300 mL 2  . albuterol (VENTOLIN HFA) 108 (90 Base) MCG/ACT inhaler INHALE 2 PUFFS INTO THE LUNGS 4 TIMES DAILY AS NEEDED FOR WHEEZING (Patient taking differently: Inhale 2 puffs into the lungs every 4 (four) hours as needed for wheezing. INHALE 2 PUFFS INTO THE LUNGS 4 TIMES DAILY AS NEEDED FOR WHEEZING) 18 Inhaler 11  . ARIPiprazole (ABILIFY) 5 MG tablet Take 1 tablet (5 mg total) by mouth at bedtime.    . Armodafinil 250 MG tablet Take 250 mg by mouth every morning.  5  . aspirin 81 MG EC tablet TAKE 1 TABLET BY MOUTH EVERY DAY 30 tablet 5  . atorvastatin (LIPITOR) 20 MG tablet TAKE 1 TABLET EVERY DAY (Patient taking differently: TAKE 1 TABLET EVERY DAY IN THE EVENING) 90 tablet 3  . BELSOMRA 20 MG TABS Take 1 tablet by mouth at bedtime as needed (for sleep). Reported on 03/29/2016  4  . clonazePAM (KLONOPIN) 1 MG tablet Take 2 mg by mouth at bedtime as needed for anxiety. Reported on 01/16/2016    . clopidogrel (PLAVIX) 75 MG tablet Take 1 tablet (75 mg total) by mouth daily. 90 tablet 3  . DALIRESP 500 MCG TABS tablet Take 500 mcg by mouth daily.     [name] dextromethorphan-guaiFENesin (MUCINEX DM) 30-600 MG 12hr tablet Take 1 tablet by mouth 2 (two) times daily as needed for cough (congestion).    [name] esomeprazole (NEXIUM) 40 MG capsule TAKE ONE CAPSULE BY MOUTH EVERY DAY 90 capsule 3  . FLORA-Q (FLORA-Q) CAPS capsule Take 1 capsule by mouth daily. 20 capsule 0  . FLUoxetine (PROZAC) 40 MG capsule Take 40 mg by mouth every morning.    . gabapentin (NEURONTIN) 100 MG capsule Take 100 mg by mouth daily.     [name] glipiZIDE (GLUCOTROL) 5 MG  tablet Take 0.5 tablets (2.5 mg total) by mouth daily before breakfast. 30 tablet 5  . isosorbide mononitrate (IMDUR) 120 MG 24 hr tablet Take 120 mg by mouth daily.  3  . isosorbide mononitrate (IMDUR) 60 MG 24 hr tablet Take 60 mg by mouth daily.    . metoprolol succinate (TOPROL-XL) 25 MG 24 hr tablet TAKE 1/2 TABLET BY MOUTH DAILY 30 tablet 3  . mirtazapine (REMERON) 45 MG tablet Take 1 tablet (45 mg total) by mouth at bedtime.    [name] morphine (MS CONTIN) 30 MG 12 hr tablet Take 1 tablet (30 mg total) by mouth every 12 (twelve) hours. 60 tablet 0  . morphine (MSIR) 15 MG tablet Take 1 tablet (15 mg total) by mouth every 4 (four) hours as needed for severe pain. 30 tablet 0  . nitroGLYCERIN (NITROSTAT) 0.4 MG SL tablet Place 1 tablet (0.4 mg total) under the tongue every 5 (  five) minutes as needed for chest pain. 25 tablet 3  . ondansetron (ZOFRAN) 8 MG tablet TAKE 1 TABLET (8 MG TOTAL) BY MOUTH EVERY 8 (EIGHT) HOURS AS NEEDED FOR NAUSEA OR VOMITING. 20 tablet 0  . OXYGEN Inhale 2 L into the lungs at bedtime as needed.    . potassium chloride SA (K-DUR,KLOR-CON) 20 MEQ tablet Take 1 tablet (20 mEq total) by mouth every morning. Reported on 12/07/2015 90 tablet 1  . predniSONE (DELTASONE) 5 MG tablet Take 1 tablet (5 mg total) by mouth daily. Please resume after she steroid taper is finished over after 9 days 30 tablet 1  . prochlorperazine (COMPAZINE) 10 MG tablet TAKE 1 TABLET EVERY 6 HOURS AS NEEDED FOR NAUSEA AND VOMITING 30 tablet 0  . tamsulosin (FLOMAX) 0.4 MG CAPS capsule Take 0.4 mg by mouth daily.  11  . theophylline (UNIPHYL) 400 MG 24 hr tablet TAKE 1 TABLET EVERY DAY 30 tablet 5  . Tiotropium Bromide Monohydrate (SPIRIVA RESPIMAT) 1.25 MCG/ACT AERS Inhale 2 puffs into the lungs daily. 4 g 11   No current facility-administered medications for this visit.    SURGICAL HISTORY:  Past Surgical History  Procedure Laterality Date  . Spinal fusion  03/05/2007    L4-L5  . Hip surgery       left 'bone graft taken"  . Arm surgery      left elbow  . Shoulder surgery      right  . C-spine surgery    . Angioplasty    . Video bronchoscopy with endobronchial navigation N/A 10/04/2013    Procedure: VIDEO BRONCHOSCOPY WITH ENDOBRONCHIAL NAVIGATION;  Surgeon: Collene Gobble, MD;  Location: [facility];  Service: Thoracic;  Laterality: N/A;  . Back surgery      lower  . Anterior fusion cervical spine      cervical fusion  7 yrs ago ([facility])  . Colon surgery      '11 "Diverticulitis"  . Colonoscopy with propofol N/A 05/22/2015    Procedure: COLONOSCOPY WITH PROPOFOL;  Surgeon: Inda Castle, MD;  Location: [facility];  Service: Endoscopy;  Laterality: N/A;  . Peripheral vascular catheterization N/A 08/10/2015    Procedure: Lower Extremity Angiography;  Surgeon: Lorretta Harp, MD; Distal Ao OK, L-EIA stent OK, R-CIA 100% s/p overlapping 7 mm x 38 mm ICast stents, 50-60% R-CFA        REVIEW OF SYSTEMS:  Constitutional: positive for fatigue Eyes: negative Ears, nose, mouth, throat, and face: negative Respiratory: positive for cough, dyspnea on exertion, sputum and wheezing Cardiovascular: negative Gastrointestinal: negative Genitourinary:negative Integument/breast: negative Hematologic/lymphatic: negative Musculoskeletal:negative Neurological: negative Behavioral/Psych: negative Endocrine: negative Allergic/Immunologic: negative   PHYSICAL EXAMINATION: General appearance: alert, cooperative and no distress Head: Normocephalic, without obvious abnormality, atraumatic Neck: no adenopathy, no JVD, supple, symmetrical, trachea midline and thyroid not enlarged, symmetric, no tenderness/mass/nodules Lymph nodes: Cervical, supraclavicular, and axillary nodes normal. Resp: wheezes bilaterally Back: symmetric, no curvature. ROM normal. No CVA tenderness. Cardio: regular rate and rhythm, S1, S2 normal, no murmur, click, rub or gallop GI: soft, non-tender; bowel sounds normal; no masses,   no organomegaly Extremities: extremities normal, atraumatic, no cyanosis or edema Neurologic: Alert and oriented X 3, normal strength and tone. Normal symmetric reflexes. Normal coordination and gait  ECOG PERFORMANCE STATUS: 1 - Symptomatic but completely ambulatory  Blood pressure 133/68, pulse 80, temperature 97.8 F (36.6 C), temperature source Oral, resp. rate 17, height '5\' 7"'$  (1.702 m), weight 193 lb 9.6 oz (87.816 kg), SpO2  94 %.  LABORATORY DATA: Lab Results  Component Value Date   WBC 16.2* 05/07/2016   HGB 13.3 05/07/2016   HCT 41.7 05/07/2016   MCV 91.2 05/07/2016   PLT 113* 05/07/2016      Chemistry      Component Value Date/Time   NA 138 05/07/2016 1153   NA 133* 04/21/2016 1443   K 4.5 05/07/2016 1153   K 3.5 04/21/2016 1443   CL 98* 04/21/2016 1443   CO2 30* 05/07/2016 1153   CO2 26 04/21/2016 1443   BUN 13.0 05/07/2016 1153   BUN 11 04/21/2016 1443   CREATININE 0.8 05/07/2016 1153   CREATININE 0.67 04/23/2016 0439   CREATININE 0.80 08/07/2015 0959      Component Value Date/Time   CALCIUM 9.8 05/07/2016 1153   CALCIUM 8.8* 04/21/2016 1443   ALKPHOS 93 05/07/2016 1153   ALKPHOS 77 04/21/2016 1850   AST 15 05/07/2016 1153   AST 16 04/21/2016 1850   ALT 22 05/07/2016 1153   ALT 18 04/21/2016 1850   BILITOT 0.48 05/07/2016 1153   BILITOT 1.0 04/21/2016 1850       RADIOGRAPHIC STUDIES: Dg Chest 2 View  04/21/2016  CLINICAL DATA:  60 year old male with increasing shortness of breath and sore throat. History of lung cancer, undergoing chemotherapy. If EXAM: CHEST  2 VIEW COMPARISON:  Chest x-ray 04/16/2016. FINDINGS: Again noted is a pulmonary mass in the left lower lobe, estimated to measure approximately 3.0 x 3.3 x 3.0 cm. There is some chronic scarring and volume loss right middle lobe, as evidenced by downward displacement of the minor fissure. Some ill-defined opacity in the right middle lobe is also noted, which may suggest some developing airspace  consolidation. Small right and trace left pleural effusions. No evidence of pulmonary edema. Chronic pleuroparenchymal thickening in the apex of the left hemithorax, very similar to prior examination. Within this area there appears to be a 2.6 cm cavity, also similar to priors. Heart size is normal. The patient is rotated to the left on today's exam, resulting in distortion of the mediastinal contours and reduced diagnostic sensitivity and specificity for mediastinal pathology. Orthopedic fixation hardware in the lower cervical spine incidentally noted. IMPRESSION: 1. Worsening atelectasis and airspace consolidation in the right middle lobe, concerning for developing right middle lobe pneumonia. 2. Right lower lobe pulmonary mass, similar to recent prior examinations, but more prominent than remote prior studies, suggesting progression of disease. 3. Small right and trace left pleural effusions. 4. Chronic scarring and cavitation in the left apex again noted. Electronically Signed   By: Vinnie Langton M.D.   On: 04/21/2016 13:19   Dg Chest 2 View  04/16/2016  CLINICAL DATA:  Followup pneumonia. Continued wheezing. COPD. History of lung cancer. EXAM: CHEST  2 VIEW COMPARISON:  03/30/2016 FINDINGS: Improving right lower lobe airspace opacity. Mild residual airspace opacity in the right lower lobe. Stable left apical density and posterior rounded density seen on the lateral view. Heart is normal size. No pleural effusions or acute bony abnormality. IMPRESSION: Improving right lower lobe pneumonia. Mild residual out pneumonia/ airspace opacity persists. This could be followed with repeat chest x-ray in 2-4 weeks to ensure continued clearance. Stable left apical pleural/ parenchymal density and rounded nodular density posteriorly on the lateral view. Electronically Signed   By: Rolm Baptise M.D.   On: 04/16/2016 13:28   Ct Chest W Contrast  04/22/2016  CLINICAL DATA:  Cough. Current history of non-small-cell lung  cancer. EXAM: CT  CHEST WITH CONTRAST TECHNIQUE: Multidetector CT imaging of the chest was performed during intravenous contrast administration. CONTRAST:  58m ISOVUE-300 IOPAMIDOL (ISOVUE-300) INJECTION 61% COMPARISON:  PET scan of January 23, 2016. FINDINGS: There is no evidence of pneumothorax. Right lower lobe mass measuring 33 x 26 mm is noted which is significantly increased in size compared to prior exam, consistent with metastatic lesion. Stable fibrosis and scarring is noted in left lung apex most consistent with radiation fibrosis. Coronary artery calcifications are noted. Stable 7 mm subpleural nodule is noted laterally in left lower lobe best seen on image number 90 of series 5. Given the lack of change this most likely is of benign etiology, but metastatic disease cannot be excluded. Mild right pleural effusion is noted with adjacent subsegmental atelectasis. New consolidation is noted in the right middle lobe consistent with pneumonia, atelectasis or possibly fibrosis. There appears to be narrowing of the bronchi in this area, concerning for neoplasm or malignancy. 23 x 8 mm right peritracheal lymph node is noted which is increased compared to prior exam and concerning for metastatic disease. 8 mm right peritracheal lymph node is noted more inferiorly which is not significantly changed compared to prior exam. 18 x 12 mm sub carinal lymph node is noted which is increased compared to prior exam. Multiple enlarged lymph nodes are noted adjacent to the descending thoracic aorta with the largest measuring 11 x 10 mm. This is increased compared to prior exam. Within the visualized portion of the upper abdomen, stable cystic lesion is noted in the spleen. No other abnormality is noted. No significant osseous abnormality is noted. IMPRESSION: 33 x 26 mm right lower lobe mass is noted which is significantly increased in size compared to prior exam, consistent with worsening metastatic lesion. Stable probable  post radiation fibrosis and scarring is noted in left lung apex. Coronary artery calcifications are noted suggesting coronary artery disease. Stable 7 mm subpleural nodule is noted laterally in left lower lobe. This most likely is benign in etiology given the lack of change, but metastatic disease cannot be excluded. Mediastinal adenopathy is noted as described above, most of which is increased in size and concerning for metastatic disease. Mild right pleural effusion is noted with adjacent subsegmental atelectasis. New opacity is noted in right middle lobe most consistent with postobstructive pneumonia or atelectasis given the narrowing of the central bronchi in this area. This is concerning for possible neoplasm or malignancy, and bronchoscopy is recommended for further evaluation. Electronically Signed   By: JMarijo Conception M.D.   On: 04/22/2016 14:48    ASSESSMENT AND PLAN: This is a very pleasant 60years old white male with stage IIIA non-small cell lung cancer, squamous cell carcinoma currently undergoing concurrent chemoradiation with weekly carboplatin and paclitaxel status post 7 weeks of treatment. He tolerated his treatment fairly well with no significant adverse effects. This was followed by consolidation chemotherapy with carboplatin and paclitaxel status post 3 cycles, .carboplatin was discontinued after cycle #2 secondary to hypersensitivity reaction. The recent CT scan of the chest progressive enlargement of the right lower lobe lung nodule concerning for pulmonary metastasis or new synchronous tumor. The recent PET scan showed hypermetabolic activity in the 1.9 cm right lower lobe pulmonary nodule suspicious for metastasis. There was also small mediastinal node metastasis. CT-guided core biopsy of the right lower lobe pulmonary nodule showed poorly differentiated carcinoma. PDL 1 expression was 0% The patient was started on treatment with single agent Abraxane status post 3 cycles but  unfortunately restaging CT scan of the chest showed evidence for disease progression. I discussed the scan results with the patient and his wife. I recommended for him to discontinue treatment with Abraxane at this point. I discussed with him other treatment options including treatment with single agent gemcitabine or immunotherapy with Nivolumab. The patient is interested in treatment with Nivolumab. He will be treated with Nivolumab 240 MG IV every 2 weeks starting next week. I discussed with him the adverse effect of this treatment including but not limited to immune mediated pneumonitis, skin rash, diarrhea, liver, renal, thyroid or other endocrine dysfunction. He would come back for follow-up visit in 3 weeks with the start of cycle #2. He was advised to call immediately if he has any concerning symptoms in the interval. The patient voices understanding of current disease status and treatment options and is in agreement with the current care plan.  All questions were answered. The patient knows to call the clinic with any problems, questions or concerns. We can certainly see the patient much sooner if necessary.  Disclaimer: This note was dictated with voice recognition software. Similar sounding words can inadvertently be transcribed and may not be corrected upon review.

## 2016-05-07 NOTE — Patient Instructions (Signed)

## 2016-05-08 ENCOUNTER — Encounter: Payer: Self-pay | Admitting: Internal Medicine

## 2016-05-08 DIAGNOSIS — E1151 Type 2 diabetes mellitus with diabetic peripheral angiopathy without gangrene: Secondary | ICD-10-CM | POA: Diagnosis not present

## 2016-05-08 DIAGNOSIS — J189 Pneumonia, unspecified organism: Secondary | ICD-10-CM | POA: Diagnosis not present

## 2016-05-08 DIAGNOSIS — J9611 Chronic respiratory failure with hypoxia: Secondary | ICD-10-CM | POA: Diagnosis not present

## 2016-05-08 DIAGNOSIS — C3412 Malignant neoplasm of upper lobe, left bronchus or lung: Secondary | ICD-10-CM | POA: Diagnosis not present

## 2016-05-08 DIAGNOSIS — I2510 Atherosclerotic heart disease of native coronary artery without angina pectoris: Secondary | ICD-10-CM | POA: Diagnosis not present

## 2016-05-08 DIAGNOSIS — J45909 Unspecified asthma, uncomplicated: Secondary | ICD-10-CM | POA: Diagnosis not present

## 2016-05-08 DIAGNOSIS — J440 Chronic obstructive pulmonary disease with acute lower respiratory infection: Secondary | ICD-10-CM | POA: Diagnosis not present

## 2016-05-08 DIAGNOSIS — D696 Thrombocytopenia, unspecified: Secondary | ICD-10-CM | POA: Diagnosis not present

## 2016-05-08 DIAGNOSIS — I10 Essential (primary) hypertension: Secondary | ICD-10-CM | POA: Diagnosis not present

## 2016-05-09 ENCOUNTER — Telehealth: Payer: Self-pay | Admitting: *Deleted

## 2016-05-09 NOTE — Telephone Encounter (Signed)
Per staff message and POF I have scheduled appts. Advised scheduler of appts and first available given on 7/3 JMW

## 2016-05-10 ENCOUNTER — Telehealth: Payer: Self-pay | Admitting: Internal Medicine

## 2016-05-10 MED ORDER — PREDNISONE 10 MG PO TABS: ORAL_TABLET | ORAL | Status: DC

## 2016-05-10 MED ORDER — AZITHROMYCIN 250 MG PO TABS: ORAL_TABLET | ORAL | Status: DC

## 2016-05-10 NOTE — Telephone Encounter (Signed)
Colds are viral infections and our options are limited. Offer prednisone 10  Mg, # 20, 4 X 2 DAYS, 3 X 2 DAYS, 2 X 2 DAYS, 1 X 2 DAYS            Zpak            Does he need cough syrup refilled ?

## 2016-05-10 NOTE — Telephone Encounter (Signed)
Spoke with pt, aware of recs.  rx sent to preferred pharmacy.  Nothing further needed.  

## 2016-05-10 NOTE — Telephone Encounter (Signed)
Spoke with pt. States that he has a cold. Reports cough, chest congestion, sinus congestion and SOB. Cough is non productive. Denies chest tightness, wheezing and fever. Symptoms started yesterday. Has tried OTC cough syrup and Mucinex with no relief. Would like something sent in. CY - please advise. Thanks.  Allergies  Allergen Reactions  . Carboplatin Shortness Of Breath, Swelling and Rash    Swelling of lips, rash on face,eyes and head   Current Outpatient Prescriptions on File Prior to Visit  Medication Sig Dispense Refill  . ADVAIR DISKUS 500-50 MCG/DOSE AEPB 1 PUFF THEN RINSE MOUTH, TWICE DAILY MAINTENANCE 60 each 2  . albuterol (PROVENTIL) (2.5 MG/3ML) 0.083% nebulizer solution INHALE 1 VIAL IN NEBULIZER EVERY 4 HOURS AS NEEDED FOR WHEEZING OR SHORTNESS OF BREATH 300 mL 2  . albuterol (VENTOLIN HFA) 108 (90 Base) MCG/ACT inhaler INHALE 2 PUFFS INTO THE LUNGS 4 TIMES DAILY AS NEEDED FOR WHEEZING (Patient taking differently: Inhale 2 puffs into the lungs every 4 (four) hours as needed for wheezing. INHALE 2 PUFFS INTO THE LUNGS 4 TIMES DAILY AS NEEDED FOR WHEEZING) 18 Inhaler 11  . ARIPiprazole (ABILIFY) 5 MG tablet Take 1 tablet (5 mg total) by mouth at bedtime.    . Armodafinil 250 MG tablet Take 250 mg by mouth every morning.  5  . aspirin 81 MG EC tablet TAKE 1 TABLET BY MOUTH EVERY DAY 30 tablet 5  . atorvastatin (LIPITOR) 20 MG tablet TAKE 1 TABLET EVERY DAY (Patient taking differently: TAKE 1 TABLET EVERY DAY IN THE EVENING) 90 tablet 3  . BELSOMRA 20 MG TABS Take 1 tablet by mouth at bedtime as needed (for sleep). Reported on 03/29/2016  4  . clonazePAM (KLONOPIN) 1 MG tablet Take 2 mg by mouth at bedtime as needed for anxiety. Reported on 01/16/2016    . clopidogrel (PLAVIX) 75 MG tablet Take 1 tablet (75 mg total) by mouth daily. 90 tablet 3  . DALIRESP 500 MCG TABS tablet Take 500 mcg by mouth daily.     [name] dextromethorphan-guaiFENesin (MUCINEX DM) 30-600 MG 12hr tablet Take 1  tablet by mouth 2 (two) times daily as needed for cough (congestion).    [name] esomeprazole (NEXIUM) 40 MG capsule TAKE ONE CAPSULE BY MOUTH EVERY DAY 90 capsule 3  . FLORA-Q (FLORA-Q) CAPS capsule Take 1 capsule by mouth daily. 20 capsule 0  . FLUoxetine (PROZAC) 40 MG capsule Take 40 mg by mouth every morning.    . gabapentin (NEURONTIN) 100 MG capsule Take 100 mg by mouth daily.     [name] glipiZIDE (GLUCOTROL) 5 MG tablet Take 0.5 tablets (2.5 mg total) by mouth daily before breakfast. 30 tablet 5  . isosorbide mononitrate (IMDUR) 120 MG 24 hr tablet Take 120 mg by mouth daily.  3  . isosorbide mononitrate (IMDUR) 60 MG 24 hr tablet Take 60 mg by mouth daily.    . metoprolol succinate (TOPROL-XL) 25 MG 24 hr tablet TAKE 1/2 TABLET BY MOUTH DAILY 30 tablet 3  . mirtazapine (REMERON) 45 MG tablet Take 1 tablet (45 mg total) by mouth at bedtime.    [name] morphine (MS CONTIN) 30 MG 12 hr tablet Take 1 tablet (30 mg total) by mouth every 12 (twelve) hours. 60 tablet 0  . morphine (MSIR) 15 MG tablet Take 1 tablet (15 mg total) by mouth every 4 (four) hours as needed for severe pain. 30 tablet 0  . nitroGLYCERIN (NITROSTAT) 0.4 MG SL tablet Place 1 tablet (0.4 mg total)  under the tongue every 5 (five) minutes as needed for chest pain. 25 tablet 3  . ondansetron (ZOFRAN) 8 MG tablet TAKE 1 TABLET (8 MG TOTAL) BY MOUTH EVERY 8 (EIGHT) HOURS AS NEEDED FOR NAUSEA OR VOMITING. 20 tablet 0  . OXYGEN Inhale 2 L into the lungs at bedtime as needed.    . potassium chloride SA (K-DUR,KLOR-CON) 20 MEQ tablet Take 1 tablet (20 mEq total) by mouth every morning. Reported on 12/07/2015 90 tablet 1  . predniSONE (DELTASONE) 5 MG tablet Take 1 tablet (5 mg total) by mouth daily. Please resume after she steroid taper is finished over after 9 days 30 tablet 1  . prochlorperazine (COMPAZINE) 10 MG tablet TAKE 1 TABLET EVERY 6 HOURS AS NEEDED FOR NAUSEA AND VOMITING 30 tablet 0  . tamsulosin (FLOMAX) 0.4 MG CAPS capsule Take 0.4 mg  by mouth daily.  11  . theophylline (UNIPHYL) 400 MG 24 hr tablet TAKE 1 TABLET EVERY DAY 30 tablet 5  . Tiotropium Bromide Monohydrate (SPIRIVA RESPIMAT) 1.25 MCG/ACT AERS Inhale 2 puffs into the lungs daily. 4 g 11   No current facility-administered medications on file prior to visit.

## 2016-05-13 ENCOUNTER — Other Ambulatory Visit: Payer: Self-pay | Admitting: *Deleted

## 2016-05-13 NOTE — Patient Outreach (Signed)
[facility]) Care Management  [place] Visit, Transition of Care day 18 05/13/2016  Ernest [name] 08-Feb-1956 [id number]  Ernest [name] is an 60 y.o. male followed by [name] for transition of care after recent inpatient hospital visit, as well as ongoing self management of chronic disease state of COPD. Ernest [name] has lung cancer and recently started a new round of chemotherapy. Ernest [name] was recently admitted May 28-April 25, 2016 for increased shortness of breath/ dyspnea; it was determined that he had healthcare associated pneumonia. Ernest [name] is now followed by [name] for transition of care after most recent hospital discharge on April 25, 2016.  Today, Ernest [name] reports that he is "feeling terrible." Ernest [name] states that he has had "more sinus problems," which cause him to "be miserable."  Ernest [name] reported that he was able to "get away for a few days at the beach," several weekends ago, "but it rained all weekend, and my wife and I both came back home sick."   Ernest [name] states that he thought he was going to have to go back to the hospital over the weekend, but was able to contact his pulmonologist who prescribed "more prednisone and more antibiotics," which he reported has "helped him a little bit."  Ernest [name] has a scheduled office visit with his oncologist tomorrow, which he states he is looking forward to. Ernest [name] states that the plan for his new round of chemotherapy will be discussed during tomorrow's visit.  Ernest [name] continues to report that he has all of his medications, and is taking them as they are prescribed. Ernest [name] was recently discharged from hospital and all medications were reviewed today during [name] home visit.  Ernest [name] acknowledges that he is taking Glucotrol for "high sugars with all this prednisone."  Ernest [name] reports that he checks his blood sugars daily, reporting a blood glucose this morning of 124 before eating.  Ernest [name] also states that he  is taking his Z-pack, which "is almost gone."  Ernest [name] denies questions about his medication regimen today.  Ernest [name] stated that home health had contacted him "early when I was released from the hospital," but states that he "hasn't heard from them since."  I confirmed that he has the number to the home [name] agreed to call them this afternoon to inquire about their plan for scheduling home visits, which he would like to start as soon as possible.   Ernest [name] verbalized an accurate report of his provider follow up appointments today and confirmed that he plans to attend each scheduled appointment.  Ernest [name] reports that he has continued using his home oxygen 100% of the time, "between 3 and 4 liters/ minute." Although Ernest [name] appears to look as if he feels badly, he is in no acute distress today.  Subjective: "I'm just so tired of feeling so bad every single day.... I am ready to start feeling better.  The first chemo I had only made me feel bad one day a week..... I've felt bad every day since I was put on the last round."  Objective:    BP 138/68 mmHg  Pulse 88  Resp 16  Wt 190 lb (86.183 kg)  SpO2 93%   Review of Systems  Constitutional: Positive for weight loss and malaise/fatigue. Negative for fever and chills.       Patient states that he has experienced these symptoms "since I started that chemo they just took me off of."  HENT: Positive for congestion.  Patient reports ongoing "sinus problems."  Pt. Reports "head congestion and pain," as well as "watery eyes," and "nasal drainage."   Eyes: Positive for pain.       Patient reports bilateral peri-orbital eye pain "because of my sinuses."  Respiratory: Positive for shortness of breath. Negative for cough, hemoptysis, sputum production and wheezing.   Cardiovascular: Negative.  Negative for chest pain and leg swelling.  Gastrointestinal: Negative for nausea and abdominal pain.  Genitourinary: Negative.   Musculoskeletal:  Negative for back pain and falls.  Neurological: Positive for weakness and headaches.  Psychiatric/Behavioral: Negative for depression. The patient is not nervous/anxious.     Physical Exam  Constitutional: He is oriented to person, place, and time. He appears well-developed and well-nourished.  Eyes: Right eye exhibits discharge. Left eye exhibits discharge.  Eyes are watering bilaterally  Cardiovascular: Normal rate, regular rhythm, normal heart sounds and intact distal pulses.   Respiratory: Effort normal and breath sounds normal. No respiratory distress. He has no wheezes. He has no rales.  No respiratory distress today; patient is on 3 L O2 via [name]; bilateral breath sounds are coarse in upper A/L/P lung fields and diminished in posterior bases, but are essentially clear to auscultation.  GI: Soft. Bowel sounds are normal.  Musculoskeletal: He exhibits no edema.  Neurological: He is alert and oriented to person, place, and time.  Skin: Skin is warm and dry.  Psychiatric: He has a normal mood and affect. His behavior is normal. Judgment and thought content normal.    Encounter Medications:   Outpatient Encounter Prescriptions as of 05/13/2016  Medication Sig Note  . ADVAIR DISKUS 500-50 MCG/DOSE AEPB 1 PUFF THEN RINSE MOUTH, TWICE DAILY MAINTENANCE   . albuterol (PROVENTIL) (2.5 MG/3ML) 0.083% nebulizer solution INHALE 1 VIAL IN NEBULIZER EVERY 4 HOURS AS NEEDED FOR WHEEZING OR SHORTNESS OF BREATH   . albuterol (VENTOLIN HFA) 108 (90 Base) MCG/ACT inhaler INHALE 2 PUFFS INTO THE LUNGS 4 TIMES DAILY AS NEEDED FOR WHEEZING (Patient taking differently: Inhale 2 puffs into the lungs every 4 (four) hours as needed for wheezing. INHALE 2 PUFFS INTO THE LUNGS 4 TIMES DAILY AS NEEDED FOR WHEEZING)   . ARIPiprazole (ABILIFY) 5 MG tablet Take 1 tablet (5 mg total) by mouth at bedtime.   . Armodafinil 250 MG tablet Take 250 mg by mouth every morning.   [name] aspirin 81 MG EC tablet TAKE 1 TABLET BY  MOUTH EVERY DAY   . atorvastatin (LIPITOR) 20 MG tablet TAKE 1 TABLET EVERY DAY (Patient taking differently: TAKE 1 TABLET EVERY DAY IN THE EVENING)   . azithromycin (ZITHROMAX Z-PAK) 250 MG tablet Take 2 tabs today, then 1 daily until gone.   . BELSOMRA 20 MG TABS Take 1 tablet by mouth at bedtime as needed (for sleep). Reported on 03/29/2016   . clonazePAM (KLONOPIN) 1 MG tablet Take 2 mg by mouth at bedtime as needed for anxiety. Reported on 01/16/2016   . clopidogrel (PLAVIX) 75 MG tablet Take 1 tablet (75 mg total) by mouth daily.   [name] dextromethorphan-guaiFENesin (MUCINEX DM) 30-600 MG 12hr tablet Take 1 tablet by mouth 2 (two) times daily as needed for cough (congestion).   [name] esomeprazole (NEXIUM) 40 MG capsule TAKE ONE CAPSULE BY MOUTH EVERY DAY   . FLORA-Q (FLORA-Q) CAPS capsule Take 1 capsule by mouth daily.   [name] FLUoxetine (PROZAC) 40 MG capsule Take 40 mg by mouth every morning.   . gabapentin (NEURONTIN) 100 MG capsule Take 100 mg  by mouth daily.    [name] glipiZIDE (GLUCOTROL) 5 MG tablet Take 0.5 tablets (2.5 mg total) by mouth daily before breakfast.   . isosorbide mononitrate (IMDUR) 120 MG 24 hr tablet Take 120 mg by mouth daily. 05/07/2016: Received from: External Pharmacy  . isosorbide mononitrate (IMDUR) 60 MG 24 hr tablet Take 60 mg by mouth daily.   . metoprolol succinate (TOPROL-XL) 25 MG 24 hr tablet TAKE 1/2 TABLET BY MOUTH DAILY   . mirtazapine (REMERON) 45 MG tablet Take 1 tablet (45 mg total) by mouth at bedtime.   [name] morphine (MS CONTIN) 30 MG 12 hr tablet Take 1 tablet (30 mg total) by mouth every 12 (twelve) hours.   [name] morphine (MSIR) 15 MG tablet Take 1 tablet (15 mg total) by mouth every 4 (four) hours as needed for severe pain.   . nitroGLYCERIN (NITROSTAT) 0.4 MG SL tablet Place 1 tablet (0.4 mg total) under the tongue every 5 (five) minutes as needed for chest pain.   [name] ondansetron (ZOFRAN) 8 MG tablet TAKE 1 TABLET (8 MG TOTAL) BY MOUTH EVERY 8 (EIGHT) HOURS AS NEEDED FOR  NAUSEA OR VOMITING.   . OXYGEN Inhale 2 L into the lungs at bedtime as needed. 01/16/2016: On 3L [place]   . potassium chloride SA (K-DUR,KLOR-CON) 20 MEQ tablet Take 1 tablet (20 mEq total) by mouth every morning. Reported on 12/07/2015   . predniSONE (DELTASONE) 10 MG tablet '40mg'$ X2 days, '30mg'$  X2 days, '20mg'$  X2 days, '10mg'$ X2 days, then stop.   . predniSONE (DELTASONE) 5 MG tablet Take 1 tablet (5 mg total) by mouth daily. Please resume after she steroid taper is finished over after 9 days   . prochlorperazine (COMPAZINE) 10 MG tablet TAKE 1 TABLET EVERY 6 HOURS AS NEEDED FOR NAUSEA AND VOMITING   . tamsulosin (FLOMAX) 0.4 MG CAPS capsule Take 0.4 mg by mouth daily. 05/07/2016: Received from: External Pharmacy Received Sig: TAKE 1 CAPSULE (0.4 MG TOTAL) BY MOUTH DAILY.  [name] theophylline (UNIPHYL) 400 MG 24 hr tablet TAKE 1 TABLET EVERY DAY   . Tiotropium Bromide Monohydrate (SPIRIVA RESPIMAT) 1.25 MCG/ACT AERS Inhale 2 puffs into the lungs daily.   [name] DALIRESP 500 MCG TABS tablet Take 500 mcg by mouth daily. Reported on 05/13/2016 05/13/2016: Patient reports that he is not taking; states that he doesn't believes it doesn't help; states PCP/ MD's aware.     No facility-administered encounter medications on file as of 05/13/2016.    Functional Status:   In your present state of health, do you have any difficulty performing the following activities: 04/21/2016 02/06/2016  Hearing? N N  Vision? N N  Difficulty concentrating or making decisions? Y N  Walking or climbing stairs? Y N  Dressing or bathing? N N  Doing errands, shopping? N -    Fall/Depression Screening:    [facility] 2/9 Scores 02/02/2016 12/07/2015 10/23/2015 10/16/2015  PHQ - 2 Score '1 1 1 1    '$ Assessment:  Ernest [name] continues to feel bad after his recent hospitalization and contributes this to his ongoing sinus problems that he has experienced since his last round of chemotherapy which has now been stopped.  Ernest [name] is looking forward to discussing his plan of  care for the immediate future with his oncologist tomorrow.  Ernest [name] would like to have days where he feels better and does not have ongoing sinus issues.  Ernest [name] is compliant with his health care plan of care and is interested in hearing from home health RN/PT with  their schedule to come visit/ work with him.  Plan:  Ernest [name] will take his medications as prescribed and keep all scheduled provider appointments.  Ernest [name] will contact home health nursing and PT services today to facilitate post-discharge home visit schedule around his upcoming provider appointments.  Ernest [name] will call his providers for any new concerns, issues, questions or problems.  [place] telephone outreach for transition of care scheduled for next week.  [name], RN, BSN, [facility] Care Management  [phone]

## 2016-05-14 ENCOUNTER — Telehealth: Payer: Self-pay | Admitting: Medical Oncology

## 2016-05-14 ENCOUNTER — Ambulatory Visit: Payer: Commercial Managed Care - HMO

## 2016-05-14 ENCOUNTER — Other Ambulatory Visit: Payer: Commercial Managed Care - HMO

## 2016-05-14 DIAGNOSIS — C3412 Malignant neoplasm of upper lobe, left bronchus or lung: Secondary | ICD-10-CM | POA: Diagnosis not present

## 2016-05-14 DIAGNOSIS — E1151 Type 2 diabetes mellitus with diabetic peripheral angiopathy without gangrene: Secondary | ICD-10-CM | POA: Diagnosis not present

## 2016-05-14 DIAGNOSIS — J189 Pneumonia, unspecified organism: Secondary | ICD-10-CM | POA: Diagnosis not present

## 2016-05-14 DIAGNOSIS — J440 Chronic obstructive pulmonary disease with acute lower respiratory infection: Secondary | ICD-10-CM | POA: Diagnosis not present

## 2016-05-14 DIAGNOSIS — J45909 Unspecified asthma, uncomplicated: Secondary | ICD-10-CM | POA: Diagnosis not present

## 2016-05-14 DIAGNOSIS — D696 Thrombocytopenia, unspecified: Secondary | ICD-10-CM | POA: Diagnosis not present

## 2016-05-14 DIAGNOSIS — I10 Essential (primary) hypertension: Secondary | ICD-10-CM | POA: Diagnosis not present

## 2016-05-14 DIAGNOSIS — J9611 Chronic respiratory failure with hypoxia: Secondary | ICD-10-CM | POA: Diagnosis not present

## 2016-05-14 DIAGNOSIS — I2510 Atherosclerotic heart disease of native coronary artery without angina pectoris: Secondary | ICD-10-CM | POA: Diagnosis not present

## 2016-05-14 NOTE — Telephone Encounter (Signed)
Per Julien Nordmann I left a message for pt and wife that Dr Julien Nordmann said we can r/s Ernest [name] to next week and to call us back and let us know what he wants to do

## 2016-05-14 NOTE — Telephone Encounter (Signed)
Wife called , both she and pt have caught a "cold". On sat he had temp 100.8. Pt saw Dr Annamaria Boots and [facility] and started on z-pack. He does not feel well enough to come in today for first Nivolumab. Note to [place].

## 2016-05-16 ENCOUNTER — Other Ambulatory Visit: Payer: Self-pay

## 2016-05-16 NOTE — Patient Outreach (Signed)
May 06, 2016-Late Entry  Telephone call made to patient for week #2 of Transition of care. Patient identified himself by providing his date of birth, address.  Patient advised that this RNCM was calling in his assigned RNCM's absence. Patient stated he was doing fine, as best as he can. Patient denies need for any further case management intervention at this time. Patient advised that his assigned RNCM would be contacting him next week for community care coordination.  Plan: Update patient's assigned case manager when she returns next week

## 2016-05-16 NOTE — Addendum Note (Signed)
Addended by: Tobi Bastos on: 05/16/2016 08:19 PM   Modules accepted: Level of Service, SmartSet

## 2016-05-16 NOTE — Progress Notes (Signed)
This encounter was created in error - please disregard.

## 2016-05-18 ENCOUNTER — Emergency Department (HOSPITAL_COMMUNITY): Payer: Commercial Managed Care - HMO

## 2016-05-18 ENCOUNTER — Encounter (HOSPITAL_COMMUNITY): Payer: Self-pay | Admitting: Emergency Medicine

## 2016-05-18 ENCOUNTER — Inpatient Hospital Stay (HOSPITAL_COMMUNITY)
Admission: EM | Admit: 2016-05-18 | Discharge: 2016-05-23 | DRG: 190 | Disposition: A | Discharge status: Home Health | Payer: Commercial Managed Care - HMO | Attending: Internal Medicine | Admitting: Internal Medicine

## 2016-05-18 ENCOUNTER — Other Ambulatory Visit: Payer: Self-pay

## 2016-05-18 DIAGNOSIS — E119 Type 2 diabetes mellitus without complications: Secondary | ICD-10-CM | POA: Diagnosis not present

## 2016-05-18 DIAGNOSIS — J449 Chronic obstructive pulmonary disease, unspecified: Secondary | ICD-10-CM | POA: Diagnosis not present

## 2016-05-18 DIAGNOSIS — J9621 Acute and chronic respiratory failure with hypoxia: Secondary | ICD-10-CM | POA: Diagnosis present

## 2016-05-18 DIAGNOSIS — I2510 Atherosclerotic heart disease of native coronary artery without angina pectoris: Secondary | ICD-10-CM | POA: Diagnosis present

## 2016-05-18 DIAGNOSIS — K219 Gastro-esophageal reflux disease without esophagitis: Secondary | ICD-10-CM | POA: Diagnosis not present

## 2016-05-18 DIAGNOSIS — G894 Chronic pain syndrome: Secondary | ICD-10-CM | POA: Diagnosis not present

## 2016-05-18 DIAGNOSIS — Y95 Nosocomial condition: Secondary | ICD-10-CM | POA: Diagnosis present

## 2016-05-18 DIAGNOSIS — C3491 Malignant neoplasm of unspecified part of right bronchus or lung: Secondary | ICD-10-CM | POA: Diagnosis not present

## 2016-05-18 DIAGNOSIS — Z8249 Family history of ischemic heart disease and other diseases of the circulatory system: Secondary | ICD-10-CM

## 2016-05-18 DIAGNOSIS — R0602 Shortness of breath: Secondary | ICD-10-CM | POA: Diagnosis not present

## 2016-05-18 DIAGNOSIS — J189 Pneumonia, unspecified organism: Secondary | ICD-10-CM | POA: Diagnosis present

## 2016-05-18 DIAGNOSIS — J441 Chronic obstructive pulmonary disease with (acute) exacerbation: Secondary | ICD-10-CM | POA: Diagnosis present

## 2016-05-18 DIAGNOSIS — I10 Essential (primary) hypertension: Secondary | ICD-10-CM | POA: Diagnosis present

## 2016-05-18 DIAGNOSIS — Z9981 Dependence on supplemental oxygen: Secondary | ICD-10-CM

## 2016-05-18 DIAGNOSIS — F418 Other specified anxiety disorders: Secondary | ICD-10-CM | POA: Diagnosis present

## 2016-05-18 DIAGNOSIS — G4733 Obstructive sleep apnea (adult) (pediatric): Secondary | ICD-10-CM | POA: Diagnosis present

## 2016-05-18 DIAGNOSIS — J440 Chronic obstructive pulmonary disease with acute lower respiratory infection: Secondary | ICD-10-CM | POA: Diagnosis not present

## 2016-05-18 DIAGNOSIS — Z87891 Personal history of nicotine dependence: Secondary | ICD-10-CM

## 2016-05-18 DIAGNOSIS — C3492 Malignant neoplasm of unspecified part of left bronchus or lung: Secondary | ICD-10-CM

## 2016-05-18 DIAGNOSIS — J9622 Acute and chronic respiratory failure with hypercapnia: Secondary | ICD-10-CM | POA: Diagnosis not present

## 2016-05-18 DIAGNOSIS — G4700 Insomnia, unspecified: Secondary | ICD-10-CM | POA: Diagnosis present

## 2016-05-18 DIAGNOSIS — Z825 Family history of asthma and other chronic lower respiratory diseases: Secondary | ICD-10-CM

## 2016-05-18 DIAGNOSIS — Z981 Arthrodesis status: Secondary | ICD-10-CM | POA: Diagnosis not present

## 2016-05-18 DIAGNOSIS — Z7902 Long term (current) use of antithrombotics/antiplatelets: Secondary | ICD-10-CM | POA: Diagnosis not present

## 2016-05-18 DIAGNOSIS — Z7982 Long term (current) use of aspirin: Secondary | ICD-10-CM | POA: Diagnosis not present

## 2016-05-18 DIAGNOSIS — E876 Hypokalemia: Secondary | ICD-10-CM | POA: Diagnosis present

## 2016-05-18 DIAGNOSIS — E785 Hyperlipidemia, unspecified: Secondary | ICD-10-CM | POA: Diagnosis present

## 2016-05-18 DIAGNOSIS — Z923 Personal history of irradiation: Secondary | ICD-10-CM

## 2016-05-18 DIAGNOSIS — R069 Unspecified abnormalities of breathing: Secondary | ICD-10-CM | POA: Diagnosis not present

## 2016-05-18 DIAGNOSIS — R0902 Hypoxemia: Secondary | ICD-10-CM | POA: Diagnosis not present

## 2016-05-18 DIAGNOSIS — C3490 Malignant neoplasm of unspecified part of unspecified bronchus or lung: Secondary | ICD-10-CM

## 2016-05-18 DIAGNOSIS — Z794 Long term (current) use of insulin: Secondary | ICD-10-CM | POA: Diagnosis not present

## 2016-05-18 DIAGNOSIS — F17210 Nicotine dependence, cigarettes, uncomplicated: Secondary | ICD-10-CM | POA: Diagnosis not present

## 2016-05-18 LAB — BLOOD GAS, ARTERIAL
ACID-BASE EXCESS: 6.8 mmol/L — AB (ref 0.0–2.0)
Acid-Base Excess: 6.4 mmol/L — ABNORMAL HIGH (ref 0.0–2.0)
Bicarbonate: 32 mEq/L — ABNORMAL HIGH (ref 20.0–24.0)
Bicarbonate: 30.2 mEq/L — ABNORMAL HIGH (ref 20.0–24.0)
O2 Content: 2 L/min
O2 Content: 4 L/min
O2 SAT: 90.5 %
O2 Saturation: 97.5 %
PATIENT TEMPERATURE: 98.6
PH ART: 7.479 — AB (ref 7.350–7.450)
Patient temperature: 98.6
TCO2: 28.6 mmol/L (ref 0–100)
TCO2: 26.5 mmol/L (ref 0–100)
pCO2 arterial: 49.9 mmHg — ABNORMAL HIGH (ref 35.0–45.0)
pCO2 arterial: 41.2 mmHg (ref 35.0–45.0)
pH, Arterial: 7.422 (ref 7.350–7.450)
pO2, Arterial: 60.2 mmHg — ABNORMAL LOW (ref 80.0–100.0)
pO2, Arterial: 92.7 mmHg (ref 80.0–100.0)

## 2016-05-18 LAB — URINALYSIS, ROUTINE W REFLEX MICROSCOPIC
GLUCOSE, UA: NEGATIVE mg/dL
HGB URINE DIPSTICK: NEGATIVE
Ketones, ur: 15 mg/dL — AB
Leukocytes, UA: NEGATIVE
Nitrite: NEGATIVE
PH: 7 (ref 5.0–8.0)
Protein, ur: 30 mg/dL — AB
SPECIFIC GRAVITY, URINE: 1.019 (ref 1.005–1.030)

## 2016-05-18 LAB — URINE MICROSCOPIC-ADD ON
BACTERIA UA: NONE SEEN
RBC / HPF: NONE SEEN RBC/hpf (ref 0–5)

## 2016-05-18 LAB — COMPREHENSIVE METABOLIC PANEL
ALBUMIN: 3.7 g/dL (ref 3.5–5.0)
ALT: 16 U/L — AB (ref 17–63)
AST: 18 U/L (ref 15–41)
Alkaline Phosphatase: 72 U/L (ref 38–126)
Anion gap: 8 (ref 5–15)
BUN: 8 mg/dL (ref 6–20)
CHLORIDE: 99 mmol/L — AB (ref 101–111)
CO2: 31 mmol/L (ref 22–32)
CREATININE: 0.81 mg/dL (ref 0.61–1.24)
Calcium: 8.9 mg/dL (ref 8.9–10.3)
GFR calc Af Amer: >60 mL/min (ref >60)
GLUCOSE: 138 mg/dL — AB (ref 65–99)
POTASSIUM: 3.3 mmol/L — AB (ref 3.5–5.1)
Sodium: 138 mmol/L (ref 135–145)
Total Bilirubin: 0.7 mg/dL (ref 0.3–1.2)
Total Protein: 6.5 g/dL (ref 6.5–8.1)

## 2016-05-18 LAB — GLUCOSE, CAPILLARY
GLUCOSE-CAPILLARY: 222 mg/dL — AB (ref 65–99)
Glucose-Capillary: 157 mg/dL — ABNORMAL HIGH (ref 65–99)

## 2016-05-18 LAB — CBC WITH DIFFERENTIAL/PLATELET
Basophils Absolute: 0 10*3/uL (ref 0.0–0.1)
Basophils Relative: 0 %
EOS ABS: 0.2 10*3/uL (ref 0.0–0.7)
EOS PCT: 2 %
HCT: 37 % — ABNORMAL LOW (ref 39.0–52.0)
Hemoglobin: 11.9 g/dL — ABNORMAL LOW (ref 13.0–17.0)
LYMPHS ABS: 0.4 10*3/uL — AB (ref 0.7–4.0)
Lymphocytes Relative: 5 %
MCH: 29.5 pg (ref 26.0–34.0)
MCHC: 32.2 g/dL (ref 30.0–36.0)
MCV: 91.6 fL (ref 78.0–100.0)
MONO ABS: 0.3 10*3/uL (ref 0.1–1.0)
Monocytes Relative: 3 %
Neutro Abs: 7.1 10*3/uL (ref 1.7–7.7)
Neutrophils Relative %: 90 %
PLATELETS: 146 10*3/uL — AB (ref 150–400)
RBC: 4.04 MIL/uL — AB (ref 4.22–5.81)
RDW: 18.4 % — AB (ref 11.5–15.5)
WBC: 7.9 10*3/uL (ref 4.0–10.5)

## 2016-05-18 LAB — EXPECTORATED SPUTUM ASSESSMENT W REFEX TO RESP CULTURE

## 2016-05-18 LAB — STREP PNEUMONIAE URINARY ANTIGEN: STREP PNEUMO URINARY ANTIGEN: NEGATIVE

## 2016-05-18 LAB — TROPONIN I

## 2016-05-18 LAB — EXPECTORATED SPUTUM ASSESSMENT W GRAM STAIN, RFLX TO RESP C

## 2016-05-18 MED ORDER — DM-GUAIFENESIN ER 30-600 MG PO TB12
1.0000 | ORAL_TABLET | Freq: Two times a day (BID) | ORAL | Status: DC
  Administered 2016-05-18 – 2016-05-23 (×10): 1 via ORAL
  Filled 2016-05-18 (×15): qty 1

## 2016-05-18 MED ORDER — METHYLPREDNISOLONE SODIUM SUCC 125 MG IJ SOLR
125.0000 mg | Freq: Once | INTRAMUSCULAR | Status: AC
  Administered 2016-05-18: 125 mg via INTRAVENOUS
  Filled 2016-05-18: qty 2

## 2016-05-18 MED ORDER — ATORVASTATIN CALCIUM 10 MG PO TABS
20.0000 mg | ORAL_TABLET | Freq: Every day | ORAL | Status: DC
  Administered 2016-05-18 – 2016-05-23 (×6): 20 mg via ORAL
  Filled 2016-05-18 (×3): qty 2
  Filled 2016-05-18 (×2): qty 1
  Filled 2016-05-18: qty 2
  Filled 2016-05-18 (×2): qty 1

## 2016-05-18 MED ORDER — PANTOPRAZOLE SODIUM 40 MG PO TBEC
80.0000 mg | DELAYED_RELEASE_TABLET | Freq: Every day | ORAL | Status: DC
  Administered 2016-05-18 – 2016-05-23 (×6): 80 mg via ORAL
  Filled 2016-05-18 (×7): qty 2

## 2016-05-18 MED ORDER — MORPHINE SULFATE ER 30 MG PO TBCR
30.0000 mg | EXTENDED_RELEASE_TABLET | Freq: Two times a day (BID) | ORAL | Status: DC
  Administered 2016-05-18 – 2016-05-23 (×10): 30 mg via ORAL
  Filled 2016-05-18 (×11): qty 1

## 2016-05-18 MED ORDER — MIRTAZAPINE 45 MG PO TABS
45.0000 mg | ORAL_TABLET | Freq: Every day | ORAL | Status: DC
  Administered 2016-05-18 – 2016-05-22 (×5): 45 mg via ORAL
  Filled 2016-05-18: qty 3
  Filled 2016-05-18: qty 1
  Filled 2016-05-18: qty 3
  Filled 2016-05-18 (×3): qty 1
  Filled 2016-05-18: qty 3
  Filled 2016-05-18 (×2): qty 1

## 2016-05-18 MED ORDER — ONDANSETRON HCL 4 MG PO TABS
4.0000 mg | ORAL_TABLET | Freq: Four times a day (QID) | ORAL | Status: DC | PRN
Start: 2016-05-18 — End: 2016-05-23

## 2016-05-18 MED ORDER — CLONAZEPAM 1 MG PO TABS
2.0000 mg | ORAL_TABLET | Freq: Every evening | ORAL | Status: DC | PRN
  Administered 2016-05-18 – 2016-05-22 (×5): 2 mg via ORAL
  Filled 2016-05-18 (×6): qty 2

## 2016-05-18 MED ORDER — ONDANSETRON HCL 4 MG/2ML IJ SOLN: 4.0000 mg | Freq: Four times a day (QID) | INTRAMUSCULAR | Status: DC | PRN

## 2016-05-18 MED ORDER — CLOPIDOGREL BISULFATE 75 MG PO TABS
75.0000 mg | ORAL_TABLET | Freq: Every day | ORAL | Status: DC
  Administered 2016-05-18 – 2016-05-23 (×6): 75 mg via ORAL
  Filled 2016-05-18 (×7): qty 1

## 2016-05-18 MED ORDER — ALBUTEROL (5 MG/ML) CONTINUOUS INHALATION SOLN
10.0000 mg/h | INHALATION_SOLUTION | Freq: Once | RESPIRATORY_TRACT | Status: AC
  Administered 2016-05-18: 10 mg/h via RESPIRATORY_TRACT
  Filled 2016-05-18: qty 20

## 2016-05-18 MED ORDER — ENSURE ENLIVE PO LIQD
237.0000 mL | Freq: Two times a day (BID) | ORAL | Status: DC
  Administered 2016-05-18 – 2016-05-23 (×7): 237 mL via ORAL

## 2016-05-18 MED ORDER — ARMODAFINIL 250 MG PO TABS: 250.0000 mg | ORAL_TABLET | Freq: Every morning | ORAL | Status: DC

## 2016-05-18 MED ORDER — FLORA-Q PO CAPS
1.0000 | ORAL_CAPSULE | Freq: Every day | ORAL | Status: DC
  Administered 2016-05-18 – 2016-05-23 (×6): 1 via ORAL
  Filled 2016-05-18 (×7): qty 1

## 2016-05-18 MED ORDER — ENOXAPARIN SODIUM 40 MG/0.4ML ~~LOC~~ SOLN
40.0000 mg | SUBCUTANEOUS | Status: DC
  Administered 2016-05-18 – 2016-05-22 (×5): 40 mg via SUBCUTANEOUS
  Filled 2016-05-18 (×6): qty 0.4

## 2016-05-18 MED ORDER — IPRATROPIUM BROMIDE 0.02 % IN SOLN: 0.5000 mg | Freq: Once | RESPIRATORY_TRACT | Status: DC

## 2016-05-18 MED ORDER — PREDNISONE 50 MG PO TABS
60.0000 mg | ORAL_TABLET | Freq: Every day | ORAL | Status: DC
  Administered 2016-05-19: 60 mg via ORAL
  Filled 2016-05-18 (×2): qty 1

## 2016-05-18 MED ORDER — INSULIN ASPART 100 UNIT/ML ~~LOC~~ SOLN
0.0000 [IU] | Freq: Every day | SUBCUTANEOUS | Status: DC
  Administered 2016-05-19 – 2016-05-21 (×3): 2 [IU] via SUBCUTANEOUS
  Administered 2016-05-22: 4 [IU] via SUBCUTANEOUS

## 2016-05-18 MED ORDER — THEOPHYLLINE ER 400 MG PO TB24
400.0000 mg | ORAL_TABLET | Freq: Every day | ORAL | Status: DC
  Administered 2016-05-18 – 2016-05-23 (×6): 400 mg via ORAL
  Filled 2016-05-18 (×6): qty 1

## 2016-05-18 MED ORDER — GABAPENTIN 100 MG PO CAPS
100.0000 mg | ORAL_CAPSULE | Freq: Every day | ORAL | Status: DC
  Administered 2016-05-18 – 2016-05-23 (×6): 100 mg via ORAL
  Filled 2016-05-18 (×7): qty 1

## 2016-05-18 MED ORDER — TAMSULOSIN HCL 0.4 MG PO CAPS
0.4000 mg | ORAL_CAPSULE | Freq: Every day | ORAL | Status: DC
  Administered 2016-05-18 – 2016-05-23 (×6): 0.4 mg via ORAL
  Filled 2016-05-18 (×7): qty 1

## 2016-05-18 MED ORDER — CEFEPIME HCL 2 G IJ SOLR: 2.0000 g | INTRAMUSCULAR | Status: DC

## 2016-05-18 MED ORDER — ASPIRIN EC 81 MG PO TBEC
81.0000 mg | DELAYED_RELEASE_TABLET | Freq: Every day | ORAL | Status: DC
Start: 2016-05-18 — End: 2016-05-23
  Administered 2016-05-18 – 2016-05-23 (×6): 81 mg via ORAL
  Filled 2016-05-18 (×6): qty 1

## 2016-05-18 MED ORDER — IPRATROPIUM BROMIDE 0.02 % IN SOLN
0.5000 mg | Freq: Once | RESPIRATORY_TRACT | Status: AC
  Administered 2016-05-18: 0.5 mg via RESPIRATORY_TRACT
  Filled 2016-05-18: qty 2.5

## 2016-05-18 MED ORDER — POTASSIUM CHLORIDE CRYS ER 20 MEQ PO TBCR
20.0000 meq | EXTENDED_RELEASE_TABLET | Freq: Every morning | ORAL | Status: DC
  Administered 2016-05-19 – 2016-05-23 (×5): 20 meq via ORAL
  Filled 2016-05-18 (×6): qty 1

## 2016-05-18 MED ORDER — VANCOMYCIN HCL IN DEXTROSE 1-5 GM/200ML-% IV SOLN
1000.0000 mg | Freq: Once | INTRAVENOUS | Status: AC
  Administered 2016-05-18: 1000 mg via INTRAVENOUS
  Filled 2016-05-18: qty 200

## 2016-05-18 MED ORDER — ARIPIPRAZOLE 5 MG PO TABS
5.0000 mg | ORAL_TABLET | Freq: Every day | ORAL | Status: DC
  Administered 2016-05-18 – 2016-05-22 (×5): 5 mg via ORAL
  Filled 2016-05-18 (×5): qty 1

## 2016-05-18 MED ORDER — ISOSORBIDE MONONITRATE ER 60 MG PO TB24
120.0000 mg | ORAL_TABLET | Freq: Every day | ORAL | Status: DC
  Administered 2016-05-18 – 2016-05-23 (×6): 120 mg via ORAL
  Filled 2016-05-18 (×6): qty 2

## 2016-05-18 MED ORDER — PROCHLORPERAZINE MALEATE 10 MG PO TABS
5.0000 mg | ORAL_TABLET | Freq: Three times a day (TID) | ORAL | Status: DC | PRN
  Filled 2016-05-18: qty 1

## 2016-05-18 MED ORDER — INSULIN ASPART 100 UNIT/ML ~~LOC~~ SOLN
0.0000 [IU] | Freq: Three times a day (TID) | SUBCUTANEOUS | Status: DC
  Administered 2016-05-18: 3 [IU] via SUBCUTANEOUS
  Administered 2016-05-19 (×2): 1 [IU] via SUBCUTANEOUS
  Administered 2016-05-19: 2 [IU] via SUBCUTANEOUS
  Administered 2016-05-20: 1 [IU] via SUBCUTANEOUS
  Administered 2016-05-20: 2 [IU] via SUBCUTANEOUS
  Administered 2016-05-20: 3 [IU] via SUBCUTANEOUS
  Administered 2016-05-21 (×2): 2 [IU] via SUBCUTANEOUS
  Administered 2016-05-22: 3 [IU] via SUBCUTANEOUS
  Administered 2016-05-22: 1 [IU] via SUBCUTANEOUS
  Administered 2016-05-22 – 2016-05-23 (×3): 2 [IU] via SUBCUTANEOUS

## 2016-05-18 MED ORDER — METOPROLOL SUCCINATE ER 25 MG PO TB24
12.5000 mg | ORAL_TABLET | Freq: Every day | ORAL | Status: DC
  Administered 2016-05-18 – 2016-05-23 (×6): 12.5 mg via ORAL
  Filled 2016-05-18 (×6): qty 1

## 2016-05-18 MED ORDER — FLUOXETINE HCL 20 MG PO CAPS
40.0000 mg | ORAL_CAPSULE | Freq: Every morning | ORAL | Status: DC
  Administered 2016-05-18 – 2016-05-23 (×6): 40 mg via ORAL
  Filled 2016-05-18 (×6): qty 2

## 2016-05-18 MED ORDER — POTASSIUM CHLORIDE CRYS ER 20 MEQ PO TBCR
40.0000 meq | EXTENDED_RELEASE_TABLET | Freq: Once | ORAL | Status: AC
  Administered 2016-05-18: 40 meq via ORAL
  Filled 2016-05-18: qty 2

## 2016-05-18 MED ORDER — IPRATROPIUM-ALBUTEROL 0.5-2.5 (3) MG/3ML IN SOLN
3.0000 mL | RESPIRATORY_TRACT | Status: DC
  Administered 2016-05-18 – 2016-05-20 (×13): 3 mL via RESPIRATORY_TRACT
  Filled 2016-05-18 (×13): qty 3

## 2016-05-18 MED ORDER — VANCOMYCIN HCL IN DEXTROSE 1-5 GM/200ML-% IV SOLN
1000.0000 mg | Freq: Three times a day (TID) | INTRAVENOUS | Status: DC
Start: 2016-05-18 — End: 2016-05-22
  Administered 2016-05-18 – 2016-05-22 (×12): 1000 mg via INTRAVENOUS
  Filled 2016-05-18 (×13): qty 200

## 2016-05-18 MED ORDER — MODAFINIL 200 MG PO TABS
200.0000 mg | ORAL_TABLET | Freq: Every day | ORAL | Status: DC
  Administered 2016-05-18 – 2016-05-23 (×6): 200 mg via ORAL
  Filled 2016-05-18 (×6): qty 1

## 2016-05-18 MED ORDER — MORPHINE SULFATE 15 MG PO TABS: 15.0000 mg | ORAL_TABLET | ORAL | Status: DC | PRN

## 2016-05-18 MED ORDER — DEXTROSE 5 % IV SOLN
1.0000 g | Freq: Three times a day (TID) | INTRAVENOUS | Status: DC
  Administered 2016-05-19 – 2016-05-22 (×12): 1 g via INTRAVENOUS
  Filled 2016-05-18 (×14): qty 1

## 2016-05-18 MED ORDER — IPRATROPIUM-ALBUTEROL 0.5-2.5 (3) MG/3ML IN SOLN
3.0000 mL | RESPIRATORY_TRACT | Status: DC | PRN
  Administered 2016-05-18: 3 mL via RESPIRATORY_TRACT
  Filled 2016-05-18: qty 3

## 2016-05-18 MED ORDER — IPRATROPIUM-ALBUTEROL 0.5-2.5 (3) MG/3ML IN SOLN: 3.0000 mL | RESPIRATORY_TRACT | Status: DC | PRN

## 2016-05-18 MED ORDER — CLONAZEPAM 0.5 MG PO TABS: 2.0000 mg | ORAL_TABLET | Freq: Three times a day (TID) | ORAL | Status: DC | PRN

## 2016-05-18 MED ORDER — SODIUM CHLORIDE 0.45 % IV SOLN
INTRAVENOUS | Status: DC
  Administered 2016-05-18 – 2016-05-21 (×3): via INTRAVENOUS

## 2016-05-18 NOTE — Progress Notes (Signed)
Pharmacy Antibiotic Follow-up Note  Ernest [name] is a 60 y.o. year-old male admitted on 05/18/2016.  The patient is currently on day 1 of Vancomycin & Cefepime for PNA, treat as HCAP. Hx of NSCLC, planned Nivolumab 6/20 postponed for URI.  Assessment/Plan: Vancomycin 1gm IV every 8 hours.  Goal trough 15-20 mcg/mL.  Cefepime 2gm x1, then 1gm q8hr  Temp (24hrs), Avg:98 F (36.7 C), Min:98 F (36.7 C), Max:98 F (36.7 C)   Recent Labs Lab 05/18/16 1145  WBC 7.9    Recent Labs Lab 05/18/16 1145  CREATININE 0.81   Estimated Creatinine Clearance: 100.4 mL/min (by C-G formula based on Cr of 0.81).    Allergies  Allergen Reactions  . Carboplatin Shortness Of Breath, Swelling and Rash    Swelling of lips, rash on face,eyes and head   Antimicrobials this admission: 6/24 Cefepime >>  6/24 Vanc >>   Levels/dose changes this admission:  Microbiology results: Previous cx: 5/28 MSSA in sputum                         5/29 MRSA PCR neg 6/24 BCx: sent  Thank you for allowing pharmacy to be a part of this patient's care.  Minda Ditto PharmD Pager [phone] 05/18/2016, 2:55 PM

## 2016-05-18 NOTE — ED Notes (Signed)
Patient made aware of urine sample. Urinal placed at bedside.

## 2016-05-18 NOTE — ED Notes (Signed)
Hospitalist at bedside 

## 2016-05-18 NOTE — ED Notes (Signed)
Pt c/o SOB worsening this moring. EMS noted bilateral wheezing and received 2 nebs, pt states improvement on arrival to ED. Hx of lung cancer and CHF and COPD. Pt's last treatment was 2 weeks ago. No distress on arrival to ED.

## 2016-05-18 NOTE — ED Provider Notes (Signed)
CSN: [id number]     Arrival date & time 05/18/16  1053 History   First MD Initiated Contact with Patient 05/18/16 1055     Chief Complaint  Patient presents with  . Shortness of Breath  PT IS A 60 YO WM WHO PRESENTS TO THE ED TODAY VIA EMS WITH SOB.  PT TOOK AN ALBUTEROL NEB PTA.  EMS GAVE PT A DUO NEB AND 125 MG OF SOLU MEDROL.  PT IS STILL SOB.  PT WEARS 2L HOME O2 AT ALL TIMES.  THE PT HAD A RECENT ADMISSION FOR PNEUMONIA (5/28-6/1).  HE WAS TREATED WITH VANC AND CEFEMPIME, THEN CHANGED TO ZITHROMAX AND LEVAQUIN AS AN OUTPATIENT.  THE PT DEVELOPED A FEVER ON 6/20 AND HIS PCP TX'D HIM WITH ZITHROMAX.  PT ALSO HAS A HX OF METASTATIC NON SMALL CELL LUNG CANCER.  HE WAS LAST TREATED WITH ABRAXANE ON 5/23.  HIS ONCOLOGIST ([name]) STOPPED THAT AS HIS DISEASE WAS PROGRESSING.  HE WAS DUE TO START NIVOLUMAB ON 6/20, BUT DUE TO THE FEVER, HE HAS NOT YET STARTED THAT.   (Consider location/radiation/quality/duration/timing/severity/associated sxs/prior Treatment) The history is provided by the patient and the EMS personnel.    Past Medical History  Diagnosis Date  . CAD (coronary artery disease)     Left Main 30% stenosis, LAD 20 - 30 % stenosis, first and second diagonal branchesat 40 - 50%  stenosis with small arteries, circumflex had 30% stenosis in the large obtuse marginal, RCA at 70 - 80%  stenosis [not felt to be occlusive after evaluation with flow wire], distal 50 - 60% stenosis - [name][  . COPD (chronic obstructive pulmonary disease) (HCC)     Dr. Baird Lyons  . Depression   . Anxiety   . Hyperlipidemia   . Chronic insomnia   . Gout   . GERD (gastroesophageal reflux disease)   . PVD (peripheral vascular disease) (HCC)     PTA/Stent right common iliac  . DDD (degenerative disc disease)   . Hx of colonoscopy   . 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 OSA (obstructive sleep apnea)     NPSG 09/10/10- AHI 11.3/hr  . Hypertension     dr Percival Spanish  . History of radiation  therapy 11/10/13- 12/29/13    left lung 6600 cGy in 33 sessions  . Diabetes mellitus without complication ([place]) 5/62/5638  . Cancer ([place])     lung  . Lung cancer ([place]) 10/04/13    LUL squamous cell lung cancer  . History of cardiovascular stress test     Myoview 7/16:  Diaphragmatic attenuation, no ischemia, EF 56%; Low Risk  . Itching due to drug 03/19/2016  . Nausea with vomiting 03/26/2016   Past Surgical History  Procedure Laterality Date  . Spinal fusion  03/05/2007    L4-L5  . Hip surgery      left 'bone graft taken"  . Arm surgery      left elbow  . Shoulder surgery      right  . C-spine surgery    . Angioplasty    . Video bronchoscopy with endobronchial navigation N/A 10/04/2013    Procedure: VIDEO BRONCHOSCOPY WITH ENDOBRONCHIAL NAVIGATION;  Surgeon: Collene Gobble, MD;  Location: [facility];  Service: Thoracic;  Laterality: N/A;  . Back surgery      lower  . Anterior fusion cervical spine      cervical fusion  7 yrs ago ([facility])  . Colon surgery      '  11 "Diverticulitis"  . Colonoscopy with propofol N/A 05/22/2015    Procedure: COLONOSCOPY WITH PROPOFOL;  Surgeon: Inda Castle, MD;  Location: [facility];  Service: Endoscopy;  Laterality: N/A;  . Peripheral vascular catheterization N/A 08/10/2015    Procedure: Lower Extremity Angiography;  Surgeon: Lorretta Harp, MD; Distal Ao OK, L-EIA stent OK, R-CIA 100% s/p overlapping 7 mm x 38 mm ICast stents, 50-60% R-CFA       Family History  Problem Relation Age of Onset  . Heart attack Mother   . Heart attack Sister   . Lung cancer Sister   . Cancer Sister     small cell lung, mets to brain  . Emphysema Sister   . Hypertension Brother   . Stroke Neg Hx    Social History  Substance Use Topics  . Smoking status: Former Smoker -- 0.00 packs/day for 43 years    Types: Cigarettes    Quit date: 09/23/2013  . Smokeless tobacco: Never Used     Comment: history of 3 PPD, 11/02/13   . Alcohol Use: No    Review of Systems   Constitutional: Positive for fever.  Respiratory: Positive for cough, shortness of breath and wheezing.   All other systems reviewed and are negative.     Allergies  Carboplatin  Home Medications   Prior to Admission medications   Medication Sig Start Date End Date Taking? Authorizing Provider  ADVAIR DISKUS 500-50 MCG/DOSE AEPB 1 PUFF THEN RINSE MOUTH, TWICE DAILY MAINTENANCE 04/26/16  Yes Deneise Lever, MD  albuterol (PROVENTIL) (2.5 MG/3ML) 0.083% nebulizer solution INHALE 1 VIAL IN NEBULIZER EVERY 4 HOURS AS NEEDED FOR WHEEZING OR SHORTNESS OF BREATH 04/04/16  Yes Deneise Lever, MD  albuterol (VENTOLIN HFA) 108 (90 Base) MCG/ACT inhaler INHALE 2 PUFFS INTO THE LUNGS 4 TIMES DAILY AS NEEDED FOR WHEEZING Patient taking differently: Inhale 2 puffs into the lungs every 4 (four) hours as needed for wheezing. INHALE 2 PUFFS INTO THE LUNGS 4 TIMES DAILY AS NEEDED FOR WHEEZING 02/14/16  Yes Deneise Lever, MD  ARIPiprazole (ABILIFY) 5 MG tablet Take 1 tablet (5 mg total) by mouth at bedtime. 03/30/13  Yes Doe-Hyun R Shawna Orleans, DO  Armodafinil 250 MG tablet Take 250 mg by mouth every morning. 03/08/16  Yes Historical Provider, MD  aspirin 81 MG EC tablet TAKE 1 TABLET BY MOUTH EVERY DAY Patient taking differently: TAKE 81 MG BY MOUTH EVERY DAY 01/04/16  Yes Janith Lima, MD  atorvastatin (LIPITOR) 20 MG tablet TAKE 1 TABLET EVERY DAY Patient taking differently: TAKE 20 MG  EVERY DAY IN THE EVENING 08/01/15  Yes Dorena Cookey, MD  BELSOMRA 20 MG TABS Take 20 mg by mouth at bedtime as needed (for sleep). Reported on 03/29/2016 12/26/15  Yes Historical Provider, MD  clonazePAM (KLONOPIN) 1 MG tablet Take 2 mg by mouth at bedtime as needed for anxiety. Reported on 01/16/2016   Yes Historical Provider, MD  clopidogrel (PLAVIX) 75 MG tablet Take 1 tablet (75 mg total) by mouth daily. 08/11/15  Yes Almyra Deforest, PA  dextromethorphan-guaiFENesin (MUCINEX DM) 30-600 MG 12hr tablet Take 1 tablet by mouth 2 (two) times  daily as needed for cough (congestion).   Yes Historical Provider, MD  esomeprazole (NEXIUM) 40 MG capsule TAKE ONE CAPSULE BY MOUTH EVERY DAY Patient taking differently: TAKE 40 MG  BY MOUTH EVERY DAY 05/06/16  Yes Janith Lima, MD  FLORA-Q [facility]) CAPS capsule Take 1 capsule by mouth daily. 04/25/16  Yes Albertine Patricia, MD  FLUoxetine (PROZAC) 40 MG capsule Take 40 mg by mouth every morning.   Yes Historical Provider, MD  gabapentin (NEURONTIN) 100 MG capsule Take 100 mg by mouth daily.  07/15/15  Yes Historical Provider, MD  glipiZIDE (GLUCOTROL) 5 MG tablet Take 0.5 tablets (2.5 mg total) by mouth daily before breakfast. 05/07/16  Yes Janith Lima, MD  isosorbide mononitrate (IMDUR) 120 MG 24 hr tablet Take 120 mg by mouth daily. 04/26/16  Yes Historical Provider, MD  metoprolol succinate (TOPROL-XL) 25 MG 24 hr tablet TAKE 1/2 TABLET BY MOUTH DAILY Patient taking differently: TAKE 12.5 MG BY MOUTH DAILY 03/27/16  Yes Minus Breeding, MD  mirtazapine (REMERON) 45 MG tablet Take 1 tablet (45 mg total) by mouth at bedtime. 12/29/14  Yes Tammy S Parrett, NP  morphine (MS CONTIN) 30 MG 12 hr tablet Take 1 tablet (30 mg total) by mouth every 12 (twelve) hours. 04/16/16  Yes Janith Lima, MD  morphine (MSIR) 15 MG tablet Take 1 tablet (15 mg total) by mouth every 4 (four) hours as needed for severe pain. 04/16/16  Yes Janith Lima, MD  nitroGLYCERIN (NITROSTAT) 0.4 MG SL tablet Place 1 tablet (0.4 mg total) under the tongue every 5 (five) minutes as needed for chest pain. 08/09/14  Yes Minus Breeding, MD  ondansetron (ZOFRAN) 8 MG tablet TAKE 1 TABLET (8 MG TOTAL) BY MOUTH EVERY 8 (EIGHT) HOURS AS NEEDED FOR NAUSEA OR VOMITING. 04/11/16  Yes Curt Bears, MD  OXYGEN Inhale 2 L into the lungs at bedtime as needed.   Yes Historical Provider, MD  pantoprazole (PROTONIX) 40 MG tablet Take 40 mg by mouth daily as needed (reflux).  05/06/16  Yes Historical Provider, MD  potassium chloride SA  (K-DUR,KLOR-CON) 20 MEQ tablet Take 1 tablet (20 mEq total) by mouth every morning. Reported on 12/07/2015 12/12/15  Yes Janith Lima, MD  prochlorperazine (COMPAZINE) 10 MG tablet TAKE 1 TABLET EVERY 6 HOURS AS NEEDED FOR NAUSEA AND VOMITING Patient taking differently: TAKE 10 MG EVERY 6 HOURS AS NEEDED FOR NAUSEA AND VOMITING 04/11/16  Yes Curt Bears, MD  tamsulosin (FLOMAX) 0.4 MG CAPS capsule Take 0.4 mg by mouth daily. 04/26/16  Yes Historical Provider, MD  theophylline (UNIPHYL) 400 MG 24 hr tablet TAKE 1 TABLET EVERY DAY Patient taking differently: TAKE 400 MG BY MOUTH DAILY 04/01/16  Yes Deneise Lever, MD  Tiotropium Bromide Monohydrate (SPIRIVA RESPIMAT) 1.25 MCG/ACT AERS Inhale 2 puffs into the lungs daily. 04/16/16  Yes Janith Lima, MD  azithromycin (ZITHROMAX Z-PAK) 250 MG tablet Take 2 tabs today, then 1 daily until gone. Patient not taking: Reported on 05/18/2016 05/10/16   Deneise Lever, MD  predniSONE (DELTASONE) 10 MG tablet '40mg'$ X2 days, '30mg'$  X2 days, '20mg'$  X2 days, '10mg'$ X2 days, then stop. Patient not taking: Reported on 05/18/2016 05/10/16   Deneise Lever, MD  predniSONE (DELTASONE) 5 MG tablet Take 1 tablet (5 mg total) by mouth daily. Please resume after she steroid taper is finished over after 9 days Patient not taking: Reported on 05/18/2016 05/07/16   Janith Lima, MD   BP 124/59 mmHg  Pulse 71  Temp(Src) 98 F (36.7 C) (Oral)  Resp 24  SpO2 97% Physical Exam  Constitutional: He is oriented to person, place, and time. He appears well-developed and well-nourished. He appears distressed.  HENT:  Head: Normocephalic and atraumatic.  Right Ear: External ear normal.  Left Ear: External ear normal.  Nose: Nose normal.  Mouth/Throat: Mucous membranes are dry.  Eyes: Conjunctivae and EOM are normal. Pupils are equal, round, and reactive to light.  Neck: Normal range of motion. Neck supple.  Cardiovascular: Normal rate, regular rhythm, normal heart sounds and intact  distal pulses.   Pulmonary/Chest: Tachypnea noted. He is in respiratory distress. He has decreased breath sounds. He has wheezes. He has rhonchi.  Abdominal: Soft. Bowel sounds are normal.  Musculoskeletal: Normal range of motion.  Neurological: He is alert and oriented to person, place, and time.  Skin: Skin is warm and dry.  Psychiatric: He has a normal mood and affect. His behavior is normal. Judgment and thought content normal.  Nursing note and vitals reviewed.   ED Course  Procedures (including critical care time) Labs Review Labs Reviewed  CBC WITH DIFFERENTIAL/PLATELET - Abnormal; Notable for the following:    RBC 4.04 (*)    Hemoglobin 11.9 (*)    HCT 37.0 (*)    RDW 18.4 (*)    Platelets 146 (*)    Lymphs Abs 0.4 (*)    All other components within normal limits  COMPREHENSIVE METABOLIC PANEL - Abnormal; Notable for the following:    Potassium 3.3 (*)    Chloride 99 (*)    Glucose, Bld 138 (*)    ALT 16 (*)    All other components within normal limits  BLOOD GAS, ARTERIAL - Abnormal; Notable for the following:    pCO2 arterial 49.9 (*)    pO2, Arterial 60.2 (*)    Bicarbonate 32.0 (*)    Acid-Base Excess 6.8 (*)    All other components within normal limits  TROPONIN I  URINALYSIS, ROUTINE W REFLEX MICROSCOPIC (NOT AT [facility])    Imaging Review Dg Chest 2 View  05/18/2016  CLINICAL DATA:  Shortness of breath.  Lung cancer. EXAM: CHEST  2 VIEW COMPARISON:  04/21/2016 chest radiograph.  04/22/2016 chest CT . FINDINGS: Surgical hardware from ACD[facility] overlies the lower cervical spine. Stable cardiomediastinal silhouette with normal heart size. No pneumothorax. No pleural effusion. Significant growth of 6.3 cm right lower lobe lung mass. Stable left apical pleural capping and distortion. No pulmonary edema. No acute consolidative airspace disease. IMPRESSION: Significant growth of 6.3 cm right lower lobe lung mass, suggesting either a lung metastasis or metachronous primary lung  cancer. Stable post treatment changes in the left upper lung. Electronically Signed   By: Ilona Sorrel M.D.   On: 05/18/2016 11:54   I have personally reviewed and evaluated these images and lab results as part of my medical decision-making.   EKG Interpretation None     EKG:  NSR.  HR 75.  NO ST OR T WAVE CHANGES MDM  PT HAS IMPROVED ON A CONTINUOUS ALBUTEROL NEB, BUT HE IS STILL VERY TACHYPNEIC WITH DIFFUSE WHEEZES.  PT'S SX ARE NOT IMPROVING AS AN OUTPATIENT, SO HE WILL BE ADMITTED.  PT D/W DR. Marily Memos ([facility]) WHO WILL ADMIT PT. Final diagnoses:  COPD with acute exacerbation ([place])  Non-small cell lung cancer, unspecified laterality ([place])  Hypoxia        Isla Pence, MD 05/18/16 1258

## 2016-05-18 NOTE — ED Notes (Signed)
Respiratory called for continuous neb. Pt to XR.

## 2016-05-18 NOTE — ED Notes (Signed)
Bed: WA11 Expected date:  Expected time:  Means of arrival:  Comments: EMS-SOB 

## 2016-05-18 NOTE — H&P (Addendum)
History and Physical    Ernest [name] TXH:[id number] DOB: [birth year] DOA: 05/18/2016  PCP: [name], MD Patient coming from: Home  Chief Complaint: SOB  HPI: Ernest [name] is a 60 y.o. 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 Patient was seen by his PCP that day and prescribed azithromycin. Patient said very little improvement in overall symptoms since that time. Patient's symptoms initially began he did have fevers though these resolved after treatment with azithromycin. She had completed his azithromycin course on 05/16/2016. The patient was admitted from 04/21/2016 to 04/25/2016 for treatment of heat. Patient finished antibiotics on 04/27/2016. At that point time patient states that he felt at baseline. Patient endorses compliance with his home O2 2 L. Patient is used home breathing treatments with short-lived improvement in symptoms. Last chemotherapy, Abraxane, on 5/23. Patient also stopped his home daily prednisone dose of 5 mg. He stopped this one week ago. Patient did take 1 dose yesterday to CT would help the symptoms. Little to no benefit.   ED Course: Patient placed on CAT, with some improvement in symptoms. Objective findings below.  Review of Systems: As per HPI otherwise 10 point review of systems negative.   Ambulatory Status:no restrictions  Other than continuous O2  Past Medical History  Diagnosis Date  . CAD (coronary artery disease)     Left Main 30% stenosis, LAD 20 - 30 % stenosis, first and second diagonal branchesat 40 - 50%  stenosis with small arteries,  circumflex had 30% stenosis in the large obtuse marginal, RCA at 70 - 80%  stenosis [not felt to be occlusive after evaluation with flow wire], distal 50 - 60% stenosis - [name][  . COPD (chronic obstructive pulmonary disease) (HCC)     Dr. Baird Lyons  . Depression   . Anxiety   . Hyperlipidemia   . Chronic insomnia   . Gout   . GERD (gastroesophageal reflux disease)   . PVD (peripheral vascular disease) (HCC)     PTA/Stent right common iliac  . DDD (degenerative disc disease)   . Hx of colonoscopy   . COPD with asthma ([place]) 09/08/2007  . OSA (obstructive sleep apnea)     NPSG 09/10/10- AHI 11.3/hr  . Hypertension     dr Percival Spanish  . History of radiation therapy 11/10/13- 12/29/13    left lung 6600 cGy in 33 sessions  . Diabetes mellitus without complication ([place]) 12/27/3341  . Cancer ([place])     lung  . Lung cancer ([place]) 10/04/13    LUL squamous cell lung cancer  . History of cardiovascular stress test     Myoview 7/16:  Diaphragmatic attenuation, no ischemia, EF 56%; Low Risk  . Itching due to drug 03/19/2016  . Nausea with vomiting 03/26/2016    Past Surgical History  Procedure Laterality Date  . Spinal fusion  03/05/2007    L4-L5  . Hip surgery      left 'bone graft taken"  . Arm surgery      left elbow  . Shoulder surgery      right  . C-spine surgery    . Angioplasty    . Video bronchoscopy with endobronchial  navigation N/A 10/04/2013    Procedure: VIDEO BRONCHOSCOPY WITH ENDOBRONCHIAL NAVIGATION;  Surgeon: Collene Gobble, MD;  Location: [facility];  Service: Thoracic;  Laterality: N/A;  . Back surgery      lower  . Anterior fusion cervical spine      cervical fusion  7 yrs ago ([facility])  . Colon surgery      '11 "Diverticulitis"  . Colonoscopy with propofol N/A 05/22/2015    Procedure: COLONOSCOPY WITH PROPOFOL;  Surgeon: Inda Castle, MD;  Location: [facility];  Service: Endoscopy;  Laterality: N/A;  . Peripheral vascular catheterization N/A 08/10/2015     Procedure: Lower Extremity Angiography;  Surgeon: Lorretta Harp, MD; Distal Ao OK, L-EIA stent OK, R-CIA 100% s/p overlapping 7 mm x 38 mm ICast stents, 50-60% R-CFA        Social History   Social History  . Marital Status: Married    Spouse Name: N/A  . Number of Children: N/A  . Years of Education: N/A   Occupational History  . Disabled welder    Social History Main Topics  . Smoking status: Former Smoker -- 0.00 packs/day for 43 years    Types: Cigarettes    Quit date: 09/23/2013  . Smokeless tobacco: Never Used     Comment: history of 3 PPD, 11/02/13   . Alcohol Use: No  . Drug Use: No  . Sexual Activity: No   Other Topics Concern  . Not on file   Social History Narrative    Allergies  Allergen Reactions  . Carboplatin Shortness Of Breath, Swelling and Rash    Swelling of lips, rash on face,eyes and head    Family History  Problem Relation Age of Onset  . Heart attack Mother   . Heart attack Sister   . Lung cancer Sister   . Cancer Sister     small cell lung, mets to brain  . Emphysema Sister   . Hypertension Brother   . Stroke Neg Hx     Prior to Admission medications   Medication Sig Start Date End Date Taking? Authorizing Provider  ADVAIR DISKUS 500-50 MCG/DOSE AEPB 1 PUFF THEN RINSE MOUTH, TWICE DAILY MAINTENANCE 04/26/16  Yes Deneise Lever, MD  albuterol (PROVENTIL) (2.5 MG/3ML) 0.083% nebulizer solution INHALE 1 VIAL IN NEBULIZER EVERY 4 HOURS AS NEEDED FOR WHEEZING OR SHORTNESS OF BREATH 04/04/16  Yes Deneise Lever, MD  albuterol (VENTOLIN HFA) 108 (90 Base) MCG/ACT inhaler INHALE 2 PUFFS INTO THE LUNGS 4 TIMES DAILY AS NEEDED FOR WHEEZING Patient taking differently: Inhale 2 puffs into the lungs every 4 (four) hours as needed for wheezing. INHALE 2 PUFFS INTO THE LUNGS 4 TIMES DAILY AS NEEDED FOR WHEEZING 02/14/16  Yes Deneise Lever, MD  ARIPiprazole (ABILIFY) 5 MG tablet Take 1 tablet (5 mg total) by mouth at bedtime. 03/30/13  Yes Doe-Hyun R Shawna Orleans,  DO  Armodafinil 250 MG tablet Take 250 mg by mouth every morning. 03/08/16  Yes Historical Provider, MD  aspirin 81 MG EC tablet TAKE 1 TABLET BY MOUTH EVERY DAY Patient taking differently: TAKE 81 MG BY MOUTH EVERY DAY 01/04/16  Yes Janith Lima, MD  atorvastatin (LIPITOR) 20 MG tablet TAKE 1 TABLET EVERY DAY Patient taking differently: TAKE 20 MG  EVERY DAY IN THE EVENING 08/01/15  Yes Dorena Cookey, MD  BELSOMRA 20 MG TABS Take 20 mg by mouth at bedtime as needed (for sleep). Reported on 03/29/2016 12/26/15  Yes Historical Provider, MD  clonazePAM (KLONOPIN) 1 MG tablet Take 2 mg by mouth at bedtime as needed for anxiety. Reported on 01/16/2016   Yes Historical Provider, MD  clopidogrel (PLAVIX) 75 MG tablet Take 1 tablet (75 mg total) by mouth daily. 08/11/15  Yes Almyra Deforest, PA  dextromethorphan-guaiFENesin (MUCINEX DM) 30-600 MG 12hr tablet Take 1 tablet by mouth 2 (two) times daily as needed for cough (congestion).   Yes Historical Provider, MD  esomeprazole (NEXIUM) 40 MG capsule TAKE ONE CAPSULE BY MOUTH EVERY DAY Patient taking differently: TAKE 40 MG  BY MOUTH EVERY DAY 05/06/16  Yes Janith Lima, MD  FLORA-Q [facility]) CAPS capsule Take 1 capsule by mouth daily. 04/25/16  Yes Silver Huguenin Elgergawy, MD  FLUoxetine (PROZAC) 40 MG capsule Take 40 mg by mouth every morning.   Yes Historical Provider, MD  gabapentin (NEURONTIN) 100 MG capsule Take 100 mg by mouth daily.  07/15/15  Yes Historical Provider, MD  glipiZIDE (GLUCOTROL) 5 MG tablet Take 0.5 tablets (2.5 mg total) by mouth daily before breakfast. 05/07/16  Yes Janith Lima, MD  isosorbide mononitrate (IMDUR) 120 MG 24 hr tablet Take 120 mg by mouth daily. 04/26/16  Yes Historical Provider, MD  metoprolol succinate (TOPROL-XL) 25 MG 24 hr tablet TAKE 1/2 TABLET BY MOUTH DAILY Patient taking differently: TAKE 12.5 MG BY MOUTH DAILY 03/27/16  Yes Minus Breeding, MD  mirtazapine (REMERON) 45 MG tablet Take 1 tablet (45 mg total) by mouth at bedtime.  12/29/14  Yes Tammy S Parrett, NP  morphine (MS CONTIN) 30 MG 12 hr tablet Take 1 tablet (30 mg total) by mouth every 12 (twelve) hours. 04/16/16  Yes Janith Lima, MD  morphine (MSIR) 15 MG tablet Take 1 tablet (15 mg total) by mouth every 4 (four) hours as needed for severe pain. 04/16/16  Yes Janith Lima, MD  nitroGLYCERIN (NITROSTAT) 0.4 MG SL tablet Place 1 tablet (0.4 mg total) under the tongue every 5 (five) minutes as needed for chest pain. 08/09/14  Yes Minus Breeding, MD  ondansetron (ZOFRAN) 8 MG tablet TAKE 1 TABLET (8 MG TOTAL) BY MOUTH EVERY 8 (EIGHT) HOURS AS NEEDED FOR NAUSEA OR VOMITING. 04/11/16  Yes Curt Bears, MD  OXYGEN Inhale 2 L into the lungs at bedtime as needed.   Yes Historical Provider, MD  pantoprazole (PROTONIX) 40 MG tablet Take 40 mg by mouth daily as needed (reflux).  05/06/16  Yes Historical Provider, MD  potassium chloride SA (K-DUR,KLOR-CON) 20 MEQ tablet Take 1 tablet (20 mEq total) by mouth every morning. Reported on 12/07/2015 12/12/15  Yes Janith Lima, MD  prochlorperazine (COMPAZINE) 10 MG tablet TAKE 1 TABLET EVERY 6 HOURS AS NEEDED FOR NAUSEA AND VOMITING Patient taking differently: TAKE 10 MG EVERY 6 HOURS AS NEEDED FOR NAUSEA AND VOMITING 04/11/16  Yes Curt Bears, MD  tamsulosin (FLOMAX) 0.4 MG CAPS capsule Take 0.4 mg by mouth daily. 04/26/16  Yes Historical Provider, MD  theophylline (UNIPHYL) 400 MG 24 hr tablet TAKE 1 TABLET EVERY DAY Patient taking differently: TAKE 400 MG BY MOUTH DAILY 04/01/16  Yes Deneise Lever, MD  Tiotropium Bromide Monohydrate (SPIRIVA RESPIMAT) 1.25 MCG/ACT AERS Inhale 2 puffs into the lungs daily. 04/16/16  Yes Janith Lima, MD  azithromycin (ZITHROMAX Z-PAK) 250 MG tablet Take 2 tabs today, then 1 daily until gone. Patient not taking: Reported on 05/18/2016 05/10/16   Deneise Lever, MD  predniSONE (DELTASONE) 10 MG tablet 110mX2 days, 338mX2 days, 2049m2 days,  53mX2 days, then stop. Patient not taking: Reported on  05/18/2016 05/10/16   CDeneise Lever MD  predniSONE (DELTASONE) 5 MG tablet Take 1 tablet (5 mg total) by mouth daily. Please resume after she steroid taper is finished over after 9 days Patient not taking: Reported on 05/18/2016 05/07/16   TJanith Lima MD    Physical Exam: Filed Vitals:   05/18/16 1230 05/18/16 1300 05/18/16 1308 05/18/16 1341  BP: 124/59 147/87  152/67  Pulse: 71 87 83 86  Temp:      TempSrc:      Resp: 24 31 15 22   SpO2: 97% 96% 95% 94%     General: Appears to be in mild distress, lying in bed. Eyes:  PERRL, EOMI, ENT:  grossly normal hearing, lips & tongue, mmm Neck:  no LAD, masses or thyromegaly Cardiovascular: Difficult to appreciate cardiac sounds due to patient's respiratory symptoms and O2 administration at time of exam RRR, no m/r/g. No LE edema.  Respiratory: Diffuse wheezing with diminished breath sounds in the bases with right middle lobe rhonchi and crackles, increased effort Abdomen:  soft, ntnd, NABS Skin:  no rash or induration seen on limited exam Musculoskeletal:  grossly normal tone BUE/BLE, good ROM, no bony abnormality Psychiatric:  grossly normal mood and affect, speech fluent and appropriate, AOx3 Neurologic:  CN 2-12 grossly intact, moves all extremities in coordinated fashion, sensation intact  Labs on Admission: I have personally reviewed following labs and imaging studies  CBC:  Recent Labs Lab 05/18/16 1145  WBC 7.9  NEUTROABS 7.1  HGB 11.9*  HCT 37.0*  MCV 91.6  PLT 1982   Basic Metabolic Panel:  Recent Labs Lab 05/18/16 1145  NA 138  K 3.3*  CL 99*  CO2 31  GLUCOSE 138*  BUN 8  CREATININE 0.81  CALCIUM 8.9   GFR: Estimated Creatinine Clearance: 100.4 mL/min (by C-G formula based on Cr of 0.81). Liver Function Tests:  Recent Labs Lab 05/18/16 1145  AST 18  ALT 16*  ALKPHOS 72  BILITOT 0.7  PROT 6.5  ALBUMIN 3.7   No results for input(s): LIPASE, AMYLASE in the last 168 hours. No results for  input(s): AMMONIA in the last 168 hours. Coagulation Profile: No results for input(s): INR, PROTIME in the last 168 hours. Cardiac Enzymes:  Recent Labs Lab 05/18/16 1145  TROPONINI <0.03   BNP (last 3 results) No results for input(s): PROBNP in the last 8760 hours. HbA1C: No results for input(s): HGBA1C in the last 72 hours. CBG: No results for input(s): GLUCAP in the last 168 hours. Lipid Profile: No results for input(s): CHOL, HDL, LDLCALC, TRIG, CHOLHDL, LDLDIRECT in the last 72 hours. Thyroid Function Tests: No results for input(s): TSH, T4TOTAL, FREET4, T3FREE, THYROIDAB in the last 72 hours. Anemia Panel: No results for input(s): VITAMINB12, FOLATE, FERRITIN, TIBC, IRON, RETICCTPCT in the last 72 hours. Urine analysis:    Component Value Date/Time   COLORURINE YELLOW 04/21/2016 2132   APPEARANCEUR CLEAR 04/21/2016 2132   LABSPEC 1.010 04/21/2016 2132   PHURINE 5.5 04/21/2016 2132   GLUCOSEU 500* 04/21/2016 2132   GLUCOSEU NEGATIVE 10/05/2015 0958   HGBUR NEGATIVE 04/21/2016 2132   BILIRUBINUR NEGATIVE 04/21/2016 2132   BILIRUBINUR n 08/14/2011 [place]05/28/2017 2132   PROTEINUR NEGATIVE 04/21/2016 2132   PROTEINUR n 08/14/2011 1449   UROBILINOGEN 0.2 10/05/2015 0958   UROBILINOGEN 0.2 08/14/2011 1449   NITRITE NEGATIVE 04/21/2016 2132   NITRITE n 08/14/2011 1449  LEUKOCYTESUR NEGATIVE 04/21/2016 2132    Creatinine Clearance: Estimated Creatinine Clearance: 100.4 mL/min (by C-G formula based on Cr of 0.81).  Sepsis Labs: @LABRCNTIP (procalcitonin:4,lacticidven:4) )No results found for this or any previous visit (from the past 240 hour(s)).   Radiological Exams on Admission: Dg Chest 2 View  05/18/2016  CLINICAL DATA:  Shortness of breath.  Lung cancer. EXAM: CHEST  2 VIEW COMPARISON:  04/21/2016 chest radiograph.  04/22/2016 chest CT . FINDINGS: Surgical hardware from ACDF overlies the lower cervical spine. Stable cardiomediastinal  silhouette with normal heart size. No pneumothorax. No pleural effusion. Significant growth of 6.3 cm right lower lobe lung mass. Stable left apical pleural capping and distortion. No pulmonary edema. No acute consolidative airspace disease. IMPRESSION: Significant growth of 6.3 cm right lower lobe lung mass, suggesting either a lung metastasis or metachronous primary lung cancer. Stable post treatment changes in the left upper lung. Electronically Signed   By: Ilona Sorrel M.D.   On: 05/18/2016 11:54      Assessment/Plan Active Problems:   Depression with anxiety   Essential hypertension   GERD   Non-small cell carcinoma of lung, right (HCC)   Chronic pain syndrome   Diabetes mellitus without complication ([place])   Coronary artery disease involving native coronary artery of native heart without angina pectoris   HCAP (healthcare-associated pneumonia)   COPD exacerbation (HCC)   Acute on chronic respiratory failure (HCC)   Acute on chronic respiratory faliure: likely multifactorial from acute on chronic COPD exacerbation, progressive lung cancer, with superimposed HCAP. WHile hot immediately clear based on objective findings, pt is at very high risk for pneumonia given progressive cancer, recent hospitalization, severe COPD and active chemo administration. ABG showing pH of 7.42, PCO2 49.9, PO2 60.2, bicarbonate 32, WBC 7.9 with left shift. AF VSS - med surge - Vanc Cefepime - sputum CX, BCX - PNeumonia order set - Repeat ABG at 4 hours - 02 continuous, is a cannula initially with BiPAP if needed - Mucinex - CXR in a.m. - DuoNeb every 4 - Solu-Medrol 1251 then prednisone 60 every morning. Consider returning to home dose of 5 mg daily once no longer acutely in respiratory failure. - Contineu theophylline - resume Advair, spiriva once at baseline  HypoK: 3.3 - Kdur 88mq x1 - am Mag and BMET - resume home Kdur  Non-small cell carcinoma lung: progressive disease despite recent chemo.  Per lst ONcology note, pt is to change therapy to Nivolumab.  - Oncology consult  CAD: - continue asa, plavix, lipitor  Diabetes: - SSI  Chronic pain: - continue MS contin and immediate release Morphine  HTN: - Continue Imdur, Metop  GERD: - Continue Protonix  BPH: - continue Flomax  Insomnia: - continue armodafinil  Depression/Anxiety: - contniue prozac, remeron, abilify, klonopin   DVT prophylaxis: Lovenox  Code Status: FULL  Family Communication: Wife  Disposition Plan: pending improvement   Consults called: oncology  Admission status: observatoin    MERRELL, DAVID J MD [facility]  If 7PM-7AM, please contact night-coverage [email or web address] Password [email or web address]  05/18/2016, 2:05 PM

## 2016-05-19 ENCOUNTER — Observation Stay (HOSPITAL_COMMUNITY): Payer: Commercial Managed Care - HMO

## 2016-05-19 DIAGNOSIS — J441 Chronic obstructive pulmonary disease with (acute) exacerbation: Secondary | ICD-10-CM | POA: Diagnosis present

## 2016-05-19 DIAGNOSIS — G894 Chronic pain syndrome: Secondary | ICD-10-CM | POA: Diagnosis present

## 2016-05-19 DIAGNOSIS — Z981 Arthrodesis status: Secondary | ICD-10-CM | POA: Diagnosis not present

## 2016-05-19 DIAGNOSIS — Z7902 Long term (current) use of antithrombotics/antiplatelets: Secondary | ICD-10-CM | POA: Diagnosis not present

## 2016-05-19 DIAGNOSIS — I2510 Atherosclerotic heart disease of native coronary artery without angina pectoris: Secondary | ICD-10-CM | POA: Diagnosis present

## 2016-05-19 DIAGNOSIS — G4700 Insomnia, unspecified: Secondary | ICD-10-CM | POA: Diagnosis present

## 2016-05-19 DIAGNOSIS — G4733 Obstructive sleep apnea (adult) (pediatric): Secondary | ICD-10-CM | POA: Diagnosis present

## 2016-05-19 DIAGNOSIS — E119 Type 2 diabetes mellitus without complications: Secondary | ICD-10-CM | POA: Diagnosis present

## 2016-05-19 DIAGNOSIS — E876 Hypokalemia: Secondary | ICD-10-CM | POA: Diagnosis present

## 2016-05-19 DIAGNOSIS — J189 Pneumonia, unspecified organism: Secondary | ICD-10-CM | POA: Diagnosis present

## 2016-05-19 DIAGNOSIS — Z794 Long term (current) use of insulin: Secondary | ICD-10-CM | POA: Diagnosis not present

## 2016-05-19 DIAGNOSIS — I10 Essential (primary) hypertension: Secondary | ICD-10-CM | POA: Diagnosis present

## 2016-05-19 DIAGNOSIS — Z825 Family history of asthma and other chronic lower respiratory diseases: Secondary | ICD-10-CM | POA: Diagnosis not present

## 2016-05-19 DIAGNOSIS — E785 Hyperlipidemia, unspecified: Secondary | ICD-10-CM | POA: Diagnosis present

## 2016-05-19 DIAGNOSIS — J9622 Acute and chronic respiratory failure with hypercapnia: Secondary | ICD-10-CM | POA: Diagnosis present

## 2016-05-19 DIAGNOSIS — K219 Gastro-esophageal reflux disease without esophagitis: Secondary | ICD-10-CM | POA: Diagnosis present

## 2016-05-19 DIAGNOSIS — Z9981 Dependence on supplemental oxygen: Secondary | ICD-10-CM | POA: Diagnosis not present

## 2016-05-19 DIAGNOSIS — Z7982 Long term (current) use of aspirin: Secondary | ICD-10-CM | POA: Diagnosis not present

## 2016-05-19 DIAGNOSIS — R0902 Hypoxemia: Secondary | ICD-10-CM | POA: Diagnosis present

## 2016-05-19 DIAGNOSIS — J9621 Acute and chronic respiratory failure with hypoxia: Secondary | ICD-10-CM | POA: Diagnosis present

## 2016-05-19 DIAGNOSIS — Y95 Nosocomial condition: Secondary | ICD-10-CM | POA: Diagnosis present

## 2016-05-19 DIAGNOSIS — C3491 Malignant neoplasm of unspecified part of right bronchus or lung: Secondary | ICD-10-CM | POA: Diagnosis present

## 2016-05-19 DIAGNOSIS — Z8249 Family history of ischemic heart disease and other diseases of the circulatory system: Secondary | ICD-10-CM | POA: Diagnosis not present

## 2016-05-19 DIAGNOSIS — J440 Chronic obstructive pulmonary disease with acute lower respiratory infection: Secondary | ICD-10-CM | POA: Diagnosis present

## 2016-05-19 DIAGNOSIS — Z87891 Personal history of nicotine dependence: Secondary | ICD-10-CM | POA: Diagnosis not present

## 2016-05-19 DIAGNOSIS — Z923 Personal history of irradiation: Secondary | ICD-10-CM | POA: Diagnosis not present

## 2016-05-19 DIAGNOSIS — F418 Other specified anxiety disorders: Secondary | ICD-10-CM

## 2016-05-19 LAB — CBC
HCT: 37.4 % — ABNORMAL LOW (ref 39.0–52.0)
Hemoglobin: 11.6 g/dL — ABNORMAL LOW (ref 13.0–17.0)
MCH: 29.4 pg (ref 26.0–34.0)
MCHC: 31 g/dL (ref 30.0–36.0)
MCV: 94.9 fL (ref 78.0–100.0)
PLATELETS: 160 10*3/uL (ref 150–400)
RBC: 3.94 MIL/uL — ABNORMAL LOW (ref 4.22–5.81)
RDW: 18.7 % — AB (ref 11.5–15.5)
WBC: 8.1 10*3/uL (ref 4.0–10.5)

## 2016-05-19 LAB — GLUCOSE, CAPILLARY
GLUCOSE-CAPILLARY: 164 mg/dL — AB (ref 65–99)
Glucose-Capillary: 125 mg/dL — ABNORMAL HIGH (ref 65–99)
Glucose-Capillary: 142 mg/dL — ABNORMAL HIGH (ref 65–99)
Glucose-Capillary: 220 mg/dL — ABNORMAL HIGH (ref 65–99)

## 2016-05-19 LAB — COMPREHENSIVE METABOLIC PANEL
ALT: 17 U/L (ref 17–63)
AST: 20 U/L (ref 15–41)
Albumin: 3.4 g/dL — ABNORMAL LOW (ref 3.5–5.0)
Alkaline Phosphatase: 67 U/L (ref 38–126)
Anion gap: 6 (ref 5–15)
BILIRUBIN TOTAL: 0.7 mg/dL (ref 0.3–1.2)
BUN: 9 mg/dL (ref 6–20)
CHLORIDE: 102 mmol/L (ref 101–111)
CO2: 34 mmol/L — ABNORMAL HIGH (ref 22–32)
Calcium: 9.3 mg/dL (ref 8.9–10.3)
Creatinine, Ser: 0.77 mg/dL (ref 0.61–1.24)
Glucose, Bld: 133 mg/dL — ABNORMAL HIGH (ref 65–99)
POTASSIUM: 3.7 mmol/L (ref 3.5–5.1)
Sodium: 142 mmol/L (ref 135–145)
TOTAL PROTEIN: 6.4 g/dL — AB (ref 6.5–8.1)

## 2016-05-19 LAB — HIV ANTIBODY (ROUTINE TESTING W REFLEX): HIV Screen 4th Generation wRfx: NONREACTIVE

## 2016-05-19 MED ORDER — METHYLPREDNISOLONE SODIUM SUCC 40 MG IJ SOLR
40.0000 mg | Freq: Two times a day (BID) | INTRAMUSCULAR | Status: DC
  Administered 2016-05-19 – 2016-05-22 (×7): 40 mg via INTRAVENOUS
  Filled 2016-05-19 (×9): qty 1

## 2016-05-19 NOTE — Care Management Obs Status (Signed)
[facility] NOTIFICATION   Patient Details  Name: Ernest [name] MRN: [id number] Date of Birth: 03-Jun-1956   Medicare Observation Status Notification Given:  Yes    [name], RN 05/19/2016, 6:26 PM

## 2016-05-19 NOTE — Progress Notes (Signed)
PROGRESS NOTE    Ernest [name]  GGE:[id number] DOB: 12/29/1955 DOA: 05/18/2016 PCP: [name], MD    Brief Narrative:   60 y.o. 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 Will discontinue oral prednisone place on IV Solu-Medrol. Patient still had some significant bilateral wheeze.  Lung mass -Has been progressive despite chemotherapy. After resolution of infection we'll plan on having patient follow-up with oncology for further evaluation and recommendations.    HCAP (healthcare-associated pneumonia) -Continue current antibiotic regimen.  Depression with anxiety -Stable on Prozac    Essential hypertension - Continue Imdur, metoprolol    GERD - Stable on Protonix    Chronic pain syndrome - Continue supportive therapy and current regimen   Diabetes mellitus without complication (HCC) - Continue sliding scale insulin - We'll continue to monitor blood sugars    Coronary artery disease involving native coronary artery of native heart without angina pectoris - Stable continue current regimen patient is on Plavix and aspirin as well as Lipitor.     DVT prophylaxis: Lovenox Code Status: Full Family Communication: Discussed directly with patient no family at bedside Disposition Plan: Pending improvement in condition   Consultants:   None   Procedures: None   Antimicrobials: Vancomycin and cefepime   Subjective: Patient reports feeling about the same today.  Objective: Filed Vitals:   05/19/16 0428 05/19/16 0611 05/19/16 0757 05/19/16 1159  BP:  143/67      Pulse:  65    Temp:  98 F (36.7 C)    TempSrc:  Oral    Resp:  22    Height:      Weight:      SpO2: 95% 99% 100% 100%    Intake/Output Summary (Last 24 hours) at 05/19/16 1353 Last data filed at 05/19/16 1339  Gross per 24 hour  Intake   1800 ml  Output   2600 ml  Net   -800 ml   Filed Weights   05/18/16 1440  Weight: 85.2 kg (187 lb 13.3 oz)    Examination:  General exam: Appears calm and comfortable , In no acute distress Respiratory system: Increased work of breathing, nasal cannula in place, diffuse bilateral expiratory wheezes, equal chest rise Cardiovascular system: S1 & S2 heard, RRR. No  murmurs, rubs, gallops or clicks. Gastrointestinal system: Abdomen is nondistended, soft and nontender. No organomegaly or masses felt. Normal bowel sounds heard. Obese Central nervous system: Alert and oriented. No focal neurological deficits. Extremities: Symmetric 5 x 5 power. Skin: No rashes, lesions or ulcers on limited exam Psychiatry: Judgement and insight appear normal. Mood & affect appropriate.   Data Reviewed: I have personally reviewed following labs and imaging studies  CBC:  Recent Labs Lab 05/18/16 1145 05/19/16 0542  WBC 7.9 8.1  NEUTROABS 7.1  --   HGB 11.9* 11.6*  HCT 37.0* 37.4*  MCV 91.6 94.9  PLT 146* 465   Basic Metabolic Panel:  Recent Labs Lab 05/18/16 1145 05/19/16 0542  NA 138 142  K 3.3* 3.7  CL 99* 102  CO2 31 34*  GLUCOSE 138* 133*  BUN 8 9  CREATININE 0.81 0.77  CALCIUM 8.9 9.3  GFR: Estimated Creatinine Clearance: 93.5 mL/min (by C-G formula based on Cr of 0.77). Liver Function Tests:  Recent Labs Lab 05/18/16 1145 05/19/16 0542  AST 18 20  ALT 16* 17  ALKPHOS 72 67  BILITOT 0.7 0.7  PROT 6.5 6.4*  ALBUMIN 3.7 3.4*   No results for input(s): LIPASE, AMYLASE in the last 168 hours. No results for input(s): AMMONIA in the last 168 hours. Coagulation Profile: No results for input(s): INR, PROTIME in the last 168  hours. Cardiac Enzymes:  Recent Labs Lab 05/18/16 1145  TROPONINI <0.03   BNP (last 3 results) No results for input(s): PROBNP in the last 8760 hours. HbA1C: No results for input(s): HGBA1C in the last 72 hours. CBG:  Recent Labs Lab 05/18/16 1828 05/18/16 2158 05/19/16 0715 05/19/16 1146  GLUCAP 222* 157* 125* 142*   Lipid Profile: No results for input(s): CHOL, HDL, LDLCALC, TRIG, CHOLHDL, LDLDIRECT in the last 72 hours. Thyroid Function Tests: No results for input(s): TSH, T4TOTAL, FREET4, T3FREE, THYROIDAB in the last 72 hours. Anemia Panel: No results for input(s): VITAMINB12, FOLATE, FERRITIN, TIBC, IRON, RETICCTPCT in the last 72 hours. Sepsis Labs: No results for input(s): PROCALCITON, LATICACIDVEN in the last 168 hours.  Recent Results (from the past 240 hour(s))  Culture, blood (routine x 2)     Status: None (Preliminary result)   Collection Time: 05/18/16  2:20 PM  Result Value Ref Range Status   Specimen Description BLOOD LEFT HAND  Final   Special Requests BOTTLES DRAWN AEROBIC AND ANAEROBIC 5 CC EACH  Final   Culture   Final    NO GROWTH < 24 HOURS Performed at [facility]    Report Status PENDING  Incomplete  Culture, blood (routine x 2)     Status: None (Preliminary result)   Collection Time: 05/18/16  2:20 PM  Result Value Ref Range Status   Specimen Description BLOOD RIGHT ANTECUBITAL  Final   Special Requests BOTTLES DRAWN AEROBIC AND ANAEROBIC 5 CC EACH  Final   Culture   Final    NO GROWTH < 24 HOURS Performed at [facility]    Report Status PENDING  Incomplete  Culture, sputum-assessment     Status: None   Collection Time: 05/18/16  5:40 PM  Result Value Ref Range Status   Specimen Description Expect. Sput  Final   Special Requests NONE  Final   Sputum evaluation   Final    THIS SPECIMEN IS ACCEPTABLE. RESPIRATORY CULTURE REPORT TO FOLLOW.   Report Status 05/18/2016 FINAL  Final  Culture, respiratory (NON-Expectorated)      Status: None (Preliminary result)   Collection Time: 05/18/16  5:40 PM  Result Value Ref Range Status   Specimen Description SPUTUM  Final   Special Requests NONE  Final   Gram Stain   Final    MODERATE WBC PRESENT, PREDOMINANTLY PMN FEW SQUAMOUS EPITHELIAL CELLS PRESENT MODERATE GRAM POSITIVE COCCI IN PAIRS AND CHAINS RARE BUDDING YEAST SEEN Performed at [facility]    Culture PENDING  Incomplete   Report Status PENDING  Incomplete         Radiology Studies: Dg Chest 2 View  05/18/2016  CLINICAL DATA:  Shortness of breath.  Lung cancer. EXAM: CHEST  2 VIEW COMPARISON:  04/21/2016 chest radiograph.  04/22/2016 chest CT . FINDINGS: Surgical hardware from [facility]CD[facility] overlies the lower cervical spine. Stable cardiomediastinal silhouette with normal heart size. No pneumothorax. No pleural effusion. Significant growth of 6.3 cm right lower  lobe lung mass. Stable left apical pleural capping and distortion. No pulmonary edema. No acute consolidative airspace disease. IMPRESSION: Significant growth of 6.3 cm right lower lobe lung mass, suggesting either a lung metastasis or metachronous primary lung cancer. Stable post treatment changes in the left upper lung. Electronically Signed   By: Ilona Sorrel M.D.   On: 05/18/2016 11:54   Dg Chest Port 1 View  05/19/2016  CLINICAL DATA:  Healthcare associated pneumonia, hypertension, COPD, diabetes mellitus, former smoker, lung cancer EXAM: PORTABLE CHEST 1 VIEW COMPARISON:  Portable exam 0519 hours compared to 05/18/2016 and correlated with CT chest of 04/22/2016 FINDINGS: Patient's chin obscures the lung apices. Normal heart size, mediastinal contours, and pulmonary vascularity. LEFT apex scarring and volume loss with superior retraction of the LEFT hilum. Improved atelectasis at lower RIGHT lung. Underlying COPD. Again identified mass at inferior RIGHT hemithorax 6.6 x 5.1 cm. No acute infiltrate, pleural effusion, or pneumothorax. IMPRESSION:  Persistent RIGHT lower lobe mass. Improved RIGHT basilar atelectasis. COPD changes with chronic scarring at LEFT apex. Electronically Signed   By: Lavonia Dana M.D.   On: 05/19/2016 08:47        Scheduled Meds: . ARIPiprazole  5 mg Oral QHS  . aspirin EC  81 mg Oral Daily  . atorvastatin  20 mg Oral Daily  . ceFEPime (MAXIPIME) IV  1 g Intravenous Q8H  . ceFEPime (MAXIPIME) IV  2 g Intravenous NOW  . clopidogrel  75 mg Oral Daily  . dextromethorphan-guaiFENesin  1 tablet Oral BID  . enoxaparin (LOVENOX) injection  40 mg Subcutaneous Q24H  . feeding supplement (ENSURE ENLIVE)  237 mL Oral BID BM  . FLORA-Q  1 capsule Oral Daily  . FLUoxetine  40 mg Oral q morning - 10a  . gabapentin  100 mg Oral Daily  . insulin aspart  0-5 Units Subcutaneous QHS  . insulin aspart  0-9 Units Subcutaneous TID WC  . ipratropium  0.5 mg Nebulization Once  . ipratropium-albuterol  3 mL Nebulization Q4H  . isosorbide mononitrate  120 mg Oral Daily  . methylPREDNISolone (SOLU-MEDROL) injection  40 mg Intravenous Q12H  . metoprolol succinate  12.5 mg Oral Daily  . mirtazapine  45 mg Oral QHS  . modafinil  200 mg Oral Daily  . morphine  30 mg Oral Q12H  . pantoprazole  80 mg Oral Daily  . potassium chloride SA  20 mEq Oral q morning - 10a  . tamsulosin  0.4 mg Oral Daily  . theophylline  400 mg Oral Daily  . vancomycin  1,000 mg Intravenous Q8H   Continuous Infusions: . sodium chloride 50 mL/hr at 05/18/16 1534    Time spent: > 35    Velvet Bathe, MD [facility] Pager [phone]  If 7PM-7AM, please contact night-coverage [email or web address] Password [email or web address] 05/19/2016, 1:53 PM

## 2016-05-20 ENCOUNTER — Ambulatory Visit: Payer: Self-pay | Admitting: *Deleted

## 2016-05-20 ENCOUNTER — Other Ambulatory Visit: Payer: Self-pay | Admitting: *Deleted

## 2016-05-20 DIAGNOSIS — I10 Essential (primary) hypertension: Secondary | ICD-10-CM

## 2016-05-20 LAB — GLUCOSE, CAPILLARY
GLUCOSE-CAPILLARY: 209 mg/dL — AB (ref 65–99)
Glucose-Capillary: 122 mg/dL — ABNORMAL HIGH (ref 65–99)
Glucose-Capillary: 181 mg/dL — ABNORMAL HIGH (ref 65–99)
Glucose-Capillary: 225 mg/dL — ABNORMAL HIGH (ref 65–99)

## 2016-05-20 LAB — LEGIONELLA PNEUMOPHILA SEROGP 1 UR AG: L. pneumophila Serogp 1 Ur Ag: NEGATIVE

## 2016-05-20 LAB — VANCOMYCIN, TROUGH: VANCOMYCIN TR: 17 ug/mL (ref 10.0–20.0)

## 2016-05-20 MED ORDER — IPRATROPIUM-ALBUTEROL 0.5-2.5 (3) MG/3ML IN SOLN
3.0000 mL | RESPIRATORY_TRACT | Status: DC
  Administered 2016-05-20 – 2016-05-23 (×16): 3 mL via RESPIRATORY_TRACT
  Filled 2016-05-20 (×16): qty 3

## 2016-05-20 NOTE — Progress Notes (Signed)
PROGRESS NOTE    Ernest [name]  CWC:[id number] DOB: [birth year] DOA: 05/18/2016 PCP: [name], MD    Brief Narrative:   60 y.o. male with medical history significant of coronary artery disease with stress test performed 2016 without significant ischemic disease present, severe COPD on 2 L nasal cannula continuously, HLD, insomnia, GERD, peripheral vascular disease, HTN, diabetes, non-small cell cancer lung presenting with increasing SOB  Assessment & Plan:   Active Problems:     COPD exacerbation ([place]) - We'll continue current regimen except for the following change.  - Improving on IV Solu-Medrol. Will continue as patient still has bilateral expiratory wheeze  Lung mass - Has been progressive despite chemotherapy. After resolution of infection we'll plan on having patient follow-up with oncology for further evaluation and recommendations.    HCAP (healthcare-associated pneumonia) - Continue current antibiotic regimen.  Depression with anxiety - Stable on Prozac    Essential hypertension - Continue Imdur, metoprolol    GERD - Stable on Protonix    Chronic pain syndrome - Continue supportive therapy and current regimen    Diabetes mellitus without complication (HCC) - Continue sliding scale insulin - We'll continue to monitor blood sugars    Coronary artery disease involving native coronary artery of native heart without angina pectoris - Stable continue current regimen patient is on Plavix and aspirin as well as Lipitor.  DVT prophylaxis: Lovenox Code Status: Full Family Communication: Discussed directly with patient no family at bedside Disposition Plan: Pending improvement in condition   Consultants:   None   Procedures: None   Antimicrobials: Vancomycin and cefepime   Subjective: Patient reports feeling about the same today.  Objective: Filed Vitals:   05/20/16 0439 05/20/16 0548 05/20/16 1016 05/20/16 1454  BP:  156/75  146/65  Pulse:   67  62  Temp:  97.7 F (36.5 C)  97.8 F (36.6 C)  TempSrc:  Oral  Oral  Resp:  21  20  Height:      Weight:      SpO2: 97% 98% 99% 99%    Intake/Output Summary (Last 24 hours) at 05/20/16 1517 Last data filed at 05/20/16 1320  Gross per 24 hour  Intake    720 ml  Output   2100 ml  Net  -1380 ml   Filed Weights   05/18/16 1440  Weight: 85.2 kg (187 lb 13.3 oz)    Examination:  General exam: Appears calm and comfortable , In no acute distress Respiratory system: Increased work of breathing, nasal cannula in place, diffuse bilateral expiratory wheezes, equal chest rise Cardiovascular system: S1 & S2 heard, RRR. No  murmurs, rubs, gallops or clicks. Gastrointestinal system: Abdomen is nondistended, soft and nontender. No organomegaly or masses felt. Normal bowel sounds heard. Obese Central nervous system: Alert and oriented. No focal neurological deficits. Extremities: Symmetric 5 x 5 power. Skin: No rashes, lesions or ulcers on limited exam Psychiatry: Judgement and insight appear normal. Mood & affect appropriate.   Data Reviewed: I have personally reviewed following labs and imaging studies  CBC:  Recent Labs Lab 05/18/16 1145 05/19/16 0542  WBC 7.9 8.1  NEUTROABS 7.1  --   HGB 11.9* 11.6*  HCT 37.0* 37.4*  MCV 91.6 94.9  PLT 146* 761   Basic Metabolic Panel:  Recent Labs Lab 05/18/16 1145 05/19/16 0542  NA 138 142  K 3.3* 3.7  CL 99* 102  CO2 31 34*  GLUCOSE 138* 133*  BUN 8 9  CREATININE 0.81 0.77  CALCIUM 8.9 9.3   GFR: Estimated Creatinine Clearance: 93.5 mL/min (by C-G formula based on Cr of 0.77). Liver Function Tests:  Recent Labs Lab 05/18/16 1145 05/19/16 0542  AST 18 20  ALT 16* 17  ALKPHOS 72 67  BILITOT 0.7 0.7  PROT 6.5 6.4*  ALBUMIN 3.7 3.4*   No results for input(s): LIPASE, AMYLASE in the last 168 hours. No results for input(s): AMMONIA in the last 168 hours. Coagulation Profile: No results for input(s): INR, PROTIME in  the last 168 hours. Cardiac Enzymes:  Recent Labs Lab 05/18/16 1145  TROPONINI <0.03   BNP (last 3 results) No results for input(s): PROBNP in the last 8760 hours. HbA1C: No results for input(s): HGBA1C in the last 72 hours. CBG:  Recent Labs Lab 05/19/16 1146 05/19/16 1631 05/19/16 2200 05/20/16 0759 05/20/16 1208  GLUCAP 142* 164* 220* 209* 122*   Lipid Profile: No results for input(s): CHOL, HDL, LDLCALC, TRIG, CHOLHDL, LDLDIRECT in the last 72 hours. Thyroid Function Tests: No results for input(s): TSH, T4TOTAL, FREET4, T3FREE, THYROIDAB in the last 72 hours. Anemia Panel: No results for input(s): VITAMINB12, FOLATE, FERRITIN, TIBC, IRON, RETICCTPCT in the last 72 hours. Sepsis Labs: No results for input(s): PROCALCITON, LATICACIDVEN in the last 168 hours.  Recent Results (from the past 240 hour(s))  Culture, blood (routine x 2)     Status: None (Preliminary result)   Collection Time: 05/18/16  2:20 PM  Result Value Ref Range Status   Specimen Description BLOOD LEFT HAND  Final   Special Requests BOTTLES DRAWN AEROBIC AND ANAEROBIC 5 CC EACH  Final   Culture   Final    NO GROWTH 2 DAYS Performed at [facility]    Report Status PENDING  Incomplete  Culture, blood (routine x 2)     Status: None (Preliminary result)   Collection Time: 05/18/16  2:20 PM  Result Value Ref Range Status   Specimen Description BLOOD RIGHT ANTECUBITAL  Final   Special Requests BOTTLES DRAWN AEROBIC AND ANAEROBIC 5 CC EACH  Final   Culture   Final    NO GROWTH 2 DAYS Performed at [facility]    Report Status PENDING  Incomplete  Culture, sputum-assessment     Status: None   Collection Time: 05/18/16  5:40 PM  Result Value Ref Range Status   Specimen Description Expect. Sput  Final   Special Requests NONE  Final   Sputum evaluation   Final    THIS SPECIMEN IS ACCEPTABLE. RESPIRATORY CULTURE REPORT TO FOLLOW.   Report Status 05/18/2016 FINAL  Final  Culture,  respiratory (NON-Expectorated)     Status: None (Preliminary result)   Collection Time: 05/18/16  5:40 PM  Result Value Ref Range Status   Specimen Description SPUTUM  Final   Special Requests NONE  Final   Gram Stain   Final    MODERATE WBC PRESENT, PREDOMINANTLY PMN FEW SQUAMOUS EPITHELIAL CELLS PRESENT MODERATE GRAM POSITIVE COCCI IN PAIRS AND CHAINS RARE BUDDING YEAST SEEN    Culture   Final    CULTURE REINCUBATED FOR BETTER GROWTH Performed at [facility]    Report Status PENDING  Incomplete         Radiology Studies: Dg Chest Port 1 View  05/19/2016  CLINICAL DATA:  Healthcare associated pneumonia, hypertension, COPD, diabetes mellitus, former smoker, lung cancer EXAM: PORTABLE CHEST 1 VIEW COMPARISON:  Portable exam 0519 hours compared to 05/18/2016 and correlated with CT chest of 04/22/2016 FINDINGS: Patient's  chin obscures the lung apices. Normal heart size, mediastinal contours, and pulmonary vascularity. LEFT apex scarring and volume loss with superior retraction of the LEFT hilum. Improved atelectasis at lower RIGHT lung. Underlying COPD. Again identified mass at inferior RIGHT hemithorax 6.6 x 5.1 cm. No acute infiltrate, pleural effusion, or pneumothorax. IMPRESSION: Persistent RIGHT lower lobe mass. Improved RIGHT basilar atelectasis. COPD changes with chronic scarring at LEFT apex. Electronically Signed   By: Lavonia Dana M.D.   On: 05/19/2016 08:47        Scheduled Meds: . ARIPiprazole  5 mg Oral QHS  . aspirin EC  81 mg Oral Daily  . atorvastatin  20 mg Oral Daily  . ceFEPime (MAXIPIME) IV  1 g Intravenous Q8H  . clopidogrel  75 mg Oral Daily  . dextromethorphan-guaiFENesin  1 tablet Oral BID  . enoxaparin (LOVENOX) injection  40 mg Subcutaneous Q24H  . feeding supplement (ENSURE ENLIVE)  237 mL Oral BID BM  . FLORA-Q  1 capsule Oral Daily  . FLUoxetine  40 mg Oral q morning - 10a  . gabapentin  100 mg Oral Daily  . insulin aspart  0-5 Units  Subcutaneous QHS  . insulin aspart  0-9 Units Subcutaneous TID WC  . ipratropium  0.5 mg Nebulization Once  . ipratropium-albuterol  3 mL Nebulization Q4H  . isosorbide mononitrate  120 mg Oral Daily  . methylPREDNISolone (SOLU-MEDROL) injection  40 mg Intravenous Q12H  . metoprolol succinate  12.5 mg Oral Daily  . mirtazapine  45 mg Oral QHS  . modafinil  200 mg Oral Daily  . morphine  30 mg Oral Q12H  . pantoprazole  80 mg Oral Daily  . potassium chloride SA  20 mEq Oral q morning - 10a  . tamsulosin  0.4 mg Oral Daily  . theophylline  400 mg Oral Daily  . vancomycin  1,000 mg Intravenous Q8H   Continuous Infusions: . sodium chloride 50 mL/hr at 05/19/16 1400    Time spent: > 35  Velvet Bathe, MD [facility] Pager [phone]  If 7PM-7AM, please contact night-coverage [email or web address] Password [facility] 05/20/2016, 3:17 PM

## 2016-05-20 NOTE — Progress Notes (Signed)
Pharmacy Antibiotic Note  Ernest [name] is a 60 y.o. male admitted on 05/18/2016 with lung cancer being treated with nivolumab PTA.  He presented to the ED on 6/24 with c/o SOB.  Vancomycin and cefepime were started on admission for PNA.  - 6/25 CXR: persistent R LL mass; improved R basilar atelectasis - afeb, wbc wnl, scr 0.77 (crcl~93)   Plan: - vancomycin trough level drawn today is therapeutic at 17 (goal 15-20).  Continue vancomycin 1 gm IV q8h for now - continue cefepime 1gm IV q8h  _____________________   Height: '5\' 3"'$  (160 cm) Weight: 187 lb 13.3 oz (85.2 kg) IBW/kg (Calculated) : 56.9  Temp (24hrs), Avg:97.8 F (36.6 C), Min:97.7 F (36.5 C), Max:97.9 F (36.6 C)   Recent Labs Lab 05/18/16 1145 05/19/16 0542 05/20/16 1127  WBC 7.9 8.1  --   CREATININE 0.81 0.77  --   VANCOTROUGH  --   --  17    Estimated Creatinine Clearance: 93.5 mL/min (by C-G formula based on Cr of 0.77).    Allergies  Allergen Reactions  . Carboplatin Shortness Of Breath, Swelling and Rash    Swelling of lips, rash on face,eyes and head    Antimicrobials this admission: 6/24 Vanc >> 6/25 Cefepime (not given in ED - first dose 10 hrs later)>>  Dose adjustments this admission: 6/26 VT at 1130 = 17 (on 1gm q8h)  Microbiology results: 6/24 Sputum: mod GPCs in pairs/chains, rare yeast  6/24 bcx x2:   5/28 sputum: MSSA  Thank you for allowing pharmacy to be a part of this patient's care.  Lynelle Doctor 05/20/2016 12:44 PM

## 2016-05-20 NOTE — Patient Outreach (Signed)
[facility]) Care Management [place] Coordination note 05/20/2016  Ernest [name] [birth year] [id number]   Secure communication sent to [facility] liaisons notifying of Ernest [name] most recent hospital admission on May 18, 2016.  [facility] Community RN CM will continue following this patient for ongoing transition of care at the time of hospital discharge.  [name], RN, BSN, [employer] [facility] Care Management  [phone]

## 2016-05-20 NOTE — Progress Notes (Signed)
Initial Nutrition Assessment  DOCUMENTATION CODES:   Obesity unspecified  INTERVENTION:   Continue Ensure Enlive po BID, each supplement provides 350 kcal and 20 grams of protein Encourage PO intake RD to monitor for additional nutrition needs  NUTRITION DIAGNOSIS:   Inadequate oral intake related to poor appetite as evidenced by per patient/family report.  GOAL:   Patient will meet greater than or equal to 90% of their needs  MONITOR:   PO intake, Labs, Weight trends, I & O's  REASON FOR ASSESSMENT:   Malnutrition Screening Tool    ASSESSMENT:   60 y.o. male with medical history significant of coronary artery disease with stress test performed 2016 without significant ischemic disease present, severe COPD on 2 L nasal cannula continuously, HLD, insomnia, GERD, peripheral vascular disease, HTN, diabetes, non-small cell cancer lung presenting with increasing SOB  Patient reports poor appetite for the last 2 weeks. Denies any taste changes, swallowing or chewing issues. Pt states he has ordered lunch, pot roast, potato salad, rice and a roll. A [employer] bag was sitting in his room. Pt has been ordered Ensure supplements. PO intake: 100%.  Per weight history, pt has lost 18 lb since 3/22 (9% wt loss x 3 months, significant for time frame). Nutrition focused physical exam shows no sign of depletion of muscle mass or body fat.  Medications: Remeron tablet daily, K-DUR tablet daily Labs reviewed: CBGs: 122-209  Diet Order:  Diet regular Room service appropriate?: Yes; Fluid consistency:: Thin  Skin:  Reviewed, no issues  Last BM:  6/24  Height:   Ht Readings from Last 1 Encounters:  05/18/16 '5\' 3"'$  (1.6 m)    Weight:   Wt Readings from Last 1 Encounters:  05/18/16 187 lb 13.3 oz (85.2 kg)    Ideal Body Weight:  56.3 kg  BMI:  Body mass index is 33.28 kg/(m^2).  Estimated Nutritional Needs:   Kcal:  1600-1800  Protein:  65-75g  Fluid:   1.8L/day  EDUCATION NEEDS:   No education needs identified at this time  Clayton Bibles, MS, RD, LDN Pager: [phone] After Hours Pager: [phone]

## 2016-05-21 ENCOUNTER — Ambulatory Visit: Payer: Commercial Managed Care - HMO | Admitting: Internal Medicine

## 2016-05-21 DIAGNOSIS — Z0289 Encounter for other administrative examinations: Secondary | ICD-10-CM

## 2016-05-21 LAB — GLUCOSE, CAPILLARY
GLUCOSE-CAPILLARY: 182 mg/dL — AB (ref 65–99)
GLUCOSE-CAPILLARY: 106 mg/dL — AB (ref 65–99)
GLUCOSE-CAPILLARY: 152 mg/dL — AB (ref 65–99)
GLUCOSE-CAPILLARY: 229 mg/dL — AB (ref 65–99)

## 2016-05-21 LAB — CULTURE, RESPIRATORY W GRAM STAIN

## 2016-05-21 LAB — CULTURE, RESPIRATORY: CULTURE: NORMAL

## 2016-05-21 NOTE — Progress Notes (Signed)
PROGRESS NOTE    STACIE KNUTZEN  EYC:[id number] DOB: 01-Oct-1956 DOA: 05/18/2016 PCP: [name], MD    Brief Narrative:   60 y.o. 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  - Improving on IV Solu-Medrol.   Lung mass - Has been progressive despite chemotherapy. After resolution of infection we'll plan on having patient follow-up with oncology for further evaluation and recommendations.    HCAP (healthcare-associated pneumonia) - Continue current antibiotic regimen.  Depression with anxiety - Stable on Prozac    Essential hypertension - Continue Imdur, metoprolol    GERD - Stable on Protonix    Chronic pain syndrome - Continue supportive therapy and current regimen    Diabetes mellitus without complication (HCC) - Continue sliding scale insulin - We'll continue to monitor blood sugars    Coronary artery disease involving native coronary artery of native heart without angina pectoris - Stable continue current regimen patient is on Plavix and aspirin as well as Lipitor.  DVT prophylaxis: Lovenox Code Status: Full Family Communication: Discussed directly with patient no family at bedside Disposition Plan: Pending improvement in respiratory condition condition   Consultants:   None   Procedures: None   Antimicrobials: Vancomycin and cefepime   Subjective: Patient reports feeling better today  Objective: Filed Vitals:   05/21/16 0935 05/21/16 1123 05/21/16 1341 05/21/16 1601  BP: 158/79  151/63   Pulse: 59  59   Temp:   97.3 F (36.3 C)   TempSrc:    Oral   Resp:   18   Height:      Weight:      SpO2: 100% 100% 100% 98%    Intake/Output Summary (Last 24 hours) at 05/21/16 1621 Last data filed at 05/21/16 1128  Gross per 24 hour  Intake 958.33 ml  Output   1725 ml  Net -766.67 ml   Filed Weights   05/18/16 1440  Weight: 85.2 kg (187 lb 13.3 oz)    Examination:  General exam: Appears calm and comfortable , In no acute distress Respiratory system: Increased work of breathing, nasal cannula in place, diffuse bilateral expiratory wheezes, equal chest rise Cardiovascular system: S1 & S2 heard, RRR. No  murmurs, rubs, gallops or clicks. Gastrointestinal system: Abdomen is nondistended, soft and nontender. No organomegaly or masses felt. Normal bowel sounds heard. Obese Central nervous system: Alert and oriented. No focal neurological deficits. Extremities: Symmetric 5 x 5 power. Skin: No rashes, lesions or ulcers on limited exam Psychiatry: Judgement and insight appear normal. Mood & affect appropriate.   Data Reviewed: I have personally reviewed following labs and imaging studies  CBC:  Recent Labs Lab 05/18/16 1145 05/19/16 0542  WBC 7.9 8.1  NEUTROABS 7.1  --   HGB 11.9* 11.6*  HCT 37.0* 37.4*  MCV 91.6 94.9  PLT 146* 149   Basic Metabolic Panel:  Recent Labs Lab 05/18/16 1145 05/19/16 0542  NA 138 142  K 3.3* 3.7  CL 99* 102  CO2 31 34*  GLUCOSE 138* 133*  BUN 8 9  CREATININE 0.81 0.77  CALCIUM 8.9 9.3   GFR: Estimated Creatinine Clearance: 93.5 mL/min (by C-G formula  based on Cr of 0.77). Liver Function Tests:  Recent Labs Lab 05/18/16 1145 05/19/16 0542  AST 18 20  ALT 16* 17  ALKPHOS 72 67  BILITOT 0.7 0.7  PROT 6.5 6.4*  ALBUMIN 3.7 3.4*   No results for input(s): LIPASE, AMYLASE in the last 168 hours. No results for input(s): AMMONIA in the last 168 hours. Coagulation Profile: No results for input(s): INR, PROTIME in the last 168 hours. Cardiac Enzymes:  Recent Labs Lab  05/18/16 1145  TROPONINI <0.03   BNP (last 3 results) No results for input(s): PROBNP in the last 8760 hours. HbA1C: No results for input(s): HGBA1C in the last 72 hours. CBG:  Recent Labs Lab 05/20/16 1208 05/20/16 1654 05/20/16 2104 05/21/16 0748 05/21/16 1110  GLUCAP 122* 181* 225* 182* 106*   Lipid Profile: No results for input(s): CHOL, HDL, LDLCALC, TRIG, CHOLHDL, LDLDIRECT in the last 72 hours. Thyroid Function Tests: No results for input(s): TSH, T4TOTAL, FREET4, T3FREE, THYROIDAB in the last 72 hours. Anemia Panel: No results for input(s): VITAMINB12, FOLATE, FERRITIN, TIBC, IRON, RETICCTPCT in the last 72 hours. Sepsis Labs: No results for input(s): PROCALCITON, LATICACIDVEN in the last 168 hours.  Recent Results (from the past 240 hour(s))  Culture, blood (routine x 2)     Status: None (Preliminary result)   Collection Time: 05/18/16  2:20 PM  Result Value Ref Range Status   Specimen Description BLOOD LEFT HAND  Final   Special Requests BOTTLES DRAWN AEROBIC AND ANAEROBIC 5 CC EACH  Final   Culture   Final    NO GROWTH 3 DAYS Performed at [facility]    Report Status PENDING  Incomplete  Culture, blood (routine x 2)     Status: None (Preliminary result)   Collection Time: 05/18/16  2:20 PM  Result Value Ref Range Status   Specimen Description BLOOD RIGHT ANTECUBITAL  Final   Special Requests BOTTLES DRAWN AEROBIC AND ANAEROBIC 5 CC EACH  Final   Culture   Final    NO GROWTH 3 DAYS Performed at [facility]    Report Status PENDING  Incomplete  Culture, sputum-assessment     Status: None   Collection Time: 05/18/16  5:40 PM  Result Value Ref Range Status   Specimen Description Expect. Sput  Final   Special Requests NONE  Final   Sputum evaluation   Final    THIS SPECIMEN IS ACCEPTABLE. RESPIRATORY CULTURE REPORT TO FOLLOW.   Report Status 05/18/2016 FINAL  Final  Culture, respiratory (NON-Expectorated)     Status: None   Collection  Time: 05/18/16  5:40 PM  Result Value Ref Range Status   Specimen Description SPUTUM  Final   Special Requests NONE  Final   Gram Stain   Final    MODERATE WBC PRESENT, PREDOMINANTLY PMN FEW SQUAMOUS EPITHELIAL CELLS PRESENT MODERATE GRAM POSITIVE COCCI IN PAIRS AND CHAINS RARE BUDDING YEAST SEEN    Culture   Final    Consistent with normal respiratory flora. Performed at [facility]     Report Status 05/21/2016 FINAL  Final         Radiology Studies: No results found.      Scheduled Meds: . ARIPiprazole  5 mg Oral QHS  . aspirin EC  81 mg Oral Daily  . atorvastatin  20 mg Oral Daily  . ceFEPime (MAXIPIME) IV  1 g Intravenous Q8H  . clopidogrel  75 mg Oral Daily  . dextromethorphan-guaiFENesin  1 tablet Oral BID  .  enoxaparin (LOVENOX) injection  40 mg Subcutaneous Q24H  . feeding supplement (ENSURE ENLIVE)  237 mL Oral BID BM  . FLORA-Q  1 capsule Oral Daily  . FLUoxetine  40 mg Oral q morning - 10a  . gabapentin  100 mg Oral Daily  . insulin aspart  0-5 Units Subcutaneous QHS  . insulin aspart  0-9 Units Subcutaneous TID WC  . ipratropium  0.5 mg Nebulization Once  . ipratropium-albuterol  3 mL Nebulization Q4H WA  . isosorbide mononitrate  120 mg Oral Daily  . methylPREDNISolone (SOLU-MEDROL) injection  40 mg Intravenous Q12H  . metoprolol succinate  12.5 mg Oral Daily  . mirtazapine  45 mg Oral QHS  . modafinil  200 mg Oral Daily  . morphine  30 mg Oral Q12H  . pantoprazole  80 mg Oral Daily  . potassium chloride SA  20 mEq Oral q morning - 10a  . tamsulosin  0.4 mg Oral Daily  . theophylline  400 mg Oral Daily  . vancomycin  1,000 mg Intravenous Q8H   Continuous Infusions: . sodium chloride 50 mL/hr at 05/21/16 1413    Time spent: > 35  Velvet Bathe, MD [facility] Pager [phone]  If 7PM-7AM, please contact night-coverage [email or web address] Password [facility] 05/21/2016, 4:21 PM

## 2016-05-21 NOTE — Progress Notes (Signed)
[facility]  Patient Status: Active (receiving services up to time of hospitalization)  [facility] is providing the following services: RN  If patient discharges after hours, please call [phone].   Ernest [name] 05/21/2016, 10:37 AM

## 2016-05-21 NOTE — Care Management Note (Signed)
Case Management Note  Patient Details  Name: TODDRICK SANNA MRN: [id number] Date of Birth: [birth year]  Subjective/Objective:      60 yo admitted with COPD exacerbation.              Action/Plan: Pt from home with wife. Pt wears 2L 02 continuously at home.  If liter flow is to increase, will need a new qualifying saturation screen and new MD order for 02. Pt also uses [facility] for [facility]. Will need MD order for resumption of HHRN at discharge. CM will continue and assist with DC needs.  Expected Discharge Date:  05/22/16               Expected Discharge Plan:  [place]  In-House Referral:     Discharge planning Services  CM Consult  Post Acute Care Choice:    Choice offered to:     DME Arranged:    DME Agency:     HH Arranged:  Disease Management [place] Agency:  [place]  Status of Service:  In process, will continue to follow  If discussed at Long Length of Stay Meetings, dates discussed:    Additional CommentsLynnell Catalan, RN 05/21/2016, 12:50 PM  [phone]

## 2016-05-22 DIAGNOSIS — J189 Pneumonia, unspecified organism: Secondary | ICD-10-CM

## 2016-05-22 DIAGNOSIS — C3491 Malignant neoplasm of unspecified part of right bronchus or lung: Secondary | ICD-10-CM

## 2016-05-22 DIAGNOSIS — J441 Chronic obstructive pulmonary disease with (acute) exacerbation: Secondary | ICD-10-CM

## 2016-05-22 LAB — BASIC METABOLIC PANEL
ANION GAP: 8 (ref 5–15)
BUN: 16 mg/dL (ref 6–20)
CALCIUM: 9.2 mg/dL (ref 8.9–10.3)
CO2: 31 mmol/L (ref 22–32)
Chloride: 99 mmol/L — ABNORMAL LOW (ref 101–111)
Creatinine, Ser: 0.79 mg/dL (ref 0.61–1.24)
GFR calc Af Amer: >60 mL/min (ref >60)
Glucose, Bld: 216 mg/dL — ABNORMAL HIGH (ref 65–99)
POTASSIUM: 4 mmol/L (ref 3.5–5.1)
SODIUM: 138 mmol/L (ref 135–145)

## 2016-05-22 LAB — GLUCOSE, CAPILLARY
GLUCOSE-CAPILLARY: 216 mg/dL — AB (ref 65–99)
GLUCOSE-CAPILLARY: 186 mg/dL — AB (ref 65–99)
GLUCOSE-CAPILLARY: 313 mg/dL — AB (ref 65–99)
Glucose-Capillary: 130 mg/dL — ABNORMAL HIGH (ref 65–99)

## 2016-05-22 MED ORDER — PREDNISONE 20 MG PO TABS
40.0000 mg | ORAL_TABLET | Freq: Every day | ORAL | Status: DC
  Administered 2016-05-23: 40 mg via ORAL
  Filled 2016-05-22: qty 2

## 2016-05-22 MED ORDER — IPRATROPIUM-ALBUTEROL 0.5-2.5 (3) MG/3ML IN SOLN: 3.0000 mL | Freq: Four times a day (QID) | RESPIRATORY_TRACT | Status: DC

## 2016-05-22 MED ORDER — LEVOFLOXACIN 750 MG PO TABS
750.0000 mg | ORAL_TABLET | Freq: Every day | ORAL | Status: DC
  Administered 2016-05-22: 750 mg via ORAL
  Filled 2016-05-22: qty 1

## 2016-05-22 NOTE — Consult Note (Signed)
   [facility] CM Inpatient Consult   05/22/2016  Matei Magnone [place]ock 04-Jun-1956 [id number]   Patient is currently active with [place] [facility] program for long-term disease management services. Patient will receive post hospital discharge transition of care calls and will be assessed for monthly home visits for assessments and for education. Will make inpatient RNCM aware patient is active with [place] [facility]. Please see chart review tab then notes for further patient outreach details by [place].   Marthenia Rolling, MSN-Ed, RN,BSN [facility] Liaison [phone]

## 2016-05-22 NOTE — Progress Notes (Signed)
PROGRESS NOTE                                                                                                                                                                                                             Patient Demographics:    Ernest [name], is a 60 y.o. male, DOB - 12-07-1955, BEM:[id number]  Admit date - 05/18/2016   Admitting Physician Waldemar Dickens, MD  Outpatient Primary MD for the patient is [name], MD  LOS - 3  Outpatient Specialists: Pulmonary ( Dr Annamaria Boots) Oncology (Dr. Earlie Server)   Chief Complaint  Patient presents with  . 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    Subjective:   Patient reported breathing to be much better, about 60% of his baseline.   Assessment  & Plan :   Principal problem Acute on chronic hypoxic respiratory failure ([place]) Secondary to COPD exacerbation, HCAP, and progression of underlying lung cancer. Slowly improving. Ambulating on 2 L via nasal cannula without O2 desaturation. Switch to oral prednisone. Switch antibiotic to oral Levaquin. Scheduled DuoNeb's.    Active Problems:   Depression with anxiety  Stable. Resume home medications    Essential hypertension  Stable. Continue home medications     Non-small cell carcinoma of lung, right [facility])   Following with [name]. Recent progression of disease. On Nivolumab    Chronic pain syndrome   Stable. Continue home medication    Diabetes mellitus without complication (HCC) Stable on sliding scale coverage. A1c of 8.1.    Coronary artery disease involving native coronary artery of native heart without angina pectoris   Stable. Continue aspirin, Plavix, beta blocker and statin.    HCAP (healthcare-associated pneumonia)   Improving. Cultures negative. Switch antibiotics to Levaquin.     COPD exacerbation ([place])   As outlined above.   GERD Continue PPI     Code Status :  Full code  Family Communication  : None at bedside  Disposition Plan  : Home in a.m. if respiratory function improved.  Barriers For Discharge : Active wheezing.  Consults  :  None  Procedures  : None  DVT Prophylaxis  :  Lovenox -   Lab Results  Component Value Date   PLT 160 05/19/2016    Antibiotics  :    Anti-infectives    Start     Dose/Rate Route Frequency Ordered Stop   05/18/16 2200  ceFEPIme (MAXIPIME) 1 g in dextrose 5 % 50 mL IVPB     1 g 100 mL/hr over 30 Minutes Intravenous Every 8 hours 05/18/16 1400 05/26/16 2159   05/18/16 2000  vancomycin (VANCOCIN) IVPB 1000 mg/200 mL premix     1,000 mg 200 mL/hr over 60 Minutes Intravenous Every 8 hours 05/18/16 1454     05/18/16 1500  vancomycin (VANCOCIN) IVPB 1000 mg/200 mL premix     1,000 mg 200 mL/hr over 60 Minutes Intravenous  Once 05/18/16 1410 05/18/16 1638   05/18/16 1415  ceFEPIme (MAXIPIME) 2 g in dextrose 5 % 50 mL IVPB  Status:  Discontinued     2 g 100 mL/hr over 30 Minutes Intravenous NOW 05/18/16 1408 05/19/16 1400        Objective:   Filed Vitals:   05/22/16 0440 05/22/16 0745 05/22/16 1200 05/22/16 1500  BP: 156/74   130/61  Pulse: 56   58  Temp: 97.9 F (36.6 C)   97.9 F (36.6 C)  TempSrc: Oral   Oral  Resp: 20   18  Height:      Weight:      SpO2: 98% 98% 94% 98%    Wt Readings from Last 3 Encounters:  05/18/16 85.2 kg (187 lb 13.3 oz)  05/13/16 86.183 kg (190 lb)    05/07/16 87.816 kg (193 lb 9.6 oz)     Intake/Output Summary (Last 24 hours) at 05/22/16 1546 Last data filed at 05/22/16 1437  Gross per 24 hour  Intake   3890 ml  Output   1950 ml  Net   1940 ml     Physical Exam  Gen: not in distress HEENT: no pallor, moist mucosa, supple neck Chest: Scattered wheezing bilaterally, no crackles CVS: N S1&S2, no murmurs, rubs or gallop GI: soft, NT, ND, BS+ Musculoskeletal: warm, no edema CNS: Alert and oriented, nonfocal    Data Review:    CBC  Recent Labs Lab 05/18/16 1145 05/19/16 0542  WBC 7.9 8.1  HGB 11.9* 11.6*  HCT 37.0* 37.4*  PLT 146* 160  MCV 91.6 94.9  MCH 29.5 29.4  MCHC 32.2 31.0  RDW 18.4* 18.7*  LYMPHSABS 0.4*  --   MONOABS 0.3  --   EOSABS 0.2  --   BASOSABS 0.0  --     Chemistries   Recent Labs Lab 05/18/16 1145 05/19/16 0542 05/22/16 0519  NA 138 142 138  K 3.3* 3.7 4.0  CL 99* 102 99*  CO2 31 34* 31  GLUCOSE 138* 133* 216*  BUN '8 9 16  '$ CREATININE 0.81 0.77 0.79  CALCIUM 8.9 9.3 9.2  AST 18 20  --   ALT 16* 17  --   ALKPHOS 72 67  --   BILITOT 0.7 0.7  --    ------------------------------------------------------------------------------------------------------------------ No results for input(s): CHOL, HDL, LDLCALC, TRIG, CHOLHDL, LDLDIRECT in the last 72 hours.  Lab Results  Component Value Date   HGBA1C 8.1* 04/22/2016   ------------------------------------------------------------------------------------------------------------------  No results for input(s): TSH, T4TOTAL, T3FREE, THYROIDAB in the last 72 hours.  Invalid input(s): FREET3 ------------------------------------------------------------------------------------------------------------------ No results for input(s): VITAMINB12, FOLATE, FERRITIN, TIBC, IRON, RETICCTPCT in the last 72 hours.  Coagulation profile No results for input(s): INR, PROTIME in the last 168 hours.  No results for input(s): DDIMER in the last 72  hours.  Cardiac Enzymes  Recent Labs Lab 05/18/16 1145  TROPONINI <0.03   ------------------------------------------------------------------------------------------------------------------    Component Value Date/Time   BNP 41.0 01/18/2016 0236   BNP 35.5 08/09/2014 0927    Inpatient Medications  Scheduled Meds: . ARIPiprazole  5 mg Oral QHS  . aspirin EC  81 mg Oral Daily  . atorvastatin  20 mg Oral Daily  . ceFEPime (MAXIPIME) IV  1 g Intravenous Q8H  . clopidogrel  75 mg Oral Daily  . dextromethorphan-guaiFENesin  1 tablet Oral BID  . enoxaparin (LOVENOX) injection  40 mg Subcutaneous Q24H  . feeding supplement (ENSURE ENLIVE)  237 mL Oral BID BM  . FLORA-Q  1 capsule Oral Daily  . FLUoxetine  40 mg Oral q morning - 10a  . gabapentin  100 mg Oral Daily  . insulin aspart  0-5 Units Subcutaneous QHS  . insulin aspart  0-9 Units Subcutaneous TID WC  . ipratropium  0.5 mg Nebulization Once  . ipratropium-albuterol  3 mL Nebulization Q4H WA  . isosorbide mononitrate  120 mg Oral Daily  . methylPREDNISolone (SOLU-MEDROL) injection  40 mg Intravenous Q12H  . metoprolol succinate  12.5 mg Oral Daily  . mirtazapine  45 mg Oral QHS  . modafinil  200 mg Oral Daily  . morphine  30 mg Oral Q12H  . pantoprazole  80 mg Oral Daily  . potassium chloride SA  20 mEq Oral q morning - 10a  . tamsulosin  0.4 mg Oral Daily  . theophylline  400 mg Oral Daily  . vancomycin  1,000 mg Intravenous Q8H   Continuous Infusions: . sodium chloride 50 mL/hr at 05/21/16 1900   PRN Meds:.clonazePAM, ipratropium-albuterol, ipratropium-albuterol, morphine, ondansetron **OR** ondansetron (ZOFRAN) IV, prochlorperazine  Micro Results Recent Results (from the past 240 hour(s))  Culture, blood (routine x 2)     Status: None (Preliminary result)   Collection Time: 05/18/16  2:20 PM  Result Value Ref Range Status   Specimen Description BLOOD LEFT HAND  Final   Special Requests BOTTLES DRAWN AEROBIC  AND ANAEROBIC 5 CC EACH  Final   Culture   Final    NO GROWTH 4 DAYS Performed at [facility]    Report Status PENDING  Incomplete  Culture, blood (routine x 2)     Status: None (Preliminary result)   Collection Time: 05/18/16  2:20 PM  Result Value Ref Range Status   Specimen Description BLOOD RIGHT ANTECUBITAL  Final   Special Requests BOTTLES DRAWN AEROBIC AND ANAEROBIC 5 CC EACH  Final   Culture   Final    NO GROWTH 4 DAYS Performed at [facility]    Report Status PENDING  Incomplete  Culture, sputum-assessment     Status: None   Collection Time: 05/18/16  5:40 PM  Result Value Ref Range Status   Specimen Description Expect. Sput  Final   Special Requests NONE  Final   Sputum evaluation   Final    THIS SPECIMEN IS ACCEPTABLE. RESPIRATORY CULTURE REPORT TO FOLLOW.   Report Status 05/18/2016 FINAL  Final  Culture, respiratory (NON-Expectorated)     Status: None   Collection  Time: 05/18/16  5:40 PM  Result Value Ref Range Status   Specimen Description SPUTUM  Final   Special Requests NONE  Final   Gram Stain   Final    MODERATE WBC PRESENT, PREDOMINANTLY PMN FEW SQUAMOUS EPITHELIAL CELLS PRESENT MODERATE GRAM POSITIVE COCCI IN PAIRS AND CHAINS RARE BUDDING YEAST SEEN    Culture   Final    Consistent with normal respiratory flora. Performed at [facility]    Report Status 05/21/2016 FINAL  Final    Radiology Reports Dg Chest 2 View  05/18/2016  CLINICAL DATA:  Shortness of breath.  Lung cancer. EXAM: CHEST  2 VIEW COMPARISON:  04/21/2016 chest radiograph.  04/22/2016 chest CT . FINDINGS: Surgical hardware from ACD[facility] overlies the lower cervical spine. Stable cardiomediastinal silhouette with normal heart size. No pneumothorax. No pleural effusion. Significant growth of 6.3 cm right lower lobe lung mass. Stable left apical pleural capping and distortion. No pulmonary edema. No acute consolidative airspace disease. IMPRESSION: Significant growth of 6.3  cm right lower lobe lung mass, suggesting either a lung metastasis or metachronous primary lung cancer. Stable post treatment changes in the left upper lung. Electronically Signed   By: Ilona Sorrel M.D.   On: 05/18/2016 11:54   Dg Chest Port 1 View  05/19/2016  CLINICAL DATA:  Healthcare associated pneumonia, hypertension, COPD, diabetes mellitus, former smoker, lung cancer EXAM: PORTABLE CHEST 1 VIEW COMPARISON:  Portable exam 0519 hours compared to 05/18/2016 and correlated with CT chest of 04/22/2016 FINDINGS: Patient's chin obscures the lung apices. Normal heart size, mediastinal contours, and pulmonary vascularity. LEFT apex scarring and volume loss with superior retraction of the LEFT hilum. Improved atelectasis at lower RIGHT lung. Underlying COPD. Again identified mass at inferior RIGHT hemithorax 6.6 x 5.1 cm. No acute infiltrate, pleural effusion, or pneumothorax. IMPRESSION: Persistent RIGHT lower lobe mass. Improved RIGHT basilar atelectasis. COPD changes with chronic scarring at LEFT apex. Electronically Signed   By: Lavonia Dana M.D.   On: 05/19/2016 08:47    Time Spent in minutes  25   Louellen Molder M.D on 05/22/2016 at 3:46 PM  Between 7am to 7pm - Pager - [phone]  After 7pm go to [email or web address] - password [facility]  [facility] -  Office  [phone]

## 2016-05-22 NOTE — Care Management Important Message (Signed)
Important Message  Patient Details  Name: MARLEE TRENTMAN MRN: [id number] Date of Birth: Mar 04, 1956   Medicare Important Message Given:  Yes    Camillo Flaming 05/22/2016, 12:01 [place] Message  Patient Details  Name: TADEUSZ STAHL MRN: [id number] Date of Birth: Sep 16, 1956   Medicare Important Message Given:  Yes    Camillo Flaming 05/22/2016, 12:00 PM

## 2016-05-23 DIAGNOSIS — G894 Chronic pain syndrome: Secondary | ICD-10-CM

## 2016-05-23 DIAGNOSIS — J9621 Acute and chronic respiratory failure with hypoxia: Secondary | ICD-10-CM | POA: Diagnosis present

## 2016-05-23 DIAGNOSIS — J441 Chronic obstructive pulmonary disease with (acute) exacerbation: Secondary | ICD-10-CM | POA: Insufficient documentation

## 2016-05-23 DIAGNOSIS — J9622 Acute and chronic respiratory failure with hypercapnia: Secondary | ICD-10-CM

## 2016-05-23 LAB — GLUCOSE, CAPILLARY
GLUCOSE-CAPILLARY: 181 mg/dL — AB (ref 65–99)
Glucose-Capillary: 170 mg/dL — ABNORMAL HIGH (ref 65–99)

## 2016-05-23 LAB — CULTURE, BLOOD (ROUTINE X 2)
CULTURE: NO GROWTH
CULTURE: NO GROWTH

## 2016-05-23 MED ORDER — PREDNISONE 20 MG PO TABS: 20.0000 mg | ORAL_TABLET | Freq: Every day | ORAL | Status: DC

## 2016-05-23 MED ORDER — PREDNISONE 5 MG PO TABS: 5.0000 mg | ORAL_TABLET | Freq: Every day | ORAL | Status: DC

## 2016-05-23 MED ORDER — ENSURE ENLIVE PO LIQD: 237.0000 mL | Freq: Two times a day (BID) | ORAL | Status: DC

## 2016-05-23 NOTE — Discharge Summary (Addendum)
Physician Discharge Summary  Ernest [name] Common MBW:[id number] DOB: 10/12/1956 DOA: 05/18/2016  PCP: [name], MD  Admit date: 05/18/2016 Discharge date: 05/23/2016  Admitted From: Home with home health Disposition: Home with home health RN  Recommendations for Outpatient Follow-up:  1. Follow up with PCP in 1-2 weeks 2. Follow up with oncology next week 3. Patient will be discharged on oral prednisone taper over 12 days and then resume chronic home dose of prednisone (5 mg daily)  Home Health: RN Equipment/Devices:none ( is on continuous 2L o2 via [place] )  Discharge Condition: Stable CODE STATUS: Full code Diet recommendation: Heart Healthy / Carb Modified     Discharge Diagnoses:  Principal Problem:   Acute on chronic respiratory failure with hypoxia and hypercapnia (HCC)   Active Problems:   COPD exacerbation ([place])   HCAP (healthcare-associated pneumonia)   Depression with anxiety   Essential hypertension   GERD   Non-small cell carcinoma of lung, right (HCC)   Chronic pain syndrome   Diabetes mellitus without complication ([place])   Coronary artery disease involving native coronary artery of native heart without angina pectoris   Brief narrative 60 year old male with a history of coronary artery disease, COPD on 2-3 L O2 via nasal cannula continuously, 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  Principal problem Acute on chronic hypoxic respiratory failure ([place]) Secondary to COPD exacerbation, HCAP, and progression of underlying lung cancer. Much improved today. (Feels >80% of his baseline)..  Ambulating on 2 L via nasal cannula without O2 desaturation.  Completed 6 days of antibiotics. Will switch to oral prednisone and discharge on taper over the next 12 days. He will then continue his chronic low-dose prednisone (5 mg daily). Continue inhaler and nebulizers at home, antitussives and continuous home O2.   Active Problems:  Depression with anxiety Stable. Resume home medications   Essential hypertension Stable. Continue home medications    Non-small cell carcinoma of lung, right [facility])  Following with [name]. Recent progression of disease. On Nivolumab   Chronic pain syndrome  Stable. Continue home medication   Diabetes mellitus without complication (HCC) Resume glipizide. A1c of 8.1.   Coronary artery disease involving native coronary artery of native heart without angina pectoris  Stable. Continue aspirin, Plavix, beta blocker and statin.   HCAP (healthcare-associated pneumonia) Symptoms improving. Completed 6 days of antibiotics.    COPD exacerbation ([place])  As outlined above.   GERD Continue PPI     Code Status : Full code  Family Communication : None at bedside  Disposition Plan : Home with home health RN    Consults : None  Procedures : None   Discharge Instructions     Medication List    STOP taking these medications        azithromycin 250 MG tablet  Commonly known as:  ZITHROMAX Z-PAK     pantoprazole 40 MG tablet  Commonly known as:  PROTONIX      TAKE these medications        ADVAIR DISKUS 500-50 MCG/DOSE Aepb  Generic drug:  Fluticasone-Salmeterol  1 PUFF THEN RINSE MOUTH, TWICE DAILY MAINTENANCE     albuterol 108 (90 Base) MCG/ACT inhaler  Commonly known as:  VENTOLIN HFA  INHALE 2 PUFFS INTO THE LUNGS 4 TIMES DAILY AS NEEDED FOR WHEEZING     albuterol (2.5 MG/3ML) 0.083% nebulizer solution  Commonly known as:  PROVENTIL  INHALE 1 VIAL IN NEBULIZER EVERY 4 HOURS AS NEEDED FOR  WHEEZING OR SHORTNESS OF BREATH     ARIPiprazole 5 MG tablet  Commonly known as:  ABILIFY  Take 1 tablet (5 mg total) by mouth at bedtime.     Armodafinil 250 MG tablet  Take 250 mg by mouth every morning.     aspirin 81 MG EC tablet  TAKE 1 TABLET BY MOUTH EVERY DAY     atorvastatin 20 MG tablet  Commonly known as:  LIPITOR  TAKE 1 TABLET EVERY DAY     BELSOMRA 20 MG Tabs  Generic drug:  Suvorexant  Take 20 mg by mouth at bedtime as needed (for sleep). Reported on 03/29/2016     clonazePAM 1 MG tablet  Commonly known as:  KLONOPIN  Take 2 mg by mouth at bedtime as needed for anxiety. Reported on 01/16/2016     clopidogrel 75 MG tablet  Commonly known as:  PLAVIX  Take 1 tablet (75 mg total) by mouth daily.     dextromethorphan-guaiFENesin 30-600 MG 12hr tablet  Commonly known as:  MUCINEX DM  Take 1 tablet by mouth 2 (two) times daily as needed for cough (congestion).     esomeprazole 40 MG capsule  Commonly known as:  NEXIUM  TAKE ONE CAPSULE BY MOUTH EVERY DAY     feeding supplement (ENSURE ENLIVE) Liqd  Take 237 mLs by mouth 2 (two) times daily between meals.     FLORA-Q Caps capsule  Take 1 capsule by mouth daily.     FLUoxetine 40 MG capsule  Commonly known as:  PROZAC  Take 40 mg by mouth every morning.     gabapentin 100 MG capsule  Commonly known as:  NEURONTIN  Take 100 mg by mouth daily.     glipiZIDE 5 MG tablet  Commonly known as:  GLUCOTROL  Take 0.5 tablets (2.5 mg total) by mouth daily before breakfast.     isosorbide mononitrate 120 MG 24 hr tablet  Commonly known as:  IMDUR  Take 120 mg by mouth daily.     metoprolol succinate 25 MG 24 hr tablet  Commonly known as:  TOPROL-XL  TAKE 1/2 TABLET BY MOUTH DAILY     mirtazapine 45 MG tablet  Commonly known as:  REMERON  Take 1 tablet (45 mg total) by mouth at bedtime.     morphine 30 MG 12 hr tablet  Commonly known as:  MS CONTIN  Take 1 tablet (30 mg total) by mouth every 12 (twelve)  hours.     morphine 15 MG tablet  Commonly known as:  MSIR  Take 1 tablet (15 mg total) by mouth every 4 (four) hours as needed for severe pain.     nitroGLYCERIN 0.4 MG SL tablet  Commonly known as:  NITROSTAT  Place 1 tablet (0.4 mg total) under the tongue every 5 (five) minutes as needed for chest pain.     ondansetron 8 MG tablet  Commonly known as:  ZOFRAN  TAKE 1 TABLET (8 MG TOTAL) BY MOUTH EVERY 8 (EIGHT) HOURS AS NEEDED FOR NAUSEA OR VOMITING.     OXYGEN  Inhale 2 L into the lungs at bedtime as needed.     potassium chloride SA 20 MEQ tablet  Commonly known as:  K-DUR,KLOR-CON  Take  1 tablet (20 mEq total) by mouth every morning. Reported on 12/07/2015     predniSONE 20 MG tablet  Commonly known as:  DELTASONE  Take 1 tablet (20 mg total) by mouth daily with breakfast.  Start taking on:  05/24/2016     predniSONE 5 MG tablet  Commonly known as:  DELTASONE  Take 1 tablet (5 mg total) by mouth daily. Please resume after she steroid taper is finished over after 9 days  Start taking on:  06/05/2016     prochlorperazine 10 MG tablet  Commonly known as:  COMPAZINE  TAKE 1 TABLET EVERY 6 HOURS AS NEEDED FOR NAUSEA AND VOMITING     tamsulosin 0.4 MG Caps capsule  Commonly known as:  FLOMAX  Take 0.4 mg by mouth daily.     theophylline 400 MG 24 hr tablet  Commonly known as:  UNIPHYL  TAKE 1 TABLET EVERY DAY     Tiotropium Bromide Monohydrate 1.25 MCG/ACT Aers  Commonly known as:  SPIRIVA RESPIMAT  Inhale 2 puffs into the lungs daily.           Follow-up Information    Follow up with [name], MD. Schedule an appointment as soon as possible for a visit in 1 week.   Specialty:  Internal Medicine   Contact information:   [address] [place] [address] [phone]      Allergies  Allergen Reactions  . Carboplatin Shortness Of Breath, Swelling and Rash    Swelling of lips, rash on face,eyes and head        Procedures/Studies: Dg  Chest 2 View  05/18/2016  CLINICAL DATA:  Shortness of breath.  Lung cancer. EXAM: CHEST  2 VIEW COMPARISON:  04/21/2016 chest radiograph.  04/22/2016 chest CT . FINDINGS: Surgical hardware from ACDF overlies the lower cervical spine. Stable cardiomediastinal silhouette with normal heart size. No pneumothorax. No pleural effusion. Significant growth of 6.3 cm right lower lobe lung mass. Stable left apical pleural capping and distortion. No pulmonary edema. No acute consolidative airspace disease. IMPRESSION: Significant growth of 6.3 cm right lower lobe lung mass, suggesting either a lung metastasis or metachronous primary lung cancer. Stable post treatment changes in the left upper lung. Electronically Signed   By: Ilona Sorrel M.D.   On: 05/18/2016 11:54   Dg Chest Port 1 View  05/19/2016  CLINICAL DATA:  Healthcare associated pneumonia, hypertension, COPD, diabetes mellitus, former smoker, lung cancer EXAM: PORTABLE CHEST 1 VIEW COMPARISON:  Portable exam 0519 hours compared to 05/18/2016 and correlated with CT chest of 04/22/2016 FINDINGS: Patient's chin obscures the lung apices. Normal heart size, mediastinal contours, and pulmonary vascularity. LEFT apex scarring and volume loss with superior retraction of the LEFT hilum. Improved atelectasis at lower RIGHT lung. Underlying COPD. Again identified mass at inferior RIGHT hemithorax 6.6 x 5.1 cm. No acute infiltrate, pleural effusion, or pneumothorax. IMPRESSION: Persistent RIGHT lower lobe mass. Improved RIGHT basilar atelectasis. COPD changes with chronic scarring at LEFT apex. Electronically Signed   By: Lavonia Dana M.D.   On: 05/19/2016 08:47       Subjective: Feels much better today. No wheezing   Discharge Exam: Filed Vitals:   05/22/16 2054 05/23/16 0501  BP: 142/63 136/68  Pulse: 76 76  Temp: 97.7 F (36.5 C) 97.9 F (36.6 C)  Resp: 22 20   Filed Vitals:   05/22/16 1921 05/22/16 2054 05/23/16 0501 05/23/16 0810  BP:  142/63  136/68   Pulse:  76 76   Temp:  97.7 F (36.5 C) 97.9 F (36.6 C)   TempSrc:  Oral Oral   Resp:  22 20   Height:      Weight:      SpO2: 95% 96% 100% 98%     Gen: not in distress HEENT: no pallor, moist mucosa, supple neck Chest: Clear bilaterally, no wheezing or crackles CVS: N S1&S2, no murmurs, rubs or gallop GI: soft, NT, ND, BS+ Musculoskeletal: warm, no edema CNS: Alert and oriented, nonfocal   The results of significant diagnostics from this hospitalization (including imaging, microbiology, ancillary and laboratory) are listed below for reference.     Microbiology: Recent Results (from the past 240 hour(s))  Culture, blood (routine x 2)     Status: None   Collection Time: 05/18/16  2:20 PM  Result Value Ref Range Status   Specimen Description BLOOD LEFT HAND  Final   Special Requests BOTTLES DRAWN AEROBIC AND ANAEROBIC 5 CC EACH  Final   Culture   Final    NO GROWTH 5 DAYS Performed at [facility]    Report Status 05/23/2016 FINAL  Final  Culture, blood (routine x 2)     Status: None   Collection Time: 05/18/16  2:20 PM  Result Value Ref Range Status   Specimen Description BLOOD RIGHT ANTECUBITAL  Final   Special Requests BOTTLES DRAWN AEROBIC AND ANAEROBIC 5 CC EACH  Final   Culture   Final    NO GROWTH 5 DAYS Performed at [facility]    Report Status 05/23/2016 FINAL  Final  Culture, sputum-assessment     Status: None   Collection Time: 05/18/16  5:40 PM  Result Value Ref Range Status   Specimen Description Expect. Sput  Final   Special Requests NONE  Final   Sputum evaluation   Final    THIS SPECIMEN IS ACCEPTABLE. RESPIRATORY CULTURE REPORT TO FOLLOW.   Report Status 05/18/2016 FINAL  Final  Culture, respiratory (NON-Expectorated)     Status: None   Collection Time: 05/18/16  5:40 PM  Result Value Ref Range Status   Specimen Description SPUTUM  Final   Special Requests NONE  Final   Gram Stain   Final    MODERATE WBC PRESENT,  PREDOMINANTLY PMN FEW SQUAMOUS EPITHELIAL CELLS PRESENT MODERATE GRAM POSITIVE COCCI IN PAIRS AND CHAINS RARE BUDDING YEAST SEEN    Culture   Final    Consistent with normal respiratory flora. Performed at [facility]    Report Status 05/21/2016 FINAL  Final     Labs: BNP (last 3 results)  Recent Labs  11/24/15 0154 01/18/16 0236  BNP 211.9* 79.3   Basic Metabolic Panel:  Recent Labs Lab 05/18/16 1145 05/19/16 0542 05/22/16 0519  NA 138 142 138  K 3.3* 3.7 4.0  CL 99* 102 99*  CO2 31 34* 31  GLUCOSE 138* 133* 216*  BUN '8 9 16  '$ CREATININE 0.81 0.77 0.79  CALCIUM 8.9 9.3 9.2   Liver Function Tests:  Recent Labs Lab 05/18/16 1145 05/19/16 0542  AST 18 20  ALT 16* 17  ALKPHOS 72 67  BILITOT 0.7 0.7  PROT 6.5 6.4*  ALBUMIN 3.7 3.4*   No results for input(s): LIPASE, AMYLASE in the last 168 hours. No results for input(s): AMMONIA in the last 168 hours. CBC:  Recent Labs Lab 05/18/16 1145 05/19/16 0542  WBC 7.9 8.1  NEUTROABS 7.1  --   HGB 11.9* 11.6*  HCT  37.0* 37.4*  MCV 91.6 94.9  PLT 146* 160   Cardiac Enzymes:  Recent Labs Lab 05/18/16 1145  TROPONINI <0.03   BNP: Invalid input(s): POCBNP CBG:  Recent Labs Lab 05/22/16 1212 05/22/16 1747 05/22/16 2058 05/23/16 0752 05/23/16 1149  GLUCAP 130* 186* 313* 181* 170*   D-Dimer No results for input(s): DDIMER in the last 72 hours. Hgb A1c No results for input(s): HGBA1C in the last 72 hours. Lipid Profile No results for input(s): CHOL, HDL, LDLCALC, TRIG, CHOLHDL, LDLDIRECT in the last 72 hours. Thyroid function studies No results for input(s): TSH, T4TOTAL, T3FREE, THYROIDAB in the last 72 hours.  Invalid input(s): FREET3 Anemia work up No results for input(s): VITAMINB12, FOLATE, FERRITIN, TIBC, IRON, RETICCTPCT in the last 72 hours. Urinalysis    Component Value Date/Time   COLORURINE AMBER* 05/18/2016 1310   APPEARANCEUR CLEAR 05/18/2016 1310   LABSPEC 1.019  05/18/2016 1310   PHURINE 7.0 05/18/2016 1310   GLUCOSEU NEGATIVE 05/18/2016 1310   GLUCOSEU NEGATIVE 10/05/2015 0958   HGBUR NEGATIVE 05/18/2016 1310   BILIRUBINUR SMALL* 05/18/2016 1310   BILIRUBINUR n 08/14/2011 1449   KETONESUR 15* 05/18/2016 1310   PROTEINUR 30* 05/18/2016 1310   PROTEINUR n 08/14/2011 1449   UROBILINOGEN 0.2 10/05/2015 0958   UROBILINOGEN 0.2 08/14/2011 1449   NITRITE NEGATIVE 05/18/2016 1310   NITRITE n 08/14/2011 1449   LEUKOCYTESUR NEGATIVE 05/18/2016 1310   Sepsis Labs Invalid input(s): PROCALCITONIN,  WBC,  LACTICIDVEN Microbiology Recent Results (from the past 240 hour(s))  Culture, blood (routine x 2)     Status: None   Collection Time: 05/18/16  2:20 PM  Result Value Ref Range Status   Specimen Description BLOOD LEFT HAND  Final   Special Requests BOTTLES DRAWN AEROBIC AND ANAEROBIC 5 CC EACH  Final   Culture   Final    NO GROWTH 5 DAYS Performed at [facility]    Report Status 05/23/2016 FINAL  Final  Culture, blood (routine x 2)     Status: None   Collection Time: 05/18/16  2:20 PM  Result Value Ref Range Status   Specimen Description BLOOD RIGHT ANTECUBITAL  Final   Special Requests BOTTLES DRAWN AEROBIC AND ANAEROBIC 5 CC EACH  Final   Culture   Final    NO GROWTH 5 DAYS Performed at [facility]    Report Status 05/23/2016 FINAL  Final  Culture, sputum-assessment     Status: None   Collection Time: 05/18/16  5:40 PM  Result Value Ref Range Status   Specimen Description Expect. Sput  Final   Special Requests NONE  Final   Sputum evaluation   Final    THIS SPECIMEN IS ACCEPTABLE. RESPIRATORY CULTURE REPORT TO FOLLOW.   Report Status 05/18/2016 FINAL  Final  Culture, respiratory (NON-Expectorated)     Status: None   Collection Time: 05/18/16  5:40 PM  Result Value Ref Range Status   Specimen Description SPUTUM  Final   Special Requests NONE  Final   Gram Stain   Final    MODERATE WBC PRESENT, PREDOMINANTLY PMN FEW  SQUAMOUS EPITHELIAL CELLS PRESENT MODERATE GRAM POSITIVE COCCI IN PAIRS AND CHAINS RARE BUDDING YEAST SEEN    Culture   Final    Consistent with normal respiratory flora. Performed at [facility]    Report Status 05/21/2016 FINAL  Final     Time coordinating discharge: Over 30 minutes  SIGNED:   Louellen Molder, MD  [facility] 05/23/2016, 11:54 AM  Pager   If 7PM-7AM, please contact night-coverage [email or web address] Password [email or web address]

## 2016-05-23 NOTE — Progress Notes (Signed)
[id number]/YOVZCH Davis,BSN,RN3,CCM:  NOTE TO MD PATIENT WILL NEED ORDER FOR RESUMPTION OF HOME HEALTH CARE ON DISCHARGE.

## 2016-05-23 NOTE — Consult Note (Signed)
   [facility] CM Inpatient Consult   05/23/2016  Ernest [name] Apr 04, 1956 [id number]   Spoke with Ernest [name] at bedside. Discussed ongoing follow up from [place]. He also endorses he was active with [facility] prior to admission. He will need resumption of care orders as well for [facility] RN. Inpatient RNCM following for home health needs. Ernest [name] will receive post hospital discharge transition of care calls and will be assessed for monthly home visits. Support and encouragement offered. He expressed appreciation of visit. Made inpatient RNCM aware.  Marthenia Rolling, MSN-Ed, RN,BSN [facility] Liaison [phone]

## 2016-05-23 NOTE — Progress Notes (Signed)
Pt discharged from the unit via wheelchair. Discharge instructions were reviewed with the pt. No questions or concerns from the pt at this time.  Garielle Mroz W Zakaree Mcclenahan, RN

## 2016-05-23 NOTE — Discharge Instructions (Signed)
Chronic Obstructive Pulmonary Disease °Chronic obstructive pulmonary disease (COPD) is a common lung problem. In COPD, the flow of air from the lungs is limited. The way your lungs work will probably never return to normal, but there are things you can do to improve your lungs and make yourself feel better. Your doctor may treat your condition with: °· Medicines. °· Oxygen. °· Lung surgery. °· Changes to your diet. °· Rehabilitation. This may involve a team of specialists. °HOME CARE °· Take all medicines as told by your doctor. °· Avoid medicines or cough syrups that dry up your airway (such as antihistamines) and do not allow you to get rid of thick spit. You do not need to avoid them if told differently by your doctor. °· If you smoke, stop. Smoking makes the problem worse. °· Avoid being around things that make your breathing worse (like smoke, chemicals, and fumes). °· Use oxygen therapy and therapy to help improve your lungs (pulmonary rehabilitation) if told by your doctor. If you need home oxygen therapy, ask your doctor if you should buy a tool to measure your oxygen level (oximeter). °· Avoid people who have a sickness you can catch (contagious). °· Avoid going outside when it is very hot, cold, or humid. °· Eat healthy foods. Eat smaller meals more often. Rest before meals. °· Stay active, but remember to also rest. °· Make sure to get all the shots (vaccines) your doctor recommends. Ask your doctor if you need a pneumonia shot. °· Learn and use tips on how to relax. °· Learn and use tips on how to control your breathing as told by your doctor. Try: °¨ Breathing in (inhaling) through your nose for 1 second. Then, pucker your lips and breath out (exhale) through your lips for 2 seconds. °¨ Putting one hand on your belly (abdomen). Breathe in slowly through your nose for 1 second. Your hand on your belly should move out. Pucker your lips and breathe out slowly through your lips. Your hand on your belly  should move in as you breathe out. °· Learn and use controlled coughing to clear thick spit from your lungs. The steps are: °1. Lean your head a little forward. °2. Breathe in deeply. °3. Try to hold your breath for 3 seconds. °4. Keep your mouth slightly open while coughing 2 times. °5. Spit any thick spit out into a tissue. °6. Rest and do the steps again 1 or 2 times as needed. °GET HELP IF: °· You cough up more thick spit than usual. °· There is a change in the color or thickness of the spit. °· It is harder to breathe than usual. °· Your breathing is faster than usual. °GET HELP RIGHT AWAY IF: °· You have shortness of breath while resting. °· You have shortness of breath that stops you from: °¨ Being able to talk. °¨ Doing normal activities. °· You chest hurts for longer than 5 minutes. °· Your skin color is more blue than usual. °· Your pulse oximeter shows that you have low oxygen for longer than 5 minutes. °MAKE SURE YOU: °· Understand these instructions. °· Will watch your condition. °· Will get help right away if you are not doing well or get worse. °  °This information is not intended to replace advice given to you by your health care provider. Make sure you discuss any questions you have with your health care provider. °  °Document Released: 04/29/2008 Document Revised: 12/02/2014 Document Reviewed: 07/08/2013 °Elsevier Interactive Patient   Education ©2016 [employer] Inc. ° °

## 2016-05-24 ENCOUNTER — Other Ambulatory Visit: Payer: Self-pay | Admitting: *Deleted

## 2016-05-24 DIAGNOSIS — J449 Chronic obstructive pulmonary disease, unspecified: Secondary | ICD-10-CM | POA: Diagnosis not present

## 2016-05-24 DIAGNOSIS — C342 Malignant neoplasm of middle lobe, bronchus or lung: Secondary | ICD-10-CM | POA: Diagnosis not present

## 2016-05-24 DIAGNOSIS — J9601 Acute respiratory failure with hypoxia: Secondary | ICD-10-CM | POA: Diagnosis not present

## 2016-05-24 NOTE — Patient Outreach (Signed)
[name]) Care Management [facility] CM Telephone Outreach, Transition of Care day 1 05/24/2016  Ernest [name] 01/22/1956 [id number]  Successful telephone outreach to [employer]" [name], 60 y.o. male followed by [facility] for transition of care after recent inpatient hospital visit, as well as ongoing self management of chronic disease state of COPD. Ernest [name] has lung cancer and started a new round of chemotherapy in April 2017. Ernest [name] was recently admitted May 28-April 25, 2016 for increased shortness of breath/ dyspnea; it was determined that he had healthcare associated pneumonia. Unfortunately, Ernest [name] was re-admitted to the hospital June 24-29, 2017, again for COPD exacerbation and HCAP.  Ernest [name] is now followed by [facility] for transition of care after most recent hospital discharge on April 25, 2016.  Today, Ernest [name] states that he "is doing much better," after his most recent hospitalization.  He reports that he awoke on "Thursday or Friday night," and had a "mucous plug," and "waited too long to go to the ER for help."  Ernest [name] stated that he continued to do poorly after having the mucous plug, and went to the ED on Saturday May 18, 2016, at which time he was admitted.  Ernest [name] reports that during his hospital visit, his O2 was decreased from 3-4 L/min to 2 L/min, and states that he is doing well on 2 L/min O2, stating that he "checks his oxygen level every hour" with his home pulse oximetry.  Ernest [name] states that "they have been 96-97% since I got home" while on 2 L/min.  Ernest [name] denies respiratory distress at the present time.  Ernest [name] reported that he has heard back from home health [facility] RN, who is scheduled to come see him on Tuesday, May 28, 2016.  He reported that he has a scheduled office visit with his oncologist, [name], on Monday, May 27, 2016, stating that he will attend the appointment.  Ernest [name] reported that "they are planning to start a new  infusion" on Monday May 27, 2016, although he says he "isn't sure" if this is chemotherapy; Ernest [name] stated that he believes it is "some kind of testing."  Ernest [name] stated that he is not sure this infusion will be administered, as he was told that he needed to be "completely off of prednisone" for the treatment/ testing.  Ernest [name] states that he will call his PCP this afternoon to obtain a post-hospital discharge appointment.  Ernest [name] stated that he has all of his medications and is taking as prescribed, and he denies further concerns, questions, needs, or problems at this time.  [name] and I confirmed our previously scheduled [place] in-home visit for June 06, 2016.  I confirmed that Ernest [name] has the [facility] 24-hour nurse call line phone number, as well as my direct contact information, should he have questions or needs prior to next [place] outreach.  Plan: Ernest [name] will take his medications as prescribed and keep all scheduled provider appointments.  Ernest [name] will work with home health nursing and PT services as ordered post-hospital discharge.  Ernest [name] will call his providers for any new concerns, issues, questions or problems.  [facility] telephone outreach for continued transition of care scheduled for next week.   [name], RN, BSN, [employer] [facility] Care Management  [phone]

## 2016-05-27 ENCOUNTER — Ambulatory Visit (HOSPITAL_BASED_OUTPATIENT_CLINIC_OR_DEPARTMENT_OTHER): Payer: Commercial Managed Care - HMO | Admitting: Internal Medicine

## 2016-05-27 ENCOUNTER — Other Ambulatory Visit (HOSPITAL_BASED_OUTPATIENT_CLINIC_OR_DEPARTMENT_OTHER): Payer: Commercial Managed Care - HMO

## 2016-05-27 ENCOUNTER — Encounter: Payer: Self-pay | Admitting: Internal Medicine

## 2016-05-27 ENCOUNTER — Ambulatory Visit (HOSPITAL_BASED_OUTPATIENT_CLINIC_OR_DEPARTMENT_OTHER): Payer: Commercial Managed Care - HMO

## 2016-05-27 VITALS — BP 147/75 | HR 94 | Temp 99.0°F | Resp 19 | Ht 63.0 in | Wt 188.4 lb

## 2016-05-27 DIAGNOSIS — C3491 Malignant neoplasm of unspecified part of right bronchus or lung: Secondary | ICD-10-CM

## 2016-05-27 DIAGNOSIS — Z5112 Encounter for antineoplastic immunotherapy: Secondary | ICD-10-CM

## 2016-05-27 DIAGNOSIS — C7801 Secondary malignant neoplasm of right lung: Secondary | ICD-10-CM

## 2016-05-27 DIAGNOSIS — Z79899 Other long term (current) drug therapy: Secondary | ICD-10-CM

## 2016-05-27 LAB — COMPREHENSIVE METABOLIC PANEL
ALBUMIN: 3.2 g/dL — AB (ref 3.5–5.0)
ALK PHOS: 81 U/L (ref 40–150)
ALT: 19 U/L (ref 0–55)
ANION GAP: 10 meq/L (ref 3–11)
AST: 14 U/L (ref 5–34)
BUN: 12.3 mg/dL (ref 7.0–26.0)
CALCIUM: 9.7 mg/dL (ref 8.4–10.4)
CHLORIDE: 99 meq/L (ref 98–109)
CO2: 28 mEq/L (ref 22–29)
Creatinine: 0.9 mg/dL (ref 0.7–1.3)
EGFR: >90 mL/min/{1.73_m2} (ref >90)
Glucose: 146 mg/dl — ABNORMAL HIGH (ref 70–140)
POTASSIUM: 4.1 meq/L (ref 3.5–5.1)
Sodium: 137 mEq/L (ref 136–145)
Total Bilirubin: 0.86 mg/dL (ref 0.20–1.20)
Total Protein: 6.5 g/dL (ref 6.4–8.3)

## 2016-05-27 LAB — CBC WITH DIFFERENTIAL/PLATELET
BASO%: 0.1 % (ref 0.0–2.0)
BASOS ABS: 0 10*3/uL (ref 0.0–0.1)
EOS ABS: 0.3 10*3/uL (ref 0.0–0.5)
EOS%: 2.3 % (ref 0.0–7.0)
HEMATOCRIT: 41.8 % (ref 38.4–49.9)
HGB: 13.4 g/dL (ref 13.0–17.1)
LYMPH%: 8.2 % — AB (ref 14.0–49.0)
MCH: 29.6 pg (ref 27.2–33.4)
MCHC: 32.1 g/dL (ref 32.0–36.0)
MCV: 92.5 fL (ref 79.3–98.0)
MONO#: 1.1 10*3/uL — AB (ref 0.1–0.9)
MONO%: 8.6 % (ref 0.0–14.0)
NEUT#: 10.3 10*3/uL — ABNORMAL HIGH (ref 1.5–6.5)
NEUT%: 80.8 % — AB (ref 39.0–75.0)
PLATELETS: 160 10*3/uL (ref 140–400)
RBC: 4.52 10*6/uL (ref 4.20–5.82)
RDW: 17.5 % — ABNORMAL HIGH (ref 11.0–14.6)
WBC: 12.8 10*3/uL — ABNORMAL HIGH (ref 4.0–10.3)
lymph#: 1 10*3/uL (ref 0.9–3.3)
nRBC: 0 % (ref 0–0)

## 2016-05-27 LAB — TSH: TSH: 2.195 m[IU]/L (ref 0.320–4.118)

## 2016-05-27 MED ORDER — SODIUM CHLORIDE 0.9 % IV SOLN
240.0000 mg | Freq: Once | INTRAVENOUS | Status: AC
  Administered 2016-05-27: 240 mg via INTRAVENOUS
  Filled 2016-05-27: qty 20

## 2016-05-27 MED ORDER — SODIUM CHLORIDE 0.9 % IV SOLN
Freq: Once | INTRAVENOUS | Status: AC
  Administered 2016-05-27: 14:00:00 via INTRAVENOUS

## 2016-05-27 NOTE — Patient Instructions (Signed)
[facility] Discharge Instructions for Patients Receiving Chemotherapy  Today you received the following chemotherapy agents:  nivolumab  To help prevent nausea and vomiting after your treatment, we encourage you to take your nausea medication as prescribed.   If you develop nausea and vomiting that is not controlled by your nausea medication, call the clinic.   BELOW ARE SYMPTOMS THAT SHOULD BE REPORTED IMMEDIATELY:  *FEVER GREATER THAN 100.5 F  *CHILLS WITH OR WITHOUT FEVER  NAUSEA AND VOMITING THAT IS NOT CONTROLLED WITH YOUR NAUSEA MEDICATION  *UNUSUAL SHORTNESS OF BREATH  *UNUSUAL BRUISING OR BLEEDING  TENDERNESS IN MOUTH AND THROAT WITH OR WITHOUT PRESENCE OF ULCERS  *URINARY PROBLEMS  *BOWEL PROBLEMS  UNUSUAL RASH Items with * indicate a potential emergency and should be followed up as soon as possible.  Feel free to call the clinic you have any questions or concerns. The clinic phone number is [phone].  Please show the [facility] at check-in to the Emergency Department and triage nurse.  Nivolumab injection What is this medicine? NIVOLUMAB (nye VOL ue mab) is a monoclonal antibody. It is used to treat melanoma, lung cancer, kidney cancer, and Hodgkin lymphoma. This medicine may be used for other purposes; ask your health care provider or pharmacist if you have questions. What should I tell my health care provider before I take this medicine? They need to know if you have any of these conditions: -diabetes -immune system problems -kidney disease -liver disease -lung disease -organ transplant -stomach or intestine problems -thyroid disease -an unusual or allergic reaction to nivolumab, other medicines, foods, dyes, or preservatives -pregnant or trying to get pregnant -breast-feeding How should I use this medicine? This medicine is for infusion into a vein. It is given by a health care professional in a hospital or clinic setting. A  special MedGuide will be given to you before each treatment. Be sure to read this information carefully each time. Talk to your pediatrician regarding the use of this medicine in children. Special care may be needed. Overdosage: If you think you have taken too much of this medicine contact a poison control center or emergency room at once. NOTE: This medicine is only for you. Do not share this medicine with others. What if I miss a dose? It is important not to miss your dose. Call your doctor or health care professional if you are unable to keep an appointment. What may interact with this medicine? Interactions have not been studied. Give your health care provider a list of all the medicines, herbs, non-prescription drugs, or dietary supplements you use. Also tell them if you smoke, drink alcohol, or use illegal drugs. Some items may interact with your medicine. This list may not describe all possible interactions. Give your health care provider a list of all the medicines, herbs, non-prescription drugs, or dietary supplements you use. Also tell them if you smoke, drink alcohol, or use illegal drugs. Some items may interact with your medicine. What should I watch for while using this medicine? This drug may make you feel generally unwell. Continue your course of treatment even though you feel ill unless your doctor tells you to stop. You may need blood work done while you are taking this medicine. Do not become pregnant while taking this medicine or for 5 months after stopping it. Women should inform their doctor if they wish to become pregnant or think they might be pregnant. There is a potential for serious side effects to an  unborn child. Talk to your health care professional or pharmacist for more information. Do not breast-feed an infant while taking this medicine. What side effects may I notice from receiving this medicine? Side effects that you should report to your doctor or health care  professional as soon as possible: -allergic reactions like skin rash, itching or hives, swelling of the face, lips, or tongue -black, tarry stools -blood in the urine -bloody or watery diarrhea -changes in vision -change in sex drive -changes in emotions or moods -chest pain -confusion -cough -decreased appetite -diarrhea -facial flushing -feeling faint or lightheaded -fever, chills -hair loss -hallucination, loss of contact with reality -headache -irritable -joint pain -loss of memory -muscle pain -muscle weakness -seizures -shortness of breath -signs and symptoms of high blood sugar such as dizziness; dry mouth; dry skin; fruity breath; nausea; stomach pain; increased hunger or thirst; increased urination -signs and symptoms of kidney injury like trouble passing urine or change in the amount of urine -signs and symptoms of liver injury like dark yellow or brown urine; general ill feeling or flu-like symptoms; light-colored stools; loss of appetite; nausea; right upper belly pain; unusually weak or tired; yellowing of the eyes or skin -stiff neck -swelling of the ankles, feet, hands -weight gain Side effects that usually do not require medical attention (report to your doctor or health care professional if they continue or are bothersome): -bone pain -constipation -tiredness -vomiting This list may not describe all possible side effects. Call your doctor for medical advice about side effects. You may report side effects to FDA at 1-800-FDA-1088. Where should I keep my medicine? This drug is given in a hospital or clinic and will not be stored at home. NOTE: This sheet is a summary. It may not cover all possible information. If you have questions about this medicine, talk to your doctor, pharmacist, or health care provider.    2016, Elsevier/Gold Standard. (2015-04-12 10:03:42)

## 2016-05-27 NOTE — Progress Notes (Signed)
Ernest [name] [phone]   [phone]  OFFICE PROGRESS NOTE  [name], MD [address] [facility] [address] [place] [address]  DIAGNOSIS: Metastatic non-small cell lung cancer, poorly differentiated carcinoma initially diagnosed as Stage IIIA (T3, N2, M0) non-small cell lung cancer consistent with squamous cell carcinoma involving the left suprahilar mass with mediastinal lymphadenopathy diagnosed in November of 2014. The patient has recurrence in February 2017. PDL 1 expression 0%.  PRIOR THERAPY:  1) Concurrent chemoradiation with weekly carboplatin for AUC of 2 and paclitaxel 45 mg/M2, status post 7 cycles, last dose was given 12/20/2013. First dose on 11/01/2013. 2) Consolidation chemotherapy with carboplatin for AUC of 5 and paclitaxel 175 mg/M2 every 3 weeks with Neulasta support. First cycle on 02/08/2014. Status post 3 cycles and carboplatin was discontinued secondary to allergic reaction. 3) Abraxane 100 MG/M2 on days 1 and 8 every 3 weeks. Status post 3 cycles. Last dose was given 04/16/2016 discontinued secondary to disease progression.   CURRENT THERAPY: Nivolumab 240 MG IV every 2 weeks. First dose 05/27/2016.  CHEMOTHERAPY INTENT: Curative/control  CURRENT # OF CHEMOTHERAPY CYCLES: 1  CURRENT ANTIEMETICS: Zofran, dexamethasone and Compazine  CURRENT SMOKING STATUS: Former smoker  ORAL CHEMOTHERAPY AND CONSENT: None  CURRENT BISPHOSPHONATES USE: None  PAIN MANAGEMENT: 0/10  NARCOTICS INDUCED CONSTIPATION: None.  LIVING WILL AND CODE STATUS: Full code.   INTERVAL HISTORY: Ernest [name] 60 y.o. male returns to the clinic today for follow-up visit accompanied by his wife. The patient is feeling fine today with no specific complaints except for shortness of breath with exertion secondary to COPD. He was recently admitted to [facility] with shortness of breath and COPD exacerbation. He was supposed to start  treatment with Nivolumab 2 weeks ago but this was missed secondary to his hospitalization. He is feeling much better today and ready to resume his treatment. He denied having any significant chest pain or hemoptysis. He denied having any nausea or vomiting, no fever or chills.  MEDICAL HISTORY: Past Medical History  Diagnosis Date  . CAD (coronary artery disease)     Left Main 30% stenosis, LAD 20 - 30 % stenosis, first and second diagonal branchesat 40 - 50%  stenosis with small arteries, circumflex had 30% stenosis in the large obtuse marginal, RCA at 70 - 80%  stenosis [not felt to be occlusive after evaluation with flow wire], distal 50 - 60% stenosis - [name][  . COPD (chronic obstructive pulmonary disease) (HCC)     Dr. Baird Lyons  . Depression   . Anxiety   . Hyperlipidemia   . Chronic insomnia   . Gout   . GERD (gastroesophageal reflux disease)   . PVD (peripheral vascular disease) (HCC)     PTA/Stent right common iliac  . DDD (degenerative disc disease)   . Hx of colonoscopy   . COPD with asthma ([facility]) 09/08/2007  . OSA (obstructive sleep apnea)     NPSG 09/10/10- AHI 11.3/hr  . Hypertension     dr Percival Spanish  . History of radiation therapy 11/10/13- 12/29/13    left lung 6600 cGy in 33 sessions  . Diabetes mellitus without complication ([facility]) 1/59/4585  . Cancer ([facility])     lung  . Lung cancer ([facility]) 10/04/13    LUL squamous cell lung cancer  . History of cardiovascular stress test     Myoview 7/16:  Diaphragmatic attenuation, no ischemia, EF 56%; Low Risk  . Itching  due to drug 03/19/2016  . Nausea with vomiting 03/26/2016    ALLERGIES:  is allergic to carboplatin.  MEDICATIONS:  Current Outpatient Prescriptions  Medication Sig Dispense Refill  . ADVAIR DISKUS 500-50 MCG/DOSE AEPB 1 PUFF THEN RINSE MOUTH, TWICE DAILY MAINTENANCE 60 each 2  . albuterol (PROVENTIL) (2.5 MG/3ML) 0.083% nebulizer solution INHALE 1 VIAL IN NEBULIZER EVERY 4 HOURS AS NEEDED FOR WHEEZING  OR SHORTNESS OF BREATH 300 mL 2  . albuterol (VENTOLIN HFA) 108 (90 Base) MCG/ACT inhaler INHALE 2 PUFFS INTO THE LUNGS 4 TIMES DAILY AS NEEDED FOR WHEEZING (Patient taking differently: Inhale 2 puffs into the lungs every 4 (four) hours as needed for wheezing. INHALE 2 PUFFS INTO THE LUNGS 4 TIMES DAILY AS NEEDED FOR WHEEZING) 18 Inhaler 11  . ARIPiprazole (ABILIFY) 5 MG tablet Take 1 tablet (5 mg total) by mouth at bedtime.    . Armodafinil 250 MG tablet Take 250 mg by mouth every morning.  5  . aspirin 81 MG EC tablet TAKE 1 TABLET BY MOUTH EVERY DAY (Patient taking differently: TAKE 81 MG BY MOUTH EVERY DAY) 30 tablet 5  . atorvastatin (LIPITOR) 20 MG tablet TAKE 1 TABLET EVERY DAY (Patient taking differently: TAKE 20 MG  EVERY DAY IN THE EVENING) 90 tablet 3  . BELSOMRA 20 MG TABS Take 20 mg by mouth at bedtime as needed (for sleep). Reported on 03/29/2016  4  . clonazePAM (KLONOPIN) 1 MG tablet Take 2 mg by mouth at bedtime as needed for anxiety. Reported on 01/16/2016    . clopidogrel (PLAVIX) 75 MG tablet Take 1 tablet (75 mg total) by mouth daily. 90 tablet 3  . DALIRESP 500 MCG TABS tablet     . dextromethorphan-guaiFENesin (MUCINEX DM) 30-600 MG 12hr tablet Take 1 tablet by mouth 2 (two) times daily as needed for cough (congestion).    [name] esomeprazole (NEXIUM) 40 MG capsule TAKE ONE CAPSULE BY MOUTH EVERY DAY (Patient taking differently: TAKE 40 MG  BY MOUTH EVERY DAY) 90 capsule 3  . feeding supplement, ENSURE ENLIVE, (ENSURE ENLIVE) LIQD Take 237 mLs by mouth 2 (two) times daily between meals. 237 mL 12  . FLORA-Q (FLORA-Q) CAPS capsule Take 1 capsule by mouth daily. 20 capsule 0  . FLUoxetine (PROZAC) 40 MG capsule Take 40 mg by mouth every morning.    . gabapentin (NEURONTIN) 100 MG capsule Take 100 mg by mouth daily.     [name] glipiZIDE (GLUCOTROL) 5 MG tablet Take 0.5 tablets (2.5 mg total) by mouth daily before breakfast. 30 tablet 5  . isosorbide mononitrate (IMDUR) 120 MG 24 hr tablet  Take 120 mg by mouth daily.  3  . metoprolol succinate (TOPROL-XL) 25 MG 24 hr tablet TAKE 1/2 TABLET BY MOUTH DAILY (Patient taking differently: TAKE 12.5 MG BY MOUTH DAILY) 30 tablet 3  . mirtazapine (REMERON) 45 MG tablet Take 1 tablet (45 mg total) by mouth at bedtime.    [name] morphine (MS CONTIN) 30 MG 12 hr tablet Take 1 tablet (30 mg total) by mouth every 12 (twelve) hours. 60 tablet 0  . morphine (MSIR) 15 MG tablet Take 1 tablet (15 mg total) by mouth every 4 (four) hours as needed for severe pain. 30 tablet 0  . nitroGLYCERIN (NITROSTAT) 0.4 MG SL tablet Place 1 tablet (0.4 mg total) under the tongue every 5 (five) minutes as needed for chest pain. 25 tablet 3  . ondansetron (ZOFRAN) 8 MG tablet TAKE 1 TABLET (8 MG TOTAL) BY  MOUTH EVERY 8 (EIGHT) HOURS AS NEEDED FOR NAUSEA OR VOMITING. 20 tablet 0  . OXYGEN Inhale 2 L into the lungs at bedtime as needed.    . potassium chloride SA (K-DUR,KLOR-CON) 20 MEQ tablet Take 1 tablet (20 mEq total) by mouth every morning. Reported on 12/07/2015 90 tablet 1  . predniSONE (DELTASONE) 20 MG tablet Take 1 tablet (20 mg total) by mouth daily with breakfast. 16 tablet 0  . [START ON 06/05/2016] predniSONE (DELTASONE) 5 MG tablet Take 1 tablet (5 mg total) by mouth daily. Please resume after she steroid taper is finished over after 9 days 30 tablet 1  . prochlorperazine (COMPAZINE) 10 MG tablet TAKE 1 TABLET EVERY 6 HOURS AS NEEDED FOR NAUSEA AND VOMITING (Patient taking differently: TAKE 10 MG EVERY 6 HOURS AS NEEDED FOR NAUSEA AND VOMITING) 30 tablet 0  . tamsulosin (FLOMAX) 0.4 MG CAPS capsule Take 0.4 mg by mouth daily.  11  . theophylline (UNIPHYL) 400 MG 24 hr tablet TAKE 1 TABLET EVERY DAY (Patient taking differently: TAKE 400 MG BY MOUTH DAILY) 30 tablet 5  . Tiotropium Bromide Monohydrate (SPIRIVA RESPIMAT) 1.25 MCG/ACT AERS Inhale 2 puffs into the lungs daily. 4 g 11   No current facility-administered medications for this visit.    SURGICAL  HISTORY:  Past Surgical History  Procedure Laterality Date  . Spinal fusion  03/05/2007    L4-L5  . Hip surgery      left 'bone graft taken"  . Arm surgery      left elbow  . Shoulder surgery      right  . C-spine surgery    . Angioplasty    . Video bronchoscopy with endobronchial navigation N/A 10/04/2013    Procedure: VIDEO BRONCHOSCOPY WITH ENDOBRONCHIAL NAVIGATION;  Surgeon: Collene Gobble, MD;  Location: [facility];  Service: Thoracic;  Laterality: N/A;  . Back surgery      lower  . Anterior fusion cervical spine      cervical fusion  7 yrs ago ([facility])  . Colon surgery      '11 "Diverticulitis"  . Colonoscopy with propofol N/A 05/22/2015    Procedure: COLONOSCOPY WITH PROPOFOL;  Surgeon: Inda Castle, MD;  Location: [facility];  Service: Endoscopy;  Laterality: N/A;  . Peripheral vascular catheterization N/A 08/10/2015    Procedure: Lower Extremity Angiography;  Surgeon: Lorretta Harp, MD; Distal Ao OK, L-EIA stent OK, R-CIA 100% s/p overlapping 7 mm x 38 mm ICast stents, 50-60% R-CFA        REVIEW OF SYSTEMS:  A comprehensive review of systems was negative except for: Respiratory: positive for dyspnea on exertion   PHYSICAL EXAMINATION: General appearance: alert, cooperative and no distress Head: Normocephalic, without obvious abnormality, atraumatic Neck: no adenopathy, no JVD, supple, symmetrical, trachea midline and thyroid not enlarged, symmetric, no tenderness/mass/nodules Lymph nodes: Cervical, supraclavicular, and axillary nodes normal. Resp: wheezes bilaterally Back: symmetric, no curvature. ROM normal. No CVA tenderness. Cardio: regular rate and rhythm, S1, S2 normal, no murmur, click, rub or gallop GI: soft, non-tender; bowel sounds normal; no masses,  no organomegaly Extremities: extremities normal, atraumatic, no cyanosis or edema Neurologic: Alert and oriented X 3, normal strength and tone. Normal symmetric reflexes. Normal coordination and gait  ECOG  PERFORMANCE STATUS: 1 - Symptomatic but completely ambulatory  Blood pressure 147/75, pulse 94, temperature 99 F (37.2 C), temperature source Oral, resp. rate 19, height '5\' 3"'$  (1.6 m), weight 188 lb 6.4 oz (85.458 kg), SpO2 97 %.  LABORATORY DATA: Lab Results  Component Value Date   WBC 12.8* 05/27/2016   HGB 13.4 05/27/2016   HCT 41.8 05/27/2016   MCV 92.5 05/27/2016   PLT 160 05/27/2016      Chemistry      Component Value Date/Time   NA 138 05/22/2016 0519   NA 138 05/07/2016 1153   K 4.0 05/22/2016 0519   K 4.5 05/07/2016 1153   CL 99* 05/22/2016 0519   CO2 31 05/22/2016 0519   CO2 30* 05/07/2016 1153   BUN 16 05/22/2016 0519   BUN 13.0 05/07/2016 1153   CREATININE 0.79 05/22/2016 0519   CREATININE 0.8 05/07/2016 1153   CREATININE 0.80 08/07/2015 0959      Component Value Date/Time   CALCIUM 9.2 05/22/2016 0519   CALCIUM 9.8 05/07/2016 1153   ALKPHOS 67 05/19/2016 0542   ALKPHOS 93 05/07/2016 1153   AST 20 05/19/2016 0542   AST 15 05/07/2016 1153   ALT 17 05/19/2016 0542   ALT 22 05/07/2016 1153   BILITOT 0.7 05/19/2016 0542   BILITOT 0.48 05/07/2016 1153       RADIOGRAPHIC STUDIES: Dg Chest 2 View  05/18/2016  CLINICAL DATA:  Shortness of breath.  Lung cancer. EXAM: CHEST  2 VIEW COMPARISON:  04/21/2016 chest radiograph.  04/22/2016 chest CT . FINDINGS: Surgical hardware from ACDF overlies the lower cervical spine. Stable cardiomediastinal silhouette with normal heart size. No pneumothorax. No pleural effusion. Significant growth of 6.3 cm right lower lobe lung mass. Stable left apical pleural capping and distortion. No pulmonary edema. No acute consolidative airspace disease. IMPRESSION: Significant growth of 6.3 cm right lower lobe lung mass, suggesting either a lung metastasis or metachronous primary lung cancer. Stable post treatment changes in the left upper lung. Electronically Signed   By: Ilona Sorrel M.D.   On: 05/18/2016 11:54   Dg Chest Port 1  View  05/19/2016  CLINICAL DATA:  Healthcare associated pneumonia, hypertension, COPD, diabetes mellitus, former smoker, lung cancer EXAM: PORTABLE CHEST 1 VIEW COMPARISON:  Portable exam 0519 hours compared to 05/18/2016 and correlated with CT chest of 04/22/2016 FINDINGS: Patient's chin obscures the lung apices. Normal heart size, mediastinal contours, and pulmonary vascularity. LEFT apex scarring and volume loss with superior retraction of the LEFT hilum. Improved atelectasis at lower RIGHT lung. Underlying COPD. Again identified mass at inferior RIGHT hemithorax 6.6 x 5.1 cm. No acute infiltrate, pleural effusion, or pneumothorax. IMPRESSION: Persistent RIGHT lower lobe mass. Improved RIGHT basilar atelectasis. COPD changes with chronic scarring at LEFT apex. Electronically Signed   By: Lavonia Dana M.D.   On: 05/19/2016 08:47    ASSESSMENT AND PLAN: This is a very pleasant 60 years old white male with stage IIIA non-small cell lung cancer, squamous cell carcinoma currently undergoing concurrent chemoradiation with weekly carboplatin and paclitaxel status post 7 weeks of treatment. He tolerated his treatment fairly well with no significant adverse effects. This was followed by consolidation chemotherapy with carboplatin and paclitaxel status post 3 cycles, .carboplatin was discontinued after cycle #2 secondary to hypersensitivity reaction. The recent CT scan of the chest progressive enlargement of the right lower lobe lung nodule concerning for pulmonary metastasis or new synchronous tumor. The recent PET scan showed hypermetabolic activity in the 1.9 cm right lower lobe pulmonary nodule suspicious for metastasis. There was also small mediastinal node metastasis. CT-guided core biopsy of the right lower lobe pulmonary nodule showed poorly differentiated carcinoma. PDL 1 expression was 0% The patient was started on treatment  with single agent Abraxane status post 3 cycles but unfortunately restaging CT  scan of the chest showed evidence for disease progression. He is here today to start the first cycle of his treatment with immunotherapy with Nivolumab. I recommended for the patient to proceed with his treatment today as scheduled. He would come back for follow-up visit in 2 weeks for evaluation before starting cycle #2. He was advised to call immediately if he has any concerning symptoms in the interval. The patient voices understanding of current disease status and treatment options and is in agreement with the current care plan.  All questions were answered. The patient knows to call the clinic with any problems, questions or concerns. We can certainly see the patient much sooner if necessary.  Disclaimer: This note was dictated with voice recognition software. Similar sounding words can inadvertently be transcribed and may not be corrected upon review.

## 2016-05-28 DIAGNOSIS — J189 Pneumonia, unspecified organism: Secondary | ICD-10-CM | POA: Diagnosis not present

## 2016-05-28 DIAGNOSIS — J45909 Unspecified asthma, uncomplicated: Secondary | ICD-10-CM | POA: Diagnosis not present

## 2016-05-28 DIAGNOSIS — E1151 Type 2 diabetes mellitus with diabetic peripheral angiopathy without gangrene: Secondary | ICD-10-CM | POA: Diagnosis not present

## 2016-05-28 DIAGNOSIS — I2510 Atherosclerotic heart disease of native coronary artery without angina pectoris: Secondary | ICD-10-CM | POA: Diagnosis not present

## 2016-05-28 DIAGNOSIS — C3412 Malignant neoplasm of upper lobe, left bronchus or lung: Secondary | ICD-10-CM | POA: Diagnosis not present

## 2016-05-28 DIAGNOSIS — J440 Chronic obstructive pulmonary disease with acute lower respiratory infection: Secondary | ICD-10-CM | POA: Diagnosis not present

## 2016-05-28 DIAGNOSIS — D696 Thrombocytopenia, unspecified: Secondary | ICD-10-CM | POA: Diagnosis not present

## 2016-05-28 DIAGNOSIS — J9611 Chronic respiratory failure with hypoxia: Secondary | ICD-10-CM | POA: Diagnosis not present

## 2016-05-28 DIAGNOSIS — I10 Essential (primary) hypertension: Secondary | ICD-10-CM | POA: Diagnosis not present

## 2016-05-29 ENCOUNTER — Encounter: Payer: Self-pay | Admitting: Internal Medicine

## 2016-05-29 ENCOUNTER — Telehealth: Payer: Self-pay | Admitting: *Deleted

## 2016-05-29 NOTE — Telephone Encounter (Signed)
Follow up call place to pt. Pt denies any problems and has no concerns at this time. Encouraged pt to call office with any questions.

## 2016-05-29 NOTE — Progress Notes (Signed)
left in pod, and left for [name] to sign- faxed [phone] and copy for patient was mailed

## 2016-05-29 NOTE — Telephone Encounter (Signed)
-----   Message from Herschell Dimes, RN sent at 05/27/2016  3:36 PM EDT ----- Regarding: Julien Nordmann - follow up first [facility]: [phone] Dr. Worthy Flank pt Ernest [name] tolerated his first [place] well.  [phone]

## 2016-05-29 NOTE — Progress Notes (Signed)
left in pod, and left for [name] to sign

## 2016-05-30 ENCOUNTER — Other Ambulatory Visit: Payer: Self-pay | Admitting: *Deleted

## 2016-05-30 ENCOUNTER — Telehealth: Payer: Self-pay

## 2016-05-30 NOTE — Telephone Encounter (Signed)
Home Health Cert/Plan of Care received (05/08/2016 - 07/06/2016) and placed on MD's desk for signature

## 2016-05-30 NOTE — Patient Outreach (Signed)
[facility]) Care Management [facility] CM Telephone Outreach, Transition of Care day 7 05/30/2016  Prabhav Faulkenberry Conran [birth year] [phone]  Successful telephone outreach to [employer]" E Passmore, 60 y.o. male followed by [facility] for transition of care after recent inpatient hospital visit, as well as ongoing self management of chronic disease state of COPD. Mr. Mcmiller has lung cancer and started a new round of chemotherapy in April 2017. [name] was recently admitted May 28-April 25, 2016 for increased shortness of breath/ dyspnea; it was determined that he had healthcare associated pneumonia. Unfortunately, [name] was re-admitted to the hospital June 24-29, 2017, again for COPD exacerbation and HCAP. [name] is now followed by [facility] for transition of care after most recent hospital discharge on April 25, 2016.  Today, [name] reports that he "is doing better," after his most recent hospitalization, and states that he attended his oncology provider appointment on Monday May 27, 2016, where a new chemotherapy agent was initiated. [name] states that since then, he has had ongoing nausea which is well controlled with his regular medications, but "is rough" on him.  [name] reports that Dr. Earlie Server discontinued his prednisone, which "works against" the new chemo agent he is currently taking.  [name] reported that he completed his post-hospital discharge antibiotics that were prescribed.  [name] reports that he "is breathing better" and continues to maintain his home O2 at 2 L/min, stating that he continues to "check his oxygen level frequently" with his home pulse oximetry. [name] states that "they have been 96-97% while on 2 L/min. [name] denies respiratory distress at the present time.  [name] reported that he is participating with home health [facility]) RN services, and had a [place] visit on Tuesday, May 28, 2016. [name] is not sure whether or not he has [facility] PT services ordered, stating  that he has a follow up visit with his PCP next week, and "will find out."  [name] stated that he has all of his medications and is taking as prescribed, and he denies further concerns, questions, needs, or problems at this time. [name] and I confirmed our previously scheduled [place] in-home visit for June 06, 2016. I confirmed that [name] has the [facility] 24-hour nurse call line phone number, as well as my direct contact information, should he have questions or needs prior to next [place] outreach.  Plan: [name] will take his medications as prescribed and keep all scheduled provider appointments.  [name] will work with home health services as ordered post-hospital discharge.  [name] will call his providers for any new concerns, issues, questions or problems.  [place] outreach for continued transition of care with in-home visit scheduled for next week.  [name], RN, BSN, [employer] [facility] Care Management  [phone]

## 2016-06-03 ENCOUNTER — Other Ambulatory Visit: Payer: Self-pay | Admitting: Internal Medicine

## 2016-06-03 ENCOUNTER — Encounter: Payer: Self-pay | Admitting: Internal Medicine

## 2016-06-03 NOTE — Progress Notes (Signed)
I mailed copy to wife- too large to email. See prev notes. Her vmail was full so could not leave message [phone]

## 2016-06-03 NOTE — Progress Notes (Signed)
I sent revised fmla forms for wife and emailed her copy for her recrds

## 2016-06-03 NOTE — Telephone Encounter (Signed)
Paperwork signed, faxed, copy sent to scan 

## 2016-06-04 ENCOUNTER — Telehealth: Payer: Self-pay | Admitting: *Deleted

## 2016-06-04 DIAGNOSIS — G894 Chronic pain syndrome: Secondary | ICD-10-CM

## 2016-06-04 MED ORDER — MORPHINE SULFATE 15 MG PO TABS: 15.0000 mg | ORAL_TABLET | ORAL | Status: DC | PRN

## 2016-06-04 MED ORDER — MORPHINE SULFATE ER 30 MG PO TBCR: 30.0000 mg | EXTENDED_RELEASE_TABLET | Freq: Two times a day (BID) | ORAL | Status: DC

## 2016-06-04 NOTE — Telephone Encounter (Signed)
Spouse Rodena Piety called with Ernest [name] reporting "bad charlie horse" that started yesterday afternoon.  He woke up with it continuing today that is very painful to his left calf.  No swelling.  He is hopping around because the back of his leg is stiff.  He's not had a charlie horse in years.  Is this related to the [place]?"  Return number [phone]."

## 2016-06-04 NOTE — Telephone Encounter (Signed)
Left msg on triage stating pt is out of his pain med " Morphine". Requesting refill MD out of office pls advise...[name]

## 2016-06-04 NOTE — Telephone Encounter (Signed)
I called pt and he reports his left calf is "stiff and painful when I walk- It is like I used my left leg too much but I am using it the same as my right leg. I am stretching my leg , it feels tight like a muscle strain" .  He denies swelling or redness .He left a message with Dr Ronnald Ramp -PCP about a refill and I told him to call him back about the leg as our [facility] is closed today and Dr Julien Nordmann is out of the office. If he cannot be seen by Dr Ronnald Ramp then I instructed him to go to ED.

## 2016-06-04 NOTE — Telephone Encounter (Signed)
Printed and signed but needs visit with PCP for management. No controlled substance policy on file. Not filling consistently as last 04/16/16 and prior to that 01/10/16.

## 2016-06-05 ENCOUNTER — Ambulatory Visit (INDEPENDENT_AMBULATORY_CARE_PROVIDER_SITE_OTHER): Payer: Commercial Managed Care - HMO | Admitting: Internal Medicine

## 2016-06-05 ENCOUNTER — Encounter: Payer: Self-pay | Admitting: Internal Medicine

## 2016-06-05 VITALS — BP 134/76 | HR 107 | Temp 98.2°F | Resp 20 | Wt 188.0 lb

## 2016-06-05 DIAGNOSIS — M79662 Pain in left lower leg: Secondary | ICD-10-CM | POA: Insufficient documentation

## 2016-06-05 DIAGNOSIS — J45909 Unspecified asthma, uncomplicated: Secondary | ICD-10-CM

## 2016-06-05 DIAGNOSIS — R0600 Dyspnea, unspecified: Secondary | ICD-10-CM

## 2016-06-05 DIAGNOSIS — J449 Chronic obstructive pulmonary disease, unspecified: Secondary | ICD-10-CM

## 2016-06-05 DIAGNOSIS — G894 Chronic pain syndrome: Secondary | ICD-10-CM

## 2016-06-05 DIAGNOSIS — R0602 Shortness of breath: Secondary | ICD-10-CM | POA: Insufficient documentation

## 2016-06-05 DIAGNOSIS — R0689 Other abnormalities of breathing: Secondary | ICD-10-CM

## 2016-06-05 MED ORDER — MORPHINE SULFATE ER 30 MG PO TBCR: 30.0000 mg | EXTENDED_RELEASE_TABLET | Freq: Two times a day (BID) | ORAL | Status: DC

## 2016-06-05 NOTE — Telephone Encounter (Signed)
Notified pt w/MD response put rx in cabinet for pick-up...Ernest [name]

## 2016-06-05 NOTE — Assessment & Plan Note (Signed)
Incidental request to refill one month mso4 as he is due, has had good overall control, no evidence for misuse or diversion, ok for refill - given hardcopy

## 2016-06-05 NOTE — Progress Notes (Signed)
Pre visit review using our clinic review tool, if applicable. No additional management support is needed unless otherwise documented below in the visit note. 

## 2016-06-05 NOTE — Assessment & Plan Note (Signed)
O/w I suspect stable, and volume stable, cont same tx,  to f/u any worsening symptoms or concerns

## 2016-06-05 NOTE — Patient Instructions (Signed)
You do appear to have left calf swelling that is new, or at least left calf muscle spasm  Since your breathing is worse than usual, and with your history of cancer, We need to be sure you do not have a blood clot in the leg or the chest  Please go to [facility] ER for further evaluation  You are given the Morphine prescription today per your usual

## 2016-06-05 NOTE — Assessment & Plan Note (Signed)
New onset x 2 -3 days, with either swelling or spasm of the left gastroc noted, does not have significant distal LLE swelling, but cant r/o DVT with this hx in the setting of malignancy - to go to [facility] tonight

## 2016-06-05 NOTE — Assessment & Plan Note (Signed)
Subjectively worse over baseline, in the setting of possible DVT and lung cancer/copd without seeming exacerbation, no overt sign of volume overload, should go to [facility] ER for D dimer and/or CTA chest - r/o PE

## 2016-06-05 NOTE — Progress Notes (Signed)
Subjective:    Patient ID: Ernest [name], male    DOB: 1956/07/22, 60 y.o.   MRN: [id number]  HPI  Here after contacting oncology by phone, and rec'd for OV evalaution, pt c/ow 2-3 days onset left calf pain and swelling, started only with walking, but now constant, without trauma, fever, overuse, LBP or other symptoms of neuro or vasc claudication.  Has chronic dyspnea related to COPD and lung cancer, but has at least mild worsening sob/doe as well, but no other specific chest pain, wheezing, orthopnea, PND, increased LE swelling, palpitations, or syncope.  Pt denies fever, ST, cough.  On plavix but no other anticoagulant.  No prior hx DVT or PE.   Past Medical History  Diagnosis Date  . CAD (coronary artery disease)     Left Main 30% stenosis, LAD 20 - 30 % stenosis, first and second diagonal branchesat 40 - 50%  stenosis with small arteries, circumflex had 30% stenosis in the large obtuse marginal, RCA at 70 - 80%  stenosis [not felt to be occlusive after evaluation with flow wire], distal 50 - 60% stenosis - [name][  . COPD (chronic obstructive pulmonary disease) (HCC)     Dr. Baird Lyons  . Depression   . Anxiety   . Hyperlipidemia   . Chronic insomnia   . Gout   . GERD (gastroesophageal reflux disease)   . PVD (peripheral vascular disease) (HCC)     PTA/Stent right common iliac  . DDD (degenerative disc disease)   . Hx of colonoscopy   . COPD with asthma ([place]) 09/08/2007  . OSA (obstructive sleep apnea)     NPSG 09/10/10- AHI 11.3/hr  . Hypertension     dr Percival Spanish  . History of radiation therapy 11/10/13- 12/29/13    left lung 6600 cGy in 33 sessions  . Diabetes mellitus without complication ([place]) 03/27/5464  . Cancer ([place])     lung  . Lung cancer ([place]) 10/04/13    LUL squamous cell lung cancer  . History of cardiovascular stress test     Myoview 7/16:  Diaphragmatic attenuation, no ischemia, EF 56%; Low Risk  . Itching due to drug 03/19/2016  . Nausea with  vomiting 03/26/2016   Past Surgical History  Procedure Laterality Date  . Spinal fusion  03/05/2007    L4-L5  . Hip surgery      left 'bone graft taken"  . Arm surgery      left elbow  . Shoulder surgery      right  . C-spine surgery    . Angioplasty    . Video bronchoscopy with endobronchial navigation N/A 10/04/2013    Procedure: VIDEO BRONCHOSCOPY WITH ENDOBRONCHIAL NAVIGATION;  Surgeon: Collene Gobble, MD;  Location: [facility];  Service: Thoracic;  Laterality: N/A;  . Back surgery      lower  . Anterior fusion cervical spine      cervical fusion  7 yrs ago ([facility])  . Colon surgery      '11 "Diverticulitis"  . Colonoscopy with propofol N/A 05/22/2015    Procedure: COLONOSCOPY WITH PROPOFOL;  Surgeon: Inda Castle, MD;  Location: [facility];  Service: Endoscopy;  Laterality: N/A;  . Peripheral vascular catheterization N/A 08/10/2015    Procedure: Lower Extremity Angiography;  Surgeon: Lorretta Harp, MD; Distal Ao OK, L-EIA stent OK, R-CIA 100% s/p overlapping 7 mm x 38 mm ICast stents, 50-60% R-CFA        reports that he quit smoking about  2 years ago. His smoking use included Cigarettes. He smoked 0.00 packs per day for 43 years. He has never used smokeless tobacco. He reports that he does not drink alcohol or use illicit drugs. family history includes Cancer in his sister; Emphysema in his sister; Heart attack in his mother and sister; Hypertension in his brother; Lung cancer in his sister. There is no history of Stroke. Allergies  Allergen Reactions  . Carboplatin Shortness Of Breath, Swelling and Rash    Swelling of lips, rash on face,eyes and head   Current Outpatient Prescriptions on File Prior to Visit  Medication Sig Dispense Refill  . ADVAIR DISKUS 500-50 MCG/DOSE AEPB 1 PUFF THEN RINSE MOUTH, TWICE DAILY MAINTENANCE 60 each 2  . albuterol (PROVENTIL) (2.5 MG/3ML) 0.083% nebulizer solution INHALE 1 VIAL IN NEBULIZER EVERY 4 HOURS AS NEEDED FOR WHEEZING OR SHORTNESS OF  BREATH 300 mL 2  . ARIPiprazole (ABILIFY) 5 MG tablet Take 1 tablet (5 mg total) by mouth at bedtime.    . Armodafinil 250 MG tablet Take 250 mg by mouth every morning.  5  . aspirin 81 MG EC tablet TAKE 1 TABLET BY MOUTH EVERY DAY (Patient taking differently: TAKE 81 MG BY MOUTH EVERY DAY) 30 tablet 5  . atorvastatin (LIPITOR) 20 MG tablet TAKE 1 TABLET EVERY DAY (Patient taking differently: TAKE 20 MG  EVERY DAY IN THE EVENING) 90 tablet 3  . BELSOMRA 20 MG TABS Take 20 mg by mouth at bedtime as needed (for sleep). Reported on 03/29/2016  4  . clonazePAM (KLONOPIN) 1 MG tablet Take 2 mg by mouth at bedtime as needed for anxiety. Reported on 01/16/2016    . clopidogrel (PLAVIX) 75 MG tablet Take 1 tablet (75 mg total) by mouth daily. 90 tablet 3  . DALIRESP 500 MCG TABS tablet     . dextromethorphan-guaiFENesin (MUCINEX DM) 30-600 MG 12hr tablet Take 1 tablet by mouth 2 (two) times daily as needed for cough (congestion).    [name] esomeprazole (NEXIUM) 40 MG capsule TAKE ONE CAPSULE BY MOUTH EVERY DAY (Patient taking differently: TAKE 40 MG  BY MOUTH EVERY DAY) 90 capsule 3  . feeding supplement, ENSURE ENLIVE, (ENSURE ENLIVE) LIQD Take 237 mLs by mouth 2 (two) times daily between meals. 237 mL 12  . FLORA-Q (FLORA-Q) CAPS capsule Take 1 capsule by mouth daily. 20 capsule 0  . FLUoxetine (PROZAC) 40 MG capsule Take 40 mg by mouth every morning.    . gabapentin (NEURONTIN) 100 MG capsule Take 100 mg by mouth daily.     [name] glipiZIDE (GLUCOTROL) 5 MG tablet Take 0.5 tablets (2.5 mg total) by mouth daily before breakfast. 30 tablet 5  . isosorbide mononitrate (IMDUR) 120 MG 24 hr tablet Take 120 mg by mouth daily.  3  . metoprolol succinate (TOPROL-XL) 25 MG 24 hr tablet TAKE 1/2 TABLET BY MOUTH DAILY (Patient taking differently: TAKE 12.5 MG BY MOUTH DAILY) 30 tablet 3  . mirtazapine (REMERON) 45 MG tablet Take 1 tablet (45 mg total) by mouth at bedtime.    [name] morphine (MSIR) 15 MG tablet Take 1 tablet (15 mg  total) by mouth every 4 (four) hours as needed for severe pain. 30 tablet 0  . nitroGLYCERIN (NITROSTAT) 0.4 MG SL tablet Place 1 tablet (0.4 mg total) under the tongue every 5 (five) minutes as needed for chest pain. 25 tablet 3  . ondansetron (ZOFRAN) 8 MG tablet TAKE 1 TABLET (8 MG TOTAL) BY MOUTH EVERY 8 (EIGHT)  HOURS AS NEEDED FOR NAUSEA OR VOMITING. 20 tablet 0  . OXYGEN Inhale 2 L into the lungs at bedtime as needed.    . potassium chloride SA (K-DUR,KLOR-CON) 20 MEQ tablet Take 1 tablet (20 mEq total) by mouth every morning. Reported on 12/07/2015 90 tablet 1  . prochlorperazine (COMPAZINE) 10 MG tablet TAKE 1 TABLET EVERY 6 HOURS AS NEEDED FOR NAUSEA AND VOMITING (Patient taking differently: TAKE 10 MG EVERY 6 HOURS AS NEEDED FOR NAUSEA AND VOMITING) 30 tablet 0  . tamsulosin (FLOMAX) 0.4 MG CAPS capsule Take 0.4 mg by mouth daily.  11  . theophylline (UNIPHYL) 400 MG 24 hr tablet TAKE 1 TABLET EVERY DAY (Patient taking differently: TAKE 400 MG BY MOUTH DAILY) 30 tablet 5  . Tiotropium Bromide Monohydrate (SPIRIVA RESPIMAT) 1.25 MCG/ACT AERS Inhale 2 puffs into the lungs daily. 4 g 11  . VENTOLIN HFA 108 (90 Base) MCG/ACT inhaler INHALE 2 PUFFS INTO THE LUNGS 4 TIMES DAILY AS NEEDED FOR WHEEZING 18 Inhaler 2   No current facility-administered medications on file prior to visit.   Review of Systems  Constitutional: Negative for unusual diaphoresis or night sweats HENT: Negative for ear swelling or discharge Eyes: Negative for worsening visual haziness  Respiratory: Negative for choking and stridor.   Gastrointestinal: Negative for distension or worsening eructation Genitourinary: Negative for retention or change in urine volume.  Musculoskeletal: Negative for other MSK pain or swelling Skin: Negative for color change and worsening wound Neurological: Negative for tremors and numbness other than noted  Psychiatric/Behavioral: Negative for decreased concentration or agitation other than  above       Objective:   Physical Exam BP 134/76 mmHg  Pulse 107  Temp(Src) 98.2 F (36.8 C) (Oral)  Resp 20  Wt 188 lb (85.276 kg)  SpO2 90% VS noted, mild uncomfortable appearing, non toxic Constitutional: Pt appears in no apparent distress HENT: Head: NCAT.  Right Ear: External ear normal.  Left Ear: External ear normal.  Eyes: . Pupils are equal, round, and reactive to light. Conjunctivae and EOM are normal Neck: Normal range of motion. Neck supple.  Cardiovascular: Normal rate and regular rhythm.   Pulmonary/Chest: Effort normal and breath sounds decreased bilat without overt rales or wheezing. , no accessory muscle use LLE - + tender calf, + homans, with visible swelling to calf greater than right calf circumference (though not measured)  no specific cords, but has fairly minimal more distal LLE swelling, no erythema noted, ankle and foot somewhat cooler to palpate than more prox leg but not cold and dorsalis pedis 1+ bilat  Neurological: Pt is alert. Not confused , motor grossly intact Skin: Skin is warm. No rash Psychiatric: Pt behavior is normal. No agitation.      Assessment & Plan:

## 2016-06-06 ENCOUNTER — Encounter: Payer: Self-pay | Admitting: *Deleted

## 2016-06-06 ENCOUNTER — Other Ambulatory Visit: Payer: Self-pay | Admitting: *Deleted

## 2016-06-06 DIAGNOSIS — I10 Essential (primary) hypertension: Secondary | ICD-10-CM | POA: Diagnosis not present

## 2016-06-06 DIAGNOSIS — I2510 Atherosclerotic heart disease of native coronary artery without angina pectoris: Secondary | ICD-10-CM | POA: Diagnosis not present

## 2016-06-06 DIAGNOSIS — J45909 Unspecified asthma, uncomplicated: Secondary | ICD-10-CM | POA: Diagnosis not present

## 2016-06-06 DIAGNOSIS — J189 Pneumonia, unspecified organism: Secondary | ICD-10-CM | POA: Diagnosis not present

## 2016-06-06 DIAGNOSIS — J440 Chronic obstructive pulmonary disease with acute lower respiratory infection: Secondary | ICD-10-CM | POA: Diagnosis not present

## 2016-06-06 DIAGNOSIS — E1151 Type 2 diabetes mellitus with diabetic peripheral angiopathy without gangrene: Secondary | ICD-10-CM | POA: Diagnosis not present

## 2016-06-06 DIAGNOSIS — C3412 Malignant neoplasm of upper lobe, left bronchus or lung: Secondary | ICD-10-CM | POA: Diagnosis not present

## 2016-06-06 DIAGNOSIS — D696 Thrombocytopenia, unspecified: Secondary | ICD-10-CM | POA: Diagnosis not present

## 2016-06-06 DIAGNOSIS — J9611 Chronic respiratory failure with hypoxia: Secondary | ICD-10-CM | POA: Diagnosis not present

## 2016-06-06 NOTE — Patient Outreach (Signed)
[facility]) Care Management  [place] CM Routine Home Visit, Transition of Care day 14 06/06/2016  Ernest [name] [birth year] [id number]  Ernest [name] is an 60 y.o. male followed by Ernest [name] for transition of care after recent inpatient hospital visit, as well as ongoing self management of chronic disease state of COPD.  [name] has lung cancer and started a new round of chemotherapy in April 2017.  Ernest [name] was recently admitted May 28-April 25, 2016 for increased shortness of breath/ dyspnea; it was determined that he had healthcare associated pneumonia.  Unfortunately, Ernest [name] was re-admitted to the hospital June 24-29, 2017, again for COPD exacerbation and HCAP.  Ernest [name] is now followed by [place] for transition of care after most recent hospital discharge on April 25, 2016.  Today, Ernest [name] was working with his home health [facility]) nurse when I arrived for our visit; Ernest [name] reports that [facility] has been helpful to him; [facility] RN "Elta Guadeloupe" [name]) reports that he will be seeing Ernest [name] one time/ week for another 4 weeks.   Ernest [name] states that although he continues to "feel better," after his hospital discharge, he has continued to have ongoing nausea which is fairly well controlled with his regular medications, but "is rough" on him.  Today, he also reports new onset of (L) calf pain, stating that "it feels like a muscle strain," but admits that when he saw his PCP yesterday for this issue, he was advised to go to the ED to R/O DVT.  (see ROS/ PE).  Ernest [name] did not go to the ED yesterday as advised, but stated that he believed he would go this afternoon when his wife got home from work, "to make sure it isn't anything serious."  I encouraged the patient to go to the ED as he voiced plans to do.  Ernest [name] reports that he "is breathing better" and continues to maintain his home O2 at 2 L/min, stating that he continues to "check his oxygen level frequently" with his home pulse oximetry.  Ernest [name]  states that "they have been 96-97% while on 2 L/min.  Ernest [name] is in no respiratory distress at the present time.  Ernest [name] stated that he has all of his medications and is taking as prescribed, and we reviewed his medications together this afternoon. Ernest [name] has a good understanding of the purpose, dosing, and scheduling of his medications.  Today, [name] and I had a general discussion on his goals of care with his lung cancer/ chemotherapy, and Ernest [name] stated that he had not discussed specific goals of care or of his treatment with his oncologist at this point in time.  Ernest [name] clarified that his personal care goal is to "be cured of cancer," where he can return to his previous way of living where he "can do things like I used to."  I encouraged Ernest [name] to talk to his oncologist about his personal care goals, as well as goals of current treatment, and he agreed to do so.  We also discussed [facility] program, and I explained that they have a focus on cancer, and may have additional resources for his care available; Ernest [name] agreed with me making a referral to [facility].  Ernest [name] denies further concerns, questions, needs, or problems at this time.  I explained that another [place] RN CM would be contacting him next week by telephone for ongoing transition of care, and confirmed that he had all [facility] numbers, including 24-hour nurse line.  Subjective: "I want to  get well enough where I can go back to my old life, and go outside and do things again.  I would like for my cancer to be completely cured."  Objective:    BP 106/62 mmHg  Pulse 92  Resp 16  Wt 188 lb (85.276 kg)  SpO2 97%   Review of Systems  Constitutional: Positive for malaise/fatigue. Negative for fever.  Respiratory: Positive for cough, sputum production and shortness of breath. Negative for wheezing.   Cardiovascular: Positive for leg swelling. Negative for chest pain and palpitations.       (L) leg is minimally swollen in upper back calf;  no LE swelling noted in (R) leg  Gastrointestinal: Positive for nausea. Negative for abdominal pain.       Patient attributes nausea to recent chemo administration on Tuesday June 04, 2016; patient states this is ongoing.  Genitourinary: Negative.   Musculoskeletal: Positive for myalgias and back pain. Negative for falls.       Chronic back pain; new onset of (L) posterior calf pain this week; went to PCP yesterday for eval and was advised to go to ED for further testing to R/O DVT; pt. did not go to ED yesterday and states that he plans to go tonight when his wife gets off work.  No visible redness present in (L) LE, minimally swollen, slightly warm to touch.  Patient states "4/10" pain feels "tight, like I have strained a muscle."  Neurological: Negative.  Negative for weakness.  Psychiatric/Behavioral: Positive for depression. The patient is not nervous/anxious.     Physical Exam  Constitutional: He is oriented to person, place, and time. He appears well-developed and well-nourished.  Cardiovascular: Normal rate, regular rhythm, normal heart sounds and intact distal pulses.   Pulses:      Radial pulses are 2+ on the right side, and 2+ on the left side.       Dorsalis pedis pulses are 2+ on the right side, and 2+ on the left side.  Respiratory: Effort normal. No respiratory distress. He has no wheezes. He has no rales.  Bilateral breath sounds are distant x A/L/P auscultation; pt. wears O2 at 2 L/min at all times  GI: Soft. Bowel sounds are normal.  Musculoskeletal: He exhibits edema and tenderness.  Minimal edema noted in upper (L) calf (see ROS); pt. describes tenderness and pain in the area that increase when palpated  Neurological: He is alert and oriented to person, place, and time.  Skin: Skin is warm and dry. No erythema.  Psychiatric: He has a normal mood and affect. His behavior is normal. Judgment and thought content normal.    Encounter Medications:   Outpatient Encounter  Prescriptions as of 06/06/2016  Medication Sig Note  . ADVAIR DISKUS 500-50 MCG/DOSE AEPB 1 PUFF THEN RINSE MOUTH, TWICE DAILY MAINTENANCE   . albuterol (PROVENTIL) (2.5 MG/3ML) 0.083% nebulizer solution INHALE 1 VIAL IN NEBULIZER EVERY 4 HOURS AS NEEDED FOR WHEEZING OR SHORTNESS OF BREATH   . ARIPiprazole (ABILIFY) 5 MG tablet Take 1 tablet (5 mg total) by mouth at bedtime.   . Armodafinil 250 MG tablet Take 250 mg by mouth every morning.   [name] aspirin 81 MG EC tablet TAKE 1 TABLET BY MOUTH EVERY DAY (Patient taking differently: TAKE 81 MG BY MOUTH EVERY DAY)   . atorvastatin (LIPITOR) 20 MG tablet TAKE 1 TABLET EVERY DAY (Patient taking differently: TAKE 20 MG  EVERY DAY IN THE EVENING)   . BELSOMRA 20 MG TABS Take 20  mg by mouth at bedtime as needed (for sleep). Reported on 03/29/2016   . clonazePAM (KLONOPIN) 1 MG tablet Take 2 mg by mouth at bedtime as needed for anxiety. Reported on 01/16/2016   . clopidogrel (PLAVIX) 75 MG tablet Take 1 tablet (75 mg total) by mouth daily.   [name] dextromethorphan-guaiFENesin (MUCINEX DM) 30-600 MG 12hr tablet Take 1 tablet by mouth 2 (two) times daily as needed for cough (congestion).   [name] esomeprazole (NEXIUM) 40 MG capsule TAKE ONE CAPSULE BY MOUTH EVERY DAY (Patient taking differently: TAKE 40 MG  BY MOUTH EVERY DAY)   . feeding supplement, ENSURE ENLIVE, (ENSURE ENLIVE) LIQD Take 237 mLs by mouth 2 (two) times daily between meals.   [name] FLUoxetine (PROZAC) 40 MG capsule Take 40 mg by mouth every morning.   . gabapentin (NEURONTIN) 100 MG capsule Take 100 mg by mouth daily.    [name] glipiZIDE (GLUCOTROL) 5 MG tablet Take 0.5 tablets (2.5 mg total) by mouth daily before breakfast.   . isosorbide mononitrate (IMDUR) 120 MG 24 hr tablet Take 120 mg by mouth daily.   . metoprolol succinate (TOPROL-XL) 25 MG 24 hr tablet TAKE 1/2 TABLET BY MOUTH DAILY (Patient taking differently: TAKE 12.5 MG BY MOUTH DAILY)   . mirtazapine (REMERON) 45 MG tablet Take 1 tablet (45 mg total)  by mouth at bedtime.   [name] morphine (MS CONTIN) 30 MG 12 hr tablet Take 1 tablet (30 mg total) by mouth every 12 (twelve) hours.   [name] morphine (MSIR) 15 MG tablet Take 1 tablet (15 mg total) by mouth every 4 (four) hours as needed for severe pain.   . nitroGLYCERIN (NITROSTAT) 0.4 MG SL tablet Place 1 tablet (0.4 mg total) under the tongue every 5 (five) minutes as needed for chest pain. 05/18/2016: Pt has not used recently   . ondansetron (ZOFRAN) 8 MG tablet TAKE 1 TABLET (8 MG TOTAL) BY MOUTH EVERY 8 (EIGHT) HOURS AS NEEDED FOR NAUSEA OR VOMITING.   . OXYGEN Inhale 2 L into the lungs at bedtime as needed. 01/16/2016: On 3L [place]   . potassium chloride SA (K-DUR,KLOR-CON) 20 MEQ tablet Take 1 tablet (20 mEq total) by mouth every morning. Reported on 12/07/2015   . prochlorperazine (COMPAZINE) 10 MG tablet TAKE 1 TABLET EVERY 6 HOURS AS NEEDED FOR NAUSEA AND VOMITING (Patient taking differently: TAKE 10 MG EVERY 6 HOURS AS NEEDED FOR NAUSEA AND VOMITING)   . theophylline (UNIPHYL) 400 MG 24 hr tablet TAKE 1 TABLET EVERY DAY (Patient taking differently: TAKE 400 MG BY MOUTH DAILY)   . Tiotropium Bromide Monohydrate (SPIRIVA RESPIMAT) 1.25 MCG/ACT AERS Inhale 2 puffs into the lungs daily.   . VENTOLIN HFA 108 (90 Base) MCG/ACT inhaler INHALE 2 PUFFS INTO THE LUNGS 4 TIMES DAILY AS NEEDED FOR WHEEZING   . DALIRESP 500 MCG TABS tablet Reported on 06/06/2016 06/06/2016: Reports not taking at present   . FLORA-Q (FLORA-Q) CAPS capsule Take 1 capsule by mouth daily. (Patient not taking: Reported on 06/06/2016) 06/06/2016: Completed this medication, reports took as prescribed  . tamsulosin (FLOMAX) 0.4 MG CAPS capsule Take 0.4 mg by mouth daily. Reported on 06/06/2016 06/06/2016: Patient reports not taking this medication    No facility-administered encounter medications on file as of 06/06/2016.    Functional Status:   In your present state of health, do you have any difficulty performing the following activities:  05/18/2016 04/21/2016  Hearing? N N  Vision? N N  Difficulty concentrating or making decisions?  N Y  Walking or climbing stairs? Y Y  Dressing or bathing? N N  Doing errands, shopping? N N    Fall/Depression Screening:    PHQ 2/9 Scores 02/02/2016 12/07/2015 10/23/2015 10/16/2015  PHQ - 2 Score '1 1 1 1    '$ Assessment:  Although Ernest [name] appears to feel better after his most recent hospital discharge, he remains depressed and frustrated with his overall state of health and ongoing health issues.  Ernest [name] does not want to be re-admitted to the hospital, but recognizes that he should follow up on his new onset of (L) calf pain.  Ernest [name] is receptive to having a conversation with his oncologist about care/treatment goals, as well as to me making a referral on his behalf to [facility].   Plan:   Ernest [name] will go to the ED to have his new (L) calf pain evaluated.  Ernest [name] will continue to take his medications as prescribed and keep all scheduled provider appointments.  Ernest [name] will continue to work with home health services as ordered post-hospital discharge.  Ernest [name] will call his providers for any new concerns, issues, questions or problems.  [facility] RN CM will make referral to [facility] for patient assessment.   [place] outreach for continued transition of care with telephone outreach next week.  [name], RN, BSN, [employer] [facility] Care Management  [phone]

## 2016-06-07 ENCOUNTER — Emergency Department (HOSPITAL_COMMUNITY)
Admission: EM | Admit: 2016-06-07 | Discharge: 2016-06-07 | Disposition: A | Discharge status: Home / Self Care | Payer: Commercial Managed Care - HMO | Attending: Emergency Medicine | Admitting: Emergency Medicine

## 2016-06-07 ENCOUNTER — Emergency Department (HOSPITAL_COMMUNITY): Payer: Commercial Managed Care - HMO

## 2016-06-07 ENCOUNTER — Other Ambulatory Visit: Payer: Self-pay | Admitting: *Deleted

## 2016-06-07 ENCOUNTER — Emergency Department (HOSPITAL_BASED_OUTPATIENT_CLINIC_OR_DEPARTMENT_OTHER)
Admit: 2016-06-07 | Discharge: 2016-06-07 | Disposition: A | Discharge status: Home / Self Care | Payer: Commercial Managed Care - HMO | Attending: Emergency Medicine | Admitting: Emergency Medicine

## 2016-06-07 ENCOUNTER — Encounter (HOSPITAL_COMMUNITY): Payer: Self-pay | Admitting: Emergency Medicine

## 2016-06-07 DIAGNOSIS — M79662 Pain in left lower leg: Secondary | ICD-10-CM | POA: Insufficient documentation

## 2016-06-07 DIAGNOSIS — E119 Type 2 diabetes mellitus without complications: Secondary | ICD-10-CM | POA: Insufficient documentation

## 2016-06-07 DIAGNOSIS — E785 Hyperlipidemia, unspecified: Secondary | ICD-10-CM | POA: Insufficient documentation

## 2016-06-07 DIAGNOSIS — I10 Essential (primary) hypertension: Secondary | ICD-10-CM | POA: Insufficient documentation

## 2016-06-07 DIAGNOSIS — Z85118 Personal history of other malignant neoplasm of bronchus and lung: Secondary | ICD-10-CM | POA: Diagnosis not present

## 2016-06-07 DIAGNOSIS — Z79899 Other long term (current) drug therapy: Secondary | ICD-10-CM | POA: Insufficient documentation

## 2016-06-07 DIAGNOSIS — R079 Chest pain, unspecified: Secondary | ICD-10-CM | POA: Diagnosis not present

## 2016-06-07 DIAGNOSIS — R0602 Shortness of breath: Secondary | ICD-10-CM | POA: Diagnosis not present

## 2016-06-07 DIAGNOSIS — Z7902 Long term (current) use of antithrombotics/antiplatelets: Secondary | ICD-10-CM | POA: Insufficient documentation

## 2016-06-07 DIAGNOSIS — R0789 Other chest pain: Secondary | ICD-10-CM | POA: Insufficient documentation

## 2016-06-07 DIAGNOSIS — Z87891 Personal history of nicotine dependence: Secondary | ICD-10-CM | POA: Insufficient documentation

## 2016-06-07 DIAGNOSIS — M79609 Pain in unspecified limb: Secondary | ICD-10-CM

## 2016-06-07 DIAGNOSIS — I2510 Atherosclerotic heart disease of native coronary artery without angina pectoris: Secondary | ICD-10-CM | POA: Diagnosis not present

## 2016-06-07 DIAGNOSIS — F329 Major depressive disorder, single episode, unspecified: Secondary | ICD-10-CM | POA: Diagnosis not present

## 2016-06-07 DIAGNOSIS — J449 Chronic obstructive pulmonary disease, unspecified: Secondary | ICD-10-CM | POA: Insufficient documentation

## 2016-06-07 DIAGNOSIS — Z7982 Long term (current) use of aspirin: Secondary | ICD-10-CM | POA: Diagnosis not present

## 2016-06-07 DIAGNOSIS — Z7984 Long term (current) use of oral hypoglycemic drugs: Secondary | ICD-10-CM | POA: Insufficient documentation

## 2016-06-07 LAB — CBC
HCT: 40.4 % (ref 39.0–52.0)
HEMOGLOBIN: 12.8 g/dL — AB (ref 13.0–17.0)
MCH: 29.1 pg (ref 26.0–34.0)
MCHC: 31.7 g/dL (ref 30.0–36.0)
MCV: 91.8 fL (ref 78.0–100.0)
Platelets: 169 10*3/uL (ref 150–400)
RBC: 4.4 MIL/uL (ref 4.22–5.81)
RDW: 16.6 % — ABNORMAL HIGH (ref 11.5–15.5)
WBC: 14.3 10*3/uL — ABNORMAL HIGH (ref 4.0–10.5)

## 2016-06-07 LAB — BASIC METABOLIC PANEL
ANION GAP: 11 (ref 5–15)
BUN: 11 mg/dL (ref 6–20)
CHLORIDE: 94 mmol/L — AB (ref 101–111)
CO2: 28 mmol/L (ref 22–32)
Calcium: 8.8 mg/dL — ABNORMAL LOW (ref 8.9–10.3)
Creatinine, Ser: 1.36 mg/dL — ABNORMAL HIGH (ref 0.61–1.24)
GFR calc non Af Amer: 55 mL/min — ABNORMAL LOW (ref >60)
GLUCOSE: 185 mg/dL — AB (ref 65–99)
Potassium: 3.3 mmol/L — ABNORMAL LOW (ref 3.5–5.1)
Sodium: 133 mmol/L — ABNORMAL LOW (ref 135–145)

## 2016-06-07 LAB — I-STAT TROPONIN, ED: Troponin i, poc: 0 ng/mL (ref 0.00–0.08)

## 2016-06-07 MED ORDER — IOPAMIDOL (ISOVUE-370) INJECTION 76%
100.0000 mL | Freq: Once | INTRAVENOUS | Status: AC | PRN
  Administered 2016-06-07: 100 mL via INTRAVENOUS

## 2016-06-07 MED ORDER — SODIUM CHLORIDE 0.9 % IV BOLUS (SEPSIS)
500.0000 mL | Freq: Once | INTRAVENOUS | Status: AC
  Administered 2016-06-07: 500 mL via INTRAVENOUS

## 2016-06-07 NOTE — ED Notes (Signed)
Pt oxygen saturation of 89% placed on 2L [place] oxygen saturation now at 92%. Pt states hx of COPD and emphysema and is on 2L [place] at home.

## 2016-06-07 NOTE — Discharge Instructions (Signed)
There is no evidence of blood clot on today's studies. It does appear that your lung cancer has become larger - please follow up with [name] for further treatment options.    Lung Cancer Lung cancer occurs when abnormal cells in the lung grow out of control and form a mass (tumor). There are several types of lung cancer. The two most common types are:  Non-small cell. In this type of lung cancer, abnormal cells are larger and grow more slowly than those of small cell lung cancer.  Small cell. In this type of lung cancer, abnormal cells are smaller than those of non-small cell lung cancer. Small cell lung cancer gets worse faster than non-small cell lung cancer. CAUSES  The leading cause of lung cancer is smoking tobacco. The second leading cause is radon exposure. RISK FACTORS  Smoking tobacco.  Exposure to secondhand tobacco smoke.  Exposure to radon gas.  Exposure to asbestos.  Exposure to arsenic in drinking water.  Air pollution.  Family or personal history of lung cancer.  Lung radiation therapy.  Being older than 19 years. SIGNS AND SYMPTOMS  In the early stages, symptoms may not be present. As the cancer progresses, symptoms may include:  A lasting cough, possibly with blood.  Fatigue.  Unexplained weight loss.  Shortness of breath.  Wheezing.  Chest pain.  Loss of appetite. Symptoms of advanced lung cancer include:  Hoarseness.  Bone or joint pain.  Weakness.  Nail problems.  Face or arm swelling.  Paralysis of the face.  Drooping eyelids. DIAGNOSIS  Lung cancer can be identified with a physical exam and with tests such as:  A chest X-ray.  A CT scan.  Blood tests.  A biopsy. After a diagnosis is made, you will have more tests to determine the stage of the cancer. The stages of non-small cell lung cancer are:  Stage 0, also called carcinoma in situ. At this stage, abnormal cells are found in the inner lining of your lung or  lungs.  Stage I. At this stage, abnormal cells have grown into a tumor that is no larger than 5 cm across. The cancer has entered the deeper lung tissue but has not yet entered the lymph nodes or other parts of the body.  Stage II. At this stage, the tumor is 7 cm across or smaller and has entered nearby lymph nodes. Or, the tumor is 5 cm across or smaller and has invaded surrounding tissue but is not found in nearby lymph nodes. There may be more than one tumor present.  Stage III. At this stage, the tumor may be any size. There may be more than one tumor in the lungs. The cancer cells have spread to the lymph nodes and possibly to other organs.  Stage IV. At this stage, there are tumors in both lungs and the cancer has spread to other areas of the body. The stages of small cell lung cancer are:  Limited. At this stage, the cancer is found only on one side of the chest.  Extensive. At this stage, the cancer is in the lungs and in tissues on the other side of the chest. The cancer has spread to other organs or is found in the fluid between the layers of your lungs. TREATMENT  Depending on the type and stage of your lung cancer, you may be treated with:  Surgery. This is done to remove a tumor.  Radiation therapy. This treatment destroys cancer cells using X-rays or other  types of radiation.  Chemotherapy. This treatment uses medicines to destroy cancer cells.  Targeted therapy. This treatment aims to destroy only cancer cells instead of all cells as other therapies do. You may also have a combination of treatments. HOME CARE INSTRUCTIONS   Do not use any tobacco products. This includes cigarettes, chewing tobacco, and electronic cigarettes. If you need help quitting, ask your health care provider.  Take medicines only as directed by your health care provider.  Eat a healthy diet. Work with a dietitian to make sure you are getting the nutrition you need.  Consider joining a support  group or seeking counseling to help you cope with the stress of having lung cancer.  Let your cancer specialist (oncologist) know if you are admitted to the hospital.  Keep all follow-up visits as directed by your health care provider. This is important. SEEK MEDICAL CARE IF:   You lose weight without trying.  You have a persistent cough and wheezing.  You feel short of breath.  You tire easily.  You experience bone or joint pain.  You have difficulty swallowing.  You feel hoarse or notice your voice changing.  Your pain medicine is not helping. SEEK IMMEDIATE MEDICAL CARE IF:   You cough up blood.  You have new breathing problems.  You develop chest pain.  You develop swelling in:  One or both ankles or legs.  Your face, neck, or arms.  You are confused.  You experience paralysis in your face or a drooping eyelid.   This information is not intended to replace advice given to you by your health care provider. Make sure you discuss any questions you have with your health care provider.   Document Released: 02/17/2001 Document Revised: 08/02/2015 Document Reviewed: 03/17/2014 Elsevier Interactive Patient Education [employer].

## 2016-06-07 NOTE — Patient Outreach (Signed)
[facility]) Care Management  06/07/2016  Jerre Vandrunen Toelle [birth year] [id number]  Care coordination call placed to [facility]/ [place] at [phone]; spoke with Margie to place referral to contact patient about possible resource options for patient and his family.  Made Margie aware that patient is currently in the ED.  Margie confirmed that she would reach out to patient to complete referral assessment and would follow up with [place] once she has spoken with [place].  [name], RN, BSN, [employer] [facility] Care Management  [phone]

## 2016-06-07 NOTE — ED Notes (Addendum)
Pt c/o right sided chest pain, SOB and nausea for 2 days. Pt is a cancer pt and states last treatment was Tuesday of last week. Pt c/o left calf numbness onset of Monday.

## 2016-06-07 NOTE — Progress Notes (Signed)
VASCULAR LAB PRELIMINARY  PRELIMINARY  PRELIMINARY  PRELIMINARY   Left lower extremity venous duplex has been completed.      Left:  No evidence of DVT, superficial thrombosis, or Baker's cyst.  However the patient has sluggish blood flow in both common femorals, and the left popliteal vein. The areas showed good compressions.  Gave results to the patient's nurse'@11'$ :31 AM.  Janifer Adie, RVT, RDMS 06/07/2016, 11:50 AM

## 2016-06-07 NOTE — ED Provider Notes (Signed)
CSN: [id number]     Arrival date & time 06/07/16  0844 History   First MD Initiated Contact with Patient 06/07/16 0920     Chief Complaint  Patient presents with  . Chest Pain     Patient is a 60 y.o. male presenting with chest pain. The history is provided by the patient. No language interpreter was used.  Chest Pain  Ernest [name] is a 60 y.o. male who presents to the Emergency Department complaining of chest pain.  He has a history of metastatic non-small cell lung cancer. He presents to the emergency department today for evaluation of left calf pain and right-sided chest pain. The Pain started 4 days ago and is described as a cramping sensation in the left posterior calf. He has some mild swelling in that area. He also reports 2 days of increasing right-sided chest pain described as a discomfort type and pressure type sensation.  He has chronic shortness of breath and cough and these are at his baseline. He denies any fevers, vomiting, abdominal pain. He is followed by oncology for his cancer and is currently receiving chemotherapy. He has no history of blood clots but does have a history of peripheral vascular disease with stenting and takes Plavix daily.   Past Medical History  Diagnosis Date  . CAD (coronary artery disease)     Left Main 30% stenosis, LAD 20 - 30 % stenosis, first and second diagonal branchesat 40 - 50%  stenosis with small arteries, circumflex had 30% stenosis in the large obtuse marginal, RCA at 70 - 80%  stenosis [not felt to be occlusive after evaluation with flow wire], distal 50 - 60% stenosis - [name][  . COPD (chronic obstructive pulmonary disease) (HCC)     Dr. Baird Lyons  . Depression   . Anxiety   . Hyperlipidemia   . Chronic insomnia   . Gout   . GERD (gastroesophageal reflux disease)   . PVD (peripheral vascular disease) (HCC)     PTA/Stent right common iliac  . DDD (degenerative disc disease)   . Hx of colonoscopy   . COPD with  asthma ([place]) 09/08/2007  . OSA (obstructive sleep apnea)     NPSG 09/10/10- AHI 11.3/hr  . Hypertension     dr Percival Spanish  . History of radiation therapy 11/10/13- 12/29/13    left lung 6600 cGy in 33 sessions  . Diabetes mellitus without complication ([place]) 03/03/8118  . Cancer ([place])     lung  . Lung cancer ([place]) 10/04/13    LUL squamous cell lung cancer  . History of cardiovascular stress test     Myoview 7/16:  Diaphragmatic attenuation, no ischemia, EF 56%; Low Risk  . Itching due to drug 03/19/2016  . Nausea with vomiting 03/26/2016   Past Surgical History  Procedure Laterality Date  . Spinal fusion  03/05/2007    L4-L5  . Hip surgery      left 'bone graft taken"  . Arm surgery      left elbow  . Shoulder surgery      right  . C-spine surgery    . Angioplasty    . Video bronchoscopy with endobronchial navigation N/A 10/04/2013    Procedure: VIDEO BRONCHOSCOPY WITH ENDOBRONCHIAL NAVIGATION;  Surgeon: Collene Gobble, MD;  Location: [facility];  Service: Thoracic;  Laterality: N/A;  . Back surgery      lower  . Anterior fusion cervical spine      cervical fusion  7  yrs ago ([facility])  . Colon surgery      '11 "Diverticulitis"  . Colonoscopy with propofol N/A 05/22/2015    Procedure: COLONOSCOPY WITH PROPOFOL;  Surgeon: Inda Castle, MD;  Location: [facility];  Service: Endoscopy;  Laterality: N/A;  . Peripheral vascular catheterization N/A 08/10/2015    Procedure: Lower Extremity Angiography;  Surgeon: Lorretta Harp, MD; Distal Ao OK, L-EIA stent OK, R-CIA 100% s/p overlapping 7 mm x 38 mm ICast stents, 50-60% R-CFA       Family History  Problem Relation Age of Onset  . Heart attack Mother   . Heart attack Sister   . Lung cancer Sister   . Cancer Sister     small cell lung, mets to brain  . Emphysema Sister   . Hypertension Brother   . Stroke Neg Hx    Social History  Substance Use Topics  . Smoking status: Former Smoker -- 0.00 packs/day for 43 years    Types: Cigarettes     Quit date: 09/23/2013  . Smokeless tobacco: Never Used     Comment: history of 3 PPD, 11/02/13   . Alcohol Use: No    Review of Systems  Cardiovascular: Positive for chest pain.  All other systems reviewed and are negative.     Allergies  Carboplatin  Home Medications   Prior to Admission medications   Medication Sig Start Date End Date Taking? Authorizing Provider  ADVAIR DISKUS 500-50 MCG/DOSE AEPB 1 PUFF THEN RINSE MOUTH, TWICE DAILY MAINTENANCE 04/26/16  Yes Deneise Lever, MD  albuterol (PROVENTIL) (2.5 MG/3ML) 0.083% nebulizer solution INHALE 1 VIAL IN NEBULIZER EVERY 4 HOURS AS NEEDED FOR WHEEZING OR SHORTNESS OF BREATH 04/04/16  Yes Deneise Lever, MD  ARIPiprazole (ABILIFY) 2 MG tablet Take 2 mg by mouth at bedtime.  05/22/16  Yes Historical Provider, MD  Armodafinil 250 MG tablet Take 250 mg by mouth every morning. 03/08/16  Yes Historical Provider, MD  aspirin 81 MG EC tablet TAKE 1 TABLET BY MOUTH EVERY DAY Patient taking differently: TAKE 81 MG BY MOUTH EVERY DAY 01/04/16  Yes Janith Lima, MD  atorvastatin (LIPITOR) 20 MG tablet TAKE 1 TABLET EVERY DAY Patient taking differently: TAKE 20 MG  EVERY DAY IN THE EVENING 08/01/15  Yes Dorena Cookey, MD  BELSOMRA 20 MG TABS Take 20 mg by mouth at bedtime as needed (for sleep). Reported on 03/29/2016 12/26/15  Yes Historical Provider, MD  clonazePAM (KLONOPIN) 1 MG tablet Take 2 mg by mouth at bedtime as needed for anxiety. Reported on 01/16/2016   Yes Historical Provider, MD  clopidogrel (PLAVIX) 75 MG tablet Take 1 tablet (75 mg total) by mouth daily. 08/11/15  Yes Almyra Deforest, PA  dextromethorphan-guaiFENesin (MUCINEX DM) 30-600 MG 12hr tablet Take 1 tablet by mouth 2 (two) times daily as needed for cough (congestion).   Yes Historical Provider, MD  esomeprazole (NEXIUM) 40 MG capsule TAKE ONE CAPSULE BY MOUTH EVERY DAY Patient taking differently: TAKE 40 MG  BY MOUTH EVERY DAY 05/06/16  Yes Janith Lima, MD  feeding supplement,  ENSURE ENLIVE, (ENSURE ENLIVE) LIQD Take 237 mLs by mouth 2 (two) times daily between meals. 05/23/16  Yes Nishant Dhungel, MD  FLUoxetine (PROZAC) 40 MG capsule Take 40 mg by mouth every morning.   Yes Historical Provider, MD  gabapentin (NEURONTIN) 100 MG capsule Take 100 mg by mouth daily.  07/15/15  Yes Historical Provider, MD  glipiZIDE (GLUCOTROL) 5 MG tablet Take 0.5 tablets (  2.5 mg total) by mouth daily before breakfast. 05/07/16  Yes Janith Lima, MD  isosorbide mononitrate (IMDUR) 120 MG 24 hr tablet Take 120 mg by mouth daily. 04/26/16  Yes Historical Provider, MD  mirtazapine (REMERON) 45 MG tablet Take 1 tablet (45 mg total) by mouth at bedtime. 12/29/14  Yes Tammy S Parrett, NP  morphine (MS CONTIN) 30 MG 12 hr tablet Take 1 tablet (30 mg total) by mouth every 12 (twelve) hours. 06/05/16  Yes Biagio Borg, MD  morphine (MSIR) 15 MG tablet Take 1 tablet (15 mg total) by mouth every 4 (four) hours as needed for severe pain. 06/04/16  Yes Hoyt Koch, MD  nitroGLYCERIN (NITROSTAT) 0.4 MG SL tablet Place 1 tablet (0.4 mg total) under the tongue every 5 (five) minutes as needed for chest pain. 08/09/14  Yes Minus Breeding, MD  ondansetron (ZOFRAN) 8 MG tablet TAKE 1 TABLET (8 MG TOTAL) BY MOUTH EVERY 8 (EIGHT) HOURS AS NEEDED FOR NAUSEA OR VOMITING. 04/11/16  Yes Curt Bears, MD  OXYGEN Inhale 2 L into the lungs daily.    Yes Historical Provider, MD  potassium chloride SA (K-DUR,KLOR-CON) 20 MEQ tablet Take 1 tablet (20 mEq total) by mouth every morning. Reported on 12/07/2015 12/12/15  Yes Janith Lima, MD  prochlorperazine (COMPAZINE) 10 MG tablet TAKE 1 TABLET EVERY 6 HOURS AS NEEDED FOR NAUSEA AND VOMITING Patient taking differently: TAKE 10 MG EVERY 6 HOURS AS NEEDED FOR NAUSEA AND VOMITING 04/11/16  Yes Curt Bears, MD  theophylline (UNIPHYL) 400 MG 24 hr tablet TAKE 1 TABLET EVERY DAY Patient taking differently: TAKE 400 MG BY MOUTH DAILY 04/01/16  Yes Deneise Lever, MD   Tiotropium Bromide Monohydrate (SPIRIVA RESPIMAT) 1.25 MCG/ACT AERS Inhale 2 puffs into the lungs daily. 04/16/16  Yes Janith Lima, MD  VENTOLIN HFA 108 (90 Base) MCG/ACT inhaler INHALE 2 PUFFS INTO THE LUNGS 4 TIMES DAILY AS NEEDED FOR WHEEZING 06/03/16  Yes Deneise Lever, MD  FLORA-Q [facility]) CAPS capsule Take 1 capsule by mouth daily. Patient not taking: Reported on 06/06/2016 04/25/16   Silver Huguenin Elgergawy, MD   BP 108/60 mmHg  Pulse 84  Temp(Src) 98.9 F (37.2 C) (Oral)  Resp 18  SpO2 96% Physical Exam  Constitutional: He is oriented to person, place, and time. He appears well-developed and well-nourished.  HENT:  Head: Normocephalic and atraumatic.  Cardiovascular: Normal rate and regular rhythm.   No murmur heard. Pulmonary/Chest: Effort normal. No respiratory distress.  Rhonchi in right lung base  Abdominal: Soft. There is no tenderness. There is no rebound and no guarding.  Musculoskeletal:  Mild tenderness to left calf, 2+ DP pulses  Neurological: He is alert and oriented to person, place, and time.  Skin: Skin is warm and dry.  Psychiatric: He has a normal mood and affect. His behavior is normal.  Nursing note and vitals reviewed.   ED Course  Procedures (including critical care time) Labs Review Labs Reviewed  BASIC METABOLIC PANEL - Abnormal; Notable for the following:    Sodium 133 (*)    Potassium 3.3 (*)    Chloride 94 (*)    Glucose, Bld 185 (*)    Creatinine, Ser 1.36 (*)    Calcium 8.8 (*)    GFR calc non Af Amer 55 (*)    All other components within normal limits  CBC - Abnormal; Notable for the following:    WBC 14.3 (*)    Hemoglobin 12.8 (*)  RDW 16.6 (*)    All other components within normal limits  Randolm Idol, ED    Imaging Review Dg Chest 2 View  06/07/2016  CLINICAL DATA:  Right-sided chest pain with shortness of breath. History of lung carcinoma EXAM: CHEST  2 VIEW COMPARISON:  Chest CT Apr 22, 2016; May 19, 2016 chest  radiograph FINDINGS: There is a mass in the posterior segment of the right lower lobe measuring 7.7 x 7.6 x 6.3 cm, increased in size. There is airspace consolidation in the medial segment of the right middle lobe. There is stable consolidation with volume loss in the left apex. Heart size is normal. Pulmonary vascularity is normal on the right with some distortion superiorly on the left. No adenopathy is demonstrable by radiography. There is postoperative change in the lower cervical spine. No blastic or bone lesions are evident. IMPRESSION: Enlarging posterior segment right lower lobe mass. Areas of airspace consolidation in the right middle lobe and left apex. Stable cardiac silhouette. Electronically Signed   By: Lowella Grip III M.D.   On: 06/07/2016 09:30   Ct Angio Chest Pe W/cm &/or Wo Cm  06/07/2016  CLINICAL DATA:  Right-sided chest pain, shortness of breath and nausea. EXAM: CT ANGIOGRAPHY CHEST WITH CONTRAST TECHNIQUE: Multidetector CT imaging of the chest was performed using the standard protocol during bolus administration of intravenous contrast. Multiplanar CT image reconstructions and MIPs were obtained to evaluate the vascular anatomy. CONTRAST:  100 cc of Isovue 370 COMPARISON:  04/22/2016 FINDINGS: Mediastinum/Lymph Nodes: The heart size appears mildly enlarged. There is aortic atherosclerosis noted. Calcification involving the RCA, LAD and left circumflex coronary artery is identified. The main pulmonary artery is patent. No saddle embolus. No lobar or segmental pulmonary artery filling defects identified. There is a right paratracheal lymph node which measures 2 cm, image 47 of series 4. On the previous exam this had a short axis of 8 mm. Sub- carinal lymph node measures 1.9 cm, image number 62 of series 4. New from previous exam. Posterior mediastinal node adjacent to the esophagus on the right measures 1.5 cm, image 79 of series 4. Previously 1.2 cm. There is no axillary or  supraclavicular adenopathy. Lungs/Pleura: Similar appearance of right pleural effusion. Mass within the right lower lobe measures 5.2 x 6.9 cm, image 91 of series 4. On the previous exam this measured 2.6 x 2.7 cm. There is airspace consolidation within the right middle lobe, similar to previous study. Subpleural nodule within the left lower lobe is unchanged measuring 6 mm, image 89 of series 5. Similar appearance of consolidation and cavitation involving the left upper lobe. Upper abdomen: Low-attenuation structure within the spleen measures 5.1 cm, image 148 of series 4. Not significantly changed from previous exam. No suspicious liver abnormality. The adrenal glands are normal. The visualized portions of the pancreas are normal. Musculoskeletal: No chest wall mass or suspicious bone lesions identified. Review of the MIP images confirms the above findings. IMPRESSION: 1. There is no evidence for acute pulmonary embolus. 2. Interval progression of mediastinal adenopathy. 3. Interval increase in size of right lung mass. Electronically Signed   By: Kerby Moors M.D.   On: 06/07/2016 15:02   I have personally reviewed and evaluated these images and lab results as part of my medical decision-making.   EKG Interpretation   Date/Time:  Friday June 07 2016 09:13:19 EDT Ventricular Rate:  107 PR Interval:  144 QRS Duration: 76 QT Interval:  334 QTC Calculation: 445 R Axis:  100 Text Interpretation:  Sinus tachycardia Rightward axis Nonspecific ST  abnormality Abnormal ECG Artifact Confirmed by Hazle Coca [phone]) on  06/07/2016 9:21:21 AM      MDM   Final diagnoses:  Other chest pain  Pain of left calf    Pt with hx/o lung cancer here with left calf pain, chest pain for several days.  Vascular US negative for DVT and CT chest negative for PE.  He does have progression of his known lung cancer, no evidence of pna.  Discussed with pt findings of studies.  Pt is stable in the ED and on his home  oxygen settings.  Plan to d/c home with Oncology follow up. Discussed outpatient follow up and return precautions.  Presentation is not c/w ACS.      Quintella Reichert, MD 06/08/16 [phone]

## 2016-06-07 NOTE — ED Notes (Signed)
Pt transported to CT ?

## 2016-06-10 ENCOUNTER — Telehealth: Payer: Self-pay

## 2016-06-10 ENCOUNTER — Inpatient Hospital Stay: Payer: Commercial Managed Care - HMO | Admitting: Internal Medicine

## 2016-06-10 NOTE — Telephone Encounter (Signed)
Tried to call pt about missed appt on 06/10/2016. No answer. Lmom.

## 2016-06-11 ENCOUNTER — Ambulatory Visit (HOSPITAL_BASED_OUTPATIENT_CLINIC_OR_DEPARTMENT_OTHER): Payer: Commercial Managed Care - HMO | Admitting: Internal Medicine

## 2016-06-11 ENCOUNTER — Ambulatory Visit: Payer: Commercial Managed Care - HMO

## 2016-06-11 ENCOUNTER — Other Ambulatory Visit (HOSPITAL_BASED_OUTPATIENT_CLINIC_OR_DEPARTMENT_OTHER): Payer: Commercial Managed Care - HMO

## 2016-06-11 ENCOUNTER — Encounter: Payer: Self-pay | Admitting: Internal Medicine

## 2016-06-11 ENCOUNTER — Telehealth: Payer: Self-pay | Admitting: Internal Medicine

## 2016-06-11 VITALS — BP 135/69 | HR 90 | Temp 99.3°F | Resp 19 | Ht 63.0 in | Wt 182.5 lb

## 2016-06-11 DIAGNOSIS — C3491 Malignant neoplasm of unspecified part of right bronchus or lung: Secondary | ICD-10-CM | POA: Diagnosis not present

## 2016-06-11 DIAGNOSIS — C7801 Secondary malignant neoplasm of right lung: Secondary | ICD-10-CM | POA: Diagnosis not present

## 2016-06-11 DIAGNOSIS — Z5112 Encounter for antineoplastic immunotherapy: Secondary | ICD-10-CM

## 2016-06-11 LAB — CBC WITH DIFFERENTIAL/PLATELET
BASO%: 0.1 % (ref 0.0–2.0)
BASOS ABS: 0 10*3/uL (ref 0.0–0.1)
EOS ABS: 0.3 10*3/uL (ref 0.0–0.5)
EOS%: 4 % (ref 0.0–7.0)
HCT: 41.7 % (ref 38.4–49.9)
HEMOGLOBIN: 13.3 g/dL (ref 13.0–17.1)
LYMPH#: 1 10*3/uL (ref 0.9–3.3)
LYMPH%: 12.1 % — ABNORMAL LOW (ref 14.0–49.0)
MCH: 29 pg (ref 27.2–33.4)
MCHC: 31.9 g/dL — ABNORMAL LOW (ref 32.0–36.0)
MCV: 91 fL (ref 79.3–98.0)
MONO#: 0.5 10*3/uL (ref 0.1–0.9)
MONO%: 6 % (ref 0.0–14.0)
NEUT%: 77.8 % — ABNORMAL HIGH (ref 39.0–75.0)
NEUTROS ABS: 6.7 10*3/uL — AB (ref 1.5–6.5)
NRBC: 0 % (ref 0–0)
Platelets: 224 10*3/uL (ref 140–400)
RBC: 4.58 10*6/uL (ref 4.20–5.82)
RDW: 16.3 % — AB (ref 11.0–14.6)
WBC: 8.6 10*3/uL (ref 4.0–10.3)

## 2016-06-11 LAB — COMPREHENSIVE METABOLIC PANEL
ALBUMIN: 3.1 g/dL — AB (ref 3.5–5.0)
ALK PHOS: 105 U/L (ref 40–150)
ALT: 11 U/L (ref 0–55)
AST: 14 U/L (ref 5–34)
Anion Gap: 12 mEq/L — ABNORMAL HIGH (ref 3–11)
BILIRUBIN TOTAL: 0.49 mg/dL (ref 0.20–1.20)
BUN: 8.1 mg/dL (ref 7.0–26.0)
CO2: 30 meq/L — AB (ref 22–29)
CREATININE: 1 mg/dL (ref 0.7–1.3)
Calcium: 10.1 mg/dL (ref 8.4–10.4)
Chloride: 96 mEq/L — ABNORMAL LOW (ref 98–109)
EGFR: 80 mL/min/{1.73_m2} — AB (ref >90)
GLUCOSE: 114 mg/dL (ref 70–140)
Potassium: 4 mEq/L (ref 3.5–5.1)
SODIUM: 138 meq/L (ref 136–145)
TOTAL PROTEIN: 7.1 g/dL (ref 6.4–8.3)

## 2016-06-11 NOTE — Telephone Encounter (Signed)
Call him to reschedule

## 2016-06-11 NOTE — Progress Notes (Signed)
[place] [phone]   [phone]  OFFICE PROGRESS NOTE  [name], MD [address] [facility] [address] [place] [address]  DIAGNOSIS: Metastatic non-small cell lung cancer, poorly differentiated carcinoma initially diagnosed as Stage IIIA (T3, N2, M0) non-small cell lung cancer consistent with squamous cell carcinoma involving the left suprahilar mass with mediastinal lymphadenopathy diagnosed in November of 2014. The patient has recurrence in February 2017. PDL 1 expression 0%.  PRIOR THERAPY:  1) Concurrent chemoradiation with weekly carboplatin for AUC of 2 and paclitaxel 45 mg/M2, status post 7 cycles, last dose was given 12/20/2013. First dose on 11/01/2013. 2) Consolidation chemotherapy with carboplatin for AUC of 5 and paclitaxel 175 mg/M2 every 3 weeks with Neulasta support. First cycle on 02/08/2014. Status post 3 cycles and carboplatin was discontinued secondary to allergic reaction. 3) Abraxane 100 MG/M2 on days 1 and 8 every 3 weeks. Status post 3 cycles. Last dose was given 04/16/2016 discontinued secondary to disease progression.   CURRENT THERAPY: Nivolumab 240 MG IV every 2 weeks. First dose 05/27/2016. Status post one cycle.  CHEMOTHERAPY INTENT: Curative/control  CURRENT # OF CHEMOTHERAPY CYCLES: 1  CURRENT ANTIEMETICS: Zofran, dexamethasone and Compazine  CURRENT SMOKING STATUS: Former smoker  ORAL CHEMOTHERAPY AND CONSENT: None  CURRENT BISPHOSPHONATES USE: None  PAIN MANAGEMENT: 0/10  NARCOTICS INDUCED CONSTIPATION: None.  LIVING WILL AND CODE STATUS: Full code.   INTERVAL HISTORY: Ernest [name] 60 y.o. male returns to the clinic today for follow-up visit accompanied by his wife. The patient continues to have shortness of breath with exertion secondary to COPD. he was seen recently at the emergency department at [facility] on 06/07/2016 and repeat CT angiogram of the chest showed no evidence  for pulmonary embolism but there was evidence for significant disease progression. He tolerated the first cycle of his treatment with Nivolumab fairly well. He denied having any significant chest pain or hemoptysis. He denied having any nausea or vomiting, no fever or chills. He is here today for evaluation and discussion of his treatment options.  MEDICAL HISTORY: Past Medical History  Diagnosis Date  . CAD (coronary artery disease)     Left Main 30% stenosis, LAD 20 - 30 % stenosis, first and second diagonal branchesat 40 - 50%  stenosis with small arteries, circumflex had 30% stenosis in the large obtuse marginal, RCA at 70 - 80%  stenosis [not felt to be occlusive after evaluation with flow wire], distal 50 - 60% stenosis - [name][  . COPD (chronic obstructive pulmonary disease) (HCC)     Dr. Baird Lyons  . Depression   . Anxiety   . Hyperlipidemia   . Chronic insomnia   . Gout   . GERD (gastroesophageal reflux disease)   . PVD (peripheral vascular disease) (HCC)     PTA/Stent right common iliac  . DDD (degenerative disc disease)   . Hx of colonoscopy   . COPD with asthma ([place]) 09/08/2007  . OSA (obstructive sleep apnea)     NPSG 09/10/10- AHI 11.3/hr  . Hypertension     dr Percival Spanish  . History of radiation therapy 11/10/13- 12/29/13    left lung 6600 cGy in 33 sessions  . Diabetes mellitus without complication ([place]) 3/54/6568  . Cancer ([name])     lung  . Lung cancer ([name]) 10/04/13    LUL squamous cell lung cancer  . History of cardiovascular stress test     Myoview 7/16:  Diaphragmatic  attenuation, no ischemia, EF 56%; Low Risk  . Itching due to drug 03/19/2016  . Nausea with vomiting 03/26/2016    ALLERGIES:  is allergic to carboplatin.  MEDICATIONS:  Current Outpatient Prescriptions  Medication Sig Dispense Refill  . ADVAIR DISKUS 500-50 MCG/DOSE AEPB 1 PUFF THEN RINSE MOUTH, TWICE DAILY MAINTENANCE 60 each 2  . albuterol (PROVENTIL) (2.5 MG/3ML) 0.083% nebulizer  solution INHALE 1 VIAL IN NEBULIZER EVERY 4 HOURS AS NEEDED FOR WHEEZING OR SHORTNESS OF BREATH 300 mL 2  . ARIPiprazole (ABILIFY) 2 MG tablet Take 2 mg by mouth at bedtime.   4  . Armodafinil 250 MG tablet Take 250 mg by mouth every morning.  5  . aspirin 81 MG EC tablet TAKE 1 TABLET BY MOUTH EVERY DAY (Patient taking differently: TAKE 81 MG BY MOUTH EVERY DAY) 30 tablet 5  . atorvastatin (LIPITOR) 20 MG tablet TAKE 1 TABLET EVERY DAY (Patient taking differently: TAKE 20 MG  EVERY DAY IN THE EVENING) 90 tablet 3  . BELSOMRA 20 MG TABS Take 20 mg by mouth at bedtime as needed (for sleep). Reported on 03/29/2016  4  . clonazePAM (KLONOPIN) 1 MG tablet Take 2 mg by mouth at bedtime as needed for anxiety. Reported on 01/16/2016    . clopidogrel (PLAVIX) 75 MG tablet Take 1 tablet (75 mg total) by mouth daily. 90 tablet 3  . dextromethorphan-guaiFENesin (MUCINEX DM) 30-600 MG 12hr tablet Take 1 tablet by mouth 2 (two) times daily as needed for cough (congestion).    [name] esomeprazole (NEXIUM) 40 MG capsule TAKE ONE CAPSULE BY MOUTH EVERY DAY (Patient taking differently: TAKE 40 MG  BY MOUTH EVERY DAY) 90 capsule 3  . feeding supplement, ENSURE ENLIVE, (ENSURE ENLIVE) LIQD Take 237 mLs by mouth 2 (two) times daily between meals. 237 mL 12  . FLORA-Q (FLORA-Q) CAPS capsule Take 1 capsule by mouth daily. (Patient not taking: Reported on 06/06/2016) 20 capsule 0  . FLUoxetine (PROZAC) 40 MG capsule Take 40 mg by mouth every morning.    . gabapentin (NEURONTIN) 100 MG capsule Take 100 mg by mouth daily.     [name] glipiZIDE (GLUCOTROL) 5 MG tablet Take 0.5 tablets (2.5 mg total) by mouth daily before breakfast. 30 tablet 5  . isosorbide mononitrate (IMDUR) 120 MG 24 hr tablet Take 120 mg by mouth daily.  3  . mirtazapine (REMERON) 45 MG tablet Take 1 tablet (45 mg total) by mouth at bedtime.    [name] morphine (MS CONTIN) 30 MG 12 hr tablet Take 1 tablet (30 mg total) by mouth every 12 (twelve) hours. 60 tablet 0  .  morphine (MSIR) 15 MG tablet Take 1 tablet (15 mg total) by mouth every 4 (four) hours as needed for severe pain. 30 tablet 0  . nitroGLYCERIN (NITROSTAT) 0.4 MG SL tablet Place 1 tablet (0.4 mg total) under the tongue every 5 (five) minutes as needed for chest pain. 25 tablet 3  . ondansetron (ZOFRAN) 8 MG tablet TAKE 1 TABLET (8 MG TOTAL) BY MOUTH EVERY 8 (EIGHT) HOURS AS NEEDED FOR NAUSEA OR VOMITING. 20 tablet 0  . OXYGEN Inhale 2 L into the lungs daily.     . potassium chloride SA (K-DUR,KLOR-CON) 20 MEQ tablet Take 1 tablet (20 mEq total) by mouth every morning. Reported on 12/07/2015 90 tablet 1  . prochlorperazine (COMPAZINE) 10 MG tablet TAKE 1 TABLET EVERY 6 HOURS AS NEEDED FOR NAUSEA AND VOMITING (Patient taking differently: TAKE 10 MG EVERY 6 HOURS  AS NEEDED FOR NAUSEA AND VOMITING) 30 tablet 0  . theophylline (UNIPHYL) 400 MG 24 hr tablet TAKE 1 TABLET EVERY DAY (Patient taking differently: TAKE 400 MG BY MOUTH DAILY) 30 tablet 5  . Tiotropium Bromide Monohydrate (SPIRIVA RESPIMAT) 1.25 MCG/ACT AERS Inhale 2 puffs into the lungs daily. 4 g 11  . VENTOLIN HFA 108 (90 Base) MCG/ACT inhaler INHALE 2 PUFFS INTO THE LUNGS 4 TIMES DAILY AS NEEDED FOR WHEEZING 18 Inhaler 2   No current facility-administered medications for this visit.    SURGICAL HISTORY:  Past Surgical History  Procedure Laterality Date  . Spinal fusion  03/05/2007    L4-L5  . Hip surgery      left 'bone graft taken"  . Arm surgery      left elbow  . Shoulder surgery      right  . C-spine surgery    . Angioplasty    . Video bronchoscopy with endobronchial navigation N/A 10/04/2013    Procedure: VIDEO BRONCHOSCOPY WITH ENDOBRONCHIAL NAVIGATION;  Surgeon: Collene Gobble, MD;  Location: [facility];  Service: Thoracic;  Laterality: N/A;  . Back surgery      lower  . Anterior fusion cervical spine      cervical fusion  7 yrs ago ([facility])  . Colon surgery      '11 "Diverticulitis"  . Colonoscopy with propofol N/A 05/22/2015      Procedure: COLONOSCOPY WITH PROPOFOL;  Surgeon: Inda Castle, MD;  Location: [facility];  Service: Endoscopy;  Laterality: N/A;  . Peripheral vascular catheterization N/A 08/10/2015    Procedure: Lower Extremity Angiography;  Surgeon: Lorretta Harp, MD; Distal Ao OK, L-EIA stent OK, R-CIA 100% s/p overlapping 7 mm x 38 mm ICast stents, 50-60% R-CFA        REVIEW OF SYSTEMS:  Constitutional: positive for fatigue Eyes: negative Ears, nose, mouth, throat, and face: negative Respiratory: positive for cough, dyspnea on exertion and wheezing Cardiovascular: negative Gastrointestinal: negative Genitourinary:negative Integument/breast: negative Hematologic/lymphatic: negative Musculoskeletal:negative Neurological: negative Behavioral/Psych: negative Endocrine: negative Allergic/Immunologic: negative   PHYSICAL EXAMINATION: General appearance: alert, cooperative and no distress Head: Normocephalic, without obvious abnormality, atraumatic Neck: no adenopathy, no JVD, supple, symmetrical, trachea midline and thyroid not enlarged, symmetric, no tenderness/mass/nodules Lymph nodes: Cervical, supraclavicular, and axillary nodes normal. Resp: wheezes bilaterally Back: symmetric, no curvature. ROM normal. No CVA tenderness. Cardio: regular rate and rhythm, S1, S2 normal, no murmur, click, rub or gallop GI: soft, non-tender; bowel sounds normal; no masses,  no organomegaly Extremities: extremities normal, atraumatic, no cyanosis or edema Neurologic: Alert and oriented X 3, normal strength and tone. Normal symmetric reflexes. Normal coordination and gait  ECOG PERFORMANCE STATUS: 1 - Symptomatic but completely ambulatory  Blood pressure 135/69, pulse 90, temperature 99.3 F (37.4 C), temperature source Oral, resp. rate 19, height '5\' 3"'$  (1.6 m), weight 182 lb 8 oz (82.781 kg), SpO2 97 %.  LABORATORY DATA: Lab Results  Component Value Date   WBC 8.6 06/11/2016   HGB 13.3 06/11/2016    HCT 41.7 06/11/2016   MCV 91.0 06/11/2016   PLT 224 06/11/2016      Chemistry      Component Value Date/Time   NA 133* 06/07/2016 0915   NA 137 05/27/2016 1034   K 3.3* 06/07/2016 0915   K 4.1 05/27/2016 1034   CL 94* 06/07/2016 0915   CO2 28 06/07/2016 0915   CO2 28 05/27/2016 1034   BUN 11 06/07/2016 0915   BUN 12.3 05/27/2016 1034  CREATININE 1.36* 06/07/2016 0915   CREATININE 0.9 05/27/2016 1034   CREATININE 0.80 08/07/2015 0959      Component Value Date/Time   CALCIUM 8.8* 06/07/2016 0915   CALCIUM 9.7 05/27/2016 1034   ALKPHOS 81 05/27/2016 1034   ALKPHOS 67 05/19/2016 0542   AST 14 05/27/2016 1034   AST 20 05/19/2016 0542   ALT 19 05/27/2016 1034   ALT 17 05/19/2016 0542   BILITOT 0.86 05/27/2016 1034   BILITOT 0.7 05/19/2016 0542       RADIOGRAPHIC STUDIES: Dg Chest 2 View  06/07/2016  CLINICAL DATA:  Right-sided chest pain with shortness of breath. History of lung carcinoma EXAM: CHEST  2 VIEW COMPARISON:  Chest CT Apr 22, 2016; May 19, 2016 chest radiograph FINDINGS: There is a mass in the posterior segment of the right lower lobe measuring 7.7 x 7.6 x 6.3 cm, increased in size. There is airspace consolidation in the medial segment of the right middle lobe. There is stable consolidation with volume loss in the left apex. Heart size is normal. Pulmonary vascularity is normal on the right with some distortion superiorly on the left. No adenopathy is demonstrable by radiography. There is postoperative change in the lower cervical spine. No blastic or bone lesions are evident. IMPRESSION: Enlarging posterior segment right lower lobe mass. Areas of airspace consolidation in the right middle lobe and left apex. Stable cardiac silhouette. Electronically Signed   By: Lowella Grip III M.D.   On: 06/07/2016 09:30   Dg Chest 2 View  05/18/2016  CLINICAL DATA:  Shortness of breath.  Lung cancer. EXAM: CHEST  2 VIEW COMPARISON:  04/21/2016 chest radiograph.  04/22/2016  chest CT . FINDINGS: Surgical hardware from ACDF overlies the lower cervical spine. Stable cardiomediastinal silhouette with normal heart size. No pneumothorax. No pleural effusion. Significant growth of 6.3 cm right lower lobe lung mass. Stable left apical pleural capping and distortion. No pulmonary edema. No acute consolidative airspace disease. IMPRESSION: Significant growth of 6.3 cm right lower lobe lung mass, suggesting either a lung metastasis or metachronous primary lung cancer. Stable post treatment changes in the left upper lung. Electronically Signed   By: Ilona Sorrel M.D.   On: 05/18/2016 11:54   Ct Angio Chest Pe W/cm &/or Wo Cm  06/07/2016  CLINICAL DATA:  Right-sided chest pain, shortness of breath and nausea. EXAM: CT ANGIOGRAPHY CHEST WITH CONTRAST TECHNIQUE: Multidetector CT imaging of the chest was performed using the standard protocol during bolus administration of intravenous contrast. Multiplanar CT image reconstructions and MIPs were obtained to evaluate the vascular anatomy. CONTRAST:  100 cc of Isovue 370 COMPARISON:  04/22/2016 FINDINGS: Mediastinum/Lymph Nodes: The heart size appears mildly enlarged. There is aortic atherosclerosis noted. Calcification involving the RCA, LAD and left circumflex coronary artery is identified. The main pulmonary artery is patent. No saddle embolus. No lobar or segmental pulmonary artery filling defects identified. There is a right paratracheal lymph node which measures 2 cm, image 47 of series 4. On the previous exam this had a short axis of 8 mm. Sub- carinal lymph node measures 1.9 cm, image number 62 of series 4. New from previous exam. Posterior mediastinal node adjacent to the esophagus on the right measures 1.5 cm, image 79 of series 4. Previously 1.2 cm. There is no axillary or supraclavicular adenopathy. Lungs/Pleura: Similar appearance of right pleural effusion. Mass within the right lower lobe measures 5.2 x 6.9 cm, image 91 of series 4. On  the previous exam this measured  2.6 x 2.7 cm. There is airspace consolidation within the right middle lobe, similar to previous study. Subpleural nodule within the left lower lobe is unchanged measuring 6 mm, image 89 of series 5. Similar appearance of consolidation and cavitation involving the left upper lobe. Upper abdomen: Low-attenuation structure within the spleen measures 5.1 cm, image 148 of series 4. Not significantly changed from previous exam. No suspicious liver abnormality. The adrenal glands are normal. The visualized portions of the pancreas are normal. Musculoskeletal: No chest wall mass or suspicious bone lesions identified. Review of the MIP images confirms the above findings. IMPRESSION: 1. There is no evidence for acute pulmonary embolus. 2. Interval progression of mediastinal adenopathy. 3. Interval increase in size of right lung mass. Electronically Signed   By: Kerby Moors M.D.   On: 06/07/2016 15:02   Dg Chest Port 1 View  05/19/2016  CLINICAL DATA:  Healthcare associated pneumonia, hypertension, COPD, diabetes mellitus, former smoker, lung cancer EXAM: PORTABLE CHEST 1 VIEW COMPARISON:  Portable exam 0519 hours compared to 05/18/2016 and correlated with CT chest of 04/22/2016 FINDINGS: Patient's chin obscures the lung apices. Normal heart size, mediastinal contours, and pulmonary vascularity. LEFT apex scarring and volume loss with superior retraction of the LEFT hilum. Improved atelectasis at lower RIGHT lung. Underlying COPD. Again identified mass at inferior RIGHT hemithorax 6.6 x 5.1 cm. No acute infiltrate, pleural effusion, or pneumothorax. IMPRESSION: Persistent RIGHT lower lobe mass. Improved RIGHT basilar atelectasis. COPD changes with chronic scarring at LEFT apex. Electronically Signed   By: Lavonia Dana M.D.   On: 05/19/2016 08:47    ASSESSMENT AND PLAN: This is a very pleasant 60 years old white male with stage IIIA non-small cell lung cancer, squamous cell carcinoma  currently undergoing concurrent chemoradiation with weekly carboplatin and paclitaxel status post 7 weeks of treatment. He tolerated his treatment fairly well with no significant adverse effects. This was followed by consolidation chemotherapy with carboplatin and paclitaxel status post 3 cycles, .carboplatin was discontinued after cycle #2 secondary to hypersensitivity reaction. The recent CT scan of the chest progressive enlargement of the right lower lobe lung nodule concerning for pulmonary metastasis or new synchronous tumor. The recent PET scan showed hypermetabolic activity in the 1.9 cm right lower lobe pulmonary nodule suspicious for metastasis. There was also small mediastinal node metastasis. CT-guided core biopsy of the right lower lobe pulmonary nodule showed poorly differentiated carcinoma. PDL 1 expression was 0% The patient was started on treatment with single agent Abraxane status post 3 cycles but unfortunately restaging CT scan of the chest showed evidence for disease progression. He was then started on treatment with immunotherapy with Nivolumab status post 1 cycle.[name]  Unfortunately the recent CT scan of the chest showed significant disease progression but this is not indicative of his response to the immunotherapy as he received only one cycle. I had a lengthy discussion with the patient and his wife about his current disease status and treatment options. I discussed with the patient and his wife consideration of palliative care and hospice referral at this point especially with the rapid disease progression. I also gave him the option of referral for a second pain at [facility] with Dr. Durenda Hurt. They are interested in this option. Other options for treatment of his condition would be consideration of palliative radiotherapy but the patient had significant COPD and previous radiation fibrosis. I would consider the referral after his appointment with Dr. Durenda Hurt at [facility]. I  will see him back for follow-up  visit in 2 weeks for evaluation and discussion of his treatment options after the second opinion. He was advised to call immediately if he has any concerning symptoms in the interval. The patient voices understanding of current disease status and treatment options and is in agreement with the current care plan.  All questions were answered. The patient knows to call the clinic with any problems, questions or concerns. We can certainly see the patient much sooner if necessary.  Disclaimer: This note was dictated with voice recognition software. Similar sounding words can inadvertently be transcribed and may not be corrected upon review.

## 2016-06-11 NOTE — Telephone Encounter (Signed)
Patient no showed for hospital fu on 7/17.  Please advise.

## 2016-06-11 NOTE — Telephone Encounter (Signed)
FYI:  Wife states that patient does want to reschedule appointment but he just got back from the cancer center and did not get good news.  States will call back to reschedule Hospital FU.  I have cancelled appointment from yesterday so there will not be a fee assessed.

## 2016-06-12 ENCOUNTER — Ambulatory Visit: Payer: Self-pay | Admitting: *Deleted

## 2016-06-12 ENCOUNTER — Other Ambulatory Visit: Payer: Self-pay | Admitting: *Deleted

## 2016-06-12 ENCOUNTER — Telehealth: Payer: Self-pay

## 2016-06-12 NOTE — Telephone Encounter (Signed)
Verbal Orders for palliative care program.   Call Manus Gunning at [phone]

## 2016-06-12 NOTE — Patient Outreach (Signed)
Attempt made to contact pt for transition of care as this RN CM covering for coworker Richarda Osmond (pt's primary nurse case manager).  Follow up on recent in patient stay 6/24-6/29 for acute respiratory failure.  Hx of COPD, non small cell  Lung cancer.    HIPAA compliant voice message left with contact number.  If no response, will try again.    Zara Chess.   [place] [facility]  [phone]

## 2016-06-13 ENCOUNTER — Other Ambulatory Visit: Payer: Self-pay | Admitting: *Deleted

## 2016-06-13 DIAGNOSIS — I2510 Atherosclerotic heart disease of native coronary artery without angina pectoris: Secondary | ICD-10-CM | POA: Diagnosis not present

## 2016-06-13 DIAGNOSIS — J440 Chronic obstructive pulmonary disease with acute lower respiratory infection: Secondary | ICD-10-CM | POA: Diagnosis not present

## 2016-06-13 DIAGNOSIS — C3412 Malignant neoplasm of upper lobe, left bronchus or lung: Secondary | ICD-10-CM | POA: Diagnosis not present

## 2016-06-13 DIAGNOSIS — I10 Essential (primary) hypertension: Secondary | ICD-10-CM | POA: Diagnosis not present

## 2016-06-13 DIAGNOSIS — J9611 Chronic respiratory failure with hypoxia: Secondary | ICD-10-CM | POA: Diagnosis not present

## 2016-06-13 DIAGNOSIS — J45909 Unspecified asthma, uncomplicated: Secondary | ICD-10-CM | POA: Diagnosis not present

## 2016-06-13 DIAGNOSIS — J189 Pneumonia, unspecified organism: Secondary | ICD-10-CM | POA: Diagnosis not present

## 2016-06-13 DIAGNOSIS — E1151 Type 2 diabetes mellitus with diabetic peripheral angiopathy without gangrene: Secondary | ICD-10-CM | POA: Diagnosis not present

## 2016-06-13 DIAGNOSIS — D696 Thrombocytopenia, unspecified: Secondary | ICD-10-CM | POA: Diagnosis not present

## 2016-06-13 NOTE — Telephone Encounter (Signed)
yes

## 2016-06-13 NOTE — Patient Outreach (Signed)
Transition of care call successful as this RN CM covering for Richarda Osmond - pt's primary nurse case manager, follow up on recent in patient stay 6/24-6/29 acute respiratory failure, hx of non small cell lung cancer, mets from left to right lung.  Spoke with pt, HIPAA verified, informed pt- covering for primary nurse case manager.  Pt reports doing okay, discussed  Recent visit with oncologist 7/18: they stopped chemo, informed cancer spread so much cannot do anything else.  Pt reports he is going to go for a second opinion at [facility].  Pt reports breathing is the same, labored. Pt reports HH RN still coming, with him right now. Pt reports leg is better, swelling down.  RN CM discussed primary care nurse to follow up with him next week.    Care plan updated.  Plan to provide update to [facility] by in basket.    Zara Chess.   [facility]  [phone]

## 2016-06-13 NOTE — Telephone Encounter (Signed)
Called Palliative Care to inquire where the request is coming from.  Spoke to [place] and she stated that [facility] is making the request and also stated that pt is a candidate for the program.   Please advise if verbal okay can be given.

## 2016-06-14 ENCOUNTER — Telehealth: Payer: Self-pay | Admitting: Internal Medicine

## 2016-06-14 NOTE — Telephone Encounter (Signed)
PATIENT ALREADY ON SCHEDULE FOR 2 WEEKS (8/1) PER 7/18 POF - ALSO 7/18 TX CXD PER POF. REFERRAL MESSAGE TO HIM RE RFERRAL TO [facility].

## 2016-06-14 NOTE — Telephone Encounter (Signed)
Verbal orders given to [place].

## 2016-06-17 ENCOUNTER — Telehealth: Payer: Self-pay

## 2016-06-17 ENCOUNTER — Telehealth: Payer: Self-pay | Admitting: Internal Medicine

## 2016-06-17 DIAGNOSIS — J449 Chronic obstructive pulmonary disease, unspecified: Secondary | ICD-10-CM | POA: Diagnosis not present

## 2016-06-17 NOTE — Telephone Encounter (Signed)
Called pt left vm in ref to appt with Dr. Durenda Hurt on 06/28/16'@1130'$ .  Faxed path reports to [place].  Katrina will go into care everywhere for the remaining records.

## 2016-06-17 NOTE — Telephone Encounter (Signed)
Wife called wanting to have a short appt with Dr Julien Nordmann. Dr Julien Nordmann was supposed to reach out to [facility] and they have not heard anything so far. They are wanting to know what the plan is. The next appt is 8/1 but they would like sooner if possible.

## 2016-06-18 ENCOUNTER — Ambulatory Visit (INDEPENDENT_AMBULATORY_CARE_PROVIDER_SITE_OTHER): Payer: Commercial Managed Care - HMO | Admitting: Internal Medicine

## 2016-06-18 ENCOUNTER — Encounter: Payer: Self-pay | Admitting: Internal Medicine

## 2016-06-18 VITALS — BP 126/72 | HR 94 | Ht 66.0 in | Wt 182.4 lb

## 2016-06-18 DIAGNOSIS — J9611 Chronic respiratory failure with hypoxia: Secondary | ICD-10-CM

## 2016-06-18 DIAGNOSIS — T753XXA Motion sickness, initial encounter: Secondary | ICD-10-CM | POA: Diagnosis not present

## 2016-06-18 DIAGNOSIS — G894 Chronic pain syndrome: Secondary | ICD-10-CM | POA: Diagnosis not present

## 2016-06-18 DIAGNOSIS — J449 Chronic obstructive pulmonary disease, unspecified: Secondary | ICD-10-CM

## 2016-06-18 MED ORDER — SCOPOLAMINE 1 MG/3DAYS TD PT72: 1.0000 | MEDICATED_PATCH | TRANSDERMAL | Status: DC

## 2016-06-18 NOTE — Assessment & Plan Note (Signed)
Actually near baseline now without exacerbation. We reviewed medications without change.

## 2016-06-18 NOTE — Patient Instructions (Signed)
Script sent for Transderm Scop patches for motion sickness  Try occasional ibuprofen, heating pad, etc for chest pain. Hospice can be a big help with pain control if needed.  Please call as needed

## 2016-06-18 NOTE — Assessment & Plan Note (Signed)
Right chest wall pain. I don't hear rub and chest wall was not tender to pressure but pain may relate to known progressive lung cancer. He has a hospice visit scheduled for this afternoon to establish. Plan-heating pad, occasional ibuprofen, avoiding gastritis which would be complicated by his Plavix. Continue morphine from his oncologist.

## 2016-06-18 NOTE — Progress Notes (Signed)
Subjective:    Patient ID: Ernest [name], male    DOB: [birth year], 60 y.o.   MRN: [id number]  HPI 60 year old male patient with COPD/ bronchitis, OSA /CPAP, oxygen and steroid dependent, chronic respiratory failure.Pt was diagnosed with Squamous cell CA LUL in 2014 and was treated with XRT and Chemotherapy. 12/2015: suspected recurrence of squamous cell lung cancer per CT with enlargement of RIGHT lower lobe nodule which is concerning for a pulmonary metastasis. Follow up and plan of care per oncology. Significant Events: Recent Hospitalization: Admit date: 11/24/2015 Discharge date: 11/30/2015 Discharge Diagnoses:  Principal Problem:  Acute on chronic respiratory failure with hypoxia and hypercapnia (HCC) Active Problems:  Essential hypertension  COPD with asthma ([place])  Diabetes mellitus without complication (HCC)  COPD exacerbation ([place])  Acute respiratory failure with hypercapnia (HCC)  Sepsis due to pneumonia ([place]) Flu Swab : Negative Respiratory viral panel negative Treated with 6 days of IV antibiotic therapy with ceftriaxone and azithromycin/ Prednisone. 01/12/16 CT Chest/ contrast: IMPRESSION: 1. Significant enlargement of RIGHT lower lobe nodule is concerning for a pulmonary metastasis. Recommend tissue sampling with bronchoscopy or percutaneous biopsy. 2. Stable postsurgical and post therapy change in the LEFT upper lobe. 3. Stable small mediastinal lymph nodes. CXR:01/31/16 IMPRESSION: 1. Apparent interval enlargement of the right lower lobe pulmonary nodule consistent with either enlarging pulmonary metastasis or primary lung carcinoma. 2. Stable pleural parenchymal scarring in the left lung apex.  01/31/16:Hospital Follow Up: Patient presents to the office with continued dyspnea. He stated he thought He was going to have to call EMS last night. Chest x-ray today indicates Significant enlargement of right lower lobe pulmonary nodule in comparison to CT scan  done 01/12/2016, but no active infectious process. Patient has appointment with Dr. Earlie Server in oncology tomorrow 02/01/2016 for follow-up regarding treatment of suspected recurrent squamous cell carcinoma. He states he is not coughing up any discolored sputum. What he does cough up is white to clear. He continues to use his Mucinex as he feels like he has chest congestion that is not clearing with cough. He continues to take his maintenance prednisone dose of 5 mg daily. He is using his oxygen at home at 3 L nasal cannula, but has come to the office today without his oxygen. He denies chest pain, orthopnea, or  Hemoptysis.  02/14/2016-60 year old male former smoker with COPD/ bronchitis, OSA /CPAP, oxygen and steroid dependent, chronic respiratory failure.Pt was diagnosed with Squamous cell CA LUL in 2014 and was treated with XRT and Chemotherapy. 12/2015: suspected recurrence of squamous cell lung cancer per CT with enlargement of RIGHT lower lobe nodule which is concerning for a pulmonary metastasis CPAP 10/[employer]-not wearing, made him feel "smothered" O2 2-3 liters/[employer] FOLLOWS FOR: pt c/o unchanged sob with any exertion, prod cough with white mucus, lightneadedness.   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 He now reports increased and deeper cough, head congestion, easier dyspnea on exertion, more frequent need for nebulizer treatments. He left his oxygen in the car to come up for this visit. Orthopnea-sleeps upright in chair  06/18/2016-60 year old male former smoker with COPD/bronchitis, OSA/CPAP, oxygen and steroid dependent, chronic respiratory failure, Pt was diagnosed with Squamous cell CA LUL in 2014 and was treated with XRT and Chemotherapy. 12/2015. O2 [employer] 2-3 L sleep and as needed CPAP 10- can't tolerate FOLLOWS FOR: Pt states he has been doing well; continues to have cough, congestion, SOB and wheezing with exertion. Pt's cancer is growing and will be  sent [facility] to help with  the case.  Nonpleuritic pain right anterior chest  in the last couple of weeks without chest wall tenderness. Wants to go deep sea fishing "bucket list" and asks patches for seasickness. CT chest 06/07/2016 IMPRESSION: 1. There is no evidence for acute pulmonary embolus. 2. Interval progression of mediastinal adenopathy. 3. Interval increase in size of right lung mass. Electronically Signed   By: Kerby Moors M.D.   On: 06/07/2016 15:02  ROS-see HPI   Negative unless "+" Constitutional:    weight loss, night sweats, fevers, chills, fatigue, lassitude. HEENT:    headaches, difficulty swallowing, tooth/dental problems, sore throat,       sneezing, itching, ear ache, nasal congestion, post nasal drip, snoring CV:    + chest pain, orthopnea, PND, swelling in lower extremities, anasarca,                                                     dizziness, palpitations Resp:   + shortness of breath with exertion or at rest.                productive cough,  + non-productive cough, coughing up of blood.              change in color of mucus.  + wheezing.   Skin:    rash or lesions. GI:  No-   heartburn, indigestion, abdominal pain, nausea, vomiting, diarrhea,                 change in bowel habits, loss of appetite GU: dysuria, change in color of urine, no urgency or frequency.   flank pain. MS:   joint pain, stiffness, decreased range of motion, back pain. Neuro-     nothing unusual Psych:  change in mood or affect.  + depression or anxiety.   memory loss.    Objective:  OBJ- Physical Exam General- Alert, Oriented, Affect-appropriate, Distress- none acute, + Obese, on room air Skin- rash-none, lesions- none, excoriation- none, + plethora, Lymphadenopathy- none Head- atraumatic            Eyes- Gross vision intact, PERRLA, conjunctivae and secretions clear            Ears- Hearing, canals-normal            Nose- Clear, no-Septal dev, mucus, polyps, erosion, perforation             Throat-  Mallampati II , mucosa clear , drainage- none, tonsils- atrophic Neck- flexible , trachea midline, no stridor , thyroid nl, carotid no bruit Chest - symmetrical excursion , unlabored           Heart/CV- RRR , no murmur , no gallop  , no rub, nl s1 s2                           - JVD- none , edema- none, stasis changes- none, varices- none           Lung- clear to P&A/Diminished, wheeze- none, cough- none , dullness-none, rub- none, unlabored           Chest wall- not tender to pressure, no rub Abd-  Br/ Gen/ Rectal- Not done, not indicated Extrem- cyanosis- none, clubbing, none, atrophy- none, strength- nl Neuro- grossly intact to  observation    Assessment & Plan:

## 2016-06-18 NOTE — Assessment & Plan Note (Signed)
Wants to go deep sea fishing-bucket list. Plan-Transderm-Scop with discussion

## 2016-06-18 NOTE — Assessment & Plan Note (Signed)
He continues oxygen use with sleep and as needed but was able to come  for this visit on room air, leaving oxygen in car.

## 2016-06-19 ENCOUNTER — Other Ambulatory Visit: Payer: Self-pay | Admitting: *Deleted

## 2016-06-19 ENCOUNTER — Other Ambulatory Visit: Payer: Self-pay | Admitting: Internal Medicine

## 2016-06-19 NOTE — Patient Outreach (Signed)
[facility]) Care Management [facility] CM Telephone Outreach, Transition of Care, day 27 06/19/2016  Malone Vanblarcom [place] [birth year] [phone]  Successful telephone outreach to [name] is an 60 y.o. male followed by [facility] for transition of care after recent inpatient hospital visit, as well as ongoing self management of chronic disease state of COPD. Mr. Kanaan has lung cancer and started a new round of chemotherapy in April 2017. Ernest [name] was recently admitted May 28-April 25, 2016 for increased shortness of breath/ dyspnea; it was determined that he had healthcare associated pneumonia. Unfortunately, Ernest [name] was re-admitted to the hospital June 24-29, 2017, again for COPD exacerbation and HCAP. Ernest [name] is now followed by [facility] for transition of care after most recent hospital discharge on May 24, 2016.  Today, Ernest [name] stated that he was "doing pretty good."  Ernest [name] reported that he attended his pulmonology appointment yesterday and denied questions after the visit, stating that he understood all instructions.  Ernest [name] also updated me that his chemotherapy had been stopped and that he had an upcoming appointment at [facility] for a second opinion on options for treatment of his lung cancer.  Ernest [name] reported that he had visited with the [facility] team yesterday and updated me that they would be coming to visit him regularly from now on; Ernest [name] questioned whether or not I would visit him again, and together we decided that I would make a final home visit for [place] discharge next week.  Ernest [name] denies further concerns, questions, needs, or problems at this time.   Plan:  [place] Discharge visit next week as scheduled.  [name], RN, BSN, [employer] [facility] Care Management  [phone]

## 2016-06-19 NOTE — Telephone Encounter (Signed)
Pt concern resolved 7/24 per Charlie Pitter message.

## 2016-06-20 DIAGNOSIS — D696 Thrombocytopenia, unspecified: Secondary | ICD-10-CM | POA: Diagnosis not present

## 2016-06-20 DIAGNOSIS — J189 Pneumonia, unspecified organism: Secondary | ICD-10-CM | POA: Diagnosis not present

## 2016-06-20 DIAGNOSIS — J45909 Unspecified asthma, uncomplicated: Secondary | ICD-10-CM | POA: Diagnosis not present

## 2016-06-20 DIAGNOSIS — C3412 Malignant neoplasm of upper lobe, left bronchus or lung: Secondary | ICD-10-CM | POA: Diagnosis not present

## 2016-06-20 DIAGNOSIS — I10 Essential (primary) hypertension: Secondary | ICD-10-CM | POA: Diagnosis not present

## 2016-06-20 DIAGNOSIS — J9611 Chronic respiratory failure with hypoxia: Secondary | ICD-10-CM | POA: Diagnosis not present

## 2016-06-20 DIAGNOSIS — E1151 Type 2 diabetes mellitus with diabetic peripheral angiopathy without gangrene: Secondary | ICD-10-CM | POA: Diagnosis not present

## 2016-06-20 DIAGNOSIS — J440 Chronic obstructive pulmonary disease with acute lower respiratory infection: Secondary | ICD-10-CM | POA: Diagnosis not present

## 2016-06-20 DIAGNOSIS — I2510 Atherosclerotic heart disease of native coronary artery without angina pectoris: Secondary | ICD-10-CM | POA: Diagnosis not present

## 2016-06-23 DIAGNOSIS — C342 Malignant neoplasm of middle lobe, bronchus or lung: Secondary | ICD-10-CM | POA: Diagnosis not present

## 2016-06-23 DIAGNOSIS — J449 Chronic obstructive pulmonary disease, unspecified: Secondary | ICD-10-CM | POA: Diagnosis not present

## 2016-06-23 DIAGNOSIS — J9601 Acute respiratory failure with hypoxia: Secondary | ICD-10-CM | POA: Diagnosis not present

## 2016-06-24 ENCOUNTER — Other Ambulatory Visit: Payer: Self-pay | Admitting: Internal Medicine

## 2016-06-24 DIAGNOSIS — F3341 Major depressive disorder, recurrent, in partial remission: Secondary | ICD-10-CM | POA: Diagnosis not present

## 2016-06-25 ENCOUNTER — Telehealth: Payer: Self-pay | Admitting: Internal Medicine

## 2016-06-25 ENCOUNTER — Ambulatory Visit: Payer: Commercial Managed Care - HMO | Admitting: Internal Medicine

## 2016-06-25 ENCOUNTER — Telehealth: Payer: Self-pay | Admitting: Medical Oncology

## 2016-06-25 ENCOUNTER — Encounter: Payer: Self-pay | Admitting: *Deleted

## 2016-06-25 ENCOUNTER — Ambulatory Visit: Payer: Commercial Managed Care - HMO

## 2016-06-25 ENCOUNTER — Other Ambulatory Visit: Payer: Commercial Managed Care - HMO

## 2016-06-25 ENCOUNTER — Other Ambulatory Visit: Payer: Self-pay | Admitting: *Deleted

## 2016-06-25 MED ORDER — SCOPOLAMINE 1 MG/3DAYS TD PT72: 1.0000 | MEDICATED_PATCH | TRANSDERMAL | 0 refills | Status: DC

## 2016-06-25 NOTE — Telephone Encounter (Signed)
Wife asking if pt needs to be seen today as he goes to [facility] later this week. I told her to cancel today and keep appt later in aug with [facility].

## 2016-06-25 NOTE — Telephone Encounter (Signed)
CY did order patches at last ov, but they were ordered in-house rather than sent to the pharmacy Called spoke with patient and apologized for the inconvenience, pt okay and voiced his understanding No quantity in the original order > spoke with CY: okay for #1 box Called [facility] to find out the quantity in #1 box because [facility] will only let me sent # patches > per [facility], #4 patches in 1 box Rx sent to verified pharmacy Med list updated Nothing further needed; will sign off

## 2016-06-25 NOTE — Patient Outreach (Signed)
[facility]) Care Management  [place] Discharge Home Visit 06/25/2016  Ernest [name] [birth year] [id number]  Ernest [name] "[name]" is an 60 y.o. male well known to [facility] for transition of care after multiple inpatient hospital visits, as well as ongoing self management of chronic disease state of COPD. [place] had placed referral in July 2017 to [facility] for his care coordination/ resource needs.  [facility] is now active in Ernest [name]'s ongoing care management.  Today's scheduled visit was planned for patient discharge from [place] program to wrap up the work he had accomplished while active with [facility]; [name] had expressed understanding during telephone outreach last week that he would be discharged from [facility] as [facility] is now involved.  When I arrived at Ernest [name]'s home this afternoon, it appeared that no one was present at the home.  I waited there for 15 minutes, and attempted telephone outreach to him which was unsuccessful. I left him a detailed voice mail message letting him know that I had been at his home today, and left my business card in his doorway.  ROS-- unable to be performed, as patient was not present at his home at time of scheduled visit.  Physical Exam-- unable to be performed, as patient was not present at his home at time of scheduled visit.  Plan:  Will discharge Ernest [name] from [place] program as planned last week, as [facility] is now active in his care coordination/ resource needs.  [name], RN, BSN, [employer] [facility] Care Management  [phone]

## 2016-06-26 DIAGNOSIS — C3412 Malignant neoplasm of upper lobe, left bronchus or lung: Secondary | ICD-10-CM | POA: Diagnosis not present

## 2016-06-26 DIAGNOSIS — D696 Thrombocytopenia, unspecified: Secondary | ICD-10-CM | POA: Diagnosis not present

## 2016-06-26 DIAGNOSIS — E1151 Type 2 diabetes mellitus with diabetic peripheral angiopathy without gangrene: Secondary | ICD-10-CM | POA: Diagnosis not present

## 2016-06-26 DIAGNOSIS — J189 Pneumonia, unspecified organism: Secondary | ICD-10-CM | POA: Diagnosis not present

## 2016-06-26 DIAGNOSIS — J45909 Unspecified asthma, uncomplicated: Secondary | ICD-10-CM | POA: Diagnosis not present

## 2016-06-26 DIAGNOSIS — I2510 Atherosclerotic heart disease of native coronary artery without angina pectoris: Secondary | ICD-10-CM | POA: Diagnosis not present

## 2016-06-26 DIAGNOSIS — I10 Essential (primary) hypertension: Secondary | ICD-10-CM | POA: Diagnosis not present

## 2016-06-26 DIAGNOSIS — J9611 Chronic respiratory failure with hypoxia: Secondary | ICD-10-CM | POA: Diagnosis not present

## 2016-06-26 DIAGNOSIS — J440 Chronic obstructive pulmonary disease with acute lower respiratory infection: Secondary | ICD-10-CM | POA: Diagnosis not present

## 2016-06-28 DIAGNOSIS — G893 Neoplasm related pain (acute) (chronic): Secondary | ICD-10-CM | POA: Diagnosis not present

## 2016-06-28 DIAGNOSIS — C3491 Malignant neoplasm of unspecified part of right bronchus or lung: Secondary | ICD-10-CM | POA: Diagnosis not present

## 2016-07-05 ENCOUNTER — Telehealth: Payer: Self-pay | Admitting: Internal Medicine

## 2016-07-05 ENCOUNTER — Telehealth: Payer: Self-pay | Admitting: Medical Oncology

## 2016-07-05 NOTE — Telephone Encounter (Signed)
Pt under palliative care-[facility] program at [place]. I spoke to RN and RN saw him 8/3 the patient was started on zpak. and decadron.

## 2016-07-05 NOTE — Telephone Encounter (Signed)
Spoke to wife , she and Shanon Brow are at [facility] and he is feeling a little better. They will see Mohaemd next tuesday

## 2016-07-05 NOTE — Telephone Encounter (Signed)
DUE TO PM PAL MOVED 8/15 LAB/FU/TX TO EARLIER TIME. LEFT MESSAGE FOR PATIENT AND MAILED SCHEDULE.

## 2016-07-07 ENCOUNTER — Other Ambulatory Visit: Payer: Self-pay | Admitting: Internal Medicine

## 2016-07-09 ENCOUNTER — Telehealth: Payer: Self-pay | Admitting: Internal Medicine

## 2016-07-09 ENCOUNTER — Ambulatory Visit: Payer: Commercial Managed Care - HMO

## 2016-07-09 ENCOUNTER — Ambulatory Visit (HOSPITAL_BASED_OUTPATIENT_CLINIC_OR_DEPARTMENT_OTHER): Payer: Commercial Managed Care - HMO | Admitting: Internal Medicine

## 2016-07-09 ENCOUNTER — Other Ambulatory Visit (HOSPITAL_BASED_OUTPATIENT_CLINIC_OR_DEPARTMENT_OTHER): Payer: Commercial Managed Care - HMO

## 2016-07-09 ENCOUNTER — Encounter: Payer: Self-pay | Admitting: Internal Medicine

## 2016-07-09 ENCOUNTER — Other Ambulatory Visit: Payer: Self-pay | Admitting: Internal Medicine

## 2016-07-09 VITALS — BP 138/59 | HR 92 | Temp 98.5°F | Resp 20 | Ht 66.0 in | Wt 186.7 lb

## 2016-07-09 DIAGNOSIS — C7801 Secondary malignant neoplasm of right lung: Secondary | ICD-10-CM

## 2016-07-09 DIAGNOSIS — C3491 Malignant neoplasm of unspecified part of right bronchus or lung: Secondary | ICD-10-CM

## 2016-07-09 DIAGNOSIS — Z79899 Other long term (current) drug therapy: Secondary | ICD-10-CM | POA: Diagnosis not present

## 2016-07-09 DIAGNOSIS — C3412 Malignant neoplasm of upper lobe, left bronchus or lung: Secondary | ICD-10-CM | POA: Diagnosis not present

## 2016-07-09 DIAGNOSIS — D696 Thrombocytopenia, unspecified: Secondary | ICD-10-CM | POA: Diagnosis not present

## 2016-07-09 DIAGNOSIS — J440 Chronic obstructive pulmonary disease with acute lower respiratory infection: Secondary | ICD-10-CM | POA: Diagnosis not present

## 2016-07-09 DIAGNOSIS — Z5112 Encounter for antineoplastic immunotherapy: Secondary | ICD-10-CM

## 2016-07-09 DIAGNOSIS — G894 Chronic pain syndrome: Secondary | ICD-10-CM

## 2016-07-09 DIAGNOSIS — J189 Pneumonia, unspecified organism: Secondary | ICD-10-CM | POA: Diagnosis not present

## 2016-07-09 LAB — COMPREHENSIVE METABOLIC PANEL
ALT: 12 U/L (ref 0–55)
ANION GAP: 10 meq/L (ref 3–11)
AST: 12 U/L (ref 5–34)
Albumin: 3 g/dL — ABNORMAL LOW (ref 3.5–5.0)
Alkaline Phosphatase: 104 U/L (ref 40–150)
BILIRUBIN TOTAL: 0.33 mg/dL (ref 0.20–1.20)
BUN: 9.2 mg/dL (ref 7.0–26.0)
CO2: 25 meq/L (ref 22–29)
Calcium: 9.9 mg/dL (ref 8.4–10.4)
Chloride: 103 mEq/L (ref 98–109)
Creatinine: 0.8 mg/dL (ref 0.7–1.3)
GLUCOSE: 121 mg/dL (ref 70–140)
POTASSIUM: 3.8 meq/L (ref 3.5–5.1)
SODIUM: 139 meq/L (ref 136–145)
TOTAL PROTEIN: 6.7 g/dL (ref 6.4–8.3)

## 2016-07-09 LAB — CBC WITH DIFFERENTIAL/PLATELET
BASO%: 1.1 % (ref 0.0–2.0)
BASOS ABS: 0.1 10*3/uL (ref 0.0–0.1)
EOS ABS: 0.3 10*3/uL (ref 0.0–0.5)
EOS%: 3.6 % (ref 0.0–7.0)
HCT: 38.8 % (ref 38.4–49.9)
HGB: 12.4 g/dL — ABNORMAL LOW (ref 13.0–17.1)
LYMPH%: 5.5 % — AB (ref 14.0–49.0)
MCH: 28.2 pg (ref 27.2–33.4)
MCHC: 31.8 g/dL — AB (ref 32.0–36.0)
MCV: 88.5 fL (ref 79.3–98.0)
MONO#: 0.5 10*3/uL (ref 0.1–0.9)
MONO%: 5.5 % (ref 0.0–14.0)
NEUT%: 84.3 % — AB (ref 39.0–75.0)
NEUTROS ABS: 8.2 10*3/uL — AB (ref 1.5–6.5)
PLATELETS: 143 10*3/uL (ref 140–400)
RBC: 4.39 10*6/uL (ref 4.20–5.82)
RDW: 17.1 % — ABNORMAL HIGH (ref 11.0–14.6)
WBC: 9.7 10*3/uL (ref 4.0–10.3)
lymph#: 0.5 10*3/uL — ABNORMAL LOW (ref 0.9–3.3)

## 2016-07-09 LAB — TSH: TSH: 2.261 m(IU)/L (ref 0.320–4.118)

## 2016-07-09 MED ORDER — MORPHINE SULFATE 15 MG PO TABS: 15.0000 mg | ORAL_TABLET | ORAL | 0 refills | Status: DC | PRN

## 2016-07-09 MED ORDER — MORPHINE SULFATE ER 30 MG PO TBCR: 30.0000 mg | EXTENDED_RELEASE_TABLET | Freq: Two times a day (BID) | ORAL | 0 refills | Status: DC

## 2016-07-09 NOTE — Telephone Encounter (Signed)
GAVE PATIENT AVS REPORT AND APPOINTMENTS FOR September.  °

## 2016-07-09 NOTE — Progress Notes (Signed)
[place] [phone]   Fax:([phone]  OFFICE PROGRESS NOTE  [name], MD [address] [facility] [address] [place] [address]  DIAGNOSIS: Metastatic non-small cell lung cancer, poorly differentiated carcinoma initially diagnosed as Stage IIIA (T3, N2, M0) non-small cell lung cancer consistent with squamous cell carcinoma involving the left suprahilar mass with mediastinal lymphadenopathy diagnosed in November of 2014. The patient has recurrence in February 2017. PDL 1 expression 0%.  PRIOR THERAPY:  1) Concurrent chemoradiation with weekly carboplatin for AUC of 2 and paclitaxel 45 mg/M2, status post 7 cycles, last dose was given 12/20/2013. First dose on 11/01/2013. 2) Consolidation chemotherapy with carboplatin for AUC of 5 and paclitaxel 175 mg/M2 every 3 weeks with Neulasta support. First cycle on 02/08/2014. Status post 3 cycles and carboplatin was discontinued secondary to allergic reaction. 3) Abraxane 100 MG/M2 on days 1 and 8 every 3 weeks. Status post 3 cycles. Last dose was given 04/16/2016 discontinued secondary to disease progression. 4) Nivolumab 240 MG IV every 2 weeks. First dose 05/27/2016. Status post one cycle. Discontinued secondary to disease progression.   CURRENT THERAPY: None. CHEMOTHERAPY INTENT: Palliative  CURRENT # OF CHEMOTHERAPY CYCLES: 1  CURRENT ANTIEMETICS: Zofran, dexamethasone and Compazine  CURRENT SMOKING STATUS: Former smoker  ORAL CHEMOTHERAPY AND CONSENT: None  CURRENT BISPHOSPHONATES USE: None  PAIN MANAGEMENT: 0/10  NARCOTICS INDUCED CONSTIPATION: None.  LIVING WILL AND CODE STATUS: Full code.   INTERVAL HISTORY: Ernest [name] 60 y.o. male returns to the clinic today for follow-up visit accompanied by his wife. The patient is feeling much better today. He continues to have shortness breath with exertion. He was seen recently by Dr. Durenda Hurt at [facility] for second  opinion he is considered for clinical trial with a MET inhibitor of the molecular study showed the presence of MET mutation. He denied having any significant chest pain or hemoptysis. He denied having any nausea or vomiting, no fever or chills. He is here today for evaluation and discussion of his treatment options.  MEDICAL HISTORY: Past Medical History:  Diagnosis Date  . Anxiety   . CAD (coronary artery disease)    Left Main 30% stenosis, LAD 20 - 30 % stenosis, first and second diagonal branchesat 40 - 50%  stenosis with small arteries, circumflex had 30% stenosis in the large obtuse marginal, RCA at 70 - 80%  stenosis [not felt to be occlusive after evaluation with flow wire], distal 50 - 60% stenosis - [name][  . Cancer ([place])    lung  . Chronic insomnia   . COPD (chronic obstructive pulmonary disease) (HCC)    Dr. Baird Lyons  . COPD with asthma ([place]) 09/08/2007  . DDD (degenerative disc disease)   . Depression   . Diabetes mellitus without complication ([place]) 7/90/3833  . GERD (gastroesophageal reflux disease)   . Gout   . History of cardiovascular stress test    Myoview 7/16:  Diaphragmatic attenuation, no ischemia, EF 56%; Low Risk  . History of radiation therapy 11/10/13- 12/29/13   left lung 6600 cGy in 33 sessions  . Hx of colonoscopy   . Hyperlipidemia   . Hypertension    dr Percival Spanish  . Itching due to drug 03/19/2016  . Lung cancer ([place]) 10/04/13   LUL squamous cell lung cancer  . Nausea with vomiting 03/26/2016  . OSA (obstructive sleep apnea)    NPSG 09/10/10- AHI 11.3/hr  . PVD (peripheral vascular disease) ([name])  PTA/Stent right common iliac    ALLERGIES:  is allergic to carboplatin.  MEDICATIONS:  Current Outpatient Prescriptions  Medication Sig Dispense Refill  . ADVAIR DISKUS 500-50 MCG/DOSE AEPB 1 PUFF THEN RINSE MOUTH, TWICE DAILY MAINTENANCE 60 each 2  . albuterol (PROVENTIL) (2.5 MG/3ML) 0.083% nebulizer solution INHALE 1 VIAL IN NEBULIZER  EVERY 4 HOURS AS NEEDED FOR WHEEZING OR SHORTNESS OF BREATH 300 mL 2  . ARIPiprazole (ABILIFY) 2 MG tablet Take 2 mg by mouth at bedtime.   4  . Armodafinil 250 MG tablet Take 250 mg by mouth every morning.  5  . aspirin 81 MG EC tablet TAKE 1 TABLET BY MOUTH EVERY DAY 30 tablet 5  . atorvastatin (LIPITOR) 20 MG tablet TAKE 1 TABLET EVERY DAY (Patient taking differently: TAKE 20 MG  EVERY DAY IN THE EVENING) 90 tablet 3  . BELSOMRA 20 MG TABS Take 20 mg by mouth at bedtime as needed (for sleep). Reported on 03/29/2016  4  . clonazePAM (KLONOPIN) 1 MG tablet Take 2 mg by mouth at bedtime as needed for anxiety. Reported on 01/16/2016    . clopidogrel (PLAVIX) 75 MG tablet Take 1 tablet (75 mg total) by mouth daily. 90 tablet 3  . dextromethorphan-guaiFENesin (MUCINEX DM) 30-600 MG 12hr tablet Take 1 tablet by mouth 2 (two) times daily as needed for cough (congestion).    [name] esomeprazole (NEXIUM) 40 MG capsule TAKE ONE CAPSULE BY MOUTH EVERY DAY (Patient taking differently: TAKE 40 MG  BY MOUTH EVERY DAY) 90 capsule 3  . feeding supplement, ENSURE ENLIVE, (ENSURE ENLIVE) LIQD Take 237 mLs by mouth 2 (two) times daily between meals. 237 mL 12  . FLORA-Q (FLORA-Q) CAPS capsule Take 1 capsule by mouth daily. 20 capsule 0  . FLUoxetine (PROZAC) 40 MG capsule Take 40 mg by mouth every morning.    . gabapentin (NEURONTIN) 100 MG capsule Take 100 mg by mouth daily.     [name] glipiZIDE (GLUCOTROL) 5 MG tablet Take 0.5 tablets (2.5 mg total) by mouth daily before breakfast. 30 tablet 5  . mirtazapine (REMERON) 45 MG tablet Take 1 tablet (45 mg total) by mouth at bedtime.    [name] morphine (MS CONTIN) 30 MG 12 hr tablet Take 1 tablet (30 mg total) by mouth every 12 (twelve) hours. 60 tablet 0  . morphine (MSIR) 15 MG tablet Take 1 tablet (15 mg total) by mouth every 4 (four) hours as needed for severe pain. 30 tablet 0  . nitroGLYCERIN (NITROSTAT) 0.4 MG SL tablet Place 1 tablet (0.4 mg total) under the tongue every 5  (five) minutes as needed for chest pain. 25 tablet 3  . ondansetron (ZOFRAN) 8 MG tablet TAKE 1 TABLET (8 MG TOTAL) BY MOUTH EVERY 8 (EIGHT) HOURS AS NEEDED FOR NAUSEA OR VOMITING. 20 tablet 0  . OXYGEN Inhale 2 L into the lungs daily.     . potassium chloride SA (K-DUR,KLOR-CON) 20 MEQ tablet Take 1 tablet (20 mEq total) by mouth every morning. Reported on 12/07/2015 90 tablet 1  . prochlorperazine (COMPAZINE) 10 MG tablet TAKE 1 TABLET EVERY 6 HOURS AS NEEDED FOR NAUSEA AND VOMITING (Patient taking differently: TAKE 10 MG EVERY 6 HOURS AS NEEDED FOR NAUSEA AND VOMITING) 30 tablet 0  . scopolamine (TRANSDERM-SCOP) 1 MG/3DAYS Place 1 patch (1.5 mg total) onto the skin every 3 (three) days. 4 patch 0  . theophylline (UNIPHYL) 400 MG 24 hr tablet TAKE 1 TABLET EVERY DAY (Patient taking differently: TAKE 400  MG BY MOUTH DAILY) 30 tablet 5  . Tiotropium Bromide Monohydrate (SPIRIVA RESPIMAT) 1.25 MCG/ACT AERS Inhale 2 puffs into the lungs daily. 4 g 11  . VENTOLIN HFA 108 (90 Base) MCG/ACT inhaler INHALE 2 PUFFS INTO THE LUNGS 4 TIMES DAILY AS NEEDED FOR WHEEZING 18 Inhaler 2   No current facility-administered medications for this visit.     SURGICAL HISTORY:  Past Surgical History:  Procedure Laterality Date  . ANGIOPLASTY    . ANTERIOR FUSION CERVICAL SPINE     cervical fusion  7 yrs ago ([facility])  . arm surgery     left elbow  . BACK SURGERY     lower  . c-spine surgery    . COLON SURGERY     '11 "Diverticulitis"  . COLONOSCOPY WITH PROPOFOL N/A 05/22/2015   Procedure: COLONOSCOPY WITH PROPOFOL;  Surgeon: Inda Castle, MD;  Location: [facility];  Service: Endoscopy;  Laterality: N/A;  . HIP SURGERY     left 'bone graft taken"  . PERIPHERAL VASCULAR CATHETERIZATION N/A 08/10/2015   Procedure: Lower Extremity Angiography;  Surgeon: Lorretta Harp, MD; Distal Ao OK, L-EIA stent OK, R-CIA 100% s/p overlapping 7 mm x 38 mm ICast stents, 50-60% R-CFA      . SHOULDER SURGERY     right  .  SPINAL FUSION  03/05/2007   L4-L5  . VIDEO BRONCHOSCOPY WITH ENDOBRONCHIAL NAVIGATION N/A 10/04/2013   Procedure: VIDEO BRONCHOSCOPY WITH ENDOBRONCHIAL NAVIGATION;  Surgeon: Collene Gobble, MD;  Location: [facility];  Service: Thoracic;  Laterality: N/A;    REVIEW OF SYSTEMS:  A comprehensive review of systems was negative except for: Constitutional: positive for fatigue Respiratory: positive for dyspnea on exertion   PHYSICAL EXAMINATION: General appearance: alert, cooperative and no distress Head: Normocephalic, without obvious abnormality, atraumatic Neck: no adenopathy, no JVD, supple, symmetrical, trachea midline and thyroid not enlarged, symmetric, no tenderness/mass/nodules Lymph nodes: Cervical, supraclavicular, and axillary nodes normal. Resp: wheezes bilaterally Back: symmetric, no curvature. ROM normal. No CVA tenderness. Cardio: regular rate and rhythm, S1, S2 normal, no murmur, click, rub or gallop GI: soft, non-tender; bowel sounds normal; no masses,  no organomegaly Extremities: extremities normal, atraumatic, no cyanosis or edema Neurologic: Alert and oriented X 3, normal strength and tone. Normal symmetric reflexes. Normal coordination and gait  ECOG PERFORMANCE STATUS: 1 - Symptomatic but completely ambulatory  Blood pressure (!) 138/59, pulse 92, temperature 98.5 F (36.9 C), temperature source Oral, resp. rate 20, height _0  (1.676 m), weight 186 lb 11.2 oz (84.7 kg), SpO2 97 %.  LABORATORY DATA: Lab Results  Component Value Date   WBC 9.7 07/09/2016   HGB 12.4 (L) 07/09/2016   HCT 38.8 07/09/2016   MCV 88.5 07/09/2016   PLT 143 07/09/2016      Chemistry      Component Value Date/Time   NA 139 07/09/2016 1014   K 3.8 07/09/2016 1014   CL 94 (L) 06/07/2016 0915   CO2 25 07/09/2016 1014   BUN 9.2 07/09/2016 1014   CREATININE 0.8 07/09/2016 1014      Component Value Date/Time   CALCIUM 9.9 07/09/2016 1014   ALKPHOS 104 07/09/2016 1014   AST 12 07/09/2016  1014   ALT 12 07/09/2016 1014   BILITOT 0.33 07/09/2016 1014       RADIOGRAPHIC STUDIES: No results found.  ASSESSMENT AND PLAN: This is a very pleasant 60 years old white male with stage IIIA non-small cell lung cancer, squamous cell carcinoma currently  undergoing concurrent chemoradiation with weekly carboplatin and paclitaxel status post 7 weeks of treatment. He tolerated his treatment fairly well with no significant adverse effects. This was followed by consolidation chemotherapy with carboplatin and paclitaxel status post 3 cycles, .carboplatin was discontinued after cycle #2 secondary to hypersensitivity reaction. The recent CT scan of the chest progressive enlargement of the right lower lobe lung nodule concerning for pulmonary metastasis or new synchronous tumor. The recent PET scan showed hypermetabolic activity in the 1.9 cm right lower lobe pulmonary nodule suspicious for metastasis. There was also small mediastinal node metastasis. CT-guided core biopsy of the right lower lobe pulmonary nodule showed poorly differentiated carcinoma. PDL 1 expression was 0% The patient was started on treatment with single agent Abraxane status post 3 cycles but unfortunately restaging CT scan of the chest showed evidence for disease progression. He was then started on treatment with immunotherapy with Nivolumab status post 1 cycle.[name]  Unfortunately the recent CT scan of the chest showed significant disease progression but this is not indicative of his response to the immunotherapy as he received only one cycle. The patient is currently being evaluated for a clinical trial with a MET inhibitor at [facility]. He is still very interested in considering future treatment even if he is not eligible for the trial. I will see him back for follow-up visit in 4 weeks for reevaluation and more detailed discussion of his treatment options based on his eligibility for the clinical trial at [facility]. He was advised  to call immediately if he has any concerning symptoms in the interval. The patient voices understanding of current disease status and treatment options and is in agreement with the current care plan.  All questions were answered. The patient knows to call the clinic with any problems, questions or concerns. We can certainly see the patient much sooner if necessary.  Disclaimer: This note was dictated with voice recognition software. Similar sounding words can inadvertently be transcribed and may not be corrected upon review.

## 2016-07-13 ENCOUNTER — Telehealth: Payer: Self-pay | Admitting: Internal Medicine

## 2016-07-13 NOTE — Telephone Encounter (Signed)
Called and s/w pt's wife, she says appt was supposed to be scheduled on Tuesday because that is the only day she can come with him to appt. Gave new appt 9/12 @ 1.15pm.

## 2016-07-16 ENCOUNTER — Telehealth: Payer: Self-pay

## 2016-07-16 NOTE — Telephone Encounter (Signed)
Wife called to let us know pt has been having daily headaches for a little over a week. She had xanax 0.25 in house that pt took and this helped. He has a hx of anxiety headaches. He would hold the top right side of his head. Pt went to bathroom during call so unable to elicit more information. Pt has also been constipated and took an enema today. Wife requesting call back .

## 2016-07-16 NOTE — Telephone Encounter (Signed)
I spoke to pt. For the past week he has had dull  pain starting on his right  shoulder to right side of neck  to the front  of his head . He said it is not like his "anxiety headache " . His morphine does not help the pain. Note to [place] . I told pt that someone will call him back tomorrow.

## 2016-07-18 ENCOUNTER — Other Ambulatory Visit: Payer: Self-pay | Admitting: Internal Medicine

## 2016-07-18 ENCOUNTER — Encounter: Payer: Self-pay | Admitting: Internal Medicine

## 2016-07-18 ENCOUNTER — Ambulatory Visit (INDEPENDENT_AMBULATORY_CARE_PROVIDER_SITE_OTHER)
Admission: RE | Admit: 2016-07-18 | Discharge: 2016-07-18 | Disposition: A | Discharge status: Home / Self Care | Payer: Commercial Managed Care - HMO | Source: Ambulatory Visit | Attending: Internal Medicine | Admitting: Internal Medicine

## 2016-07-18 ENCOUNTER — Ambulatory Visit (INDEPENDENT_AMBULATORY_CARE_PROVIDER_SITE_OTHER): Payer: Commercial Managed Care - HMO | Admitting: Internal Medicine

## 2016-07-18 VITALS — BP 120/70 | HR 61 | Temp 98.7°F | Resp 20 | Ht 66.0 in | Wt 183.0 lb

## 2016-07-18 DIAGNOSIS — C3491 Malignant neoplasm of unspecified part of right bronchus or lung: Secondary | ICD-10-CM

## 2016-07-18 DIAGNOSIS — K5903 Drug induced constipation: Secondary | ICD-10-CM

## 2016-07-18 DIAGNOSIS — R059 Cough, unspecified: Secondary | ICD-10-CM

## 2016-07-18 DIAGNOSIS — T402X5A Adverse effect of other opioids, initial encounter: Principal | ICD-10-CM

## 2016-07-18 DIAGNOSIS — R05 Cough: Secondary | ICD-10-CM | POA: Diagnosis not present

## 2016-07-18 DIAGNOSIS — G501 Atypical facial pain: Secondary | ICD-10-CM | POA: Insufficient documentation

## 2016-07-18 DIAGNOSIS — R079 Chest pain, unspecified: Secondary | ICD-10-CM | POA: Diagnosis not present

## 2016-07-18 DIAGNOSIS — R51 Headache: Secondary | ICD-10-CM

## 2016-07-18 DIAGNOSIS — R519 Headache, unspecified: Secondary | ICD-10-CM

## 2016-07-18 DIAGNOSIS — J449 Chronic obstructive pulmonary disease, unspecified: Secondary | ICD-10-CM | POA: Diagnosis not present

## 2016-07-18 DIAGNOSIS — R140 Abdominal distension (gaseous): Secondary | ICD-10-CM | POA: Diagnosis not present

## 2016-07-18 MED ORDER — METHYLNALTREXONE BROMIDE 150 MG PO TABS: 3.0000 | ORAL_TABLET | Freq: Every morning | ORAL | 11 refills | Status: DC

## 2016-07-18 NOTE — Progress Notes (Signed)
Subjective:  Patient ID: Ernest [name], male    DOB: Feb 13, 1956  Age: 60 y.o. MRN: [id number]  CC: Headache and Cough   HPI Ernest [name] presents for Several concerns.  He complains of a headache for about a week. He describes it as an achy sensation behind both eyes, more prominently on the right than the left. He has had a few episodes of nausea and numbness in both hands but he denies vomiting, visual disturbance, ataxia, slurred speech, weakness, or tingling.  He also complains that he hasn't have a bowel movement in 5 days. He has tried over-the-counter doses of MiraLAX, Senokot, and enemas. He said his abdomen feels bloated but he denies abdominal pain, diarrhea, bloody stool, or cramping.  He also complains of her chronic cough that is productive of clear phlegm. Over the last 2 weeks he has developed a vague achy sensation of the right upper side of his chest. He denies hemoptysis, fever, chills, or night sweats. His wheezing and shortness of breath is at its baseline. He has a recent history of pneumonia and he has been treated for lung cancer.  Outpatient Medications Prior to Visit  Medication Sig Dispense Refill  . ADVAIR DISKUS 500-50 MCG/DOSE AEPB 1 PUFF THEN RINSE MOUTH, TWICE DAILY MAINTENANCE 60 each 2  . albuterol (PROVENTIL) (2.5 MG/3ML) 0.083% nebulizer solution INHALE 1 VIAL IN NEBULIZER EVERY 4 HOURS AS NEEDED FOR WHEEZING OR SHORTNESS OF BREATH 300 mL 2  . ARIPiprazole (ABILIFY) 2 MG tablet Take 2 mg by mouth at bedtime.   4  . Armodafinil 250 MG tablet Take 250 mg by mouth every morning.  5  . aspirin 81 MG EC tablet TAKE 1 TABLET BY MOUTH EVERY DAY 30 tablet 5  . atorvastatin (LIPITOR) 20 MG tablet TAKE 1 TABLET EVERY DAY (Patient taking differently: TAKE 20 MG  EVERY DAY IN THE EVENING) 90 tablet 3  . BELSOMRA 20 MG TABS Take 20 mg by mouth at bedtime as needed (for sleep). Reported on 03/29/2016  4  . clonazePAM (KLONOPIN) 1 MG tablet Take 2 mg by  mouth at bedtime as needed for anxiety. Reported on 01/16/2016    . clopidogrel (PLAVIX) 75 MG tablet Take 1 tablet (75 mg total) by mouth daily. 90 tablet 3  . dextromethorphan-guaiFENesin (MUCINEX DM) 30-600 MG 12hr tablet Take 1 tablet by mouth 2 (two) times daily as needed for cough (congestion).    [name] esomeprazole (NEXIUM) 40 MG capsule TAKE ONE CAPSULE BY MOUTH EVERY DAY (Patient taking differently: TAKE 40 MG  BY MOUTH EVERY DAY) 90 capsule 3  . feeding supplement, ENSURE ENLIVE, (ENSURE ENLIVE) LIQD Take 237 mLs by mouth 2 (two) times daily between meals. 237 mL 12  . FLORA-Q (FLORA-Q) CAPS capsule Take 1 capsule by mouth daily. 20 capsule 0  . FLUoxetine (PROZAC) 40 MG capsule Take 40 mg by mouth every morning.    . gabapentin (NEURONTIN) 100 MG capsule Take 100 mg by mouth daily.     [name] glipiZIDE (GLUCOTROL) 5 MG tablet Take 0.5 tablets (2.5 mg total) by mouth daily before breakfast. 30 tablet 5  . mirtazapine (REMERON) 45 MG tablet Take 1 tablet (45 mg total) by mouth at bedtime.    [name] morphine (MS CONTIN) 30 MG 12 hr tablet Take 1 tablet (30 mg total) by mouth every 12 (twelve) hours. 60 tablet 0  . morphine (MSIR) 15 MG tablet Take 1 tablet (15 mg total) by mouth every 4 (four) hours  as needed for severe pain. 30 tablet 0  . ondansetron (ZOFRAN) 8 MG tablet TAKE 1 TABLET (8 MG TOTAL) BY MOUTH EVERY 8 (EIGHT) HOURS AS NEEDED FOR NAUSEA OR VOMITING. 20 tablet 0  . OXYGEN Inhale 2 L into the lungs daily.     . potassium chloride SA (K-DUR,KLOR-CON) 20 MEQ tablet Take 1 tablet (20 mEq total) by mouth every morning. Reported on 12/07/2015 90 tablet 1  . prochlorperazine (COMPAZINE) 10 MG tablet TAKE 1 TABLET EVERY 6 HOURS AS NEEDED FOR NAUSEA AND VOMITING (Patient taking differently: TAKE 10 MG EVERY 6 HOURS AS NEEDED FOR NAUSEA AND VOMITING) 30 tablet 0  . scopolamine (TRANSDERM-SCOP) 1 MG/3DAYS Place 1 patch (1.5 mg total) onto the skin every 3 (three) days. 4 patch 0  . theophylline (UNIPHYL)  400 MG 24 hr tablet TAKE 1 TABLET EVERY DAY (Patient taking differently: TAKE 400 MG BY MOUTH DAILY) 30 tablet 5  . Tiotropium Bromide Monohydrate (SPIRIVA RESPIMAT) 1.25 MCG/ACT AERS Inhale 2 puffs into the lungs daily. 4 g 11  . VENTOLIN HFA 108 (90 Base) MCG/ACT inhaler INHALE 2 PUFFS INTO THE LUNGS 4 TIMES DAILY AS NEEDED FOR WHEEZING 18 Inhaler 2  . nitroGLYCERIN (NITROSTAT) 0.4 MG SL tablet Place 1 tablet (0.4 mg total) under the tongue every 5 (five) minutes as needed for chest pain. (Patient not taking: Reported on 07/18/2016) 25 tablet 3   No facility-administered medications prior to visit.     ROS Review of Systems  Constitutional: Negative.  Negative for activity change, appetite change, chills, diaphoresis and fever.  HENT: Negative for sore throat.   Eyes: Negative.   Respiratory: Positive for cough, shortness of breath and wheezing. Negative for apnea, choking, chest tightness and stridor.   Cardiovascular: Positive for chest pain. Negative for palpitations and leg swelling.  Gastrointestinal: Positive for constipation and nausea. Negative for abdominal distention, abdominal pain, anal bleeding, blood in stool, diarrhea and vomiting.  Endocrine: Negative.   Genitourinary: Negative.   Musculoskeletal: Negative.  Negative for arthralgias, back pain, myalgias and neck pain.  Skin: Negative.  Negative for color change and rash.  Allergic/Immunologic: Negative.   Neurological: Positive for numbness and headaches. Negative for dizziness, seizures, speech difficulty and weakness.  Hematological: Negative for adenopathy. Does not bruise/bleed easily.  Psychiatric/Behavioral: Negative.  Negative for agitation, sleep disturbance and suicidal ideas. The patient is not nervous/anxious.     Objective:  BP 120/70 (BP Location: Left Arm, Patient Position: Sitting, Cuff Size: Normal)   Pulse 61   Temp 98.7 F (37.1 C) (Oral)   Resp 20   Ht '5\' 6"'$  (1.676 m)   Wt 183 lb (83 kg)   SpO2  90%   BMI 29.54 kg/m   BP Readings from Last 3 Encounters:  07/18/16 120/70  07/09/16 (!) 138/59  06/18/16 126/72    Wt Readings from Last 3 Encounters:  07/18/16 183 lb (83 kg)  07/09/16 186 lb 11.2 oz (84.7 kg)  06/18/16 182 lb 6.4 oz (82.7 kg)    Physical Exam  Constitutional: He is oriented to person, place, and time. No distress.  HENT:  Mouth/Throat: Oropharynx is clear and moist. No oropharyngeal exudate.  Eyes: Conjunctivae are normal. Right eye exhibits no discharge. Left eye exhibits no discharge. No scleral icterus.  Neck: Normal range of motion. Neck supple. No JVD present. No tracheal deviation present. No thyromegaly present.  Cardiovascular: Normal rate, regular rhythm, normal heart sounds and intact distal pulses.  Exam reveals no gallop and  no friction rub.   No murmur heard. Pulmonary/Chest: Effort normal. No accessory muscle usage or stridor. No tachypnea. No respiratory distress. He has decreased breath sounds in the right upper field. He has no wheezes. He has rhonchi in the right middle field and the left middle field. He has no rales. He exhibits no tenderness.  Abdominal: Soft. Normal appearance and bowel sounds are normal. He exhibits no distension and no mass. There is no tenderness. There is no rebound and no guarding.  Musculoskeletal: Normal range of motion. He exhibits no edema or tenderness.  Lymphadenopathy:    He has no cervical adenopathy.  Neurological: He is oriented to person, place, and time.  Skin: Skin is warm and dry. No rash noted. He is not diaphoretic. No erythema. No pallor.  Psychiatric: He has a normal mood and affect. His behavior is normal. Judgment and thought content normal.  Vitals reviewed.   Lab Results  Component Value Date   WBC 9.7 07/09/2016   HGB 12.4 (L) 07/09/2016   HCT 38.8 07/09/2016   PLT 143 07/09/2016   GLUCOSE 121 07/09/2016   CHOL 164 10/05/2015   TRIG 174.0 (H) 10/05/2015   HDL 48.70 10/05/2015    LDLDIRECT 71.4 09/08/2012   LDLCALC 80 10/05/2015   ALT 12 07/09/2016   AST 12 07/09/2016   NA 139 07/09/2016   K 3.8 07/09/2016   CL 94 (L) 06/07/2016   CREATININE 0.8 07/09/2016   BUN 9.2 07/09/2016   CO2 25 07/09/2016   TSH 2.261 07/09/2016   PSA 0.64 07/18/2011   INR 1.14 04/21/2016   HGBA1C 8.1 (H) 04/22/2016   MICROALBUR 5.7 (H) 10/05/2015    Dg Chest 2 View  Result Date: 06/07/2016 CLINICAL DATA:  Right-sided chest pain with shortness of breath. History of lung carcinoma EXAM: CHEST  2 VIEW COMPARISON:  Chest CT Apr 22, 2016; May 19, 2016 chest radiograph FINDINGS: There is a mass in the posterior segment of the right lower lobe measuring 7.7 x 7.6 x 6.3 cm, increased in size. There is airspace consolidation in the medial segment of the right middle lobe. There is stable consolidation with volume loss in the left apex. Heart size is normal. Pulmonary vascularity is normal on the right with some distortion superiorly on the left. No adenopathy is demonstrable by radiography. There is postoperative change in the lower cervical spine. No blastic or bone lesions are evident. IMPRESSION: Enlarging posterior segment right lower lobe mass. Areas of airspace consolidation in the right middle lobe and left apex. Stable cardiac silhouette. Electronically Signed   By: Lowella Grip III M.D.   On: 06/07/2016 09:30   Ct Angio Chest Pe W/cm &/or Wo Cm  Result Date: 06/07/2016 CLINICAL DATA:  Right-sided chest pain, shortness of breath and nausea. EXAM: CT ANGIOGRAPHY CHEST WITH CONTRAST TECHNIQUE: Multidetector CT imaging of the chest was performed using the standard protocol during bolus administration of intravenous contrast. Multiplanar CT image reconstructions and MIPs were obtained to evaluate the vascular anatomy. CONTRAST:  100 cc of Isovue 370 COMPARISON:  04/22/2016 FINDINGS: Mediastinum/Lymph Nodes: The heart size appears mildly enlarged. There is aortic atherosclerosis noted.  Calcification involving the RCA, LAD and left circumflex coronary artery is identified. The main pulmonary artery is patent. No saddle embolus. No lobar or segmental pulmonary artery filling defects identified. There is a right paratracheal lymph node which measures 2 cm, image 47 of series 4. On the previous exam this had a short axis of 8 mm. Sub-  carinal lymph node measures 1.9 cm, image number 62 of series 4. New from previous exam. Posterior mediastinal node adjacent to the esophagus on the right measures 1.5 cm, image 79 of series 4. Previously 1.2 cm. There is no axillary or supraclavicular adenopathy. Lungs/Pleura: Similar appearance of right pleural effusion. Mass within the right lower lobe measures 5.2 x 6.9 cm, image 91 of series 4. On the previous exam this measured 2.6 x 2.7 cm. There is airspace consolidation within the right middle lobe, similar to previous study. Subpleural nodule within the left lower lobe is unchanged measuring 6 mm, image 89 of series 5. Similar appearance of consolidation and cavitation involving the left upper lobe. Upper abdomen: Low-attenuation structure within the spleen measures 5.1 cm, image 148 of series 4. Not significantly changed from previous exam. No suspicious liver abnormality. The adrenal glands are normal. The visualized portions of the pancreas are normal. Musculoskeletal: No chest wall mass or suspicious bone lesions identified. Review of the MIP images confirms the above findings. IMPRESSION: 1. There is no evidence for acute pulmonary embolus. 2. Interval progression of mediastinal adenopathy. 3. Interval increase in size of right lung mass. Electronically Signed   By: Kerby Moors M.D.   On: 06/07/2016 15:02    Assessment & Plan:   Marven was seen today for headache and cough.  Diagnoses and all orders for this visit:  Therapeutic opioid-induced constipation (OIC)- his abdominal exam is normal and plain films show no evidence of ileus or  obstruction, will treat the OIC with Relistor. -     Methylnaltrexone Bromide (RELISTOR) 150 MG TABS; Take 3 tablets by mouth every morning. -     Cancel: DG Abd Acute W/Chest; Future  Acute intractable headache, unspecified headache type- I ordered a CT scan of the brain to see if he has any metastatic disease -     CT HEAD WO CONTRAST; Future  Non-small cell carcinoma of lung, right ([place])- chest x-ray today shows progression of the mass in his right upper lobe with increasing associated lymphadenopathy. He will continue to follow the recommendations of his oncologist. -     [place]; Future -     DG Chest 2 View; Future  Cough - as above -     DG Chest 2 View; Future   I am having Mr. Hustead start on Methylnaltrexone Bromide. I am also having him maintain his clonazePAM, nitroGLYCERIN, mirtazapine, FLUoxetine, gabapentin, atorvastatin, clopidogrel, potassium chloride SA, BELSOMRA, OXYGEN, dextromethorphan-guaiFENesin, Armodafinil, theophylline, prochlorperazine, Tiotropium Bromide Monohydrate, FLORA-Q, ADVAIR DISKUS, esomeprazole, glipiZIDE, feeding supplement (ENSURE ENLIVE), VENTOLIN HFA, ARIPiprazole, ondansetron, albuterol, scopolamine, aspirin, morphine, and morphine.  Meds ordered this encounter  Medications  . Methylnaltrexone Bromide (RELISTOR) 150 MG TABS    Sig: Take 3 tablets by mouth every morning.    Dispense:  90 tablet    Refill:  11     Follow-up: Return in about 1 week (around 07/25/2016).  [name], MD

## 2016-07-18 NOTE — Progress Notes (Signed)
Pre visit review using our clinic review tool, if applicable. No additional management support is needed unless otherwise documented below in the visit note. 

## 2016-07-18 NOTE — Patient Instructions (Signed)

## 2016-07-19 ENCOUNTER — Telehealth: Payer: Self-pay | Admitting: *Deleted

## 2016-07-19 MED ORDER — PROCHLORPERAZINE MALEATE 10 MG PO TABS: ORAL_TABLET | ORAL | 1 refills | Status: DC

## 2016-07-19 NOTE — Telephone Encounter (Signed)
Per MD, pt to be further evaluated by ED if headache worsens, vomiting, fever, changes with balance.  Pt is taking compazine which is helping , request refill as he is almost out. Rx sent to pt pharmacy.

## 2016-07-19 NOTE — Telephone Encounter (Signed)
Received call from pt stating that he needs to get back on [facility] schedule.  He reports that he was not eligible for [facility] trial so needs to get back on chemo. Message to Dr Julien Nordmann & Pod/RN

## 2016-07-19 NOTE — Telephone Encounter (Signed)
Pt has headache behind right eye x 1 week. Painful, nothing helps to make better only sleep, c/o nausea, constipation x 5 days, had BM today. Pt unable to come in to see Selena Lesser as he sates he has a nurse coming to the house this after afternoon. Informed pt I will review with MD and call back with additional information.

## 2016-07-19 NOTE — Telephone Encounter (Signed)
-----   Message from Ardeen Garland, RN sent at 07/16/2016  3:04 PM EDT ----- Regarding: new headache  spoke to pt. For the past week he has had dull  pain starting on his right  shoulder to right side of neck  to the front  of his head . He said it is not like his "anxiety headache " . His morphine does not help the pain. Note to [place] . I told pt that someone will call him back tomorrow.   Please RSVP to [facility]

## 2016-07-22 ENCOUNTER — Other Ambulatory Visit: Payer: Self-pay | Admitting: Internal Medicine

## 2016-07-22 ENCOUNTER — Telehealth: Payer: Self-pay | Admitting: Medical Oncology

## 2016-07-22 DIAGNOSIS — I10 Essential (primary) hypertension: Secondary | ICD-10-CM

## 2016-07-22 NOTE — Telephone Encounter (Signed)
Nausea has settled and he is having BM . However his headaches are back and his breathing is worse. I offered [facility] at 0915 and [facility] at 0930.

## 2016-07-23 ENCOUNTER — Ambulatory Visit: Payer: Commercial Managed Care - HMO | Admitting: Internal Medicine

## 2016-07-23 ENCOUNTER — Ambulatory Visit (HOSPITAL_COMMUNITY)
Admission: RE | Admit: 2016-07-23 | Discharge: 2016-07-23 | Disposition: A | Discharge status: Home / Self Care | Payer: Commercial Managed Care - HMO | Source: Ambulatory Visit | Attending: Nurse Practitioner | Admitting: Nurse Practitioner

## 2016-07-23 ENCOUNTER — Encounter: Payer: Self-pay | Admitting: Nurse Practitioner

## 2016-07-23 ENCOUNTER — Other Ambulatory Visit: Payer: Commercial Managed Care - HMO

## 2016-07-23 ENCOUNTER — Ambulatory Visit (HOSPITAL_BASED_OUTPATIENT_CLINIC_OR_DEPARTMENT_OTHER): Payer: Commercial Managed Care - HMO | Admitting: Nurse Practitioner

## 2016-07-23 ENCOUNTER — Ambulatory Visit: Payer: Commercial Managed Care - HMO

## 2016-07-23 ENCOUNTER — Other Ambulatory Visit (HOSPITAL_BASED_OUTPATIENT_CLINIC_OR_DEPARTMENT_OTHER): Payer: Commercial Managed Care - HMO

## 2016-07-23 VITALS — BP 137/61 | HR 81 | Temp 97.9°F | Resp 19 | Ht 66.0 in | Wt 182.8 lb

## 2016-07-23 DIAGNOSIS — R519 Headache, unspecified: Secondary | ICD-10-CM

## 2016-07-23 DIAGNOSIS — R51 Headache: Secondary | ICD-10-CM | POA: Diagnosis not present

## 2016-07-23 DIAGNOSIS — D7389 Other diseases of spleen: Secondary | ICD-10-CM | POA: Insufficient documentation

## 2016-07-23 DIAGNOSIS — R591 Generalized enlarged lymph nodes: Secondary | ICD-10-CM | POA: Insufficient documentation

## 2016-07-23 DIAGNOSIS — C3491 Malignant neoplasm of unspecified part of right bronchus or lung: Secondary | ICD-10-CM | POA: Diagnosis not present

## 2016-07-23 DIAGNOSIS — C3490 Malignant neoplasm of unspecified part of unspecified bronchus or lung: Secondary | ICD-10-CM | POA: Diagnosis not present

## 2016-07-23 LAB — COMPREHENSIVE METABOLIC PANEL
ALBUMIN: 3.3 g/dL — AB (ref 3.5–5.0)
ALT: 11 U/L (ref 0–55)
AST: 15 U/L (ref 5–34)
Alkaline Phosphatase: 115 U/L (ref 40–150)
Anion Gap: 13 mEq/L — ABNORMAL HIGH (ref 3–11)
BILIRUBIN TOTAL: 0.37 mg/dL (ref 0.20–1.20)
BUN: 9.3 mg/dL (ref 7.0–26.0)
CO2: 27 meq/L (ref 22–29)
CREATININE: 0.9 mg/dL (ref 0.7–1.3)
Calcium: 10.2 mg/dL (ref 8.4–10.4)
Chloride: 101 mEq/L (ref 98–109)
EGFR: 89 mL/min/{1.73_m2} — ABNORMAL LOW (ref >90)
GLUCOSE: 154 mg/dL — AB (ref 70–140)
Potassium: 4.1 mEq/L (ref 3.5–5.1)
SODIUM: 141 meq/L (ref 136–145)
TOTAL PROTEIN: 6.9 g/dL (ref 6.4–8.3)

## 2016-07-23 LAB — CBC WITH DIFFERENTIAL/PLATELET
BASO%: 0.2 % (ref 0.0–2.0)
Basophils Absolute: 0 10*3/uL (ref 0.0–0.1)
EOS ABS: 0.3 10*3/uL (ref 0.0–0.5)
EOS%: 2.4 % (ref 0.0–7.0)
HCT: 39.6 % (ref 38.4–49.9)
HEMOGLOBIN: 12.6 g/dL — AB (ref 13.0–17.1)
LYMPH%: 6.2 % — AB (ref 14.0–49.0)
MCH: 28.3 pg (ref 27.2–33.4)
MCHC: 31.8 g/dL — ABNORMAL LOW (ref 32.0–36.0)
MCV: 89 fL (ref 79.3–98.0)
MONO#: 0.7 10*3/uL (ref 0.1–0.9)
MONO%: 7.1 % (ref 0.0–14.0)
NEUT%: 84.1 % — ABNORMAL HIGH (ref 39.0–75.0)
NEUTROS ABS: 8.8 10*3/uL — AB (ref 1.5–6.5)
Platelets: 187 10*3/uL (ref 140–400)
RBC: 4.45 10*6/uL (ref 4.20–5.82)
RDW: 15.3 % — ABNORMAL HIGH (ref 11.0–14.6)
WBC: 10.4 10*3/uL — AB (ref 4.0–10.3)
lymph#: 0.7 10*3/uL — ABNORMAL LOW (ref 0.9–3.3)

## 2016-07-23 MED ORDER — PROCHLORPERAZINE MALEATE 10 MG PO TABS
ORAL_TABLET | ORAL | Status: AC
  Filled 2016-07-23: qty 1

## 2016-07-23 MED ORDER — PROCHLORPERAZINE MALEATE 10 MG PO TABS
10.0000 mg | ORAL_TABLET | Freq: Once | ORAL | Status: AC
  Administered 2016-07-23: 10 mg via ORAL

## 2016-07-23 MED ORDER — DIPHENHYDRAMINE HCL 25 MG PO CAPS
ORAL_CAPSULE | ORAL | Status: AC
  Filled 2016-07-23: qty 1

## 2016-07-23 MED ORDER — KETOROLAC TROMETHAMINE 60 MG/2ML IM SOLN
INTRAMUSCULAR | Status: AC
  Filled 2016-07-23: qty 2

## 2016-07-23 MED ORDER — KETOROLAC TROMETHAMINE 60 MG/2ML IM SOLN
60.0000 mg | Freq: Once | INTRAMUSCULAR | Status: AC
  Administered 2016-07-23: 60 mg via INTRAMUSCULAR

## 2016-07-23 MED ORDER — DIPHENHYDRAMINE HCL 25 MG PO TABS
25.0000 mg | ORAL_TABLET | Freq: Once | ORAL | Status: AC
  Administered 2016-07-23: 25 mg via ORAL
  Filled 2016-07-23: qty 1

## 2016-07-23 MED ORDER — KETOROLAC TROMETHAMINE 60 MG/2ML IM SOLN: 60.0000 mg | Freq: Once | INTRAMUSCULAR | Status: DC

## 2016-07-23 MED ORDER — GADOBENATE DIMEGLUMINE 529 MG/ML IV SOLN
20.0000 mL | Freq: Once | INTRAVENOUS | Status: AC | PRN
  Administered 2016-07-23: 17 mL via INTRAVENOUS

## 2016-07-23 MED ORDER — KETOROLAC TROMETHAMINE 30 MG/ML IJ SOLN: 60.0000 mg | Freq: Once | INTRAMUSCULAR | Status: DC

## 2016-07-23 NOTE — Progress Notes (Signed)
SYMPTOM MANAGEMENT CLINIC    Chief Complaint: Headache, shortness of breath  HPI:  Ernest [name] 60 y.o. male diagnosed with squamous cell lung cancer.  Patient is status post chemotherapy and immunotherapy.  Currently undergoing observation only.    No history exists.    Review of Systems  Constitutional: Positive for malaise/fatigue.  Respiratory: Positive for sputum production, shortness of breath and wheezing. Negative for cough and hemoptysis.   Gastrointestinal: Positive for nausea.  Neurological: Positive for headaches.  All other systems reviewed and are negative.   Past Medical History:  Diagnosis Date  . Anxiety   . CAD (coronary artery disease)    Left Main 30% stenosis, LAD 20 - 30 % stenosis, first and second diagonal branchesat 40 - 50%  stenosis with small arteries, circumflex had 30% stenosis in the large obtuse marginal, RCA at 70 - 80%  stenosis [not felt to be occlusive after evaluation with flow wire], distal 50 - 60% stenosis - [name][  . Cancer ([place])    lung  . Chronic insomnia   . COPD (chronic obstructive pulmonary disease) (HCC)    Dr. Baird Lyons  . COPD with asthma ([place]) 09/08/2007  . DDD (degenerative disc disease)   . Depression   . Diabetes mellitus without complication ([place]) 4/76/5465  . GERD (gastroesophageal reflux disease)   . Gout   . History of cardiovascular stress test    Myoview 7/16:  Diaphragmatic attenuation, no ischemia, EF 56%; Low Risk  . History of radiation therapy 11/10/13- 12/29/13   left lung 6600 cGy in 33 sessions  . Hx of colonoscopy   . Hyperlipidemia   . Hypertension    dr Percival Spanish  . Itching due to drug 03/19/2016  . Lung cancer ([place]) 10/04/13   LUL squamous cell lung cancer  . Nausea with vomiting 03/26/2016  . OSA (obstructive sleep apnea)    NPSG 09/10/10- AHI 11.3/hr  . PVD (peripheral vascular disease) ([place])    PTA/Stent right common iliac    Past Surgical History:  Procedure Laterality  Date  . ANGIOPLASTY    . ANTERIOR FUSION CERVICAL SPINE     cervical fusion  7 yrs ago ([facility])  . arm surgery     left elbow  . BACK SURGERY     lower  . c-spine surgery    . COLON SURGERY     '11 "Diverticulitis"  . COLONOSCOPY WITH PROPOFOL N/A 05/22/2015   Procedure: COLONOSCOPY WITH PROPOFOL;  Surgeon: Inda Castle, MD;  Location: [facility];  Service: Endoscopy;  Laterality: N/A;  . HIP SURGERY     left 'bone graft taken"  . PERIPHERAL VASCULAR CATHETERIZATION N/A 08/10/2015   Procedure: Lower Extremity Angiography;  Surgeon: Lorretta Harp, MD; Distal Ao OK, L-EIA stent OK, R-CIA 100% s/p overlapping 7 mm x 38 mm ICast stents, 50-60% R-CFA      . SHOULDER SURGERY     right  . SPINAL FUSION  03/05/2007   L4-L5  . VIDEO BRONCHOSCOPY WITH ENDOBRONCHIAL NAVIGATION N/A 10/04/2013   Procedure: VIDEO BRONCHOSCOPY WITH ENDOBRONCHIAL NAVIGATION;  Surgeon: Collene Gobble, MD;  Location: [facility];  Service: Thoracic;  Laterality: N/A;    has Hyperlipidemia with target LDL less than 70; GOUT; Depression with anxiety; Obstructive sleep apnea; Essential hypertension; Peripheral vascular disease ([place]); COPD mixed type ([place]); GERD; Preventative health care; Non-small cell carcinoma of lung, right ([place]); Chronic respiratory failure with hypoxia ([place]); Chronic respiratory failure with hypercapnia ([place]); Chronic pain syndrome;  Diabetes mellitus without complication ([place]); Coronary artery disease involving native coronary artery of native heart without angina pectoris; 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    is allergic to carboplatin.    Medication List       Accurate as of 07/23/16  4:38 PM. Always use your most recent med list.          ADVAIR DISKUS 500-50 MCG/DOSE Aepb Generic drug:  Fluticasone-Salmeterol 1 PUFF  THEN RINSE MOUTH, TWICE DAILY MAINTENANCE   ARIPiprazole 2 MG tablet Commonly known as:  ABILIFY Take 2 mg by mouth at bedtime.   Armodafinil 250 MG tablet Take 250 mg by mouth every morning.   aspirin 81 MG EC tablet TAKE 1 TABLET BY MOUTH EVERY DAY   atorvastatin 20 MG tablet Commonly known as:  LIPITOR TAKE 1 TABLET EVERY DAY   BELSOMRA 20 MG Tabs Generic drug:  Suvorexant Take 20 mg by mouth at bedtime as needed (for sleep). Reported on 03/29/2016   clonazePAM 1 MG tablet Commonly known as:  KLONOPIN Take 2 mg by mouth at bedtime as needed for anxiety. Reported on 01/16/2016   clopidogrel 75 MG tablet Commonly known as:  PLAVIX Take 1 tablet (75 mg total) by mouth daily.   dextromethorphan-guaiFENesin 30-600 MG 12hr tablet Commonly known as:  MUCINEX DM Take 1 tablet by mouth 2 (two) times daily as needed for cough (congestion).   esomeprazole 40 MG capsule Commonly known as:  NEXIUM TAKE ONE CAPSULE BY MOUTH EVERY DAY   feeding supplement (ENSURE ENLIVE) Liqd Take 237 mLs by mouth 2 (two) times daily between meals.   FLORA-Q Caps capsule Take 1 capsule by mouth daily.   FLUoxetine 40 MG capsule Commonly known as:  PROZAC Take 40 mg by mouth every morning.   gabapentin 100 MG capsule Commonly known as:  NEURONTIN Take 100 mg by mouth daily.   glipiZIDE 5 MG tablet Commonly known as:  GLUCOTROL Take 0.5 tablets (2.5 mg total) by mouth daily before breakfast.   KLOR-CON M20 20 MEQ tablet Generic drug:  potassium chloride SA TAKE 1 TABLET BY MOUTH EVERY MORNING   Methylnaltrexone Bromide 150 MG Tabs Commonly known as:  RELISTOR Take 3 tablets by mouth every morning.   mirtazapine 45 MG tablet Commonly known as:  REMERON Take 1 tablet (45 mg total) by mouth at bedtime.   morphine 15 MG tablet Commonly known as:  MSIR Take 1 tablet (15 mg total) by mouth every 4 (four) hours as needed for severe pain.   morphine 30 MG 12 hr tablet Commonly known as:   MS CONTIN Take 1 tablet (30 mg total) by mouth every 12 (twelve) hours.   nitroGLYCERIN 0.4 MG SL tablet Commonly known as:  NITROSTAT Place 1 tablet (0.4 mg total) under the tongue every 5 (five) minutes as needed for chest pain.   ondansetron 8 MG tablet Commonly known as:  ZOFRAN TAKE 1 TABLET (8 MG TOTAL) BY MOUTH EVERY 8 (EIGHT) HOURS AS NEEDED FOR NAUSEA OR VOMITING.   OXYGEN Inhale 2 L into the lungs daily.   prochlorperazine 10 MG tablet Commonly known as:  COMPAZINE TAKE 1 TABLET EVERY 6 HOURS AS NEEDED FOR NAUSEA AND VOMITING   scopolamine 1 MG/3DAYS Commonly known as:  TRANSDERM-SCOP Place 1 patch (1.5 mg total) onto the skin every 3 (three) days.   theophylline 400 MG 24 hr  tablet Commonly known as:  UNIPHYL TAKE 1 TABLET EVERY DAY   Tiotropium Bromide Monohydrate 1.25 MCG/ACT Aers Commonly known as:  SPIRIVA RESPIMAT Inhale 2 puffs into the lungs daily.   VENTOLIN HFA 108 (90 Base) MCG/ACT inhaler Generic drug:  albuterol INHALE 2 PUFFS INTO THE LUNGS 4 TIMES DAILY AS NEEDED FOR WHEEZING   albuterol (2.5 MG/3ML) 0.083% nebulizer solution Commonly known as:  PROVENTIL INHALE 1 VIAL IN NEBULIZER EVERY 4 HOURS AS NEEDED FOR WHEEZING OR SHORTNESS OF BREATH        PHYSICAL EXAMINATION  Oncology Vitals 07/23/2016 07/23/2016  Height 168 cm 168 cm  Weight 82.555 kg 82.918 kg  Weight (lbs) 182 lbs 182 lbs 13 oz  BMI (kg/m2) 29.38 kg/m2 29.5 kg/m2  Temp - 97.9  Pulse - 81  Resp - 19  SpO2 - 98  BSA (m2) 1.96 m2 1.96 m2   BP Readings from Last 2 Encounters:  07/23/16 137/61  07/18/16 120/70    Physical Exam  Constitutional: He is oriented to person, place, and time and well-developed, well-nourished, and in no distress.  HENT:  Head: Normocephalic and atraumatic.  Mouth/Throat: Oropharynx is clear and moist.  Eyes: Conjunctivae and EOM are normal. Pupils are equal, round, and reactive to light. Right eye exhibits no discharge. Left eye exhibits no  discharge. No scleral icterus.  Neck: Normal range of motion. Neck supple. No JVD present. No tracheal deviation present. No thyromegaly present.  Cardiovascular: Normal rate, regular rhythm, normal heart sounds and intact distal pulses.   Pulmonary/Chest: Effort normal and breath sounds normal. No respiratory distress. He has no wheezes. He has no rales. He exhibits no tenderness.  Abdominal: Soft. Bowel sounds are normal. He exhibits no distension and no mass. There is no tenderness. There is no rebound and no guarding.  Musculoskeletal: Normal range of motion. He exhibits no edema, tenderness or deformity.  Lymphadenopathy:    He has no cervical adenopathy.  Neurological: He is alert and oriented to person, place, and time. Gait normal.  Skin: Skin is warm and dry. No rash noted. No erythema. No pallor.  Psychiatric: Affect normal.  Nursing note and vitals reviewed.   LABORATORY DATA:. Appointment on 07/23/2016  Component Date Value Ref Range Status  . WBC 07/23/2016 10.4* 4.0 - 10.3 10e3/uL Final  . NEUT# 07/23/2016 8.8* 1.5 - 6.5 10e3/uL Final  . HGB 07/23/2016 12.6* 13.0 - 17.1 g/dL Final  . HCT 07/23/2016 39.6  38.4 - 49.9 % Final  . Platelets 07/23/2016 187  140 - 400 10e3/uL Final  . MCV 07/23/2016 89.0  79.3 - 98.0 fL Final  . MCH 07/23/2016 28.3  27.2 - 33.4 pg Final  . MCHC 07/23/2016 31.8* 32.0 - 36.0 g/dL Final  . RBC 07/23/2016 4.45  4.20 - 5.82 10e6/uL Final  . RDW 07/23/2016 15.3* 11.0 - 14.6 % Final  . lymph# 07/23/2016 0.7* 0.9 - 3.3 10e3/uL Final  . MONO# 07/23/2016 0.7  0.1 - 0.9 10e3/uL Final  . Eosinophils Absolute 07/23/2016 0.3  0.0 - 0.5 10e3/uL Final  . Basophils Absolute 07/23/2016 0.0  0.0 - 0.1 10e3/uL Final  . NEUT% 07/23/2016 84.1* 39.0 - 75.0 % Final  . LYMPH% 07/23/2016 6.2* 14.0 - 49.0 % Final  . MONO% 07/23/2016 7.1  0.0 - 14.0 % Final  . EOS% 07/23/2016 2.4  0.0 - 7.0 % Final  . BASO% 07/23/2016 0.2  0.0 - 2.0 % Final  . Sodium 07/23/2016 141   136 - 145 mEq/L  Final  . Potassium 07/23/2016 4.1  3.5 - 5.1 mEq/L Final  . Chloride 07/23/2016 101  98 - 109 mEq/L Final  . CO2 07/23/2016 27  22 - 29 mEq/L Final  . Glucose 07/23/2016 154* 70 - 140 mg/dl Final  . BUN 07/23/2016 9.3  7.0 - 26.0 mg/dL Final  . Creatinine 07/23/2016 0.9  0.7 - 1.3 mg/dL Final  . Total Bilirubin 07/23/2016 0.37  0.20 - 1.20 mg/dL Final  . Alkaline Phosphatase 07/23/2016 115  40 - 150 U/L Final  . AST 07/23/2016 15  5 - 34 U/L Final  . ALT 07/23/2016 11  0 - 55 U/L Final  . Total Protein 07/23/2016 6.9  6.4 - 8.3 g/dL Final  . Albumin 07/23/2016 3.3* 3.5 - 5.0 g/dL Final  . Calcium 07/23/2016 10.2  8.4 - 10.4 mg/dL Final  . Anion Gap 07/23/2016 13* 3 - 11 mEq/L Final  . EGFR 07/23/2016 89* >90 ml/min/1.73 m2 Final    RADIOGRAPHIC STUDIES: Mr Jeri Cos Wo Contrast  Result Date: 07/23/2016 CLINICAL DATA:  Non-small cell lung cancer. Headaches and visual changes. Restaging. EXAM: MRI HEAD WITHOUT AND WITH CONTRAST TECHNIQUE: Multiplanar, multiecho pulse sequences of the brain and surrounding structures were obtained without and with intravenous contrast. CONTRAST:  91m MULTIHANCE GADOBENATE DIMEGLUMINE 529 MG/ML IV SOLN COMPARISON:  10/19/2013 FINDINGS: There is a new punctate 3 mm focus of elevated diffusion-weighted signal involving the left precentral gyrus without clearly reduced ADC. There is no associated enhancement or definite T2 signal abnormality in this location. No diffusion abnormality is seen elsewhere. There is no evidence of intracranial hemorrhage, mass, midline shift, or extra-axial fluid collection. Minimal cerebral white matter T2 signal changes are within normal limits for age. No abnormal brain parenchymal or meningeal enhancement is identified. There is mild cerebral atrophy. Orbits are unremarkable. There is minimal left sphenoid sinus mucosal thickening. The mastoid air cells are clear. Major intracranial vascular flow voids are unchanged, with  a markedly hypoplastic right vertebral artery again noted. Subcentimeter T2 hyperintense/ cystic focus just posterior to the left parotid gland is unchanged and benign in appearance. Mild diffuse heterogeneity of the bone marrow signal of the skull is unchanged. No destructive osseous lesion is identified. IMPRESSION: 1. Punctate focus of diffusion abnormality in the left precentral gyrus which may reflect a tiny subacute embolic infarct. 2. No enhancing lesions to indicate metastatic disease. Electronically Signed   By: ALogan BoresM.D.   On: 07/23/2016 15:27    ASSESSMENT/PLAN:    Non-small cell carcinoma of lung, right ([facility] Patient last received Nivolumab immunotherapy on 05/27/2016.  He is currently undergoing observation only.  [facility] and has been informed that he is not a candidate for clinical research trial at this time.  [facility] of an 8 day history of headaches.  He has also had some vision changes that come and go.  He is considered to the right at times as well.  He denies any other neurological problems whatsoever.  He also states that he suffers with chronic nausea; but has no vomiting.  Patient also reports increased shortness of breath as well; and has a productive cough with clear/thick secretions.  He has also noted some increased wheezing as well.  He has nebulizers at home that he uses as needed.  Also, patient states that he typically wears O2 at 2 L nasal cannula on a 24/7 basis-but he does  not use to wear oxygen when he is at the cancer center.  See further notes for details.  Exam today reveals breath sounds essentially clear bilaterally; with no cough or wheeze.  Patient does not appear short of breath.  Vital signs were stable and O2 sat was 98% on room air.  Patient was afebrile with temperature 97.9.  [name] in to review all with patient as well.  He recommended that patient  undergo a brain MRI for further evaluation of possible metastasis.  Also, will order a restaging CT with contrast of the chest/abdomen/pelvis to be obtained tomorrow morning as well.  Patient is scheduled to return for labs and a follow-up visit on 08/06/2016.  Headache Patient presented to the [facility] today with complaint of an 8 day history of headaches.  He has also had some vision changes that come and go.  He is considered to the right at times as well.  He denies any other neurological problems whatsoever.  He also states that he suffers with chronic nausea; but has no vomiting.  Patient will undergo a stat brain MRI this afternoon for further evaluation of any possible brain metastasis.  Also, patient states that he already has pain medication at home to take on an as-needed basis.  In the meantime-patient was advised to call/return or go directly to the emergency department for any worsening symptoms whatsoever. __________________________________  Update: MRI of the brain obtained this afternoon revealed:   IMPRESSION: 1. Punctate focus of diffusion abnormality in the left precentral gyrus which may reflect a tiny subacute embolic infarct. 2. No enhancing lesions to indicate metastatic disease.  Reviewed the range MRI results with [name]; and then reviewed all results with both the patient and his wife in detail.  Confirmed that patient is taking a aspirin on a daily basis as a prophylaxis.  Since brain MRI did not show any brain mass/metastatic disease or hemorrhage-will treat patient for possible migraine-type headache.  Patient will be given Toradol 60 mg IM, Benadryl 25 mg orally, and Compazine 10 mg orally.  He was also encouraged to continue to take his regular pain medicines at home as previously directed.  Note: Patient has a cardiac history and placement of stents in the past.  He also has a history of hypertension.  Therefore-will forego trying patient with  Imitrex for the time being.  Also, advised patient to call/return to the symptom management clinic tomorrow if he continues with his headache.  Advised patient would be in need to consider IV fluid rehydration; along with the Toradol/Benadryl/Compazine cocktail.  Patient was also encouraged to go directly to the emergency department overnight.  If he develops any worsening symptoms whatsoever.          Patient stated understanding of all instructions; and was in agreement with this plan of care. The patient knows to call the clinic with any problems, questions or concerns.   Total time spent with patient was 40 minutes;  with greater than 75 percent of that time spent in face to face counseling regarding patient's symptoms,  and coordination of care and follow up.  Disclaimer:This dictation was prepared with Dragon/digital dictation along with [employer]. Any transcriptional errors that result from this process are unintentional.  Drue Second, NP 07/23/2016  Plan  ADDENDUM: Hematology/Oncology Attending: I had a face to face encounter with the patient. I recommended his care plan. This is a very pleasant 60 years old white male with recurrent non-small cell lung cancer,  squamous cell carcinoma recently treated with systemic chemotherapy with Abraxane followed by Nivolumab but unfortunately continues to have disease progression. The patient was seen recently at [facility] for evaluation and second appear in but unfortunately he is not a candidate for any clinical trial. He came today complaining of persistent headache in addition to the baseline shortness breath. I recommended for the patient to have MRI of the brain performed today to rule out any brain metastasis. We will also arrange for the patient to have repeat CT scan of the chest, abdomen and pelvis for restaging of his disease before consideration of any further systemic treatment. He may be  considered for the clinical trial S1400. We will check his eligibility before proceeding with the next option of his chemotherapy. The MRI of the brain performed later today showed punctate focus of diffusion abnormality in the left base central chylous questionable for a tiny subacute embolus infarct. We advised the patient to stay on aspirin. We will consider referral to neurology if needed. The patient has follow-up appointment scheduled with me in less than 2 weeks for reevaluation. He was advised to call immediately if he has any concerning symptoms in the interval.  Disclaimer: This note was dictated with voice recognition software. Similar sounding words can inadvertently be transcribed and may be missed upon review. Eilleen Kempf., MD 07/24/16

## 2016-07-23 NOTE — Assessment & Plan Note (Signed)
Patient last received Nivolumab immunotherapy on 05/27/2016.  He is currently undergoing observation only.  He has been evaluated at [facility] as well; and has been informed that he is not a candidate for clinical research trial at this time.  Patient presented to the [facility] today with complaint of an 8 day history of headaches.  He has also had some vision changes that come and go.  He is considered to the right at times as well.  He denies any other neurological problems whatsoever.  He also states that he suffers with chronic nausea; but has no vomiting.  Patient also reports increased shortness of breath as well; and has a productive cough with clear/thick secretions.  He has also noted some increased wheezing as well.  He has nebulizers at home that he uses as needed.  Also, patient states that he typically wears O2 at 2 L nasal cannula on a 24/7 basis-but he does not use to wear oxygen when he is at the cancer center.  See further notes for details.  Exam today reveals breath sounds essentially clear bilaterally; with no cough or wheeze.  Patient does not appear short of breath.  Vital signs were stable and O2 sat was 98% on room air.  Patient was afebrile with temperature 97.9.  [name] in to review all with patient as well.  He recommended that patient undergo a brain MRI for further evaluation of possible metastasis.  Also, will order a restaging CT with contrast of the chest/abdomen/pelvis to be obtained tomorrow morning as well.  Patient is scheduled to return for labs and a follow-up visit on 08/06/2016.

## 2016-07-23 NOTE — Assessment & Plan Note (Addendum)
Patient presented to the [facility] today with complaint of an 8 day history of headaches.  He has also had some vision changes that come and go.  He is considered to the right at times as well.  He denies any other neurological problems whatsoever.  He also states that he suffers with chronic nausea; but has no vomiting.  Patient will undergo a stat brain MRI this afternoon for further evaluation of any possible brain metastasis.  Also, patient states that he already has pain medication at home to take on an as-needed basis.  In the meantime-patient was advised to call/return or go directly to the emergency department for any worsening symptoms whatsoever. __________________________________  Update: MRI of the brain obtained this afternoon revealed:   IMPRESSION: 1. Punctate focus of diffusion abnormality in the left precentral gyrus which may reflect a tiny subacute embolic infarct. 2. No enhancing lesions to indicate metastatic disease.  Reviewed the range MRI results with [name]; and then reviewed all results with both the patient and his wife in detail.  Confirmed that patient is taking a aspirin on a daily basis as a prophylaxis.  Since brain MRI did not show any brain mass/metastatic disease or hemorrhage-will treat patient for possible migraine-type headache.  Patient will be given Toradol 60 mg IM, Benadryl 25 mg orally, and Compazine 10 mg orally.  He was also encouraged to continue to take his regular pain medicines at home as previously directed.  Note: Patient has a cardiac history and placement of stents in the past.  He also has a history of hypertension.  Therefore-will forego trying patient with Imitrex for the time being.  Also, advised patient to call/return to the symptom management clinic tomorrow if he continues with his headache.  Advised patient would be in need to consider IV fluid rehydration; along with the Toradol/Benadryl/Compazine cocktail.  Patient was  also encouraged to go directly to the emergency department overnight.  If he develops any worsening symptoms whatsoever.

## 2016-07-24 ENCOUNTER — Ambulatory Visit (HOSPITAL_COMMUNITY)
Admission: RE | Admit: 2016-07-24 | Discharge: 2016-07-24 | Disposition: A | Discharge status: Home / Self Care | Payer: Commercial Managed Care - HMO | Source: Ambulatory Visit | Attending: Nurse Practitioner | Admitting: Nurse Practitioner

## 2016-07-24 ENCOUNTER — Telehealth: Payer: Self-pay | Admitting: Nurse Practitioner

## 2016-07-24 ENCOUNTER — Encounter (HOSPITAL_COMMUNITY): Payer: Self-pay

## 2016-07-24 DIAGNOSIS — R591 Generalized enlarged lymph nodes: Secondary | ICD-10-CM | POA: Diagnosis not present

## 2016-07-24 DIAGNOSIS — J449 Chronic obstructive pulmonary disease, unspecified: Secondary | ICD-10-CM | POA: Diagnosis not present

## 2016-07-24 DIAGNOSIS — C3491 Malignant neoplasm of unspecified part of right bronchus or lung: Secondary | ICD-10-CM | POA: Diagnosis not present

## 2016-07-24 DIAGNOSIS — D7389 Other diseases of spleen: Secondary | ICD-10-CM | POA: Diagnosis not present

## 2016-07-24 DIAGNOSIS — J9601 Acute respiratory failure with hypoxia: Secondary | ICD-10-CM | POA: Diagnosis not present

## 2016-07-24 DIAGNOSIS — C342 Malignant neoplasm of middle lobe, bronchus or lung: Secondary | ICD-10-CM | POA: Diagnosis not present

## 2016-07-24 DIAGNOSIS — C3490 Malignant neoplasm of unspecified part of unspecified bronchus or lung: Secondary | ICD-10-CM | POA: Diagnosis not present

## 2016-07-24 DIAGNOSIS — R519 Headache, unspecified: Secondary | ICD-10-CM

## 2016-07-24 DIAGNOSIS — R51 Headache: Principal | ICD-10-CM

## 2016-07-24 MED ORDER — IOPAMIDOL (ISOVUE-300) INJECTION 61%
100.0000 mL | Freq: Once | INTRAVENOUS | Status: AC | PRN
Start: 2016-07-24 — End: 2016-07-24
  Administered 2016-07-24: 100 mL via INTRAVENOUS

## 2016-07-24 NOTE — Telephone Encounter (Signed)
Called patient to review his brain MRI in his CT chest/abdomen/pelvis results.  Also, patient states that his headache has completely resolved since receiving treatment for his headache yesterday while in the symptom management clinic.  Patient has plans to return to further review his scan results on 08/06/2016 with [name].  Patient is to call in the interim with any new worries or concerns whatsoever.

## 2016-07-25 ENCOUNTER — Ambulatory Visit: Payer: Commercial Managed Care - HMO | Admitting: Internal Medicine

## 2016-08-01 ENCOUNTER — Encounter: Payer: Self-pay | Admitting: Internal Medicine

## 2016-08-01 ENCOUNTER — Ambulatory Visit (INDEPENDENT_AMBULATORY_CARE_PROVIDER_SITE_OTHER): Payer: Commercial Managed Care - HMO | Admitting: Internal Medicine

## 2016-08-01 ENCOUNTER — Telehealth: Payer: Self-pay | Admitting: Medical Oncology

## 2016-08-01 VITALS — BP 128/60 | HR 97 | Temp 98.3°F | Resp 20 | Ht 66.0 in | Wt 182.0 lb

## 2016-08-01 DIAGNOSIS — G44209 Tension-type headache, unspecified, not intractable: Secondary | ICD-10-CM | POA: Diagnosis not present

## 2016-08-01 DIAGNOSIS — F5105 Insomnia due to other mental disorder: Secondary | ICD-10-CM

## 2016-08-01 DIAGNOSIS — Z23 Encounter for immunization: Secondary | ICD-10-CM

## 2016-08-01 DIAGNOSIS — F409 Phobic anxiety disorder, unspecified: Secondary | ICD-10-CM | POA: Diagnosis not present

## 2016-08-01 DIAGNOSIS — F418 Other specified anxiety disorders: Secondary | ICD-10-CM

## 2016-08-01 MED ORDER — TRAZODONE HCL 150 MG PO TABS: 150.0000 mg | ORAL_TABLET | Freq: Every day | ORAL | 3 refills | Status: DC

## 2016-08-01 MED ORDER — IBUPROFEN 600 MG PO TABS: 600.0000 mg | ORAL_TABLET | Freq: Three times a day (TID) | ORAL | 3 refills | Status: DC | PRN

## 2016-08-01 NOTE — Progress Notes (Signed)
Pre visit review using our clinic review tool, if applicable. No additional management support is needed unless otherwise documented below in the visit note. 

## 2016-08-01 NOTE — Progress Notes (Signed)
Subjective:  Patient ID: Ernest [name], male    DOB: [birth year]  Age: 60 y.o. MRN: [id number]  CC: Headache and Depression   HPI Ernest [name] presents for follow-up on constant dull headache around the right frontal region and his right eyeball onto his right scalp for several weeks now. He was seen elsewhere about a week ago and was given an injection of Toradol which he says reduced the pain. 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 He also complains of worsening insomnia, anxiety, and panic. He is getting moderate symptom relief with clonazepam.  Outpatient Medications Prior to Visit  Medication Sig Dispense Refill  . ADVAIR DISKUS 500-50 MCG/DOSE AEPB 1 PUFF THEN RINSE MOUTH, TWICE DAILY MAINTENANCE 60 each 2  . albuterol (PROVENTIL) (2.5 MG/3ML) 0.083% nebulizer solution INHALE 1 VIAL IN NEBULIZER EVERY 4 HOURS AS NEEDED FOR WHEEZING OR SHORTNESS OF BREATH 300 mL 2  . ARIPiprazole (ABILIFY) 2 MG tablet Take 2 mg by mouth at bedtime.   4  . Armodafinil 250 MG tablet Take 250 mg by mouth every morning.  5  . aspirin 81 MG EC tablet TAKE 1 TABLET BY MOUTH EVERY DAY 30 tablet 5  . atorvastatin (LIPITOR) 20 MG tablet TAKE 1 TABLET EVERY DAY (Patient taking differently: TAKE 20 MG  EVERY DAY IN THE EVENING) 90 tablet 3  . BELSOMRA 20 MG TABS Take 20 mg by mouth at bedtime as needed (for sleep). Reported on 03/29/2016  4  . clonazePAM (KLONOPIN) 1 MG tablet Take 2 mg by mouth at bedtime as needed for anxiety. Reported on 01/16/2016    . clopidogrel (PLAVIX) 75 MG tablet Take 1 tablet (75 mg total) by mouth daily. 90 tablet 3  . dextromethorphan-guaiFENesin (MUCINEX DM) 30-600 MG 12hr tablet Take 1 tablet by mouth 2 (two) times daily as needed for cough (congestion).    [name] esomeprazole (NEXIUM) 40 MG capsule TAKE ONE CAPSULE BY MOUTH EVERY DAY (Patient taking differently:  TAKE 40 MG  BY MOUTH EVERY DAY) 90 capsule 3  . feeding supplement, ENSURE ENLIVE, (ENSURE ENLIVE) LIQD Take 237 mLs by mouth 2 (two) times daily between meals. 237 mL 12  . FLORA-Q (FLORA-Q) CAPS capsule Take 1 capsule by mouth daily. 20 capsule 0  . FLUoxetine (PROZAC) 40 MG capsule Take 40 mg by mouth every morning.    . gabapentin (NEURONTIN) 100 MG capsule Take 100 mg by mouth daily.     [name] glipiZIDE (GLUCOTROL) 5 MG tablet Take 0.5 tablets (2.5 mg total) by mouth daily before breakfast. 30 tablet 5  . KLOR-CON M20 20 MEQ tablet TAKE 1 TABLET BY MOUTH EVERY MORNING 90 tablet 1  . Methylnaltrexone Bromide (RELISTOR) 150 MG TABS Take 3 tablets by mouth every morning. 90 tablet 11  . mirtazapine (REMERON) 45 MG tablet Take 1 tablet (45 mg total) by mouth at bedtime.    [name] morphine (MS CONTIN) 30 MG 12 hr tablet Take 1 tablet (30 mg total) by mouth every 12 (twelve) hours. 60 tablet 0  . morphine (MSIR) 15 MG tablet Take 1 tablet (15 mg total) by mouth every 4 (four) hours as needed for severe pain. 30 tablet 0  . ondansetron (ZOFRAN) 8 MG tablet TAKE 1 TABLET (8 MG TOTAL) BY MOUTH EVERY 8 (EIGHT) HOURS AS NEEDED FOR NAUSEA OR VOMITING. 20 tablet 0  . OXYGEN  Inhale 2 L into the lungs daily.     . prochlorperazine (COMPAZINE) 10 MG tablet TAKE 1 TABLET EVERY 6 HOURS AS NEEDED FOR NAUSEA AND VOMITING 30 tablet 1  . scopolamine (TRANSDERM-SCOP) 1 MG/3DAYS Place 1 patch (1.5 mg total) onto the skin every 3 (three) days. 4 patch 0  . theophylline (UNIPHYL) 400 MG 24 hr tablet TAKE 1 TABLET EVERY DAY (Patient taking differently: TAKE 400 MG BY MOUTH DAILY) 30 tablet 5  . Tiotropium Bromide Monohydrate (SPIRIVA RESPIMAT) 1.25 MCG/ACT AERS Inhale 2 puffs into the lungs daily. 4 g 11  . VENTOLIN HFA 108 (90 Base) MCG/ACT inhaler INHALE 2 PUFFS INTO THE LUNGS 4 TIMES DAILY AS NEEDED FOR WHEEZING 18 Inhaler 2  . nitroGLYCERIN (NITROSTAT) 0.4 MG SL tablet Place 1 tablet (0.4 mg total) under the tongue every 5  (five) minutes as needed for chest pain. (Patient not taking: Reported on 08/01/2016) 25 tablet 3   No facility-administered medications prior to visit.     ROS Review of Systems  Constitutional: Positive for fatigue. Negative for appetite change, diaphoresis and unexpected weight change.  Eyes: Negative.   Respiratory: Positive for cough and shortness of breath. Negative for choking, chest tightness, wheezing and stridor.   Cardiovascular: Negative.  Negative for chest pain, palpitations and leg swelling.  Gastrointestinal: Negative.  Negative for abdominal pain, blood in stool, constipation, diarrhea, nausea and vomiting.  Endocrine: Negative.   Genitourinary: Negative.   Musculoskeletal: Positive for neck pain. Negative for arthralgias, back pain, joint swelling and myalgias.  Skin: Negative.   Allergic/Immunologic: Negative.   Neurological: Positive for headaches. Negative for dizziness, tremors, seizures, syncope, facial asymmetry and numbness.  Hematological: Negative for adenopathy. Does not bruise/bleed easily.  Psychiatric/Behavioral: Positive for dysphoric mood and sleep disturbance. Negative for behavioral problems, confusion, decreased concentration, hallucinations, self-injury and suicidal ideas. The patient is nervous/anxious. The patient is not hyperactive.     Objective:  BP 128/60 (BP Location: Left Arm, Patient Position: Sitting, Cuff Size: Normal)   Pulse 97   Temp 98.3 F (36.8 C) (Oral)   Ht '5\' 6"'$  (1.676 m)   Wt 182 lb (82.6 kg)   SpO2 90%   BMI 29.38 kg/m   BP Readings from Last 3 Encounters:  08/01/16 128/60  07/23/16 137/61  07/18/16 120/70    Wt Readings from Last 3 Encounters:  08/01/16 182 lb (82.6 kg)  07/23/16 182 lb (82.6 kg)  07/23/16 182 lb 12.8 oz (82.9 kg)    Physical Exam  Constitutional: He is oriented to person, place, and time. No distress.  HENT:  Mouth/Throat: Oropharynx is clear and moist. No oropharyngeal exudate.  Eyes:  Conjunctivae are normal. Right eye exhibits no discharge. Left eye exhibits no discharge. No scleral icterus.  Neck: Neck supple. No JVD present. No tracheal deviation present. No thyromegaly present.  Cardiovascular: Normal rate, regular rhythm, normal heart sounds and intact distal pulses.  Exam reveals no gallop and no friction rub.   No murmur heard. Pulmonary/Chest: Effort normal and breath sounds normal. No stridor. No respiratory distress. He has no wheezes. He has no rales. He exhibits no tenderness.  Abdominal: Soft. Bowel sounds are normal. He exhibits no distension and no mass. There is no tenderness. There is no rebound and no guarding.  Musculoskeletal: Normal range of motion. He exhibits no edema, tenderness or deformity.  Lymphadenopathy:    He has no cervical adenopathy.  Neurological: He is alert and oriented to person, place, and time. He has  normal reflexes. He displays normal reflexes. No cranial nerve deficit. He exhibits normal muscle tone. Coordination normal.  Skin: Skin is warm and dry. No rash noted. He is not diaphoretic. No erythema. No pallor.  Psychiatric: His speech is normal and behavior is normal. Judgment and thought content normal. His mood appears anxious. His affect is not angry. He is not slowed and not withdrawn. Cognition and memory are normal. He exhibits a depressed mood. He expresses no homicidal and no suicidal ideation. He expresses no suicidal plans and no homicidal plans. He is attentive.  Vitals reviewed.   Lab Results  Component Value Date   WBC 10.4 (H) 07/23/2016   HGB 12.6 (L) 07/23/2016   HCT 39.6 07/23/2016   PLT 187 07/23/2016   GLUCOSE 154 (H) 07/23/2016   CHOL 164 10/05/2015   TRIG 174.0 (H) 10/05/2015   HDL 48.70 10/05/2015   LDLDIRECT 71.4 09/08/2012   LDLCALC 80 10/05/2015   ALT 11 07/23/2016   AST 15 07/23/2016   NA 141 07/23/2016   K 4.1 07/23/2016   CL 94 (L) 06/07/2016   CREATININE 0.9 07/23/2016   BUN 9.3 07/23/2016    CO2 27 07/23/2016   TSH 2.261 07/09/2016   PSA 0.64 07/18/2011   INR 1.14 04/21/2016   HGBA1C 8.1 (H) 04/22/2016   MICROALBUR 5.7 (H) 10/05/2015    Ct Chest W Contrast  Result Date: 07/24/2016 CLINICAL DATA:  Followup lung cancer EXAM: CT CHEST, ABDOMEN, AND PELVIS WITH CONTRAST TECHNIQUE: Multidetector CT imaging of the chest, abdomen and pelvis was performed following the standard protocol during bolus administration of intravenous contrast. CONTRAST:  171m ISOVUE-300 IOPAMIDOL (ISOVUE-300) INJECTION 61% COMPARISON:  06/07/2016 FINDINGS: CT CHEST FINDINGS Mediastinum/Lymph Nodes: The heart size appears normal. No pericardial effusion identified. There is aortic atherosclerosis noted. Calcification within the thoracic aorta as well as the RCA, LAD and left circumflex coronary artery noted. The trachea appears patent and is midline. Normal appearance of the esophagus. The index right paratracheal lymph node measures 3.9 cm, image 22 of series 2. Previously 2.0 cm. The index sub- carinal lymph node measures 3.1 cm, image 28 of series 2. Previously 1.9 cm. New right paratracheal lymph node measures 1.5 cm, image 16 of series 2. Lungs/Pleura: Small right pleural effusion unchanged. Moderate changes of centrilobular emphysema. Index right lower lobe lung mass measures 5.5 x 6.5 cm, image 39 of series 2. Previously 5.2 x 6.9 cm. Left upper lobe consolidation, fibrosis and cavitation is again noted and appears unchanged from previous exam. The index nodule within the left lower lobe measures 7 mm, image 102 of series 4. Previously this measured the same. Musculoskeletal: No chest wall mass or suspicious bone lesions identified. CT ABDOMEN PELVIS FINDINGS Hepatobiliary: No suspicious liver abnormality. The gallbladder is normal. No biliary dilatation. Pancreas: No mass, inflammatory changes, or other significant abnormality. Spleen: Low-attenuation lesion within the inferior spleen measures 5.1 cm, image 66 of  series 2. Unchanged from previous exam. Adrenals/Urinary Tract: The adrenal glands are normal. The kidneys scratch set normal appearance of the left kidney. There is a cyst within the lower pole the right kidney which measures 1.6 cm, image 74 of series 2. Unchanged from previous exam. The urinary bladder is normal. Stomach/Bowel: The stomach is within normal limits. The small bowel loops have a normal course and caliber. No obstruction. Normal appearance of the colon. The appendix is visualized and appears normal. Numerous distal colonic diverticula identified without acute inflammation. Vascular/Lymphatic: Aortic atherosclerosis noted. No upper abdominal  adenopathy. No pelvic or inguinal adenopathy. Reproductive: The prostate gland and seminal vesicles are normal. Other: There is no ascites or focal fluid collections within the abdomen or pelvis. Musculoskeletal: No aggressive lytic or sclerotic bone lesion. Previous posterior fixation of the lumbar spine. IMPRESSION: 1. Interval progression of mediastinal adenopathy. 2. Index mass within the right lower lobe is stable compared with previous exam. 3. Low-attenuation lesion within the spleen is unchanged from previous exam. Electronically Signed   By: Kerby Moors M.D.   On: 07/24/2016 08:48   Ct Abdomen Pelvis W Contrast  Result Date: 07/24/2016 CLINICAL DATA:  Followup lung cancer EXAM: CT CHEST, ABDOMEN, AND PELVIS WITH CONTRAST TECHNIQUE: Multidetector CT imaging of the chest, abdomen and pelvis was performed following the standard protocol during bolus administration of intravenous contrast. CONTRAST:  170m ISOVUE-300 IOPAMIDOL (ISOVUE-300) INJECTION 61% COMPARISON:  06/07/2016 FINDINGS: CT CHEST FINDINGS Mediastinum/Lymph Nodes: The heart size appears normal. No pericardial effusion identified. There is aortic atherosclerosis noted. Calcification within the thoracic aorta as well as the RCA, LAD and left circumflex coronary artery noted. The trachea  appears patent and is midline. Normal appearance of the esophagus. The index right paratracheal lymph node measures 3.9 cm, image 22 of series 2. Previously 2.0 cm. The index sub- carinal lymph node measures 3.1 cm, image 28 of series 2. Previously 1.9 cm. New right paratracheal lymph node measures 1.5 cm, image 16 of series 2. Lungs/Pleura: Small right pleural effusion unchanged. Moderate changes of centrilobular emphysema. Index right lower lobe lung mass measures 5.5 x 6.5 cm, image 39 of series 2. Previously 5.2 x 6.9 cm. Left upper lobe consolidation, fibrosis and cavitation is again noted and appears unchanged from previous exam. The index nodule within the left lower lobe measures 7 mm, image 102 of series 4. Previously this measured the same. Musculoskeletal: No chest wall mass or suspicious bone lesions identified. CT ABDOMEN PELVIS FINDINGS Hepatobiliary: No suspicious liver abnormality. The gallbladder is normal. No biliary dilatation. Pancreas: No mass, inflammatory changes, or other significant abnormality. Spleen: Low-attenuation lesion within the inferior spleen measures 5.1 cm, image 66 of series 2. Unchanged from previous exam. Adrenals/Urinary Tract: The adrenal glands are normal. The kidneys scratch set normal appearance of the left kidney. There is a cyst within the lower pole the right kidney which measures 1.6 cm, image 74 of series 2. Unchanged from previous exam. The urinary bladder is normal. Stomach/Bowel: The stomach is within normal limits. The small bowel loops have a normal course and caliber. No obstruction. Normal appearance of the colon. The appendix is visualized and appears normal. Numerous distal colonic diverticula identified without acute inflammation. Vascular/Lymphatic: Aortic atherosclerosis noted. No upper abdominal adenopathy. No pelvic or inguinal adenopathy. Reproductive: The prostate gland and seminal vesicles are normal. Other: There is no ascites or focal fluid  collections within the abdomen or pelvis. Musculoskeletal: No aggressive lytic or sclerotic bone lesion. Previous posterior fixation of the lumbar spine. IMPRESSION: 1. Interval progression of mediastinal adenopathy. 2. Index mass within the right lower lobe is stable compared with previous exam. 3. Low-attenuation lesion within the spleen is unchanged from previous exam. Electronically Signed   By: TKerby MoorsM.D.   On: 07/24/2016 08:48    Assessment & Plan:   RTadashiwas seen today for headache and depression.  Diagnoses and all orders for this visit:  Depression with anxiety- I will add trazodone to the mirtazapine and clonazepam for serotonin receptor modulation. -     traZODone (DESYREL) 150  MG tablet; Take 1 tablet (150 mg total) by mouth at bedtime.  Muscle contraction headache- since he had a good response to her Toradol injection a week ago will continue to control the pain with an anti-inflammatory as needed. I also think adding trazodone may help reduce his pain. -     traZODone (DESYREL) 150 MG tablet; Take 1 tablet (150 mg total) by mouth at bedtime. -     ibuprofen (ADVIL,MOTRIN) 600 MG tablet; Take 1 tablet (600 mg total) by mouth every 8 (eight) hours as needed.  Insomnia due to anxiety and fear- I will add trazodone to the rest of his regimen, he will continue Belsmra as needed for insomnia. -     traZODone (DESYREL) 150 MG tablet; Take 1 tablet (150 mg total) by mouth at bedtime.  Need for prophylactic vaccination and inoculation against influenza -     Flu Vaccine QUAD 36+ mos IM   I am having Mr. Jastrzebski start on traZODone and ibuprofen. I am also having him maintain his clonazePAM, nitroGLYCERIN, mirtazapine, FLUoxetine, gabapentin, atorvastatin, clopidogrel, BELSOMRA, OXYGEN, dextromethorphan-guaiFENesin, Armodafinil, theophylline, Tiotropium Bromide Monohydrate, FLORA-Q, ADVAIR DISKUS, esomeprazole, glipiZIDE, feeding supplement (ENSURE ENLIVE), VENTOLIN HFA,  ARIPiprazole, ondansetron, albuterol, scopolamine, aspirin, morphine, morphine, Methylnaltrexone Bromide, prochlorperazine, KLOR-CON M20, isosorbide mononitrate, metoprolol succinate, and pantoprazole.  Meds ordered this encounter  Medications  . isosorbide mononitrate (IMDUR) 120 MG 24 hr tablet  . metoprolol succinate (TOPROL-XL) 25 MG 24 hr tablet  . pantoprazole (PROTONIX) 40 MG tablet    Sig: TAKE 1 TABLET (40 MG TOTAL) BY MOUTH DAILY.    Refill:  11  . traZODone (DESYREL) 150 MG tablet    Sig: Take 1 tablet (150 mg total) by mouth at bedtime.    Dispense:  90 tablet    Refill:  3  . ibuprofen (ADVIL,MOTRIN) 600 MG tablet    Sig: Take 1 tablet (600 mg total) by mouth every 8 (eight) hours as needed.    Dispense:  90 tablet    Refill:  3     Follow-up: Return in about 3 months (around 10/31/2016).  [name], MD

## 2016-08-01 NOTE — Telephone Encounter (Signed)
faxed [facility] notes to pts PCP.I left message for to to f/u with PCP about headaches.

## 2016-08-01 NOTE — Patient Instructions (Signed)

## 2016-08-05 ENCOUNTER — Other Ambulatory Visit: Payer: Commercial Managed Care - HMO

## 2016-08-05 ENCOUNTER — Ambulatory Visit: Payer: Commercial Managed Care - HMO | Admitting: Internal Medicine

## 2016-08-06 ENCOUNTER — Ambulatory Visit (HOSPITAL_BASED_OUTPATIENT_CLINIC_OR_DEPARTMENT_OTHER): Payer: Commercial Managed Care - HMO | Admitting: Internal Medicine

## 2016-08-06 ENCOUNTER — Encounter: Payer: Self-pay | Admitting: *Deleted

## 2016-08-06 ENCOUNTER — Other Ambulatory Visit (HOSPITAL_BASED_OUTPATIENT_CLINIC_OR_DEPARTMENT_OTHER): Payer: Commercial Managed Care - HMO

## 2016-08-06 ENCOUNTER — Encounter: Payer: Self-pay | Admitting: Internal Medicine

## 2016-08-06 VITALS — BP 120/60 | HR 81 | Temp 97.7°F | Resp 17 | Ht 66.0 in | Wt 183.6 lb

## 2016-08-06 DIAGNOSIS — Z79899 Other long term (current) drug therapy: Secondary | ICD-10-CM | POA: Diagnosis not present

## 2016-08-06 DIAGNOSIS — C3491 Malignant neoplasm of unspecified part of right bronchus or lung: Secondary | ICD-10-CM

## 2016-08-06 DIAGNOSIS — C7801 Secondary malignant neoplasm of right lung: Secondary | ICD-10-CM

## 2016-08-06 DIAGNOSIS — Z5112 Encounter for antineoplastic immunotherapy: Secondary | ICD-10-CM

## 2016-08-06 DIAGNOSIS — Z5111 Encounter for antineoplastic chemotherapy: Secondary | ICD-10-CM

## 2016-08-06 DIAGNOSIS — J9611 Chronic respiratory failure with hypoxia: Secondary | ICD-10-CM

## 2016-08-06 LAB — CBC WITH DIFFERENTIAL/PLATELET
BASO%: 0.9 % (ref 0.0–2.0)
Basophils Absolute: 0.1 10*3/uL (ref 0.0–0.1)
EOS ABS: 0.2 10*3/uL (ref 0.0–0.5)
EOS%: 1.7 % (ref 0.0–7.0)
HEMATOCRIT: 39.1 % (ref 38.4–49.9)
HGB: 12.4 g/dL — ABNORMAL LOW (ref 13.0–17.1)
LYMPH%: 5.7 % — ABNORMAL LOW (ref 14.0–49.0)
MCH: 27.5 pg (ref 27.2–33.4)
MCHC: 31.9 g/dL — AB (ref 32.0–36.0)
MCV: 86.4 fL (ref 79.3–98.0)
MONO#: 0.5 10*3/uL (ref 0.1–0.9)
MONO%: 5.5 % (ref 0.0–14.0)
NEUT%: 86.2 % — AB (ref 39.0–75.0)
NEUTROS ABS: 8.5 10*3/uL — AB (ref 1.5–6.5)
PLATELETS: 128 10*3/uL — AB (ref 140–400)
RBC: 4.52 10*6/uL (ref 4.20–5.82)
RDW: 16.4 % — AB (ref 11.0–14.6)
WBC: 9.9 10*3/uL (ref 4.0–10.3)
lymph#: 0.6 10*3/uL — ABNORMAL LOW (ref 0.9–3.3)

## 2016-08-06 LAB — COMPREHENSIVE METABOLIC PANEL
ALK PHOS: 108 U/L (ref 40–150)
ALT: 14 U/L (ref 0–55)
ANION GAP: 10 meq/L (ref 3–11)
AST: 16 U/L (ref 5–34)
Albumin: 3.2 g/dL — ABNORMAL LOW (ref 3.5–5.0)
BILIRUBIN TOTAL: 0.38 mg/dL (ref 0.20–1.20)
BUN: 13 mg/dL (ref 7.0–26.0)
CALCIUM: 9.3 mg/dL (ref 8.4–10.4)
CO2: 26 mEq/L (ref 22–29)
CREATININE: 0.9 mg/dL (ref 0.7–1.3)
Chloride: 101 mEq/L (ref 98–109)
EGFR: >90 mL/min/{1.73_m2} (ref >90)
Glucose: 119 mg/dl (ref 70–140)
Potassium: 4.1 mEq/L (ref 3.5–5.1)
Sodium: 138 mEq/L (ref 136–145)
TOTAL PROTEIN: 6.5 g/dL (ref 6.4–8.3)

## 2016-08-06 LAB — TSH: TSH: 2.668 m[IU]/L (ref 0.320–4.118)

## 2016-08-06 NOTE — Progress Notes (Signed)
Ernest [name] [phone]   [phone]  OFFICE PROGRESS NOTE  [name], MD [address] [facility] [address] [place] [address]  DIAGNOSIS: Metastatic non-small cell lung cancer, poorly differentiated carcinoma initially diagnosed as Stage IIIA (T3, N2, M0) non-small cell lung cancer consistent with squamous cell carcinoma involving the left suprahilar mass with mediastinal lymphadenopathy diagnosed in November of 2014. The patient has recurrence in February 2017. PDL 1 expression 0%.  PRIOR THERAPY:  1) Concurrent chemoradiation with weekly carboplatin for AUC of 2 and paclitaxel 45 mg/M2, status post 7 cycles, last dose was given 12/20/2013. First dose on 11/01/2013. 2) Consolidation chemotherapy with carboplatin for AUC of 5 and paclitaxel 175 mg/M2 every 3 weeks with Neulasta support. First cycle on 02/08/2014. Status post 3 cycles and carboplatin was discontinued secondary to allergic reaction. 3) Abraxane 100 MG/M2 on days 1 and 8 every 3 weeks. Status post 3 cycles. Last dose was given 04/16/2016 discontinued secondary to disease progression. 4) Nivolumab 240 MG IV every 2 weeks. First dose 05/27/2016. Status post one cycle. Discontinued secondary to disease progression.   CURRENT THERAPY: Evaluation for enrollment in the SWOG S1400 clinical trial.  CHEMOTHERAPY INTENT: Palliative  CURRENT # OF CHEMOTHERAPY CYCLES: 0  CURRENT ANTIEMETICS: Zofran, dexamethasone and Compazine  CURRENT SMOKING STATUS: Former smoker  ORAL CHEMOTHERAPY AND CONSENT: None  CURRENT BISPHOSPHONATES USE: None  PAIN MANAGEMENT: 0/10  NARCOTICS INDUCED CONSTIPATION: None.  LIVING WILL AND CODE STATUS: Full code.   INTERVAL HISTORY: Ernest [name] 60 y.o. male returns to the clinic today for follow-up visit accompanied by his wife. He is feeling fine today except for the persistent shortness of breath. He denied having any significant chest pain but  continues to have cough with no hemoptysis. He has no significant weight loss or night sweats. He has no nausea, vomiting, diarrhea or constipation. He has no fever or chills. He was seen at [facility] by Dr. Durenda Hurt but the patient was not a candidate for the MET inhibitor clinical trial. He had a recent CT scan of the chest, abdomen and pelvis for restaging of his disease and he is here for discussion of his scan results and treatment options.   MEDICAL HISTORY: Past Medical History:  Diagnosis Date  . Anxiety   . CAD (coronary artery disease)    Left Main 30% stenosis, LAD 20 - 30 % stenosis, first and second diagonal branchesat 40 - 50%  stenosis with small arteries, circumflex had 30% stenosis in the large obtuse marginal, RCA at 70 - 80%  stenosis [not felt to be occlusive after evaluation with flow wire], distal 50 - 60% stenosis - [name][  . Cancer ([facility])    lung  . Chronic insomnia   . COPD (chronic obstructive pulmonary disease) (HCC)    Dr. Baird Lyons  . COPD with asthma ([facility]) 09/08/2007  . DDD (degenerative disc disease)   . Depression   . Diabetes mellitus without complication ([facility]) 0/93/2671   no medications now 07/24/2016  . GERD (gastroesophageal reflux disease)   . Gout   . History of cardiovascular stress test    Myoview 7/16:  Diaphragmatic attenuation, no ischemia, EF 56%; Low Risk  . History of radiation therapy 11/10/13- 12/29/13   left lung 6600 cGy in 33 sessions  . Hx of colonoscopy   . Hyperlipidemia   . Hypertension    dr Percival Spanish  . Itching due to drug 03/19/2016  .  Lung cancer ([facility]) 10/04/13   LUL squamous cell lung cancer  . Nausea with vomiting 03/26/2016  . OSA (obstructive sleep apnea)    NPSG 09/10/10- AHI 11.3/hr  . PVD (peripheral vascular disease) (HCC)    PTA/Stent right common iliac    ALLERGIES:  is allergic to carboplatin.  MEDICATIONS:  Current Outpatient Prescriptions  Medication Sig Dispense  Refill  . ADVAIR DISKUS 500-50 MCG/DOSE AEPB 1 PUFF THEN RINSE MOUTH, TWICE DAILY MAINTENANCE 60 each 2  . albuterol (PROVENTIL) (2.5 MG/3ML) 0.083% nebulizer solution INHALE 1 VIAL IN NEBULIZER EVERY 4 HOURS AS NEEDED FOR WHEEZING OR SHORTNESS OF BREATH 300 mL 2  . ARIPiprazole (ABILIFY) 2 MG tablet Take 2 mg by mouth at bedtime.   4  . Armodafinil 250 MG tablet Take 250 mg by mouth every morning.  5  . aspirin 81 MG EC tablet TAKE 1 TABLET BY MOUTH EVERY DAY 30 tablet 5  . atorvastatin (LIPITOR) 20 MG tablet TAKE 1 TABLET EVERY DAY (Patient taking differently: TAKE 20 MG  EVERY DAY IN THE EVENING) 90 tablet 3  . BELSOMRA 20 MG TABS Take 20 mg by mouth at bedtime as needed (for sleep). Reported on 03/29/2016  4  . clonazePAM (KLONOPIN) 1 MG tablet Take 2 mg by mouth at bedtime as needed for anxiety. Reported on 01/16/2016    . clopidogrel (PLAVIX) 75 MG tablet Take 1 tablet (75 mg total) by mouth daily. 90 tablet 3  . dextromethorphan-guaiFENesin (MUCINEX DM) 30-600 MG 12hr tablet Take 1 tablet by mouth 2 (two) times daily as needed for cough (congestion).    [name] esomeprazole (NEXIUM) 40 MG capsule TAKE ONE CAPSULE BY MOUTH EVERY DAY (Patient taking differently: TAKE 40 MG  BY MOUTH EVERY DAY) 90 capsule 3  . feeding supplement, ENSURE ENLIVE, (ENSURE ENLIVE) LIQD Take 237 mLs by mouth 2 (two) times daily between meals. 237 mL 12  . FLORA-Q (FLORA-Q) CAPS capsule Take 1 capsule by mouth daily. 20 capsule 0  . FLUoxetine (PROZAC) 40 MG capsule Take 40 mg by mouth every morning.    . gabapentin (NEURONTIN) 100 MG capsule Take 100 mg by mouth daily.     [name] glipiZIDE (GLUCOTROL) 5 MG tablet Take 0.5 tablets (2.5 mg total) by mouth daily before breakfast. 30 tablet 5  . ibuprofen (ADVIL,MOTRIN) 600 MG tablet Take 1 tablet (600 mg total) by mouth every 8 (eight) hours as needed. 90 tablet 3  . isosorbide mononitrate (IMDUR) 120 MG 24 hr tablet     . KLOR-CON M20 20 MEQ tablet TAKE 1 TABLET BY MOUTH EVERY  MORNING 90 tablet 1  . Methylnaltrexone Bromide (RELISTOR) 150 MG TABS Take 3 tablets by mouth every morning. 90 tablet 11  . metoprolol succinate (TOPROL-XL) 25 MG 24 hr tablet     . mirtazapine (REMERON) 45 MG tablet Take 1 tablet (45 mg total) by mouth at bedtime.    [name] morphine (MS CONTIN) 30 MG 12 hr tablet Take 1 tablet (30 mg total) by mouth every 12 (twelve) hours. 60 tablet 0  . morphine (MSIR) 15 MG tablet Take 1 tablet (15 mg total) by mouth every 4 (four) hours as needed for severe pain. 30 tablet 0  . nitroGLYCERIN (NITROSTAT) 0.4 MG SL tablet Place 1 tablet (0.4 mg total) under the tongue every 5 (five) minutes as needed for chest pain. 25 tablet 3  . ondansetron (ZOFRAN) 8 MG tablet TAKE 1 TABLET (8 MG TOTAL) BY MOUTH EVERY 8 (EIGHT) HOURS  AS NEEDED FOR NAUSEA OR VOMITING. 20 tablet 0  . OXYGEN Inhale 2 L into the lungs daily.     . pantoprazole (PROTONIX) 40 MG tablet TAKE 1 TABLET (40 MG TOTAL) BY MOUTH DAILY.  11  . predniSONE (DELTASONE) 5 MG tablet     . prochlorperazine (COMPAZINE) 10 MG tablet TAKE 1 TABLET EVERY 6 HOURS AS NEEDED FOR NAUSEA AND VOMITING 30 tablet 1  . scopolamine (TRANSDERM-SCOP) 1 MG/3DAYS Place 1 patch (1.5 mg total) onto the skin every 3 (three) days. 4 patch 0  . theophylline (UNIPHYL) 400 MG 24 hr tablet TAKE 1 TABLET EVERY DAY (Patient taking differently: TAKE 400 MG BY MOUTH DAILY) 30 tablet 5  . Tiotropium Bromide Monohydrate (SPIRIVA RESPIMAT) 1.25 MCG/ACT AERS Inhale 2 puffs into the lungs daily. 4 g 11  . traZODone (DESYREL) 150 MG tablet Take 1 tablet (150 mg total) by mouth at bedtime. 90 tablet 3  . VENTOLIN HFA 108 (90 Base) MCG/ACT inhaler INHALE 2 PUFFS INTO THE LUNGS 4 TIMES DAILY AS NEEDED FOR WHEEZING 18 Inhaler 2   No current facility-administered medications for this visit.     SURGICAL HISTORY:  Past Surgical History:  Procedure Laterality Date  . ANGIOPLASTY    . ANTERIOR FUSION CERVICAL SPINE     cervical fusion  7 yrs ago  ([facility])  . arm surgery     left elbow  . BACK SURGERY     lower  . c-spine surgery    . COLON SURGERY     '11 "Diverticulitis"  . COLONOSCOPY WITH PROPOFOL N/A 05/22/2015   Procedure: COLONOSCOPY WITH PROPOFOL;  Surgeon: Inda Castle, MD;  Location: [facility];  Service: Endoscopy;  Laterality: N/A;  . HIP SURGERY     left 'bone graft taken"  . PERIPHERAL VASCULAR CATHETERIZATION N/A 08/10/2015   Procedure: Lower Extremity Angiography;  Surgeon: Lorretta Harp, MD; Distal Ao OK, L-EIA stent OK, R-CIA 100% s/p overlapping 7 mm x 38 mm ICast stents, 50-60% R-CFA      . SHOULDER SURGERY     right  . SPINAL FUSION  03/05/2007   L4-L5  . VIDEO BRONCHOSCOPY WITH ENDOBRONCHIAL NAVIGATION N/A 10/04/2013   Procedure: VIDEO BRONCHOSCOPY WITH ENDOBRONCHIAL NAVIGATION;  Surgeon: Collene Gobble, MD;  Location: [facility];  Service: Thoracic;  Laterality: N/A;    REVIEW OF SYSTEMS:  Constitutional: positive for fatigue Eyes: negative Ears, nose, mouth, throat, and face: negative Respiratory: positive for cough, dyspnea on exertion and emphysema Cardiovascular: negative Gastrointestinal: negative Genitourinary:negative Integument/breast: negative Hematologic/lymphatic: negative Musculoskeletal:negative Neurological: negative Behavioral/Psych: negative Endocrine: negative Allergic/Immunologic: negative   PHYSICAL EXAMINATION: General appearance: alert, cooperative and no distress Head: Normocephalic, without obvious abnormality, atraumatic Neck: no adenopathy, no JVD, supple, symmetrical, trachea midline and thyroid not enlarged, symmetric, no tenderness/mass/nodules Lymph nodes: Cervical, supraclavicular, and axillary nodes normal. Resp: wheezes bilaterally Back: symmetric, no curvature. ROM normal. No CVA tenderness. Cardio: regular rate and rhythm, S1, S2 normal, no murmur, click, rub or gallop GI: soft, non-tender; bowel sounds normal; no masses,  no organomegaly Extremities:  extremities normal, atraumatic, no cyanosis or edema Neurologic: Alert and oriented X 3, normal strength and tone. Normal symmetric reflexes. Normal coordination and gait  ECOG PERFORMANCE STATUS: 1 - Symptomatic but completely ambulatory  Blood pressure 120/60, pulse 81, temperature 97.7 F (36.5 C), temperature source Oral, resp. rate 17, height 5' 6"  (1.676 m), weight 183 lb 9.6 oz (83.3 kg), SpO2 93 %.  LABORATORY DATA: Lab Results  Component  Value Date   WBC 9.9 08/06/2016   HGB 12.4 (L) 08/06/2016   HCT 39.1 08/06/2016   MCV 86.4 08/06/2016   PLT 128 (L) 08/06/2016      Chemistry      Component Value Date/Time   NA 141 07/23/2016 0930   K 4.1 07/23/2016 0930   CL 94 (L) 06/07/2016 0915   CO2 27 07/23/2016 0930   BUN 9.3 07/23/2016 0930   CREATININE 0.9 07/23/2016 0930      Component Value Date/Time   CALCIUM 10.2 07/23/2016 0930   ALKPHOS 115 07/23/2016 0930   AST 15 07/23/2016 0930   ALT 11 07/23/2016 0930   BILITOT 0.37 07/23/2016 0930       RADIOGRAPHIC STUDIES: Dg Chest 2 View  Result Date: 07/18/2016 CLINICAL DATA:  Right-sided chest pain and cough and congestion for the past 2 days. History of lung malignancy bilaterally, COPD, former smoker. EXAM: CHEST  2 VIEW COMPARISON:  CT scan of the chest and PA and lateral chest x-ray of June 07, 2016. FINDINGS: The lungs are hyperinflated. There is stable volume loss in the left upper lobe. The large right lower lobe mass is again demonstrated and now measures 6.7 cm AP x 7.7 cm x 7.8 cm superior to inferior. There is no alveolar infiltrate. There are tiny bilateral pleural effusions blunting the posterior costophrenic angles. The heart is top-normal in size but stable. There is right paratracheal soft tissue prominence which is more conspicuous than seen previously. The bony thorax exhibits no acute abnormality. IMPRESSION: Interval increase in size of the large right lower lobe mass. Fairly stable density in the left  pulmonary apex. Increased prominence of right paratracheal lymphadenopathy. Trace pleural effusions posteriorly. COPD.  No CHF, pneumonia, nor pneumothorax. Electronically Signed   By: David  Martinique M.D.   On: 07/18/2016 15:07   Ct Chest W Contrast  Result Date: 07/24/2016 CLINICAL DATA:  Followup lung cancer EXAM: CT CHEST, ABDOMEN, AND PELVIS WITH CONTRAST TECHNIQUE: Multidetector CT imaging of the chest, abdomen and pelvis was performed following the standard protocol during bolus administration of intravenous contrast. CONTRAST:  131m ISOVUE-300 IOPAMIDOL (ISOVUE-300) INJECTION 61% COMPARISON:  06/07/2016 FINDINGS: CT CHEST FINDINGS Mediastinum/Lymph Nodes: The heart size appears normal. No pericardial effusion identified. There is aortic atherosclerosis noted. Calcification within the thoracic aorta as well as the RCA, LAD and left circumflex coronary artery noted. The trachea appears patent and is midline. Normal appearance of the esophagus. The index right paratracheal lymph node measures 3.9 cm, image 22 of series 2. Previously 2.0 cm. The index sub- carinal lymph node measures 3.1 cm, image 28 of series 2. Previously 1.9 cm. New right paratracheal lymph node measures 1.5 cm, image 16 of series 2. Lungs/Pleura: Small right pleural effusion unchanged. Moderate changes of centrilobular emphysema. Index right lower lobe lung mass measures 5.5 x 6.5 cm, image 39 of series 2. Previously 5.2 x 6.9 cm. Left upper lobe consolidation, fibrosis and cavitation is again noted and appears unchanged from previous exam. The index nodule within the left lower lobe measures 7 mm, image 102 of series 4. Previously this measured the same. Musculoskeletal: No chest wall mass or suspicious bone lesions identified. CT ABDOMEN PELVIS FINDINGS Hepatobiliary: No suspicious liver abnormality. The gallbladder is normal. No biliary dilatation. Pancreas: No mass, inflammatory changes, or other significant abnormality. Spleen:  Low-attenuation lesion within the inferior spleen measures 5.1 cm, image 66 of series 2. Unchanged from previous exam. Adrenals/Urinary Tract: The adrenal glands are normal. The  kidneys scratch set normal appearance of the left kidney. There is a cyst within the lower pole the right kidney which measures 1.6 cm, image 74 of series 2. Unchanged from previous exam. The urinary bladder is normal. Stomach/Bowel: The stomach is within normal limits. The small bowel loops have a normal course and caliber. No obstruction. Normal appearance of the colon. The appendix is visualized and appears normal. Numerous distal colonic diverticula identified without acute inflammation. Vascular/Lymphatic: Aortic atherosclerosis noted. No upper abdominal adenopathy. No pelvic or inguinal adenopathy. Reproductive: The prostate gland and seminal vesicles are normal. Other: There is no ascites or focal fluid collections within the abdomen or pelvis. Musculoskeletal: No aggressive lytic or sclerotic bone lesion. Previous posterior fixation of the lumbar spine. IMPRESSION: 1. Interval progression of mediastinal adenopathy. 2. Index mass within the right lower lobe is stable compared with previous exam. 3. Low-attenuation lesion within the spleen is unchanged from previous exam. Electronically Signed   By: Kerby Moors M.D.   On: 07/24/2016 08:48   Mr Jeri Cos ST Contrast  Result Date: 07/23/2016 CLINICAL DATA:  Non-small cell lung cancer. Headaches and visual changes. Restaging. EXAM: MRI HEAD WITHOUT AND WITH CONTRAST TECHNIQUE: Multiplanar, multiecho pulse sequences of the brain and surrounding structures were obtained without and with intravenous contrast. CONTRAST:  64m MULTIHANCE GADOBENATE DIMEGLUMINE 529 MG/ML IV SOLN COMPARISON:  10/19/2013 FINDINGS: There is a new punctate 3 mm focus of elevated diffusion-weighted signal involving the left precentral gyrus without clearly reduced ADC. There is no associated enhancement or  definite T2 signal abnormality in this location. No diffusion abnormality is seen elsewhere. There is no evidence of intracranial hemorrhage, mass, midline shift, or extra-axial fluid collection. Minimal cerebral white matter T2 signal changes are within normal limits for age. No abnormal brain parenchymal or meningeal enhancement is identified. There is mild cerebral atrophy. Orbits are unremarkable. There is minimal left sphenoid sinus mucosal thickening. The mastoid air cells are clear. Major intracranial vascular flow voids are unchanged, with a markedly hypoplastic right vertebral artery again noted. Subcentimeter T2 hyperintense/ cystic focus just posterior to the left parotid gland is unchanged and benign in appearance. Mild diffuse heterogeneity of the bone marrow signal of the skull is unchanged. No destructive osseous lesion is identified. IMPRESSION: 1. Punctate focus of diffusion abnormality in the left precentral gyrus which may reflect a tiny subacute embolic infarct. 2. No enhancing lesions to indicate metastatic disease. Electronically Signed   By: ALogan BoresM.D.   On: 07/23/2016 15:27   Ct Abdomen Pelvis W Contrast  Result Date: 07/24/2016 CLINICAL DATA:  Followup lung cancer EXAM: CT CHEST, ABDOMEN, AND PELVIS WITH CONTRAST TECHNIQUE: Multidetector CT imaging of the chest, abdomen and pelvis was performed following the standard protocol during bolus administration of intravenous contrast. CONTRAST:  1074mISOVUE-300 IOPAMIDOL (ISOVUE-300) INJECTION 61% COMPARISON:  06/07/2016 FINDINGS: CT CHEST FINDINGS Mediastinum/Lymph Nodes: The heart size appears normal. No pericardial effusion identified. There is aortic atherosclerosis noted. Calcification within the thoracic aorta as well as the RCA, LAD and left circumflex coronary artery noted. The trachea appears patent and is midline. Normal appearance of the esophagus. The index right paratracheal lymph node measures 3.9 cm, image 22 of series 2.  Previously 2.0 cm. The index sub- carinal lymph node measures 3.1 cm, image 28 of series 2. Previously 1.9 cm. New right paratracheal lymph node measures 1.5 cm, image 16 of series 2. Lungs/Pleura: Small right pleural effusion unchanged. Moderate changes of centrilobular emphysema. Index right lower lobe  lung mass measures 5.5 x 6.5 cm, image 39 of series 2. Previously 5.2 x 6.9 cm. Left upper lobe consolidation, fibrosis and cavitation is again noted and appears unchanged from previous exam. The index nodule within the left lower lobe measures 7 mm, image 102 of series 4. Previously this measured the same. Musculoskeletal: No chest wall mass or suspicious bone lesions identified. CT ABDOMEN PELVIS FINDINGS Hepatobiliary: No suspicious liver abnormality. The gallbladder is normal. No biliary dilatation. Pancreas: No mass, inflammatory changes, or other significant abnormality. Spleen: Low-attenuation lesion within the inferior spleen measures 5.1 cm, image 66 of series 2. Unchanged from previous exam. Adrenals/Urinary Tract: The adrenal glands are normal. The kidneys scratch set normal appearance of the left kidney. There is a cyst within the lower pole the right kidney which measures 1.6 cm, image 74 of series 2. Unchanged from previous exam. The urinary bladder is normal. Stomach/Bowel: The stomach is within normal limits. The small bowel loops have a normal course and caliber. No obstruction. Normal appearance of the colon. The appendix is visualized and appears normal. Numerous distal colonic diverticula identified without acute inflammation. Vascular/Lymphatic: Aortic atherosclerosis noted. No upper abdominal adenopathy. No pelvic or inguinal adenopathy. Reproductive: The prostate gland and seminal vesicles are normal. Other: There is no ascites or focal fluid collections within the abdomen or pelvis. Musculoskeletal: No aggressive lytic or sclerotic bone lesion. Previous posterior fixation of the lumbar spine.  IMPRESSION: 1. Interval progression of mediastinal adenopathy. 2. Index mass within the right lower lobe is stable compared with previous exam. 3. Low-attenuation lesion within the spleen is unchanged from previous exam. Electronically Signed   By: Kerby Moors M.D.   On: 07/24/2016 08:48   Dg Abd 2 Views  Result Date: 07/18/2016 CLINICAL DATA:  Five days of constipation. History of nausea and bloating. EXAM: ABDOMEN - 2 VIEW COMPARISON:  Supine abdominal radiograph of Apr 11, 2015 FINDINGS: The colonic stool burden is mildly increased. There is no small or large bowel obstructive pattern. There 2 small air-fluid levels in the mid abdomen. No free extraluminal gas collections are observed. The patient has undergone previous posterior fusion at L4-5. There iliac stent grafts in place. IMPRESSION: Moderately increased colonic stool burden may reflect constipation in the appropriate clinical setting. There is no evidence of small or large bowel obstruction or perforation. Electronically Signed   By: David  Martinique M.D.   On: 07/18/2016 15:09    ASSESSMENT AND PLAN: This is a very pleasant 60 years old white male with stage IIIA non-small cell lung cancer, squamous cell carcinoma currently undergoing concurrent chemoradiation with weekly carboplatin and paclitaxel status post 7 weeks of treatment. He tolerated his treatment fairly well with no significant adverse effects. This was followed by consolidation chemotherapy with carboplatin and paclitaxel status post 3 cycles, .carboplatin was discontinued after cycle #2 secondary to hypersensitivity reaction. The recent CT scan of the chest progressive enlargement of the right lower lobe lung nodule concerning for pulmonary metastasis or new synchronous tumor. The recent PET scan showed hypermetabolic activity in the 1.9 cm right lower lobe pulmonary nodule suspicious for metastasis. There was also small mediastinal node metastasis. CT-guided core biopsy of the  right lower lobe pulmonary nodule showed poorly differentiated carcinoma. PDL 1 expression was 0% The patient was started on treatment with single agent Abraxane status post 3 cycles but unfortunately restaging CT scan of the chest showed evidence for disease progression. He was then started on treatment with immunotherapy with Nivolumab status post  1 cycle.[name]  Unfortunately the recent CT scan of the chest showed significant disease progression but this is not indicative of his response to the immunotherapy as he received only one cycle. Another CT scan of the chest, abdomen and pelvis performed on 07/24/2016 showed further disease progression. The patient is no take candidate for for the clinical trial with a MET inhibitor at [facility]. I discussed the scan results with the patient and his wife today. I gave him the option of palliative care versus palliative systemic therapy. The patient is interested in proceeding with further treatment. I will check his eligibility for enrollment in the SWOG S 1400. He will cc research nurse later today for discussion of his eligibility. If the patient is not eligible for the clinical trial out consider him for systemic chemotherapy in the future with docetaxel and Cyramza or single agent gemcitabine or Navelbine. I will see him for follow-up visit after evaluation for the clinical trial. He was advised to call immediately if he has any concerning symptoms in the interval. The patient voices understanding of current disease status and treatment options and is in agreement with the current care plan.  All questions were answered. The patient knows to call the clinic with any problems, questions or concerns. We can certainly see the patient much sooner if necessary.  Disclaimer: This note was dictated with voice recognition software. Similar sounding words can inadvertently be transcribed and may not be corrected upon review.

## 2016-08-09 ENCOUNTER — Encounter: Payer: Self-pay | Admitting: Nurse Practitioner

## 2016-08-09 ENCOUNTER — Other Ambulatory Visit: Payer: Self-pay | Admitting: *Deleted

## 2016-08-09 ENCOUNTER — Ambulatory Visit (INDEPENDENT_AMBULATORY_CARE_PROVIDER_SITE_OTHER): Payer: Commercial Managed Care - HMO | Admitting: Nurse Practitioner

## 2016-08-09 VITALS — BP 122/64 | HR 74 | Temp 97.8°F | Ht 66.0 in | Wt 184.0 lb

## 2016-08-09 DIAGNOSIS — G43019 Migraine without aura, intractable, without status migrainosus: Secondary | ICD-10-CM | POA: Diagnosis not present

## 2016-08-09 DIAGNOSIS — C3491 Malignant neoplasm of unspecified part of right bronchus or lung: Secondary | ICD-10-CM

## 2016-08-09 MED ORDER — BUTALBITAL-APAP-CAFFEINE 50-325-40 MG PO TABS: 1.0000 | ORAL_TABLET | Freq: Four times a day (QID) | ORAL | 0 refills | Status: AC | PRN

## 2016-08-09 NOTE — Progress Notes (Signed)
Pre visit review using our clinic review tool, if applicable. No additional management support is needed unless otherwise documented below in the visit note. 

## 2016-08-09 NOTE — Patient Instructions (Signed)
Migraine Headache  A migraine headache is very bad, throbbing pain on one or both sides of your head. Talk to your doctor about what things may bring on (trigger) your migraine headaches.  HOME CARE  · Only take medicines as told by your doctor.  · Lie down in a dark, quiet room when you have a migraine.  · Keep a journal to find out if certain things bring on migraine headaches. For example, write down:    What you eat and drink.    How much sleep you get.    Any change to your diet or medicines.  · Lessen how much alcohol you drink.  · Quit smoking if you smoke.  · Get enough sleep.  · Lessen any stress in your life.  · Keep lights dim if bright lights bother you or make your migraines worse.  GET HELP RIGHT AWAY IF:   · Your migraine becomes really bad.  · You have a fever.  · You have a stiff neck.  · You have trouble seeing.  · Your muscles are weak, or you lose muscle control.  · You lose your balance or have trouble walking.  · You feel like you will pass out (faint), or you pass out.  · You have really bad symptoms that are different than your first symptoms.  MAKE SURE YOU:   · Understand these instructions.  · Will watch your condition.  · Will get help right away if you are not doing well or get worse.     This information is not intended to replace advice given to you by your health care provider. Make sure you discuss any questions you have with your health care provider.     Document Released: 08/20/2008 Document Revised: 02/03/2012 Document Reviewed: 07/19/2013  Elsevier Interactive Patient Education ©2016 [employer]

## 2016-08-09 NOTE — Progress Notes (Signed)
Subjective:  Patient ID: Ernest [name], male    DOB: Mar 04, 1956  Age: 60 y.o. MRN: [id number]  CC: Follow-up (requesting different headache medication/ states current medication ibufropen  is not working/) and Headache (pt stated still having headache and pain right side of the face)   Headache   This is a recurrent problem. The current episode started 1 to 4 weeks ago. The problem occurs intermittently. The problem has been waxing and waning. The pain is located in the right unilateral region. The pain does not radiate. The pain quality is similar to prior headaches. The quality of the pain is described as aching, band-like and dull. The pain is at a severity of 6/10. The pain is moderate. Associated symptoms include sinus pressure. Pertinent negatives include no abnormal behavior, anorexia, blurred vision, coughing, dizziness, ear pain, eye pain, eye watering, facial sweating, fever, hearing loss, muscle aches, nausea, neck pain, photophobia, rhinorrhea, tingling, visual change, vomiting or weakness. Nothing aggravates the symptoms. He has tried NSAIDs for the symptoms. The treatment provided mild relief. His past medical history is significant for cancer and migraine headaches. There is no history of recent head traumas, sinus disease or TMJ.  Head MRI done 2weeks ago: no acute findings. States he is allergic to imitrex but unable to remember type of allergic reaction.  Outpatient Medications Prior to Visit  Medication Sig Dispense Refill  . ADVAIR DISKUS 500-50 MCG/DOSE AEPB 1 PUFF THEN RINSE MOUTH, TWICE DAILY MAINTENANCE 60 each 2  . albuterol (PROVENTIL) (2.5 MG/3ML) 0.083% nebulizer solution INHALE 1 VIAL IN NEBULIZER EVERY 4 HOURS AS NEEDED FOR WHEEZING OR SHORTNESS OF BREATH 300 mL 2  . ARIPiprazole (ABILIFY) 2 MG tablet Take 2 mg by mouth at bedtime.   4  . Armodafinil 250 MG tablet Take 250 mg by mouth every morning.  5  . aspirin 81 MG EC tablet TAKE 1 TABLET BY MOUTH EVERY DAY  30 tablet 5  . atorvastatin (LIPITOR) 20 MG tablet TAKE 1 TABLET EVERY DAY (Patient taking differently: TAKE 20 MG  EVERY DAY IN THE EVENING) 90 tablet 3  . BELSOMRA 20 MG TABS Take 20 mg by mouth at bedtime as needed (for sleep). Reported on 03/29/2016  4  . clonazePAM (KLONOPIN) 1 MG tablet Take 2 mg by mouth at bedtime as needed for anxiety. Reported on 01/16/2016    . clopidogrel (PLAVIX) 75 MG tablet Take 1 tablet (75 mg total) by mouth daily. 90 tablet 3  . dextromethorphan-guaiFENesin (MUCINEX DM) 30-600 MG 12hr tablet Take 1 tablet by mouth 2 (two) times daily as needed for cough (congestion).    [name] esomeprazole (NEXIUM) 40 MG capsule TAKE ONE CAPSULE BY MOUTH EVERY DAY (Patient taking differently: TAKE 40 MG  BY MOUTH EVERY DAY) 90 capsule 3  . feeding supplement, ENSURE ENLIVE, (ENSURE ENLIVE) LIQD Take 237 mLs by mouth 2 (two) times daily between meals. 237 mL 12  . FLORA-Q (FLORA-Q) CAPS capsule Take 1 capsule by mouth daily. 20 capsule 0  . FLUoxetine (PROZAC) 40 MG capsule Take 40 mg by mouth every morning.    . gabapentin (NEURONTIN) 100 MG capsule Take 100 mg by mouth daily.     [name] glipiZIDE (GLUCOTROL) 5 MG tablet Take 0.5 tablets (2.5 mg total) by mouth daily before breakfast. 30 tablet 5  . ibuprofen (ADVIL,MOTRIN) 600 MG tablet Take 1 tablet (600 mg total) by mouth every 8 (eight) hours as needed. 90 tablet 3  . isosorbide mononitrate (IMDUR) 120 MG  24 hr tablet     . KLOR-CON M20 20 MEQ tablet TAKE 1 TABLET BY MOUTH EVERY MORNING 90 tablet 1  . Methylnaltrexone Bromide (RELISTOR) 150 MG TABS Take 3 tablets by mouth every morning. 90 tablet 11  . metoprolol succinate (TOPROL-XL) 25 MG 24 hr tablet     . mirtazapine (REMERON) 45 MG tablet Take 1 tablet (45 mg total) by mouth at bedtime.    [name] morphine (MS CONTIN) 30 MG 12 hr tablet Take 1 tablet (30 mg total) by mouth every 12 (twelve) hours. 60 tablet 0  . morphine (MSIR) 15 MG tablet Take 1 tablet (15 mg total) by mouth every 4  (four) hours as needed for severe pain. 30 tablet 0  . nitroGLYCERIN (NITROSTAT) 0.4 MG SL tablet Place 1 tablet (0.4 mg total) under the tongue every 5 (five) minutes as needed for chest pain. 25 tablet 3  . ondansetron (ZOFRAN) 8 MG tablet TAKE 1 TABLET (8 MG TOTAL) BY MOUTH EVERY 8 (EIGHT) HOURS AS NEEDED FOR NAUSEA OR VOMITING. 20 tablet 0  . OXYGEN Inhale 2 L into the lungs daily.     . pantoprazole (PROTONIX) 40 MG tablet TAKE 1 TABLET (40 MG TOTAL) BY MOUTH DAILY.  11  . predniSONE (DELTASONE) 5 MG tablet     . prochlorperazine (COMPAZINE) 10 MG tablet TAKE 1 TABLET EVERY 6 HOURS AS NEEDED FOR NAUSEA AND VOMITING 30 tablet 1  . scopolamine (TRANSDERM-SCOP) 1 MG/3DAYS Place 1 patch (1.5 mg total) onto the skin every 3 (three) days. 4 patch 0  . theophylline (UNIPHYL) 400 MG 24 hr tablet TAKE 1 TABLET EVERY DAY (Patient taking differently: TAKE 400 MG BY MOUTH DAILY) 30 tablet 5  . Tiotropium Bromide Monohydrate (SPIRIVA RESPIMAT) 1.25 MCG/ACT AERS Inhale 2 puffs into the lungs daily. 4 g 11  . traZODone (DESYREL) 150 MG tablet Take 1 tablet (150 mg total) by mouth at bedtime. 90 tablet 3  . VENTOLIN HFA 108 (90 Base) MCG/ACT inhaler INHALE 2 PUFFS INTO THE LUNGS 4 TIMES DAILY AS NEEDED FOR WHEEZING 18 Inhaler 2   No facility-administered medications prior to visit.     ROS See HPI  Objective:  BP 122/64 (BP Location: Left Arm, Patient Position: Sitting, Cuff Size: Normal)   Pulse 74   Temp 97.8 F (36.6 C)   Ht '5\' 6"'$  (1.676 m)   Wt 184 lb (83.5 kg)   SpO2 95%   BMI 29.70 kg/m   BP Readings from Last 3 Encounters:  08/09/16 122/64  08/06/16 120/60  08/01/16 128/60    Wt Readings from Last 3 Encounters:  08/09/16 184 lb (83.5 kg)  08/06/16 183 lb 9.6 oz (83.3 kg)  08/01/16 182 lb (82.6 kg)    Physical Exam  Constitutional: He is oriented to person, place, and time. No distress.  HENT:  Right Ear: External ear normal.  Left Ear: External ear normal.  Nose: Nose  normal.  Mouth/Throat: Oropharynx is clear and moist. No oropharyngeal exudate.  Eyes: Conjunctivae and EOM are normal. Pupils are equal, round, and reactive to light. No scleral icterus.  Neck: Normal range of motion. Neck supple. No thyromegaly present.  Cardiovascular: Normal rate, regular rhythm and normal heart sounds.   Pulmonary/Chest: Effort normal and breath sounds normal.  Musculoskeletal: Normal range of motion. He exhibits no edema.  Lymphadenopathy:    He has no cervical adenopathy.  Neurological: He is alert and oriented to person, place, and time.  Skin: Skin is warm  and dry. No rash noted.  Psychiatric: He has a normal mood and affect. His behavior is normal.  Vitals reviewed.   Lab Results  Component Value Date   WBC 9.9 08/06/2016   HGB 12.4 (L) 08/06/2016   HCT 39.1 08/06/2016   PLT 128 (L) 08/06/2016   GLUCOSE 119 08/06/2016   CHOL 164 10/05/2015   TRIG 174.0 (H) 10/05/2015   HDL 48.70 10/05/2015   LDLDIRECT 71.4 09/08/2012   LDLCALC 80 10/05/2015   ALT 14 08/06/2016   AST 16 08/06/2016   NA 138 08/06/2016   K 4.1 08/06/2016   CL 94 (L) 06/07/2016   CREATININE 0.9 08/06/2016   BUN 13.0 08/06/2016   CO2 26 08/06/2016   TSH 2.668 08/06/2016   PSA 0.64 07/18/2011   INR 1.14 04/21/2016   HGBA1C 8.1 (H) 04/22/2016   MICROALBUR 5.7 (H) 10/05/2015     Assessment & Plan:   Jourdan was seen today for follow-up and headache.  Diagnoses and all orders for this visit:  Intractable migraine without aura and without status migrainosus -     butalbital-acetaminophen-caffeine (FIORICET, ESGIC) 50-325-40 MG tablet; Take 1-2 tablets by mouth every 6 (six) hours as needed for headache or migraine.   I am having Ernest [name] start on butalbital-acetaminophen-caffeine. I am also having him maintain his clonazePAM, nitroGLYCERIN, mirtazapine, FLUoxetine, gabapentin, atorvastatin, clopidogrel, BELSOMRA, OXYGEN, dextromethorphan-guaiFENesin, Armodafinil,  theophylline, Tiotropium Bromide Monohydrate, FLORA-Q, ADVAIR DISKUS, esomeprazole, glipiZIDE, feeding supplement (ENSURE ENLIVE), VENTOLIN HFA, ARIPiprazole, ondansetron, albuterol, scopolamine, aspirin, morphine, morphine, Methylnaltrexone Bromide, prochlorperazine, KLOR-CON M20, isosorbide mononitrate, metoprolol succinate, pantoprazole, traZODone, ibuprofen, and predniSONE.  Meds ordered this encounter  Medications  . butalbital-acetaminophen-caffeine (FIORICET, ESGIC) 50-325-40 MG tablet    Sig: Take 1-2 tablets by mouth every 6 (six) hours as needed for headache or migraine.    Dispense:  30 tablet    Refill:  0    Order Specific Question:   Supervising Provider    Answer:   Cassandria Anger [[id number]]    Follow-up: Return if symptoms worsen or fail to improve.  Wilfred Lacy, NP

## 2016-08-12 ENCOUNTER — Telehealth: Payer: Self-pay | Admitting: Internal Medicine

## 2016-08-12 MED ORDER — PREDNISONE 5 MG PO TABS: 5.0000 mg | ORAL_TABLET | Freq: Every day | ORAL | 2 refills | Status: DC

## 2016-08-12 MED ORDER — THEOPHYLLINE ER 400 MG PO TB24: 400.0000 mg | ORAL_TABLET | Freq: Every day | ORAL | 5 refills | Status: DC

## 2016-08-12 NOTE — Telephone Encounter (Signed)
Called and spoke with pt and he stated that he went to the beach and had enough meds for 5 days.  He lost these meds and now is needing these refilled.  Refills have been sent in for the pt.  Nothing further Is needed.

## 2016-08-13 ENCOUNTER — Emergency Department (HOSPITAL_COMMUNITY)
Admission: EM | Admit: 2016-08-13 | Discharge: 2016-08-13 | Disposition: A | Discharge status: Home / Self Care | Payer: Commercial Managed Care - HMO | Attending: Emergency Medicine | Admitting: Emergency Medicine

## 2016-08-13 ENCOUNTER — Other Ambulatory Visit: Payer: Commercial Managed Care - HMO

## 2016-08-13 ENCOUNTER — Encounter: Payer: Self-pay | Admitting: *Deleted

## 2016-08-13 ENCOUNTER — Other Ambulatory Visit: Payer: Self-pay

## 2016-08-13 ENCOUNTER — Encounter (HOSPITAL_COMMUNITY): Payer: Self-pay | Admitting: Emergency Medicine

## 2016-08-13 ENCOUNTER — Emergency Department (HOSPITAL_COMMUNITY): Payer: Commercial Managed Care - HMO

## 2016-08-13 ENCOUNTER — Encounter: Payer: Commercial Managed Care - HMO | Admitting: *Deleted

## 2016-08-13 ENCOUNTER — Ambulatory Visit: Payer: Commercial Managed Care - HMO | Admitting: Internal Medicine

## 2016-08-13 ENCOUNTER — Ambulatory Visit: Payer: Commercial Managed Care - HMO

## 2016-08-13 DIAGNOSIS — Z7984 Long term (current) use of oral hypoglycemic drugs: Secondary | ICD-10-CM | POA: Insufficient documentation

## 2016-08-13 DIAGNOSIS — C3490 Malignant neoplasm of unspecified part of unspecified bronchus or lung: Secondary | ICD-10-CM

## 2016-08-13 DIAGNOSIS — C3431 Malignant neoplasm of lower lobe, right bronchus or lung: Secondary | ICD-10-CM | POA: Diagnosis not present

## 2016-08-13 DIAGNOSIS — J45909 Unspecified asthma, uncomplicated: Secondary | ICD-10-CM | POA: Insufficient documentation

## 2016-08-13 DIAGNOSIS — E119 Type 2 diabetes mellitus without complications: Secondary | ICD-10-CM | POA: Insufficient documentation

## 2016-08-13 DIAGNOSIS — J441 Chronic obstructive pulmonary disease with (acute) exacerbation: Secondary | ICD-10-CM | POA: Diagnosis not present

## 2016-08-13 DIAGNOSIS — Z79899 Other long term (current) drug therapy: Secondary | ICD-10-CM | POA: Insufficient documentation

## 2016-08-13 DIAGNOSIS — R51 Headache: Secondary | ICD-10-CM

## 2016-08-13 DIAGNOSIS — I2510 Atherosclerotic heart disease of native coronary artery without angina pectoris: Secondary | ICD-10-CM | POA: Insufficient documentation

## 2016-08-13 DIAGNOSIS — Z87891 Personal history of nicotine dependence: Secondary | ICD-10-CM | POA: Insufficient documentation

## 2016-08-13 DIAGNOSIS — I10 Essential (primary) hypertension: Secondary | ICD-10-CM | POA: Insufficient documentation

## 2016-08-13 DIAGNOSIS — R05 Cough: Secondary | ICD-10-CM | POA: Diagnosis not present

## 2016-08-13 DIAGNOSIS — Z7982 Long term (current) use of aspirin: Secondary | ICD-10-CM | POA: Insufficient documentation

## 2016-08-13 DIAGNOSIS — R519 Headache, unspecified: Secondary | ICD-10-CM

## 2016-08-13 DIAGNOSIS — C3492 Malignant neoplasm of unspecified part of left bronchus or lung: Secondary | ICD-10-CM | POA: Diagnosis not present

## 2016-08-13 LAB — CBC
HEMATOCRIT: 37.5 % — AB (ref 39.0–52.0)
HEMOGLOBIN: 11.9 g/dL — AB (ref 13.0–17.0)
MCH: 28.2 pg (ref 26.0–34.0)
MCHC: 31.7 g/dL (ref 30.0–36.0)
MCV: 88.9 fL (ref 78.0–100.0)
Platelets: 144 10*3/uL — ABNORMAL LOW (ref 150–400)
RBC: 4.22 MIL/uL (ref 4.22–5.81)
RDW: 15.7 % — AB (ref 11.5–15.5)
WBC: 10.7 10*3/uL — AB (ref 4.0–10.5)

## 2016-08-13 LAB — BASIC METABOLIC PANEL
ANION GAP: 9 (ref 5–15)
BUN: 12 mg/dL (ref 6–20)
CO2: 26 mmol/L (ref 22–32)
Calcium: 9.1 mg/dL (ref 8.9–10.3)
Chloride: 102 mmol/L (ref 101–111)
Creatinine, Ser: 0.89 mg/dL (ref 0.61–1.24)
GFR calc Af Amer: >60 mL/min (ref >60)
Glucose, Bld: 82 mg/dL (ref 65–99)
POTASSIUM: 4.1 mmol/L (ref 3.5–5.1)
SODIUM: 137 mmol/L (ref 135–145)

## 2016-08-13 LAB — I-STAT TROPONIN, ED: TROPONIN I, POC: 0 ng/mL (ref 0.00–0.08)

## 2016-08-13 MED ORDER — IPRATROPIUM-ALBUTEROL 0.5-2.5 (3) MG/3ML IN SOLN
3.0000 mL | Freq: Once | RESPIRATORY_TRACT | Status: AC
  Administered 2016-08-13: 3 mL via RESPIRATORY_TRACT
  Filled 2016-08-13: qty 3

## 2016-08-13 MED ORDER — FENTANYL CITRATE (PF) 100 MCG/2ML IJ SOLN
50.0000 ug | Freq: Once | INTRAMUSCULAR | Status: AC
  Administered 2016-08-13: 50 ug via INTRAVENOUS
  Filled 2016-08-13: qty 2

## 2016-08-13 MED ORDER — METHYLPREDNISOLONE SODIUM SUCC 125 MG IJ SOLR
125.0000 mg | Freq: Once | INTRAMUSCULAR | Status: AC
  Administered 2016-08-13: 125 mg via INTRAVENOUS
  Filled 2016-08-13: qty 2

## 2016-08-13 MED ORDER — ALBUTEROL SULFATE (2.5 MG/3ML) 0.083% IN NEBU: 5.0000 mg | INHALATION_SOLUTION | Freq: Once | RESPIRATORY_TRACT | Status: DC

## 2016-08-13 MED ORDER — SODIUM CHLORIDE 0.9 % IV BOLUS (SEPSIS)
1000.0000 mL | Freq: Once | INTRAVENOUS | Status: AC
  Administered 2016-08-13: 1000 mL via INTRAVENOUS

## 2016-08-13 MED ORDER — PREDNISONE 10 MG (21) PO TBPK: 10.0000 mg | ORAL_TABLET | Freq: Every day | ORAL | 0 refills | Status: DC

## 2016-08-13 MED ORDER — MORPHINE SULFATE (PF) 4 MG/ML IV SOLN
4.0000 mg | Freq: Once | INTRAVENOUS | Status: AC
  Administered 2016-08-13: 4 mg via INTRAVENOUS
  Filled 2016-08-13: qty 1

## 2016-08-13 MED ORDER — ONDANSETRON HCL 4 MG/2ML IJ SOLN
4.0000 mg | Freq: Once | INTRAMUSCULAR | Status: AC
  Administered 2016-08-13: 4 mg via INTRAVENOUS
  Filled 2016-08-13: qty 2

## 2016-08-13 MED ORDER — HYDROCODONE-ACETAMINOPHEN 5-325 MG PO TABS: 1.0000 | ORAL_TABLET | ORAL | 0 refills | Status: DC | PRN

## 2016-08-13 NOTE — ED Triage Notes (Signed)
Pt c/o severe headaches x several days, pt's prescriptions from a few weeks ago have been unable to help. Pt also c/o productive cough with green phlegm x 1 week, SOB. Hx COPD, lung cancer. No chemo or radiation currently.

## 2016-08-13 NOTE — ED Notes (Signed)
Patient transported to X-ray 

## 2016-08-13 NOTE — Telephone Encounter (Signed)
Duplicate message.  Prednisone and theophyline was sent to the pharmacy yesterday for the pt.

## 2016-08-13 NOTE — ED Provider Notes (Signed)
[facility] DEPT Provider Note   CSN: [id number] Arrival date & time: 08/13/16  1240     History   Chief Complaint Chief Complaint  Patient presents with  . Migraine  . Shortness of Breath    HPI Ernest [name] is a 60 y.o. male.  Pt has a hx of metastatic lung cancer and presents today with continued headache.  He has had this headache for weeks.  He has seen his PCP for this and had an MRI on 8/29 which showed no met, but did show a tiny subacute infarct L precentral gyrus.  The pt's PCP last saw him 9/7 and changed his meds around.  Pt said that he has not improved.  He said that he also feels sob.  The pt is not currently getting chemo or radiation for his lung cancer.  He was recently evaluated for a trial at [facility], but was not a candidate for it.      Past Medical History:  Diagnosis Date  . Anxiety   . CAD (coronary artery disease)    Left Main 30% stenosis, LAD 20 - 30 % stenosis, first and second diagonal branchesat 40 - 50%  stenosis with small arteries, circumflex had 30% stenosis in the large obtuse marginal, RCA at 70 - 80%  stenosis [not felt to be occlusive after evaluation with flow wire], distal 50 - 60% stenosis - [name][  . Cancer ([place])    lung  . Chronic insomnia   . COPD (chronic obstructive pulmonary disease) (HCC)    Dr. Baird Lyons  . COPD with asthma ([place]) 09/08/2007  . DDD (degenerative disc disease)   . Depression   . Diabetes mellitus without complication ([place]) 1/49/7026   no medications now 07/24/2016  . GERD (gastroesophageal reflux disease)   . Gout   . History of cardiovascular stress test    Myoview 7/16:  Diaphragmatic attenuation, no ischemia, EF 56%; Low Risk  . History of radiation therapy 11/10/13- 12/29/13   left lung 6600 cGy in 33 sessions  . Hx of colonoscopy   . Hyperlipidemia   . Hypertension    dr Percival Spanish  . Itching due to drug 03/19/2016  . Lung cancer ([place]) 10/04/13   LUL squamous cell lung cancer  .  Nausea with vomiting 03/26/2016  . OSA (obstructive sleep apnea)    NPSG 09/10/10- AHI 11.3/hr  . PVD (peripheral vascular disease) ([place])    PTA/Stent right common iliac    Patient Active Problem List   Diagnosis Date Noted  . Insomnia due to anxiety and fear 08/01/2016  . Therapeutic opioid-induced constipation (OIC) 07/18/2016  . Muscle contraction headache 07/18/2016  . Encounter for antineoplastic chemotherapy 02/20/2016  . 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 Rhinitis, nonallergic, chronic 09/22/2015  . 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 Claudication ([place]) 08/10/2015  . Coronary artery disease involving native coronary artery of native heart without angina pectoris 05/17/2015  . Diabetes mellitus without complication ([place]) 37/85/8850  . Chronic respiratory failure with hypercapnia ([place])   . Chronic pain syndrome   . Chronic respiratory failure with hypoxia ([place]) 12/15/2014  . Non-small cell carcinoma of lung, right ([place]) 10/07/2013  . Preventative health care 08/01/2011  . Obstructive sleep apnea 09/24/2010  . Peripheral vascular disease ([place]) 05/15/2010  . Hyperlipidemia with target LDL less than 70 05/16/2009  . GOUT 09/08/2007  . Depression with anxiety 09/08/2007  . Essential hypertension 09/08/2007  . COPD mixed type ([place]) 09/08/2007  . GERD 09/08/2007  Past Surgical History:  Procedure Laterality Date  . ANGIOPLASTY    . ANTERIOR FUSION CERVICAL SPINE     cervical fusion  7 yrs ago ([facility])  . arm surgery     left elbow  . BACK SURGERY     lower  . c-spine surgery    . COLON SURGERY     '11 "Diverticulitis"  . COLONOSCOPY WITH PROPOFOL N/A 05/22/2015   Procedure: COLONOSCOPY WITH PROPOFOL;  Surgeon: Inda Castle, MD;  Location: [facility];  Service: Endoscopy;  Laterality: N/A;  . HIP SURGERY     left 'bone graft taken"  . PERIPHERAL VASCULAR CATHETERIZATION N/A 08/10/2015   Procedure: Lower Extremity Angiography;  Surgeon: Lorretta Harp, MD;  Distal Ao OK, L-EIA stent OK, R-CIA 100% s/p overlapping 7 mm x 38 mm ICast stents, 50-60% R-CFA      . SHOULDER SURGERY     right  . SPINAL FUSION  03/05/2007   L4-L5  . VIDEO BRONCHOSCOPY WITH ENDOBRONCHIAL NAVIGATION N/A 10/04/2013   Procedure: VIDEO BRONCHOSCOPY WITH ENDOBRONCHIAL NAVIGATION;  Surgeon: Collene Gobble, MD;  Location: [facility];  Service: Thoracic;  Laterality: N/A;       Home Medications    Prior to Admission medications   Medication Sig Start Date End Date Taking? Authorizing Provider  ADVAIR DISKUS 500-50 MCG/DOSE AEPB 1 PUFF THEN RINSE MOUTH, TWICE DAILY MAINTENANCE 04/26/16   Deneise Lever, MD  albuterol (PROVENTIL) (2.5 MG/3ML) 0.083% nebulizer solution INHALE 1 VIAL IN NEBULIZER EVERY 4 HOURS AS NEEDED FOR WHEEZING OR SHORTNESS OF BREATH 06/24/16   Deneise Lever, MD  ARIPiprazole (ABILIFY) 2 MG tablet Take 2 mg by mouth at bedtime.  05/22/16   Historical Provider, MD  Armodafinil 250 MG tablet Take 250 mg by mouth every morning. 03/08/16   Historical Provider, MD  aspirin 81 MG EC tablet TAKE 1 TABLET BY MOUTH EVERY DAY 07/07/16   Janith Lima, MD  atorvastatin (LIPITOR) 20 MG tablet TAKE 1 TABLET EVERY DAY Patient taking differently: TAKE 20 MG  EVERY DAY IN THE EVENING 08/01/15   Dorena Cookey, MD  BELSOMRA 20 MG TABS Take 20 mg by mouth at bedtime as needed (for sleep). Reported on 03/29/2016 12/26/15   Historical Provider, MD  butalbital-acetaminophen-caffeine (FIORICET, ESGIC) 50-325-40 MG tablet Take 1-2 tablets by mouth every 6 (six) hours as needed for headache or migraine. 08/09/16 09/08/16  Flossie Buffy, NP  clonazePAM (KLONOPIN) 1 MG tablet Take 2 mg by mouth at bedtime as needed for anxiety. Reported on 01/16/2016    Historical Provider, MD  clopidogrel (PLAVIX) 75 MG tablet Take 1 tablet (75 mg total) by mouth daily. 08/11/15   Almyra Deforest, PA  dextromethorphan-guaiFENesin [facility] DM) 30-600 MG 12hr tablet Take 1 tablet by mouth 2 (two) times daily as needed  for cough (congestion).    Historical Provider, MD  esomeprazole (NEXIUM) 40 MG capsule TAKE ONE CAPSULE BY MOUTH EVERY DAY Patient taking differently: TAKE 40 MG  BY MOUTH EVERY DAY 05/06/16   Janith Lima, MD  feeding supplement, ENSURE ENLIVE, (ENSURE ENLIVE) LIQD Take 237 mLs by mouth 2 (two) times daily between meals. 05/23/16   Nishant Dhungel, MD  FLORA-Q (FLORA-Q) CAPS capsule Take 1 capsule by mouth daily. 04/25/16   Silver Huguenin Elgergawy, MD  FLUoxetine (PROZAC) 40 MG capsule Take 40 mg by mouth every morning.    Historical Provider, MD  gabapentin (NEURONTIN) 100 MG capsule Take 100 mg by mouth daily.  07/15/15   Historical Provider, MD  glipiZIDE (GLUCOTROL) 5 MG tablet Take 0.5 tablets (2.5 mg total) by mouth daily before breakfast. 05/07/16   Janith Lima, MD  HYDROcodone-acetaminophen (NORCO/VICODIN) 5-325 MG tablet Take 1 tablet by mouth every 4 (four) hours as needed. 08/13/16   Isla Pence, MD  ibuprofen (ADVIL,MOTRIN) 600 MG tablet Take 1 tablet (600 mg total) by mouth every 8 (eight) hours as needed. 08/01/16   Janith Lima, MD  isosorbide mononitrate (IMDUR) 120 MG 24 hr tablet  07/21/16   Historical Provider, MD  KLOR-CON M20 20 MEQ tablet TAKE 1 TABLET BY MOUTH EVERY MORNING 07/22/16   Janith Lima, MD  Methylnaltrexone Bromide (RELISTOR) 150 MG TABS Take 3 tablets by mouth every morning. 07/18/16   Janith Lima, MD  metoprolol succinate (TOPROL-XL) 25 MG 24 hr tablet  07/22/16   Historical Provider, MD  mirtazapine (REMERON) 45 MG tablet Take 1 tablet (45 mg total) by mouth at bedtime. 12/29/14   Tammy S Parrett, NP  morphine (MS CONTIN) 30 MG 12 hr tablet Take 1 tablet (30 mg total) by mouth every 12 (twelve) hours. 07/09/16   Janith Lima, MD  morphine (MSIR) 15 MG tablet Take 1 tablet (15 mg total) by mouth every 4 (four) hours as needed for severe pain. 07/09/16   Janith Lima, MD  nitroGLYCERIN (NITROSTAT) 0.4 MG SL tablet Place 1 tablet (0.4 mg total) under the tongue  every 5 (five) minutes as needed for chest pain. 08/09/14   Minus Breeding, MD  ondansetron (ZOFRAN) 8 MG tablet TAKE 1 TABLET (8 MG TOTAL) BY MOUTH EVERY 8 (EIGHT) HOURS AS NEEDED FOR NAUSEA OR VOMITING. 06/20/16   Curt Bears, MD  OXYGEN Inhale 2 L into the lungs daily.     Historical Provider, MD  pantoprazole (PROTONIX) 40 MG tablet TAKE 1 TABLET (40 MG TOTAL) BY MOUTH DAILY. 07/13/16   Historical Provider, MD  predniSONE (STERAPRED UNI-PAK 21 TAB) 10 MG (21) TBPK tablet Take 1 tablet (10 mg total) by mouth daily. Take 6 tabs by mouth daily  for 2 days, then 5 tabs for 2 days, then 4 tabs for 2 days, then 3 tabs for 2 days, 2 tabs for 2 days, then 1 tab by mouth daily for 2 days 08/13/16   Isla Pence, MD  prochlorperazine (COMPAZINE) 10 MG tablet TAKE 1 TABLET EVERY 6 HOURS AS NEEDED FOR NAUSEA AND VOMITING 07/19/16   Curt Bears, MD  scopolamine (TRANSDERM-SCOP) 1 MG/3DAYS Place 1 patch (1.5 mg total) onto the skin every 3 (three) days. 06/25/16   Deneise Lever, MD  theophylline (UNIPHYL) 400 MG 24 hr tablet Take 1 tablet (400 mg total) by mouth daily. 08/12/16   Deneise Lever, MD  Tiotropium Bromide Monohydrate (SPIRIVA RESPIMAT) 1.25 MCG/ACT AERS Inhale 2 puffs into the lungs daily. 04/16/16   Janith Lima, MD  traZODone (DESYREL) 150 MG tablet Take 1 tablet (150 mg total) by mouth at bedtime. 08/01/16   Janith Lima, MD  VENTOLIN HFA 108 (90 Base) MCG/ACT inhaler INHALE 2 PUFFS INTO THE LUNGS 4 TIMES DAILY AS NEEDED FOR WHEEZING 06/03/16   Deneise Lever, MD    Family History Family History  Problem Relation Age of Onset  . Heart attack Mother   . Heart attack Sister   . Lung cancer Sister   . Cancer Sister     small cell lung, mets to brain  . Hypertension Brother   .  Emphysema Sister   . Stroke Neg Hx     Social History Social History  Substance Use Topics  . Smoking status: Former Smoker    Packs/day: 0.00    Years: 43.00    Types: Cigarettes    Quit date:  09/23/2013  . Smokeless tobacco: Never Used     Comment: history of 3 PPD, 11/02/13   . Alcohol use No     Allergies   Carboplatin   Review of Systems Review of Systems  Respiratory: Positive for shortness of breath.   Neurological: Positive for headaches.  All other systems reviewed and are negative.    Physical Exam Updated Vital Signs BP 119/55   Pulse 71   Temp 97.9 F (36.6 C) (Oral)   Resp 17   SpO2 100%   Physical Exam  Constitutional: He is oriented to person, place, and time. He appears well-developed and well-nourished.  HENT:  Head: Normocephalic and atraumatic.  Right Ear: External ear normal.  Left Ear: External ear normal.  Nose: Nose normal.  Mouth/Throat: Oropharynx is clear and moist.  Eyes: Conjunctivae and EOM are normal. Pupils are equal, round, and reactive to light.  Neck: Normal range of motion. Neck supple.  Cardiovascular: Normal rate, regular rhythm, normal heart sounds and intact distal pulses.   Pulmonary/Chest: Effort normal. He has wheezes.  Abdominal: Soft. Bowel sounds are normal.  Musculoskeletal: Normal range of motion.  Neurological: He is alert and oriented to person, place, and time.  Skin: Skin is warm and dry.  Psychiatric: He has a normal mood and affect. His behavior is normal. Judgment and thought content normal.  Nursing note and vitals reviewed.    ED Treatments / Results  Labs (all labs ordered are listed, but only abnormal results are displayed) Labs Reviewed  BASIC METABOLIC PANEL  CBC  URINALYSIS, ROUTINE W REFLEX MICROSCOPIC (NOT AT [facility])  Randolm Idol, ED    EKG  EKG Interpretation None       Radiology Dg Chest 2 View  Result Date: 08/13/2016 CLINICAL DATA:  Pt c/o severe headaches x several days. Pt also c/o productive cough with green phlegm x 1 week, SOB. Hx COPD, lung cancer. No chemo or radiation currently. EXAM: CHEST  2 VIEW COMPARISON:  Chest CT, 07/24/2016.  Chest radiographs,  07/18/2016. FINDINGS: Large right lower lobe mass is without change from the recent prior exams. Right peritracheal adenopathy is stable. Left upper lobe pleural parenchymal scarring is without change. There are no new areas of lung opacification. No pleural effusion or pneumothorax. Cardiac silhouette is top-normal in size. Skeletal structures are demineralized. There stable changes from a prior anterior cervical spine fusion. IMPRESSION: 1. No acute cardiopulmonary disease. 2. Stable changes of lung carcinoma, index mass in the right lower lobe, with right peritracheal metastatic adenopathy. 3. Stable left upper lobe pleural parenchymal scarring. Electronically Signed   By: Lajean Manes M.D.   On: 08/13/2016 14:01    Procedures Procedures (including critical care time)  Medications Ordered in ED Medications  fentaNYL (SUBLIMAZE) injection 50 mcg (not administered)  sodium chloride 0.9 % bolus 1,000 mL (1,000 mLs Intravenous New Bag/Given 08/13/16 1356)  morphine 4 MG/ML injection 4 mg (4 mg Intravenous Given 08/13/16 1402)  ondansetron (ZOFRAN) injection 4 mg (4 mg Intravenous Given 08/13/16 1358)  methylPREDNISolone sodium succinate (SOLU-MEDROL) 125 mg/2 mL injection 125 mg (125 mg Intravenous Given 08/13/16 1358)  ipratropium-albuterol (DUONEB) 0.5-2.5 (3) MG/3ML nebulizer solution 3 mL (3 mLs Nebulization Given 08/13/16 1317)  Initial Impression / Assessment and Plan / ED Course  I have reviewed the triage vital signs and the nursing notes.  Pertinent labs & imaging results that were available during my care of the patient were reviewed by me and considered in my medical decision making (see chart for details).  Clinical Course    Pt is feeling some better.  I gave him the number for neurology to f/u with as his headaches have been going on for weeks.  Pt knows to return if worse.  Final Clinical Impressions(s) / ED Diagnoses   Final diagnoses:  Acute nonintractable headache,  unspecified headache type  Malignant neoplasm of lung, unspecified laterality, unspecified part of lung (HCC)  COPD exacerbation (HCC)    New Prescriptions New Prescriptions   HYDROCODONE-ACETAMINOPHEN (NORCO/VICODIN) 5-325 MG TABLET    Take 1 tablet by mouth every 4 (four) hours as needed.   PREDNISONE (STERAPRED UNI-PAK 21 TAB) 10 MG (21) TBPK TABLET    Take 1 tablet (10 mg total) by mouth daily. Take 6 tabs by mouth daily  for 2 days, then 5 tabs for 2 days, then 4 tabs for 2 days, then 3 tabs for 2 days, 2 tabs for 2 days, then 1 tab by mouth daily for 2 days     Isla Pence, MD 08/13/16 1525

## 2016-08-16 ENCOUNTER — Telehealth: Payer: Self-pay | Admitting: Internal Medicine

## 2016-08-16 MED ORDER — PREDNISONE 10 MG PO TABS: ORAL_TABLET | ORAL | 0 refills | Status: DC

## 2016-08-16 NOTE — Telephone Encounter (Signed)
Called spoke with pt spouse Rodena Piety. She reports pt is having increase SOB, HA, wheezing, chest tightness, slight dry cough. Reports pt has been struggling this week. No openings today. Please advise Dr. Annamaria Boots thanks  Allergies  Allergen Reactions  . Carboplatin Shortness Of Breath, Swelling and Rash    Swelling of lips, rash on face,eyes and head     Current Outpatient Prescriptions on File Prior to Visit  Medication Sig Dispense Refill  . ADVAIR DISKUS 500-50 MCG/DOSE AEPB 1 PUFF THEN RINSE MOUTH, TWICE DAILY MAINTENANCE 60 each 2  . albuterol (PROVENTIL) (2.5 MG/3ML) 0.083% nebulizer solution INHALE 1 VIAL IN NEBULIZER EVERY 4 HOURS AS NEEDED FOR WHEEZING OR SHORTNESS OF BREATH 300 mL 2  . ARIPiprazole (ABILIFY) 2 MG tablet Take 2 mg by mouth at bedtime.   4  . Armodafinil 250 MG tablet Take 250 mg by mouth every morning.  5  . aspirin 81 MG EC tablet TAKE 1 TABLET BY MOUTH EVERY DAY 30 tablet 5  . atorvastatin (LIPITOR) 20 MG tablet TAKE 1 TABLET EVERY DAY (Patient taking differently: TAKE 20 MG  EVERY DAY IN THE EVENING) 90 tablet 3  . BELSOMRA 20 MG TABS Take 20 mg by mouth at bedtime as needed (for sleep). Reported on 03/29/2016  4  . butalbital-acetaminophen-caffeine (FIORICET, ESGIC) 50-325-40 MG tablet Take 1-2 tablets by mouth every 6 (six) hours as needed for headache or migraine. 30 tablet 0  . clonazePAM (KLONOPIN) 1 MG tablet Take 2 mg by mouth at bedtime as needed for anxiety. Reported on 01/16/2016    . clopidogrel (PLAVIX) 75 MG tablet Take 1 tablet (75 mg total) by mouth daily. 90 tablet 3  . dextromethorphan-guaiFENesin (MUCINEX DM) 30-600 MG 12hr tablet Take 1 tablet by mouth 2 (two) times daily as needed for cough (congestion).    [name] esomeprazole (NEXIUM) 40 MG capsule TAKE ONE CAPSULE BY MOUTH EVERY DAY (Patient taking differently: TAKE 40 MG  BY MOUTH EVERY DAY) 90 capsule 3  . feeding supplement, ENSURE ENLIVE, (ENSURE ENLIVE) LIQD Take 237 mLs by mouth 2 (two) times daily  between meals. 237 mL 12  . FLORA-Q (FLORA-Q) CAPS capsule Take 1 capsule by mouth daily. 20 capsule 0  . FLUoxetine (PROZAC) 40 MG capsule Take 40 mg by mouth every morning.    . gabapentin (NEURONTIN) 100 MG capsule Take 100 mg by mouth daily.     [name] glipiZIDE (GLUCOTROL) 5 MG tablet Take 0.5 tablets (2.5 mg total) by mouth daily before breakfast. 30 tablet 5  . HYDROcodone-acetaminophen (NORCO/VICODIN) 5-325 MG tablet Take 1 tablet by mouth every 4 (four) hours as needed. 10 tablet 0  . ibuprofen (ADVIL,MOTRIN) 600 MG tablet Take 1 tablet (600 mg total) by mouth every 8 (eight) hours as needed. 90 tablet 3  . isosorbide mononitrate (IMDUR) 120 MG 24 hr tablet     . KLOR-CON M20 20 MEQ tablet TAKE 1 TABLET BY MOUTH EVERY MORNING 90 tablet 1  . Methylnaltrexone Bromide (RELISTOR) 150 MG TABS Take 3 tablets by mouth every morning. 90 tablet 11  . metoprolol succinate (TOPROL-XL) 25 MG 24 hr tablet     . mirtazapine (REMERON) 45 MG tablet Take 1 tablet (45 mg total) by mouth at bedtime.    [name] morphine (MS CONTIN) 30 MG 12 hr tablet Take 1 tablet (30 mg total) by mouth every 12 (twelve) hours. 60 tablet 0  . morphine (MSIR) 15 MG tablet Take 1 tablet (15 mg total) by mouth every  4 (four) hours as needed for severe pain. 30 tablet 0  . nitroGLYCERIN (NITROSTAT) 0.4 MG SL tablet Place 1 tablet (0.4 mg total) under the tongue every 5 (five) minutes as needed for chest pain. 25 tablet 3  . ondansetron (ZOFRAN) 8 MG tablet TAKE 1 TABLET (8 MG TOTAL) BY MOUTH EVERY 8 (EIGHT) HOURS AS NEEDED FOR NAUSEA OR VOMITING. 20 tablet 0  . OXYGEN Inhale 2 L into the lungs daily.     . pantoprazole (PROTONIX) 40 MG tablet TAKE 1 TABLET (40 MG TOTAL) BY MOUTH DAILY.  11  . predniSONE (STERAPRED UNI-PAK 21 TAB) 10 MG (21) TBPK tablet Take 1 tablet (10 mg total) by mouth daily. Take 6 tabs by mouth daily  for 2 days, then 5 tabs for 2 days, then 4 tabs for 2 days, then 3 tabs for 2 days, 2 tabs for 2 days, then 1 tab by  mouth daily for 2 days 42 tablet 0  . prochlorperazine (COMPAZINE) 10 MG tablet TAKE 1 TABLET EVERY 6 HOURS AS NEEDED FOR NAUSEA AND VOMITING 30 tablet 1  . scopolamine (TRANSDERM-SCOP) 1 MG/3DAYS Place 1 patch (1.5 mg total) onto the skin every 3 (three) days. 4 patch 0  . theophylline (UNIPHYL) 400 MG 24 hr tablet Take 1 tablet (400 mg total) by mouth daily. 30 tablet 5  . Tiotropium Bromide Monohydrate (SPIRIVA RESPIMAT) 1.25 MCG/ACT AERS Inhale 2 puffs into the lungs daily. 4 g 11  . traZODone (DESYREL) 150 MG tablet Take 1 tablet (150 mg total) by mouth at bedtime. 90 tablet 3  . VENTOLIN HFA 108 (90 Base) MCG/ACT inhaler INHALE 2 PUFFS INTO THE LUNGS 4 TIMES DAILY AS NEEDED FOR WHEEZING 18 Inhaler 2   No current facility-administered medications on file prior to visit.   '

## 2016-08-16 NOTE — Telephone Encounter (Signed)
Suggest prednisone 10 mg, # 20, 4 X 2 DAYS, 3 X 2 DAYS, 2 X 2 DAYS, 1 X 2 DAYS  If she thinks they will need something more, please let me know

## 2016-08-16 NOTE — Telephone Encounter (Signed)
Spoke with spouse regarding recs. Rx sent in. Nothing further needed

## 2016-08-18 DIAGNOSIS — J449 Chronic obstructive pulmonary disease, unspecified: Secondary | ICD-10-CM | POA: Diagnosis not present

## 2016-08-19 ENCOUNTER — Telehealth: Payer: Self-pay | Admitting: Internal Medicine

## 2016-08-19 NOTE — Telephone Encounter (Signed)
Called spoke with Tammy. She states she wanted to call and make our office aware that the patient is feeling much better since starting steroids and nothing further is needed at this time. She voiced understanding and had no further questions. Nothing further needed.

## 2016-08-24 DIAGNOSIS — C342 Malignant neoplasm of middle lobe, bronchus or lung: Secondary | ICD-10-CM | POA: Diagnosis not present

## 2016-08-24 DIAGNOSIS — J9601 Acute respiratory failure with hypoxia: Secondary | ICD-10-CM | POA: Diagnosis not present

## 2016-08-24 DIAGNOSIS — J449 Chronic obstructive pulmonary disease, unspecified: Secondary | ICD-10-CM | POA: Diagnosis not present

## 2016-08-26 DIAGNOSIS — F3341 Major depressive disorder, recurrent, in partial remission: Secondary | ICD-10-CM | POA: Diagnosis not present

## 2016-08-27 ENCOUNTER — Ambulatory Visit (INDEPENDENT_AMBULATORY_CARE_PROVIDER_SITE_OTHER): Payer: Commercial Managed Care - HMO | Admitting: Internal Medicine

## 2016-08-27 ENCOUNTER — Encounter: Payer: Self-pay | Admitting: Internal Medicine

## 2016-08-27 VITALS — BP 140/64 | HR 84 | Temp 98.1°F | Resp 20 | Ht 66.0 in | Wt 181.5 lb

## 2016-08-27 DIAGNOSIS — J0101 Acute recurrent maxillary sinusitis: Secondary | ICD-10-CM | POA: Diagnosis not present

## 2016-08-27 DIAGNOSIS — J301 Allergic rhinitis due to pollen: Secondary | ICD-10-CM

## 2016-08-27 DIAGNOSIS — G894 Chronic pain syndrome: Secondary | ICD-10-CM

## 2016-08-27 DIAGNOSIS — G501 Atypical facial pain: Secondary | ICD-10-CM

## 2016-08-27 DIAGNOSIS — G893 Neoplasm related pain (acute) (chronic): Secondary | ICD-10-CM | POA: Diagnosis not present

## 2016-08-27 MED ORDER — MORPHINE SULFATE ER 30 MG PO TBCR: 30.0000 mg | EXTENDED_RELEASE_TABLET | Freq: Two times a day (BID) | ORAL | 0 refills | Status: DC

## 2016-08-27 MED ORDER — FLUNISOLIDE 25 MCG/ACT (0.025%) NA SOLN: 2.0000 | Freq: Two times a day (BID) | NASAL | 11 refills | Status: DC

## 2016-08-27 MED ORDER — AMOXICILLIN-POT CLAVULANATE 875-125 MG PO TABS: 1.0000 | ORAL_TABLET | Freq: Two times a day (BID) | ORAL | 1 refills | Status: AC

## 2016-08-27 MED ORDER — GABAPENTIN ENACARBIL ER 300 MG PO TBCR: 1.0000 | EXTENDED_RELEASE_TABLET | Freq: Every day | ORAL | 0 refills | Status: DC

## 2016-08-27 MED ORDER — MORPHINE SULFATE 15 MG PO TABS: 15.0000 mg | ORAL_TABLET | ORAL | 0 refills | Status: DC | PRN

## 2016-08-27 NOTE — Patient Instructions (Signed)

## 2016-08-27 NOTE — Progress Notes (Signed)
Pre visit review using our clinic review tool, if applicable. No additional management support is needed unless otherwise documented below in the visit note. 

## 2016-08-27 NOTE — Progress Notes (Signed)
Subjective:  Patient ID: Ernest [name], male    DOB: 11-09-1956  Age: 60 y.o. MRN: [id number]  CC: Allergic Rhinitis ; Sinusitis; and Headache   HPI Ernest [name] presents for f/up. He complains of ongoing headache and right facial pain. This is been thoroughly evaluated with an MRI which was normal. He has tried generic gabapentin with no relief from the pain. He describes the pain as being around his right forehead, right eye and right face. He has had this pain for several months. He denies nausea, vomiting, paresthesias, or visual disturbance.  Now for the last 2 weeks he also complains of right facial pain, thick yellow nasal phlegm, nasal congestion, postnasal drip, and sneezing. He is taking steroids which has helped some. He denies blood from his nose, off, sore throat, fever, or chills.  Outpatient Medications Prior to Visit  Medication Sig Dispense Refill  . ADVAIR DISKUS 500-50 MCG/DOSE AEPB 1 PUFF THEN RINSE MOUTH, TWICE DAILY MAINTENANCE 60 each 2  . albuterol (PROVENTIL) (2.5 MG/3ML) 0.083% nebulizer solution INHALE 1 VIAL IN NEBULIZER EVERY 4 HOURS AS NEEDED FOR WHEEZING OR SHORTNESS OF BREATH 300 mL 2  . ARIPiprazole (ABILIFY) 2 MG tablet Take 2 mg by mouth at bedtime.   4  . Armodafinil 250 MG tablet Take 250 mg by mouth every morning.  5  . aspirin 81 MG EC tablet TAKE 1 TABLET BY MOUTH EVERY DAY 30 tablet 5  . atorvastatin (LIPITOR) 20 MG tablet TAKE 1 TABLET EVERY DAY (Patient taking differently: TAKE 20 MG  EVERY DAY IN THE EVENING) 90 tablet 3  . butalbital-acetaminophen-caffeine (FIORICET, ESGIC) 50-325-40 MG tablet Take 1-2 tablets by mouth every 6 (six) hours as needed for headache or migraine. 30 tablet 0  . clonazePAM (KLONOPIN) 1 MG tablet Take 2 mg by mouth at bedtime as needed for anxiety. Reported on 01/16/2016    . clopidogrel (PLAVIX) 75 MG tablet Take 1 tablet (75 mg total) by mouth daily. 90 tablet 3  . dextromethorphan-guaiFENesin (MUCINEX DM)  30-600 MG 12hr tablet Take 1 tablet by mouth 2 (two) times daily as needed for cough (congestion).    [name] esomeprazole (NEXIUM) 40 MG capsule TAKE ONE CAPSULE BY MOUTH EVERY DAY (Patient taking differently: TAKE 40 MG  BY MOUTH EVERY DAY) 90 capsule 3  . feeding supplement, ENSURE ENLIVE, (ENSURE ENLIVE) LIQD Take 237 mLs by mouth 2 (two) times daily between meals. 237 mL 12  . FLORA-Q (FLORA-Q) CAPS capsule Take 1 capsule by mouth daily. 20 capsule 0  . FLUoxetine (PROZAC) 40 MG capsule Take 40 mg by mouth every morning.    [name] glipiZIDE (GLUCOTROL) 5 MG tablet Take 0.5 tablets (2.5 mg total) by mouth daily before breakfast. 30 tablet 5  . ibuprofen (ADVIL,MOTRIN) 600 MG tablet Take 1 tablet (600 mg total) by mouth every 8 (eight) hours as needed. 90 tablet 3  . isosorbide mononitrate (IMDUR) 120 MG 24 hr tablet     . KLOR-CON M20 20 MEQ tablet TAKE 1 TABLET BY MOUTH EVERY MORNING 90 tablet 1  . Methylnaltrexone Bromide (RELISTOR) 150 MG TABS Take 3 tablets by mouth every morning. 90 tablet 11  . metoprolol succinate (TOPROL-XL) 25 MG 24 hr tablet     . mirtazapine (REMERON) 45 MG tablet Take 1 tablet (45 mg total) by mouth at bedtime.    . ondansetron (ZOFRAN) 8 MG tablet TAKE 1 TABLET (8 MG TOTAL) BY MOUTH EVERY 8 (EIGHT) HOURS AS NEEDED FOR NAUSEA  OR VOMITING. 20 tablet 0  . OXYGEN Inhale 2 L into the lungs daily.     . pantoprazole (PROTONIX) 40 MG tablet TAKE 1 TABLET (40 MG TOTAL) BY MOUTH DAILY.  11  . predniSONE (DELTASONE) 10 MG tablet Take 4 tabs daily x 2 days, 3 tabs daily x 2 days, 2 tabs daily x 2 days, 1 tab daily x 2 days then stop 20 tablet 0  . predniSONE (STERAPRED UNI-PAK 21 TAB) 10 MG (21) TBPK tablet Take 1 tablet (10 mg total) by mouth daily. Take 6 tabs by mouth daily  for 2 days, then 5 tabs for 2 days, then 4 tabs for 2 days, then 3 tabs for 2 days, 2 tabs for 2 days, then 1 tab by mouth daily for 2 days 42 tablet 0  . prochlorperazine (COMPAZINE) 10 MG tablet TAKE 1 TABLET  EVERY 6 HOURS AS NEEDED FOR NAUSEA AND VOMITING 30 tablet 1  . theophylline (UNIPHYL) 400 MG 24 hr tablet Take 1 tablet (400 mg total) by mouth daily. 30 tablet 5  . Tiotropium Bromide Monohydrate (SPIRIVA RESPIMAT) 1.25 MCG/ACT AERS Inhale 2 puffs into the lungs daily. 4 g 11  . traZODone (DESYREL) 150 MG tablet Take 1 tablet (150 mg total) by mouth at bedtime. 90 tablet 3  . VENTOLIN HFA 108 (90 Base) MCG/ACT inhaler INHALE 2 PUFFS INTO THE LUNGS 4 TIMES DAILY AS NEEDED FOR WHEEZING 18 Inhaler 2  . BELSOMRA 20 MG TABS Take 20 mg by mouth at bedtime as needed (for sleep). Reported on 03/29/2016  4  . gabapentin (NEURONTIN) 100 MG capsule Take 100 mg by mouth daily.     [name] HYDROcodone-acetaminophen (NORCO/VICODIN) 5-325 MG tablet Take 1 tablet by mouth every 4 (four) hours as needed. 10 tablet 0  . morphine (MS CONTIN) 30 MG 12 hr tablet Take 1 tablet (30 mg total) by mouth every 12 (twelve) hours. 60 tablet 0  . morphine (MSIR) 15 MG tablet Take 1 tablet (15 mg total) by mouth every 4 (four) hours as needed for severe pain. 30 tablet 0  . nitroGLYCERIN (NITROSTAT) 0.4 MG SL tablet Place 1 tablet (0.4 mg total) under the tongue every 5 (five) minutes as needed for chest pain. (Patient not taking: Reported on 08/27/2016) 25 tablet 3  . scopolamine (TRANSDERM-SCOP) 1 MG/3DAYS Place 1 patch (1.5 mg total) onto the skin every 3 (three) days. (Patient not taking: Reported on 08/27/2016) 4 patch 0   No facility-administered medications prior to visit.     ROS Review of Systems  Constitutional: Negative for activity change, appetite change, chills, diaphoresis, fatigue and fever.  HENT: Positive for congestion, sinus pressure and sneezing. Negative for facial swelling, sore throat, trouble swallowing and voice change.   Eyes: Negative.   Respiratory: Positive for shortness of breath. Negative for apnea, cough, choking, chest tightness, wheezing and stridor.   Cardiovascular: Positive for chest pain.  Negative for palpitations and leg swelling.  Gastrointestinal: Negative.  Negative for abdominal pain, constipation, diarrhea, nausea and vomiting.  Endocrine: Negative.   Genitourinary: Negative.   Musculoskeletal: Positive for arthralgias and neck pain. Negative for back pain and myalgias.  Skin: Negative.  Negative for color change, pallor and rash.  Allergic/Immunologic: Negative.   Neurological: Negative.   Hematological: Negative.  Negative for adenopathy. Does not bruise/bleed easily.  Psychiatric/Behavioral: Negative.  Negative for decreased concentration, dysphoric mood and sleep disturbance. The patient is not nervous/anxious.     Objective:  BP 140/64 (BP Location:  Left Arm, Patient Position: Sitting, Cuff Size: Normal)   Pulse 84   Temp 98.1 F (36.7 C) (Oral)   Resp 20   Ht '5\' 6"'$  (1.676 m)   Wt 181 lb 8 oz (82.3 kg)   SpO2 94%   BMI 29.29 kg/m   BP Readings from Last 3 Encounters:  08/27/16 140/64  08/13/16 113/60  08/09/16 122/64    Wt Readings from Last 3 Encounters:  08/27/16 181 lb 8 oz (82.3 kg)  08/09/16 184 lb (83.5 kg)  08/06/16 183 lb 9.6 oz (83.3 kg)    Physical Exam  Constitutional: He is oriented to person, place, and time.  Non-toxic appearance. He has a sickly appearance. He appears ill. No distress.  HENT:  Head: Normocephalic and atraumatic.  Mouth/Throat: Oropharynx is clear and moist. No oropharyngeal exudate.  Eyes: Conjunctivae are normal. Right eye exhibits no discharge. Left eye exhibits no discharge. No scleral icterus.  Neck: Normal range of motion. Neck supple. No JVD present. No tracheal deviation present. No thyromegaly present.  Cardiovascular: Normal rate, regular rhythm, normal heart sounds and intact distal pulses.  Exam reveals no gallop and no friction rub.   No murmur heard. Pulmonary/Chest: Accessory muscle usage present. No stridor. No tachypnea. No respiratory distress. He has no decreased breath sounds. He has no wheezes.  He has rhonchi in the right middle field and the left middle field. He has no rales. He exhibits no tenderness.  Abdominal: Soft. Bowel sounds are normal. He exhibits no distension and no mass. There is no tenderness. There is no rebound and no guarding.  Musculoskeletal: Normal range of motion. He exhibits no edema, tenderness or deformity.  Lymphadenopathy:    He has no cervical adenopathy.  Neurological: He is oriented to person, place, and time.  Skin: Skin is warm. No rash noted. He is not diaphoretic. No erythema. No pallor.  Vitals reviewed.   Lab Results  Component Value Date   WBC 10.7 (H) 08/13/2016   HGB 11.9 (L) 08/13/2016   HCT 37.5 (L) 08/13/2016   PLT 144 (L) 08/13/2016   GLUCOSE 82 08/13/2016   CHOL 164 10/05/2015   TRIG 174.0 (H) 10/05/2015   HDL 48.70 10/05/2015   LDLDIRECT 71.4 09/08/2012   LDLCALC 80 10/05/2015   ALT 14 08/06/2016   AST 16 08/06/2016   NA 137 08/13/2016   K 4.1 08/13/2016   CL 102 08/13/2016   CREATININE 0.89 08/13/2016   BUN 12 08/13/2016   CO2 26 08/13/2016   TSH 2.668 08/06/2016   PSA 0.64 07/18/2011   INR 1.14 04/21/2016   HGBA1C 8.1 (H) 04/22/2016   MICROALBUR 5.7 (H) 10/05/2015    Dg Chest 2 View  Result Date: 08/13/2016 CLINICAL DATA:  Pt c/o severe headaches x several days. Pt also c/o productive cough with green phlegm x 1 week, SOB. Hx COPD, lung cancer. No chemo or radiation currently. EXAM: CHEST  2 VIEW COMPARISON:  Chest CT, 07/24/2016.  Chest radiographs, 07/18/2016. FINDINGS: Large right lower lobe mass is without change from the recent prior exams. Right peritracheal adenopathy is stable. Left upper lobe pleural parenchymal scarring is without change. There are no new areas of lung opacification. No pleural effusion or pneumothorax. Cardiac silhouette is top-normal in size. Skeletal structures are demineralized. There stable changes from a prior anterior cervical spine fusion. IMPRESSION: 1. No acute cardiopulmonary disease.  2. Stable changes of lung carcinoma, index mass in the right lower lobe, with right peritracheal metastatic adenopathy. 3. Stable  left upper lobe pleural parenchymal scarring. Electronically Signed   By: Lajean Manes M.D.   On: 08/13/2016 14:01    Assessment & Plan:   Brycen was seen today for allergic rhinitis , sinusitis and headache.  Diagnoses and all orders for this visit:  Cancer associated pain -     morphine (MS CONTIN) 30 MG 12 hr tablet; Take 1 tablet (30 mg total) by mouth every 12 (twelve) hours. -     morphine (MSIR) 15 MG tablet; Take 1 tablet (15 mg total) by mouth every 4 (four) hours as needed for severe pain.  Chronic pain syndrome -     morphine (MS CONTIN) 30 MG 12 hr tablet; Take 1 tablet (30 mg total) by mouth every 12 (twelve) hours. -     morphine (MSIR) 15 MG tablet; Take 1 tablet (15 mg total) by mouth every 4 (four) hours as needed for severe pain.  Atypical facial pain- will try gabapentin again but this time in the form of Horizant which is formulated for better absorption and therefore better efficacy -     Gabapentin Enacarbil ER (HORIZANT) 300 MG TBCR; Take 1 capsule by mouth daily.  Acute recurrent maxillary sinusitis -     amoxicillin-clavulanate (AUGMENTIN) 875-125 MG tablet; Take 1 tablet by mouth 2 (two) times daily.  Acute seasonal allergic rhinitis due to pollen -     flunisolide (NASALIDE) 25 MCG/ACT (0.025%) SOLN; Place 2 sprays into the nose 2 (two) times daily.   I have discontinued Mr. Seese's gabapentin, BELSOMRA, scopolamine, and HYDROcodone-acetaminophen. I am also having him start on Gabapentin Enacarbil ER, amoxicillin-clavulanate, and flunisolide. Additionally, I am having him maintain his clonazePAM, nitroGLYCERIN, mirtazapine, FLUoxetine, atorvastatin, clopidogrel, OXYGEN, dextromethorphan-guaiFENesin, Armodafinil, Tiotropium Bromide Monohydrate, FLORA-Q, ADVAIR DISKUS, esomeprazole, glipiZIDE, feeding supplement (ENSURE ENLIVE),  VENTOLIN HFA, ARIPiprazole, ondansetron, albuterol, aspirin, Methylnaltrexone Bromide, prochlorperazine, KLOR-CON M20, isosorbide mononitrate, metoprolol succinate, pantoprazole, traZODone, ibuprofen, butalbital-acetaminophen-caffeine, theophylline, predniSONE, predniSONE, ALPRAZolam, morphine, and morphine.  Meds ordered this encounter  Medications  . ALPRAZolam (XANAX) 0.5 MG tablet  . morphine (MS CONTIN) 30 MG 12 hr tablet    Sig: Take 1 tablet (30 mg total) by mouth every 12 (twelve) hours.    Dispense:  60 tablet    Refill:  0  . morphine (MSIR) 15 MG tablet    Sig: Take 1 tablet (15 mg total) by mouth every 4 (four) hours as needed for severe pain.    Dispense:  30 tablet    Refill:  0  . Gabapentin Enacarbil ER (HORIZANT) 300 MG TBCR    Sig: Take 1 capsule by mouth daily.    Dispense:  30 tablet    Refill:  0  . amoxicillin-clavulanate (AUGMENTIN) 875-125 MG tablet    Sig: Take 1 tablet by mouth 2 (two) times daily.    Dispense:  28 tablet    Refill:  1  . flunisolide (NASALIDE) 25 MCG/ACT (0.025%) SOLN    Sig: Place 2 sprays into the nose 2 (two) times daily.    Dispense:  1 Bottle    Refill:  11     Follow-up: Return in about 4 weeks (around 09/24/2016).  [name], MD

## 2016-09-03 ENCOUNTER — Other Ambulatory Visit: Payer: Self-pay | Admitting: Internal Medicine

## 2016-09-03 ENCOUNTER — Encounter: Payer: Self-pay | Admitting: *Deleted

## 2016-09-03 ENCOUNTER — Telehealth: Payer: Self-pay | Admitting: Internal Medicine

## 2016-09-03 ENCOUNTER — Telehealth: Payer: Self-pay | Admitting: Emergency Medicine

## 2016-09-03 DIAGNOSIS — G893 Neoplasm related pain (acute) (chronic): Secondary | ICD-10-CM

## 2016-09-03 DIAGNOSIS — G894 Chronic pain syndrome: Secondary | ICD-10-CM

## 2016-09-03 DIAGNOSIS — C3491 Malignant neoplasm of unspecified part of right bronchus or lung: Secondary | ICD-10-CM

## 2016-09-03 DIAGNOSIS — G501 Atypical facial pain: Secondary | ICD-10-CM

## 2016-09-03 MED ORDER — MORPHINE SULFATE 15 MG PO TABS: 15.0000 mg | ORAL_TABLET | ORAL | 0 refills | Status: DC | PRN

## 2016-09-03 NOTE — Progress Notes (Signed)
Oncology Nurse Navigator Documentation  Oncology Nurse Navigator Flowsheets 09/03/2016  Navigator Encounter Type Other/I followed up with [name] regarding scheduling Mr. Duan.  He asked that patient have follow up in the next few weeks.  I notified scheduling.    Treatment Phase Follow-up  Barriers/Navigation Needs Coordination of Care  Interventions Coordination of Care  Coordination of Care Appts  Acuity Level 2  Acuity Level 2 Educational needs  Time Spent with Patient 30

## 2016-09-03 NOTE — Telephone Encounter (Signed)
Patient wife walked in to advise that there were ct scans done @ [facility] back in august. Perhaps we are able to use that information

## 2016-09-03 NOTE — Telephone Encounter (Signed)
His scans have been normal I have sent a referral to neurology to have this headache and facial pain evaluated

## 2016-09-03 NOTE — Telephone Encounter (Signed)
Spoke with pt to confirm 10/24 appt date/timeper LOS

## 2016-09-03 NOTE — Telephone Encounter (Signed)
pts wife called and patients headaches are worse. She stated they did some CT scans done over at the hospital 8/29 and 8/30. She wants to know if you can look at those scans or do you recommend him getting another scan done. Please advise thanks.

## 2016-09-04 ENCOUNTER — Ambulatory Visit: Payer: Commercial Managed Care - HMO | Admitting: Internal Medicine

## 2016-09-04 ENCOUNTER — Telehealth: Payer: Self-pay

## 2016-09-04 NOTE — Telephone Encounter (Signed)
[employer] medical form received and forwarded to MD for review and completion based on last OV and Cancer Diagnosis

## 2016-09-04 NOTE — Telephone Encounter (Signed)
Pt informed of referral

## 2016-09-05 ENCOUNTER — Other Ambulatory Visit: Payer: Self-pay | Admitting: Family Medicine

## 2016-09-09 ENCOUNTER — Other Ambulatory Visit (INDEPENDENT_AMBULATORY_CARE_PROVIDER_SITE_OTHER): Payer: Commercial Managed Care - HMO

## 2016-09-09 ENCOUNTER — Encounter: Payer: Self-pay | Admitting: Neurology

## 2016-09-09 ENCOUNTER — Ambulatory Visit (INDEPENDENT_AMBULATORY_CARE_PROVIDER_SITE_OTHER): Payer: Commercial Managed Care - HMO | Admitting: Neurology

## 2016-09-09 VITALS — BP 140/70 | HR 82 | Ht 66.0 in | Wt 182.5 lb

## 2016-09-09 DIAGNOSIS — H02402 Unspecified ptosis of left eyelid: Secondary | ICD-10-CM

## 2016-09-09 DIAGNOSIS — G4484 Primary exertional headache: Secondary | ICD-10-CM | POA: Diagnosis not present

## 2016-09-09 DIAGNOSIS — G4451 Hemicrania continua: Secondary | ICD-10-CM | POA: Diagnosis not present

## 2016-09-09 DIAGNOSIS — R51 Headache: Secondary | ICD-10-CM

## 2016-09-09 DIAGNOSIS — R519 Headache, unspecified: Secondary | ICD-10-CM

## 2016-09-09 LAB — SEDIMENTATION RATE: Sed Rate: 22 mm/hr — ABNORMAL HIGH (ref 0–20)

## 2016-09-09 LAB — C-REACTIVE PROTEIN: CRP: 2.1 mg/dL (ref 0.5–20.0)

## 2016-09-09 MED ORDER — INDOMETHACIN 25 MG PO CAPS: ORAL_CAPSULE | ORAL | 1 refills | Status: DC

## 2016-09-09 NOTE — Progress Notes (Signed)
[facility] Clinic Note - Initial Visit   Date: 09/09/16  Ernest [name] MRN: [id number] DOB: [birth year]   Dear Dr. Ronnald Ramp:  Thank you for your kind referral of Ernest [name] for consultation of right facial pain. Although his history is well known to you, please allow [place] to reiterate it for the purpose of our medical record. The patient was accompanied to the clinic by self.    History of Present Illness: Ernest [name] is a 60 y.o. 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    Starting around August 2017, he developed throbbing pain over the right side of the face, scalp, and retroorbital.  Pain is dull, achy, and feels as if someone hit him against the head.  He is miserable in pain and reports no benefit even with morphine.  He has no quality to his life and no longer interacts with friends or family.  He states that he feels like it is a sinus infection and is being treated with antibiotics for sinusitis, but has not noticed any improvement.  Pain is constant, occurring every day and lasting all day.  He endorses nausea, no vomiting, photophobia, or phonophobia.  He has noticed increased redness of the eye, rhinitis, and swelling of the eye.  There is no tearing of the eye. Sometimes bending over can trigger the pain. He tried ibuprofen 649m three times daily for the past 2 weeks, which provides some relief.  There was no benefit with gabapentin.  He has 2 L oxygen via nasal canula that he uses at home and feels that it dries his nose and makes the headaches worse.  Out-side paper records, electronic medical record, and images have been reviewed where available and summarized as:  MRI brain wwo contrast 07/23/2016:  1. Punctate focus of diffusion abnormality in the left precentral gyrus  which may reflect a tiny subacute embolic infarct. 2. No enhancing lesions to indicate metastatic disease.  Lab Results  Component Value Date   TSH 2.668 08/06/2016   Lab Results  Component Value Date   HGBA1C 8.1 (H) 04/22/2016   Lab Results  Component Value Date   ESRSEDRATE 4 06/04/2007    PET scan 01/23/2016: 1.9 cm hypermetabolic right lower lobe pulmonary nodule, suspicious for metastasis. Small mediastinal nodal metastases. Radiation changes in the left upper lobe/left lung apex.  Past Medical History:  Diagnosis Date  . Anxiety   . CAD (coronary artery disease)    Left Main 30% stenosis, LAD 20 - 30 % stenosis, first and second diagonal branchesat 40 - 50%  stenosis with small arteries, circumflex had 30% stenosis in the large obtuse marginal, RCA at 70 - 80%  stenosis [not felt to be occlusive after evaluation with flow wire], distal 50 - 60% stenosis - [name][  . Cancer [place]    lung  . Chronic insomnia   . COPD (chronic obstructive pulmonary disease) (HCC)    Dr. CBaird Lyons . [place]  . DDD (degenerative disc disease)   . Depression   . Diabetes mellitus without complication [place] 64/07/8118  no medications now 07/24/2016  . GERD (gastroesophageal reflux disease)   . Gout   . History of cardiovascular stress test    Myoview 7/16:  Diaphragmatic attenuation, no ischemia, EF 56%; Low Risk  . History of radiation  therapy 11/10/13- 12/29/13   left lung 6600 cGy in 33 sessions  . Hx of colonoscopy   . Hyperlipidemia   . Hypertension    dr Percival Spanish  . Itching due to drug 03/19/2016  . Lung cancer ([place]) 10/04/13   LUL squamous cell lung cancer  . Nausea with vomiting 03/26/2016  . OSA (obstructive sleep apnea)    NPSG 09/10/10- AHI 11.3/hr  . PVD (peripheral vascular disease) ([place])    PTA/Stent right common iliac    Past Surgical History:  Procedure Laterality Date  . ANGIOPLASTY    . ANTERIOR FUSION CERVICAL SPINE      cervical fusion  7 yrs ago ([facility])  . arm surgery     left elbow  . BACK SURGERY     lower  . c-spine surgery    . COLON SURGERY     '11 "Diverticulitis"  . COLONOSCOPY WITH PROPOFOL N/A 05/22/2015   Procedure: COLONOSCOPY WITH PROPOFOL;  Surgeon: Inda Castle, MD;  Location: [facility];  Service: Endoscopy;  Laterality: N/A;  . HIP SURGERY     left 'bone graft taken"  . PERIPHERAL VASCULAR CATHETERIZATION N/A 08/10/2015   Procedure: Lower Extremity Angiography;  Surgeon: Lorretta Harp, MD; Distal Ao OK, L-EIA stent OK, R-CIA 100% s/p overlapping 7 mm x 38 mm ICast stents, 50-60% R-CFA      . SHOULDER SURGERY     right  . SPINAL FUSION  03/05/2007   L4-L5  . VIDEO BRONCHOSCOPY WITH ENDOBRONCHIAL NAVIGATION N/A 10/04/2013   Procedure: VIDEO BRONCHOSCOPY WITH ENDOBRONCHIAL NAVIGATION;  Surgeon: Collene Gobble, MD;  Location: [facility];  Service: Thoracic;  Laterality: N/A;     Medications:  Outpatient Encounter Prescriptions as of 09/09/2016  Medication Sig Note  . ADVAIR DISKUS 500-50 MCG/DOSE AEPB 1 PUFF THEN RINSE MOUTH, TWICE DAILY MAINTENANCE   . albuterol (PROVENTIL) (2.5 MG/3ML) 0.083% nebulizer solution INHALE 1 VIAL IN NEBULIZER EVERY 4 HOURS AS NEEDED FOR WHEEZING OR SHORTNESS OF BREATH   . ALPRAZolam (XANAX) 0.5 MG tablet  08/27/2016: Received from: External Pharmacy  . amoxicillin-clavulanate (AUGMENTIN) 875-125 MG tablet Take 1 tablet by mouth 2 (two) times daily.   . ARIPiprazole (ABILIFY) 2 MG tablet Take 2 mg by mouth at bedtime.  06/07/2016: Received from: External Pharmacy  . Armodafinil 250 MG tablet Take 250 mg by mouth every morning.   [name] aspirin 81 MG EC tablet TAKE 1 TABLET BY MOUTH EVERY DAY   . atorvastatin (LIPITOR) 20 MG tablet TAKE 1 TABLET EVERY DAY (Patient taking differently: TAKE 20 MG  EVERY DAY IN THE EVENING)   . clonazePAM (KLONOPIN) 1 MG tablet Take 2 mg by mouth at bedtime as needed for anxiety. Reported on 01/16/2016   . clopidogrel (PLAVIX) 75 MG  tablet Take 1 tablet (75 mg total) by mouth daily.   [name] dextromethorphan-guaiFENesin (MUCINEX DM) 30-600 MG 12hr tablet Take 1 tablet by mouth 2 (two) times daily as needed for cough (congestion).   [name] esomeprazole (NEXIUM) 40 MG capsule TAKE ONE CAPSULE BY MOUTH EVERY DAY (Patient taking differently: TAKE 40 MG  BY MOUTH EVERY DAY)   . feeding supplement, ENSURE ENLIVE, (ENSURE ENLIVE) LIQD Take 237 mLs by mouth 2 (two) times daily between meals.   [name] FLORA-Q (FLORA-Q) CAPS capsule Take 1 capsule by mouth daily. 06/06/2016: Completed this medication, reports took as prescribed  . flunisolide (NASALIDE) 25 MCG/ACT (0.025%) SOLN Place 2 sprays into the nose 2 (two) times daily.   [name] FLUoxetine (  PROZAC) 40 MG capsule Take 40 mg by mouth every morning.   . Gabapentin Enacarbil ER (HORIZANT) 300 MG TBCR Take 1 capsule by mouth daily.   [name] glipiZIDE (GLUCOTROL) 5 MG tablet Take 0.5 tablets (2.5 mg total) by mouth daily before breakfast.   . ibuprofen (ADVIL,MOTRIN) 600 MG tablet Take 1 tablet (600 mg total) by mouth every 8 (eight) hours as needed.   . isosorbide mononitrate (IMDUR) 120 MG 24 hr tablet  08/01/2016: Received from: External Pharmacy  . KLOR-CON M20 20 MEQ tablet TAKE 1 TABLET BY MOUTH EVERY MORNING   . Methylnaltrexone Bromide (RELISTOR) 150 MG TABS Take 3 tablets by mouth every morning.   . metoprolol succinate (TOPROL-XL) 25 MG 24 hr tablet  08/01/2016: Received from: External Pharmacy  . mirtazapine (REMERON) 45 MG tablet Take 1 tablet (45 mg total) by mouth at bedtime.   [name] morphine (MS CONTIN) 30 MG 12 hr tablet Take 1 tablet (30 mg total) by mouth every 12 (twelve) hours.   [name] morphine (MSIR) 15 MG tablet Take 1 tablet (15 mg total) by mouth every 4 (four) hours as needed for severe pain.   . nitroGLYCERIN (NITROSTAT) 0.4 MG SL tablet Place 1 tablet (0.4 mg total) under the tongue every 5 (five) minutes as needed for chest pain. 05/18/2016: Pt has not used recently   . ondansetron (ZOFRAN) 8 MG  tablet TAKE 1 TABLET (8 MG TOTAL) BY MOUTH EVERY 8 (EIGHT) HOURS AS NEEDED FOR NAUSEA OR VOMITING.   . OXYGEN Inhale 2 L into the lungs daily.    . pantoprazole (PROTONIX) 40 MG tablet TAKE 1 TABLET (40 MG TOTAL) BY MOUTH DAILY. 08/01/2016: Received from: External Pharmacy  . predniSONE (DELTASONE) 10 MG tablet Take 4 tabs daily x 2 days, 3 tabs daily x 2 days, 2 tabs daily x 2 days, 1 tab daily x 2 days then stop   . predniSONE (STERAPRED UNI-PAK 21 TAB) 10 MG (21) TBPK tablet Take 1 tablet (10 mg total) by mouth daily. Take 6 tabs by mouth daily  for 2 days, then 5 tabs for 2 days, then 4 tabs for 2 days, then 3 tabs for 2 days, 2 tabs for 2 days, then 1 tab by mouth daily for 2 days   . prochlorperazine (COMPAZINE) 10 MG tablet TAKE 1 TABLET EVERY 6 HOURS AS NEEDED FOR NAUSEA AND VOMITING   . theophylline (UNIPHYL) 400 MG 24 hr tablet Take 1 tablet (400 mg total) by mouth daily.   . Tiotropium Bromide Monohydrate (SPIRIVA RESPIMAT) 1.25 MCG/ACT AERS Inhale 2 puffs into the lungs daily.   . traZODone (DESYREL) 150 MG tablet Take 1 tablet (150 mg total) by mouth at bedtime.   . VENTOLIN HFA 108 (90 Base) MCG/ACT inhaler INHALE 2 PUFFS INTO THE LUNGS 4 TIMES DAILY AS NEEDED FOR WHEEZING   . indomethacin (INDOCIN) 25 MG capsule Take 1 tablet three times daily with meals x 3 days, then increase to 2 tablet three times daily x 3 days.    No facility-administered encounter medications on file as of 09/09/2016.      Allergies:  Allergies  Allergen Reactions  . Carboplatin Shortness Of Breath, Swelling and Rash    Swelling of lips, rash on face,eyes and head    Family History: Family History  Problem Relation Age of Onset  . Heart attack Mother   . Heart attack Sister   . Lung cancer Sister   . Cancer Sister     small  cell lung, mets to brain  . Hypertension Brother   . Emphysema Sister   . Stroke Neg Hx     Social History: Social History  Substance Use Topics  . Smoking status: Former  Smoker    Packs/day: 0.00    Years: 43.00    Types: Cigarettes    Quit date: 09/23/2013  . Smokeless tobacco: Never Used     Comment: history of 3 PPD, 11/02/13   . Alcohol use No   Social History   Social History Narrative   Lives with wife in a one story home.  Has no children.  On disability.  Used to work as a Building control surveyor.  Education: 11th grade.     Review of Systems:  CONSTITUTIONAL: No fevers, chills, night sweats, or weight loss.   EYES: No visual changes or eye pain ENT: No hearing changes.  No history of nose bleeds.   RESPIRATORY: No cough, wheezing and shortness of breath.   CARDIOVASCULAR: Negative for chest pain, and palpitations.   GI: Negative for abdominal discomfort, blood in stools or black stools.  No recent change in bowel habits.   GU:  No history of incontinence.   MUSCLOSKELETAL: +history of joint pain or swelling.  No myalgias.   SKIN: Negative for lesions, rash, and itching.   HEMATOLOGY/ONCOLOGY: Negative for prolonged bleeding, bruising easily, and swollen nodes.  +history of cancer.   ENDOCRINE: Negative for cold or heat intolerance, polydipsia or goiter.   PSYCH:  +depression or anxiety symptoms.   NEURO: As Above.   Vital Signs:  BP 140/70   Pulse 82   Ht 5' 6"  (1.676 m)   Wt 182 lb 8 oz (82.8 kg)   SpO2 92%   BMI 29.46 kg/m     General Medical Exam:   General:  Well appearing, comfortable.   Eyes/ENT: see cranial nerve examination.   Neck: No masses appreciated.  Full range of motion without tenderness.  No carotid bruits. Respiratory:  Clear to auscultation, good air entry bilaterally.   Cardiac:  Regular rate and rhythm, no murmur.   Extremities:  No deformities, edema, or skin discoloration.  Skin:  Erythematous rash over the left forehead.  Neurological Exam: MENTAL STATUS including orientation to time, place, person, recent and remote memory, attention span and concentration, language, and fund of knowledge is normal.  Speech is not  dysarthric.  CRANIAL NERVES: II:  No visual field defects.  Unremarkable fundi.   III-IV-VI: Pupils equal round and reactive to light.  Normal conjugate, extra-ocular eye movements in all directions of gaze.  No nystagmus.  Mild right ptosis.  Bilateral conjunctival injection. V:  Normal facial sensation.     VII:  Normal facial symmetry and movements.    VIII:  Normal hearing and vestibular function.   IX-X:  Normal palatal movement.   XI:  Normal shoulder shrug and head rotation.   XII:  Normal tongue strength and range of motion, no deviation or fasciculation.  MOTOR: Motor strength is 5/5 throughout.  No atrophy, fasciculations or abnormal movements.  No pronator drift.  Tone is normal.    MSRs:  Right  Left brachioradialis 2+  brachioradialis 2+  biceps 2+  biceps 2+  triceps 2+  triceps 2+  patellar 2+  patellar 2+  ankle jerk 2+  ankle jerk 2+  Hoffman no  Hoffman no  plantar response down  plantar response down   SENSORY:  Normal and symmetric perception of light touch, pinprick, vibration, and proprioception.  Romberg's sign absent.   COORDINATION/GAIT: Normal finger-to- nose-finger and heel-to-shin.  Intact rapid alternating movements bilaterally.  Gait narrow based and stable. Tandem and stressed gait intact.    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  Need to keep in mind that inflammatory markers may be nonspecifically elevated in the setting of malignancy  - With him starting Indomethacin, stop all other NSAIDs  Patient to call with update in 10-days  Return to clinic in 4 months   The duration of this appointment visit was 45 minutes of face-to-face time with the patient.  Greater than 50% of this time was spent in counseling, explanation of diagnosis, planning of further management, and coordination of care.   Thank you for allowing me to participate in patient's care.  If I can answer any additional questions, I would be pleased to do so.    Sincerely,    Alexandrya Chim K. PatePosey Pronto

## 2016-09-09 NOTE — Patient Instructions (Addendum)
1. Try using 12L oxygen via face mask for 15-20 minutes to see if this helps.  If this works, call my office and let me know that you responded to oxygen.    2.  If oxygen does not help, start indomethacin '25mg'$  - take 1 tablet three times daily with meals for three days, then increase to 2 tablets three times daily with meals.  Please be sure you are taking Nexium twice daily to protect your stomach  3.  We will also get imaging of your blood vessels to the head and neck  4.  Check inflammatory markers  5.  Call with an update in 10-days  Return to clinic in 4 months

## 2016-09-11 ENCOUNTER — Other Ambulatory Visit: Payer: Self-pay | Admitting: Neurology

## 2016-09-11 ENCOUNTER — Other Ambulatory Visit: Payer: Self-pay | Admitting: Family Medicine

## 2016-09-11 DIAGNOSIS — R51 Headache: Secondary | ICD-10-CM

## 2016-09-11 DIAGNOSIS — R519 Headache, unspecified: Secondary | ICD-10-CM

## 2016-09-11 DIAGNOSIS — H02402 Unspecified ptosis of left eyelid: Secondary | ICD-10-CM

## 2016-09-11 DIAGNOSIS — G4484 Primary exertional headache: Secondary | ICD-10-CM

## 2016-09-11 DIAGNOSIS — G4451 Hemicrania continua: Secondary | ICD-10-CM

## 2016-09-11 DIAGNOSIS — I6523 Occlusion and stenosis of bilateral carotid arteries: Secondary | ICD-10-CM

## 2016-09-12 ENCOUNTER — Emergency Department (HOSPITAL_COMMUNITY)
Admission: EM | Admit: 2016-09-12 | Discharge: 2016-09-12 | Disposition: A | Discharge status: Home / Self Care | Payer: Commercial Managed Care - HMO | Attending: Emergency Medicine | Admitting: Emergency Medicine

## 2016-09-12 ENCOUNTER — Encounter (HOSPITAL_COMMUNITY): Payer: Self-pay | Admitting: Emergency Medicine

## 2016-09-12 ENCOUNTER — Emergency Department (HOSPITAL_COMMUNITY): Payer: Commercial Managed Care - HMO

## 2016-09-12 DIAGNOSIS — Z7982 Long term (current) use of aspirin: Secondary | ICD-10-CM | POA: Diagnosis not present

## 2016-09-12 DIAGNOSIS — Z87891 Personal history of nicotine dependence: Secondary | ICD-10-CM | POA: Insufficient documentation

## 2016-09-12 DIAGNOSIS — J449 Chronic obstructive pulmonary disease, unspecified: Secondary | ICD-10-CM | POA: Diagnosis not present

## 2016-09-12 DIAGNOSIS — C3490 Malignant neoplasm of unspecified part of unspecified bronchus or lung: Secondary | ICD-10-CM | POA: Insufficient documentation

## 2016-09-12 DIAGNOSIS — C801 Malignant (primary) neoplasm, unspecified: Secondary | ICD-10-CM | POA: Diagnosis not present

## 2016-09-12 DIAGNOSIS — C7800 Secondary malignant neoplasm of unspecified lung: Secondary | ICD-10-CM | POA: Diagnosis not present

## 2016-09-12 DIAGNOSIS — I10 Essential (primary) hypertension: Secondary | ICD-10-CM | POA: Diagnosis not present

## 2016-09-12 DIAGNOSIS — R079 Chest pain, unspecified: Secondary | ICD-10-CM | POA: Diagnosis not present

## 2016-09-12 DIAGNOSIS — Z79899 Other long term (current) drug therapy: Secondary | ICD-10-CM | POA: Insufficient documentation

## 2016-09-12 DIAGNOSIS — I2510 Atherosclerotic heart disease of native coronary artery without angina pectoris: Secondary | ICD-10-CM | POA: Diagnosis not present

## 2016-09-12 DIAGNOSIS — E119 Type 2 diabetes mellitus without complications: Secondary | ICD-10-CM | POA: Insufficient documentation

## 2016-09-12 DIAGNOSIS — G8929 Other chronic pain: Secondary | ICD-10-CM

## 2016-09-12 DIAGNOSIS — R0602 Shortness of breath: Secondary | ICD-10-CM | POA: Diagnosis not present

## 2016-09-12 DIAGNOSIS — R51 Headache: Secondary | ICD-10-CM | POA: Diagnosis not present

## 2016-09-12 LAB — CBC
HCT: 37.3 % — ABNORMAL LOW (ref 39.0–52.0)
Hemoglobin: 12.3 g/dL — ABNORMAL LOW (ref 13.0–17.0)
MCH: 28.4 pg (ref 26.0–34.0)
MCHC: 33 g/dL (ref 30.0–36.0)
MCV: 86.1 fL (ref 78.0–100.0)
PLATELETS: 143 10*3/uL — AB (ref 150–400)
RBC: 4.33 MIL/uL (ref 4.22–5.81)
RDW: 16.1 % — ABNORMAL HIGH (ref 11.5–15.5)
WBC: 12.3 10*3/uL — AB (ref 4.0–10.5)

## 2016-09-12 LAB — BASIC METABOLIC PANEL
Anion gap: 7 (ref 5–15)
BUN: 16 mg/dL (ref 6–20)
CALCIUM: 9.2 mg/dL (ref 8.9–10.3)
CHLORIDE: 96 mmol/L — AB (ref 101–111)
CO2: 27 mmol/L (ref 22–32)
CREATININE: 0.75 mg/dL (ref 0.61–1.24)
GFR calc non Af Amer: >60 mL/min (ref >60)
Glucose, Bld: 131 mg/dL — ABNORMAL HIGH (ref 65–99)
Potassium: 4.5 mmol/L (ref 3.5–5.1)
SODIUM: 130 mmol/L — AB (ref 135–145)

## 2016-09-12 LAB — I-STAT TROPONIN, ED: TROPONIN I, POC: 0 ng/mL (ref 0.00–0.08)

## 2016-09-12 MED ORDER — SODIUM CHLORIDE 0.9 % IV BOLUS (SEPSIS)
500.0000 mL | Freq: Once | INTRAVENOUS | Status: AC
  Administered 2016-09-12: 500 mL via INTRAVENOUS

## 2016-09-12 MED ORDER — SODIUM CHLORIDE 0.9 % IV SOLN
INTRAVENOUS | Status: DC
  Administered 2016-09-12: 20:00:00 via INTRAVENOUS

## 2016-09-12 MED ORDER — HYDROMORPHONE HCL 4 MG PO TABS: 4.0000 mg | ORAL_TABLET | ORAL | 0 refills | Status: DC | PRN

## 2016-09-12 MED ORDER — SODIUM CHLORIDE 0.9 % IV BOLUS (SEPSIS): 250.0000 mL | Freq: Once | INTRAVENOUS | Status: DC

## 2016-09-12 MED ORDER — SODIUM CHLORIDE 0.9 % IV SOLN: INTRAVENOUS | Status: DC

## 2016-09-12 MED ORDER — ONDANSETRON HCL 4 MG/2ML IJ SOLN
4.0000 mg | Freq: Once | INTRAMUSCULAR | Status: AC
  Administered 2016-09-12: 4 mg via INTRAVENOUS
  Filled 2016-09-12: qty 2

## 2016-09-12 MED ORDER — HYDROMORPHONE HCL 1 MG/ML IJ SOLN
1.0000 mg | Freq: Once | INTRAMUSCULAR | Status: AC
  Administered 2016-09-12: 1 mg via INTRAVENOUS
  Filled 2016-09-12: qty 1

## 2016-09-12 NOTE — Discharge Instructions (Signed)
Return for any new or worse symptoms. Supplement the hydromorphone orally with your other pain medicines. Follow-up with hematology oncology as scheduled. Make an appointment to follow-up with your regular doctor as needed. Keep your appointments with neurology.   Today's head CT without any significant findings. Chest x-ray stable.

## 2016-09-12 NOTE — ED Notes (Signed)
Pt in X-Ray ?

## 2016-09-12 NOTE — ED Provider Notes (Signed)
[facility] DEPT Provider Note   CSN: [id number] Arrival date & time: 09/12/16  1828     History   Chief Complaint Chief Complaint  Patient presents with  . Headache  . Chest Pain    HPI Ernest [name] is a 60 y.o. male.   Patient with a history of metastatic lung cancer. Presents today with continued and persistent right-sided headache that involves the right eye. And right face. This is been a long-standing complaint. Recently seen by his primary care doctor for this. Referred to neurology. Neurology is ordered outpatient head CT and CT angiogram head. Asians states headache is worse today although similar to the chronic headache. Patient also did have an MRI on August 29 without any significant findings other than a tiny subacute infarct in the left precentral gyrus.Patient states that his pain medicine he takes at home is just not helping enough.In addition last evening patient had chest pain for 2 hours substernal went away with nitroglycerin no recurrent any pain. Patient followed by [facility] neurology.      Past Medical History:  Diagnosis Date  . Anxiety   . CAD (coronary artery disease)    Left Main 30% stenosis, LAD 20 - 30 % stenosis, first and second diagonal branchesat 40 - 50%  stenosis with small arteries, circumflex had 30% stenosis in the large obtuse marginal, RCA at 70 - 80%  stenosis [not felt to be occlusive after evaluation with flow wire], distal 50 - 60% stenosis - [name][  . Cancer ([place])    lung  . Chronic insomnia   . COPD (chronic obstructive pulmonary disease) (HCC)    Dr. Baird Lyons  . 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 DDD (degenerative disc disease)   . Depression   . Diabetes mellitus without complication ([place]) 6/78/9381   no medications now 07/24/2016  . GERD (gastroesophageal reflux disease)   . Gout   . History of cardiovascular stress test    Myoview 7/16:  Diaphragmatic attenuation, no ischemia, EF 56%; Low Risk  .  History of radiation therapy 11/10/13- 12/29/13   left lung 6600 cGy in 33 sessions  . Hx of colonoscopy   . Hyperlipidemia   . Hypertension    dr Percival Spanish  . Itching due to drug 03/19/2016  . Lung cancer ([place]) 10/04/13   LUL squamous cell lung cancer  . Nausea with vomiting 03/26/2016  . OSA (obstructive sleep apnea)    NPSG 09/10/10- AHI 11.3/hr  . PVD (peripheral vascular disease) ([name])    PTA/Stent right common iliac    Patient Active Problem List   Diagnosis Date Noted  . Cancer associated pain 08/27/2016  . Acute recurrent maxillary sinusitis 08/27/2016  . Acute seasonal allergic rhinitis due to pollen 08/27/2016  . Insomnia due to anxiety and fear 08/01/2016  . Therapeutic opioid-induced constipation (OIC) 07/18/2016  . Atypical facial pain 07/18/2016  . Encounter for antineoplastic chemotherapy 02/20/2016  . 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 Rhinitis, nonallergic, chronic 09/22/2015  . 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 Claudication ([place]) 08/10/2015  . Coronary artery disease involving native coronary artery of native heart without angina pectoris 05/17/2015  . Diabetes mellitus without complication ([place]) 01/75/1025  . Chronic respiratory failure with hypercapnia ([place])   . Chronic pain syndrome   . Chronic respiratory failure with hypoxia ([place]) 12/15/2014  . Non-small cell carcinoma of lung, right ([place]) 10/07/2013  . Preventative health care 08/01/2011  . Obstructive sleep apnea 09/24/2010  . Peripheral vascular  disease ([place]) 05/15/2010  . Hyperlipidemia with target LDL less than 70 05/16/2009  . GOUT 09/08/2007  . Depression with anxiety 09/08/2007  . Essential hypertension 09/08/2007  . COPD mixed type ([place]) 09/08/2007  . GERD 09/08/2007    Past Surgical History:  Procedure Laterality Date  . ANGIOPLASTY    . ANTERIOR FUSION CERVICAL SPINE     cervical fusion  7 yrs ago ([facility])  . arm surgery     left elbow  . BACK SURGERY     lower  . c-spine  surgery    . COLON SURGERY     '11 "Diverticulitis"  . COLONOSCOPY WITH PROPOFOL N/A 05/22/2015   Procedure: COLONOSCOPY WITH PROPOFOL;  Surgeon: Inda Castle, MD;  Location: [facility];  Service: Endoscopy;  Laterality: N/A;  . HIP SURGERY     left 'bone graft taken"  . PERIPHERAL VASCULAR CATHETERIZATION N/A 08/10/2015   Procedure: Lower Extremity Angiography;  Surgeon: Lorretta Harp, MD; Distal Ao OK, L-EIA stent OK, R-CIA 100% s/p overlapping 7 mm x 38 mm ICast stents, 50-60% R-CFA      . SHOULDER SURGERY     right  . SPINAL FUSION  03/05/2007   L4-L5  . VIDEO BRONCHOSCOPY WITH ENDOBRONCHIAL NAVIGATION N/A 10/04/2013   Procedure: VIDEO BRONCHOSCOPY WITH ENDOBRONCHIAL NAVIGATION;  Surgeon: Collene Gobble, MD;  Location: [facility];  Service: Thoracic;  Laterality: N/A;       Home Medications    Prior to Admission medications   Medication Sig Start Date End Date Taking? Authorizing Provider  ADVAIR DISKUS 500-50 MCG/DOSE AEPB 1 PUFF THEN RINSE MOUTH, TWICE DAILY MAINTENANCE 04/26/16  Yes Deneise Lever, MD  albuterol (PROVENTIL) (2.5 MG/3ML) 0.083% nebulizer solution INHALE 1 VIAL IN NEBULIZER EVERY 4 HOURS AS NEEDED FOR WHEEZING OR SHORTNESS OF BREATH 09/03/16  Yes Deneise Lever, MD  ARIPiprazole (ABILIFY) 2 MG tablet Take 2 mg by mouth at bedtime.  05/22/16  Yes Historical Provider, MD  Armodafinil 250 MG tablet Take 250 mg by mouth every morning. 03/08/16  Yes Historical Provider, MD  aspirin 81 MG EC tablet TAKE 1 TABLET BY MOUTH EVERY DAY 07/07/16  Yes Janith Lima, MD  atorvastatin (LIPITOR) 20 MG tablet TAKE 1 TABLET EVERY DAY Patient taking differently: TAKE 20 MG  EVERY DAY IN THE EVENING 08/01/15  Yes Dorena Cookey, MD  clonazePAM (KLONOPIN) 1 MG tablet Take 2 mg by mouth at bedtime as needed for anxiety. Reported on 01/16/2016   Yes Historical Provider, MD  dextromethorphan-guaiFENesin [facility] DM) 30-600 MG 12hr tablet Take 1 tablet by mouth 2 (two) times daily as needed  for cough (congestion).   Yes Historical Provider, MD  esomeprazole (NEXIUM) 40 MG capsule TAKE ONE CAPSULE BY MOUTH EVERY DAY Patient taking differently: TAKE 40 MG  BY MOUTH TWICE DAILY. 05/06/16  Yes Janith Lima, MD  feeding supplement, ENSURE ENLIVE, (ENSURE ENLIVE) LIQD Take 237 mLs by mouth 2 (two) times daily between meals. 05/23/16  Yes Nishant Dhungel, MD  FLORA-Q (FLORA-Q) CAPS capsule Take 1 capsule by mouth daily. 04/25/16  Yes Silver Huguenin Elgergawy, MD  FLUoxetine (PROZAC) 40 MG capsule Take 40 mg by mouth every morning.   Yes Historical Provider, MD  glipiZIDE (GLUCOTROL) 5 MG tablet Take 0.5 tablets (2.5 mg total) by mouth daily before breakfast. 05/07/16  Yes Janith Lima, MD  indomethacin (INDOCIN) 25 MG capsule Take 1 tablet three times daily with meals x 3 days, then increase to 2  tablet three times daily x 3 days. 09/09/16  Yes Donika K Patel, DO  KLOR-CON M20 20 MEQ tablet TAKE 1 TABLET BY MOUTH EVERY MORNING 07/22/16  Yes Janith Lima, MD  mirtazapine (REMERON) 45 MG tablet Take 1 tablet (45 mg total) by mouth at bedtime. 12/29/14  Yes Tammy S Parrett, NP  morphine (MS CONTIN) 30 MG 12 hr tablet Take 1 tablet (30 mg total) by mouth every 12 (twelve) hours. 08/27/16  Yes Janith Lima, MD  nitroGLYCERIN (NITROSTAT) 0.4 MG SL tablet Place 1 tablet (0.4 mg total) under the tongue every 5 (five) minutes as needed for chest pain. 08/09/14  Yes Minus Breeding, MD  ondansetron (ZOFRAN) 8 MG tablet TAKE 1 TABLET (8 MG TOTAL) BY MOUTH EVERY 8 (EIGHT) HOURS AS NEEDED FOR NAUSEA OR VOMITING. 06/20/16  Yes Curt Bears, MD  OXYGEN Inhale 2 L into the lungs daily.    Yes Historical Provider, MD  predniSONE (DELTASONE) 5 MG tablet Take 5 mg by mouth daily with breakfast.   Yes Historical Provider, MD  prochlorperazine (COMPAZINE) 10 MG tablet TAKE 1 TABLET EVERY 6 HOURS AS NEEDED FOR NAUSEA AND VOMITING 07/19/16  Yes Curt Bears, MD  theophylline (UNIPHYL) 400 MG 24 hr tablet Take 1 tablet  (400 mg total) by mouth daily. 08/12/16  Yes Deneise Lever, MD  Tiotropium Bromide Monohydrate (SPIRIVA RESPIMAT) 1.25 MCG/ACT AERS Inhale 2 puffs into the lungs daily. 04/16/16  Yes Janith Lima, MD  traZODone (DESYREL) 150 MG tablet Take 1 tablet (150 mg total) by mouth at bedtime. 08/01/16  Yes Janith Lima, MD  VENTOLIN HFA 108 (90 Base) MCG/ACT inhaler INHALE 2 PUFFS INTO THE LUNGS 4 TIMES DAILY AS NEEDED FOR WHEEZING 06/03/16  Yes Deneise Lever, MD  clopidogrel (PLAVIX) 75 MG tablet Take 1 tablet (75 mg total) by mouth daily. Patient not taking: Reported on 09/12/2016 08/11/15   Almyra Deforest, PA  flunisolide (NASALIDE) 25 MCG/ACT (0.025%) SOLN Place 2 sprays into the nose 2 (two) times daily. Patient not taking: Reported on 09/12/2016 08/27/16   Janith Lima, MD  Gabapentin Enacarbil ER (HORIZANT) 300 MG TBCR Take 1 capsule by mouth daily. Patient not taking: Reported on 09/12/2016 08/27/16   Janith Lima, MD  HYDROmorphone (DILAUDID) 4 MG tablet Take 1 tablet (4 mg total) by mouth every 4 (four) hours as needed for severe pain. 09/12/16   Fredia Sorrow, MD  ibuprofen (ADVIL,MOTRIN) 600 MG tablet Take 1 tablet (600 mg total) by mouth every 8 (eight) hours as needed. Patient not taking: Reported on 09/12/2016 08/01/16   Janith Lima, MD  Methylnaltrexone Bromide (RELISTOR) 150 MG TABS Take 3 tablets by mouth every morning. Patient not taking: Reported on 09/12/2016 07/18/16   Janith Lima, MD  morphine (MSIR) 15 MG tablet Take 1 tablet (15 mg total) by mouth every 4 (four) hours as needed for severe pain. Patient not taking: Reported on 09/12/2016 09/03/16   Janith Lima, MD  predniSONE (DELTASONE) 10 MG tablet Take 4 tabs daily x 2 days, 3 tabs daily x 2 days, 2 tabs daily x 2 days, 1 tab daily x 2 days then stop Patient not taking: Reported on 09/12/2016 08/16/16   Deneise Lever, MD  predniSONE (STERAPRED UNI-PAK 21 TAB) 10 MG (21) TBPK tablet Take 1 tablet (10 mg total) by mouth  daily. Take 6 tabs by mouth daily  for 2 days, then 5 tabs for 2 days, then 4 tabs  for 2 days, then 3 tabs for 2 days, 2 tabs for 2 days, then 1 tab by mouth daily for 2 days Patient not taking: Reported on 09/12/2016 08/13/16   Isla Pence, MD    Family History Family History  Problem Relation Age of Onset  . Heart attack Mother   . Heart attack Sister   . Lung cancer Sister   . Cancer Sister     small cell lung, mets to brain  . Hypertension Brother   . Emphysema Sister   . Stroke Neg Hx     Social History Social History  Substance Use Topics  . Smoking status: Former Smoker    Packs/day: 0.00    Years: 43.00    Types: Cigarettes    Quit date: 09/23/2013  . Smokeless tobacco: Never Used     Comment: history of 3 PPD, 11/02/13   . Alcohol use No     Allergies   Carboplatin   Review of Systems Review of Systems  Constitutional: Negative for fever.  HENT: Positive for congestion.   Eyes: Negative for visual disturbance.  Respiratory: Positive for cough. Negative for shortness of breath.   Cardiovascular: Positive for chest pain.  Gastrointestinal: Negative for abdominal pain, nausea and vomiting.  Genitourinary: Negative for dysuria.  Musculoskeletal: Negative for back pain.  Skin: Negative for rash.  Allergic/Immunologic: Positive for immunocompromised state.  Neurological: Positive for headaches.  Hematological: Does not bruise/bleed easily.  Psychiatric/Behavioral: Negative for confusion.     Physical Exam Updated Vital Signs BP 109/60   Pulse 61   Temp 98.7 F (37.1 C) (Oral)   Resp 16   Wt 83 kg   SpO2 97%   BMI 29.54 kg/m   Physical Exam  Constitutional: He is oriented to person, place, and time. He appears well-developed and well-nourished. No distress.  HENT:  Head: Normocephalic and atraumatic.  Mouth/Throat: Oropharynx is clear and moist.  Eyes: EOM are normal. Pupils are equal, round, and reactive to light.  Neck: Normal range of  motion. Neck supple.  Cardiovascular: Normal rate, regular rhythm and normal heart sounds.   No murmur heard. Pulmonary/Chest: Effort normal and breath sounds normal. No respiratory distress. He exhibits no tenderness.  Abdominal: Soft. Bowel sounds are normal. There is no tenderness.  Musculoskeletal: Normal range of motion.  Neurological: He is alert and oriented to person, place, and time. No cranial nerve deficit. He exhibits normal muscle tone. Coordination normal.  Skin: Skin is warm.  Nursing note and vitals reviewed.    ED Treatments / Results  Labs (all labs ordered are listed, but only abnormal results are displayed) Labs Reviewed  BASIC METABOLIC PANEL - Abnormal; Notable for the following:       Result Value   Sodium 130 (*)    Chloride 96 (*)    Glucose, Bld 131 (*)    All other components within normal limits  CBC - Abnormal; Notable for the following:    WBC 12.3 (*)    Hemoglobin 12.3 (*)    HCT 37.3 (*)    RDW 16.1 (*)    Platelets 143 (*)    All other components within normal limits  I-STAT TROPOININ, ED    EKG  EKG Interpretation  Date/Time:  Thursday September 12 2016 18:54:40 EDT Ventricular Rate:  69 PR Interval:    QRS Duration: 86 QT Interval:  377 QTC Calculation: 404 R Axis:   89 Text Interpretation:  Sinus rhythm Atrial premature complexes Borderline right axis  deviation RSR' in V1 or V2, probably normal variant Nonspecific T abnrm, anterolateral leads ST elevation, consider inferior injury Baseline wander in lead(s) V3 Confirmed by Collyns Mcquigg  MD, Cynia Abruzzo [phone]) on 09/12/2016 8:13:55 PM       Radiology Dg Chest 2 View  Result Date: 09/12/2016 CLINICAL DATA:  Mid chest pain and shortness of breath beginning last night. History of lung cancer, COPD, diabetes and hypertension. EXAM: CHEST  2 VIEW COMPARISON:  Chest radiograph August 13, 2016 and CT chest July 24, 2016. FINDINGS: Stable post treatment changes of LEFT lung apex. Multiple  masses project along the RIGHT mediastinal margin, unchanged. No pleural effusion. Similar RIGHT lung base scarring. No focal consolidation. Cardiac silhouette is normal in size. No pneumothorax. ACDF. Chronic deformity RIGHT distal clavicle. IMPRESSION: No acute cardiopulmonary process. Relatively stable appearance of RIGHT lung/ mediastinal masses and posttreatment changes LEFT lung apex. Electronically Signed   By: Elon Alas M.D.   On: 09/12/2016 20:21   Ct Head Wo Contrast  Result Date: 09/12/2016 CLINICAL DATA:  Right-sided headache beginning earlier today. Patient with lung cancer EXAM: CT HEAD WITHOUT CONTRAST TECHNIQUE: Contiguous axial images were obtained from the base of the skull through the vertex without intravenous contrast. COMPARISON:  MRI 07/23/2016 FINDINGS: Brain: No evidence of malformation, atrophy, old or acute small or large vessel infarction, mass lesion, hemorrhage, hydrocephalus or extra-axial collection. No evidence of pituitary lesion. Vascular: There is atherosclerotic calcification of the major vessels at the base of the brain. Skull: Normal.  No fracture or focal bone lesion. Sinuses/Orbits: Visualized sinuses are clear. No fluid in the middle ears or mastoids. Visualized orbits are normal. Other: None significant IMPRESSION: Normal head CT. No abnormality seen in the region of the left precentral gyrus by CT. Tiny focus of restricted diffusion was seen in that location on MRI. Electronically Signed   By: Nelson Chimes M.D.   On: 09/12/2016 21:18    Procedures Procedures (including critical care time)  Medications Ordered in ED Medications  0.9 %  sodium chloride infusion ( Intravenous New Bag/Given 09/12/16 2021)  ondansetron (ZOFRAN) injection 4 mg (4 mg Intravenous Given 09/12/16 2021)  HYDROmorphone (DILAUDID) injection 1 mg (1 mg Intravenous Given 09/12/16 2021)  sodium chloride 0.9 % bolus 500 mL (0 mLs Intravenous Stopped 09/12/16 2056)     Initial  Impression / Assessment and Plan / ED Course  I have reviewed the triage vital signs and the nursing notes.  Pertinent labs & imaging results that were available during my care of the patient were reviewed by me and considered in my medical decision making (see chart for details).  Clinical Course    Patient with history of lung cancer. Which based on today's chest x-ray stable. Patient's main complaint here today was twofold yesterday he had chest pain for 2 hours. Troponin is negative no evidence of an acute cardiac event. But main complaint is persistent headache that he's had on the right side of his face and head for several months now.  Patient states worst today. Head CT done tonight shows no significant changes. Patient had an outpatient head CT scheduled for October 25. This is no longer required. He'll get back with neurology and have it canceled.Patient was significant improvement here with hydromorphone 1 mg IV. Patient feels much better. Headaches not completely resolved and improved. We'll supplement his normal pain medicine with hydromorphone orally. Have him follow-up with his doctors.  Final Clinical Impressions(s) / ED Diagnoses   Final diagnoses:  Chronic nonintractable headache, unspecified headache type  Malignant neoplasm of lung, unspecified laterality, unspecified part of lung (HCC)    New Prescriptions New Prescriptions   HYDROMORPHONE (DILAUDID) 4 MG TABLET    Take 1 tablet (4 mg total) by mouth every 4 (four) hours as needed for severe pain.     Fredia Sorrow, MD 09/12/16 2236

## 2016-09-12 NOTE — ED Triage Notes (Signed)
Pt reports he has had a R side HA since this am that radiates all the way around his head to his neck. Pt has lung ca, but not on chemo/radiation yet. Pt on 2L [place] at home. Pt then adds that he has been having intermittent CP at home that was relieved with nitro this am. No CP at present.

## 2016-09-13 ENCOUNTER — Telehealth: Payer: Self-pay | Admitting: Internal Medicine

## 2016-09-13 ENCOUNTER — Telehealth: Payer: Self-pay | Admitting: *Deleted

## 2016-09-13 DIAGNOSIS — G501 Atypical facial pain: Secondary | ICD-10-CM

## 2016-09-13 MED ORDER — GABAPENTIN ENACARBIL ER 300 MG PO TBCR: 1.0000 | EXTENDED_RELEASE_TABLET | Freq: Every day | ORAL | 2 refills | Status: DC

## 2016-09-13 MED ORDER — PREDNISONE 5 MG PO TABS: 5.0000 mg | ORAL_TABLET | Freq: Every day | ORAL | 1 refills | Status: DC

## 2016-09-13 NOTE — Telephone Encounter (Signed)
Called and spoke with pt and he is needing a refill of the prednisone 5 mg  1 daily sent to his pharmacy.  This has been done and nothing further is needed.  Last ov was 06/18/16.

## 2016-09-13 NOTE — Telephone Encounter (Signed)
Pt left msg on triage stating MD gave samples of Gabapentin 300 mg samples. Requesting rx to be called into pharmacy. Inform will send to [facility].../lmb

## 2016-09-14 IMAGING — CR DG CHEST 2V
2 series · 2 of 2 positions shown · non-contrast
Comparison: 11/15/2014

CLINICAL DATA: Cough, congestion, low-grade fever, history of left
lung cancer

EXAM:
CHEST  2 VIEW

[view not recorded (1 of 2)]
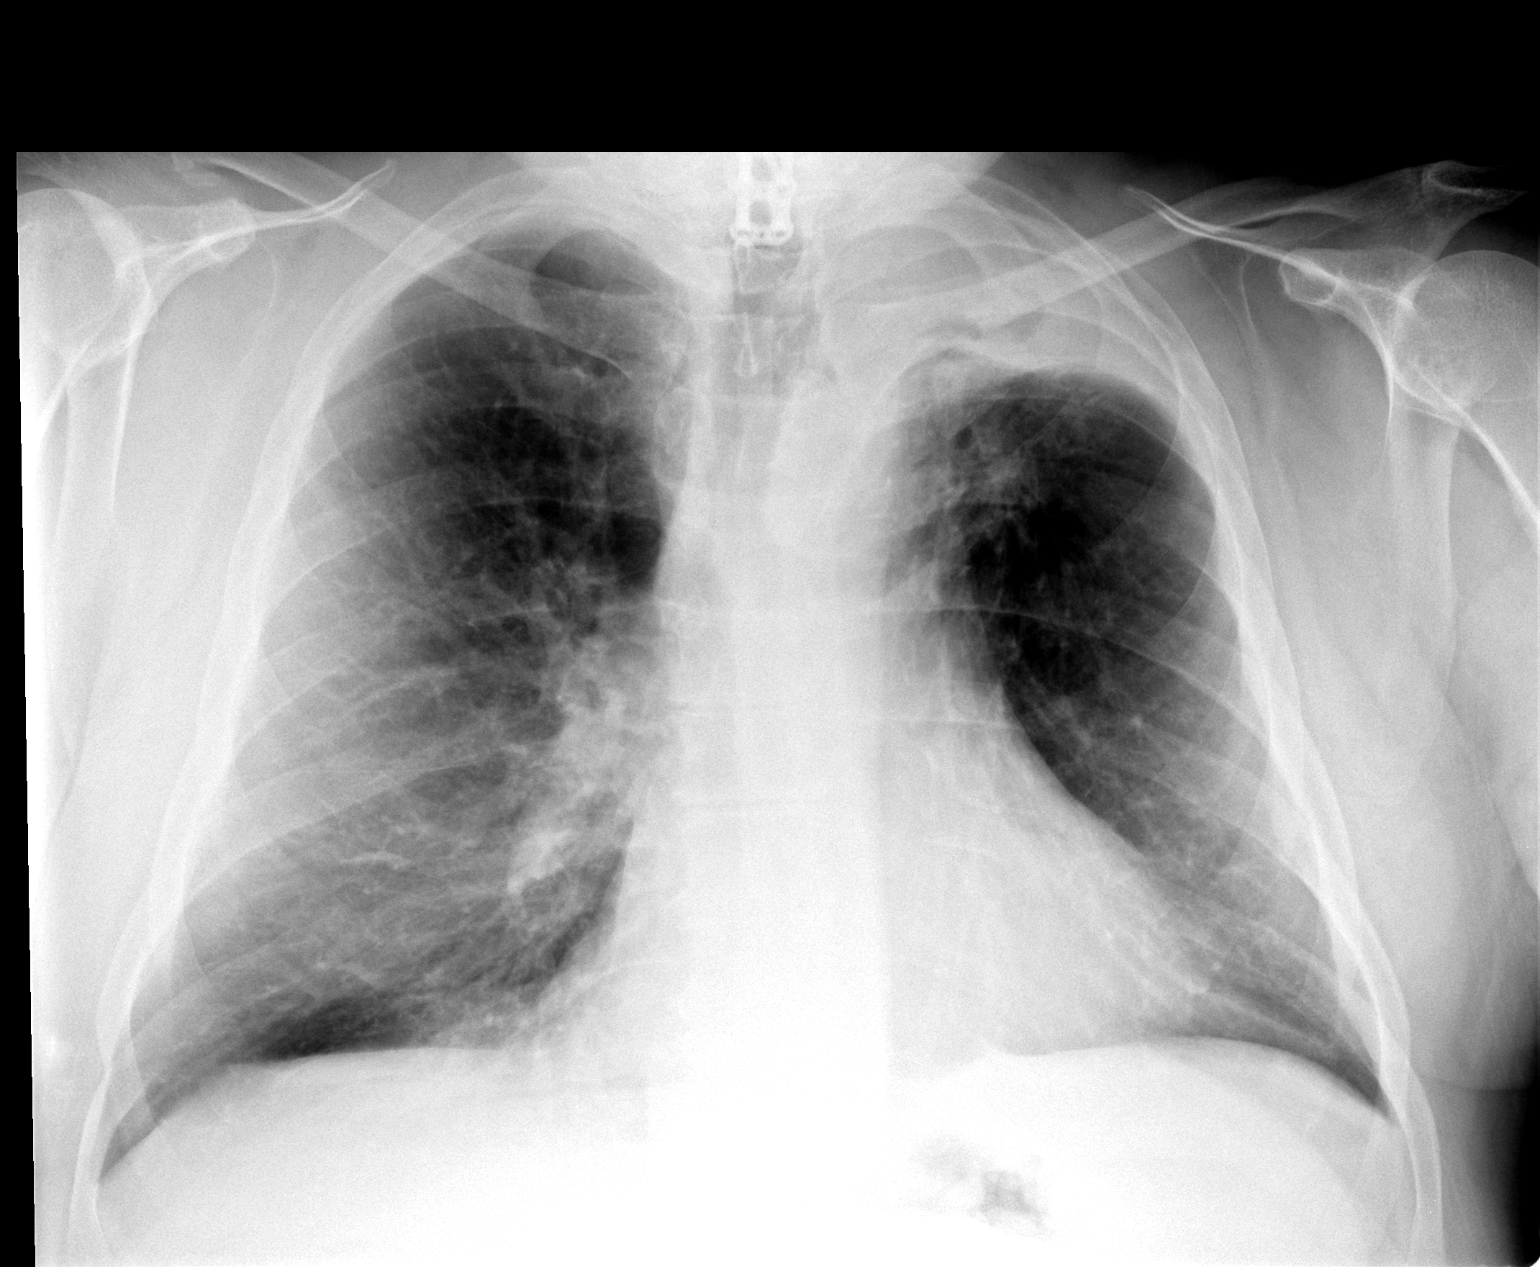

[view not recorded (2 of 2)]
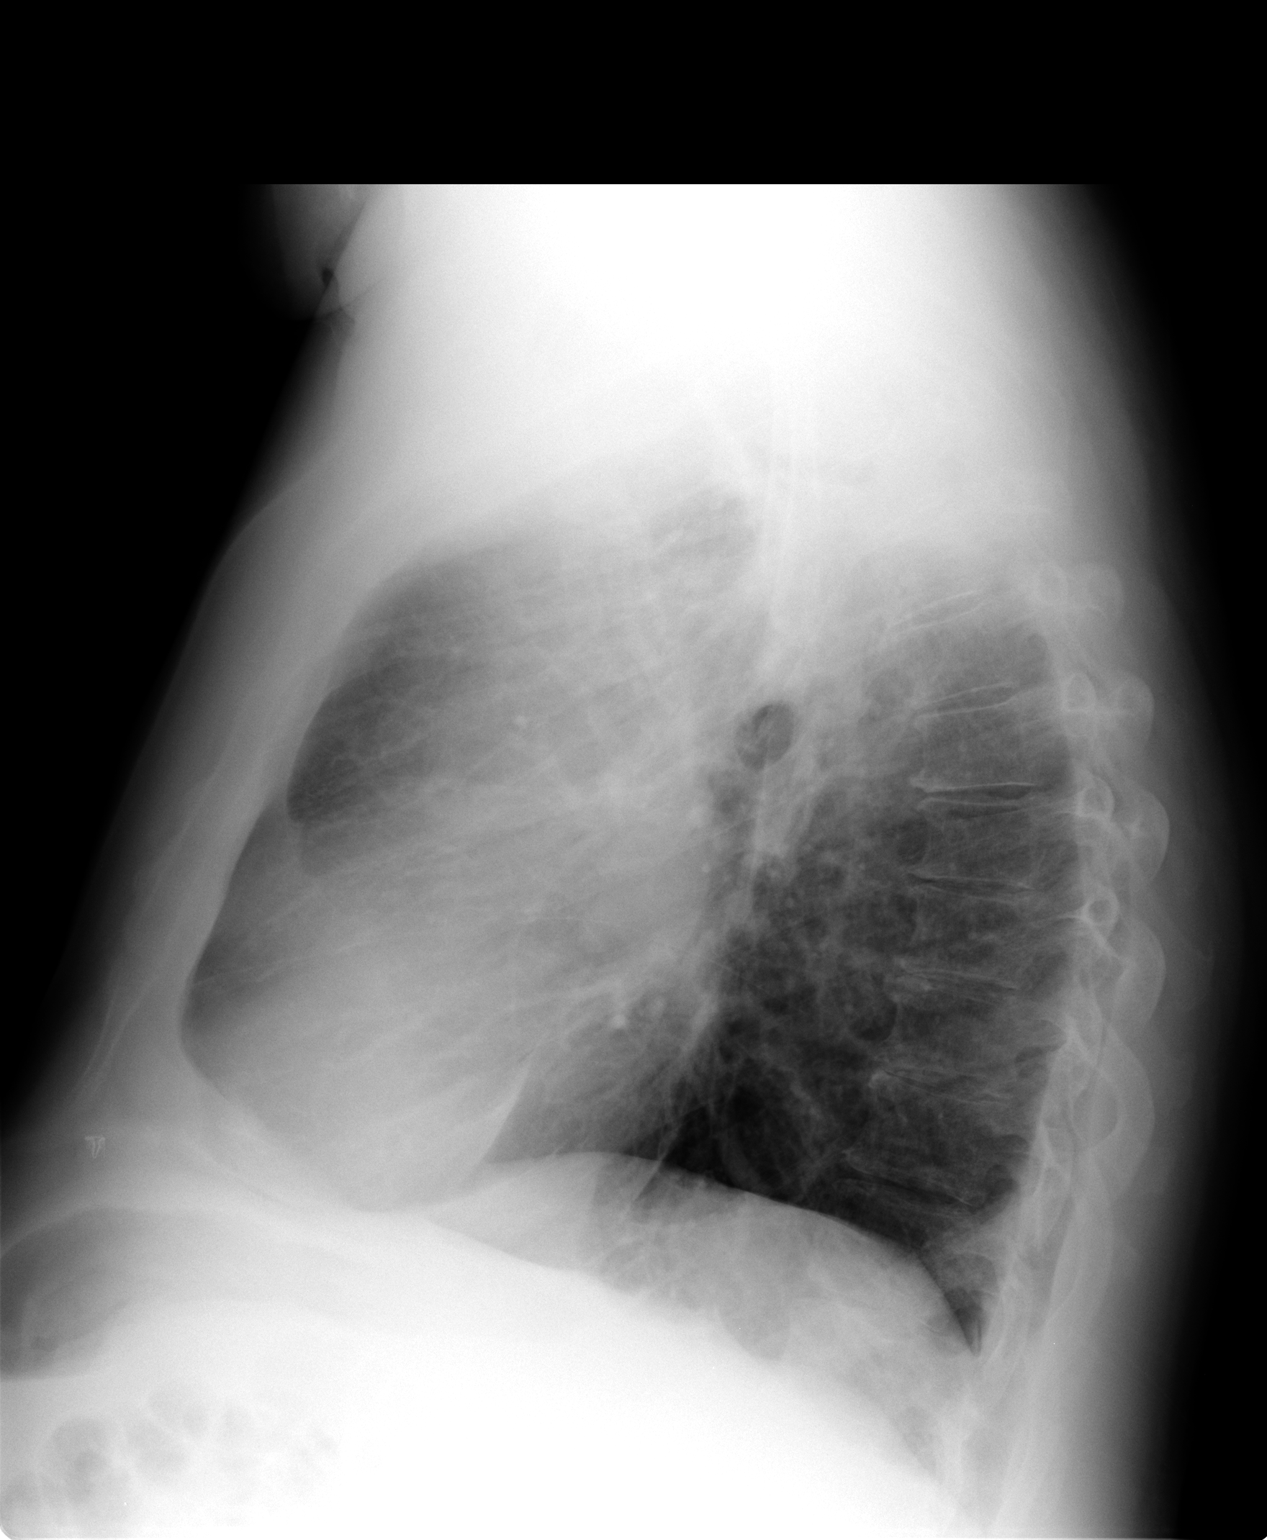

[2 of 2 positions shown; findings below may reference images not displayed]

FINDINGS: Cardiomediastinal silhouette is stable. Stable consolidation and
postradiation changes in left upper lobe. No definite superimposed
infiltrate or pulmonary edema. Mild degenerative changes thoracic
spine. Volume loss in left upper lobe again noted.
IMPRESSION: Stable chronic consolidation and postradiation changes in left upper
lobe. No definite superimposed infiltrate or pulmonary edema.

## 2016-09-17 ENCOUNTER — Ambulatory Visit (HOSPITAL_COMMUNITY)
Admission: RE | Admit: 2016-09-17 | Discharge: 2016-09-17 | Disposition: A | Discharge status: Home / Self Care | Payer: Commercial Managed Care - HMO | Source: Ambulatory Visit | Attending: Neurology | Admitting: Neurology

## 2016-09-17 ENCOUNTER — Telehealth: Payer: Self-pay | Admitting: Internal Medicine

## 2016-09-17 ENCOUNTER — Other Ambulatory Visit (HOSPITAL_BASED_OUTPATIENT_CLINIC_OR_DEPARTMENT_OTHER): Payer: Commercial Managed Care - HMO

## 2016-09-17 ENCOUNTER — Encounter: Payer: Self-pay | Admitting: Internal Medicine

## 2016-09-17 ENCOUNTER — Ambulatory Visit (HOSPITAL_BASED_OUTPATIENT_CLINIC_OR_DEPARTMENT_OTHER): Payer: Commercial Managed Care - HMO | Admitting: Internal Medicine

## 2016-09-17 VITALS — BP 123/58 | HR 88 | Temp 97.9°F | Resp 17 | Ht 66.0 in | Wt 185.6 lb

## 2016-09-17 DIAGNOSIS — H02402 Unspecified ptosis of left eyelid: Secondary | ICD-10-CM | POA: Diagnosis not present

## 2016-09-17 DIAGNOSIS — G4484 Primary exertional headache: Secondary | ICD-10-CM

## 2016-09-17 DIAGNOSIS — Z5112 Encounter for antineoplastic immunotherapy: Secondary | ICD-10-CM

## 2016-09-17 DIAGNOSIS — Z79899 Other long term (current) drug therapy: Secondary | ICD-10-CM

## 2016-09-17 DIAGNOSIS — R519 Headache, unspecified: Secondary | ICD-10-CM

## 2016-09-17 DIAGNOSIS — G4451 Hemicrania continua: Secondary | ICD-10-CM | POA: Diagnosis not present

## 2016-09-17 DIAGNOSIS — J449 Chronic obstructive pulmonary disease, unspecified: Secondary | ICD-10-CM | POA: Diagnosis not present

## 2016-09-17 DIAGNOSIS — C7801 Secondary malignant neoplasm of right lung: Secondary | ICD-10-CM

## 2016-09-17 DIAGNOSIS — I6523 Occlusion and stenosis of bilateral carotid arteries: Secondary | ICD-10-CM | POA: Diagnosis not present

## 2016-09-17 DIAGNOSIS — C3491 Malignant neoplasm of unspecified part of right bronchus or lung: Secondary | ICD-10-CM

## 2016-09-17 DIAGNOSIS — Z5111 Encounter for antineoplastic chemotherapy: Secondary | ICD-10-CM

## 2016-09-17 DIAGNOSIS — R51 Headache: Secondary | ICD-10-CM

## 2016-09-17 LAB — COMPREHENSIVE METABOLIC PANEL
ALBUMIN: 3.2 g/dL — AB (ref 3.5–5.0)
ALK PHOS: 126 U/L (ref 40–150)
ALT: 16 U/L (ref 0–55)
AST: 26 U/L (ref 5–34)
Anion Gap: 8 mEq/L (ref 3–11)
BUN: 13.3 mg/dL (ref 7.0–26.0)
CALCIUM: 9.8 mg/dL (ref 8.4–10.4)
CO2: 30 mEq/L — ABNORMAL HIGH (ref 22–29)
CREATININE: 0.8 mg/dL (ref 0.7–1.3)
Chloride: 101 mEq/L (ref 98–109)
EGFR: >90 mL/min/{1.73_m2} (ref >90)
Glucose: 100 mg/dl (ref 70–140)
Potassium: 5 mEq/L (ref 3.5–5.1)
Sodium: 138 mEq/L (ref 136–145)
Total Bilirubin: 0.34 mg/dL (ref 0.20–1.20)
Total Protein: 6.4 g/dL (ref 6.4–8.3)

## 2016-09-17 LAB — CBC WITH DIFFERENTIAL/PLATELET
BASO%: 1 % (ref 0.0–2.0)
BASOS ABS: 0.1 10*3/uL (ref 0.0–0.1)
EOS%: 0.9 % (ref 0.0–7.0)
Eosinophils Absolute: 0.1 10*3/uL (ref 0.0–0.5)
HEMATOCRIT: 37.5 % — AB (ref 38.4–49.9)
HEMOGLOBIN: 12.1 g/dL — AB (ref 13.0–17.1)
LYMPH#: 0.5 10*3/uL — AB (ref 0.9–3.3)
LYMPH%: 4.4 % — ABNORMAL LOW (ref 14.0–49.0)
MCH: 27.7 pg (ref 27.2–33.4)
MCHC: 32.2 g/dL (ref 32.0–36.0)
MCV: 86 fL (ref 79.3–98.0)
MONO#: 0.6 10*3/uL (ref 0.1–0.9)
MONO%: 5.3 % (ref 0.0–14.0)
NEUT#: 9.8 10*3/uL — ABNORMAL HIGH (ref 1.5–6.5)
NEUT%: 88.4 % — ABNORMAL HIGH (ref 39.0–75.0)
Platelets: 146 10*3/uL (ref 140–400)
RBC: 4.36 10*6/uL (ref 4.20–5.82)
RDW: 17.1 % — AB (ref 11.0–14.6)
WBC: 11 10*3/uL — ABNORMAL HIGH (ref 4.0–10.3)

## 2016-09-17 LAB — TSH: TSH: 1.82 m[IU]/L (ref 0.320–4.118)

## 2016-09-17 MED ORDER — PROCHLORPERAZINE MALEATE 10 MG PO TABS: 10.0000 mg | ORAL_TABLET | Freq: Four times a day (QID) | ORAL | 0 refills | Status: DC | PRN

## 2016-09-17 MED ORDER — DEXAMETHASONE 4 MG PO TABS: ORAL_TABLET | ORAL | 1 refills | Status: DC

## 2016-09-17 NOTE — Progress Notes (Signed)
[facility] [phone]   [phone]  OFFICE PROGRESS NOTE  Ernest Calico, MD [address] [name] [address]  DIAGNOSIS: Metastatic non-small cell lung cancer, poorly differentiated carcinoma initially diagnosed as Stage IIIA (T3, N2, M0) non-small cell lung cancer consistent with squamous cell carcinoma involving the left suprahilar mass with mediastinal lymphadenopathy diagnosed in November of 2014. The patient has recurrence in February 2017. PDL 1 expression 0%.  PRIOR THERAPY:  1) Concurrent chemoradiation with weekly carboplatin for AUC of 2 and paclitaxel 45 mg/M2, status post 7 cycles, last dose was given 12/20/2013. First dose on 11/01/2013. 2) Consolidation chemotherapy with carboplatin for AUC of 5 and paclitaxel 175 mg/M2 every 3 weeks with Neulasta support. First cycle on 02/08/2014. Status post 3 cycles and carboplatin was discontinued secondary to allergic reaction. 3) Abraxane 100 MG/M2 on days 1 and 8 every 3 weeks. Status post 3 cycles. Last dose was given 04/16/2016 discontinued secondary to disease progression. 4) Nivolumab 240 MG IV every 2 weeks. First dose 05/27/2016. Status post one cycle. Discontinued secondary to disease progression.   CURRENT THERAPY: Systemic chemotherapy with docetaxel 75 MG/M2 and Cyramza 10 MG/KG every 3 weeks, first dose 09/24/2016.  CHEMOTHERAPY INTENT: Palliative  CURRENT # OF CHEMOTHERAPY CYCLES: 1  CURRENT ANTIEMETICS: Zofran, dexamethasone and Compazine  CURRENT SMOKING STATUS: Former smoker  ORAL CHEMOTHERAPY AND CONSENT: None  CURRENT BISPHOSPHONATES USE: None  PAIN MANAGEMENT: 0/10  NARCOTICS INDUCED CONSTIPATION: None.  LIVING WILL AND CODE STATUS: Full code.   INTERVAL HISTORY: Ernest [name] 60 y.o. male returns to the clinic today for follow-up visit accompanied by his wife. He is feeling fine today except for the persistent shortness of breath. He is  currently on home oxygen. He denied having any significant chest pain or hemoptysis. He has no weight loss or night sweats. He denied having any fever or chills. He denied having any nausea, vomiting, diarrhea or constipation. He was considered for enrollment in a clinical trial according to the SWOG S1400 that unfortunately was not eligible for the trial because of the previous treatment with Nivolumab. He is here today for evaluation and discussion of his other treatment options.  MEDICAL HISTORY: Past Medical History:  Diagnosis Date  . Anxiety   . CAD (coronary artery disease)    Left Main 30% stenosis, LAD 20 - 30 % stenosis, first and second diagonal branchesat 40 - 50%  stenosis with small arteries, circumflex had 30% stenosis in the large obtuse marginal, RCA at 70 - 80%  stenosis [not felt to be occlusive after evaluation with flow wire], distal 50 - 60% stenosis - Ernest [name][  . Cancer ([place])    lung  . Chronic insomnia   . COPD (chronic obstructive pulmonary disease) (HCC)    Dr. Baird Lyons  . COPD with asthma ([facility]) 09/08/2007  . DDD (degenerative disc disease)   . Depression   . Diabetes mellitus without complication ([place]) 05/10/736   no medications now 07/24/2016  . GERD (gastroesophageal reflux disease)   . Gout   . History of cardiovascular stress test    Myoview 7/16:  Diaphragmatic attenuation, no ischemia, EF 56%; Low Risk  . History of radiation therapy 11/10/13- 12/29/13   left lung 6600 cGy in 33 sessions  . Hx of colonoscopy   . Hyperlipidemia   . Hypertension    dr Percival Spanish  . Itching due to drug 03/19/2016  . Lung cancer ([facility]) 10/04/13  LUL squamous cell lung cancer  . Nausea with vomiting 03/26/2016  . OSA (obstructive sleep apnea)    NPSG 09/10/10- AHI 11.3/hr  . PVD (peripheral vascular disease) (HCC)    PTA/Stent right common iliac    ALLERGIES:  is allergic to carboplatin.  MEDICATIONS:  Current Outpatient Prescriptions  Medication Sig Dispense  Refill  . ADVAIR DISKUS 500-50 MCG/DOSE AEPB 1 PUFF THEN RINSE MOUTH, TWICE DAILY MAINTENANCE 60 each 2  . albuterol (PROVENTIL) (2.5 MG/3ML) 0.083% nebulizer solution INHALE 1 VIAL IN NEBULIZER EVERY 4 HOURS AS NEEDED FOR WHEEZING OR SHORTNESS OF BREATH 300 mL 2  . ARIPiprazole (ABILIFY) 2 MG tablet Take 2 mg by mouth at bedtime.   4  . Armodafinil 250 MG tablet Take 250 mg by mouth every morning.  5  . aspirin 81 MG EC tablet TAKE 1 TABLET BY MOUTH EVERY DAY 30 tablet 5  . atorvastatin (LIPITOR) 20 MG tablet TAKE 1 TABLET EVERY DAY (Patient taking differently: TAKE 20 MG  EVERY DAY IN THE EVENING) 90 tablet 3  . clonazePAM (KLONOPIN) 1 MG tablet Take 2 mg by mouth at bedtime as needed for anxiety. Reported on 01/16/2016    . dextromethorphan-guaiFENesin (MUCINEX DM) 30-600 MG 12hr tablet Take 1 tablet by mouth 2 (two) times daily as needed for cough (congestion).    [name] esomeprazole (NEXIUM) 40 MG capsule TAKE ONE CAPSULE BY MOUTH EVERY DAY (Patient taking differently: TAKE 40 MG  BY MOUTH TWICE DAILY.) 90 capsule 3  . feeding supplement, ENSURE ENLIVE, (ENSURE ENLIVE) LIQD Take 237 mLs by mouth 2 (two) times daily between meals. 237 mL 12  . FLUoxetine (PROZAC) 40 MG capsule Take 40 mg by mouth every morning.    . Gabapentin Enacarbil ER (HORIZANT) 300 MG TBCR Take 1 capsule by mouth daily. 30 tablet 2  . glipiZIDE (GLUCOTROL) 5 MG tablet Take 0.5 tablets (2.5 mg total) by mouth daily before breakfast. 30 tablet 5  . HYDROmorphone (DILAUDID) 4 MG tablet Take 1 tablet (4 mg total) by mouth every 4 (four) hours as needed for severe pain. 20 tablet 0  . indomethacin (INDOCIN) 25 MG capsule Take 1 tablet three times daily with meals x 3 days, then increase to 2 tablet three times daily x 3 days. 90 capsule 1  . KLOR-CON M20 20 MEQ tablet TAKE 1 TABLET BY MOUTH EVERY MORNING 90 tablet 1  . metoprolol succinate (TOPROL-XL) 25 MG 24 hr tablet Take 25 mg by mouth daily.    . mirtazapine (REMERON) 45 MG  tablet Take 1 tablet (45 mg total) by mouth at bedtime.    [name] morphine (MS CONTIN) 30 MG 12 hr tablet Take 1 tablet (30 mg total) by mouth every 12 (twelve) hours. 60 tablet 0  . morphine (MSIR) 15 MG tablet Take 1 tablet (15 mg total) by mouth every 4 (four) hours as needed for severe pain. 30 tablet 0  . ondansetron (ZOFRAN) 8 MG tablet TAKE 1 TABLET (8 MG TOTAL) BY MOUTH EVERY 8 (EIGHT) HOURS AS NEEDED FOR NAUSEA OR VOMITING. 20 tablet 0  . OXYGEN Inhale 2 L into the lungs daily.     . predniSONE (DELTASONE) 5 MG tablet Take 1 tablet (5 mg total) by mouth daily with breakfast. 30 tablet 1  . prochlorperazine (COMPAZINE) 10 MG tablet TAKE 1 TABLET EVERY 6 HOURS AS NEEDED FOR NAUSEA AND VOMITING 30 tablet 1  . theophylline (UNIPHYL) 400 MG 24 hr tablet Take 1 tablet (400 mg total)  by mouth daily. 30 tablet 5  . Tiotropium Bromide Monohydrate (SPIRIVA RESPIMAT) 1.25 MCG/ACT AERS Inhale 2 puffs into the lungs daily. 4 g 11  . VENTOLIN HFA 108 (90 Base) MCG/ACT inhaler INHALE 2 PUFFS INTO THE LUNGS 4 TIMES DAILY AS NEEDED FOR WHEEZING 18 Inhaler 2  . Methylnaltrexone Bromide (RELISTOR) 150 MG TABS Take 3 tablets by mouth every morning. (Patient not taking: Reported on 09/17/2016) 90 tablet 11  . nitroGLYCERIN (NITROSTAT) 0.4 MG SL tablet Place 1 tablet (0.4 mg total) under the tongue every 5 (five) minutes as needed for chest pain. (Patient not taking: Reported on 09/17/2016) 25 tablet 3   No current facility-administered medications for this visit.     SURGICAL HISTORY:  Past Surgical History:  Procedure Laterality Date  . ANGIOPLASTY    . ANTERIOR FUSION CERVICAL SPINE     cervical fusion  7 yrs ago ([facility])  . arm surgery     left elbow  . BACK SURGERY     lower  . c-spine surgery    . COLON SURGERY     '11 "Diverticulitis"  . COLONOSCOPY WITH PROPOFOL N/A 05/22/2015   Procedure: COLONOSCOPY WITH PROPOFOL;  Surgeon: Inda Castle, MD;  Location: [facility];  Service: Endoscopy;   Laterality: N/A;  . HIP SURGERY     left 'bone graft taken"  . PERIPHERAL VASCULAR CATHETERIZATION N/A 08/10/2015   Procedure: Lower Extremity Angiography;  Surgeon: Lorretta Harp, MD; Distal Ao OK, L-EIA stent OK, R-CIA 100% s/p overlapping 7 mm x 38 mm ICast stents, 50-60% R-CFA      . SHOULDER SURGERY     right  . SPINAL FUSION  03/05/2007   L4-L5  . VIDEO BRONCHOSCOPY WITH ENDOBRONCHIAL NAVIGATION N/A 10/04/2013   Procedure: VIDEO BRONCHOSCOPY WITH ENDOBRONCHIAL NAVIGATION;  Surgeon: Collene Gobble, MD;  Location: [facility];  Service: Thoracic;  Laterality: N/A;    REVIEW OF SYSTEMS:  Constitutional: positive for fatigue Eyes: negative Ears, nose, mouth, throat, and face: negative Respiratory: positive for cough, dyspnea on exertion and emphysema Cardiovascular: negative Gastrointestinal: negative Genitourinary:negative Integument/breast: negative Hematologic/lymphatic: negative Musculoskeletal:negative Neurological: negative Behavioral/Psych: negative Endocrine: negative Allergic/Immunologic: negative   PHYSICAL EXAMINATION: General appearance: alert, cooperative and no distress Head: Normocephalic, without obvious abnormality, atraumatic Neck: no adenopathy, no JVD, supple, symmetrical, trachea midline and thyroid not enlarged, symmetric, no tenderness/mass/nodules Lymph nodes: Cervical, supraclavicular, and axillary nodes normal. Resp: wheezes bilaterally Back: symmetric, no curvature. ROM normal. No CVA tenderness. Cardio: regular rate and rhythm, S1, S2 normal, no murmur, click, rub or gallop GI: soft, non-tender; bowel sounds normal; no masses,  no organomegaly Extremities: extremities normal, atraumatic, no cyanosis or edema Neurologic: Alert and oriented X 3, normal strength and tone. Normal symmetric reflexes. Normal coordination and gait  ECOG PERFORMANCE STATUS: 1 - Symptomatic but completely ambulatory  Blood pressure (!) 123/58, pulse 88, temperature 97.9 F  (36.6 C), temperature source Oral, resp. rate 17, height _0  (1.676 m), weight 185 lb 9.6 oz (84.2 kg), SpO2 94 %.  LABORATORY DATA: Lab Results  Component Value Date   WBC 11.0 (H) 09/17/2016   HGB 12.1 (L) 09/17/2016   HCT 37.5 (L) 09/17/2016   MCV 86.0 09/17/2016   PLT 146 09/17/2016      Chemistry      Component Value Date/Time   NA 138 09/17/2016 1448   K 5.0 09/17/2016 1448   CL 96 (L) 09/12/2016 1851   CO2 30 (H) 09/17/2016 1448   BUN 13.3  09/17/2016 1448   CREATININE 0.8 09/17/2016 1448      Component Value Date/Time   CALCIUM 9.8 09/17/2016 1448   ALKPHOS 126 09/17/2016 1448   AST 26 09/17/2016 1448   ALT 16 09/17/2016 1448   BILITOT 0.34 09/17/2016 1448       RADIOGRAPHIC STUDIES: Dg Chest 2 View  Result Date: 09/12/2016 CLINICAL DATA:  Mid chest pain and shortness of breath beginning last night. History of lung cancer, COPD, diabetes and hypertension. EXAM: CHEST  2 VIEW COMPARISON:  Chest radiograph August 13, 2016 and CT chest July 24, 2016. FINDINGS: Stable post treatment changes of LEFT lung apex. Multiple masses project along the RIGHT mediastinal margin, unchanged. No pleural effusion. Similar RIGHT lung base scarring. No focal consolidation. Cardiac silhouette is normal in size. No pneumothorax. ACDF. Chronic deformity RIGHT distal clavicle. IMPRESSION: No acute cardiopulmonary process. Relatively stable appearance of RIGHT lung/ mediastinal masses and posttreatment changes LEFT lung apex. Electronically Signed   By: Elon Alas M.D.   On: 09/12/2016 20:21   Ct Head Wo Contrast  Result Date: 09/12/2016 CLINICAL DATA:  Right-sided headache beginning earlier today. Patient with lung cancer EXAM: CT HEAD WITHOUT CONTRAST TECHNIQUE: Contiguous axial images were obtained from the base of the skull through the vertex without intravenous contrast. COMPARISON:  MRI 07/23/2016 FINDINGS: Brain: No evidence of malformation, atrophy, old or acute small or  large vessel infarction, mass lesion, hemorrhage, hydrocephalus or extra-axial collection. No evidence of pituitary lesion. Vascular: There is atherosclerotic calcification of the major vessels at the base of the brain. Skull: Normal.  No fracture or focal bone lesion. Sinuses/Orbits: Visualized sinuses are clear. No fluid in the middle ears or mastoids. Visualized orbits are normal. Other: None significant IMPRESSION: Normal head CT. No abnormality seen in the region of the left precentral gyrus by CT. Tiny focus of restricted diffusion was seen in that location on MRI. Electronically Signed   By: Nelson Chimes M.D.   On: 09/12/2016 21:18    ASSESSMENT AND PLAN: This is a very pleasant 60 years old white male with stage IIIA non-small cell lung cancer, squamous cell carcinoma currently undergoing concurrent chemoradiation with weekly carboplatin and paclitaxel status post 7 weeks of treatment. He tolerated his treatment fairly well with no significant adverse effects. This was followed by consolidation chemotherapy with carboplatin and paclitaxel status post 3 cycles, .carboplatin was discontinued after cycle #2 secondary to hypersensitivity reaction. The recent CT scan of the chest progressive enlargement of the right lower lobe lung nodule concerning for pulmonary metastasis or new synchronous tumor. The recent PET scan showed hypermetabolic activity in the 1.9 cm right lower lobe pulmonary nodule suspicious for metastasis. There was also small mediastinal node metastasis. CT-guided core biopsy of the right lower lobe pulmonary nodule showed poorly differentiated carcinoma. PDL 1 expression was 0% The patient was started on treatment with single agent Abraxane status post 3 cycles but unfortunately restaging CT scan of the chest showed evidence for disease progression. He was then started on treatment with immunotherapy with Nivolumab status post 1 cycle.[name]  Unfortunately the recent CT scan of the chest  showed significant disease progression but this is not indicative of his response to the immunotherapy as he received only one cycle. Another CT scan of the chest, abdomen and pelvis performed on 07/24/2016 showed further disease progression. The patient is no take candidate for for the clinical trial with a MET inhibitor at [facility]. I discussed with the patient several options for  management of his condition including treatment with systemic chemotherapy with docetaxel and Cyramza versus single agent Navelbine versus palliative care and hospice referral. The patient is interested in proceeding with treatment with docetaxel and Cyramza. He will be treated with docetaxel 75 MG/M2 and Cyramza 10 MG/KG every 3 weeks with Neulasta support. I discussed with the patient adverse effect of this treatment including but not limited to alopecia, myelosuppression, nausea and vomiting, peripheral neuropathy, liver or renal dysfunction as well as the adverse effect of Cyramza including risk of pulmonary hemorrhage, GI perforation, hypertension or proteinuria. He is expected to start the first cycle of this treatment on 09/24/2016. I will call his pharmacy was prescription for Decadron 8 mg by mouth twice a day the day before, day of and day after the chemotherapy every 3 weeks in addition to Compazine 10 mg by mouth every 6 hours as needed for nausea. The patient would come back for follow-up visit in 4 weeks with the start of cycle #2. I will see him for follow-up visit after evaluation for the clinical trial. He was advised to call immediately if he has any concerning symptoms in the interval. The patient voices understanding of current disease status and treatment options and is in agreement with the current care plan.  All questions were answered. The patient knows to call the clinic with any problems, questions or concerns. We can certainly see the patient much sooner if necessary.  Disclaimer: This  note was dictated with voice recognition software. Similar sounding words can inadvertently be transcribed and may not be corrected upon review.

## 2016-09-17 NOTE — Progress Notes (Signed)
START ON PATHWAY REGIMEN - Non-Small Cell Lung  [id number]: Docetaxel 75 mg/m2 + Ramucirumab 10 mg/kg q21 Days Until Progression or Unacceptable Toxicity   A cycle is every 21 days:     Ramucirumab (Cyramza (R)) 10 mg/kg in 250 mL NS IV over 60 minutes every 21 days on day 1, PRIOR TO DOCETAXEL. Not stable in D5W - dilute in NS only. Urine protein check recommended at baseline and periodically during therapy Dose Mod: None     Docetaxel (Taxotere(R)) 75 mg/m2 in 250 mL NS IV over 60 minutes every 21 days on day 1 Dose Mod: None Additional Orders: Premedicate with dexamethasone 8 mg PO BID for three days beginning 1 day prior to therapy.  **Always confirm dose/schedule in your pharmacy ordering system**    Patient Characteristics: Stage IV Metastatic, Squamous, PS = 0, 1, Third Line, Prior PD-1/PD-L1 Inhibitor or No Prior PD-1/PD-L1 Inhibitor and Not a Candidate for Immunotherapy AJCC M Stage: 1 AJCC N Stage: 2 AJCC T Stage: 3 Current Disease Status: Distant Metastases AJCC Stage Grouping: IV Histology: Squamous Cell Line of therapy: Third Line PD-L1 Expression Status: PD-L1 Negative Performance Status: PS = 0, 1 Would you be surprised if this patient died  in the next year? I would NOT be surprised if this patient died in the next year Immunotherapy Candidate Status: Not a Candidate for Immunotherapy Prior Immunotherapy Status: Prior PD-1/PD-L1 Inhibitor  Intent of Therapy: Non-Curative / Palliative Intent, Discussed with Patient

## 2016-09-17 NOTE — Telephone Encounter (Signed)
Message to inf supervisor for ok to start tx 10/31. Patient aware he will be contacted re appointments.

## 2016-09-18 ENCOUNTER — Telehealth: Payer: Self-pay | Admitting: Internal Medicine

## 2016-09-18 ENCOUNTER — Inpatient Hospital Stay: Admission: RE | Admit: 2016-09-18 | Payer: Commercial Managed Care - HMO | Source: Ambulatory Visit

## 2016-09-18 NOTE — Telephone Encounter (Signed)
Per inf supervisor ok to add tx and start 11/1. Left message for patient re next appointment for 11/1 and mailed schedule for November thru January.  Patient made aware prior to leaving yesterday that I would call with appointments.

## 2016-09-19 ENCOUNTER — Telehealth: Payer: Self-pay | Admitting: Internal Medicine

## 2016-09-19 MED ORDER — PREDNISONE 10 MG PO TABS: ORAL_TABLET | ORAL | 0 refills | Status: DC

## 2016-09-19 NOTE — Telephone Encounter (Signed)
Offer prednisone 10 mg, # 20, 4 X 2 DAYS, 3 X 2 DAYS, 2 X 2 DAYS, 1 X 2 DAYS   When he finishes the taper, stay on his usual maintenance dose

## 2016-09-19 NOTE — Telephone Encounter (Signed)
Called and spoke with Tammy and she is aware of recs per CY.  meds have been sent to the pharmacy. Nothing further is needed

## 2016-09-19 NOTE — Telephone Encounter (Signed)
[facility]([phone])- returned phone call.Mearl Latin

## 2016-09-19 NOTE — Telephone Encounter (Signed)
[email or web address] x1 

## 2016-09-19 NOTE — Telephone Encounter (Signed)
Called and spoke to [place], Therapist, sports, with [facility]. Tammy states the pt has an increase in SOB, prod cough with white mucus, increase in activity intoelrance x 3 days. Tammy is requesting prednisone, pt already taking '5mg'$  daily.   Dr. Annamaria Boots please advise. Thanks.   Allergies  Allergen Reactions  . Carboplatin Shortness Of Breath, Swelling and Rash    Swelling of lips, rash on face,eyes and head    Current Outpatient Prescriptions on File Prior to Visit  Medication Sig Dispense Refill  . ADVAIR DISKUS 500-50 MCG/DOSE AEPB 1 PUFF THEN RINSE MOUTH, TWICE DAILY MAINTENANCE 60 each 2  . albuterol (PROVENTIL) (2.5 MG/3ML) 0.083% nebulizer solution INHALE 1 VIAL IN NEBULIZER EVERY 4 HOURS AS NEEDED FOR WHEEZING OR SHORTNESS OF BREATH 300 mL 2  . ARIPiprazole (ABILIFY) 2 MG tablet Take 2 mg by mouth at bedtime.   4  . Armodafinil 250 MG tablet Take 250 mg by mouth every morning.  5  . aspirin 81 MG EC tablet TAKE 1 TABLET BY MOUTH EVERY DAY 30 tablet 5  . atorvastatin (LIPITOR) 20 MG tablet TAKE 1 TABLET EVERY DAY (Patient taking differently: TAKE 20 MG  EVERY DAY IN THE EVENING) 90 tablet 3  . clonazePAM (KLONOPIN) 1 MG tablet Take 2 mg by mouth at bedtime as needed for anxiety. Reported on 01/16/2016    . dexamethasone (DECADRON) 4 MG tablet 2 tablets by mouth twice a day the day before, day of and day after the chemotherapy every 3 weeks 40 tablet 1  . dextromethorphan-guaiFENesin (MUCINEX DM) 30-600 MG 12hr tablet Take 1 tablet by mouth 2 (two) times daily as needed for cough (congestion).    [name] esomeprazole (NEXIUM) 40 MG capsule TAKE ONE CAPSULE BY MOUTH EVERY DAY (Patient taking differently: TAKE 40 MG  BY MOUTH TWICE DAILY.) 90 capsule 3  . feeding supplement, ENSURE ENLIVE, (ENSURE ENLIVE) LIQD Take 237 mLs by mouth 2 (two) times daily between meals. 237 mL 12  . FLUoxetine (PROZAC) 40 MG capsule Take 40 mg by mouth every morning.    . Gabapentin Enacarbil ER (HORIZANT) 300 MG TBCR Take 1  capsule by mouth daily. 30 tablet 2  . glipiZIDE (GLUCOTROL) 5 MG tablet Take 0.5 tablets (2.5 mg total) by mouth daily before breakfast. 30 tablet 5  . HYDROmorphone (DILAUDID) 4 MG tablet Take 1 tablet (4 mg total) by mouth every 4 (four) hours as needed for severe pain. 20 tablet 0  . indomethacin (INDOCIN) 25 MG capsule Take 1 tablet three times daily with meals x 3 days, then increase to 2 tablet three times daily x 3 days. 90 capsule 1  . KLOR-CON M20 20 MEQ tablet TAKE 1 TABLET BY MOUTH EVERY MORNING 90 tablet 1  . Methylnaltrexone Bromide (RELISTOR) 150 MG TABS Take 3 tablets by mouth every morning. (Patient not taking: Reported on 09/17/2016) 90 tablet 11  . metoprolol succinate (TOPROL-XL) 25 MG 24 hr tablet Take 25 mg by mouth daily.    . mirtazapine (REMERON) 45 MG tablet Take 1 tablet (45 mg total) by mouth at bedtime.    [name] morphine (MS CONTIN) 30 MG 12 hr tablet Take 1 tablet (30 mg total) by mouth every 12 (twelve) hours. 60 tablet 0  . morphine (MSIR) 15 MG tablet Take 1 tablet (15 mg total) by mouth every 4 (four) hours as needed for severe pain. 30 tablet 0  . nitroGLYCERIN (NITROSTAT) 0.4 MG SL tablet Place 1 tablet (0.4 mg total) under the  tongue every 5 (five) minutes as needed for chest pain. (Patient not taking: Reported on 09/17/2016) 25 tablet 3  . ondansetron (ZOFRAN) 8 MG tablet TAKE 1 TABLET (8 MG TOTAL) BY MOUTH EVERY 8 (EIGHT) HOURS AS NEEDED FOR NAUSEA OR VOMITING. 20 tablet 0  . OXYGEN Inhale 2 L into the lungs daily.     . predniSONE (DELTASONE) 5 MG tablet Take 1 tablet (5 mg total) by mouth daily with breakfast. 30 tablet 1  . prochlorperazine (COMPAZINE) 10 MG tablet Take 1 tablet (10 mg total) by mouth every 6 (six) hours as needed for nausea or vomiting. 30 tablet 0  . theophylline (UNIPHYL) 400 MG 24 hr tablet Take 1 tablet (400 mg total) by mouth daily. 30 tablet 5  . Tiotropium Bromide Monohydrate (SPIRIVA RESPIMAT) 1.25 MCG/ACT AERS Inhale 2 puffs into the  lungs daily. 4 g 11  . VENTOLIN HFA 108 (90 Base) MCG/ACT inhaler INHALE 2 PUFFS INTO THE LUNGS 4 TIMES DAILY AS NEEDED FOR WHEEZING 18 Inhaler 2   No current facility-administered medications on file prior to visit.

## 2016-09-20 ENCOUNTER — Telehealth: Payer: Self-pay | Admitting: Neurology

## 2016-09-20 ENCOUNTER — Other Ambulatory Visit: Payer: Self-pay | Admitting: *Deleted

## 2016-09-20 MED ORDER — TOPIRAMATE 25 MG PO TABS: 25.0000 mg | ORAL_TABLET | Freq: Every day | ORAL | 3 refills | Status: DC

## 2016-09-20 NOTE — Telephone Encounter (Signed)
Patient said that his headaches are doing a little better so Dr. Posey Pronto would like for him to increase indomethacin to 75 mg tid and add topamax if needed.  Rx sent in.

## 2016-09-20 NOTE — Telephone Encounter (Signed)
Please find out if  (1) Did he try 12L oxygen therapy or (2) did he take indomethicin '50mg'$  three times daily?  If he did not tolerate this, start topiramate '25mg'$  daily.  Side effects include numbness/tingling, cognitive slowing, rare cases of kidney stones so be sure to stay well hydrated.  Usually this is dose-dependent and we are starting at a low dose.  Kamora Vossler K. Posey Pronto, DO

## 2016-09-20 NOTE — Telephone Encounter (Signed)
Ernest [name] 03/29/2056. He left a message regarding his cluster headaches. He said it has been 10 days and still no better. His # is [phone]. Thank you

## 2016-09-20 NOTE — Telephone Encounter (Signed)
Please advise 

## 2016-09-23 ENCOUNTER — Other Ambulatory Visit: Payer: Self-pay | Admitting: Family Medicine

## 2016-09-23 ENCOUNTER — Telehealth: Payer: Self-pay | Admitting: Neurology

## 2016-09-23 DIAGNOSIS — J449 Chronic obstructive pulmonary disease, unspecified: Secondary | ICD-10-CM | POA: Diagnosis not present

## 2016-09-23 DIAGNOSIS — C342 Malignant neoplasm of middle lobe, bronchus or lung: Secondary | ICD-10-CM | POA: Diagnosis not present

## 2016-09-23 DIAGNOSIS — J9601 Acute respiratory failure with hypoxia: Secondary | ICD-10-CM | POA: Diagnosis not present

## 2016-09-23 NOTE — Telephone Encounter (Signed)
Did he try the topiramate that was discussed on Friday?  See previous telephone note.   He can come in for a toradol '30mg'$  injection with reglan '10mg'$ , if needed.

## 2016-09-23 NOTE — Telephone Encounter (Signed)
Ernest [name] 22-Jan-2056. Would like another medication to try, still having headaches and it is making him sick.  His # is [phone]. His pharmacy is [facility] on [address]. Thank you

## 2016-09-23 NOTE — Telephone Encounter (Signed)
Any suggestions>?

## 2016-09-24 ENCOUNTER — Other Ambulatory Visit (INDEPENDENT_AMBULATORY_CARE_PROVIDER_SITE_OTHER): Payer: Commercial Managed Care - HMO

## 2016-09-24 ENCOUNTER — Encounter: Payer: Self-pay | Admitting: Pharmacist

## 2016-09-24 ENCOUNTER — Ambulatory Visit (INDEPENDENT_AMBULATORY_CARE_PROVIDER_SITE_OTHER): Payer: Commercial Managed Care - HMO | Admitting: Internal Medicine

## 2016-09-24 ENCOUNTER — Ambulatory Visit: Payer: Commercial Managed Care - HMO | Admitting: Internal Medicine

## 2016-09-24 ENCOUNTER — Encounter: Payer: Self-pay | Admitting: Internal Medicine

## 2016-09-24 VITALS — BP 130/60 | HR 92 | Temp 98.0°F | Resp 16 | Ht 66.0 in | Wt 175.0 lb

## 2016-09-24 DIAGNOSIS — G893 Neoplasm related pain (acute) (chronic): Secondary | ICD-10-CM

## 2016-09-24 DIAGNOSIS — E119 Type 2 diabetes mellitus without complications: Secondary | ICD-10-CM

## 2016-09-24 DIAGNOSIS — I10 Essential (primary) hypertension: Secondary | ICD-10-CM

## 2016-09-24 DIAGNOSIS — G894 Chronic pain syndrome: Secondary | ICD-10-CM

## 2016-09-24 LAB — BASIC METABOLIC PANEL
BUN: 15 mg/dL (ref 6–23)
CHLORIDE: 96 meq/L (ref 96–112)
CO2: 31 mEq/L (ref 19–32)
CREATININE: 0.95 mg/dL (ref 0.40–1.50)
Calcium: 9.8 mg/dL (ref 8.4–10.5)
GFR: 85.45 mL/min (ref >60.00)
GLUCOSE: 109 mg/dL — AB (ref 70–99)
Potassium: 4.5 mEq/L (ref 3.5–5.1)
Sodium: 137 mEq/L (ref 135–145)

## 2016-09-24 LAB — HEMOGLOBIN A1C: Hgb A1c MFr Bld: 5.8 % (ref 4.6–6.5)

## 2016-09-24 MED ORDER — MORPHINE SULFATE 15 MG PO TABS: 15.0000 mg | ORAL_TABLET | ORAL | 0 refills | Status: DC | PRN

## 2016-09-24 MED ORDER — MORPHINE SULFATE ER 30 MG PO TBCR: 30.0000 mg | EXTENDED_RELEASE_TABLET | Freq: Two times a day (BID) | ORAL | 0 refills | Status: DC

## 2016-09-24 NOTE — Patient Instructions (Signed)

## 2016-09-24 NOTE — Progress Notes (Signed)
Pre visit review using our clinic review tool, if applicable. No additional management support is needed unless otherwise documented below in the visit note. 

## 2016-09-24 NOTE — Progress Notes (Signed)
Subjective:  Patient ID: Ernest [name], male    DOB: 04-Mar-1956  Age: 60 y.o. MRN: [id number]  CC: Back Pain and Diabetes   HPI Ernest [name] presents for f/up on DM2 and chronic pain management. Since I saw him he saw a neurologist and was diagnosed with cluster headaches. Fortunately his headache has resolved. He does struggle with chronic pain throughout his chest and back related to his diagnosis of lung cancer. He tells me his blood sugars have been well controlled and he has had no recent episodes of polys.  Outpatient Medications Prior to Visit  Medication Sig Dispense Refill  . ADVAIR DISKUS 500-50 MCG/DOSE AEPB 1 PUFF THEN RINSE MOUTH, TWICE DAILY MAINTENANCE 60 each 2  . albuterol (PROVENTIL) (2.5 MG/3ML) 0.083% nebulizer solution INHALE 1 VIAL IN NEBULIZER EVERY 4 HOURS AS NEEDED FOR WHEEZING OR SHORTNESS OF BREATH 300 mL 2  . ARIPiprazole (ABILIFY) 2 MG tablet Take 2 mg by mouth at bedtime.   4  . Armodafinil 250 MG tablet Take 250 mg by mouth every morning.  5  . aspirin 81 MG EC tablet TAKE 1 TABLET BY MOUTH EVERY DAY 30 tablet 5  . atorvastatin (LIPITOR) 20 MG tablet TAKE 1 TABLET EVERY DAY (Patient taking differently: TAKE 20 MG  EVERY DAY IN THE EVENING) 90 tablet 3  . clonazePAM (KLONOPIN) 1 MG tablet Take 2 mg by mouth at bedtime as needed for anxiety. Reported on 01/16/2016    . dexamethasone (DECADRON) 4 MG tablet 2 tablets by mouth twice a day the day before, day of and day after the chemotherapy every 3 weeks 40 tablet 1  . dextromethorphan-guaiFENesin (MUCINEX DM) 30-600 MG 12hr tablet Take 1 tablet by mouth 2 (two) times daily as needed for cough (congestion).    [name] esomeprazole (NEXIUM) 40 MG capsule TAKE ONE CAPSULE BY MOUTH EVERY DAY (Patient taking differently: TAKE 40 MG  BY MOUTH TWICE DAILY.) 90 capsule 3  . feeding supplement, ENSURE ENLIVE, (ENSURE ENLIVE) LIQD Take 237 mLs by mouth 2 (two) times daily between meals. 237 mL 12  . FLUoxetine  (PROZAC) 40 MG capsule Take 40 mg by mouth every morning.    . Gabapentin Enacarbil ER (HORIZANT) 300 MG TBCR Take 1 capsule by mouth daily. 30 tablet 2  . indomethacin (INDOCIN) 25 MG capsule Take 1 tablet three times daily with meals x 3 days, then increase to 2 tablet three times daily x 3 days. 90 capsule 1  . KLOR-CON M20 20 MEQ tablet TAKE 1 TABLET BY MOUTH EVERY MORNING 90 tablet 1  . Methylnaltrexone Bromide (RELISTOR) 150 MG TABS Take 3 tablets by mouth every morning. 90 tablet 11  . metoprolol succinate (TOPROL-XL) 25 MG 24 hr tablet Take 25 mg by mouth daily.    . mirtazapine (REMERON) 45 MG tablet Take 1 tablet (45 mg total) by mouth at bedtime.    . nitroGLYCERIN (NITROSTAT) 0.4 MG SL tablet Place 1 tablet (0.4 mg total) under the tongue every 5 (five) minutes as needed for chest pain. 25 tablet 3  . ondansetron (ZOFRAN) 8 MG tablet TAKE 1 TABLET (8 MG TOTAL) BY MOUTH EVERY 8 (EIGHT) HOURS AS NEEDED FOR NAUSEA OR VOMITING. 20 tablet 0  . OXYGEN Inhale 2 L into the lungs daily.     . predniSONE (DELTASONE) 10 MG tablet 4 x 2 days, 3 x 2 days, 2 x 2 days, 1 x 2 days then back to maintenance dose 20 tablet 0  .  predniSONE (DELTASONE) 5 MG tablet Take 1 tablet (5 mg total) by mouth daily with breakfast. 30 tablet 1  . prochlorperazine (COMPAZINE) 10 MG tablet Take 1 tablet (10 mg total) by mouth every 6 (six) hours as needed for nausea or vomiting. 30 tablet 0  . theophylline (UNIPHYL) 400 MG 24 hr tablet Take 1 tablet (400 mg total) by mouth daily. 30 tablet 5  . Tiotropium Bromide Monohydrate (SPIRIVA RESPIMAT) 1.25 MCG/ACT AERS Inhale 2 puffs into the lungs daily. 4 g 11  . topiramate (TOPAMAX) 25 MG tablet Take 1 tablet (25 mg total) by mouth daily. 30 tablet 3  . VENTOLIN HFA 108 (90 Base) MCG/ACT inhaler INHALE 2 PUFFS INTO THE LUNGS 4 TIMES DAILY AS NEEDED FOR WHEEZING 18 Inhaler 2  . glipiZIDE (GLUCOTROL) 5 MG tablet Take 0.5 tablets (2.5 mg total) by mouth daily before breakfast.  30 tablet 5  . HYDROmorphone (DILAUDID) 4 MG tablet Take 1 tablet (4 mg total) by mouth every 4 (four) hours as needed for severe pain. 20 tablet 0  . morphine (MS CONTIN) 30 MG 12 hr tablet Take 1 tablet (30 mg total) by mouth every 12 (twelve) hours. 60 tablet 0  . morphine (MSIR) 15 MG tablet Take 1 tablet (15 mg total) by mouth every 4 (four) hours as needed for severe pain. 30 tablet 0   No facility-administered medications prior to visit.     ROS Review of Systems  Constitutional: Positive for fatigue. Negative for activity change, appetite change, diaphoresis and fever.  HENT: Negative.  Negative for trouble swallowing.   Eyes: Negative.   Respiratory: Negative for cough, choking, chest tightness, shortness of breath and stridor.   Cardiovascular: Positive for chest pain.  Gastrointestinal: Negative for abdominal pain, blood in stool, constipation, diarrhea, nausea and vomiting.  Endocrine: Negative.  Negative for cold intolerance, heat intolerance, polydipsia, polyphagia and polyuria.  Genitourinary: Negative.  Negative for difficulty urinating.  Musculoskeletal: Positive for back pain. Negative for myalgias and neck pain.  Skin: Negative.   Allergic/Immunologic: Negative.   Neurological: Negative.  Negative for dizziness.  Hematological: Negative.  Negative for adenopathy. Does not bruise/bleed easily.  Psychiatric/Behavioral: Negative.     Objective:  BP 130/60 (BP Location: Left Arm, Patient Position: Sitting, Cuff Size: Normal)   Pulse 92   Temp 98 F (36.7 C) (Oral)   Resp 16   Ht '5\' 6"'$  (1.676 m)   Wt 175 lb (79.4 kg)   SpO2 92%   BMI 28.25 kg/m   BP Readings from Last 3 Encounters:  09/25/16 104/80  09/24/16 130/60  09/17/16 (!) 123/58    Wt Readings from Last 3 Encounters:  09/24/16 175 lb (79.4 kg)  09/17/16 185 lb 9.6 oz (84.2 kg)  09/12/16 183 lb (83 kg)    Physical Exam  Constitutional: He is oriented to person, place, and time. No distress.    HENT:  Mouth/Throat: Oropharynx is clear and moist. No oropharyngeal exudate.  Eyes: Conjunctivae are normal. Right eye exhibits no discharge. Left eye exhibits no discharge. No scleral icterus.  Neck: Normal range of motion. Neck supple. No JVD present. No tracheal deviation present. No thyromegaly present.  Cardiovascular: Normal rate, regular rhythm, normal heart sounds and intact distal pulses.  Exam reveals no gallop and no friction rub.   No murmur heard. Pulmonary/Chest: Effort normal and breath sounds normal. No stridor. No respiratory distress. He has no wheezes. He has no rales. He exhibits no tenderness.  Abdominal: Soft. Bowel sounds  are normal. He exhibits no distension and no mass. There is no tenderness. There is no rebound and no guarding.  Musculoskeletal: Normal range of motion. He exhibits no edema, tenderness or deformity.  Lymphadenopathy:    He has no cervical adenopathy.  Neurological: He is oriented to person, place, and time.  Skin: Skin is warm and dry. No rash noted. He is not diaphoretic. No erythema. No pallor.  Psychiatric: He has a normal mood and affect. His behavior is normal. Judgment and thought content normal.  Vitals reviewed.   Lab Results  Component Value Date   WBC 16.1 (H) 09/25/2016   HGB 13.0 09/25/2016   HCT 40.3 09/25/2016   PLT 201 09/25/2016   GLUCOSE 131 09/25/2016   CHOL 164 10/05/2015   TRIG 174.0 (H) 10/05/2015   HDL 48.70 10/05/2015   LDLDIRECT 71.4 09/08/2012   LDLCALC 80 10/05/2015   ALT 14 09/25/2016   AST 19 09/25/2016   NA 135 (L) 09/25/2016   K 4.3 09/25/2016   CL 96 09/24/2016   CREATININE 0.8 09/25/2016   BUN 14.2 09/25/2016   CO2 26 09/25/2016   TSH 1.820 09/17/2016   PSA 0.64 07/18/2011   INR 1.14 04/21/2016   HGBA1C 5.8 09/24/2016   MICROALBUR 5.7 (H) 10/05/2015    No results found.  Assessment & Plan:   Karlis was seen today for back pain and diabetes.  Diagnoses and all orders for this  visit:  Cancer associated pain -     Discontinue: morphine (MSIR) 15 MG tablet; Take 1 tablet (15 mg total) by mouth every 4 (four) hours as needed for severe pain. -     Discontinue: morphine (MS CONTIN) 30 MG 12 hr tablet; Take 1 tablet (30 mg total) by mouth every 12 (twelve) hours. -     Discontinue: morphine (MS CONTIN) 30 MG 12 hr tablet; Take 1 tablet (30 mg total) by mouth every 12 (twelve) hours. -     Discontinue: morphine (MSIR) 15 MG tablet; Take 1 tablet (15 mg total) by mouth every 4 (four) hours as needed for severe pain. -     morphine (MS CONTIN) 30 MG 12 hr tablet; Take 1 tablet (30 mg total) by mouth every 12 (twelve) hours. -     morphine (MSIR) 15 MG tablet; Take 1 tablet (15 mg total) by mouth every 4 (four) hours as needed for severe pain.  Chronic pain syndrome -     Discontinue: morphine (MSIR) 15 MG tablet; Take 1 tablet (15 mg total) by mouth every 4 (four) hours as needed for severe pain. -     Discontinue: morphine (MS CONTIN) 30 MG 12 hr tablet; Take 1 tablet (30 mg total) by mouth every 12 (twelve) hours. -     Discontinue: morphine (MS CONTIN) 30 MG 12 hr tablet; Take 1 tablet (30 mg total) by mouth every 12 (twelve) hours. -     Discontinue: morphine (MSIR) 15 MG tablet; Take 1 tablet (15 mg total) by mouth every 4 (four) hours as needed for severe pain. -     morphine (MS CONTIN) 30 MG 12 hr tablet; Take 1 tablet (30 mg total) by mouth every 12 (twelve) hours. -     morphine (MSIR) 15 MG tablet; Take 1 tablet (15 mg total) by mouth every 4 (four) hours as needed for severe pain.  Essential hypertension- his blood pressure is well controlled, electrolytes and renal function are stable -     Basic metabolic panel;  Future  Diabetes mellitus without complication ([place])- his P4D is down to 5.8% so I have asked him to stop taking the sulfonylurea due to concerns about hypoglycemia -     Basic metabolic panel; Future -     Hemoglobin A1c; Future   I have  discontinued Mr. Fedora's glipiZIDE and HYDROmorphone. I am also having him maintain his clonazePAM, nitroGLYCERIN, mirtazapine, FLUoxetine, atorvastatin, OXYGEN, dextromethorphan-guaiFENesin, Armodafinil, Tiotropium Bromide Monohydrate, ADVAIR DISKUS, esomeprazole, feeding supplement (ENSURE ENLIVE), VENTOLIN HFA, ARIPiprazole, ondansetron, aspirin, Methylnaltrexone Bromide, KLOR-CON M20, theophylline, albuterol, indomethacin, Gabapentin Enacarbil ER, predniSONE, metoprolol succinate, prochlorperazine, dexamethasone, predniSONE, topiramate, flunisolide, DALIRESP, morphine, and morphine.  Meds ordered this encounter  Medications  . flunisolide (NASALIDE) 25 MCG/ACT (0.025%) SOLN    Sig: PLACE 2 SPRAYS INTO THE NOSE 2 TIMES DAILY    Refill:  11  . DALIRESP 500 MCG TABS tablet    Sig: TAKE 1 TABLET (500 MCG TOTAL) BY MOUTH DAILY.    Refill:  11  . DISCONTD: morphine (MSIR) 15 MG tablet    Sig: Take 1 tablet (15 mg total) by mouth every 4 (four) hours as needed for severe pain.    Dispense:  30 tablet    Refill:  0  . DISCONTD: morphine (MS CONTIN) 30 MG 12 hr tablet    Sig: Take 1 tablet (30 mg total) by mouth every 12 (twelve) hours.    Dispense:  60 tablet    Refill:  0  . DISCONTD: morphine (MS CONTIN) 30 MG 12 hr tablet    Sig: Take 1 tablet (30 mg total) by mouth every 12 (twelve) hours.    Dispense:  60 tablet    Refill:  0  . DISCONTD: morphine (MSIR) 15 MG tablet    Sig: Take 1 tablet (15 mg total) by mouth every 4 (four) hours as needed for severe pain.    Dispense:  30 tablet    Refill:  0  . morphine (MS CONTIN) 30 MG 12 hr tablet    Sig: Take 1 tablet (30 mg total) by mouth every 12 (twelve) hours.    Dispense:  60 tablet    Refill:  0  . morphine (MSIR) 15 MG tablet    Sig: Take 1 tablet (15 mg total) by mouth every 4 (four) hours as needed for severe pain.    Dispense:  30 tablet    Refill:  0     Follow-up: Return in about 6 months (around 03/24/2017).  [name], MD

## 2016-09-24 NOTE — Telephone Encounter (Signed)
Pt in for appt today. Has form been turned back into you?

## 2016-09-24 NOTE — Telephone Encounter (Signed)
I have not received DMV forms back for this paperwork

## 2016-09-24 NOTE — Telephone Encounter (Signed)
Left message for patient to call me back. 

## 2016-09-25 ENCOUNTER — Ambulatory Visit (HOSPITAL_BASED_OUTPATIENT_CLINIC_OR_DEPARTMENT_OTHER): Payer: Commercial Managed Care - HMO

## 2016-09-25 ENCOUNTER — Other Ambulatory Visit (HOSPITAL_BASED_OUTPATIENT_CLINIC_OR_DEPARTMENT_OTHER): Payer: Commercial Managed Care - HMO

## 2016-09-25 VITALS — BP 104/80 | HR 77 | Temp 98.1°F | Resp 18

## 2016-09-25 DIAGNOSIS — Z5112 Encounter for antineoplastic immunotherapy: Secondary | ICD-10-CM

## 2016-09-25 DIAGNOSIS — C3491 Malignant neoplasm of unspecified part of right bronchus or lung: Secondary | ICD-10-CM | POA: Diagnosis not present

## 2016-09-25 DIAGNOSIS — C7801 Secondary malignant neoplasm of right lung: Secondary | ICD-10-CM | POA: Diagnosis not present

## 2016-09-25 LAB — CBC WITH DIFFERENTIAL/PLATELET
BASO%: 0.3 % (ref 0.0–2.0)
Basophils Absolute: 0 10*3/uL (ref 0.0–0.1)
EOS%: 0.1 % (ref 0.0–7.0)
Eosinophils Absolute: 0 10*3/uL (ref 0.0–0.5)
HCT: 40.3 % (ref 38.4–49.9)
HEMOGLOBIN: 13 g/dL (ref 13.0–17.1)
LYMPH%: 2.3 % — AB (ref 14.0–49.0)
MCH: 27.6 pg (ref 27.2–33.4)
MCHC: 32.3 g/dL (ref 32.0–36.0)
MCV: 85.5 fL (ref 79.3–98.0)
MONO#: 0.5 10*3/uL (ref 0.1–0.9)
MONO%: 3.2 % (ref 0.0–14.0)
NEUT%: 94.1 % — ABNORMAL HIGH (ref 39.0–75.0)
NEUTROS ABS: 15.2 10*3/uL — AB (ref 1.5–6.5)
PLATELETS: 201 10*3/uL (ref 140–400)
RBC: 4.72 10*6/uL (ref 4.20–5.82)
RDW: 16.8 % — ABNORMAL HIGH (ref 11.0–14.6)
WBC: 16.1 10*3/uL — AB (ref 4.0–10.3)
lymph#: 0.4 10*3/uL — ABNORMAL LOW (ref 0.9–3.3)

## 2016-09-25 LAB — COMPREHENSIVE METABOLIC PANEL
ALBUMIN: 3.4 g/dL — AB (ref 3.5–5.0)
ALK PHOS: 127 U/L (ref 40–150)
ALT: 14 U/L (ref 0–55)
ANION GAP: 11 meq/L (ref 3–11)
AST: 19 U/L (ref 5–34)
BILIRUBIN TOTAL: 0.5 mg/dL (ref 0.20–1.20)
BUN: 14.2 mg/dL (ref 7.0–26.0)
CO2: 26 mEq/L (ref 22–29)
Calcium: 10.3 mg/dL (ref 8.4–10.4)
Chloride: 98 mEq/L (ref 98–109)
Creatinine: 0.8 mg/dL (ref 0.7–1.3)
Glucose: 131 mg/dl (ref 70–140)
Potassium: 4.3 mEq/L (ref 3.5–5.1)
Sodium: 135 mEq/L — ABNORMAL LOW (ref 136–145)
TOTAL PROTEIN: 6.9 g/dL (ref 6.4–8.3)

## 2016-09-25 LAB — UA PROTEIN, DIPSTICK - CHCC: Protein, ur: NEGATIVE mg/dL

## 2016-09-25 MED ORDER — DEXAMETHASONE SODIUM PHOSPHATE 10 MG/ML IJ SOLN
10.0000 mg | Freq: Once | INTRAMUSCULAR | Status: AC
  Administered 2016-09-25: 10 mg via INTRAVENOUS

## 2016-09-25 MED ORDER — DIPHENHYDRAMINE HCL 50 MG/ML IJ SOLN
INTRAMUSCULAR | Status: AC
Start: 2016-09-25 — End: 2016-09-25
  Filled 2016-09-25: qty 1

## 2016-09-25 MED ORDER — SODIUM CHLORIDE 0.9 % IV SOLN
10.0000 mg/kg | Freq: Once | INTRAVENOUS | Status: AC
  Administered 2016-09-25: 800 mg via INTRAVENOUS
  Filled 2016-09-25: qty 50

## 2016-09-25 MED ORDER — ACETAMINOPHEN 325 MG PO TABS
650.0000 mg | ORAL_TABLET | Freq: Once | ORAL | Status: AC
  Administered 2016-09-25: 650 mg via ORAL

## 2016-09-25 MED ORDER — DEXAMETHASONE SODIUM PHOSPHATE 10 MG/ML IJ SOLN
INTRAMUSCULAR | Status: AC
  Filled 2016-09-25: qty 1

## 2016-09-25 MED ORDER — DIPHENHYDRAMINE HCL 50 MG/ML IJ SOLN
50.0000 mg | Freq: Once | INTRAMUSCULAR | Status: AC
  Administered 2016-09-25: 50 mg via INTRAVENOUS

## 2016-09-25 MED ORDER — PEGFILGRASTIM 6 MG/0.6ML ~~LOC~~ PSKT
6.0000 mg | PREFILLED_SYRINGE | Freq: Once | SUBCUTANEOUS | Status: AC
  Administered 2016-09-25: 6 mg via SUBCUTANEOUS
  Filled 2016-09-25: qty 0.6

## 2016-09-25 MED ORDER — DOCETAXEL CHEMO INJECTION 160 MG/16ML
75.0000 mg/m2 | Freq: Once | INTRAVENOUS | Status: AC
  Administered 2016-09-25: 150 mg via INTRAVENOUS
  Filled 2016-09-25: qty 15

## 2016-09-25 MED ORDER — ACETAMINOPHEN 325 MG PO TABS
ORAL_TABLET | ORAL | Status: AC
  Filled 2016-09-25: qty 2

## 2016-09-25 MED ORDER — SODIUM CHLORIDE 0.9 % IV SOLN
Freq: Once | INTRAVENOUS | Status: AC
  Administered 2016-09-25: 13:00:00 via INTRAVENOUS

## 2016-09-25 NOTE — Patient Instructions (Signed)
[facility] Discharge Instructions for Patients Receiving Chemotherapy  Today you received the following chemotherapy agents Cyramza & Taxotere  To help prevent nausea and vomiting after your treatment, we encourage you to take your nausea medication as directed. If you develop nausea and vomiting that is not controlled by your nausea medication, call the clinic.   BELOW ARE SYMPTOMS THAT SHOULD BE REPORTED IMMEDIATELY:  *FEVER GREATER THAN 100.5 F  *CHILLS WITH OR WITHOUT FEVER  NAUSEA AND VOMITING THAT IS NOT CONTROLLED WITH YOUR NAUSEA MEDICATION  *UNUSUAL SHORTNESS OF BREATH  *UNUSUAL BRUISING OR BLEEDING  TENDERNESS IN MOUTH AND THROAT WITH OR WITHOUT PRESENCE OF ULCERS  *URINARY PROBLEMS  *BOWEL PROBLEMS  UNUSUAL RASH Items with * indicate a potential emergency and should be followed up as soon as possible.  Feel free to call the clinic you have any questions or concerns. The clinic phone number is [phone].  Please show the [facility] at check-in to the Emergency Department and triage nurse.   Ramucirumab injection What is this medicine? RAMUCIRUMAB (ra mue SIR ue mab) is a monoclonal antibody. It is used to treat stomach cancer, colorectal cancer, or lung cancer. This medicine may be used for other purposes; ask your health care provider or pharmacist if you have questions. What should I tell my health care provider before I take this medicine? They need to know if you have any of these conditions: -bleeding disorders -blood clots -heart disease, including heart failure, heart attack, or chest pain (angina) -high blood pressure -infection (especially a virus infection such as chickenpox, cold sores, or herpes) -protein in your urine -recent surgery -stroke -an unusual or allergic reaction to ramucirumab, other medicines, foods, dyes, or preservatives -pregnant or trying to get pregnant -breast-feeding How should I use this  medicine? This medicine is for infusion into a vein. It is given by a health care professional in a hospital or clinic setting. Talk to your pediatrician regarding the use of this medicine in children. Special care may be needed. Overdosage: If you think you have taken too much of this medicine contact a poison control center or emergency room at once. NOTE: This medicine is only for you. Do not share this medicine with others. What if I miss a dose? It is important not to miss your dose. Call your doctor or health care professional if you are unable to keep an appointment. What may interact with this medicine? Interactions have not been studied. This list may not describe all possible interactions. Give your health care provider a list of all the medicines, herbs, non-prescription drugs, or dietary supplements you use. Also tell them if you smoke, drink alcohol, or use illegal drugs. Some items may interact with your medicine. What should I watch for while using this medicine? Your condition will be monitored carefully while you are receiving this medicine. You will need to to check your blood pressure and have your blood and urine tested while you are taking this medicine. Your condition will be monitored carefully while you are receiving this medicine. This medicine may increase your risk to bruise or bleed. Call your doctor or health care professional if you notice any unusual bleeding. This medicine may rarely cause 'gastrointestinal perforation' (holes in the stomach, intestines or colon), a serious side effect requiring surgery to repair. This medicine should be started at least 28 days following major surgery and the site of the surgery should be totally healed. Check with your doctor before  scheduling dental work or surgery while you are receiving this treatment. Talk to your doctor if you have recently had surgery or if you have a wound that has not healed. Do not become pregnant while  taking this medicine or for 3 months after stopping it. Women should inform their doctor if they wish to become pregnant or think they might be pregnant. There is a potential for serious side effects to an unborn child. Talk to your health care professional or pharmacist for more information. What side effects may I notice from receiving this medicine? Side effects that you should report to your doctor or health care professional as soon as possible: -allergic reactions like skin rash, itching or hives, breathing problems, swelling of the face, lips, or tongue -signs of infection - fever or chills, cough, sore throat -chest pain or chest tightness -confusion -dizziness -feeling faint or lightheaded, falls -severe abdominal pain -severe nausea, vomiting -signs and symptoms of bleeding such as bloody or black, tarry stools; red or dark-brown urine; spitting up blood or brown material that looks like coffee grounds; red spots on the skin; unusual bruising or bleeding from the eye, gums, or nose -signs and symptoms of a blood clot such as breathing problems; changes in vision; chest pain; severe, sudden headache; pain, swelling, warmth in the leg; trouble speaking; sudden numbness or weakness of the face, arm or leg -symptoms of a stroke: change in mental awareness, inability to talk or move one side of the body -trouble walking, dizziness, loss of balance or coordination Side effects that usually do not require medical attention (Report these to your doctor or health care professional if they continue or are bothersome.): -cold, clammy skin -constipation -diarrhea -headache -nausea, vomiting -stomach pain -unusually slow heartbeat -unusually weak or tired This list may not describe all possible side effects. Call your doctor for medical advice about side effects. You may report side effects to FDA at 1-800-FDA-1088. Where should I keep my medicine? This drug is given in a hospital or clinic  and will not be stored at home. NOTE: This sheet is a summary. It may not cover all possible information. If you have questions about this medicine, talk to your doctor, pharmacist, or health care provider.    2016, Elsevier/Gold Standard. (2015-01-10 17:37:19)    Docetaxel injection What is this medicine? DOCETAXEL (doe se TAX el) is a chemotherapy drug. It targets fast dividing cells, like cancer cells, and causes these cells to die. This medicine is used to treat many types of cancers like breast cancer, certain stomach cancers, head and neck cancer, lung cancer, and prostate cancer. This medicine may be used for other purposes; ask your health care provider or pharmacist if you have questions. What should I tell my health care provider before I take this medicine? They need to know if you have any of these conditions: -infection (especially a virus infection such as chickenpox, cold sores, or herpes) -liver disease -low blood counts, like low white cell, platelet, or red cell counts -an unusual or allergic reaction to docetaxel, polysorbate 80, other chemotherapy agents, other medicines, foods, dyes, or preservatives -pregnant or trying to get pregnant -breast-feeding How should I use this medicine? This drug is given as an infusion into a vein. It is administered in a hospital or clinic by a specially trained health care professional. Talk to your pediatrician regarding the use of this medicine in children. Special care may be needed. Overdosage: If you think you have taken too much  of this medicine contact a poison control center or emergency room at once. NOTE: This medicine is only for you. Do not share this medicine with others. What if I miss a dose? It is important not to miss your dose. Call your doctor or health care professional if you are unable to keep an appointment. What may interact with this medicine? -cyclosporine -erythromycin -ketoconazole -medicines to increase  blood counts like filgrastim, pegfilgrastim, sargramostim -vaccines Talk to your doctor or health care professional before taking any of these medicines: -acetaminophen -aspirin -ibuprofen -ketoprofen -naproxen This list may not describe all possible interactions. Give your health care provider a list of all the medicines, herbs, non-prescription drugs, or dietary supplements you use. Also tell them if you smoke, drink alcohol, or use illegal drugs. Some items may interact with your medicine. What should I watch for while using this medicine? Your condition will be monitored carefully while you are receiving this medicine. You will need important blood work done while you are taking this medicine. This drug may make you feel generally unwell. This is not uncommon, as chemotherapy can affect healthy cells as well as cancer cells. Report any side effects. Continue your course of treatment even though you feel ill unless your doctor tells you to stop. In some cases, you may be given additional medicines to help with side effects. Follow all directions for their use. Call your doctor or health care professional for advice if you get a fever, chills or sore throat, or other symptoms of a cold or flu. Do not treat yourself. This drug decreases your body's ability to fight infections. Try to avoid being around people who are sick. This medicine may increase your risk to bruise or bleed. Call your doctor or health care professional if you notice any unusual bleeding. This medicine may contain alcohol in the product. You may get drowsy or dizzy. Do not drive, use machinery, or do anything that needs mental alertness until you know how this medicine affects you. Do not stand or sit up quickly, especially if you are an older patient. This reduces the risk of dizzy or fainting spells. Avoid alcoholic drinks. Do not become pregnant while taking this medicine. Women should inform their doctor if they wish to  become pregnant or think they might be pregnant. There is a potential for serious side effects to an unborn child. Talk to your health care professional or pharmacist for more information. Do not breast-feed an infant while taking this medicine. What side effects may I notice from receiving this medicine? Side effects that you should report to your doctor or health care professional as soon as possible: -allergic reactions like skin rash, itching or hives, swelling of the face, lips, or tongue -low blood counts - This drug may decrease the number of white blood cells, red blood cells and platelets. You may be at increased risk for infections and bleeding. -signs of infection - fever or chills, cough, sore throat, pain or difficulty passing urine -signs of decreased platelets or bleeding - bruising, pinpoint red spots on the skin, black, tarry stools, nosebleeds -signs of decreased red blood cells - unusually weak or tired, fainting spells, lightheadedness -breathing problems -fast or irregular heartbeat -low blood pressure -mouth sores -nausea and vomiting -pain, swelling, redness or irritation at the injection site -pain, tingling, numbness in the hands or feet -swelling of the ankle, feet, hands -weight gain Side effects that usually do not require medical attention (report to your prescriber or health  care professional if they continue or are bothersome): -bone pain -complete hair loss including hair on your head, underarms, pubic hair, eyebrows, and eyelashes -diarrhea -excessive tearing -changes in the color of fingernails -loosening of the fingernails -nausea -muscle pain -red flush to skin -sweating -weak or tired This list may not describe all possible side effects. Call your doctor for medical advice about side effects. You may report side effects to FDA at 1-800-FDA-1088. Where should I keep my medicine? This drug is given in a hospital or clinic and will not be stored at  home. NOTE: This sheet is a summary. It may not cover all possible information. If you have questions about this medicine, talk to your doctor, pharmacist, or health care provider.    2016, Elsevier/Gold Standard. (2014-11-28 16:04:57)

## 2016-09-26 ENCOUNTER — Telehealth: Payer: Self-pay | Admitting: Neurology

## 2016-09-26 NOTE — Telephone Encounter (Signed)
It was likely the indocin causing the GI upset.  Can he continue the topamax and see if that alone helps?

## 2016-09-26 NOTE — Telephone Encounter (Signed)
Patient states he stopped taking Indomethicin and Topax because it was making him very sick to stomach, light-headed, and he couldn't drive. Patient wants to know if there is anther medication he can try. Please advise.  Patient also wants Dr. Posey Pronto aware he had CT done in ED last month.

## 2016-09-26 NOTE — Telephone Encounter (Signed)
[phone] patient phone number  Patient wants to talk to someone about headache

## 2016-09-27 ENCOUNTER — Telehealth: Payer: Self-pay | Admitting: Internal Medicine

## 2016-09-27 ENCOUNTER — Telehealth: Payer: Self-pay | Admitting: *Deleted

## 2016-09-27 ENCOUNTER — Ambulatory Visit (INDEPENDENT_AMBULATORY_CARE_PROVIDER_SITE_OTHER): Payer: Commercial Managed Care - HMO | Admitting: *Deleted

## 2016-09-27 DIAGNOSIS — R519 Headache, unspecified: Secondary | ICD-10-CM

## 2016-09-27 DIAGNOSIS — R51 Headache: Secondary | ICD-10-CM

## 2016-09-27 MED ORDER — METOCLOPRAMIDE HCL 5 MG/ML IJ SOLN: 10.0000 mg | Freq: Once | INTRAVENOUS | Status: DC

## 2016-09-27 MED ORDER — KETOROLAC TROMETHAMINE 60 MG/2ML IM SOLN
60.0000 mg | Freq: Once | INTRAMUSCULAR | Status: AC
  Administered 2016-09-27: 30 mg via INTRAMUSCULAR

## 2016-09-27 NOTE — Telephone Encounter (Signed)
Patient states he called last week looking for DOT forms that he dropped off two weeks ago.  Patient was told to call back this week to see if forms had been found.  Please follow up in regard.

## 2016-09-27 NOTE — Telephone Encounter (Signed)
Follow up call placed to pt. Pt denied concerns at this time.

## 2016-09-27 NOTE — Telephone Encounter (Signed)
-----   Message from Arty Baumgartner, RN sent at 09/25/2016  4:07 PM EDT ----- Regarding: First time chemo/Mohamed.  First time cyramza/taxotere.  [name]. Tolerated well.

## 2016-09-27 NOTE — Telephone Encounter (Signed)
Patient is going to continue the topamax alone and will come in for the toradol/reglan injection today.

## 2016-09-27 NOTE — Telephone Encounter (Signed)
See next note

## 2016-09-27 NOTE — Telephone Encounter (Signed)
Call patient

## 2016-09-27 NOTE — Telephone Encounter (Signed)
I have no knowledge of DOT forms for this patient

## 2016-09-30 ENCOUNTER — Telehealth: Payer: Self-pay | Admitting: Emergency Medicine

## 2016-09-30 ENCOUNTER — Other Ambulatory Visit: Payer: Self-pay | Admitting: Internal Medicine

## 2016-09-30 MED ORDER — AMOXICILLIN 875 MG PO TABS: 875.0000 mg | ORAL_TABLET | Freq: Two times a day (BID) | ORAL | 0 refills | Status: AC

## 2016-09-30 NOTE — Telephone Encounter (Signed)
Pt called and wants to know if you can write him a prescription for a sore throat. Offered pt an appt but he refused due to having chemo recently and not wanting to be in the germs. Please advise thanks.

## 2016-09-30 NOTE — Telephone Encounter (Signed)
Pls advise. Pt has a sore throat.

## 2016-09-30 NOTE — Telephone Encounter (Signed)
RX sent

## 2016-10-01 NOTE — Telephone Encounter (Signed)
Pt advised.

## 2016-10-02 ENCOUNTER — Other Ambulatory Visit: Payer: Commercial Managed Care - HMO

## 2016-10-04 ENCOUNTER — Other Ambulatory Visit (HOSPITAL_BASED_OUTPATIENT_CLINIC_OR_DEPARTMENT_OTHER): Payer: Commercial Managed Care - HMO

## 2016-10-04 DIAGNOSIS — Z5112 Encounter for antineoplastic immunotherapy: Secondary | ICD-10-CM

## 2016-10-04 DIAGNOSIS — C3491 Malignant neoplasm of unspecified part of right bronchus or lung: Secondary | ICD-10-CM | POA: Diagnosis not present

## 2016-10-04 LAB — CBC WITH DIFFERENTIAL/PLATELET
BASO%: 0.7 % (ref 0.0–2.0)
BASOS ABS: 0.3 10*3/uL — AB (ref 0.0–0.1)
EOS ABS: 0.1 10*3/uL (ref 0.0–0.5)
EOS%: 0.2 % (ref 0.0–7.0)
HCT: 39.1 % (ref 38.4–49.9)
HEMOGLOBIN: 12.7 g/dL — AB (ref 13.0–17.1)
LYMPH%: 4.4 % — ABNORMAL LOW (ref 14.0–49.0)
MCH: 28.2 pg (ref 27.2–33.4)
MCHC: 32.5 g/dL (ref 32.0–36.0)
MCV: 86.9 fL (ref 79.3–98.0)
MONO#: 1.5 10*3/uL — ABNORMAL HIGH (ref 0.1–0.9)
MONO%: 3.7 % (ref 0.0–14.0)
NEUT#: 36.2 10*3/uL — ABNORMAL HIGH (ref 1.5–6.5)
NEUT%: 91 % — AB (ref 39.0–75.0)
NRBC: 0 % (ref 0–0)
PLATELETS: 162 10*3/uL (ref 140–400)
RBC: 4.5 10*6/uL (ref 4.20–5.82)
RDW: 16.3 % — ABNORMAL HIGH (ref 11.0–14.6)
WBC: 39.7 10*3/uL — ABNORMAL HIGH (ref 4.0–10.3)
lymph#: 1.8 10*3/uL (ref 0.9–3.3)

## 2016-10-04 LAB — COMPREHENSIVE METABOLIC PANEL
ALBUMIN: 3.2 g/dL — AB (ref 3.5–5.0)
ALK PHOS: 163 U/L — AB (ref 40–150)
ALT: 27 U/L (ref 0–55)
ANION GAP: 9 meq/L (ref 3–11)
AST: 34 U/L (ref 5–34)
BILIRUBIN TOTAL: 0.3 mg/dL (ref 0.20–1.20)
BUN: 9.7 mg/dL (ref 7.0–26.0)
CO2: 27 mEq/L (ref 22–29)
Calcium: 9.5 mg/dL (ref 8.4–10.4)
Chloride: 97 mEq/L — ABNORMAL LOW (ref 98–109)
Creatinine: 0.8 mg/dL (ref 0.7–1.3)
GLUCOSE: 96 mg/dL (ref 70–140)
POTASSIUM: 4.3 meq/L (ref 3.5–5.1)
SODIUM: 133 meq/L — AB (ref 136–145)
TOTAL PROTEIN: 6.3 g/dL — AB (ref 6.4–8.3)

## 2016-10-04 LAB — UA PROTEIN, DIPSTICK - CHCC: Protein, ur: NEGATIVE mg/dL

## 2016-10-08 NOTE — Telephone Encounter (Signed)
Contacted pt to inquire about whether or not pt has faxed new forms to [place]. Pt stated that his wife is faxing them today.   Forms are due on the 28th of November and advised that I can have them done pretty quickly when I receive them and will call him as soon as I have them.

## 2016-10-09 ENCOUNTER — Other Ambulatory Visit (HOSPITAL_BASED_OUTPATIENT_CLINIC_OR_DEPARTMENT_OTHER): Payer: Commercial Managed Care - HMO

## 2016-10-09 ENCOUNTER — Telehealth: Payer: Self-pay | Admitting: Internal Medicine

## 2016-10-09 DIAGNOSIS — C3491 Malignant neoplasm of unspecified part of right bronchus or lung: Secondary | ICD-10-CM

## 2016-10-09 DIAGNOSIS — Z79899 Other long term (current) drug therapy: Secondary | ICD-10-CM | POA: Diagnosis not present

## 2016-10-09 DIAGNOSIS — Z5112 Encounter for antineoplastic immunotherapy: Secondary | ICD-10-CM

## 2016-10-09 LAB — TSH: TSH: 3.198 m(IU)/L (ref 0.320–4.118)

## 2016-10-09 LAB — CBC WITH DIFFERENTIAL/PLATELET
BASO%: 0.1 % (ref 0.0–2.0)
Basophils Absolute: 0 10*3/uL (ref 0.0–0.1)
EOS ABS: 0 10*3/uL (ref 0.0–0.5)
EOS%: 0.1 % (ref 0.0–7.0)
HCT: 40.6 % (ref 38.4–49.9)
HGB: 12.8 g/dL — ABNORMAL LOW (ref 13.0–17.1)
LYMPH%: 3.8 % — AB (ref 14.0–49.0)
MCH: 27.9 pg (ref 27.2–33.4)
MCHC: 31.5 g/dL — AB (ref 32.0–36.0)
MCV: 88.5 fL (ref 79.3–98.0)
MONO#: 0.7 10*3/uL (ref 0.1–0.9)
MONO%: 2.6 % (ref 0.0–14.0)
NEUT%: 93.4 % — AB (ref 39.0–75.0)
NEUTROS ABS: 25.2 10*3/uL — AB (ref 1.5–6.5)
Platelets: 96 10*3/uL — ABNORMAL LOW (ref 140–400)
RBC: 4.59 10*6/uL (ref 4.20–5.82)
RDW: 17 % — ABNORMAL HIGH (ref 11.0–14.6)
WBC: 27 10*3/uL — AB (ref 4.0–10.3)
lymph#: 1 10*3/uL (ref 0.9–3.3)

## 2016-10-09 LAB — COMPREHENSIVE METABOLIC PANEL
ALT: 33 U/L (ref 0–55)
ANION GAP: 12 meq/L — AB (ref 3–11)
AST: 32 U/L (ref 5–34)
Albumin: 3.4 g/dL — ABNORMAL LOW (ref 3.5–5.0)
Alkaline Phosphatase: 153 U/L — ABNORMAL HIGH (ref 40–150)
BUN: 7.9 mg/dL (ref 7.0–26.0)
CHLORIDE: 97 meq/L — AB (ref 98–109)
CO2: 28 meq/L (ref 22–29)
Calcium: 9.9 mg/dL (ref 8.4–10.4)
Creatinine: 0.9 mg/dL (ref 0.7–1.3)
GLUCOSE: 112 mg/dL (ref 70–140)
POTASSIUM: 4.5 meq/L (ref 3.5–5.1)
SODIUM: 137 meq/L (ref 136–145)
Total Bilirubin: 0.42 mg/dL (ref 0.20–1.20)
Total Protein: 6.4 g/dL (ref 6.4–8.3)

## 2016-10-09 MED ORDER — ALBUTEROL SULFATE (2.5 MG/3ML) 0.083% IN NEBU: INHALATION_SOLUTION | RESPIRATORY_TRACT | 2 refills | Status: DC

## 2016-10-09 NOTE — Telephone Encounter (Signed)
Pt aware that Rx has been sent to [facility]; nothing further needed at this time.

## 2016-10-09 NOTE — Telephone Encounter (Signed)
LVM for pt to call back as soon as possible.    RE: DOT not received via fax.

## 2016-10-11 NOTE — Telephone Encounter (Signed)
Form received and signed by PCP on 10/10/2016. Completed and faxed on 10/11/2016. Pt notified that forms have been faxed. Originals are up front for patient. Copy sent to scan. Copy given to [facility] to charge.

## 2016-10-15 ENCOUNTER — Other Ambulatory Visit (HOSPITAL_BASED_OUTPATIENT_CLINIC_OR_DEPARTMENT_OTHER): Payer: Commercial Managed Care - HMO

## 2016-10-15 ENCOUNTER — Ambulatory Visit (HOSPITAL_BASED_OUTPATIENT_CLINIC_OR_DEPARTMENT_OTHER): Payer: Commercial Managed Care - HMO

## 2016-10-15 ENCOUNTER — Encounter: Payer: Self-pay | Admitting: Internal Medicine

## 2016-10-15 ENCOUNTER — Ambulatory Visit (HOSPITAL_BASED_OUTPATIENT_CLINIC_OR_DEPARTMENT_OTHER): Payer: Commercial Managed Care - HMO | Admitting: Internal Medicine

## 2016-10-15 VITALS — BP 141/67 | HR 92 | Temp 97.9°F | Resp 16 | Ht 66.0 in | Wt 176.8 lb

## 2016-10-15 DIAGNOSIS — C3491 Malignant neoplasm of unspecified part of right bronchus or lung: Secondary | ICD-10-CM

## 2016-10-15 DIAGNOSIS — Z5111 Encounter for antineoplastic chemotherapy: Secondary | ICD-10-CM | POA: Diagnosis not present

## 2016-10-15 DIAGNOSIS — Z5189 Encounter for other specified aftercare: Secondary | ICD-10-CM | POA: Diagnosis not present

## 2016-10-15 DIAGNOSIS — C7801 Secondary malignant neoplasm of right lung: Secondary | ICD-10-CM

## 2016-10-15 DIAGNOSIS — Z5112 Encounter for antineoplastic immunotherapy: Secondary | ICD-10-CM | POA: Diagnosis not present

## 2016-10-15 DIAGNOSIS — J310 Chronic rhinitis: Secondary | ICD-10-CM

## 2016-10-15 LAB — CBC WITH DIFFERENTIAL/PLATELET
BASO%: 1.1 % (ref 0.0–2.0)
Basophils Absolute: 0.2 10*3/uL — ABNORMAL HIGH (ref 0.0–0.1)
EOS%: 0.5 % (ref 0.0–7.0)
Eosinophils Absolute: 0.1 10*3/uL (ref 0.0–0.5)
HCT: 41 % (ref 38.4–49.9)
HEMOGLOBIN: 13 g/dL (ref 13.0–17.1)
LYMPH%: 8.1 % — ABNORMAL LOW (ref 14.0–49.0)
MCH: 27 pg — ABNORMAL LOW (ref 27.2–33.4)
MCHC: 31.7 g/dL — ABNORMAL LOW (ref 32.0–36.0)
MCV: 85.2 fL (ref 79.3–98.0)
MONO#: 1 10*3/uL — ABNORMAL HIGH (ref 0.1–0.9)
MONO%: 6.3 % (ref 0.0–14.0)
NEUT%: 84 % — ABNORMAL HIGH (ref 39.0–75.0)
NEUTROS ABS: 13.9 10*3/uL — AB (ref 1.5–6.5)
Platelets: 128 10*3/uL — ABNORMAL LOW (ref 140–400)
RBC: 4.81 10*6/uL (ref 4.20–5.82)
RDW: 17.3 % — AB (ref 11.0–14.6)
WBC: 16.6 10*3/uL — AB (ref 4.0–10.3)
lymph#: 1.3 10*3/uL (ref 0.9–3.3)

## 2016-10-15 LAB — COMPREHENSIVE METABOLIC PANEL
ALBUMIN: 3.4 g/dL — AB (ref 3.5–5.0)
ALK PHOS: 121 U/L (ref 40–150)
ALT: 22 U/L (ref 0–55)
AST: 26 U/L (ref 5–34)
Anion Gap: 10 mEq/L (ref 3–11)
BILIRUBIN TOTAL: 0.54 mg/dL (ref 0.20–1.20)
BUN: 7.6 mg/dL (ref 7.0–26.0)
CO2: 29 mEq/L (ref 22–29)
Calcium: 9.8 mg/dL (ref 8.4–10.4)
Chloride: 99 mEq/L (ref 98–109)
Creatinine: 0.8 mg/dL (ref 0.7–1.3)
EGFR: >90 mL/min/{1.73_m2} (ref >90)
GLUCOSE: 95 mg/dL (ref 70–140)
POTASSIUM: 3.9 meq/L (ref 3.5–5.1)
SODIUM: 138 meq/L (ref 136–145)
TOTAL PROTEIN: 6.4 g/dL (ref 6.4–8.3)

## 2016-10-15 MED ORDER — DOCETAXEL CHEMO INJECTION 160 MG/16ML
75.0000 mg/m2 | Freq: Once | INTRAVENOUS | Status: AC
  Administered 2016-10-15: 150 mg via INTRAVENOUS
  Filled 2016-10-15: qty 15

## 2016-10-15 MED ORDER — DEXAMETHASONE SODIUM PHOSPHATE 10 MG/ML IJ SOLN
INTRAMUSCULAR | Status: AC
  Filled 2016-10-15: qty 1

## 2016-10-15 MED ORDER — SODIUM CHLORIDE 0.9 % IV SOLN
Freq: Once | INTRAVENOUS | Status: AC
  Administered 2016-10-15: 13:00:00 via INTRAVENOUS

## 2016-10-15 MED ORDER — SODIUM CHLORIDE 0.9 % IV SOLN
10.0000 mg/kg | Freq: Once | INTRAVENOUS | Status: AC
  Administered 2016-10-15: 800 mg via INTRAVENOUS
  Filled 2016-10-15: qty 50

## 2016-10-15 MED ORDER — DIPHENHYDRAMINE HCL 50 MG/ML IJ SOLN
INTRAMUSCULAR | Status: AC
  Filled 2016-10-15: qty 1

## 2016-10-15 MED ORDER — PEGFILGRASTIM 6 MG/0.6ML ~~LOC~~ PSKT
6.0000 mg | PREFILLED_SYRINGE | Freq: Once | SUBCUTANEOUS | Status: AC
  Administered 2016-10-15: 6 mg via SUBCUTANEOUS
  Filled 2016-10-15: qty 0.6

## 2016-10-15 MED ORDER — DIPHENHYDRAMINE HCL 50 MG/ML IJ SOLN
50.0000 mg | Freq: Once | INTRAMUSCULAR | Status: AC
  Administered 2016-10-15: 50 mg via INTRAVENOUS

## 2016-10-15 MED ORDER — DEXAMETHASONE SODIUM PHOSPHATE 10 MG/ML IJ SOLN
10.0000 mg | Freq: Once | INTRAMUSCULAR | Status: AC
  Administered 2016-10-15: 10 mg via INTRAVENOUS

## 2016-10-15 MED ORDER — ACETAMINOPHEN 325 MG PO TABS
ORAL_TABLET | ORAL | Status: AC
  Filled 2016-10-15: qty 2

## 2016-10-15 MED ORDER — ACETAMINOPHEN 325 MG PO TABS
650.0000 mg | ORAL_TABLET | Freq: Once | ORAL | Status: AC
  Administered 2016-10-15: 650 mg via ORAL

## 2016-10-15 NOTE — Progress Notes (Signed)
[name] [phone]   [phone]  OFFICE PROGRESS NOTE  [name], MD [address] [facility] [address] [place] [address]  DIAGNOSIS: Metastatic non-small cell lung cancer, poorly differentiated carcinoma initially diagnosed as Stage IIIA (T3, N2, M0) non-small cell lung cancer consistent with squamous cell carcinoma involving the left suprahilar mass with mediastinal lymphadenopathy diagnosed in November of 2014. The patient has recurrence in February 2017. PDL 1 expression 0%.  PRIOR THERAPY:  1) Concurrent chemoradiation with weekly carboplatin for AUC of 2 and paclitaxel 45 mg/M2, status post 7 cycles, last dose was given 12/20/2013. First dose on 11/01/2013. 2) Consolidation chemotherapy with carboplatin for AUC of 5 and paclitaxel 175 mg/M2 every 3 weeks with Neulasta support. First cycle on 02/08/2014. Status post 3 cycles and carboplatin was discontinued secondary to allergic reaction. 3) Abraxane 100 MG/M2 on days 1 and 8 every 3 weeks. Status post 3 cycles. Last dose was given 04/16/2016 discontinued secondary to disease progression. 4) Nivolumab 240 MG IV every 2 weeks. First dose 05/27/2016. Status post one cycle. Discontinued secondary to disease progression.   CURRENT THERAPY: Systemic chemotherapy with docetaxel 75 MG/M2 and Cyramza 10 MG/KG every 3 weeks, first dose 09/24/2016. Status post one cycle.  CHEMOTHERAPY INTENT: Palliative  CURRENT # OF CHEMOTHERAPY CYCLES: 2  CURRENT ANTIEMETICS: Zofran, dexamethasone and Compazine  CURRENT SMOKING STATUS: Former smoker  ORAL CHEMOTHERAPY AND CONSENT: None  CURRENT BISPHOSPHONATES USE: None  PAIN MANAGEMENT: 0/10  NARCOTICS INDUCED CONSTIPATION: None.  LIVING WILL AND CODE STATUS: Full code.   INTERVAL HISTORY: Kaymen Adrian Able 60 y.o. male returns to the clinic today for follow-up visit. The patient is feeling fine today was no specific complaints except for  occasional nasal congestion and mild epistaxis. He was started recently on systemic chemotherapy with docetaxel and Cyramza and tolerating his treatment well. He denied having any significant weight loss or night sweats. He has no nausea or vomiting. He denied having any significant fever or chills. His headaches had improved since starting cycle #2 of his chemotherapy.  MEDICAL HISTORY: Past Medical History:  Diagnosis Date  . Anxiety   . CAD (coronary artery disease)    Left Main 30% stenosis, LAD 20 - 30 % stenosis, first and second diagonal branchesat 40 - 50%  stenosis with small arteries, circumflex had 30% stenosis in the large obtuse marginal, RCA at 70 - 80%  stenosis [not felt to be occlusive after evaluation with flow wire], distal 50 - 60% stenosis - [name][  . Cancer ([place])    lung  . Chronic insomnia   . COPD (chronic obstructive pulmonary disease) (HCC)    Dr. Baird Lyons  . 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 DDD (degenerative disc disease)   . Depression   . Diabetes mellitus without complication ([place]) 11/30/2692   no medications now 07/24/2016  . GERD (gastroesophageal reflux disease)   . Gout   . History of cardiovascular stress test    Myoview 7/16:  Diaphragmatic attenuation, no ischemia, EF 56%; Low Risk  . History of radiation therapy 11/10/13- 12/29/13   left lung 6600 cGy in 33 sessions  . Hx of colonoscopy   . Hyperlipidemia   . Hypertension    dr Percival Spanish  . Itching due to drug 03/19/2016  . Lung cancer ([place]) 10/04/13   LUL squamous cell lung cancer  . Nausea with vomiting 03/26/2016  . OSA (obstructive sleep apnea)    NPSG 09/10/10-  AHI 11.3/hr  . PVD (peripheral vascular disease) (HCC)    PTA/Stent right common iliac    ALLERGIES:  is allergic to carboplatin.  MEDICATIONS:  Current Outpatient Prescriptions  Medication Sig Dispense Refill  . ADVAIR DISKUS 500-50 MCG/DOSE AEPB 1 PUFF THEN RINSE MOUTH, TWICE DAILY MAINTENANCE 60 each 2  .  albuterol (PROVENTIL) (2.5 MG/3ML) 0.083% nebulizer solution INHALE 1 VIAL IN NEBULIZER EVERY 4 HOURS AS NEEDED FOR WHEEZING OR SHORTNESS OF BREATH 300 mL 2  . ARIPiprazole (ABILIFY) 2 MG tablet Take 2 mg by mouth at bedtime.   4  . Armodafinil 250 MG tablet Take 250 mg by mouth every morning.  5  . aspirin 81 MG EC tablet TAKE 1 TABLET BY MOUTH EVERY DAY 30 tablet 5  . atorvastatin (LIPITOR) 20 MG tablet TAKE 1 TABLET EVERY DAY (Patient taking differently: TAKE 20 MG  EVERY DAY IN THE EVENING) 90 tablet 3  . clonazePAM (KLONOPIN) 1 MG tablet Take 2 mg by mouth at bedtime as needed for anxiety. Reported on 01/16/2016    . DALIRESP 500 MCG TABS tablet TAKE 1 TABLET (500 MCG TOTAL) BY MOUTH DAILY.  11  . dexamethasone (DECADRON) 4 MG tablet 2 tablets by mouth twice a day the day before, day of and day after the chemotherapy every 3 weeks 40 tablet 1  . dextromethorphan-guaiFENesin (MUCINEX DM) 30-600 MG 12hr tablet Take 1 tablet by mouth 2 (two) times daily as needed for cough (congestion).    [name] esomeprazole (NEXIUM) 40 MG capsule TAKE ONE CAPSULE BY MOUTH EVERY DAY (Patient taking differently: TAKE 40 MG  BY MOUTH TWICE DAILY.) 90 capsule 3  . feeding supplement, ENSURE ENLIVE, (ENSURE ENLIVE) LIQD Take 237 mLs by mouth 2 (two) times daily between meals. 237 mL 12  . flunisolide (NASALIDE) 25 MCG/ACT (0.025%) SOLN PLACE 2 SPRAYS INTO THE NOSE 2 TIMES DAILY  11  . FLUoxetine (PROZAC) 40 MG capsule Take 40 mg by mouth every morning.    . Gabapentin Enacarbil ER (HORIZANT) 300 MG TBCR Take 1 capsule by mouth daily. 30 tablet 2  . indomethacin (INDOCIN) 25 MG capsule Take 1 tablet three times daily with meals x 3 days, then increase to 2 tablet three times daily x 3 days. 90 capsule 1  . KLOR-CON M20 20 MEQ tablet TAKE 1 TABLET BY MOUTH EVERY MORNING 90 tablet 1  . Methylnaltrexone Bromide (RELISTOR) 150 MG TABS Take 3 tablets by mouth every morning. 90 tablet 11  . metoprolol succinate (TOPROL-XL) 25 MG  24 hr tablet Take 25 mg by mouth daily.    . mirtazapine (REMERON) 45 MG tablet Take 1 tablet (45 mg total) by mouth at bedtime.    [name] morphine (MS CONTIN) 30 MG 12 hr tablet Take 1 tablet (30 mg total) by mouth every 12 (twelve) hours. 60 tablet 0  . morphine (MSIR) 15 MG tablet Take 1 tablet (15 mg total) by mouth every 4 (four) hours as needed for severe pain. 30 tablet 0  . nitroGLYCERIN (NITROSTAT) 0.4 MG SL tablet Place 1 tablet (0.4 mg total) under the tongue every 5 (five) minutes as needed for chest pain. 25 tablet 3  . ondansetron (ZOFRAN) 8 MG tablet TAKE 1 TABLET (8 MG TOTAL) BY MOUTH EVERY 8 (EIGHT) HOURS AS NEEDED FOR NAUSEA OR VOMITING. 20 tablet 0  . OXYGEN Inhale 2 L into the lungs daily.     . predniSONE (DELTASONE) 10 MG tablet 4 x 2 days, 3 x 2  days, 2 x 2 days, 1 x 2 days then back to maintenance dose 20 tablet 0  . predniSONE (DELTASONE) 5 MG tablet Take 1 tablet (5 mg total) by mouth daily with breakfast. 30 tablet 1  . prochlorperazine (COMPAZINE) 10 MG tablet Take 1 tablet (10 mg total) by mouth every 6 (six) hours as needed for nausea or vomiting. 30 tablet 0  . theophylline (UNIPHYL) 400 MG 24 hr tablet Take 1 tablet (400 mg total) by mouth daily. 30 tablet 5  . Tiotropium Bromide Monohydrate (SPIRIVA RESPIMAT) 1.25 MCG/ACT AERS Inhale 2 puffs into the lungs daily. 4 g 11  . topiramate (TOPAMAX) 25 MG tablet Take 1 tablet (25 mg total) by mouth daily. 30 tablet 3  . VENTOLIN HFA 108 (90 Base) MCG/ACT inhaler INHALE 2 PUFFS INTO THE LUNGS 4 TIMES DAILY AS NEEDED FOR WHEEZING 18 Inhaler 2   Current Facility-Administered Medications  Medication Dose Route Frequency Provider Last Rate Last Dose  . metoCLOPramide (REGLAN) 10 mg in dextrose 5 % 50 mL IVPB  10 mg Intramuscular Once Alda Berthold, DO        SURGICAL HISTORY:  Past Surgical History:  Procedure Laterality Date  . ANGIOPLASTY    . ANTERIOR FUSION CERVICAL SPINE     cervical fusion  7 yrs ago ([facility])  . arm  surgery     left elbow  . BACK SURGERY     lower  . c-spine surgery    . COLON SURGERY     '11 "Diverticulitis"  . COLONOSCOPY WITH PROPOFOL N/A 05/22/2015   Procedure: COLONOSCOPY WITH PROPOFOL;  Surgeon: Inda Castle, MD;  Location: [facility];  Service: Endoscopy;  Laterality: N/A;  . HIP SURGERY     left 'bone graft taken"  . PERIPHERAL VASCULAR CATHETERIZATION N/A 08/10/2015   Procedure: Lower Extremity Angiography;  Surgeon: Lorretta Harp, MD; Distal Ao OK, L-EIA stent OK, R-CIA 100% s/p overlapping 7 mm x 38 mm ICast stents, 50-60% R-CFA      . SHOULDER SURGERY     right  . SPINAL FUSION  03/05/2007   L4-L5  . VIDEO BRONCHOSCOPY WITH ENDOBRONCHIAL NAVIGATION N/A 10/04/2013   Procedure: VIDEO BRONCHOSCOPY WITH ENDOBRONCHIAL NAVIGATION;  Surgeon: Collene Gobble, MD;  Location: [facility];  Service: Thoracic;  Laterality: N/A;    REVIEW OF SYSTEMS:  A comprehensive review of systems was negative except for: Constitutional: positive for fatigue Respiratory: positive for cough and dyspnea on exertion   PHYSICAL EXAMINATION: General appearance: alert, cooperative and no distress Head: Normocephalic, without obvious abnormality, atraumatic Neck: no adenopathy, no JVD, supple, symmetrical, trachea midline and thyroid not enlarged, symmetric, no tenderness/mass/nodules Lymph nodes: Cervical, supraclavicular, and axillary nodes normal. Resp: wheezes bilaterally Back: symmetric, no curvature. ROM normal. No CVA tenderness. Cardio: regular rate and rhythm, S1, S2 normal, no murmur, click, rub or gallop GI: soft, non-tender; bowel sounds normal; no masses,  no organomegaly Extremities: extremities normal, atraumatic, no cyanosis or edema Neurologic: Alert and oriented X 3, normal strength and tone. Normal symmetric reflexes. Normal coordination and gait  ECOG PERFORMANCE STATUS: 1 - Symptomatic but completely ambulatory  Blood pressure (!) 141/67, pulse 92, temperature 97.9 F (36.6  C), temperature source Oral, resp. rate 16, height 5' 6"  (1.676 m), weight 176 lb 12.8 oz (80.2 kg), SpO2 92 %.  LABORATORY DATA: Lab Results  Component Value Date   WBC 16.6 (H) 10/15/2016   HGB 13.0 10/15/2016   HCT 41.0 10/15/2016   MCV  85.2 10/15/2016   PLT 128 (L) 10/15/2016      Chemistry      Component Value Date/Time   NA 137 10/09/2016 1105   K 4.5 10/09/2016 1105   CL 96 09/24/2016 1008   CO2 28 10/09/2016 1105   BUN 7.9 10/09/2016 1105   CREATININE 0.9 10/09/2016 1105      Component Value Date/Time   CALCIUM 9.9 10/09/2016 1105   ALKPHOS 153 (H) 10/09/2016 1105   AST 32 10/09/2016 1105   ALT 33 10/09/2016 1105   BILITOT 0.42 10/09/2016 1105       RADIOGRAPHIC STUDIES: No results found.  ASSESSMENT AND PLAN: This is a very pleasant 60 years old white male with stage IIIA non-small cell lung cancer, squamous cell carcinoma currently undergoing concurrent chemoradiation with weekly carboplatin and paclitaxel status post 7 weeks of treatment. He tolerated his treatment fairly well with no significant adverse effects. This was followed by consolidation chemotherapy with carboplatin and paclitaxel status post 3 cycles, .carboplatin was discontinued after cycle #2 secondary to hypersensitivity reaction. The recent CT scan of the chest progressive enlargement of the right lower lobe lung nodule concerning for pulmonary metastasis or new synchronous tumor. The recent PET scan showed hypermetabolic activity in the 1.9 cm right lower lobe pulmonary nodule suspicious for metastasis. There was also small mediastinal node metastasis. CT-guided core biopsy of the right lower lobe pulmonary nodule showed poorly differentiated carcinoma. PDL 1 expression was 0% The patient was started on treatment with single agent Abraxane status post 3 cycles but unfortunately restaging CT scan of the chest showed evidence for disease progression. He was then started on treatment with  immunotherapy with Nivolumab status post 1 cycle.[name]  Unfortunately the recent CT scan of the chest showed significant disease progression but this is not indicative of his response to the immunotherapy as he received only one cycle. Another CT scan of the chest, abdomen and pelvis performed on 07/24/2016 showed further disease progression. The patient is no take candidate for for the clinical trial with a MET inhibitor at [facility]. The patient is currently undergoing systemic chemotherapy with docetaxel and Cyramza status post 1 cycle. He tolerated the first cycle of his treatment well.  I recommended for him to proceed with cycle #2 today as scheduled. The patient would come back for follow-up visit in 3 weeks with the start of cycle #3. He was advised to call immediately if he has any concerning symptoms in the interval. The patient voices understanding of current disease status and treatment options and is in agreement with the current care plan.  All questions were answered. The patient knows to call the clinic with any problems, questions or concerns. We can certainly see the patient much sooner if necessary.  Disclaimer: This note was dictated with voice recognition software. Similar sounding words can inadvertently be transcribed and may not be corrected upon review.

## 2016-10-15 NOTE — Patient Instructions (Signed)
[facility] Discharge Instructions for Patients Receiving Chemotherapy  Today you received the following chemotherapy agents:  Cyramza and Taxotere.  To help prevent nausea and vomiting after your treatment, we encourage you to take your nausea medication as prescribed.   If you develop nausea and vomiting that is not controlled by your nausea medication, call the clinic.   BELOW ARE SYMPTOMS THAT SHOULD BE REPORTED IMMEDIATELY:  *FEVER GREATER THAN 100.5 F  *CHILLS WITH OR WITHOUT FEVER  NAUSEA AND VOMITING THAT IS NOT CONTROLLED WITH YOUR NAUSEA MEDICATION  *UNUSUAL SHORTNESS OF BREATH  *UNUSUAL BRUISING OR BLEEDING  TENDERNESS IN MOUTH AND THROAT WITH OR WITHOUT PRESENCE OF ULCERS  *URINARY PROBLEMS  *BOWEL PROBLEMS  UNUSUAL RASH Items with * indicate a potential emergency and should be followed up as soon as possible.  Feel free to call the clinic you have any questions or concerns. The clinic phone number is [phone].  Please show the [facility] at check-in to the Emergency Department and triage nurse.

## 2016-10-16 ENCOUNTER — Other Ambulatory Visit: Payer: Self-pay | Admitting: Internal Medicine

## 2016-10-18 DIAGNOSIS — J449 Chronic obstructive pulmonary disease, unspecified: Secondary | ICD-10-CM | POA: Diagnosis not present

## 2016-10-22 ENCOUNTER — Encounter: Payer: Self-pay | Admitting: Nurse Practitioner

## 2016-10-22 ENCOUNTER — Ambulatory Visit (HOSPITAL_COMMUNITY)
Admission: RE | Admit: 2016-10-22 | Discharge: 2016-10-22 | Disposition: A | Discharge status: Home / Self Care | Payer: Commercial Managed Care - HMO | Source: Ambulatory Visit | Attending: Nurse Practitioner | Admitting: Nurse Practitioner

## 2016-10-22 ENCOUNTER — Other Ambulatory Visit (HOSPITAL_BASED_OUTPATIENT_CLINIC_OR_DEPARTMENT_OTHER): Payer: Commercial Managed Care - HMO

## 2016-10-22 ENCOUNTER — Telehealth: Payer: Self-pay | Admitting: Nurse Practitioner

## 2016-10-22 ENCOUNTER — Ambulatory Visit (HOSPITAL_BASED_OUTPATIENT_CLINIC_OR_DEPARTMENT_OTHER): Payer: Commercial Managed Care - HMO | Admitting: Nurse Practitioner

## 2016-10-22 ENCOUNTER — Other Ambulatory Visit: Payer: Self-pay | Admitting: *Deleted

## 2016-10-22 ENCOUNTER — Telehealth: Payer: Self-pay | Admitting: Medical Oncology

## 2016-10-22 VITALS — BP 144/66 | HR 107 | Temp 97.7°F | Resp 27 | Ht 66.0 in | Wt 174.5 lb

## 2016-10-22 DIAGNOSIS — C3492 Malignant neoplasm of unspecified part of left bronchus or lung: Secondary | ICD-10-CM

## 2016-10-22 DIAGNOSIS — J40 Bronchitis, not specified as acute or chronic: Secondary | ICD-10-CM | POA: Diagnosis not present

## 2016-10-22 DIAGNOSIS — R079 Chest pain, unspecified: Secondary | ICD-10-CM | POA: Diagnosis not present

## 2016-10-22 DIAGNOSIS — F419 Anxiety disorder, unspecified: Secondary | ICD-10-CM

## 2016-10-22 DIAGNOSIS — C3491 Malignant neoplasm of unspecified part of right bronchus or lung: Secondary | ICD-10-CM

## 2016-10-22 DIAGNOSIS — E871 Hypo-osmolality and hyponatremia: Secondary | ICD-10-CM | POA: Insufficient documentation

## 2016-10-22 DIAGNOSIS — J449 Chronic obstructive pulmonary disease, unspecified: Secondary | ICD-10-CM | POA: Diagnosis not present

## 2016-10-22 DIAGNOSIS — R0602 Shortness of breath: Secondary | ICD-10-CM | POA: Diagnosis not present

## 2016-10-22 LAB — COMPREHENSIVE METABOLIC PANEL
ALT: 24 U/L (ref 0–55)
AST: 34 U/L (ref 5–34)
Albumin: 3 g/dL — ABNORMAL LOW (ref 3.5–5.0)
Alkaline Phosphatase: 127 U/L (ref 40–150)
Anion Gap: 9 mEq/L (ref 3–11)
BUN: 7.9 mg/dL (ref 7.0–26.0)
CALCIUM: 9.5 mg/dL (ref 8.4–10.4)
CHLORIDE: 94 meq/L — AB (ref 98–109)
CO2: 29 meq/L (ref 22–29)
Creatinine: 0.7 mg/dL (ref 0.7–1.3)
EGFR: >90 mL/min/{1.73_m2} (ref >90)
Glucose: 109 mg/dl (ref 70–140)
POTASSIUM: 4.1 meq/L (ref 3.5–5.1)
SODIUM: 132 meq/L — AB (ref 136–145)
Total Bilirubin: 0.5 mg/dL (ref 0.20–1.20)
Total Protein: 5.9 g/dL — ABNORMAL LOW (ref 6.4–8.3)

## 2016-10-22 LAB — CBC WITH DIFFERENTIAL/PLATELET
BASO%: 0.3 % (ref 0.0–2.0)
Basophils Absolute: 0.1 10*3/uL (ref 0.0–0.1)
EOS%: 0.8 % (ref 0.0–7.0)
Eosinophils Absolute: 0.1 10*3/uL (ref 0.0–0.5)
HCT: 36.4 % — ABNORMAL LOW (ref 38.4–49.9)
HGB: 12.1 g/dL — ABNORMAL LOW (ref 13.0–17.1)
LYMPH%: 4.1 % — AB (ref 14.0–49.0)
MCH: 28.5 pg (ref 27.2–33.4)
MCHC: 33.2 g/dL (ref 32.0–36.0)
MCV: 85.6 fL (ref 79.3–98.0)
MONO#: 1.1 10*3/uL — ABNORMAL HIGH (ref 0.1–0.9)
MONO%: 6.2 % (ref 0.0–14.0)
NEUT#: 15.2 10*3/uL — ABNORMAL HIGH (ref 1.5–6.5)
NEUT%: 88.6 % — AB (ref 39.0–75.0)
Platelets: 99 10*3/uL — ABNORMAL LOW (ref 140–400)
RBC: 4.25 10*6/uL (ref 4.20–5.82)
RDW: 17.3 % — ABNORMAL HIGH (ref 11.0–14.6)
WBC: 17.2 10*3/uL — AB (ref 4.0–10.3)
lymph#: 0.7 10*3/uL — ABNORMAL LOW (ref 0.9–3.3)
nRBC: 0 % (ref 0–0)

## 2016-10-22 LAB — UA PROTEIN, DIPSTICK - CHCC: PROTEIN: NEGATIVE mg/dL

## 2016-10-22 MED ORDER — FLUCONAZOLE 100 MG PO TABS
100.0000 mg | ORAL_TABLET | Freq: Every day | ORAL | 0 refills | Status: DC
Start: 2016-10-22 — End: 2016-12-06

## 2016-10-22 MED ORDER — PROCHLORPERAZINE MALEATE 10 MG PO TABS
10.0000 mg | ORAL_TABLET | Freq: Four times a day (QID) | ORAL | 2 refills | Status: DC | PRN
Start: 2016-10-22 — End: 2016-12-04

## 2016-10-22 MED ORDER — LEVOFLOXACIN 500 MG PO TABS: 500.0000 mg | ORAL_TABLET | Freq: Every day | ORAL | 0 refills | Status: DC

## 2016-10-22 MED ORDER — LORAZEPAM 0.5 MG PO TABS: 0.5000 mg | ORAL_TABLET | Freq: Two times a day (BID) | ORAL | 0 refills | Status: DC | PRN

## 2016-10-22 NOTE — Assessment & Plan Note (Signed)
Patient has a history of chronic anxiety issues.  He has a Engineer, water and takes the Abilify in the Prozac as previously directed.  Also, patient takes Klonopin at night.  He is requesting something to take for anxiety during the day.  He states that he is becoming increasingly anxious-since his A neighbor had throat cancer and recently passed away.  He states that the next 4 neighbor was supposed to outlive the patient himself; but recently passed away.  Long discussion with the patient and his family member regarding the use of Ativan on a very occasional use only.  He was advised to withhold taking the Ativan if he is taking the Klonopin or any other narcotic medications.

## 2016-10-22 NOTE — Assessment & Plan Note (Signed)
Patient's sodium was decreased down to 132 today.  Patient states he's had decreased appetite and less oral intake recently.  He has no nausea, vomiting, or diarrhea.  Patient's sodium was low; the patient does not appear dehydrated today.  Patient was encouraged to push fluids at home; and to return to the [facility] if he is unable to manage adequate oral intake at home.  We'll continue to monitor closely.

## 2016-10-22 NOTE — Progress Notes (Signed)
SYMPTOM MANAGEMENT CLINIC    Chief Complaint: Bronchitis  HPI:  Ernest [name] 60 y.o. male diagnosed with lung cancer.  Patient is currently undergoing Taxotere/Cyramza chemotherapy regimen.    No history exists.    Review of Systems  Constitutional: Positive for malaise/fatigue.  Respiratory: Positive for cough, shortness of breath and wheezing.   All other systems reviewed and are negative.   Past Medical History:  Diagnosis Date  . Anxiety   . CAD (coronary artery disease)    Left Main 30% stenosis, LAD 20 - 30 % stenosis, first and second diagonal branchesat 40 - 50%  stenosis with small arteries, circumflex had 30% stenosis in the large obtuse marginal, RCA at 70 - 80%  stenosis [not felt to be occlusive after evaluation with flow wire], distal 50 - 60% stenosis - [name][  . Cancer ([place])    lung  . Chronic insomnia   . COPD (chronic obstructive pulmonary disease) (HCC)    Dr. Baird Lyons  . COPD with asthma ([place]) 09/08/2007  . DDD (degenerative disc disease)   . Depression   . Diabetes mellitus without complication ([place]) 02/21/5187   no medications now 07/24/2016  . GERD (gastroesophageal reflux disease)   . Gout   . History of cardiovascular stress test    Myoview 7/16:  Diaphragmatic attenuation, no ischemia, EF 56%; Low Risk  . History of radiation therapy 11/10/13- 12/29/13   left lung 6600 cGy in 33 sessions  . Hx of colonoscopy   . Hyperlipidemia   . Hypertension    dr Percival Spanish  . Itching due to drug 03/19/2016  . Lung cancer ([place]) 10/04/13   LUL squamous cell lung cancer  . Nausea with vomiting 03/26/2016  . OSA (obstructive sleep apnea)    NPSG 09/10/10- AHI 11.3/hr  . PVD (peripheral vascular disease) ([place])    PTA/Stent right common iliac    Past Surgical History:  Procedure Laterality Date  . ANGIOPLASTY    . ANTERIOR FUSION CERVICAL SPINE     cervical fusion  7 yrs ago ([facility])  . arm surgery     left elbow  . BACK SURGERY     lower  . c-spine surgery    . COLON SURGERY     '11 "Diverticulitis"  . COLONOSCOPY WITH PROPOFOL N/A 05/22/2015   Procedure: COLONOSCOPY WITH PROPOFOL;  Surgeon: Inda Castle, MD;  Location: [facility];  Service: Endoscopy;  Laterality: N/A;  . HIP SURGERY     left 'bone graft taken"  . PERIPHERAL VASCULAR CATHETERIZATION N/A 08/10/2015   Procedure: Lower Extremity Angiography;  Surgeon: Lorretta Harp, MD; Distal Ao OK, L-EIA stent OK, R-CIA 100% s/p overlapping 7 mm x 38 mm ICast stents, 50-60% R-CFA      . SHOULDER SURGERY     right  . SPINAL FUSION  03/05/2007   L4-L5  . VIDEO BRONCHOSCOPY WITH ENDOBRONCHIAL NAVIGATION N/A 10/04/2013   Procedure: VIDEO BRONCHOSCOPY WITH ENDOBRONCHIAL NAVIGATION;  Surgeon: Collene Gobble, MD;  Location: [facility];  Service: Thoracic;  Laterality: N/A;    has Hyperlipidemia with target LDL less than 70; GOUT; Depression with anxiety; Obstructive sleep apnea; Essential hypertension; Peripheral vascular disease ([place]); Bronchitis; COPD mixed type ([place]); GERD; Preventative health care; Non-small cell carcinoma of lung, right ([place]); Chronic respiratory failure with hypoxia ([place]); Chronic respiratory failure with hypercapnia ([place]); Chronic pain syndrome; Diabetes mellitus without complication ([place]); Coronary artery disease involving native coronary artery of native heart without angina pectoris; 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    is allergic to carboplatin.    Medication List       Accurate as of 10/22/16  5:06 PM. Always use your most recent med list.          ADVAIR DISKUS 500-50 MCG/DOSE Aepb Generic drug:  Fluticasone-Salmeterol 1 PUFF  THEN RINSE MOUTH, TWICE DAILY MAINTENANCE   ARIPiprazole 2 MG tablet Commonly known as:  ABILIFY Take 2 mg by mouth at bedtime.   Armodafinil 250 MG tablet Take 250 mg by mouth every morning.   aspirin 81 MG EC tablet TAKE 1 TABLET BY MOUTH EVERY DAY   atorvastatin 20 MG tablet Commonly known as:  LIPITOR TAKE 1 TABLET EVERY DAY   clonazePAM 1 MG tablet Commonly known as:  KLONOPIN Take 2 mg by mouth at bedtime as needed for anxiety. Reported on 01/16/2016   dexamethasone 4 MG tablet Commonly known as:  DECADRON 2 tablets by mouth twice a day the day before, day of and day after the chemotherapy every 3 weeks   dextromethorphan-guaiFENesin 30-600 MG 12hr tablet Commonly known as:  MUCINEX DM Take 1 tablet by mouth 2 (two) times daily as needed for cough (congestion).   esomeprazole 40 MG capsule Commonly known as:  NEXIUM TAKE ONE CAPSULE BY MOUTH EVERY DAY   feeding supplement (ENSURE ENLIVE) Liqd Take 237 mLs by mouth 2 (two) times daily between meals.   fluconazole 100 MG tablet Commonly known as:  DIFLUCAN Take 1 tablet (100 mg total) by mouth daily.   FLUoxetine 40 MG capsule Commonly known as:  PROZAC Take 40 mg by mouth every morning.   Gabapentin Enacarbil ER 300 MG Tbcr Commonly known as:  HORIZANT Take 1 capsule by mouth daily.   indomethacin 25 MG capsule Commonly known as:  INDOCIN Take 1 tablet three times daily with meals x 3 days, then increase to 2 tablet three times daily x 3 days.   KLOR-CON M20 20 MEQ tablet Generic drug:  potassium chloride SA TAKE 1 TABLET BY MOUTH EVERY MORNING   levofloxacin 500 MG tablet Commonly known as:  LEVAQUIN Take 1 tablet (500 mg total) by mouth daily.   LORazepam 0.5 MG tablet Commonly known as:  ATIVAN Take 1 tablet (0.5 mg total) by mouth 2 (two) times daily as needed for anxiety. Do not take when taking Klonopin.   Methylnaltrexone Bromide 150 MG Tabs Commonly known as:  RELISTOR Take 3 tablets by  mouth every morning.   metoprolol succinate 25 MG 24 hr tablet Commonly known as:  TOPROL-XL Take 25 mg by mouth daily.   mirtazapine 45 MG tablet Commonly known as:  REMERON Take 1 tablet (45 mg total) by mouth at bedtime.   morphine 30 MG 12 hr tablet Commonly known as:  MS CONTIN Take 1 tablet (30 mg total) by mouth every 12 (twelve) hours.   morphine 15 MG tablet Commonly known as:  MSIR Take 1 tablet (15 mg total) by mouth every 4 (four) hours as needed for severe pain.   nitroGLYCERIN 0.4 MG SL tablet Commonly known as:  NITROSTAT Place 1 tablet (0.4 mg total) under the tongue every 5 (five) minutes as needed for chest pain.   ondansetron 8 MG tablet Commonly known as:  ZOFRAN TAKE  1 TABLET (8 MG TOTAL) BY MOUTH EVERY 8 (EIGHT) HOURS AS NEEDED FOR NAUSEA OR VOMITING.   OXYGEN Inhale 2 L into the lungs daily.   predniSONE 5 MG tablet Commonly known as:  DELTASONE Take 1 tablet (5 mg total) by mouth daily with breakfast.   prochlorperazine 10 MG tablet Commonly known as:  COMPAZINE Take 1 tablet (10 mg total) by mouth every 6 (six) hours as needed for nausea or vomiting.   theophylline 400 MG 24 hr tablet Commonly known as:  UNIPHYL Take 1 tablet (400 mg total) by mouth daily.   Tiotropium Bromide Monohydrate 1.25 MCG/ACT Aers Commonly known as:  SPIRIVA RESPIMAT Inhale 2 puffs into the lungs daily.   VENTOLIN HFA 108 (90 Base) MCG/ACT inhaler Generic drug:  albuterol INHALE 2 PUFFS INTO THE LUNGS 4 TIMES DAILY AS NEEDED FOR WHEEZING   albuterol (2.5 MG/3ML) 0.083% nebulizer solution Commonly known as:  PROVENTIL INHALE 1 VIAL IN NEBULIZER EVERY 4 HOURS AS NEEDED FOR WHEEZING OR SHORTNESS OF BREATH        PHYSICAL EXAMINATION  Oncology Vitals 10/22/2016 10/22/2016  Height - 168 cm  Weight - 79.153 kg  Weight (lbs) - 174 lbs 8 oz  BMI (kg/m2) - 28.17 kg/m2  Temp - 97.7  Pulse - 107  Resp - 27  SpO2 96 88  BSA (m2) - 1.92 m2   BP Readings from  Last 2 Encounters:  10/22/16 (!) 144/66  10/15/16 (!) 141/67    Physical Exam  Constitutional: He is oriented to person, place, and time and well-developed, well-nourished, and in no distress.  HENT:  Head: Normocephalic and atraumatic.  Mouth/Throat: Oropharynx is clear and moist.  Eyes: Conjunctivae and EOM are normal. Pupils are equal, round, and reactive to light. Right eye exhibits no discharge. Left eye exhibits no discharge. No scleral icterus.  Neck: Normal range of motion. Neck supple. No JVD present. No tracheal deviation present. No thyromegaly present.  Cardiovascular: Normal rate, regular rhythm, normal heart sounds and intact distal pulses.   Pulmonary/Chest: Effort normal. No respiratory distress. He has wheezes. He has rales. He exhibits no tenderness.  Abdominal: Soft. Bowel sounds are normal. He exhibits no distension and no mass. There is no tenderness. There is no rebound and no guarding.  Musculoskeletal: Normal range of motion. He exhibits no edema, tenderness or deformity.  Lymphadenopathy:    He has no cervical adenopathy.  Neurological: He is alert and oriented to person, place, and time. Gait normal.  Skin: Skin is warm and dry. No rash noted. No erythema. No pallor.  Psychiatric: Affect normal.  Nursing note and vitals reviewed.   LABORATORY DATA:. Appointment on 10/22/2016  Component Date Value Ref Range Status  . WBC 10/22/2016 17.2* 4.0 - 10.3 10e3/uL Final  . NEUT# 10/22/2016 15.2* 1.5 - 6.5 10e3/uL Final  . HGB 10/22/2016 12.1* 13.0 - 17.1 g/dL Final  . HCT 10/22/2016 36.4* 38.4 - 49.9 % Final  . Platelets 10/22/2016 99* 140 - 400 10e3/uL Final  . MCV 10/22/2016 85.6  79.3 - 98.0 fL Final  . MCH 10/22/2016 28.5  27.2 - 33.4 pg Final  . MCHC 10/22/2016 33.2  32.0 - 36.0 g/dL Final  . RBC 10/22/2016 4.25  4.20 - 5.82 10e6/uL Final  . RDW 10/22/2016 17.3* 11.0 - 14.6 % Final  . lymph# 10/22/2016 0.7* 0.9 - 3.3 10e3/uL Final  . MONO# 10/22/2016 1.1*  0.1 - 0.9 10e3/uL Final  . Eosinophils Absolute 10/22/2016 0.1  0.0 -  0.5 10e3/uL Final  . Basophils Absolute 10/22/2016 0.1  0.0 - 0.1 10e3/uL Final  . NEUT% 10/22/2016 88.6* 39.0 - 75.0 % Final  . LYMPH% 10/22/2016 4.1* 14.0 - 49.0 % Final  . MONO% 10/22/2016 6.2  0.0 - 14.0 % Final  . EOS% 10/22/2016 0.8  0.0 - 7.0 % Final  . BASO% 10/22/2016 0.3  0.0 - 2.0 % Final  . nRBC 10/22/2016 0  0 - 0 % Final  . Sodium 10/22/2016 132* 136 - 145 mEq/L Final  . Potassium 10/22/2016 4.1  3.5 - 5.1 mEq/L Final  . Chloride 10/22/2016 94* 98 - 109 mEq/L Final  . CO2 10/22/2016 29  22 - 29 mEq/L Final  . Glucose 10/22/2016 109  70 - 140 mg/dl Final  . BUN 10/22/2016 7.9  7.0 - 26.0 mg/dL Final  . Creatinine 10/22/2016 0.7  0.7 - 1.3 mg/dL Final  . Total Bilirubin 10/22/2016 0.50  0.20 - 1.20 mg/dL Final  . Alkaline Phosphatase 10/22/2016 127  40 - 150 U/L Final  . AST 10/22/2016 34  5 - 34 U/L Final  . ALT 10/22/2016 24  0 - 55 U/L Final  . Total Protein 10/22/2016 5.9* 6.4 - 8.3 g/dL Final  . Albumin 10/22/2016 3.0* 3.5 - 5.0 g/dL Final  . Calcium 10/22/2016 9.5  8.4 - 10.4 mg/dL Final  . Anion Gap 10/22/2016 9  3 - 11 mEq/L Final  . EGFR 10/22/2016 >90  >90 ml/min/1.73 m2 Final  . Protein, ur 10/22/2016 Negative  Negative- <30 mg/dL Final    RADIOGRAPHIC STUDIES: Dg Chest 2 View  Result Date: 10/22/2016 CLINICAL DATA:  Increasing shortness of breath and RIGHT chest pain for 1 day. History of lung cancer in COPD. EXAM: CHEST  2 VIEW COMPARISON:  Chest radiograph September 12, 2016 and CT chest July 24, 2016 FINDINGS: Lobular conformity of RIGHT mid and superior mediastinal corresponding to known lymphadenopathy. Similar RIGHT lower lobe pulmonary mass. Cardiac silhouette is upper limits of normal in size. Similar LEFT apical pleural thickening and scarring. Mildly increased lung volumes with blunting of the RIGHT costophrenic angle compatible with pleural thickening. Similar scarring RIGHT mid  lung zone. No pneumothorax. ACDF. Soft tissue planes are nonsuspicious. IMPRESSION: Borderline cardiomegaly and COPD.  No acute pulmonary process. Similar radiographic appearance of mediastinal lymphadenopathy and RIGHT lower lobe lung mass. Electronically Signed   By: Elon Alas M.D.   On: 10/22/2016 14:07    ASSESSMENT/PLAN:    Non-small cell carcinoma of lung, right ([place]) Patient received cycle 2 of her dose Taxotere/Cyramza chemotherapy regimen on 10/15/2016.  See further notes for details of today's visit.  Patient is scheduled.  Return on 11/05/2016 for labs, visit, next cycle of chemotherapy.  Hyponatremia Patient's sodium was decreased down to 132 today.  Patient states he's had decreased appetite and less oral intake recently.  He has no nausea, vomiting, or diarrhea.  Patient's sodium was low; the patient does not appear dehydrated today.  Patient was encouraged to push fluids at home; and to return to the [facility] if he is unable to manage adequate oral intake at home.  We'll continue to monitor closely.  Bronchitis Patient has been diagnosed with lung cancer; also has a history of severe COPD as well.  He typically wears oxygen at 2 L via nasal cannula on an as-needed basis; and states that he's been using the oxygen on a more regular basis recently.  He also takes theophylline oral tablets daily as directed.  He has multiple other inhalers.  He uses on a regular basis; as well as his albuterol rescue inhaler.  He uses a nebulizer at home approximately 6 times per day as well.  Patient states that he typically takes prednisone 5 mg a day for his breathing issues.  He reports to the [facility] today with increased congested cough and shortness of breath.  He denies any recent fevers or chills.  He also denies any cold symptoms.  Exam today reveals decreased breath sounds bilaterally; with extensive wheezing.  O2 sat on initial check was 88% on 2 L nasal  cannula.  Recheck of patient's vital signs reveal that O2 sat is increased to 96% on room air.  Chest x-ray obtained today revealed no acute findings.  Reviewed all findings and details with [name]; and will treat patient with Levaquin antibiotics.  He was encouraged to continue all of his respiratory medications and breathing treatments as previously directed as well.  Also, patient was encouraged to increase the prednisone from 5 mg to 10 mg per day for the next 2-3 days; and then to return to the prednisone 5 mg a day.  Also, patient was advised to call/return or go directly to the emergency department for any worsening symptoms whatsoever.  Anxiety Patient has a history of chronic anxiety issues.  He has a Engineer, water and takes the Abilify in the Prozac as previously directed.  Also, patient takes Klonopin at night.  He is requesting something to take for anxiety during the day.  He states that he is becoming increasingly anxious-since his A neighbor had throat cancer and recently passed away.  He states that the next 4 neighbor was supposed to outlive the patient himself; but recently passed away.  Long discussion with the patient and his family member regarding the use of Ativan on a very occasional use only.  He was advised to withhold taking the Ativan if he is taking the Klonopin or any other narcotic medications.   Patient stated understanding of all instructions; and was in agreement with this plan of care. The patient knows to call the clinic with any problems, questions or concerns.   Total time spent with patient was 25 minutes;  with greater than 75 percent of that time spent in face to face counseling regarding patient's symptoms,  and coordination of care and follow up.  Disclaimer:This dictation was prepared with Dragon/digital dictation along with [employer]. Any transcriptional errors that result from this process are unintentional.  Drue Second,  NP 10/22/2016

## 2016-10-22 NOTE — Assessment & Plan Note (Signed)
Patient received cycle 2 of her dose Taxotere/Cyramza chemotherapy regimen on 10/15/2016.  See further notes for details of today's visit.  Patient is scheduled.  Return on 11/05/2016 for labs, visit, next cycle of chemotherapy.

## 2016-10-22 NOTE — Telephone Encounter (Signed)
Painful swallowing and trouble breathing" . He sounds hoarse on the phone, talking in phrases. He said he would like to be seen today and I gave him 1130 appt with [facility] -he said he will be at appt.

## 2016-10-22 NOTE — Telephone Encounter (Signed)
Called patient to review with him that his checks x-ray showed no acute findings.  Patient has diagnosed COPD and mild cardiomegaly.  Patient states that his wife is Berniece Salines of his antibiotic today.  He also states he is going to increase his prednisone to 10 mg for the next few days only.  Patient was encouraged to call/return or go directly to the emergency department for any worsening symptoms whatsoever.  Also, patient was advised to return for IV fluid rehydration.  If he is not able to increase his oral intake.

## 2016-10-22 NOTE — Assessment & Plan Note (Signed)
Patient has been diagnosed with lung cancer; also has a history of severe COPD as well.  He typically wears oxygen at 2 L via nasal cannula on an as-needed basis; and states that he's been using the oxygen on a more regular basis recently.  He also takes theophylline oral tablets daily as directed.  He has multiple other inhalers.  He uses on a regular basis; as well as his albuterol rescue inhaler.  He uses a nebulizer at home approximately 6 times per day as well.  Patient states that he typically takes prednisone 5 mg a day for his breathing issues.  He reports to the [facility] today with increased congested cough and shortness of breath.  He denies any recent fevers or chills.  He also denies any cold symptoms.  Exam today reveals decreased breath sounds bilaterally; with extensive wheezing.  O2 sat on initial check was 88% on 2 L nasal cannula.  Recheck of patient's vital signs reveal that O2 sat is increased to 96% on room air.  Chest x-ray obtained today revealed no acute findings.  Reviewed all findings and details with [name]; and will treat patient with Levaquin antibiotics.  He was encouraged to continue all of his respiratory medications and breathing treatments as previously directed as well.  Also, patient was encouraged to increase the prednisone from 5 mg to 10 mg per day for the next 2-3 days; and then to return to the prednisone 5 mg a day.  Also, patient was advised to call/return or go directly to the emergency department for any worsening symptoms whatsoever.

## 2016-10-24 DIAGNOSIS — J449 Chronic obstructive pulmonary disease, unspecified: Secondary | ICD-10-CM | POA: Diagnosis not present

## 2016-10-24 DIAGNOSIS — J9601 Acute respiratory failure with hypoxia: Secondary | ICD-10-CM | POA: Diagnosis not present

## 2016-10-24 DIAGNOSIS — C342 Malignant neoplasm of middle lobe, bronchus or lung: Secondary | ICD-10-CM | POA: Diagnosis not present

## 2016-10-25 ENCOUNTER — Telehealth: Payer: Self-pay | Admitting: Nurse Practitioner

## 2016-10-25 NOTE — Telephone Encounter (Addendum)
Per Dr Julien Nordmann , I contacted pt and instructed pt to contact [facility] his pulmonologist today or go to ED if symptoms worsen.

## 2016-10-25 NOTE — Telephone Encounter (Signed)
Called patient to check in with him.  He stated that he is continuing to take the Levaquin on a daily basis as directed.  He also has increased his regular prednisone 5 mg up to 10 mg per day for the last few days to see if it made any difference in his breathing.  Patient states that he continues with the wheezing and the coughing as before.  He states that his throat is feeling better and he is able to both drink and eat.  Now with no difficulties.  He also denies any recent fever or chills.  He continues with home O2.  Patient was advised to call/return or go directly to the emergency department for any worsening symptoms whatsoever.

## 2016-10-29 ENCOUNTER — Other Ambulatory Visit (HOSPITAL_BASED_OUTPATIENT_CLINIC_OR_DEPARTMENT_OTHER): Payer: Commercial Managed Care - HMO

## 2016-10-29 DIAGNOSIS — C3491 Malignant neoplasm of unspecified part of right bronchus or lung: Secondary | ICD-10-CM

## 2016-10-29 LAB — COMPREHENSIVE METABOLIC PANEL
ALBUMIN: 3.3 g/dL — AB (ref 3.5–5.0)
ALK PHOS: 134 U/L (ref 40–150)
ALT: 26 U/L (ref 0–55)
ANION GAP: 9 meq/L (ref 3–11)
AST: 30 U/L (ref 5–34)
BUN: 7.8 mg/dL (ref 7.0–26.0)
CALCIUM: 9.6 mg/dL (ref 8.4–10.4)
CHLORIDE: 95 meq/L — AB (ref 98–109)
CO2: 29 mEq/L (ref 22–29)
CREATININE: 0.8 mg/dL (ref 0.7–1.3)
EGFR: >90 mL/min/{1.73_m2} (ref >90)
Glucose: 105 mg/dl (ref 70–140)
POTASSIUM: 4.5 meq/L (ref 3.5–5.1)
Sodium: 134 mEq/L — ABNORMAL LOW (ref 136–145)
Total Bilirubin: 0.46 mg/dL (ref 0.20–1.20)
Total Protein: 6.3 g/dL — ABNORMAL LOW (ref 6.4–8.3)

## 2016-10-29 LAB — CBC WITH DIFFERENTIAL/PLATELET
BASO%: 0.5 % (ref 0.0–2.0)
BASOS ABS: 0.1 10*3/uL (ref 0.0–0.1)
EOS ABS: 0.1 10*3/uL (ref 0.0–0.5)
EOS%: 0.2 % (ref 0.0–7.0)
HEMATOCRIT: 39.4 % (ref 38.4–49.9)
HGB: 12.6 g/dL — ABNORMAL LOW (ref 13.0–17.1)
LYMPH#: 1 10*3/uL (ref 0.9–3.3)
LYMPH%: 3.5 % — ABNORMAL LOW (ref 14.0–49.0)
MCH: 27.5 pg (ref 27.2–33.4)
MCHC: 32 g/dL (ref 32.0–36.0)
MCV: 85.7 fL (ref 79.3–98.0)
MONO#: 0.8 10*3/uL (ref 0.1–0.9)
MONO%: 2.7 % (ref 0.0–14.0)
NEUT#: 27.5 10*3/uL — ABNORMAL HIGH (ref 1.5–6.5)
NEUT%: 93.1 % — AB (ref 39.0–75.0)
PLATELETS: 107 10*3/uL — AB (ref 140–400)
RBC: 4.6 10*6/uL (ref 4.20–5.82)
RDW: 17.9 % — ABNORMAL HIGH (ref 11.0–14.6)
WBC: 29.5 10*3/uL — ABNORMAL HIGH (ref 4.0–10.3)

## 2016-10-31 ENCOUNTER — Encounter: Payer: Self-pay | Admitting: Internal Medicine

## 2016-10-31 ENCOUNTER — Ambulatory Visit (INDEPENDENT_AMBULATORY_CARE_PROVIDER_SITE_OTHER): Payer: Commercial Managed Care - HMO | Admitting: Internal Medicine

## 2016-10-31 VITALS — BP 96/56 | HR 95 | Temp 97.5°F | Resp 20 | Ht 66.0 in | Wt 171.8 lb

## 2016-10-31 DIAGNOSIS — B354 Tinea corporis: Secondary | ICD-10-CM

## 2016-10-31 DIAGNOSIS — I10 Essential (primary) hypertension: Secondary | ICD-10-CM

## 2016-10-31 MED ORDER — CICLOPIROX OLAMINE 0.77 % EX CREA: TOPICAL_CREAM | Freq: Two times a day (BID) | CUTANEOUS | 3 refills | Status: DC

## 2016-10-31 NOTE — Patient Instructions (Signed)
Body Ringworm Introduction Body ringworm is an infection of the skin that often causes a ring-shaped rash. Body ringworm can affect any part of your skin. It can spread easily to others. Body ringworm is also called tinea corporis. What are the causes? This condition is caused by funguses called dermatophytes. The condition develops when these funguses grow out of control on the skin. You can get this condition if you touch a person or animal that has it. You can also get it if you share clothing, bedding, towels, or any other object with an infected person or pet. What increases the risk? This condition is more likely to develop in:  Athletes who often make skin-to-skin contact with other athletes, such as wrestlers.  People who share equipment and mats.  People with a weakened immune system. What are the signs or symptoms? Symptoms of this condition include:  Itchy, raised red spots and bumps.  Red scaly patches.  A ring-shaped rash. The rash may have:  A clear center.  Scales or red bumps at its center.  Redness near its borders.  Dry and scaly skin on or around it. How is this diagnosed? This condition can usually be diagnosed with a skin exam. A skin scraping may be taken from the affected area and examined under a microscope to see if the fungus is present. How is this treated? This condition may be treated with:  An antifungal cream or ointment.  An antifungal shampoo.  Antifungal medicines. These may be prescribed if your ringworm is severe, keeps coming back, or lasts a long time. Follow these instructions at home:  Take over-the-counter and prescription medicines only as told by your health care provider.  If you were given an antifungal cream or ointment:  Use it as told by your health care provider.  Wash the infected area and dry it completely before applying the cream or ointment.  If you were given an antifungal shampoo:  Use it as told by your  health care provider.  Leave the shampoo on your body for 3-5 minutes before rinsing.  While you have a rash:  Wear loose clothing to stop clothes from rubbing and irritating it.  Wash or change your bed sheets every night.  If your pet has the same infection, take your pet to see a Animal nutritionist. How is this prevented?  Practice good hygiene.  Wear sandals or shoes in public places and showers.  Do not share personal items with others.  Avoid touching red patches of skin on other people.  Avoid touching pets that have bald spots.  If you touch an animal that has a bald spot, wash your hands. Contact a health care provider if:  Your rash continues to spread after 7 days of treatment.  Your rash is not gone in 4 weeks.  The area around your rash gets red, warm, tender, and swollen. This information is not intended to replace advice given to you by your health care provider. Make sure you discuss any questions you have with your health care provider. Document Released: 11/08/2000 Document Revised: 04/18/2016 Document Reviewed: 09/07/2015  2017 [employer]

## 2016-10-31 NOTE — Progress Notes (Signed)
Subjective:  Patient ID: Ernest [name], male    DOB: Nov 05, 1956  Age: 60 y.o. MRN: [id number]  CC: Rash   HPI Ernest [name] presents for an itchy rash in his armpits and scrotum that has been treated with diflucan and neosporin but is not improving.  Outpatient Medications Prior to Visit  Medication Sig Dispense Refill  . ADVAIR DISKUS 500-50 MCG/DOSE AEPB 1 PUFF THEN RINSE MOUTH, TWICE DAILY MAINTENANCE 60 each 2  . albuterol (PROVENTIL) (2.5 MG/3ML) 0.083% nebulizer solution INHALE 1 VIAL IN NEBULIZER EVERY 4 HOURS AS NEEDED FOR WHEEZING OR SHORTNESS OF BREATH 300 mL 2  . ARIPiprazole (ABILIFY) 2 MG tablet Take 2 mg by mouth at bedtime.   4  . Armodafinil 250 MG tablet Take 250 mg by mouth every morning.  5  . aspirin 81 MG EC tablet TAKE 1 TABLET BY MOUTH EVERY DAY 30 tablet 5  . atorvastatin (LIPITOR) 20 MG tablet TAKE 1 TABLET EVERY DAY (Patient taking differently: TAKE 20 MG  EVERY DAY IN THE EVENING) 90 tablet 3  . clonazePAM (KLONOPIN) 1 MG tablet Take 2 mg by mouth at bedtime as needed for anxiety. Reported on 01/16/2016    . dexamethasone (DECADRON) 4 MG tablet 2 tablets by mouth twice a day the day before, day of and day after the chemotherapy every 3 weeks 40 tablet 1  . dextromethorphan-guaiFENesin (MUCINEX DM) 30-600 MG 12hr tablet Take 1 tablet by mouth 2 (two) times daily as needed for cough (congestion).    [name] esomeprazole (NEXIUM) 40 MG capsule TAKE ONE CAPSULE BY MOUTH EVERY DAY (Patient taking differently: TAKE 40 MG  BY MOUTH TWICE DAILY.) 90 capsule 3  . feeding supplement, ENSURE ENLIVE, (ENSURE ENLIVE) LIQD Take 237 mLs by mouth 2 (two) times daily between meals. 237 mL 12  . fluconazole (DIFLUCAN) 100 MG tablet Take 1 tablet (100 mg total) by mouth daily. 21 tablet 0  . FLUoxetine (PROZAC) 40 MG capsule Take 40 mg by mouth every morning.    . Gabapentin Enacarbil ER (HORIZANT) 300 MG TBCR Take 1 capsule by mouth daily. 30 tablet 2  . indomethacin  (INDOCIN) 25 MG capsule Take 1 tablet three times daily with meals x 3 days, then increase to 2 tablet three times daily x 3 days. 90 capsule 1  . KLOR-CON M20 20 MEQ tablet TAKE 1 TABLET BY MOUTH EVERY MORNING 90 tablet 1  . LORazepam (ATIVAN) 0.5 MG tablet Take 1 tablet (0.5 mg total) by mouth 2 (two) times daily as needed for anxiety. Do not take when taking Klonopin. 15 tablet 0  . Methylnaltrexone Bromide (RELISTOR) 150 MG TABS Take 3 tablets by mouth every morning. 90 tablet 11  . metoprolol succinate (TOPROL-XL) 25 MG 24 hr tablet Take 25 mg by mouth daily.    . mirtazapine (REMERON) 45 MG tablet Take 1 tablet (45 mg total) by mouth at bedtime.    [name] morphine (MS CONTIN) 30 MG 12 hr tablet Take 1 tablet (30 mg total) by mouth every 12 (twelve) hours. 60 tablet 0  . morphine (MSIR) 15 MG tablet Take 1 tablet (15 mg total) by mouth every 4 (four) hours as needed for severe pain. 30 tablet 0  . nitroGLYCERIN (NITROSTAT) 0.4 MG SL tablet Place 1 tablet (0.4 mg total) under the tongue every 5 (five) minutes as needed for chest pain. 25 tablet 3  . ondansetron (ZOFRAN) 8 MG tablet TAKE 1 TABLET (8 MG TOTAL) BY MOUTH EVERY  8 (EIGHT) HOURS AS NEEDED FOR NAUSEA OR VOMITING. 20 tablet 0  . OXYGEN Inhale 2 L into the lungs daily.     . prochlorperazine (COMPAZINE) 10 MG tablet Take 1 tablet (10 mg total) by mouth every 6 (six) hours as needed for nausea or vomiting. 30 tablet 2  . theophylline (UNIPHYL) 400 MG 24 hr tablet Take 1 tablet (400 mg total) by mouth daily. 30 tablet 5  . Tiotropium Bromide Monohydrate (SPIRIVA RESPIMAT) 1.25 MCG/ACT AERS Inhale 2 puffs into the lungs daily. 4 g 11  . VENTOLIN HFA 108 (90 Base) MCG/ACT inhaler INHALE 2 PUFFS INTO THE LUNGS 4 TIMES DAILY AS NEEDED FOR WHEEZING 18 Inhaler 2  . levofloxacin (LEVAQUIN) 500 MG tablet Take 1 tablet (500 mg total) by mouth daily. 7 tablet 0  . predniSONE (DELTASONE) 5 MG tablet Take 1 tablet (5 mg total) by mouth daily with breakfast.  30 tablet 1  . metoCLOPramide (REGLAN) 10 mg in dextrose 5 % 50 mL IVPB      No facility-administered medications prior to visit.     ROS Review of Systems  Constitutional: Positive for fatigue. Negative for activity change, chills, diaphoresis, fever and unexpected weight change.  HENT: Negative.   Eyes: Negative for visual disturbance.  Respiratory: Positive for shortness of breath and wheezing. Negative for cough, chest tightness and stridor.   Cardiovascular: Negative for chest pain, palpitations and leg swelling.  Gastrointestinal: Negative.  Negative for abdominal pain, constipation, diarrhea, nausea and vomiting.  Endocrine: Negative.   Genitourinary: Negative.  Negative for difficulty urinating.  Musculoskeletal: Negative.  Negative for arthralgias, back pain and myalgias.  Skin: Positive for color change and rash. Negative for pallor and wound.  Hematological: Negative for adenopathy. Does not bruise/bleed easily.  Psychiatric/Behavioral: Negative.     Objective:  BP (!) 96/56 (BP Location: Left Arm, Patient Position: Sitting, Cuff Size: Normal)   Pulse 95   Temp 97.5 F (36.4 C) (Oral)   Resp 20   Ht '5\' 6"'$  (1.676 m)   Wt 171 lb 12 oz (77.9 kg)   SpO2 93%   BMI 27.72 kg/m   BP Readings from Last 3 Encounters:  10/31/16 (!) 96/56  10/22/16 (!) 144/66  10/15/16 (!) 141/67    Wt Readings from Last 3 Encounters:  10/31/16 171 lb 12 oz (77.9 kg)  10/22/16 174 lb 8 oz (79.2 kg)  10/15/16 176 lb 12.8 oz (80.2 kg)    Physical Exam  Constitutional: He is oriented to person, place, and time. No distress.  HENT:  Mouth/Throat: Oropharynx is clear and moist. No oropharyngeal exudate.  Eyes: Conjunctivae are normal. Right eye exhibits no discharge. Left eye exhibits no discharge. No scleral icterus.  Neck: Normal range of motion. Neck supple. No JVD present. No tracheal deviation present. No thyromegaly present.  Cardiovascular: Normal rate, regular rhythm, normal  heart sounds and intact distal pulses.  Exam reveals no gallop and no friction rub.   No murmur heard. Pulmonary/Chest: Effort normal. No accessory muscle usage or stridor. No tachypnea. No respiratory distress. He has no decreased breath sounds. He has no wheezes. He has rhonchi in the right lower field and the left lower field. He has no rales. He exhibits no tenderness.  Abdominal: Soft. Bowel sounds are normal. He exhibits no distension and no mass. There is no tenderness. There is no rebound and no guarding.  Musculoskeletal: Normal range of motion. He exhibits no edema, tenderness or deformity.  Lymphadenopathy:  He has no cervical adenopathy.  Neurological: He is oriented to person, place, and time.  Skin: Skin is warm and dry. Rash noted. He is not diaphoretic. There is erythema. No pallor.  In the intertriginous spaces of the axilla and groin there are confluent areas of erythema that are flat with no raised edges and no papules.  Psychiatric: He has a normal mood and affect. His behavior is normal. Judgment and thought content normal.  Vitals reviewed.   Lab Results  Component Value Date   WBC 29.5 (H) 10/29/2016   HGB 12.6 (L) 10/29/2016   HCT 39.4 10/29/2016   PLT 107 (L) 10/29/2016   GLUCOSE 105 10/29/2016   CHOL 164 10/05/2015   TRIG 174.0 (H) 10/05/2015   HDL 48.70 10/05/2015   LDLDIRECT 71.4 09/08/2012   LDLCALC 80 10/05/2015   ALT 26 10/29/2016   AST 30 10/29/2016   NA 134 (L) 10/29/2016   K 4.5 10/29/2016   CL 96 09/24/2016   CREATININE 0.8 10/29/2016   BUN 7.8 10/29/2016   CO2 29 10/29/2016   TSH 3.198 10/09/2016   PSA 0.64 07/18/2011   INR 1.14 04/21/2016   HGBA1C 5.8 09/24/2016   MICROALBUR 5.7 (H) 10/05/2015    Dg Chest 2 View  Result Date: 10/22/2016 CLINICAL DATA:  Increasing shortness of breath and RIGHT chest pain for 1 day. History of lung cancer in COPD. EXAM: CHEST  2 VIEW COMPARISON:  Chest radiograph September 12, 2016 and CT chest July 24, 2016 FINDINGS: Lobular conformity of RIGHT mid and superior mediastinal corresponding to known lymphadenopathy. Similar RIGHT lower lobe pulmonary mass. Cardiac silhouette is upper limits of normal in size. Similar LEFT apical pleural thickening and scarring. Mildly increased lung volumes with blunting of the RIGHT costophrenic angle compatible with pleural thickening. Similar scarring RIGHT mid lung zone. No pneumothorax. ACDF. Soft tissue planes are nonsuspicious. IMPRESSION: Borderline cardiomegaly and COPD.  No acute pulmonary process. Similar radiographic appearance of mediastinal lymphadenopathy and RIGHT lower lobe lung mass. Electronically Signed   By: Elon Alas M.D.   On: 10/22/2016 14:07    Assessment & Plan:   Prin[place] was seen today for rash.  Diagnoses and all orders for this visit:  Tinea corporis- I will treat this with ciclopirox -     ciclopirox (LOPROX) 0.77 % cream; Apply topically 2 (two) times daily.  Essential hypertension- His blood pressure is adequately well-controlled   I have discontinued Mr. Franson's levofloxacin. I am also having him start on ciclopirox. Additionally, I am having him maintain his clonazePAM, nitroGLYCERIN, mirtazapine, FLUoxetine, atorvastatin, OXYGEN, dextromethorphan-guaiFENesin, Armodafinil, Tiotropium Bromide Monohydrate, ADVAIR DISKUS, esomeprazole, feeding supplement (ENSURE ENLIVE), VENTOLIN HFA, ARIPiprazole, ondansetron, aspirin, Methylnaltrexone Bromide, KLOR-CON M20, theophylline, indomethacin, Gabapentin Enacarbil ER, metoprolol succinate, dexamethasone, morphine, morphine, albuterol, prochlorperazine, LORazepam, fluconazole, topiramate, and predniSONE. We will stop administering metoCLOPramide (REGLAN) 10 mg in dextrose 5 % 50 mL IVPB.  Meds ordered this encounter  Medications  . topiramate (TOPAMAX) 25 MG tablet  . predniSONE (DELTASONE) 10 MG tablet    Sig: TAKE 4 X 2 DAYS, 3 X 2 DAYS, 2 X 2 DAYS, 1 X 2 DAYS THEN BACK TO  MAINTENANCE DOSE    Refill:  0  . ciclopirox (LOPROX) 0.77 % cream    Sig: Apply topically 2 (two) times daily.    Dispense:  90 g    Refill:  3     Follow-up: Return if symptoms worsen or fail to improve.  [name], MD

## 2016-10-31 NOTE — Progress Notes (Signed)
Pre visit review using our clinic review tool, if applicable. No additional management support is needed unless otherwise documented below in the visit note. 

## 2016-11-01 ENCOUNTER — Other Ambulatory Visit: Payer: Self-pay | Admitting: Internal Medicine

## 2016-11-05 ENCOUNTER — Other Ambulatory Visit (HOSPITAL_BASED_OUTPATIENT_CLINIC_OR_DEPARTMENT_OTHER): Payer: Commercial Managed Care - HMO

## 2016-11-05 ENCOUNTER — Ambulatory Visit (HOSPITAL_BASED_OUTPATIENT_CLINIC_OR_DEPARTMENT_OTHER): Payer: Commercial Managed Care - HMO | Admitting: Internal Medicine

## 2016-11-05 ENCOUNTER — Ambulatory Visit: Payer: Commercial Managed Care - HMO

## 2016-11-05 ENCOUNTER — Encounter: Payer: Self-pay | Admitting: Internal Medicine

## 2016-11-05 ENCOUNTER — Telehealth: Payer: Self-pay | Admitting: Internal Medicine

## 2016-11-05 VITALS — BP 151/71 | HR 94 | Temp 97.7°F | Resp 20 | Ht 66.0 in | Wt 170.6 lb

## 2016-11-05 DIAGNOSIS — Z79899 Other long term (current) drug therapy: Secondary | ICD-10-CM | POA: Diagnosis not present

## 2016-11-05 DIAGNOSIS — I10 Essential (primary) hypertension: Secondary | ICD-10-CM

## 2016-11-05 DIAGNOSIS — C3491 Malignant neoplasm of unspecified part of right bronchus or lung: Secondary | ICD-10-CM

## 2016-11-05 DIAGNOSIS — Z5111 Encounter for antineoplastic chemotherapy: Secondary | ICD-10-CM

## 2016-11-05 DIAGNOSIS — J449 Chronic obstructive pulmonary disease, unspecified: Secondary | ICD-10-CM | POA: Diagnosis not present

## 2016-11-05 DIAGNOSIS — J209 Acute bronchitis, unspecified: Secondary | ICD-10-CM | POA: Diagnosis not present

## 2016-11-05 DIAGNOSIS — J9611 Chronic respiratory failure with hypoxia: Secondary | ICD-10-CM

## 2016-11-05 DIAGNOSIS — Z5112 Encounter for antineoplastic immunotherapy: Secondary | ICD-10-CM

## 2016-11-05 HISTORY — DX: Acute bronchitis, unspecified: J20.9

## 2016-11-05 LAB — CBC WITH DIFFERENTIAL/PLATELET
BASO%: 0.5 % (ref 0.0–2.0)
Basophils Absolute: 0.1 10*3/uL (ref 0.0–0.1)
EOS ABS: 0 10*3/uL (ref 0.0–0.5)
EOS%: 0.1 % (ref 0.0–7.0)
HCT: 37.7 % — ABNORMAL LOW (ref 38.4–49.9)
HGB: 12 g/dL — ABNORMAL LOW (ref 13.0–17.1)
LYMPH%: 4.3 % — AB (ref 14.0–49.0)
MCH: 27.6 pg (ref 27.2–33.4)
MCHC: 31.7 g/dL — AB (ref 32.0–36.0)
MCV: 86.9 fL (ref 79.3–98.0)
MONO#: 0.9 10*3/uL (ref 0.1–0.9)
MONO%: 4.6 % (ref 0.0–14.0)
NEUT%: 90.5 % — AB (ref 39.0–75.0)
NEUTROS ABS: 16.9 10*3/uL — AB (ref 1.5–6.5)
PLATELETS: 111 10*3/uL — AB (ref 140–400)
RBC: 4.34 10*6/uL (ref 4.20–5.82)
RDW: 18.9 % — ABNORMAL HIGH (ref 11.0–14.6)
WBC: 18.7 10*3/uL — AB (ref 4.0–10.3)
lymph#: 0.8 10*3/uL — ABNORMAL LOW (ref 0.9–3.3)

## 2016-11-05 LAB — COMPREHENSIVE METABOLIC PANEL
ALT: 21 U/L (ref 0–55)
ANION GAP: 9 meq/L (ref 3–11)
AST: 28 U/L (ref 5–34)
Albumin: 3.3 g/dL — ABNORMAL LOW (ref 3.5–5.0)
Alkaline Phosphatase: 102 U/L (ref 40–150)
BUN: 10.6 mg/dL (ref 7.0–26.0)
CHLORIDE: 97 meq/L — AB (ref 98–109)
CO2: 27 meq/L (ref 22–29)
Calcium: 9.6 mg/dL (ref 8.4–10.4)
Creatinine: 0.7 mg/dL (ref 0.7–1.3)
GLUCOSE: 114 mg/dL (ref 70–140)
POTASSIUM: 4.4 meq/L (ref 3.5–5.1)
SODIUM: 133 meq/L — AB (ref 136–145)
TOTAL PROTEIN: 6.3 g/dL — AB (ref 6.4–8.3)
Total Bilirubin: 0.48 mg/dL (ref 0.20–1.20)

## 2016-11-05 LAB — TSH: TSH: 1.475 m[IU]/L (ref 0.320–4.118)

## 2016-11-05 MED ORDER — LEVOFLOXACIN 500 MG PO TABS: 500.0000 mg | ORAL_TABLET | Freq: Every day | ORAL | 0 refills | Status: DC

## 2016-11-05 NOTE — Telephone Encounter (Signed)
Message sent to chemo scheduler to adjust chemo date, per 11/05/16 los. Patient will get updated schedule via Mychart.  A copy of the AVS report  was given to the patient per 11/05/16 los.

## 2016-11-05 NOTE — Progress Notes (Signed)
[facility] Telephone:([phone]) [phone]   Fax:([phone]  OFFICE PROGRESS NOTE  [name], MD [address] [place] [address]  DIAGNOSIS: Metastatic non-small cell lung cancer, poorly differentiated carcinoma initially diagnosed as stage IIIA (T3, N2, M0) squamous cell carcinoma involving the left suprahilar mass with mediastinal lymphadenopathy diagnosed in November 2017. The patient had recurrence in February 2017. PDL 1 expression is 0%.  PRIOR THERAPY: 1) Concurrent chemoradiation with weekly carboplatin for AUC of 2 and paclitaxel 45 mg/M2, status post 7 cycles, last dose was given 12/20/2013. First dose on 11/01/2013. 2) Consolidation chemotherapy with carboplatin for AUC of 5 and paclitaxel 175 mg/M2 every 3 weeks with Neulasta support. First cycle on 02/08/2014. Status post 3 cycles and carboplatin was discontinued secondary to allergic reaction. 3) Abraxane 100 MG/M2 on days 1 and 8 every 3 weeks. Status post 3 cycles. Last dose was given 04/16/2016 discontinued secondary to disease progression. 4) Nivolumab 240 MG IV every 2 weeks. First dose 05/27/2016. Status post one cycle. Discontinued secondary to disease progression.  CURRENT THERAPY: Systemic chemotherapy with docetaxel 75 MG/M2 and Cyramza 10 MG/KG every 3 weeks. Status post 2 cycles. First dose 09/24/2016.  INTERVAL HISTORY: Ernest [name] 60 y.o. male returns to the clinic today for follow-up visit accompanied by his wife. The patient complaining of cough productive of whitish sputum. He has shortness breath at baseline and increased with exertion and he is currently on home oxygen. Several family members were sick recently. He denied having any fever or chills. He has occasional epistaxis but no hemoptysis. He denied having any other bleeding, bruises or ecchymosis. He lost few pounds since his last visit. He has no nausea, vomiting, diarrhea or constipation. He denied having any  headache or visual changes. He was supposed to start cycle #3 of his chemotherapy today.   MEDICAL HISTORY: Past Medical History:  Diagnosis Date  . Anxiety   . CAD (coronary artery disease)    Left Main 30% stenosis, LAD 20 - 30 % stenosis, first and second diagonal branchesat 40 - 50%  stenosis with small arteries, circumflex had 30% stenosis in the large obtuse marginal, RCA at 70 - 80%  stenosis [not felt to be occlusive after evaluation with flow wire], distal 50 - 60% stenosis - [name][  . Cancer ([facility])    lung  . Chronic insomnia   . COPD (chronic obstructive pulmonary disease) (HCC)    Dr. Baird Lyons  . 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 DDD (degenerative disc disease)   . Depression   . Diabetes mellitus without complication ([facility]) 1/95/0932   no medications now 07/24/2016  . GERD (gastroesophageal reflux disease)   . Gout   . History of cardiovascular stress test    Myoview 7/16:  Diaphragmatic attenuation, no ischemia, EF 56%; Low Risk  . History of radiation therapy 11/10/13- 12/29/13   left lung 6600 cGy in 33 sessions  . Hx of colonoscopy   . Hyperlipidemia   . Hypertension    dr Percival Spanish  . Itching due to drug 03/19/2016  . Lung cancer ([facility]) 10/04/13   LUL squamous cell lung cancer  . Nausea with vomiting 03/26/2016  . OSA (obstructive sleep apnea)    NPSG 09/10/10- AHI 11.3/hr  . PVD (peripheral vascular disease) (HCC)    PTA/Stent right common iliac    ALLERGIES:  is allergic to carboplatin.  MEDICATIONS:  Current Outpatient Prescriptions  Medication Sig Dispense  Refill  . ADVAIR DISKUS 500-50 MCG/DOSE AEPB 1 PUFF THEN RINSE MOUTH, TWICE DAILY MAINTENANCE 60 each 2  . albuterol (PROVENTIL) (2.5 MG/3ML) 0.083% nebulizer solution INHALE 1 VIAL IN NEBULIZER EVERY 4 HOURS AS NEEDED FOR WHEEZING OR SHORTNESS OF BREATH 300 mL 2  . ARIPiprazole (ABILIFY) 2 MG tablet Take 2 mg by mouth at bedtime.   4  . Armodafinil 250 MG tablet Take 250 mg by mouth every  morning.  5  . aspirin 81 MG EC tablet TAKE 1 TABLET BY MOUTH EVERY DAY 30 tablet 5  . atorvastatin (LIPITOR) 20 MG tablet TAKE 1 TABLET EVERY DAY (Patient taking differently: TAKE 20 MG  EVERY DAY IN THE EVENING) 90 tablet 3  . ciclopirox (LOPROX) 0.77 % cream Apply topically 2 (two) times daily. 90 g 3  . clonazePAM (KLONOPIN) 1 MG tablet Take 2 mg by mouth at bedtime as needed for anxiety. Reported on 01/16/2016    . dexamethasone (DECADRON) 4 MG tablet 2 tablets by mouth twice a day the day before, day of and day after the chemotherapy every 3 weeks 40 tablet 1  . dextromethorphan-guaiFENesin (MUCINEX DM) 30-600 MG 12hr tablet Take 1 tablet by mouth 2 (two) times daily as needed for cough (congestion).    [name] esomeprazole (NEXIUM) 40 MG capsule TAKE ONE CAPSULE BY MOUTH EVERY DAY (Patient taking differently: TAKE 40 MG  BY MOUTH TWICE DAILY.) 90 capsule 3  . feeding supplement, ENSURE ENLIVE, (ENSURE ENLIVE) LIQD Take 237 mLs by mouth 2 (two) times daily between meals. 237 mL 12  . fluconazole (DIFLUCAN) 100 MG tablet Take 1 tablet (100 mg total) by mouth daily. 21 tablet 0  . FLUoxetine (PROZAC) 40 MG capsule Take 40 mg by mouth every morning.    . Gabapentin Enacarbil ER (HORIZANT) 300 MG TBCR Take 1 capsule by mouth daily. 30 tablet 2  . indomethacin (INDOCIN) 25 MG capsule Take 1 tablet three times daily with meals x 3 days, then increase to 2 tablet three times daily x 3 days. 90 capsule 1  . KLOR-CON M20 20 MEQ tablet TAKE 1 TABLET BY MOUTH EVERY MORNING 90 tablet 1  . LORazepam (ATIVAN) 0.5 MG tablet Take 1 tablet (0.5 mg total) by mouth 2 (two) times daily as needed for anxiety. Do not take when taking Klonopin. 15 tablet 0  . Methylnaltrexone Bromide (RELISTOR) 150 MG TABS Take 3 tablets by mouth every morning. 90 tablet 11  . metoprolol succinate (TOPROL-XL) 25 MG 24 hr tablet Take 25 mg by mouth daily.    . mirtazapine (REMERON) 45 MG tablet Take 1 tablet (45 mg total) by mouth at  bedtime.    [name] morphine (MS CONTIN) 30 MG 12 hr tablet Take 1 tablet (30 mg total) by mouth every 12 (twelve) hours. 60 tablet 0  . morphine (MSIR) 15 MG tablet Take 1 tablet (15 mg total) by mouth every 4 (four) hours as needed for severe pain. 30 tablet 0  . nitroGLYCERIN (NITROSTAT) 0.4 MG SL tablet Place 1 tablet (0.4 mg total) under the tongue every 5 (five) minutes as needed for chest pain. 25 tablet 3  . ondansetron (ZOFRAN) 8 MG tablet TAKE 1 TABLET (8 MG TOTAL) BY MOUTH EVERY 8 (EIGHT) HOURS AS NEEDED FOR NAUSEA OR VOMITING. 20 tablet 0  . OXYGEN Inhale 2 L into the lungs daily.     . predniSONE (DELTASONE) 10 MG tablet TAKE 4 X 2 DAYS, 3 X 2 DAYS, 2 X 2  DAYS, 1 X 2 DAYS THEN BACK TO MAINTENANCE DOSE  0  . predniSONE (DELTASONE) 10 MG tablet TAKE 4 X 2 DAYS, 3 X 2 DAYS, 2 X 2 DAYS, 1 X 2 DAYS THEN BACK TO MAINTENANCE DOSE 20 tablet 0  . prochlorperazine (COMPAZINE) 10 MG tablet Take 1 tablet (10 mg total) by mouth every 6 (six) hours as needed for nausea or vomiting. 30 tablet 2  . theophylline (UNIPHYL) 400 MG 24 hr tablet Take 1 tablet (400 mg total) by mouth daily. 30 tablet 5  . Tiotropium Bromide Monohydrate (SPIRIVA RESPIMAT) 1.25 MCG/ACT AERS Inhale 2 puffs into the lungs daily. 4 g 11  . topiramate (TOPAMAX) 25 MG tablet     . VENTOLIN HFA 108 (90 Base) MCG/ACT inhaler INHALE 2 PUFFS INTO THE LUNGS 4 TIMES DAILY AS NEEDED FOR WHEEZING 18 Inhaler 2   No current facility-administered medications for this visit.     SURGICAL HISTORY:  Past Surgical History:  Procedure Laterality Date  . ANGIOPLASTY    . ANTERIOR FUSION CERVICAL SPINE     cervical fusion  7 yrs ago ([facility])  . arm surgery     left elbow  . BACK SURGERY     lower  . c-spine surgery    . COLON SURGERY     '11 "Diverticulitis"  . COLONOSCOPY WITH PROPOFOL N/A 05/22/2015   Procedure: COLONOSCOPY WITH PROPOFOL;  Surgeon: Inda Castle, MD;  Location: [facility];  Service: Endoscopy;  Laterality: N/A;  . HIP  SURGERY     left 'bone graft taken"  . PERIPHERAL VASCULAR CATHETERIZATION N/A 08/10/2015   Procedure: Lower Extremity Angiography;  Surgeon: Lorretta Harp, MD; Distal Ao OK, L-EIA stent OK, R-CIA 100% s/p overlapping 7 mm x 38 mm ICast stents, 50-60% R-CFA      . SHOULDER SURGERY     right  . SPINAL FUSION  03/05/2007   L4-L5  . VIDEO BRONCHOSCOPY WITH ENDOBRONCHIAL NAVIGATION N/A 10/04/2013   Procedure: VIDEO BRONCHOSCOPY WITH ENDOBRONCHIAL NAVIGATION;  Surgeon: Collene Gobble, MD;  Location: [facility];  Service: Thoracic;  Laterality: N/A;    REVIEW OF SYSTEMS:  Constitutional: positive for fatigue and weight loss Eyes: negative Ears, nose, mouth, throat, and face: positive for nasal congestion Respiratory: positive for cough, dyspnea on exertion and sputum Cardiovascular: negative Gastrointestinal: negative Genitourinary:negative Integument/breast: negative Hematologic/lymphatic: negative Musculoskeletal:negative Neurological: negative Behavioral/Psych: negative Endocrine: negative Allergic/Immunologic: negative   PHYSICAL EXAMINATION: General appearance: alert, cooperative, fatigued and no distress Head: Normocephalic, without obvious abnormality, atraumatic Neck: no adenopathy, no JVD, supple, symmetrical, trachea midline and thyroid not enlarged, symmetric, no tenderness/mass/nodules Lymph nodes: Cervical, supraclavicular, and axillary nodes normal. Resp: rales bilaterally and rhonchi bilaterally Back: symmetric, no curvature. ROM normal. No CVA tenderness. Cardio: regular rate and rhythm, S1, S2 normal, no murmur, click, rub or gallop GI: soft, non-tender; bowel sounds normal; no masses,  no organomegaly Extremities: extremities normal, atraumatic, no cyanosis or edema Neurologic: Alert and oriented X 3, normal strength and tone. Normal symmetric reflexes. Normal coordination and gait  ECOG PERFORMANCE STATUS: 1 - Symptomatic but completely ambulatory  Blood pressure (!)  151/71, pulse 94, temperature 97.7 F (36.5 C), resp. rate 20, height '5\' 6"'$  (1.676 m), weight 170 lb 9.6 oz (77.4 kg), SpO2 (!) 3 %.  LABORATORY DATA: Lab Results  Component Value Date   WBC 18.7 (H) 11/05/2016   HGB 12.0 (L) 11/05/2016   HCT 37.7 (L) 11/05/2016   MCV 86.9 11/05/2016   PLT  111 (L) 11/05/2016      Chemistry      Component Value Date/Time   NA 134 (L) 10/29/2016 1132   K 4.5 10/29/2016 1132   CL 96 09/24/2016 1008   CO2 29 10/29/2016 1132   BUN 7.8 10/29/2016 1132   CREATININE 0.8 10/29/2016 1132      Component Value Date/Time   CALCIUM 9.6 10/29/2016 1132   ALKPHOS 134 10/29/2016 1132   AST 30 10/29/2016 1132   ALT 26 10/29/2016 1132   BILITOT 0.46 10/29/2016 1132       RADIOGRAPHIC STUDIES: Dg Chest 2 View  Result Date: 10/22/2016 CLINICAL DATA:  Increasing shortness of breath and RIGHT chest pain for 1 day. History of lung cancer in COPD. EXAM: CHEST  2 VIEW COMPARISON:  Chest radiograph September 12, 2016 and CT chest July 24, 2016 FINDINGS: Lobular conformity of RIGHT mid and superior mediastinal corresponding to known lymphadenopathy. Similar RIGHT lower lobe pulmonary mass. Cardiac silhouette is upper limits of normal in size. Similar LEFT apical pleural thickening and scarring. Mildly increased lung volumes with blunting of the RIGHT costophrenic angle compatible with pleural thickening. Similar scarring RIGHT mid lung zone. No pneumothorax. ACDF. Soft tissue planes are nonsuspicious. IMPRESSION: Borderline cardiomegaly and COPD.  No acute pulmonary process. Similar radiographic appearance of mediastinal lymphadenopathy and RIGHT lower lobe lung mass. Electronically Signed   By: Elon Alas M.D.   On: 10/22/2016 14:07    ASSESSMENT AND PLAN: This is a very pleasant 60 years old white male with: 1) recurrent non-small cell lung cancer currently undergoing systemic chemotherapy with docetaxel and Cyramza status post 2 cycles. The patient related the  second cycle of his treatment well with no significant complaints. He is complaining today of productive cough and increasing shortness of breath highly suspicious for acute bronchitis. I recommended for the patient to delay the start of cycle #3 until improvement of his condition. He is expected to start cycle #3 next week. 2) acute bronchitis: We will start the patient on Levaquin 500 mg by mouth daily for the next 7 days. 3) hypertension: I recommended for the patient to monitor his blood pressure closely at home. If it continues to be elevated, he will need to see his primary care physician for treatment. 4) COPD: He will continue on prednisone, albuterol as well as home oxygen. 5) pain management: He is currently on treatment with MS Contin and MSIR. The patient would come back for follow-up visit in 4 weeks for reevaluation with repeat CT scan of the chest, abdomen and pelvis for restaging of his disease. The patient voices understanding of current disease status and treatment options and is in agreement with the current care plan.  All questions were answered. The patient knows to call the clinic with any problems, questions or concerns. We can certainly see the patient much sooner if necessary.  Disclaimer: This note was dictated with voice recognition software. Similar sounding words can inadvertently be transcribed and may not be corrected upon review.

## 2016-11-06 ENCOUNTER — Telehealth: Payer: Self-pay | Admitting: Internal Medicine

## 2016-11-06 ENCOUNTER — Telehealth: Payer: Self-pay | Admitting: *Deleted

## 2016-11-06 MED ORDER — ARFORMOTEROL TARTRATE 15 MCG/2ML IN NEBU: 15.0000 ug | INHALATION_SOLUTION | Freq: Two times a day (BID) | RESPIRATORY_TRACT | 6 refills | Status: DC

## 2016-11-06 NOTE — Telephone Encounter (Signed)
Wife asked me to call patient. He reports worse shortness of breath. Now on chemo. Asking increased amounts of neb solution- using more than 4 treatments/ day.  Plan- add Rx for Brovana neb solution

## 2016-11-06 NOTE — Telephone Encounter (Signed)
Per LOS I have adjusted appts and notified the scheduelr

## 2016-11-07 ENCOUNTER — Telehealth: Payer: Self-pay | Admitting: Internal Medicine

## 2016-11-07 ENCOUNTER — Encounter: Payer: Self-pay | Admitting: Cardiology

## 2016-11-07 NOTE — Telephone Encounter (Signed)
PA has been initiated through cover my meds  ENter a key info [phone] [place] 04-30-1956  Will forward to [place] to follow up on PA.

## 2016-11-07 NOTE — Progress Notes (Signed)
HPI The patient presents for follow of known coronary disease.  He has been treated for squamous cell lung cancer and he has had carboplatin and paclitaxel as well as radiation. At the last visit he was having chest pain.  However he was managed medically.  Since then he's continued to have chemotherapy with recurrence earlier this year of his cancer.  He's lost about 40 pounds. He does have dyspnea with any exertion and is typically on oxygen. If he gets very short of breath he might have some chest tightness and he has to sit down and recover. He looks like he was taken off of Imdur and beta blocker apparently because of low blood pressures. He said the discomfort was not any different when he was taking the Imdur. It is not like his previous angina.   Allergies  Allergen Reactions  . Carboplatin Shortness Of Breath, Swelling and Rash    Swelling of lips, rash on face,eyes and head    Current Outpatient Prescriptions  Medication Sig Dispense Refill  . ADVAIR DISKUS 500-50 MCG/DOSE AEPB 1 PUFF THEN RINSE MOUTH, TWICE DAILY MAINTENANCE 60 each 2  . albuterol (PROVENTIL) (2.5 MG/3ML) 0.083% nebulizer solution INHALE 1 VIAL IN NEBULIZER EVERY 4 HOURS AS NEEDED FOR WHEEZING OR SHORTNESS OF BREATH 300 mL 2  . arformoterol (BROVANA) 15 MCG/2ML NEBU Take 2 mLs (15 mcg total) by nebulization 2 (two) times daily. 120 mL 6  . arformoterol (BROVANA) 15 MCG/2ML NEBU Take 2 mLs (15 mcg total) by nebulization 2 (two) times daily. 64 mL 0  . ARIPiprazole (ABILIFY) 2 MG tablet Take 2 mg by mouth at bedtime.   4  . Armodafinil 250 MG tablet Take 250 mg by mouth every morning.  5  . aspirin 81 MG EC tablet TAKE 1 TABLET BY MOUTH EVERY DAY 30 tablet 5  . atorvastatin (LIPITOR) 20 MG tablet TAKE 1 TABLET EVERY DAY (Patient taking differently: TAKE 20 MG  EVERY DAY IN THE EVENING) 90 tablet 3  . ciclopirox (LOPROX) 0.77 % cream Apply topically 2 (two) times daily. 90 g 3  . clonazePAM (KLONOPIN) 1 MG tablet  Take 2 mg by mouth at bedtime as needed for anxiety. Reported on 01/16/2016    . dexamethasone (DECADRON) 4 MG tablet 2 tablets by mouth twice a day the day before, day of and day after the chemotherapy every 3 weeks 40 tablet 1  . dextromethorphan-guaiFENesin (MUCINEX DM) 30-600 MG 12hr tablet Take 1 tablet by mouth 2 (two) times daily as needed for cough (congestion).    [name] esomeprazole (NEXIUM) 40 MG capsule TAKE ONE CAPSULE BY MOUTH EVERY DAY (Patient taking differently: TAKE 40 MG  BY MOUTH TWICE DAILY.) 90 capsule 3  . feeding supplement, ENSURE ENLIVE, (ENSURE ENLIVE) LIQD Take 237 mLs by mouth 2 (two) times daily between meals. 237 mL 12  . fluconazole (DIFLUCAN) 100 MG tablet Take 1 tablet (100 mg total) by mouth daily. 21 tablet 0  . FLUoxetine (PROZAC) 40 MG capsule Take 40 mg by mouth every morning.    . Gabapentin Enacarbil ER (HORIZANT) 300 MG TBCR Take 1 capsule by mouth daily. 30 tablet 2  . indomethacin (INDOCIN) 25 MG capsule Take 1 tablet three times daily with meals x 3 days, then increase to 2 tablet three times daily x 3 days. 90 capsule 1  . KLOR-CON M20 20 MEQ tablet TAKE 1 TABLET BY MOUTH EVERY MORNING 90 tablet 1  . levofloxacin (LEVAQUIN) 500 MG tablet  Take 1 tablet (500 mg total) by mouth daily. 7 tablet 0  . LORazepam (ATIVAN) 0.5 MG tablet Take 1 tablet (0.5 mg total) by mouth 2 (two) times daily as needed for anxiety. Do not take when taking Klonopin. 15 tablet 0  . Methylnaltrexone Bromide (RELISTOR) 150 MG TABS Take 3 tablets by mouth every morning. 90 tablet 11  . metoprolol succinate (TOPROL-XL) 25 MG 24 hr tablet Take 25 mg by mouth daily.    . mirtazapine (REMERON) 45 MG tablet Take 1 tablet (45 mg total) by mouth at bedtime.    [name] morphine (MS CONTIN) 30 MG 12 hr tablet Take 1 tablet (30 mg total) by mouth every 12 (twelve) hours. 60 tablet 0  . morphine (MSIR) 15 MG tablet Take 15 mg by mouth every 8 (eight) hours as needed.    . nitroGLYCERIN (NITROSTAT) 0.4 MG  SL tablet Place 1 tablet (0.4 mg total) under the tongue every 5 (five) minutes as needed for chest pain. 25 tablet 3  . ondansetron (ZOFRAN) 8 MG tablet TAKE 1 TABLET (8 MG TOTAL) BY MOUTH EVERY 8 (EIGHT) HOURS AS NEEDED FOR NAUSEA OR VOMITING. 20 tablet 0  . OXYGEN Inhale 2 L into the lungs daily.     . predniSONE (DELTASONE) 10 MG tablet TAKE 4 X 2 DAYS, 3 X 2 DAYS, 2 X 2 DAYS, 1 X 2 DAYS THEN BACK TO MAINTENANCE DOSE  0  . predniSONE (DELTASONE) 10 MG tablet TAKE 4 X 2 DAYS, 3 X 2 DAYS, 2 X 2 DAYS, 1 X 2 DAYS THEN BACK TO MAINTENANCE DOSE 20 tablet 0  . predniSONE (DELTASONE) 5 MG tablet Take 5 mg by mouth daily.    . prochlorperazine (COMPAZINE) 10 MG tablet Take 1 tablet (10 mg total) by mouth every 6 (six) hours as needed for nausea or vomiting. 30 tablet 2  . theophylline (UNIPHYL) 400 MG 24 hr tablet Take 1 tablet (400 mg total) by mouth daily. 30 tablet 5  . Tiotropium Bromide Monohydrate (SPIRIVA RESPIMAT) 1.25 MCG/ACT AERS Inhale 2 puffs into the lungs daily. 4 g 11  . topiramate (TOPAMAX) 25 MG tablet     . VENTOLIN HFA 108 (90 Base) MCG/ACT inhaler INHALE 2 PUFFS INTO THE LUNGS 4 TIMES DAILY AS NEEDED FOR WHEEZING 18 Inhaler 2   No current facility-administered medications for this visit.     Past Medical History:  Diagnosis Date  . Acute bronchitis 11/05/2016  . Anxiety   . CAD (coronary artery disease)    Left Main 30% stenosis, LAD 20 - 30 % stenosis, first and second diagonal branchesat 40 - 50%  stenosis with small arteries, circumflex had 30% stenosis in the large obtuse marginal, RCA at 70 - 80%  stenosis [not felt to be occlusive after evaluation with flow wire], distal 50 - 60% stenosis - Bandy Honaker[  . Chronic insomnia   . 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 DDD (degenerative disc disease)   . Depression   . Diabetes mellitus without complication ([place]) 1/96/2229   no medications now 07/24/2016  . GERD (gastroesophageal reflux disease)   . Gout   . History of  cardiovascular stress test    Myoview 7/16:  Diaphragmatic attenuation, no ischemia, EF 56%; Low Risk  . History of radiation therapy 11/10/13- 12/29/13   left lung 6600 cGy in 33 sessions  . Hx of colonoscopy   . Hyperlipidemia   . Hypertension    dr Percival Spanish  . Itching due  to drug 03/19/2016  . Lung cancer ([place]) 10/04/13   LUL squamous cell lung cancer  . Nausea with vomiting 03/26/2016  . OSA (obstructive sleep apnea)    NPSG 09/10/10- AHI 11.3/hr  . PVD (peripheral vascular disease) ([place])    PTA/Stent right common iliac    Past Surgical History:  Procedure Laterality Date  . ANGIOPLASTY    . ANTERIOR FUSION CERVICAL SPINE     cervical fusion  7 yrs ago ([facility])  . arm surgery     left elbow  . BACK SURGERY     lower  . c-spine surgery    . COLON SURGERY     '11 "Diverticulitis"  . COLONOSCOPY WITH PROPOFOL N/A 05/22/2015   Procedure: COLONOSCOPY WITH PROPOFOL;  Surgeon: Inda Castle, MD;  Location: [facility];  Service: Endoscopy;  Laterality: N/A;  . HIP SURGERY     left 'bone graft taken"  . PERIPHERAL VASCULAR CATHETERIZATION N/A 08/10/2015   Procedure: Lower Extremity Angiography;  Surgeon: Lorretta Harp, MD; Distal Ao OK, L-EIA stent OK, R-CIA 100% s/p overlapping 7 mm x 38 mm ICast stents, 50-60% R-CFA      . SHOULDER SURGERY     right  . SPINAL FUSION  03/05/2007   L4-L5  . VIDEO BRONCHOSCOPY WITH ENDOBRONCHIAL NAVIGATION N/A 10/04/2013   Procedure: VIDEO BRONCHOSCOPY WITH ENDOBRONCHIAL NAVIGATION;  Surgeon: Collene Gobble, MD;  Location: [facility];  Service: Thoracic;  Laterality: N/A;    ROS:     As stated in the HPI and negative for all other systems.  PHYSICAL EXAM BP 134/78 (BP Location: Right Arm)   Pulse (!) 110   Ht '5\' 6"'$  (1.676 m)   Wt 172 lb (78 kg)   BMI 27.76 kg/m  GENERAL:  Well appearing HEENT:  Pupils equal round and reactive, fundi not visualized, oral mucosa unremarkable, dentures NECK:  No jugular venous distention, waveform within normal  limits, carotid upstroke brisk and symmetric, no bruits, no thyromegaly LYMPHATICS:  No cervical, inguinal adenopathy LUNGS:  Decreased breath sounds bilaterally with expiratory wheezing BACK:  No CVA tenderness CHEST:  Unremarkable HEART:  PMI not displaced or sustained,S1 and S2 within normal limits, no S3, no S4, no clicks, no rubs, no murmurs ABD:  Flat, positive bowel sounds normal in frequency in pitch, no bruits, no rebound, no guarding, no midline pulsatile mass, no hepatomegaly, no splenomegaly EXT:  2 plus pulses upper, DP/PT left 2 plus,  2+ right posterior tibialis ,  no edema, no cyanosis no clubbing SKIN:  No rashes no nodules  EKG:  Sinus rhythm, rate 110 , axis within normal limits, intervals within normal limits, no acute ST-T wave changes.  11/08/2016   ASSESSMENT AND PLAN  CLAUDICATION - He is much better since percutaneous revascularization in the past. He'll continue with risk reduction.  CORONARY ATHEROSCLEROSIS NATIVE CORONARY ARTERY -  He had a stress test in 2016 that was not high risk. His current chest pain is very atypical. No change in therapy is indicated.  HYPERTENSION -  His blood pressure is controlled and he will continue the meds as listed.  TOBACCO - He is still not smoking.

## 2016-11-08 ENCOUNTER — Ambulatory Visit (INDEPENDENT_AMBULATORY_CARE_PROVIDER_SITE_OTHER): Payer: Commercial Managed Care - HMO | Admitting: Cardiology

## 2016-11-08 ENCOUNTER — Encounter: Payer: Self-pay | Admitting: Cardiology

## 2016-11-08 ENCOUNTER — Other Ambulatory Visit: Payer: Self-pay | Admitting: Internal Medicine

## 2016-11-08 VITALS — BP 134/78 | HR 110 | Ht 66.0 in | Wt 172.0 lb

## 2016-11-08 DIAGNOSIS — I10 Essential (primary) hypertension: Secondary | ICD-10-CM | POA: Diagnosis not present

## 2016-11-08 DIAGNOSIS — I2510 Atherosclerotic heart disease of native coronary artery without angina pectoris: Secondary | ICD-10-CM | POA: Diagnosis not present

## 2016-11-08 MED ORDER — ARFORMOTEROL TARTRATE 15 MCG/2ML IN NEBU: 15.0000 ug | INHALATION_SOLUTION | Freq: Two times a day (BID) | RESPIRATORY_TRACT | 0 refills | Status: DC

## 2016-11-08 NOTE — Telephone Encounter (Signed)
Called and spoke with pt and he is aware of some samples that are up front and he will come by and pick these up since he is out of this medication and we are waiting for the PA to be done. Will forward back to [place] to follow up on PA.

## 2016-11-08 NOTE — Telephone Encounter (Signed)
Wife (anita) called back about Ernest [name], would like to know if the clinic have any samples, patient is out of the medication. Contact # [phone].Mearl Latin

## 2016-11-08 NOTE — Patient Instructions (Signed)

## 2016-11-09 ENCOUNTER — Encounter: Payer: Self-pay | Admitting: Cardiology

## 2016-11-11 ENCOUNTER — Emergency Department (HOSPITAL_COMMUNITY): Payer: Commercial Managed Care - HMO

## 2016-11-11 ENCOUNTER — Other Ambulatory Visit: Payer: Self-pay | Admitting: Neurology

## 2016-11-11 ENCOUNTER — Encounter (HOSPITAL_COMMUNITY): Payer: Self-pay | Admitting: *Deleted

## 2016-11-11 ENCOUNTER — Inpatient Hospital Stay (HOSPITAL_COMMUNITY)
Admission: EM | Admit: 2016-11-11 | Discharge: 2016-11-19 | DRG: 871 | Disposition: A | Discharge status: Home Health | Payer: Commercial Managed Care - HMO | Attending: Internal Medicine | Admitting: Internal Medicine

## 2016-11-11 ENCOUNTER — Telehealth: Payer: Self-pay | Admitting: Medical Oncology

## 2016-11-11 ENCOUNTER — Inpatient Hospital Stay (HOSPITAL_COMMUNITY): Payer: Commercial Managed Care - HMO

## 2016-11-11 DIAGNOSIS — E118 Type 2 diabetes mellitus with unspecified complications: Secondary | ICD-10-CM

## 2016-11-11 DIAGNOSIS — R269 Unspecified abnormalities of gait and mobility: Secondary | ICD-10-CM | POA: Diagnosis not present

## 2016-11-11 DIAGNOSIS — J440 Chronic obstructive pulmonary disease with acute lower respiratory infection: Secondary | ICD-10-CM | POA: Diagnosis present

## 2016-11-11 DIAGNOSIS — J9621 Acute and chronic respiratory failure with hypoxia: Secondary | ICD-10-CM | POA: Diagnosis not present

## 2016-11-11 DIAGNOSIS — E1151 Type 2 diabetes mellitus with diabetic peripheral angiopathy without gangrene: Secondary | ICD-10-CM | POA: Diagnosis not present

## 2016-11-11 DIAGNOSIS — J441 Chronic obstructive pulmonary disease with (acute) exacerbation: Secondary | ICD-10-CM | POA: Diagnosis not present

## 2016-11-11 DIAGNOSIS — J45901 Unspecified asthma with (acute) exacerbation: Secondary | ICD-10-CM | POA: Diagnosis present

## 2016-11-11 DIAGNOSIS — I10 Essential (primary) hypertension: Secondary | ICD-10-CM | POA: Diagnosis present

## 2016-11-11 DIAGNOSIS — L8990 Pressure ulcer of unspecified site, unspecified stage: Secondary | ICD-10-CM | POA: Diagnosis not present

## 2016-11-11 DIAGNOSIS — G894 Chronic pain syndrome: Secondary | ICD-10-CM | POA: Diagnosis not present

## 2016-11-11 DIAGNOSIS — J9622 Acute and chronic respiratory failure with hypercapnia: Secondary | ICD-10-CM | POA: Diagnosis present

## 2016-11-11 DIAGNOSIS — R110 Nausea: Secondary | ICD-10-CM | POA: Diagnosis not present

## 2016-11-11 DIAGNOSIS — Z9289 Personal history of other medical treatment: Secondary | ICD-10-CM

## 2016-11-11 DIAGNOSIS — J9602 Acute respiratory failure with hypercapnia: Secondary | ICD-10-CM | POA: Diagnosis present

## 2016-11-11 DIAGNOSIS — J449 Chronic obstructive pulmonary disease, unspecified: Secondary | ICD-10-CM | POA: Diagnosis not present

## 2016-11-11 DIAGNOSIS — K219 Gastro-esophageal reflux disease without esophagitis: Secondary | ICD-10-CM | POA: Diagnosis present

## 2016-11-11 DIAGNOSIS — E785 Hyperlipidemia, unspecified: Secondary | ICD-10-CM | POA: Diagnosis present

## 2016-11-11 DIAGNOSIS — G4733 Obstructive sleep apnea (adult) (pediatric): Secondary | ICD-10-CM | POA: Diagnosis present

## 2016-11-11 DIAGNOSIS — E876 Hypokalemia: Secondary | ICD-10-CM | POA: Diagnosis not present

## 2016-11-11 DIAGNOSIS — R069 Unspecified abnormalities of breathing: Secondary | ICD-10-CM | POA: Diagnosis not present

## 2016-11-11 DIAGNOSIS — L89152 Pressure ulcer of sacral region, stage 2: Secondary | ICD-10-CM | POA: Diagnosis present

## 2016-11-11 DIAGNOSIS — J9601 Acute respiratory failure with hypoxia: Secondary | ICD-10-CM | POA: Diagnosis present

## 2016-11-11 DIAGNOSIS — M109 Gout, unspecified: Secondary | ICD-10-CM | POA: Diagnosis present

## 2016-11-11 DIAGNOSIS — F411 Generalized anxiety disorder: Secondary | ICD-10-CM | POA: Diagnosis not present

## 2016-11-11 DIAGNOSIS — Z79899 Other long term (current) drug therapy: Secondary | ICD-10-CM

## 2016-11-11 DIAGNOSIS — Z7982 Long term (current) use of aspirin: Secondary | ICD-10-CM | POA: Diagnosis not present

## 2016-11-11 DIAGNOSIS — C3491 Malignant neoplasm of unspecified part of right bronchus or lung: Secondary | ICD-10-CM

## 2016-11-11 DIAGNOSIS — J189 Pneumonia, unspecified organism: Secondary | ICD-10-CM | POA: Diagnosis not present

## 2016-11-11 DIAGNOSIS — Z7189 Other specified counseling: Secondary | ICD-10-CM

## 2016-11-11 DIAGNOSIS — Z923 Personal history of irradiation: Secondary | ICD-10-CM

## 2016-11-11 DIAGNOSIS — E1165 Type 2 diabetes mellitus with hyperglycemia: Secondary | ICD-10-CM | POA: Diagnosis present

## 2016-11-11 DIAGNOSIS — G893 Neoplasm related pain (acute) (chronic): Secondary | ICD-10-CM | POA: Diagnosis not present

## 2016-11-11 DIAGNOSIS — Z4682 Encounter for fitting and adjustment of non-vascular catheter: Secondary | ICD-10-CM | POA: Diagnosis not present

## 2016-11-11 DIAGNOSIS — F5104 Psychophysiologic insomnia: Secondary | ICD-10-CM | POA: Diagnosis present

## 2016-11-11 DIAGNOSIS — Z87891 Personal history of nicotine dependence: Secondary | ICD-10-CM

## 2016-11-11 DIAGNOSIS — I2510 Atherosclerotic heart disease of native coronary artery without angina pectoris: Secondary | ICD-10-CM

## 2016-11-11 DIAGNOSIS — Z515 Encounter for palliative care: Secondary | ICD-10-CM | POA: Diagnosis not present

## 2016-11-11 DIAGNOSIS — R0602 Shortness of breath: Secondary | ICD-10-CM | POA: Diagnosis not present

## 2016-11-11 DIAGNOSIS — F418 Other specified anxiety disorders: Secondary | ICD-10-CM | POA: Diagnosis present

## 2016-11-11 DIAGNOSIS — E872 Acidosis: Secondary | ICD-10-CM | POA: Diagnosis present

## 2016-11-11 DIAGNOSIS — C3490 Malignant neoplasm of unspecified part of unspecified bronchus or lung: Secondary | ICD-10-CM | POA: Diagnosis not present

## 2016-11-11 DIAGNOSIS — A419 Sepsis, unspecified organism: Principal | ICD-10-CM | POA: Diagnosis present

## 2016-11-11 DIAGNOSIS — Z981 Arthrodesis status: Secondary | ICD-10-CM | POA: Diagnosis not present

## 2016-11-11 DIAGNOSIS — R791 Abnormal coagulation profile: Secondary | ICD-10-CM | POA: Diagnosis not present

## 2016-11-11 DIAGNOSIS — R0902 Hypoxemia: Secondary | ICD-10-CM | POA: Diagnosis not present

## 2016-11-11 DIAGNOSIS — Z66 Do not resuscitate: Secondary | ICD-10-CM | POA: Diagnosis present

## 2016-11-11 LAB — MRSA PCR SCREENING: MRSA BY PCR: NEGATIVE

## 2016-11-11 LAB — RESPIRATORY PANEL BY PCR
ADENOVIRUS-RVPPCR: NOT DETECTED
Bordetella pertussis: NOT DETECTED
CHLAMYDOPHILA PNEUMONIAE-RVPPCR: NOT DETECTED
CORONAVIRUS 229E-RVPPCR: NOT DETECTED
CORONAVIRUS HKU1-RVPPCR: NOT DETECTED
CORONAVIRUS OC43-RVPPCR: NOT DETECTED
Coronavirus NL63: NOT DETECTED
Influenza A: NOT DETECTED
Influenza B: NOT DETECTED
MYCOPLASMA PNEUMONIAE-RVPPCR: NOT DETECTED
Metapneumovirus: NOT DETECTED
PARAINFLUENZA VIRUS 1-RVPPCR: NOT DETECTED
PARAINFLUENZA VIRUS 4-RVPPCR: NOT DETECTED
Parainfluenza Virus 2: NOT DETECTED
Parainfluenza Virus 3: NOT DETECTED
Respiratory Syncytial Virus: NOT DETECTED
Rhinovirus / Enterovirus: NOT DETECTED

## 2016-11-11 LAB — CBC WITH DIFFERENTIAL/PLATELET
Basophils Absolute: 0 10*3/uL (ref 0.0–0.1)
Basophils Relative: 0 %
Eosinophils Absolute: 1.2 10*3/uL — ABNORMAL HIGH (ref 0.0–0.7)
Eosinophils Relative: 4 %
HCT: 42.1 % (ref 39.0–52.0)
Hemoglobin: 13.2 g/dL (ref 13.0–17.0)
Lymphocytes Relative: 12 %
Lymphs Abs: 3.5 10*3/uL (ref 0.7–4.0)
MCH: 28.9 pg (ref 26.0–34.0)
MCHC: 31.4 g/dL (ref 30.0–36.0)
MCV: 92.3 fL (ref 78.0–100.0)
Monocytes Absolute: 1.5 10*3/uL — ABNORMAL HIGH (ref 0.1–1.0)
Monocytes Relative: 5 %
Neutro Abs: 23.3 10*3/uL — ABNORMAL HIGH (ref 1.7–7.7)
Neutrophils Relative %: 79 %
Platelets: 215 10*3/uL (ref 150–400)
RBC: 4.56 MIL/uL (ref 4.22–5.81)
RDW: 18.7 % — ABNORMAL HIGH (ref 11.5–15.5)
WBC: 29.5 10*3/uL — ABNORMAL HIGH (ref 4.0–10.5)

## 2016-11-11 LAB — BASIC METABOLIC PANEL
ANION GAP: 12 (ref 5–15)
BUN: 10 mg/dL (ref 6–20)
CHLORIDE: 101 mmol/L (ref 101–111)
CO2: 21 mmol/L — ABNORMAL LOW (ref 22–32)
Calcium: 8.6 mg/dL — ABNORMAL LOW (ref 8.9–10.3)
Creatinine, Ser: 0.84 mg/dL (ref 0.61–1.24)
GFR calc Af Amer: >60 mL/min (ref >60)
Glucose, Bld: 190 mg/dL — ABNORMAL HIGH (ref 65–99)
POTASSIUM: 4.6 mmol/L (ref 3.5–5.1)
SODIUM: 134 mmol/L — AB (ref 135–145)

## 2016-11-11 LAB — I-STAT CHEM 8, ED
BUN: 12 mg/dL (ref 6–20)
Calcium, Ion: 1.18 mmol/L (ref 1.15–1.40)
Chloride: 100 mmol/L — ABNORMAL LOW (ref 101–111)
Creatinine, Ser: 0.8 mg/dL (ref 0.61–1.24)
Glucose, Bld: 191 mg/dL — ABNORMAL HIGH (ref 65–99)
HCT: 42 % (ref 39.0–52.0)
Hemoglobin: 14.3 g/dL (ref 13.0–17.0)
Potassium: 4.5 mmol/L (ref 3.5–5.1)
Sodium: 136 mmol/L (ref 135–145)
TCO2: 27 mmol/L (ref 0–100)

## 2016-11-11 LAB — I-STAT ARTERIAL BLOOD GAS, ED
Acid-base deficit: 1 mmol/L (ref 0.0–2.0)
Bicarbonate: 28.6 mmol/L — ABNORMAL HIGH (ref 20.0–28.0)
O2 Saturation: 88 %
Patient temperature: 99.5
TCO2: 31 mmol/L (ref 0–100)
pCO2 arterial: 74.6 mmHg (ref 32.0–48.0)
pH, Arterial: 7.194 — CL (ref 7.350–7.450)
pO2, Arterial: 70 mmHg — ABNORMAL LOW (ref 83.0–108.0)

## 2016-11-11 LAB — I-STAT TROPONIN, ED: TROPONIN I, POC: 0.08 ng/mL (ref 0.00–0.08)

## 2016-11-11 LAB — GLUCOSE, CAPILLARY
Glucose-Capillary: 223 mg/dL — ABNORMAL HIGH (ref 65–99)
Glucose-Capillary: 183 mg/dL — ABNORMAL HIGH (ref 65–99)
Glucose-Capillary: 299 mg/dL — ABNORMAL HIGH (ref 65–99)
Glucose-Capillary: 160 mg/dL — ABNORMAL HIGH (ref 65–99)
Glucose-Capillary: 148 mg/dL — ABNORMAL HIGH (ref 65–99)

## 2016-11-11 LAB — D-DIMER, QUANTITATIVE: D-Dimer, Quant: 2.05 ug{FEU}/mL — ABNORMAL HIGH (ref 0.00–0.50)

## 2016-11-11 LAB — LACTIC ACID, PLASMA
LACTIC ACID, VENOUS: 3.4 mmol/L — AB (ref 0.5–1.9)
LACTIC ACID, VENOUS: 2.8 mmol/L — AB (ref 0.5–1.9)

## 2016-11-11 LAB — BRAIN NATRIURETIC PEPTIDE: B Natriuretic Peptide: 61.1 pg/mL (ref 0.0–100.0)

## 2016-11-11 LAB — I-STAT CG4 LACTIC ACID, ED: LACTIC ACID, VENOUS: 1.4 mmol/L (ref 0.5–1.9)

## 2016-11-11 MED ORDER — PREDNISONE 20 MG PO TABS
40.0000 mg | ORAL_TABLET | Freq: Every day | ORAL | Status: DC
  Administered 2016-11-11: 40 mg
  Filled 2016-11-11: qty 2

## 2016-11-11 MED ORDER — FENTANYL CITRATE (PF) 100 MCG/2ML IJ SOLN
100.0000 ug | Freq: Once | INTRAMUSCULAR | Status: AC
  Administered 2016-11-11: 100 ug via INTRAVENOUS
  Filled 2016-11-11: qty 2

## 2016-11-11 MED ORDER — ADULT MULTIVITAMIN LIQUID CH
15.0000 mL | Freq: Every day | ORAL | Status: DC
  Administered 2016-11-11 – 2016-11-14 (×4): 15 mL
  Filled 2016-11-11 (×4): qty 15

## 2016-11-11 MED ORDER — VITAL AF 1.2 CAL PO LIQD
1000.0000 mL | ORAL | Status: DC
  Administered 2016-11-11 – 2016-11-13 (×3): 1000 mL
  Filled 2016-11-11: qty 1000

## 2016-11-11 MED ORDER — INSULIN ASPART 100 UNIT/ML ~~LOC~~ SOLN
0.0000 [IU] | SUBCUTANEOUS | Status: DC
  Administered 2016-11-11: 11 [IU] via SUBCUTANEOUS
  Administered 2016-11-11: 4 [IU] via SUBCUTANEOUS
  Administered 2016-11-11 – 2016-11-12 (×2): 3 [IU] via SUBCUTANEOUS
  Administered 2016-11-12 (×2): 4 [IU] via SUBCUTANEOUS
  Administered 2016-11-12 – 2016-11-13 (×2): 3 [IU] via SUBCUTANEOUS
  Administered 2016-11-13: 4 [IU] via SUBCUTANEOUS
  Administered 2016-11-13 – 2016-11-15 (×5): 3 [IU] via SUBCUTANEOUS
  Administered 2016-11-17: 4 [IU] via SUBCUTANEOUS
  Administered 2016-11-17 – 2016-11-18 (×3): 3 [IU] via SUBCUTANEOUS
  Administered 2016-11-18: 4 [IU] via SUBCUTANEOUS
  Administered 2016-11-18 – 2016-11-19 (×4): 3 [IU] via SUBCUTANEOUS
  Administered 2016-11-19: 4 [IU] via SUBCUTANEOUS
  Administered 2016-11-19: 3 [IU] via SUBCUTANEOUS

## 2016-11-11 MED ORDER — PIPERACILLIN-TAZOBACTAM 3.375 G IVPB
3.3750 g | Freq: Three times a day (TID) | INTRAVENOUS | Status: AC
  Administered 2016-11-11 – 2016-11-18 (×20): 3.375 g via INTRAVENOUS
  Filled 2016-11-11 (×24): qty 50

## 2016-11-11 MED ORDER — PIPERACILLIN-TAZOBACTAM 3.375 G IVPB 30 MIN
3.3750 g | Freq: Once | INTRAVENOUS | Status: AC
  Administered 2016-11-11: 3.375 g via INTRAVENOUS
  Filled 2016-11-11: qty 50

## 2016-11-11 MED ORDER — ENOXAPARIN SODIUM 40 MG/0.4ML ~~LOC~~ SOLN
40.0000 mg | SUBCUTANEOUS | Status: DC
  Administered 2016-11-11 – 2016-11-18 (×8): 40 mg via SUBCUTANEOUS
  Filled 2016-11-11 (×8): qty 0.4

## 2016-11-11 MED ORDER — IPRATROPIUM BROMIDE 0.02 % IN SOLN
1.0000 mg | Freq: Once | RESPIRATORY_TRACT | Status: AC
  Administered 2016-11-11: 1 mg via RESPIRATORY_TRACT
  Filled 2016-11-11: qty 5

## 2016-11-11 MED ORDER — SUCCINYLCHOLINE CHLORIDE 20 MG/ML IJ SOLN
INTRAMUSCULAR | Status: AC | PRN
  Administered 2016-11-11: 20 mg via INTRAVENOUS

## 2016-11-11 MED ORDER — VANCOMYCIN HCL IN DEXTROSE 1-5 GM/200ML-% IV SOLN
1000.0000 mg | Freq: Once | INTRAVENOUS | Status: AC
  Administered 2016-11-11: 1000 mg via INTRAVENOUS
  Filled 2016-11-11: qty 200

## 2016-11-11 MED ORDER — INSULIN ASPART 100 UNIT/ML ~~LOC~~ SOLN
2.0000 [IU] | SUBCUTANEOUS | Status: DC
  Administered 2016-11-11: 4 [IU] via SUBCUTANEOUS
  Administered 2016-11-11: 6 [IU] via SUBCUTANEOUS

## 2016-11-11 MED ORDER — ALBUTEROL SULFATE (2.5 MG/3ML) 0.083% IN NEBU
2.5000 mg | INHALATION_SOLUTION | RESPIRATORY_TRACT | Status: DC | PRN
  Administered 2016-11-16 – 2016-11-18 (×3): 2.5 mg via RESPIRATORY_TRACT
  Filled 2016-11-11 (×3): qty 3

## 2016-11-11 MED ORDER — IPRATROPIUM-ALBUTEROL 0.5-2.5 (3) MG/3ML IN SOLN
3.0000 mL | RESPIRATORY_TRACT | Status: DC
  Administered 2016-11-11 – 2016-11-19 (×48): 3 mL via RESPIRATORY_TRACT
  Filled 2016-11-11 (×46): qty 3

## 2016-11-11 MED ORDER — ALBUTEROL (5 MG/ML) CONTINUOUS INHALATION SOLN
15.0000 mg/h | INHALATION_SOLUTION | Freq: Once | RESPIRATORY_TRACT | Status: AC
  Administered 2016-11-11: 15 mg/h via RESPIRATORY_TRACT
  Filled 2016-11-11: qty 20

## 2016-11-11 MED ORDER — SODIUM CHLORIDE 0.9 % IV BOLUS (SEPSIS)
1000.0000 mL | Freq: Once | INTRAVENOUS | Status: AC
  Administered 2016-11-11: 1000 mL via INTRAVENOUS

## 2016-11-11 MED ORDER — METHYLPREDNISOLONE SODIUM SUCC 125 MG IJ SOLR
40.0000 mg | Freq: Four times a day (QID) | INTRAMUSCULAR | Status: AC
  Administered 2016-11-11 – 2016-11-12 (×3): 40 mg via INTRAVENOUS
  Filled 2016-11-11 (×3): qty 2

## 2016-11-11 MED ORDER — SODIUM CHLORIDE 0.9 % IV SOLN
250.0000 mL | INTRAVENOUS | Status: DC | PRN
  Administered 2016-11-11: 20 mL via INTRAVENOUS

## 2016-11-11 MED ORDER — JEVITY 1.2 CAL PO LIQD: 1000.0000 mL | ORAL | Status: DC

## 2016-11-11 MED ORDER — FAMOTIDINE IN NACL 20-0.9 MG/50ML-% IV SOLN
20.0000 mg | Freq: Two times a day (BID) | INTRAVENOUS | Status: DC
  Administered 2016-11-11 – 2016-11-14 (×8): 20 mg via INTRAVENOUS
  Filled 2016-11-11 (×8): qty 50

## 2016-11-11 MED ORDER — ORAL CARE MOUTH RINSE
15.0000 mL | Freq: Four times a day (QID) | OROMUCOSAL | Status: DC
  Administered 2016-11-11 – 2016-11-15 (×18): 15 mL via OROMUCOSAL

## 2016-11-11 MED ORDER — IPRATROPIUM-ALBUTEROL 0.5-2.5 (3) MG/3ML IN SOLN
RESPIRATORY_TRACT | Status: AC
  Administered 2016-11-11: 3 mL via RESPIRATORY_TRACT
  Filled 2016-11-11: qty 3

## 2016-11-11 MED ORDER — VANCOMYCIN HCL IN DEXTROSE 750-5 MG/150ML-% IV SOLN
750.0000 mg | Freq: Three times a day (TID) | INTRAVENOUS | Status: DC
  Administered 2016-11-11 – 2016-11-13 (×7): 750 mg via INTRAVENOUS
  Filled 2016-11-11 (×8): qty 150

## 2016-11-11 MED ORDER — PRO-STAT SUGAR FREE PO LIQD
30.0000 mL | Freq: Three times a day (TID) | ORAL | Status: DC
  Administered 2016-11-11 – 2016-11-14 (×9): 30 mL
  Filled 2016-11-11 (×9): qty 30

## 2016-11-11 MED ORDER — SODIUM CHLORIDE 0.9 % IV SOLN
INTRAVENOUS | Status: DC
  Administered 2016-11-11: 06:00:00 via INTRAVENOUS

## 2016-11-11 MED ORDER — IOPAMIDOL (ISOVUE-370) INJECTION 76%
INTRAVENOUS | Status: AC
Start: 2016-11-11 — End: 2016-11-11
  Administered 2016-11-11: 100 mL
  Filled 2016-11-11: qty 100

## 2016-11-11 MED ORDER — PROPOFOL 1000 MG/100ML IV EMUL
INTRAVENOUS | Status: AC
  Filled 2016-11-11: qty 100

## 2016-11-11 MED ORDER — ETOMIDATE 2 MG/ML IV SOLN
INTRAVENOUS | Status: AC | PRN
  Administered 2016-11-11: 20 mg via INTRAVENOUS

## 2016-11-11 MED ORDER — PROPOFOL 1000 MG/100ML IV EMUL
5.0000 ug/kg/min | INTRAVENOUS | Status: DC
  Administered 2016-11-11: 35 ug/kg/min via INTRAVENOUS
  Administered 2016-11-11 (×2): 50 ug/kg/min via INTRAVENOUS
  Administered 2016-11-11: 15 ug/kg/min via INTRAVENOUS
  Administered 2016-11-11: 45 ug/kg/min via INTRAVENOUS
  Administered 2016-11-11: 40 ug/kg/min via INTRAVENOUS
  Administered 2016-11-12: 30 ug/kg/min via INTRAVENOUS
  Administered 2016-11-12: 60 ug/kg/min via INTRAVENOUS
  Administered 2016-11-12 (×3): 50 ug/kg/min via INTRAVENOUS
  Administered 2016-11-13 (×3): 60 ug/kg/min via INTRAVENOUS
  Filled 2016-11-11 (×13): qty 100

## 2016-11-11 MED ORDER — PREDNISONE 5 MG/ML PO CONC
40.0000 mg | Freq: Every day | ORAL | Status: DC
  Filled 2016-11-11: qty 8

## 2016-11-11 MED ORDER — CHLORHEXIDINE GLUCONATE 0.12% ORAL RINSE (MEDLINE KIT)
15.0000 mL | Freq: Two times a day (BID) | OROMUCOSAL | Status: DC
  Administered 2016-11-11 – 2016-11-15 (×10): 15 mL via OROMUCOSAL

## 2016-11-11 NOTE — Telephone Encounter (Signed)
Pt in [facility] hospital on ventilator since 0300. He also had Ct scan. Wife asking about scan results.

## 2016-11-11 NOTE — Progress Notes (Signed)
PULMONARY / CRITICAL CARE MEDICINE   Name: Ernest [name] MRN: [id number] DOB: 01-29-1956    ADMISSION DATE:  11/11/2016 CONSULTATION DATE:   REFERRING MD:  EDP  CHIEF COMPLAINT:  Shortness of breath  HISTORY OF PRESENT ILLNESS:   [name] is a 84M with PMH significant for COPD, stage IV NSCLC on active chemo with docetaxel and Cyramza, DMII, CAD, HTN, H LD, OSA and PVD, who presents with acute onset shortness of breath that awakened him from sleep and was quite severe. He has been more short of breath than usual for a few days. This was accompanied by congestion. No fever / chills / diarrhea / nausea / vomiting. His wife reports he felt like he had phlegm, but couldn't get it up. No hemoptysis. He was treating his congestion with OTC mucinex and had increased his rescue albuterol use. He went to bed doing okay (he always has to sleep sitting up), but then awoke around 3am and needed to go to the hospital. EMS was called and found him minimally responsive. His respirations were assisted with BVM en route. Once in the ED, he was intubated.   Labs showed elevated WBC (which seems chronic for him and may be chemo related), normal renal function, normal BNP, normal troponin, normal lactate. ABG was consistent with a respiratory acidosis. CXR with perhaps some interval enlargement in chronic right sided findings as well as interstitial prominence concerning for possible infection/postobstructive pneumonia vs acute edema.  Since being intubated, his mental status has improved. He is arousable and briskly follows commands. He denies pain.   SUBJECTIVE:  No events since admission, comfortable on vent.  VITAL SIGNS: BP 112/72 (BP Location: Left Arm)   Pulse 99   Temp 99 F (37.2 C) (Oral)   Resp (!) 21   Ht '5\' 6"'$  (1.676 m)   Wt 77.5 kg (170 lb 13.7 oz)   SpO2 98%   BMI 27.58 kg/m   HEMODYNAMICS:    VENTILATOR SETTINGS: Vent Mode: PSV;CPAP FiO2 (%):  [40 %-70 %] 40 % Set  Rate:  [10 bmp-20 bmp] 10 bmp Vt Set:  [510 mL] 510 mL PEEP:  [5 cmH20] 5 cmH20 Pressure Support:  [8 cmH20] 8 cmH20 Plateau Pressure:  [24 cmH20-26 cmH20] 24 cmH20  INTAKE / OUTPUT: I/O last 3 completed shifts: In: 1269 [I.V.:239; NG/GT:30; IV Piggyback:1000] Out: 700 [Emesis/NG output:700]  PHYSICAL EXAMINATION:  General Well nourished, well developed, intubated, sedated  HEENT No gross abnormalities. OETT, OGT in place  Pulmonary Coarse inspiratory phase with prolonged expiratory wheeze throughout. Vent-assisted effort, symmetrical expansion, frequent coughing.   Cardiovascular Normal rate, regular rhythm. S1, s2. No m/r/g. Distal pulses palpable.  Abdomen Soft, non-tender, non-distended, positive bowel sounds, no palpable organomegaly or masses. Normoresonant to percussion.  Musculoskeletal Grossly normal   Lymphatics No cervical, supraclavicular or axillary adenopathy.   Neurologic Grossly intact. No focal deficits.   Skin/Integuement No cyanosis, no clubbing, no edema. Dry skin with scattered nummular lesions   LABS:  BMET  Recent Labs Lab 11/05/16 1110 11/11/16 0340 11/11/16 0352  NA 133* 134* 136  K 4.4 4.6 4.5  CL  --  101 100*  CO2 27 21*  --   BUN 10.'6 10 12  '$ CREATININE 0.7 0.84 0.80  GLUCOSE 114 190* 191*   Electrolytes  Recent Labs Lab 11/05/16 1110 11/11/16 0340  CALCIUM 9.6 8.6*   CBC  Recent Labs Lab 11/05/16 1110 11/11/16 0340 11/11/16 0352  WBC 18.7* 29.5*  --  HGB 12.0* 13.2 14.3  HCT 37.7* 42.1 42.0  PLT 111* 215  --    Coag's No results for input(s): APTT, INR in the last 168 hours.  Sepsis Markers  Recent Labs Lab 11/11/16 0504  LATICACIDVEN 1.40   ABG  Recent Labs Lab 11/11/16 0356  PHART 7.194*  PCO2ART 74.6*  PO2ART 70.0*   Liver Enzymes  Recent Labs Lab 11/05/16 1110  AST 28  ALT 21  ALKPHOS 102  BILITOT 0.48  ALBUMIN 3.3*   Cardiac Enzymes No results for input(s): TROPONINI, PROBNP in the last 168  hours.  Glucose  Recent Labs Lab 11/11/16 0827 11/11/16 1148  GLUCAP 223* 183*   Imaging Dg Chest Portable 1 View  Result Date: 11/11/2016 CLINICAL DATA:  Emergent intubation. Shortness of breath. History of lung cancer. EXAM: PORTABLE CHEST 1 VIEW COMPARISON:  Chest radiograph October 22, 2016 FINDINGS: Endotracheal tube tip projects 3 cm above the carina. Negative nasogastric tube tip and side-port project in proximal stomach. Multiple masses project in RIGHT lung, and appear larger than prior examination. LEFT apical pleural thickening and scarring. Predominately in RIGHT interstitial prominence. Increased lung volumes. No pleural effusions. No pneumothorax. Mild cardiomegaly. ACDF. Osseous structure nonsuspicious. IMPRESSION: Endotracheal tube tip projects 3 cm above the carina. Nasogastric tube tip projects in proximal stomach. Multiple RIGHT lung/lymphadenopathy masses appear larger though, this could be technical. Interstitial prominence RIGHT lung concerning for infection/postobstructive pneumonia versus acute edema. Electronically Signed   By: Elon Alas M.D.   On: 11/11/2016 04:14   Ct Angio Chest/abd/pel For Dissection W And/or Wo Contrast  Result Date: 11/11/2016 CLINICAL DATA:  60 year old male with elevated D-dimer and respiratory distress and hypoxia. History of lung cancer. EXAM: CT ANGIOGRAPHY CHEST, ABDOMEN AND PELVIS TECHNIQUE: Multidetector CT imaging through the chest, abdomen and pelvis was performed using the standard protocol during bolus administration of intravenous contrast. Multiplanar reconstructed images and MIPs were obtained and reviewed to evaluate the vascular anatomy. CONTRAST:  100 cc Isovue 370 COMPARISON:  Chest radiograph dated 11/11/2016 and chest CT dated 07/24/2016 and 06/07/2016 FINDINGS: CTA CHEST FINDINGS Cardiovascular: Top-normal cardiac size with mild dilatation of the left ventricle. No pericardial effusion. There is coronary vascular  calcification primarily involving the LAD and left circumflex artery. There is mild atherosclerotic plaque in the thoracic aorta. There is no aneurysmal dilatation or evidence of dissection. The origin of the right vertebral artery is not well visualized and is artery may be hypoplastic. The remainder of the visualized origins of the great vessels of the aortic arch appear patent. There is mild dilatation of the main pulmonary trunk suggestive of underlying pulmonary hypertension. No CT evidence of pulmonary embolism. Mediastinum/Nodes: Significant interval progression of right hilar and mediastinal adenopathy. Subcarinal adenopathy measures 6.5 x 9.5 cm (previously approximately 3.6 x 3.1 cm). Right upper mediastinal/paratracheal adenopathy or confluence of lymph nodes measure approximately 7.7 x 8.2 cm (previously 4.1 x 3.8 cm). There is loss of right paratracheal fat plane. An enteric tube is noted in the esophagus. There is infiltration of the paraesophageal fat. There is encasement and mild narrowing of the right upper lobe bronchus. There is also encasement of the bronchus intermedius. The central airways however remain patent. Lungs/Pleura: There is a 10.2 x 7.3 cm right lower lobe mass (previously 6.3 x 5.3 cm). This mass appears in continuity with the subcarinal mass. There is large area of airspace consolidation in the right upper lobe stop, most likely representing superimposed pneumonia. A 12 mm nodular density in  the right upper lobe anteriorly likely pneumonia and less likely metastatic disease. There has been interval increase in the size of the left upper lobe consolidation/fibrosis and cavitation. There is a small right pleural effusion. No pneumothorax. There is mild right-sided tracheal shift likely related to right lung volume loss or traction fibrosis. An endotracheal diffuse is noted with tip approximately 3.5 cm above the carina. Musculoskeletal: There is no axillary adenopathy. The chest wall  soft tissues appear unremarkable. Lower cervical anterior fusion hardware. No acute fracture. Review of the MIP images confirms the above findings. CTA ABDOMEN AND PELVIS FINDINGS VASCULAR Aorta: Moderate aortoiliac atherosclerotic disease. No aneurysmal dilatation or evidence of dissection Celiac: Patent without evidence of aneurysm, dissection, vasculitis or significant stenosis. SMA: Patent without evidence of aneurysm, dissection, vasculitis or significant stenosis. Renals: Both renal arteries are patent without evidence of aneurysm, dissection, vasculitis, fibromuscular dysplasia or significant stenosis. IMA: Patent without evidence of aneurysm, dissection, vasculitis or significant stenosis. Inflow: Advanced atherosclerotic calcification of the iliac vasculature. Left external iliac artery stent appears patent. Veins: No obvious venous abnormality within the limitations of this arterial phase study. Review of the MIP images confirms the above findings. NON-VASCULAR No intra-abdominal free air or free fluid. Hepatobiliary: Slight irregularity of the hepatic contour. No enhancing lesion. No intrahepatic biliary ductal dilatation. The gallbladder is unremarkable. Pancreas: Unremarkable. No pancreatic ductal dilatation or surrounding inflammatory changes. Spleen: A 5.1 x 3.5 cm inferior splenic hypodense lesion, incompletely characterized but similar to prior CT. Adrenals/Urinary Tract: The adrenal glands appear unremarkable. Probable small right renal cyst as seen on the prior CT. There is no hydronephrosis on either side. The visualized ureters and urinary bladder appear unremarkable. Stomach/Bowel: An enteric tube is noted with tip in the proximal stomach. Large amount of stool noted throughout the colon. There is sigmoid diverticulosis without active inflammatory changes. Focal thickening of the sigmoid colon likely related to underdistention and muscular hypertrophy. The degree of stricture is not excluded.  There is no evidence of bowel obstruction or active inflammation. Normal appendix. Lymphatic: Top-normal retroperitoneal and para-aortic lymph nodes. Reproductive: The prostate and seminal vesicles are grossly unremarkable. Other: None Musculoskeletal: L4-L5 posterior fusion hardware. No acute fracture. No suspicious bone lesions. Chronic irregularity of the left iliac crest. No acute fracture. Review of the MIP images confirms the above findings. IMPRESSION: No CT evidence of pulmonary embolism or aortic dissection. Progression of disease with interval increase in the size of the right lower lobe mass with significant interval decrease in the size of the right hilar and mediastinal adenopathy. Large upper lobe airspace consolidative changes most compatible with superimposed pneumonia. Small right pleural effusion. Left apical consolidative change, fibrosis and small cavitation similar to the prior CT. Stable appearing inferior splenic hypodense lesion. Top-normal retroperitoneal and para-aortic lymph nodes. Constipation. No evidence of bowel obstruction or active inflammation. Normal appendix. Sigmoid diverticulosis without active inflammatory changes. Short segment thickened appearance sigmoid colon likely related to muscular hypertrophy with possible mild stricture. Is Electronically Signed   By: Anner Crete M.D.   On: 11/11/2016 07:12     STUDIES:    CULTURES: Blood cx x 2  12/18 >> UA Resp quant 12/18  ANTIBIOTICS: Vancomycin 12/18 >> Zosyn 12/18 >>  SIGNIFICANT EVENTS:   LINES/TUBES: OETT 12/18 OGT 12/18 PIV  DISCUSSION: Mr. Hodsdon is a 36M with multiple comorbidities admitted with what appears to be an acute exacerbation of asthma/COPD. He has wheezing, increased cough and a change in sputum from his baseline. His wife  does not report any other infectious symptoms, but it seems reasonable to err on the side of caution given his ongoing chemotherapy and elevated WBC. We will  continue antibiotics for now, check a viral panel, continue DuoNebs q 4 with albuterol q2h prn, continue steroids and hope that he will be able to wean from the ventilator.   ASSESSMENT / PLAN:  PULMONARY A: Acute hypoxemic/hypercapneic respiratory failure COPD/asthma exacerbation Stage IV NSCLC P:   Continue ventilatory support DuoNebs q 4h scheduled Albuterol q2h prn Change prednisone to IV solumedrol Received vanc/zosyn in the ed  CARDIOVASCULAR A:  No acute issues Hx CAD P:  Monitor  RENAL A:   No acute issues P:   Trend BMP Monitor [facility] to adjust dosing as necessary KVO IVF  GASTROINTESTINAL A:   No acute issues P:   Pepcid for gi ppx TF per nutrition  HEMATOLOGIC A:   Leukocytosis P:  Trend CBC Continue Abx: vanc/zosyn.  INFECTIOUS A:   Concern for sepsis 2/2 pneumonia P:   Received vanc/zosyn in the ED; low threshold to de-escalate Check viral panel, tracheal aspirate Follow blood cultures Procalcitonin protocol  ENDOCRINE A:   DMII   P:   CBG / SSI  NEUROLOGIC A:   No acute issues P:   RASS goal: 0 Change propofol in AM to versed pushes and fentanyl drip  FAMILY  - Updates: No family bedside to update.  - Inter-disciplinary family meet or Palliative Care meeting due by:  day 7  The patient is critically ill with multiple organ systems failure and requires high complexity decision making for assessment and support, frequent evaluation and titration of therapies, application of advanced monitoring technologies and extensive interpretation of multiple databases.   Critical Care Time devoted to patient care services described in this note is  45  Minutes. This time reflects time of care of this signee Dr Jennet Maduro. This critical care time does not reflect procedure time, or teaching time or supervisory time of PA/NP/Med student/Med Resident etc but could involve care discussion time.  Rush Farmer, M.D. [facility]  Pulmonary/Critical Care Medicine. Pager: [phone]. After hours pager: [phone].  11/11/2016, 12:41 PM

## 2016-11-11 NOTE — H&P (Signed)
PULMONARY / CRITICAL CARE MEDICINE   Name: Ernest [name] MRN: [id number] DOB: 31-Jan-1956    ADMISSION DATE:  11/11/2016 CONSULTATION DATE:   REFERRING MD:  EDP  CHIEF COMPLAINT:  Shortness of breath  HISTORY OF PRESENT ILLNESS:   Ernest [name] is a 50M with PMH significant for COPD, stage IV NSCLC on active chemo with docetaxel and Cyramza, DMII, CAD, HTN, H LD, OSA and PVD, who presents with acute onset shortness of breath that awakened him from sleep and was quite severe. He has been more short of breath than usual for a few days. This was accompanied by congestion. No fever / chills / diarrhea / nausea / vomiting. His wife reports he felt like he had phlegm, but couldn't get it up. No hemoptysis. He was treating his congestion with OTC mucinex and had increased his rescue albuterol use. He went to bed doing okay (he always has to sleep sitting up), but then awoke around 3am and needed to go to the hospital. EMS was called and found him minimally responsive. His respirations were assisted with BVM en route. Once in the ED, he was intubated.   Labs showed elevated WBC (which seems chronic for him and may be chemo related), normal renal function, normal BNP, normal troponin, normal lactate. ABG was consistent with a respiratory acidosis. CXR with perhaps some interval enlargement in chronic right sided findings as well as interstitial prominence concerning for possible infection/postobstructive pneumonia vs acute edema.  Since being intubated, his mental status has improved. He is arousable and briskly follows commands. He denies pain.   PAST MEDICAL HISTORY :  He  has a past medical history of Acute bronchitis (11/05/2016); Anxiety; CAD (coronary artery disease); Chronic insomnia; COPD with asthma ([place]) (09/08/2007); DDD (degenerative disc disease); Depression; Diabetes mellitus without complication ([place]) (7/78/2423); GERD (gastroesophageal reflux disease); Gout; History of  cardiovascular stress test; History of radiation therapy (11/10/13- 12/29/13); colonoscopy; Hyperlipidemia; Hypertension; Itching due to drug (03/19/2016); Lung cancer ([place]) (10/04/13); Lung cancer ([place]); Nausea with vomiting (03/26/2016); OSA (obstructive sleep apnea); and PVD (peripheral vascular disease) ([place]).  PAST SURGICAL HISTORY: He  has a past surgical history that includes Spinal fusion (03/05/2007); Hip surgery; arm surgery; Shoulder surgery; c-spine surgery; Angioplasty; Video bronchoscopy with endobronchial navigation (N/A, 10/04/2013); Back surgery; Anterior fusion cervical spine; Colon surgery; Colonoscopy with propofol (N/A, 05/22/2015); and Cardiac catheterization (N/A, 08/10/2015).  Allergies  Allergen Reactions  . Carboplatin Shortness Of Breath, Swelling and Rash    Swelling of lips, rash on face,eyes and head    No current facility-administered medications on file prior to encounter.    Current Outpatient Prescriptions on File Prior to Encounter  Medication Sig  . ADVAIR DISKUS 500-50 MCG/DOSE AEPB 1 PUFF THEN RINSE MOUTH, TWICE DAILY MAINTENANCE  . albuterol (PROVENTIL) (2.5 MG/3ML) 0.083% nebulizer solution INHALE 1 VIAL IN NEBULIZER EVERY 4 HOURS AS NEEDED FOR WHEEZING OR SHORTNESS OF BREATH  . arformoterol (BROVANA) 15 MCG/2ML NEBU Take 2 mLs (15 mcg total) by nebulization 2 (two) times daily.  . ARIPiprazole (ABILIFY) 2 MG tablet Take 2 mg by mouth at bedtime.   . Armodafinil 250 MG tablet Take 250 mg by mouth every morning.  [name] atorvastatin (LIPITOR) 20 MG tablet TAKE 1 TABLET EVERY DAY (Patient taking differently: TAKE 20 MG  EVERY DAY IN THE EVENING)  . ciclopirox (LOPROX) 0.77 % cream Apply topically 2 (two) times daily.  . clonazePAM (KLONOPIN) 1 MG tablet Take 2 mg by mouth at bedtime as needed for  anxiety (or sleep). Reported on 01/16/2016  . dexamethasone (DECADRON) 4 MG tablet 2 tablets by mouth twice a day the day before, day of and day after the chemotherapy every 3  weeks  . dextromethorphan-guaiFENesin (MUCINEX DM) 30-600 MG 12hr tablet Take 1 tablet by mouth 2 (two) times daily as needed for cough (congestion).  [name] esomeprazole (NEXIUM) 40 MG capsule TAKE ONE CAPSULE BY MOUTH EVERY DAY (Patient taking differently: TAKE 40 MG  BY MOUTH TWICE DAILY.)  . feeding supplement, ENSURE ENLIVE, (ENSURE ENLIVE) LIQD Take 237 mLs by mouth 2 (two) times daily between meals.  . fluconazole (DIFLUCAN) 100 MG tablet Take 1 tablet (100 mg total) by mouth daily.  [name] FLUoxetine (PROZAC) 40 MG capsule Take 40 mg by mouth every morning.  . Gabapentin Enacarbil ER (HORIZANT) 300 MG TBCR Take 1 capsule by mouth daily.  [name] KLOR-CON M20 20 MEQ tablet TAKE 1 TABLET BY MOUTH EVERY MORNING  . levofloxacin (LEVAQUIN) 500 MG tablet Take 1 tablet (500 mg total) by mouth daily.  [name] LORazepam (ATIVAN) 0.5 MG tablet Take 1 tablet (0.5 mg total) by mouth 2 (two) times daily as needed for anxiety. Do not take when taking Klonopin.  . Methylnaltrexone Bromide (RELISTOR) 150 MG TABS Take 3 tablets by mouth every morning.  . mirtazapine (REMERON) 45 MG tablet Take 1 tablet (45 mg total) by mouth at bedtime.  [name] morphine (MS CONTIN) 30 MG 12 hr tablet Take 1 tablet (30 mg total) by mouth every 12 (twelve) hours.  [name] morphine (MSIR) 15 MG tablet Take 15 mg by mouth every 8 (eight) hours as needed.  . nitroGLYCERIN (NITROSTAT) 0.4 MG SL tablet Place 1 tablet (0.4 mg total) under the tongue every 5 (five) minutes as needed for chest pain.  [name] ondansetron (ZOFRAN) 8 MG tablet TAKE 1 TABLET (8 MG TOTAL) BY MOUTH EVERY 8 (EIGHT) HOURS AS NEEDED FOR NAUSEA OR VOMITING.  . OXYGEN Inhale 2 L into the lungs daily.   . predniSONE (DELTASONE) 10 MG tablet TAKE 4 X 2 DAYS, 3 X 2 DAYS, 2 X 2 DAYS, 1 X 2 DAYS THEN BACK TO MAINTENANCE DOSE  . predniSONE (DELTASONE) 5 MG tablet Take 5 mg by mouth daily.  . prochlorperazine (COMPAZINE) 10 MG tablet Take 1 tablet (10 mg total) by mouth every 6 (six) hours as needed for  nausea or vomiting.  . theophylline (UNIPHYL) 400 MG 24 hr tablet Take 1 tablet (400 mg total) by mouth daily.  . Tiotropium Bromide Monohydrate (SPIRIVA RESPIMAT) 1.25 MCG/ACT AERS Inhale 2 puffs into the lungs daily.  [name] albuterol (PROVENTIL) (2.5 MG/3ML) 0.083% nebulizer solution INHALE 1 VIAL IN NEBULIZER EVERY 4 HOURS AS NEEDED FOR WHEEZING OR SHORTNESS OF BREATH (Patient not taking: Reported on 11/11/2016)  . arformoterol (BROVANA) 15 MCG/2ML NEBU Take 2 mLs (15 mcg total) by nebulization 2 (two) times daily. (Patient not taking: Reported on 11/11/2016)  . aspirin 81 MG EC tablet TAKE 1 TABLET BY MOUTH EVERY DAY  . indomethacin (INDOCIN) 25 MG capsule Take 1 tablet three times daily with meals x 3 days, then increase to 2 tablet three times daily x 3 days.  . predniSONE (DELTASONE) 10 MG tablet TAKE 4 X 2 DAYS, 3 X 2 DAYS, 2 X 2 DAYS, 1 X 2 DAYS THEN BACK TO MAINTENANCE DOSE (Patient not taking: Reported on 11/11/2016)  . topiramate (TOPAMAX) 25 MG tablet   . VENTOLIN HFA 108 (90 Base) MCG/ACT inhaler INHALE 2 PUFFS INTO THE LUNGS  4 TIMES DAILY AS NEEDED FOR WHEEZING    FAMILY HISTORY:  His indicated that his mother is deceased. He indicated that his father is deceased. He indicated that one of his two sisters is deceased. He indicated that his brother is alive. He indicated that his maternal grandmother is deceased. He indicated that his maternal grandfather is deceased. He indicated that his paternal grandmother is deceased. He indicated that his paternal grandfather is deceased. He indicated that the status of his neg hx is unknown.    SOCIAL HISTORY: He  reports that he quit smoking about 3 years ago. His smoking use included Cigarettes. He smoked 0.00 packs per day for 43.00 years. He has never used smokeless tobacco. He reports that he does not drink alcohol or use drugs.  REVIEW OF SYSTEMS:   Unable to obtain 2/2 intubated state  SUBJECTIVE:    VITAL SIGNS: BP 102/77   Pulse  105   Temp 99.5 F (37.5 C) (Rectal)   Resp 20   Ht '5\' 6"'$  (1.676 m)   SpO2 98%   HEMODYNAMICS:    VENTILATOR SETTINGS: Vent Mode: PRVC FiO2 (%):  [70 %] 70 % Set Rate:  [16 bmp-20 bmp] 20 bmp Vt Set:  [510 mL] 510 mL PEEP:  [5 cmH20] 5 cmH20 Plateau Pressure:  [26 cmH20] 26 cmH20  INTAKE / OUTPUT: No intake/output data recorded.  PHYSICAL EXAMINATION:  General Well nourished, well developed, intubated, sedated  HEENT No gross abnormalities. OETT, OGT in place  Pulmonary Coarse inspiratory phase with prolonged expiratory wheeze throughout. Vent-assisted effort, symmetrical expansion, frequent coughing.   Cardiovascular Normal rate, regular rhythm. S1, s2. No m/r/g. Distal pulses palpable.  Abdomen Soft, non-tender, non-distended, positive bowel sounds, no palpable organomegaly or masses. Normoresonant to percussion.  Musculoskeletal Grossly normal   Lymphatics No cervical, supraclavicular or axillary adenopathy.   Neurologic Grossly intact. No focal deficits.   Skin/Integuement No cyanosis, no clubbing, no edema. Dry skin with scattered nummular lesions     LABS:  BMET  Recent Labs Lab 11/05/16 1110 11/11/16 0340 11/11/16 0352  NA 133* 134* 136  K 4.4 4.6 4.5  CL  --  101 100*  CO2 27 21*  --   BUN 10.'6 10 12  '$ CREATININE 0.7 0.84 0.80  GLUCOSE 114 190* 191*    Electrolytes  Recent Labs Lab 11/05/16 1110 11/11/16 0340  CALCIUM 9.6 8.6*    CBC  Recent Labs Lab 11/05/16 1110 11/11/16 0340 11/11/16 0352  WBC 18.7* 29.5*  --   HGB 12.0* 13.2 14.3  HCT 37.7* 42.1 42.0  PLT 111* 215  --     Coag's No results for input(s): APTT, INR in the last 168 hours.  Sepsis Markers  Recent Labs Lab 11/11/16 0504  LATICACIDVEN 1.40    ABG  Recent Labs Lab 11/11/16 0356  PHART 7.194*  PCO2ART 74.6*  PO2ART 70.0*    Liver Enzymes  Recent Labs Lab 11/05/16 1110  AST 28  ALT 21  ALKPHOS 102  BILITOT 0.48  ALBUMIN 3.3*    Cardiac  Enzymes No results for input(s): TROPONINI, PROBNP in the last 168 hours.  Glucose No results for input(s): GLUCAP in the last 168 hours.  Imaging Dg Chest Portable 1 View  Result Date: 11/11/2016 CLINICAL DATA:  Emergent intubation. Shortness of breath. History of lung cancer. EXAM: PORTABLE CHEST 1 VIEW COMPARISON:  Chest radiograph October 22, 2016 FINDINGS: Endotracheal tube tip projects 3 cm above the carina. Negative nasogastric tube tip and  side-port project in proximal stomach. Multiple masses project in RIGHT lung, and appear larger than prior examination. LEFT apical pleural thickening and scarring. Predominately in RIGHT interstitial prominence. Increased lung volumes. No pleural effusions. No pneumothorax. Mild cardiomegaly. ACDF. Osseous structure nonsuspicious. IMPRESSION: Endotracheal tube tip projects 3 cm above the carina. Nasogastric tube tip projects in proximal stomach. Multiple RIGHT lung/lymphadenopathy masses appear larger though, this could be technical. Interstitial prominence RIGHT lung concerning for infection/postobstructive pneumonia versus acute edema. Electronically Signed   By: Elon Alas M.D.   On: 11/11/2016 04:14     STUDIES:    CULTURES: Blood cx x 2  12/18 >> UA Resp quant 12/18  ANTIBIOTICS: Vancomycin 12/18 >> Zosyn 12/18 >>  SIGNIFICANT EVENTS:   LINES/TUBES: OETT 12/18 OGT 12/18 PIV  DISCUSSION: Ernest [name] is a 13M with multiple comorbidities admitted with what appears to be an acute exacerbation of asthma/COPD. He has wheezing, increased cough and a change in sputum from his baseline. His wife does not report any other infectious symptoms, but it seems reasonable to err on the side of caution given his ongoing chemotherapy and elevated WBC. We will continue antibiotics for now, check a viral panel, continue DuoNebs q 4 with albuterol q2h prn, continue steroids and hope that he will be able to wean from the ventilator.    ASSESSMENT / PLAN:  PULMONARY A: Acute hypoxemic/hypercapneic respiratory failure COPD/asthma exacerbation Stage IV NSCLC P:   Continue ventilatory support DuoNebs q 4h scheduled Albuterol q2h prn Prednisone '40mg'$  daily Received vanc/zosyn in the ed  CARDIOVASCULAR A:  No acute issues Hx CAD P:  Monitor  RENAL A:   No acute issues P:   Trend BMP Monitor [facility] to adjust dosing as necessary  GASTROINTESTINAL A:   No acute issues P:   pepcid for gi ppx  HEMATOLOGIC A:   Leukocytosis P:  Trend CBC Continue Abx  INFECTIOUS A:   Concern for sepsis 2/2 pneumonia P:   Received vanc/zosyn in the ED; low threshold to de-escalate Check viral panel, tracheal aspirate Follow blood cultures  ENDOCRINE A:   DMII   P:   CBG / SSI  NEUROLOGIC A:   No acute issues P:   RASS goal: 0   FAMILY  - Updates: Wife at bedside. While he would not want to be maintained indefinitely on life support, he desires to remain FULL CODE at this time.   - Inter-disciplinary family meet or Palliative Care meeting due by:  day 7  The patient is critically ill with multiple organ system failure and requires high complexity decision making for assessment and support, frequent evaluation and titration of therapies, advanced monitoring, review of radiographic studies and interpretation of complex data.   Critical Care Time devoted to patient care services, exclusive of separately billable procedures, described in this note is 42 minutes.   Yisroel Ramming, MD Pulmonary and [place] Pager: [phone]  11/11/2016, 5:13 AM

## 2016-11-11 NOTE — Progress Notes (Signed)
Critical ABG results given to Pryor Curia, MD. RR increased from 16 to 20 on vent per verbal order.

## 2016-11-11 NOTE — ED Provider Notes (Signed)
By signing my name below, I, Dolores Hoose, attest that this documentation has been prepared under the direction and in the presence of Salmon Brook, DO . Electronically Signed: Dolores Hoose, Scribe. 11/11/2016. 3:00 AM.  TIME SEEN: 3:01AM  CHIEF COMPLAINT: Shortness of Breath  HPI: LEVEL 5 CAVEAT DUE TO ACUITY OF CONDITION  Ernest [name] is a 60 y.o. male with pmhx of COPD, CAD, DM, HLD, HTN and lung CA who presents to the Emergency Department by EMS with sudden-onset constant unchanged SOB beginning upon waking up earlier tonight. Per ems, pt was initially responsive with his male partner but has been unresponsive, even to painful stimuli, since EMS arrived on site. Called out for shortness of breath. On their arrival patient was hypoxic. They note the pt has been on a continuous neb since arrival and has had 2 g of IV magnesium, 125 mg IV solumedrol, 10 mg of albuterol and 0.3 mg IM Epinephrine.  ROS:  LEVEL 5 CAVEAT DUE TO ACUITY OF CONDITION  PAST MEDICAL HISTORY/PAST SURGICAL HISTORY:  Past Medical History:  Diagnosis Date  . Acute bronchitis 11/05/2016  . Anxiety   . CAD (coronary artery disease)    Left Main 30% stenosis, LAD 20 - 30 % stenosis, first and second diagonal branchesat 40 - 50%  stenosis with small arteries, circumflex had 30% stenosis in the large obtuse marginal, RCA at 70 - 80%  stenosis [not felt to be occlusive after evaluation with flow wire], distal 50 - 60% stenosis - [name][  . Chronic insomnia   . COPD with asthma ([place]) 09/08/2007  . DDD (degenerative disc disease)   . Depression   . Diabetes mellitus without complication ([place]) 9/76/7341   no medications now 07/24/2016  . GERD (gastroesophageal reflux disease)   . Gout   . History of cardiovascular stress test    Myoview 7/16:  Diaphragmatic attenuation, no ischemia, EF 56%; Low Risk  . History of radiation therapy 11/10/13- 12/29/13   left lung 6600 cGy in 33 sessions  . Hx of  colonoscopy   . Hyperlipidemia   . Hypertension    dr Percival Spanish  . Itching due to drug 03/19/2016  . Lung cancer ([place]) 10/04/13   LUL squamous cell lung cancer  . Nausea with vomiting 03/26/2016  . OSA (obstructive sleep apnea)    NPSG 09/10/10- AHI 11.3/hr  . PVD (peripheral vascular disease) ([place])    PTA/Stent right common iliac    MEDICATIONS:  Prior to Admission medications   Medication Sig Start Date End Date Taking? Authorizing Provider  ADVAIR DISKUS 500-50 MCG/DOSE AEPB 1 PUFF THEN RINSE MOUTH, TWICE DAILY MAINTENANCE 04/26/16   Deneise Lever, MD  albuterol (PROVENTIL) (2.5 MG/3ML) 0.083% nebulizer solution INHALE 1 VIAL IN NEBULIZER EVERY 4 HOURS AS NEEDED FOR WHEEZING OR SHORTNESS OF BREATH 10/09/16   Deneise Lever, MD  albuterol (PROVENTIL) (2.5 MG/3ML) 0.083% nebulizer solution INHALE 1 VIAL IN NEBULIZER EVERY 4 HOURS AS NEEDED FOR WHEEZING OR SHORTNESS OF BREATH 11/08/16   Deneise Lever, MD  arformoterol (BROVANA) 15 MCG/2ML NEBU Take 2 mLs (15 mcg total) by nebulization 2 (two) times daily. 11/06/16   Deneise Lever, MD  arformoterol (BROVANA) 15 MCG/2ML NEBU Take 2 mLs (15 mcg total) by nebulization 2 (two) times daily. 11/08/16   Deneise Lever, MD  ARIPiprazole (ABILIFY) 2 MG tablet Take 2 mg by mouth at bedtime.  05/22/16   Historical Provider, MD  Armodafinil 250 MG tablet Take 250  mg by mouth every morning. 03/08/16   Historical Provider, MD  aspirin 81 MG EC tablet TAKE 1 TABLET BY MOUTH EVERY DAY 07/07/16   Janith Lima, MD  atorvastatin (LIPITOR) 20 MG tablet TAKE 1 TABLET EVERY DAY Patient taking differently: TAKE 20 MG  EVERY DAY IN THE EVENING 08/01/15   Dorena Cookey, MD  ciclopirox (LOPROX) 0.77 % cream Apply topically 2 (two) times daily. 10/31/16   Janith Lima, MD  clonazePAM (KLONOPIN) 1 MG tablet Take 2 mg by mouth at bedtime as needed for anxiety. Reported on 01/16/2016    Historical Provider, MD  dexamethasone (DECADRON) 4 MG tablet 2 tablets by mouth  twice a day the day before, day of and day after the chemotherapy every 3 weeks 09/17/16   Curt Bears, MD  dextromethorphan-guaiFENesin [facility] DM) 30-600 MG 12hr tablet Take 1 tablet by mouth 2 (two) times daily as needed for cough (congestion).    Historical Provider, MD  esomeprazole (NEXIUM) 40 MG capsule TAKE ONE CAPSULE BY MOUTH EVERY DAY Patient taking differently: TAKE 40 MG  BY MOUTH TWICE DAILY. 05/06/16   Janith Lima, MD  feeding supplement, ENSURE ENLIVE, (ENSURE ENLIVE) LIQD Take 237 mLs by mouth 2 (two) times daily between meals. 05/23/16   Nishant Dhungel, MD  fluconazole (DIFLUCAN) 100 MG tablet Take 1 tablet (100 mg total) by mouth daily. 10/22/16   Susanne Borders, NP  FLUoxetine (PROZAC) 40 MG capsule Take 40 mg by mouth every morning.    Historical Provider, MD  Gabapentin Enacarbil ER (HORIZANT) 300 MG TBCR Take 1 capsule by mouth daily. 09/13/16   Janith Lima, MD  indomethacin (INDOCIN) 25 MG capsule Take 1 tablet three times daily with meals x 3 days, then increase to 2 tablet three times daily x 3 days. 09/09/16   Donika K Patel, DO  KLOR-CON M20 20 MEQ tablet TAKE 1 TABLET BY MOUTH EVERY MORNING 07/22/16   Janith Lima, MD  levofloxacin (LEVAQUIN) 500 MG tablet Take 1 tablet (500 mg total) by mouth daily. 11/05/16   Curt Bears, MD  LORazepam (ATIVAN) 0.5 MG tablet Take 1 tablet (0.5 mg total) by mouth 2 (two) times daily as needed for anxiety. Do not take when taking Klonopin. 10/22/16   Susanne Borders, NP  Methylnaltrexone Bromide (RELISTOR) 150 MG TABS Take 3 tablets by mouth every morning. 07/18/16   Janith Lima, MD  mirtazapine (REMERON) 45 MG tablet Take 1 tablet (45 mg total) by mouth at bedtime. 12/29/14   Tammy S Parrett, NP  morphine (MS CONTIN) 30 MG 12 hr tablet Take 1 tablet (30 mg total) by mouth every 12 (twelve) hours. 09/24/16   Janith Lima, MD  morphine (MSIR) 15 MG tablet Take 15 mg by mouth every 8 (eight) hours as needed. 10/31/16    Historical Provider, MD  nitroGLYCERIN (NITROSTAT) 0.4 MG SL tablet Place 1 tablet (0.4 mg total) under the tongue every 5 (five) minutes as needed for chest pain. 08/09/14   Minus Breeding, MD  ondansetron (ZOFRAN) 8 MG tablet TAKE 1 TABLET (8 MG TOTAL) BY MOUTH EVERY 8 (EIGHT) HOURS AS NEEDED FOR NAUSEA OR VOMITING. 06/20/16   Curt Bears, MD  OXYGEN Inhale 2 L into the lungs daily.     Historical Provider, MD  predniSONE (DELTASONE) 10 MG tablet TAKE 4 X 2 DAYS, 3 X 2 DAYS, 2 X 2 DAYS, 1 X 2 DAYS THEN BACK TO MAINTENANCE DOSE 09/19/16  Historical Provider, MD  predniSONE (DELTASONE) 10 MG tablet TAKE 4 X 2 DAYS, 3 X 2 DAYS, 2 X 2 DAYS, 1 X 2 DAYS THEN BACK TO MAINTENANCE DOSE 11/01/16   Deneise Lever, MD  predniSONE (DELTASONE) 5 MG tablet Take 5 mg by mouth daily. 11/01/16   Historical Provider, MD  prochlorperazine (COMPAZINE) 10 MG tablet Take 1 tablet (10 mg total) by mouth every 6 (six) hours as needed for nausea or vomiting. 10/22/16   Curt Bears, MD  theophylline (UNIPHYL) 400 MG 24 hr tablet Take 1 tablet (400 mg total) by mouth daily. 08/12/16   Deneise Lever, MD  Tiotropium Bromide Monohydrate (SPIRIVA RESPIMAT) 1.25 MCG/ACT AERS Inhale 2 puffs into the lungs daily. 04/16/16   Janith Lima, MD  topiramate (TOPAMAX) 25 MG tablet  10/16/16   Historical Provider, MD  VENTOLIN HFA 108 (90 Base) MCG/ACT inhaler INHALE 2 PUFFS INTO THE LUNGS 4 TIMES DAILY AS NEEDED FOR WHEEZING 06/03/16   Deneise Lever, MD    ALLERGIES:  Allergies  Allergen Reactions  . Carboplatin Shortness Of Breath, Swelling and Rash    Swelling of lips, rash on face,eyes and head    SOCIAL HISTORY:  Social History  Substance Use Topics  . Smoking status: Former Smoker    Packs/day: 0.00    Years: 43.00    Types: Cigarettes    Quit date: 09/23/2013  . Smokeless tobacco: Never Used     Comment: history of 3 PPD, 11/02/13   . Alcohol use No    FAMILY HISTORY: Family History  Problem Relation Age  of Onset  . Heart attack Mother   . Heart attack Sister   . Lung cancer Sister   . Cancer Sister     small cell lung, mets to brain  . Hypertension Brother   . Emphysema Sister   . Stroke Neg Hx     EXAM: BP 98/70   Pulse 102   Temp 99.5 F (37.5 C) (Rectal)   Resp 20   Ht '5\' 6"'$  (1.676 m)   Wt 170 lb 13.7 oz (77.5 kg)   SpO2 100%   BMI 27.58 kg/m  CONSTITUTIONAL: GCS 3, chronically ill-appearing, afebrile rectal temperature is 99.5 HEAD: Normocephalic EYES: Conjunctivae clear, PERRL, EOMI ENT: normal nose; no rhinorrhea; moist mucous membranes NECK: Supple, no meningismus, no nuchal rigidity, no LAD  CARD: Regular and tachycardic; S1 and S2 appreciated; no murmurs, no clicks, no rubs, no gallops RESP: Hypoxic on room air, being bagged by EMS, diminished aeration diffusely, wheezing, no rhonchi or rales, tachypneic ABD/GI: Normal bowel sounds; non-distended; soft, non-tender, no rebound, no guarding, no peritoneal signs, no hepatosplenomegaly BACK:  The back appears normal and is non-tender to palpation, there is no CVA tenderness EXT: Normal ROM in all joints; non-tender to palpation; no edema; normal capillary refill; no cyanosis, no calf tenderness or swelling    SKIN: Normal color for age and race; warm; no rash NEURO: GCS 3  MEDICAL DECISION MAKING: Patient here with altered mental status, respiratory failure. Intubated immediately upon arrival for airway protection and respiratory failure. Suspect COPD exacerbation the patient also has lung cancer and getting chemotherapy therefore HCAP and PE are also in the differential.  Will obtain labs, chest x-ray, EKG. We'll give continuous albuterol, Atrovent. He will need admission to critical care.  ED PROGRESS: ABG shows respiratory acidosis. We will increase his rate.   Labs show leukocytosis with left shift. This does appear to be chronic  for patient but worse than normal. Troponin negative.  Lactate normal. BNP negative.  D-dimer elevated. Will obtain a CT of his chest. Chest x-ray shows multiple right lung masses that appear larger and interstitial prominence in the right lung concerning for infection versus acute edema. I think this is more likely infectious in nature given increasing leukocytosis and temperature of 99.5. His wife does report that he has been coughing more over the past 24 hours and he is on chemotherapy. Will give broad-spectrum antibiotics, IV fluids.  4:10 AM  D/w Dr. Vaughan Browner with critical care. They will see the patient for admission.     EKG Interpretation  Date/Time:  Monday November 11 2016 03:05:56 EST Ventricular Rate:  116 PR Interval:    QRS Duration: 72 QT Interval:  296 QTC Calculation: 412 R Axis:   108 Text Interpretation:  Sinus tachycardia Ventricular premature complex Anterior infarct, old Artifact in lead(s) V4 No significant change since last tracing Confirmed by Dalisha Shively,  DO, Jaydan Meidinger ([id number]) on 11/11/2016 3:16:26 AM         INTUBATION Performed by: Nyra Jabs  Required items: required blood products, implants, devices, and special equipment available Patient identity confirmed: provided demographic data and hospital-assigned identification number Time out: Immediately prior to procedure a "time out" was called to verify the correct patient, procedure, equipment, support staff and site/side marked as required.  Indications: Airway protection, respiratory failure   Intubation method: Glidescope Laryngoscopy   Preoxygenation: BVM  Sedatives: 20 mg IV Etomidate Paralytic: 100 mg IV Succinylcholine  Tube Size: 7.5 cuffed  Post-procedure assessment: chest rise and ETCO2 monitor Breath sounds: equal and absent over the epigastrium Tube secured with: ETT holder Chest x-ray interpreted by radiologist and me.  Chest x-ray findings: endotracheal tube in appropriate position  Patient tolerated the procedure well with no immediate  complications.      CRITICAL CARE Performed by: Nyra Jabs   Total critical care time: 60 minutes  Critical care time was exclusive of separately billable procedures and treating other patients.  Critical care was necessary to treat or prevent imminent or life-threatening deterioration.  Critical care was time spent personally by me on the following activities: development of treatment plan with patient and/or surrogate as well as nursing, discussions with consultants, evaluation of patient's response to treatment, examination of patient, obtaining history from patient or surrogate, ordering and performing treatments and interventions, ordering and review of laboratory studies, ordering and review of radiographic studies, pulse oximetry and re-evaluation of patient's condition.    I personally performed the services described in this documentation, which was scribed in my presence. The recorded information has been reviewed and is accurate.    [place], DO 11/11/16 [phone]

## 2016-11-11 NOTE — Progress Notes (Signed)
Pharmacy Antibiotic Note  Ernest [name] is a 60 y.o. male with lung cancer, on chemotherapy, admitted on 11/11/2016 with acute respiratory failure and leukocytosis (wbc 29.5K). Pharmacy has been consulted for vanc/zosyn dosing Scr 0.8, est. crcl ~ 95 ml/min. He received 1 dose vanc/zosyn at 0530 in the ED.  Plan: Vancomycin 750 mg IV Q 8 hrs, next dose due 1400 Zosyn 3.375g IV Q 8hrs Monitor renal function and f/u cultures  Height: '5\' 6"'$  (167.6 cm) Weight: 170 lb 13.7 oz (77.5 kg) IBW/kg (Calculated) : 63.8  Temp (24hrs), Avg:99 F (37.2 C), Min:98.7 F (37.1 C), Max:99.5 F (37.5 C)   Recent Labs Lab 11/05/16 1110 11/11/16 0340 11/11/16 0352 11/11/16 0504  WBC 18.7* 29.5*  --   --   CREATININE 0.7 0.84 0.80  --   LATICACIDVEN  --   --   --  1.40    Estimated Creatinine Clearance: 95 mL/min (by C-G formula based on SCr of 0.8 mg/dL).    Allergies  Allergen Reactions  . Carboplatin Shortness Of Breath, Swelling and Rash    Swelling of lips, rash on face,eyes and head    Antimicrobials this admission: Vancomycin 12/18  >>  Zosyn 12/18 >>   Dose adjustments this admission:   Microbiology results: 12/18 BCx:  12/18 Sputum:  12/18 MRSA PCR: neg  Thank you for allowing pharmacy to be a part of this patient's care.  Maryanna Shape, PharmD, BCPS  Clinical Pharmacist  Pager: [phone]   11/11/2016 1:28 PM

## 2016-11-11 NOTE — Progress Notes (Signed)
[facility] Progress Note Patient Name: SACHA RADLOFF DOB: [birth year] MRN: [id number]   Date of Service  11/11/2016  HPI/Events of Note  CBGs high On TFs + steroids  eICU Interventions  SSI - resistant May need lantus     Intervention Category Intermediate Interventions: Hyperglycemia - evaluation and treatment  Orene Abbasi V. 11/11/2016, 5:27 PM

## 2016-11-11 NOTE — Plan of Care (Signed)
Problem: Nutritional: Goal: Intake of prescribed amount of daily calories will improve Outcome: Progressing TF Started

## 2016-11-11 NOTE — ED Notes (Signed)
Troponin 0.08 reported to Dr. Leonides Schanz

## 2016-11-11 NOTE — Progress Notes (Signed)
11/11/2016 0940 Phlebotomy notified of need for LAB draw for Lactic Acid.  Arney Mayabb, Arville Lime

## 2016-11-11 NOTE — Progress Notes (Addendum)
Initial Nutrition Assessment  DOCUMENTATION CODES:   Not applicable  INTERVENTION:    Initiate Vital AF 1.2 at goal rate of 35 ml/h (840 ml per day) and Prostat 30 ml TID    Liquid MVI daily to help meet RDI's  TF regimen + current Propofol to provide 1702 kcals, 108 gm protein, 681 ml of free water  NUTRITION DIAGNOSIS:   Inadequate oral intake related to inability to eat as evidenced by NPO status  GOAL:   Patient will meet greater than or equal to 90% of their needs  MONITOR:   Vent status, TF tolerance, Labs, Weight trends, Skin, I & O's  REASON FOR ASSESSMENT:   Consult Enteral/tube feeding initiation and management  ASSESSMENT:   60 yo Male with PMH significant for COPD, 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  EMS was called and found him minimally responsive.     Patient is currently intubated on ventilator support >> OGT in place MV: 9.4 L/min Temp (24hrs), Avg:99 F (37.2 C), Min:98.7 F (37.1 C), Max:99.5 F (37.5 C)  Propofol: 18.7 ml/hr >> 494 fat kcals   Pt with acute hypoxemic/hypercapneic respiratory failure. Suspected COPD/asthma exacerbation. Concern for sepsis due to PNA. Labs and medications reviewed. CBG's 223-183.  Diet Order:  Diet NPO time specified  Skin:  Reviewed, no issues  Last BM:  12/17  Height:   Ht Readings from Last 1 Encounters:  11/11/16 '5\' 6"'$  (1.676 m)    Weight:   Wt Readings from Last 1 Encounters:  11/11/16 170 lb 13.7 oz (77.5 kg)    Ideal Body Weight:  64.5 kg  BMI:  Body mass index is 27.58 kg/m.  Estimated Nutritional Needs:   Kcal:  1800  Protein:  100-110 gm  Fluid:  per MD  EDUCATION NEEDS:   No education needs identified at this time  Arthur Holms, RD, LDN Pager #: [phone] After-Hours Pager #: [phone]

## 2016-11-11 NOTE — Progress Notes (Signed)
11/11/2016 1045 Nursing note Pt. With condom cath with no spontaneous void since admit. Bladder scan performed showing 700 ml urine in bladder. Dr. Nelda Marseille on floor and made aware. Verbal order received to place foley. Orders enacted. 790 ml clear yellow urine removed from bladder post placement.  Ernest [name], Arville Lime

## 2016-11-11 NOTE — Progress Notes (Signed)
CRITICAL VALUE ALERT  Critical value received:  Lactic Acid 3.4  Date of notification:  11/11/2016   Time of notification:  1430  Critical value read back: YES   Nurse who received alert:  Ivey Nembhard, Arville Lime   MD notified (1st page):  Fatima Blank   Time of first page:  73   MD notified (2nd page):  Time of second page:  Responding MD:  Fatima Blank.   Time MD responded:  [id number]

## 2016-11-11 NOTE — ED Notes (Signed)
CCM at bedside 

## 2016-11-11 NOTE — Care Management Note (Signed)
Case Management Note  Patient Details  Name: Ernest [name] MRN: [id number] Date of Birth: 1956/08/09  Subjective/Objective:    Pt admitted with SOB - recent dx of lung cancer                Action/Plan:  PTA from home with wife.  CM will continue to monitor for discharge needs   Expected Discharge Date:                  Expected Discharge Plan:  Home/Self Care  In-House Referral:     Discharge planning Services  CM Consult  Post Acute Care Choice:    Choice offered to:     DME Arranged:    DME Agency:     HH Arranged:    HH Agency:     Status of Service:  In process, will continue to follow  If discussed at Long Length of Stay Meetings, dates discussed:    Additional Comments:  Maryclare Labrador, RN 11/11/2016, 2:59 PM

## 2016-11-11 NOTE — Telephone Encounter (Signed)
Mixed response. We can discuss after discharge or inpatient doctor can discuss with him.

## 2016-11-11 NOTE — ED Triage Notes (Addendum)
Pt to ED by [facility] c/o shortness of breath onset yesterday. Woke up this morning with sudden onset SOB. Pt unresponsive on ems arrival, assisted ventilations by bvm, npa placed. Pt given 0.'3mg'$  epi, 125 mg solumedtol, 2g mag sulfate, 0.'5mg'$  atrovent, '10mg'$  albuterol. EMS reported GCS of 9. Pt with assisted ventilations on arrival. EDP at bedside for intubation

## 2016-11-11 NOTE — Progress Notes (Signed)
Sputum sample obtained and sent down to main lab without complications.  

## 2016-11-12 ENCOUNTER — Ambulatory Visit: Payer: Commercial Managed Care - HMO

## 2016-11-12 ENCOUNTER — Inpatient Hospital Stay (HOSPITAL_COMMUNITY): Payer: Commercial Managed Care - HMO

## 2016-11-12 ENCOUNTER — Other Ambulatory Visit: Payer: Commercial Managed Care - HMO

## 2016-11-12 DIAGNOSIS — J9601 Acute respiratory failure with hypoxia: Secondary | ICD-10-CM

## 2016-11-12 DIAGNOSIS — Z7189 Other specified counseling: Secondary | ICD-10-CM

## 2016-11-12 DIAGNOSIS — C3491 Malignant neoplasm of unspecified part of right bronchus or lung: Secondary | ICD-10-CM

## 2016-11-12 DIAGNOSIS — J9602 Acute respiratory failure with hypercapnia: Secondary | ICD-10-CM

## 2016-11-12 DIAGNOSIS — J189 Pneumonia, unspecified organism: Secondary | ICD-10-CM

## 2016-11-12 LAB — BLOOD GAS, ARTERIAL
ACID-BASE EXCESS: 4.2 mmol/L — AB (ref 0.0–2.0)
Bicarbonate: 28.6 mmol/L — ABNORMAL HIGH (ref 20.0–28.0)
DRAWN BY: 44166
FIO2: 40
MECHVT: 510 mL
O2 SAT: 98 %
PATIENT TEMPERATURE: 99.3
PCO2 ART: 47.1 mmHg (ref 32.0–48.0)
PEEP/CPAP: 5 cmH2O
PH ART: 7.403 (ref 7.350–7.450)
PO2 ART: 112 mmHg — AB (ref 83.0–108.0)
RATE: 20 resp/min

## 2016-11-12 LAB — BASIC METABOLIC PANEL
Anion gap: 10 (ref 5–15)
BUN: 21 mg/dL — AB (ref 6–20)
CALCIUM: 8.8 mg/dL — AB (ref 8.9–10.3)
CHLORIDE: 97 mmol/L — AB (ref 101–111)
CO2: 27 mmol/L (ref 22–32)
CREATININE: 0.83 mg/dL (ref 0.61–1.24)
Glucose, Bld: 148 mg/dL — ABNORMAL HIGH (ref 65–99)
Potassium: 4.5 mmol/L (ref 3.5–5.1)
SODIUM: 134 mmol/L — AB (ref 135–145)

## 2016-11-12 LAB — GLUCOSE, CAPILLARY
GLUCOSE-CAPILLARY: 172 mg/dL — AB (ref 65–99)
GLUCOSE-CAPILLARY: 116 mg/dL — AB (ref 65–99)
GLUCOSE-CAPILLARY: 145 mg/dL — AB (ref 65–99)
Glucose-Capillary: 122 mg/dL — ABNORMAL HIGH (ref 65–99)
Glucose-Capillary: 128 mg/dL — ABNORMAL HIGH (ref 65–99)
Glucose-Capillary: 168 mg/dL — ABNORMAL HIGH (ref 65–99)

## 2016-11-12 LAB — CBC
HCT: 39 % (ref 39.0–52.0)
HEMOGLOBIN: 12.2 g/dL — AB (ref 13.0–17.0)
MCH: 28.3 pg (ref 26.0–34.0)
MCHC: 31.3 g/dL (ref 30.0–36.0)
MCV: 90.5 fL (ref 78.0–100.0)
PLATELETS: 141 10*3/uL — AB (ref 150–400)
RBC: 4.31 MIL/uL (ref 4.22–5.81)
RDW: 18.7 % — AB (ref 11.5–15.5)
WBC: 16.3 10*3/uL — ABNORMAL HIGH (ref 4.0–10.5)

## 2016-11-12 LAB — MAGNESIUM: Magnesium: 2.2 mg/dL (ref 1.7–2.4)

## 2016-11-12 LAB — PHOSPHORUS: Phosphorus: 2.4 mg/dL — ABNORMAL LOW (ref 2.5–4.6)

## 2016-11-12 MED ORDER — FUROSEMIDE 10 MG/ML IJ SOLN
20.0000 mg | Freq: Four times a day (QID) | INTRAMUSCULAR | Status: AC
  Administered 2016-11-12 (×3): 20 mg via INTRAVENOUS
  Filled 2016-11-12 (×3): qty 2

## 2016-11-12 NOTE — Progress Notes (Signed)
PULMONARY / CRITICAL CARE MEDICINE   Name: Ernest [name] MRN: [id number] DOB: 22-Dec-1955    ADMISSION DATE:  11/11/2016 CONSULTATION DATE:   REFERRING MD:  EDP - WARD  CHIEF COMPLAINT:  Shortness of breath  HISTORY OF PRESENT ILLNESS:   Mr. Weidler is a 22M with PMH significant for COPD, stage IV NSCLC on active chemo with docetaxel and Cyramza, DMII, CAD, HTN, H LD, OSA and PVD, who presents with acute onset shortness of breath that awakened him from sleep and was quite severe. He has been more short of breath than usual for a few days. This was accompanied by congestion. No fever / chills / diarrhea / nausea / vomiting. His wife reports he felt like he had phlegm, but couldn't get it up. No hemoptysis. He was treating his congestion with OTC mucinex and had increased his rescue albuterol use. He went to bed doing okay (he always has to sleep sitting up), but then awoke around 3am and needed to go to the hospital. EMS was called and found him minimally responsive. His respirations were assisted with BVM en route. Once in the ED, he was intubated.   Labs showed elevated WBC (which seems chronic for him and may be chemo related), normal renal function, normal BNP, normal troponin, normal lactate. ABG was consistent with a respiratory acidosis. CXR with perhaps some interval enlargement in chronic right sided findings as well as interstitial prominence concerning for possible infection/postobstructive pneumonia vs acute edema.  Since being intubated, his mental status has improved. He is arousable and briskly follows commands. He denies pain.   SUBJECTIVE:  No events overnight, failed wean this AM  VITAL SIGNS: BP 118/67   Pulse 94   Temp 99 F (37.2 C) (Oral)   Resp (!) 24   Ht '5\' 6"'$  (1.676 m)   Wt 76.7 kg (169 lb 1.5 oz)   SpO2 100%   BMI 27.29 kg/m   HEMODYNAMICS:    VENTILATOR SETTINGS: Vent Mode: PRVC FiO2 (%):  [40 %] 40 % Set Rate:  [20 bmp] 20 bmp Vt Set:   [510 mL] 510 mL PEEP:  [5 cmH20] 5 cmH20 Pressure Support:  [8 cmH20] 8 cmH20 Plateau Pressure:  [15 cmH20-19 cmH20] 16 cmH20  INTAKE / OUTPUT: I/O last 3 completed shifts: In: 3957.5 [I.V.:1655; NG/GT:640; IV Piggyback:1662.5] Out: 2655 [Urine:1705; Emesis/NG output:950]  PHYSICAL EXAMINATION:  General Well nourished, well developed, intubated, awake and interactive this AM  HEENT No gross abnormalities. OETT, OGT in place  Pulmonary Coarse inspiratory phase with prolonged expiratory wheeze throughout. Vent-assisted effort, symmetrical expansion, frequent coughing.   Cardiovascular Normal rate, regular rhythm. S1, s2. No m/r/g. Distal pulses palpable.  Abdomen Soft, NT, ND and +BS  Musculoskeletal Grossly normal   Lymphatics No cervical, supraclavicular or axillary adenopathy.   Neurologic Grossly intact. No focal deficits.   Skin/Integuement No cyanosis, no clubbing, no edema. Dry skin with scattered nummular lesions   LABS:  BMET  Recent Labs Lab 11/05/16 1110 11/11/16 0340 11/11/16 0352 11/12/16 0231  NA 133* 134* 136 134*  K 4.4 4.6 4.5 4.5  CL  --  101 100* 97*  CO2 27 21*  --  27  BUN 10.'6 10 12 '$ 21*  CREATININE 0.7 0.84 0.80 0.83  GLUCOSE 114 190* 191* 148*   Electrolytes  Recent Labs Lab 11/05/16 1110 11/11/16 0340 11/12/16 0231  CALCIUM 9.6 8.6* 8.8*  MG  --   --  2.2  PHOS  --   --  2.4*   CBC  Recent Labs Lab 11/05/16 1110 11/11/16 0340 11/11/16 0352 11/12/16 0231  WBC 18.7* 29.5*  --  16.3*  HGB 12.0* 13.2 14.3 12.2*  HCT 37.7* 42.1 42.0 39.0  PLT 111* 215  --  141*   Coag's No results for input(s): APTT, INR in the last 168 hours.  Sepsis Markers  Recent Labs Lab 11/11/16 0504 11/11/16 1349 11/11/16 1926  LATICACIDVEN 1.40 3.4* 2.8*   ABG  Recent Labs Lab 11/11/16 0356 11/12/16 0342  PHART 7.194* 7.403  PCO2ART 74.6* 47.1  PO2ART 70.0* 112*   Liver Enzymes  Recent Labs Lab 11/05/16 1110  AST 28  ALT 21  ALKPHOS  102  BILITOT 0.48  ALBUMIN 3.3*   Cardiac Enzymes No results for input(s): TROPONINI, PROBNP in the last 168 hours.  Glucose  Recent Labs Lab 11/11/16 1148 11/11/16 1644 11/11/16 2005 11/11/16 2330 11/12/16 0405 11/12/16 0754  GLUCAP 183* 299* 160* 148* 172* 168*   Imaging Dg Chest Port 1 View  Result Date: 11/12/2016 CLINICAL DATA:  Endotracheal tube placement. EXAM: PORTABLE CHEST 1 VIEW COMPARISON:  Radiographs of November 11, 2016. FINDINGS: Endotracheal and nasogastric tubes are unchanged in position. Stable apical pleural thickening and scarring is noted. No pneumothorax is noted. Stable right peritracheal mass and right lower lobe mass is noted. Mild interstitial densities are noted throughout right lung concerning for edema or possibly inflammation. Bony thorax is unremarkable. IMPRESSION: Stable support apparatus. Stable right peritracheal mass and right lower lobe mass consistent with malignancy. Mild interstitial densities are noted throughout right lung concerning for edema or possibly inflammation. Electronically Signed   By: Marijo Conception, M.D.   On: 11/12/2016 08:12     STUDIES:    CULTURES: Blood cx x 2  12/18 >>NTD UA 12/18>>>NTD Resp quant 12/18>>>NTD RVP>>>NTD  ANTIBIOTICS: Vancomycin 12/18 >> Zosyn 12/18 >>  SIGNIFICANT EVENTS:   LINES/TUBES: OETT 12/18 OGT 12/18 PIV  DISCUSSION: Mr. Plascencia is a 51M with multiple comorbidities admitted with what appears to be an acute exacerbation of asthma/COPD. He has wheezing, increased cough and a change in sputum from his baseline. His wife does not report any other infectious symptoms, but it seems reasonable to err on the side of caution given his ongoing chemotherapy and elevated WBC. We will continue antibiotics for now, check a viral panel, continue DuoNebs q 4 with albuterol q2h prn, continue steroids and hope that he will be able to wean from the ventilator.   ASSESSMENT /  PLAN:  PULMONARY A: Acute hypoxemic/hypercapneic respiratory failure COPD/asthma exacerbation Stage IV NSCLC P:   Continue full ventilatory support Decrease RR to 16 DuoNebs q 4h scheduled Albuterol q2h prn Change prednisone to IV solumedrol Continue vanc/zosyn  CARDIOVASCULAR A:  No acute issues Hx CAD P:  Tele monitor  RENAL A:   No acute issues P:   Trend BMP Monitor [facility] to adjust dosing as necessary KVO IVF Gentle diureses today.  GASTROINTESTINAL A:   No acute issues P:   Pepcid for gi ppx TF per nutrition  HEMATOLOGIC A:   Leukocytosis P:  Trend CBC Continue Abx: vanc/zosyn.  INFECTIOUS A:   Concern for sepsis 2/2 pneumonia P:   Continue vanc/zosyn til cultures RVP negative, d/c isolation Follow blood cultures Procalcitonin protocol <0.10 but infiltrate noted on CT  ENDOCRINE A:   DMII   P:   CBG / SSI  NEUROLOGIC A:   No acute issues P:   RASS goal: 0 PRN versed  and a fentanyl drip.  FAMILY  - Updates: Called wife x2 for updates but straight to voice mail.  - Inter-disciplinary family meet or Palliative Care meeting due by:  day 7  The patient is critically ill with multiple organ systems failure and requires high complexity decision making for assessment and support, frequent evaluation and titration of therapies, application of advanced monitoring technologies and extensive interpretation of multiple databases.   Critical Care Time devoted to patient care services described in this note is  45  Minutes. This time reflects time of care of this signee Dr Jennet Maduro. This critical care time does not reflect procedure time, or teaching time or supervisory time of PA/NP/Med student/Med Resident etc but could involve care discussion time.  Rush Farmer, M.D. [facility] Pulmonary/Critical Care Medicine. Pager: [phone]. After hours pager: [phone].  11/12/2016, 8:52 AM

## 2016-11-12 NOTE — Consult Note (Addendum)
   [facility] CM Inpatient Consult   11/12/2016  Ernest [name] Apr 07, 1956 [id number]   Ernest [name] screened for potential [facility] Care Management services. He is currently on vent in ICU. Ernest [name] is active with [facility], a home based palliative care program administered by [facility]. Telephone call made to [facility] at [phone]. Spoke with Tammy who confirms that [facility] is still following Ernest [name]. Also discussed that Ernest [name] and wife could benefit from goals of care during hospitalization. Made inpatient RNCM aware of above.    Marthenia Rolling, MSN-Ed, RN,BSN [facility] Liaison [phone]

## 2016-11-12 NOTE — Care Management Note (Addendum)
Case Management Note  Patient Details  Name: Ernest [name] MRN: [id number] Date of Birth: 07-09-1956  Subjective/Objective:    Pt admitted with SOB - recent dx of lung cancer                Action/Plan:  PTA from home with wife.  CM will continue to monitor for discharge needs   Expected Discharge Date:                  Expected Discharge Plan:  Home/Self Care  In-House Referral:     Discharge planning Services  CM Consult  Post Acute Care Choice:    Choice offered to:     DME Arranged:    DME Agency:     HH Arranged:    HH Agency:     Status of Service:  In process, will continue to follow  If discussed at Long Length of Stay Meetings, dates discussed:    Additional Comments: 11/12/2016 CM contacted by [place] - pt is active with their home hospice program -  [facility];  a home based palliative care program administered by [place]. [facility]  resource requesting palliative care consult for goals of care.  Attending has attempted to speak with wife multiple times to discuss goals of care as pts condition appears to be worsening - wife is at work and will meet with attending when she is available Maryclare Labrador, RN 11/12/2016, 11:05 AM

## 2016-11-13 ENCOUNTER — Inpatient Hospital Stay (HOSPITAL_COMMUNITY): Payer: Commercial Managed Care - HMO

## 2016-11-13 ENCOUNTER — Other Ambulatory Visit: Payer: Self-pay | Admitting: Cardiology

## 2016-11-13 DIAGNOSIS — Z7189 Other specified counseling: Secondary | ICD-10-CM

## 2016-11-13 DIAGNOSIS — Z0289 Encounter for other administrative examinations: Secondary | ICD-10-CM

## 2016-11-13 DIAGNOSIS — T17908S Unspecified foreign body in respiratory tract, part unspecified causing other injury, sequela: Secondary | ICD-10-CM

## 2016-11-13 DIAGNOSIS — Z515 Encounter for palliative care: Secondary | ICD-10-CM

## 2016-11-13 DIAGNOSIS — J441 Chronic obstructive pulmonary disease with (acute) exacerbation: Secondary | ICD-10-CM

## 2016-11-13 DIAGNOSIS — C3490 Malignant neoplasm of unspecified part of unspecified bronchus or lung: Secondary | ICD-10-CM

## 2016-11-13 LAB — GLUCOSE, CAPILLARY
GLUCOSE-CAPILLARY: 116 mg/dL — AB (ref 65–99)
GLUCOSE-CAPILLARY: 97 mg/dL (ref 65–99)
Glucose-Capillary: 106 mg/dL — ABNORMAL HIGH (ref 65–99)
Glucose-Capillary: 124 mg/dL — ABNORMAL HIGH (ref 65–99)
Glucose-Capillary: 134 mg/dL — ABNORMAL HIGH (ref 65–99)
Glucose-Capillary: 152 mg/dL — ABNORMAL HIGH (ref 65–99)

## 2016-11-13 LAB — BLOOD GAS, ARTERIAL
Acid-Base Excess: 9.9 mmol/L — ABNORMAL HIGH (ref 0.0–2.0)
BICARBONATE: 33.6 mmol/L — AB (ref 20.0–28.0)
Drawn by: 225631
FIO2: 40
LHR: 16 {breaths}/min
MECHVT: 0.516 mL
O2 Saturation: 98.5 %
PATIENT TEMPERATURE: 99.2
PCO2 ART: 42.3 mmHg (ref 32.0–48.0)
PEEP/CPAP: 5 cmH2O
PO2 ART: 115 mmHg — AB (ref 83.0–108.0)
pH, Arterial: 7.512 — ABNORMAL HIGH (ref 7.350–7.450)

## 2016-11-13 LAB — BASIC METABOLIC PANEL
ANION GAP: 10 (ref 5–15)
BUN: 22 mg/dL — ABNORMAL HIGH (ref 6–20)
CHLORIDE: 93 mmol/L — AB (ref 101–111)
CO2: 30 mmol/L (ref 22–32)
Calcium: 9 mg/dL (ref 8.9–10.3)
Creatinine, Ser: 0.75 mg/dL (ref 0.61–1.24)
GFR calc non Af Amer: >60 mL/min (ref >60)
Glucose, Bld: 97 mg/dL (ref 65–99)
POTASSIUM: 4.1 mmol/L (ref 3.5–5.1)
SODIUM: 133 mmol/L — AB (ref 135–145)

## 2016-11-13 LAB — MAGNESIUM: MAGNESIUM: 2 mg/dL (ref 1.7–2.4)

## 2016-11-13 LAB — CBC
HEMATOCRIT: 41.2 % (ref 39.0–52.0)
HEMOGLOBIN: 12.8 g/dL — AB (ref 13.0–17.0)
MCH: 28.4 pg (ref 26.0–34.0)
MCHC: 31.1 g/dL (ref 30.0–36.0)
MCV: 91.6 fL (ref 78.0–100.0)
Platelets: 118 10*3/uL — ABNORMAL LOW (ref 150–400)
RBC: 4.5 MIL/uL (ref 4.22–5.81)
RDW: 19.1 % — ABNORMAL HIGH (ref 11.5–15.5)
WBC: 15.2 10*3/uL — AB (ref 4.0–10.5)

## 2016-11-13 LAB — CULTURE, RESPIRATORY: CULTURE: NORMAL

## 2016-11-13 LAB — CULTURE, RESPIRATORY W GRAM STAIN

## 2016-11-13 LAB — TRIGLYCERIDES: Triglycerides: 339 mg/dL — ABNORMAL HIGH (ref <150)

## 2016-11-13 LAB — PHOSPHORUS: PHOSPHORUS: 4.4 mg/dL (ref 2.5–4.6)

## 2016-11-13 LAB — VANCOMYCIN, TROUGH: Vancomycin Tr: 12 ug/mL — ABNORMAL LOW (ref 15–20)

## 2016-11-13 MED ORDER — VANCOMYCIN HCL IN DEXTROSE 1-5 GM/200ML-% IV SOLN
1000.0000 mg | Freq: Three times a day (TID) | INTRAVENOUS | Status: AC
  Administered 2016-11-13 – 2016-11-18 (×13): 1000 mg via INTRAVENOUS
  Filled 2016-11-13 (×17): qty 200

## 2016-11-13 MED ORDER — SODIUM CHLORIDE 0.9 % IV SOLN
25.0000 ug/h | INTRAVENOUS | Status: DC
  Administered 2016-11-13: 25 ug/h via INTRAVENOUS
  Filled 2016-11-13 (×2): qty 50

## 2016-11-13 MED ORDER — DEXMEDETOMIDINE HCL IN NACL 400 MCG/100ML IV SOLN
0.4000 ug/kg/h | INTRAVENOUS | Status: DC
  Administered 2016-11-13: 0.5 ug/kg/h via INTRAVENOUS
  Administered 2016-11-13: 0.7 ug/kg/h via INTRAVENOUS
  Administered 2016-11-13: 0.3 ug/kg/h via INTRAVENOUS
  Filled 2016-11-13 (×3): qty 100

## 2016-11-13 MED ORDER — FUROSEMIDE 10 MG/ML IJ SOLN
20.0000 mg | Freq: Four times a day (QID) | INTRAMUSCULAR | Status: AC
  Administered 2016-11-13 (×3): 20 mg via INTRAVENOUS
  Filled 2016-11-13 (×3): qty 2

## 2016-11-13 MED ORDER — SENNOSIDES 8.8 MG/5ML PO SYRP
5.0000 mL | ORAL_SOLUTION | Freq: Two times a day (BID) | ORAL | Status: DC
  Administered 2016-11-13 – 2016-11-16 (×4): 5 mL
  Filled 2016-11-13 (×10): qty 5

## 2016-11-13 MED ORDER — MIDAZOLAM HCL 2 MG/2ML IJ SOLN
1.0000 mg | INTRAMUSCULAR | Status: DC | PRN
  Administered 2016-11-13: 2 mg via INTRAVENOUS
  Filled 2016-11-13: qty 2

## 2016-11-13 MED ORDER — FENTANYL BOLUS VIA INFUSION
25.0000 ug | INTRAVENOUS | Status: DC | PRN
  Administered 2016-11-13 – 2016-11-15 (×5): 25 ug via INTRAVENOUS
  Filled 2016-11-13: qty 25

## 2016-11-13 NOTE — Consult Note (Signed)
[facility] Nurse wound consult note Reason for Consult:perineal wound Wound type: stage II, IAD Pressure Ulcer POA: Yes Measurement: 0.5cm x 0.25cm x 0.2 gluteal cleft                         2cm x 2cm x 0.1cm and 3cm x 2cm x 0.1cm                          Left buttock Wound JKD:TOIZ Drainage (amount, consistency, odor) none Periwound: intact, perineum reddened Dressing procedure/placement/frequency: I have provided nurses with orders for criticaid  To perineum and allevyn sacral dressing for gluteal cleft and for preventative measure for sacrum. We will not follow, but will remain available to this patient, to nursing, and the medical and/or surgical teams.  Please re-consult if we need to assist further.    Fara Olden, RN-C, WTA-C Wound Treatment Associate

## 2016-11-13 NOTE — Consult Note (Signed)
Consultation Note Date: 11/13/2016   Patient Name: Ernest [name]  DOB: October 20, 1956  MRN: [id number]  Age / Sex: 60 y.o., male  PCP: Ernest Lima, MD Referring Physician: Rush Farmer, MD  Reason for Consultation: Disposition, Establishing goals of care and Psychosocial/spiritual support  HPI/Patient Profile: 60 y.o. 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    Clinical Assessment and Goals of Care: When I went to visit Ernest [name] his wife had just stepped out of his room and was crying in the hall. We sat down away from his bedside, per her request. After introducing myself and explaining my role on his care team, she began to explain things from her perspective. She relayed that she understood his lung cancer was not curable, but initially had so much hope that the chemotherapy would give them more time and enable a good quality of life. Since his first treatment she has seen his progressive decline, getting to the point where he cannot do the small household tasks that he enjoyed, and expressing that he was "tired" and "too tired for more." I asked her to clarify what she thought this meant, and she felt it was him saying he did not want any more treatment and no longer wanted to live under these debilitated circumstances. As his shortness of breath increased over the past week, he also expressed to her that he did not want to come to the hospital because he was afraid he would be put on life support and would never leave. It was only when he was almost unresponsive that she called EMS.  Today, she is very tearful because she had a frank discussion with Dr. Nelda [name] and learned about the seriousness of his condition, the progression of his cancer, and the real possibility he may not recover from  this hospitalization. She is shaken by this information, but felt "deep in her heart" he was declining. She is also accepting of his decision, whether he wants to try for a few more days to see if he can improve and get off the vent, or if he wants to come off the vent sooner, understanding that he may not breathe on his own and will die. Her main focus is making sure he doesn't suffer.  I then spoke with Ernest [name] at his bedside. He remains on full vent support and is unable to talk, but is fully oriented and was able to write his responses. I relayed the conversation I had with his wife. I also reiterated the information she had learned today: his cancer is growing and we are not sure if he will recover from this hospitalization. I posed the questions to him that his wife and I had discussed: did he want to shift to more of a comfort care approach and remove the vent (understanding that he may not breathe on his own and could die), or did he want to wait another few days to see if his acute issues improved/we had optimized to our best ability for vent removal. He wrote "I want to try to live," which I clarified meant waiting a few days to see how things go, which he nodded his head in agreement with. I explained the plan would then be to continue to treat what is treatable, continue to try to vent wean as able, and  reassess over the next few days.   Of note, I did not discuss trach/peg with him. After the above conversation he was extremely tearful. I told them a member of our team would continue to visit and facilitate further discussions as his condition declares itself. He and his wife were very appreciative and both agreed with that plan.   Primary Decision Maker PATIENT; his wife fully respects whatever decisions he makes.    SUMMARY OF RECOMMENDATIONS    DNR; continue to treat what is treatable and see how things progress over the next few days. Palliative will continue to follow and  support.  Continue fentanyl drip, will add PRN fentanyl bolus for breakthrough pain  Will also start senna BID via tube for bowel support  Code Status/Advance Care Planning:  DNR  Palliative Prophylaxis:   Aspiration, Bowel Regimen, Delirium Protocol, Frequent Pain Assessment, Oral Care and Turn Reposition  Additional Recommendations (Limitations, Scope, Preferences):  Full Scope Treatment  Psycho-social/Spiritual:   Desire for further Chaplaincy support:no  Additional Recommendations: TBD  Prognosis:   Unable to determine  Discharge Planning: To Be Determined      Primary Diagnoses: Present on Admission: . Acute respiratory failure with hypoxia and hypercarbia ([name])   I have reviewed the medical record, interviewed the patient and family, and examined the patient. The following aspects are pertinent.  Past Medical History:  Diagnosis Date  . Acute bronchitis 11/05/2016  . Anxiety   . CAD (coronary artery disease)    Left Main 30% stenosis, LAD 20 - 30 % stenosis, first and second diagonal branchesat 40 - 50%  stenosis with small arteries, circumflex had 30% stenosis in the large obtuse marginal, RCA at 70 - 80%  stenosis [not felt to be occlusive after evaluation with flow wire], distal 50 - 60% stenosis - [name][  . Chronic insomnia   . COPD with asthma ([place]) 09/08/2007  . DDD (degenerative disc disease)   . Depression   . Diabetes mellitus without complication ([place]) 3/35/4562   no medications now 07/24/2016  . GERD (gastroesophageal reflux disease)   . Gout   . History of cardiovascular stress test    Myoview 7/16:  Diaphragmatic attenuation, no ischemia, EF 56%; Low Risk  . History of radiation therapy 11/10/13- 12/29/13   left lung 6600 cGy in 33 sessions  . Hx of colonoscopy   . Hyperlipidemia   . Hypertension    dr Percival Spanish  . Itching due to drug 03/19/2016  . Lung cancer ([place]) 10/04/13   LUL squamous cell lung cancer  . Lung cancer ([place])    . Nausea with vomiting 03/26/2016  . OSA (obstructive sleep apnea)    NPSG 09/10/10- AHI 11.3/hr  . PVD (peripheral vascular disease) ([name])    PTA/Stent right common iliac   Social History   Social History  . Marital status: Married    Spouse name: N/A  . Number of children: N/A  . Years of education: N/A   Occupational History  . Disabled welder Unemployed   Social History Main Topics  . Smoking status: Former Smoker    Packs/day: 0.00    Years: 43.00    Types: Cigarettes    Quit date: 09/23/2013  . Smokeless tobacco: Never Used     Comment: history of 3 PPD, 11/02/13   . Alcohol use No  . Drug use: No  . Sexual activity: No   Other Topics Concern  . None   Social History Narrative   Lives with  wife in a one story home.  Has no children.  On disability.  Used to work as a Building control surveyor.  Education: 11th grade.    Family History  Problem Relation Age of Onset  . Heart attack Mother   . Heart attack Sister   . Lung cancer Sister   . Cancer Sister     small cell lung, mets to brain  . Hypertension Brother   . Emphysema Sister   . Stroke Neg Hx    Scheduled Meds: . chlorhexidine gluconate (MEDLINE KIT)  15 mL Mouth Rinse BID  . enoxaparin (LOVENOX) injection  40 mg Subcutaneous Q24H  . famotidine (PEPCID) IV  20 mg Intravenous Q12H  . feeding supplement (PRO-STAT SUGAR FREE 64)  30 mL Per Tube TID  . feeding supplement (VITAL AF 1.2 CAL)  1,000 mL Per Tube Q24H  . furosemide  20 mg Intravenous Q6H  . insulin aspart  0-20 Units Subcutaneous Q4H  . ipratropium-albuterol  3 mL Nebulization Q4H  . mouth rinse  15 mL Mouth Rinse QID  . multivitamin  15 mL Per Tube Daily  . piperacillin-tazobactam (ZOSYN)  IV  3.375 g Intravenous Q8H  . vancomycin  750 mg Intravenous Q8H   Continuous Infusions: . dexmedetomidine 0.7 mcg/kg/hr (11/13/16 1113)  . fentaNYL infusion INTRAVENOUS 25 mcg/hr (11/13/16 1215)   PRN Meds:.sodium chloride, albuterol, midazolam Allergies  Allergen  Reactions  . Carboplatin Shortness Of Breath, Swelling and Rash    Swelling of lips, rash on face,eyes and head   Review of Systems  Constitutional: Positive for activity change (increased SOB and fatigue PTA) and fatigue.  HENT: Positive for sinus pressure, sore throat and trouble swallowing.        On vent, c/o oral and throat pain from tube  Eyes: Negative for visual disturbance.  Respiratory: Positive for cough, shortness of breath and wheezing. Negative for chest tightness.   Cardiovascular: Negative for chest pain.  Gastrointestinal: Negative for abdominal distention, abdominal pain, constipation, diarrhea, nausea and vomiting.  Musculoskeletal: Positive for neck stiffness. Negative for back pain.  Skin: Positive for pallor.  Neurological: Positive for weakness. Negative for dizziness, light-headedness and headaches.  Psychiatric/Behavioral: Positive for sleep disturbance. Negative for confusion, hallucinations, self-injury and suicidal ideas. The patient is nervous/anxious.    Physical Exam  Constitutional: He is oriented to person, place, and time. He appears well-developed and well-nourished. No distress. He is intubated.  Pt very tearful  HENT:  Head: Normocephalic and atraumatic.  Vent  Cardiovascular: Normal rate and regular rhythm.   Pulmonary/Chest: He is intubated.  Intubated on vent  Abdominal: Soft.  Musculoskeletal: Normal range of motion.  Neurological: He is alert and oriented to person, place, and time.  Skin: Skin is warm and dry. There is pallor.  Psychiatric: He has a normal mood and affect. Judgment and thought content normal. He is withdrawn. Cognition and memory are normal.  Pt is very tearful and withdrawn. Unable to speak d/t being intubated, he does use a writing board to communicate.   Vital Signs: BP 115/68   Pulse 93   Temp 98.8 F (37.1 C) (Oral)   Resp (!) 26   Ht 5' 6"  (1.676 m)   Wt 75.4 kg (166 lb 3.6 oz)   SpO2 100%   BMI 26.83 kg/m   Pain Assessment: CPOT     SpO2: SpO2: 100 % O2 Device:SpO2: 100 % O2 Flow Rate: .   IO: Intake/output summary:  Intake/Output Summary (Last 24 hours) at 11/13/16  [place] filed at 11/13/16 1100  Gross per 24 hour  Intake           2605.6 ml  Output             3735 ml  Net          -1129.4 ml    LBM: Last BM Date: 11/10/16 Baseline Weight: Weight: 77.5 kg (170 lb 13.7 oz) Most recent weight: Weight: 75.4 kg (166 lb 3.6 oz)     Palliative Assessment/Data: PPS 60% prior to admission    Time In: 1250 Time Out: 1350 Time Total: 60 minutes Greater than 50%  of this time was spent counseling and coordinating care related to the above assessment and plan.  Signed by: Charlynn Court, NP Palliative Medicine Team Pager # [phone] (M-F 7a-5p) Team Phone # [phone] (Nights/Weekends)

## 2016-11-13 NOTE — Progress Notes (Signed)
Patient ETT holder changed, with assistance of RN, without complications.

## 2016-11-13 NOTE — Progress Notes (Signed)
PULMONARY / CRITICAL CARE MEDICINE   Name: Ernest [name] MRN: [id number] DOB: 27-Sep-1956    ADMISSION DATE:  11/11/2016 CONSULTATION DATE:   REFERRING MD:  EDP - WARD  CHIEF COMPLAINT:  Shortness of breath  HISTORY OF PRESENT ILLNESS:   Ernest [name] is a 79M with PMH significant for COPD, stage IV NSCLC on active chemo with docetaxel and Cyramza, DMII, CAD, HTN, H LD, OSA and PVD, who presents with acute onset shortness of breath that awakened him from sleep and was quite severe. He has been more short of breath than usual for a few days. This was accompanied by congestion. No fever / chills / diarrhea / nausea / vomiting. His wife reports he felt like he had phlegm, but couldn't get it up. No hemoptysis. He was treating his congestion with OTC mucinex and had increased his rescue albuterol use. He went to bed doing okay (he always has to sleep sitting up), but then awoke around 3am and needed to go to the hospital. EMS was called and found him minimally responsive. His respirations were assisted with BVM en route. Once in the ED, he was intubated.   Labs showed elevated WBC (which seems chronic for him and may be chemo related), normal renal function, normal BNP, normal troponin, normal lactate. ABG was consistent with a respiratory acidosis. CXR with perhaps some interval enlargement in chronic right sided findings as well as interstitial prominence concerning for possible infection/postobstructive pneumonia vs acute edema.  Since being intubated, his mental status has improved. He is arousable and briskly follows commands. He denies pain.   SUBJECTIVE:  Failed weaning this AM with increased RR and MV.  VITAL SIGNS: BP 132/80   Pulse 92   Temp 98.9 F (37.2 C) (Oral)   Resp (!) 23   Ht '5\' 6"'$  (1.676 m)   Wt 75.4 kg (166 lb 3.6 oz)   SpO2 98%   BMI 26.83 kg/m   HEMODYNAMICS:    VENTILATOR SETTINGS: Vent Mode: PRVC FiO2 (%):  [40 %] 40 % Set Rate:  [16 bmp] 16 bmp Vt  Set:  [510 mL] 510 mL PEEP:  [5 cmH20] 5 cmH20 Plateau Pressure:  [15 cmH20-17 cmH20] 15 cmH20  INTAKE / OUTPUT: I/O last 3 completed shifts: In: 3784.2 [I.V.:1221.7; NG/GT:1450; IV Piggyback:1112.5] Out: 0938 [HWEXH:3716]  PHYSICAL EXAMINATION:  General Well nourished, well developed, intubated, sedate but arousable this AM  HEENT No gross abnormalities. OETT, OGT in place  Pulmonary Coarse inspiratory phase with prolonged expiratory wheeze throughout. Vent-assisted effort, symmetrical expansion, frequent coughing.  Decreased BS on the right.  Cardiovascular Normal rate, regular rhythm. S1, s2. No m/r/g. Distal pulses palpable.  Abdomen Soft, NT, ND and +BS  Musculoskeletal Grossly normal   Lymphatics No cervical, supraclavicular or axillary adenopathy.   Neurologic Grossly intact. No focal deficits.   Skin/Integuement No cyanosis, no clubbing, no edema. Dry skin with scattered nummular lesions   LABS:  BMET  Recent Labs Lab 11/11/16 0340 11/11/16 0352 11/12/16 0231 11/13/16 0328  NA 134* 136 134* 133*  K 4.6 4.5 4.5 4.1  CL 101 100* 97* 93*  CO2 21*  --  27 30  BUN 10 12 21* 22*  CREATININE 0.84 0.80 0.83 0.75  GLUCOSE 190* 191* 148* 97   Electrolytes  Recent Labs Lab 11/11/16 0340 11/12/16 0231 11/13/16 0328  CALCIUM 8.6* 8.8* 9.0  MG  --  2.2 2.0  PHOS  --  2.4* 4.4   CBC  Recent Labs Lab  11/11/16 0340 11/11/16 0352 11/12/16 0231 11/13/16 0328  WBC 29.5*  --  16.3* 15.2*  HGB 13.2 14.3 12.2* 12.8*  HCT 42.1 42.0 39.0 41.2  PLT 215  --  141* 118*   Coag's No results for input(s): APTT, INR in the last 168 hours.  Sepsis Markers  Recent Labs Lab 11/11/16 0504 11/11/16 1349 11/11/16 1926  LATICACIDVEN 1.40 3.4* 2.8*   ABG  Recent Labs Lab 11/11/16 0356 11/12/16 0342 11/13/16 0347  PHART 7.194* 7.403 7.512*  PCO2ART 74.6* 47.1 42.3  PO2ART 70.0* 112* 115*   Liver Enzymes No results for input(s): AST, ALT, ALKPHOS, BILITOT, ALBUMIN  in the last 168 hours. Cardiac Enzymes No results for input(s): TROPONINI, PROBNP in the last 168 hours.  Glucose  Recent Labs Lab 11/12/16 1201 11/12/16 1550 11/12/16 1928 11/12/16 2353 11/13/16 0356 11/13/16 0828  GLUCAP 128* 116* 145* 122* 106* 124*   Imaging Dg Chest Port 1 View  Result Date: 11/13/2016 CLINICAL DATA:  Endotracheal tube. EXAM: PORTABLE CHEST 1 VIEW COMPARISON:  11/12/2016 FINDINGS: Endotracheal tube projects between the clavicular heads and carina, unchanged. Enteric tube courses into the left upper abdomen with tip not imaged and side hole just beyond the GE junction. Right peritracheal and right lower lobe masses are similar to the prior study. Patchy airspace and interstitial opacities in the right lung predominantly involve the upper lobe and also have not significantly changed. Chronic apical left upper lobe consolidation and pleural thickening/loculated fluid are unchanged. No pneumothorax is identified. IMPRESSION: 1. Unchanged right lung opacities concerning for pneumonia. 2. Unchanged right paratracheal and right lower lobe masses. Electronically Signed   By: Ernest [name] M.D.   On: 11/13/2016 08:52   STUDIES:  None  CULTURES: Blood cx x 2  12/18 >>NTD UA 12/18>>>NTD Resp quant 12/18>>>NTD RVP>>>NTD  ANTIBIOTICS: Vancomycin 12/18 >> Zosyn 12/18 >>  SIGNIFICANT EVENTS:   LINES/TUBES: OETT 12/18 OGT 12/18 PIV  DISCUSSION: Ernest [name] is a 45M with multiple comorbidities admitted with what appears to be an acute exacerbation of asthma/COPD. He has wheezing, increased cough and a change in sputum from his baseline. His wife does not report any other infectious symptoms, but it seems reasonable to err on the side of caution given his ongoing chemotherapy and elevated WBC. We will continue antibiotics for now, check a viral panel, continue DuoNebs q 4 with albuterol q2h prn, continue steroids and hope that he will be able to wean from the  ventilator.   ASSESSMENT / PLAN:  PULMONARY A: Acute hypoxemic/hypercapneic respiratory failure COPD/asthma exacerbation Stage IV NSCLC P:   Continue full ventilatory support Hold off weaning for now. DuoNebs q 4h scheduled Albuterol q2h prn Changed prednisone to IV solumedrol, continue for now Continue vanc/zosyn No trach/peg per wife, DNR status.  CARDIOVASCULAR A:  No acute issues Hx CAD P:  Tele monitor Diureses  RENAL A:   No acute issues P:   Trend BMP Monitor [facility] to adjust dosing as necessary KVO IVF Gentle diureses today again, lasix 20 mg IV q6 x3 doses.  GASTROINTESTINAL A:   No acute issues P:   Pepcid for gi ppx TF per nutrition  HEMATOLOGIC A:   Leukocytosis P:  Trend CBC Continue Abx: vanc/zosyn.  INFECTIOUS A:   Concern for sepsis 2/2 pneumonia P:   Continue vanc/zosyn til cultures RVP negative, d/c isolation Follow blood cultures Procalcitonin protocol <0.10 but infiltrate noted on CT  ENDOCRINE A:   DMII   P:   CBG /  SSI  NEUROLOGIC A:   No acute issues P:   RASS goal: 0 Fentanyl drip D/C propofol Precedex ordered PRN versed  FAMILY  - Updates: Spoke with wife today, reviewed chest CTs from this admission and prior, demonstrated advancing cancer.  After discussion, she will speak with brother but does not believe he would want trach/peg, if things are to worsen then will switch to palliation.  Will make a full DNR today and call palliative care to evaluate.  - Inter-disciplinary family meet or Palliative Care meeting due by:  day 7  The patient is critically ill with multiple organ systems failure and requires high complexity decision making for assessment and support, frequent evaluation and titration of therapies, application of advanced monitoring technologies and extensive interpretation of multiple databases.   Critical Care Time devoted to patient care services described in this note is  45  Minutes.  This time reflects time of care of this signee Dr Jennet Maduro. This critical care time does not reflect procedure time, or teaching time or supervisory time of PA/NP/Med student/Med Resident etc but could involve care discussion time.  Rush Farmer, M.D. [facility] Pulmonary/Critical Care Medicine. Pager: [phone]. After hours pager: [phone].  11/13/2016, 10:31 AM

## 2016-11-13 NOTE — Progress Notes (Signed)
Pharmacy Antibiotic Note  Ernest [name] is a 60 y.o. male with lung cancer, on chemotherapy, admitted on 11/11/2016 with acute respiratory failure and leukocytosis and started empiric vanc/zosyn. Wbc trending down 29.5 >> 15.2, afebrile  Scr stable 0.75, est. crcl ~ 85-90 ml/min.  Vancomycin trough = 12, subtherapeutic draw ~ 1hr late.   Plan: Increase Vancomycin to 1000 mg IV Q 8 hrs Zosyn 3.375g IV Q 8hrs Monitor renal function and f/u cultures  Height: '5\' 6"'$  (167.6 cm) Weight: 166 lb 3.6 oz (75.4 kg) IBW/kg (Calculated) : 63.8  Temp (24hrs), Avg:99.1 F (37.3 C), Min:98.8 F (37.1 C), Max:99.3 F (37.4 C)   Recent Labs Lab 11/11/16 0340 11/11/16 0352 11/11/16 0504 11/11/16 1349 11/11/16 1926 11/12/16 0231 11/13/16 0328 11/13/16 1356  WBC 29.5*  --   --   --   --  16.3* 15.2*  --   CREATININE 0.84 0.80  --   --   --  0.83 0.75  --   LATICACIDVEN  --   --  1.40 3.4* 2.8*  --   --   --   VANCOTROUGH  --   --   --   --   --   --   --  12*    Estimated Creatinine Clearance: 87.5 mL/min (by C-G formula based on SCr of 0.75 mg/dL).    Allergies  Allergen Reactions  . Carboplatin Shortness Of Breath, Swelling and Rash    Swelling of lips, rash on face,eyes and head    Antimicrobials this admission: Vancomycin 12/18  >>  Zosyn 12/18 >>   Dose adjustments this admission: 12/20 VT - 12 on 750 mg Q 8, increase to 1g Q 8  Microbiology results: 12/18 BCx:  12/18 TA: normal flora  12/18 MRSA PCR: neg  Thank you for allowing pharmacy to be a part of this patient's care.  Maryanna Shape, PharmD, BCPS  Clinical Pharmacist  Pager: [phone]   11/13/2016 4:08 PM

## 2016-11-14 ENCOUNTER — Inpatient Hospital Stay (HOSPITAL_COMMUNITY): Payer: Commercial Managed Care - HMO

## 2016-11-14 DIAGNOSIS — R0602 Shortness of breath: Secondary | ICD-10-CM

## 2016-11-14 DIAGNOSIS — Z9289 Personal history of other medical treatment: Secondary | ICD-10-CM

## 2016-11-14 DIAGNOSIS — R110 Nausea: Secondary | ICD-10-CM

## 2016-11-14 DIAGNOSIS — G893 Neoplasm related pain (acute) (chronic): Secondary | ICD-10-CM

## 2016-11-14 LAB — BASIC METABOLIC PANEL
Anion gap: 8 (ref 5–15)
BUN: 31 mg/dL — AB (ref 6–20)
CALCIUM: 9.2 mg/dL (ref 8.9–10.3)
CHLORIDE: 94 mmol/L — AB (ref 101–111)
CO2: 33 mmol/L — AB (ref 22–32)
CREATININE: 0.81 mg/dL (ref 0.61–1.24)
GFR calc non Af Amer: >60 mL/min (ref >60)
Glucose, Bld: 133 mg/dL — ABNORMAL HIGH (ref 65–99)
Potassium: 3.4 mmol/L — ABNORMAL LOW (ref 3.5–5.1)
SODIUM: 135 mmol/L (ref 135–145)

## 2016-11-14 LAB — GLUCOSE, CAPILLARY
GLUCOSE-CAPILLARY: 81 mg/dL (ref 65–99)
Glucose-Capillary: 107 mg/dL — ABNORMAL HIGH (ref 65–99)
Glucose-Capillary: 140 mg/dL — ABNORMAL HIGH (ref 65–99)
Glucose-Capillary: 117 mg/dL — ABNORMAL HIGH (ref 65–99)
Glucose-Capillary: 125 mg/dL — ABNORMAL HIGH (ref 65–99)
Glucose-Capillary: 94 mg/dL (ref 65–99)

## 2016-11-14 LAB — BLOOD GAS, ARTERIAL
Acid-Base Excess: 9.9 mmol/L — ABNORMAL HIGH (ref 0.0–2.0)
BICARBONATE: 34.3 mmol/L — AB (ref 20.0–28.0)
Drawn by: 42624
FIO2: 40
O2 Saturation: 95.8 %
PATIENT TEMPERATURE: 98.6
PCO2 ART: 49.9 mmHg — AB (ref 32.0–48.0)
PEEP: 5 cmH2O
PO2 ART: 82.4 mmHg — AB (ref 83.0–108.0)
RATE: 16 resp/min
VT: 510 mL
pH, Arterial: 7.451 — ABNORMAL HIGH (ref 7.350–7.450)

## 2016-11-14 LAB — CBC
HCT: 39.3 % (ref 39.0–52.0)
Hemoglobin: 12.5 g/dL — ABNORMAL LOW (ref 13.0–17.0)
MCH: 28.5 pg (ref 26.0–34.0)
MCHC: 31.8 g/dL (ref 30.0–36.0)
MCV: 89.5 fL (ref 78.0–100.0)
Platelets: 88 10*3/uL — ABNORMAL LOW (ref 150–400)
RBC: 4.39 MIL/uL (ref 4.22–5.81)
RDW: 18.2 % — AB (ref 11.5–15.5)
WBC: 11.4 10*3/uL — ABNORMAL HIGH (ref 4.0–10.5)

## 2016-11-14 LAB — PHOSPHORUS: PHOSPHORUS: 4.2 mg/dL (ref 2.5–4.6)

## 2016-11-14 LAB — MAGNESIUM: Magnesium: 2.2 mg/dL (ref 1.7–2.4)

## 2016-11-14 MED ORDER — CLONAZEPAM 1 MG PO TABS: 1.0000 mg | ORAL_TABLET | Freq: Every day | ORAL | Status: DC

## 2016-11-14 MED ORDER — POTASSIUM CHLORIDE 20 MEQ/15ML (10%) PO SOLN
40.0000 meq | Freq: Three times a day (TID) | ORAL | Status: AC
  Administered 2016-11-14: 40 meq
  Filled 2016-11-14: qty 30

## 2016-11-14 MED ORDER — LORAZEPAM 2 MG/ML IJ SOLN
INTRAMUSCULAR | Status: AC
  Filled 2016-11-14: qty 1

## 2016-11-14 MED ORDER — LORAZEPAM 2 MG/ML IJ SOLN
0.5000 mg | INTRAMUSCULAR | Status: DC | PRN
  Administered 2016-11-14 – 2016-11-15 (×3): 0.5 mg via INTRAVENOUS
  Filled 2016-11-14 (×2): qty 1

## 2016-11-14 MED ORDER — CLONAZEPAM 0.5 MG PO TBDP
1.0000 mg | ORAL_TABLET | Freq: Every day | ORAL | Status: DC
  Administered 2016-11-14: 1 mg via ORAL
  Filled 2016-11-14: qty 2

## 2016-11-14 NOTE — Progress Notes (Signed)
I was called back to the floor by nursing as patient has been stating that he and his wife have been discussing and he wants to make changes to his code status.  Met with patient alone and then in conjunction with his wife.  He reports that making it to Christmas is something that is important to him and he wants to be reintubated if necessary to make it to Christmas.  We discussed that there is a high possibility that he may not be able to be extubated again, and we discussed that he would want to have one way extubation with focus on comfort sometime after Christmas if reintubated and it does not appear that he will be able to come of vent successfully.    He also desires pressors/ACLS medications if needed through Christmas as well.  He does not want chest compressions (CPR) in the event of cardiac arrest.  He does not want defibrillation.  He would never want trach or PEG.  Code status updated to partial code to reflect above.  Primary service is aware that he desired to be reintubated if necessary.  Time in: 1400 Time out 1435 Additional time: 35 minutes  Micheline Rough, MD [place] Team [phone]

## 2016-11-14 NOTE — Progress Notes (Signed)
SLP Cancellation Note  Patient Details Name: Ernest [name] MRN: [id number] DOB: 31-Jul-1956   Cancelled treatment:       Reason Eval/Treat Not Completed: Other (comment). Pt had some hard coughing and vomiting after trying ice chips with RN. He would like to wait until tomorrow for evaluation and further PO trials. Pt may have moist swabs until tomorrow.    Talayeh Bruinsma, Katherene Ponto 11/14/2016, 2:16 PM

## 2016-11-14 NOTE — Progress Notes (Signed)
Daily Progress Note   Patient Name: Ernest [name]       Date: 11/14/2016 DOB: [birth year]  Age: 60 y.o. MRN#: [id number] Attending Physician: Rush Farmer, MD Primary Care Physician: [name], MD Admit Date: 11/11/2016  Reason for Consultation/Follow-up: Establishing goals of care and Psychosocial/spiritual support  Subjective: I met with Mr. Faiola this morning in conjunction with Dr. Nelda Marseille from critical care.  He reports his only complaint is the back his throat is sore from having ET tube in place.  We discussed plan with patient plan to extubate today.  He is clear when asked that he does not want to be reintubted in the event of decompensation.  At his request, I called and discussed with his wife as well.  She understands and is hopeful that he will do well, but she requested waiting until she is present to extubate.  She will be at the hospital within the hour.  Length of Stay: 3  Current Medications: Scheduled Meds:  . chlorhexidine gluconate (MEDLINE KIT)  15 mL Mouth Rinse BID  . enoxaparin (LOVENOX) injection  40 mg Subcutaneous Q24H  . famotidine (PEPCID) IV  20 mg Intravenous Q12H  . insulin aspart  0-20 Units Subcutaneous Q4H  . ipratropium-albuterol  3 mL Nebulization Q4H  . mouth rinse  15 mL Mouth Rinse QID  . piperacillin-tazobactam (ZOSYN)  IV  3.375 g Intravenous Q8H  . potassium chloride  40 mEq Per Tube TID  . sennosides  5 mL Per Tube BID  . vancomycin  1,000 mg Intravenous Q8H    Continuous Infusions: . dexmedetomidine Stopped (11/14/16 0913)  . fentaNYL infusion INTRAVENOUS Stopped (11/14/16 0913)    PRN Meds: sodium chloride, albuterol, fentaNYL, midazolam  Physical Exam        General: Alert, awake, in no acute distress.  Intubated. HEENT: No bruits, no goiter, no JVD Heart: Regular rate and rhythm. No murmur appreciated. Lungs: Good air movement (on vent currently) Abdomen: Soft, nontender, nondistended, positive bowel sounds.  Ext: No significant edema Skin: Warm and dry    Vital Signs: BP 107/62 (BP Location: Right Arm)   Pulse 82   Temp 98.4 F (36.9 C) (Oral)   Resp (!) 21   Ht 5' 6"  (1.676 m)   Wt 75 kg (165 lb 5.5 oz)  SpO2 96%   BMI 26.69 kg/m  SpO2: SpO2: 96 % O2 Device: O2 Device: Nasal Cannula O2 Flow Rate: O2 Flow Rate (L/min): 2 L/min  Intake/output summary:  Intake/Output Summary (Last 24 hours) at 11/14/16 1251 Last data filed at 11/14/16 1200  Gross per 24 hour  Intake          1877.78 ml  Output             2270 ml  Net          -392.22 ml   LBM: Last BM Date: 11/10/16 Baseline Weight: Weight: 77.5 kg (170 lb 13.7 oz) Most recent weight: Weight: 75 kg (165 lb 5.5 oz)       Palliative Assessment/Data:      Patient Active Problem List   Diagnosis Date Noted  . Goals of care, counseling/discussion   . Acute respiratory failure with hypoxia and hypercapnia ([place]) 11/11/2016  . Acute bronchitis 11/05/2016  . Tinea corporis 10/31/2016  . Hyponatremia 10/22/2016  . Anxiety 10/22/2016  . Cancer associated pain 08/27/2016  . Acute seasonal allergic rhinitis due to pollen 08/27/2016  . Insomnia due to anxiety and fear 08/01/2016  . Therapeutic opioid-induced constipation (OIC) 07/18/2016  . Encounter for antineoplastic chemotherapy 02/20/2016  . 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 COPD exacerbation ([place]) 11/24/2015  . Rhinitis, nonallergic, chronic 09/22/2015  . 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 Claudication ([name]) 08/10/2015  . Coronary artery disease involving native coronary artery of native heart without angina pectoris 05/17/2015  . Diabetes mellitus without complication ([place]) 34/19/3790  . Chronic respiratory failure with hypercapnia ([place])   .  Chronic pain syndrome   . Chronic respiratory failure with hypoxia ([place]) 12/15/2014  . Non-small cell carcinoma of lung, right ([place]) 10/07/2013  . Obstructive sleep apnea 09/24/2010  . Peripheral vascular disease ([place]) 05/15/2010  . Hyperlipidemia with target LDL less than 70 05/16/2009  . GOUT 09/08/2007  . Depression with anxiety 09/08/2007  . Essential hypertension 09/08/2007  . COPD mixed type ([place]) 09/08/2007  . GERD 09/08/2007    Palliative Care Assessment & Plan   Patient Profile: 60 y.o. male  with past medical history of COPD, stage IV NSCLC on active chemo with docetaxel and Cyramza, DMII, CAD, HTN, H LD, OSA and PVD admitted on 11/11/2016 with progressive shortness of breath.    Recommendations/Plan:  - Plan for one way extubation today.  His wife requested we wait until she is at the hospital.  She is on her way and will be here within the hour.  Goals of Care and Additional Recommendations:  Limitations on Scope of Treatment: No reintubation  Code Status:    Code Status Orders        Start     Ordered   11/13/16 1052  Do not attempt resuscitation (DNR)  Continuous    Question Answer Comment  Maintain current active treatments Yes   Do not initiate new interventions Yes      11/13/16 1056    Code Status History    Date Active Date Inactive Code Status Order ID Comments User Context   11/11/2016  5:13 AM 11/13/2016 10:56 AM Full Code [id number]  Dannielle Burn, MD ED   05/18/2016  2:00 PM 05/23/2016  4:22 PM Full Code [id number]  Waldemar Dickens, MD ED   04/21/2016  6:29 PM 04/25/2016  8:37 PM Full Code [id number]  Lily Kocher, MD Inpatient   01/18/2016  4:43 AM 01/21/2016  5:44 PM Full  Code [id number]  Etta Quill, DO ED   11/24/2015  4:44 AM 11/30/2015  4:42 PM Full Code [id number]  Etta Quill, DO ED   08/10/2015  4:42 PM 08/11/2015  5:54 PM Full Code [id number]  Lorretta Harp, MD Inpatient   04/11/2015  3:17 AM 04/15/2015  2:32 PM Full Code [id number]   Germain Osgood, PA-C ED   02/15/2014  4:02 AM 02/17/2014 10:40 PM Full Code [id number]  Berle Mull, MD ED    Advance Directive Documentation   Flowsheet Row Most Recent Value  Type of Advance Directive  Healthcare Power of Attorney  Pre-existing out of facility DNR order (yellow form or pink MOST form)  No data  "MOST" Form in Place?  No data       Prognosis:   Unable to determine  Discharge Planning:  To Be Determined  Care plan was discussed with patient, wife, Dr Nelda Marseille, bedside RN  Thank you for allowing the Palliative Medicine Team to assist in the care of this patient.   Time In: 0900 Time Out: 0930 Total Time 30 Prolonged Time Billed No      Greater than 50%  of this time was spent counseling and coordinating care related to the above assessment and plan.  Micheline Rough, MD  Please contact Palliative Medicine Team phone at [phone] for questions and concerns.

## 2016-11-14 NOTE — Telephone Encounter (Signed)
[email or web address] x1 

## 2016-11-14 NOTE — Telephone Encounter (Signed)
Per CMM, PA for Garlon Hatchet was denied  Outcome  Denied on December 16  Questionnaire submitted. PA Case [id number] Status: Pending review. PA Case: [id number], Status: Partial Denial, Coverage Starts on: 11/09/2016 12:00:00 AM, Coverage Ends on: 11/09/2018 12:00:00 AM. Questions? Contact [phone].  Please advise CY if you want to try and prescribe something else. Thanks.

## 2016-11-14 NOTE — Telephone Encounter (Signed)
He will need to replace Brovana neb solution with Duoneb (ipratropium-albuterol), # 120 ampules for 1 every 6 hours if needed, refill x 12, since insurance will not cover [employer]

## 2016-11-14 NOTE — Progress Notes (Signed)
[facility] Progress Note Patient Name: Ernest [name] DOB: 1956/11/21 MRN: [id number]   Date of Service  11/14/2016  HPI/Events of Note  Insomnia  eICU Interventions  Home clonazepam     Intervention Category Major Interventions: Other:  YACOUB,WESAM 11/14/2016, 8:34 PM

## 2016-11-14 NOTE — Progress Notes (Signed)
PULMONARY / CRITICAL CARE MEDICINE   Name: Ernest [name] MRN: [id number] DOB: 1956/02/14    ADMISSION DATE:  11/11/2016 CONSULTATION DATE:   REFERRING MD:  EDP - WARD  CHIEF COMPLAINT:  Shortness of breath  HISTORY OF PRESENT ILLNESS:   [name] is a 65M with PMH significant for COPD, stage IV NSCLC on active chemo with docetaxel and Cyramza, DMII, CAD, HTN, H LD, OSA and PVD, who presents with acute onset shortness of breath that awakened him from sleep and was quite severe. He has been more short of breath than usual for a few days. This was accompanied by congestion. No fever / chills / diarrhea / nausea / vomiting. His wife reports he felt like he had phlegm, but couldn't get it up. No hemoptysis. He was treating his congestion with OTC mucinex and had increased his rescue albuterol use. He went to bed doing okay (he always has to sleep sitting up), but then awoke around 3am and needed to go to the hospital. EMS was called and found him minimally responsive. His respirations were assisted with BVM en route. Once in the ED, he was intubated.   Labs showed elevated WBC (which seems chronic for him and may be chemo related), normal renal function, normal BNP, normal troponin, normal lactate. ABG was consistent with a respiratory acidosis. CXR with perhaps some interval enlargement in chronic right sided findings as well as interstitial prominence concerning for possible infection/postobstructive pneumonia vs acute edema.  Since being intubated, his mental status has improved. He is arousable and briskly follows commands. He denies pain.   SUBJECTIVE:  Weaning great this AM, ready for extubation.  VITAL SIGNS: BP (!) 93/47   Pulse 71   Temp 98.5 F (36.9 C) (Oral)   Resp 17   Ht '5\' 6"'$  (1.676 m)   Wt 75 kg (165 lb 5.5 oz)   SpO2 97%   BMI 26.69 kg/m   HEMODYNAMICS:    VENTILATOR SETTINGS: Vent Mode: PRVC FiO2 (%):  [40 %] 40 % Set Rate:  [16 bmp] 16 bmp Vt Set:   [510 mL] 510 mL PEEP:  [5 cmH20] 5 cmH20 Plateau Pressure:  [12 cmH20-18 cmH20] 18 cmH20  INTAKE / OUTPUT: I/O last 3 completed shifts: In: 3602.3 [I.V.:1207.3; CZ/YS:0630; IV Piggyback:750] Out: 3875 [Urine:3875]  PHYSICAL EXAMINATION:  General Well nourished, well developed, intubated, Alert and interactive.  HEENT No gross abnormalities. OETT, OGT in place  Pulmonary Decreased BS at the bases  Cardiovascular Normal rate, regular rhythm. S1, s2. No m/r/g. Distal pulses palpable.  Abdomen Soft, NT, ND and +BS  Musculoskeletal Grossly normal   Lymphatics No cervical, supraclavicular or axillary adenopathy.   Neurologic Grossly intact. No focal deficits.   Skin/Integuement No cyanosis, no clubbing, no edema. Dry skin with scattered nummular lesions   LABS:  BMET  Recent Labs Lab 11/12/16 0231 11/13/16 0328 11/14/16 0631  NA 134* 133* 135  K 4.5 4.1 3.4*  CL 97* 93* 94*  CO2 27 30 33*  BUN 21* 22* 31*  CREATININE 0.83 0.75 0.81  GLUCOSE 148* 97 133*   Electrolytes  Recent Labs Lab 11/12/16 0231 11/13/16 0328 11/14/16 0631  CALCIUM 8.8* 9.0 9.2  MG 2.2 2.0 2.2  PHOS 2.4* 4.4 4.2   CBC  Recent Labs Lab 11/12/16 0231 11/13/16 0328 11/14/16 0631  WBC 16.3* 15.2* 11.4*  HGB 12.2* 12.8* 12.5*  HCT 39.0 41.2 39.3  PLT 141* 118* 88*   Coag's No results for input(s): APTT,  INR in the last 168 hours.  Sepsis Markers  Recent Labs Lab 11/11/16 0504 11/11/16 1349 11/11/16 1926  LATICACIDVEN 1.40 3.4* 2.8*   ABG  Recent Labs Lab 11/12/16 0342 11/13/16 0347 11/14/16 0545  PHART 7.403 7.512* 7.451*  PCO2ART 47.1 42.3 49.9*  PO2ART 112* 115* 82.4*   Liver Enzymes No results for input(s): AST, ALT, ALKPHOS, BILITOT, ALBUMIN in the last 168 hours. Cardiac Enzymes No results for input(s): TROPONINI, PROBNP in the last 168 hours.  Glucose  Recent Labs Lab 11/13/16 0828 11/13/16 1146 11/13/16 1625 11/13/16 1926 11/13/16 2350 11/14/16 0350   GLUCAP 124* 116* 152* 97 134* 107*   Imaging Dg Chest Port 1 View  Result Date: 11/14/2016 CLINICAL DATA:  Intubation. EXAM: PORTABLE CHEST 1 VIEW COMPARISON:  11/13/2016 .  09/12/2016. FINDINGS: Endotracheal tube and NG tube in stable position. Persistent severe mediastinal adenopathy and large right lung mass without interim change again noted. No prominent pleural effusion. No pneumothorax. Left apical pleural thickening unchanged. Prior cervicothoracic spine fusion . IMPRESSION: 1. Lines and tubes in stable position. 2. Persistent severe mediastinal adenopathy and right lung mass without interim change. Electronically Signed   By: Marcello Moores  Register   On: 11/14/2016 07:34   STUDIES:  None  CULTURES: Blood cx x 2  12/18 >>NTD UA 12/18>>>NTD Resp quant 12/18>>>NTD RVP>>>NTD  ANTIBIOTICS: Vancomycin 12/18 >> Zosyn 12/18 >>  SIGNIFICANT EVENTS:   LINES/TUBES: OETT 12/18>>>12/21 OGT 12/18>>>12/21 PIV  DISCUSSION: [name] is a 69M with multiple comorbidities admitted with what appears to be an acute exacerbation of asthma/COPD. He has wheezing, increased cough and a change in sputum from his baseline. His wife does not report any other infectious symptoms, but it seems reasonable to err on the side of caution given his ongoing chemotherapy and elevated WBC. We will continue antibiotics for now, check a viral panel, continue DuoNebs q 4 with albuterol q2h prn, continue steroids and hope that he will be able to wean from the ventilator.   ASSESSMENT / PLAN:  PULMONARY A: Acute hypoxemic/hypercapneic respiratory failure COPD/asthma exacerbation Stage IV NSCLC P:   Extubate IS Ambulate DuoNebs q 4h scheduled Albuterol q2h prn Changed prednisone to IV solumedrol, continue for now Continue vanc/zosyn No trach/peg per wife, DNR status.  One way extubation.  CARDIOVASCULAR A:  No acute issues Hx CAD P:  Tele monitor Diureses  RENAL A:   No acute issues P:    Trend BMP Monitor [facility] to adjust dosing as necessary KVO IVF Continue diureses again today, 20 mg IV q6 x3 doses Replace electrolytes as indicated  GASTROINTESTINAL A:   No acute issues P:   Pepcid for gi ppx D/C TF post extubation Speech evaluation  HEMATOLOGIC A:   Leukocytosis P:  Trend CBC Continue Abx: vanc/zosyn.  INFECTIOUS A:   Concern for sepsis 2/2 pneumonia P:   Continue vanc/zosyn til cultures RVP negative, d/c isolation Follow blood cultures Procalcitonin protocol <0.10 but infiltrate noted on CT  ENDOCRINE A:   DMII   P:   CBG / SSI  NEUROLOGIC A:   No acute issues P:   D/C sedation PRN fentanyl if SOB post extubation  FAMILY  - Updates: No family bedside this AM, spoke with patient he does not wish for ETT to be replaced if he fails post extubation.  - Inter-disciplinary family meet or Palliative Care meeting due by:  day 7  The patient is critically ill with multiple organ systems failure and requires high complexity decision making  for assessment and support, frequent evaluation and titration of therapies, application of advanced monitoring technologies and extensive interpretation of multiple databases.   Critical Care Time devoted to patient care services described in this note is  45  Minutes. This time reflects time of care of this signee Dr Jennet Maduro. This critical care time does not reflect procedure time, or teaching time or supervisory time of PA/NP/Med student/Med Resident etc but could involve care discussion time.  Rush Farmer, M.D. [facility] Pulmonary/Critical Care Medicine. Pager: [phone]. After hours pager: [phone].  11/14/2016, 9:23 AM

## 2016-11-14 NOTE — Procedures (Signed)
Extubation Procedure Note  Patient Details:   Name: Ernest [name] DOB: [birth year] MRN: [id number]   Airway Documentation:     Evaluation  O2 sats: stable throughout Complications: No apparent complications Patient did tolerate procedure well. Bilateral Breath Sounds: Rhonchi   Yes   Pt extubated to 3L [place] per MD order. Pt stable throughout with no complications. Pt able to speak and has a strong productive cough.  Pt is DNR/DNI. Rt will continue to monitor.   Jesse Sans 11/14/2016, 10:18 AM

## 2016-11-14 NOTE — Progress Notes (Signed)
Nutrition Follow Up  DOCUMENTATION CODES:   Not applicable  INTERVENTION:    Await swallow evaluation/SLP recommendations   NUTRITION DIAGNOSIS:   Inadequate oral intake related to inability to eat as evidenced by NPO status, ongoing  GOAL:   Patient will meet greater than or equal to 90% of their needs, currently unmet  MONITOR:   Diet advancement, PO intake, Labs, Weight trends, I & O's  ASSESSMENT:   60 yo Male with PMH significant for COPD, 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  EMS was called and found him minimally responsive.     Pt extubated this AM. TF (Vital AF 1.2 formula) discontinued via OGT. Speech Path consulted for swallow evaluation. Palliative Care Team following. CBG's [id number].  Diet Order:  Diet NPO time specified  Skin:  Reviewed, no issues  Last BM:  12/17  Height:   Ht Readings from Last 1 Encounters:  11/11/16 '5\' 6"'$  (1.676 m)    Weight:   Wt Readings from Last 1 Encounters:  11/14/16 165 lb 5.5 oz (75 kg)    Ideal Body Weight:  64.5 kg  BMI:  Body mass index is 26.69 kg/m.  Estimated Nutritional Needs:   Kcal:  2000-2200  Protein:  100-110 gm  Fluid:  per MD  EDUCATION NEEDS:   No education needs identified at this time  Arthur Holms, RD, LDN Pager #: [phone] After-Hours Pager #: [phone]

## 2016-11-15 DIAGNOSIS — G894 Chronic pain syndrome: Secondary | ICD-10-CM

## 2016-11-15 DIAGNOSIS — F411 Generalized anxiety disorder: Secondary | ICD-10-CM

## 2016-11-15 LAB — CBC
HEMATOCRIT: 38.4 % — AB (ref 39.0–52.0)
Hemoglobin: 12.1 g/dL — ABNORMAL LOW (ref 13.0–17.0)
MCH: 28.3 pg (ref 26.0–34.0)
MCHC: 31.5 g/dL (ref 30.0–36.0)
MCV: 89.9 fL (ref 78.0–100.0)
PLATELETS: 104 10*3/uL — AB (ref 150–400)
RBC: 4.27 MIL/uL (ref 4.22–5.81)
RDW: 18 % — AB (ref 11.5–15.5)
WBC: 15.5 10*3/uL — AB (ref 4.0–10.5)

## 2016-11-15 LAB — GLUCOSE, CAPILLARY
GLUCOSE-CAPILLARY: 81 mg/dL (ref 65–99)
GLUCOSE-CAPILLARY: 99 mg/dL (ref 65–99)
Glucose-Capillary: 104 mg/dL — ABNORMAL HIGH (ref 65–99)
Glucose-Capillary: 109 mg/dL — ABNORMAL HIGH (ref 65–99)
Glucose-Capillary: 109 mg/dL — ABNORMAL HIGH (ref 65–99)
Glucose-Capillary: 137 mg/dL — ABNORMAL HIGH (ref 65–99)

## 2016-11-15 LAB — PHOSPHORUS: PHOSPHORUS: 2.5 mg/dL (ref 2.5–4.6)

## 2016-11-15 LAB — BASIC METABOLIC PANEL
ANION GAP: 10 (ref 5–15)
BUN: 28 mg/dL — ABNORMAL HIGH (ref 6–20)
CALCIUM: 9.1 mg/dL (ref 8.9–10.3)
CO2: 29 mmol/L (ref 22–32)
Chloride: 96 mmol/L — ABNORMAL LOW (ref 101–111)
Creatinine, Ser: 0.75 mg/dL (ref 0.61–1.24)
GLUCOSE: 94 mg/dL (ref 65–99)
POTASSIUM: 3.3 mmol/L — AB (ref 3.5–5.1)
Sodium: 135 mmol/L (ref 135–145)

## 2016-11-15 LAB — MAGNESIUM: Magnesium: 2 mg/dL (ref 1.7–2.4)

## 2016-11-15 MED ORDER — OXYCODONE-ACETAMINOPHEN 7.5-325 MG PO TABS
1.0000 | ORAL_TABLET | ORAL | Status: DC | PRN
  Administered 2016-11-15: 1 via ORAL
  Filled 2016-11-15: qty 1

## 2016-11-15 MED ORDER — GUAIFENESIN ER 600 MG PO TB12
600.0000 mg | ORAL_TABLET | Freq: Two times a day (BID) | ORAL | Status: DC
Start: 2016-11-15 — End: 2016-11-16
  Administered 2016-11-16: 600 mg via ORAL
  Filled 2016-11-15: qty 1

## 2016-11-15 MED ORDER — FENTANYL CITRATE (PF) 100 MCG/2ML IJ SOLN: 12.5000 ug | INTRAMUSCULAR | Status: DC | PRN

## 2016-11-15 MED ORDER — ORAL CARE MOUTH RINSE
15.0000 mL | Freq: Two times a day (BID) | OROMUCOSAL | Status: DC
  Administered 2016-11-16 – 2016-11-19 (×5): 15 mL via OROMUCOSAL

## 2016-11-15 MED ORDER — SODIUM CHLORIDE 0.9 % IV SOLN
8.0000 mg | Freq: Once | INTRAVENOUS | Status: AC
  Administered 2016-11-15: 8 mg via INTRAVENOUS
  Filled 2016-11-15: qty 4

## 2016-11-15 MED ORDER — FAMOTIDINE 20 MG PO TABS
20.0000 mg | ORAL_TABLET | Freq: Two times a day (BID) | ORAL | Status: DC
  Administered 2016-11-16 – 2016-11-19 (×7): 20 mg via ORAL
  Filled 2016-11-15 (×7): qty 1

## 2016-11-15 MED ORDER — CLONAZEPAM 0.5 MG PO TABS
0.5000 mg | ORAL_TABLET | Freq: Every day | ORAL | Status: DC
  Administered 2016-11-15 – 2016-11-18 (×4): 0.5 mg via ORAL
  Filled 2016-11-15 (×4): qty 1

## 2016-11-15 MED ORDER — POTASSIUM CHLORIDE CRYS ER 20 MEQ PO TBCR
40.0000 meq | EXTENDED_RELEASE_TABLET | Freq: Three times a day (TID) | ORAL | Status: AC
  Administered 2016-11-15 (×2): 40 meq via ORAL
  Filled 2016-11-15 (×2): qty 2

## 2016-11-15 MED ORDER — GI COCKTAIL ~~LOC~~
30.0000 mL | Freq: Once | ORAL | Status: DC
  Filled 2016-11-15: qty 30

## 2016-11-15 MED ORDER — PROMETHAZINE HCL 25 MG/ML IJ SOLN
12.5000 mg | Freq: Once | INTRAMUSCULAR | Status: AC
  Administered 2016-11-15: 12.5 mg via INTRAVENOUS
  Filled 2016-11-15: qty 1

## 2016-11-15 MED ORDER — MORPHINE SULFATE 15 MG PO TABS
7.5000 mg | ORAL_TABLET | ORAL | Status: DC | PRN
  Administered 2016-11-15: 15 mg via ORAL
  Filled 2016-11-15 (×2): qty 1

## 2016-11-15 MED ORDER — ALPRAZOLAM 0.25 MG PO TABS
0.2500 mg | ORAL_TABLET | ORAL | Status: DC | PRN
  Administered 2016-11-15 – 2016-11-19 (×10): 0.25 mg via ORAL
  Filled 2016-11-15 (×10): qty 1

## 2016-11-15 NOTE — Progress Notes (Signed)
Rehab Admissions Coordinator Note:  Patient was screened by Cleatrice Burke for appropriateness for an Inpatient Acute Rehab Consult per PT recommendation.   At this time, we are recommending an OT eval and follow pt's progress over the weekend before determining rehab venue options.   Cleatrice Burke 11/15/2016, 1:48 PM  I can be reached at [phone].

## 2016-11-15 NOTE — Progress Notes (Signed)
140 ml Fentanyl wasted in sink with Siri Cole, RN.

## 2016-11-15 NOTE — Evaluation (Signed)
Clinical/Bedside Swallow Evaluation Patient Details  Name: Ernest [name] MRN: [id number] Date of Birth: Feb 21, 1956  Today's Date: 11/15/2016 Time: SLP Start Time (ACUTE ONLY): 3846 SLP Stop Time (ACUTE ONLY): 0918 SLP Time Calculation (min) (ACUTE ONLY): 14 min  Past Medical History:  Past Medical History:  Diagnosis Date  . Acute bronchitis 11/05/2016  . Anxiety   . CAD (coronary artery disease)    Left Main 30% stenosis, LAD 20 - 30 % stenosis, first and second diagonal branchesat 40 - 50%  stenosis with small arteries, circumflex had 30% stenosis in the large obtuse marginal, RCA at 70 - 80%  stenosis [not felt to be occlusive after evaluation with flow wire], distal 50 - 60% stenosis - [name][  . Chronic insomnia   . COPD with asthma ([place]) 09/08/2007  . DDD (degenerative disc disease)   . Depression   . Diabetes mellitus without complication ([place]) 6/59/9357   no medications now 07/24/2016  . GERD (gastroesophageal reflux disease)   . Gout   . History of cardiovascular stress test    Myoview 7/16:  Diaphragmatic attenuation, no ischemia, EF 56%; Low Risk  . History of radiation therapy 11/10/13- 12/29/13   left lung 6600 cGy in 33 sessions  . Hx of colonoscopy   . Hyperlipidemia   . Hypertension    dr Percival Spanish  . Itching due to drug 03/19/2016  . Lung cancer ([place]) 10/04/13   LUL squamous cell lung cancer  . Lung cancer ([place])   . Nausea with vomiting 03/26/2016  . OSA (obstructive sleep apnea)    NPSG 09/10/10- AHI 11.3/hr  . PVD (peripheral vascular disease) ([place])    PTA/Stent right common iliac   Past Surgical History:  Past Surgical History:  Procedure Laterality Date  . ANGIOPLASTY    . ANTERIOR FUSION CERVICAL SPINE     cervical fusion  7 yrs ago ([facility])  . arm surgery     left elbow  . BACK SURGERY     lower  . c-spine surgery    . COLON SURGERY     '11 "Diverticulitis"  . COLONOSCOPY WITH PROPOFOL N/A 05/22/2015   Procedure: COLONOSCOPY WITH  PROPOFOL;  Surgeon: Inda Castle, MD;  Location: [facility] ENDOSCOPY;  Service: Endoscopy;  Laterality: N/A;  . HIP SURGERY     left 'bone graft taken"  . PERIPHERAL VASCULAR CATHETERIZATION N/A 08/10/2015   Procedure: Lower Extremity Angiography;  Surgeon: Lorretta Harp, MD; Distal Ao OK, L-EIA stent OK, R-CIA 100% s/p overlapping 7 mm x 38 mm ICast stents, 50-60% R-CFA      . SHOULDER SURGERY     right  . SPINAL FUSION  03/05/2007   L4-L5  . VIDEO BRONCHOSCOPY WITH ENDOBRONCHIAL NAVIGATION N/A 10/04/2013   Procedure: VIDEO BRONCHOSCOPY WITH ENDOBRONCHIAL NAVIGATION;  Surgeon: Collene Gobble, MD;  Location: [facility] OR;  Service: Thoracic;  Laterality: N/A;   HPI:  60 y.o.malewith past medical history of COPD, stage IV NSCLC on active chemo with docetaxel and Cyramza, DMII, CAD, HTN, H LD, OSA and PVDadmitted on 12/18/2017with progressive shortness of breath that awakened him from sleep and was quite severe. He has been more short of breath than usual for a few days. This was accompanied by congestion. His wife reports he felt like he had phlegm, but couldn't get it up.[name] He went to bed doing okay (he always has to sleep sitting up), but then awoke around 3am and needed to go to the hospital. EMS was called and  found him minimally responsive. CXR with perhaps some interval enlargement in chronic right sided findings as well as interstitial prominence concerning for possible infection/postobstructive pneumonia vs acute edema. Pt intubated from 12/18 to 12/21. Pt and wife report some dry mouth/mucositis prior to admission with some esophageal dysphagia with solids.    Assessment / Plan / Recommendation Clinical Impression  Pt demonstrates no immediate or delayed signs of aspiration with thin liquids, even with 3 oz water was consumed consecutively via straw. Pt also tolerated 2 oz of puree without difficulty. He refused solid food trial and did admit to some esophageal globus and need for liquid wash  with solids prior to admit, likely due to chemotherapy. Pt may initaite a full liquid diet and advance to solids when he verbalizes readiness to RN/MD. Will need upright positioning and frequent oral care, follow solids with liquids which is already his habit. SLP will f/u for tolerance.     Aspiration Risk  Mild aspiration risk    Diet Recommendation Thin liquid   Liquid Administration via: Cup;Straw Medication Administration: Whole meds with liquid Supervision: Patient able to self feed Compensations: Slow rate;Small sips/bites Postural Changes: Seated upright at 90 degrees;Remain upright for at least 30 minutes after po intake    Other  Recommendations Oral Care Recommendations: Oral care QID   Follow up Recommendations 24 hour supervision/assistance      Frequency and Duration min 1 x/week  2 weeks       Prognosis Prognosis for Safe Diet Advancement: Good      Swallow Study   General HPI: 60 y.o.malewith past medical history of COPD, stage IV NSCLC on active chemo with docetaxel and Cyramza, DMII, CAD, HTN, H LD, OSA and PVDadmitted on 12/18/2017with progressive shortness of breath that awakened him from sleep and was quite severe. He has been more short of breath than usual for a few days. This was accompanied by congestion. His wife reports he felt like he had phlegm, but couldn't get it up.[name] He went to bed doing okay (he always has to sleep sitting up), but then awoke around 3am and needed to go to the hospital. EMS was called and found him minimally responsive. CXR with perhaps some interval enlargement in chronic right sided findings as well as interstitial prominence concerning for possible infection/postobstructive pneumonia vs acute edema. Pt intubated from 12/18 to 12/21. Pt and wife report some dry mouth/mucositis prior to admission with some esophageal dysphagia with solids.  Type of Study: Bedside Swallow Evaluation Previous Swallow Assessment: none Diet Prior to  this Study: NPO Temperature Spikes Noted: No Respiratory Status: Nasal cannula History of Recent Intubation: Yes Length of Intubations (days): 4 days Date extubated: 11/14/16 Behavior/Cognition: Alert;Cooperative;Pleasant mood Oral Cavity Assessment: Within Functional Limits Oral Care Completed by SLP: Yes Oral Cavity - Dentition: Adequate natural dentition Vision: Functional for self-feeding Self-Feeding Abilities: Able to feed self Patient Positioning: Upright in bed Baseline Vocal Quality: Hoarse Volitional Cough: Strong Volitional Swallow: Able to elicit    Oral/Motor/Sensory Function Overall Oral Motor/Sensory Function: Within functional limits   Ice Chips Ice chips: Within functional limits Presentation: Spoon;Self Fed   Thin Liquid Thin Liquid: Within functional limits Presentation: Cup;Straw;Self Fed    Nectar Thick Nectar Thick Liquid: Not tested   Honey Thick Honey Thick Liquid: Not tested   Puree Puree: Within functional limits Presentation: Self Fed;Spoon   Solid   GO   Solid: Not tested (pt refused)       Herbie Baltimore, [place] CCC-SLP [phone]  Nyja Westbrook, Katherene Ponto 11/15/2016,9:23 AM

## 2016-11-15 NOTE — Progress Notes (Signed)
Daily Progress Note   Patient Name: Ernest [name]       Date: 11/15/2016 DOB: Sep 01, 1956  Age: 60 y.o. MRN#: [id number] Attending Physician: Rush Farmer, MD Primary Care Physician: [name], MD Admit Date: 11/11/2016  Reason for Consultation/Follow-up: Establishing goals of care, Non pain symptom management and Pain control  Subjective: Sitting in bedside chair at time of examination today.  He reports only complaint is pain.  He has not requested any of his PRN medications today and states he "didn't think about it" when asked if there is a reason he has not been reporting pain to nurse.  PCCM started back on lower dose narcotics (Percocet 7.5 Q 4 hr prn).  I asked hie nurse to give him a dose to see if this is effective.  Length of Stay: 4  Current Medications: Scheduled Meds:  . chlorhexidine gluconate (MEDLINE KIT)  15 mL Mouth Rinse BID  . clonazePAM  0.5 mg Oral QHS  . enoxaparin (LOVENOX) injection  40 mg Subcutaneous Q24H  . famotidine  20 mg Oral BID  . gi cocktail  30 mL Oral Once  . guaiFENesin  600 mg Oral BID  . insulin aspart  0-20 Units Subcutaneous Q4H  . ipratropium-albuterol  3 mL Nebulization Q4H  . mouth rinse  15 mL Mouth Rinse QID  . piperacillin-tazobactam (ZOSYN)  IV  3.375 g Intravenous Q8H  . potassium chloride  40 mEq Oral TID  . sennosides  5 mL Per Tube BID  . vancomycin  1,000 mg Intravenous Q8H    Continuous Infusions:   PRN Meds: sodium chloride, albuterol, ALPRAZolam, oxyCODONE-acetaminophen  Physical Exam   General: Alert, awake, in no acute distress.  HEENT: No bruits, no goiter, no JVD Heart: Regular rate and rhythm. No murmur appreciated. Lungs: Fair air movement, diminished at bases Abdomen: Soft, nontender,  nondistended, positive bowel sounds.  Ext: No significant edema Skin: Warm and dry Neuro: Grossly intact, nonfocal.         Vital Signs: BP 129/74   Pulse 76   Temp 97.5 F (36.4 C) (Oral)   Resp (!) 24   Ht 5' 6"  (1.676 m)   Wt 76.4 kg (168 lb 6.9 oz)   SpO2 98%   BMI 27.19 kg/m  SpO2: SpO2: 98 % O2 Device: O2 Device:  Nasal Cannula O2 Flow Rate: O2 Flow Rate (L/min): 3 L/min  Intake/output summary:  Intake/Output Summary (Last 24 hours) at 11/15/16 1552 Last data filed at 11/15/16 0700  Gross per 24 hour  Intake              160 ml  Output              860 ml  Net             -700 ml   LBM: Last BM Date: 11/10/16 Baseline Weight: Weight: 77.5 kg (170 lb 13.7 oz) Most recent weight: Weight: 76.4 kg (168 lb 6.9 oz)       Palliative Assessment/Data:      Patient Active Problem List   Diagnosis Date Noted  . History of ETT   . Goals of care, counseling/discussion   . Acute respiratory failure with hypoxia and hypercapnia ([place]) 11/11/2016  . Acute bronchitis 11/05/2016  . Tinea corporis 10/31/2016  . Hyponatremia 10/22/2016  . Anxiety 10/22/2016  . Cancer associated pain 08/27/2016  . Acute seasonal allergic rhinitis due to pollen 08/27/2016  . Insomnia due to anxiety and fear 08/01/2016  . Therapeutic opioid-induced constipation (OIC) 07/18/2016  . Encounter for antineoplastic chemotherapy 02/20/2016  . 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 COPD exacerbation ([place]) 11/24/2015  . Rhinitis, nonallergic, chronic 09/22/2015  . 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 Claudication ([place]) 08/10/2015  . Coronary artery disease involving native coronary artery of native heart without angina pectoris 05/17/2015  . Diabetes mellitus without complication ([place]) 00/93/8182  . Palliative care encounter   . Chronic respiratory failure with hypercapnia ([place])   . Chronic pain syndrome   . Chronic respiratory failure with hypoxia ([place]) 12/15/2014  . Non-small cell  carcinoma of lung, right ([place]) 10/07/2013  . Obstructive sleep apnea 09/24/2010  . Peripheral vascular disease ([place]) 05/15/2010  . Hyperlipidemia with target LDL less than 70 05/16/2009  . GOUT 09/08/2007  . Depression with anxiety 09/08/2007  . Essential hypertension 09/08/2007  . COPD mixed type ([place]) 09/08/2007  . GERD 09/08/2007    Palliative Care Assessment & Plan   Patient Profile: 60 y.o.malewith past medical history of COPD, stage IV NSCLC on active chemo with docetaxel and Cyramza, DMII, CAD, HTN, H LD, OSA and PVDadmitted on 12/18/2017with progressive shortness of breath.   Recommendations/Plan: - Goal is to continue current care with hopes of improving and being able to continue with current treatment for NSCLC.  - He wants to be reintubated if necessary to make it to Christmas.  We discussed that there is a high possibility that he may not be able to be extubated again. He would want to have one way extubation with focus on comfort sometime after Christmas if reintubated and it does not appear that he will be able to come of vent successfully.  He also desires pressors/ACLS medications if needed through Christmas as well.  He does not want chest compressions (CPR) in the event of cardiac arrest.  He does not want defibrillation.  He would never want trach or PEG. - Primary has started reintroducing home medications for pain and anxiety.  Reported to me that he was having pain but has not asked for pain medications.  I let his nurse know and she will give him a dose now.  I think that it is likely that he will need to resume both a long and short acting opioid.  Will continue to titrate over the next couple of  days based upon usage of prn doses over the next 24 hours.  Goals of Care and Additional Recommendations:  Limitations on Scope of Treatment: No Artificial Feeding and No Tracheostomy  Code Status:    Code Status Orders        Start     Ordered   11/14/16 1427   Limited resuscitation (code)  Continuous    Question Answer Comment  In the event of cardiac or respiratory ARREST: Initiate Code Blue, Call Rapid Response Yes   In the event of cardiac or respiratory ARREST: Perform CPR No   In the event of cardiac or respiratory ARREST: Perform Intubation/Mechanical Ventilation Yes   In the event of cardiac or respiratory ARREST: Use NIPPV/BiPAp only if indicated Yes   In the event of cardiac or respiratory ARREST: Administer ACLS medications if indicated Yes   In the event of cardiac or respiratory ARREST: Perform Defibrillation or Cardioversion if indicated No      11/14/16 1426    Code Status History    Date Active Date Inactive Code Status Order ID Comments User Context   11/13/2016 10:56 AM 11/14/2016  2:26 PM DNR [id number]  Rush Farmer, MD Inpatient   11/11/2016  5:13 AM 11/13/2016 10:56 AM Full Code [id number]  Dannielle Burn, MD ED   05/18/2016  2:00 PM 05/23/2016  4:22 PM Full Code [id number]  Waldemar Dickens, MD ED   04/21/2016  6:29 PM 04/25/2016  8:37 PM Full Code [id number]  Lily Kocher, MD Inpatient   01/18/2016  4:43 AM 01/21/2016  5:44 PM Full Code [id number]  Etta Quill, DO ED   11/24/2015  4:44 AM 11/30/2015  4:42 PM Full Code [id number]  Etta Quill, DO ED   08/10/2015  4:42 PM 08/11/2015  5:54 PM Full Code [id number]  Lorretta Harp, MD Inpatient   04/11/2015  3:17 AM 04/15/2015  2:32 PM Full Code [id number]  Rahul Dianna Rossetti, PA-C ED   02/15/2014  4:02 AM 02/17/2014 10:40 PM Full Code [id number]  Berle Mull, MD ED    Advance Directive Documentation   Flowsheet Row Most Recent Value  Type of Advance Directive  Healthcare Power of Attorney  Pre-existing out of facility DNR order (yellow form or pink MOST form)  No data  "MOST" Form in Place?  No data       Prognosis:   Unable to determine  Discharge Planning:  Home with Home Health most likely  Care plan was discussed with patient, RN  Thank you for allowing the Palliative  Medicine Team to assist in the care of this patient.   Time In: 1520 Time Out: 1540 Total Time 20 Prolonged Time Billed No      Greater than 50%  of this time was spent counseling and coordinating care related to the above assessment and plan.  Micheline Rough, MD  Please contact Palliative Medicine Team phone at [phone] for questions and concerns.

## 2016-11-15 NOTE — Telephone Encounter (Signed)
I left message on Anita's phone of Mohamed's note below. I also instructed Rodena Piety that I was cancelling lab on 12/26 and to keep appt 1/2 and to let us know when Zackeriah is out of the hospital.

## 2016-11-15 NOTE — Telephone Encounter (Signed)
Spoke to  [place].

## 2016-11-15 NOTE — Care Management Important Message (Signed)
Important Message  Patient Details  Name: Ernest [name] MRN: [id number] Date of Birth: [birth year]   Medicare Important Message Given:  Yes    Nathen May 11/15/2016, 10:19 AM

## 2016-11-15 NOTE — Evaluation (Signed)
Physical Therapy Evaluation Patient Details Name: Ernest [name] MRN: [id number] DOB: Jan 21, 1956 Today's Date: 11/15/2016   History of Present Illness  60 y.o. 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    Clinical Impression  Patient demonstrates deficits in functional mobility as indicated below. Will need continued skilled PT to address deficits and maximize function. Will see as indicated and progress as tolerated. At this time, feel patient would benefit from post acute rehabilitation, however, if patient progresses well over the next few days, may consider HHPT.    Follow Up Recommendations CIR;Supervision/Assistance - 24 hour (pending progress)    Equipment Recommendations  Rolling walker with 5" wheels;None recommended by PT    Recommendations for Other Services       Precautions / Restrictions Precautions Precautions: Fall Precaution Comments: watch o2 sats Restrictions Weight Bearing Restrictions: No      Mobility  Bed Mobility Overal bed mobility: Needs Assistance Bed Mobility: Supine to Sit     Supine to sit: Supervision     General bed mobility comments: VCs for safety, supervision for line management  Transfers Overall transfer level: Needs assistance Equipment used: Rolling walker (2 wheeled) Transfers: Sit to/from Stand Sit to Stand: Min guard         General transfer comment: Min guard for safety, no physical assist required. Able to power up to stnading with modified hand placement  Ambulation/Gait Ambulation/Gait assistance: Min assist Ambulation Distance (Feet): 30 Feet Assistive device: Rolling walker (2 wheeled) Gait Pattern/deviations: Step-through pattern;Decreased stride length;Drifts right/left;Narrow base of support Gait velocity: decreased   General Gait Details: some  instability with ambulation, min assist for stability, one over LOB  Stairs            Wheelchair Mobility    Modified Rankin (Stroke Patients Only)       Balance Overall balance assessment: Needs assistance   Sitting balance-Leahy Scale: Good       Standing balance-Leahy Scale: Poor Standing balance comment: reliance on UE support for stability at this time                             Pertinent Vitals/Pain      Home Living Family/patient expects to be discharged to:: Private residence Living Arrangements: Spouse/significant other Available Help at Discharge: Family;Available PRN/intermittently Type of Home: House Home Access: Stairs to enter   Entrance Stairs-Number of Steps: 2 Home Layout: One level Home Equipment: Walker - 2 wheels;Cane - single point      Prior Function Level of Independence: Independent         Comments: occasional use of cane     Hand Dominance   Dominant Hand: Right    Extremity/Trunk Assessment   Upper Extremity Assessment Upper Extremity Assessment: Generalized weakness    Lower Extremity Assessment Lower Extremity Assessment: Generalized weakness       Communication   Communication: No difficulties  Cognition Arousal/Alertness: Awake/alert Behavior During Therapy: WFL for tasks assessed/performed Overall Cognitive Status: Within Functional Limits for tasks assessed                      General Comments      Exercises     Assessment/Plan    PT Assessment Patient needs continued PT services  PT Problem  List Decreased strength;Decreased activity tolerance;Decreased balance;Decreased mobility;Cardiopulmonary status limiting activity          PT Treatment Interventions DME instruction;Gait training;Stair training;Functional mobility training;Therapeutic activities;Therapeutic exercise;Balance training;Patient/family education    PT Goals (Current goals can be found in the Care Plan  section)  Acute Rehab PT Goals Patient Stated Goal: to go home PT Goal Formulation: With patient Time For Goal Achievement: 11/29/16 Potential to Achieve Goals: Good    Frequency Min 3X/week   Barriers to discharge        Co-evaluation               End of Session Equipment Utilized During Treatment: Gait belt;Oxygen (6 liters) Activity Tolerance: Patient limited by fatigue Patient left: in chair;with call bell/phone within reach;with family/visitor present Nurse Communication: Mobility status         Time: 3494-9447 PT Time Calculation (min) (ACUTE ONLY): 24 min   Charges:   PT Evaluation $PT Eval Moderate Complexity: 1 Procedure PT Treatments $Gait Training: 8-22 mins   PT G Codes:        Duncan Dull December 14, 2016, 1:33 PM  Alben Deeds, [place] DPT  [phone]

## 2016-11-15 NOTE — Progress Notes (Signed)
Pharmacy Antibiotic Note  Ernest [name] is a 60 y.o. male with lung cancer, on chemotherapy, admitted on 11/11/2016 with acute respiratory failure and leukocytosis and started empiric vanc/zosyn.  All cx final and neg  Plan: Continue vanc and zosyn x 1 week Abx to end on 12/25  Height: '5\' 6"'$  (167.6 cm) Weight: 168 lb 6.9 oz (76.4 kg) IBW/kg (Calculated) : 63.8  Temp (24hrs), Avg:97.7 F (36.5 C), Min:97.5 F (36.4 C), Max:98.1 F (36.7 C)   Recent Labs Lab 11/11/16 0340 11/11/16 0352 11/11/16 0504 11/11/16 1349 11/11/16 1926 11/12/16 0231 11/13/16 0328 11/13/16 1356 11/14/16 0631 11/15/16 0534  WBC 29.5*  --   --   --   --  16.3* 15.2*  --  11.4* 15.5*  CREATININE 0.84 0.80  --   --   --  0.83 0.75  --  0.81 0.75  LATICACIDVEN  --   --  1.40 3.4* 2.8*  --   --   --   --   --   VANCOTROUGH  --   --   --   --   --   --   --  12*  --   --     Estimated Creatinine Clearance: 87.5 mL/min (by C-G formula based on SCr of 0.75 mg/dL).    Allergies  Allergen Reactions  . Carboplatin Shortness Of Breath, Swelling and Rash    Swelling of lips, rash on face,eyes and head   Levester Fresh, PharmD, BCPS, BCCCP Clinical Pharmacist

## 2016-11-15 NOTE — Progress Notes (Signed)
PULMONARY / CRITICAL CARE MEDICINE   Name: Ernest [name] MRN: [id number] DOB: 09-30-1956    ADMISSION DATE:  11/11/2016 CONSULTATION DATE:   REFERRING MD:  EDP - WARD  CHIEF COMPLAINT:  Shortness of breath  HISTORY OF PRESENT ILLNESS:   Ernest [name] is a 63M with PMH significant for COPD, stage IV NSCLC on active chemo with docetaxel and Cyramza, DMII, CAD, HTN, H LD, OSA and PVD, who presents with acute onset shortness of breath that awakened him from sleep and was quite severe. He has been more short of breath than usual for a few days. This was accompanied by congestion. No fever / chills / diarrhea / nausea / vomiting. His wife reports he felt like he had phlegm, but couldn't get it up. No hemoptysis. He was treating his congestion with OTC mucinex and had increased his rescue albuterol use. He went to bed doing okay (he always has to sleep sitting up), but then awoke around 3am and needed to go to the hospital. EMS was called and found him minimally responsive. His respirations were assisted with BVM en route. Once in the ED, he was intubated.   Labs showed elevated WBC (which seems chronic for him and may be chemo related), normal renal function, normal BNP, normal troponin, normal lactate. ABG was consistent with a respiratory acidosis. CXR with perhaps some interval enlargement in chronic right sided findings as well as interstitial prominence concerning for possible infection/postobstructive pneumonia vs acute edema.  Since being intubated, his mental status has improved. He is arousable and briskly follows commands. He denies pain.   SUBJECTIVE:  Weaning great this AM, ready for extubation.  VITAL SIGNS: BP 129/74   Pulse 76   Temp 97.5 F (36.4 C) (Oral)   Resp (!) 24   Ht '5\' 6"'$  (1.676 m)   Wt 168 lb 6.9 oz (76.4 kg)   SpO2 98%   BMI 27.19 kg/m   HEMODYNAMICS:    VENTILATOR SETTINGS:    INTAKE / OUTPUT: I/O last 3 completed shifts: In: 1544.3 [I.V.:559.3;  NG/GT:635; IV Piggyback:350] Out: 2555 [Urine:2555]  PHYSICAL EXAMINATION:  General Well nourished, well developed, intubated, Alert and interactive.  HEENT No gross abnormalities. OETT, OGT in place  Pulmonary Decreased BS at the bases  Cardiovascular Normal rate, regular rhythm. S1, s2. No m/r/g. Distal pulses palpable.  Abdomen Soft, NT, ND and +BS  Musculoskeletal Grossly normal   Lymphatics No cervical, supraclavicular or axillary adenopathy.   Neurologic Grossly intact. No focal deficits.   Skin/Integuement No cyanosis, no clubbing, no edema. Dry skin with scattered nummular lesions   LABS:  BMET  Recent Labs Lab 11/13/16 0328 11/14/16 0631 11/15/16 0534  NA 133* 135 135  K 4.1 3.4* 3.3*  CL 93* 94* 96*  CO2 30 33* 29  BUN 22* 31* 28*  CREATININE 0.75 0.81 0.75  GLUCOSE 97 133* 94   Electrolytes  Recent Labs Lab 11/13/16 0328 11/14/16 0631 11/15/16 0534  CALCIUM 9.0 9.2 9.1  MG 2.0 2.2 2.0  PHOS 4.4 4.2 2.5   CBC  Recent Labs Lab 11/13/16 0328 11/14/16 0631 11/15/16 0534  WBC 15.2* 11.4* 15.5*  HGB 12.8* 12.5* 12.1*  HCT 41.2 39.3 38.4*  PLT 118* 88* 104*   Coag's No results for input(s): APTT, INR in the last 168 hours.  Sepsis Markers  Recent Labs Lab 11/11/16 0504 11/11/16 1349 11/11/16 1926  LATICACIDVEN 1.40 3.4* 2.8*   ABG  Recent Labs Lab 11/12/16 0342 11/13/16 0347 11/14/16  0545  PHART 7.403 7.512* 7.451*  PCO2ART 47.1 42.3 49.9*  PO2ART 112* 115* 82.4*   Liver Enzymes No results for input(s): AST, ALT, ALKPHOS, BILITOT, ALBUMIN in the last 168 hours. Cardiac Enzymes No results for input(s): TROPONINI, PROBNP in the last 168 hours.  Glucose  Recent Labs Lab 11/14/16 1224 11/14/16 1540 11/14/16 1943 11/14/16 2325 11/15/16 0359 11/15/16 0833  GLUCAP 117* 125* 81 94 81 104*   Imaging No results found. STUDIES:  None  CULTURES: Blood cx x 2  12/18 >>NTD UA 12/18>>>NTD Resp quant  12/18>>>NTD RVP>>>NTD  ANTIBIOTICS: Vancomycin 12/18 >> Zosyn 12/18 >>  SIGNIFICANT EVENTS:   LINES/TUBES: OETT 12/18>>>12/21 OGT 12/18>>>12/21 PIV  DISCUSSION: Ernest [name] is a 77M with multiple comorbidities admitted with what appears to be an acute exacerbation of asthma/COPD. He has wheezing, increased cough and a change in sputum from his baseline. His wife does not report any other infectious symptoms, but it seems reasonable to err on the side of caution given his ongoing chemotherapy and elevated WBC. We will continue antibiotics for now, check a viral panel, continue DuoNebs q 4 with albuterol q2h prn, continue steroids and hope that he will be able to wean from the ventilator.   ASSESSMENT / PLAN:  PULMONARY A: Acute hypoxemic/hypercapneic respiratory failure COPD/asthma exacerbation Stage IV NSCLC 3 L [place] at home is baseline P:   Extubated 12/21 4L [place] while awake Ambulate DuoNebs q 4h scheduled Albuterol q2h prn Changed prednisone to IV solumedrol, continue for now Continue vanc/zosyn No trach/peg per wife, DNR status.  One way extubation.  CARDIOVASCULAR A:  No acute issues Hx CAD P:  Tele monitor Diureses MAP> 65  RENAL A:   No acute issues P:   Trend BMP Monitor UOP Pharmacy to adjust dosing as necessary KVO IVF Continue diureses again today, 20 mg IV q6 x3 doses Replace electrolytes as indicated  GASTROINTESTINAL A:   No acute issues P:   Pepcid for gi ppx D/C TF post extubation Speech evaluation  HEMATOLOGIC A:   Leukocytosis P:  Trend CBC Continue Abx: vanc/zosyn.  INFECTIOUS A:   Concern for sepsis 2/2 pneumonia Leukocytosis P:   Continue vanc/zosyn til cultures result RVP negative, d/c isolation Follow blood cultures Procalcitonin protocol <0.10 but infiltrate noted on CT  ENDOCRINE A:   DMII   P:   CBG / SSI  NEUROLOGIC A:   No acute issues P:   D/C sedation Ativan q 4  prn for anxiety post  extubation Fentanyl 12.5 q 2 prn pain post extubation Consider restarting xanax and morphine at home doses.  General Discussion; Consider transferring out of unit  Will need to address transition to PO pain/ anxiety medications as he was on multiple medications at home for pain and anxiety.   Magdalen Spatz, AGACNP-BC [facility] Pulmonary/Critical Care Medicine Pager # [phone] 11/15/2016  Attending Note:  60 year old male with stage 4 lung cancer who was intubated for post obstructive PNA.  Now extubated.  Was DNR but reversed to intubation only.  On exam, decreased BS on the right.  I reviewed CXR myself, opacities noted.  Discussed with TRH-MD and PCCM-NP.    Acute respiratory failure:  - Monitor of airway protection  - Will need oncology to evaluate  - Treat PNA  Post obstructive PNA  - Vanc/zosyn  - F/u on cultures.  Hypoxemia:  - Titrate O2 for sat of 88-92%.  - May need ambulatory desat study for home O2.  GOC:  -  Intubation only.  - Palliative care consulted  Chronic pain:  - Restart lower dose of home narcs.  - Palliative care consulted  Anxiety:  - Restart home dose of xanax.  Insomnia:  - Clonazepam at home.  PCCM will sign off, please call back if needed.  Patient seen and examined, agree with above note.  I dictated the care and orders written for this patient under my direction.  Rush Farmer, MD [phone]

## 2016-11-16 DIAGNOSIS — G4733 Obstructive sleep apnea (adult) (pediatric): Secondary | ICD-10-CM

## 2016-11-16 DIAGNOSIS — J189 Pneumonia, unspecified organism: Secondary | ICD-10-CM

## 2016-11-16 DIAGNOSIS — E118 Type 2 diabetes mellitus with unspecified complications: Secondary | ICD-10-CM

## 2016-11-16 DIAGNOSIS — C3491 Malignant neoplasm of unspecified part of right bronchus or lung: Secondary | ICD-10-CM

## 2016-11-16 DIAGNOSIS — I2510 Atherosclerotic heart disease of native coronary artery without angina pectoris: Secondary | ICD-10-CM

## 2016-11-16 LAB — CULTURE, BLOOD (ROUTINE X 2)
CULTURE: NO GROWTH
CULTURE: NO GROWTH

## 2016-11-16 LAB — GLUCOSE, CAPILLARY
GLUCOSE-CAPILLARY: 86 mg/dL (ref 65–99)
GLUCOSE-CAPILLARY: 83 mg/dL (ref 65–99)
Glucose-Capillary: 98 mg/dL (ref 65–99)
Glucose-Capillary: 109 mg/dL — ABNORMAL HIGH (ref 65–99)
Glucose-Capillary: 93 mg/dL (ref 65–99)

## 2016-11-16 LAB — CBC
HEMATOCRIT: 37 % — AB (ref 39.0–52.0)
Hemoglobin: 11.7 g/dL — ABNORMAL LOW (ref 13.0–17.0)
MCH: 28.6 pg (ref 26.0–34.0)
MCHC: 31.6 g/dL (ref 30.0–36.0)
MCV: 90.5 fL (ref 78.0–100.0)
PLATELETS: 114 10*3/uL — AB (ref 150–400)
RBC: 4.09 MIL/uL — AB (ref 4.22–5.81)
RDW: 17.4 % — ABNORMAL HIGH (ref 11.5–15.5)
WBC: 14.4 10*3/uL — AB (ref 4.0–10.5)

## 2016-11-16 LAB — BASIC METABOLIC PANEL
ANION GAP: 9 (ref 5–15)
BUN: 19 mg/dL (ref 6–20)
CO2: 31 mmol/L (ref 22–32)
Calcium: 8.7 mg/dL — ABNORMAL LOW (ref 8.9–10.3)
Chloride: 94 mmol/L — ABNORMAL LOW (ref 101–111)
Creatinine, Ser: 0.63 mg/dL (ref 0.61–1.24)
GFR calc Af Amer: >60 mL/min (ref >60)
GLUCOSE: 97 mg/dL (ref 65–99)
POTASSIUM: 3.6 mmol/L (ref 3.5–5.1)
Sodium: 134 mmol/L — ABNORMAL LOW (ref 135–145)

## 2016-11-16 LAB — PHOSPHORUS: Phosphorus: 2.2 mg/dL — ABNORMAL LOW (ref 2.5–4.6)

## 2016-11-16 LAB — MAGNESIUM: MAGNESIUM: 2.1 mg/dL (ref 1.7–2.4)

## 2016-11-16 MED ORDER — METHYLPREDNISOLONE SODIUM SUCC 125 MG IJ SOLR: 60.0000 mg | INTRAMUSCULAR | Status: DC

## 2016-11-16 MED ORDER — METOCLOPRAMIDE HCL 5 MG/ML IJ SOLN
10.0000 mg | Freq: Three times a day (TID) | INTRAMUSCULAR | Status: DC | PRN
  Administered 2016-11-16: 10 mg via INTRAVENOUS
  Filled 2016-11-16: qty 2

## 2016-11-16 MED ORDER — CHLORPROMAZINE HCL 25 MG/ML IJ SOLN: 25.0000 mg | Freq: Once | INTRAMUSCULAR | Status: DC

## 2016-11-16 MED ORDER — PROMETHAZINE HCL 25 MG/ML IJ SOLN
25.0000 mg | Freq: Once | INTRAMUSCULAR | Status: AC
  Administered 2016-11-16: 25 mg via INTRAVENOUS
  Filled 2016-11-16: qty 1

## 2016-11-16 MED ORDER — POTASSIUM PHOSPHATES 15 MMOLE/5ML IV SOLN
30.0000 mmol | Freq: Once | INTRAVENOUS | Status: AC
  Administered 2016-11-16: 30 mmol via INTRAVENOUS
  Filled 2016-11-16: qty 10

## 2016-11-16 MED ORDER — ONDANSETRON 4 MG PO TBDP
8.0000 mg | ORAL_TABLET | Freq: Three times a day (TID) | ORAL | Status: DC | PRN
  Administered 2016-11-16: 8 mg via ORAL
  Filled 2016-11-16: qty 2

## 2016-11-16 MED ORDER — METHYLPREDNISOLONE SODIUM SUCC 125 MG IJ SOLR
60.0000 mg | INTRAMUSCULAR | Status: DC
  Administered 2016-11-16 – 2016-11-18 (×3): 60 mg via INTRAVENOUS
  Filled 2016-11-16 (×4): qty 2

## 2016-11-16 MED ORDER — METOCLOPRAMIDE HCL 5 MG/ML IJ SOLN: 5.0000 mg | Freq: Three times a day (TID) | INTRAMUSCULAR | Status: DC | PRN

## 2016-11-16 MED ORDER — DM-GUAIFENESIN ER 30-600 MG PO TB12
1.0000 | ORAL_TABLET | Freq: Two times a day (BID) | ORAL | Status: DC
  Administered 2016-11-16 – 2016-11-17 (×2): 1 via ORAL
  Filled 2016-11-16 (×2): qty 1

## 2016-11-16 MED ORDER — ACETYLCYSTEINE 20 % IN SOLN
4.0000 mL | Freq: Two times a day (BID) | RESPIRATORY_TRACT | Status: DC
  Administered 2016-11-17 – 2016-11-19 (×2): 4 mL via RESPIRATORY_TRACT
  Filled 2016-11-16 (×6): qty 4

## 2016-11-16 MED ORDER — CHLORPROMAZINE HCL 25 MG/ML IJ SOLN
12.5000 mg | Freq: Once | INTRAMUSCULAR | Status: AC
  Administered 2016-11-16: 12.5 mg via INTRAVENOUS
  Filled 2016-11-16: qty 0.5

## 2016-11-16 NOTE — Progress Notes (Signed)
Pharmacy Antibiotic Note  Ernest [name] is a 60 y.o. male admitted on 11/11/2016 with pneumonia.  Pharmacy has been consulted for Vancomycin and Zosyn dosing. WBC is trending down slowly. PCT is negative. Cultures are no growth to date. Infiltrate noted on CT.   Stop dates for 7 days total are in place.  SCr is stable, good UOP. VT was assessed and dose adjusted for targeted trough of 15-20.   Plan: This patient's current antibiotics will be continued without adjustments. Pharmacy will sign off as stop dates are in place and no further dose adjustments are needed.   Height: '5\' 6"'$  (167.6 cm) Weight: 168 lb 6.9 oz (76.4 kg) IBW/kg (Calculated) : 63.8  Temp (24hrs), Avg:97.8 F (36.6 C), Min:97.4 F (36.3 C), Max:98.3 F (36.8 C)   Recent Labs Lab 11/11/16 0504 11/11/16 1349 11/11/16 1926 11/12/16 0231 11/13/16 0328 11/13/16 1356 11/14/16 0631 11/15/16 0534 11/16/16 0229  WBC  --   --   --  16.3* 15.2*  --  11.4* 15.5* 14.4*  CREATININE  --   --   --  0.83 0.75  --  0.81 0.75 0.63  LATICACIDVEN 1.40 3.4* 2.8*  --   --   --   --   --   --   VANCOTROUGH  --   --   --   --   --  12*  --   --   --     Estimated Creatinine Clearance: 87.5 mL/min (by C-G formula based on SCr of 0.63 mg/dL).    Allergies  Allergen Reactions  . Carboplatin Shortness Of Breath, Swelling and Rash    Swelling of lips, rash on face,eyes and head    Antimicrobials this admission: Vancomycin 12/18 >> Zosyn 12/18>>  Dose adjustments this admission: 12/20 VT = 12 on 750 units/hr, drawn 1 hr late, increased to 1g Q 8  Microbiology results: 12/18BCx: ngtd 12/18 TA: normal flora 12/18MRSA PCR: neg  Thank you for allowing pharmacy to be a part of this patient's care.  Sloan Leiter, PharmD, BCPS Clinical Pharmacist [phone] 11/16/2016 2:25 PM

## 2016-11-16 NOTE — Progress Notes (Signed)
Speech Language Pathology Treatment: Dysphagia  Patient Details Name: Ernest [name] MRN: [id number] DOB: 23-Sep-1956 Today's Date: 11/16/2016 Time: 1250-1300 SLP Time Calculation (min) (ACUTE ONLY): 10 min  Assessment / Plan / Recommendation Clinical Impression  Brief f/u after yesterday's clinical swallow evaluation.  Diet has been advanced to regular solids, thin liquids.  Limited appetite today due to nausea.  Pt consumed thin water with adequate oral attention, brisk swallow response, no overt s/s of aspiration.  No solids eaten from lunch tray.  Sleepy, but phonation clear post-swallow; f/c.  SLP services will follow.     HPI HPI: 60 y.o.malewith past medical history of COPD, stage IV NSCLC on active chemo with docetaxel and Cyramza, DMII, CAD, HTN, H LD, OSA and PVDadmitted on 12/18/2017with progressive shortness of breath that awakened him from sleep and was quite severe. He has been more short of breath than usual for a few days. This was accompanied by congestion. His wife reports he felt like he had phlegm, but couldn't get it up.[name] He went to bed doing okay (he always has to sleep sitting up), but then awoke around 3am and needed to go to the hospital. EMS was called and found him minimally responsive. CXR with perhaps some interval enlargement in chronic right sided findings as well as interstitial prominence concerning for possible infection/postobstructive pneumonia vs acute edema. Pt intubated from 12/18 to 12/21. Pt and wife report some dry mouth/mucositis prior to admission with some esophageal dysphagia with solids.       SLP Plan  Continue with current plan of care     Recommendations  Diet recommendations: Regular;Thin liquid Liquids provided via: Cup;Straw Medication Administration: Whole meds with puree Supervision: Patient able to self feed;Staff to assist with self feeding Compensations: Slow rate;Small sips/bites Postural Changes and/or Swallow Maneuvers:  Seated upright 90 degrees                Oral Care Recommendations: Oral care BID Follow up Recommendations: 24 hour supervision/assistance Plan: Continue with current plan of care       GO                Ernest [name] 11/16/2016, 1:09 PM

## 2016-11-16 NOTE — Progress Notes (Signed)
Daily Progress Note   Patient Name: Ernest [name]       Date: 11/16/2016 DOB: 27-Sep-1956  Age: 60 y.o. MRN#: [id number] Attending Physician: Allie Bossier, MD Primary Care Physician: [name], MD Admit Date: 11/11/2016  Reason for Consultation/Follow-up: Establishing goals of care, Non pain symptom management and Pain control  Subjective: Sitting in bed at time of examination today.  He reports pain and anxiety are under control but he has been very nauseous.  Nothing has really helped.   Length of Stay: 5  Current Medications: Scheduled Meds:  . acetylcysteine  4 mL Nebulization BID  . clonazePAM  0.5 mg Oral QHS  . dextromethorphan-guaiFENesin  1 tablet Oral BID  . enoxaparin (LOVENOX) injection  40 mg Subcutaneous Q24H  . famotidine  20 mg Oral BID  . gi cocktail  30 mL Oral Once  . insulin aspart  0-20 Units Subcutaneous Q4H  . ipratropium-albuterol  3 mL Nebulization Q4H  . mouth rinse  15 mL Mouth Rinse BID  . methylPREDNISolone (SOLU-MEDROL) injection  60 mg Intravenous Q24H  . piperacillin-tazobactam (ZOSYN)  IV  3.375 g Intravenous Q8H  . sennosides  5 mL Per Tube BID  . vancomycin  1,000 mg Intravenous Q8H    Continuous Infusions:   PRN Meds: sodium chloride, albuterol, ALPRAZolam, metoCLOPramide (REGLAN) injection, morphine, ondansetron  Physical Exam   General: Alert, awake, in no acute distress.  HEENT: No bruits, no goiter, no JVD Heart: Regular rate and rhythm. No murmur appreciated. Lungs: Fair air movement, diminished at bases Abdomen: Soft, nontender, nondistended, positive bowel sounds.  Ext: No significant edema Skin: Warm and dry Neuro: Grossly intact, nonfocal.         Vital Signs: BP (!) 151/83 (BP Location: Left Arm)   Pulse 96    Temp 97.5 F (36.4 C) (Oral)   Resp (!) 22   Ht '5\' 6"'$  (1.676 m)   Wt 76.4 kg (168 lb 6.9 oz)   SpO2 99%   BMI 27.19 kg/m  SpO2: SpO2: 99 % O2 Device: O2 Device: Nasal Cannula O2 Flow Rate: O2 Flow Rate (L/min): 3 L/min  Intake/output summary:   Intake/Output Summary (Last 24 hours) at 11/16/16 2313 Last data filed at 11/16/16 1500  Gross per 24 hour  Intake  960 ml  Output             1065 ml  Net             -105 ml   LBM: Last BM Date: 11/15/16 Baseline Weight: Weight: 77.5 kg (170 lb 13.7 oz) Most recent weight: Weight: 76.4 kg (168 lb 6.9 oz)       Palliative Assessment/Data:      Patient Active Problem List   Diagnosis Date Noted  . Postobstructive pneumonia   . Non-small cell cancer of right lung ([place])   . OSA (obstructive sleep apnea)   . CAD in native artery   . Type 2 diabetes mellitus with complication ([place])   . History of ETT   . Goals of care, counseling/discussion   . Acute respiratory failure with hypoxia and hypercapnia ([place]) 11/11/2016  . Acute bronchitis 11/05/2016  . Tinea corporis 10/31/2016  . Hyponatremia 10/22/2016  . Anxiety 10/22/2016  . Neoplasm related pain 08/27/2016  . Acute seasonal allergic rhinitis due to pollen 08/27/2016  . Insomnia due to anxiety and fear 08/01/2016  . Therapeutic opioid-induced constipation ([facility]) 07/18/2016  . Shortness of breath 06/05/2016  . Encounter for antineoplastic chemotherapy 02/20/2016  . 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 COPD exacerbation ([place]) 11/24/2015  . Rhinitis, nonallergic, chronic 09/22/2015  . 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 Claudication ([place]) 08/10/2015  . Coronary artery disease involving native coronary artery of native heart without angina pectoris 05/17/2015  . Diabetes mellitus without complication ([place]) 56/31/4970  . Palliative care encounter   . Chronic respiratory failure with hypercapnia ([place])   . Chronic pain syndrome   . Chronic respiratory  failure with hypoxia ([place]) 12/15/2014  . Non-small cell carcinoma of lung, right ([place]) 10/07/2013  . Obstructive sleep apnea 09/24/2010  . Peripheral vascular disease ([place]) 05/15/2010  . Hyperlipidemia with target LDL less than 70 05/16/2009  . GOUT 09/08/2007  . Depression with anxiety 09/08/2007  . Essential hypertension 09/08/2007  . COPD mixed type ([place]) 09/08/2007  . GERD 09/08/2007    Palliative Care Assessment & Plan   Patient Profile: 60 y.o.malewith past medical history of COPD, stage IV NSCLC on active chemo with docetaxel and Cyramza, DMII, CAD, HTN, H LD, OSA and PVDadmitted on 12/18/2017with progressive shortness of breath.   Recommendations/Plan: - Goal is to continue current care with hopes of improving and being able to continue with current treatment for NSCLC.  - He wants to be reintubated if necessary to make it to Christmas.  We discussed that there is a high possibility that he may not be able to be extubated again. He would want to have one way extubation with focus on comfort sometime after Christmas if reintubated and it does not appear that he will be able to come of vent successfully.  He also desires pressors/ACLS medications if needed through Christmas as well.  He does not want chest compressions (CPR) in the event of cardiac arrest.  He does not want defibrillation.  He would never want trach or PEG. - Continue to titrate medications for symptoms.  - Pian: Reports that his pain has been doing ok today.  He has not used any of his PRN medication today. Continue same and titrate as needed based on usage and symptoms. - Anxiety: He has not used any of his PRN medication today. Continue same and titrate as needed based on usage and symptoms. - Nausea: Reports this is his main problem today.  Restart home  zofran '8mg'$  every 8 hours as needed.  Also added reglan as needed if zofran is ineffective.  Goals of Care and Additional Recommendations:  Limitations on  Scope of Treatment: No Artificial Feeding and No Tracheostomy  Code Status:    Code Status Orders        Start     Ordered   11/14/16 1427  Limited resuscitation (code)  Continuous    Question Answer Comment  In the event of cardiac or respiratory ARREST: Initiate Code Blue, Call Rapid Response Yes   In the event of cardiac or respiratory ARREST: Perform CPR No   In the event of cardiac or respiratory ARREST: Perform Intubation/Mechanical Ventilation Yes   In the event of cardiac or respiratory ARREST: Use NIPPV/BiPAp only if indicated Yes   In the event of cardiac or respiratory ARREST: Administer ACLS medications if indicated Yes   In the event of cardiac or respiratory ARREST: Perform Defibrillation or Cardioversion if indicated No      11/14/16 1426    Code Status History    Date Active Date Inactive Code Status Order ID Comments User Context   11/13/2016 10:56 AM 11/14/2016  2:26 PM DNR [id number]  Rush Farmer, MD Inpatient   11/11/2016  5:13 AM 11/13/2016 10:56 AM Full Code [id number]  Dannielle Burn, MD ED   05/18/2016  2:00 PM 05/23/2016  4:22 PM Full Code [id number]  Waldemar Dickens, MD ED   04/21/2016  6:29 PM 04/25/2016  8:37 PM Full Code [id number]  Lily Kocher, MD Inpatient   01/18/2016  4:43 AM 01/21/2016  5:44 PM Full Code [id number]  Etta Quill, DO ED   11/24/2015  4:44 AM 11/30/2015  4:42 PM Full Code [id number]  Etta Quill, DO ED   08/10/2015  4:42 PM 08/11/2015  5:54 PM Full Code [id number]  Lorretta Harp, MD Inpatient   04/11/2015  3:17 AM 04/15/2015  2:32 PM Full Code [id number]  Rahul Dianna Rossetti, PA-C ED   02/15/2014  4:02 AM 02/17/2014 10:40 PM Full Code [id number]  Berle Mull, MD ED    Advance Directive Documentation   Flowsheet Row Most Recent Value  Type of Advance Directive  Healthcare Power of Attorney  Pre-existing out of facility DNR order (yellow form or pink MOST form)  No data  "MOST" Form in Place?  No data       Prognosis:   Unable to  determine  Discharge Planning:  Home with Home Health most likely  Care plan was discussed with patient, RN  Thank you for allowing the Palliative Medicine Team to assist in the care of this patient.   Time In: 1830 Time Out: 1850 Total Time 20 Prolonged Time Billed No      Greater than 50%  of this time was spent counseling and coordinating care related to the above assessment and plan.  Micheline Rough, MD  Please contact Palliative Medicine Team phone at [phone] for questions and concerns.

## 2016-11-16 NOTE — Progress Notes (Signed)
Pt refusing CPAP at this time.

## 2016-11-16 NOTE — Progress Notes (Addendum)
PROGRESS NOTE    Ernest [name]  RWE:[id number] DOB: Mar 17, 1956 DOA: 11/11/2016 PCP: [name], MD   Brief Narrative:  [phone]  PMHx Anxiety Depression; Chronic respiratory failure with hypoxia (on 3 L O2 @ hm), COPD, stage IV NSCLC on active chemo with docetaxel and Cyramza S/P XRT, DM Type II, CAD, HTN, H LD, OSA and PVD, Chronic Pain syndrome (spinal fusion)  Who presents with acute onset shortness of breath that awakened him from sleep and was quite severe. He has been more short of breath than usual for a few days. This was accompanied by congestion. No fever / chills / diarrhea / nausea / vomiting. His wife reports he felt like he had phlegm, but couldn't get it up. No hemoptysis. He was treating his congestion with OTC mucinex and had increased his rescue albuterol use. He went to bed doing okay (he always has to sleep sitting up), but then awoke around 3am and needed to go to the hospital. EMS was called and found him minimally responsive. His respirations were assisted with BVM en route. Once in the ED, he was intubated.   Labs showed elevated WBC (which seems chronic for him and may be chemo related), normal renal function, normal BNP, normal troponin, normal lactate. ABG was consistent with a respiratory acidosis. CXR with perhaps some interval enlargement in chronic right sided findings as well as interstitial prominence concerning for possible infection/postobstructive pneumonia vs acute edema.  Since being intubated, his mental status has improved. He is arousable and briskly follows commands. He denies pain.    Subjective: 12/23  A/O 4, negative CP, positive SOB, positive DOE.   Assessment & Plan:   Active Problems:   Acute respiratory failure with hypoxia and hypercapnia (HCC)   Goals of care, counseling/discussion   History of ETT   Acute hypoxemic/hypercapneic respiratory failure/COPD/asthma exacerbation/postobstructive pneumonia -Complete course of  antibiotics -3 L [place] at home is baseline -Extubated 12/21 -Requiring 4L [place] when ambulating -DuoNebs q 4h scheduled -Albuterol q2h prn -Solu-Medrol Changed prednisone to IV solumedrol, continue for now -Flutter valve -Mucinex DM -Mucomyst BID  Stage IV NSCLC -Spoke with Dr. Alen Blew oncology who stated Dr. Curt Bears will see patient on 12/25  OSA. -CPAP per respiratory  CAD native artery   DM type II   controlled with complication  -   resistant SSI  Refractory Nausea -Thorazine 12.5 mg IV PRN refractory nausea not respond to Phenergan   DVT prophylaxis: Lovenox Code Status: Partial Family Communication: None Disposition Plan: ?   Consultants:  Oncology pending  Procedures/Significant Events:  NA   VENTILATOR SETTINGS: NA   Cultures Blood cx x 2  12/18 >>NTD UA 12/18>>>NTD Resp quant 12/18>>>NTD RVP>>>NTD    Antimicrobials: Anti-infectives    Start     Stop   11/13/16 2200  vancomycin (VANCOCIN) IVPB 1000 mg/200 mL premix     11/18/16 1359   11/11/16 1400  vancomycin (VANCOCIN) IVPB 750 mg/150 ml premix  Status:  Discontinued     11/13/16 1608   11/11/16 1400  piperacillin-tazobactam (ZOSYN) IVPB 3.375 g     11/18/16 1359   11/11/16 0445  vancomycin (VANCOCIN) IVPB 1000 mg/200 mL premix     11/11/16 0630   11/11/16 0445  piperacillin-tazobactam (ZOSYN) IVPB 3.375 g     11/11/16 0600       Devices    LINES / TUBES:  OETT 12/18>>>12/21 OGT 12/18>>>12/21 PIV    Continuous Infusions:   Objective: Vitals:   11/16/16 0455  11/16/16 0500 11/16/16 0600 11/16/16 0817  BP:  136/76    Pulse:  82 81   Resp:  (!) 32 (!) 23   Temp: 98.3 F (36.8 C)   97.5 F (36.4 C)  TempSrc: Oral   Oral  SpO2:  95% 97%   Weight:      Height:        Intake/Output Summary (Last 24 hours) at 11/16/16 0819 Last data filed at 11/16/16 0600  Gross per 24 hour  Intake             1290 ml  Output              890 ml  Net              400 ml    Filed Weights   11/13/16 0349 11/14/16 0500 11/15/16 0500  Weight: 75.4 kg (166 lb 3.6 oz) 75 kg (165 lb 5.5 oz) 76.4 kg (168 lb 6.9 oz)    Examination:  General: A/O 4, positive acute on chronic acute respiratory distress Eyes: negative scleral hemorrhage, negative anisocoria, negative icterus ENT: Negative Runny nose, negative gingival bleeding, Neck:  Negative scars, masses, torticollis, lymphadenopathy, JVD Lungs: positive diffuse rhonchi Rt>>Lt. positive inspiratory and expiratory wheezing Rt>>Lt  Cardiovascular: Regular rate and rhythm without murmur gallop or rub normal S1 and S2 Abdomen: negative abdominal pain, nondistended, positive soft, bowel sounds, no rebound, no ascites, no appreciable mass Extremities: No significant cyanosis, clubbing, or edema bilateral lower extremities Skin: Negative rashes, lesions, ulcers Psychiatric:  Negative depression, negative anxiety, negative fatigue, negative mania  Central nervous system:  Cranial nerves II through XII intact, tongue/uvula midline, all extremities muscle strength 5/5, sensation intact throughout, negative dysarthria, negative expressive aphasia, negative receptive aphasia.  .     Data Reviewed: Care during the described time interval was provided by me .  I have reviewed this patient's available data, including medical history, events of note, physical examination, and all test results as part of my evaluation. I have personally reviewed and interpreted all radiology studies.  CBC:  Recent Labs Lab 11/11/16 0340  11/12/16 0231 11/13/16 0328 11/14/16 0631 11/15/16 0534 11/16/16 0229  WBC 29.5*  --  16.3* 15.2* 11.4* 15.5* 14.4*  NEUTROABS 23.3*  --   --   --   --   --   --   HGB 13.2  < > 12.2* 12.8* 12.5* 12.1* 11.7*  HCT 42.1  < > 39.0 41.2 39.3 38.4* 37.0*  MCV 92.3  --  90.5 91.6 89.5 89.9 90.5  PLT 215  --  141* 118* 88* 104* 114*  < > = values in this interval not displayed. Basic Metabolic  Panel:  Recent Labs Lab 11/12/16 0231 11/13/16 0328 11/14/16 0631 11/15/16 0534 11/16/16 0229  NA 134* 133* 135 135 134*  K 4.5 4.1 3.4* 3.3* 3.6  CL 97* 93* 94* 96* 94*  CO2 27 30 33* 29 31  GLUCOSE 148* 97 133* 94 97  BUN 21* 22* 31* 28* 19  CREATININE 0.83 0.75 0.81 0.75 0.63  CALCIUM 8.8* 9.0 9.2 9.1 8.7*  MG 2.2 2.0 2.2 2.0 2.1  PHOS 2.4* 4.4 4.2 2.5 2.2*   GFR: Estimated Creatinine Clearance: 87.5 mL/min (by C-G formula based on SCr of 0.63 mg/dL). Liver Function Tests: No results for input(s): AST, ALT, ALKPHOS, BILITOT, PROT, ALBUMIN in the last 168 hours. No results for input(s): LIPASE, AMYLASE in the last 168 hours. No results for input(s): AMMONIA in the  last 168 hours. Coagulation Profile: No results for input(s): INR, PROTIME in the last 168 hours. Cardiac Enzymes: No results for input(s): CKTOTAL, CKMB, CKMBINDEX, TROPONINI in the last 168 hours. BNP (last 3 results) No results for input(s): PROBNP in the last 8760 hours. HbA1C: No results for input(s): HGBA1C in the last 72 hours. CBG:  Recent Labs Lab 11/15/16 1221 11/15/16 1614 11/15/16 2001 11/15/16 2346 11/16/16 0453  GLUCAP 99 109* 109* 137* 98   Lipid Profile: No results for input(s): CHOL, HDL, LDLCALC, TRIG, CHOLHDL, LDLDIRECT in the last 72 hours. Thyroid Function Tests: No results for input(s): TSH, T4TOTAL, FREET4, T3FREE, THYROIDAB in the last 72 hours. Anemia Panel: No results for input(s): VITAMINB12, FOLATE, FERRITIN, TIBC, IRON, RETICCTPCT in the last 72 hours. Urine analysis:    Component Value Date/Time   COLORURINE AMBER (A) 05/18/2016 1310   APPEARANCEUR CLEAR 05/18/2016 1310   LABSPEC 1.019 05/18/2016 1310   PHURINE 7.0 05/18/2016 1310   GLUCOSEU NEGATIVE 05/18/2016 1310   GLUCOSEU NEGATIVE 10/05/2015 0958   HGBUR NEGATIVE 05/18/2016 1310   BILIRUBINUR SMALL (A) 05/18/2016 1310   BILIRUBINUR n 08/14/2011 1449   KETONESUR 15 (A) 05/18/2016 1310   PROTEINUR Negative  10/22/2016 1137   UROBILINOGEN 0.2 10/05/2015 0958   NITRITE NEGATIVE 05/18/2016 1310   LEUKOCYTESUR NEGATIVE 05/18/2016 1310   Sepsis Labs: '@LABRCNTIP'$ (procalcitonin:4,lacticidven:4)  ) Recent Results (from the past 240 hour(s))  Culture, blood (Routine X 2) w Reflex to ID Panel     Status: None (Preliminary result)   Collection Time: 11/11/16  4:52 AM  Result Value Ref Range Status   Specimen Description BLOOD RIGHT ANTECUBITAL  Final   Special Requests BOTTLES DRAWN AEROBIC AND ANAEROBIC 5CC EA  Final   Culture NO GROWTH 4 DAYS  Final   Report Status PENDING  Incomplete  Culture, blood (Routine X 2) w Reflex to ID Panel     Status: None (Preliminary result)   Collection Time: 11/11/16  5:30 AM  Result Value Ref Range Status   Specimen Description BLOOD RIGHT HAND  Final   Special Requests IN PEDIATRIC BOTTLE 2CC  Final   Culture NO GROWTH 4 DAYS  Final   Report Status PENDING  Incomplete  Respiratory Panel by PCR     Status: None   Collection Time: 11/11/16  5:34 AM  Result Value Ref Range Status   Adenovirus NOT DETECTED NOT DETECTED Final   Coronavirus 229E NOT DETECTED NOT DETECTED Final   Coronavirus HKU1 NOT DETECTED NOT DETECTED Final   Coronavirus NL63 NOT DETECTED NOT DETECTED Final   Coronavirus OC43 NOT DETECTED NOT DETECTED Final   Metapneumovirus NOT DETECTED NOT DETECTED Final   Rhinovirus / Enterovirus NOT DETECTED NOT DETECTED Final   Influenza A NOT DETECTED NOT DETECTED Final   Influenza B NOT DETECTED NOT DETECTED Final   Parainfluenza Virus 1 NOT DETECTED NOT DETECTED Final   Parainfluenza Virus 2 NOT DETECTED NOT DETECTED Final   Parainfluenza Virus 3 NOT DETECTED NOT DETECTED Final   Parainfluenza Virus 4 NOT DETECTED NOT DETECTED Final   Respiratory Syncytial Virus NOT DETECTED NOT DETECTED Final   Bordetella pertussis NOT DETECTED NOT DETECTED Final   Chlamydophila pneumoniae NOT DETECTED NOT DETECTED Final   Mycoplasma pneumoniae NOT DETECTED NOT  DETECTED Final  MRSA PCR Screening     Status: None   Collection Time: 11/11/16  6:42 AM  Result Value Ref Range Status   MRSA by PCR NEGATIVE NEGATIVE Final    Comment:  The GeneXpert MRSA Assay (FDA approved for NASAL specimens only), is one component of a comprehensive MRSA colonization surveillance program. It is not intended to diagnose MRSA infection nor to guide or monitor treatment for MRSA infections.   Culture, respiratory (NON-Expectorated)     Status: None   Collection Time: 11/11/16  8:00 AM  Result Value Ref Range Status   Specimen Description TRACHEAL ASPIRATE  Final   Special Requests Immunocompromised  Final   Gram Stain   Final    ABUNDANT WBC PRESENT,BOTH PMN AND MONONUCLEAR RARE SQUAMOUS EPITHELIAL CELLS PRESENT FEW GRAM POSITIVE COCCI IN PAIRS RARE GRAM NEGATIVE COCCI IN PAIRS RARE GRAM POSITIVE RODS    Culture Consistent with normal respiratory flora.  Final   Report Status 11/13/2016 FINAL  Final         Radiology Studies: No results found.      Scheduled Meds: . clonazePAM  0.5 mg Oral QHS  . enoxaparin (LOVENOX) injection  40 mg Subcutaneous Q24H  . famotidine  20 mg Oral BID  . gi cocktail  30 mL Oral Once  . guaiFENesin  600 mg Oral BID  . insulin aspart  0-20 Units Subcutaneous Q4H  . ipratropium-albuterol  3 mL Nebulization Q4H  . mouth rinse  15 mL Mouth Rinse BID  . piperacillin-tazobactam (ZOSYN)  IV  3.375 g Intravenous Q8H  . potassium phosphate IVPB (mmol)  30 mmol Intravenous Once  . sennosides  5 mL Per Tube BID  . vancomycin  1,000 mg Intravenous Q8H   Continuous Infusions:   LOS: 5 days    Time spent: 40 minutes    [place], Geraldo Docker, MD [facility] Pager [phone]   If 7PM-7AM, please contact night-coverage [email or web address] Password [facility] 11/16/2016, 8:19 AM

## 2016-11-16 NOTE — Progress Notes (Signed)
eLink Physician-Brief Progress Note Patient Name: Ernest [name] DOB: January 12, 1956 MRN: [id number]   Date of Service  11/16/2016  HPI/Events of Note  Hypokalemia and hypophos  eICU Interventions  Potassium and phos replaced     Intervention Category Intermediate Interventions: Electrolyte abnormality - evaluation and management  DETERDING,ELIZABETH 11/16/2016, 4:20 AM

## 2016-11-16 NOTE — Progress Notes (Signed)
   11/16/16 1750  Clinical Encounter Type  Visited With Patient and family together  Visit Type Spiritual support  Spiritual Encounters  Spiritual Needs Prayer  Stress Factors  Patient Stress Factors Health changes  Family Stress Factors Family relationships  Introduction to Pt and family. Offered prayer of comfort and support.

## 2016-11-17 DIAGNOSIS — I2510 Atherosclerotic heart disease of native coronary artery without angina pectoris: Secondary | ICD-10-CM

## 2016-11-17 DIAGNOSIS — Z9289 Personal history of other medical treatment: Secondary | ICD-10-CM

## 2016-11-17 DIAGNOSIS — E118 Type 2 diabetes mellitus with unspecified complications: Secondary | ICD-10-CM

## 2016-11-17 LAB — BASIC METABOLIC PANEL
Anion gap: 7 (ref 5–15)
BUN: 16 mg/dL (ref 6–20)
CHLORIDE: 95 mmol/L — AB (ref 101–111)
CO2: 28 mmol/L (ref 22–32)
Calcium: 8.7 mg/dL — ABNORMAL LOW (ref 8.9–10.3)
Creatinine, Ser: 0.78 mg/dL (ref 0.61–1.24)
GFR calc Af Amer: >60 mL/min (ref >60)
GFR calc non Af Amer: >60 mL/min (ref >60)
Glucose, Bld: 121 mg/dL — ABNORMAL HIGH (ref 65–99)
Potassium: 5.1 mmol/L (ref 3.5–5.1)
SODIUM: 130 mmol/L — AB (ref 135–145)

## 2016-11-17 LAB — CBC WITH DIFFERENTIAL/PLATELET
BASOS ABS: 0 10*3/uL (ref 0.0–0.1)
Basophils Relative: 0 %
Eosinophils Absolute: 0 10*3/uL (ref 0.0–0.7)
Eosinophils Relative: 0 %
HCT: 38.9 % — ABNORMAL LOW (ref 39.0–52.0)
HEMOGLOBIN: 12.1 g/dL — AB (ref 13.0–17.0)
Lymphocytes Relative: 3 %
Lymphs Abs: 0.3 10*3/uL — ABNORMAL LOW (ref 0.7–4.0)
MCH: 28.7 pg (ref 26.0–34.0)
MCHC: 31.1 g/dL (ref 30.0–36.0)
MCV: 92.2 fL (ref 78.0–100.0)
MONO ABS: 0.1 10*3/uL (ref 0.1–1.0)
Monocytes Relative: 1 %
NEUTROS ABS: 11.4 10*3/uL — AB (ref 1.7–7.7)
Neutrophils Relative %: 96 %
Platelets: 121 10*3/uL — ABNORMAL LOW (ref 150–400)
RBC: 4.22 MIL/uL (ref 4.22–5.81)
RDW: 17.5 % — ABNORMAL HIGH (ref 11.5–15.5)
WBC: 11.8 10*3/uL — ABNORMAL HIGH (ref 4.0–10.5)

## 2016-11-17 LAB — GLUCOSE, CAPILLARY
GLUCOSE-CAPILLARY: 141 mg/dL — AB (ref 65–99)
GLUCOSE-CAPILLARY: 116 mg/dL — AB (ref 65–99)
Glucose-Capillary: 118 mg/dL — ABNORMAL HIGH (ref 65–99)
Glucose-Capillary: 134 mg/dL — ABNORMAL HIGH (ref 65–99)
Glucose-Capillary: 154 mg/dL — ABNORMAL HIGH (ref 65–99)
Glucose-Capillary: 95 mg/dL (ref 65–99)

## 2016-11-17 LAB — MAGNESIUM: MAGNESIUM: 2.3 mg/dL (ref 1.7–2.4)

## 2016-11-17 MED ORDER — SODIUM CHLORIDE 0.9 % IV SOLN
12.5000 mg | Freq: Four times a day (QID) | INTRAVENOUS | Status: DC | PRN
  Administered 2016-11-17 – 2016-11-18 (×4): 12.5 mg via INTRAVENOUS
  Filled 2016-11-17 (×7): qty 0.5

## 2016-11-17 MED ORDER — GUAIFENESIN ER 600 MG PO TB12
1200.0000 mg | ORAL_TABLET | Freq: Two times a day (BID) | ORAL | Status: DC
  Administered 2016-11-17 – 2016-11-19 (×5): 1200 mg via ORAL
  Filled 2016-11-17 (×5): qty 2

## 2016-11-17 MED ORDER — PROMETHAZINE HCL 25 MG/ML IJ SOLN
12.5000 mg | Freq: Four times a day (QID) | INTRAMUSCULAR | Status: DC | PRN
Start: 2016-11-17 — End: 2016-11-19
  Administered 2016-11-17 – 2016-11-18 (×3): 12.5 mg via INTRAVENOUS
  Filled 2016-11-17 (×3): qty 1

## 2016-11-17 MED ORDER — FUROSEMIDE 10 MG/ML IJ SOLN
40.0000 mg | Freq: Once | INTRAMUSCULAR | Status: AC
  Administered 2016-11-17: 40 mg via INTRAVENOUS
  Filled 2016-11-17: qty 4

## 2016-11-17 MED ORDER — PROMETHAZINE HCL 25 MG PO TABS
12.5000 mg | ORAL_TABLET | Freq: Four times a day (QID) | ORAL | Status: DC | PRN
  Administered 2016-11-17 – 2016-11-19 (×2): 12.5 mg via ORAL
  Filled 2016-11-17 (×2): qty 1

## 2016-11-17 NOTE — Progress Notes (Signed)
Pt was complaining of nausea pt said zofran not working for his nausea, on call MD Hamad was paged gave verbal order for phenergan iv 12.5 Q6, order carried out will continue to monitor

## 2016-11-17 NOTE — Progress Notes (Signed)
Physical Therapy Treatment Patient Details Name: Ernest [name] MRN: [id number] DOB: January 25, 1956 Today's Date: 11/17/2016    History of Present Illness 60 y.o. 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      PT Comments    Pt and spouse reporting that they are hoping to be able to go home following acute stay. During session, pt able to ambulate 120 ft with rw using 3L O2 (pt requests staying at 3L), SpO2 90% upon return to sitting. Recommending HHPT services following acute stay. PT to continue to follow and progress mobility as tolerated. Pt and spouse report feeling confident with pt going home regarding ambulation.   Follow Up Recommendations  Home health PT;Supervision for mobility/OOB     Equipment Recommendations  Rolling walker with 5" wheels;None recommended by PT    Recommendations for Other Services       Precautions / Restrictions Precautions Precautions: Fall Precaution Comments: watch o2 sats Restrictions Weight Bearing Restrictions: No    Mobility  Bed Mobility Overal bed mobility: Needs Assistance Bed Mobility: Supine to Sit     Supine to sit: Supervision     General bed mobility comments:  (HOB elevated, using rail to assist)  Transfers Overall transfer level: Needs assistance Equipment used: Rolling walker (2 wheeled) Transfers: Sit to/from Stand Sit to Stand: Min guard         General transfer comment: Min guard for safety, no physical assist required.   Ambulation/Gait Ambulation/Gait assistance: Min guard Ambulation Distance (Feet): 120 Feet Assistive device: Rolling walker (2 wheeled) Gait Pattern/deviations: Step-through pattern;Decreased step length - right;Decreased step length - left;Trunk flexed Gait velocity: decreased   General Gait Details: mild instability but no loss of  balance. Assist of second person with lines   Stairs            Wheelchair Mobility    Modified Rankin (Stroke Patients Only)       Balance Overall balance assessment: Needs assistance Sitting-balance support: No upper extremity supported Sitting balance-Leahy Scale: Good     Standing balance support: Bilateral upper extremity supported Standing balance-Leahy Scale: Poor Standing balance comment: using rw                    Cognition Arousal/Alertness: Awake/alert Behavior During Therapy: WFL for tasks assessed/performed Overall Cognitive Status: Within Functional Limits for tasks assessed                      Exercises      General Comments General comments (skin integrity, edema, etc.): SpO2 96% prior to ambulation and 90% upon return (taken once sitting)      Pertinent Vitals/Pain Pain Assessment: No/denies pain    Home Living                      Prior Function            PT Goals (current goals can now be found in the care plan section) Acute Rehab PT Goals Patient Stated Goal: to go home PT Goal Formulation: With patient Time For Goal Achievement: 11/29/16 Potential to Achieve Goals: Good Progress towards PT goals: Progressing toward goals    Frequency    Min 3X/week      PT Plan Discharge plan needs to be updated    Co-evaluation  End of Session Equipment Utilized During Treatment: Gait belt;Oxygen (3L) Activity Tolerance: Patient limited by fatigue Patient left: in chair;with call bell/phone within reach;with chair alarm set;with family/visitor present     Time: 3300-7622 PT Time Calculation (min) (ACUTE ONLY): 20 min  Charges:  $Gait Training: 8-22 mins                    G Codes:      Cassell Clement, PT, CSCS Pager [phone] Office [phone]  11/17/2016, 11:42 AM

## 2016-11-17 NOTE — Progress Notes (Signed)
Pt refuses cpap at this time

## 2016-11-17 NOTE — Progress Notes (Signed)
PROGRESS NOTE    Ernest [name]  ZOX:[id number] DOB: Nov 04, 1956 DOA: 11/11/2016 PCP: [name], MD   Subjective: He continued to have some shortness of breath, cough with minimal sputum production Continue to use flutter valve, will give 1 dose of Lasix today.  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 He has been more short of breath than usual for a few days. This was accompanied by congestion. No fever / chills / diarrhea / nausea / vomiting. His wife reports he felt like he had phlegm, but couldn't get it up. No hemoptysis. He was treating his congestion with OTC mucinex and had increased his rescue albuterol use. He went to bed doing okay (he always has to sleep sitting up), but then awoke around 3am and needed to go to the hospital. EMS was called and found him minimally responsive. His respirations were assisted with BVM en route. Once in the ED, he was intubated.   Labs showed elevated WBC (which seems chronic for him and may be chemo related), normal renal function, normal BNP, normal troponin, normal lactate. ABG was consistent with a respiratory acidosis. CXR with perhaps some interval enlargement in chronic right sided findings as well as interstitial prominence concerning for possible infection/postobstructive pneumonia vs acute edema.  Since being intubated, his mental status has improved. He is arousable and briskly follows commands. He denies pain.    Assessment & Plan:   Active Problems:   Acute respiratory failure with hypoxia and hypercapnia (HCC)   Goals of care, counseling/discussion   History of ETT   Postobstructive pneumonia   Non-small  cell cancer of right lung (HCC)   OSA (obstructive sleep apnea)   CAD in native artery   Type 2 diabetes mellitus with complication (HCC)   Acute hypoxemic/hypercapneic respiratory failure/COPD/asthma exacerbation/postobstructive pneumonia -Complete course of antibiotics -3 L [place] at home is baseline -Extubated 12/21 -Requiring 4L [place] when ambulating -DuoNebs q 4h scheduled -Albuterol q2h prn -Continue Steroids -Flutter valve -Mucinex DM  Stage IV NSCLC -Spoke with Dr. Alen Blew oncology who stated Dr. Curt Bears will see patient on 12/25  OSA. -CPAP per respiratory  CAD native artery   DM type II   controlled with complication  -   resistant SSI  Refractory Nausea -Thorazine 12.5 mg IV PRN refractory nausea not respond to Phenergan   DVT prophylaxis: Lovenox Code Status: Partial Family Communication: None Disposition Plan: ?   Consultants:  Oncology pending  Procedures/Significant Events:  NA   VENTILATOR SETTINGS: NA   Cultures Blood cx x 2  12/18 >>NTD UA 12/18>>>NTD Resp quant 12/18>>>NTD RVP>>>NTD    Antimicrobials: Anti-infectives    Start     Stop   11/13/16 2200  vancomycin (VANCOCIN) IVPB 1000 mg/200 mL premix     11/18/16 1359   11/11/16 1400  vancomycin (VANCOCIN) IVPB 750 mg/150 ml premix  Status:  Discontinued     11/13/16 1608   11/11/16 1400  piperacillin-tazobactam (ZOSYN) IVPB 3.375 g     11/18/16 1359   11/11/16 0445  vancomycin (VANCOCIN) IVPB 1000 mg/200 mL premix     11/11/16 0630   11/11/16 0445  piperacillin-tazobactam (ZOSYN) IVPB 3.375  g     11/11/16 0600       Devices    LINES / TUBES:  OETT 12/18>>>12/21 OGT 12/18>>>12/21 PIV    Continuous Infusions:   Objective: Vitals:   11/16/16 1831 11/16/16 2055 11/17/16 0457 11/17/16 0836  BP: 113/61 (!) 151/83 127/66   Pulse: 95 96 88   Resp: (!) 24 (!) 22 18   Temp: 97.8 F (36.6 C) 97.5 F (36.4 C) 97.5 F (36.4 C)   TempSrc: Oral Oral Oral   SpO2:  100% 99% 100% 98%  Weight:      Height:        Intake/Output Summary (Last 24 hours) at 11/17/16 1107 Last data filed at 11/17/16 0546  Gross per 24 hour  Intake             1150 ml  Output             1270 ml  Net             -120 ml   Filed Weights   11/13/16 0349 11/14/16 0500 11/15/16 0500  Weight: 75.4 kg (166 lb 3.6 oz) 75 kg (165 lb 5.5 oz) 76.4 kg (168 lb 6.9 oz)    Examination:  General: A/O 4, positive acute on chronic acute respiratory distress Eyes: negative scleral hemorrhage, negative anisocoria, negative icterus ENT: Negative Runny nose, negative gingival bleeding, Neck:  Negative scars, masses, torticollis, lymphadenopathy, JVD Lungs: positive diffuse rhonchi Rt>>Lt. positive inspiratory and expiratory wheezing Rt>>Lt  Cardiovascular: Regular rate and rhythm without murmur gallop or rub normal S1 and S2 Abdomen: negative abdominal pain, nondistended, positive soft, bowel sounds, no rebound, no ascites, no appreciable mass Extremities: No significant cyanosis, clubbing, or edema bilateral lower extremities Skin: Negative rashes, lesions, ulcers Psychiatric:  Negative depression, negative anxiety, negative fatigue, negative mania  Central nervous system:  Cranial nerves II through XII intact, tongue/uvula midline, all extremities muscle strength 5/5, sensation intact throughout, negative dysarthria, negative expressive aphasia, negative receptive aphasia.  .     Data Reviewed: Care during the described time interval was provided by me .  I have reviewed this patient's available data, including medical history, events of note, physical examination, and all test results as part of my evaluation. I have personally reviewed and interpreted all radiology studies.  CBC:  Recent Labs Lab 11/11/16 0340  11/13/16 0328 11/14/16 0631 11/15/16 0534 11/16/16 0229 11/17/16 0456  WBC 29.5*  < > 15.2* 11.4* 15.5* 14.4* 11.8*  NEUTROABS 23.3*  --   --   --   --   --   11.4*  HGB 13.2  < > 12.8* 12.5* 12.1* 11.7* 12.1*  HCT 42.1  < > 41.2 39.3 38.4* 37.0* 38.9*  MCV 92.3  < > 91.6 89.5 89.9 90.5 92.2  PLT 215  < > 118* 88* 104* 114* 121*  < > = values in this interval not displayed. Basic Metabolic Panel:  Recent Labs Lab 11/12/16 0231 11/13/16 0328 11/14/16 0631 11/15/16 0534 11/16/16 0229 11/17/16 0456  NA 134* 133* 135 135 134* 130*  K 4.5 4.1 3.4* 3.3* 3.6 5.1  CL 97* 93* 94* 96* 94* 95*  CO2 27 30 33* '29 31 28  '$ GLUCOSE 148* 97 133* 94 97 121*  BUN 21* 22* 31* 28* 19 16  CREATININE 0.83 0.75 0.81 0.75 0.63 0.78  CALCIUM 8.8* 9.0 9.2 9.1 8.7* 8.7*  MG 2.2 2.0 2.2 2.0 2.1 2.3  PHOS 2.4* 4.4 4.2 2.5 2.2*  --  GFR: Estimated Creatinine Clearance: 87.5 mL/min (by C-G formula based on SCr of 0.78 mg/dL). Liver Function Tests: No results for input(s): AST, ALT, ALKPHOS, BILITOT, PROT, ALBUMIN in the last 168 hours. No results for input(s): LIPASE, AMYLASE in the last 168 hours. No results for input(s): AMMONIA in the last 168 hours. Coagulation Profile: No results for input(s): INR, PROTIME in the last 168 hours. Cardiac Enzymes: No results for input(s): CKTOTAL, CKMB, CKMBINDEX, TROPONINI in the last 168 hours. BNP (last 3 results) No results for input(s): PROBNP in the last 8760 hours. HbA1C: No results for input(s): HGBA1C in the last 72 hours. CBG:  Recent Labs Lab 11/16/16 1559 11/16/16 2004 11/17/16 0013 11/17/16 0350 11/17/16 0800  GLUCAP 86 83 154* 118* 141*   Lipid Profile: No results for input(s): CHOL, HDL, LDLCALC, TRIG, CHOLHDL, LDLDIRECT in the last 72 hours. Thyroid Function Tests: No results for input(s): TSH, T4TOTAL, FREET4, T3FREE, THYROIDAB in the last 72 hours. Anemia Panel: No results for input(s): VITAMINB12, FOLATE, FERRITIN, TIBC, IRON, RETICCTPCT in the last 72 hours. Urine analysis:    Component Value Date/Time   COLORURINE AMBER (A) 05/18/2016 1310   APPEARANCEUR CLEAR 05/18/2016 1310    LABSPEC 1.019 05/18/2016 1310   PHURINE 7.0 05/18/2016 1310   GLUCOSEU NEGATIVE 05/18/2016 1310   GLUCOSEU NEGATIVE 10/05/2015 0958   HGBUR NEGATIVE 05/18/2016 1310   BILIRUBINUR SMALL (A) 05/18/2016 1310   BILIRUBINUR n 08/14/2011 1449   KETONESUR 15 (A) 05/18/2016 1310   PROTEINUR Negative 10/22/2016 1137   UROBILINOGEN 0.2 10/05/2015 0958   NITRITE NEGATIVE 05/18/2016 1310   LEUKOCYTESUR NEGATIVE 05/18/2016 1310   Sepsis Labs: '@LABRCNTIP'$ (procalcitonin:4,lacticidven:4)  ) Recent Results (from the past 240 hour(s))  Culture, blood (Routine X 2) w Reflex to ID Panel     Status: None   Collection Time: 11/11/16  4:52 AM  Result Value Ref Range Status   Specimen Description BLOOD RIGHT ANTECUBITAL  Final   Special Requests BOTTLES DRAWN AEROBIC AND ANAEROBIC 5CC EA  Final   Culture NO GROWTH 5 DAYS  Final   Report Status 11/16/2016 FINAL  Final  Culture, blood (Routine X 2) w Reflex to ID Panel     Status: None   Collection Time: 11/11/16  5:30 AM  Result Value Ref Range Status   Specimen Description BLOOD RIGHT HAND  Final   Special Requests IN PEDIATRIC BOTTLE 2CC  Final   Culture NO GROWTH 5 DAYS  Final   Report Status 11/16/2016 FINAL  Final  Respiratory Panel by PCR     Status: None   Collection Time: 11/11/16  5:34 AM  Result Value Ref Range Status   Adenovirus NOT DETECTED NOT DETECTED Final   Coronavirus 229E NOT DETECTED NOT DETECTED Final   Coronavirus HKU1 NOT DETECTED NOT DETECTED Final   Coronavirus NL63 NOT DETECTED NOT DETECTED Final   Coronavirus OC43 NOT DETECTED NOT DETECTED Final   Metapneumovirus NOT DETECTED NOT DETECTED Final   Rhinovirus / Enterovirus NOT DETECTED NOT DETECTED Final   Influenza A NOT DETECTED NOT DETECTED Final   Influenza B NOT DETECTED NOT DETECTED Final   Parainfluenza Virus 1 NOT DETECTED NOT DETECTED Final   Parainfluenza Virus 2 NOT DETECTED NOT DETECTED Final   Parainfluenza Virus 3 NOT DETECTED NOT DETECTED Final    Parainfluenza Virus 4 NOT DETECTED NOT DETECTED Final   Respiratory Syncytial Virus NOT DETECTED NOT DETECTED Final   Bordetella pertussis NOT DETECTED NOT DETECTED Final   Chlamydophila pneumoniae NOT DETECTED  NOT DETECTED Final   Mycoplasma pneumoniae NOT DETECTED NOT DETECTED Final  MRSA PCR Screening     Status: None   Collection Time: 11/11/16  6:42 AM  Result Value Ref Range Status   MRSA by PCR NEGATIVE NEGATIVE Final    Comment:        The GeneXpert MRSA Assay (FDA approved for NASAL specimens only), is one component of a comprehensive MRSA colonization surveillance program. It is not intended to diagnose MRSA infection nor to guide or monitor treatment for MRSA infections.   Culture, respiratory (NON-Expectorated)     Status: None   Collection Time: 11/11/16  8:00 AM  Result Value Ref Range Status   Specimen Description TRACHEAL ASPIRATE  Final   Special Requests Immunocompromised  Final   Gram Stain   Final    ABUNDANT WBC PRESENT,BOTH PMN AND MONONUCLEAR RARE SQUAMOUS EPITHELIAL CELLS PRESENT FEW GRAM POSITIVE COCCI IN PAIRS RARE GRAM NEGATIVE COCCI IN PAIRS RARE GRAM POSITIVE RODS    Culture Consistent with normal respiratory flora.  Final   Report Status 11/13/2016 FINAL  Final         Radiology Studies: No results found.      Scheduled Meds: . acetylcysteine  4 mL Nebulization BID  . clonazePAM  0.5 mg Oral QHS  . dextromethorphan-guaiFENesin  1 tablet Oral BID  . enoxaparin (LOVENOX) injection  40 mg Subcutaneous Q24H  . famotidine  20 mg Oral BID  . gi cocktail  30 mL Oral Once  . insulin aspart  0-20 Units Subcutaneous Q4H  . ipratropium-albuterol  3 mL Nebulization Q4H  . mouth rinse  15 mL Mouth Rinse BID  . methylPREDNISolone (SOLU-MEDROL) injection  60 mg Intravenous Q24H  . piperacillin-tazobactam (ZOSYN)  IV  3.375 g Intravenous Q8H  . sennosides  5 mL Per Tube BID  . vancomycin  1,000 mg Intravenous Q8H   Continuous Infusions:    LOS: 6 days    Time spent: 40 minutes    Fenna Semel A, MD [facility] Pager [phone]  If 7PM-7AM, please contact night-coverage [email or web address] Password [email or web address] 11/17/2016, 11:07 AM

## 2016-11-18 DIAGNOSIS — L8990 Pressure ulcer of unspecified site, unspecified stage: Secondary | ICD-10-CM | POA: Diagnosis not present

## 2016-11-18 LAB — CBC
HEMATOCRIT: 37.8 % — AB (ref 39.0–52.0)
HEMOGLOBIN: 12.3 g/dL — AB (ref 13.0–17.0)
MCH: 28.7 pg (ref 26.0–34.0)
MCHC: 32.5 g/dL (ref 30.0–36.0)
MCV: 88.3 fL (ref 78.0–100.0)
Platelets: 127 10*3/uL — ABNORMAL LOW (ref 150–400)
RBC: 4.28 MIL/uL (ref 4.22–5.81)
RDW: 16.8 % — AB (ref 11.5–15.5)
WBC: 8.1 10*3/uL (ref 4.0–10.5)

## 2016-11-18 LAB — BASIC METABOLIC PANEL
ANION GAP: 7 (ref 5–15)
BUN: 11 mg/dL (ref 6–20)
CHLORIDE: 95 mmol/L — AB (ref 101–111)
CO2: 33 mmol/L — ABNORMAL HIGH (ref 22–32)
Calcium: 9.1 mg/dL (ref 8.9–10.3)
Creatinine, Ser: 0.77 mg/dL (ref 0.61–1.24)
GFR calc Af Amer: >60 mL/min (ref >60)
GFR calc non Af Amer: >60 mL/min (ref >60)
Glucose, Bld: 154 mg/dL — ABNORMAL HIGH (ref 65–99)
POTASSIUM: 4.2 mmol/L (ref 3.5–5.1)
SODIUM: 135 mmol/L (ref 135–145)

## 2016-11-18 LAB — GLUCOSE, CAPILLARY
GLUCOSE-CAPILLARY: 151 mg/dL — AB (ref 65–99)
GLUCOSE-CAPILLARY: 121 mg/dL — AB (ref 65–99)
GLUCOSE-CAPILLARY: 135 mg/dL — AB (ref 65–99)
Glucose-Capillary: 115 mg/dL — ABNORMAL HIGH (ref 65–99)
Glucose-Capillary: 136 mg/dL — ABNORMAL HIGH (ref 65–99)
Glucose-Capillary: 139 mg/dL — ABNORMAL HIGH (ref 65–99)

## 2016-11-18 MED ORDER — METOCLOPRAMIDE HCL 5 MG/ML IJ SOLN
10.0000 mg | Freq: Three times a day (TID) | INTRAMUSCULAR | Status: DC
  Administered 2016-11-18 – 2016-11-19 (×3): 10 mg via INTRAVENOUS
  Filled 2016-11-18 (×3): qty 2

## 2016-11-18 NOTE — Progress Notes (Signed)
Pt has a stage 2 pressure ulcer on sacrum charted under skin assessment flow sheets. Per Pt's wife this was here before arrival to hospital. However, upon my assessment the site appears to be closed with a scar like appearance. Skin on sacrum is clean, dry and pink-red in appearance, slightly blanchable. There are no visible open areas. New foam applied. Will continue to monitor.

## 2016-11-18 NOTE — Progress Notes (Addendum)
PROGRESS NOTE    Ernest [name]  TKZ:[id number] DOB: 03-09-1956 DOA: 11/11/2016 PCP: [name], MD   Subjective: Feels much better than yesterday, was able to get up and walk around with physical therapy recommended home PT. Still nauseous not able to eat.  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 He has been more short of breath than usual for a few days. This was accompanied by congestion. No fever / chills / diarrhea / nausea / vomiting. His wife reports he felt like he had phlegm, but couldn't get it up. No hemoptysis. He was treating his congestion with OTC mucinex and had increased his rescue albuterol use. He went to bed doing okay (he always has to sleep sitting up), but then awoke around 3am and needed to go to the hospital. EMS was called and found him minimally responsive. His respirations were assisted with BVM en route. Once in the ED, he was intubated.   Labs showed elevated WBC (which seems chronic for him and may be chemo related), normal renal function, normal BNP, normal troponin, normal lactate. ABG was consistent with a respiratory acidosis. CXR with perhaps some interval enlargement in chronic right sided findings as well as interstitial prominence concerning for possible infection/postobstructive pneumonia vs acute edema.  Since being intubated, his mental status has improved. He is arousable and briskly follows commands. He denies pain.    Assessment & Plan:   Active Problems:   Acute respiratory failure with hypoxia and hypercapnia (HCC)   Goals of care, counseling/discussion   History of ETT   Postobstructive pneumonia   Non-small cell  cancer of right lung (HCC)   OSA (obstructive sleep apnea)   CAD in native artery   Type 2 diabetes mellitus with complication (HCC)   Pressure injury of skin   Acute hypoxemic/hypercapneic respiratory failure/COPD/asthma exacerbation/postobstructive pneumonia -Completed intended course of antibiotics - he is on 3 L of oxygen, this is his home baseline -Extubated 12/21 -Requiring 4L  when ambulating -DuoNebs q 4h scheduled -Albuterol q2h prn -Continue steroids, flutter valve and Mucinex, ambulate as tolerated.  Refractory nausea -Nausea without vomiting, patient has stage IV NSCLC, last MRI was on 07/23/2016 without metastasis to the brain. -Nausea improved with antiemetics, treat symptomatically. -Patient was started on Thorazine as it did not respond to Phenergan, still complaining about nausea. -I will schedule the Reglan rather than putting an as needed  Stage IV NSCLC -Spoke with Dr. Alen Blew oncology who stated Dr. Curt Bears will see patient on 12/25  OSA. -CPAP per respiratory  CAD native artery   DM type II   controlled with complication  -   resistant SSI  Refractory Nausea -Thorazine 12.5 mg IV PRN refractory nausea not respond to Phenergan  Pressure skin injury  -Stage 2 sacral decub ulcer. Pt reported this new, was not there on admission.   DVT prophylaxis: Lovenox Code Status: Partial Family Communication: None Disposition Plan: ?   Consultants:  Oncology pending  Procedures/Significant Events:  NA   VENTILATOR SETTINGS: NA   Cultures Blood cx x 2  12/18 >>NTD UA 12/18>>>NTD Resp quant 12/18>>>NTD RVP>>>NTD    Antimicrobials: Anti-infectives  Start     Stop   11/13/16 2200  vancomycin (VANCOCIN) IVPB 1000 mg/200 mL premix     11/18/16 1359   11/11/16 1400  vancomycin (VANCOCIN) IVPB 750 mg/150 ml premix  Status:  Discontinued     11/13/16 1608   11/11/16 1400  piperacillin-tazobactam (ZOSYN) IVPB 3.375 g     11/18/16  1359   11/11/16 0445  vancomycin (VANCOCIN) IVPB 1000 mg/200 mL premix     11/11/16 0630   11/11/16 0445  piperacillin-tazobactam (ZOSYN) IVPB 3.375 g     11/11/16 0600       Devices    LINES / TUBES:  OETT 12/18>>>12/21 OGT 12/18>>>12/21 PIV    Continuous Infusions:   Objective: Vitals:   11/18/16 0300 11/18/16 0434 11/18/16 0536 11/18/16 0752  BP:   138/72   Pulse:   88   Resp:   18   Temp:   97.7 F (36.5 C)   TempSrc:   Oral   SpO2:  100% 100% 98%  Weight: 74.3 kg (163 lb 14.4 oz)     Height:        Intake/Output Summary (Last 24 hours) at 11/18/16 1130 Last data filed at 11/18/16 1033  Gross per 24 hour  Intake              800 ml  Output             4150 ml  Net            -3350 ml   Filed Weights   11/14/16 0500 11/15/16 0500 11/18/16 0300  Weight: 75 kg (165 lb 5.5 oz) 76.4 kg (168 lb 6.9 oz) 74.3 kg (163 lb 14.4 oz)    Examination:  General: A/O 4, positive acute on chronic acute respiratory distress Eyes: negative scleral hemorrhage, negative anisocoria, negative icterus ENT: Negative Runny nose, negative gingival bleeding, Neck:  Negative scars, masses, torticollis, lymphadenopathy, JVD Lungs: positive diffuse rhonchi Rt>>Lt. positive inspiratory and expiratory wheezing Rt>>Lt  Cardiovascular: Regular rate and rhythm without murmur gallop or rub normal S1 and S2 Abdomen: negative abdominal pain, nondistended, positive soft, bowel sounds, no rebound, no ascites, no appreciable mass Extremities: No significant cyanosis, clubbing, or edema bilateral lower extremities Skin: Negative rashes, lesions, ulcers Psychiatric:  Negative depression, negative anxiety, negative fatigue, negative mania  Central nervous system:  Cranial nerves II through XII intact, tongue/uvula midline, all extremities muscle strength 5/5, sensation intact throughout, negative dysarthria, negative expressive aphasia, negative receptive aphasia.  .     Data Reviewed: Care  during the described time interval was provided by me .  I have reviewed this patient's available data, including medical history, events of note, physical examination, and all test results as part of my evaluation. I have personally reviewed and interpreted all radiology studies.  CBC:  Recent Labs Lab 11/14/16 0631 11/15/16 0534 11/16/16 0229 11/17/16 0456 11/18/16 0504  WBC 11.4* 15.5* 14.4* 11.8* 8.1  NEUTROABS  --   --   --  11.4*  --   HGB 12.5* 12.1* 11.7* 12.1* 12.3*  HCT 39.3 38.4* 37.0* 38.9* 37.8*  MCV 89.5 89.9 90.5 92.2 88.3  PLT 88* 104* 114* 121* 287*   Basic Metabolic Panel:  Recent Labs Lab 11/12/16 0231 11/13/16 0328 11/14/16 0631 11/15/16 0534 11/16/16 0229 11/17/16 0456 11/18/16 0504  NA 134* 133* 135 135 134* 130* 135  K 4.5 4.1 3.4* 3.3* 3.6 5.1 4.2  CL 97* 93* 94* 96* 94* 95* 95*  CO2 27  30 33* '29 31 28 '$ 33*  GLUCOSE 148* 97 133* 94 97 121* 154*  BUN 21* 22* 31* 28* '19 16 11  '$ CREATININE 0.83 0.75 0.81 0.75 0.63 0.78 0.77  CALCIUM 8.8* 9.0 9.2 9.1 8.7* 8.7* 9.1  MG 2.2 2.0 2.2 2.0 2.1 2.3  --   PHOS 2.4* 4.4 4.2 2.5 2.2*  --   --    GFR: Estimated Creatinine Clearance: 87.5 mL/min (by C-G formula based on SCr of 0.77 mg/dL). Liver Function Tests: No results for input(s): AST, ALT, ALKPHOS, BILITOT, PROT, ALBUMIN in the last 168 hours. No results for input(s): LIPASE, AMYLASE in the last 168 hours. No results for input(s): AMMONIA in the last 168 hours. Coagulation Profile: No results for input(s): INR, PROTIME in the last 168 hours. Cardiac Enzymes: No results for input(s): CKTOTAL, CKMB, CKMBINDEX, TROPONINI in the last 168 hours. BNP (last 3 results) No results for input(s): PROBNP in the last 8760 hours. HbA1C: No results for input(s): HGBA1C in the last 72 hours. CBG:  Recent Labs Lab 11/17/16 1718 11/17/16 2139 11/18/16 0007 11/18/16 0427 11/18/16 0816  GLUCAP 134* 95 139* 136* 151*   Lipid Profile: No results for input(s):  CHOL, HDL, LDLCALC, TRIG, CHOLHDL, LDLDIRECT in the last 72 hours. Thyroid Function Tests: No results for input(s): TSH, T4TOTAL, FREET4, T3FREE, THYROIDAB in the last 72 hours. Anemia Panel: No results for input(s): VITAMINB12, FOLATE, FERRITIN, TIBC, IRON, RETICCTPCT in the last 72 hours. Urine analysis:    Component Value Date/Time   COLORURINE AMBER (A) 05/18/2016 1310   APPEARANCEUR CLEAR 05/18/2016 1310   LABSPEC 1.019 05/18/2016 1310   PHURINE 7.0 05/18/2016 1310   GLUCOSEU NEGATIVE 05/18/2016 1310   GLUCOSEU NEGATIVE 10/05/2015 0958   HGBUR NEGATIVE 05/18/2016 1310   BILIRUBINUR SMALL (A) 05/18/2016 1310   BILIRUBINUR n 08/14/2011 1449   KETONESUR 15 (A) 05/18/2016 1310   PROTEINUR Negative 10/22/2016 1137   UROBILINOGEN 0.2 10/05/2015 0958   NITRITE NEGATIVE 05/18/2016 1310   LEUKOCYTESUR NEGATIVE 05/18/2016 1310   Sepsis Labs: '@LABRCNTIP'$ (procalcitonin:4,lacticidven:4)  ) Recent Results (from the past 240 hour(s))  Culture, blood (Routine X 2) w Reflex to ID Panel     Status: None   Collection Time: 11/11/16  4:52 AM  Result Value Ref Range Status   Specimen Description BLOOD RIGHT ANTECUBITAL  Final   Special Requests BOTTLES DRAWN AEROBIC AND ANAEROBIC 5CC EA  Final   Culture NO GROWTH 5 DAYS  Final   Report Status 11/16/2016 FINAL  Final  Culture, blood (Routine X 2) w Reflex to ID Panel     Status: None   Collection Time: 11/11/16  5:30 AM  Result Value Ref Range Status   Specimen Description BLOOD RIGHT HAND  Final   Special Requests IN PEDIATRIC BOTTLE 2CC  Final   Culture NO GROWTH 5 DAYS  Final   Report Status 11/16/2016 FINAL  Final  Respiratory Panel by PCR     Status: None   Collection Time: 11/11/16  5:34 AM  Result Value Ref Range Status   Adenovirus NOT DETECTED NOT DETECTED Final   Coronavirus 229E NOT DETECTED NOT DETECTED Final   Coronavirus HKU1 NOT DETECTED NOT DETECTED Final   Coronavirus NL63 NOT DETECTED NOT DETECTED Final   Coronavirus  OC43 NOT DETECTED NOT DETECTED Final   Metapneumovirus NOT DETECTED NOT DETECTED Final   Rhinovirus / Enterovirus NOT DETECTED NOT DETECTED Final   Influenza A NOT DETECTED NOT DETECTED Final   Influenza B NOT DETECTED  NOT DETECTED Final   Parainfluenza Virus 1 NOT DETECTED NOT DETECTED Final   Parainfluenza Virus 2 NOT DETECTED NOT DETECTED Final   Parainfluenza Virus 3 NOT DETECTED NOT DETECTED Final   Parainfluenza Virus 4 NOT DETECTED NOT DETECTED Final   Respiratory Syncytial Virus NOT DETECTED NOT DETECTED Final   Bordetella pertussis NOT DETECTED NOT DETECTED Final   Chlamydophila pneumoniae NOT DETECTED NOT DETECTED Final   Mycoplasma pneumoniae NOT DETECTED NOT DETECTED Final  MRSA PCR Screening     Status: None   Collection Time: 11/11/16  6:42 AM  Result Value Ref Range Status   MRSA by PCR NEGATIVE NEGATIVE Final    Comment:        The GeneXpert MRSA Assay (FDA approved for NASAL specimens only), is one component of a comprehensive MRSA colonization surveillance program. It is not intended to diagnose MRSA infection nor to guide or monitor treatment for MRSA infections.   Culture, respiratory (NON-Expectorated)     Status: None   Collection Time: 11/11/16  8:00 AM  Result Value Ref Range Status   Specimen Description TRACHEAL ASPIRATE  Final   Special Requests Immunocompromised  Final   Gram Stain   Final    ABUNDANT WBC PRESENT,BOTH PMN AND MONONUCLEAR RARE SQUAMOUS EPITHELIAL CELLS PRESENT FEW GRAM POSITIVE COCCI IN PAIRS RARE GRAM NEGATIVE COCCI IN PAIRS RARE GRAM POSITIVE RODS    Culture Consistent with normal respiratory flora.  Final   Report Status 11/13/2016 FINAL  Final         Radiology Studies: No results found.      Scheduled Meds: . acetylcysteine  4 mL Nebulization BID  . clonazePAM  0.5 mg Oral QHS  . enoxaparin (LOVENOX) injection  40 mg Subcutaneous Q24H  . famotidine  20 mg Oral BID  . gi cocktail  30 mL Oral Once  .  guaiFENesin  1,200 mg Oral BID  . insulin aspart  0-20 Units Subcutaneous Q4H  . ipratropium-albuterol  3 mL Nebulization Q4H  . mouth rinse  15 mL Mouth Rinse BID  . methylPREDNISolone (SOLU-MEDROL) injection  60 mg Intravenous Q24H  . metoCLOPramide (REGLAN) injection  10 mg Intravenous Q8H  . piperacillin-tazobactam (ZOSYN)  IV  3.375 g Intravenous Q8H  . sennosides  5 mL Per Tube BID  . vancomycin  1,000 mg Intravenous Q8H   Continuous Infusions:   LOS: 7 days    Time spent: 40 minutes    Vince Ainsley A, MD [facility] Pager [phone]  If 7PM-7AM, please contact night-coverage [email or web address] Password [email or web address] 11/18/2016, 11:30 AM

## 2016-11-18 NOTE — Progress Notes (Signed)
Patient continues to refuse CPAP for the night. RT will continue to monitor.

## 2016-11-18 NOTE — Progress Notes (Signed)
Chart reviewed. Pt appears to be making good functional progress.  PT recommending home with HH.  Will defer formal rehab consult at this time.  Delice Lesch, MD, [facility]

## 2016-11-19 ENCOUNTER — Ambulatory Visit: Payer: Commercial Managed Care - HMO | Admitting: Neurology

## 2016-11-19 ENCOUNTER — Other Ambulatory Visit: Payer: Commercial Managed Care - HMO

## 2016-11-19 DIAGNOSIS — L89152 Pressure ulcer of sacral region, stage 2: Secondary | ICD-10-CM

## 2016-11-19 LAB — GLUCOSE, CAPILLARY
GLUCOSE-CAPILLARY: 134 mg/dL — AB (ref 65–99)
GLUCOSE-CAPILLARY: 193 mg/dL — AB (ref 65–99)
Glucose-Capillary: 144 mg/dL — ABNORMAL HIGH (ref 65–99)
Glucose-Capillary: 111 mg/dL — ABNORMAL HIGH (ref 65–99)

## 2016-11-19 MED ORDER — AMOXICILLIN-POT CLAVULANATE 875-125 MG PO TABS: 1.0000 | ORAL_TABLET | Freq: Two times a day (BID) | ORAL | 0 refills | Status: AC

## 2016-11-19 NOTE — Discharge Summary (Signed)
Physician Discharge Summary  Ernest [name] ZJQ:[id number] DOB: [birth year] DOA: 11/11/2016  PCP: [name], MD  Admit date: 11/11/2016 Discharge date: 11/19/2016  Admitted From: Home Disposition: Home  Recommendations for Outpatient Follow-up:  1. Follow up with PCP in 1-2 weeks 2. Please obtain BMP/CBC in one week  Home Health: PT Equipment/Devices:NA  Discharge Condition: Stable CODE STATUS: Partial Code Diet recommendation: Diet Heart Room service appropriate? 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 He has been more short of breath than usual for a few days. This was accompanied by congestion. No fever / chills / diarrhea / nausea / vomiting. His wife reports he felt like he had phlegm, but couldn't get it up. No hemoptysis. He was treating his congestion with OTC mucinex and had increased his rescue albuterol use. He went to bed doing okay (he always has to sleep sitting up), but then awoke around 3am and needed to go to the hospital. EMS was called and found him minimally responsive. His respirations were assisted with BVM en route. Once in the ED, he was intubated.   Labs showed elevated WBC (which seems chronic for him and may be chemo related), normal renal function, normal BNP, normal troponin, normal lactate. ABG was consistent with a respiratory acidosis. CXR with perhaps some interval enlargement in chronic right sided findings as well as interstitial prominence concerning for possible infection/postobstructive pneumonia vs acute edema.  Discharge Diagnoses:  Active Problems:    Acute respiratory failure with hypoxia and hypercapnia (HCC)   Goals of care, counseling/discussion   History of ETT   Postobstructive pneumonia   Non-small cell cancer of right lung (HCC)   OSA (obstructive sleep apnea)   CAD in native artery   Type 2 diabetes mellitus with complication (HCC)   Pressure injury of skin   Acute hypoxemic/hypercapneic respiratory failure/COPD/asthma exacerbation/postobstructive pneumonia -Completed intended course of antibiotics - he is on 3 L of oxygen, this is his home baseline -Extubated 12/21 -Requiring 4L [place] when ambulating -DuoNebs q 4h scheduled -Albuterol q2h prn -Continue steroids, flutter valve and Mucinex, ambulate as tolerated. -Treated with vancomycin and Zosyn while he was in the hospital, on discharge Augmentin for 5 more days.  Refractory nausea -Nausea without vomiting, patient has stage IV NSCLC, last MRI was on 07/23/2016 without metastasis to the brain. -Nausea improved with antiemetics, treat symptomatically. -Patient was started on Thorazine as it did not respond to Phenergan, still complaining about nausea. -This is improved  Stage IV NSCLC -Follow with [name] as outpatient, has appointment next week  OSA. -CPAP per respiratory  CAD native artery   DM type II  controlled with complication  -Was on resistant SSI in the hospital, home medications restarted on discharge.  Pressure skin injury  -Stage 2 sacral decub ulcer. Pt reported this new, after confirmation with wife patient had this prior to admission.  Discharge Instructions  Discharge Instructions    Diet - low sodium heart healthy    Complete by:  As directed    Increase activity slowly    Complete by:  As directed  Allergies as of 11/19/2016      Reactions   Carboplatin Shortness Of Breath, Swelling, Rash   Swelling of lips, rash on face,eyes and head      Medication List    STOP taking these medications   levofloxacin 500 MG  tablet Commonly known as:  LEVAQUIN     TAKE these medications   ADVAIR DISKUS 500-50 MCG/DOSE Aepb Generic drug:  Fluticasone-Salmeterol 1 PUFF THEN RINSE MOUTH, TWICE DAILY MAINTENANCE   ALPRAZolam 0.5 MG tablet Commonly known as:  XANAX Take 0.5-1 mg by mouth every 4 (four) hours as needed (for breathing-related anxiety).   amoxicillin-clavulanate 875-125 MG tablet Commonly known as:  AUGMENTIN Take 1 tablet by mouth 2 (two) times daily.   arformoterol 15 MCG/2ML Nebu Commonly known as:  BROVANA Take 2 mLs (15 mcg total) by nebulization 2 (two) times daily. What changed:  Another medication with the same name was removed. Continue taking this medication, and follow the directions you see here.   ARIPiprazole 2 MG tablet Commonly known as:  ABILIFY Take 2 mg by mouth every morning.   Armodafinil 250 MG tablet Take 250 mg by mouth every morning.   aspirin 81 MG EC tablet TAKE 1 TABLET BY MOUTH EVERY DAY   atorvastatin 20 MG tablet Commonly known as:  LIPITOR TAKE 1 TABLET EVERY DAY What changed:  See the new instructions.   ciclopirox 0.77 % cream Commonly known as:  LOPROX Apply topically 2 (two) times daily.   clonazePAM 1 MG tablet Commonly known as:  KLONOPIN Take 2 mg by mouth at bedtime as needed for anxiety (or sleep). Reported on 01/16/2016   DALIRESP 500 MCG Tabs tablet Generic drug:  roflumilast Take 500 mcg by mouth daily.   dexamethasone 4 MG tablet Commonly known as:  DECADRON 2 tablets by mouth twice a day the day before, day of and day after the chemotherapy every 3 weeks   dextromethorphan-guaiFENesin 30-600 MG 12hr tablet Commonly known as:  MUCINEX DM Take 1 tablet by mouth 2 (two) times daily as needed for cough (congestion).   esomeprazole 40 MG capsule Commonly known as:  NEXIUM TAKE ONE CAPSULE BY MOUTH EVERY DAY What changed:  See the new instructions.   feeding supplement (ENSURE ENLIVE) Liqd Take 237 mLs by mouth 2 (two) times  daily between meals.   fluconazole 100 MG tablet Commonly known as:  DIFLUCAN Take 1 tablet (100 mg total) by mouth daily.   flunisolide 25 MCG/ACT (0.025%) Soln Commonly known as:  NASALIDE Place 1 spray into the nose daily as needed for allergies.   FLUoxetine 40 MG capsule Commonly known as:  PROZAC Take 40 mg by mouth every morning.   Gabapentin Enacarbil ER 300 MG Tbcr Commonly known as:  HORIZANT Take 1 capsule by mouth daily.   glipiZIDE 5 MG tablet Commonly known as:  GLUCOTROL Take 2.5 mg by mouth daily.   indomethacin 25 MG capsule Commonly known as:  INDOCIN Take 1 tablet three times daily with meals x 3 days, then increase to 2 tablet three times daily x 3 days. What changed:  additional instructions   KLOR-CON M20 20 MEQ tablet Generic drug:  potassium chloride SA TAKE 1 TABLET BY MOUTH EVERY MORNING   LORazepam 0.5 MG tablet Commonly known as:  ATIVAN Take 1 tablet (0.5 mg total) by mouth 2 (two) times daily as needed for anxiety. Do not take when taking Klonopin.   magic mouthwash Soln Use as directed 5 mLs in  the mouth or throat 4 (four) times daily as needed.   Methylnaltrexone Bromide 150 MG Tabs Commonly known as:  RELISTOR Take 3 tablets by mouth every morning.   metoprolol succinate 25 MG 24 hr tablet Commonly known as:  TOPROL-XL Take 12.5 mg by mouth daily. What changed:  Another medication with the same name was added. Make sure you understand how and when to take each.   metoprolol succinate 25 MG 24 hr tablet Commonly known as:  TOPROL-XL TAKE 1/2 TABLET BY MOUTH DAILY What changed:  You were already taking a medication with the same name, and this prescription was added. Make sure you understand how and when to take each.   mirtazapine 45 MG tablet Commonly known as:  REMERON Take 1 tablet (45 mg total) by mouth at bedtime.   morphine 30 MG tablet Commonly known as:  MSIR Take 30 mg by mouth every 12 (twelve) hours.   morphine 30  MG 12 hr tablet Commonly known as:  MS CONTIN Take 1 tablet (30 mg total) by mouth every 12 (twelve) hours.   morphine 15 MG tablet Commonly known as:  MSIR Take 15 mg by mouth every 4 (four) hours as needed for severe pain.   nitroGLYCERIN 0.4 MG SL tablet Commonly known as:  NITROSTAT Place 1 tablet (0.4 mg total) under the tongue every 5 (five) minutes as needed for chest pain.   ondansetron 8 MG tablet Commonly known as:  ZOFRAN TAKE 1 TABLET (8 MG TOTAL) BY MOUTH EVERY 8 (EIGHT) HOURS AS NEEDED FOR NAUSEA OR VOMITING.   OXYGEN Inhale 2 L into the lungs daily.   pantoprazole 40 MG tablet Commonly known as:  PROTONIX Take 40 mg by mouth daily.   predniSONE 10 MG tablet Commonly known as:  DELTASONE TAKE 4 X 2 DAYS, 3 X 2 DAYS, 2 X 2 DAYS, 1 X 2 DAYS THEN BACK TO MAINTENANCE DOSE   predniSONE 10 MG tablet Commonly known as:  DELTASONE TAKE 4 X 2 DAYS, 3 X 2 DAYS, 2 X 2 DAYS, 1 X 2 DAYS THEN BACK TO MAINTENANCE DOSE   predniSONE 5 MG tablet Commonly known as:  DELTASONE Take 5 mg by mouth daily.   prochlorperazine 10 MG tablet Commonly known as:  COMPAZINE Take 1 tablet (10 mg total) by mouth every 6 (six) hours as needed for nausea or vomiting.   tamsulosin 0.4 MG Caps capsule Commonly known as:  FLOMAX Take 0.4 mg by mouth daily.   theophylline 400 MG 24 hr tablet Commonly known as:  UNIPHYL Take 1 tablet (400 mg total) by mouth daily.   Tiotropium Bromide Monohydrate 1.25 MCG/ACT Aers Commonly known as:  SPIRIVA RESPIMAT Inhale 2 puffs into the lungs daily.   topiramate 25 MG tablet Commonly known as:  TOPAMAX Take 25 mg by mouth daily.   traZODone 150 MG tablet Commonly known as:  DESYREL Take 150 mg by mouth at bedtime.   VENTOLIN HFA 108 (90 Base) MCG/ACT inhaler Generic drug:  albuterol INHALE 2 PUFFS INTO THE LUNGS 4 TIMES DAILY AS NEEDED FOR WHEEZING What changed:  Another medication with the same name was removed. Continue taking this  medication, and follow the directions you see here.   albuterol (2.5 MG/3ML) 0.083% nebulizer solution Commonly known as:  PROVENTIL INHALE 1 VIAL IN NEBULIZER EVERY 4 HOURS AS NEEDED FOR WHEEZING OR SHORTNESS OF BREATH What changed:  Another medication with the same name was removed. Continue taking this medication, and follow  the directions you see here.       Allergies  Allergen Reactions  . Carboplatin Shortness Of Breath, Swelling and Rash    Swelling of lips, rash on face,eyes and head    Consultations:  PCCM   Procedures (Echo, Carotid, EGD, Colonoscopy, ERCP)   Radiological studies: Dg Chest 2 View  Result Date: 10/22/2016 CLINICAL DATA:  Increasing shortness of breath and RIGHT chest pain for 1 day. History of lung cancer in COPD. EXAM: CHEST  2 VIEW COMPARISON:  Chest radiograph September 12, 2016 and CT chest July 24, 2016 FINDINGS: Lobular conformity of RIGHT mid and superior mediastinal corresponding to known lymphadenopathy. Similar RIGHT lower lobe pulmonary mass. Cardiac silhouette is upper limits of normal in size. Similar LEFT apical pleural thickening and scarring. Mildly increased lung volumes with blunting of the RIGHT costophrenic angle compatible with pleural thickening. Similar scarring RIGHT mid lung zone. No pneumothorax. ACDF. Soft tissue planes are nonsuspicious. IMPRESSION: Borderline cardiomegaly and COPD.  No acute pulmonary process. Similar radiographic appearance of mediastinal lymphadenopathy and RIGHT lower lobe lung mass. Electronically Signed   By: Elon Alas M.D.   On: 10/22/2016 14:07   Dg Chest Port 1 View  Result Date: 11/14/2016 CLINICAL DATA:  Intubation. EXAM: PORTABLE CHEST 1 VIEW COMPARISON:  11/13/2016 .  09/12/2016. FINDINGS: Endotracheal tube and NG tube in stable position. Persistent severe mediastinal adenopathy and large right lung mass without interim change again noted. No prominent pleural effusion. No pneumothorax. Left  apical pleural thickening unchanged. Prior cervicothoracic spine fusion . IMPRESSION: 1. Lines and tubes in stable position. 2. Persistent severe mediastinal adenopathy and right lung mass without interim change. Electronically Signed   By: Marcello Moores  Register   On: 11/14/2016 07:34   Dg Chest Port 1 View  Result Date: 11/13/2016 CLINICAL DATA:  Endotracheal tube. EXAM: PORTABLE CHEST 1 VIEW COMPARISON:  11/12/2016 FINDINGS: Endotracheal tube projects between the clavicular heads and carina, unchanged. Enteric tube courses into the left upper abdomen with tip not imaged and side hole just beyond the GE junction. Right peritracheal and right lower lobe masses are similar to the prior study. Patchy airspace and interstitial opacities in the right lung predominantly involve the upper lobe and also have not significantly changed. Chronic apical left upper lobe consolidation and pleural thickening/loculated fluid are unchanged. No pneumothorax is identified. IMPRESSION: 1. Unchanged right lung opacities concerning for pneumonia. 2. Unchanged right paratracheal and right lower lobe masses. Electronically Signed   By: Logan Bores M.D.   On: 11/13/2016 08:52   Dg Chest Port 1 View  Result Date: 11/12/2016 CLINICAL DATA:  Endotracheal tube placement. EXAM: PORTABLE CHEST 1 VIEW COMPARISON:  Radiographs of November 11, 2016. FINDINGS: Endotracheal and nasogastric tubes are unchanged in position. Stable apical pleural thickening and scarring is noted. No pneumothorax is noted. Stable right peritracheal mass and right lower lobe mass is noted. Mild interstitial densities are noted throughout right lung concerning for edema or possibly inflammation. Bony thorax is unremarkable. IMPRESSION: Stable support apparatus. Stable right peritracheal mass and right lower lobe mass consistent with malignancy. Mild interstitial densities are noted throughout right lung concerning for edema or possibly inflammation. Electronically  Signed   By: Marijo Conception, M.D.   On: 11/12/2016 08:12   Dg Chest Portable 1 View  Result Date: 11/11/2016 CLINICAL DATA:  Emergent intubation. Shortness of breath. History of lung cancer. EXAM: PORTABLE CHEST 1 VIEW COMPARISON:  Chest radiograph October 22, 2016 FINDINGS: Endotracheal tube tip projects 3  cm above the carina. Negative nasogastric tube tip and side-port project in proximal stomach. Multiple masses project in RIGHT lung, and appear larger than prior examination. LEFT apical pleural thickening and scarring. Predominately in RIGHT interstitial prominence. Increased lung volumes. No pleural effusions. No pneumothorax. Mild cardiomegaly. ACDF. Osseous structure nonsuspicious. IMPRESSION: Endotracheal tube tip projects 3 cm above the carina. Nasogastric tube tip projects in proximal stomach. Multiple RIGHT lung/lymphadenopathy masses appear larger though, this could be technical. Interstitial prominence RIGHT lung concerning for infection/postobstructive pneumonia versus acute edema. Electronically Signed   By: Elon Alas M.D.   On: 11/11/2016 04:14   Ct Angio Chest/abd/pel For Dissection W And/or Wo Contrast  Result Date: 11/11/2016 CLINICAL DATA:  60 year old male with elevated D-dimer and respiratory distress and hypoxia. History of lung cancer. EXAM: CT ANGIOGRAPHY CHEST, ABDOMEN AND PELVIS TECHNIQUE: Multidetector CT imaging through the chest, abdomen and pelvis was performed using the standard protocol during bolus administration of intravenous contrast. Multiplanar reconstructed images and MIPs were obtained and reviewed to evaluate the vascular anatomy. CONTRAST:  100 cc Isovue 370 COMPARISON:  Chest radiograph dated 11/11/2016 and chest CT dated 07/24/2016 and 06/07/2016 FINDINGS: CTA CHEST FINDINGS Cardiovascular: Top-normal cardiac size with mild dilatation of the left ventricle. No pericardial effusion. There is coronary vascular calcification primarily involving the LAD  and left circumflex artery. There is mild atherosclerotic plaque in the thoracic aorta. There is no aneurysmal dilatation or evidence of dissection. The origin of the right vertebral artery is not well visualized and is artery may be hypoplastic. The remainder of the visualized origins of the great vessels of the aortic arch appear patent. There is mild dilatation of the main pulmonary trunk suggestive of underlying pulmonary hypertension. No CT evidence of pulmonary embolism. Mediastinum/Nodes: Significant interval progression of right hilar and mediastinal adenopathy. Subcarinal adenopathy measures 6.5 x 9.5 cm (previously approximately 3.6 x 3.1 cm). Right upper mediastinal/paratracheal adenopathy or confluence of lymph nodes measure approximately 7.7 x 8.2 cm (previously 4.1 x 3.8 cm). There is loss of right paratracheal fat plane. An enteric tube is noted in the esophagus. There is infiltration of the paraesophageal fat. There is encasement and mild narrowing of the right upper lobe bronchus. There is also encasement of the bronchus intermedius. The central airways however remain patent. Lungs/Pleura: There is a 10.2 x 7.3 cm right lower lobe mass (previously 6.3 x 5.3 cm). This mass appears in continuity with the subcarinal mass. There is large area of airspace consolidation in the right upper lobe stop, most likely representing superimposed pneumonia. A 12 mm nodular density in the right upper lobe anteriorly likely pneumonia and less likely metastatic disease. There has been interval increase in the size of the left upper lobe consolidation/fibrosis and cavitation. There is a small right pleural effusion. No pneumothorax. There is mild right-sided tracheal shift likely related to right lung volume loss or traction fibrosis. An endotracheal diffuse is noted with tip approximately 3.5 cm above the carina. Musculoskeletal: There is no axillary adenopathy. The chest wall soft tissues appear unremarkable. Lower  cervical anterior fusion hardware. No acute fracture. Review of the MIP images confirms the above findings. CTA ABDOMEN AND PELVIS FINDINGS VASCULAR Aorta: Moderate aortoiliac atherosclerotic disease. No aneurysmal dilatation or evidence of dissection Celiac: Patent without evidence of aneurysm, dissection, vasculitis or significant stenosis. SMA: Patent without evidence of aneurysm, dissection, vasculitis or significant stenosis. Renals: Both renal arteries are patent without evidence of aneurysm, dissection, vasculitis, fibromuscular dysplasia or significant stenosis. IMA: Patent without  evidence of aneurysm, dissection, vasculitis or significant stenosis. Inflow: Advanced atherosclerotic calcification of the iliac vasculature. Left external iliac artery stent appears patent. Veins: No obvious venous abnormality within the limitations of this arterial phase study. Review of the MIP images confirms the above findings. NON-VASCULAR No intra-abdominal free air or free fluid. Hepatobiliary: Slight irregularity of the hepatic contour. No enhancing lesion. No intrahepatic biliary ductal dilatation. The gallbladder is unremarkable. Pancreas: Unremarkable. No pancreatic ductal dilatation or surrounding inflammatory changes. Spleen: A 5.1 x 3.5 cm inferior splenic hypodense lesion, incompletely characterized but similar to prior CT. Adrenals/Urinary Tract: The adrenal glands appear unremarkable. Probable small right renal cyst as seen on the prior CT. There is no hydronephrosis on either side. The visualized ureters and urinary bladder appear unremarkable. Stomach/Bowel: An enteric tube is noted with tip in the proximal stomach. Large amount of stool noted throughout the colon. There is sigmoid diverticulosis without active inflammatory changes. Focal thickening of the sigmoid colon likely related to underdistention and muscular hypertrophy. The degree of stricture is not excluded. There is no evidence of bowel obstruction  or active inflammation. Normal appendix. Lymphatic: Top-normal retroperitoneal and para-aortic lymph nodes. Reproductive: The prostate and seminal vesicles are grossly unremarkable. Other: None Musculoskeletal: L4-L5 posterior fusion hardware. No acute fracture. No suspicious bone lesions. Chronic irregularity of the left iliac crest. No acute fracture. Review of the MIP images confirms the above findings. IMPRESSION: No CT evidence of pulmonary embolism or aortic dissection. Progression of disease with interval increase in the size of the right lower lobe mass with significant interval decrease in the size of the right hilar and mediastinal adenopathy. Large upper lobe airspace consolidative changes most compatible with superimposed pneumonia. Small right pleural effusion. Left apical consolidative change, fibrosis and small cavitation similar to the prior CT. Stable appearing inferior splenic hypodense lesion. Top-normal retroperitoneal and para-aortic lymph nodes. Constipation. No evidence of bowel obstruction or active inflammation. Normal appendix. Sigmoid diverticulosis without active inflammatory changes. Short segment thickened appearance sigmoid colon likely related to muscular hypertrophy with possible mild stricture. Is Electronically Signed   By: Anner Crete M.D.   On: 11/11/2016 07:12     Subjective:  Discharge Exam: Vitals:   11/18/16 2327 11/19/16 0259 11/19/16 0449 11/19/16 0721  BP:   114/73   Pulse:   88   Resp:   18   Temp:   98.1 F (36.7 C)   TempSrc:   Oral   SpO2: 97% 97% 100% 97%  Weight:      Height:       General: Pt is alert, awake, not in acute distress Cardiovascular: RRR, S1/S2 +, no rubs, no gallops Respiratory: CTA bilaterally, no wheezing, no rhonchi Abdominal: Soft, NT, ND, bowel sounds + Extremities: no edema, no cyanosis   The results of significant diagnostics from this hospitalization (including imaging, microbiology, ancillary and laboratory) are  listed below for reference.    Microbiology: Recent Results (from the past 240 hour(s))  Culture, blood (Routine X 2) w Reflex to ID Panel     Status: None   Collection Time: 11/11/16  4:52 AM  Result Value Ref Range Status   Specimen Description BLOOD RIGHT ANTECUBITAL  Final   Special Requests BOTTLES DRAWN AEROBIC AND ANAEROBIC 5CC EA  Final   Culture NO GROWTH 5 DAYS  Final   Report Status 11/16/2016 FINAL  Final  Culture, blood (Routine X 2) w Reflex to ID Panel     Status: None   Collection Time:  11/11/16  5:30 AM  Result Value Ref Range Status   Specimen Description BLOOD RIGHT HAND  Final   Special Requests IN PEDIATRIC BOTTLE 2CC  Final   Culture NO GROWTH 5 DAYS  Final   Report Status 11/16/2016 FINAL  Final  Respiratory Panel by PCR     Status: None   Collection Time: 11/11/16  5:34 AM  Result Value Ref Range Status   Adenovirus NOT DETECTED NOT DETECTED Final   Coronavirus 229E NOT DETECTED NOT DETECTED Final   Coronavirus HKU1 NOT DETECTED NOT DETECTED Final   Coronavirus NL63 NOT DETECTED NOT DETECTED Final   Coronavirus OC43 NOT DETECTED NOT DETECTED Final   Metapneumovirus NOT DETECTED NOT DETECTED Final   Rhinovirus / Enterovirus NOT DETECTED NOT DETECTED Final   Influenza A NOT DETECTED NOT DETECTED Final   Influenza B NOT DETECTED NOT DETECTED Final   Parainfluenza Virus 1 NOT DETECTED NOT DETECTED Final   Parainfluenza Virus 2 NOT DETECTED NOT DETECTED Final   Parainfluenza Virus 3 NOT DETECTED NOT DETECTED Final   Parainfluenza Virus 4 NOT DETECTED NOT DETECTED Final   Respiratory Syncytial Virus NOT DETECTED NOT DETECTED Final   Bordetella pertussis NOT DETECTED NOT DETECTED Final   Chlamydophila pneumoniae NOT DETECTED NOT DETECTED Final   Mycoplasma pneumoniae NOT DETECTED NOT DETECTED Final  MRSA PCR Screening     Status: None   Collection Time: 11/11/16  6:42 AM  Result Value Ref Range Status   MRSA by PCR NEGATIVE NEGATIVE Final    Comment:         The GeneXpert MRSA Assay (FDA approved for NASAL specimens only), is one component of a comprehensive MRSA colonization surveillance program. It is not intended to diagnose MRSA infection nor to guide or monitor treatment for MRSA infections.   Culture, respiratory (NON-Expectorated)     Status: None   Collection Time: 11/11/16  8:00 AM  Result Value Ref Range Status   Specimen Description TRACHEAL ASPIRATE  Final   Special Requests Immunocompromised  Final   Gram Stain   Final    ABUNDANT WBC PRESENT,BOTH PMN AND MONONUCLEAR RARE SQUAMOUS EPITHELIAL CELLS PRESENT FEW GRAM POSITIVE COCCI IN PAIRS RARE GRAM NEGATIVE COCCI IN PAIRS RARE GRAM POSITIVE RODS    Culture Consistent with normal respiratory flora.  Final   Report Status 11/13/2016 FINAL  Final     Labs: BNP (last 3 results)  Recent Labs  11/24/15 0154 01/18/16 0236 11/11/16 0316  BNP 211.9* 41.0 02.5   Basic Metabolic Panel:  Recent Labs Lab 11/13/16 0328 11/14/16 0631 11/15/16 0534 11/16/16 0229 11/17/16 0456 11/18/16 0504  NA 133* 135 135 134* 130* 135  K 4.1 3.4* 3.3* 3.6 5.1 4.2  CL 93* 94* 96* 94* 95* 95*  CO2 30 33* '29 31 28 '$ 33*  GLUCOSE 97 133* 94 97 121* 154*  BUN 22* 31* 28* '19 16 11  '$ CREATININE 0.75 0.81 0.75 0.63 0.78 0.77  CALCIUM 9.0 9.2 9.1 8.7* 8.7* 9.1  MG 2.0 2.2 2.0 2.1 2.3  --   PHOS 4.4 4.2 2.5 2.2*  --   --    Liver Function Tests: No results for input(s): AST, ALT, ALKPHOS, BILITOT, PROT, ALBUMIN in the last 168 hours. No results for input(s): LIPASE, AMYLASE in the last 168 hours. No results for input(s): AMMONIA in the last 168 hours. CBC:  Recent Labs Lab 11/14/16 0631 11/15/16 0534 11/16/16 0229 11/17/16 0456 11/18/16 0504  WBC 11.4* 15.5* 14.4* 11.8* 8.1  NEUTROABS  --   --   --  11.4*  --   HGB 12.5* 12.1* 11.7* 12.1* 12.3*  HCT 39.3 38.4* 37.0* 38.9* 37.8*  MCV 89.5 89.9 90.5 92.2 88.3  PLT 88* 104* 114* 121* 127*   Cardiac Enzymes: No results for  input(s): CKTOTAL, CKMB, CKMBINDEX, TROPONINI in the last 168 hours. BNP: Invalid input(s): POCBNP CBG:  Recent Labs Lab 11/18/16 1651 11/18/16 2030 11/19/16 0059 11/19/16 0450 11/19/16 0751  GLUCAP 115* 135* 193* 144* 111*   D-Dimer No results for input(s): DDIMER in the last 72 hours. Hgb A1c No results for input(s): HGBA1C in the last 72 hours. Lipid Profile No results for input(s): CHOL, HDL, LDLCALC, TRIG, CHOLHDL, LDLDIRECT in the last 72 hours. Thyroid function studies No results for input(s): TSH, T4TOTAL, T3FREE, THYROIDAB in the last 72 hours.  Invalid input(s): FREET3 Anemia work up No results for input(s): VITAMINB12, FOLATE, FERRITIN, TIBC, IRON, RETICCTPCT in the last 72 hours. Urinalysis    Component Value Date/Time   COLORURINE AMBER (A) 05/18/2016 1310   APPEARANCEUR CLEAR 05/18/2016 1310   LABSPEC 1.019 05/18/2016 1310   PHURINE 7.0 05/18/2016 1310   GLUCOSEU NEGATIVE 05/18/2016 1310   GLUCOSEU NEGATIVE 10/05/2015 0958   HGBUR NEGATIVE 05/18/2016 1310   BILIRUBINUR SMALL (A) 05/18/2016 1310   BILIRUBINUR n 08/14/2011 1449   KETONESUR 15 (A) 05/18/2016 1310   PROTEINUR Negative 10/22/2016 1137   UROBILINOGEN 0.2 10/05/2015 0958   NITRITE NEGATIVE 05/18/2016 1310   LEUKOCYTESUR NEGATIVE 05/18/2016 1310   Sepsis Labs Invalid input(s): PROCALCITONIN,  WBC,  LACTICIDVEN Microbiology Recent Results (from the past 240 hour(s))  Culture, blood (Routine X 2) w Reflex to ID Panel     Status: None   Collection Time: 11/11/16  4:52 AM  Result Value Ref Range Status   Specimen Description BLOOD RIGHT ANTECUBITAL  Final   Special Requests BOTTLES DRAWN AEROBIC AND ANAEROBIC 5CC EA  Final   Culture NO GROWTH 5 DAYS  Final   Report Status 11/16/2016 FINAL  Final  Culture, blood (Routine X 2) w Reflex to ID Panel     Status: None   Collection Time: 11/11/16  5:30 AM  Result Value Ref Range Status   Specimen Description BLOOD RIGHT HAND  Final   Special  Requests IN PEDIATRIC BOTTLE 2CC  Final   Culture NO GROWTH 5 DAYS  Final   Report Status 11/16/2016 FINAL  Final  Respiratory Panel by PCR     Status: None   Collection Time: 11/11/16  5:34 AM  Result Value Ref Range Status   Adenovirus NOT DETECTED NOT DETECTED Final   Coronavirus 229E NOT DETECTED NOT DETECTED Final   Coronavirus HKU1 NOT DETECTED NOT DETECTED Final   Coronavirus NL63 NOT DETECTED NOT DETECTED Final   Coronavirus OC43 NOT DETECTED NOT DETECTED Final   Metapneumovirus NOT DETECTED NOT DETECTED Final   Rhinovirus / Enterovirus NOT DETECTED NOT DETECTED Final   Influenza A NOT DETECTED NOT DETECTED Final   Influenza B NOT DETECTED NOT DETECTED Final   Parainfluenza Virus 1 NOT DETECTED NOT DETECTED Final   Parainfluenza Virus 2 NOT DETECTED NOT DETECTED Final   Parainfluenza Virus 3 NOT DETECTED NOT DETECTED Final   Parainfluenza Virus 4 NOT DETECTED NOT DETECTED Final   Respiratory Syncytial Virus NOT DETECTED NOT DETECTED Final   Bordetella pertussis NOT DETECTED NOT DETECTED Final   Chlamydophila pneumoniae NOT DETECTED NOT DETECTED Final   Mycoplasma pneumoniae NOT DETECTED NOT DETECTED Final  MRSA PCR Screening     Status: None   Collection Time: 11/11/16  6:42 AM  Result Value Ref Range Status   MRSA by PCR NEGATIVE NEGATIVE Final    Comment:        The GeneXpert MRSA Assay (FDA approved for NASAL specimens only), is one component of a comprehensive MRSA colonization surveillance program. It is not intended to diagnose MRSA infection nor to guide or monitor treatment for MRSA infections.   Culture, respiratory (NON-Expectorated)     Status: None   Collection Time: 11/11/16  8:00 AM  Result Value Ref Range Status   Specimen Description TRACHEAL ASPIRATE  Final   Special Requests Immunocompromised  Final   Gram Stain   Final    ABUNDANT WBC PRESENT,BOTH PMN AND MONONUCLEAR RARE SQUAMOUS EPITHELIAL CELLS PRESENT FEW GRAM POSITIVE COCCI IN PAIRS RARE  GRAM NEGATIVE COCCI IN PAIRS RARE GRAM POSITIVE RODS    Culture Consistent with normal respiratory flora.  Final   Report Status 11/13/2016 FINAL  Final     Time coordinating discharge: Over 30 minutes  SIGNED:   Birdie Hopes, MD  [facility] 11/19/2016, 10:16 AM Pager   If 7PM-7AM, please contact night-coverage [email or web address] Password [email or web address]

## 2016-11-19 NOTE — Progress Notes (Signed)
Speech Language Pathology Treatment: Dysphagia  Patient Details Name: Ernest [name] MRN: [id number] DOB: January 14, 1956 Today's Date: 11/19/2016 Time: 0820-0828 SLP Time Calculation (min) (ACUTE ONLY): 8 min  Assessment / Plan / Recommendation Clinical Impression  Pt tolerated solid and liquids without signs of aspriation or report of difficulty. Nausea has resolved. Pt described oral care routine, 3 x a day, cleaning dentures and mucosa. Pt is independent, no SLP f/u needed.    HPI        SLP Plan  All goals met;Discharge SLP treatment due to (comment)     Recommendations  Diet recommendations: Regular;Thin liquid Liquids provided via: Cup;Straw Medication Administration: Whole meds with liquid Supervision: Patient able to self feed Compensations: Slow rate;Small sips/bites Postural Changes and/or Swallow Maneuvers: Seated upright 90 degrees                Plan: All goals met;Discharge SLP treatment due to (comment)       GO                Jannessa Ogden, Katherene Ponto 11/19/2016, 9:04 AM

## 2016-11-19 NOTE — Progress Notes (Signed)
Ernest [name] discharged Home per MD order.  Discharge instructions reviewed and discussed with the patient, all questions and concerns answered. Copy of instructions and care notes given to patient.  Allergies as of 11/19/2016      Reactions   Carboplatin Shortness Of Breath, Swelling, Rash   Swelling of lips, rash on face,eyes and head      Medication List    STOP taking these medications   levofloxacin 500 MG tablet Commonly known as:  LEVAQUIN     TAKE these medications   ADVAIR DISKUS 500-50 MCG/DOSE Aepb Generic drug:  Fluticasone-Salmeterol 1 PUFF THEN RINSE MOUTH, TWICE DAILY MAINTENANCE   ALPRAZolam 0.5 MG tablet Commonly known as:  XANAX Take 0.5-1 mg by mouth every 4 (four) hours as needed (for breathing-related anxiety).   amoxicillin-clavulanate 875-125 MG tablet Commonly known as:  AUGMENTIN Take 1 tablet by mouth 2 (two) times daily.   arformoterol 15 MCG/2ML Nebu Commonly known as:  BROVANA Take 2 mLs (15 mcg total) by nebulization 2 (two) times daily. What changed:  Another medication with the same name was removed. Continue taking this medication, and follow the directions you see here.   ARIPiprazole 2 MG tablet Commonly known as:  ABILIFY Take 2 mg by mouth every morning.   Armodafinil 250 MG tablet Take 250 mg by mouth every morning.   aspirin 81 MG EC tablet TAKE 1 TABLET BY MOUTH EVERY DAY   atorvastatin 20 MG tablet Commonly known as:  LIPITOR TAKE 1 TABLET EVERY DAY What changed:  See the new instructions.   ciclopirox 0.77 % cream Commonly known as:  LOPROX Apply topically 2 (two) times daily.   clonazePAM 1 MG tablet Commonly known as:  KLONOPIN Take 2 mg by mouth at bedtime as needed for anxiety (or sleep). Reported on 01/16/2016   DALIRESP 500 MCG Tabs tablet Generic drug:  roflumilast Take 500 mcg by mouth daily.   dexamethasone 4 MG tablet Commonly known as:  DECADRON 2 tablets by mouth twice a day the day before, day  of and day after the chemotherapy every 3 weeks   dextromethorphan-guaiFENesin 30-600 MG 12hr tablet Commonly known as:  MUCINEX DM Take 1 tablet by mouth 2 (two) times daily as needed for cough (congestion).   esomeprazole 40 MG capsule Commonly known as:  NEXIUM TAKE ONE CAPSULE BY MOUTH EVERY DAY What changed:  See the new instructions.   feeding supplement (ENSURE ENLIVE) Liqd Take 237 mLs by mouth 2 (two) times daily between meals.   fluconazole 100 MG tablet Commonly known as:  DIFLUCAN Take 1 tablet (100 mg total) by mouth daily.   flunisolide 25 MCG/ACT (0.025%) Soln Commonly known as:  NASALIDE Place 1 spray into the nose daily as needed for allergies.   FLUoxetine 40 MG capsule Commonly known as:  PROZAC Take 40 mg by mouth every morning.   Gabapentin Enacarbil ER 300 MG Tbcr Commonly known as:  HORIZANT Take 1 capsule by mouth daily.   glipiZIDE 5 MG tablet Commonly known as:  GLUCOTROL Take 2.5 mg by mouth daily.   indomethacin 25 MG capsule Commonly known as:  INDOCIN Take 1 tablet three times daily with meals x 3 days, then increase to 2 tablet three times daily x 3 days. What changed:  additional instructions   KLOR-CON M20 20 MEQ tablet Generic drug:  potassium chloride SA TAKE 1 TABLET BY MOUTH EVERY MORNING   LORazepam 0.5 MG tablet Commonly known as:  ATIVAN Take 1  tablet (0.5 mg total) by mouth 2 (two) times daily as needed for anxiety. Do not take when taking Klonopin.   magic mouthwash Soln Use as directed 5 mLs in the mouth or throat 4 (four) times daily as needed.   Methylnaltrexone Bromide 150 MG Tabs Commonly known as:  RELISTOR Take 3 tablets by mouth every morning.   metoprolol succinate 25 MG 24 hr tablet Commonly known as:  TOPROL-XL Take 12.5 mg by mouth daily. What changed:  Another medication with the same name was added. Make sure you understand how and when to take each.   metoprolol succinate 25 MG 24 hr tablet Commonly  known as:  TOPROL-XL TAKE 1/2 TABLET BY MOUTH DAILY What changed:  You were already taking a medication with the same name, and this prescription was added. Make sure you understand how and when to take each.   mirtazapine 45 MG tablet Commonly known as:  REMERON Take 1 tablet (45 mg total) by mouth at bedtime.   morphine 30 MG tablet Commonly known as:  MSIR Take 30 mg by mouth every 12 (twelve) hours.   morphine 30 MG 12 hr tablet Commonly known as:  MS CONTIN Take 1 tablet (30 mg total) by mouth every 12 (twelve) hours.   morphine 15 MG tablet Commonly known as:  MSIR Take 15 mg by mouth every 4 (four) hours as needed for severe pain.   nitroGLYCERIN 0.4 MG SL tablet Commonly known as:  NITROSTAT Place 1 tablet (0.4 mg total) under the tongue every 5 (five) minutes as needed for chest pain.   ondansetron 8 MG tablet Commonly known as:  ZOFRAN TAKE 1 TABLET (8 MG TOTAL) BY MOUTH EVERY 8 (EIGHT) HOURS AS NEEDED FOR NAUSEA OR VOMITING.   OXYGEN Inhale 2 L into the lungs daily.   pantoprazole 40 MG tablet Commonly known as:  PROTONIX Take 40 mg by mouth daily.   predniSONE 10 MG tablet Commonly known as:  DELTASONE TAKE 4 X 2 DAYS, 3 X 2 DAYS, 2 X 2 DAYS, 1 X 2 DAYS THEN BACK TO MAINTENANCE DOSE   predniSONE 10 MG tablet Commonly known as:  DELTASONE TAKE 4 X 2 DAYS, 3 X 2 DAYS, 2 X 2 DAYS, 1 X 2 DAYS THEN BACK TO MAINTENANCE DOSE   predniSONE 5 MG tablet Commonly known as:  DELTASONE Take 5 mg by mouth daily.   prochlorperazine 10 MG tablet Commonly known as:  COMPAZINE Take 1 tablet (10 mg total) by mouth every 6 (six) hours as needed for nausea or vomiting.   tamsulosin 0.4 MG Caps capsule Commonly known as:  FLOMAX Take 0.4 mg by mouth daily.   theophylline 400 MG 24 hr tablet Commonly known as:  UNIPHYL Take 1 tablet (400 mg total) by mouth daily.   Tiotropium Bromide Monohydrate 1.25 MCG/ACT Aers Commonly known as:  SPIRIVA RESPIMAT Inhale 2 puffs  into the lungs daily.   topiramate 25 MG tablet Commonly known as:  TOPAMAX Take 25 mg by mouth daily.   traZODone 150 MG tablet Commonly known as:  DESYREL Take 150 mg by mouth at bedtime.   VENTOLIN HFA 108 (90 Base) MCG/ACT inhaler Generic drug:  albuterol INHALE 2 PUFFS INTO THE LUNGS 4 TIMES DAILY AS NEEDED FOR WHEEZING What changed:  Another medication with the same name was removed. Continue taking this medication, and follow the directions you see here.   albuterol (2.5 MG/3ML) 0.083% nebulizer solution Commonly known as:  PROVENTIL INHALE 1  VIAL IN NEBULIZER EVERY 4 HOURS AS NEEDED FOR WHEEZING OR SHORTNESS OF BREATH What changed:  Another medication with the same name was removed. Continue taking this medication, and follow the directions you see here.            Durable Medical Equipment        Start     Ordered   11/19/16 1230  For home use only DME 4 wheeled rolling walker with seat  Once    Question:  Patient needs a walker to treat with the following condition  Answer:  Weakness   11/19/16 1230      Patients skin is clean, dry and intact, no evidence of skin break down. IV site discontinued and catheter remains intact. Site without signs and symptoms of complications. Dressing and pressure applied.  Patient escorted to car by NT in a wheelchair,  no distress noted upon discharge.  Wynetta Emery, Rozelia Catapano C 11/19/2016 1:48 PM

## 2016-11-19 NOTE — Progress Notes (Signed)
Physical Therapy Treatment Patient Details Name: Ernest [name] MRN: [id number] DOB: [birth year] Today's Date: 11/19/2016    History of Present Illness 60 y.o. 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      PT Comments    Pt performed gait and stair training in prep for d/c home.  Will recommend rollator for improved energy conservation at d/c.  Informed case manager, will inform supervising PT of recommendation for equipment.     Follow Up Recommendations  Home health PT;Supervision for mobility/OOB     Equipment Recommendations  Other (comment) (rollator (4 wheeled walker with seat))    Recommendations for Other Services       Precautions / Restrictions Precautions Precautions: Fall Precaution Comments: watch o2 sats Restrictions Weight Bearing Restrictions: No    Mobility  Bed Mobility               General bed mobility comments: Pt in recliner on arrival.    Transfers Overall transfer level: Modified independent Equipment used: Rolling walker (2 wheeled) Transfers: Sit to/from Stand Sit to Stand: Modified independent (Device/Increase time)         General transfer comment: Good technique.  Ambulation/Gait Ambulation/Gait assistance: Min guard Ambulation Distance (Feet): 120 Feet Assistive device: Rolling walker (2 wheeled) Gait Pattern/deviations: Step-through pattern;Decreased step length - right;Decreased step length - left;Trunk flexed Gait velocity: decreased Gait velocity interpretation: Below normal speed for age/gender General Gait Details: Cues for pacing and posture.     Stairs Stairs: Yes   Stair Management: Two rails;Forwards Number of Stairs: 2 General stair comments: Cues for sequencing.    Wheelchair Mobility    Modified Rankin (Stroke Patients Only)       Balance  Overall balance assessment: Needs assistance Sitting-balance support: No upper extremity supported;Feet supported Sitting balance-Leahy Scale: Good Sitting balance - Comments: sitting in recliner   Standing balance support: No upper extremity supported;During functional activity Standing balance-Leahy Scale: Fair Standing balance comment: sink level grooming with no UE support                    Cognition Arousal/Alertness: Awake/alert Behavior During Therapy: WFL for tasks assessed/performed Overall Cognitive Status: Within Functional Limits for tasks assessed                      Exercises      General Comments General comments (skin integrity, edema, etc.): wife in room for entire session      Pertinent Vitals/Pain Pain Assessment: No/denies pain    Home Living Family/patient expects to be discharged to:: Private residence Living Arrangements: Spouse/significant other Available Help at Discharge: Family;Available PRN/intermittently Type of Home: House Home Access: Stairs to enter Entrance Stairs-Rails: Right;Left;Can reach both Home Layout: One level Home Equipment: Environmental consultant - 2 wheels;Cane - single point;Bedside commode;Shower seat;Hand held shower head;Other (comment) (bed that is adjustable)      Prior Function Level of Independence: Independent      Comments: occasional use of cane   PT Goals (current goals can now be found in the care plan section) Acute Rehab PT Goals Patient Stated Goal: to go home Potential to Achieve Goals: Good Progress towards PT goals: Progressing toward goals    Frequency    Min 3X/week      PT Plan Discharge plan needs to be updated  Co-evaluation             End of Session Equipment Utilized During Treatment: Gait belt;Oxygen (3L) Activity Tolerance: Patient limited by fatigue Patient left: in chair;with call bell/phone within reach     Time: 1201-1219 PT Time Calculation (min) (ACUTE ONLY): 18  min  Charges:  $Gait Training: 8-22 mins                    G Codes:      Cristela Blue November 27, 2016, 12:37 PM Governor Rooks, PTA pager [phone]

## 2016-11-19 NOTE — Evaluation (Signed)
Occupational Therapy Evaluation and Discharge Patient Details Name: EMELIO SCHNELLER MRN: [id number] DOB: 15-Sep-1956 Today's Date: 11/19/2016    History of Present Illness 60 y.o. 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     Clinical Impression   Pt at baseline according to Pt and wife. Pt able to demonstrate supervision level ADL and ambulation with RW. Use of 3 L 02 during session, and 02 levels stayed above 94 the entire session (checked sitting in recliner, and 2x while standing at the sink during grooming). No further questions or concerns from OT perspective from Pt or wife. OT to sign off. Thank you for this referral.     Follow Up Recommendations  No OT follow up;Supervision - Intermittent    Equipment Recommendations  Other (comment) (Recommended long handle sponge for shower)    Recommendations for Other Services       Precautions / Restrictions Precautions Precautions: Fall Precaution Comments: watch o2 sats Restrictions Weight Bearing Restrictions: No      Mobility Bed Mobility               General bed mobility comments: Pt sitting OOB in recliner when OT entered the room.  Transfers Overall transfer level: Modified independent Equipment used: Rolling walker (2 wheeled) Transfers: Sit to/from Stand Sit to Stand: Supervision              Balance Overall balance assessment: Needs assistance Sitting-balance support: No upper extremity supported;Feet supported Sitting balance-Leahy Scale: Good Sitting balance - Comments: sitting in recliner   Standing balance support: No upper extremity supported;During functional activity Standing balance-Leahy Scale: Fair Standing balance comment: sink level grooming with no UE support                            ADL Overall ADL's : At  baseline                                       General ADL Comments: Pt ambulated to sink to perform sink level grooming. Pt performed oral care, washing face, and then seated dressing tasks. Pt and wife feel like he is at baseline. Reviewed safety for the tub, and provided energy conservation handout. Pt stated several times that he is glad to be going home. OT asked what he was going to change so he didn't have to end up in the hospital again, and he said "avoid the cold weather, walk in [employer] or [employer] more, and stop smoking." Pt has good support in his wife.     Vision Vision Assessment?: No apparent visual deficits   Perception     Praxis      Pertinent Vitals/Pain Pain Assessment: No/denies pain     Hand Dominance Right   Extremity/Trunk Assessment Upper Extremity Assessment Upper Extremity Assessment: Overall WFL for tasks assessed   Lower Extremity Assessment Lower Extremity Assessment: Overall WFL for tasks assessed   Cervical / Trunk Assessment Cervical / Trunk Assessment: Kyphotic   Communication Communication Communication: No difficulties   Cognition Arousal/Alertness: Awake/alert Behavior During Therapy: WFL for tasks assessed/performed Overall Cognitive Status: Within Functional Limits for tasks assessed  General Comments       Exercises       Shoulder Instructions      Home Living Family/patient expects to be discharged to:: Private residence Living Arrangements: Spouse/significant other Available Help at Discharge: Family;Available PRN/intermittently Type of Home: House Home Access: Stairs to enter [employer] of Steps: 2 Entrance Stairs-Rails: Right;Left;Can reach both Home Layout: One level     Bathroom Shower/Tub: Tub/shower unit;Walk-in shower Shower/tub characteristics: Architectural technologist: Standard Bathroom Accessibility: Yes How Accessible: Accessible via walker Home  Equipment: [place] - 2 wheels;Cane - single point;Bedside commode;Shower seat;Hand held shower head;Other (comment) (bed that is adjustable)          Prior Functioning/Environment Level of Independence: Independent        Comments: occasional use of cane        OT Problem List: Decreased activity tolerance;Decreased knowledge of use of DME or AE   OT Treatment/Interventions:      OT Goals(Current goals can be found in the care plan section) Acute Rehab OT Goals Patient Stated Goal: to go home OT Goal Formulation: With patient/family Time For Goal Achievement: 11/26/16 Potential to Achieve Goals: Good  OT Frequency:     Barriers to D/C:            Co-evaluation              End of Session Equipment Utilized During Treatment: Rolling walker;Oxygen (3L) Nurse Communication: Mobility status  Activity Tolerance: Patient tolerated treatment well Patient left: in chair;with call bell/phone within reach;with family/visitor present   Time: 5486-2824 OT Time Calculation (min): 42 min Charges:  OT General Charges $OT Visit: 1 Procedure OT Evaluation $OT Eval Low Complexity: 1 Procedure OT Treatments $Self Care/Home Management : 23-37 mins G-Codes:    Merri Ray Kyleeann Cremeans 12-11-2016, 11:58 AM Hulda Humphrey OTR/L [phone]

## 2016-11-19 NOTE — Care Management Note (Signed)
Case Management Note  Patient Details  Name: Ernest [name] MRN: [id number] Date of Birth: Mar 24, 1956  Subjective/Objective:               Patient admitted with hypoxia, hx lung CA. Patient from home with wife. Follows with [facility] and [facility] for palliative support. Home O2 provided through [place]. Cm spoke with patient, he would like to use [facility] for [facility] PT OT RN. Referral made to [facility] clinical liaison [facility].     Action/Plan:  Anticipate DC to home with [facility] today.   Expected Discharge Date:                  Expected Discharge Plan:  [place]  In-House Referral:  Hospice / Palliative Care  Discharge planning Services  CM Consult  Post Acute Care Choice:  Home Health Choice offered to:  Patient  DME Arranged:    DME Agency:     HH Arranged:  RN, PT, OT HH Agency:  [facility]  Status of Service:  Completed, signed off  If discussed at [place] of Stay Meetings, dates discussed:    Additional Comments:  Carles Collet, RN 11/19/2016, 12:02 PM

## 2016-11-19 NOTE — Telephone Encounter (Signed)
lmtcb for pt/wife to make aware of med change. Will send in duoneb after speaking to pt.

## 2016-11-20 MED ORDER — IPRATROPIUM-ALBUTEROL 0.5-2.5 (3) MG/3ML IN SOLN: 3.0000 mL | Freq: Four times a day (QID) | RESPIRATORY_TRACT | 11 refills | Status: DC | PRN

## 2016-11-20 NOTE — Telephone Encounter (Signed)
Spoke with pt, aware of med change.  rx sent to preferred pharmacy.  Nothing further needed.

## 2016-11-21 ENCOUNTER — Telehealth: Payer: Self-pay | Admitting: *Deleted

## 2016-11-21 DIAGNOSIS — F3341 Major depressive disorder, recurrent, in partial remission: Secondary | ICD-10-CM | POA: Diagnosis not present

## 2016-11-21 NOTE — Progress Notes (Signed)
Covering for my partner Dr Hartford Poli. Patient was under his care and discharged home on 12/26. Patient was discharged on  prednisone taper. Somehow did not get it filled. Called pharmacy to fill in prednisone taper as mentioned in AVS.  (40 mg daily for 2 days, then 30 mg daily for 2 days, then 20 mg daily for next 2 days, then 10 mg daily for next 2 days and then back to his home maintenance dose of 5 mg daily)

## 2016-11-21 NOTE — Telephone Encounter (Signed)
LMOVM for Jarrett Soho at [place] regarding disability paperwork.

## 2016-11-23 DIAGNOSIS — C342 Malignant neoplasm of middle lobe, bronchus or lung: Secondary | ICD-10-CM | POA: Diagnosis not present

## 2016-11-23 DIAGNOSIS — J449 Chronic obstructive pulmonary disease, unspecified: Secondary | ICD-10-CM | POA: Diagnosis not present

## 2016-11-23 DIAGNOSIS — J9601 Acute respiratory failure with hypoxia: Secondary | ICD-10-CM | POA: Diagnosis not present

## 2016-11-26 ENCOUNTER — Encounter: Payer: Self-pay | Admitting: Internal Medicine

## 2016-11-26 ENCOUNTER — Telehealth: Payer: Self-pay | Admitting: Internal Medicine

## 2016-11-26 ENCOUNTER — Other Ambulatory Visit (HOSPITAL_BASED_OUTPATIENT_CLINIC_OR_DEPARTMENT_OTHER): Payer: Commercial Managed Care - HMO

## 2016-11-26 ENCOUNTER — Ambulatory Visit: Payer: Commercial Managed Care - HMO

## 2016-11-26 ENCOUNTER — Ambulatory Visit (HOSPITAL_BASED_OUTPATIENT_CLINIC_OR_DEPARTMENT_OTHER): Payer: Commercial Managed Care - HMO | Admitting: Internal Medicine

## 2016-11-26 VITALS — BP 170/77 | HR 116 | Temp 98.3°F | Resp 18 | Wt 161.1 lb

## 2016-11-26 DIAGNOSIS — Z7189 Other specified counseling: Secondary | ICD-10-CM

## 2016-11-26 DIAGNOSIS — J449 Chronic obstructive pulmonary disease, unspecified: Secondary | ICD-10-CM

## 2016-11-26 DIAGNOSIS — F329 Major depressive disorder, single episode, unspecified: Secondary | ICD-10-CM

## 2016-11-26 DIAGNOSIS — C3491 Malignant neoplasm of unspecified part of right bronchus or lung: Secondary | ICD-10-CM

## 2016-11-26 DIAGNOSIS — I10 Essential (primary) hypertension: Secondary | ICD-10-CM | POA: Diagnosis not present

## 2016-11-26 DIAGNOSIS — J9611 Chronic respiratory failure with hypoxia: Secondary | ICD-10-CM

## 2016-11-26 DIAGNOSIS — Z5111 Encounter for antineoplastic chemotherapy: Secondary | ICD-10-CM

## 2016-11-26 LAB — COMPREHENSIVE METABOLIC PANEL
ALBUMIN: 3.1 g/dL — AB (ref 3.5–5.0)
ALK PHOS: 88 U/L (ref 40–150)
ALT: 20 U/L (ref 0–55)
AST: 18 U/L (ref 5–34)
Anion Gap: 12 mEq/L — ABNORMAL HIGH (ref 3–11)
BUN: 14 mg/dL (ref 7.0–26.0)
CALCIUM: 9.7 mg/dL (ref 8.4–10.4)
CO2: 27 mEq/L (ref 22–29)
Chloride: 100 mEq/L (ref 98–109)
Creatinine: 0.9 mg/dL (ref 0.7–1.3)
EGFR: >90 mL/min/{1.73_m2} (ref >90)
Glucose: 98 mg/dl (ref 70–140)
POTASSIUM: 4 meq/L (ref 3.5–5.1)
Sodium: 140 mEq/L (ref 136–145)
Total Bilirubin: 0.48 mg/dL (ref 0.20–1.20)
Total Protein: 6.2 g/dL — ABNORMAL LOW (ref 6.4–8.3)

## 2016-11-26 LAB — CBC WITH DIFFERENTIAL/PLATELET
BASO%: 0.5 % (ref 0.0–2.0)
BASOS ABS: 0.1 10*3/uL (ref 0.0–0.1)
EOS ABS: 0.1 10*3/uL (ref 0.0–0.5)
EOS%: 0.7 % (ref 0.0–7.0)
HEMATOCRIT: 38.1 % — AB (ref 38.4–49.9)
HEMOGLOBIN: 12.3 g/dL — AB (ref 13.0–17.1)
LYMPH#: 0.6 10*3/uL — AB (ref 0.9–3.3)
LYMPH%: 3.5 % — ABNORMAL LOW (ref 14.0–49.0)
MCH: 29 pg (ref 27.2–33.4)
MCHC: 32.3 g/dL (ref 32.0–36.0)
MCV: 89.8 fL (ref 79.3–98.0)
MONO#: 1 10*3/uL — AB (ref 0.1–0.9)
MONO%: 5.8 % (ref 0.0–14.0)
NEUT#: 15.7 10*3/uL — ABNORMAL HIGH (ref 1.5–6.5)
NEUT%: 89.5 % — ABNORMAL HIGH (ref 39.0–75.0)
PLATELETS: 134 10*3/uL — AB (ref 140–400)
RBC: 4.24 10*6/uL (ref 4.20–5.82)
RDW: 17.9 % — AB (ref 11.0–14.6)
WBC: 17.5 10*3/uL — ABNORMAL HIGH (ref 4.0–10.3)

## 2016-11-26 NOTE — Telephone Encounter (Signed)
Appointments scheduled per 1/2 LOS. Patient given AVS report and calendars with future scheduled appointments. °

## 2016-11-26 NOTE — Progress Notes (Signed)
[place] [phone]   [phone]  OFFICE PROGRESS NOTE  Ernest Calico, MD [address] [facility] [address] [place] [address]  DIAGNOSIS: Metastatic non-small cell lung cancer, poorly differentiated carcinoma initially diagnosed as a stage IIIa (T3, N2, M0) T squamous cell carcinoma involving the left supraclavicular mass with mediastinal lymphadenopathy diagnosed in November 2013. The patient had recurrence in February 2017. PDL1 expression 0%.  PRIOR THERAPY: 1) Concurrent chemoradiation with weekly carboplatin for AUC of 2 and paclitaxel 45 mg/M2, status post 7 cycles, last dose was given 12/20/2013. First dose on 11/01/2013. 2) Consolidation chemotherapy with carboplatin for AUC of 5 and paclitaxel 175 mg/M2 every 3 weeks with Neulasta support. First cycle on 02/08/2014. Status post 3 cycles and carboplatin was discontinued secondary to allergic reaction. 3) Abraxane 100 MG/M2 on days 1 and 8 every 3 weeks. Status post 3 cycles. Last dose was given 04/16/2016 discontinued secondary to disease progression. 4) Nivolumab 240 MG IV every 2 weeks. First dose 05/27/2016. Status post one cycle. Discontinued secondary to disease progression. 5) systemic chemotherapy with docetaxel 75 MG/M2 110 MG/KG every 3 weeks status post 3 cycles. First cycle was given on 09/24/2016. This was discontinued today secondary to disease progression.  CURRENT THERAPY: Gemcitabine 1000 MG/M2 on days 1 and 8 every 3 weeks. First dose 12/03/2016.  INTERVAL HISTORY: Ernest [name] 61 y.o. male returns to the clinic today for follow-up visit accompanied by his wife. The patient continues to complain of shortness of breath at baseline and increased with exertion. He was admitted recently to the hospital with postobstructive pneumonia. During his hospitalization he underwent imaging studies with CT angiogram of the chest, abdomen and pelvis. His scan was concerning for disease  progression. He denied having any nausea, vomiting, diarrhea or constipation. He has no fever or chills. He lost around 11 pounds since his last visit. He is here today for evaluation and discussion of his treatment options.  MEDICAL HISTORY: Past Medical History:  Diagnosis Date  . Acute bronchitis 11/05/2016  . Anxiety   . CAD (coronary artery disease)    Left Main 30% stenosis, LAD 20 - 30 % stenosis, first and second diagonal branchesat 40 - 50%  stenosis with small arteries, circumflex had 30% stenosis in the large obtuse marginal, RCA at 70 - 80%  stenosis [not felt to be occlusive after evaluation with flow wire], distal 50 - 60% stenosis - [name][  . Chronic insomnia   . COPD with asthma ([place]) 09/08/2007  . DDD (degenerative disc disease)   . Depression   . Diabetes mellitus without complication ([place]) 7/79/3903   no medications now 07/24/2016  . GERD (gastroesophageal reflux disease)   . Gout   . History of cardiovascular stress test    Myoview 7/16:  Diaphragmatic attenuation, no ischemia, EF 56%; Low Risk  . History of radiation therapy 11/10/13- 12/29/13   left lung 6600 cGy in 33 sessions  . Hx of colonoscopy   . Hyperlipidemia   . Hypertension    dr Percival Spanish  . Itching due to drug 03/19/2016  . Lung cancer ([place]) 10/04/13   LUL squamous cell lung cancer  . Lung cancer ([facility] [facility])   . Nausea with vomiting 03/26/2016  . OSA (obstructive sleep apnea)    NPSG 09/10/10- AHI 11.3/hr  . PVD (peripheral vascular disease) ([facility])    PTA/Stent right common iliac    ALLERGIES:  is allergic to carboplatin.  MEDICATIONS:  Current  Outpatient Prescriptions  Medication Sig Dispense Refill  . ADVAIR DISKUS 500-50 MCG/DOSE AEPB 1 PUFF THEN RINSE MOUTH, TWICE DAILY MAINTENANCE 60 each 2  . albuterol (PROVENTIL) (2.5 MG/3ML) 0.083% nebulizer solution INHALE 1 VIAL IN NEBULIZER EVERY 4 HOURS AS NEEDED FOR WHEEZING OR SHORTNESS OF BREATH 300 mL 2  . ALPRAZolam (XANAX) 0.5 MG tablet Take  0.5-1 mg by mouth every 4 (four) hours as needed (for breathing-related anxiety).    [name] arformoterol (BROVANA) 15 MCG/2ML NEBU Take 2 mLs (15 mcg total) by nebulization 2 (two) times daily. 120 mL 6  . ARIPiprazole (ABILIFY) 5 MG tablet Take 5 mg by mouth every morning.  5  . Armodafinil 250 MG tablet Take 250 mg by mouth every morning.  5  . aspirin 81 MG EC tablet TAKE 1 TABLET BY MOUTH EVERY DAY 30 tablet 5  . atorvastatin (LIPITOR) 20 MG tablet TAKE 1 TABLET EVERY DAY (Patient taking differently: TAKE 20 MG  EVERY DAY IN THE EVENING) 90 tablet 3  . ciclopirox (LOPROX) 0.77 % cream Apply topically 2 (two) times daily. 90 g 3  . clonazePAM (KLONOPIN) 1 MG tablet Take 2 mg by mouth at bedtime as needed for anxiety (or sleep). Reported on 01/16/2016    . dexamethasone (DECADRON) 4 MG tablet 2 tablets by mouth twice a day the day before, day of and day after the chemotherapy every 3 weeks 40 tablet 1  . dextromethorphan-guaiFENesin (MUCINEX DM) 30-600 MG 12hr tablet Take 1 tablet by mouth 2 (two) times daily as needed for cough (congestion).    [name] esomeprazole (NEXIUM) 40 MG capsule TAKE ONE CAPSULE BY MOUTH EVERY DAY (Patient taking differently: TAKE 40 MG  BY MOUTH TWICE DAILY.) 90 capsule 3  . feeding supplement, ENSURE ENLIVE, (ENSURE ENLIVE) LIQD Take 237 mLs by mouth 2 (two) times daily between meals. 237 mL 12  . fluconazole (DIFLUCAN) 100 MG tablet Take 1 tablet (100 mg total) by mouth daily. 21 tablet 0  . flunisolide (NASALIDE) 25 MCG/ACT (0.025%) SOLN Place 1 spray into the nose daily as needed for allergies.    [name] FLUoxetine (PROZAC) 40 MG capsule Take 40 mg by mouth every morning.    . Gabapentin Enacarbil ER (HORIZANT) 300 MG TBCR Take 1 capsule by mouth daily. 30 tablet 2  . glipiZIDE (GLUCOTROL) 5 MG tablet Take 2.5 mg by mouth daily.     [name] ipratropium-albuterol (DUONEB) 0.5-2.5 (3) MG/3ML SOLN Take 3 mLs by nebulization every 6 (six) hours as needed. 120 mL 11  . KLOR-CON M20 20 MEQ  tablet TAKE 1 TABLET BY MOUTH EVERY MORNING 90 tablet 1  . LORazepam (ATIVAN) 0.5 MG tablet Take 1 tablet (0.5 mg total) by mouth 2 (two) times daily as needed for anxiety. Do not take when taking Klonopin. 15 tablet 0  . magic mouthwash SOLN Use as directed 5 mLs in the mouth or throat 4 (four) times daily as needed.  2  . Methylnaltrexone Bromide (RELISTOR) 150 MG TABS Take 3 tablets by mouth every morning. 90 tablet 11  . metoprolol succinate (TOPROL-XL) 25 MG 24 hr tablet Take 12.5 mg by mouth daily.    . mirtazapine (REMERON) 45 MG tablet Take 1 tablet (45 mg total) by mouth at bedtime.    [name] morphine (MS CONTIN) 30 MG 12 hr tablet Take 1 tablet (30 mg total) by mouth every 12 (twelve) hours. 60 tablet 0  . morphine (MSIR) 15 MG tablet Take 15 mg by mouth every 4 (  four) hours as needed for severe pain.     [name] ondansetron (ZOFRAN) 8 MG tablet TAKE 1 TABLET (8 MG TOTAL) BY MOUTH EVERY 8 (EIGHT) HOURS AS NEEDED FOR NAUSEA OR VOMITING. 20 tablet 0  . OXYGEN Inhale 2 L into the lungs daily.     . pantoprazole (PROTONIX) 40 MG tablet Take 40 mg by mouth daily.    . predniSONE (DELTASONE) 10 MG tablet TAKE 4 X 2 DAYS, 3 X 2 DAYS, 2 X 2 DAYS, 1 X 2 DAYS THEN BACK TO MAINTENANCE DOSE  0  . prochlorperazine (COMPAZINE) 10 MG tablet Take 1 tablet (10 mg total) by mouth every 6 (six) hours as needed for nausea or vomiting. 30 tablet 2  . roflumilast (DALIRESP) 500 MCG TABS tablet Take 500 mcg by mouth daily.    . tamsulosin (FLOMAX) 0.4 MG CAPS capsule Take 0.4 mg by mouth daily.    . theophylline (UNIPHYL) 400 MG 24 hr tablet Take 1 tablet (400 mg total) by mouth daily. 30 tablet 5  . Tiotropium Bromide Monohydrate (SPIRIVA RESPIMAT) 1.25 MCG/ACT AERS Inhale 2 puffs into the lungs daily. 4 g 11  . VENTOLIN HFA 108 (90 Base) MCG/ACT inhaler INHALE 2 PUFFS INTO THE LUNGS 4 TIMES DAILY AS NEEDED FOR WHEEZING 18 Inhaler 2  . nitroGLYCERIN (NITROSTAT) 0.4 MG SL tablet Place 1 tablet (0.4 mg total) under the  tongue every 5 (five) minutes as needed for chest pain. (Patient not taking: Reported on 11/26/2016) 25 tablet 3  . predniSONE (DELTASONE) 5 MG tablet Take 5 mg by mouth daily.  1   No current facility-administered medications for this visit.     SURGICAL HISTORY:  Past Surgical History:  Procedure Laterality Date  . ANGIOPLASTY    . ANTERIOR FUSION CERVICAL SPINE     cervical fusion  7 yrs ago ([facility])  . arm surgery     left elbow  . BACK SURGERY     lower  . c-spine surgery    . COLON SURGERY     '11 "Diverticulitis"  . COLONOSCOPY WITH PROPOFOL N/A 05/22/2015   Procedure: COLONOSCOPY WITH PROPOFOL;  Surgeon: Inda Castle, MD;  Location: [facility];  Service: Endoscopy;  Laterality: N/A;  . HIP SURGERY     left 'bone graft taken"  . PERIPHERAL VASCULAR CATHETERIZATION N/A 08/10/2015   Procedure: Lower Extremity Angiography;  Surgeon: Lorretta Harp, MD; Distal Ao OK, L-EIA stent OK, R-CIA 100% s/p overlapping 7 mm x 38 mm ICast stents, 50-60% R-CFA      . SHOULDER SURGERY     right  . SPINAL FUSION  03/05/2007   L4-L5  . VIDEO BRONCHOSCOPY WITH ENDOBRONCHIAL NAVIGATION N/A 10/04/2013   Procedure: VIDEO BRONCHOSCOPY WITH ENDOBRONCHIAL NAVIGATION;  Surgeon: Collene Gobble, MD;  Location: [facility];  Service: Thoracic;  Laterality: N/A;    REVIEW OF SYSTEMS:  Constitutional: positive for fatigue and weight loss Eyes: negative Ears, nose, mouth, throat, and face: negative Respiratory: positive for chronic bronchitis, cough, dyspnea on exertion and wheezing Cardiovascular: negative Gastrointestinal: negative Genitourinary:negative Integument/breast: negative Hematologic/lymphatic: negative Musculoskeletal:negative Neurological: negative Behavioral/Psych: negative Endocrine: negative Allergic/Immunologic: negative   PHYSICAL EXAMINATION: General appearance: alert, cooperative, fatigued and no distress Head: Normocephalic, without obvious abnormality, atraumatic Neck: no  adenopathy, no JVD, supple, symmetrical, trachea midline and thyroid not enlarged, symmetric, no tenderness/mass/nodules Lymph nodes: Cervical, supraclavicular, and axillary nodes normal. Resp: rhonchi bilaterally and wheezes bilaterally Back: symmetric, no curvature. ROM normal. No CVA tenderness. Cardio: regular  rate and rhythm, S1, S2 normal, no murmur, click, rub or gallop GI: soft, non-tender; bowel sounds normal; no masses,  no organomegaly Extremities: extremities normal, atraumatic, no cyanosis or edema Neurologic: Alert and oriented X 3, normal strength and tone. Normal symmetric reflexes. Normal coordination and gait  ECOG PERFORMANCE STATUS: 1 - Symptomatic but completely ambulatory  Blood pressure (!) 170/77, pulse (!) 116, temperature 98.3 F (36.8 C), temperature source Oral, resp. rate 18, weight 161 lb 1.6 oz (73.1 kg), SpO2 93 %.  LABORATORY DATA: Lab Results  Component Value Date   WBC 17.5 (H) 11/26/2016   HGB 12.3 (L) 11/26/2016   HCT 38.1 (L) 11/26/2016   MCV 89.8 11/26/2016   PLT 134 (L) 11/26/2016      Chemistry      Component Value Date/Time   NA 135 11/18/2016 0504   NA 133 (L) 11/05/2016 1110   K 4.2 11/18/2016 0504   K 4.4 11/05/2016 1110   CL 95 (L) 11/18/2016 0504   CO2 33 (H) 11/18/2016 0504   CO2 27 11/05/2016 1110   BUN 11 11/18/2016 0504   BUN 10.6 11/05/2016 1110   CREATININE 0.77 11/18/2016 0504   CREATININE 0.7 11/05/2016 1110      Component Value Date/Time   CALCIUM 9.1 11/18/2016 0504   CALCIUM 9.6 11/05/2016 1110   ALKPHOS 102 11/05/2016 1110   AST 28 11/05/2016 1110   ALT 21 11/05/2016 1110   BILITOT 0.48 11/05/2016 1110       RADIOGRAPHIC STUDIES: Dg Chest Port 1 View  Result Date: 11/14/2016 CLINICAL DATA:  Intubation. EXAM: PORTABLE CHEST 1 VIEW COMPARISON:  11/13/2016 .  09/12/2016. FINDINGS: Endotracheal tube and NG tube in stable position. Persistent severe mediastinal adenopathy and large right lung mass without  interim change again noted. No prominent pleural effusion. No pneumothorax. Left apical pleural thickening unchanged. Prior cervicothoracic spine fusion . IMPRESSION: 1. Lines and tubes in stable position. 2. Persistent severe mediastinal adenopathy and right lung mass without interim change. Electronically Signed   By: Marcello Moores  Register   On: 11/14/2016 07:34   Dg Chest Port 1 View  Result Date: 11/13/2016 CLINICAL DATA:  Endotracheal tube. EXAM: PORTABLE CHEST 1 VIEW COMPARISON:  11/12/2016 FINDINGS: Endotracheal tube projects between the clavicular heads and carina, unchanged. Enteric tube courses into the left upper abdomen with tip not imaged and side hole just beyond the GE junction. Right peritracheal and right lower lobe masses are similar to the prior study. Patchy airspace and interstitial opacities in the right lung predominantly involve the upper lobe and also have not significantly changed. Chronic apical left upper lobe consolidation and pleural thickening/loculated fluid are unchanged. No pneumothorax is identified. IMPRESSION: 1. Unchanged right lung opacities concerning for pneumonia. 2. Unchanged right paratracheal and right lower lobe masses. Electronically Signed   By: Logan Bores M.D.   On: 11/13/2016 08:52   Dg Chest Port 1 View  Result Date: 11/12/2016 CLINICAL DATA:  Endotracheal tube placement. EXAM: PORTABLE CHEST 1 VIEW COMPARISON:  Radiographs of November 11, 2016. FINDINGS: Endotracheal and nasogastric tubes are unchanged in position. Stable apical pleural thickening and scarring is noted. No pneumothorax is noted. Stable right peritracheal mass and right lower lobe mass is noted. Mild interstitial densities are noted throughout right lung concerning for edema or possibly inflammation. Bony thorax is unremarkable. IMPRESSION: Stable support apparatus. Stable right peritracheal mass and right lower lobe mass consistent with malignancy. Mild interstitial densities are noted  throughout right lung concerning for edema  or possibly inflammation. Electronically Signed   By: Marijo Conception, M.D.   On: 11/12/2016 08:12   Dg Chest Portable 1 View  Result Date: 11/11/2016 CLINICAL DATA:  Emergent intubation. Shortness of breath. History of lung cancer. EXAM: PORTABLE CHEST 1 VIEW COMPARISON:  Chest radiograph October 22, 2016 FINDINGS: Endotracheal tube tip projects 3 cm above the carina. Negative nasogastric tube tip and side-port project in proximal stomach. Multiple masses project in RIGHT lung, and appear larger than prior examination. LEFT apical pleural thickening and scarring. Predominately in RIGHT interstitial prominence. Increased lung volumes. No pleural effusions. No pneumothorax. Mild cardiomegaly. ACDF. Osseous structure nonsuspicious. IMPRESSION: Endotracheal tube tip projects 3 cm above the carina. Nasogastric tube tip projects in proximal stomach. Multiple RIGHT lung/lymphadenopathy masses appear larger though, this could be technical. Interstitial prominence RIGHT lung concerning for infection/postobstructive pneumonia versus acute edema. Electronically Signed   By: Elon Alas M.D.   On: 11/11/2016 04:14   Ct Angio Chest/abd/pel For Dissection W And/or Wo Contrast  Result Date: 11/11/2016 CLINICAL DATA:  61 year old male with elevated D-dimer and respiratory distress and hypoxia. History of lung cancer. EXAM: CT ANGIOGRAPHY CHEST, ABDOMEN AND PELVIS TECHNIQUE: Multidetector CT imaging through the chest, abdomen and pelvis was performed using the standard protocol during bolus administration of intravenous contrast. Multiplanar reconstructed images and MIPs were obtained and reviewed to evaluate the vascular anatomy. CONTRAST:  100 cc Isovue 370 COMPARISON:  Chest radiograph dated 11/11/2016 and chest CT dated 07/24/2016 and 06/07/2016 FINDINGS: CTA CHEST FINDINGS Cardiovascular: Top-normal cardiac size with mild dilatation of the left ventricle. No  pericardial effusion. There is coronary vascular calcification primarily involving the LAD and left circumflex artery. There is mild atherosclerotic plaque in the thoracic aorta. There is no aneurysmal dilatation or evidence of dissection. The origin of the right vertebral artery is not well visualized and is artery may be hypoplastic. The remainder of the visualized origins of the great vessels of the aortic arch appear patent. There is mild dilatation of the main pulmonary trunk suggestive of underlying pulmonary hypertension. No CT evidence of pulmonary embolism. Mediastinum/Nodes: Significant interval progression of right hilar and mediastinal adenopathy. Subcarinal adenopathy measures 6.5 x 9.5 cm (previously approximately 3.6 x 3.1 cm). Right upper mediastinal/paratracheal adenopathy or confluence of lymph nodes measure approximately 7.7 x 8.2 cm (previously 4.1 x 3.8 cm). There is loss of right paratracheal fat plane. An enteric tube is noted in the esophagus. There is infiltration of the paraesophageal fat. There is encasement and mild narrowing of the right upper lobe bronchus. There is also encasement of the bronchus intermedius. The central airways however remain patent. Lungs/Pleura: There is a 10.2 x 7.3 cm right lower lobe mass (previously 6.3 x 5.3 cm). This mass appears in continuity with the subcarinal mass. There is large area of airspace consolidation in the right upper lobe stop, most likely representing superimposed pneumonia. A 12 mm nodular density in the right upper lobe anteriorly likely pneumonia and less likely metastatic disease. There has been interval increase in the size of the left upper lobe consolidation/fibrosis and cavitation. There is a small right pleural effusion. No pneumothorax. There is mild right-sided tracheal shift likely related to right lung volume loss or traction fibrosis. An endotracheal diffuse is noted with tip approximately 3.5 cm above the carina.  Musculoskeletal: There is no axillary adenopathy. The chest wall soft tissues appear unremarkable. Lower cervical anterior fusion hardware. No acute fracture. Review of the MIP images confirms the above findings. CTA  ABDOMEN AND PELVIS FINDINGS VASCULAR Aorta: Moderate aortoiliac atherosclerotic disease. No aneurysmal dilatation or evidence of dissection Celiac: Patent without evidence of aneurysm, dissection, vasculitis or significant stenosis. SMA: Patent without evidence of aneurysm, dissection, vasculitis or significant stenosis. Renals: Both renal arteries are patent without evidence of aneurysm, dissection, vasculitis, fibromuscular dysplasia or significant stenosis. IMA: Patent without evidence of aneurysm, dissection, vasculitis or significant stenosis. Inflow: Advanced atherosclerotic calcification of the iliac vasculature. Left external iliac artery stent appears patent. Veins: No obvious venous abnormality within the limitations of this arterial phase study. Review of the MIP images confirms the above findings. NON-VASCULAR No intra-abdominal free air or free fluid. Hepatobiliary: Slight irregularity of the hepatic contour. No enhancing lesion. No intrahepatic biliary ductal dilatation. The gallbladder is unremarkable. Pancreas: Unremarkable. No pancreatic ductal dilatation or surrounding inflammatory changes. Spleen: A 5.1 x 3.5 cm inferior splenic hypodense lesion, incompletely characterized but similar to prior CT. Adrenals/Urinary Tract: The adrenal glands appear unremarkable. Probable small right renal cyst as seen on the prior CT. There is no hydronephrosis on either side. The visualized ureters and urinary bladder appear unremarkable. Stomach/Bowel: An enteric tube is noted with tip in the proximal stomach. Large amount of stool noted throughout the colon. There is sigmoid diverticulosis without active inflammatory changes. Focal thickening of the sigmoid colon likely related to underdistention and  muscular hypertrophy. The degree of stricture is not excluded. There is no evidence of bowel obstruction or active inflammation. Normal appendix. Lymphatic: Top-normal retroperitoneal and para-aortic lymph nodes. Reproductive: The prostate and seminal vesicles are grossly unremarkable. Other: None Musculoskeletal: L4-L5 posterior fusion hardware. No acute fracture. No suspicious bone lesions. Chronic irregularity of the left iliac crest. No acute fracture. Review of the MIP images confirms the above findings. IMPRESSION: No CT evidence of pulmonary embolism or aortic dissection. Progression of disease with interval increase in the size of the right lower lobe mass with significant interval decrease in the size of the right hilar and mediastinal adenopathy. Large upper lobe airspace consolidative changes most compatible with superimposed pneumonia. Small right pleural effusion. Left apical consolidative change, fibrosis and small cavitation similar to the prior CT. Stable appearing inferior splenic hypodense lesion. Top-normal retroperitoneal and para-aortic lymph nodes. Constipation. No evidence of bowel obstruction or active inflammation. Normal appendix. Sigmoid diverticulosis without active inflammatory changes. Short segment thickened appearance sigmoid colon likely related to muscular hypertrophy with possible mild stricture. Is Electronically Signed   By: Anner Crete M.D.   On: 11/11/2016 07:12    ASSESSMENT AND PLAN: This is a very pleasant 61 years old white male with: 1) metastatic non-small cell lung cancer, poorly differentiated squamous cell carcinoma initially diagnosed in November 2013 with a stage IIIa status post concurrent chemoradiation. He has disease recurrence earlier this year and the patient underwent several chemotherapy regimens but unfortunately he continues to have evidence for disease progression. He had recent CT scan of the chest, abdomen and pelvis that showed clear evidence  for disease progression in the chest was enlargement of the right lower lobe lung mass as well as increase in some of the mediastinal lymphadenopathy. I personally and independently reviewed the scan images and discuss the results with the patient and his wife. I had a lengthy discussion with him about his treatment options and goals of cares. I strongly encouraged the patient to consider palliative care and hospice at this point. The patient is not interested in hospice at this point and he would like to try one more regimen of  chemotherapy. I would consider the patient for treatment with single agent gemcitabine 1000 MG/M2 on days 1 and 8 every 3 weeks. I discussed with the patient adverse effect of this treatment including but not limited to alopecia, myelosuppression, nausea and vomiting, peripheral neuropathy, liver or renal dysfunction. He is expected to start the first cycle of this treatment next week. He would come back for follow-up visit in 4 weeks with the start of cycle #2. 2) COPD: The patient will continue his current treatment with Spiriva, Uniphyl, daliresp, prednisone and Ventolin inhaler. 3) hypertension: The patient will continue on Toprol-XL. I recommended for him to monitor his blood pressure closely at home and to reconsult with his primary care physician for adjustment of his medication. 4) depression: He will continue on Remeron 45 mg by mouth daily at bedtime. The patient was advised to call immediately if he has any concerning symptoms in the interval. The patient voices understanding of current disease status and treatment options and is in agreement with the current care plan.  All questions were answered. The patient knows to call the clinic with any problems, questions or concerns. We can certainly see the patient much sooner if necessary.  Disclaimer: This note was dictated with voice recognition software. Similar sounding words can inadvertently be transcribed and may  not be corrected upon review.

## 2016-11-26 NOTE — Progress Notes (Signed)
DISCONTINUE ON PATHWAY REGIMEN - Non-Small Cell Lung  [id number]: Docetaxel 75 mg/m2 + Ramucirumab 10 mg/kg q21 Days Until Progression or Unacceptable Toxicity   A cycle is every 21 days:     Ramucirumab (Cyramza (R)) 10 mg/kg in 250 mL NS IV over 60 minutes every 21 days on day 1, PRIOR TO DOCETAXEL. Not stable in D5W - dilute in NS only. Urine protein check recommended at baseline and periodically during therapy Dose Mod: None     Docetaxel (Taxotere(R)) 75 mg/m2 in 250 mL NS IV over 60 minutes every 21 days on day 1 Dose Mod: None Additional Orders: Premedicate with dexamethasone 8 mg PO BID for three days beginning 1 day prior to therapy.  **Always confirm dose/schedule in your pharmacy ordering system**    REASON: Disease Progression PRIOR TREATMENT: [id number]: Docetaxel 75 mg/m2 + Ramucirumab 10 mg/kg q21 Days Until Progression or Unacceptable Toxicity TREATMENT RESPONSE: Progressive Disease (PD)  START ON PATHWAY REGIMEN - Non-Small Cell Lung  [id number]: Afatinib 40 mg Daily Until Progression or Unacceptable Toxicity   Daily:     Afatinib (Gilotrif(R)) 40 mg flat dose orally once daily. Administer 1 hour before or 2 hours after a meal. Do not take missed dose within 12 hours of next dose. Dose Mod: None  **Always confirm dose/schedule in your pharmacy ordering system**    Patient Characteristics: Stage IV Metastatic, Squamous, PS = 0, 1, Third Line, Prior PD-1/PD-L1 Inhibitor or No Prior PD-1/PD-L1 Inhibitor and Not a Candidate for Immunotherapy Check here if patient was staged using an edition prior to AJCC Staging - 8th Edition (i.e., prior to November 25, 2016)? false AJCC T Category: T3 Current Disease Status: Distant Metastases AJCC N Category: N2 AJCC M Category: M1c AJCC 8 Stage Grouping: IVB Histology: Squamous Cell Line of therapy: Third Line PD-L1 Expression Status: PD-L1 Negative Performance Status: PS = 0, 1 Would you be surprised if this patient died  in the next  year? I would NOT be surprised if this patient died in the next year Immunotherapy Candidate Status: Not a Candidate for Immunotherapy Prior Immunotherapy Status: Prior PD-1/PD-L1 Inhibitor  Intent of Therapy: Non-Curative / Palliative Intent, Discussed with Patient

## 2016-11-27 ENCOUNTER — Encounter: Payer: Self-pay | Admitting: Internal Medicine

## 2016-11-27 DIAGNOSIS — M109 Gout, unspecified: Secondary | ICD-10-CM | POA: Diagnosis not present

## 2016-11-27 DIAGNOSIS — J440 Chronic obstructive pulmonary disease with acute lower respiratory infection: Secondary | ICD-10-CM | POA: Diagnosis not present

## 2016-11-27 DIAGNOSIS — E119 Type 2 diabetes mellitus without complications: Secondary | ICD-10-CM | POA: Diagnosis not present

## 2016-11-27 DIAGNOSIS — I2510 Atherosclerotic heart disease of native coronary artery without angina pectoris: Secondary | ICD-10-CM | POA: Diagnosis not present

## 2016-11-27 DIAGNOSIS — J9611 Chronic respiratory failure with hypoxia: Secondary | ICD-10-CM | POA: Diagnosis not present

## 2016-11-27 DIAGNOSIS — J189 Pneumonia, unspecified organism: Secondary | ICD-10-CM | POA: Diagnosis not present

## 2016-11-27 DIAGNOSIS — C3491 Malignant neoplasm of unspecified part of right bronchus or lung: Secondary | ICD-10-CM | POA: Diagnosis not present

## 2016-11-27 DIAGNOSIS — I10 Essential (primary) hypertension: Secondary | ICD-10-CM | POA: Diagnosis not present

## 2016-11-27 DIAGNOSIS — J449 Chronic obstructive pulmonary disease, unspecified: Secondary | ICD-10-CM | POA: Diagnosis not present

## 2016-11-27 NOTE — Progress Notes (Signed)
Faxed FMLA paperwork on 11/26/16 to [employer] @ [phone]

## 2016-11-29 ENCOUNTER — Encounter: Payer: Self-pay | Admitting: Internal Medicine

## 2016-11-29 DIAGNOSIS — J189 Pneumonia, unspecified organism: Secondary | ICD-10-CM | POA: Diagnosis not present

## 2016-11-29 DIAGNOSIS — I10 Essential (primary) hypertension: Secondary | ICD-10-CM | POA: Diagnosis not present

## 2016-11-29 DIAGNOSIS — C3491 Malignant neoplasm of unspecified part of right bronchus or lung: Secondary | ICD-10-CM | POA: Diagnosis not present

## 2016-11-29 DIAGNOSIS — E119 Type 2 diabetes mellitus without complications: Secondary | ICD-10-CM | POA: Diagnosis not present

## 2016-11-29 DIAGNOSIS — I2510 Atherosclerotic heart disease of native coronary artery without angina pectoris: Secondary | ICD-10-CM | POA: Diagnosis not present

## 2016-11-29 DIAGNOSIS — M109 Gout, unspecified: Secondary | ICD-10-CM | POA: Diagnosis not present

## 2016-11-29 DIAGNOSIS — J9611 Chronic respiratory failure with hypoxia: Secondary | ICD-10-CM | POA: Diagnosis not present

## 2016-11-29 DIAGNOSIS — J440 Chronic obstructive pulmonary disease with acute lower respiratory infection: Secondary | ICD-10-CM | POA: Diagnosis not present

## 2016-11-29 DIAGNOSIS — J449 Chronic obstructive pulmonary disease, unspecified: Secondary | ICD-10-CM | POA: Diagnosis not present

## 2016-11-29 NOTE — Progress Notes (Signed)
Faxed Critical Illness Claim Form info to [employer] at [phone]

## 2016-12-02 ENCOUNTER — Telehealth: Payer: Self-pay | Admitting: Internal Medicine

## 2016-12-02 ENCOUNTER — Encounter: Payer: Self-pay | Admitting: Internal Medicine

## 2016-12-02 DIAGNOSIS — I10 Essential (primary) hypertension: Secondary | ICD-10-CM | POA: Diagnosis not present

## 2016-12-02 DIAGNOSIS — I2510 Atherosclerotic heart disease of native coronary artery without angina pectoris: Secondary | ICD-10-CM | POA: Diagnosis not present

## 2016-12-02 DIAGNOSIS — J440 Chronic obstructive pulmonary disease with acute lower respiratory infection: Secondary | ICD-10-CM | POA: Diagnosis not present

## 2016-12-02 DIAGNOSIS — E119 Type 2 diabetes mellitus without complications: Secondary | ICD-10-CM | POA: Diagnosis not present

## 2016-12-02 DIAGNOSIS — J9611 Chronic respiratory failure with hypoxia: Secondary | ICD-10-CM | POA: Diagnosis not present

## 2016-12-02 DIAGNOSIS — C3491 Malignant neoplasm of unspecified part of right bronchus or lung: Secondary | ICD-10-CM | POA: Diagnosis not present

## 2016-12-02 DIAGNOSIS — J189 Pneumonia, unspecified organism: Secondary | ICD-10-CM | POA: Diagnosis not present

## 2016-12-02 DIAGNOSIS — J449 Chronic obstructive pulmonary disease, unspecified: Secondary | ICD-10-CM | POA: Diagnosis not present

## 2016-12-02 DIAGNOSIS — M109 Gout, unspecified: Secondary | ICD-10-CM | POA: Diagnosis not present

## 2016-12-02 NOTE — Telephone Encounter (Signed)
Wayne from [facility] wanted to make you aware that pt had a fall Saturday. He was going downstairs and missed a step.  There were no injuries and pt was able to participate in PT today.

## 2016-12-02 NOTE — Progress Notes (Unsigned)
Faxed FMLA paperwork to [employer] at [employer] number [phone]. Put paperwork at front reception desk for pt p/u

## 2016-12-03 ENCOUNTER — Ambulatory Visit: Payer: Commercial Managed Care - HMO

## 2016-12-03 ENCOUNTER — Other Ambulatory Visit: Payer: Commercial Managed Care - HMO

## 2016-12-03 ENCOUNTER — Telehealth: Payer: Self-pay | Admitting: Emergency Medicine

## 2016-12-03 ENCOUNTER — Telehealth: Payer: Self-pay | Admitting: Medical Oncology

## 2016-12-03 NOTE — Telephone Encounter (Signed)
Can contact patient if you think it would be beneficial.

## 2016-12-03 NOTE — Telephone Encounter (Signed)
Pt cancelled tx today.

## 2016-12-03 NOTE — Telephone Encounter (Signed)
I will see him as scheduled not earlier.

## 2016-12-03 NOTE — Telephone Encounter (Signed)
[facility] called and stated she has made 3 appointments with patient to go out and visit but patients has called and cancelled all 3 visits. She just wanted to make you aware. Thanks.

## 2016-12-04 ENCOUNTER — Other Ambulatory Visit: Payer: Self-pay | Admitting: Internal Medicine

## 2016-12-04 ENCOUNTER — Emergency Department (HOSPITAL_COMMUNITY): Payer: Medicare HMO

## 2016-12-04 ENCOUNTER — Observation Stay (HOSPITAL_COMMUNITY)
Admission: EM | Admit: 2016-12-04 | Discharge: 2016-12-06 | Disposition: A | Discharge status: Hospice | Payer: Medicare HMO | Attending: Internal Medicine | Admitting: Internal Medicine

## 2016-12-04 ENCOUNTER — Encounter (HOSPITAL_COMMUNITY): Payer: Self-pay | Admitting: Emergency Medicine

## 2016-12-04 ENCOUNTER — Other Ambulatory Visit: Payer: Self-pay | Admitting: Medical Oncology

## 2016-12-04 ENCOUNTER — Telehealth: Payer: Self-pay | Admitting: *Deleted

## 2016-12-04 ENCOUNTER — Telehealth: Payer: Self-pay | Admitting: Medical Oncology

## 2016-12-04 DIAGNOSIS — I2510 Atherosclerotic heart disease of native coronary artery without angina pectoris: Secondary | ICD-10-CM | POA: Insufficient documentation

## 2016-12-04 DIAGNOSIS — R0602 Shortness of breath: Secondary | ICD-10-CM | POA: Diagnosis not present

## 2016-12-04 DIAGNOSIS — C3490 Malignant neoplasm of unspecified part of unspecified bronchus or lung: Secondary | ICD-10-CM

## 2016-12-04 DIAGNOSIS — J9622 Acute and chronic respiratory failure with hypercapnia: Secondary | ICD-10-CM | POA: Diagnosis not present

## 2016-12-04 DIAGNOSIS — Z515 Encounter for palliative care: Secondary | ICD-10-CM | POA: Insufficient documentation

## 2016-12-04 DIAGNOSIS — F418 Other specified anxiety disorders: Secondary | ICD-10-CM | POA: Insufficient documentation

## 2016-12-04 DIAGNOSIS — J440 Chronic obstructive pulmonary disease with acute lower respiratory infection: Secondary | ICD-10-CM | POA: Diagnosis not present

## 2016-12-04 DIAGNOSIS — Z85118 Personal history of other malignant neoplasm of bronchus and lung: Secondary | ICD-10-CM | POA: Diagnosis not present

## 2016-12-04 DIAGNOSIS — Z7982 Long term (current) use of aspirin: Secondary | ICD-10-CM | POA: Insufficient documentation

## 2016-12-04 DIAGNOSIS — C3492 Malignant neoplasm of unspecified part of left bronchus or lung: Secondary | ICD-10-CM | POA: Diagnosis not present

## 2016-12-04 DIAGNOSIS — Z981 Arthrodesis status: Secondary | ICD-10-CM | POA: Diagnosis not present

## 2016-12-04 DIAGNOSIS — K219 Gastro-esophageal reflux disease without esophagitis: Secondary | ICD-10-CM | POA: Diagnosis not present

## 2016-12-04 DIAGNOSIS — J189 Pneumonia, unspecified organism: Secondary | ICD-10-CM | POA: Insufficient documentation

## 2016-12-04 DIAGNOSIS — J9621 Acute and chronic respiratory failure with hypoxia: Secondary | ICD-10-CM | POA: Diagnosis not present

## 2016-12-04 DIAGNOSIS — F5105 Insomnia due to other mental disorder: Secondary | ICD-10-CM | POA: Insufficient documentation

## 2016-12-04 DIAGNOSIS — J9589 Other postprocedural complications and disorders of respiratory system, not elsewhere classified: Secondary | ICD-10-CM | POA: Diagnosis not present

## 2016-12-04 DIAGNOSIS — E1151 Type 2 diabetes mellitus with diabetic peripheral angiopathy without gangrene: Secondary | ICD-10-CM | POA: Insufficient documentation

## 2016-12-04 DIAGNOSIS — Z794 Long term (current) use of insulin: Secondary | ICD-10-CM

## 2016-12-04 DIAGNOSIS — Z923 Personal history of irradiation: Secondary | ICD-10-CM | POA: Insufficient documentation

## 2016-12-04 DIAGNOSIS — M109 Gout, unspecified: Secondary | ICD-10-CM | POA: Insufficient documentation

## 2016-12-04 DIAGNOSIS — R112 Nausea with vomiting, unspecified: Secondary | ICD-10-CM

## 2016-12-04 DIAGNOSIS — E1111 Type 2 diabetes mellitus with ketoacidosis with coma: Secondary | ICD-10-CM

## 2016-12-04 DIAGNOSIS — T451X5A Adverse effect of antineoplastic and immunosuppressive drugs, initial encounter: Secondary | ICD-10-CM

## 2016-12-04 DIAGNOSIS — J441 Chronic obstructive pulmonary disease with (acute) exacerbation: Secondary | ICD-10-CM | POA: Diagnosis not present

## 2016-12-04 DIAGNOSIS — G4733 Obstructive sleep apnea (adult) (pediatric): Secondary | ICD-10-CM | POA: Insufficient documentation

## 2016-12-04 DIAGNOSIS — C3491 Malignant neoplasm of unspecified part of right bronchus or lung: Secondary | ICD-10-CM | POA: Diagnosis not present

## 2016-12-04 DIAGNOSIS — E119 Type 2 diabetes mellitus without complications: Secondary | ICD-10-CM | POA: Diagnosis not present

## 2016-12-04 DIAGNOSIS — E785 Hyperlipidemia, unspecified: Secondary | ICD-10-CM | POA: Insufficient documentation

## 2016-12-04 DIAGNOSIS — J9602 Acute respiratory failure with hypercapnia: Secondary | ICD-10-CM

## 2016-12-04 DIAGNOSIS — Z7984 Long term (current) use of oral hypoglycemic drugs: Secondary | ICD-10-CM | POA: Insufficient documentation

## 2016-12-04 DIAGNOSIS — I119 Hypertensive heart disease without heart failure: Secondary | ICD-10-CM | POA: Insufficient documentation

## 2016-12-04 DIAGNOSIS — Z888 Allergy status to other drugs, medicaments and biological substances status: Secondary | ICD-10-CM | POA: Insufficient documentation

## 2016-12-04 DIAGNOSIS — Z87891 Personal history of nicotine dependence: Secondary | ICD-10-CM | POA: Diagnosis not present

## 2016-12-04 DIAGNOSIS — J9601 Acute respiratory failure with hypoxia: Secondary | ICD-10-CM | POA: Diagnosis present

## 2016-12-04 DIAGNOSIS — Z66 Do not resuscitate: Secondary | ICD-10-CM | POA: Diagnosis not present

## 2016-12-04 DIAGNOSIS — R0600 Dyspnea, unspecified: Secondary | ICD-10-CM | POA: Diagnosis present

## 2016-12-04 DIAGNOSIS — J9612 Chronic respiratory failure with hypercapnia: Secondary | ICD-10-CM | POA: Diagnosis present

## 2016-12-04 DIAGNOSIS — J449 Chronic obstructive pulmonary disease, unspecified: Secondary | ICD-10-CM | POA: Diagnosis not present

## 2016-12-04 DIAGNOSIS — I739 Peripheral vascular disease, unspecified: Secondary | ICD-10-CM | POA: Diagnosis present

## 2016-12-04 DIAGNOSIS — I10 Essential (primary) hypertension: Secondary | ICD-10-CM | POA: Diagnosis not present

## 2016-12-04 DIAGNOSIS — J9611 Chronic respiratory failure with hypoxia: Secondary | ICD-10-CM | POA: Diagnosis not present

## 2016-12-04 MED ORDER — ONDANSETRON HCL 8 MG PO TABS
ORAL_TABLET | ORAL | 0 refills | Status: DC
Start: 2016-12-04 — End: 2016-12-06

## 2016-12-04 MED ORDER — IPRATROPIUM BROMIDE 0.02 % IN SOLN
0.5000 mg | Freq: Once | RESPIRATORY_TRACT | Status: AC
  Administered 2016-12-05: 0.5 mg via RESPIRATORY_TRACT
  Filled 2016-12-04: qty 2.5

## 2016-12-04 MED ORDER — ONDANSETRON HCL 4 MG/2ML IJ SOLN
4.0000 mg | Freq: Once | INTRAMUSCULAR | Status: AC
  Administered 2016-12-04: 4 mg via INTRAVENOUS
  Filled 2016-12-04: qty 2

## 2016-12-04 MED ORDER — LORAZEPAM 0.5 MG PO TABS: 0.5000 mg | ORAL_TABLET | Freq: Two times a day (BID) | ORAL | 0 refills | Status: DC | PRN

## 2016-12-04 MED ORDER — ALBUTEROL (5 MG/ML) CONTINUOUS INHALATION SOLN
10.0000 mg/h | INHALATION_SOLUTION | RESPIRATORY_TRACT | Status: DC
  Administered 2016-12-05: 10 mg/h via RESPIRATORY_TRACT
  Filled 2016-12-04: qty 20

## 2016-12-04 MED ORDER — METHYLPREDNISOLONE SODIUM SUCC 125 MG IJ SOLR
125.0000 mg | Freq: Once | INTRAMUSCULAR | Status: AC
Start: 2016-12-04 — End: 2016-12-04
  Administered 2016-12-04: 125 mg via INTRAVENOUS
  Filled 2016-12-04: qty 2

## 2016-12-04 MED ORDER — PROCHLORPERAZINE MALEATE 10 MG PO TABS: 10.0000 mg | ORAL_TABLET | Freq: Four times a day (QID) | ORAL | 2 refills | Status: DC | PRN

## 2016-12-04 NOTE — ED Provider Notes (Signed)
[facility] DEPT Provider Note   CSN: [id number] Arrival date & time: 12/04/16  2245   By signing my name below, I, Dolores Hoose, attest that this documentation has been prepared under the direction and in the presence of Quintella Reichert, MD . Electronically Signed: Dolores Hoose, Scribe. 12/04/2016. 11:33 PM.  History   Chief Complaint Chief Complaint  Patient presents with  . Shortness of Breath   The history is provided by the patient. No language interpreter was used.   HPI Comments:  Ernest [name] is a 61 y.o. male with pmhx of COPD, DM, lung CA, CAD, and HTN who presents to the Emergency Department complaining of chronic, worsened, moderate SOB since yesterday morning. Pt states that he is usually short of breath but his symptoms today are particularly bad. Pt has tried his at-home inhaler with minimal relief. He reports associated diffuse muscle twitching, vomiting and nausea since yesterday morning and states that he has not been able to keep anything down. Per EMS, pt was given '4mg'$  Zofran and 5 albuterol en route to the hospital for his symptoms. He denies any fever, chest pain, diarrhea or lower extremity swelling. Pt is compliant with 3L of oxygen at home.   Old records reviewed.   Past Medical History:  Diagnosis Date  . Acute bronchitis 11/05/2016  . Anxiety   . CAD (coronary artery disease)    Left Main 30% stenosis, LAD 20 - 30 % stenosis, first and second diagonal branchesat 40 - 50%  stenosis with small arteries, circumflex had 30% stenosis in the large obtuse marginal, RCA at 70 - 80%  stenosis [not felt to be occlusive after evaluation with flow wire], distal 50 - 60% stenosis - [name][  . Chronic insomnia   . COPD with asthma ([place]) 09/08/2007  . DDD (degenerative disc disease)   . Depression   . Diabetes mellitus without complication ([place]) 02/07/4007   no medications now 07/24/2016  . GERD (gastroesophageal reflux disease)   . Gout   . History  of cardiovascular stress test    Myoview 7/16:  Diaphragmatic attenuation, no ischemia, EF 56%; Low Risk  . History of radiation therapy 11/10/13- 12/29/13   left lung 6600 cGy in 33 sessions  . Hx of colonoscopy   . Hyperlipidemia   . Hypertension    dr Percival Spanish  . Itching due to drug 03/19/2016  . Lung cancer ([place]) 10/04/13   LUL squamous cell lung cancer  . Lung cancer ([place])   . Nausea with vomiting 03/26/2016  . OSA (obstructive sleep apnea)    NPSG 09/10/10- AHI 11.3/hr  . PVD (peripheral vascular disease) ([place])    PTA/Stent right common iliac    Patient Active Problem List   Diagnosis Date Noted  . Dyspnea 12/05/2016  . Acute respiratory failure with hypoxia ([place]) 12/05/2016  . Pressure injury of skin 11/18/2016  . Postobstructive pneumonia   . Non-small cell cancer of right lung ([place])   . OSA (obstructive sleep apnea)   . CAD in native artery   . Type 2 diabetes mellitus with complication ([place])   . History of ETT   . Goals of care, counseling/discussion   . Acute respiratory failure with hypoxia and hypercapnia ([place]) 11/11/2016  . Acute bronchitis 11/05/2016  . Tinea corporis 10/31/2016  . Hyponatremia 10/22/2016  . Anxiety 10/22/2016  . Neoplasm related pain 08/27/2016  . Acute seasonal allergic rhinitis due to pollen 08/27/2016  . Insomnia due to anxiety and fear 08/01/2016  .  Therapeutic opioid-induced constipation ([facility]IC) 07/18/2016  . Shortness of breath 06/05/2016  . Nausea without vomiting 03/26/2016  . Encounter for antineoplastic chemotherapy 02/20/2016  . 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 COPD exacerbation ([place]) 11/24/2015  . Rhinitis, nonallergic, chronic 09/22/2015  . 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 Claudication ([place]) 08/10/2015  . Coronary artery disease involving native coronary artery of native heart without angina pectoris 05/17/2015  . Diabetes mellitus without complication ([place]) 27/25/3664  . Palliative care encounter   . Chronic  respiratory failure with hypercapnia ([place])   . Chronic pain syndrome   . Chronic respiratory failure with hypoxia ([place]) 12/15/2014  . Non-small cell carcinoma of lung, right ([place]) 10/07/2013  . Obstructive sleep apnea 09/24/2010  . Peripheral vascular disease ([place]) 05/15/2010  . Hyperlipidemia with target LDL less than 70 05/16/2009  . GOUT 09/08/2007  . Depression with anxiety 09/08/2007  . Essential hypertension 09/08/2007  . COPD mixed type ([place]) 09/08/2007  . GERD 09/08/2007    Past Surgical History:  Procedure Laterality Date  . ANGIOPLASTY    . ANTERIOR FUSION CERVICAL SPINE     cervical fusion  7 yrs ago ([facility])  . arm surgery     left elbow  . BACK SURGERY     lower  . c-spine surgery    . COLON SURGERY     '11 "Diverticulitis"  . COLONOSCOPY WITH PROPOFOL N/A 05/22/2015   Procedure: COLONOSCOPY WITH PROPOFOL;  Surgeon: Inda Castle, MD;  Location: [facility];  Service: Endoscopy;  Laterality: N/A;  . HIP SURGERY     left 'bone graft taken"  . PERIPHERAL VASCULAR CATHETERIZATION N/A 08/10/2015   Procedure: Lower Extremity Angiography;  Surgeon: Lorretta Harp, MD; Distal Ao OK, L-EIA stent OK, R-CIA 100% s/p overlapping 7 mm x 38 mm ICast stents, 50-60% R-CFA      . SHOULDER SURGERY     right  . SPINAL FUSION  03/05/2007   L4-L5  . VIDEO BRONCHOSCOPY WITH ENDOBRONCHIAL NAVIGATION N/A 10/04/2013   Procedure: VIDEO BRONCHOSCOPY WITH ENDOBRONCHIAL NAVIGATION;  Surgeon: Collene Gobble, MD;  Location: [facility];  Service: Thoracic;  Laterality: N/A;       Home Medications    Prior to Admission medications   Medication Sig Start Date End Date Taking? Authorizing Provider  ADVAIR DISKUS 500-50 MCG/DOSE AEPB 1 PUFF THEN RINSE MOUTH, TWICE DAILY MAINTENANCE 04/26/16  Yes Deneise Lever, MD  albuterol (PROVENTIL) (2.5 MG/3ML) 0.083% nebulizer solution INHALE 1 VIAL IN NEBULIZER EVERY 4 HOURS AS NEEDED FOR WHEEZING OR SHORTNESS OF BREATH 11/08/16  Yes Deneise Lever, MD    ALPRAZolam Duanne Moron) 0.5 MG tablet Take 0.5-1 mg by mouth every 4 (four) hours as needed (for breathing-related anxiety).   Yes Historical Provider, MD  arformoterol (BROVANA) 15 MCG/2ML NEBU Take 2 mLs (15 mcg total) by nebulization 2 (two) times daily. 11/06/16  Yes Deneise Lever, MD  ARIPiprazole (ABILIFY) 5 MG tablet Take 5 mg by mouth every morning. 11/21/16  Yes Historical Provider, MD  Armodafinil 250 MG tablet Take 250 mg by mouth every morning. 03/08/16  Yes Historical Provider, MD  aspirin 81 MG EC tablet TAKE 1 TABLET BY MOUTH EVERY DAY 07/07/16  Yes Janith Lima, MD  atorvastatin (LIPITOR) 20 MG tablet TAKE 1 TABLET EVERY DAY Patient taking differently: TAKE 20 MG  EVERY DAY IN THE EVENING 08/01/15  Yes Dorena Cookey, MD  ciclopirox (LOPROX) 0.77 % cream Apply topically 2 (two) times daily. 10/31/16  Yes Janith Lima, MD  clonazePAM (KLONOPIN) 1 MG tablet Take 2 mg by mouth at bedtime as needed for anxiety (or sleep). Reported on 01/16/2016   Yes Historical Provider, MD  dexamethasone (DECADRON) 4 MG tablet 2 tablets by mouth twice a day the day before, day of and day after the chemotherapy every 3 weeks 09/17/16  Yes Curt Bears, MD  dextromethorphan-guaiFENesin [facility] DM) 30-600 MG 12hr tablet Take 1 tablet by mouth 2 (two) times daily as needed for cough (congestion).   Yes Historical Provider, MD  esomeprazole (NEXIUM) 40 MG capsule TAKE ONE CAPSULE BY MOUTH EVERY DAY Patient taking differently: TAKE 40 MG  BY MOUTH TWICE DAILY. 05/06/16  Yes Janith Lima, MD  feeding supplement, ENSURE ENLIVE, (ENSURE ENLIVE) LIQD Take 237 mLs by mouth 2 (two) times daily between meals. 05/23/16  Yes Nishant Dhungel, MD  flunisolide (NASALIDE) 25 MCG/ACT (0.025%) SOLN Place 1 spray into the nose daily as needed for allergies. 10/23/16  Yes Historical Provider, MD  FLUoxetine (PROZAC) 40 MG capsule Take 40 mg by mouth every morning.   Yes Historical Provider, MD  Gabapentin Enacarbil ER  (HORIZANT) 300 MG TBCR Take 1 capsule by mouth daily. 09/13/16  Yes Janith Lima, MD  glipiZIDE (GLUCOTROL) 5 MG tablet Take 2.5 mg by mouth daily.  10/16/16  Yes Historical Provider, MD  ipratropium-albuterol (DUONEB) 0.5-2.5 (3) MG/3ML SOLN Take 3 mLs by nebulization every 6 (six) hours as needed. Patient taking differently: Take 3 mLs by nebulization every 6 (six) hours as needed (SOB).  11/20/16  Yes Deneise Lever, MD  KLOR-CON M20 20 MEQ tablet TAKE 1 TABLET BY MOUTH EVERY MORNING 07/22/16  Yes Janith Lima, MD  LORazepam (ATIVAN) 0.5 MG tablet Take 1 tablet (0.5 mg total) by mouth 2 (two) times daily as needed for anxiety. Do not take when taking Klonopin. 12/04/16  Yes Curt Bears, MD  magic mouthwash SOLN Use as directed 5 mLs in the mouth or throat 4 (four) times daily as needed. 10/18/16  Yes Historical Provider, MD  Methylnaltrexone Bromide (RELISTOR) 150 MG TABS Take 3 tablets by mouth every morning. 07/18/16  Yes Janith Lima, MD  metoprolol succinate (TOPROL-XL) 25 MG 24 hr tablet Take 12.5 mg by mouth daily.   Yes Historical Provider, MD  mirtazapine (REMERON) 45 MG tablet Take 1 tablet (45 mg total) by mouth at bedtime. 12/29/14  Yes Tammy S Parrett, NP  morphine (MS CONTIN) 30 MG 12 hr tablet Take 1 tablet (30 mg total) by mouth every 12 (twelve) hours. 09/24/16  Yes Janith Lima, MD  morphine (MSIR) 15 MG tablet Take 15 mg by mouth every 4 (four) hours as needed for severe pain.  10/31/16  Yes Historical Provider, MD  nitroGLYCERIN (NITROSTAT) 0.4 MG SL tablet Place 1 tablet (0.4 mg total) under the tongue every 5 (five) minutes as needed for chest pain. 08/09/14  Yes Minus Breeding, MD  ondansetron (ZOFRAN) 8 MG tablet TAKE 1 TABLET (8 MG TOTAL) BY MOUTH EVERY 8 (EIGHT) HOURS AS NEEDED FOR NAUSEA OR VOMITING. 12/04/16  Yes Curt Bears, MD  pantoprazole (PROTONIX) 40 MG tablet Take 40 mg by mouth daily.   Yes Historical Provider, MD  predniSONE (DELTASONE) 5 MG tablet Take  5 mg by mouth daily. 11/01/16  Yes Historical Provider, MD  prochlorperazine (COMPAZINE) 10 MG tablet Take 1 tablet (10 mg total) by mouth every 6 (six) hours as needed for nausea or vomiting. 12/04/16  Yes Mohamed  Mohamed, MD  roflumilast (DALIRESP) 500 MCG TABS tablet Take 500 mcg by mouth daily.   Yes Historical Provider, MD  tamsulosin (FLOMAX) 0.4 MG CAPS capsule Take 0.4 mg by mouth daily.   Yes Historical Provider, MD  theophylline (UNIPHYL) 400 MG 24 hr tablet Take 1 tablet (400 mg total) by mouth daily. 08/12/16  Yes Deneise Lever, MD  Tiotropium Bromide Monohydrate (SPIRIVA RESPIMAT) 1.25 MCG/ACT AERS Inhale 2 puffs into the lungs daily. 04/16/16  Yes Janith Lima, MD  VENTOLIN HFA 108 (90 Base) MCG/ACT inhaler INHALE 2 PUFFS INTO THE LUNGS 4 TIMES DAILY AS NEEDED FOR WHEEZING 06/03/16  Yes Deneise Lever, MD  fluconazole (DIFLUCAN) 100 MG tablet Take 1 tablet (100 mg total) by mouth daily. Patient not taking: Reported on 12/05/2016 10/22/16   Susanne Borders, NP  OXYGEN Inhale 2 L into the lungs daily.     Historical Provider, MD    Family History Family History  Problem Relation Age of Onset  . Heart attack Mother   . Heart attack Sister   . Lung cancer Sister   . Cancer Sister     small cell lung, mets to brain  . Hypertension Brother   . Emphysema Sister   . Stroke Neg Hx     Social History Social History  Substance Use Topics  . Smoking status: Former Smoker    Packs/day: 0.00    Years: 43.00    Types: Cigarettes    Quit date: 09/23/2013  . Smokeless tobacco: Never Used     Comment: history of 3 PPD, 11/02/13   . Alcohol use No     Allergies   Carboplatin   Review of Systems Review of Systems  Constitutional: Negative for fever.  Respiratory: Positive for shortness of breath.   Cardiovascular: Negative for chest pain and leg swelling.  Gastrointestinal: Positive for nausea and vomiting. Negative for diarrhea.  Neurological:       Positive for Twitching    All other systems reviewed and are negative.    Physical Exam Updated Vital Signs BP 121/84   Pulse 107   Temp 97.8 F (36.6 C) (Oral)   Resp 22   Ht '5\' 6"'$  (1.676 m)   Wt 161 lb (73 kg)   SpO2 96%   BMI 25.99 kg/m   Physical Exam  Constitutional: He is oriented to person, place, and time.  Chronically ill appearing  HENT:  Head: Normocephalic and atraumatic.  Cardiovascular: Regular rhythm.  Tachycardia present.   No murmur heard. Distant heart sounds  Pulmonary/Chest: Effort normal. Tachypnea noted. No respiratory distress. He has wheezes. He has rales.  Rhonchi, wheezes, and rales bilaterally.   Abdominal: Soft. There is no tenderness. There is no rebound and no guarding.  Musculoskeletal: He exhibits no edema or tenderness.  Neurological: He is alert and oriented to person, place, and time.  Skin: Skin is warm and dry.  Psychiatric: He has a normal mood and affect. His behavior is normal.  Nursing note and vitals reviewed.  ED Treatments / Results  DIAGNOSTIC STUDIES:  Oxygen Saturation is 95% on [place], adequate by my interpretation.    COORDINATION OF CARE:  11:40 PM Discussed treatment plan with pt at bedside which includes CXR and blood work to check his electrolytes and pt agreed to plan.   1:25 AM Spoke to pt and family who are requesting comfort care.   1:29 AM Consult with Dr. Hal Hope who plans to admit   Labs (all  labs ordered are listed, but only abnormal results are displayed) Labs Reviewed  COMPREHENSIVE METABOLIC PANEL - Abnormal; Notable for the following:       Result Value   Sodium 132 (*)    Chloride 95 (*)    Glucose, Bld 104 (*)    Albumin 3.4 (*)    Total Bilirubin 1.5 (*)    All other components within normal limits  CBC WITH DIFFERENTIAL/PLATELET - Abnormal; Notable for the following:    WBC 20.8 (*)    RDW 16.7 (*)    Neutro Abs 17.2 (*)    Monocytes Absolute 2.1 (*)    All other components within normal limits    EKG  EKG  Interpretation  Date/Time:  Wednesday December 04 2016 23:04:47 EST Ventricular Rate:  114 PR Interval:    QRS Duration: 110 QT Interval:  331 QTC Calculation: 456 R Axis:   94 Text Interpretation:  Sinus tachycardia Right axis deviation Borderline repolarization abnormality Confirmed by Hazle Coca [id number]) on 12/04/2016 11:42:42 PM       Radiology Dg Chest Port 1 View  Result Date: 12/05/2016 CLINICAL DATA:  Acute onset of worsening shortness of breath. Initial encounter. EXAM: PORTABLE CHEST 1 VIEW COMPARISON:  Chest radiograph performed 11/14/2016, and CT of the chest performed 11/11/2016 FINDINGS: The patient's large right lung and mediastinal masses are again noted, with redistribution of surrounding right basilar airspace opacification, concerning for persistent superimposed postobstructive pneumonia. There is no evidence of pleural effusion or pneumothorax. Chronic left apical scarring is noted. The cardiomediastinal silhouette is enlarged, reflecting the mediastinal mass. No acute osseous abnormalities are seen. Cervical spinal fusion hardware is noted. IMPRESSION: Redistribution of right basilar airspace opacification, concerning for persistent superimposed postobstructive pneumonia, with underlying large right lung and mediastinal masses again seen. Electronically Signed   By: Garald Balding M.D.   On: 12/05/2016 00:09    Procedures Procedures (including critical care time)  Medications Ordered in ED Medications  albuterol (PROVENTIL) (2.5 MG/3ML) 0.083% nebulizer solution 2.5 mg (not administered)  arformoterol (BROVANA) nebulizer solution 15 mcg (15 mcg Nebulization Not Given 12/05/16 0317)  ipratropium-albuterol (DUONEB) 0.5-2.5 (3) MG/3ML nebulizer solution 3 mL (3 mLs Nebulization Given 12/05/16 0316)  morphine (MSIR) tablet 15 mg (not administered)  Gabapentin Enacarbil ER TBCR 1 capsule (not administered)  mometasone-formoterol (DULERA) 200-5 MCG/ACT inhaler 2 puff (not  administered)  morphine 2 MG/ML injection 2 mg (not administered)  acetaminophen (TYLENOL) tablet 650 mg (not administered)    Or  acetaminophen (TYLENOL) suppository 650 mg (not administered)  ondansetron (ZOFRAN) tablet 4 mg ( Oral See Alternative 12/05/16 0603)    Or  ondansetron (ZOFRAN) injection 4 mg (4 mg Intravenous Given 12/05/16 0603)  enoxaparin (LOVENOX) injection 40 mg (not administered)  methylPREDNISolone sodium succinate (SOLU-MEDROL) 125 mg/2 mL injection 125 mg (125 mg Intravenous Given 12/04/16 2350)  ipratropium (ATROVENT) nebulizer solution 0.5 mg (0.5 mg Nebulization Given 12/05/16 0002)  ondansetron (ZOFRAN) injection 4 mg (4 mg Intravenous Given 12/04/16 2348)  LORazepam (ATIVAN) injection 0.5 mg (0.5 mg Intravenous Given 12/05/16 0137)     Initial Impression / Assessment and Plan / ED Course  I have reviewed the triage vital signs and the nursing notes.  Pertinent labs & imaging results that were available during my care of the patient were reviewed by me and considered in my medical decision making (see chart for details).  Clinical Course     Patient with metastatic cancer here with increased shortness of breath, vomiting, muscle twitching.  He is ill appearing on examination with increased work of breathing. Following hour-long neb treatment there was no significant change in his work of breathing. Given muscle itching will provide Ativan to see if this helps his symptoms. Patient is trying to enroll in hospice for his lung cancer given he has exhausted his treatment options. Plan to admit for comfort measures given his continued increased work of breathing.  Final Clinical Impressions(s) / ED Diagnoses   Final diagnoses:  None    New Prescriptions Current Discharge Medication List    I personally performed the services described in this documentation, which was scribed in my presence. The recorded information has been reviewed and is accurate.       Quintella Reichert, MD 12/05/16 [phone]

## 2016-12-04 NOTE — Telephone Encounter (Signed)
Per Julien Nordmann I ordered ativan for nausea and vomiting. Larena Glassman and pt wife notified.

## 2016-12-04 NOTE — ED Notes (Signed)
X RAY at bedside 

## 2016-12-04 NOTE — ED Notes (Signed)
Bed: WA18 Expected date:  Expected time:  Means of arrival:  Comments: EMS 

## 2016-12-04 NOTE — Telephone Encounter (Signed)
"  [place] [facility] [phone].  Patient's wife called reporting he's been vomiting every couple of hours since last night.  Looks like clear phlegm.  Has given him Compazine and Zofran (not on medication list) have not helped.  She's asking for something to make him feel better.  He's had a BM this morning, unable to take Mucinex due to size and not crushable.  November 11, 2016 was admitted with pneumonia but c/o nausea rather than a cough."

## 2016-12-04 NOTE — Telephone Encounter (Signed)
Wife notified.

## 2016-12-04 NOTE — Telephone Encounter (Signed)
Pt not ready for tx on 1/16 and will see Julien Nordmann the following week, Pt not sure about calling in hospice. Pt has DNR.

## 2016-12-04 NOTE — Telephone Encounter (Signed)
Persistent nausea - Requests refill for zofran and compazine and anything else to help with nausea

## 2016-12-04 NOTE — ED Triage Notes (Signed)
Per EMS pt from home. Hx of CHF,  COPD and lung cancer. Pt c/o shortness of breath, generalized weakness, and abdominal pain. Pt reports respiratory symptoms at home and took inhaler w/ no relief. N/V w/ no diarrhea per pt.  Pt AOx4.  Treatment in Route: Neb treatment 5 albuterol, '4mg'$  zofran .

## 2016-12-04 NOTE — ED Notes (Signed)
Bed: TR32 Expected date:  Expected time:  Means of arrival:  Comments: EMS  Shortness of breath

## 2016-12-05 ENCOUNTER — Other Ambulatory Visit: Payer: Self-pay

## 2016-12-05 ENCOUNTER — Telehealth: Payer: Self-pay | Admitting: *Deleted

## 2016-12-05 ENCOUNTER — Encounter (HOSPITAL_COMMUNITY): Payer: Self-pay | Admitting: Internal Medicine

## 2016-12-05 ENCOUNTER — Ambulatory Visit: Payer: Commercial Managed Care - HMO | Admitting: Family

## 2016-12-05 DIAGNOSIS — J9601 Acute respiratory failure with hypoxia: Secondary | ICD-10-CM | POA: Diagnosis not present

## 2016-12-05 DIAGNOSIS — R0602 Shortness of breath: Secondary | ICD-10-CM | POA: Diagnosis not present

## 2016-12-05 DIAGNOSIS — J440 Chronic obstructive pulmonary disease with acute lower respiratory infection: Secondary | ICD-10-CM | POA: Diagnosis not present

## 2016-12-05 DIAGNOSIS — Z7189 Other specified counseling: Secondary | ICD-10-CM

## 2016-12-05 DIAGNOSIS — R0600 Dyspnea, unspecified: Secondary | ICD-10-CM | POA: Diagnosis present

## 2016-12-05 DIAGNOSIS — G4733 Obstructive sleep apnea (adult) (pediatric): Secondary | ICD-10-CM | POA: Diagnosis not present

## 2016-12-05 DIAGNOSIS — J9621 Acute and chronic respiratory failure with hypoxia: Secondary | ICD-10-CM | POA: Diagnosis not present

## 2016-12-05 DIAGNOSIS — Z515 Encounter for palliative care: Secondary | ICD-10-CM

## 2016-12-05 DIAGNOSIS — J9622 Acute and chronic respiratory failure with hypercapnia: Secondary | ICD-10-CM | POA: Diagnosis not present

## 2016-12-05 DIAGNOSIS — C3490 Malignant neoplasm of unspecified part of unspecified bronchus or lung: Secondary | ICD-10-CM

## 2016-12-05 DIAGNOSIS — J189 Pneumonia, unspecified organism: Secondary | ICD-10-CM | POA: Diagnosis not present

## 2016-12-05 DIAGNOSIS — R112 Nausea with vomiting, unspecified: Secondary | ICD-10-CM | POA: Diagnosis not present

## 2016-12-05 DIAGNOSIS — E1151 Type 2 diabetes mellitus with diabetic peripheral angiopathy without gangrene: Secondary | ICD-10-CM | POA: Diagnosis not present

## 2016-12-05 DIAGNOSIS — J9589 Other postprocedural complications and disorders of respiratory system, not elsewhere classified: Secondary | ICD-10-CM | POA: Diagnosis not present

## 2016-12-05 DIAGNOSIS — C3491 Malignant neoplasm of unspecified part of right bronchus or lung: Secondary | ICD-10-CM | POA: Diagnosis not present

## 2016-12-05 DIAGNOSIS — J441 Chronic obstructive pulmonary disease with (acute) exacerbation: Secondary | ICD-10-CM | POA: Diagnosis not present

## 2016-12-05 LAB — COMPREHENSIVE METABOLIC PANEL
ALT: 24 U/L (ref 17–63)
AST: 28 U/L (ref 15–41)
Albumin: 3.4 g/dL — ABNORMAL LOW (ref 3.5–5.0)
Alkaline Phosphatase: 81 U/L (ref 38–126)
Anion gap: 13 (ref 5–15)
BUN: 19 mg/dL (ref 6–20)
CHLORIDE: 95 mmol/L — AB (ref 101–111)
CO2: 24 mmol/L (ref 22–32)
CREATININE: 0.91 mg/dL (ref 0.61–1.24)
Calcium: 9.2 mg/dL (ref 8.9–10.3)
Glucose, Bld: 104 mg/dL — ABNORMAL HIGH (ref 65–99)
POTASSIUM: 4.4 mmol/L (ref 3.5–5.1)
SODIUM: 132 mmol/L — AB (ref 135–145)
Total Bilirubin: 1.5 mg/dL — ABNORMAL HIGH (ref 0.3–1.2)
Total Protein: 6.5 g/dL (ref 6.5–8.1)

## 2016-12-05 LAB — CBC WITH DIFFERENTIAL/PLATELET
Basophils Absolute: 0 10*3/uL (ref 0.0–0.1)
Basophils Relative: 0 %
Eosinophils Absolute: 0.2 10*3/uL (ref 0.0–0.7)
Eosinophils Relative: 1 %
HEMATOCRIT: 41.4 % (ref 39.0–52.0)
HEMOGLOBIN: 13.4 g/dL (ref 13.0–17.0)
LYMPHS ABS: 1.2 10*3/uL (ref 0.7–4.0)
LYMPHS PCT: 6 %
MCH: 28.6 pg (ref 26.0–34.0)
MCHC: 32.4 g/dL (ref 30.0–36.0)
MCV: 88.5 fL (ref 78.0–100.0)
MONOS PCT: 10 %
Monocytes Absolute: 2.1 10*3/uL — ABNORMAL HIGH (ref 0.1–1.0)
NEUTROS ABS: 17.2 10*3/uL — AB (ref 1.7–7.7)
NEUTROS PCT: 83 %
Platelets: 199 10*3/uL (ref 150–400)
RBC: 4.68 MIL/uL (ref 4.22–5.81)
RDW: 16.7 % — ABNORMAL HIGH (ref 11.5–15.5)
WBC: 20.8 10*3/uL — AB (ref 4.0–10.5)

## 2016-12-05 MED ORDER — MORPHINE SULFATE (PF) 2 MG/ML IV SOLN
1.0000 mg | INTRAVENOUS | Status: DC | PRN
  Administered 2016-12-05 – 2016-12-06 (×3): 2 mg via INTRAVENOUS
  Filled 2016-12-05 (×3): qty 1

## 2016-12-05 MED ORDER — LORAZEPAM 2 MG/ML IJ SOLN
0.5000 mg | Freq: Two times a day (BID) | INTRAMUSCULAR | Status: DC | PRN
  Administered 2016-12-05: 0.5 mg via INTRAVENOUS
  Filled 2016-12-05: qty 1

## 2016-12-05 MED ORDER — ACETAMINOPHEN 325 MG PO TABS
650.0000 mg | ORAL_TABLET | Freq: Four times a day (QID) | ORAL | Status: DC | PRN
  Filled 2016-12-05: qty 2

## 2016-12-05 MED ORDER — MORPHINE SULFATE 15 MG PO TABS
15.0000 mg | ORAL_TABLET | ORAL | Status: DC | PRN
  Administered 2016-12-06: 15 mg via ORAL
  Filled 2016-12-05: qty 1

## 2016-12-05 MED ORDER — GLYCOPYRROLATE 0.2 MG/ML IJ SOLN
0.4000 mg | INTRAMUSCULAR | Status: DC | PRN
  Administered 2016-12-05: 0.4 mg via INTRAVENOUS
  Filled 2016-12-05 (×2): qty 2

## 2016-12-05 MED ORDER — OLANZAPINE 5 MG PO TBDP
5.0000 mg | ORAL_TABLET | Freq: Every day | ORAL | Status: DC
  Administered 2016-12-05: 5 mg via ORAL
  Filled 2016-12-05 (×2): qty 1

## 2016-12-05 MED ORDER — BIOTENE DRY MOUTH MT LIQD: 15.0000 mL | OROMUCOSAL | Status: DC | PRN

## 2016-12-05 MED ORDER — IPRATROPIUM-ALBUTEROL 0.5-2.5 (3) MG/3ML IN SOLN
3.0000 mL | Freq: Four times a day (QID) | RESPIRATORY_TRACT | Status: DC
  Administered 2016-12-06 (×3): 3 mL via RESPIRATORY_TRACT
  Filled 2016-12-05 (×3): qty 3

## 2016-12-05 MED ORDER — DM-GUAIFENESIN ER 30-600 MG PO TB12
1.0000 | ORAL_TABLET | Freq: Two times a day (BID) | ORAL | Status: DC
  Administered 2016-12-05: 1 via ORAL
  Filled 2016-12-05: qty 1

## 2016-12-05 MED ORDER — LORAZEPAM 2 MG/ML IJ SOLN
0.5000 mg | Freq: Once | INTRAMUSCULAR | Status: AC
  Administered 2016-12-05: 0.5 mg via INTRAVENOUS
  Filled 2016-12-05: qty 1

## 2016-12-05 MED ORDER — MORPHINE SULFATE (PF) 2 MG/ML IV SOLN: 2.0000 mg | INTRAVENOUS | Status: DC | PRN

## 2016-12-05 MED ORDER — ONDANSETRON HCL 4 MG PO TABS
8.0000 mg | ORAL_TABLET | Freq: Four times a day (QID) | ORAL | Status: DC | PRN
  Administered 2016-12-05 – 2016-12-06 (×4): 8 mg via ORAL
  Filled 2016-12-05 (×5): qty 2

## 2016-12-05 MED ORDER — MOMETASONE FURO-FORMOTEROL FUM 200-5 MCG/ACT IN AERO
2.0000 | INHALATION_SPRAY | Freq: Two times a day (BID) | RESPIRATORY_TRACT | Status: DC
  Administered 2016-12-05 – 2016-12-06 (×3): 2 via RESPIRATORY_TRACT
  Filled 2016-12-05: qty 8.8

## 2016-12-05 MED ORDER — ACETAMINOPHEN 650 MG RE SUPP: 650.0000 mg | Freq: Four times a day (QID) | RECTAL | Status: DC | PRN

## 2016-12-05 MED ORDER — HALOPERIDOL LACTATE 5 MG/ML IJ SOLN
0.5000 mg | Freq: Three times a day (TID) | INTRAMUSCULAR | Status: DC
  Administered 2016-12-05 – 2016-12-06 (×2): 0.5 mg via INTRAVENOUS
  Filled 2016-12-05 (×2): qty 1

## 2016-12-05 MED ORDER — HALOPERIDOL LACTATE 5 MG/ML IJ SOLN
0.5000 mg | INTRAMUSCULAR | Status: DC | PRN
  Administered 2016-12-06: 0.5 mg via INTRAVENOUS

## 2016-12-05 MED ORDER — CHLORPROMAZINE HCL 25 MG/ML IJ SOLN
12.5000 mg | Freq: Four times a day (QID) | INTRAMUSCULAR | Status: DC | PRN
  Administered 2016-12-05: 12.5 mg via INTRAVENOUS
  Filled 2016-12-05 (×2): qty 0.5

## 2016-12-05 MED ORDER — HALOPERIDOL LACTATE 2 MG/ML PO CONC
0.5000 mg | ORAL | Status: DC | PRN
  Filled 2016-12-05: qty 0.3

## 2016-12-05 MED ORDER — ALBUTEROL SULFATE (2.5 MG/3ML) 0.083% IN NEBU
2.5000 mg | INHALATION_SOLUTION | RESPIRATORY_TRACT | Status: DC | PRN
  Administered 2016-12-05 – 2016-12-06 (×3): 2.5 mg via RESPIRATORY_TRACT
  Filled 2016-12-05 (×3): qty 3

## 2016-12-05 MED ORDER — LORAZEPAM 2 MG/ML IJ SOLN: 1.0000 mg | Freq: Two times a day (BID) | INTRAMUSCULAR | Status: DC

## 2016-12-05 MED ORDER — LORAZEPAM 2 MG/ML IJ SOLN
1.0000 mg | INTRAMUSCULAR | Status: DC | PRN
  Administered 2016-12-05: 1 mg via INTRAVENOUS
  Filled 2016-12-05: qty 1

## 2016-12-05 MED ORDER — HALOPERIDOL LACTATE 5 MG/ML IJ SOLN: 0.5000 mg | Freq: Four times a day (QID) | INTRAMUSCULAR | Status: DC | PRN

## 2016-12-05 MED ORDER — IPRATROPIUM-ALBUTEROL 0.5-2.5 (3) MG/3ML IN SOLN
3.0000 mL | RESPIRATORY_TRACT | Status: DC
  Administered 2016-12-05 (×4): 3 mL via RESPIRATORY_TRACT
  Filled 2016-12-05 (×5): qty 3

## 2016-12-05 MED ORDER — ENOXAPARIN SODIUM 40 MG/0.4ML ~~LOC~~ SOLN: 40.0000 mg | SUBCUTANEOUS | Status: DC

## 2016-12-05 MED ORDER — ONDANSETRON HCL 4 MG PO TABS: 4.0000 mg | ORAL_TABLET | Freq: Four times a day (QID) | ORAL | Status: DC | PRN

## 2016-12-05 MED ORDER — LORAZEPAM 2 MG/ML IJ SOLN
1.0000 mg | INTRAMUSCULAR | Status: DC | PRN
  Administered 2016-12-05 – 2016-12-06 (×3): 1 mg via INTRAVENOUS
  Filled 2016-12-05 (×3): qty 1

## 2016-12-05 MED ORDER — ONDANSETRON HCL 4 MG/2ML IJ SOLN
4.0000 mg | Freq: Four times a day (QID) | INTRAMUSCULAR | Status: DC | PRN
  Administered 2016-12-05 (×2): 4 mg via INTRAVENOUS
  Filled 2016-12-05 (×2): qty 2

## 2016-12-05 MED ORDER — HALOPERIDOL 0.5 MG PO TABS
0.5000 mg | ORAL_TABLET | ORAL | Status: DC | PRN
  Filled 2016-12-05: qty 1

## 2016-12-05 MED ORDER — SODIUM CHLORIDE 0.9 % IV SOLN
8.0000 mg | Freq: Four times a day (QID) | INTRAVENOUS | Status: DC | PRN
  Filled 2016-12-05: qty 4

## 2016-12-05 MED ORDER — ARFORMOTEROL TARTRATE 15 MCG/2ML IN NEBU
15.0000 ug | INHALATION_SOLUTION | Freq: Two times a day (BID) | RESPIRATORY_TRACT | Status: DC
  Administered 2016-12-05 – 2016-12-06 (×3): 15 ug via RESPIRATORY_TRACT
  Filled 2016-12-05 (×4): qty 2

## 2016-12-05 MED ORDER — GABAPENTIN ENACARBIL ER 300 MG PO TBCR: 1.0000 | EXTENDED_RELEASE_TABLET | Freq: Every day | ORAL | Status: DC

## 2016-12-05 MED ORDER — POLYVINYL ALCOHOL 1.4 % OP SOLN
1.0000 [drp] | Freq: Four times a day (QID) | OPHTHALMIC | Status: DC | PRN
  Filled 2016-12-05: qty 15

## 2016-12-05 NOTE — Consult Note (Addendum)
Consultation Note Date: 12/05/2016   Patient Name: Ernest [name]  DOB: 1955-12-12  MRN: [id number]  Age / Sex: 61 y.o., male  PCP: Ernest Lima, MD Referring Physician: Allie Bossier, MD  Reason for Consultation: Hospice Evaluation  HPI/Patient Profile: 61 y.o. 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  He was found to have post obstructive pneumonia.   He had a recent admission for the same in late December 2017. The patient and his wife Ernest [name] have requested comfort measures only.  Clinical Assessment and Goals of Care: I met with [name] and his wife at bedside.  Ernest [name] and his wife Ernest [name] live at home.  Ernest [name] works full time.  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  They had one child who has passed away.  Ernest [name] is extremely nauseated and having difficulty with refractory vomiting since 1/9.  We talked about his cancer and recent chemotherapy.  He refuses further chemotherapy.  He is unable to sleep due to SOB and vomiting.  I described hosp[facility] care and [facility].  Ernest [name] and Ernest [name] are requesting comfort measures only at Ernest [name].  Primary Decision Maker:  PATIENT    SUMMARY OF RECOMMENDATIONS    Code Status/Advance Care Planning:  DNR   Social work order placed for [employer].    Symptom Management:   Added Olanzepine QHS for nausea and sleep  Added Haldol scheduled for nausea  Increased PRN ativan  Increased frequency of morphine for dyspnea  Robinul PRN for secretions   Psycho-social/Spiritual:   Desire for further Chaplaincy support: Yes  Prognosis:   < 2 weeks secondary to disabling dyspnea at rest, stage 4 metastatic lung cancer, post  obstructive pneumonia refractory to [name] treatment in Dec.  Discharge Planning: Hospice facility      Primary Diagnoses: Present on Admission: . Dyspnea . Non-small cell carcinoma of lung, right (Ernest [name]) . Claudication (Ernest [name]) . Chronic respiratory failure with hypercapnia (Ernest [name]) . Acute respiratory failure with hypoxia and hypercapnia (Ernest [name]) . OSA (obstructive sleep apnea) . Acute respiratory failure with hypoxia (Ernest [name])   I have reviewed the medical record, interviewed the patient and family, and examined the patient. The following aspects are pertinent.  Past Medical History:  Diagnosis Date  . Acute bronchitis 11/05/2016  . Anxiety   . CAD (coronary artery disease)    Left Main 30% stenosis, LAD 20 - 30 % stenosis, first and second diagonal branchesat 40 - 50%  stenosis with small arteries, circumflex had 30% stenosis in the large obtuse marginal, RCA at 70 - 80%  stenosis [not felt to be occlusive after evaluation with flow wire], distal 50 - 60% stenosis - James Ernest [name][  . Chronic insomnia   . COPD with asthma ([place]) 09/08/2007  . DDD (degenerative disc disease)   .  Depression   . Diabetes mellitus without complication ([place]) 2/35/5732   no medications now 07/24/2016  . GERD (gastroesophageal reflux disease)   . Gout   . History of cardiovascular stress test    Myoview 7/16:  Diaphragmatic attenuation, no ischemia, EF 56%; Low Risk  . History of radiation therapy 11/10/13- 12/29/13   left lung 6600 cGy in 33 sessions  . Hx of colonoscopy   . Hyperlipidemia   . Hypertension    dr Ernest [name]  . Itching due to drug 03/19/2016  . Lung cancer ([place]) 10/04/13   LUL squamous cell lung cancer  . Lung cancer ([place])   . Nausea with vomiting 03/26/2016  . OSA (obstructive sleep apnea)    NPSG 09/10/10- AHI 11.3/hr  . PVD (peripheral vascular disease) ([place])    PTA/Stent right common iliac   Social History   Social History  . Marital status: Married    Spouse name: N/A  . Number of  children: N/A  . Years of education: N/A   Occupational History  . Disabled welder Unemployed   Social History Main Topics  . Smoking status: Former Smoker    Packs/day: 0.00    Years: 43.00    Types: Cigarettes    Quit date: 09/23/2013  . Smokeless tobacco: Never Used     Comment: history of 3 PPD, 11/02/13   . Alcohol use No  . Drug use: No  . Sexual activity: No   Other Topics Concern  . None   Social History Narrative   Lives with wife in a one story home.  Has no children.  On disability.  Used to work as a Building control surveyor.  Education: 11th grade.    Family History  Problem Relation Age of Onset  . Heart attack Mother   . Heart attack Sister   . Lung cancer Sister   . Cancer Sister     small cell lung, mets to brain  . Hypertension Brother   . Emphysema Sister   . Stroke Neg Hx    Scheduled Meds: . arformoterol  15 mcg Nebulization BID  . dextromethorphan-guaiFENesin  1 tablet Oral BID  . enoxaparin (LOVENOX) injection  40 mg Subcutaneous Q24H  . Gabapentin Enacarbil ER  1 capsule Oral Daily  . ipratropium-albuterol  3 mL Nebulization Q4H  . mometasone-formoterol  2 puff Inhalation BID   Continuous Infusions: PRN Meds:.acetaminophen **OR** acetaminophen, albuterol, chlorproMAZINE (THORAZINE) IV, LORazepam, morphine, morphine injection, ondansetron **OR** ondansetron (ZOFRAN) IV Allergies  Allergen Reactions  . Carboplatin Shortness Of Breath, Swelling and Rash    Swelling of lips, rash on face,eyes and head   Review of Systems Patient is too nauseated and short of breath to provide a review of symptoms  Physical Exam  Constitutional: He is oriented to person, place, and time.  Well developed male, nauseated, weak, doubled over in bed.  HENT:  Head: Normocephalic and atraumatic.  Head is flushed  Eyes: EOM are normal. No scleral icterus.  Neck: Neck supple.  Cardiovascular: Normal rate and regular rhythm.   Pulmonary/Chest: He is in respiratory distress. He has  wheezes. He has rales.  Moderately increased work of breathing.  Musculoskeletal: He exhibits no edema or deformity.  Neurological: He is alert and oriented to person, place, and time.  Skin: Skin is warm and dry.  Psychiatric: He has a normal mood and affect. His behavior is normal. Judgment and thought content normal.     Vital Signs: BP 138/87 (BP Location: Left Arm)  Pulse (!) 109   Temp 98.2 F (36.8 C) (Oral)   Resp (!) 22   Ht 5' 6"  (1.676 m)   Wt 66.8 kg (147 lb 4.3 oz)   SpO2 100%   BMI 23.77 kg/m  Pain Assessment: No/denies pain   Pain Score: 0-No pain   SpO2: SpO2: 100 % O2 Device:SpO2: 100 % O2 Flow Rate: .O2 Flow Rate (L/min): 3 L/min  IO: Intake/output summary:  Intake/Output Summary (Last 24 hours) at 12/05/16 1305 Last data filed at 12/05/16 1108  Gross per 24 hour  Intake              120 ml  Output              350 ml  Net             -230 ml    LBM: Last BM Date: 12/04/16 Baseline Weight: Weight: 73 kg (161 lb) Most recent weight: Weight: 66.8 kg (147 lb 4.3 oz)     Palliative Assessment/Data:   Flowsheet Rows   Flowsheet Row Most Recent Value  Intake Tab  Referral Department  Hospitalist  Unit at Time of Referral  Med/Surg Unit  Palliative Care Primary Diagnosis  Cancer  Date Notified  12/05/16  Palliative Care Type  [facility] Palliative care  Reason for referral  Counsel Regarding Hospice, Psychosocial or Spiritual support, End of Life Care Assistance  Date of Admission  12/04/16  Date first seen by Palliative Care  12/05/16  # of days Palliative referral response time  0 Day(s)  # of days IP prior to Palliative referral  1  Clinical Assessment  Palliative Performance Scale Score  20%  Psychosocial & Spiritual Assessment  Palliative Care Outcomes  Patient/Family meeting held?  Yes  Who was at the meeting?  patient and wife  Palliative Care Outcomes  Improved pain interventions, Improved non-pain symptom therapy, Clarified goals of care,  Transitioned to hospice      Time In:  2:00 Time Out: 3:10 Time Total: 70 min. Greater than 50%  of this time was spent counseling and coordinating care related to the above assessment and plan.  Signed by: Imogene Burn, PA-C Palliative Medicine Pager: [phone]  Please contact Palliative Medicine Team phone at [phone] for questions and concerns.  For individual provider: See Shea Evans

## 2016-12-05 NOTE — H&P (Signed)
History and Physical    Ernest [name] RDE:[id number] DOB: 1955/12/12 DOA: 12/04/2016  PCP: [name], MD  Patient coming from: Home.  Chief Complaint: Shortness of breath nausea vomiting.  HPI: Ernest [name] is a 61 y.o. male with metastatic non-small cell lung cancer, COPD, hypertension, diabetes mellitus was brought to the ER after had persistent vomiting with shortness of breath and twitching of upper extremity muscles. In the ER patient was found to be short of breath labs show leukocytosis. Chest x-ray shows possible postobstructive pneumonia. On exam patient also has wheezing. At this time patient and patient's wife are only requesting comfort measures and they're planning to be admitted to hospice. Patient will be admitted for comfort measures.   ED Course: See history of presenting illness.  Review of Systems: As per HPI, rest all negative.   Past Medical History:  Diagnosis Date  . Acute bronchitis 11/05/2016  . Anxiety   . CAD (coronary artery disease)    Left Main 30% stenosis, LAD 20 - 30 % stenosis, first and second diagonal branchesat 40 - 50%  stenosis with small arteries, circumflex had 30% stenosis in the large obtuse marginal, RCA at 70 - 80%  stenosis [not felt to be occlusive after evaluation with flow wire], distal 50 - 60% stenosis - [name][  . Chronic insomnia   . COPD with asthma ([name]) 09/08/2007  . DDD (degenerative disc disease)   . Depression   . Diabetes mellitus without complication ([place]) 6/31/4970   no medications now 07/24/2016  . GERD (gastroesophageal reflux disease)   . Gout   . History of cardiovascular stress test    Myoview 7/16:  Diaphragmatic attenuation, no ischemia, EF 56%; Low Risk  . History of radiation therapy 11/10/13- 12/29/13   left lung 6600 cGy in 33 sessions  . Hx of colonoscopy   . Hyperlipidemia   . Hypertension    dr Percival Spanish  . Itching due to drug 03/19/2016  . Lung cancer ([place]) 10/04/13   LUL  squamous cell lung cancer  . Lung cancer ([place])   . Nausea with vomiting 03/26/2016  . OSA (obstructive sleep apnea)    NPSG 09/10/10- AHI 11.3/hr  . PVD (peripheral vascular disease) ([place])    PTA/Stent right common iliac    Past Surgical History:  Procedure Laterality Date  . ANGIOPLASTY    . ANTERIOR FUSION CERVICAL SPINE     cervical fusion  7 yrs ago ([facility])  . arm surgery     left elbow  . BACK SURGERY     lower  . c-spine surgery    . COLON SURGERY     '11 "Diverticulitis"  . COLONOSCOPY WITH PROPOFOL N/A 05/22/2015   Procedure: COLONOSCOPY WITH PROPOFOL;  Surgeon: Inda Castle, MD;  Location: [facility];  Service: Endoscopy;  Laterality: N/A;  . HIP SURGERY     left 'bone graft taken"  . PERIPHERAL VASCULAR CATHETERIZATION N/A 08/10/2015   Procedure: Lower Extremity Angiography;  Surgeon: Lorretta Harp, MD; Distal Ao OK, L-EIA stent OK, R-CIA 100% s/p overlapping 7 mm x 38 mm ICast stents, 50-60% R-CFA      . SHOULDER SURGERY     right  . SPINAL FUSION  03/05/2007   L4-L5  . VIDEO BRONCHOSCOPY WITH ENDOBRONCHIAL NAVIGATION N/A 10/04/2013   Procedure: VIDEO BRONCHOSCOPY WITH ENDOBRONCHIAL NAVIGATION;  Surgeon: Collene Gobble, MD;  Location: [facility];  Service: Thoracic;  Laterality: N/A;     reports that he quit  smoking about 3 years ago. His smoking use included Cigarettes. He smoked 0.00 packs per day for 43.00 years. He has never used smokeless tobacco. He reports that he does not drink alcohol or use drugs.  Allergies  Allergen Reactions  . Carboplatin Shortness Of Breath, Swelling and Rash    Swelling of lips, rash on face,eyes and head    Family History  Problem Relation Age of Onset  . Heart attack Mother   . Heart attack Sister   . Lung cancer Sister   . Cancer Sister     small cell lung, mets to brain  . Hypertension Brother   . Emphysema Sister   . Stroke Neg Hx     Prior to Admission medications   Medication Sig Start Date End Date Taking?  Authorizing Provider  ADVAIR DISKUS 500-50 MCG/DOSE AEPB 1 PUFF THEN RINSE MOUTH, TWICE DAILY MAINTENANCE 04/26/16  Yes Deneise Lever, MD  albuterol (PROVENTIL) (2.5 MG/3ML) 0.083% nebulizer solution INHALE 1 VIAL IN NEBULIZER EVERY 4 HOURS AS NEEDED FOR WHEEZING OR SHORTNESS OF BREATH 11/08/16  Yes Deneise Lever, MD  ALPRAZolam Duanne Moron) 0.5 MG tablet Take 0.5-1 mg by mouth every 4 (four) hours as needed (for breathing-related anxiety).   Yes Historical Provider, MD  arformoterol (BROVANA) 15 MCG/2ML NEBU Take 2 mLs (15 mcg total) by nebulization 2 (two) times daily. 11/06/16  Yes Deneise Lever, MD  ARIPiprazole (ABILIFY) 5 MG tablet Take 5 mg by mouth every morning. 11/21/16  Yes Historical Provider, MD  Armodafinil 250 MG tablet Take 250 mg by mouth every morning. 03/08/16  Yes Historical Provider, MD  aspirin 81 MG EC tablet TAKE 1 TABLET BY MOUTH EVERY DAY 07/07/16  Yes Janith Lima, MD  atorvastatin (LIPITOR) 20 MG tablet TAKE 1 TABLET EVERY DAY Patient taking differently: TAKE 20 MG  EVERY DAY IN THE EVENING 08/01/15  Yes Dorena Cookey, MD  ciclopirox (LOPROX) 0.77 % cream Apply topically 2 (two) times daily. 10/31/16  Yes Janith Lima, MD  clonazePAM (KLONOPIN) 1 MG tablet Take 2 mg by mouth at bedtime as needed for anxiety (or sleep). Reported on 01/16/2016   Yes Historical Provider, MD  dexamethasone (DECADRON) 4 MG tablet 2 tablets by mouth twice a day the day before, day of and day after the chemotherapy every 3 weeks 09/17/16  Yes Curt Bears, MD  dextromethorphan-guaiFENesin [employer] DM) 30-600 MG 12hr tablet Take 1 tablet by mouth 2 (two) times daily as needed for cough (congestion).   Yes Historical Provider, MD  esomeprazole (NEXIUM) 40 MG capsule TAKE ONE CAPSULE BY MOUTH EVERY DAY Patient taking differently: TAKE 40 MG  BY MOUTH TWICE DAILY. 05/06/16  Yes Janith Lima, MD  feeding supplement, ENSURE ENLIVE, (ENSURE ENLIVE) LIQD Take 237 mLs by mouth 2 (two) times daily  between meals. 05/23/16  Yes Nishant Dhungel, MD  flunisolide (NASALIDE) 25 MCG/ACT (0.025%) SOLN Place 1 spray into the nose daily as needed for allergies. 10/23/16  Yes Historical Provider, MD  FLUoxetine (PROZAC) 40 MG capsule Take 40 mg by mouth every morning.   Yes Historical Provider, MD  Gabapentin Enacarbil ER (HORIZANT) 300 MG TBCR Take 1 capsule by mouth daily. 09/13/16  Yes Janith Lima, MD  glipiZIDE (GLUCOTROL) 5 MG tablet Take 2.5 mg by mouth daily.  10/16/16  Yes Historical Provider, MD  ipratropium-albuterol (DUONEB) 0.5-2.5 (3) MG/3ML SOLN Take 3 mLs by nebulization every 6 (six) hours as needed. Patient taking differently: Take 3 mLs  by nebulization every 6 (six) hours as needed (SOB).  11/20/16  Yes Deneise Lever, MD  KLOR-CON M20 20 MEQ tablet TAKE 1 TABLET BY MOUTH EVERY MORNING 07/22/16  Yes Janith Lima, MD  LORazepam (ATIVAN) 0.5 MG tablet Take 1 tablet (0.5 mg total) by mouth 2 (two) times daily as needed for anxiety. Do not take when taking Klonopin. 12/04/16  Yes Curt Bears, MD  magic mouthwash SOLN Use as directed 5 mLs in the mouth or throat 4 (four) times daily as needed. 10/18/16  Yes Historical Provider, MD  Methylnaltrexone Bromide (RELISTOR) 150 MG TABS Take 3 tablets by mouth every morning. 07/18/16  Yes Janith Lima, MD  metoprolol succinate (TOPROL-XL) 25 MG 24 hr tablet Take 12.5 mg by mouth daily.   Yes Historical Provider, MD  mirtazapine (REMERON) 45 MG tablet Take 1 tablet (45 mg total) by mouth at bedtime. 12/29/14  Yes Tammy S Parrett, NP  morphine (MS CONTIN) 30 MG 12 hr tablet Take 1 tablet (30 mg total) by mouth every 12 (twelve) hours. 09/24/16  Yes Janith Lima, MD  morphine (MSIR) 15 MG tablet Take 15 mg by mouth every 4 (four) hours as needed for severe pain.  10/31/16  Yes Historical Provider, MD  nitroGLYCERIN (NITROSTAT) 0.4 MG SL tablet Place 1 tablet (0.4 mg total) under the tongue every 5 (five) minutes as needed for chest pain. 08/09/14   Yes Minus Breeding, MD  ondansetron (ZOFRAN) 8 MG tablet TAKE 1 TABLET (8 MG TOTAL) BY MOUTH EVERY 8 (EIGHT) HOURS AS NEEDED FOR NAUSEA OR VOMITING. 12/04/16  Yes Curt Bears, MD  pantoprazole (PROTONIX) 40 MG tablet Take 40 mg by mouth daily.   Yes Historical Provider, MD  predniSONE (DELTASONE) 5 MG tablet Take 5 mg by mouth daily. 11/01/16  Yes Historical Provider, MD  prochlorperazine (COMPAZINE) 10 MG tablet Take 1 tablet (10 mg total) by mouth every 6 (six) hours as needed for nausea or vomiting. 12/04/16  Yes Curt Bears, MD  roflumilast (DALIRESP) 500 MCG TABS tablet Take 500 mcg by mouth daily.   Yes Historical Provider, MD  tamsulosin (FLOMAX) 0.4 MG CAPS capsule Take 0.4 mg by mouth daily.   Yes Historical Provider, MD  theophylline (UNIPHYL) 400 MG 24 hr tablet Take 1 tablet (400 mg total) by mouth daily. 08/12/16  Yes Deneise Lever, MD  Tiotropium Bromide Monohydrate (SPIRIVA RESPIMAT) 1.25 MCG/ACT AERS Inhale 2 puffs into the lungs daily. 04/16/16  Yes Janith Lima, MD  VENTOLIN HFA 108 (90 Base) MCG/ACT inhaler INHALE 2 PUFFS INTO THE LUNGS 4 TIMES DAILY AS NEEDED FOR WHEEZING 06/03/16  Yes Deneise Lever, MD  fluconazole (DIFLUCAN) 100 MG tablet Take 1 tablet (100 mg total) by mouth daily. Patient not taking: Reported on 12/05/2016 10/22/16   Susanne Borders, NP  OXYGEN Inhale 2 L into the lungs daily.     Historical Provider, MD    Physical Exam: Vitals:   12/04/16 2303 12/05/16 0002 12/05/16 0057 12/05/16 0147  BP:   129/74 125/73  Pulse:   117 111  Resp:   18 20  Temp:      TempSrc:      SpO2:  95% 93% 94%  Weight: 73 kg (161 lb)     Height: '5\' 6"'$  (1.676 m)         Constitutional: Moderately built and nourished in moderate distress. Vitals:   12/04/16 2303 12/05/16 0002 12/05/16 0057 12/05/16 0147  BP:  129/74 125/73  Pulse:   117 111  Resp:   18 20  Temp:      TempSrc:      SpO2:  95% 93% 94%  Weight: 73 kg (161 lb)     Height: '5\' 6"'$  (1.676 m)       Eyes: Anicteric no pallor. ENMT: No discharge from the ears eyes nose and mouth. Neck: No mass felt. No neck rigidity. Respiratory: Bilateral expiratory wheezes heard. Cardiovascular: S1 and S2 heard. Abdomen: Soft nontender bowel sounds present. Musculoskeletal: No edema. No joint effusion. Skin: No rash. Skin appears warm. Neurologic: Alert awake oriented to time place and person. Moves all extremities. Psychiatric: Appears normal. Normal affect.   Labs on Admission: I have personally reviewed following labs and imaging studies  CBC:  Recent Labs Lab 12/05/16 0001  WBC 20.8*  NEUTROABS 17.2*  HGB 13.4  HCT 41.4  MCV 88.5  PLT 102   Basic Metabolic Panel:  Recent Labs Lab 12/05/16 0001  NA 132*  K 4.4  CL 95*  CO2 24  GLUCOSE 104*  BUN 19  CREATININE 0.91  CALCIUM 9.2   GFR: Estimated Creatinine Clearance: 76.9 mL/min (by C-G formula based on SCr of 0.91 mg/dL). Liver Function Tests:  Recent Labs Lab 12/05/16 0001  AST 28  ALT 24  ALKPHOS 81  BILITOT 1.5*  PROT 6.5  ALBUMIN 3.4*   No results for input(s): LIPASE, AMYLASE in the last 168 hours. No results for input(s): AMMONIA in the last 168 hours. Coagulation Profile: No results for input(s): INR, PROTIME in the last 168 hours. Cardiac Enzymes: No results for input(s): CKTOTAL, CKMB, CKMBINDEX, TROPONINI in the last 168 hours. BNP (last 3 results) No results for input(s): PROBNP in the last 8760 hours. HbA1C: No results for input(s): HGBA1C in the last 72 hours. CBG: No results for input(s): GLUCAP in the last 168 hours. Lipid Profile: No results for input(s): CHOL, HDL, LDLCALC, TRIG, CHOLHDL, LDLDIRECT in the last 72 hours. Thyroid Function Tests: No results for input(s): TSH, T4TOTAL, FREET4, T3FREE, THYROIDAB in the last 72 hours. Anemia Panel: No results for input(s): VITAMINB12, FOLATE, FERRITIN, TIBC, IRON, RETICCTPCT in the last 72 hours. Urine analysis:    Component Value  Date/Time   COLORURINE AMBER (A) 05/18/2016 1310   APPEARANCEUR CLEAR 05/18/2016 1310   LABSPEC 1.019 05/18/2016 1310   PHURINE 7.0 05/18/2016 1310   GLUCOSEU NEGATIVE 05/18/2016 1310   GLUCOSEU NEGATIVE 10/05/2015 0958   HGBUR NEGATIVE 05/18/2016 1310   BILIRUBINUR SMALL (A) 05/18/2016 1310   BILIRUBINUR n 08/14/2011 1449   KETONESUR 15 (A) 05/18/2016 1310   PROTEINUR Negative 10/22/2016 1137   UROBILINOGEN 0.2 10/05/2015 0958   NITRITE NEGATIVE 05/18/2016 1310   LEUKOCYTESUR NEGATIVE 05/18/2016 1310   Sepsis Labs: '@LABRCNTIP'$ (procalcitonin:4,lacticidven:4) )No results found for this or any previous visit (from the past 240 hour(s)).   Radiological Exams on Admission: Dg Chest Port 1 View  Result Date: 12/05/2016 CLINICAL DATA:  Acute onset of worsening shortness of breath. Initial encounter. EXAM: PORTABLE CHEST 1 VIEW COMPARISON:  Chest radiograph performed 11/14/2016, and CT of the chest performed 11/11/2016 FINDINGS: The patient's large right lung and mediastinal masses are again noted, with redistribution of surrounding right basilar airspace opacification, concerning for persistent superimposed postobstructive pneumonia. There is no evidence of pleural effusion or pneumothorax. Chronic left apical scarring is noted. The cardiomediastinal silhouette is enlarged, reflecting the mediastinal mass. No acute osseous abnormalities are seen. Cervical spinal fusion hardware is noted. IMPRESSION: Redistribution  of right basilar airspace opacification, concerning for persistent superimposed postobstructive pneumonia, with underlying large right lung and mediastinal masses again seen. Electronically Signed   By: Garald Balding M.D.   On: 12/05/2016 00:09      Assessment/Plan Active Problems:   Non-small cell carcinoma of lung, right (HCC)   Chronic respiratory failure with hypercapnia (HCC)   Claudication (HCC)   Acute respiratory failure with hypoxia and hypercapnia (HCC)   OSA  (obstructive sleep apnea)   Dyspnea   Acute respiratory failure with hypoxia ([place])    1. Acute respiratory failure with hypoxia secondary to postobstructive pneumonia COPD and lung cancer. 2. Nausea vomiting - cause not clear could be from gastroparesis. 3. Diabetes mellitus type 2. 4. History of sleep apnea.  Plan - at this time patient and patient's wife has requested only comfort measures. They did not want any further labs or continue home medications. Okay with continuing nebulizer and morphine. I have also placed on when necessary IV morphine for breakthrough pain. I have requested palliative care consult. Patient is a DO NOT RESUSCITATE.   DVT prophylaxis: Lovenox. Code Status: DO NOT RESUSCITATE.  Family Communication: Patient's wife.  Disposition Plan: To be determined.  Consults called: Palliative care.  Admission status: Observation.    Rise Patience MD [facility] Pager [phone].  If 7PM-7AM, please contact night-coverage [email or web address] Password [email or web address]  12/05/2016, 3:00 AM

## 2016-12-05 NOTE — Progress Notes (Signed)
Pt does not want to do chest vest. He says he feels nauseated all the time. Flutter is at bedside.

## 2016-12-05 NOTE — Progress Notes (Signed)
PROGRESS NOTE    Ernest [name]  KDX:[id number] DOB: 1956/01/05 DOA: 12/04/2016 PCP: [name], MD     Brief Narrative:  61 y.o. WM PMHx Depression,Anxiety, Metastatic Non-Small Cell Lung Cancer, COPD, HTN, HLD, CAD native artery,, DM type II, OSA  Brought to the ER after had persistent vomiting with shortness of breath and twitching of upper extremity muscles. In the ER patient was found to be short of breath labs show leukocytosis. Chest x-ray shows possible postobstructive pneumonia. On exam patient also has wheezing. At this time patient and patient's wife are only requesting comfort measures and they're planning to be admitted to hospice. Patient will be admitted for comfort measures.     Subjective: 1/11 A/O 4, positive refractory N/V, positive SOB, negative CP.    Assessment & Plan:   Principal Problem:   Acute respiratory failure with hypoxia (HCC) Active Problems:   Non-small cell carcinoma of lung, right (HCC)   Chronic respiratory failure with hypercapnia (HCC)   Claudication (HCC)   Acute respiratory failure with hypoxia and hypercapnia (HCC)   OSA (obstructive sleep apnea)   Dyspnea   Acute on Chronic respiratory failure with Hypoxia/Postobstructive PNA -Postobstructive pneumonia secondary to Metastatic NSCLC  -Patient requests no labs, diagnostic testing. Requests to be made comfortable. -Awaiting consult from [name] oncology. Mrs Pizzo contacted oncology office yesterday and requested visit by [name].   OSA -CPAP per respiratory  DM type II  Refractory N/V -Thorazine 12.5 mg IV PRN   DVT prophylaxis: Lovenox Code Status: DO NOT RESUSCITATE Family Communication: Wife at bedside discussed plan of care Disposition Plan: Requesting Hospice options in [place] area   Consultants:  Palliative care pending  Procedures/Significant Events:    VENTILATOR SETTINGS:    Cultures   Antimicrobials: Anti-infectives    None        Devices    LINES / TUBES:      Continuous Infusions:   Objective: Vitals:   12/05/16 0545 12/05/16 0749 12/05/16 0750 12/05/16 0751  BP: 138/87     Pulse: (!) 109     Resp: (!) 22     Temp: 98.2 F (36.8 C)     TempSrc: Oral     SpO2: 97% 100% 100% 100%  Weight: 66.8 kg (147 lb 4.3 oz)     Height: '5\' 6"'$  (1.676 m)       Intake/Output Summary (Last 24 hours) at 12/05/16 1021 Last data filed at 12/05/16 0529  Gross per 24 hour  Intake                0 ml  Output              350 ml  Net             -350 ml   Filed Weights   12/04/16 2303 12/05/16 0545  Weight: 73 kg (161 lb) 66.8 kg (147 lb 4.3 oz)    Examination:  General: A/O 4, positive acute on chronic respiratory distress Eyes: negative scleral hemorrhage, negative anisocoria, negative icterus ENT: Negative Runny nose, negative gingival bleeding, Neck:  Negative scars, masses, torticollis, lymphadenopathy, JVD Lungs: tachypnea diffuse rhonchi  without wheezes or crackles Cardiovascular: Tachycardic, Regularrhythm without murmur gallop or rub normal S1 and S2 Abdomen: negative abdominal pain, nondistended, positive soft, bowel sounds, no rebound, no ascites, no appreciable mass Extremities: No significant cyanosis, clubbing, or edema bilateral lower extremities Skin: Negative rashes, lesions, ulcers Psychiatric:  Negative depression, negative anxiety, negative fatigue, negative  mania  Central nervous system:  Cranial nerves II through XII intact, tongue/uvula midline, all extremities muscle strength 5/5, sensation intact throughout, f negative dysarthria, negative expressive aphasia, negative receptive aphasia.  .     Data Reviewed: Care during the described time interval was provided by me .  I have reviewed this patient's available data, including medical history, events of note, physical examination, and all test results as part of my evaluation. I have personally reviewed and interpreted all  radiology studies.  CBC:  Recent Labs Lab 12/05/16 0001  WBC 20.8*  NEUTROABS 17.2*  HGB 13.4  HCT 41.4  MCV 88.5  PLT 106   Basic Metabolic Panel:  Recent Labs Lab 12/05/16 0001  NA 132*  K 4.4  CL 95*  CO2 24  GLUCOSE 104*  BUN 19  CREATININE 0.91  CALCIUM 9.2   GFR: Estimated Creatinine Clearance: 76.9 mL/min (by C-G formula based on SCr of 0.91 mg/dL). Liver Function Tests:  Recent Labs Lab 12/05/16 0001  AST 28  ALT 24  ALKPHOS 81  BILITOT 1.5*  PROT 6.5  ALBUMIN 3.4*   No results for input(s): LIPASE, AMYLASE in the last 168 hours. No results for input(s): AMMONIA in the last 168 hours. Coagulation Profile: No results for input(s): INR, PROTIME in the last 168 hours. Cardiac Enzymes: No results for input(s): CKTOTAL, CKMB, CKMBINDEX, TROPONINI in the last 168 hours. BNP (last 3 results) No results for input(s): PROBNP in the last 8760 hours. HbA1C: No results for input(s): HGBA1C in the last 72 hours. CBG: No results for input(s): GLUCAP in the last 168 hours. Lipid Profile: No results for input(s): CHOL, HDL, LDLCALC, TRIG, CHOLHDL, LDLDIRECT in the last 72 hours. Thyroid Function Tests: No results for input(s): TSH, T4TOTAL, FREET4, T3FREE, THYROIDAB in the last 72 hours. Anemia Panel: No results for input(s): VITAMINB12, FOLATE, FERRITIN, TIBC, IRON, RETICCTPCT in the last 72 hours. Sepsis Labs: No results for input(s): PROCALCITON, LATICACIDVEN in the last 168 hours.  No results found for this or any previous visit (from the past 240 hour(s)).       Radiology Studies: Dg Chest Port 1 View  Result Date: 12/05/2016 CLINICAL DATA:  Acute onset of worsening shortness of breath. Initial encounter. EXAM: PORTABLE CHEST 1 VIEW COMPARISON:  Chest radiograph performed 11/14/2016, and CT of the chest performed 11/11/2016 FINDINGS: The patient's large right lung and mediastinal masses are again noted, with redistribution of surrounding right  basilar airspace opacification, concerning for persistent superimposed postobstructive pneumonia. There is no evidence of pleural effusion or pneumothorax. Chronic left apical scarring is noted. The cardiomediastinal silhouette is enlarged, reflecting the mediastinal mass. No acute osseous abnormalities are seen. Cervical spinal fusion hardware is noted. IMPRESSION: Redistribution of right basilar airspace opacification, concerning for persistent superimposed postobstructive pneumonia, with underlying large right lung and mediastinal masses again seen. Electronically Signed   By: Garald Balding M.D.   On: 12/05/2016 00:09        Scheduled Meds: . arformoterol  15 mcg Nebulization BID  . enoxaparin (LOVENOX) injection  40 mg Subcutaneous Q24H  . Gabapentin Enacarbil ER  1 capsule Oral Daily  . ipratropium-albuterol  3 mL Nebulization Q4H  . mometasone-formoterol  2 puff Inhalation BID   Continuous Infusions:   LOS: 0 days    Time spent:40 min    WOODS, Geraldo Docker, MD [facility] Pager [phone]  If 7PM-7AM, please contact night-coverage [email or web address] Password [facility] 12/05/2016, 10:21 AM

## 2016-12-05 NOTE — Telephone Encounter (Signed)
Call from pt's wife reporting he has been admitted to [facility]. She reports Dr. Ronnald Ramp had previously enrolled pt in hospice services for COPD, asked if [name] should also refer to hospice for cancer care. Explained only one referral is needed. Instructed her to make hospice RN aware of current status and hospitalization as well.  Will make [name] aware.

## 2016-12-05 NOTE — Progress Notes (Signed)
Pt admitted to 1528, pt is alertx4, vital signs unremarkable, wife is at the bedside. Staff will continue to monitor pt---Esbeidy Mclaine, rn

## 2016-12-05 NOTE — Progress Notes (Signed)
Nutrition Brief Note  Pt identified as at nutrition risk on the Malnutrition Screen Tool.   Chart reviewed. Pt's goals are now comfort care.  No nutrition interventions warranted at this time.  Please consult as needed.   Clayton Bibles, MS, RD, LDN Pager: [phone] After Hours Pager: [phone]

## 2016-12-06 DIAGNOSIS — Z515 Encounter for palliative care: Secondary | ICD-10-CM | POA: Diagnosis not present

## 2016-12-06 DIAGNOSIS — J9600 Acute respiratory failure, unspecified whether with hypoxia or hypercapnia: Secondary | ICD-10-CM | POA: Diagnosis not present

## 2016-12-06 DIAGNOSIS — C3491 Malignant neoplasm of unspecified part of right bronchus or lung: Secondary | ICD-10-CM | POA: Diagnosis not present

## 2016-12-06 DIAGNOSIS — G4733 Obstructive sleep apnea (adult) (pediatric): Secondary | ICD-10-CM | POA: Diagnosis not present

## 2016-12-06 DIAGNOSIS — J449 Chronic obstructive pulmonary disease, unspecified: Secondary | ICD-10-CM | POA: Diagnosis not present

## 2016-12-06 DIAGNOSIS — J9621 Acute and chronic respiratory failure with hypoxia: Secondary | ICD-10-CM | POA: Diagnosis not present

## 2016-12-06 DIAGNOSIS — R0602 Shortness of breath: Secondary | ICD-10-CM | POA: Diagnosis not present

## 2016-12-06 DIAGNOSIS — R112 Nausea with vomiting, unspecified: Secondary | ICD-10-CM | POA: Diagnosis not present

## 2016-12-06 DIAGNOSIS — J189 Pneumonia, unspecified organism: Secondary | ICD-10-CM | POA: Diagnosis not present

## 2016-12-06 DIAGNOSIS — E1111 Type 2 diabetes mellitus with ketoacidosis with coma: Secondary | ICD-10-CM

## 2016-12-06 DIAGNOSIS — C3490 Malignant neoplasm of unspecified part of unspecified bronchus or lung: Secondary | ICD-10-CM | POA: Diagnosis not present

## 2016-12-06 DIAGNOSIS — J9601 Acute respiratory failure with hypoxia: Secondary | ICD-10-CM | POA: Diagnosis not present

## 2016-12-06 DIAGNOSIS — Z794 Long term (current) use of insulin: Secondary | ICD-10-CM

## 2016-12-06 DIAGNOSIS — C3492 Malignant neoplasm of unspecified part of left bronchus or lung: Secondary | ICD-10-CM

## 2016-12-06 DIAGNOSIS — R531 Weakness: Secondary | ICD-10-CM | POA: Diagnosis not present

## 2016-12-06 MED ORDER — GLYCOPYRROLATE 0.2 MG/ML IJ SOLN: 0.4000 mg | INTRAMUSCULAR | 0 refills | Status: AC | PRN

## 2016-12-06 MED ORDER — ALBUTEROL SULFATE (2.5 MG/3ML) 0.083% IN NEBU: 2.5000 mg | INHALATION_SOLUTION | RESPIRATORY_TRACT | 12 refills | Status: AC | PRN

## 2016-12-06 MED ORDER — POLYVINYL ALCOHOL 1.4 % OP SOLN: 1.0000 [drp] | Freq: Four times a day (QID) | OPHTHALMIC | 0 refills | Status: AC | PRN

## 2016-12-06 MED ORDER — LORAZEPAM 2 MG/ML IJ SOLN: 1.0000 mg | INTRAMUSCULAR | 0 refills | Status: AC | PRN

## 2016-12-06 MED ORDER — DM-GUAIFENESIN ER 30-600 MG PO TB12: 1.0000 | ORAL_TABLET | Freq: Two times a day (BID) | ORAL | 0 refills | Status: AC | PRN

## 2016-12-06 MED ORDER — ARFORMOTEROL TARTRATE 15 MCG/2ML IN NEBU: 15.0000 ug | INHALATION_SOLUTION | Freq: Two times a day (BID) | RESPIRATORY_TRACT | 0 refills | Status: AC

## 2016-12-06 MED ORDER — MOMETASONE FURO-FORMOTEROL FUM 200-5 MCG/ACT IN AERO: 2.0000 | INHALATION_SPRAY | Freq: Two times a day (BID) | RESPIRATORY_TRACT | 0 refills | Status: AC

## 2016-12-06 MED ORDER — TAMSULOSIN HCL 0.4 MG PO CAPS: 0.4000 mg | ORAL_CAPSULE | Freq: Every day | ORAL | 0 refills | Status: AC

## 2016-12-06 MED ORDER — SODIUM CHLORIDE 0.9 % IV SOLN: 8.0000 mg | Freq: Four times a day (QID) | INTRAVENOUS | 0 refills | Status: AC | PRN

## 2016-12-06 MED ORDER — GABAPENTIN ENACARBIL ER 300 MG PO TBCR: 1.0000 | EXTENDED_RELEASE_TABLET | Freq: Every day | ORAL | 0 refills | Status: AC

## 2016-12-06 MED ORDER — CLONAZEPAM 1 MG PO TABS: 2.0000 mg | ORAL_TABLET | Freq: Every evening | ORAL | 0 refills | Status: AC | PRN

## 2016-12-06 MED ORDER — ONDANSETRON HCL 8 MG PO TABS: ORAL_TABLET | ORAL | 0 refills | Status: AC

## 2016-12-06 MED ORDER — HALOPERIDOL LACTATE 5 MG/ML IJ SOLN
1.0000 mg | Freq: Three times a day (TID) | INTRAMUSCULAR | Status: DC
Start: 2016-12-06 — End: 2016-12-06

## 2016-12-06 MED ORDER — GLYCOPYRROLATE 0.2 MG/ML IJ SOLN: 0.6000 mg | Freq: Four times a day (QID) | INTRAMUSCULAR | 0 refills | Status: AC

## 2016-12-06 MED ORDER — SODIUM CHLORIDE 0.9 % IV SOLN
25.0000 mg | Freq: Four times a day (QID) | INTRAVENOUS | Status: DC | PRN
  Filled 2016-12-06: qty 1

## 2016-12-06 MED ORDER — ONDANSETRON HCL 8 MG PO TABS: 8.0000 mg | ORAL_TABLET | Freq: Four times a day (QID) | ORAL | 0 refills | Status: AC | PRN

## 2016-12-06 MED ORDER — MORPHINE SULFATE (PF) 2 MG/ML IV SOLN: 1.0000 mg | INTRAVENOUS | 0 refills | Status: AC | PRN

## 2016-12-06 MED ORDER — FUROSEMIDE 10 MG/ML IJ SOLN
60.0000 mg | Freq: Three times a day (TID) | INTRAMUSCULAR | Status: DC
  Administered 2016-12-06: 60 mg via INTRAVENOUS
  Filled 2016-12-06: qty 6

## 2016-12-06 MED ORDER — MORPHINE SULFATE 15 MG PO TABS: 15.0000 mg | ORAL_TABLET | ORAL | 0 refills | Status: AC | PRN

## 2016-12-06 MED ORDER — GLYCOPYRROLATE 0.2 MG/ML IJ SOLN: 0.6000 mg | Freq: Four times a day (QID) | INTRAMUSCULAR | Status: DC

## 2016-12-06 MED ORDER — SODIUM CHLORIDE 0.9 % IV SOLN: 25.0000 mg | Freq: Four times a day (QID) | INTRAVENOUS | 0 refills | Status: AC | PRN

## 2016-12-06 MED ORDER — GLYCOPYRROLATE 0.2 MG/ML IJ SOLN
0.6000 mg | Freq: Four times a day (QID) | INTRAMUSCULAR | Status: DC
  Administered 2016-12-06: 0.6 mg via INTRAVENOUS
  Filled 2016-12-06 (×5): qty 3

## 2016-12-06 MED ORDER — IPRATROPIUM-ALBUTEROL 0.5-2.5 (3) MG/3ML IN SOLN: 3.0000 mL | Freq: Four times a day (QID) | RESPIRATORY_TRACT | 0 refills | Status: AC

## 2016-12-06 MED ORDER — OLANZAPINE 5 MG PO TBDP: 5.0000 mg | ORAL_TABLET | Freq: Every day | ORAL | 0 refills | Status: AC

## 2016-12-06 MED ORDER — PROCHLORPERAZINE MALEATE 10 MG PO TABS: 10.0000 mg | ORAL_TABLET | Freq: Four times a day (QID) | ORAL | 0 refills | Status: AC | PRN

## 2016-12-06 NOTE — Discharge Summary (Signed)
Physician Discharge Summary  Ernest [name] Lle[place]as JJK:[id number] DOB: 1956-05-02 DOA: 12/04/2016  PCP: [name], MD  Admit date: 12/04/2016 Discharge date: 12/06/2016  Time spent: 35 minutes  Recommendations for Outpatient Follow-up:  Acute on Chronic respiratory failure with Hypoxia/Postobstructive PNA -Postobstructive pneumonia secondary to Metastatic NSCLC  -Patient requests no labs, diagnostic testing. Requests to be made comfortable. -Comfort Care:  OSA -CPAP per respiratory -Comfort Care  DM type II -Comfort Care  Refractory N/V -Thorazine  IV PRN -Comfort Care    Discharge Diagnoses:  Principal Problem:   Acute respiratory failure with hypoxia ([place]) Active Problems:   Non-small cell carcinoma of lung, right (HCC)   Chronic respiratory failure with hypercapnia (HCC)   Claudication (HCC)   Acute respiratory failure with hypoxia and hypercapnia (HCC)   OSA (obstructive sleep apnea)   Dyspnea   Acute on chronic respiratory failure with hypoxia (HCC)   Non-small cell lung cancer ([place])   Terminal care   Discharge Condition: Guarded  Diet recommendation: Clear liquid, advance as patient tolerates for comfort  Filed Weights   12/04/16 2303 12/05/16 0545  Weight: 73 kg (161 lb) 66.8 kg (147 lb 4.3 oz)    History of present illness:  61 y.o.WM PMHx Depression,Anxiety, Metastatic Non-Small Cell Lung Cancer, COPD, HTN, HLD, CAD native artery,, DM type II, OSA  Brought to the ER afterhad persistent vomiting with shortness of breath and twitching of upper extremity muscles. In the ER patient was found to be short of breath labs show leukocytosis. Chest x-ray shows possible postobstructive pneumonia. On exam patient also has wheezing. At this time patient and patient's wife are only requesting comfort measures and they're planningto be admitted to hospice. Patient will be admitted for comfort measures. During this hospitalization patient and wife has made clear  that they request inpatient hospice in [place] area. They have met with our [place], who was arranged for a bed at [facility].    Consultations: PA [place]     Discharge Exam: Vitals:   12/06/16 0602 12/06/16 0819 12/06/16 0820 12/06/16 1310  BP: 114/60   121/89  Pulse: (!) 101   (!) 108  Resp: (!) 21   18  Temp: 98 F (36.7 C)   98.6 F (37 C)  TempSrc: Oral   Oral  SpO2: 96% 98% 98% 97%  Weight:      Height:        General: A/O 4, positive acute on chronic respiratory distress Eyes: negative scleral hemorrhage, negative anisocoria, negative icterus ENT: Negative Runny nose, negative gingival bleeding, Neck:  Negative scars, masses, torticollis, lymphadenopathy, JVD Lungs: tachypnea diffuse rhonchi  without wheezes or crackles Cardiovascular: Tachycardic, Regularrhythm without murmur gallop or rub normal S1 and S2   Discharge Instructions   Allergies as of 12/06/2016      Reactions   Carboplatin Shortness Of Breath, Swelling, Rash   Swelling of lips, rash on face,eyes and head      Medication List    STOP taking these medications   ADVAIR DISKUS 500-50 MCG/DOSE Aepb Generic drug:  Fluticasone-Salmeterol Replaced by:  mometasone-formoterol 200-5 MCG/ACT Aero   ALPRAZolam 0.5 MG tablet Commonly known as:  XANAX   ARIPiprazole 5 MG tablet Commonly known as:  ABILIFY   Armodafinil 250 MG tablet   aspirin 81 MG EC tablet   atorvastatin 20 MG tablet Commonly known as:  LIPITOR   ciclopirox 0.77 % cream Commonly known as:  LOPROX  DALIRESP 500 MCG Tabs tablet Generic drug:  roflumilast   dexamethasone 4 MG tablet Commonly known as:  DECADRON   esomeprazole 40 MG capsule Commonly known as:  NEXIUM   feeding supplement (ENSURE ENLIVE) Liqd   fluconazole 100 MG tablet Commonly known as:  DIFLUCAN   flunisolide 25 MCG/ACT (0.025%) Soln Commonly known as:  NASALIDE   FLUoxetine  40 MG capsule Commonly known as:  PROZAC   glipiZIDE 5 MG tablet Commonly known as:  GLUCOTROL   KLOR-CON M20 20 MEQ tablet Generic drug:  potassium chloride SA   LORazepam 0.5 MG tablet Commonly known as:  ATIVAN Replaced by:  LORazepam 2 MG/ML injection   magic mouthwash Soln   Methylnaltrexone Bromide 150 MG Tabs Commonly known as:  RELISTOR   metoprolol succinate 25 MG 24 hr tablet Commonly known as:  TOPROL-XL   mirtazapine 45 MG tablet Commonly known as:  REMERON   morphine 30 MG 12 hr tablet Commonly known as:  MS CONTIN Replaced by:  morphine 2 MG/ML injection You also have another medication with the same name that you need to continue taking as instructed.   nitroGLYCERIN 0.4 MG SL tablet Commonly known as:  NITROSTAT   pantoprazole 40 MG tablet Commonly known as:  PROTONIX   predniSONE 5 MG tablet Commonly known as:  DELTASONE   theophylline 400 MG 24 hr tablet Commonly known as:  UNIPHYL   Tiotropium Bromide Monohydrate 1.25 MCG/ACT Aers Commonly known as:  SPIRIVA RESPIMAT     TAKE these medications   albuterol (2.5 MG/3ML) 0.083% nebulizer solution Commonly known as:  PROVENTIL Take 3 mLs (2.5 mg total) by nebulization every 2 (two) hours as needed for wheezing or shortness of breath. What changed:  See the new instructions.   arformoterol 15 MCG/2ML Nebu Commonly known as:  BROVANA Take 2 mLs (15 mcg total) by nebulization 2 (two) times daily.   chlorproMAZINE 25 mg in sodium chloride 0.9 % 25 mL Inject 25 mg into the vein every 6 (six) hours as needed.   clonazePAM 1 MG tablet Commonly known as:  KLONOPIN Take 2 tablets (2 mg total) by mouth at bedtime as needed for anxiety (or sleep). Reported on 01/16/2016   dextromethorphan-guaiFENesin 30-600 MG 12hr tablet Commonly known as:  MUCINEX DM Take 1 tablet by mouth 2 (two) times daily as needed for cough (congestion).   Gabapentin Enacarbil ER 300 MG Tbcr Commonly known as:   HORIZANT Take 1 capsule by mouth daily. Start taking on:  12/07/2016   glycopyrrolate 0.2 MG/ML injection Commonly known as:  ROBINUL Inject 2 mLs (0.4 mg total) into the vein every 4 (four) hours as needed (gurgling, secretions).   glycopyrrolate 0.2 MG/ML injection Commonly known as:  ROBINUL Inject 3 mLs (0.6 mg total) into the vein 4 (four) times daily.   ipratropium-albuterol 0.5-2.5 (3) MG/3ML Soln Commonly known as:  DUONEB Take 3 mLs by nebulization 4 (four) times daily. What changed:  when to take this  reasons to take this   LORazepam 2 MG/ML injection Commonly known as:  ATIVAN Inject 0.5 mLs (1 mg total) into the vein every 4 (four) hours as needed for anxiety (Twitching, Sleep, nausea). Replaces:  LORazepam 0.5 MG tablet   mometasone-formoterol 200-5 MCG/ACT Aero Commonly known as:  DULERA Inhale 2 puffs into the lungs 2 (two) times daily. Replaces:  ADVAIR DISKUS 500-50 MCG/DOSE Aepb   morphine 15 MG tablet Commonly known as:  MSIR Take 1 tablet (15 mg total) by  mouth every 4 (four) hours as needed for severe pain. What changed:  Another medication with the same name was added. Make sure you understand how and when to take each.  Another medication with the same name was removed. Continue taking this medication, and follow the directions you see here.   morphine 2 MG/ML injection Inject 0.5-2 mLs (1-4 mg total) into the vein every 15 (fifteen) minutes as needed (Dyspnea). What changed:  You were already taking a medication with the same name, and this prescription was added. Make sure you understand how and when to take each. Replaces:  morphine 30 MG 12 hr tablet   OLANZapine zydis 5 MG disintegrating tablet Commonly known as:  ZYPREXA Take 1 tablet (5 mg total) by mouth at bedtime.   ondansetron 8 mg in sodium chloride 0.9 % 50 mL Inject 8 mg into the vein every 6 (six) hours as needed.   ondansetron 8 MG tablet Commonly known as:  ZOFRAN TAKE 1  TABLET (8 MG TOTAL) BY MOUTH EVERY 8 (EIGHT) HOURS AS NEEDED FOR NAUSEA OR VOMITING. What changed:  Another medication with the same name was added. Make sure you understand how and when to take each.   ondansetron 8 MG tablet Commonly known as:  ZOFRAN Take 1 tablet (8 mg total) by mouth every 6 (six) hours as needed for nausea. What changed:  You were already taking a medication with the same name, and this prescription was added. Make sure you understand how and when to take each.   OXYGEN Inhale 2 L into the lungs daily.   polyvinyl alcohol 1.4 % ophthalmic solution Commonly known as:  LIQUIFILM TEARS Place 1 drop into both eyes 4 (four) times daily as needed for dry eyes.   prochlorperazine 10 MG tablet Commonly known as:  COMPAZINE Take 1 tablet (10 mg total) by mouth every 6 (six) hours as needed for nausea or vomiting.   tamsulosin 0.4 MG Caps capsule Commonly known as:  FLOMAX Take 1 capsule (0.4 mg total) by mouth daily.      Allergies  Allergen Reactions  . Carboplatin Shortness Of Breath, Swelling and Rash    Swelling of lips, rash on face,eyes and head      The results of significant diagnostics from this hospitalization (including imaging, microbiology, ancillary and laboratory) are listed below for reference.    Significant Diagnostic Studies: Dg Chest Port 1 View  Result Date: 12/05/2016 CLINICAL DATA:  Acute onset of worsening shortness of breath. Initial encounter. EXAM: PORTABLE CHEST 1 VIEW COMPARISON:  Chest radiograph performed 11/14/2016, and CT of the chest performed 11/11/2016 FINDINGS: The patient's large right lung and mediastinal masses are again noted, with redistribution of surrounding right basilar airspace opacification, concerning for persistent superimposed postobstructive pneumonia. There is no evidence of pleural effusion or pneumothorax. Chronic left apical scarring is noted. The cardiomediastinal silhouette is enlarged, reflecting the  mediastinal mass. No acute osseous abnormalities are seen. Cervical spinal fusion hardware is noted. IMPRESSION: Redistribution of right basilar airspace opacification, concerning for persistent superimposed postobstructive pneumonia, with underlying large right lung and mediastinal masses again seen. Electronically Signed   By: Garald Balding M.D.   On: 12/05/2016 00:09   Dg Chest Port 1 View  Result Date: 11/14/2016 CLINICAL DATA:  Intubation. EXAM: PORTABLE CHEST 1 VIEW COMPARISON:  11/13/2016 .  09/12/2016. FINDINGS: Endotracheal tube and NG tube in stable position. Persistent severe mediastinal adenopathy and large right lung mass without interim change again noted. No prominent  pleural effusion. No pneumothorax. Left apical pleural thickening unchanged. Prior cervicothoracic spine fusion . IMPRESSION: 1. Lines and tubes in stable position. 2. Persistent severe mediastinal adenopathy and right lung mass without interim change. Electronically Signed   By: Marcello Moores  Register   On: 11/14/2016 07:34   Dg Chest Port 1 View  Result Date: 11/13/2016 CLINICAL DATA:  Endotracheal tube. EXAM: PORTABLE CHEST 1 VIEW COMPARISON:  11/12/2016 FINDINGS: Endotracheal tube projects between the clavicular heads and carina, unchanged. Enteric tube courses into the left upper abdomen with tip not imaged and side hole just beyond the GE junction. Right peritracheal and right lower lobe masses are similar to the prior study. Patchy airspace and interstitial opacities in the right lung predominantly involve the upper lobe and also have not significantly changed. Chronic apical left upper lobe consolidation and pleural thickening/loculated fluid are unchanged. No pneumothorax is identified. IMPRESSION: 1. Unchanged right lung opacities concerning for pneumonia. 2. Unchanged right paratracheal and right lower lobe masses. Electronically Signed   By: Logan Bores M.D.   On: 11/13/2016 08:52   Dg Chest Port 1 View  Result  Date: 11/12/2016 CLINICAL DATA:  Endotracheal tube placement. EXAM: PORTABLE CHEST 1 VIEW COMPARISON:  Radiographs of November 11, 2016. FINDINGS: Endotracheal and nasogastric tubes are unchanged in position. Stable apical pleural thickening and scarring is noted. No pneumothorax is noted. Stable right peritracheal mass and right lower lobe mass is noted. Mild interstitial densities are noted throughout right lung concerning for edema or possibly inflammation. Bony thorax is unremarkable. IMPRESSION: Stable support apparatus. Stable right peritracheal mass and right lower lobe mass consistent with malignancy. Mild interstitial densities are noted throughout right lung concerning for edema or possibly inflammation. Electronically Signed   By: Marijo Conception, M.D.   On: 11/12/2016 08:12   Dg Chest Portable 1 View  Result Date: 11/11/2016 CLINICAL DATA:  Emergent intubation. Shortness of breath. History of lung cancer. EXAM: PORTABLE CHEST 1 VIEW COMPARISON:  Chest radiograph October 22, 2016 FINDINGS: Endotracheal tube tip projects 3 cm above the carina. Negative nasogastric tube tip and side-port project in proximal stomach. Multiple masses project in RIGHT lung, and appear larger than prior examination. LEFT apical pleural thickening and scarring. Predominately in RIGHT interstitial prominence. Increased lung volumes. No pleural effusions. No pneumothorax. Mild cardiomegaly. ACDF. Osseous structure nonsuspicious. IMPRESSION: Endotracheal tube tip projects 3 cm above the carina. Nasogastric tube tip projects in proximal stomach. Multiple RIGHT lung/lymphadenopathy masses appear larger though, this could be technical. Interstitial prominence RIGHT lung concerning for infection/postobstructive pneumonia versus acute edema. Electronically Signed   By: Elon Alas M.D.   On: 11/11/2016 04:14   Ct Angio Chest/abd/pel For Dissection W And/or Wo Contrast  Result Date: 11/11/2016 CLINICAL DATA:   61 year old male with elevated D-dimer and respiratory distress and hypoxia. History of lung cancer. EXAM: CT ANGIOGRAPHY CHEST, ABDOMEN AND PELVIS TECHNIQUE: Multidetector CT imaging through the chest, abdomen and pelvis was performed using the standard protocol during bolus administration of intravenous contrast. Multiplanar reconstructed images and MIPs were obtained and reviewed to evaluate the vascular anatomy. CONTRAST:  100 cc Isovue 370 COMPARISON:  Chest radiograph dated 11/11/2016 and chest CT dated 07/24/2016 and 06/07/2016 FINDINGS: CTA CHEST FINDINGS Cardiovascular: Top-normal cardiac size with mild dilatation of the left ventricle. No pericardial effusion. There is coronary vascular calcification primarily involving the LAD and left circumflex artery. There is mild atherosclerotic plaque in the thoracic aorta. There is no aneurysmal dilatation or evidence of dissection. The origin of  the right vertebral artery is not well visualized and is artery may be hypoplastic. The remainder of the visualized origins of the great vessels of the aortic arch appear patent. There is mild dilatation of the main pulmonary trunk suggestive of underlying pulmonary hypertension. No CT evidence of pulmonary embolism. Mediastinum/Nodes: Significant interval progression of right hilar and mediastinal adenopathy. Subcarinal adenopathy measures 6.5 x 9.5 cm (previously approximately 3.6 x 3.1 cm). Right upper mediastinal/paratracheal adenopathy or confluence of lymph nodes measure approximately 7.7 x 8.2 cm (previously 4.1 x 3.8 cm). There is loss of right paratracheal fat plane. An enteric tube is noted in the esophagus. There is infiltration of the paraesophageal fat. There is encasement and mild narrowing of the right upper lobe bronchus. There is also encasement of the bronchus intermedius. The central airways however remain patent. Lungs/Pleura: There is a 10.2 x 7.3 cm right lower lobe mass (previously 6.3 x 5.3 cm).  This mass appears in continuity with the subcarinal mass. There is large area of airspace consolidation in the right upper lobe stop, most likely representing superimposed pneumonia. A 12 mm nodular density in the right upper lobe anteriorly likely pneumonia and less likely metastatic disease. There has been interval increase in the size of the left upper lobe consolidation/fibrosis and cavitation. There is a small right pleural effusion. No pneumothorax. There is mild right-sided tracheal shift likely related to right lung volume loss or traction fibrosis. An endotracheal diffuse is noted with tip approximately 3.5 cm above the carina. Musculoskeletal: There is no axillary adenopathy. The chest wall soft tissues appear unremarkable. Lower cervical anterior fusion hardware. No acute fracture. Review of the MIP images confirms the above findings. CTA ABDOMEN AND PELVIS FINDINGS VASCULAR Aorta: Moderate aortoiliac atherosclerotic disease. No aneurysmal dilatation or evidence of dissection Celiac: Patent without evidence of aneurysm, dissection, vasculitis or significant stenosis. SMA: Patent without evidence of aneurysm, dissection, vasculitis or significant stenosis. Renals: Both renal arteries are patent without evidence of aneurysm, dissection, vasculitis, fibromuscular dysplasia or significant stenosis. IMA: Patent without evidence of aneurysm, dissection, vasculitis or significant stenosis. Inflow: Advanced atherosclerotic calcification of the iliac vasculature. Left external iliac artery stent appears patent. Veins: No obvious venous abnormality within the limitations of this arterial phase study. Review of the MIP images confirms the above findings. NON-VASCULAR No intra-abdominal free air or free fluid. Hepatobiliary: Slight irregularity of the hepatic contour. No enhancing lesion. No intrahepatic biliary ductal dilatation. The gallbladder is unremarkable. Pancreas: Unremarkable. No pancreatic ductal  dilatation or surrounding inflammatory changes. Spleen: A 5.1 x 3.5 cm inferior splenic hypodense lesion, incompletely characterized but similar to prior CT. Adrenals/Urinary Tract: The adrenal glands appear unremarkable. Probable small right renal cyst as seen on the prior CT. There is no hydronephrosis on either side. The visualized ureters and urinary bladder appear unremarkable. Stomach/Bowel: An enteric tube is noted with tip in the proximal stomach. Large amount of stool noted throughout the colon. There is sigmoid diverticulosis without active inflammatory changes. Focal thickening of the sigmoid colon likely related to underdistention and muscular hypertrophy. The degree of stricture is not excluded. There is no evidence of bowel obstruction or active inflammation. Normal appendix. Lymphatic: Top-normal retroperitoneal and para-aortic lymph nodes. Reproductive: The prostate and seminal vesicles are grossly unremarkable. Other: None Musculoskeletal: L4-L5 posterior fusion hardware. No acute fracture. No suspicious bone lesions. Chronic irregularity of the left iliac crest. No acute fracture. Review of the MIP images confirms the above findings. IMPRESSION: No CT evidence of pulmonary embolism or aortic  dissection. Progression of disease with interval increase in the size of the right lower lobe mass with significant interval decrease in the size of the right hilar and mediastinal adenopathy. Large upper lobe airspace consolidative changes most compatible with superimposed pneumonia. Small right pleural effusion. Left apical consolidative change, fibrosis and small cavitation similar to the prior CT. Stable appearing inferior splenic hypodense lesion. Top-normal retroperitoneal and para-aortic lymph nodes. Constipation. No evidence of bowel obstruction or active inflammation. Normal appendix. Sigmoid diverticulosis without active inflammatory changes. Short segment thickened appearance sigmoid colon likely  related to muscular hypertrophy with possible mild stricture. Is Electronically Signed   By: Anner Crete M.D.   On: 11/11/2016 07:12    Microbiology: No results found for this or any previous visit (from the past 240 hour(s)).   Labs: Basic Metabolic Panel:  Recent Labs Lab 12/05/16 0001  NA 132*  K 4.4  CL 95*  CO2 24  GLUCOSE 104*  BUN 19  CREATININE 0.91  CALCIUM 9.2   Liver Function Tests:  Recent Labs Lab 12/05/16 0001  AST 28  ALT 24  ALKPHOS 81  BILITOT 1.5*  PROT 6.5  ALBUMIN 3.4*   No results for input(s): LIPASE, AMYLASE in the last 168 hours. No results for input(s): AMMONIA in the last 168 hours. CBC:  Recent Labs Lab 12/05/16 0001  WBC 20.8*  NEUTROABS 17.2*  HGB 13.4  HCT 41.4  MCV 88.5  PLT 199   Cardiac Enzymes: No results for input(s): CKTOTAL, CKMB, CKMBINDEX, TROPONINI in the last 168 hours. BNP: BNP (last 3 results)  Recent Labs  01/18/16 0236 11/11/16 0316  BNP 41.0 61.1    ProBNP (last 3 results) No results for input(s): PROBNP in the last 8760 hours.  CBG: No results for input(s): GLUCAP in the last 168 hours.     Signed:  Dia Crawford, MD [facility] [phone] pager

## 2016-12-06 NOTE — Progress Notes (Signed)
CSW spoke with pt and pts spouse, Rodena Piety, regarding discharge plans. Patient and family prefer [facility] and gave permission for CSW to reach out to their liaison. CSW contacted Erline Levine, Dentist for [facility], who stated [facility] may not have bed availability today. Erline Levine will follow up with CSW. CSW will update MD.   Kingsley Spittle, [facility] Clinical Social Worker [phone]

## 2016-12-06 NOTE — Consult Note (Signed)
   [facility] CM Inpatient Consult   12/06/2016  Ernest [name] 10-08-1956 [id number]    [name] has been followed by [facility] up until hospital admission and [facility] in the past. Chart reviewed. Noted [name] is full comfort care. Discussed with inpatient RNCM.   Marthenia Rolling, MSN-Ed, RN,BSN [facility] Liaison [phone]

## 2016-12-06 NOTE — Progress Notes (Signed)
Daily Progress Note   Patient Name: Ernest [name]       Date: 12/06/2016 DOB: 1956/09/04  Age: 61 y.o. MRN#: [id number] Attending Physician: Allie Bossier, MD Primary Care Physician: [name], MD Admit Date: 12/04/2016  Reason for Consultation/Follow-up: Hospice Evaluation, Non pain symptom management and Terminal Care  Subjective: I feel like I'm drowning. Wife states he would feel better if he could just bring up this fluid.   He wants to be propped up.  Assessment: Pulmonary edema at end of life secondary to metastatic lung cancer and post obstructive pneumonia   Patient Profile/HPI: 62 y.o. 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  He was found to have post obstructive pneumonia.   He had a recent admission for the same in late December 2017. The patient and his wife Rodena Piety have requested comfort measures only.   Length of Stay: 0  Current Medications: Scheduled Meds:  . arformoterol  15 mcg Nebulization BID  . furosemide  60 mg Intravenous TID  . Gabapentin Enacarbil ER  1 capsule Oral Daily  . glycopyrrolate  0.6 mg Intravenous QID  . haloperidol lactate  1 mg Intravenous Q8H  . ipratropium-albuterol  3 mL Nebulization QID  . mometasone-formoterol  2 puff Inhalation BID  . OLANZapine zydis  5 mg Oral QHS    Continuous Infusions:   PRN Meds: acetaminophen **OR** acetaminophen, albuterol, antiseptic oral rinse, glycopyrrolate, haloperidol **OR** haloperidol **OR** haloperidol lactate, LORazepam, morphine, morphine injection, ondansetron **OR** ondansetron (ZOFRAN) IV, polyvinyl alcohol  Physical Exam  Constitutional: He is oriented to person, place,  and time.  Pleasant but ill appearing male.  Wife at bedside.  HENT:  Head: Normocephalic and atraumatic.  Eyes: No scleral icterus.  Cardiovascular: Normal rate.   Pulmonary/Chest: He is in respiratory distress.  Coughing up white foam.  Sitting up leaning forward to breathe.  Some distress.  Neurological: He is alert and oriented to person, place, and time.  Psychiatric: He has a normal mood and affect. His behavior is normal. Judgment and thought content normal.           Vital Signs: BP 114/60 (BP Location: Left Arm)   Pulse (!) 101  Temp 98 F (36.7 C) (Oral)   Resp (!) 21   Ht '5\' 6"'$  (1.676 m)   Wt 66.8 kg (147 lb 4.3 oz)   SpO2 96%   BMI 23.77 kg/m  SpO2: SpO2: 96 % O2 Device: O2 Device: Nasal Cannula O2 Flow Rate: O2 Flow Rate (L/min): 3 L/min  Intake/output summary:  Intake/Output Summary (Last 24 hours) at 12/06/16 0814 Last data filed at 12/05/16 2300  Gross per 24 hour  Intake              360 ml  Output              600 ml  Net             -240 ml   LBM: Last BM Date: 12/04/16 Baseline Weight: Weight: 73 kg (161 lb) Most recent weight: Weight: 66.8 kg (147 lb 4.3 oz)       Palliative Assessment/Data:    Flowsheet Rows   Flowsheet Row Most Recent Value  Intake Tab  Referral Department  Hospitalist  Unit at Time of Referral  Med/Surg Unit  Palliative Care Primary Diagnosis  Cancer  Date Notified  12/05/16  Palliative Care Type  New Palliative care  Reason for referral  Counsel Regarding Hospice, Psychosocial or Spiritual support, End of Life Care Assistance  Date of Admission  12/04/16  Date first seen by Palliative Care  12/05/16  # of days Palliative referral response time  0 Day(s)  # of days IP prior to Palliative referral  1  Clinical Assessment  Palliative Performance Scale Score  20%  Psychosocial & Spiritual Assessment  Palliative Care Outcomes  Patient/Family meeting held?  Yes  Who was at the meeting?  patient and wife  Palliative  Care Outcomes  Improved pain interventions, Improved non-pain symptom therapy, Clarified goals of care, Transitioned to hospice      Patient Active Problem List   Diagnosis Date Noted  . Dyspnea 12/05/2016  . Acute respiratory failure with hypoxia ([name]) 12/05/2016  . Acute on chronic respiratory failure with hypoxia ([place])   . Non-small cell lung cancer ([place])   . Pressure injury of skin 11/18/2016  . Postobstructive pneumonia   . Non-small cell cancer of right lung ([place])   . OSA (obstructive sleep apnea)   . CAD in native artery   . Type 2 diabetes mellitus with complication ([place])   . History of ETT   . Goals of care, counseling/discussion   . Acute respiratory failure with hypoxia and hypercapnia ([place]) 11/11/2016  . Acute bronchitis 11/05/2016  . Tinea corporis 10/31/2016  . Hyponatremia 10/22/2016  . Anxiety 10/22/2016  . Neoplasm related pain 08/27/2016  . Acute seasonal allergic rhinitis due to pollen 08/27/2016  . Insomnia due to anxiety and fear 08/01/2016  . Therapeutic opioid-induced constipation (OIC) 07/18/2016  . Shortness of breath 06/05/2016  . Non-intractable vomiting with nausea 03/26/2016  . Encounter for hospice care discussion 02/20/2016  . 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 COPD exacerbation ([place]) 11/24/2015  . Rhinitis, nonallergic, chronic 09/22/2015  . 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 Claudication ([place]) 08/10/2015  . Coronary artery disease involving native coronary artery of native heart without angina pectoris 05/17/2015  . Diabetes mellitus without complication ([place]) 48/18/5631  . Palliative care encounter   . Chronic respiratory failure with hypercapnia ([place])   . Chronic pain syndrome   . Chronic respiratory failure with hypoxia ([place]) 12/15/2014  . Non-small cell carcinoma of lung, right ([place]) 10/07/2013  .  Obstructive sleep apnea 09/24/2010  . Peripheral vascular disease ([place]) 05/15/2010  . Hyperlipidemia with target LDL less than 70  05/16/2009  . GOUT 09/08/2007  . Depression with anxiety 09/08/2007  . Essential hypertension 09/08/2007  . COPD mixed type ([place]) 09/08/2007  . GERD 09/08/2007    Palliative Care Plan    Recommendations/Plan:  Add scheduled robinul for secretions  Add scheduled IV lasix to attempt to dry him out  Increase scheduled haldol  Will dialogue with bedside RN with regard to close attention - comfort measures - giving PRNs generously.  Goals of Care and Additional Recommendations:  Limitations on Scope of Treatment: Full Comfort Care  Code Status:  DNR  Prognosis:   Hours - Days   Discharge Planning:  Anticipated Hospital Death vs hospice house.  Care plan was discussed with Patient, wife, bedside RN  Thank you for allowing the Palliative Medicine Team to assist in the care of this patient.  Total time spent:  25 min.     Greater than 50%  of this time was spent counseling and coordinating care related to the above assessment and plan.  Imogene Burn, PA-C Palliative Medicine  Please contact Palliative MedicineTeam phone at [phone] for questions and concerns between 7 am - 7 pm.   Please see AMION for individual provider pager numbers.

## 2016-12-06 NOTE — Progress Notes (Addendum)
Patient is set to discharge to [facility] today. DC summary faxed to [facility]. Patient & spouse, Ernest [name], aware. Discharge packet given to RN. [facility] called for transport.   Kingsley Spittle, LCSWA Clinical Social Worker [phone]

## 2016-12-06 NOTE — Progress Notes (Signed)
[facility] Liaison: RN Received request from [place], [place] for patient/family interest in [facility] with request for transfer today.  Chart reviewed.  Judeen Hammans, RN to meet with patient/family at 2:00pm to do registration paperwork.  Paperwork to be completed by 230pm today.  CSW aware.  Please fax discharge summary to [phone].  RN please call report to [phone].  Please arrange transport for patient to arrive asap after paperwork is completed and received by [facility].   Thank you,   Edyth Gunnels, RN, BSN [facility] Liaison [phone]  All hospital liaisons are now on [place].

## 2016-12-06 NOTE — Progress Notes (Signed)
CSW received call from [place] with [place] who stated [facility] has availability today 1/12. CSW will update RN, MD, and family.   Kingsley Spittle, LCSWA Clinical Social Worker [phone]

## 2016-12-06 NOTE — Progress Notes (Signed)
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 The patient had recurrence in February 2017. PDL1 expression 0%.  PRIOR THERAPY: 1) Concurrent chemoradiation with weekly carboplatin for AUC of 2 and paclitaxel 45 mg/M2, status post 7 cycles, last dose was given 12/20/2013. First dose on 11/01/2013. 2) Consolidation chemotherapy with carboplatin for AUC of 5 and paclitaxel 175 mg/M2 every 3 weeks with Neulasta support. First cycle on 02/08/2014. Status post 3 cycles and carboplatin was discontinued secondary to allergic reaction. 3) Abraxane 100 MG/M2 on days 1 and 8 every 3 weeks. Status post 3 cycles. Last dose was given 04/16/2016 discontinued secondary to disease progression. 4) Nivolumab 240 MG IV every 2 weeks. First dose 05/27/2016. Status post one cycle. Discontinued secondary to disease progression. 5) systemic chemotherapy with docetaxel 75 MG/M2 110 MG/KG every 3 weeks status post 3 cycles. First cycle was given on 09/24/2016. This was discontinued today secondary to disease progression.  CURRENT THERAPY: None.  Subjective: The patient is seen and examined today. His wife was at the bedside. This is a very pleasant 61 years old white male with metastatic non-small cell lung cancer initially diagnosed as a stage IIIa in November 2013. He underwent several treatment regimens and recently including systemic chemotherapy as well as immunotherapy but continues to have disease progression. Unfortunately the patient continues to have decline in his condition and he is not a great candidate for any further treatment at this point. He was admitted to the hospital yesterday with significant shortness of breath and COPD exacerbation in addition to his disease progression. He denied having any  fever or chills. He has no nausea or vomiting. He has a lot of wheezes.  Objective: Vital signs in last 24 hours: Temp:  [98 F (36.7 C)] 98 F (36.7 C) (01/12 0602) Pulse Rate:  [65-108] 101 (01/12 0602) Resp:  [20-21] 21 (01/12 0602) BP: (114-141)/(60-95) 114/60 (01/12 0602) SpO2:  [95 %-100 %] 98 % (01/12 0820)  Intake/Output from previous day: 01/11 0701 - 01/12 0700 In: 360 [P.O.:360] Out: 600 [Urine:600] Intake/Output this shift: Total I/O In: 240 [P.O.:240] Out: -   General appearance: alert, cooperative, fatigued and mild distress Resp: diminished breath sounds RLL and RML, dullness to percussion RLL and RML and wheezes bilaterally Cardio: regular rate and rhythm, S1, S2 normal, no murmur, click, rub or gallop GI: soft, non-tender; bowel sounds normal; no masses,  no organomegaly Extremities: extremities normal, atraumatic, no cyanosis or edema  Lab Results:   Recent Labs  12/05/16 0001  WBC 20.8*  HGB 13.4  HCT 41.4  PLT 199   BMET  Recent Labs  12/05/16 0001  NA 132*  K 4.4  CL 95*  CO2 24  GLUCOSE 104*  BUN 19  CREATININE 0.91  CALCIUM 9.2    Studies/Results: Dg Chest Port 1 View  Result Date: 12/05/2016 CLINICAL DATA:  Acute onset of worsening shortness of breath. Initial encounter. EXAM: PORTABLE CHEST 1 VIEW COMPARISON:  Chest radiograph performed 11/14/2016, and CT of the chest performed 11/11/2016 FINDINGS: The patient's large right lung and mediastinal masses are again noted, with redistribution of surrounding right basilar airspace opacification, concerning for persistent superimposed postobstructive pneumonia. There is no evidence of pleural effusion or pneumothorax. Chronic left apical scarring is noted. The cardiomediastinal silhouette is enlarged, reflecting the mediastinal mass. No acute osseous abnormalities are seen.  Cervical spinal fusion hardware is noted. IMPRESSION: Redistribution of right basilar airspace opacification, concerning  for persistent superimposed postobstructive pneumonia, with underlying large right lung and mediastinal masses again seen. Electronically Signed   By: Garald Balding M.D.   On: 12/05/2016 00:09    Medications: I have reviewed the patient's current medications.  CODE STATUS: No CODE BLUE.  Assessment/Plan: This is a very pleasant 61 years old white male with multiple medical problems: 1) metastatic non-small cell lung cancer, squamous cell carcinoma initially diagnosed as a stage IIIa in November 2013. The patient is status post several courses of systemic chemotherapy as well as immunotherapy but continues to have disease progression. I had a lengthy discussion with the patient and his wife about his current condition and goals of care. I strongly recommended for the patient to consider palliative care and hospice at this point. I do think he will be a good candidate for any further systemic treatment. He also received a lot of radiotherapy to the chest and I don't think he would be a good candidate for radiation either. The patient his wife are in agreement with the current plan and we will consult the palliative care team. 2) shortness of breath secondary to disease progression as well as COPD exacerbation. Continue current supportive care with nebulizer. He may benefit from high dose steroids during his stay in the hospital to be tapered slowly after discharge. 3) CODE STATUS: No CODE BLUE Thank you so much for taking good care of Mr. Ibach, I will continue to follow up the patient with you and assist in his management on as-needed basis.   LOS: 0 days    Yuvonne Lanahan K. 12/06/2016

## 2016-12-08 ENCOUNTER — Other Ambulatory Visit: Payer: Self-pay | Admitting: Internal Medicine

## 2016-12-09 NOTE — Progress Notes (Signed)
Visit with patient to provide space for conversation and processing given current medical situation.  Patient asked Chaplain to pray with him.  Chaplain and patient had prayer.  Patient stated that his wife was at the hospital.  Chaplain located wife in nearby conference room meeting with Optometrist from [facility].  Chaplain waited for wife.  When representative from [facility] came out of the room, she thanked me for being there because of the emotional state of Mrs. Venters.  When the Chaplain arrived in the room, the wife stated that this situation and her husband's medical condition just became real.  She continued to cry as she shared what a good man he was.    Provided emotional and spiritual support for both patient and spouse.  Patient's pastor was coming over to [facility] once he got settled there.  Ofilia Neas Resident

## 2016-12-10 ENCOUNTER — Other Ambulatory Visit: Payer: Commercial Managed Care - HMO

## 2016-12-10 ENCOUNTER — Ambulatory Visit: Payer: Commercial Managed Care - HMO

## 2016-12-13 ENCOUNTER — Telehealth: Payer: Self-pay | Admitting: Medical Oncology

## 2016-12-13 NOTE — Telephone Encounter (Signed)
I returned wife"s call -pt died 01/04/17 at [facility].

## 2016-12-17 ENCOUNTER — Other Ambulatory Visit: Payer: Commercial Managed Care - HMO

## 2016-12-17 ENCOUNTER — Ambulatory Visit: Payer: Commercial Managed Care - HMO

## 2016-12-17 ENCOUNTER — Ambulatory Visit: Payer: Commercial Managed Care - HMO | Admitting: Internal Medicine

## 2016-12-18 DIAGNOSIS — J449 Chronic obstructive pulmonary disease, unspecified: Secondary | ICD-10-CM | POA: Diagnosis not present

## 2016-12-19 ENCOUNTER — Ambulatory Visit: Payer: Commercial Managed Care - HMO | Admitting: Internal Medicine

## 2016-12-24 ENCOUNTER — Ambulatory Visit: Payer: Commercial Managed Care - HMO

## 2016-12-24 ENCOUNTER — Other Ambulatory Visit: Payer: Commercial Managed Care - HMO

## 2016-12-24 ENCOUNTER — Ambulatory Visit: Payer: Commercial Managed Care - HMO | Admitting: Internal Medicine

## 2016-12-24 DIAGNOSIS — C342 Malignant neoplasm of middle lobe, bronchus or lung: Secondary | ICD-10-CM | POA: Diagnosis not present

## 2016-12-24 DIAGNOSIS — J449 Chronic obstructive pulmonary disease, unspecified: Secondary | ICD-10-CM | POA: Diagnosis not present

## 2016-12-24 DIAGNOSIS — J9601 Acute respiratory failure with hypoxia: Secondary | ICD-10-CM | POA: Diagnosis not present

## 2016-12-26 DEATH — deceased

## 2016-12-31 ENCOUNTER — Other Ambulatory Visit: Payer: Commercial Managed Care - HMO

## 2016-12-31 ENCOUNTER — Ambulatory Visit: Payer: Commercial Managed Care - HMO

## 2017-01-14 ENCOUNTER — Other Ambulatory Visit: Payer: Commercial Managed Care - HMO

## 2017-01-14 ENCOUNTER — Ambulatory Visit: Payer: Commercial Managed Care - HMO

## 2017-01-15 ENCOUNTER — Ambulatory Visit: Payer: Commercial Managed Care - HMO | Admitting: Neurology

## 2017-01-21 ENCOUNTER — Ambulatory Visit: Payer: Commercial Managed Care - HMO

## 2017-01-21 ENCOUNTER — Other Ambulatory Visit: Payer: Commercial Managed Care - HMO

## 2017-01-21 ENCOUNTER — Telehealth: Payer: Self-pay | Admitting: Medical Oncology

## 2017-01-21 NOTE — Telephone Encounter (Signed)
-----   Message from Curt Bears, MD sent at 01/20/2017  5:00 PM EST ----- Regarding: RE: ever in remission? No. He had stable disease at some point. ----- Message ----- From: Ardeen Garland, RN Sent: 01/20/2017  10:24 AM To: Curt Bears, MD Subject: ever in remission?                             His insurance for critical illness benefit wants to know if he was ever in remission.

## 2017-01-21 NOTE — Telephone Encounter (Signed)
I called her and per her request if pt ever in remission , I  told her pt never in remission but did have stable disease at one point.

## 2017-02-04 ENCOUNTER — Ambulatory Visit: Payer: Commercial Managed Care - HMO

## 2017-02-04 ENCOUNTER — Other Ambulatory Visit: Payer: Commercial Managed Care - HMO

## 2017-02-11 ENCOUNTER — Other Ambulatory Visit: Payer: Commercial Managed Care - HMO

## 2017-02-11 ENCOUNTER — Ambulatory Visit: Payer: Commercial Managed Care - HMO

## 2017-03-10 ENCOUNTER — Other Ambulatory Visit: Payer: Self-pay | Admitting: Nurse Practitioner

## 2017-03-20 ENCOUNTER — Telehealth: Payer: Self-pay | Admitting: Medical Oncology

## 2017-03-20 NOTE — Telephone Encounter (Signed)
Contacted rep and told her I can send notes detailing pt cancer care without sending all records form 2015-2017. Form placed on Ernest [name]'s desk for signature.

## 2017-03-24 ENCOUNTER — Ambulatory Visit: Payer: Commercial Managed Care - HMO | Admitting: Internal Medicine

## 2017-05-07 ENCOUNTER — Telehealth: Payer: Self-pay | Admitting: Internal Medicine

## 2017-05-07 NOTE — Telephone Encounter (Signed)
His wife called and said we should of got a form from insurance with some questions that need to be answered about the patient. Have you seen this form?

## 2017-05-07 NOTE — Telephone Encounter (Signed)
PCP has form. There were some questions that I was not able to answer.

## 2017-05-12 NOTE — Telephone Encounter (Signed)
Form has been complete and faxed back 6/18, Also they may need more information from the cancer doctor. Form has been sent to medical records.

## 2018-01-21 IMAGING — CT CT CHEST W/ CM
2 of 4 series · 13 of 36 positions shown, 16 images · IV contrast (ISOVUE)
Comparison: PET scan of January 23, 2016.

CLINICAL DATA: Cough. Current history of non-small-cell lung
cancer.

EXAM:
CT CHEST WITH CONTRAST
TECHNIQUE: Multidetector CT imaging of the chest was performed during
intravenous contrast administration.
CONTRAST:  75mL [id number] IOPAMIDOL ([id number]) INJECTION 61%

[Series 2: chest with st · axial · 0.86mm/px · z∈[-271,-4]mm · 10 of 109 slices shown, 13 images]
[im 10/109  mediastinal]
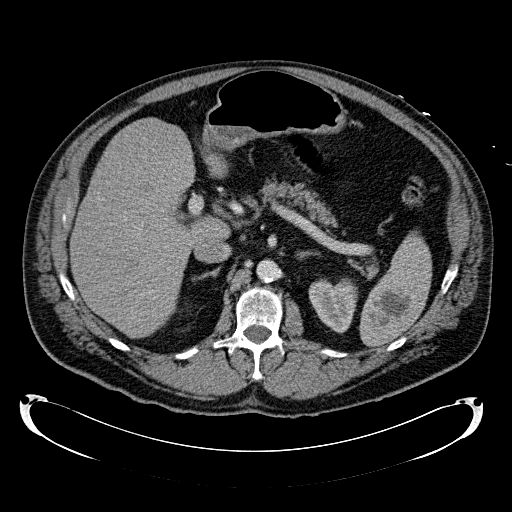
[im 10/109  lung]
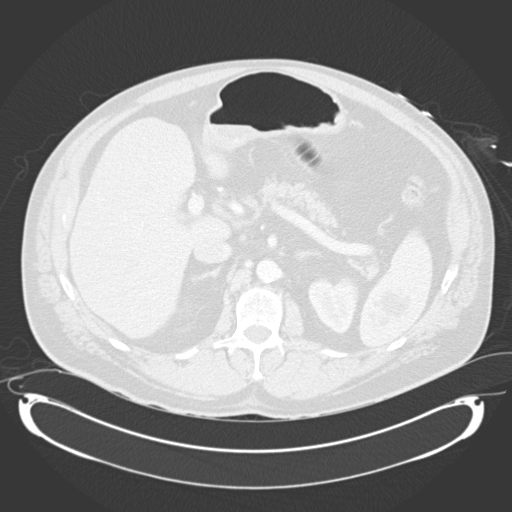
[im 20/109  lung]
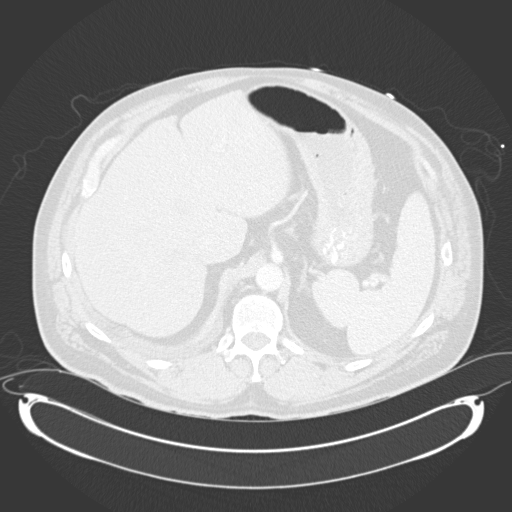
[im 30/109  lung]
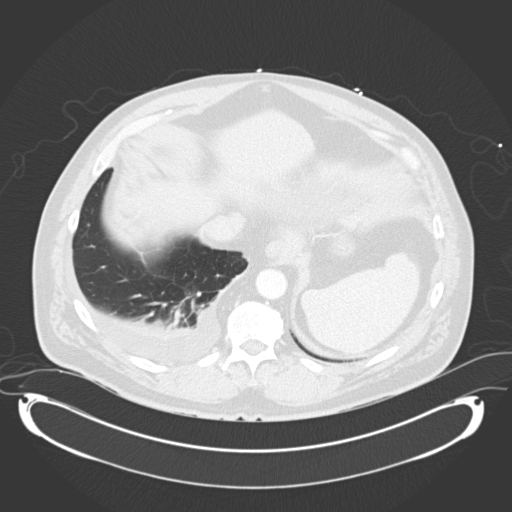
[im 40/109  lung]
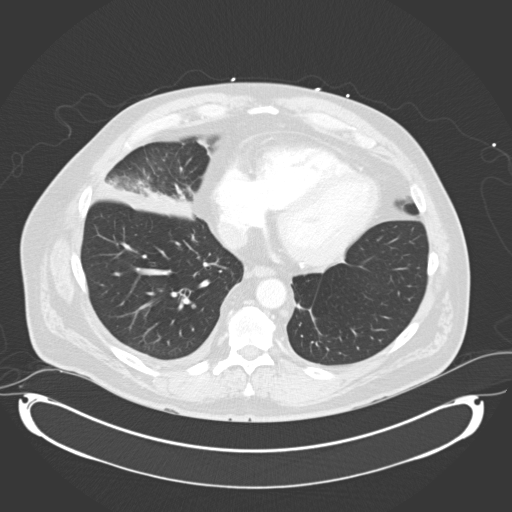
[im 50/109  mediastinal]
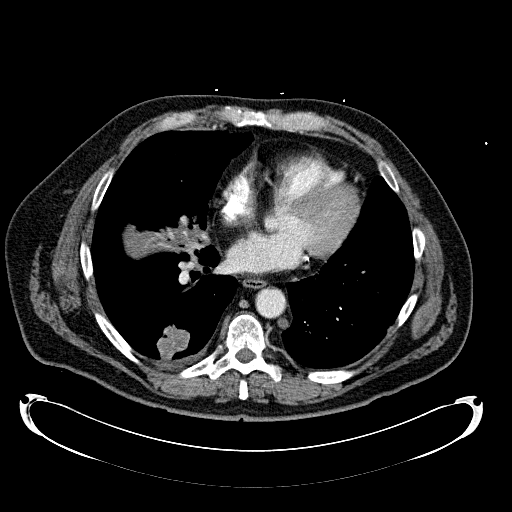
[im 50/109  lung]
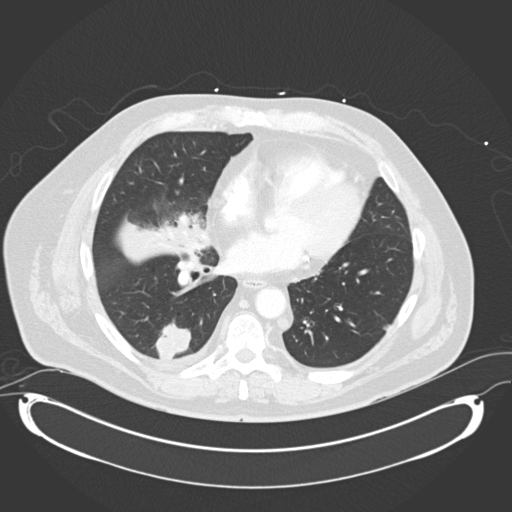
[im 59/109  lung]
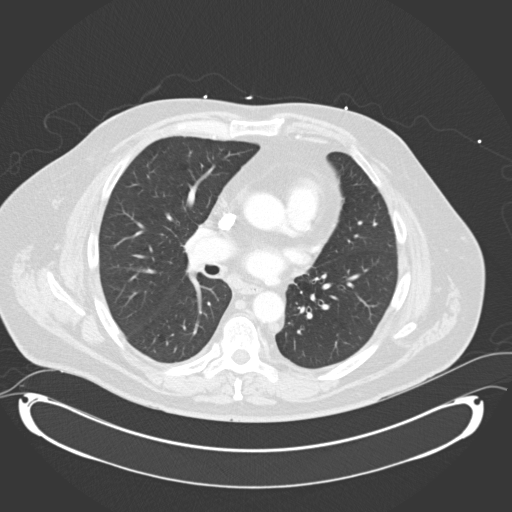
[im 69/109  lung]
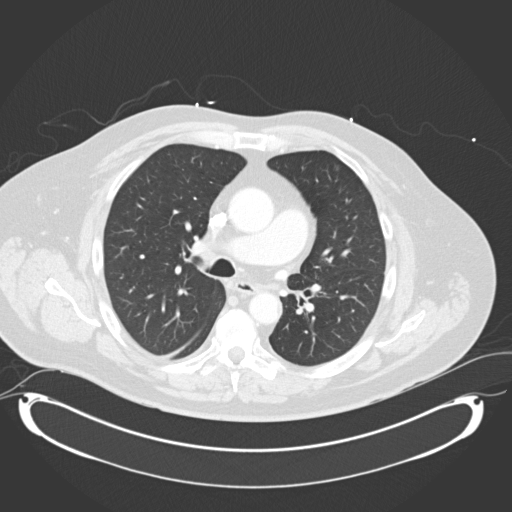
[im 79/109  lung]
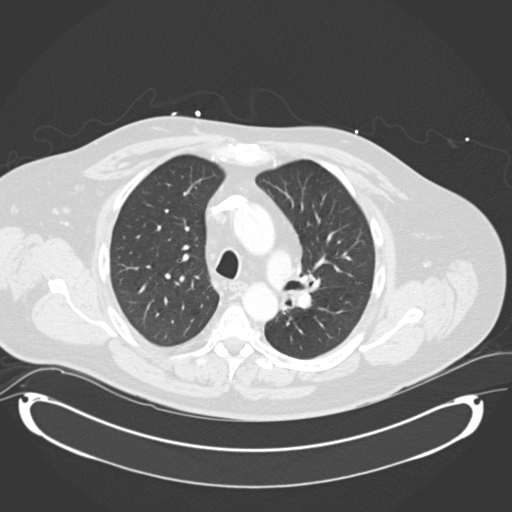
[im 89/109  mediastinal]
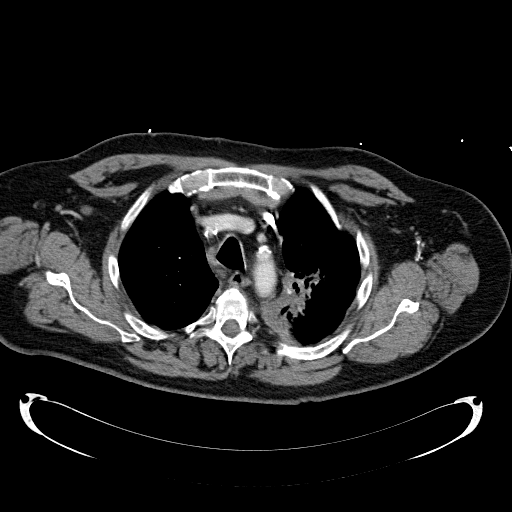
[im 89/109  lung]
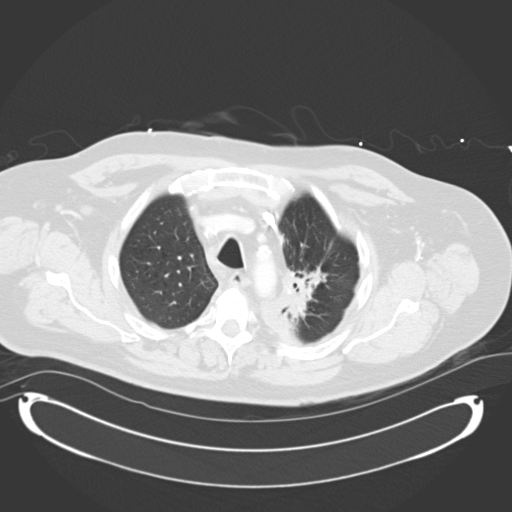
[im 99/109  lung]
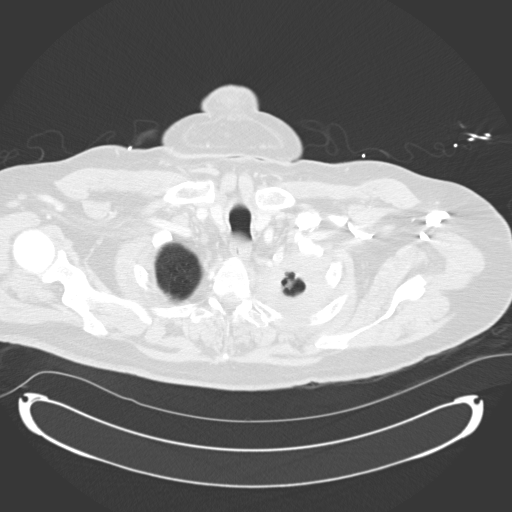

[Series 602: <mpr thick range> · coronal · 0.86mm/px · 3 of 151 slices shown]
[im 31/151  lung]
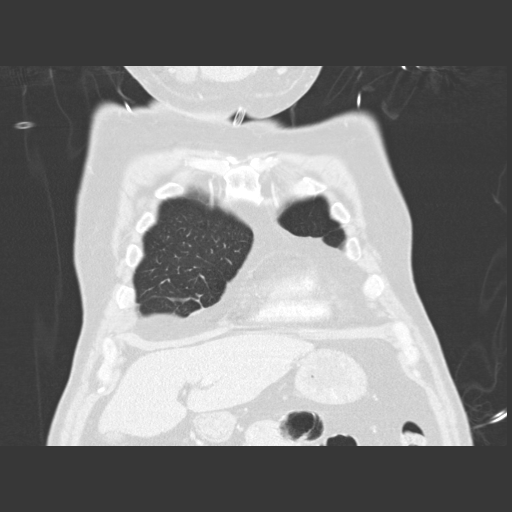
[im 61/151  lung]
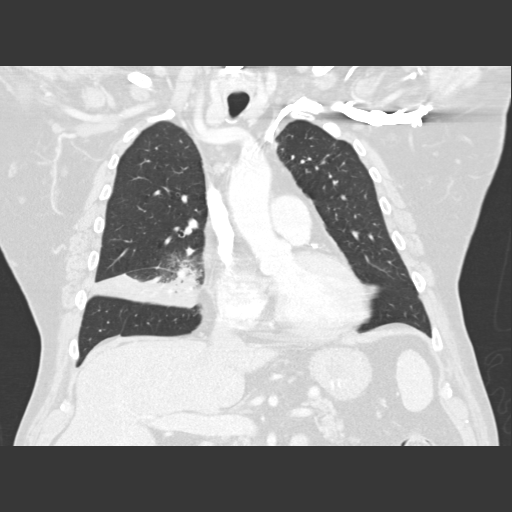
[im 91/151  lung]
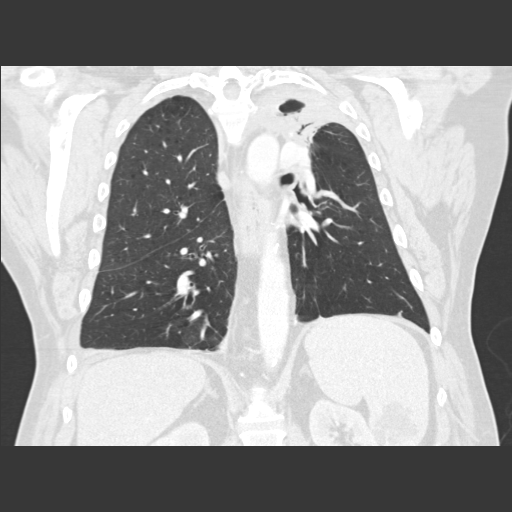

[13 of 36 positions shown; findings below may reference images not displayed]

FINDINGS: There is no evidence of pneumothorax. Right lower lobe mass
measuring 33 x 26 mm is noted which is significantly increased in
size compared to prior exam, consistent with metastatic lesion.
Stable fibrosis and scarring is noted in left lung apex most
consistent with radiation fibrosis. Coronary artery calcifications
are noted. Stable 7 mm subpleural nodule is noted laterally in left
lower lobe best seen on image number 90 of series 5. Given the lack
of change this most likely is of benign etiology, but metastatic
disease cannot be excluded. Mild right pleural effusion is noted
with adjacent subsegmental atelectasis. New consolidation is noted
in the right middle lobe consistent with pneumonia, atelectasis or
possibly fibrosis. There appears to be narrowing of the bronchi in
this area, concerning for neoplasm or malignancy. 23 x 8 mm right
peritracheal lymph node is noted which is increased compared to
prior exam and concerning for metastatic disease. 8 mm right
peritracheal lymph node is noted more inferiorly which is not
significantly changed compared to prior exam. 18 x 12 mm sub carinal
lymph node is noted which is increased compared to prior exam.
Multiple enlarged lymph nodes are noted adjacent to the descending
thoracic aorta with the largest measuring 11 x 10 mm. This is
increased compared to prior exam.

Within the visualized portion of the upper abdomen, stable cystic
lesion is noted in the spleen. No other abnormality is noted. No
significant osseous abnormality is noted.
IMPRESSION: 33 x 26 mm right lower lobe mass is noted which is significantly
increased in size compared to prior exam, consistent with worsening
metastatic lesion.

Stable probable post radiation fibrosis and scarring is noted in
left lung apex.

Coronary artery calcifications are noted suggesting coronary artery
disease.

Stable 7 mm subpleural nodule is noted laterally in left lower lobe.
This most likely is benign in etiology given the lack of change, but
metastatic disease cannot be excluded.

Mediastinal adenopathy is noted as described above, most of which is
increased in size and concerning for metastatic disease.

Mild right pleural effusion is noted with adjacent subsegmental
atelectasis.

New opacity is noted in right middle lobe most consistent with
postobstructive pneumonia or atelectasis given the narrowing of the
central bronchi in this area. This is concerning for possible
neoplasm or malignancy, and bronchoscopy is recommended for further
evaluation.
